# Patient Record
Sex: Male | Born: 1935 | Race: White | Hispanic: No | Smoking: Never smoker
Health system: Southern US, Community
[De-identification: ages and names within clinical notes are randomized; demographics above are authoritative.]

## PROBLEM LIST (undated history)

## (undated) ENCOUNTER — Emergency Department (HOSPITAL_COMMUNITY): Admission: EM | Payer: BLUE CROSS/BLUE SHIELD | Source: Home / Self Care

## (undated) DIAGNOSIS — E7800 Pure hypercholesterolemia, unspecified: Secondary | ICD-10-CM

## (undated) DIAGNOSIS — M86659 Other chronic osteomyelitis, unspecified thigh: Secondary | ICD-10-CM

## (undated) DIAGNOSIS — E119 Type 2 diabetes mellitus without complications: Secondary | ICD-10-CM

## (undated) DIAGNOSIS — H5712 Ocular pain, left eye: Secondary | ICD-10-CM

## (undated) DIAGNOSIS — M1990 Unspecified osteoarthritis, unspecified site: Secondary | ICD-10-CM

## (undated) DIAGNOSIS — T50905A Adverse effect of unspecified drugs, medicaments and biological substances, initial encounter: Secondary | ICD-10-CM

## (undated) DIAGNOSIS — E039 Hypothyroidism, unspecified: Secondary | ICD-10-CM

## (undated) DIAGNOSIS — M109 Gout, unspecified: Secondary | ICD-10-CM

## (undated) DIAGNOSIS — I4891 Unspecified atrial fibrillation: Secondary | ICD-10-CM

## (undated) DIAGNOSIS — K716 Toxic liver disease with hepatitis, not elsewhere classified: Secondary | ICD-10-CM

## (undated) DIAGNOSIS — K4090 Unilateral inguinal hernia, without obstruction or gangrene, not specified as recurrent: Secondary | ICD-10-CM

## (undated) DIAGNOSIS — F419 Anxiety disorder, unspecified: Secondary | ICD-10-CM

## (undated) DIAGNOSIS — C61 Malignant neoplasm of prostate: Secondary | ICD-10-CM

## (undated) HISTORY — DX: Pure hypercholesterolemia, unspecified: E78.00

## (undated) HISTORY — DX: Ocular pain, left eye: H57.12

## (undated) HISTORY — PX: HERNIA REPAIR: SHX51

## (undated) HISTORY — DX: Unspecified atrial fibrillation: I48.91

## (undated) HISTORY — DX: Toxic liver disease with hepatitis, not elsewhere classified: K71.6

## (undated) HISTORY — DX: Anxiety disorder, unspecified: F41.9

## (undated) HISTORY — DX: Malignant neoplasm of prostate: C61

## (undated) HISTORY — DX: Unspecified osteoarthritis, unspecified site: M19.90

## (undated) HISTORY — DX: Type 2 diabetes mellitus without complications: E11.9

## (undated) HISTORY — DX: Adverse effect of unspecified drugs, medicaments and biological substances, initial encounter: T50.905A

## (undated) HISTORY — PX: PROSTATE SURGERY: SHX751

## (undated) HISTORY — DX: Gout, unspecified: M10.9

## (undated) HISTORY — DX: Hypothyroidism, unspecified: E03.9

## (undated) HISTORY — DX: Other chronic osteomyelitis, unspecified thigh: M86.659

---

## 2001-10-17 ENCOUNTER — Ambulatory Visit (HOSPITAL_COMMUNITY): Admission: RE | Admit: 2001-10-17 | Discharge: 2001-10-17 | Payer: Self-pay | Admitting: Internal Medicine

## 2001-10-17 ENCOUNTER — Encounter: Payer: Self-pay | Admitting: Internal Medicine

## 2001-12-08 ENCOUNTER — Ambulatory Visit (HOSPITAL_COMMUNITY): Admission: RE | Admit: 2001-12-08 | Discharge: 2001-12-08 | Payer: Self-pay | Admitting: Internal Medicine

## 2003-04-29 ENCOUNTER — Ambulatory Visit (HOSPITAL_COMMUNITY): Admission: RE | Admit: 2003-04-29 | Discharge: 2003-04-29 | Payer: Self-pay | Admitting: Endocrinology

## 2007-09-26 ENCOUNTER — Ambulatory Visit (HOSPITAL_BASED_OUTPATIENT_CLINIC_OR_DEPARTMENT_OTHER): Admission: RE | Admit: 2007-09-26 | Discharge: 2007-09-26 | Payer: Self-pay | Admitting: Urology

## 2007-10-07 ENCOUNTER — Ambulatory Visit: Admission: RE | Admit: 2007-10-07 | Discharge: 2008-01-05 | Payer: Self-pay | Admitting: Radiation Oncology

## 2008-01-23 ENCOUNTER — Ambulatory Visit (HOSPITAL_BASED_OUTPATIENT_CLINIC_OR_DEPARTMENT_OTHER): Admission: RE | Admit: 2008-01-23 | Discharge: 2008-01-23 | Payer: Self-pay | Admitting: Urology

## 2008-02-05 ENCOUNTER — Ambulatory Visit: Admission: RE | Admit: 2008-02-05 | Discharge: 2008-03-14 | Payer: Self-pay | Admitting: Radiation Oncology

## 2010-01-24 ENCOUNTER — Observation Stay (HOSPITAL_COMMUNITY)
Admission: RE | Admit: 2010-01-24 | Discharge: 2010-01-25 | Payer: Self-pay | Source: Home / Self Care | Attending: General Surgery | Admitting: General Surgery

## 2010-05-02 LAB — GLUCOSE, CAPILLARY
Glucose-Capillary: 159 mg/dL — ABNORMAL HIGH (ref 70–99)
Glucose-Capillary: 212 mg/dL — ABNORMAL HIGH (ref 70–99)

## 2010-05-03 LAB — BASIC METABOLIC PANEL
CO2: 26 mEq/L (ref 19–32)
Calcium: 9.6 mg/dL (ref 8.4–10.5)
Creatinine, Ser: 0.89 mg/dL (ref 0.4–1.5)
GFR calc Af Amer: >60 mL/min (ref >60)
Glucose, Bld: 210 mg/dL — ABNORMAL HIGH (ref 70–99)

## 2010-05-03 LAB — DIFFERENTIAL
Basophils Absolute: 0 10*3/uL (ref 0.0–0.1)
Basophils Relative: 0 % (ref 0–1)
Eosinophils Absolute: 0.1 10*3/uL (ref 0.0–0.7)
Neutrophils Relative %: 72 % (ref 43–77)

## 2010-05-03 LAB — CBC
MCH: 27.9 pg (ref 26.0–34.0)
MCHC: 33 g/dL (ref 30.0–36.0)
Platelets: 250 10*3/uL (ref 150–400)
RBC: 4.12 MIL/uL — ABNORMAL LOW (ref 4.22–5.81)

## 2010-05-03 LAB — SURGICAL PCR SCREEN
MRSA, PCR: NEGATIVE
Staphylococcus aureus: POSITIVE — AB

## 2010-07-04 NOTE — Op Note (Signed)
NAME:  Anthony Dunlap, Anthony Dunlap                ACCOUNT NO.:  [id number]   MEDICAL RECORD NO.:  [id number]          PATIENT TYPE:  AMB   LOCATION:  [facility]                         FACILITY:  [facility]   PHYSICIAN:  Maretta Bees. Vonita Moss, M.D.DATE OF BIRTH:  1933-05-08   DATE OF PROCEDURE:  01/23/2008  DATE OF DISCHARGE:                               OPERATIVE REPORT   PREOPERATIVE DIAGNOSES:  Prostatic carcinoma and history of bulbous  urethral stricture.   POSTOPERATIVE DIAGNOSES:  Prostatic carcinoma and history of bulbous  urethral stricture.   PROCEDURE:  Radioactive seed implantation of prostate and cystoscopy.   SURGEON:  Maretta Bees. Vonita Moss, M.D.   ASSISTANT:  Artist Pais. Kathrynn Running, M.D.   ANESTHESIA:  General.   INDICATIONS:  This gentleman was found to have a Gleason 7 carcinoma  with a PSA of 6.63.  He was counseled about therapies and he opted for  radiation and has undergone gold seed implantation and subsequent  external beam radiation.  He now presents for radioactive seed  implantation for a combined radiotherapy.   PROCEDURE:  The patient was brought to the operating room, placed in  lithotomy position, and rectal tube, Foley catheter and transrectal  ultrasound probe and he underwent treatment planning.  After the  treatment planning was complete, he underwent placement of 26 activated  needles with a total of 78 seeds placed under ultrasonic direction.  At  the end of the case I cystoscoped him and there were 2 radioactive seeds  floating free in the bladder which were retrieved with the grasping  forceps.  There was no significant bleeding or bladder or urethral  injury.  Foley catheter was inserted and was connected to closed  drainage with clear irrigation with clear return of urine and he was  taken to the recovery room in good condition, having tolerated the  procedure well.      Maretta Bees. Vonita Moss, M.D.  Electronically Signed     LJP/MEDQ  D:  01/23/2008  T:  01/23/2008   Job:  [id number]   cc:   Artist Pais Kathrynn Running, M.D.  Fax: [phone]

## 2010-07-04 NOTE — Op Note (Signed)
NAME:  Anthony Dunlap, Anthony Dunlap                ACCOUNT NO.:  [id number]   MEDICAL RECORD NO.:  [id number]          PATIENT TYPE:  AMB   LOCATION:  [facility]                         FACILITY:  [facility]   PHYSICIAN:  Maretta Bees. Vonita Moss, M.D.DATE OF BIRTH:  06-12-1933   DATE OF PROCEDURE:  09/26/2007  DATE OF DISCHARGE:                               OPERATIVE REPORT   PREOPERATIVE DIAGNOSIS:  Microhematuria and urethral stricture.   POSTOPERATIVE DIAGNOSIS:  Microhematuria and urethral stricture.   PROCEDURE:  Cystoscopy and dilation of deep bulbous urethral stricture  with filiforms and followers.   SURGEON:  Maretta Bees. Vonita Moss, M.D.   ANESTHESIA:  General.   INDICATIONS:  This gentleman has been worked up for elevated PSA and  subsequent diagnosis of prostate cancer and also is undergoing workup  for microhematuria.  He was cystoscoped in the office and he had a dense  bulbous urethral stricture and that may well be related to a TUR of the  prostate that he had in [place], [place], 10 or 15 years ago.  He needs dilation of this stricture and cystoscopy to rule out any  bladder lesions.  He has already had an IV contrasted CT that showed no  serious abnormalities.  He did have bilateral renal cysts.   PROCEDURE IN DETAIL:  The patient was brought to the operating room and  placed in lithotomy position.  External genitalia were prepped and  draped in usual fashion.  He was cystoscoped.  The anterior urethra was  normal until it was noted very pinpoint bulbous urethral stricture in  the deep bulbous urethra.  A retractable core guidewire would not pass  easily but a Glidewire did.  Over the Glidewire I easily dilated a thin  bulbous urethral stricture from 16 to 28 [place].  I then cystoscoped him  and the stricture was wide open.  Prostate was typical evidence of  previous TUR of the prostate as noted above.  There was some tissue on  the floor from 6 o'clock to 9 o'clock that I did not  believe was  significantly obstructing.  The bladder had trabeculation but no stones,  tumors or inflammatory lesions were noted.  The bladder was emptied and  the scope removed.  The patient was sent to the recovery room in good  condition having tolerated the procedure well.      Maretta Bees. Vonita Moss, M.D.  Electronically Signed     LJP/MEDQ  D:  09/26/2007  T:  09/26/2007  Job:  [id number]

## 2010-07-07 NOTE — Op Note (Signed)
NAME:  Anthony Dunlap, Anthony Dunlap                          ACCOUNT NO.:  [id number]   MEDICAL RECORD NO.:  [id number]                   PATIENT TYPE:  AMB   LOCATION:  DAY                                  FACILITY:  [facility]   PHYSICIAN:  Lionel December, M.D.                 DATE OF BIRTH:  [birth year]   DATE OF PROCEDURE:  12/08/2001  DATE OF DISCHARGE:                                 OPERATIVE REPORT   PROCEDURE:  Total colonoscopy.   INDICATIONS:  The patient is a 77 year old Caucasian male who is undergoing  screening colonoscopy.  Family history is negative for colorectal carcinoma.  This is his first screening exam.   The procedure is reviewed with the patient, and informed consent was  obtained.   PREMEDICATION:  Demerol 50 mg IV, Versed 4 mg IV in divided dose.   ENDOSCOPE:  Olympus video system.   FINDINGS:  Procedure performed in endoscopy suite.  Patient's vital signs  and O2 saturation were monitored during the procedure and remained stable.  The patient was placed in the left lateral position and rectal examination  performed.  This exam was normal.  The scope was placed in the rectum and  advanced to the region of the sigmoid colon and beyond.  Preoperative  preparation satisfactory.  The scope was passed into the cecum, which was  identified by appendiceal orifice and ileocecal valve.  Pictures were taken  for the record.  As the scope was withdrawn, colonic mucosa was carefully  examined.  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  There was a 3-4 mm polyp at the proximal transverse colon, which was ablated  by cold biopsy.  Mucosa of the rest of the colon.  Rectal mucosa similarly  was normal.  The scope was retroflexed, examined the anorectal junction, and  hemorrhoids were noted below the dentate line.  The endoscope was  straightened and withdrawn.  The patient tolerated the procedure well.   FINAL DIAGNOSES:  1. Examination performed to the cecum.  2. Mild changes of melanosis coli.  3. Single small polyp ablated by cold biopsy  from proximal transverse     colon.  4. Small external hemorrhoids.   RECOMMENDATIONS:  1. Standard instructions given.  2.     I will be contacting the patient  with biopsy results and further     recommendations.  3. A high-fiber diet, Citrucel one tablespoonful daily, and Colace two     tablets at bedtime.  If this combination does not alleviate his     constipation, would consider starting on lactulose.                                               Lionel December, M.D.    NR/MEDQ  D:  12/08/2001  T:  12/08/2001  Job:  [id number]   cc:   Madelin Rear. Sherwood Gambler, M.D.  [address]  [place]  [place] [address]  Fax: [phone]

## 2010-11-21 LAB — COMPREHENSIVE METABOLIC PANEL
ALT: 14 U/L (ref 0–53)
CO2: 30 mEq/L (ref 19–32)
Calcium: 9.2 mg/dL (ref 8.4–10.5)
Creatinine, Ser: 0.69 mg/dL (ref 0.4–1.5)
GFR calc non Af Amer: >60 mL/min (ref >60)
Glucose, Bld: 246 mg/dL — ABNORMAL HIGH (ref 70–99)
Total Bilirubin: 0.6 mg/dL (ref 0.3–1.2)

## 2010-11-21 LAB — PROTIME-INR: Prothrombin Time: 12.6 seconds (ref 11.6–15.2)

## 2010-11-21 LAB — CBC
Hemoglobin: 12.6 g/dL — ABNORMAL LOW (ref 13.0–17.0)
MCHC: 33.3 g/dL (ref 30.0–36.0)
MCV: 86.9 fL (ref 78.0–100.0)
RBC: 4.37 MIL/uL (ref 4.22–5.81)

## 2010-11-24 LAB — GLUCOSE, CAPILLARY: Glucose-Capillary: 195 mg/dL — ABNORMAL HIGH (ref 70–99)

## 2010-12-15 ENCOUNTER — Ambulatory Visit (INDEPENDENT_AMBULATORY_CARE_PROVIDER_SITE_OTHER): Payer: Medicare Other | Admitting: Urology

## 2010-12-15 DIAGNOSIS — R351 Nocturia: Secondary | ICD-10-CM

## 2010-12-15 DIAGNOSIS — N3946 Mixed incontinence: Secondary | ICD-10-CM

## 2010-12-15 DIAGNOSIS — Z8546 Personal history of malignant neoplasm of prostate: Secondary | ICD-10-CM

## 2010-12-15 DIAGNOSIS — R81 Glycosuria: Secondary | ICD-10-CM

## 2011-07-27 ENCOUNTER — Ambulatory Visit (INDEPENDENT_AMBULATORY_CARE_PROVIDER_SITE_OTHER): Payer: Medicare Other | Admitting: Urology

## 2011-07-27 DIAGNOSIS — Z8546 Personal history of malignant neoplasm of prostate: Secondary | ICD-10-CM

## 2011-07-27 DIAGNOSIS — N3941 Urge incontinence: Secondary | ICD-10-CM

## 2011-11-28 ENCOUNTER — Encounter (INDEPENDENT_AMBULATORY_CARE_PROVIDER_SITE_OTHER): Payer: Self-pay | Admitting: *Deleted

## 2012-01-25 ENCOUNTER — Ambulatory Visit (INDEPENDENT_AMBULATORY_CARE_PROVIDER_SITE_OTHER): Payer: Medicare Other | Admitting: Urology

## 2012-01-25 DIAGNOSIS — N393 Stress incontinence (female) (male): Secondary | ICD-10-CM

## 2012-01-25 DIAGNOSIS — N3941 Urge incontinence: Secondary | ICD-10-CM

## 2012-01-25 DIAGNOSIS — Z8546 Personal history of malignant neoplasm of prostate: Secondary | ICD-10-CM

## 2012-01-25 DIAGNOSIS — N529 Male erectile dysfunction, unspecified: Secondary | ICD-10-CM

## 2013-01-30 ENCOUNTER — Ambulatory Visit: Payer: Medicare Other | Admitting: Urology

## 2013-03-06 ENCOUNTER — Ambulatory Visit (INDEPENDENT_AMBULATORY_CARE_PROVIDER_SITE_OTHER): Payer: Medicare Other | Admitting: Urology

## 2013-03-06 DIAGNOSIS — N393 Stress incontinence (female) (male): Secondary | ICD-10-CM

## 2013-03-06 DIAGNOSIS — Z8546 Personal history of malignant neoplasm of prostate: Secondary | ICD-10-CM

## 2013-03-06 DIAGNOSIS — N471 Phimosis: Secondary | ICD-10-CM

## 2013-03-06 DIAGNOSIS — N478 Other disorders of prepuce: Secondary | ICD-10-CM

## 2013-03-06 DIAGNOSIS — N529 Male erectile dysfunction, unspecified: Secondary | ICD-10-CM

## 2013-07-21 ENCOUNTER — Encounter (INDEPENDENT_AMBULATORY_CARE_PROVIDER_SITE_OTHER): Payer: Self-pay | Admitting: *Deleted

## 2013-08-04 ENCOUNTER — Encounter (INDEPENDENT_AMBULATORY_CARE_PROVIDER_SITE_OTHER): Payer: Self-pay | Admitting: *Deleted

## 2013-08-19 ENCOUNTER — Encounter (INDEPENDENT_AMBULATORY_CARE_PROVIDER_SITE_OTHER): Payer: Self-pay

## 2013-08-19 ENCOUNTER — Other Ambulatory Visit (INDEPENDENT_AMBULATORY_CARE_PROVIDER_SITE_OTHER): Payer: Self-pay

## 2013-08-19 ENCOUNTER — Encounter: Payer: Self-pay | Admitting: Neurology

## 2013-08-19 ENCOUNTER — Ambulatory Visit (INDEPENDENT_AMBULATORY_CARE_PROVIDER_SITE_OTHER): Payer: Medicare Other | Admitting: Neurology

## 2013-08-19 VITALS — BP 143/89 | HR 85 | Ht 69.0 in | Wt 151.0 lb

## 2013-08-19 DIAGNOSIS — E785 Hyperlipidemia, unspecified: Secondary | ICD-10-CM

## 2013-08-19 DIAGNOSIS — C61 Malignant neoplasm of prostate: Secondary | ICD-10-CM | POA: Insufficient documentation

## 2013-08-19 DIAGNOSIS — H4920 Sixth [abducent] nerve palsy, unspecified eye: Secondary | ICD-10-CM

## 2013-08-19 DIAGNOSIS — H4922 Sixth [abducent] nerve palsy, left eye: Secondary | ICD-10-CM | POA: Insufficient documentation

## 2013-08-19 DIAGNOSIS — Z0289 Encounter for other administrative examinations: Secondary | ICD-10-CM

## 2013-08-19 DIAGNOSIS — E119 Type 2 diabetes mellitus without complications: Secondary | ICD-10-CM | POA: Insufficient documentation
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
## 2013-08-20 ENCOUNTER — Ambulatory Visit
Admission: RE | Admit: 2013-08-20 | Discharge: 2013-08-20 | Disposition: A | Discharge status: Home / Self Care | Payer: PRIVATE HEALTH INSURANCE | Source: Ambulatory Visit | Attending: Neurology | Admitting: Neurology

## 2013-08-20 DIAGNOSIS — H5310 Unspecified subjective visual disturbances: Secondary | ICD-10-CM

## 2013-08-20 DIAGNOSIS — E785 Hyperlipidemia, unspecified: Secondary | ICD-10-CM

## 2013-08-20 DIAGNOSIS — H4922 Sixth [abducent] nerve palsy, left eye: Secondary | ICD-10-CM

## 2013-08-24 ENCOUNTER — Telehealth: Payer: Self-pay | Admitting: Neurology

## 2013-08-24 NOTE — Telephone Encounter (Signed)
Wife [name] can be reached at [phone].

## 2013-08-24 NOTE — Telephone Encounter (Signed)
Daughter returning Anthony Dunlap's call from this am, regarding MRI results.

## 2013-08-24 NOTE — Telephone Encounter (Signed)
Called pt's wife informing her of the pt's lab work results. Wife verbalized understanding.

## 2013-08-24 NOTE — Telephone Encounter (Signed)
Patient's wife returning Cathy's call, please return call, also wants to know if he can have his lab work done in [place] instead of [place].

## 2013-08-25 NOTE — Telephone Encounter (Signed)
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

## 2013-08-26 ENCOUNTER — Ambulatory Visit (INDEPENDENT_AMBULATORY_CARE_PROVIDER_SITE_OTHER): Payer: Medicare Other | Admitting: Internal Medicine

## 2013-08-26 LAB — ANA W/REFLEX IF POSITIVE: Anti Nuclear Antibody(ANA): NEGATIVE

## 2013-08-26 LAB — C-REACTIVE PROTEIN: CRP: 0.4 mg/L (ref 0.0–4.9)

## 2013-08-26 LAB — THYROID PANEL WITH TSH
Free Thyroxine Index: 2.3 (ref 1.2–4.9)
T3 Uptake Ratio: 31 % (ref 24–39)
T4, Total: 7.4 ug/dL (ref 4.5–12.0)
TSH: 3.17 u[IU]/mL (ref 0.450–4.500)

## 2013-08-26 LAB — RPR: SYPHILIS RPR SCR: NONREACTIVE

## 2013-08-26 LAB — VITAMIN B12: VITAMIN B 12: 403 pg/mL (ref 211–946)

## 2013-08-26 LAB — FOLATE: FOLATE: 18.2 ng/mL (ref >3.0)

## 2013-08-26 LAB — ACETYLCHOLINE RECEPTOR, MODULATING

## 2013-08-26 LAB — ACETYLCHOLINE RECEPTOR, BINDING: ACHR BINDING AB, SERUM: 0.03 nmol/L (ref 0.00–0.24)

## 2013-08-26 NOTE — Telephone Encounter (Signed)
Patient's wife calling back about lab work--has questions--please call.

## 2013-08-26 NOTE — Telephone Encounter (Signed)
Called pt and spoke with pt's wife Anthony Dunlap informing her per Dr. Krista Blue that the pt's MRI of the brain showed small vessel disease, no acute lesions, and that he should keep his f/u appt in August. I advised the wife that if the pt has any other problems, questions or concerns to call the office. Wife verbalized understanding.

## 2013-08-27 ENCOUNTER — Other Ambulatory Visit: Payer: Medicare Other

## 2013-08-27 ENCOUNTER — Ambulatory Visit (INDEPENDENT_AMBULATORY_CARE_PROVIDER_SITE_OTHER): Payer: Medicare Other

## 2013-08-27 DIAGNOSIS — H4922 Sixth [abducent] nerve palsy, left eye: Secondary | ICD-10-CM

## 2013-08-27 DIAGNOSIS — H5310 Unspecified subjective visual disturbances: Secondary | ICD-10-CM

## 2013-08-27 DIAGNOSIS — E785 Hyperlipidemia, unspecified: Secondary | ICD-10-CM

## 2013-09-02 ENCOUNTER — Emergency Department (HOSPITAL_COMMUNITY)
Admission: EM | Admit: 2013-09-02 | Discharge: 2013-09-03 | Disposition: A | Discharge status: Home / Self Care | Payer: Medicare Other | Attending: Emergency Medicine | Admitting: Emergency Medicine

## 2013-09-02 ENCOUNTER — Encounter (HOSPITAL_COMMUNITY): Payer: Self-pay | Admitting: Emergency Medicine

## 2013-09-02 DIAGNOSIS — R739 Hyperglycemia, unspecified: Secondary | ICD-10-CM

## 2013-09-02 DIAGNOSIS — Z7982 Long term (current) use of aspirin: Secondary | ICD-10-CM | POA: Insufficient documentation

## 2013-09-02 DIAGNOSIS — H4920 Sixth [abducent] nerve palsy, unspecified eye: Secondary | ICD-10-CM | POA: Insufficient documentation

## 2013-09-02 DIAGNOSIS — Z8546 Personal history of malignant neoplasm of prostate: Secondary | ICD-10-CM | POA: Insufficient documentation

## 2013-09-02 DIAGNOSIS — N390 Urinary tract infection, site not specified: Secondary | ICD-10-CM | POA: Insufficient documentation

## 2013-09-02 DIAGNOSIS — E119 Type 2 diabetes mellitus without complications: Secondary | ICD-10-CM | POA: Insufficient documentation

## 2013-09-02 DIAGNOSIS — Z794 Long term (current) use of insulin: Secondary | ICD-10-CM | POA: Insufficient documentation

## 2013-09-02 LAB — CBC WITH DIFFERENTIAL/PLATELET
BASOS ABS: 0 10*3/uL (ref 0.0–0.1)
Basophils Relative: 0 % (ref 0–1)
Eosinophils Absolute: 0.2 10*3/uL (ref 0.0–0.7)
Eosinophils Relative: 3 % (ref 0–5)
HCT: 35.4 % — ABNORMAL LOW (ref 39.0–52.0)
Hemoglobin: 11.8 g/dL — ABNORMAL LOW (ref 13.0–17.0)
LYMPHS ABS: 1 10*3/uL (ref 0.7–4.0)
LYMPHS PCT: 21 % (ref 12–46)
MCH: 28.9 pg (ref 26.0–34.0)
MCHC: 33.3 g/dL (ref 30.0–36.0)
MCV: 86.6 fL (ref 78.0–100.0)
Monocytes Absolute: 0.4 10*3/uL (ref 0.1–1.0)
Monocytes Relative: 9 % (ref 3–12)
NEUTROS ABS: 3.4 10*3/uL (ref 1.7–7.7)
Neutrophils Relative %: 67 % (ref 43–77)
PLATELETS: 193 10*3/uL (ref 150–400)
RBC: 4.09 MIL/uL — AB (ref 4.22–5.81)
RDW: 13 % (ref 11.5–15.5)
WBC: 5 10*3/uL (ref 4.0–10.5)

## 2013-09-02 LAB — BASIC METABOLIC PANEL
ANION GAP: 10 (ref 5–15)
BUN: 26 mg/dL — ABNORMAL HIGH (ref 6–23)
CHLORIDE: 98 meq/L (ref 96–112)
CO2: 27 meq/L (ref 19–32)
Calcium: 8.9 mg/dL (ref 8.4–10.5)
Creatinine, Ser: 1.04 mg/dL (ref 0.50–1.35)
GFR calc Af Amer: 77 mL/min — ABNORMAL LOW (ref >90)
GFR calc non Af Amer: 67 mL/min — ABNORMAL LOW (ref >90)
Glucose, Bld: 538 mg/dL — ABNORMAL HIGH (ref 70–99)
POTASSIUM: 5.2 meq/L (ref 3.7–5.3)
SODIUM: 135 meq/L — AB (ref 137–147)

## 2013-09-02 MED ORDER — SODIUM CHLORIDE 0.9 % IV SOLN: 1000.0000 mL | INTRAVENOUS | Status: DC

## 2013-09-02 MED ORDER — SODIUM CHLORIDE 0.9 % IV SOLN
1000.0000 mL | Freq: Once | INTRAVENOUS | Status: AC
  Administered 2013-09-02: 1000 mL via INTRAVENOUS

## 2013-09-02 NOTE — ED Notes (Signed)
Patient presents tonight via [facility] after his blood sugar at home read HI.  Patient denies pain or any other complaints.  States he took insulin 20 units SQ about an hour ago.  A&O; skin w/d. Respirations even and unlabored; able to speak in complete sentences without difficulty.
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
## 2013-09-02 NOTE — ED Notes (Signed)
cbg 534

## 2013-09-02 NOTE — ED Notes (Signed)
Per EMS, patient doesn't check his blood sugar or take his insulin like he's supposed to.  Patient checked CBG and it read hi so he took Insulin 20 units SQ.  Patient denies pain.

## 2013-09-03 LAB — URINALYSIS, ROUTINE W REFLEX MICROSCOPIC
Bilirubin Urine: NEGATIVE
Glucose, UA: 250 mg/dL — AB
Hgb urine dipstick: NEGATIVE
KETONES UR: NEGATIVE mg/dL
NITRITE: NEGATIVE
PH: 5.5 (ref 5.0–8.0)
Protein, ur: NEGATIVE mg/dL
Urobilinogen, UA: 0.2 mg/dL (ref 0.0–1.0)

## 2013-09-03 LAB — URINE MICROSCOPIC-ADD ON

## 2013-09-03 LAB — CBG MONITORING, ED
GLUCOSE-CAPILLARY: 534 mg/dL — AB (ref 70–99)
GLUCOSE-CAPILLARY: 386 mg/dL — AB (ref 70–99)
GLUCOSE-CAPILLARY: 199 mg/dL — AB (ref 70–99)

## 2013-09-03 MED ORDER — DEXTROSE 5 % IV SOLN
1.0000 g | Freq: Once | INTRAVENOUS | Status: AC
  Administered 2013-09-03: 1 g via INTRAVENOUS
  Filled 2013-09-03: qty 10

## 2013-09-03 MED ORDER — INSULIN ASPART 100 UNIT/ML ~~LOC~~ SOLN
7.0000 [IU] | Freq: Once | SUBCUTANEOUS | Status: AC
  Administered 2013-09-03: 7 [IU] via INTRAVENOUS
  Filled 2013-09-03: qty 1

## 2013-09-03 MED ORDER — CEPHALEXIN 500 MG PO CAPS: 500.0000 mg | ORAL_CAPSULE | Freq: Four times a day (QID) | ORAL | Status: DC

## 2013-09-03 NOTE — Discharge Instructions (Signed)
Urinary Tract Infection Urinary tract infections (UTIs) can develop anywhere along your urinary tract. Your urinary tract is your body's drainage system for removing wastes and extra water. Your urinary tract includes two kidneys, two ureters, a bladder, and a urethra. Your kidneys are a pair of bean-shaped organs. Each kidney is about the size of your fist. They are located below your ribs, one on each side of your spine. CAUSES Infections are caused by microbes, which are microscopic organisms, including fungi, viruses, and bacteria. These organisms are so small that they can only be seen through a microscope. Bacteria are the microbes that most commonly cause UTIs. SYMPTOMS  Symptoms of UTIs may vary by age and gender of the patient and by the location of the infection. Symptoms in young women typically include a frequent and intense urge to urinate and a painful, burning feeling in the bladder or urethra during urination. Older women and men are more likely to be tired, shaky, and weak and have muscle aches and abdominal pain. A fever may mean the infection is in your kidneys. Other symptoms of a kidney infection include pain in your back or sides below the ribs, nausea, and vomiting. DIAGNOSIS To diagnose a UTI, your caregiver will ask you about your symptoms. Your caregiver also will ask to provide a urine sample. The urine sample will be tested for bacteria and white blood cells. White blood cells are made by your body to help fight infection. TREATMENT  Typically, UTIs can be treated with medication. Because most UTIs are caused by a bacterial infection, they usually can be treated with the use of antibiotics. The choice of antibiotic and length of treatment depend on your symptoms and the type of bacteria causing your infection. HOME CARE INSTRUCTIONS  If you were prescribed antibiotics, take them exactly as your caregiver instructs you. Finish the medication even if you feel better after you  have only taken some of the medication.  Drink enough water and fluids to keep your urine clear or pale yellow.  Avoid caffeine, tea, and carbonated beverages. They tend to irritate your bladder.  Empty your bladder often. Avoid holding urine for long periods of time.  Empty your bladder before and after sexual intercourse.  After a bowel movement, women should cleanse from front to back. Use each tissue only once. SEEK MEDICAL CARE IF:   You have back pain.  You develop a fever.  Your symptoms do not begin to resolve within 3 days. SEEK IMMEDIATE MEDICAL CARE IF:   You have severe back pain or lower abdominal pain.  You develop chills.  You have nausea or vomiting.  You have continued burning or discomfort with urination. MAKE SURE YOU:   Understand these instructions.  Will watch your condition.  Will get help right away if you are not doing well or get worse. Document Released: 11/15/2004 Document Revised: 08/07/2011 Document Reviewed: 03/16/2011 [facility] Patient Information 2015 [place], [place]. This information is not intended to replace advice given to you by your health care provider. Make sure you discuss any questions you have with your health care provider.  Hyperglycemia Hyperglycemia occurs when the glucose (sugar) in your blood is too high. Hyperglycemia can happen for many reasons, but it most often happens to people who do not know they have diabetes or are not managing their diabetes properly.  CAUSES  Whether you have diabetes or not, there are other causes of hyperglycemia. Hyperglycemia can occur when you have diabetes, but it can also  occur in other situations that you might not be as aware of, such as: Diabetes  If you have diabetes and are having problems controlling your blood glucose, hyperglycemia could occur because of some of the following reasons:  Not following your meal plan.  Not taking your diabetes medications or not taking it  properly.  Exercising less or doing less activity than you normally do.  Being sick. Pre-diabetes  This cannot be ignored. Before people develop Type 2 diabetes, they almost always have "pre-diabetes." This is when your blood glucose levels are higher than normal, but not yet high enough to be diagnosed as diabetes. Research has shown that some long-term damage to the body, especially the heart and circulatory system, may already be occurring during pre-diabetes. If you take action to manage your blood glucose when you have pre-diabetes, you may delay or prevent Type 2 diabetes from developing. Stress  If you have diabetes, you may be "diet" controlled or on oral medications or insulin to control your diabetes. However, you may find that your blood glucose is higher than usual in the hospital whether you have diabetes or not. This is often referred to as "stress hyperglycemia." Stress can elevate your blood glucose. This happens because of hormones put out by the body during times of stress. If stress has been the cause of your high blood glucose, it can be followed regularly by your caregiver. That way he/she can make sure your hyperglycemia does not continue to get worse or progress to diabetes. Steroids  Steroids are medications that act on the infection fighting system (immune system) to block inflammation or infection. One side effect can be a rise in blood glucose. Most people can produce enough extra insulin to allow for this rise, but for those who cannot, steroids make blood glucose levels go even higher. It is not unusual for steroid treatments to "uncover" diabetes that is developing. It is not always possible to determine if the hyperglycemia will go away after the steroids are stopped. A special blood test called an A1c is sometimes done to determine if your blood glucose was elevated before the steroids were started. SYMPTOMS  Thirsty.  Frequent urination.  Dry mouth.  Blurred  vision.  Tired or fatigue.  Weakness.  Sleepy.  Tingling in feet or leg. DIAGNOSIS  Diagnosis is made by monitoring blood glucose in one or all of the following ways:  A1c test. This is a chemical found in your blood.  Fingerstick blood glucose monitoring.  Laboratory results. TREATMENT  First, knowing the cause of the hyperglycemia is important before the hyperglycemia can be treated. Treatment may include, but is not be limited to:  Education.  Change or adjustment in medications.  Change or adjustment in meal plan.  Treatment for an illness, infection, etc.  More frequent blood glucose monitoring.  Change in exercise plan.  Decreasing or stopping steroids.  Lifestyle changes. HOME CARE INSTRUCTIONS   Test your blood glucose as directed.  Exercise regularly. Your caregiver will give you instructions about exercise. Pre-diabetes or diabetes which comes on with stress is helped by exercising.  Eat wholesome, balanced meals. Eat often and at regular, fixed times. Your caregiver or nutritionist will give you a meal plan to guide your sugar intake.  Being at an ideal weight is important. If needed, losing as little as 10 to 15 pounds may help improve blood glucose levels. SEEK MEDICAL CARE IF:   You have questions about medicine, activity, or diet.  You continue  to have symptoms (problems such as increased thirst, urination, or weight gain). SEEK IMMEDIATE MEDICAL CARE IF:   You are vomiting or have diarrhea.  Your breath smells fruity.  You are breathing faster or slower.  You are very sleepy or incoherent.  You have numbness, tingling, or pain in your feet or hands.  You have chest pain.  Your symptoms get worse even though you have been following your caregiver's orders.  If you have any other questions or concerns. Document Released: 08/01/2000 Document Revised: 04/30/2011 Document Reviewed: 06/04/2011 [facility] Patient Information 2015 [place],  [place]. This information is not intended to replace advice given to you by your health care provider. Make sure you discuss any questions you have with your health care provider.  Cephalexin tablets or capsules What is this medicine? CEPHALEXIN (sef a LEX in) is a cephalosporin antibiotic. It is used to treat certain kinds of bacterial infections It will not work for colds, flu, or other viral infections. This medicine may be used for other purposes; ask your health care provider or pharmacist if you have questions. COMMON BRAND NAME(S): Biocef, Keflex, Keftab What should I tell my health care provider before I take this medicine? They need to know if you have any of these conditions: -kidney disease -stomach or intestine problems, especially colitis -an unusual or allergic reaction to cephalexin, other cephalosporins, penicillins, other antibiotics, medicines, foods, dyes or preservatives -pregnant or trying to get pregnant -breast-feeding How should I use this medicine? Take this medicine by mouth with a full glass of water. Follow the directions on the prescription label. This medicine can be taken with or without food. Take your medicine at regular intervals. Do not take your medicine more often than directed. Take all of your medicine as directed even if you think you are better. Do not skip doses or stop your medicine early. Talk to your pediatrician regarding the use of this medicine in children. While this drug may be prescribed for selected conditions, precautions do apply. Overdosage: If you think you have taken too much of this medicine contact a poison control center or emergency room at once. NOTE: This medicine is only for you. Do not share this medicine with others. What if I miss a dose? If you miss a dose, take it as soon as you can. If it is almost time for your next dose, take only that dose. Do not take double or extra doses. There should be at least 4 to 6 hours between  doses. What may interact with this medicine? -probenecid -some other antibiotics This list may not describe all possible interactions. Give your health care provider a list of all the medicines, herbs, non-prescription drugs, or dietary supplements you use. Also tell them if you smoke, drink alcohol, or use illegal drugs. Some items may interact with your medicine. What should I watch for while using this medicine? Tell your doctor or health care professional if your symptoms do not begin to improve in a few days. Do not treat diarrhea with over the counter products. Contact your doctor if you have diarrhea that lasts more than 2 days or if it is severe and watery. If you have diabetes, you may get a false-positive result for sugar in your urine. Check with your doctor or health care professional. What side effects may I notice from receiving this medicine? Side effects that you should report to your doctor or health care professional as soon as possible: -allergic reactions like skin rash, itching or  hives, swelling of the face, lips, or tongue -breathing problems -pain or trouble passing urine -redness, blistering, peeling or loosening of the skin, including inside the mouth -severe or watery diarrhea -unusually weak or tired -yellowing of the eyes, skin Side effects that usually do not require medical attention (report to your doctor or health care professional if they continue or are bothersome): -gas or heartburn -genital or anal irritation -headache -joint or muscle pain -nausea, vomiting This list may not describe all possible side effects. Call your doctor for medical advice about side effects. You may report side effects to FDA at 1-800-FDA-1088. Where should I keep my medicine? Keep out of the reach of children. Store at room temperature between 59 and 86 degrees F (15 and 30 degrees C). Throw away any unused medicine after the expiration date. NOTE: This sheet is a summary. It  may not cover all possible information. If you have questions about this medicine, talk to your doctor, pharmacist, or health care provider.  2015, [employer]. (2007-05-12 17:09:13)

## 2013-09-03 NOTE — ED Notes (Signed)
Patient continues to deny pain at this time.  Rocephin infusing without difficulty; patient to be discharged after IV antibiotic completed.

## 2013-09-04 ENCOUNTER — Telehealth: Payer: Self-pay | Admitting: Neurology

## 2013-09-04 NOTE — Telephone Encounter (Signed)
Cathy: Please call patient, ultrasound of carotid arteries showed no significant stenosis, there was evidence of atherosclerotic disease, homogeneous plaque, keep daily asa.

## 2013-09-04 NOTE — Telephone Encounter (Signed)
Pt calling requesting carotid doppler results. Please advise

## 2013-09-04 NOTE — Telephone Encounter (Signed)
Spouse requesting Carotid doppler results.  Please call and advise.  Thanks

## 2013-09-06 LAB — URINE CULTURE: Colony Count: >=100000

## 2013-09-07 ENCOUNTER — Telehealth (HOSPITAL_COMMUNITY): Payer: Self-pay

## 2013-09-07 NOTE — ED Notes (Signed)
Post ED Visit - Positive Culture Follow-up  Culture report reviewed by antimicrobial stewardship pharmacist: []  Wes Dulaney, Pharm.D., BCPS [x]  Heide Guile, Pharm.D., BCPS []  Alycia Rossetti, Pharm.D., BCPS []  [place], [place].D., BCPS, AAHIVP []  Legrand Como, Pharm.D., BCPS, AAHIVP []    Positive urine culture Treated with cephalexin, organism sensitive to the same and no further patient follow-up is required at this time.  Ileene Musa 09/07/2013, 10:43 AM

## 2013-09-07 NOTE — Telephone Encounter (Signed)
Called pt and spoke with pt's wife [name] and informed her per Dr. Krista Blue that the pt's ultrasound of carotid arteries showed no significant stenosis and there was evidence of atherosclerotic disease, homogeneous plaque and to keep daily aspirin. I advised the wife that if the pt has any other problems, questions or concerns to call the office. Wife verbalized understanding.

## 2013-09-21 ENCOUNTER — Ambulatory Visit (INDEPENDENT_AMBULATORY_CARE_PROVIDER_SITE_OTHER): Payer: Medicare Other | Admitting: Neurology

## 2013-09-21 ENCOUNTER — Encounter: Payer: Self-pay | Admitting: Neurology

## 2013-09-21 VITALS — BP 126/76 | HR 94 | Ht 69.0 in | Wt 149.0 lb

## 2013-09-21 DIAGNOSIS — E1365 Other specified diabetes mellitus with hyperglycemia: Secondary | ICD-10-CM

## 2013-09-21 DIAGNOSIS — H4920 Sixth [abducent] nerve palsy, unspecified eye: Secondary | ICD-10-CM

## 2013-09-21 DIAGNOSIS — E119 Type 2 diabetes mellitus without complications: Secondary | ICD-10-CM

## 2013-09-21 DIAGNOSIS — H4922 Sixth [abducent] nerve palsy, left eye: Secondary | ICD-10-CM

## 2013-09-21 DIAGNOSIS — E785 Hyperlipidemia, unspecified: Secondary | ICD-10-CM

## 2013-09-21 DIAGNOSIS — C61 Malignant neoplasm of prostate: Secondary | ICD-10-CM
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
## 2013-09-24 NOTE — Telephone Encounter (Signed)
Noted  

## 2014-03-19 ENCOUNTER — Ambulatory Visit (INDEPENDENT_AMBULATORY_CARE_PROVIDER_SITE_OTHER): Payer: Medicare Other | Admitting: Urology

## 2014-03-19 DIAGNOSIS — N393 Stress incontinence (female) (male): Secondary | ICD-10-CM

## 2014-03-19 DIAGNOSIS — Z8546 Personal history of malignant neoplasm of prostate: Secondary | ICD-10-CM

## 2014-03-19 DIAGNOSIS — N3941 Urge incontinence: Secondary | ICD-10-CM

## 2014-03-19 DIAGNOSIS — N5201 Erectile dysfunction due to arterial insufficiency: Secondary | ICD-10-CM

## 2014-10-19 ENCOUNTER — Ambulatory Visit (INDEPENDENT_AMBULATORY_CARE_PROVIDER_SITE_OTHER): Payer: Medicare Other | Admitting: Urology

## 2014-10-19 DIAGNOSIS — N390 Urinary tract infection, site not specified: Secondary | ICD-10-CM | POA: Diagnosis not present

## 2015-03-25 ENCOUNTER — Ambulatory Visit (INDEPENDENT_AMBULATORY_CARE_PROVIDER_SITE_OTHER): Payer: Medicare Other | Admitting: Urology

## 2015-03-25 DIAGNOSIS — N393 Stress incontinence (female) (male): Secondary | ICD-10-CM | POA: Diagnosis not present

## 2015-03-25 DIAGNOSIS — N5201 Erectile dysfunction due to arterial insufficiency: Secondary | ICD-10-CM | POA: Diagnosis not present

## 2015-03-25 DIAGNOSIS — Z8546 Personal history of malignant neoplasm of prostate: Secondary | ICD-10-CM

## 2015-03-25 DIAGNOSIS — N471 Phimosis: Secondary | ICD-10-CM

## 2015-03-25 DIAGNOSIS — N390 Urinary tract infection, site not specified: Secondary | ICD-10-CM

## 2016-04-20 ENCOUNTER — Ambulatory Visit (INDEPENDENT_AMBULATORY_CARE_PROVIDER_SITE_OTHER): Payer: Medicare Other | Admitting: Urology

## 2016-04-20 DIAGNOSIS — N3941 Urge incontinence: Secondary | ICD-10-CM | POA: Diagnosis not present

## 2016-04-20 DIAGNOSIS — N390 Urinary tract infection, site not specified: Secondary | ICD-10-CM

## 2016-04-20 DIAGNOSIS — N393 Stress incontinence (female) (male): Secondary | ICD-10-CM

## 2016-04-20 DIAGNOSIS — I10 Essential (primary) hypertension: Secondary | ICD-10-CM | POA: Diagnosis not present

## 2016-04-20 DIAGNOSIS — Z8546 Personal history of malignant neoplasm of prostate: Secondary | ICD-10-CM

## 2016-10-26 ENCOUNTER — Encounter (HOSPITAL_COMMUNITY): Payer: Self-pay | Admitting: Emergency Medicine

## 2016-10-26 ENCOUNTER — Emergency Department (HOSPITAL_COMMUNITY)
Admission: EM | Admit: 2016-10-26 | Discharge: 2016-10-26 | Disposition: A | Discharge status: Home / Self Care | Payer: Medicare Other | Attending: Emergency Medicine | Admitting: Emergency Medicine

## 2016-10-26 DIAGNOSIS — E119 Type 2 diabetes mellitus without complications: Secondary | ICD-10-CM | POA: Insufficient documentation

## 2016-10-26 DIAGNOSIS — R339 Retention of urine, unspecified: Secondary | ICD-10-CM | POA: Diagnosis present

## 2016-10-26 DIAGNOSIS — Z466 Encounter for fitting and adjustment of urinary device: Secondary | ICD-10-CM | POA: Diagnosis not present

## 2016-10-26 DIAGNOSIS — Z7982 Long term (current) use of aspirin: Secondary | ICD-10-CM | POA: Insufficient documentation

## 2016-10-26 DIAGNOSIS — Z8546 Personal history of malignant neoplasm of prostate: Secondary | ICD-10-CM | POA: Diagnosis not present

## 2016-10-26 DIAGNOSIS — N358 Other urethral stricture: Secondary | ICD-10-CM | POA: Diagnosis not present

## 2016-10-26 DIAGNOSIS — Z794 Long term (current) use of insulin: Secondary | ICD-10-CM | POA: Insufficient documentation

## 2016-10-26 DIAGNOSIS — N99111 Postprocedural bulbous urethral stricture: Secondary | ICD-10-CM | POA: Insufficient documentation

## 2016-10-26 DIAGNOSIS — R338 Other retention of urine: Secondary | ICD-10-CM | POA: Diagnosis not present

## 2016-10-26 DIAGNOSIS — N3941 Urge incontinence: Secondary | ICD-10-CM | POA: Diagnosis not present

## 2016-10-26 LAB — URINALYSIS, ROUTINE W REFLEX MICROSCOPIC
BILIRUBIN URINE: NEGATIVE
GLUCOSE, UA: 50 mg/dL — AB
KETONES UR: NEGATIVE mg/dL
LEUKOCYTES UA: NEGATIVE
NITRITE: NEGATIVE
PH: 5 (ref 5.0–8.0)
Protein, ur: NEGATIVE mg/dL
Specific Gravity, Urine: 1.015 (ref 1.005–1.030)
Squamous Epithelial / LPF: NONE SEEN

## 2016-10-26 NOTE — ED Triage Notes (Signed)
Patient c/o dysuria and retention. Per patient unable to "popperly void" x3 days-last voided yesterday at 2pm. Patient reports pressure. Patient seen at urgent care yesterday and diagnosed with UTI. Patient given antibiotic in which he got filled today. Denies any prior GU hx.

## 2016-10-26 NOTE — Discharge Instructions (Signed)
Please see Dr. Alyson Ingles at your appointment next week Continue the antibiotics that were prescribed at the office prior to coming here. ER for pain, swelling, vomiting or if the catheter stops working -  Empty the bag 3 or 4 times a day.

## 2016-10-26 NOTE — ED Notes (Signed)
Attempted with 76f coude and no success. Will notify edp

## 2016-10-26 NOTE — ED Provider Notes (Signed)
[facility] DEPT Provider Note   CSN: [id number] Arrival date & time: 10/26/16  1134     History   Chief Complaint Chief Complaint  Patient presents with  . Urinary Retention    HPI Anthony Dunlap is a 83 y.o. male.  HPI  The patient is an 83 year old male,history of prostate cancer in the past, has recently been evaluated at an urgent care as of the last 24 hours because of some urinary retention and dysuria however he reports that he has had worsening symptoms overnight and has not been able to pass much in the way of urine except for the occasional dribble but he is unable to contain or control this. He denies any bowel symptoms, denies any fevers chills and does have some lower abdominal discomfort. He does have a prior history of an appendectomy and does report a history of prostate cancer which is currently under surveillance but not active treatment. He has seen a urologist in the past  He was started on Abx at the UC yesterday  Past Medical History:  Diagnosis Date  . Diabetes ([place])   . Left eye pain   . Prostate cancer [facility])     Patient Active Problem List   Diagnosis Date Noted  . 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 Other and unspecified hyperlipidemia 08/19/2013  . Diabetes ([place])   . Prostate cancer [facility])     Past Surgical History:  Procedure Laterality Date  . HERNIA REPAIR    . PROSTATE SURGERY         Home Medications    Prior to Admission medications   Medication Sig Start Date End Date Taking? Authorizing Provider  aspirin EC 81 MG tablet Take 81 mg by mouth daily.   Yes [provider]  glyBURIDE-metformin (GLUCOVANCE) 5-500 MG tablet Take 2 tablets by mouth 2 (two) times daily. 10/01/16  Yes [provider]  LEVEMIR FLEXTOUCH 100 UNIT/ML Pen Inject 80 Units into the muscle daily. 09/28/16  Yes [provider]  cephALEXin (KEFLEX) 500 MG capsule Take 1 capsule (500 mg total) by mouth 4 (four) times  daily. Patient not taking: Reported on 06/26/8467 08/18/50   Delora Fuel, MD    Family History Family History  Problem Relation Age of Onset  . Pneumonia Father   . Cancer Sister     Social History Social History  Substance Use Topics  . Smoking status: Never Smoker  . Smokeless tobacco: Never Used  . Alcohol use No     Allergies   Patient has no known allergies.   Review of Systems Review of Systems  All other systems reviewed and are negative.    Physical Exam Updated Vital Signs BP (!) 160/99   Pulse 100   Temp 98.1 F (36.7 C) (Oral)   Resp 18   Ht 5\' 8"  (1.727 m)   Wt 73 kg (161 lb)   SpO2 96%   BMI 24.48 kg/m   Physical Exam  Constitutional: He appears well-developed and well-nourished. No distress.  HENT:  Head: Normocephalic and atraumatic.  Mouth/Throat: Oropharynx is clear and moist. No oropharyngeal exudate.  Eyes: Pupils are equal, round, and reactive to light. Conjunctivae and EOM are normal. Right eye exhibits no discharge. Left eye exhibits no discharge. No scleral icterus.  Neck: Normal range of motion. Neck supple. No JVD present. No thyromegaly present.  Cardiovascular: Regular rhythm, normal heart sounds and intact distal pulses.  Exam reveals no gallop and no friction rub.  No murmur heard. Mild tachycardia to 105 bpm, normal pulses, no edema, no JVD  Pulmonary/Chest: Effort normal and breath sounds normal. No respiratory distress. He has no wheezes. He has no rales.  Abdominal: Soft. Bowel sounds are normal. He exhibits no distension and no mass. There is tenderness ( focal tenderness to palpation in the suprapubic region with a fullness, seems to be up to the level of the umbilicus).  Genitourinary:  Genitourinary Comments: Normal appearing penis scrotum and testicles, he does have a small amount of drip incontinence from the penis, no bleeding, no discharge, no redness no swelling  Musculoskeletal: Normal range of motion. He exhibits no  edema or tenderness.  Lymphadenopathy:    He has no cervical adenopathy.  Neurological: He is alert. Coordination normal.  Skin: Skin is warm and dry. No rash noted. No erythema.  Psychiatric: He has a normal mood and affect. His behavior is normal.  Nursing note and vitals reviewed.    ED Treatments / Results  Labs (all labs ordered are listed, but only abnormal results are displayed) Labs Reviewed  URINALYSIS, ROUTINE W REFLEX MICROSCOPIC - Abnormal; Notable for the following:       Result Value   Glucose, UA 50 (*)    Hgb urine dipstick SMALL (*)    Bacteria, UA RARE (*)    All other components within normal limits  URINE CULTURE     Radiology No results found.  Procedures Procedures (including critical care time)  Medications Ordered in ED Medications - No data to display   Initial Impression / Assessment and Plan / ED Course  I have reviewed the triage vital signs and the nursing notes.  Pertinent labs & imaging results that were available during my care of the patient were reviewed by me and considered in my medical decision making (see chart for details).     Exam consistent with a likely overflow incontinence and in fact on a bedside ultrasound does reveal that he has a very enlarged bladder. This urinary retention is likely secondary to prostate problems and enlargement, we'll place Foley catheter, urinalysis, urine culture, anticipate discharge once urine sample received an process. He was placed on an antibiotic yesterday at the urgent care but has not yet started it today.  Review of the medical record shows that the patient has had a urethral stricture, I was unable to pass a urinary catheter 2 attempts, urologist was paged and thankfully able to dilate the stricture and pass a 16 Foley catheter.  Patient given return instructions, will continue on antibiotics, urinalysis here shows no infection however post-catheter placement the patient will need to be on  antibiotics according to the urologist, he is okay with what they have been prescribed yesterday at the urgent care. Patient stable for discharge  Final Clinical Impressions(s) / ED Diagnoses   Final diagnoses:  Urinary retention  Postprocedural bulbous urethral stricture    New Prescriptions New Prescriptions   No medications on file     Noemi Chapel, MD 10/26/16 1546

## 2016-10-27 NOTE — Consult Note (Signed)
Urology Consult  Referring physician: Dr. Sabra Heck Reason for referral: urinary retention, unable to place foley catheter  Chief Complaint: supapubic pain  History of Present Illness: Mr Marina Goodell is a 83yo with a hx of prostate cancer who presented to the ER with a 2 day history of urinary incontinence and an inability to urinate. He has a hx of EBRT over 15 years for prostate cancer. He has had a weak urinary stream for over 3 years. He noted worsening urgency and frequency over over the past week. Yesterday morning he awoke and could only dribble. Since then he has had constant urinary incontinence associated with severe constant sharp, nonraditing supapubic pain. No exacerbating/alleviating events. No other associated symptoms. No hematuria or dysuria. NO hx of urethral stricture disease. Multiple attempted were made in the ER to place a foley which were unsuccessful.  Past Medical History:  Diagnosis Date  . Diabetes ([place])   . Left eye pain   . Prostate cancer [facility])    Past Surgical History:  Procedure Laterality Date  . HERNIA REPAIR    . PROSTATE SURGERY      Medications: I have reviewed the patient's current medications. Allergies: No Known Allergies  Family History  Problem Relation Age of Onset  . Pneumonia Father   . Cancer Sister    Social History:  reports that he has never smoked. He has never used smokeless tobacco. He reports that he does not drink alcohol or use drugs.  Review of Systems  Gastrointestinal: Positive for abdominal pain.  Genitourinary: Positive for frequency and urgency.  All other systems reviewed and are negative.   Physical Exam:  Vital signs in last 24 hours: Pulse Rate:  [84] 84 (09/07 1639) Resp:  [18] 18 (09/07 1639) BP: (124)/(79) 124/79 (09/07 1639) SpO2:  [98 %] 98 % (09/07 1639) Physical Exam  Constitutional: He is oriented to person, place, and time. He appears well-developed and well-nourished.  HENT:  Head: Normocephalic and  atraumatic.  Eyes: Pupils are equal, round, and reactive to light. EOM are normal.  Neck: Normal range of motion. No thyromegaly present.  Cardiovascular: Normal rate and regular rhythm.   Respiratory: Effort normal. No respiratory distress.  GI: Soft. He exhibits mass. He exhibits no distension. There is tenderness. There is no rebound and no guarding. Hernia confirmed negative in the right inguinal area and confirmed negative in the left inguinal area.  Genitourinary: Testes normal and penis normal. Cremasteric reflex is present. Right testis shows no mass, no swelling and no tenderness. Right testis is descended. Cremasteric reflex is not absent on the right side. Left testis shows no mass, no swelling and no tenderness. Left testis is descended. Cremasteric reflex is not absent on the left side. Circumcised. No hypospadias or penile tenderness.  Musculoskeletal: Normal range of motion. He exhibits no edema.  Lymphadenopathy:       Right: No inguinal adenopathy present.       Left: No inguinal adenopathy present.  Neurological: He is alert and oriented to person, place, and time.  Skin: Skin is warm and dry.  Psychiatric: He has a normal mood and affect. His behavior is normal. Judgment and thought content normal.    Laboratory Data:  Results for orders placed or performed during the hospital encounter of 10/26/16 (from the past 72 hour(s))  Urinalysis, Routine w reflex microscopic     Status: Abnormal   Collection Time: 10/26/16 11:50 AM  Result Value Ref Range   Color, Urine YELLOW YELLOW  APPearance CLEAR CLEAR   Specific Gravity, Urine 1.015 1.005 - 1.030   pH 5.0 5.0 - 8.0   Glucose, UA 50 (A) NEGATIVE mg/dL   Hgb urine dipstick SMALL (A) NEGATIVE   Bilirubin Urine NEGATIVE NEGATIVE   Ketones, ur NEGATIVE NEGATIVE mg/dL   Protein, ur NEGATIVE NEGATIVE mg/dL   Nitrite NEGATIVE NEGATIVE   Leukocytes, UA NEGATIVE NEGATIVE   RBC / HPF 0-5 0 - 5 RBC/hpf   WBC, UA 0-5 0 - 5  WBC/hpf   Bacteria, UA RARE (A) NONE SEEN   Squamous Epithelial / LPF NONE SEEN NONE SEEN  Urine Culture     Status: None (Preliminary result)   Collection Time: 10/26/16 12:00 PM  Result Value Ref Range   Specimen Description URINE, CATHETERIZED    Special Requests NONE    Culture      CULTURE REINCUBATED FOR BETTER GROWTH Performed at [facility], [address] [place], [place] [address]    Report Status PENDING    Recent Results (from the past 240 hour(s))  Urine Culture     Status: None (Preliminary result)   Collection Time: 10/26/16 12:00 PM  Result Value Ref Range Status   Specimen Description URINE, CATHETERIZED  Final   Special Requests NONE  Final   Culture   Final    CULTURE REINCUBATED FOR BETTER GROWTH Performed at [facility], [address], [place], [place] [address]    Report Status PENDING  Incomplete   Creatinine: No results for input(s): CREATININE in the last 168 hours. Baseline Creatinine: unknown  Foley Catheter Placemenet--Complex  Patient was prepped and draped in the usual sterile fashion using betadine solution. 2% viscous lidocaine was inserted per urethra. A 0.038 sensor was advanced per urethra without significant resistance or recoiling. Upon passage through the bladder neck with the wire urine was noted to emanate from the urethral meatus. The filliform was then advanced into the bladder. We then placed the 12 french follower over the filliform and then advanced it into the bladder. We then withdrew the follower and increased the size to 14 french, then 16 french, then 18 french. A 16 Fr council catheter was placed via the Blitz technique and passed easily with immediate return of >1000 cc of clear, amber colored urine without clot. 10 cc sterile water was placed in the balloon port.    Impression/Assessment:  83yo with urinary retention and bulbar urethral stricture.   Plan:  1. 89 French foley placed after urethral dilation to  18 french with filliform and followers. The foley should remain in place for 1 week for a voiding trial with [facility]  Nicolette Bang 10/27/2016, 4:00 PM

## 2016-10-28 LAB — URINE CULTURE: Culture: 10000 — AB

## 2016-10-29 ENCOUNTER — Telehealth: Payer: Self-pay | Admitting: *Deleted

## 2016-10-29 NOTE — Telephone Encounter (Signed)
Post ED Visit - Positive Culture Follow-up  Culture report reviewed by antimicrobial stewardship pharmacist:  []  Elenor Quinones, Pharm.D. []  Heide Guile, Pharm.D., BCPS AQ-ID []  Parks Neptune, Pharm.D., BCPS []  Alycia Rossetti, Pharm.D., BCPS []  [place], Pharm.D., BCPS, AAHIVP []  Legrand Como, Pharm.D., BCPS, AAHIVP []  Salome Arnt, PharmD, BCPS []  Dimitri Ped, PharmD, BCPS []  Vincenza Hews, PharmD, BCPS [facility], PA-C  Positive urine culture Foley place with >1058ml obtained, no further patient follow-up is required at this time.  Harlon Flor Talley 10/29/2016, 3:12 PM

## 2016-11-02 ENCOUNTER — Ambulatory Visit (INDEPENDENT_AMBULATORY_CARE_PROVIDER_SITE_OTHER): Payer: Medicare Other | Admitting: Urology

## 2016-11-02 DIAGNOSIS — Z8546 Personal history of malignant neoplasm of prostate: Secondary | ICD-10-CM | POA: Diagnosis not present

## 2016-11-24 ENCOUNTER — Emergency Department (HOSPITAL_COMMUNITY)
Admission: EM | Admit: 2016-11-24 | Discharge: 2016-11-24 | Disposition: A | Discharge status: Home / Self Care | Payer: Medicare Other | Attending: Emergency Medicine | Admitting: Emergency Medicine

## 2016-11-24 ENCOUNTER — Encounter (HOSPITAL_COMMUNITY): Payer: Self-pay | Admitting: Emergency Medicine

## 2016-11-24 DIAGNOSIS — N99110 Postprocedural urethral stricture, male, meatal: Secondary | ICD-10-CM | POA: Diagnosis not present

## 2016-11-24 DIAGNOSIS — Z7984 Long term (current) use of oral hypoglycemic drugs: Secondary | ICD-10-CM | POA: Diagnosis not present

## 2016-11-24 DIAGNOSIS — R339 Retention of urine, unspecified: Secondary | ICD-10-CM | POA: Diagnosis not present

## 2016-11-24 DIAGNOSIS — R109 Unspecified abdominal pain: Secondary | ICD-10-CM | POA: Diagnosis present

## 2016-11-24 DIAGNOSIS — E119 Type 2 diabetes mellitus without complications: Secondary | ICD-10-CM | POA: Insufficient documentation

## 2016-11-24 DIAGNOSIS — Z8546 Personal history of malignant neoplasm of prostate: Secondary | ICD-10-CM | POA: Insufficient documentation

## 2016-11-24 DIAGNOSIS — R338 Other retention of urine: Secondary | ICD-10-CM

## 2016-11-24 DIAGNOSIS — Z79899 Other long term (current) drug therapy: Secondary | ICD-10-CM | POA: Diagnosis not present

## 2016-11-24 DIAGNOSIS — Z7982 Long term (current) use of aspirin: Secondary | ICD-10-CM | POA: Insufficient documentation

## 2016-11-24 LAB — BASIC METABOLIC PANEL
ANION GAP: 9 (ref 5–15)
BUN: 20 mg/dL (ref 6–20)
CO2: 25 mmol/L (ref 22–32)
Calcium: 9.2 mg/dL (ref 8.9–10.3)
Chloride: 107 mmol/L (ref 101–111)
Creatinine, Ser: 0.76 mg/dL (ref 0.61–1.24)
GFR calc Af Amer: >60 mL/min (ref >60)
GFR calc non Af Amer: >60 mL/min (ref >60)
GLUCOSE: 134 mg/dL — AB (ref 65–99)
Potassium: 4.1 mmol/L (ref 3.5–5.1)
Sodium: 141 mmol/L (ref 135–145)

## 2016-11-24 LAB — CBC WITH DIFFERENTIAL/PLATELET
BASOS ABS: 0 10*3/uL (ref 0.0–0.1)
Basophils Relative: 0 %
Eosinophils Absolute: 0.2 10*3/uL (ref 0.0–0.7)
Eosinophils Relative: 2 %
HCT: 36.6 % — ABNORMAL LOW (ref 39.0–52.0)
HEMOGLOBIN: 12.1 g/dL — AB (ref 13.0–17.0)
Lymphocytes Relative: 15 %
Lymphs Abs: 1.1 10*3/uL (ref 0.7–4.0)
MCH: 28.3 pg (ref 26.0–34.0)
MCHC: 33.1 g/dL (ref 30.0–36.0)
MCV: 85.7 fL (ref 78.0–100.0)
MONO ABS: 0.6 10*3/uL (ref 0.1–1.0)
Monocytes Relative: 9 %
Neutro Abs: 5.2 10*3/uL (ref 1.7–7.7)
Neutrophils Relative %: 74 %
Platelets: 237 10*3/uL (ref 150–400)
RBC: 4.27 MIL/uL (ref 4.22–5.81)
RDW: 13.1 % (ref 11.5–15.5)
WBC: 7.1 10*3/uL (ref 4.0–10.5)

## 2016-11-24 MED ORDER — CIPROFLOXACIN HCL 500 MG PO TABS: 500.0000 mg | ORAL_TABLET | Freq: Two times a day (BID) | ORAL | 0 refills | Status: DC

## 2016-11-24 MED ORDER — CIPROFLOXACIN HCL 250 MG PO TABS
500.0000 mg | ORAL_TABLET | Freq: Once | ORAL | Status: AC
  Administered 2016-11-24: 500 mg via ORAL
  Filled 2016-11-24: qty 2

## 2016-11-24 NOTE — Discharge Instructions (Signed)
Take the antibiotic twice a day until gone. Call [facility] to get an appointment in 1-2 weeks. Recheck if the catheter stops draining or you get a fever or vomiting.

## 2016-11-24 NOTE — ED Provider Notes (Signed)
[facility] DEPT Provider Note   CSN: [id number] Arrival date & time: 11/24/16  0331  Time seen 03:51 AM   History   Chief Complaint Chief Complaint  Patient presents with  . Urinary Retention    HPI Anthony Dunlap is a 83 y.o. male.  HPI  patient has a history of acute urinary retention, he was seen on September 7 and nursing staff was unable to get a Foley catheter passed. Urology had to come and use a wire and do dilatation. Patient states his catheter was removed about 2 or 3 weeks ago. He states he's been dribbling all week, and then tonight he started having abdominal pain. He denies any nausea or vomiting.  PCP [name], MD Urology Dr Alyson Ingles  Past Medical History:  Diagnosis Date  . Diabetes ([place])   . Left eye pain   . Prostate cancer [facility])     Patient Active Problem List   Diagnosis Date Noted  . 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 Other and unspecified hyperlipidemia 08/19/2013  . Diabetes ([place])   . Prostate cancer [facility])     Past Surgical History:  Procedure Laterality Date  . HERNIA REPAIR    . PROSTATE SURGERY         Home Medications    Prior to Admission medications   Medication Sig Start Date End Date Taking? Authorizing Provider  aspirin EC 81 MG tablet Take 81 mg by mouth daily.   Yes [provider]  glyBURIDE-metformin (GLUCOVANCE) 5-500 MG tablet Take 2 tablets by mouth 2 (two) times daily. 10/01/16  Yes [provider]  LEVEMIR FLEXTOUCH 100 UNIT/ML Pen Inject 80 Units into the muscle daily. 09/28/16  Yes [provider]  cephALEXin (KEFLEX) 500 MG capsule Take 1 capsule (500 mg total) by mouth 4 (four) times daily. Patient not taking: Reported on 7/0/0174 9/44/96   Delora Fuel, MD  ciprofloxacin (CIPRO) 500 MG tablet Take 1 tablet (500 mg total) by mouth 2 (two) times daily. 11/24/16   Rolland Porter, MD    Family History Family History  Problem Relation Age of Onset  . Pneumonia Father   .  Cancer Sister     Social History Social History  Substance Use Topics  . Smoking status: Never Smoker  . Smokeless tobacco: Never Used  . Alcohol use No     Allergies   Patient has no known allergies.   Review of Systems Review of Systems  All other systems reviewed and are negative.    Physical Exam Updated Vital Signs BP (!) 187/114 (BP Location: Left Arm)   Pulse (!) 107   Temp 97.8 F (36.6 C) (Oral)   Resp 18   Ht 5\' 8"  (1.727 m)   Wt 73 kg (161 lb)   SpO2 98%   BMI 24.48 kg/m   Vital signs normal except for hypertension and tachycardia   Physical Exam  Constitutional: He is oriented to person, place, and time.  Elderly male who appears uncomfortable  HENT:  Head: Normocephalic and atraumatic.  Right Ear: External ear normal.  Left Ear: External ear normal.  Nose: Nose normal.  Eyes: Conjunctivae and EOM are normal.  Neck: Normal range of motion.  Pulmonary/Chest: Effort normal. He has no wheezes.  Abdominal: He exhibits distension. There is tenderness.  Genitourinary: Penis normal.  Musculoskeletal: Normal range of motion. He exhibits no deformity.  Neurological: He is alert and oriented to person, place, and time. No cranial nerve deficit.  Skin: Skin is warm and dry.  Psychiatric: He has a normal mood and affect. His behavior is normal. Thought content normal.  Nursing note and vitals reviewed.    ED Treatments / Results  Labs (all labs ordered are listed, but only abnormal results are displayed) Results for orders placed or performed during the hospital encounter of 62/69/48  Basic metabolic panel  Result Value Ref Range   Sodium 141 135 - 145 mmol/L   Potassium 4.1 3.5 - 5.1 mmol/L   Chloride 107 101 - 111 mmol/L   CO2 25 22 - 32 mmol/L   Glucose, Bld 134 (H) 65 - 99 mg/dL   BUN 20 6 - 20 mg/dL   Creatinine, Ser 0.76 0.61 - 1.24 mg/dL   Calcium 9.2 8.9 - 10.3 mg/dL   GFR calc non Af Amer >60 >60 mL/min   GFR calc Af Amer >60 >60  mL/min   Anion gap 9 5 - 15  CBC with Differential  Result Value Ref Range   WBC 7.1 4.0 - 10.5 K/uL   RBC 4.27 4.22 - 5.81 MIL/uL   Hemoglobin 12.1 (L) 13.0 - 17.0 g/dL   HCT 36.6 (L) 39.0 - 52.0 %   MCV 85.7 78.0 - 100.0 fL   MCH 28.3 26.0 - 34.0 pg   MCHC 33.1 30.0 - 36.0 g/dL   RDW 13.1 11.5 - 15.5 %   Platelets 237 150 - 400 K/uL   Neutrophils Relative % 74 %   Neutro Abs 5.2 1.7 - 7.7 K/uL   Lymphocytes Relative 15 %   Lymphs Abs 1.1 0.7 - 4.0 K/uL   Monocytes Relative 9 %   Monocytes Absolute 0.6 0.1 - 1.0 K/uL   Eosinophils Relative 2 %   Eosinophils Absolute 0.2 0.0 - 0.7 K/uL   Basophils Relative 0 %   Basophils Absolute 0.0 0.0 - 0.1 K/uL   Laboratory interpretation all normal except mild anemia    EKG  EKG Interpretation None       Radiology No results found.  Procedures Procedures (including critical care time)  Medications Ordered in ED Medications  ciprofloxacin (CIPRO) tablet 500 mg (not administered)     Initial Impression / Assessment and Plan / ED Course  I have reviewed the triage vital signs and the nursing notes.  Pertinent labs & imaging results that were available during my care of the patient were reviewed by me and considered in my medical decision making (see chart for details).    Bladder scan reveals over 900  mL urine.  Nursing staff tried to pass a #16 coud catheter without success. The house supervisor was called to get a #14.  04:45 AM unable to pass #14 foley. Will consult Urology. Pt did start having some dribbling after manipulation and had out about 350 cc of urine.   05:02 AM [name], Urology will come see patient.   06:20 AM [name] has placed a foley. Asks to start patient on cipro x 3 days and f/u in 1-2 weeks in the office.   Review of visit on October 26, 2016 Foley Catheter Placemenet--Complex  Patient was prepped and draped in the usual sterile fashion using betadine solution. 2% viscous lidocaine was  inserted per urethra. A 0.038 sensor was advanced per urethra without significant resistance or recoiling. Upon passage through the bladder neck with the wire urine was noted to emanate from the urethral meatus. The filliform was then advanced into the bladder. We then placed the 12  french follower over the filliform and then advanced it into the bladder. We then withdrew the follower and increased the size to 14 french, then 16 french, then 18 french. A 16 Fr council catheter was placed via the Blitz technique and passed easily with immediate return of >1000 cc of clear, amber colored urine without clot. 10 cc sterile water was placed in the balloon port.    Impression/Assessment:  83yo with urinary retention and bulbar urethral stricture.   Plan:  1. 55 French foley placed after urethral dilation to 18 french with filliform and followers. The foley should remain in place for 1 week for a voiding trial with [facility] Nicolette Bang  Final Clinical Impressions(s) / ED Diagnoses   Final diagnoses:  Urinary retention    New Prescriptions New Prescriptions   CIPROFLOXACIN (CIPRO) 500 MG TABLET    Take 1 tablet (500 mg total) by mouth 2 (two) times daily.    Plan discharge  Rolland Porter, MD, Barbette Or, MD 11/24/16 [phone]

## 2016-11-24 NOTE — ED Triage Notes (Signed)
Pt c/o urinary retention that started this am.

## 2016-11-24 NOTE — Consult Note (Addendum)
H&P Physician requesting consult: Rolland Porter, MD  Chief Complaint: urinary retention  History of Present Illness: 83yo with a hx of prostate cancer who presented to the ER with urinary dribbling and an inability to urinate. He has a hx of EBRT over 15 years for prostate cancer. He has had a weak urinary stream for over 3 years. he has had severe supapubic pain. No exacerbating/alleviating events. No other associated symptoms. No hematuria or dysuria.Multiple attempted were made in the ER to place a foley which were unsuccessful. This also occurred back on 10/27/2016 and Dr. Alyson Ingles diluted his urethra to 84 [place] and placed a 16 French catheter. The patient recently passed a voiding trial about a week later.  Past Medical History:  Diagnosis Date  . Diabetes ([place])   . Left eye pain   . Prostate cancer [facility])    Past Surgical History:  Procedure Laterality Date  . HERNIA REPAIR    . PROSTATE SURGERY      Home Medications:   (Not in a hospital admission) Allergies: No Known Allergies  Family History  Problem Relation Age of Onset  . Pneumonia Father   . Cancer Sister    Social History:  reports that he has never smoked. He has never used smokeless tobacco. He reports that he does not drink alcohol or use drugs.  ROS: A complete review of systems was performed.  All systems are negative except for pertinent findings as noted. ROS   Physical Exam:  Vital signs in last 24 hours: Temp:  [97.8 F (36.6 C)] 97.8 F (36.6 C) (10/06 0342) Pulse Rate:  [107] 107 (10/06 0342) Resp:  [18] 18 (10/06 0342) BP: (187)/(114) 187/114 (10/06 0342) SpO2:  [98 %] 98 % (10/06 0342) Weight:  [73 kg (161 lb)] 73 kg (161 lb) (10/06 0338) General:  Alert and oriented, No acute distress HEENT: Normocephalic, atraumatic Neck: No JVD or lymphadenopathy Cardiovascular: Regular rate and rhythm Lungs: Regular rate and effort Abdomen: Soft, nontender, nondistended, no abdominal masses, old surgical  scars GU: scrotum testis and penis WNL Back: No CVA tenderness Extremities: No edema Neurologic: Grossly intact  Laboratory Data:  Results for orders placed or performed during the hospital encounter of 11/24/16 (from the past 24 hour(s))  Basic metabolic panel     Status: Abnormal   Collection Time: 11/24/16  5:31 AM  Result Value Ref Range   Sodium 141 135 - 145 mmol/L   Potassium 4.1 3.5 - 5.1 mmol/L   Chloride 107 101 - 111 mmol/L   CO2 25 22 - 32 mmol/L   Glucose, Bld 134 (H) 65 - 99 mg/dL   BUN 20 6 - 20 mg/dL   Creatinine, Ser 0.76 0.61 - 1.24 mg/dL   Calcium 9.2 8.9 - 10.3 mg/dL   GFR calc non Af Amer >60 >60 mL/min   GFR calc Af Amer >60 >60 mL/min   Anion gap 9 5 - 15  CBC with Differential     Status: Abnormal   Collection Time: 11/24/16  5:31 AM  Result Value Ref Range   WBC 7.1 4.0 - 10.5 K/uL   RBC 4.27 4.22 - 5.81 MIL/uL   Hemoglobin 12.1 (L) 13.0 - 17.0 g/dL   HCT 36.6 (L) 39.0 - 52.0 %   MCV 85.7 78.0 - 100.0 fL   MCH 28.3 26.0 - 34.0 pg   MCHC 33.1 30.0 - 36.0 g/dL   RDW 13.1 11.5 - 15.5 %   Platelets 237 150 - 400 K/uL  Neutrophils Relative % 74 %   Neutro Abs 5.2 1.7 - 7.7 K/uL   Lymphocytes Relative 15 %   Lymphs Abs 1.1 0.7 - 4.0 K/uL   Monocytes Relative 9 %   Monocytes Absolute 0.6 0.1 - 1.0 K/uL   Eosinophils Relative 2 %   Eosinophils Absolute 0.2 0.0 - 0.7 K/uL   Basophils Relative 0 %   Basophils Absolute 0.0 0.0 - 0.1 K/uL   No results found for this or any previous visit (from the past 240 hour(s)). Creatinine:  Recent Labs  11/24/16 0531  CREATININE 0.76    Urethral dilation with Foley Catheter Placemenet--Complex  Patient was prepped and draped in the usual sterile fashion using betadine solution.  A 0.038 sensor was advanced per urethra without significant resistance or recoiling. Upon passage through the bladder neck with the wire small amount of urine was noted to exit from the urethral meatus. I then sequentially dilated  over the wire from 10 [place], 12 French,14 french,16 french, then 18 french. A 14 Fr silicone catheter was placed easily with immediate return of >1000 cc of clear urine without clot. 10 cc sterile water was placed in the balloon port. The catheter was secured to the patient's left leg.  Impression/Assessment:  Urinary retention with recurrent bulbar urethral stricture  Plan:  He will keep the catheter for 1-2 weeks and follow up in clinic. We'll decide at that point for voiding trial versus DVIU given the quick recurrence. Recommend 3 days of prophylactic antibiotic given all the instrumentation.  Marton Redwood, III 11/24/2016, 6:24 AM

## 2016-12-13 ENCOUNTER — Emergency Department (HOSPITAL_COMMUNITY)
Admission: EM | Admit: 2016-12-13 | Discharge: 2016-12-13 | Disposition: A | Discharge status: Home / Self Care | Payer: Medicare Other | Attending: Emergency Medicine | Admitting: Emergency Medicine

## 2016-12-13 ENCOUNTER — Encounter (HOSPITAL_COMMUNITY): Payer: Self-pay | Admitting: Emergency Medicine

## 2016-12-13 DIAGNOSIS — E119 Type 2 diabetes mellitus without complications: Secondary | ICD-10-CM | POA: Diagnosis not present

## 2016-12-13 DIAGNOSIS — Z794 Long term (current) use of insulin: Secondary | ICD-10-CM | POA: Insufficient documentation

## 2016-12-13 DIAGNOSIS — Z8546 Personal history of malignant neoplasm of prostate: Secondary | ICD-10-CM | POA: Insufficient documentation

## 2016-12-13 DIAGNOSIS — Z7982 Long term (current) use of aspirin: Secondary | ICD-10-CM | POA: Insufficient documentation

## 2016-12-13 DIAGNOSIS — R339 Retention of urine, unspecified: Secondary | ICD-10-CM | POA: Insufficient documentation

## 2016-12-13 LAB — URINALYSIS, ROUTINE W REFLEX MICROSCOPIC
BACTERIA UA: NONE SEEN
Bilirubin Urine: NEGATIVE
Glucose, UA: >=500 mg/dL — AB
Hgb urine dipstick: NEGATIVE
Ketones, ur: NEGATIVE mg/dL
Nitrite: NEGATIVE
Protein, ur: NEGATIVE mg/dL
Specific Gravity, Urine: 1.008 (ref 1.005–1.030)
pH: 5 (ref 5.0–8.0)

## 2016-12-13 NOTE — ED Triage Notes (Signed)
Patient complains of urinary retention. Had foley cath in place but was taken out Tuesday 12/11/16.  He says he has not voided since it was taken out.

## 2016-12-13 NOTE — Discharge Instructions (Signed)
Keep catheter in place.  Empty leg bag 1 full.  Call for additional follow-up with urology let him know we had to reinsert the catheter.  Urine culture was done and is pending.

## 2016-12-13 NOTE — ED Provider Notes (Signed)
[facility] EMERGENCY DEPARTMENT Provider Note   CSN: [id number] Arrival date & time: 12/13/16  7829     History   Chief Complaint Chief Complaint  Patient presents with  . Urinary Retention    HPI Anthony Dunlap is a 83 y.o. male.  Patient having difficulty with urinary retention since September.  Patient had a second Foley catheter placed and removed on October 23.  Followed by Dr[facility] Glorian[facility] Loan from [facility].  They removed the catheter.  Patient has not urinated since the catheter came out.  Patient states that his abdominal discomfort and distention but no other complaints.      Past Medical History:  Diagnosis Date  . Diabetes ([place])   . Left eye pain   . Prostate cancer [facility])     Patient Active Problem List   Diagnosis Date Noted  . 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 Other and unspecified hyperlipidemia 08/19/2013  . Diabetes ([place])   . Prostate cancer [facility])     Past Surgical History:  Procedure Laterality Date  . HERNIA REPAIR    . PROSTATE SURGERY         Home Medications    Prior to Admission medications   Medication Sig Start Date End Date Taking? Authorizing Provider  aspirin EC 81 MG tablet Take 81 mg by mouth daily.    [provider]  cephALEXin (KEFLEX) 500 MG capsule Take 1 capsule (500 mg total) by mouth 4 (four) times daily. Patient not taking: Reported on 06/24/2128 8/65/78   Delora Fuel, MD  ciprofloxacin (CIPRO) 500 MG tablet Take 1 tablet (500 mg total) by mouth 2 (two) times daily. 11/24/16   Rolland Porter, MD  glyBURIDE-metformin (GLUCOVANCE) 5-500 MG tablet Take 2 tablets by mouth 2 (two) times daily. 10/01/16   [provider]  LEVEMIR FLEXTOUCH 100 UNIT/ML Pen Inject 80 Units into the muscle daily. 09/28/16   [provider]    Family History Family History  Problem Relation Age of Onset  . Pneumonia Father   . Cancer Sister     Social History Social History  Substance Use Topics  . Smoking  status: Never Smoker  . Smokeless tobacco: Never Used  . Alcohol use No     Allergies   Patient has no known allergies.   Review of Systems Review of Systems  Constitutional: Negative for fever.  HENT: Negative for congestion.   Eyes: Negative for visual disturbance.  Respiratory: Negative for shortness of breath.   Cardiovascular: Negative for chest pain.  Gastrointestinal: Positive for abdominal pain.  Genitourinary: Positive for difficulty urinating. Negative for dysuria.  Musculoskeletal: Negative for back pain.  Skin: Negative for rash.  Neurological: Negative for headaches.  Hematological: Does not bruise/bleed easily.  Psychiatric/Behavioral: Negative for confusion.     Physical Exam Updated Vital Signs BP (!) 148/91   Pulse 81   Temp 97.6 F (36.4 C) (Oral)   Resp 18   Ht 1.727 m (5\' 8" )   Wt 73 kg (161 lb)   SpO2 100%   BMI 24.48 kg/m   Physical Exam  Constitutional: He is oriented to person, place, and time. He appears well-developed and well-nourished. No distress.  HENT:  Head: Normocephalic and atraumatic.  Mouth/Throat: Oropharynx is clear and moist.  Eyes: Pupils are equal, round, and reactive to light. Conjunctivae and EOM are normal.  Neck: Normal range of motion. Neck supple.  Pulmonary/Chest: Effort normal and breath sounds normal. No respiratory distress.  Abdominal:  Soft. Bowel sounds are normal. There is no tenderness.  Bladder palpable in the suprapubic area.  Appears to be significantly enlarged.  Musculoskeletal: Normal range of motion.  Neurological: He is alert and oriented to person, place, and time. No cranial nerve deficit. He exhibits normal muscle tone. Coordination normal.  Skin: Skin is warm.  Nursing note and vitals reviewed.    ED Treatments / Results  Labs (all labs ordered are listed, but only abnormal results are displayed) Labs Reviewed  URINALYSIS, ROUTINE W REFLEX MICROSCOPIC - Abnormal; Notable for the following:        Result Value   Glucose, UA >=500 (*)    Leukocytes, UA TRACE (*)    Squamous Epithelial / LPF 0-5 (*)    All other components within normal limits  URINE CULTURE    EKG  EKG Interpretation None       Radiology No results found.  Procedures Procedures (including critical care time)  Medications Ordered in ED Medications - No data to display   Initial Impression / Assessment and Plan / ED Course  I have reviewed the triage vital signs and the nursing notes.  Pertinent labs & imaging results that were available during my care of the patient were reviewed by me and considered in my medical decision making (see chart for details).     Patient had bladder scan done which showed about 900 cc of urine.  Catheter was placed by nursing.  Clear urine.  Urine sent for culture.  Patient was significant improvement.  Switched over to leg bag.  Catheter will need to probably stay in place until urology can actually do a scope to correct the problem.  We will have him follow back up with Dr. Gloriann Loan of [facility].  Patient was significant relief after placement of the catheter.  Final Clinical Impressions(s) / ED Diagnoses   Final diagnoses:  Urinary retention    New Prescriptions New Prescriptions   No medications on file     Fredia Sorrow, MD 12/13/16 1616

## 2016-12-16 LAB — URINE CULTURE
Culture: >=100000 — AB
SPECIAL REQUESTS: NORMAL

## 2016-12-17 ENCOUNTER — Telehealth: Payer: Self-pay | Admitting: Emergency Medicine

## 2016-12-17 NOTE — Progress Notes (Signed)
ED Antimicrobial Stewardship Positive Culture Follow Up   Anthony Dunlap is an 83 y.o. male who presented to [facility] on 12/13/2016 with a chief complaint of  Chief Complaint  Patient presents with  . Urinary Retention    Recent Results (from the past 720 hour(s))  Urine culture     Status: Abnormal   Collection Time: 12/13/16 10:29 AM  Result Value Ref Range Status   Specimen Description URINE, CATHETERIZED  Final   Special Requests Normal  Final   Culture >=100,000 COLONIES/mL ENTEROCOCCUS FAECALIS (A)  Final   Report Status 12/16/2016 FINAL  Final   Organism ID, Bacteria ENTEROCOCCUS FAECALIS (A)  Final      Susceptibility   Enterococcus faecalis - MIC*    AMPICILLIN <=2 SENSITIVE Sensitive     LEVOFLOXACIN >=8 RESISTANT Resistant     NITROFURANTOIN <=16 SENSITIVE Sensitive     VANCOMYCIN 1 SENSITIVE Sensitive     * >=100,000 COLONIES/mL ENTEROCOCCUS FAECALIS   Call results to [facility] for treatment decision  ED Provider: Gifford Shave 12/17/2016, 9:03 AM Infectious Diseases Pharmacist Phone# [phone]

## 2016-12-17 NOTE — Telephone Encounter (Signed)
Post ED Visit - Positive Culture Follow-up  Culture report reviewed by antimicrobial stewardship pharmacist:  []  Elenor Quinones, Pharm.D. []  Heide Guile, Pharm.D., BCPS AQ-ID [x]  Parks Neptune, Pharm.D., BCPS []  Alycia Rossetti, Pharm.D., BCPS []  [place], Pharm.D., BCPS, AAHIVP []  Legrand Como, Pharm.D., BCPS, AAHIVP []  Salome Arnt, PharmD, BCPS []  Dimitri Ped, PharmD, BCPS []  Vincenza Hews, PharmD, BCPS  Positive urine culture Treated with none,results faxed to Dr. Gloriann Loan @ [facility] @ [phone], no further patient follow-up is required at this time.  Hazle Nordmann 12/17/2016, 12:57 PM

## 2017-05-17 ENCOUNTER — Ambulatory Visit (INDEPENDENT_AMBULATORY_CARE_PROVIDER_SITE_OTHER): Payer: Medicare Other | Admitting: Urology

## 2017-05-17 DIAGNOSIS — Z8546 Personal history of malignant neoplasm of prostate: Secondary | ICD-10-CM

## 2017-05-17 DIAGNOSIS — R338 Other retention of urine: Secondary | ICD-10-CM

## 2017-06-13 ENCOUNTER — Ambulatory Visit (INDEPENDENT_AMBULATORY_CARE_PROVIDER_SITE_OTHER): Payer: Self-pay | Admitting: Orthopaedic Surgery

## 2017-06-14 ENCOUNTER — Ambulatory Visit (INDEPENDENT_AMBULATORY_CARE_PROVIDER_SITE_OTHER): Payer: Medicare Other | Admitting: Urology

## 2017-06-14 DIAGNOSIS — R338 Other retention of urine: Secondary | ICD-10-CM

## 2017-06-19 ENCOUNTER — Ambulatory Visit (INDEPENDENT_AMBULATORY_CARE_PROVIDER_SITE_OTHER): Payer: Self-pay

## 2017-06-19 ENCOUNTER — Ambulatory Visit (INDEPENDENT_AMBULATORY_CARE_PROVIDER_SITE_OTHER): Payer: Medicare Other | Admitting: Orthopaedic Surgery

## 2017-06-19 ENCOUNTER — Encounter (INDEPENDENT_AMBULATORY_CARE_PROVIDER_SITE_OTHER): Payer: Self-pay | Admitting: Orthopaedic Surgery

## 2017-06-19 VITALS — BP 148/88 | HR 102 | Resp 18 | Ht 68.0 in | Wt 160.0 lb

## 2017-06-19 DIAGNOSIS — M5441 Lumbago with sciatica, right side: Secondary | ICD-10-CM

## 2017-06-19 DIAGNOSIS — G8929 Other chronic pain: Secondary | ICD-10-CM

## 2017-06-19 DIAGNOSIS — M25561 Pain in right knee: Secondary | ICD-10-CM

## 2017-06-19 MED ORDER — BUPIVACAINE HCL 0.5 % IJ SOLN
2.0000 mL | INTRAMUSCULAR | Status: AC | PRN
  Administered 2017-06-19: 2 mL via INTRA_ARTICULAR

## 2017-06-19 MED ORDER — LIDOCAINE HCL 1 % IJ SOLN
2.0000 mL | INTRAMUSCULAR | Status: AC | PRN
  Administered 2017-06-19: 2 mL

## 2017-06-19 MED ORDER — METHYLPREDNISOLONE ACETATE 40 MG/ML IJ SUSP
80.0000 mg | INTRAMUSCULAR | Status: AC | PRN
  Administered 2017-06-19: 80 mg

## 2017-06-19 NOTE — Progress Notes (Signed)
Office Visit Note   Patient: Anthony Dunlap           Date of Birth: 01/31/1934           MRN: [id number] Visit Date: 06/19/2017              Requested by: [name], [place], [place] [address] PCP: [name], MD   Assessment & Plan: Visit Diagnoses:  1. Chronic pain of right knee   2. 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  Right knee presently causing most of the pain.  Will inject with cortisone.  Long discussion with Anthony Dunlap and his daughter regarding the diagnosis treatment options.  We will reevaluate in 3 to 4 weeks and consider cortisone injection right hip.  This visit 45 minutes discussing all of the above.  Over 50% of the time was in counseling and discussing different treatment options.  Will focus on the right knee today as  it is the most uncomfortable joint. Follow-Up Instructions: Return if symptoms worsen or fail to improve.   Orders:  Orders Placed This Encounter  Procedures  . Large Joint Inj: R knee  . XR Lumbar Spine 2-3 Views  . XR Pelvis 1-2 Views  . XR KNEE 3 VIEW RIGHT   No orders of the defined types were placed in this encounter.     Procedures: Large Joint Inj: R knee on 06/19/2017 11:01 AM Indications: pain and diagnostic evaluation Details: 25 G 1.5 in needle, anteromedial approach  Arthrogram: No  Medications: 2 mL lidocaine 1 %; 2 mL bupivacaine 0.5 %; 80 mg methylPREDNISolone acetate 40 MG/ML Procedure, treatment alternatives, risks and benefits explained, specific risks discussed. Consent was given by the patient. Immediately prior to procedure a time out was called to verify the correct patient, procedure, equipment, support staff and site/side marked as required. Patient was prepped and draped in the usual sterile fashion.       Clinical Data: No additional findings.   Subjective: Chief Complaint    Patient presents with  . Right Knee - Pain  . Left Hip - Pain  . New Patient (Initial Visit)    OVER 1 YEAR R KNEE GOES STIFF AND RADIATES UP TO LEFT HIP PAIN, NO POPPING OR GIVING AWAY  Anthony Dunlap is 84 years old cup it by his daughter and being evaluated for problems having with his right hip, right knee and lumbar spine.  He is been experiencing progressive pain over over 1 to 2 years.  He has had some mild back pain but predominately having right groin discomfort with limitation of motion.  He also experiences some recurrent swelling and pain in his right knee and believes that he is probably had a cortisone injection in the past.  He does have a history of prostate cancer with radioactive seed implant.  He also has some dysfunction with the bladder and has an indwelling suprapubic catheter. Tissue today appears to be with his right knee is stiff in the morning clicking and occasional sense of giving way.  He notes his pain to be about a 9/10.  He has used BenGay and ice. History of diabetes mellitus-non-insulin-dependent.   HPI  Review of Systems  Constitutional: Negative for fever.  HENT: Negative for ear pain.   Eyes: Negative for pain.  Respiratory: Negative for cough.   Cardiovascular: Positive for leg swelling.  Gastrointestinal: Negative for constipation.  Genitourinary: Positive for difficulty urinating.  Musculoskeletal: Positive for back pain. Negative for neck pain.  Skin: Negative for rash.  Allergic/Immunologic: Negative for food allergies.  Neurological: Positive for weakness and numbness.  Psychiatric/Behavioral: Positive for sleep disturbance.     Objective: Vital Signs: BP (!) 148/88 (BP Location: Left Arm, Patient Position: Sitting, Cuff Size: Normal)   Pulse (!) 102   Resp 18   Ht 5\' 8"  (1.727 m)   Wt 160 lb (72.6 kg)   BMI 24.33 kg/m   Physical Exam  Constitutional: He is oriented to person, place, and time. He appears well-developed and well-nourished.   HENT:  Mouth/Throat: Oropharynx is clear and moist.  Eyes: Pupils are equal, round, and reactive to light. EOM are normal.  Pulmonary/Chest: Effort normal.  Neurological: He is alert and oriented to person, place, and time.  Skin: Skin is warm and dry.  Psychiatric: He has a normal mood and affect. His behavior is normal.    Ortho Exam awake alert and oriented x3.  Comfortable sitting.  Has collection bag from the suprapubic catheter attached to his left thigh.  Examination of his right knee reveals no effusion.  Mild medial lateral joint pain.  Positive patellar crepitation.  Lacks a few degrees to full extension but flexes at least 110 degrees.  No calf pain.  No swelling distally.  Good capillary refill to toes.  Little if any motion of his right hip from a neutral position with pain any motion.  Straight leg raise negative.  No percussible back pain.  Specialty Comments:  No specialty comments available.  Imaging: Xr Knee 3 View Right  Result Date: 06/19/2017 Numbness of the right knee were obtained in 3 projections standing.  There is about 2 to 3 degrees of varus.  Diffuse advanced osteoarthritis in all 3 compartments with areas of ectopic calcification.  There are peripheral osteophytes  in all 3 compartments.  Ectopic calcification identified within the distal quadriceps mechanism and in the joint. vascular calcification identified.  Sister with end-stage osteoarthritis.  Could have  an element of chondrocalcinosis  Xr Lumbar Spine 2-3 Views  Result Date: 06/19/2017 Lumbar spine were obtained in several projections.  There is a degenerative scoliosis approximately 5 degrees to the left.  Diffuse bowel gas obliterates much of the detail.  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  Changes noted in the facet joints at L3 445 and 5 1.  Slight anterior listhesis of L4 on 5.  Some decrease in the disc space height.  Calcification along the anterior  longitudinal ligament anteriorly at L1 to identified.  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  There is little if any joint space remaining with large subchondral cysts on both sides of the joint.  Also has prior radioactive seed implant for prostate cancer.  Minimal degenerative changes on the left side    PMFS History: Patient Active Problem List   Diagnosis Date Noted  . 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 Other and unspecified hyperlipidemia 08/19/2013  . Diabetes ([place])   . Prostate cancer [facility])    Past Medical History:  Diagnosis Date  . Diabetes ([place])   . Left eye pain   . Prostate cancer [facility])     Family History  Problem Relation Age of Onset  . Pneumonia Father   . Cancer Sister  Past Surgical History:  Procedure Laterality Date  . HERNIA REPAIR    . PROSTATE SURGERY     Social History   Occupational History    Employer: RETIRED    Comment: Retired  Tobacco Use  . Smoking status: Never Smoker  . Smokeless tobacco: Never Used  Substance and Sexual Activity  . Alcohol use: No  . Drug use: No  . Sexual activity: Not on file

## 2017-07-16 ENCOUNTER — Ambulatory Visit (INDEPENDENT_AMBULATORY_CARE_PROVIDER_SITE_OTHER): Payer: Medicare Other | Admitting: Urology

## 2017-07-16 DIAGNOSIS — R338 Other retention of urine: Secondary | ICD-10-CM | POA: Diagnosis not present

## 2017-08-16 ENCOUNTER — Ambulatory Visit (INDEPENDENT_AMBULATORY_CARE_PROVIDER_SITE_OTHER): Payer: Medicare Other | Admitting: Urology

## 2017-08-16 DIAGNOSIS — R338 Other retention of urine: Secondary | ICD-10-CM

## 2017-09-18 ENCOUNTER — Ambulatory Visit (INDEPENDENT_AMBULATORY_CARE_PROVIDER_SITE_OTHER): Payer: Medicare Other | Admitting: Urology

## 2017-09-18 DIAGNOSIS — R338 Other retention of urine: Secondary | ICD-10-CM | POA: Diagnosis not present

## 2017-10-02 ENCOUNTER — Telehealth (INDEPENDENT_AMBULATORY_CARE_PROVIDER_SITE_OTHER): Payer: Self-pay | Admitting: Orthopaedic Surgery

## 2017-10-02 NOTE — Telephone Encounter (Signed)
PLEASE ADVISE.

## 2017-10-02 NOTE — Telephone Encounter (Signed)
Patients daughter calling for patient. Patient is still having pain with hip and knee. Patient wants to schedule to have an injection at [facility] in [place]. Please call to advise daughter which location [place] or [place].

## 2017-10-02 NOTE — Telephone Encounter (Signed)
Cortisone injection right hip at [facility] or [place]-whichever is more convenient

## 2017-10-03 ENCOUNTER — Other Ambulatory Visit (INDEPENDENT_AMBULATORY_CARE_PROVIDER_SITE_OTHER): Payer: Self-pay | Admitting: Radiology

## 2017-10-03 DIAGNOSIS — M25551 Pain in right hip: Secondary | ICD-10-CM

## 2017-10-03 NOTE — Telephone Encounter (Signed)
Order sent into [place] and I advised pts wife we were putting in the order

## 2017-10-15 ENCOUNTER — Encounter (HOSPITAL_COMMUNITY): Payer: Self-pay

## 2017-10-15 ENCOUNTER — Ambulatory Visit (HOSPITAL_COMMUNITY)
Admission: RE | Admit: 2017-10-15 | Discharge: 2017-10-15 | Disposition: A | Discharge status: Home / Self Care | Payer: Medicare Other | Source: Ambulatory Visit | Attending: Orthopaedic Surgery | Admitting: Orthopaedic Surgery

## 2017-10-15 ENCOUNTER — Telehealth (INDEPENDENT_AMBULATORY_CARE_PROVIDER_SITE_OTHER): Payer: Self-pay

## 2017-10-15 DIAGNOSIS — M25551 Pain in right hip: Secondary | ICD-10-CM | POA: Diagnosis present

## 2017-10-15 DIAGNOSIS — G8929 Other chronic pain: Secondary | ICD-10-CM | POA: Insufficient documentation

## 2017-10-15 MED ORDER — LIDOCAINE HCL (PF) 1 % IJ SOLN
INTRAMUSCULAR | Status: AC
  Administered 2017-10-15: 5 mL via INTRA_ARTICULAR
  Filled 2017-10-15: qty 10

## 2017-10-15 MED ORDER — POVIDONE-IODINE 10 % EX SOLN
CUTANEOUS | Status: AC
  Administered 2017-10-15: 1
  Filled 2017-10-15: qty 15

## 2017-10-15 MED ORDER — IOPAMIDOL (ISOVUE-300) INJECTION 61%
INTRAVENOUS | Status: AC
  Administered 2017-10-15: 1.5 mL via INTRA_ARTICULAR
  Filled 2017-10-15: qty 50

## 2017-10-15 MED ORDER — METHYLPREDNISOLONE ACETATE 40 MG/ML IJ SUSP
INTRAMUSCULAR | Status: AC
  Administered 2017-10-15: 40 mg via INTRA_ARTICULAR
  Filled 2017-10-15: qty 1

## 2017-10-15 NOTE — Procedures (Signed)
Preprocedure Dx: RT hip pain Postprocedure Dx: RT hip pain Procedure  Fluoroscopically guided therapeutic RT hip joint injection Radiologist:  Thornton Papas Anesthesia:  4 ml of 1% lidocaine Injectate:  1.5 ml Isovue-300; 40 mg Depo-Medrol, 3 ml 1% lidocaine Fluoro time:  0 minutes 36 seconds EBL:   None Complications: None

## 2017-10-15 NOTE — Telephone Encounter (Signed)
Dr Thornton Papas, radiologist from [facility] called stating patient there for appointment now but needing clarification on orders. He said based on information in chart looks like patient should just be receiving cortisone injection in his hip but patient is scheduled for MRI as well. Does patient need both? Please call him ASAP to advise since patient is there now waiting for procedure Please call [phone] and if you cannot reach him there, please call [phone]

## 2017-10-15 NOTE — Telephone Encounter (Signed)
Spoke with Dr Delbert Phenix to verify orders.

## 2017-10-23 ENCOUNTER — Ambulatory Visit (INDEPENDENT_AMBULATORY_CARE_PROVIDER_SITE_OTHER): Payer: Medicare Other | Admitting: Urology

## 2017-10-23 DIAGNOSIS — Z8546 Personal history of malignant neoplasm of prostate: Secondary | ICD-10-CM

## 2017-10-23 DIAGNOSIS — R338 Other retention of urine: Secondary | ICD-10-CM

## 2017-11-27 ENCOUNTER — Ambulatory Visit (INDEPENDENT_AMBULATORY_CARE_PROVIDER_SITE_OTHER): Payer: Medicare Other | Admitting: Urology

## 2017-11-27 DIAGNOSIS — R338 Other retention of urine: Secondary | ICD-10-CM | POA: Diagnosis not present

## 2017-12-31 ENCOUNTER — Ambulatory Visit (INDEPENDENT_AMBULATORY_CARE_PROVIDER_SITE_OTHER): Payer: Medicare Other | Admitting: Urology

## 2017-12-31 DIAGNOSIS — Z8546 Personal history of malignant neoplasm of prostate: Secondary | ICD-10-CM

## 2018-01-03 ENCOUNTER — Emergency Department (HOSPITAL_COMMUNITY): Payer: Medicare Other

## 2018-01-03 ENCOUNTER — Other Ambulatory Visit: Payer: Self-pay

## 2018-01-03 ENCOUNTER — Encounter (HOSPITAL_COMMUNITY): Payer: Self-pay | Admitting: Emergency Medicine

## 2018-01-03 ENCOUNTER — Inpatient Hospital Stay (HOSPITAL_COMMUNITY)
Admission: EM | Admit: 2018-01-03 | Discharge: 2018-01-07 | DRG: 698 | Disposition: A | Discharge status: Home / Self Care | Payer: Medicare Other | Attending: Internal Medicine | Admitting: Internal Medicine

## 2018-01-03 DIAGNOSIS — D638 Anemia in other chronic diseases classified elsewhere: Secondary | ICD-10-CM | POA: Diagnosis present

## 2018-01-03 DIAGNOSIS — Z7982 Long term (current) use of aspirin: Secondary | ICD-10-CM

## 2018-01-03 DIAGNOSIS — R319 Hematuria, unspecified: Secondary | ICD-10-CM | POA: Diagnosis present

## 2018-01-03 DIAGNOSIS — R5381 Other malaise: Secondary | ICD-10-CM

## 2018-01-03 DIAGNOSIS — R748 Abnormal levels of other serum enzymes: Secondary | ICD-10-CM

## 2018-01-03 DIAGNOSIS — E119 Type 2 diabetes mellitus without complications: Secondary | ICD-10-CM

## 2018-01-03 DIAGNOSIS — Z8546 Personal history of malignant neoplasm of prostate: Secondary | ICD-10-CM | POA: Diagnosis not present

## 2018-01-03 DIAGNOSIS — C61 Malignant neoplasm of prostate: Secondary | ICD-10-CM | POA: Diagnosis present

## 2018-01-03 DIAGNOSIS — Z79890 Hormone replacement therapy (postmenopausal): Secondary | ICD-10-CM | POA: Diagnosis not present

## 2018-01-03 DIAGNOSIS — E1165 Type 2 diabetes mellitus with hyperglycemia: Secondary | ICD-10-CM | POA: Diagnosis present

## 2018-01-03 DIAGNOSIS — E785 Hyperlipidemia, unspecified: Secondary | ICD-10-CM

## 2018-01-03 DIAGNOSIS — Y846 Urinary catheterization as the cause of abnormal reaction of the patient, or of later complication, without mention of misadventure at the time of the procedure: Secondary | ICD-10-CM | POA: Diagnosis present

## 2018-01-03 DIAGNOSIS — T83511A Infection and inflammatory reaction due to indwelling urethral catheter, initial encounter: Secondary | ICD-10-CM | POA: Diagnosis not present

## 2018-01-03 DIAGNOSIS — Z978 Presence of other specified devices: Secondary | ICD-10-CM

## 2018-01-03 DIAGNOSIS — E039 Hypothyroidism, unspecified: Secondary | ICD-10-CM | POA: Diagnosis present

## 2018-01-03 DIAGNOSIS — Z960 Presence of urogenital implants: Secondary | ICD-10-CM

## 2018-01-03 DIAGNOSIS — E876 Hypokalemia: Secondary | ICD-10-CM | POA: Diagnosis not present

## 2018-01-03 DIAGNOSIS — E11649 Type 2 diabetes mellitus with hypoglycemia without coma: Secondary | ICD-10-CM | POA: Diagnosis not present

## 2018-01-03 DIAGNOSIS — N390 Urinary tract infection, site not specified: Secondary | ICD-10-CM

## 2018-01-03 DIAGNOSIS — D649 Anemia, unspecified: Secondary | ICD-10-CM

## 2018-01-03 DIAGNOSIS — Z794 Long term (current) use of insulin: Secondary | ICD-10-CM | POA: Diagnosis not present

## 2018-01-03 DIAGNOSIS — A419 Sepsis, unspecified organism: Secondary | ICD-10-CM | POA: Diagnosis present

## 2018-01-03 DIAGNOSIS — T83511D Infection and inflammatory reaction due to indwelling urethral catheter, subsequent encounter: Secondary | ICD-10-CM | POA: Diagnosis not present

## 2018-01-03 LAB — CBC WITH DIFFERENTIAL/PLATELET
ABS IMMATURE GRANULOCYTES: 0.04 10*3/uL (ref 0.00–0.07)
BASOS ABS: 0 10*3/uL (ref 0.0–0.1)
Basophils Relative: 0 %
Eosinophils Absolute: 0.2 10*3/uL (ref 0.0–0.5)
Eosinophils Relative: 3 %
HEMATOCRIT: 30.9 % — AB (ref 39.0–52.0)
HEMOGLOBIN: 9.2 g/dL — AB (ref 13.0–17.0)
IMMATURE GRANULOCYTES: 1 %
LYMPHS PCT: 3 %
Lymphs Abs: 0.3 10*3/uL — ABNORMAL LOW (ref 0.7–4.0)
MCH: 24.8 pg — ABNORMAL LOW (ref 26.0–34.0)
MCHC: 29.8 g/dL — ABNORMAL LOW (ref 30.0–36.0)
MCV: 83.3 fL (ref 80.0–100.0)
Monocytes Absolute: 0.3 10*3/uL (ref 0.1–1.0)
Monocytes Relative: 3 %
NEUTROS ABS: 8 10*3/uL — AB (ref 1.7–7.7)
NEUTROS PCT: 90 %
NRBC: 0 % (ref 0.0–0.2)
Platelets: 283 10*3/uL (ref 150–400)
RBC: 3.71 MIL/uL — ABNORMAL LOW (ref 4.22–5.81)
RDW: 13.6 % (ref 11.5–15.5)
WBC: 8.9 10*3/uL (ref 4.0–10.5)

## 2018-01-03 LAB — COMPREHENSIVE METABOLIC PANEL
ALBUMIN: 3.1 g/dL — AB (ref 3.5–5.0)
ALT: 19 U/L (ref 0–44)
ANION GAP: 12 (ref 5–15)
AST: 26 U/L (ref 15–41)
Alkaline Phosphatase: 132 U/L — ABNORMAL HIGH (ref 38–126)
BUN: 23 mg/dL (ref 8–23)
CHLORIDE: 98 mmol/L (ref 98–111)
CO2: 21 mmol/L — AB (ref 22–32)
Calcium: 8.4 mg/dL — ABNORMAL LOW (ref 8.9–10.3)
Creatinine, Ser: 1.07 mg/dL (ref 0.61–1.24)
GFR calc Af Amer: >60 mL/min (ref >60)
GFR calc non Af Amer: >60 mL/min (ref >60)
GLUCOSE: 359 mg/dL — AB (ref 70–99)
POTASSIUM: 3.9 mmol/L (ref 3.5–5.1)
SODIUM: 131 mmol/L — AB (ref 135–145)
Total Bilirubin: 1 mg/dL (ref 0.3–1.2)
Total Protein: 7.8 g/dL (ref 6.5–8.1)

## 2018-01-03 LAB — URINALYSIS, ROUTINE W REFLEX MICROSCOPIC
BILIRUBIN URINE: NEGATIVE
Glucose, UA: >=500 mg/dL — AB
Ketones, ur: NEGATIVE mg/dL
Nitrite: NEGATIVE
PH: 6 (ref 5.0–8.0)
Protein, ur: 100 mg/dL — AB
Specific Gravity, Urine: 1.022 (ref 1.005–1.030)
WBC, UA: >50 WBC/hpf — ABNORMAL HIGH (ref 0–5)

## 2018-01-03 LAB — PROTIME-INR
INR: 1.2
Prothrombin Time: 15.1 seconds (ref 11.4–15.2)

## 2018-01-03 LAB — I-STAT CG4 LACTIC ACID, ED
Lactic Acid, Venous: 2.23 mmol/L (ref 0.5–1.9)
Lactic Acid, Venous: 1.35 mmol/L (ref 0.5–1.9)

## 2018-01-03 MED ORDER — SODIUM CHLORIDE 0.9 % IV SOLN
1.0000 g | INTRAVENOUS | Status: DC
  Administered 2018-01-04 – 2018-01-05 (×2): 1 g via INTRAVENOUS
  Filled 2018-01-03: qty 10
  Filled 2018-01-03 (×2): qty 1
  Filled 2018-01-03: qty 10

## 2018-01-03 MED ORDER — ACETAMINOPHEN 650 MG RE SUPP: 650.0000 mg | Freq: Four times a day (QID) | RECTAL | Status: DC | PRN

## 2018-01-03 MED ORDER — LEVOTHYROXINE SODIUM 50 MCG PO TABS
25.0000 ug | ORAL_TABLET | Freq: Every day | ORAL | Status: DC
  Administered 2018-01-04 – 2018-01-07 (×4): 25 ug via ORAL
  Filled 2018-01-03 (×4): qty 1

## 2018-01-03 MED ORDER — SODIUM CHLORIDE 0.9 % IV SOLN
1.0000 g | Freq: Once | INTRAVENOUS | Status: AC
  Administered 2018-01-03: 1 g via INTRAVENOUS
  Filled 2018-01-03: qty 10

## 2018-01-03 MED ORDER — PRAVASTATIN SODIUM 10 MG PO TABS
10.0000 mg | ORAL_TABLET | Freq: Every day | ORAL | Status: DC
  Administered 2018-01-04 – 2018-01-07 (×4): 10 mg via ORAL
  Filled 2018-01-03 (×4): qty 1

## 2018-01-03 MED ORDER — ACETAMINOPHEN 325 MG PO TABS: 650.0000 mg | ORAL_TABLET | Freq: Four times a day (QID) | ORAL | Status: DC | PRN

## 2018-01-03 MED ORDER — INSULIN ASPART 100 UNIT/ML ~~LOC~~ SOLN
0.0000 [IU] | Freq: Three times a day (TID) | SUBCUTANEOUS | Status: DC
  Administered 2018-01-04 (×3): 1 [IU] via SUBCUTANEOUS
  Administered 2018-01-05: 2 [IU] via SUBCUTANEOUS
  Administered 2018-01-05: 3 [IU] via SUBCUTANEOUS
  Administered 2018-01-06: 2 [IU] via SUBCUTANEOUS
  Administered 2018-01-06: 3 [IU] via SUBCUTANEOUS
  Administered 2018-01-07 (×2): 2 [IU] via SUBCUTANEOUS

## 2018-01-03 MED ORDER — INSULIN DETEMIR 100 UNIT/ML ~~LOC~~ SOLN
30.0000 [IU] | Freq: Two times a day (BID) | SUBCUTANEOUS | Status: DC
  Administered 2018-01-04 – 2018-01-05 (×2): 30 [IU] via SUBCUTANEOUS
  Filled 2018-01-03: qty 0.3
  Filled 2018-01-03: qty 3

## 2018-01-03 MED ORDER — SODIUM CHLORIDE 0.9 % IV SOLN
INTRAVENOUS | Status: AC
  Administered 2018-01-04: 01:00:00 via INTRAVENOUS

## 2018-01-03 MED ORDER — SODIUM CHLORIDE 0.9 % IV BOLUS
2000.0000 mL | Freq: Once | INTRAVENOUS | Status: AC
  Administered 2018-01-03: 2000 mL via INTRAVENOUS

## 2018-01-03 NOTE — ED Provider Notes (Signed)
[facility] EMERGENCY DEPARTMENT Provider Note   CSN: [id number] Arrival date & time: 01/03/18  3790     History   Chief Complaint Chief Complaint  Patient presents with  . Fever    HPI Anthony Dunlap is a 84 y.o. male.  Patient complains of fevers and chills.  Patient has an indwelling Foley for prostate cancer  The history is provided by the patient. No language interpreter was used.  Fever   This is a new problem. The current episode started 12 to 24 hours ago. The problem occurs constantly. The problem has not changed since onset.The maximum temperature noted was 103 to 104 F. Pertinent negatives include no chest pain, no diarrhea, no congestion, no headaches and no cough. He has tried nothing for the symptoms. The treatment provided no relief.    Past Medical History:  Diagnosis Date  . Diabetes ([place])   . Left eye pain   . Prostate cancer [facility])     Patient Active Problem List   Diagnosis Date Noted  . 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 Other and unspecified hyperlipidemia 08/19/2013  . Diabetes ([place])   . Prostate cancer [facility])     Past Surgical History:  Procedure Laterality Date  . HERNIA REPAIR    . PROSTATE SURGERY          Home Medications    Prior to Admission medications   Medication Sig Start Date End Date Taking? Authorizing Provider  aspirin EC 81 MG tablet Take 81 mg by mouth daily.   Yes [provider]  LEVEMIR FLEXTOUCH 100 UNIT/ML Pen Inject 38-39 Units into the muscle 2 (two) times daily.  09/28/16  Yes [provider]  levothyroxine (SYNTHROID, LEVOTHROID) 25 MCG tablet Take 25 mcg by mouth daily before breakfast.   Yes [provider]  pravastatin (PRAVACHOL) 10 MG tablet Take 10 mg by mouth daily.   Yes [provider]    Family History Family History  Problem Relation Age of Onset  . Pneumonia Father   . Cancer Sister     Social History Social History   Tobacco Use  . Smoking  status: Never Smoker  . Smokeless tobacco: Never Used  Substance Use Topics  . Alcohol use: No  . Drug use: No     Allergies   Patient has no known allergies.   Review of Systems Review of Systems  Constitutional: Positive for fever. Negative for appetite change and fatigue.  HENT: Negative for congestion, ear discharge and sinus pressure.   Eyes: Negative for discharge.  Respiratory: Negative for cough.   Cardiovascular: Negative for chest pain.  Gastrointestinal: Negative for abdominal pain and diarrhea.  Genitourinary: Negative for frequency and hematuria.  Musculoskeletal: Negative for back pain.  Skin: Negative for rash.  Neurological: Negative for seizures and headaches.  Psychiatric/Behavioral: Negative for hallucinations.     Physical Exam Updated Vital Signs BP 112/65   Pulse 94   Temp (!) 100.6 F (38.1 C) (Oral)   Resp 20   Ht 5\' 8"  (1.727 m)   Wt 74.8 kg   BMI 25.09 kg/m   Physical Exam  Constitutional: He is oriented to person, place, and time. He appears well-developed.  HENT:  Head: Normocephalic.  Eyes: Conjunctivae and EOM are normal. No scleral icterus.  Neck: Neck supple. No thyromegaly present.  Cardiovascular: Normal rate and regular rhythm. Exam reveals no gallop and no friction rub.  No murmur heard. Pulmonary/Chest: No stridor. He has  no wheezes. He has no rales. He exhibits no tenderness.  Abdominal: He exhibits no distension. There is no tenderness. There is no rebound.  Musculoskeletal: Normal range of motion. He exhibits no edema.  Lymphadenopathy:    He has no cervical adenopathy.  Neurological: He is oriented to person, place, and time. He exhibits normal muscle tone. Coordination normal.  Skin: No rash noted. No erythema.  Psychiatric: He has a normal mood and affect. His behavior is normal.     ED Treatments / Results  Labs (all labs ordered are listed, but only abnormal results are displayed) Labs Reviewed  COMPREHENSIVE  METABOLIC PANEL - Abnormal; Notable for the following components:      Result Value   Sodium 131 (*)    CO2 21 (*)    Glucose, Bld 359 (*)    Calcium 8.4 (*)    Albumin 3.1 (*)    Alkaline Phosphatase 132 (*)    All other components within normal limits  CBC WITH DIFFERENTIAL/PLATELET - Abnormal; Notable for the following components:   RBC 3.71 (*)    Hemoglobin 9.2 (*)    HCT 30.9 (*)    MCH 24.8 (*)    MCHC 29.8 (*)    Neutro Abs 8.0 (*)    Lymphs Abs 0.3 (*)    All other components within normal limits  URINALYSIS, ROUTINE W REFLEX MICROSCOPIC - Abnormal; Notable for the following components:   Color, Urine AMBER (*)    APPearance CLOUDY (*)    Glucose, UA >=500 (*)    Hgb urine dipstick MODERATE (*)    Protein, ur 100 (*)    Leukocytes, UA LARGE (*)    RBC / HPF >50 (*)    WBC, UA >50 (*)    Bacteria, UA MANY (*)    All other components within normal limits  I-STAT CG4 LACTIC ACID, ED - Abnormal; Notable for the following components:   Lactic Acid, Venous 2.23 (*)    All other components within normal limits  CULTURE, BLOOD (ROUTINE X 2)  CULTURE, BLOOD (ROUTINE X 2)  PROTIME-INR  I-STAT CG4 LACTIC ACID, ED    EKG None  Radiology Dg Chest 2 View  Result Date: 01/03/2018 CLINICAL DATA:  Sepsis, fever, no pain EXAM: CHEST - 2 VIEW COMPARISON:  09/26/2007 FINDINGS: There is no focal parenchymal opacity. There is no pleural effusion or pneumothorax. The heart and mediastinal contours are unremarkable. There is mild osteoarthritis of bilateral glenohumeral joints, left greater than right. IMPRESSION: No active cardiopulmonary disease. Electronically Signed   By: Kathreen Devoid   On: 01/03/2018 20:23    Procedures Procedures (including critical care time)  Medications Ordered in ED Medications  sodium chloride 0.9 % bolus 2,000 mL (2,000 mLs Intravenous New Bag/Given 01/03/18 2027)  cefTRIAXone (ROCEPHIN) 1 g in sodium chloride 0.9 % 100 mL IVPB ( Intravenous Stopped  01/03/18 2103)     Initial Impression / Assessment and Plan / ED Course  I have reviewed the triage vital signs and the nursing notes.  Pertinent labs & imaging results that were available during my care of the patient were reviewed by me and considered in my medical decision making (see chart for details).     Viral illness with urinary tract infection.  Patient will be admitted to medicine and started on IV antibiotics  Final Clinical Impressions(s) / ED Diagnoses   Final diagnoses:  Urinary tract infection with hematuria, site unspecified    ED Discharge Orders  None       Milton Ferguson, MD 01/03/18 2159

## 2018-01-03 NOTE — H&P (Addendum)
History and Physical    Anthony Dunlap EXN:[id number] DOB: 11-Aug-1933 DOA: 01/03/2018  PCP: [name], MD Patient coming from: Home  Chief Complaint: Fever  HPI: Anthony Dunlap is a 84 y.o. male with medical history significant of chronic indwelling Foley catheter secondary to history of prostate cancer, hypothyroidism, hyperlipidemia presenting to the hospital for evaluation of fever.  Patient reports having chills this evening and states he feels fatigued.  Denies having any fevers.  Reports having decreased p.o. intake.  Denies having any abdominal pain, nausea, vomiting, or diarrhea.  Denies having any cough or shortness of breath.  Denies having any hematemesis, hematochezia, or melena.  States his urine looks dark sometimes.  States he had a Foley catheter initially placed over 6 months ago since his bladder was not emptying.  He is followed by urology and gets monthly Foley catheter changes.  ED Course: Temperature 100.6, tachycardic, tachypneic, and blood pressure stable.  No leukocytosis.  Lactic acid 2.23, repeat normal after IV fluid resuscitation. UA suggestive of infection.  Chest x-ray showing no active cardiopulmonary disease.  Patient received ceftriaxone and 2 L IV fluid boluses.  Review of Systems: As per HPI otherwise 10 point review of systems negative.  Past Medical History:  Diagnosis Date  . Diabetes ([place])   . Left eye pain   . Prostate cancer [facility])     Past Surgical History:  Procedure Laterality Date  . HERNIA REPAIR    . PROSTATE SURGERY       reports that he has never smoked. He has never used smokeless tobacco. He reports that he does not drink alcohol or use drugs.  No Known Allergies  Family History  Problem Relation Age of Onset  . Pneumonia Father   . Cancer Sister     Prior to Admission medications   Medication Sig Start Date End Date Taking? Authorizing Provider  aspirin EC 81 MG tablet Take 81 mg by mouth daily.   Yes [provider]  LEVEMIR FLEXTOUCH 100 UNIT/ML Pen Inject 38-39 Units into the muscle 2 (two) times daily.  09/28/16  Yes [provider]  levothyroxine (SYNTHROID, LEVOTHROID) 25 MCG tablet Take 25 mcg by mouth daily before breakfast.   Yes [provider]  pravastatin (PRAVACHOL) 10 MG tablet Take 10 mg by mouth daily.   Yes [provider]    Physical Exam: Vitals:   01/03/18 2200 01/03/18 2230 01/03/18 2245 01/03/18 2300  BP: 109/70 103/62  113/67  Pulse: 88 88 85 83  Resp: (!) 24 20 (!) 23 18  Temp:      TempSrc:      SpO2: 97%     Weight:      Height:        Physical Exam  Constitutional: He is oriented to person, place, and time. No distress.  Resting comfortably in a hospital stretcher  HENT:  Head: Normocephalic.  Dry mucous membranes  Eyes: Right eye exhibits no discharge. Left eye exhibits no discharge.  Neck: Neck supple. No tracheal deviation present.  Cardiovascular: Normal rate, regular rhythm and intact distal pulses.  Pulmonary/Chest: Effort normal. No respiratory distress. He has no wheezes. He has no rales.  Abdominal: Soft. Bowel sounds are normal. He exhibits no distension. There is no tenderness. There is no guarding.  Musculoskeletal: He exhibits no edema.  Neurological: He is alert and oriented to person, place, and time.  Skin: Skin is warm and dry. He is not diaphoretic.  Labs on Admission: I have personally reviewed following labs and imaging studies  CBC: Recent Labs  Lab 01/03/18 1929  WBC 8.9  NEUTROABS 8.0*  HGB 9.2*  HCT 30.9*  MCV 83.3  PLT 409   Basic Metabolic Panel: Recent Labs  Lab 01/03/18 1929  NA 131*  K 3.9  CL 98  CO2 21*  GLUCOSE 359*  BUN 23  CREATININE 1.07  CALCIUM 8.4*   GFR: Estimated Creatinine Clearance: 51.5 mL/min (by C-G formula based on SCr of 1.07 mg/dL). Liver Function Tests: Recent Labs  Lab 01/03/18 1929  AST 26  ALT 19  ALKPHOS 132*  BILITOT 1.0  PROT 7.8    ALBUMIN 3.1*   No results for input(s): LIPASE, AMYLASE in the last 168 hours. No results for input(s): AMMONIA in the last 168 hours. Coagulation Profile: Recent Labs  Lab 01/03/18 1929  INR 1.20   Cardiac Enzymes: No results for input(s): CKTOTAL, CKMB, CKMBINDEX, TROPONINI in the last 168 hours. BNP (last 3 results) No results for input(s): PROBNP in the last 8760 hours. HbA1C: No results for input(s): HGBA1C in the last 72 hours. CBG: No results for input(s): GLUCAP in the last 168 hours. Lipid Profile: No results for input(s): CHOL, HDL, LDLCALC, TRIG, CHOLHDL, LDLDIRECT in the last 72 hours. Thyroid Function Tests: No results for input(s): TSH, T4TOTAL, FREET4, T3FREE, THYROIDAB in the last 72 hours. Anemia Panel: No results for input(s): VITAMINB12, FOLATE, FERRITIN, TIBC, IRON, RETICCTPCT in the last 72 hours. Urine analysis:    Component Value Date/Time   COLORURINE AMBER (A) 01/03/2018 2040   APPEARANCEUR CLOUDY (A) 01/03/2018 2040   LABSPEC 1.022 01/03/2018 2040   PHURINE 6.0 01/03/2018 2040   GLUCOSEU >=500 (A) 01/03/2018 2040   HGBUR MODERATE (A) 01/03/2018 2040   BILIRUBINUR NEGATIVE 01/03/2018 2040   KETONESUR NEGATIVE 01/03/2018 2040   PROTEINUR 100 (A) 01/03/2018 2040   UROBILINOGEN 0.2 09/03/2013 0132   NITRITE NEGATIVE 01/03/2018 2040   LEUKOCYTESUR LARGE (A) 01/03/2018 2040    Radiological Exams on Admission: Dg Chest 2 View  Result Date: 01/03/2018 CLINICAL DATA:  Sepsis, fever, no pain EXAM: CHEST - 2 VIEW COMPARISON:  09/26/2007 FINDINGS: There is no focal parenchymal opacity. There is no pleural effusion or pneumothorax. The heart and mediastinal contours are unremarkable. There is mild osteoarthritis of bilateral glenohumeral joints, left greater than right. IMPRESSION: No active cardiopulmonary disease. Electronically Signed   By: Kathreen Devoid   On: 01/03/2018 20:23    EKG: Independently reviewed.  Sinus tachycardia (heart rate  116).  Assessment/Plan Principal Problem:   Sepsis ([place]) Active Problems:   Type 2 diabetes mellitus ([name])   Prostate cancer ([name])   HLD (hyperlipidemia)   UTI (urinary tract infection)   Chronic anemia   Chronic indwelling Foley catheter   Elevated alkaline phosphatase level   Hypothyroidism   Physical deconditioning   Sepsis 2/2 to suspected UTI in the setting of chronic indwelling Foley catheter due to history of prostate cancer -Temperature 100.6, tachycardic, and blood pressure stable on arrival.  No leukocytosis.  Initial lactic acid 2.23.  Repeat lactic acid level normal and heart rate normal after IV fluid resuscitation.  Although UA showing negative nitrite, it does show a large amount of leukocytes and many bacteria on microscopic examination.  Patient is at high risk for UTI considering chronic indwelling Foley catheter.   -Continue ceftriaxone -IV fluid resuscitation -Blood culture x2 pending -Urine culture pending -Tylenol PRN  Chronic anemia Hemoglobin 9.2 with normal  MCV, no recent baseline.  Hemoglobin was 12.1 a year ago.  Patient denies having any hematemesis, hematochezia, or melena.  UA with evidence of hematuria-moderate hemoglobin on dipstick and microscopic examination showing greater than 50 RBCs per high-power field. -Repeat CBC in a.m. -Check iron, TIBC, ferritin levels -Hold anticoagulation for DVT prophylaxis at this time  Type 2 diabetes Blood glucose 359.  Bicarb 21 and anion gap 12.  Labs not suggestive of DKA. -Check A1c level -Continue Levemir 30 units twice daily -Sliding scale insulin sensitive -CBG checks  Elevated alkaline phosphatase Alk phos mildly elevated at 132.  Remainder of LFTs normal. -Check GGT level to differentiate between hepatic versus extrahepatic cause of elevated alk phos  Hypothyroidism -Continue home Synthroid  Hyperlipidemia -Continue home pravastatin  Physical deconditioning -PT consult  DVT prophylaxis:  SCDs Code Status: Patient wishes to be full code. Family Communication: No family available. Disposition Plan: Anticipate discharge to home in 1 to 2 days. Consults called: None Admission status: It is my clinical opinion that admission to INPATIENT is reasonable and necessary in this 84 y.o. male . presenting with symptoms of chills, fatigue, concerning for UTI . in the context of PMH including: Chronic indwelling Foley catheter . with pertinent positives on physical exam including: Fever, tachycardia . and pertinent positives on radiographic and laboratory data including: UA with evidence of UTI . Workup and treatment include IV antibiotic and IV fluid.  Given the aforementioned, the predictability of an adverse outcome is felt to be significant. I expect that the patient will require at least 2 midnights in the hospital to treat this condition.   Shela Leff MD [facility] Pager [phone]  If 7PM-7AM, please contact night-coverage [email or web address] Password [facility]  01/03/2018, 11:38 PM

## 2018-01-03 NOTE — ED Triage Notes (Signed)
Pt comes from home via [facility]. Pt had axillary temp of 103. Pt given 500mg  NS, 1000mg  tylenol, 400 ibuprofen, and 1G rocephin IM. Pt does have chronic foley catheter due to prostate cancer. Pt denies pain at this time.

## 2018-01-04 ENCOUNTER — Encounter (HOSPITAL_COMMUNITY): Payer: Self-pay

## 2018-01-04 DIAGNOSIS — T83511D Infection and inflammatory reaction due to indwelling urethral catheter, subsequent encounter: Secondary | ICD-10-CM

## 2018-01-04 DIAGNOSIS — D649 Anemia, unspecified: Secondary | ICD-10-CM

## 2018-01-04 DIAGNOSIS — R748 Abnormal levels of other serum enzymes: Secondary | ICD-10-CM

## 2018-01-04 LAB — CBC
HCT: 30.6 % — ABNORMAL LOW (ref 39.0–52.0)
HEMOGLOBIN: 9.3 g/dL — AB (ref 13.0–17.0)
MCH: 25.8 pg — AB (ref 26.0–34.0)
MCHC: 30.4 g/dL (ref 30.0–36.0)
MCV: 84.8 fL (ref 80.0–100.0)
PLATELETS: 253 10*3/uL (ref 150–400)
RBC: 3.61 MIL/uL — ABNORMAL LOW (ref 4.22–5.81)
RDW: 13.5 % (ref 11.5–15.5)
WBC: 6.5 10*3/uL (ref 4.0–10.5)
nRBC: 0 % (ref 0.0–0.2)

## 2018-01-04 LAB — BLOOD CULTURE ID PANEL (REFLEXED)
Acinetobacter baumannii: NOT DETECTED
CANDIDA ALBICANS: NOT DETECTED
CANDIDA TROPICALIS: NOT DETECTED
Candida glabrata: NOT DETECTED
Candida krusei: NOT DETECTED
Candida parapsilosis: NOT DETECTED
ENTEROBACTERIACEAE SPECIES: NOT DETECTED
Enterobacter cloacae complex: NOT DETECTED
Enterococcus species: NOT DETECTED
Escherichia coli: NOT DETECTED
HAEMOPHILUS INFLUENZAE: NOT DETECTED
KLEBSIELLA PNEUMONIAE: NOT DETECTED
Klebsiella oxytoca: NOT DETECTED
Listeria monocytogenes: NOT DETECTED
Methicillin resistance: NOT DETECTED
NEISSERIA MENINGITIDIS: NOT DETECTED
Proteus species: NOT DETECTED
Pseudomonas aeruginosa: NOT DETECTED
STAPHYLOCOCCUS SPECIES: DETECTED — AB
STREPTOCOCCUS AGALACTIAE: NOT DETECTED
STREPTOCOCCUS PNEUMONIAE: NOT DETECTED
STREPTOCOCCUS PYOGENES: NOT DETECTED
Serratia marcescens: NOT DETECTED
Staphylococcus aureus (BCID): NOT DETECTED
Streptococcus species: NOT DETECTED

## 2018-01-04 LAB — GLUCOSE, CAPILLARY
GLUCOSE-CAPILLARY: 146 mg/dL — AB (ref 70–99)
GLUCOSE-CAPILLARY: 140 mg/dL — AB (ref 70–99)
GLUCOSE-CAPILLARY: 54 mg/dL — AB (ref 70–99)
Glucose-Capillary: 136 mg/dL — ABNORMAL HIGH (ref 70–99)
Glucose-Capillary: 76 mg/dL (ref 70–99)

## 2018-01-04 LAB — GAMMA GT: GGT: 14 U/L (ref 7–50)

## 2018-01-04 LAB — HEMOGLOBIN A1C
HEMOGLOBIN A1C: 9.6 % — AB (ref 4.8–5.6)
Mean Plasma Glucose: 228.82 mg/dL

## 2018-01-04 LAB — IRON AND TIBC
IRON: 9 ug/dL — AB (ref 45–182)
Saturation Ratios: 4 % — ABNORMAL LOW (ref 17.9–39.5)
TIBC: 228 ug/dL — AB (ref 250–450)
UIBC: 219 ug/dL

## 2018-01-04 LAB — FERRITIN: Ferritin: 329 ng/mL (ref 24–336)

## 2018-01-04 MED ORDER — INSULIN DETEMIR 100 UNIT/ML ~~LOC~~ SOLN
30.0000 [IU] | Freq: Two times a day (BID) | SUBCUTANEOUS | Status: DC
  Administered 2018-01-04: 30 [IU] via SUBCUTANEOUS
  Filled 2018-01-04 (×6): qty 0.3

## 2018-01-04 NOTE — Progress Notes (Signed)
PROGRESS NOTE    Anthony Dunlap  LEX:[id number]  DOB: Feb 06, 1934  DOA: 01/03/2018 PCP: [name], MD   Brief Admission Hx: 84 y.o. male with medical history significant of chronic indwelling Foley catheter secondary to history of prostate cancer, hypothyroidism, hyperlipidemia presenting to the hospital for evaluation of fever.  Patient reports having chills this evening and states he feels fatigued.  Denies having any fevers.  Reports having decreased p.o. intake.  Denies having any abdominal pain, nausea, vomiting, or diarrhea.  He was having rigors.  He was admitted for sepsis with UTI.    MDM/Assessment & Plan:   1. Sepsis - secondary to UTI - Pt clinically is much improved with supportive care and IV antibiotics.  Because he was having rigors, will be sure to monitor blood cultures as he may be bacteremic.  No growth to date.  Continue current treatments.   2. Anemia of chronic disease - Follow Hg. Currently stable from admission at 9.3.   3. UTI - secondary to chronic indwelling foley which will be replaced.  Follow urine culture and sensitivities.  4. Type 2 DM with hyperglycemia - improved with hydration and resumed basal insulin, sliding scale coverage.  A1c pending.  Not in DKA.  5. Hypothyroidism - resume home levothyroxine.  6. Hyperlipidemia - resume home pravastatin.   DVT prophylaxis: SCDs Code Status: Full  Family Communication: patient counseled at bedside Disposition Plan: return home when medically stabilized   Consultants:  n/a  Procedures:  n/a  Antimicrobials:  Ceftriaxone 11/15   Subjective: Pt says he is not having cold chills any longer.    Objective: Vitals:   01/03/18 2300 01/03/18 2353 01/03/18 2354 01/04/18 0656  BP: 113/67 115/68  (!) 165/85  Pulse: 83 96  94  Resp: 18 20  16   Temp:  97.6 F (36.4 C)  98.5 F (36.9 C)  TempSrc:  Oral  Oral  SpO2:  (!) 88%  100%  Weight:   70.3 kg   Height:   5\' 9"  (1.753 m)      Intake/Output Summary (Last 24 hours) at 01/04/2018 1051 Last data filed at 01/04/2018 0900 Gross per 24 hour  Intake 501.66 ml  Output 2050 ml  Net -1548.34 ml   Filed Weights   01/03/18 1915 01/03/18 2354  Weight: 74.8 kg 70.3 kg   REVIEW OF SYSTEMS  As per history otherwise all reviewed and reported negative  Exam:  General exam: elderly male, awake,alert, NAD, cooperative.  Respiratory system: Clear. No increased work of breathing. Cardiovascular system: S1 & S2 heard. No JVD, murmurs, gallops, clicks or pedal edema. Gastrointestinal system: Abdomen is nondistended, soft and nontender. Normal bowel sounds heard. GU: foley with clear yellow urine seen.  Central nervous system: Alert and oriented. No focal neurological deficits. Extremities: no CCE.  Data Reviewed: Basic Metabolic Panel: Recent Labs  Lab 01/03/18 1929  NA 131*  K 3.9  CL 98  CO2 21*  GLUCOSE 359*  BUN 23  CREATININE 1.07  CALCIUM 8.4*   Liver Function Tests: Recent Labs  Lab 01/03/18 1929  AST 26  ALT 19  ALKPHOS 132*  BILITOT 1.0  PROT 7.8  ALBUMIN 3.1*   No results for input(s): LIPASE, AMYLASE in the last 168 hours. No results for input(s): AMMONIA in the last 168 hours. CBC: Recent Labs  Lab 01/03/18 1929 01/04/18 0757  WBC 8.9 6.5  NEUTROABS 8.0*  --   HGB 9.2* 9.3*  HCT 30.9* 30.6*  MCV 83.3  84.8  PLT 283 253   Cardiac Enzymes: No results for input(s): CKTOTAL, CKMB, CKMBINDEX, TROPONINI in the last 168 hours. CBG (last 3)  Recent Labs    01/04/18 0827  GLUCAP 146*   Recent Results (from the past 240 hour(s))  Culture, blood (Routine x 2)     Status: None (Preliminary result)   Collection Time: 01/03/18  7:30 PM  Result Value Ref Range Status   Specimen Description RIGHT ANTECUBITAL  Final   Special Requests   Final    BOTTLES DRAWN AEROBIC AND ANAEROBIC Blood Culture adequate volume   Culture   Final    NO GROWTH < 12 HOURS Performed at [facility], [address], [place], [place] [address]    Report Status PENDING  Incomplete  Culture, blood (Routine x 2)     Status: None (Preliminary result)   Collection Time: 01/03/18  7:30 PM  Result Value Ref Range Status   Specimen Description LEFT ANTECUBITAL  Final   Special Requests   Final    BOTTLES DRAWN AEROBIC AND ANAEROBIC Blood Culture adequate volume   Culture   Final    NO GROWTH < 12 HOURS Performed at [facility], [address], [place], [place] [address]    Report Status PENDING  Incomplete     Studies: Dg Chest 2 View  Result Date: 01/03/2018 CLINICAL DATA:  Sepsis, fever, no pain EXAM: CHEST - 2 VIEW COMPARISON:  09/26/2007 FINDINGS: There is no focal parenchymal opacity. There is no pleural effusion or pneumothorax. The heart and mediastinal contours are unremarkable. There is mild osteoarthritis of bilateral glenohumeral joints, left greater than right. IMPRESSION: No active cardiopulmonary disease. Electronically Signed   By: Kathreen Devoid   On: 01/03/2018 20:23     Scheduled Meds: . insulin aspart  0-9 Units Subcutaneous TID WC  . insulin detemir  30 Units Subcutaneous BID  . levothyroxine  25 mcg Oral QAC breakfast  . pravastatin  10 mg Oral Daily   Continuous Infusions: . sodium chloride 125 mL/hr at 01/04/18 0050  . cefTRIAXone (ROCEPHIN)  IV      Principal Problem:   Sepsis ([place]) Active Problems:   Type 2 diabetes mellitus ([name])   Prostate cancer ([name])   HLD (hyperlipidemia)   UTI (urinary tract infection)   Chronic anemia   Chronic indwelling Foley catheter   Elevated alkaline phosphatase level   Hypothyroidism   Physical deconditioning   Time spent:   Irwin Brakeman, MD, FAAFP [facility] Pager [phone]  If 7PM-7AM, please contact night-coverage [email or web address] Password [email or web address] 01/04/2018, 10:51 AM    LOS: 1 day

## 2018-01-04 NOTE — Progress Notes (Signed)
Hypoglycemic Event  CBG: 54 Treatment: 15 GM carbohydrate snack  Symptoms: None  Follow-up CBG: Time:2245 CBG Result:76  Possible Reasons for Event: Inadequate meal intake  Comments/MD notified: midlevel-[place], to hold Levemir tonight    Marcello Moores, Jeralene Peters

## 2018-01-05 DIAGNOSIS — Z960 Presence of urogenital implants: Secondary | ICD-10-CM

## 2018-01-05 DIAGNOSIS — Z794 Long term (current) use of insulin: Secondary | ICD-10-CM

## 2018-01-05 DIAGNOSIS — E1165 Type 2 diabetes mellitus with hyperglycemia: Secondary | ICD-10-CM

## 2018-01-05 DIAGNOSIS — C61 Malignant neoplasm of prostate: Secondary | ICD-10-CM

## 2018-01-05 DIAGNOSIS — A419 Sepsis, unspecified organism: Secondary | ICD-10-CM

## 2018-01-05 DIAGNOSIS — R319 Hematuria, unspecified: Secondary | ICD-10-CM

## 2018-01-05 DIAGNOSIS — E785 Hyperlipidemia, unspecified: Secondary | ICD-10-CM

## 2018-01-05 DIAGNOSIS — N390 Urinary tract infection, site not specified: Secondary | ICD-10-CM

## 2018-01-05 LAB — GLUCOSE, CAPILLARY
GLUCOSE-CAPILLARY: 43 mg/dL — AB (ref 70–99)
Glucose-Capillary: 83 mg/dL (ref 70–99)
Glucose-Capillary: 154 mg/dL — ABNORMAL HIGH (ref 70–99)
Glucose-Capillary: 234 mg/dL — ABNORMAL HIGH (ref 70–99)
Glucose-Capillary: 246 mg/dL — ABNORMAL HIGH (ref 70–99)

## 2018-01-05 LAB — CBC WITH DIFFERENTIAL/PLATELET
ABS IMMATURE GRANULOCYTES: 0.03 10*3/uL (ref 0.00–0.07)
Basophils Absolute: 0 10*3/uL (ref 0.0–0.1)
Basophils Relative: 0 %
EOS PCT: 0 %
Eosinophils Absolute: 0 10*3/uL (ref 0.0–0.5)
HCT: 30.3 % — ABNORMAL LOW (ref 39.0–52.0)
Hemoglobin: 9.1 g/dL — ABNORMAL LOW (ref 13.0–17.0)
Immature Granulocytes: 0 %
LYMPHS PCT: 25 %
Lymphs Abs: 2.2 10*3/uL (ref 0.7–4.0)
MCH: 24.7 pg — AB (ref 26.0–34.0)
MCHC: 30 g/dL (ref 30.0–36.0)
MCV: 82.1 fL (ref 80.0–100.0)
MONO ABS: 0.9 10*3/uL (ref 0.1–1.0)
MONOS PCT: 10 %
Neutro Abs: 5.9 10*3/uL (ref 1.7–7.7)
Neutrophils Relative %: 65 %
Platelets: 328 10*3/uL (ref 150–400)
RBC: 3.69 MIL/uL — AB (ref 4.22–5.81)
RDW: 13.5 % (ref 11.5–15.5)
WBC: 9 10*3/uL (ref 4.0–10.5)
nRBC: 0 % (ref 0.0–0.2)

## 2018-01-05 LAB — COMPREHENSIVE METABOLIC PANEL
ALK PHOS: 141 U/L — AB (ref 38–126)
ALT: 19 U/L (ref 0–44)
ANION GAP: 8 (ref 5–15)
AST: 21 U/L (ref 15–41)
Albumin: 2.6 g/dL — ABNORMAL LOW (ref 3.5–5.0)
BILIRUBIN TOTAL: 0.3 mg/dL (ref 0.3–1.2)
BUN: 13 mg/dL (ref 8–23)
CALCIUM: 8.3 mg/dL — AB (ref 8.9–10.3)
CO2: 23 mmol/L (ref 22–32)
Chloride: 104 mmol/L (ref 98–111)
Creatinine, Ser: 0.74 mg/dL (ref 0.61–1.24)
Glucose, Bld: 57 mg/dL — ABNORMAL LOW (ref 70–99)
Potassium: 3.2 mmol/L — ABNORMAL LOW (ref 3.5–5.1)
SODIUM: 135 mmol/L (ref 135–145)
TOTAL PROTEIN: 7.1 g/dL (ref 6.5–8.1)

## 2018-01-05 LAB — URINE CULTURE

## 2018-01-05 MED ORDER — INSULIN DETEMIR 100 UNIT/ML ~~LOC~~ SOLN
25.0000 [IU] | Freq: Two times a day (BID) | SUBCUTANEOUS | Status: DC
  Administered 2018-01-05 – 2018-01-06 (×2): 25 [IU] via SUBCUTANEOUS
  Filled 2018-01-05 (×4): qty 0.25

## 2018-01-05 MED ORDER — POTASSIUM CHLORIDE CRYS ER 20 MEQ PO TBCR
40.0000 meq | EXTENDED_RELEASE_TABLET | Freq: Once | ORAL | Status: AC
  Administered 2018-01-05: 40 meq via ORAL
  Filled 2018-01-05: qty 2

## 2018-01-05 NOTE — Progress Notes (Signed)
Hypoglycemic Event  CBG: 43  Treatment: 15 GM carbohydrate snack  Symptoms: Hungry  Follow-up CBG: [id number] CBG Result:83  Possible Reasons for Event: Inadequate meal intake  Comments/MD notified:[place], MD. Continue to monitor.     [place]

## 2018-01-05 NOTE — Progress Notes (Signed)
PROGRESS NOTE    Anthony Dunlap  IWO:[id number]  DOB: June 04, 1933  DOA: 01/03/2018 PCP: [name], MD   Brief Admission Hx: 84 y.o. male with medical history significant of chronic indwelling Foley catheter secondary to history of prostate cancer, hypothyroidism, hyperlipidemia presenting to the hospital for evaluation of fever.  Patient reports having chills this evening and states he feels fatigued.  Denies having any fevers.  Reports having decreased p.o. intake.  Denies having any abdominal pain, nausea, vomiting, or diarrhea.  He was having rigors.  He was admitted for sepsis with UTI.    MDM/Assessment & Plan:   1. Sepsis - secondary to UTI - Pt clinically is much improved with supportive care and IV antibiotics.  Blood cultures were 1 out of 2 positive for coagulase-negative staph, likely contaminant. Continue current treatments.   2. Anemia of chronic disease - Follow Hg. Currently stable from admission at 9.1.   3. UTI - secondary to chronic indwelling foley.  Patient reports that catheter was recently replaced on 11/12.  Follow urine culture and sensitivities.  4. Type 2 DM with hyperglycemia - improved with hydration and resumed basal insulin, sliding scale coverage.  A1c 9.6, indicating poor long-term control.  Not in DKA.  Patient did have an episode of hypoglycemia this morning.  This improved with glucose supplementation.  Overall Levemir dose has been decreased 5. Hypothyroidism - resumed on home levothyroxine.  6. Hyperlipidemia - resumed on home pravastatin.  7. Hypokalemia.  Replace  DVT prophylaxis: SCDs Code Status: Full  Family Communication: patient counseled at bedside Disposition Plan: return home when medically stabilized   Consultants:  n/a  Procedures:  n/a  Antimicrobials:  Ceftriaxone 11/15>   Subjective: No new complaints.  No cough or shortness of breath.  No diarrhea.  Objective: Vitals:   01/04/18 1419 01/04/18 1446 01/05/18 0649  01/05/18 1343  BP:  139/80 (!) 148/72 (!) 152/88  Pulse:  99 94 91  Resp:  16 18 19   Temp:  98.4 F (36.9 C) 98.5 F (36.9 C) 98.4 F (36.9 C)  TempSrc:  Oral Oral Oral  SpO2: 93% 100% 98% 97%  Weight:      Height:        Intake/Output Summary (Last 24 hours) at 01/05/2018 1831 Last data filed at 01/05/2018 1621 Gross per 24 hour  Intake 1580 ml  Output 2850 ml  Net -1270 ml   Filed Weights   01/03/18 1915 01/03/18 2354  Weight: 74.8 kg 70.3 kg   REVIEW OF SYSTEMS  As per history otherwise all reviewed and reported negative  Exam:  General exam: Alert, awake, oriented x 3 Respiratory system: Clear to auscultation. Respiratory effort normal. Cardiovascular system:RRR. No murmurs, rubs, gallops. Gastrointestinal system: Abdomen is nondistended, soft and nontender. No organomegaly or masses felt. Normal bowel sounds heard. Central nervous system: Alert and oriented. No focal neurological deficits. Extremities: No C/C/E, +pedal pulses Skin: No rashes, lesions or ulcers Psychiatry: Judgement and insight appear normal. Mood & affect appropriate.    Data Reviewed: Basic Metabolic Panel: Recent Labs  Lab 01/03/18 1929 01/05/18 0551  NA 131* 135  K 3.9 3.2*  CL 98 104  CO2 21* 23  GLUCOSE 359* 57*  BUN 23 13  CREATININE 1.07 0.74  CALCIUM 8.4* 8.3*   Liver Function Tests: Recent Labs  Lab 01/03/18 1929 01/05/18 0551  AST 26 21  ALT 19 19  ALKPHOS 132* 141*  BILITOT 1.0 0.3  PROT 7.8 7.1  ALBUMIN 3.1*  2.6*   No results for input(s): LIPASE, AMYLASE in the last 168 hours. No results for input(s): AMMONIA in the last 168 hours. CBC: Recent Labs  Lab 01/03/18 1929 01/04/18 0757 01/05/18 0551  WBC 8.9 6.5 9.0  NEUTROABS 8.0*  --  5.9  HGB 9.2* 9.3* 9.1*  HCT 30.9* 30.6* 30.3*  MCV 83.3 84.8 82.1  PLT 283 253 328   Cardiac Enzymes: No results for input(s): CKTOTAL, CKMB, CKMBINDEX, TROPONINI in the last 168 hours. CBG (last 3)  Recent Labs     01/05/18 0828 01/05/18 1103 01/05/18 1612  GLUCAP 83 154* 234*   Recent Results (from the past 240 hour(s))  Culture, blood (Routine x 2)     Status: None (Preliminary result)   Collection Time: 01/03/18  7:30 PM  Result Value Ref Range Status   Specimen Description RIGHT ANTECUBITAL  Final   Special Requests   Final    BOTTLES DRAWN AEROBIC AND ANAEROBIC Blood Culture adequate volume   Culture   Final    NO GROWTH 2 DAYS Performed at [facility] [place], [address], [place], [place] [address]    Report Status PENDING  Incomplete  Culture, blood (Routine x 2)     Status: Abnormal (Preliminary result)   Collection Time: 01/03/18  7:30 PM  Result Value Ref Range Status   Specimen Description   Final    LEFT ANTECUBITAL Performed at [facility], [address], [place], [place] [address]    Special Requests   Final    BOTTLES DRAWN AEROBIC AND ANAEROBIC Blood Culture adequate volume Performed at [facility], [address], [place], [place] [address]    Culture  Setup Time   Final    GRAM POSITIVE COCCI Gram Stain Report Called to,Read Back By and Verified With: MAYS,J. AT 7169 ON 01/04/2018 BY EVA ANAEROBIC BOTTLE ONLY Performed at [place], READ BACK BY AND VERIFIED WITH: Marijo Conception RN [id number] BY GF    Culture (A)  Final    STAPHYLOCOCCUS SPECIES (COAGULASE NEGATIVE) THE SIGNIFICANCE OF ISOLATING THIS ORGANISM FROM A SINGLE SET OF BLOOD CULTURES WHEN MULTIPLE SETS ARE DRAWN IS UNCERTAIN. PLEASE NOTIFY THE MICROBIOLOGY DEPARTMENT WITHIN ONE WEEK IF SPECIATION AND SENSITIVITIES ARE REQUIRED. Performed at [facility], [place] [address] [place], [place] [address]    Report Status PENDING  Incomplete  Blood Culture ID Panel (Reflexed)     Status: Abnormal   Collection Time: 01/03/18  7:30 PM  Result Value Ref Range Status   Enterococcus species NOT DETECTED NOT DETECTED Final   Listeria monocytogenes  NOT DETECTED NOT DETECTED Final   Staphylococcus species DETECTED (A) NOT DETECTED Final    Comment: Methicillin (oxacillin) susceptible coagulase negative staphylococcus. Possible blood culture contaminant (unless isolated from more than one blood culture draw or clinical case suggests pathogenicity). No antibiotic treatment is indicated for blood  culture contaminants. CRITICAL RESULT CALLED TO, READ BACK BY AND VERIFIED WITHMarijo Conception RN [phone] BY GF    Staphylococcus aureus (BCID) NOT DETECTED NOT DETECTED Final   Methicillin resistance NOT DETECTED NOT DETECTED Final   Streptococcus species NOT DETECTED NOT DETECTED Final   Streptococcus agalactiae NOT DETECTED NOT DETECTED Final   Streptococcus pneumoniae NOT DETECTED NOT DETECTED Final   Streptococcus pyogenes NOT DETECTED NOT DETECTED Final   Acinetobacter baumannii NOT DETECTED NOT DETECTED Final   Enterobacteriaceae species NOT DETECTED NOT DETECTED Final  Enterobacter cloacae complex NOT DETECTED NOT DETECTED Final   Escherichia coli NOT DETECTED NOT DETECTED Final   Klebsiella oxytoca NOT DETECTED NOT DETECTED Final   Klebsiella pneumoniae NOT DETECTED NOT DETECTED Final   Proteus species NOT DETECTED NOT DETECTED Final   Serratia marcescens NOT DETECTED NOT DETECTED Final   Haemophilus influenzae NOT DETECTED NOT DETECTED Final   Neisseria meningitidis NOT DETECTED NOT DETECTED Final   Pseudomonas aeruginosa NOT DETECTED NOT DETECTED Final   Candida albicans NOT DETECTED NOT DETECTED Final   Candida glabrata NOT DETECTED NOT DETECTED Final   Candida krusei NOT DETECTED NOT DETECTED Final   Candida parapsilosis NOT DETECTED NOT DETECTED Final   Candida tropicalis NOT DETECTED NOT DETECTED Final    Comment: Performed at [facility], [place] [address], [place], [place] [address]  Urine Culture     Status: None   Collection Time: 01/03/18  8:40 PM  Result Value Ref Range Status   Specimen Description   Final     URINE, CLEAN CATCH Performed at [facility], [address], [place], [place] [address]    Special Requests   Final    NONE Performed at [facility], [address], [place],  [address]    Culture   Final    Multiple bacterial morphotypes present, none predominant. Suggest appropriate recollection if clinically indicated.   Report Status 01/05/2018 FINAL  Final     Studies: Dg Chest 2 View  Result Date: 01/03/2018 CLINICAL DATA:  Sepsis, fever, no pain EXAM: CHEST - 2 VIEW COMPARISON:  09/26/2007 FINDINGS: There is no focal parenchymal opacity. There is no pleural effusion or pneumothorax. The heart and mediastinal contours are unremarkable. There is mild osteoarthritis of bilateral glenohumeral joints, left greater than right. IMPRESSION: No active cardiopulmonary disease. Electronically Signed   By: Kathreen Devoid   On: 01/03/2018 20:23     Scheduled Meds: . insulin aspart  0-9 Units Subcutaneous TID WC  . insulin detemir  25 Units Subcutaneous BID  . levothyroxine  25 mcg Oral QAC breakfast  . pravastatin  10 mg Oral Daily   Continuous Infusions: . cefTRIAXone (ROCEPHIN)  IV 1 g (01/04/18 2029)    Principal Problem:   Sepsis ([place]) Active Problems:   Type 2 diabetes mellitus ([name])   Prostate cancer ([name])   HLD (hyperlipidemia)   UTI (urinary tract infection)   Chronic anemia   Chronic indwelling Foley catheter   Elevated alkaline phosphatase level   Hypothyroidism   Physical deconditioning   Time spent: 2mins  Kathie Dike, MD [facility] Pager [phone]  If 7PM-7AM, please contact night-coverage [email or web address] Password [email or web address] 01/05/2018, 6:31 PM    LOS: 2 days

## 2018-01-06 LAB — BASIC METABOLIC PANEL
Anion gap: 10 (ref 5–15)
BUN: 13 mg/dL (ref 8–23)
CHLORIDE: 102 mmol/L (ref 98–111)
CO2: 24 mmol/L (ref 22–32)
Calcium: 8.5 mg/dL — ABNORMAL LOW (ref 8.9–10.3)
Creatinine, Ser: 0.81 mg/dL (ref 0.61–1.24)
GFR calc Af Amer: >60 mL/min (ref >60)
GFR calc non Af Amer: >60 mL/min (ref >60)
Glucose, Bld: 65 mg/dL — ABNORMAL LOW (ref 70–99)
POTASSIUM: 3.9 mmol/L (ref 3.5–5.1)
SODIUM: 136 mmol/L (ref 135–145)

## 2018-01-06 LAB — GLUCOSE, CAPILLARY
GLUCOSE-CAPILLARY: 40 mg/dL — AB (ref 70–99)
GLUCOSE-CAPILLARY: 250 mg/dL — AB (ref 70–99)
Glucose-Capillary: 93 mg/dL (ref 70–99)
Glucose-Capillary: 224 mg/dL — ABNORMAL HIGH (ref 70–99)
Glucose-Capillary: 180 mg/dL — ABNORMAL HIGH (ref 70–99)

## 2018-01-06 MED ORDER — INSULIN DETEMIR 100 UNIT/ML ~~LOC~~ SOLN: 25.0000 [IU] | Freq: Every day | SUBCUTANEOUS | Status: DC

## 2018-01-06 MED ORDER — INSULIN ASPART 100 UNIT/ML ~~LOC~~ SOLN
3.0000 [IU] | Freq: Three times a day (TID) | SUBCUTANEOUS | Status: DC
  Administered 2018-01-07 (×2): 3 [IU] via SUBCUTANEOUS

## 2018-01-06 MED ORDER — INSULIN DETEMIR 100 UNIT/ML ~~LOC~~ SOLN
30.0000 [IU] | Freq: Every day | SUBCUTANEOUS | Status: DC
  Administered 2018-01-07: 30 [IU] via SUBCUTANEOUS
  Filled 2018-01-06 (×2): qty 0.3

## 2018-01-06 NOTE — Progress Notes (Signed)
Inpatient Diabetes Program Recommendations  AACE/ADA: New Consensus Statement on Inpatient Glycemic Control (2015)  Target Ranges:  Prepandial:   less than 140 mg/dL      Peak postprandial:   less than 180 mg/dL (1-2 hours)      Critically ill patients:  140 - 180 mg/dL   Results for Anthony Dunlap, Anthony Dunlap (MRN [id number]) as of 01/06/2018 08:08  Ref. Range 01/05/2018 07:22 01/05/2018 08:28 01/05/2018 11:03 01/05/2018 16:12 01/05/2018 21:32 01/06/2018 07:39  Glucose-Capillary Latest Ref Range: 70 - 99 mg/dL 43 (LL) 83 154 (H)  Novolog 2 units  Levemir 30 units 234 (H)  Novolog 3 units 246 (H)     Levemir 25 units 40 (LL)   Results for Anthony Dunlap, Anthony Dunlap (MRN [id number]) as of 01/06/2018 08:08  Ref. Range 01/04/2018 08:27 01/04/2018 11:30 01/04/2018 11:23 01/04/2018 16:38 01/04/2018 21:36 01/04/2018 22:37  Glucose-Capillary Latest Ref Range: 70 - 99 mg/dL 146 (H)  Novolog 1 unit   Levemir 30 units @ 11:30 136 (H)  Novolog 1 unit 140 (H) 54 (L) 76   Results for Anthony Dunlap, Anthony Dunlap (MRN [id number]) as of 01/06/2018 08:08  Ref. Range 01/04/2018 06:23  Hemoglobin A1C Latest Ref Range: 4.8 - 5.6 % 9.6 (H)   Review of Glycemic Control  Diabetes history: DM2 Outpatient Diabetes medications: Levemir 38-39 units BID Current orders for Inpatient glycemic control: Levemir 25 units BID, Novolog 0-9 units TID with meals  Inpatient Diabetes Program Recommendations:  Insulin - Basal: Please consider decreasing Levemir to 25 units QHS. Insulin - Meal Coverage: Please consider ordering Novolog 3 units TID with meals for meal coverage if patient eats at least 50% of meals. HgbA1C: A1C 9.6% on 01/04/18 indicating an average glucose of 229 mg/dl over the past 2-3 months.  NOTE: In reviewing chart, noted patient only received Levemir 30 units 1 time on 01/04/18 and fasting glucose was 43 mg/dl on 01/05/18. Patient received Leveimr 30 units at 11:20 and Levemir 25 units at 21:52 on 01/05/18 and fasting  glucose 40 mg/dl this morning.  Recommend decreasing basal insulin and adding Novolog meal coverage if patient is eating at least 50% of meals.  Thanks, [name], RN, MSN, CDE Diabetes Coordinator Inpatient Diabetes Program [phone] (Team Pager from 8am to 5pm)

## 2018-01-06 NOTE — Progress Notes (Signed)
Hypoglycemic Event  CBG: 40  Treatment: 15 GM carbohydrate snack  Symptoms: None  Follow-up CBG: Time: 0823 CBG Result: 93  Possible Reasons for Event: Inadequate meal intake  Comments/MD notified: Will continue to monitor.    Anthony Dunlap

## 2018-01-06 NOTE — Care Management Note (Signed)
Case Management Note  Patient Details  Name: Anthony Dunlap MRN: [id number] Date of Birth: 1933-02-26  Subjective/Objective:   Admitted with sepsis. Pt from home, ind with ADL's. Pt has insurance and PCP. Pt very active. PT recommends HH PT. Pt declines [facility] services at this time.                 Action/Plan: DC ome with self care. No CM needs noted at this time.   Expected Discharge Date:       12/07/17           Expected Discharge Plan:  Home/Self Care  In-House Referral:  NA  Discharge planning Services  CM Consult  Post Acute Care Choice:  NA Choice offered to:  NA  HH Arranged:  Patient Refused [place]:     Status of Service:  Completed, signed off  If discussed at [place] of Stay Meetings, dates discussed:    Additional Comments:  Sherald Barge, RN 01/06/2018, 1:07 PM

## 2018-01-06 NOTE — Evaluation (Signed)
Physical Therapy Evaluation Patient Details Name: Anthony Dunlap MRN: [id number] DOB: 1934/01/16 Today's Date: 01/06/2018   History of Present Illness  Anthony Dunlap is a 84 y.o. male with medical history significant of chronic indwelling Foley catheter secondary to history of prostate cancer, hypothyroidism, hyperlipidemia presenting to the hospital for evaluation of fever.  Patient reports having chills this evening and states he feels fatigued.  Denies having any fevers.  Reports having decreased p.o. intake.  Denies having any abdominal pain, nausea, vomiting, or diarrhea.  Denies having any cough or shortness of breath.  Denies having any hematemesis, hematochezia, or melena.  States his urine looks dark sometimes.  States he had a Foley catheter initially placed over 6 months ago since his bladder was not emptying.  He is followed by urology and gets monthly Foley catheter changes.    Clinical Impression  Patient functioning near baseline for functional mobility and gait, had to frequently lean on nearby objects and use side rails in hallway initially during gait training, after 3-4 minutes demonstrated improvement in balance requiring less support and ambulated back to room without loss of balance.  Patient tolerated sitting up in chair after therapy.  Patient will benefit from continued physical therapy in hospital and recommended venue below to increase strength, balance, endurance for safe ADLs and gait.    Follow Up Recommendations Home health PT;Supervision - Intermittent    Equipment Recommendations  None recommended by PT    Recommendations for Other Services       Precautions / Restrictions Precautions Precautions: Fall Restrictions Weight Bearing Restrictions: No      Mobility  Bed Mobility Overal bed mobility: Modified Independent             General bed mobility comments: increased time  Transfers Overall transfer level: Needs assistance Equipment used:  None Transfers: Sit to/from Stand;Stand Pivot Transfers Sit to Stand: Min guard Stand pivot transfers: Min guard       General transfer comment: labored unsteady movement  Ambulation/Gait Ambulation/Gait assistance: Min guard Gait Distance (Feet): 100 Feet Assistive device: 1 person hand held assist;None Gait Pattern/deviations: Decreased step length - left;Decreased step length - right;Decreased stride length;Antalgic Gait velocity: decreased   General Gait Details: slightly labored cadence with antalgic gait on LLE due to chronic left knee pain, frequent use of side rails in hallway or leaning on nearby objects for support during intial gait, but improved after 30-40 feet  Stairs            Wheelchair Mobility    Modified Rankin (Stroke Patients Only)       Balance Overall balance assessment: Mild deficits observed, not formally tested                                           Pertinent Vitals/Pain Pain Assessment: No/denies pain    Home Living Family/patient expects to be discharged to:: Private residence Living Arrangements: Spouse/significant other Available Help at Discharge: Family Type of Home: House Home Access: Ramped entrance     Home Layout: One level Home Equipment: Environmental consultant - 2 wheels;Cane - single point;Bedside commode;Shower seat      Prior Function Level of Independence: Independent         Comments: community ambulator, drives, has to use permanent indwelling foley catheter at home     Hand Dominance  Extremity/Trunk Assessment   Upper Extremity Assessment Upper Extremity Assessment: Generalized weakness    Lower Extremity Assessment Lower Extremity Assessment: Generalized weakness    Cervical / Trunk Assessment Cervical / Trunk Assessment: Normal  Communication   Communication: No difficulties  Cognition Arousal/Alertness: Awake/alert Behavior During Therapy: WFL for tasks  assessed/performed Overall Cognitive Status: Within Functional Limits for tasks assessed                                        General Comments      Exercises     Assessment/Plan    PT Assessment Patient needs continued PT services  PT Problem List Decreased strength;Decreased activity tolerance;Decreased balance;Decreased mobility       PT Treatment Interventions Gait training;Stair training;Functional mobility training;Therapeutic activities;Therapeutic exercise;Patient/family education    PT Goals (Current goals can be found in the Care Plan section)  Acute Rehab PT Goals Patient Stated Goal: return home with family to assist PT Goal Formulation: With patient Time For Goal Achievement: 01/13/18 Potential to Achieve Goals: Good    Frequency Min 3X/week   Barriers to discharge        Co-evaluation               AM-PAC PT "6 Clicks" Daily Activity  Outcome Measure Difficulty turning over in bed (including adjusting bedclothes, sheets and blankets)?: None Difficulty moving from lying on back to sitting on the side of the bed? : None Difficulty sitting down on and standing up from a chair with arms (e.g., wheelchair, bedside commode, etc,.)?: A Little Help needed moving to and from a bed to chair (including a wheelchair)?: A Little Help needed walking in hospital room?: A Little Help needed climbing 3-5 steps with a railing? : A Little 6 Click Score: 20    End of Session Equipment Utilized During Treatment: Gait belt Activity Tolerance: Patient tolerated treatment well;Patient limited by fatigue Patient left: in chair;with call bell/phone within reach Nurse Communication: Mobility status PT Visit Diagnosis: Unsteadiness on feet (R26.81);Other abnormalities of gait and mobility (R26.89);Muscle weakness (generalized) (M62.81)    Time: 4010-2725 PT Time Calculation (min) (ACUTE ONLY): 23 min   Charges:   PT Evaluation $PT Eval Moderate  Complexity: 1 Mod PT Treatments $Therapeutic Activity: 23-37 mins        11:57 AM, 01/06/18 Anthony Dunlap, MPT Physical Therapist with [facility] 336 [phone] office [phone] mobile phone

## 2018-01-06 NOTE — Plan of Care (Signed)
  Problem: Acute Rehab PT Goals(only PT should resolve) Goal: Pt Will Go Supine/Side To Sit Outcome: Progressing Flowsheets (Taken 01/06/2018 1159) Pt will go Supine/Side to Sit: Independently Goal: Patient Will Transfer Sit To/From Stand Outcome: Progressing Flowsheets (Taken 01/06/2018 1159) Patient will transfer sit to/from stand: with supervision Goal: Pt Will Transfer Bed To Chair/Chair To Bed Outcome: Progressing Flowsheets (Taken 01/06/2018 1159) Pt will Transfer Bed to Chair/Chair to Bed: with supervision Goal: Pt Will Ambulate Outcome: Progressing Flowsheets (Taken 01/06/2018 1159) Pt will Ambulate: > 125 feet; with modified independence; with cane Note:  Or without using AD   11:59 AM, 01/06/18 Lonell Grandchild, MPT Physical Therapist with [facility] 336 [phone] office [phone] mobile phone

## 2018-01-06 NOTE — Progress Notes (Signed)
PROGRESS NOTE    Anthony Dunlap  JQB:[id number]  DOB: 1933/06/27  DOA: 01/03/2018 PCP: [name], MD   Brief Admission Hx: 84 y.o. male with medical history significant of chronic indwelling Foley catheter secondary to history of prostate cancer, hypothyroidism, hyperlipidemia presenting to the hospital for evaluation of fever.  Patient reports having chills this evening and states he feels fatigued.  Denies having any fevers.  Reports having decreased p.o. intake.  Denies having any abdominal pain, nausea, vomiting, or diarrhea.  He was having rigors.  He was admitted for sepsis with UTI.    MDM/Assessment & Plan:   1. Sepsis - secondary to UTI - Pt clinically is much improved with supportive care and IV antibiotics.  Blood cultures were 1 out of 2 positive for coagulase-negative staph, likely contaminant. Continue current treatments.   2. Anemia of chronic disease - Follow Hg. Currently stable from admission at 9.1.   3. UTI - secondary to chronic indwelling foley.  Patient reports that catheter was recently replaced on 11/12.  Urine culture does not show any specific growth.  He is not having any further fevers.  Will discontinue further antibiotics. 4. Type 2 DM with hypoglycemia - improved with hydration and resumed basal insulin, sliding scale coverage.  A1c 9.6, indicating poor long-term control.  Not in DKA.  Patient continues to have episodes of hypoglycemia in the morning with blood sugar in the 40s.  Levemir dose has been decreased from twice daily to once daily.  Once blood sugars are stable, can likely discharge home on reduced dose of Levemir. 5. Hypothyroidism - resumed on home levothyroxine.  6. Hyperlipidemia - resumed on home pravastatin.  7. Hypokalemia.  Replace  DVT prophylaxis: SCDs Code Status: Full  Family Communication: patient counseled at bedside Disposition Plan: return home when medically  stabilized   Consultants:  n/a  Procedures:  n/a  Antimicrobials:  Ceftriaxone 11/15> 11/18   Subjective: No shortness of breath or chest pain. No nausea or vomiting.  Objective: Vitals:   01/05/18 2130 01/06/18 0539 01/06/18 0957 01/06/18 1347  BP: (!) 157/97 (!) 160/88  (!) 157/66  Pulse: 96 (!) 105  98  Resp: 20 20  18   Temp: 98.5 F (36.9 C) (!) 97.5 F (36.4 C)  97.9 F (36.6 C)  TempSrc: Oral Oral  Oral  SpO2: 97% 100% 99% 99%  Weight:      Height:        Intake/Output Summary (Last 24 hours) at 01/06/2018 1725 Last data filed at 01/06/2018 1300 Gross per 24 hour  Intake 580 ml  Output 2050 ml  Net -1470 ml   Filed Weights   01/03/18 1915 01/03/18 2354  Weight: 74.8 kg 70.3 kg   REVIEW OF SYSTEMS  As per history otherwise all reviewed and reported negative  Exam: General exam: Alert, awake, oriented x 3 Respiratory system: Clear to auscultation. Respiratory effort normal. Cardiovascular system:RRR. No murmurs, rubs, gallops. Gastrointestinal system: Abdomen is nondistended, soft and nontender. No organomegaly or masses felt. Normal bowel sounds heard. Central nervous system: Alert and oriented. No focal neurological deficits. Extremities: No C/C/E, +pedal pulses Skin: No rashes, lesions or ulcers Psychiatry: Judgement and insight appear normal. Mood & affect appropriate.    Data Reviewed: Basic Metabolic Panel: Recent Labs  Lab 01/03/18 1929 01/05/18 0551 01/06/18 0609  NA 131* 135 136  K 3.9 3.2* 3.9  CL 98 104 102  CO2 21* 23 24  GLUCOSE 359* 57* 65*  BUN 23 13  13  CREATININE 1.07 0.74 0.81  CALCIUM 8.4* 8.3* 8.5*   Liver Function Tests: Recent Labs  Lab 01/03/18 1929 01/05/18 0551  AST 26 21  ALT 19 19  ALKPHOS 132* 141*  BILITOT 1.0 0.3  PROT 7.8 7.1  ALBUMIN 3.1* 2.6*   No results for input(s): LIPASE, AMYLASE in the last 168 hours. No results for input(s): AMMONIA in the last 168 hours. CBC: Recent Labs  Lab  01/03/18 1929 01/04/18 0757 01/05/18 0551  WBC 8.9 6.5 9.0  NEUTROABS 8.0*  --  5.9  HGB 9.2* 9.3* 9.1*  HCT 30.9* 30.6* 30.3*  MCV 83.3 84.8 82.1  PLT 283 253 328   Cardiac Enzymes: No results for input(s): CKTOTAL, CKMB, CKMBINDEX, TROPONINI in the last 168 hours. CBG (last 3)  Recent Labs    01/06/18 0823 01/06/18 1125 01/06/18 1622  GLUCAP 93 224* 180*   Recent Results (from the past 240 hour(s))  Culture, blood (Routine x 2)     Status: None (Preliminary result)   Collection Time: 01/03/18  7:30 PM  Result Value Ref Range Status   Specimen Description RIGHT ANTECUBITAL  Final   Special Requests   Final    BOTTLES DRAWN AEROBIC AND ANAEROBIC Blood Culture adequate volume   Culture   Final    NO GROWTH 3 DAYS Performed at [facility], [address] [place], [place] [address]    Report Status PENDING  Incomplete  Culture, blood (Routine x 2)     Status: Abnormal   Collection Time: 01/03/18  7:30 PM  Result Value Ref Range Status   Specimen Description   Final    LEFT ANTECUBITAL Performed at [facility], [address], [place], [place] [address]    Special Requests   Final    BOTTLES DRAWN AEROBIC AND ANAEROBIC Blood Culture adequate volume Performed at [facility], [address] [place], [place] [address]    Culture  Setup Time   Final    GRAM POSITIVE COCCI Gram Stain Report Called to,Read Back By and Verified With: MAYS,J. AT 1606 ON 01/04/2018 BY EVA ANAEROBIC BOTTLE ONLY Performed at [place], READ BACK BY AND VERIFIED WITH: Marijo Conception RN [id number] BY GF    Culture (A)  Final    STAPHYLOCOCCUS SPECIES (COAGULASE NEGATIVE) THE SIGNIFICANCE OF ISOLATING THIS ORGANISM FROM A SINGLE SET OF BLOOD CULTURES WHEN MULTIPLE SETS ARE DRAWN IS UNCERTAIN. PLEASE NOTIFY THE MICROBIOLOGY DEPARTMENT WITHIN ONE WEEK IF SPECIATION AND SENSITIVITIES ARE REQUIRED. Performed at [place], [place] [address] [place],  [address]    Report Status 01/06/2018 FINAL  Final  Blood Culture ID Panel (Reflexed)     Status: Abnormal   Collection Time: 01/03/18  7:30 PM  Result Value Ref Range Status   Enterococcus species NOT DETECTED NOT DETECTED Final   Listeria monocytogenes NOT DETECTED NOT DETECTED Final   Staphylococcus species DETECTED (A) NOT DETECTED Final    Comment: Methicillin (oxacillin) susceptible coagulase negative staphylococcus. Possible blood culture contaminant (unless isolated from more than one blood culture draw or clinical case suggests pathogenicity). No antibiotic treatment is indicated for blood  culture contaminants. CRITICAL RESULT CALLED TO, READ BACK BY AND VERIFIED WITHMarijo Conception RN [phone] BY GF    Staphylococcus aureus (BCID) NOT DETECTED NOT DETECTED Final   Methicillin resistance NOT DETECTED NOT DETECTED Final   Streptococcus species NOT DETECTED NOT DETECTED Final  Streptococcus agalactiae NOT DETECTED NOT DETECTED Final   Streptococcus pneumoniae NOT DETECTED NOT DETECTED Final   Streptococcus pyogenes NOT DETECTED NOT DETECTED Final   Acinetobacter baumannii NOT DETECTED NOT DETECTED Final   Enterobacteriaceae species NOT DETECTED NOT DETECTED Final   Enterobacter cloacae complex NOT DETECTED NOT DETECTED Final   Escherichia coli NOT DETECTED NOT DETECTED Final   Klebsiella oxytoca NOT DETECTED NOT DETECTED Final   Klebsiella pneumoniae NOT DETECTED NOT DETECTED Final   Proteus species NOT DETECTED NOT DETECTED Final   Serratia marcescens NOT DETECTED NOT DETECTED Final   Haemophilus influenzae NOT DETECTED NOT DETECTED Final   Neisseria meningitidis NOT DETECTED NOT DETECTED Final   Pseudomonas aeruginosa NOT DETECTED NOT DETECTED Final   Candida albicans NOT DETECTED NOT DETECTED Final   Candida glabrata NOT DETECTED NOT DETECTED Final   Candida krusei NOT DETECTED NOT DETECTED Final   Candida parapsilosis NOT DETECTED NOT  DETECTED Final   Candida tropicalis NOT DETECTED NOT DETECTED Final    Comment: Performed at [facility], [place] [address], [place], [place] [address]  Urine Culture     Status: None   Collection Time: 01/03/18  8:40 PM  Result Value Ref Range Status   Specimen Description   Final    URINE, CLEAN CATCH Performed at [facility], [address] [place], [place] [address]    Special Requests   Final    NONE Performed at [facility], [address] [place], [place] [address]    Culture   Final    Multiple bacterial morphotypes present, none predominant. Suggest appropriate recollection if clinically indicated.   Report Status 01/05/2018 FINAL  Final     Studies: No results found.   Scheduled Meds: . insulin aspart  0-9 Units Subcutaneous TID WC  . [START ON 01/07/2018] insulin aspart  3 Units Subcutaneous TID WC  . [START ON 01/07/2018] insulin detemir  30 Units Subcutaneous Daily  . levothyroxine  25 mcg Oral QAC breakfast  . pravastatin  10 mg Oral Daily   Continuous Infusions:   Principal Problem:   Sepsis ([place]) Active Problems:   Type 2 diabetes mellitus ([name])   Prostate cancer ([name])   HLD (hyperlipidemia)   UTI (urinary tract infection)   Chronic anemia   Chronic indwelling Foley catheter   Elevated alkaline phosphatase level   Hypothyroidism   Physical deconditioning   Time spent: 28mins  Kathie Dike, MD [facility] Pager [phone]  If 7PM-7AM, please contact night-coverage [email or web address] Password [email or web address] 01/06/2018, 5:25 PM    LOS: 3 days

## 2018-01-07 DIAGNOSIS — E039 Hypothyroidism, unspecified: Secondary | ICD-10-CM

## 2018-01-07 LAB — CULTURE, BLOOD (ROUTINE X 2): Special Requests: ADEQUATE

## 2018-01-07 LAB — GLUCOSE, CAPILLARY
GLUCOSE-CAPILLARY: 196 mg/dL — AB (ref 70–99)
Glucose-Capillary: 153 mg/dL — ABNORMAL HIGH (ref 70–99)

## 2018-01-07 MED ORDER — LEVEMIR FLEXTOUCH 100 UNIT/ML ~~LOC~~ SOPN: 30.0000 [IU] | PEN_INJECTOR | Freq: Every day | SUBCUTANEOUS | 3 refills | Status: DC

## 2018-01-07 NOTE — Progress Notes (Signed)
CRITICAL VALUE ALERT  Critical Value:  Blood Culture Gram Positive Cocci   Date & Time Notied:  01/07/2018 1254  Provider Notified: Memon  Orders Received/Actions taken: No orders received at this moment

## 2018-01-07 NOTE — Discharge Summary (Signed)
Physician Discharge Summary  COBIN CADAVID ZSM:[id number] DOB: 1933/05/16 DOA: 01/03/2018  PCP: [name], MD  Admit date: 01/03/2018 Discharge date: 01/07/2018  Admitted From: Home Disposition: Home  Recommendations for Outpatient Follow-up:  1. Follow up with PCP in 1-2 weeks 2. Please obtain BMP/CBC in one week  Home Health: Home health PT Equipment/Devices:  Discharge Condition: Stable CODE STATUS: Full code Diet recommendation: Heart healthy, carb modified  Brief/Interim Summary: 84 y.o.malewith medical history significant ofchronic indwelling Foley catheter secondary to history of prostate cancer, hypothyroidism, hyperlipidemia presenting to the hospital for evaluation of fever.Patient reports having chills this evening and states he feels fatigued. Denies having any fevers. Reports having decreased p.o. intake. Denies having any abdominal pain, nausea, vomiting, or diarrhea.  He was having rigors.  He was admitted for sepsis with UTI.    Discharge Diagnoses:  Principal Problem:   Sepsis ([place]) Active Problems:   Type 2 diabetes mellitus ([name])   Prostate cancer ([name])   HLD (hyperlipidemia)   UTI (urinary tract infection)   Chronic anemia   Chronic indwelling Foley catheter   Elevated alkaline phosphatase level   Hypothyroidism   Physical deconditioning  1. Sepsis - secondary to UTI - Pt clinically is much improved with supportive care and IV antibiotics.  Blood cultures were 1 out of 2 positive for coagulase-negative staph, likely contaminant.  He does not have any further evidence of sepsis physiology. 2. Anemia of chronic disease - Follow Hg. Currently stable from admission at 9.1.   3. UTI - secondary to chronic indwelling foley.  Patient reports that catheter was recently replaced on 11/12.  Urine culture does not show any specific growth.  He is not having any further fevers.  Will discontinue further antibiotics. 4. Type 2 DM with hypoglycemia -  improved with hydration and resumed basal insulin, sliding scale coverage.  A1c 9.6, indicating poor long-term control.  Not in DKA.    Patient had repeated episodes of hypoglycemia in the hospital.  Levemir dose has been decreased from twice daily to once daily.  He is been advised to follow his blood sugars closely at home.  He should follow-up with his primary care physician for further management.. 5. Hypothyroidism - resumed on home levothyroxine.  6. Hyperlipidemia - resumed on home pravastatin.  7. Hypokalemia.  Replaced  Discharge Instructions  Discharge Instructions    Diet - low sodium heart healthy   Complete by:  As directed    Increase activity slowly   Complete by:  As directed      Allergies as of 01/07/2018   No Known Allergies     Medication List    TAKE these medications   aspirin EC 81 MG tablet Take 81 mg by mouth daily.   LEVEMIR FLEXTOUCH 100 UNIT/ML Pen Generic drug:  Insulin Detemir Inject 30 Units into the skin daily. What changed:    how much to take  how to take this  when to take this   levothyroxine 25 MCG tablet Commonly known as:  SYNTHROID, LEVOTHROID Take 25 mcg by mouth daily before breakfast.   pravastatin 10 MG tablet Commonly known as:  PRAVACHOL Take 10 mg by mouth daily.      Follow-up Information    [name], MD Follow up in 1 week(s).   Specialty:  Internal Medicine Contact information: [address] [place] [address] [phone]          No Known Allergies  Consultations:     Procedures/Studies: Dg  Chest 2 View  Result Date: 01/03/2018 CLINICAL DATA:  Sepsis, fever, no pain EXAM: CHEST - 2 VIEW COMPARISON:  09/26/2007 FINDINGS: There is no focal parenchymal opacity. There is no pleural effusion or pneumothorax. The heart and mediastinal contours are unremarkable. There is mild osteoarthritis of bilateral glenohumeral joints, left greater than right. IMPRESSION: No active cardiopulmonary  disease. Electronically Signed   By: Kathreen Devoid   On: 01/03/2018 20:23       Subjective: No new complaints, feels good, ready to go home.  Discharge Exam: Vitals:   01/06/18 0957 01/06/18 1347 01/06/18 2122 01/07/18 0511  BP:  (!) 157/66 138/71 (!) 142/86  Pulse:  98 98 (!) 107  Resp:  18 18 18   Temp:  97.9 F (36.6 C) 98.9 F (37.2 C) 98 F (36.7 C)  TempSrc:  Oral Oral Oral  SpO2: 99% 99% 95% 95%  Weight:      Height:        General: Pt is alert, awake, not in acute distress Cardiovascular: RRR, S1/S2 +, no rubs, no gallops Respiratory: CTA bilaterally, no wheezing, no rhonchi Abdominal: Soft, NT, ND, bowel sounds + Extremities: no edema, no cyanosis    The results of significant diagnostics from this hospitalization (including imaging, microbiology, ancillary and laboratory) are listed below for reference.     Microbiology: Recent Results (from the past 240 hour(s))  Culture, blood (Routine x 2)     Status: None (Preliminary result)   Collection Time: 01/03/18  7:30 PM  Result Value Ref Range Status   Specimen Description RIGHT ANTECUBITAL  Final   Special Requests   Final    BOTTLES DRAWN AEROBIC AND ANAEROBIC Blood Culture adequate volume   Culture   Final    NO GROWTH 4 DAYS Performed at [facility], [address], [place], [place] [address]    Report Status PENDING  Incomplete  Culture, blood (Routine x 2)     Status: Abnormal   Collection Time: 01/03/18  7:30 PM  Result Value Ref Range Status   Specimen Description   Final    LEFT ANTECUBITAL Performed at [facility], [address] [place], [place] [address]    Special Requests   Final    BOTTLES DRAWN AEROBIC AND ANAEROBIC Blood Culture adequate volume Performed at [facility], [address] [place], [place] [address]    Culture  Setup Time   Final    GRAM POSITIVE COCCI Gram Stain Report Called to,Read Back By and Verified With: MAYS,J. AT 1606 ON 01/04/2018 BY EVA ANAEROBIC BOTTLE ONLY  Performed at [facility] TO, READ BACK BY AND VERIFIED WITHMarijo Conception RN [phone] BY GF GRAM POSITIVE COCCI AEROBIC Gram Stain Report Called to,Read Back By and Verified With: TAYLOR M. AT 1250 ON [id number] BY THOMPSON S. Performed at [facility], [address] [place], [place] [address]    Culture (A)  Final    STAPHYLOCOCCUS SPECIES (COAGULASE NEGATIVE) THE SIGNIFICANCE OF ISOLATING THIS ORGANISM FROM A SINGLE SET OF BLOOD CULTURES WHEN MULTIPLE SETS ARE DRAWN IS UNCERTAIN. PLEASE NOTIFY THE MICROBIOLOGY DEPARTMENT WITHIN ONE WEEK IF SPECIATION AND SENSITIVITIES ARE REQUIRED. Performed at [facility], [place] [address], [place],  [address]    Report Status 01/06/2018 FINAL  Final  Blood Culture ID Panel (Reflexed)     Status: Abnormal   Collection Time: 01/03/18  7:30 PM  Result Value Ref Range Status  Enterococcus species NOT DETECTED NOT DETECTED Final   Listeria monocytogenes NOT DETECTED NOT DETECTED Final   Staphylococcus species DETECTED (A) NOT DETECTED Final    Comment: Methicillin (oxacillin) susceptible coagulase negative staphylococcus. Possible blood culture contaminant (unless isolated from more than one blood culture draw or clinical case suggests pathogenicity). No antibiotic treatment is indicated for blood  culture contaminants. CRITICAL RESULT CALLED TO, READ BACK BY AND VERIFIED WITHMarijo Conception RN [phone] BY GF    Staphylococcus aureus (BCID) NOT DETECTED NOT DETECTED Final   Methicillin resistance NOT DETECTED NOT DETECTED Final   Streptococcus species NOT DETECTED NOT DETECTED Final   Streptococcus agalactiae NOT DETECTED NOT DETECTED Final   Streptococcus pneumoniae NOT DETECTED NOT DETECTED Final   Streptococcus pyogenes NOT DETECTED NOT DETECTED Final   Acinetobacter baumannii NOT DETECTED NOT DETECTED Final   Enterobacteriaceae species NOT DETECTED NOT DETECTED Final    Enterobacter cloacae complex NOT DETECTED NOT DETECTED Final   Escherichia coli NOT DETECTED NOT DETECTED Final   Klebsiella oxytoca NOT DETECTED NOT DETECTED Final   Klebsiella pneumoniae NOT DETECTED NOT DETECTED Final   Proteus species NOT DETECTED NOT DETECTED Final   Serratia marcescens NOT DETECTED NOT DETECTED Final   Haemophilus influenzae NOT DETECTED NOT DETECTED Final   Neisseria meningitidis NOT DETECTED NOT DETECTED Final   Pseudomonas aeruginosa NOT DETECTED NOT DETECTED Final   Candida albicans NOT DETECTED NOT DETECTED Final   Candida glabrata NOT DETECTED NOT DETECTED Final   Candida krusei NOT DETECTED NOT DETECTED Final   Candida parapsilosis NOT DETECTED NOT DETECTED Final   Candida tropicalis NOT DETECTED NOT DETECTED Final    Comment: Performed at [facility], [address], [place], [place] [address]  Urine Culture     Status: None   Collection Time: 01/03/18  8:40 PM  Result Value Ref Range Status   Specimen Description   Final    URINE, CLEAN CATCH Performed at [facility], [address], [place], [place] [address]    Special Requests   Final    NONE Performed at [facility], [address] [place], [place] [address]    Culture   Final    Multiple bacterial morphotypes present, none predominant. Suggest appropriate recollection if clinically indicated.   Report Status 01/05/2018 FINAL  Final     Labs: BNP (last 3 results) No results for input(s): BNP in the last 8760 hours. Basic Metabolic Panel: Recent Labs  Lab 01/03/18 1929 01/05/18 0551 01/06/18 0609  NA 131* 135 136  K 3.9 3.2* 3.9  CL 98 104 102  CO2 21* 23 24  GLUCOSE 359* 57* 65*  BUN 23 13 13   CREATININE 1.07 0.74 0.81  CALCIUM 8.4* 8.3* 8.5*   Liver Function Tests: Recent Labs  Lab 01/03/18 1929 01/05/18 0551  AST 26 21  ALT 19 19  ALKPHOS 132* 141*  BILITOT 1.0 0.3  PROT 7.8 7.1  ALBUMIN 3.1* 2.6*   No results for input(s): LIPASE, AMYLASE in the last 168  hours. No results for input(s): AMMONIA in the last 168 hours. CBC: Recent Labs  Lab 01/03/18 1929 01/04/18 0757 01/05/18 0551  WBC 8.9 6.5 9.0  NEUTROABS 8.0*  --  5.9  HGB 9.2* 9.3* 9.1*  HCT 30.9* 30.6* 30.3*  MCV 83.3 84.8 82.1  PLT 283 253 328   Cardiac Enzymes: No results for input(s): CKTOTAL, CKMB, CKMBINDEX, TROPONINI in the last 168 hours. BNP: Invalid input(s): POCBNP CBG: Recent Labs  Lab 01/06/18 1125 01/06/18 1622 01/06/18 2126 01/07/18 0756 01/07/18 1113  GLUCAP 224* 180* 250* 153* 196*   D-Dimer No results for input(s): DDIMER in the last 72 hours. Hgb A1c No results for input(s): HGBA1C in the last 72 hours. Lipid Profile No results for input(s): CHOL, HDL, LDLCALC, TRIG, CHOLHDL, LDLDIRECT in the last 72 hours. Thyroid function studies No results for input(s): TSH, T4TOTAL, T3FREE, THYROIDAB in the last 72 hours.  Invalid input(s): FREET3 Anemia work up No results for input(s): VITAMINB12, FOLATE, FERRITIN, TIBC, IRON, RETICCTPCT in the last 72 hours. Urinalysis    Component Value Date/Time   COLORURINE AMBER (A) 01/03/2018 2040   APPEARANCEUR CLOUDY (A) 01/03/2018 2040   LABSPEC 1.022 01/03/2018 2040   PHURINE 6.0 01/03/2018 2040   GLUCOSEU >=500 (A) 01/03/2018 2040   HGBUR MODERATE (A) 01/03/2018 2040   BILIRUBINUR NEGATIVE 01/03/2018 2040   KETONESUR NEGATIVE 01/03/2018 2040   PROTEINUR 100 (A) 01/03/2018 2040   UROBILINOGEN 0.2 09/03/2013 0132   NITRITE NEGATIVE 01/03/2018 2040   LEUKOCYTESUR LARGE (A) 01/03/2018 2040   Sepsis Labs Invalid input(s): PROCALCITONIN,  WBC,  LACTICIDVEN Microbiology Recent Results (from the past 240 hour(s))  Culture, blood (Routine x 2)     Status: None (Preliminary result)   Collection Time: 01/03/18  7:30 PM  Result Value Ref Range Status   Specimen Description RIGHT ANTECUBITAL  Final   Special Requests   Final    BOTTLES DRAWN AEROBIC AND ANAEROBIC Blood Culture adequate volume   Culture    Final    NO GROWTH 4 DAYS Performed at [facility], [address] [place], [place] [address]    Report Status PENDING  Incomplete  Culture, blood (Routine x 2)     Status: Abnormal   Collection Time: 01/03/18  7:30 PM  Result Value Ref Range Status   Specimen Description   Final    LEFT ANTECUBITAL Performed at [facility], [address] [place], [place] [address]    Special Requests   Final    BOTTLES DRAWN AEROBIC AND ANAEROBIC Blood Culture adequate volume Performed at [facility], [address] [place], [place] [address]    Culture  Setup Time   Final    GRAM POSITIVE COCCI Gram Stain Report Called to,Read Back By and Verified With: MAYS,J. AT 1606 ON 01/04/2018 BY EVA ANAEROBIC BOTTLE ONLY Performed at [place] TO, READ BACK BY AND VERIFIED WITH: [place] [phone] BY GF GRAM POSITIVE COCCI AEROBIC Gram Stain Report Called to,Read Back By and Verified With: TAYLOR M. AT 1250 ON [id number] BY THOMPSON S. Performed at [facility], [address], [place], [place] [address]    Culture (A)  Final    STAPHYLOCOCCUS SPECIES (COAGULASE NEGATIVE) THE SIGNIFICANCE OF ISOLATING THIS ORGANISM FROM A SINGLE SET OF BLOOD CULTURES WHEN MULTIPLE SETS ARE DRAWN IS UNCERTAIN. PLEASE NOTIFY THE MICROBIOLOGY DEPARTMENT WITHIN ONE WEEK IF SPECIATION AND SENSITIVITIES ARE REQUIRED. Performed at [facility], [place] [address], [place],  [address]    Report Status 01/06/2018 FINAL  Final  Blood Culture ID Panel (Reflexed)     Status: Abnormal   Collection Time: 01/03/18  7:30 PM  Result Value Ref Range Status   Enterococcus species NOT DETECTED NOT DETECTED Final   Listeria monocytogenes NOT DETECTED NOT DETECTED Final   Staphylococcus species DETECTED (A) NOT DETECTED Final    Comment: Methicillin (oxacillin) susceptible coagulase negative staphylococcus.  Possible blood culture contaminant (unless isolated  from more than one blood culture draw or clinical case suggests pathogenicity). No antibiotic treatment is indicated for blood  culture contaminants. CRITICAL RESULT CALLED TO, READ BACK BY AND VERIFIED WITHMarijo Conception RN [phone] BY GF    Staphylococcus aureus (BCID) NOT DETECTED NOT DETECTED Final   Methicillin resistance NOT DETECTED NOT DETECTED Final   Streptococcus species NOT DETECTED NOT DETECTED Final   Streptococcus agalactiae NOT DETECTED NOT DETECTED Final   Streptococcus pneumoniae NOT DETECTED NOT DETECTED Final   Streptococcus pyogenes NOT DETECTED NOT DETECTED Final   Acinetobacter baumannii NOT DETECTED NOT DETECTED Final   Enterobacteriaceae species NOT DETECTED NOT DETECTED Final   Enterobacter cloacae complex NOT DETECTED NOT DETECTED Final   Escherichia coli NOT DETECTED NOT DETECTED Final   Klebsiella oxytoca NOT DETECTED NOT DETECTED Final   Klebsiella pneumoniae NOT DETECTED NOT DETECTED Final   Proteus species NOT DETECTED NOT DETECTED Final   Serratia marcescens NOT DETECTED NOT DETECTED Final   Haemophilus influenzae NOT DETECTED NOT DETECTED Final   Neisseria meningitidis NOT DETECTED NOT DETECTED Final   Pseudomonas aeruginosa NOT DETECTED NOT DETECTED Final   Candida albicans NOT DETECTED NOT DETECTED Final   Candida glabrata NOT DETECTED NOT DETECTED Final   Candida krusei NOT DETECTED NOT DETECTED Final   Candida parapsilosis NOT DETECTED NOT DETECTED Final   Candida tropicalis NOT DETECTED NOT DETECTED Final    Comment: Performed at [facility], [address] [place], [place] [address]  Urine Culture     Status: None   Collection Time: 01/03/18  8:40 PM  Result Value Ref Range Status   Specimen Description   Final    URINE, CLEAN CATCH Performed at [facility], [address] [place], [place] [address]    Special Requests   Final    NONE Performed at [facility], [address], [place], [place] [address]    Culture   Final     Multiple bacterial morphotypes present, none predominant. Suggest appropriate recollection if clinically indicated.   Report Status 01/05/2018 FINAL  Final     Time coordinating discharge: 44mins  SIGNED:   Kathie Dike, MD  [facility] 01/07/2018, 8:01 PM Pager   If 7PM-7AM, please contact night-coverage [email or web address] Password [email or web address]

## 2018-01-07 NOTE — Progress Notes (Signed)
Physical Therapy Treatment Patient Details Name: Anthony Dunlap MRN: [id number] DOB: 1933-11-03 Today's Date: 01/07/2018    History of Present Illness Anthony Dunlap is a 84 y.o. male with medical history significant of chronic indwelling Foley catheter secondary to history of prostate cancer, hypothyroidism, hyperlipidemia presenting to the hospital for evaluation of fever.  Patient reports having chills this evening and states he feels fatigued.  Denies having any fevers.  Reports having decreased p.o. intake.  Denies having any abdominal pain, nausea, vomiting, or diarrhea.  Denies having any cough or shortness of breath.  Denies having any hematemesis, hematochezia, or melena.  States his urine looks dark sometimes.  States he had a Foley catheter initially placed over 6 months ago since his bladder was not emptying.  He is followed by urology and gets monthly Foley catheter changes.    PT Comments    Patient demonstrates much improvement for ambulation in hallways without loss of balance or use of side rails or walls while ambulating on level, inclined, and declined surfaces.  Patient demonstrates good return for completing BLE ROM/strengthening exercises while seated in chair and continued sitting up in chair after therapy.  Patient will benefit from continued physical therapy in hospital and recommended venue below to increase strength, balance, endurance for safe ADLs and gait.    Follow Up Recommendations  Home health PT;Supervision - Intermittent     Equipment Recommendations  None recommended by PT    Recommendations for Other Services       Precautions / Restrictions Precautions Precautions: Fall Restrictions Weight Bearing Restrictions: No    Mobility  Bed Mobility Overal bed mobility: Modified Independent             General bed mobility comments: Patient presents seated in chair (transferred by himself per patient)  Transfers Overall transfer level: Modified  independent   Transfers: Sit to/from Stand;Stand Pivot Transfers Sit to Stand: Modified independent (Device/Increase time) Stand pivot transfers: Modified independent (Device/Increase time)       General transfer comment: demonstrates good safety  Ambulation/Gait Ambulation/Gait assistance: Modified independent (Device/Increase time) Gait Distance (Feet): 200 Feet Assistive device: None Gait Pattern/deviations: Decreased step length - left;Decreased stance time - left;Decreased stride length;WFL(Within Functional Limits) Gait velocity: near normal   General Gait Details: grossly WFL except limited heel strike and step length of LLE secondary to left knee discomfort, demonstrates good return for ambulation on level, inclined, and declined surfaces without loss of balance   Stairs             Wheelchair Mobility    Modified Rankin (Stroke Patients Only)       Balance Overall balance assessment: Mild deficits observed, not formally tested                                          Cognition Arousal/Alertness: Awake/alert Behavior During Therapy: WFL for tasks assessed/performed Overall Cognitive Status: Within Functional Limits for tasks assessed                                        Exercises General Exercises - Lower Extremity Long Arc Quad: Seated;AROM;Strengthening;Both;10 reps Hip Flexion/Marching: Seated;AROM;Strengthening;Both;10 reps Toe Raises: AROM;Seated;Strengthening;Both;10 reps Heel Raises: Seated;AROM;Strengthening;Both;10 reps    General Comments        Pertinent Vitals/Pain  Pain Assessment: No/denies pain    Home Living                      Prior Function            PT Goals (current goals can now be found in the care plan section) Acute Rehab PT Goals Patient Stated Goal: return home with family to assist PT Goal Formulation: With patient Time For Goal Achievement: 01/13/18 Potential to  Achieve Goals: Good Progress towards PT goals: Progressing toward goals    Frequency    Min 3X/week      PT Plan Current plan remains appropriate    Co-evaluation              AM-PAC PT "6 Clicks" Daily Activity  Outcome Measure  Difficulty turning over in bed (including adjusting bedclothes, sheets and blankets)?: None Difficulty moving from lying on back to sitting on the side of the bed? : None Difficulty sitting down on and standing up from a chair with arms (e.g., wheelchair, bedside commode, etc,.)?: None Help needed moving to and from a bed to chair (including a wheelchair)?: None Help needed walking in hospital room?: None Help needed climbing 3-5 steps with a railing? : A Little 6 Click Score: 23    End of Session Equipment Utilized During Treatment: Gait belt Activity Tolerance: Patient tolerated treatment well Patient left: in chair;with call bell/phone within reach Nurse Communication: Mobility status PT Visit Diagnosis: Unsteadiness on feet (R26.81);Other abnormalities of gait and mobility (R26.89);Muscle weakness (generalized) (M62.81)     Time: 1694-5038 PT Time Calculation (min) (ACUTE ONLY): 29 min  Charges:  $Gait Training: 8-22 mins $Therapeutic Exercise: 8-22 mins                     12:03 PM, 01/07/18 Lonell Grandchild, MPT Physical Therapist with [facility] [phone] office [phone] mobile phone

## 2018-01-07 NOTE — Care Management Important Message (Signed)
Important Message  Patient Details  Name: Anthony Dunlap MRN: [id number] Date of Birth: 1934/01/17   Medicare Important Message Given:  Yes    Sherald Barge, RN 01/07/2018, 11:59 AM

## 2018-01-07 NOTE — Progress Notes (Signed)
Patient discharged home.  Reviewed AVS and medication changes - verbalizes understanding.  Assisted off unit in NAD.

## 2018-01-08 LAB — CULTURE, BLOOD (ROUTINE X 2)
Culture: NO GROWTH
SPECIAL REQUESTS: ADEQUATE

## 2018-01-17 ENCOUNTER — Inpatient Hospital Stay (HOSPITAL_COMMUNITY)
Admission: EM | Admit: 2018-01-17 | Discharge: 2018-02-18 | DRG: 871 | Disposition: A | Discharge status: Skilled Nursing Facility | Payer: Medicare Other | Attending: Family Medicine | Admitting: Family Medicine

## 2018-01-17 ENCOUNTER — Encounter (HOSPITAL_COMMUNITY): Payer: Self-pay | Admitting: Emergency Medicine

## 2018-01-17 ENCOUNTER — Other Ambulatory Visit: Payer: Self-pay

## 2018-01-17 DIAGNOSIS — K6812 Psoas muscle abscess: Secondary | ICD-10-CM | POA: Diagnosis present

## 2018-01-17 DIAGNOSIS — K611 Rectal abscess: Secondary | ICD-10-CM | POA: Diagnosis not present

## 2018-01-17 DIAGNOSIS — E871 Hypo-osmolality and hyponatremia: Secondary | ICD-10-CM | POA: Diagnosis present

## 2018-01-17 DIAGNOSIS — Z978 Presence of other specified devices: Secondary | ICD-10-CM | POA: Diagnosis not present

## 2018-01-17 DIAGNOSIS — B952 Enterococcus as the cause of diseases classified elsewhere: Secondary | ICD-10-CM

## 2018-01-17 DIAGNOSIS — Z79899 Other long term (current) drug therapy: Secondary | ICD-10-CM | POA: Diagnosis not present

## 2018-01-17 DIAGNOSIS — I2510 Atherosclerotic heart disease of native coronary artery without angina pectoris: Secondary | ICD-10-CM | POA: Diagnosis present

## 2018-01-17 DIAGNOSIS — E11649 Type 2 diabetes mellitus with hypoglycemia without coma: Secondary | ICD-10-CM | POA: Diagnosis not present

## 2018-01-17 DIAGNOSIS — L89811 Pressure ulcer of head, stage 1: Secondary | ICD-10-CM | POA: Diagnosis present

## 2018-01-17 DIAGNOSIS — R509 Fever, unspecified: Secondary | ICD-10-CM | POA: Diagnosis not present

## 2018-01-17 DIAGNOSIS — N433 Hydrocele, unspecified: Secondary | ICD-10-CM | POA: Diagnosis present

## 2018-01-17 DIAGNOSIS — L02415 Cutaneous abscess of right lower limb: Secondary | ICD-10-CM | POA: Diagnosis not present

## 2018-01-17 DIAGNOSIS — E1110 Type 2 diabetes mellitus with ketoacidosis without coma: Secondary | ICD-10-CM | POA: Diagnosis present

## 2018-01-17 DIAGNOSIS — L02416 Cutaneous abscess of left lower limb: Secondary | ICD-10-CM

## 2018-01-17 DIAGNOSIS — Z794 Long term (current) use of insulin: Secondary | ICD-10-CM | POA: Diagnosis not present

## 2018-01-17 DIAGNOSIS — E785 Hyperlipidemia, unspecified: Secondary | ICD-10-CM | POA: Diagnosis present

## 2018-01-17 DIAGNOSIS — M1611 Unilateral primary osteoarthritis, right hip: Secondary | ICD-10-CM | POA: Diagnosis present

## 2018-01-17 DIAGNOSIS — L8990 Pressure ulcer of unspecified site, unspecified stage: Secondary | ICD-10-CM

## 2018-01-17 DIAGNOSIS — R197 Diarrhea, unspecified: Secondary | ICD-10-CM | POA: Diagnosis not present

## 2018-01-17 DIAGNOSIS — R600 Localized edema: Secondary | ICD-10-CM | POA: Diagnosis not present

## 2018-01-17 DIAGNOSIS — B966 Bacteroides fragilis [B. fragilis] as the cause of diseases classified elsewhere: Secondary | ICD-10-CM | POA: Diagnosis not present

## 2018-01-17 DIAGNOSIS — Z8546 Personal history of malignant neoplasm of prostate: Secondary | ICD-10-CM | POA: Diagnosis not present

## 2018-01-17 DIAGNOSIS — E039 Hypothyroidism, unspecified: Secondary | ICD-10-CM | POA: Diagnosis present

## 2018-01-17 DIAGNOSIS — J90 Pleural effusion, not elsewhere classified: Secondary | ICD-10-CM | POA: Diagnosis present

## 2018-01-17 DIAGNOSIS — A419 Sepsis, unspecified organism: Secondary | ICD-10-CM | POA: Diagnosis present

## 2018-01-17 DIAGNOSIS — R319 Hematuria, unspecified: Secondary | ICD-10-CM

## 2018-01-17 DIAGNOSIS — M868X8 Other osteomyelitis, other site: Secondary | ICD-10-CM | POA: Diagnosis present

## 2018-01-17 DIAGNOSIS — D638 Anemia in other chronic diseases classified elsewhere: Secondary | ICD-10-CM | POA: Diagnosis present

## 2018-01-17 DIAGNOSIS — A414 Sepsis due to anaerobes: Secondary | ICD-10-CM | POA: Diagnosis present

## 2018-01-17 DIAGNOSIS — B954 Other streptococcus as the cause of diseases classified elsewhere: Secondary | ICD-10-CM | POA: Diagnosis not present

## 2018-01-17 DIAGNOSIS — N390 Urinary tract infection, site not specified: Secondary | ICD-10-CM | POA: Diagnosis present

## 2018-01-17 DIAGNOSIS — A498 Other bacterial infections of unspecified site: Secondary | ICD-10-CM

## 2018-01-17 DIAGNOSIS — E119 Type 2 diabetes mellitus without complications: Secondary | ICD-10-CM | POA: Diagnosis not present

## 2018-01-17 DIAGNOSIS — B961 Klebsiella pneumoniae [K. pneumoniae] as the cause of diseases classified elsewhere: Secondary | ICD-10-CM | POA: Diagnosis not present

## 2018-01-17 DIAGNOSIS — E876 Hypokalemia: Secondary | ICD-10-CM | POA: Diagnosis present

## 2018-01-17 DIAGNOSIS — R739 Hyperglycemia, unspecified: Secondary | ICD-10-CM

## 2018-01-17 DIAGNOSIS — D509 Iron deficiency anemia, unspecified: Secondary | ICD-10-CM | POA: Diagnosis not present

## 2018-01-17 DIAGNOSIS — Z7982 Long term (current) use of aspirin: Secondary | ICD-10-CM | POA: Diagnosis not present

## 2018-01-17 DIAGNOSIS — M60009 Infective myositis, unspecified site: Secondary | ICD-10-CM | POA: Diagnosis present

## 2018-01-17 DIAGNOSIS — L0291 Cutaneous abscess, unspecified: Secondary | ICD-10-CM

## 2018-01-17 DIAGNOSIS — A4189 Other specified sepsis: Secondary | ICD-10-CM | POA: Diagnosis present

## 2018-01-17 DIAGNOSIS — K651 Peritoneal abscess: Secondary | ICD-10-CM | POA: Diagnosis not present

## 2018-01-17 DIAGNOSIS — E1169 Type 2 diabetes mellitus with other specified complication: Secondary | ICD-10-CM | POA: Diagnosis present

## 2018-01-17 DIAGNOSIS — Z79890 Hormone replacement therapy (postmenopausal): Secondary | ICD-10-CM | POA: Diagnosis not present

## 2018-01-17 DIAGNOSIS — N281 Cyst of kidney, acquired: Secondary | ICD-10-CM | POA: Diagnosis present

## 2018-01-17 DIAGNOSIS — M729 Fibroblastic disorder, unspecified: Secondary | ICD-10-CM | POA: Diagnosis not present

## 2018-01-17 DIAGNOSIS — B9689 Other specified bacterial agents as the cause of diseases classified elsewhere: Secondary | ICD-10-CM | POA: Diagnosis not present

## 2018-01-17 DIAGNOSIS — R7989 Other specified abnormal findings of blood chemistry: Secondary | ICD-10-CM

## 2018-01-17 DIAGNOSIS — D649 Anemia, unspecified: Secondary | ICD-10-CM | POA: Diagnosis present

## 2018-01-17 DIAGNOSIS — D72829 Elevated white blood cell count, unspecified: Secondary | ICD-10-CM | POA: Diagnosis not present

## 2018-01-17 DIAGNOSIS — J9811 Atelectasis: Secondary | ICD-10-CM | POA: Diagnosis present

## 2018-01-17 DIAGNOSIS — I361 Nonrheumatic tricuspid (valve) insufficiency: Secondary | ICD-10-CM | POA: Diagnosis not present

## 2018-01-17 DIAGNOSIS — M25452 Effusion, left hip: Secondary | ICD-10-CM | POA: Diagnosis not present

## 2018-01-17 DIAGNOSIS — M869 Osteomyelitis, unspecified: Secondary | ICD-10-CM | POA: Diagnosis not present

## 2018-01-17 DIAGNOSIS — K5900 Constipation, unspecified: Secondary | ICD-10-CM | POA: Diagnosis present

## 2018-01-17 DIAGNOSIS — R945 Abnormal results of liver function studies: Secondary | ICD-10-CM

## 2018-01-17 LAB — URINALYSIS, ROUTINE W REFLEX MICROSCOPIC
BILIRUBIN URINE: NEGATIVE
Glucose, UA: >=500 mg/dL — AB
KETONES UR: 5 mg/dL — AB
Nitrite: POSITIVE — AB
Protein, ur: 30 mg/dL — AB
SPECIFIC GRAVITY, URINE: 1.022 (ref 1.005–1.030)
pH: 5 (ref 5.0–8.0)

## 2018-01-17 LAB — CBC
HEMATOCRIT: 28.4 % — AB (ref 39.0–52.0)
HEMOGLOBIN: 8.7 g/dL — AB (ref 13.0–17.0)
MCH: 25.1 pg — AB (ref 26.0–34.0)
MCHC: 30.6 g/dL (ref 30.0–36.0)
MCV: 81.8 fL (ref 80.0–100.0)
NRBC: 0 % (ref 0.0–0.2)
Platelets: 649 10*3/uL — ABNORMAL HIGH (ref 150–400)
RBC: 3.47 MIL/uL — AB (ref 4.22–5.81)
RDW: 14 % (ref 11.5–15.5)
WBC: 20.8 10*3/uL — ABNORMAL HIGH (ref 4.0–10.5)

## 2018-01-17 LAB — LACTIC ACID, PLASMA
LACTIC ACID, VENOUS: 2.6 mmol/L — AB (ref 0.5–1.9)
Lactic Acid, Venous: 1.3 mmol/L (ref 0.5–1.9)
Lactic Acid, Venous: 2.1 mmol/L (ref 0.5–1.9)
Lactic Acid, Venous: 1 mmol/L (ref 0.5–1.9)

## 2018-01-17 LAB — BASIC METABOLIC PANEL
Anion gap: 7 (ref 5–15)
Anion gap: 9 (ref 5–15)
BUN: 19 mg/dL (ref 8–23)
BUN: 17 mg/dL (ref 8–23)
CHLORIDE: 104 mmol/L (ref 98–111)
CO2: 20 mmol/L — ABNORMAL LOW (ref 22–32)
CO2: 21 mmol/L — ABNORMAL LOW (ref 22–32)
Calcium: 7.2 mg/dL — ABNORMAL LOW (ref 8.9–10.3)
Calcium: 8.2 mg/dL — ABNORMAL LOW (ref 8.9–10.3)
Chloride: 108 mmol/L (ref 98–111)
Creatinine, Ser: 0.97 mg/dL (ref 0.61–1.24)
Creatinine, Ser: 0.82 mg/dL (ref 0.61–1.24)
GFR calc Af Amer: >60 mL/min (ref >60)
GFR calc Af Amer: >60 mL/min (ref >60)
GFR calc non Af Amer: >60 mL/min (ref >60)
GFR calc non Af Amer: >60 mL/min (ref >60)
Glucose, Bld: 347 mg/dL — ABNORMAL HIGH (ref 70–99)
Glucose, Bld: 129 mg/dL — ABNORMAL HIGH (ref 70–99)
Potassium: 3.6 mmol/L (ref 3.5–5.1)
Potassium: 3.9 mmol/L (ref 3.5–5.1)
Sodium: 131 mmol/L — ABNORMAL LOW (ref 135–145)
Sodium: 138 mmol/L (ref 135–145)

## 2018-01-17 LAB — GLUCOSE, CAPILLARY
Glucose-Capillary: 178 mg/dL — ABNORMAL HIGH (ref 70–99)
Glucose-Capillary: 116 mg/dL — ABNORMAL HIGH (ref 70–99)
Glucose-Capillary: 97 mg/dL (ref 70–99)
Glucose-Capillary: 128 mg/dL — ABNORMAL HIGH (ref 70–99)
Glucose-Capillary: 194 mg/dL — ABNORMAL HIGH (ref 70–99)

## 2018-01-17 LAB — COMPREHENSIVE METABOLIC PANEL
ALK PHOS: 152 U/L — AB (ref 38–126)
ALT: 15 U/L (ref 0–44)
AST: 19 U/L (ref 15–41)
Albumin: 2.5 g/dL — ABNORMAL LOW (ref 3.5–5.0)
Anion gap: 12 (ref 5–15)
BILIRUBIN TOTAL: 0.7 mg/dL (ref 0.3–1.2)
BUN: 20 mg/dL (ref 8–23)
CO2: 19 mmol/L — ABNORMAL LOW (ref 22–32)
CREATININE: 1.07 mg/dL (ref 0.61–1.24)
Calcium: 7.9 mg/dL — ABNORMAL LOW (ref 8.9–10.3)
Chloride: 96 mmol/L — ABNORMAL LOW (ref 98–111)
GFR calc Af Amer: >60 mL/min (ref >60)
GFR calc non Af Amer: >60 mL/min (ref >60)
Glucose, Bld: 440 mg/dL — ABNORMAL HIGH (ref 70–99)
Potassium: 4 mmol/L (ref 3.5–5.1)
Sodium: 127 mmol/L — ABNORMAL LOW (ref 135–145)
TOTAL PROTEIN: 7.2 g/dL (ref 6.5–8.1)

## 2018-01-17 LAB — BETA-HYDROXYBUTYRIC ACID: Beta-Hydroxybutyric Acid: 0.09 mmol/L (ref 0.05–0.27)

## 2018-01-17 LAB — CBG MONITORING, ED
GLUCOSE-CAPILLARY: 412 mg/dL — AB (ref 70–99)
Glucose-Capillary: 390 mg/dL — ABNORMAL HIGH (ref 70–99)
Glucose-Capillary: 317 mg/dL — ABNORMAL HIGH (ref 70–99)
Glucose-Capillary: 246 mg/dL — ABNORMAL HIGH (ref 70–99)

## 2018-01-17 LAB — PROTIME-INR
INR: 1.31
PROTHROMBIN TIME: 16.1 s — AB (ref 11.4–15.2)

## 2018-01-17 LAB — APTT: aPTT: 37 seconds — ABNORMAL HIGH (ref 24–36)

## 2018-01-17 LAB — MRSA PCR SCREENING: MRSA by PCR: POSITIVE — AB

## 2018-01-17 MED ORDER — SODIUM CHLORIDE 0.9 % IV SOLN
1.0000 g | Freq: Two times a day (BID) | INTRAVENOUS | Status: DC
  Administered 2018-01-17 – 2018-01-19 (×5): 1 g via INTRAVENOUS
  Filled 2018-01-17 (×7): qty 1

## 2018-01-17 MED ORDER — INSULIN ASPART 100 UNIT/ML ~~LOC~~ SOLN
0.0000 [IU] | Freq: Three times a day (TID) | SUBCUTANEOUS | Status: DC
  Administered 2018-01-17: 1 [IU] via SUBCUTANEOUS
  Administered 2018-01-18 (×2): 2 [IU] via SUBCUTANEOUS
  Administered 2018-01-19: 1 [IU] via SUBCUTANEOUS
  Administered 2018-01-19: 3 [IU] via SUBCUTANEOUS
  Administered 2018-01-20: 1 [IU] via SUBCUTANEOUS
  Administered 2018-01-20 – 2018-01-21 (×2): 3 [IU] via SUBCUTANEOUS
  Administered 2018-01-21: 2 [IU] via SUBCUTANEOUS
  Administered 2018-01-22: 1 [IU] via SUBCUTANEOUS
  Administered 2018-01-22 – 2018-01-23 (×2): 3 [IU] via SUBCUTANEOUS
  Administered 2018-01-23: 2 [IU] via SUBCUTANEOUS
  Administered 2018-01-23: 5 [IU] via SUBCUTANEOUS
  Administered 2018-01-24: 3 [IU] via SUBCUTANEOUS
  Administered 2018-01-25: 2 [IU] via SUBCUTANEOUS
  Administered 2018-01-26 (×2): 3 [IU] via SUBCUTANEOUS
  Administered 2018-01-27 – 2018-01-28 (×4): 2 [IU] via SUBCUTANEOUS
  Administered 2018-01-28: 3 [IU] via SUBCUTANEOUS
  Administered 2018-01-30: 5 [IU] via SUBCUTANEOUS
  Administered 2018-01-30 – 2018-01-31 (×2): 3 [IU] via SUBCUTANEOUS
  Administered 2018-01-31: 2 [IU] via SUBCUTANEOUS
  Administered 2018-01-31: 7 [IU] via SUBCUTANEOUS
  Administered 2018-02-01: 3 [IU] via SUBCUTANEOUS
  Administered 2018-02-01: 1 [IU] via SUBCUTANEOUS
  Administered 2018-02-02 (×3): 2 [IU] via SUBCUTANEOUS
  Administered 2018-02-03: 7 [IU] via SUBCUTANEOUS
  Administered 2018-02-03: 2 [IU] via SUBCUTANEOUS
  Administered 2018-02-03: 9 [IU] via SUBCUTANEOUS
  Administered 2018-02-04 (×2): 1 [IU] via SUBCUTANEOUS
  Administered 2018-02-05: 3 [IU] via SUBCUTANEOUS
  Administered 2018-02-05: 1 [IU] via SUBCUTANEOUS
  Administered 2018-02-06: 2 [IU] via SUBCUTANEOUS
  Administered 2018-02-06: 3 [IU] via SUBCUTANEOUS
  Administered 2018-02-07: 5 [IU] via SUBCUTANEOUS
  Administered 2018-02-07 – 2018-02-08 (×3): 2 [IU] via SUBCUTANEOUS
  Administered 2018-02-09: 1 [IU] via SUBCUTANEOUS
  Administered 2018-02-09: 2 [IU] via SUBCUTANEOUS
  Administered 2018-02-10: 5 [IU] via SUBCUTANEOUS
  Administered 2018-02-10: 3 [IU] via SUBCUTANEOUS
  Administered 2018-02-10: 2 [IU] via SUBCUTANEOUS
  Administered 2018-02-12 (×2): 3 [IU] via SUBCUTANEOUS
  Administered 2018-02-13: 2 [IU] via SUBCUTANEOUS
  Administered 2018-02-14: 1 [IU] via SUBCUTANEOUS
  Administered 2018-02-15: 2 [IU] via SUBCUTANEOUS
  Administered 2018-02-15: 3 [IU] via SUBCUTANEOUS
  Administered 2018-02-15 – 2018-02-16 (×2): 2 [IU] via SUBCUTANEOUS
  Administered 2018-02-16 – 2018-02-17 (×2): 7 [IU] via SUBCUTANEOUS
  Administered 2018-02-17: 15 [IU] via SUBCUTANEOUS
  Administered 2018-02-17: 1 [IU] via SUBCUTANEOUS

## 2018-01-17 MED ORDER — ASPIRIN EC 81 MG PO TBEC
81.0000 mg | DELAYED_RELEASE_TABLET | Freq: Every day | ORAL | Status: DC
  Administered 2018-01-17 – 2018-01-31 (×14): 81 mg via ORAL
  Filled 2018-01-17 (×14): qty 1

## 2018-01-17 MED ORDER — SODIUM CHLORIDE 0.9 % IV BOLUS (SEPSIS)
250.0000 mL | Freq: Once | INTRAVENOUS | Status: AC
  Administered 2018-01-17: 250 mL via INTRAVENOUS

## 2018-01-17 MED ORDER — SODIUM CHLORIDE 0.9 % IV SOLN: INTRAVENOUS | Status: DC

## 2018-01-17 MED ORDER — ENOXAPARIN SODIUM 40 MG/0.4ML ~~LOC~~ SOLN
40.0000 mg | SUBCUTANEOUS | Status: DC
  Administered 2018-01-17 – 2018-01-24 (×8): 40 mg via SUBCUTANEOUS
  Filled 2018-01-17 (×8): qty 0.4

## 2018-01-17 MED ORDER — SODIUM CHLORIDE 0.9 % IV BOLUS (SEPSIS)
1000.0000 mL | Freq: Once | INTRAVENOUS | Status: AC
  Administered 2018-01-17: 1000 mL via INTRAVENOUS

## 2018-01-17 MED ORDER — SODIUM CHLORIDE 0.9 % IV SOLN
INTRAVENOUS | Status: AC
  Administered 2018-01-17 (×2): via INTRAVENOUS

## 2018-01-17 MED ORDER — POTASSIUM CHLORIDE 10 MEQ/100ML IV SOLN
10.0000 meq | INTRAVENOUS | Status: AC
  Administered 2018-01-17: 10 meq via INTRAVENOUS
  Filled 2018-01-17: qty 100

## 2018-01-17 MED ORDER — ACETAMINOPHEN 325 MG PO TABS
650.0000 mg | ORAL_TABLET | Freq: Once | ORAL | Status: AC
  Administered 2018-01-17: 650 mg via ORAL
  Filled 2018-01-17: qty 2

## 2018-01-17 MED ORDER — INSULIN REGULAR(HUMAN) IN NACL 100-0.9 UT/100ML-% IV SOLN
INTRAVENOUS | Status: DC
  Administered 2018-01-17: 3.3 [IU]/h via INTRAVENOUS
  Filled 2018-01-17: qty 100

## 2018-01-17 MED ORDER — GLUCERNA SHAKE PO LIQD
237.0000 mL | Freq: Two times a day (BID) | ORAL | Status: DC
  Administered 2018-01-17 – 2018-02-13 (×42): 237 mL via ORAL

## 2018-01-17 MED ORDER — SODIUM CHLORIDE 0.9 % IV SOLN
2.0000 g | Freq: Once | INTRAVENOUS | Status: AC
  Administered 2018-01-17: 2 g via INTRAVENOUS
  Filled 2018-01-17: qty 2

## 2018-01-17 MED ORDER — INSULIN REGULAR(HUMAN) IN NACL 100-0.9 UT/100ML-% IV SOLN: INTRAVENOUS | Status: DC

## 2018-01-17 MED ORDER — DEXTROSE-NACL 5-0.45 % IV SOLN
INTRAVENOUS | Status: DC
  Administered 2018-01-17: 05:00:00 via INTRAVENOUS

## 2018-01-17 MED ORDER — INSULIN DETEMIR 100 UNIT/ML ~~LOC~~ SOLN
15.0000 [IU] | SUBCUTANEOUS | Status: DC
  Administered 2018-01-17: 15 [IU] via SUBCUTANEOUS
  Filled 2018-01-17 (×4): qty 0.15

## 2018-01-17 MED ORDER — PRAVASTATIN SODIUM 10 MG PO TABS
10.0000 mg | ORAL_TABLET | Freq: Every day | ORAL | Status: DC
  Administered 2018-01-17 – 2018-02-18 (×33): 10 mg via ORAL
  Filled 2018-01-17 (×33): qty 1

## 2018-01-17 MED ORDER — INSULIN DETEMIR 100 UNIT/ML ~~LOC~~ SOLN
20.0000 [IU] | Freq: Every day | SUBCUTANEOUS | Status: DC
  Administered 2018-01-17 – 2018-01-18 (×2): 20 [IU] via SUBCUTANEOUS
  Filled 2018-01-17 (×3): qty 0.2

## 2018-01-17 MED ORDER — LEVOTHYROXINE SODIUM 25 MCG PO TABS
25.0000 ug | ORAL_TABLET | Freq: Every day | ORAL | Status: DC
  Administered 2018-01-17 – 2018-02-12 (×27): 25 ug via ORAL
  Filled 2018-01-17 (×9): qty 1
  Filled 2018-01-17: qty 0.5
  Filled 2018-01-17 (×2): qty 1
  Filled 2018-01-17: qty 0.5
  Filled 2018-01-17 (×5): qty 1
  Filled 2018-01-17: qty 0.5
  Filled 2018-01-17 (×2): qty 1
  Filled 2018-01-17: qty 0.5
  Filled 2018-01-17 (×9): qty 1

## 2018-01-17 NOTE — Progress Notes (Signed)
[facility] Pharmacist could verify the patients medications- especially the Levimir to get pt off of insulin gtt. No answer the first call. Second call- RN was placed on hold by [facility] and pharmacist never came to phone.

## 2018-01-17 NOTE — Progress Notes (Signed)
Patient seen and examined. Admitted after midnight secondary to sepsis from UTI and mild DKA. Patient with recent admission due to UTI process; unfortunately he is indwelling catheter dependent and a high risk for re-infection. VS are stable currently and his CBG, Gap and Bicarb has achieved goal to transition off insulin drip. Will follow closely, advance diet and adjust hypoglycemic regimen. Continue IV antibiotics and follow culture results. Please refer to H&P written by Dr. Myna Hidalgo for further info/details on admission.   Barton Dubois MD [phone]

## 2018-01-17 NOTE — ED Provider Notes (Signed)
[facility] EMERGENCY DEPARTMENT Provider Note   CSN: [id number] Arrival date & time: 01/17/18  0203  Time seen 2:18 AM.   History   Chief Complaint Chief Complaint  Patient presents with  . Hyperglycemia   Level 5 caveat for confusion  HPI AMAIR [facility] is a 84 y.o. male.  HPI patient states about an hour ago he started getting the shakes.  He states the same thing that happened before and he thinks it was from high blood sugar.  EMS was called.  He denies nausea, vomiting, diarrhea, coughing, or feeling cold.  He does note today is Thanksgiving however he does not know the year, he does know where he is.  Patient has a history of urinary sepsis.  He was last admitted to the [facility] on November 15.  He was discharged on November 19th.  His blood cultures were 1 out of 2 positive for coagulase-negative staph likely contaminant, his urine culture did not have any specific growth, but multiple species.  PCP Anthony School, MD   Past Medical History:  Diagnosis Date  . Diabetes ([place])   . Left eye pain   . Prostate cancer [facility])     Patient Active Problem List   Diagnosis Date Noted  . DKA (diabetic ketoacidoses) ([facility]) 01/17/2018  . Normocytic anemia 01/17/2018  . Sepsis secondary to UTI ([place]) 01/03/2018  . UTI (urinary tract infection) 01/03/2018  . Chronic anemia 01/03/2018  . Chronic indwelling Foley catheter 01/03/2018  . Elevated alkaline phosphatase level 01/03/2018  . Hypothyroidism 01/03/2018  . Physical deconditioning 01/03/2018  . 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 HLD (hyperlipidemia) 08/19/2013  . Type 2 diabetes mellitus ([facility])   . Prostate cancer [facility])     Past Surgical History:  Procedure Laterality Date  . HERNIA REPAIR    . PROSTATE SURGERY          Home Medications    Prior to Admission medications   Medication Sig Start Date End Date Taking? Authorizing Provider  aspirin EC 81 MG tablet Take 81 mg by mouth daily.    [provider]  LEVEMIR FLEXTOUCH 100 UNIT/ML Pen Inject 30 Units into the skin daily. 01/07/18   Kathie Dike, MD  levothyroxine (SYNTHROID, LEVOTHROID) 25 MCG tablet Take 25 mcg by mouth daily before breakfast.    [provider]  pravastatin (PRAVACHOL) 10 MG tablet Take 10 mg by mouth daily.    [provider]    Family History Family History  Problem Relation Age of Onset  . Pneumonia Father   . Cancer Sister     Social History Social History   Tobacco Use  . Smoking status: Never Smoker  . Smokeless tobacco: Never Used  Substance Use Topics  . Alcohol use: No  . Drug use: No  Patient states he lives at home Patient states his nephew lives with him   Allergies   Patient has no known allergies.   Review of Systems Review of Systems  All other systems reviewed and are negative.    Physical Exam Updated Vital Signs BP (!) 164/78 (BP Location: Left Arm)   Pulse (!) 140   Temp 99.3 F (37.4 C) (Oral)   Resp 20   Ht 5\' 9"  (1.753 m)   Wt 70.3 kg   SpO2 94%   BMI 22.89 kg/m   Vital signs normal except for tachycardia, hypertension, and fever rectally of 103.8   Physical Exam  Constitutional: He is oriented  to person, place, and time. He appears well-developed and well-nourished.  Non-toxic appearance. He does not appear ill. No distress.  HENT:  Head: Normocephalic and atraumatic.  Right Ear: External ear normal.  Left Ear: External ear normal.  Nose: Nose normal. No mucosal edema or rhinorrhea.  Mouth/Throat: Oropharynx is clear and moist and mucous membranes are normal. No dental abscesses or uvula swelling.  Eyes: Pupils are equal, round, and reactive to light. Conjunctivae and EOM are normal.  Neck: Normal range of motion and full passive range of motion without pain. Neck supple.  Cardiovascular: Regular rhythm and normal heart sounds. Tachycardia present. Exam reveals no gallop and no friction rub.  No murmur  heard. Pulmonary/Chest: Effort normal and breath sounds normal. No respiratory distress. He has no wheezes. He has no rhonchi. He has no rales. He exhibits no tenderness and no crepitus.  Abdominal: Soft. Normal appearance and bowel sounds are normal. He exhibits no distension. There is no tenderness. There is no rebound and no guarding.  Musculoskeletal: Normal range of motion. He exhibits no edema or tenderness.  Moves all extremities well.   Neurological: He is alert and oriented to person, place, and time. He has normal strength. No cranial nerve deficit.  Skin: Skin is warm, dry and intact. No rash noted. No erythema. No pallor.  Psychiatric: He has a normal mood and affect. His speech is normal and behavior is normal. His mood appears not anxious.  Nursing note and vitals reviewed.    ED Treatments / Results  Labs (all labs ordered are listed, but only abnormal results are displayed) Results for orders placed or performed during the [facility] encounter of 01/17/18  CBC  Result Value Ref Range   WBC 20.8 (H) 4.0 - 10.5 K/uL   RBC 3.47 (L) 4.22 - 5.81 MIL/uL   Hemoglobin 8.7 (L) 13.0 - 17.0 g/dL   HCT 28.4 (L) 39.0 - 52.0 %   MCV 81.8 80.0 - 100.0 fL   MCH 25.1 (L) 26.0 - 34.0 pg   MCHC 30.6 30.0 - 36.0 g/dL   RDW 14.0 11.5 - 15.5 %   Platelets 649 (H) 150 - 400 K/uL   nRBC 0.0 0.0 - 0.2 %  Urinalysis, Routine w reflex microscopic  Result Value Ref Range   Color, Urine YELLOW YELLOW   APPearance CLOUDY (A) CLEAR   Specific Gravity, Urine 1.022 1.005 - 1.030   pH 5.0 5.0 - 8.0   Glucose, UA >=500 (A) NEGATIVE mg/dL   Hgb urine dipstick SMALL (A) NEGATIVE   Bilirubin Urine NEGATIVE NEGATIVE   Ketones, ur 5 (A) NEGATIVE mg/dL   Protein, ur 30 (A) NEGATIVE mg/dL   Nitrite POSITIVE (A) NEGATIVE   Leukocytes, UA LARGE (A) NEGATIVE   RBC / HPF 21-50 0 - 5 RBC/hpf   WBC, UA >50 (H) 0 - 5 WBC/hpf   Bacteria, UA RARE (A) NONE SEEN   Squamous Epithelial / LPF 0-5 0 - 5   WBC  Clumps PRESENT    Mucus PRESENT   Comprehensive metabolic panel  Result Value Ref Range   Sodium 127 (L) 135 - 145 mmol/L   Potassium 4.0 3.5 - 5.1 mmol/L   Chloride 96 (L) 98 - 111 mmol/L   CO2 19 (L) 22 - 32 mmol/L   Glucose, Bld 440 (H) 70 - 99 mg/dL   BUN 20 8 - 23 mg/dL   Creatinine, Ser 1.07 0.61 - 1.24 mg/dL   Calcium 7.9 (L) 8.9 - 10.3 mg/dL  Total Protein 7.2 6.5 - 8.1 g/dL   Albumin 2.5 (L) 3.5 - 5.0 g/dL   AST 19 15 - 41 U/L   ALT 15 0 - 44 U/L   Alkaline Phosphatase 152 (H) 38 - 126 U/L   Total Bilirubin 0.7 0.3 - 1.2 mg/dL   GFR calc non Af Amer >60 >60 mL/min   GFR calc Af Amer >60 >60 mL/min   Anion gap 12 5 - 15  Lactic acid, plasma  Result Value Ref Range   Lactic Acid, Venous 2.6 (HH) 0.5 - 1.9 mmol/L  CBG monitoring, ED  Result Value Ref Range   Glucose-Capillary 412 (H) 70 - 99 mg/dL  CBG monitoring, ED  Result Value Ref Range   Glucose-Capillary 390 (H) 70 - 99 mg/dL  CBG monitoring, ED  Result Value Ref Range   Glucose-Capillary 317 (H) 70 - 99 mg/dL   Laboratory interpretation all normal except leukocytosis, stable anemia, UTI, hyperglycemia with low bicarb and normal anion gap so probably pre-DKA, lactic acidosis   EKG EKG Interpretation  Date/Time:  Friday January 17 2018 02:10:21 EST Ventricular Rate:  134 PR Interval:    QRS Duration: 81 QT Interval:  292 QTC Calculation: 436 R Axis:   63 Text Interpretation:  Sinus tachycardia Repolarization abnormality, prob rate related Baseline wander in lead(s) II III aVL aVF V6 Since last tracing rate faster 03 Jan 2018 Confirmed by Rolland Porter [phone]) on 01/17/2018 2:32:09 AM   Radiology No results found.  Procedures .Critical Care Performed by: Rolland Porter, MD Authorized by: Rolland Porter, MD   Critical care provider statement:    Critical care time (minutes):  33   Critical care was necessary to treat or prevent imminent or life-threatening deterioration of the following conditions:   Circulatory failure and sepsis   Critical care was time spent personally by me on the following activities:  Discussions with consultants, examination of patient, obtaining history from patient or surrogate, ordering and review of laboratory studies, ordering and review of radiographic studies, re-evaluation of patient's condition and review of old charts   (including critical care time)  Medications Ordered in ED Medications  insulin regular, human (MYXREDLIN) 100 units/ 100 mL infusion (5.1 Units/hr Intravenous Rate/Dose Change 01/17/18 0419)  sodium chloride 0.9 % bolus 1,000 mL (0 mLs Intravenous Stopped 01/17/18 0419)    And  sodium chloride 0.9 % bolus 1,000 mL (0 mLs Intravenous Stopped 01/17/18 0419)    And  sodium chloride 0.9 % bolus 250 mL (0 mLs Intravenous Stopped 01/17/18 0322)  ceFEPIme (MAXIPIME) 2 g in sodium chloride 0.9 % 100 mL IVPB (0 g Intravenous Stopped 01/17/18 0400)  acetaminophen (TYLENOL) tablet 650 mg (650 mg Oral Given 01/17/18 0247)     Initial Impression / Assessment and Plan / ED Course  I have reviewed the triage vital signs and the nursing notes.  Pertinent labs & imaging results that were available during my care of the patient were reviewed by me and considered in my medical decision making (see chart for details).     Patient was started on sepsis protocol, his fever was treated with acetaminophen.  He was given IV fluid bolus 30 cc/kg.  He was started on insulin drip for CBG in the 400s.  We will see if he has DKA on his blood work.  He was started on healthcare associated urinary tract infection antibiotics because he is without symptoms and that is the most likely etiology as being the source of his  infection.  Patient's vital signs slowly improved, his heart rate stabilized in the 90s, he remained normotensive without hypotension.  After reviewing all his blood work I talked to the hospitalist about admission.  Patient CBG is improved into the 300  range.  4:37 AM Dr. Myna Hidalgo, hospitalist will admit.  Final Clinical Impressions(s) / ED Diagnoses   Final diagnoses:  Urinary tract infection with hematuria, site unspecified  Sepsis without acute organ dysfunction, due to unspecified organism [facility])  Hyperglycemia    Plan admission  Rolland Porter, MD, Barbette Or, MD 01/17/18 [phone]

## 2018-01-17 NOTE — ED Triage Notes (Signed)
EMS called out for patient shaking and high blood sugar, patient recently admitted for hyperglycemia and UTI.  Currently diaphoretic and shaking.

## 2018-01-17 NOTE — ED Notes (Signed)
CRITICAL VALUE ALERT  Critical Value:  Lactic Acid 2.6  Date & Time Notied:  01/17/18 & 0321 hrs  Provider Notified: Dr. Tomi Bamberger  Orders Received/Actions taken: N/A

## 2018-01-17 NOTE — Progress Notes (Signed)
2 IVF ordered for pt. Dr. Myna Hidalgo paged to see if he still wanted pt on IVF after insulin gtt discontinued.  Waiting for orders/call back.

## 2018-01-17 NOTE — Progress Notes (Signed)
New order for NS @ 85ml/hr STAT for 8 hours- 2 other IVF discontinued per Dr. Myna Hidalgo.

## 2018-01-17 NOTE — Progress Notes (Signed)
Pt is MRSA positive. Initiated contact precautions

## 2018-01-17 NOTE — H&P (Signed)
History and Physical    [facility] MCN:[id number] DOB: 1933-10-19 DOA: 01/17/2018  PCP: [name], MD   Patient coming from: Home   Chief Complaint: Shaking chills, sweating  HPI: [facility] is a 84 y.o. male with medical history significant for hypothyroidism, insulin-dependent diabetes mellitus, hyperlipidemia, and history of prostate cancer with chronic Foley catheter, now presenting to the emergency department for evaluation of rigors.  Patient reports that he was in his usual state of health until overnight when he began sweating and developed shaking chills.  He denies any recent cough, shortness of breath, chest pain, vomiting, or diarrhea.  Denies any rhinorrhea or sore throat.  He had been admitted to the hospital from 01/03/2018 until 01/07/2018 for sepsis secondary to UTI.  During the recent hospitalization, coag negative staph grew from one blood culture and urine culture grew multiple species.  His symptoms had resolved however after treatment with Rocephin  ED Course: Upon arrival to the ED, patient is found to be febrile to 39.9 C, tachycardic to 140, and with stable blood pressure.  EKG features sinus tachycardia with rate 134 and repolarization abnormality.  Chemistry panel is notable for glucose of 440 with sodium 127, bicarbonate 19, and anion gap 12.  CBC features a leukocytosis to 20,800 and thrombocytosis to 649,000.  He has a chronic normocytic anemia with hemoglobin now 8.7, down from 9.1 earlier this month.  Initial lactic acid is 2.6.  Urinalysis suggestive of infection.  Blood and urine cultures were collected, 30 cc/kg NS bolus given, and the patient was treated with cefepime and started on insulin infusion.  Heart rate is normalized, blood pressure remained stable, and the patient will be admitted for sepsis secondary to UTI.  Review of Systems:  All other systems reviewed and apart from HPI, are negative.  Past Medical History:  Diagnosis Date  .  Diabetes ([place])   . Left eye pain   . Prostate cancer [facility])     Past Surgical History:  Procedure Laterality Date  . HERNIA REPAIR    . PROSTATE SURGERY       reports that he has never smoked. He has never used smokeless tobacco. He reports that he does not drink alcohol or use drugs.  No Known Allergies  Family History  Problem Relation Age of Onset  . Pneumonia Father   . Cancer Sister      Prior to Admission medications   Medication Sig Start Date End Date Taking? Authorizing Provider  aspirin EC 81 MG tablet Take 81 mg by mouth daily.    [provider]  LEVEMIR FLEXTOUCH 100 UNIT/ML Pen Inject 30 Units into the skin daily. 01/07/18   Kathie Dike, MD  levothyroxine (SYNTHROID, LEVOTHROID) 25 MCG tablet Take 25 mcg by mouth daily before breakfast.    [provider]  pravastatin (PRAVACHOL) 10 MG tablet Take 10 mg by mouth daily.    [provider]    Physical Exam: Vitals:   01/17/18 0235 01/17/18 0300 01/17/18 0330 01/17/18 0400  BP:  127/70 120/66 122/65  Pulse:  (!) 114 (!) 106 (!) 103  Resp:  (!) 24 19 19   Temp: (!) 103.8 F (39.9 C)     TempSrc: Rectal     SpO2:  94% 98% 96%  Weight:      Height:        Constitutional: NAD, calm  Eyes: PERTLA, lids and conjunctivae normal ENMT: Mucous membranes are moist. Posterior pharynx clear of any exudate  or lesions.   Neck: normal, supple, no masses, no thyromegaly Respiratory: clear to auscultation bilaterally, no wheezing, no crackles. Normal respiratory effort.  .  Cardiovascular: S1 & S2 heard, regular rate and rhythm. No extremity edema.   Abdomen: No distension, no tenderness, soft. Bowel sounds normal.  Musculoskeletal: no clubbing / cyanosis. No joint deformity upper and lower extremities.    Skin: no significant rashes, lesions, ulcers. Warm, dry, well-perfused. Neurologic: CN 2-12 grossly intact. Sensation intact. Strength 5/5 in all 4 limbs.  Psychiatric:  Alert and oriented  x 3. Calm, cooperative.    Labs on Admission: I have personally reviewed following labs and imaging studies  CBC: Recent Labs  Lab 01/17/18 0237  WBC 20.8*  HGB 8.7*  HCT 28.4*  MCV 81.8  PLT 580*   Basic Metabolic Panel: Recent Labs  Lab 01/17/18 0237  NA 127*  K 4.0  CL 96*  CO2 19*  GLUCOSE 440*  BUN 20  CREATININE 1.07  CALCIUM 7.9*   GFR: Estimated Creatinine Clearance: 52.9 mL/min (by C-G formula based on SCr of 1.07 mg/dL). Liver Function Tests: Recent Labs  Lab 01/17/18 0237  AST 19  ALT 15  ALKPHOS 152*  BILITOT 0.7  PROT 7.2  ALBUMIN 2.5*   No results for input(s): LIPASE, AMYLASE in the last 168 hours. No results for input(s): AMMONIA in the last 168 hours. Coagulation Profile: No results for input(s): INR, PROTIME in the last 168 hours. Cardiac Enzymes: No results for input(s): CKTOTAL, CKMB, CKMBINDEX, TROPONINI in the last 168 hours. BNP (last 3 results) No results for input(s): PROBNP in the last 8760 hours. HbA1C: No results for input(s): HGBA1C in the last 72 hours. CBG: Recent Labs  Lab 01/17/18 0212 01/17/18 0308 01/17/18 0418  GLUCAP 412* 390* 317*   Lipid Profile: No results for input(s): CHOL, HDL, LDLCALC, TRIG, CHOLHDL, LDLDIRECT in the last 72 hours. Thyroid Function Tests: No results for input(s): TSH, T4TOTAL, FREET4, T3FREE, THYROIDAB in the last 72 hours. Anemia Panel: No results for input(s): VITAMINB12, FOLATE, FERRITIN, TIBC, IRON, RETICCTPCT in the last 72 hours. Urine analysis:    Component Value Date/Time   COLORURINE YELLOW 01/17/2018 0237   APPEARANCEUR CLOUDY (A) 01/17/2018 0237   LABSPEC 1.022 01/17/2018 0237   PHURINE 5.0 01/17/2018 0237   GLUCOSEU >=500 (A) 01/17/2018 0237   HGBUR SMALL (A) 01/17/2018 0237   BILIRUBINUR NEGATIVE 01/17/2018 0237   KETONESUR 5 (A) 01/17/2018 0237   PROTEINUR 30 (A) 01/17/2018 0237   UROBILINOGEN 0.2 09/03/2013 0132   NITRITE POSITIVE (A) 01/17/2018 0237    LEUKOCYTESUR LARGE (A) 01/17/2018 0237   Sepsis Labs: @LABRCNTIP (procalcitonin:4,lacticidven:4) )No results found for this or any previous visit (from the past 240 hour(s)).   Radiological Exams on Admission: No results found.  EKG: Independently reviewed. Sinus tachycardia (rate 134), repolarization abnormality.   Assessment/Plan   1. Sepsis secondary to UTI  - Presents with sweating and rigors, found to be febrile with tachycardia, leukocytosis, elevated lactate, and UA suggestive of recurrent UTI  - Blood and urine cultures collected in ED, 30 cc/kg NS bolus given, and he was started on cefepime  - HR has normalized with IVF, BP remains stable  - Continue cefepime while following cultures and clinical course    2. DKA; insulin-dependent DM  - A1c was 9.6% earlier this month, managed at home with Levemir 30 units qHS  - Serum glucose is 440 in ED with metabolic acidosis  - Started on insulin infusion in ED,  will continue with frequent CBG's and serial chem panels    3. Hypothyroidism  - Continue Synthroid    4. Anemia  - Hgb is 8.7 on admission, slightly lower than earlier this month  - Denies melena or hematochezia, likely secondary to chronic disease    DVT prophylaxis: Lovenox  Code Status: Full  Family Communication: Discussed with patient  Consults called: None Admission status: Inpatient     Vianne Bulls, MD [facility] Pager [phone]  If 7PM-7AM, please contact night-coverage [email or web address] Password [facility]  01/17/2018, 4:42 AM

## 2018-01-17 NOTE — Progress Notes (Signed)
Pharmacy Antibiotic Note  [facility] is a 84 y.o. male admitted on 01/17/2018 with UTI.  Pharmacy has been consulted for Cefepime dosing.  Plan: Cefepime 1000 mg IV every 12 hours.  Monitor labs, c/s, and patient improvement.   Height: 5\' 8"  (172.7 cm) Weight: 147 lb 14.9 oz (67.1 kg) IBW/kg (Calculated) : 68.4  Temp (24hrs), Avg:100.2 F (37.9 C), Min:97.5 F (36.4 C), Max:103.8 F (39.9 C)  Recent Labs  Lab 01/17/18 0237 01/17/18 0433 01/17/18 0443  WBC 20.8*  --   --   CREATININE 1.07  --  0.97  LATICACIDVEN 2.6* 1.3  --     Estimated Creatinine Clearance: 55.7 mL/min (by C-G formula based on SCr of 0.97 mg/dL).    No Known Allergies  Antimicrobials this admission: Cefepime 11/29 >>      Dose adjustments this admission: N/A  Microbiology results: 11/29 BCx: pending 11/29 UCx: pending  11/29 MRSA PCR: pending  Thank you for allowing pharmacy to be a part of this patient's care.  Ramond Craver 01/17/2018 7:42 AM

## 2018-01-17 NOTE — Progress Notes (Signed)
Insulin Levemir given at [phone]. Insulin drip to be stopped at [phone]

## 2018-01-18 LAB — BASIC METABOLIC PANEL
Anion gap: 9 (ref 5–15)
BUN: 13 mg/dL (ref 8–23)
CO2: 22 mmol/L (ref 22–32)
Calcium: 8.1 mg/dL — ABNORMAL LOW (ref 8.9–10.3)
Chloride: 104 mmol/L (ref 98–111)
Creatinine, Ser: 0.76 mg/dL (ref 0.61–1.24)
GFR calc Af Amer: >60 mL/min (ref >60)
GFR calc non Af Amer: >60 mL/min (ref >60)
Glucose, Bld: 104 mg/dL — ABNORMAL HIGH (ref 70–99)
Potassium: 3.7 mmol/L (ref 3.5–5.1)
SODIUM: 135 mmol/L (ref 135–145)

## 2018-01-18 LAB — GLUCOSE, CAPILLARY
GLUCOSE-CAPILLARY: 64 mg/dL — AB (ref 70–99)
GLUCOSE-CAPILLARY: 186 mg/dL — AB (ref 70–99)
Glucose-Capillary: 192 mg/dL — ABNORMAL HIGH (ref 70–99)
Glucose-Capillary: 182 mg/dL — ABNORMAL HIGH (ref 70–99)

## 2018-01-18 LAB — CBC
HCT: 29.1 % — ABNORMAL LOW (ref 39.0–52.0)
Hemoglobin: 8.7 g/dL — ABNORMAL LOW (ref 13.0–17.0)
MCH: 24.9 pg — ABNORMAL LOW (ref 26.0–34.0)
MCHC: 29.9 g/dL — ABNORMAL LOW (ref 30.0–36.0)
MCV: 83.4 fL (ref 80.0–100.0)
PLATELETS: 681 10*3/uL — AB (ref 150–400)
RBC: 3.49 MIL/uL — ABNORMAL LOW (ref 4.22–5.81)
RDW: 14 % (ref 11.5–15.5)
WBC: 12.6 10*3/uL — AB (ref 4.0–10.5)
nRBC: 0 % (ref 0.0–0.2)

## 2018-01-18 LAB — URINE CULTURE: Special Requests: NORMAL

## 2018-01-18 MED ORDER — ONDANSETRON HCL 4 MG/2ML IJ SOLN
4.0000 mg | Freq: Four times a day (QID) | INTRAMUSCULAR | Status: DC | PRN
  Administered 2018-01-18: 4 mg via INTRAVENOUS
  Filled 2018-01-18: qty 2

## 2018-01-18 MED ORDER — ACETAMINOPHEN 325 MG PO TABS
650.0000 mg | ORAL_TABLET | Freq: Four times a day (QID) | ORAL | Status: DC | PRN
  Administered 2018-01-18 – 2018-01-25 (×8): 650 mg via ORAL
  Filled 2018-01-18 (×6): qty 2

## 2018-01-18 MED ORDER — LORAZEPAM 1 MG PO TABS
1.0000 mg | ORAL_TABLET | Freq: Once | ORAL | Status: AC
  Administered 2018-01-18: 1 mg via ORAL
  Filled 2018-01-18: qty 1

## 2018-01-18 MED ORDER — SODIUM CHLORIDE 0.9 % IV SOLN
INTRAVENOUS | Status: AC
  Administered 2018-01-18 (×2): via INTRAVENOUS

## 2018-01-18 NOTE — Progress Notes (Signed)
PROGRESS NOTE    Anthony Dunlap  KGM:[id number] DOB: August 04, 1933 DOA: 01/17/2018 PCP: [name], MD     Brief Narrative:  84 y.o. male with medical history significant for hypothyroidism, insulin-dependent diabetes mellitus, hyperlipidemia, and history of prostate cancer with chronic Foley catheter, now presenting to the emergency department for evaluation of rigors.  Patient reports that he was in his usual state of health until overnight when he began sweating and developed shaking chills.  He denies any recent cough, shortness of breath, chest pain, vomiting, or diarrhea. Patient was admitted for sepsis due to UTI.   Assessment & Plan: 1-Sepsis secondary to UTI ([place]) -still spiking fever and feeling nauseated -culture results pending -continue IVF's and current IV antibiotics  -PRN antiemetics and antipyretics -follow clinical response -continue supportive care  2-mild DKA; type 2 diabetes with hyperglycemia: insulin dependent  -DKA resolved -off insulin drip -continue SSI and Lantus -advise not to skip meals and to maintain adequate hydration -Glucerna added to provide feeding supplements  -recent A1C 9.3  3-Hypothyroidism -continue synthroid   4-lactic acidosis -in the setting of sepsis and DKA -now resolved -continue IVF's -patient encouraged to maintain adequate hydration  5-HLD -continue statins  6-Normocytic anemia -most likely in the setting of anemia of chronic disease -will follow Hgb trend -no signs of overt bleeding.  DVT prophylaxis: lovenox Code Status: Full Family Communication: no family at bedside  Disposition Plan: stable to be moved to med-surg, continue IV antibiotics, PRN antiemetics and antipyretics. Continue gentle fluid resuscitation and follow urine culture results.   Consultants:   None   Procedures:   See below for x-ray reports   Antimicrobials:  Anti-infectives (From admission, onward)   Start     Dose/Rate Route  Frequency Ordered Stop   01/17/18 1200  ceFEPIme (MAXIPIME) 1 g in sodium chloride 0.9 % 100 mL IVPB     1 g 200 mL/hr over 30 Minutes Intravenous Every 12 hours 01/17/18 0741     01/17/18 0230  ceFEPIme (MAXIPIME) 2 g in sodium chloride 0.9 % 100 mL IVPB     2 g 200 mL/hr over 30 Minutes Intravenous  Once 01/17/18 0224 01/17/18 0400      Subjective: Febrile (TMAX 102.7); denies CP and SOB. Patient reports nausea and decrease appetite.  Objective: Vitals:   01/18/18 0400 01/18/18 0500 01/18/18 0600 01/18/18 0748  BP: (!) 155/76 (!) 155/89 (!) 143/78   Pulse: (!) 115 (!) 114 (!) 109   Resp: (!) 22 (!) 21 (!) 27   Temp:  100 F (37.8 C)  (!) 102.7 F (39.3 C)  TempSrc:  Axillary  Oral  SpO2: 97% 97% 97%   Weight:      Height:        Intake/Output Summary (Last 24 hours) at 01/18/2018 0850 Last data filed at 01/18/2018 0534 Gross per 24 hour  Intake 1901.85 ml  Output 1850 ml  Net 51.85 ml   Filed Weights   01/17/18 0209 01/17/18 0600  Weight: 70.3 kg 67.1 kg    Examination: General exam: Alert, awake, oriented x 3; still spiking fever and with decrease appetite. Reports feeling nauseated.  Respiratory system: Clear to auscultation. Respiratory effort normal. Cardiovascular system: mild sinus tachycardia. No murmurs, rubs, gallops; no JVD Gastrointestinal system: Abdomen is nondistended, soft and nontender. No organomegaly or masses felt. Normal bowel sounds heard. Central nervous system: Alert and oriented. No focal neurological deficits. Extremities: No C/C/E, +pedal pulses Skin: No rashes, no petechiae, no open ulcers  Psychiatry: Judgement and insight appear normal. Mood & affect appropriate.     Data Reviewed: I have personally reviewed following labs and imaging studies  CBC: Recent Labs  Lab 01/17/18 0237 01/18/18 0417  WBC 20.8* 12.6*  HGB 8.7* 8.7*  HCT 28.4* 29.1*  MCV 81.8 83.4  PLT 649* 875*   Basic Metabolic Panel: Recent Labs  Lab  01/17/18 0237 01/17/18 0443 01/17/18 0744 01/18/18 0417  NA 127* 131* 138 135  K 4.0 3.6 3.9 3.7  CL 96* 104 108 104  CO2 19* 20* 21* 22  GLUCOSE 440* 347* 129* 104*  BUN 20 19 17 13   CREATININE 1.07 0.97 0.82 0.76  CALCIUM 7.9* 7.2* 8.2* 8.1*   GFR: Estimated Creatinine Clearance: 67.6 mL/min (by C-G formula based on SCr of 0.76 mg/dL).   Liver Function Tests: Recent Labs  Lab 01/17/18 0237  AST 19  ALT 15  ALKPHOS 152*  BILITOT 0.7  PROT 7.2  ALBUMIN 2.5*   Coagulation Profile: Recent Labs  Lab 01/17/18 0443  INR 1.31   CBG: Recent Labs  Lab 01/17/18 0749 01/17/18 1026 01/17/18 1607 01/17/18 2213 01/18/18 0746  GLUCAP 116* 97 128* 194* 64*   Urine analysis:    Component Value Date/Time   COLORURINE YELLOW 01/17/2018 0237   APPEARANCEUR CLOUDY (A) 01/17/2018 0237   LABSPEC 1.022 01/17/2018 0237   PHURINE 5.0 01/17/2018 0237   GLUCOSEU >=500 (A) 01/17/2018 0237   HGBUR SMALL (A) 01/17/2018 0237   BILIRUBINUR NEGATIVE 01/17/2018 0237   KETONESUR 5 (A) 01/17/2018 0237   PROTEINUR 30 (A) 01/17/2018 0237   UROBILINOGEN 0.2 09/03/2013 0132   NITRITE POSITIVE (A) 01/17/2018 0237   LEUKOCYTESUR LARGE (A) 01/17/2018 0237    Recent Results (from the past 240 hour(s))  Blood Culture (routine x 2)     Status: None (Preliminary result)   Collection Time: 01/17/18  2:40 AM  Result Value Ref Range Status   Specimen Description BLOOD RIGHT FOREARM  Final   Special Requests   Final    BOTTLES DRAWN AEROBIC AND ANAEROBIC Blood Culture results may not be optimal due to an excessive volume of blood received in culture bottles   Culture   Final    NO GROWTH 1 DAY Performed at [facility], [address] [place], [place] [address]    Report Status PENDING  Incomplete  Blood Culture (routine x 2)     Status: None (Preliminary result)   Collection Time: 01/17/18  2:59 AM  Result Value Ref Range Status   Specimen Description BLOOD LEFT ANTECUBITAL  Final    Special Requests   Final    BOTTLES DRAWN AEROBIC AND ANAEROBIC Blood Culture adequate volume   Culture   Final    NO GROWTH 1 DAY Performed at [facility], [address] [place], [place] [address]    Report Status PENDING  Incomplete  MRSA PCR Screening     Status: Abnormal   Collection Time: 01/17/18  5:56 AM  Result Value Ref Range Status   MRSA by PCR POSITIVE (A) NEGATIVE Final    Comment:        The GeneXpert MRSA Assay (FDA approved for NASAL specimens only), is one component of a comprehensive MRSA colonization surveillance program. It is not intended to diagnose MRSA infection nor to guide or monitor treatment for MRSA infections. RESULT CALLED TO, READ BACK BY AND VERIFIED WITH: EVANS,H @ 4166 ON 01/17/18 BY JUW Performed at [facility], [address] [place],  [place] [address]     Radiology Studies: No results found.   Scheduled Meds: . aspirin EC  81 mg Oral Daily  . enoxaparin (LOVENOX) injection  40 mg Subcutaneous Q24H  . feeding supplement (GLUCERNA SHAKE)  237 mL Oral BID BM  . insulin aspart  0-9 Units Subcutaneous TID WC  . insulin detemir  20 Units Subcutaneous QHS  . levothyroxine  25 mcg Oral Q0600  . pravastatin  10 mg Oral q1800   Continuous Infusions: . ceFEPime (MAXIPIME) IV Stopped (01/17/18 2218)     LOS: 1 day    Time spent: 30 minutes    Barton Dubois, MD [facility] Pager [phone]  If 7PM-7AM, please contact night-coverage [email or web address] Password [email or web address] 01/18/2018, 8:50 AM

## 2018-01-18 NOTE — Progress Notes (Signed)
verbally gave report to Dr. Dyann Kief of gram + cocci.

## 2018-01-19 LAB — BLOOD CULTURE ID PANEL (REFLEXED)
Acinetobacter baumannii: NOT DETECTED
Candida albicans: NOT DETECTED
Candida glabrata: NOT DETECTED
Candida krusei: NOT DETECTED
Candida parapsilosis: NOT DETECTED
Candida tropicalis: NOT DETECTED
Enterobacter cloacae complex: NOT DETECTED
Enterobacteriaceae species: NOT DETECTED
Enterococcus species: NOT DETECTED
Escherichia coli: NOT DETECTED
Haemophilus influenzae: NOT DETECTED
Klebsiella oxytoca: NOT DETECTED
Klebsiella pneumoniae: NOT DETECTED
Listeria monocytogenes: NOT DETECTED
NEISSERIA MENINGITIDIS: NOT DETECTED
PROTEUS SPECIES: NOT DETECTED
Pseudomonas aeruginosa: NOT DETECTED
STAPHYLOCOCCUS AUREUS BCID: NOT DETECTED
STAPHYLOCOCCUS SPECIES: NOT DETECTED
Serratia marcescens: NOT DETECTED
Streptococcus agalactiae: NOT DETECTED
Streptococcus pneumoniae: NOT DETECTED
Streptococcus pyogenes: NOT DETECTED
Streptococcus species: NOT DETECTED

## 2018-01-19 LAB — GLUCOSE, CAPILLARY
GLUCOSE-CAPILLARY: 101 mg/dL — AB (ref 70–99)
GLUCOSE-CAPILLARY: 150 mg/dL — AB (ref 70–99)
Glucose-Capillary: 51 mg/dL — ABNORMAL LOW (ref 70–99)
Glucose-Capillary: 241 mg/dL — ABNORMAL HIGH (ref 70–99)
Glucose-Capillary: 160 mg/dL — ABNORMAL HIGH (ref 70–99)

## 2018-01-19 MED ORDER — IBUPROFEN 400 MG PO TABS
400.0000 mg | ORAL_TABLET | Freq: Two times a day (BID) | ORAL | Status: DC | PRN
  Filled 2018-01-19 (×2): qty 1

## 2018-01-19 MED ORDER — SODIUM CHLORIDE 0.9 % IV SOLN
INTRAVENOUS | Status: AC
  Administered 2018-01-19 – 2018-01-20 (×2): via INTRAVENOUS

## 2018-01-19 MED ORDER — IBUPROFEN 600 MG PO TABS: 600.0000 mg | ORAL_TABLET | Freq: Two times a day (BID) | ORAL | Status: DC | PRN

## 2018-01-19 MED ORDER — CEFAZOLIN SODIUM-DEXTROSE 1-4 GM/50ML-% IV SOLN
INTRAVENOUS | Status: AC
  Filled 2018-01-19: qty 100

## 2018-01-19 MED ORDER — CEFAZOLIN SODIUM-DEXTROSE 1-4 GM/50ML-% IV SOLN
1.0000 g | Freq: Three times a day (TID) | INTRAVENOUS | Status: DC
  Administered 2018-01-19 – 2018-01-21 (×6): 1 g via INTRAVENOUS
  Filled 2018-01-19 (×10): qty 50

## 2018-01-19 MED ORDER — INSULIN DETEMIR 100 UNIT/ML ~~LOC~~ SOLN
17.0000 [IU] | Freq: Every day | SUBCUTANEOUS | Status: DC
  Administered 2018-01-19 – 2018-01-26 (×8): 17 [IU] via SUBCUTANEOUS
  Filled 2018-01-19 (×9): qty 0.17

## 2018-01-19 NOTE — Progress Notes (Signed)
Hypoglycemic Event  CBG: 51  Treatment: 15 GM carbohydrate snack  Symptoms: None  Follow-up CBG: [id number] CBG Result:101  Possible Reasons for Event: Inadequate meal intake and Medication regimen: receiving lantus at bedtime  Comments/MD notified:Dr. Dyann Kief notified verbally - patient with no symptoms, given juice and breakfast came, sugar increased appropriately    Marcell Anger, Marveen Reeks

## 2018-01-19 NOTE — Progress Notes (Signed)
PROGRESS NOTE    Anthony Dunlap  UMP:[id number] DOB: 1933-04-26 DOA: 01/17/2018 PCP: [name], MD    Brief Narrative:  84 y.o. male with medical history significant for hypothyroidism, insulin-dependent diabetes mellitus, hyperlipidemia, and history of prostate cancer with chronic Foley catheter, now presenting to the emergency department for evaluation of rigors.  Patient reports that he was in his usual state of health until overnight when he began sweating and developed shaking chills.  He denies any recent cough, shortness of breath, chest pain, vomiting, or diarrhea. Patient was admitted for sepsis due to UTI.   Assessment & Plan: 1-Sepsis secondary to UTI ([place]) -still spiking fever and feeling nauseated -culture results: Demonstrating multiple microorganism and recollection was recommended -We will repeat urine cultures -Blood culture 1 out of 2 positive for CNS  -Repeat blood culture -continue IVF's and current IV antibiotics; but antibiotics will be changed to cefazolin IV. -Continue PRN antiemetics and antipyretics -Continue supportive care and follow clinical response  2-mild DKA; type 2 diabetes with hyperglycemia: insulin dependent  -DKA resolved -off insulin drip and due to decreased appetite and marginal oral intake had experienced couple episodes of hypoglycemia. -Will adjust Lantus dose -Continue sliding scale insulin and the use of Glucerna -Patient has been advised not to skip any meals. -continue SSI and Lantus -advise not to skip meals  -recent A1C 9.3  3-Hypothyroidism -continue synthroid   4-lactic acidosis -in the setting of sepsis and DKA -now resolved after IV fluids resuscitation. -Encouraged to maintain adequate hydration.  5-HLD -continue statins  6-Normocytic anemia -most likely in the setting of anemia of chronic disease -will follow Hgb trend -no signs of overt bleeding. -Repeat CBC in a.m.  DVT prophylaxis: lovenox Code Status:  Full Family Communication: no family at bedside  Disposition Plan: Still spiking fever, 1/2 blood culture +.  Narrow IV antibiotic to cefazolin; repeat blood cultures and urine cultures.  Continue PRN antiemetics and antipyretics.   Consultants:   None   Procedures:   See below for x-ray reports   Antimicrobials:  Anti-infectives (From admission, onward)   Start     Dose/Rate Route Frequency Ordered Stop   01/19/18 1400  ceFAZolin (ANCEF) IVPB 1 g/50 mL premix     1 g 100 mL/hr over 30 Minutes Intravenous Every 8 hours 01/19/18 1104     01/17/18 1200  ceFEPIme (MAXIPIME) 1 g in sodium chloride 0.9 % 100 mL IVPB  Status:  Discontinued     1 g 200 mL/hr over 30 Minutes Intravenous Every 12 hours 01/17/18 0741 01/19/18 1104   01/17/18 0230  ceFEPIme (MAXIPIME) 2 g in sodium chloride 0.9 % 100 mL IVPB     2 g 200 mL/hr over 30 Minutes Intravenous  Once 01/17/18 0224 01/17/18 0400      Subjective: Patient has spiked fever again (T-max 102.8).  Reports feeling weak and having poor appetite.  No chest pain or shortness of breath.  No nausea, no vomiting.  Objective: Vitals:   01/18/18 2336 01/18/18 2355 01/19/18 0337 01/19/18 0901  BP: (!) 163/93  (!) 150/86 114/63  Pulse: (!) 116 (!) 128 (!) 118 (!) 109  Resp: (!) 21  20 16   Temp: 98.5 F (36.9 C)  (!) 102.8 F (39.3 C) 98 F (36.7 C)  TempSrc: Oral  Oral Oral  SpO2: 98% 98% 96% 95%  Weight:      Height:        Intake/Output Summary (Last 24 hours) at 01/19/2018 1107 Last data  filed at 01/19/2018 1000 Gross per 24 hour  Intake 3105.29 ml  Output 2625 ml  Net 480.29 ml   Filed Weights   01/17/18 0209 01/17/18 0600  Weight: 70.3 kg 67.1 kg    Examination: General exam: Alert, awake, oriented x 3; in no major distress.  Still spiking fever and no feeling back to his baseline.  No nausea no vomiting.  Marginal oral intake. Respiratory system: Clear to auscultation. Respiratory effort normal. Cardiovascular system:  Sinus tachycardia. No murmurs, rubs, gallops. Gastrointestinal system: Abdomen is nondistended, soft and nontender. No organomegaly or masses felt. Normal bowel sounds heard. Central nervous system: Alert and oriented. No focal neurological deficits. Extremities: No C/C/E, +pedal pulses Skin: No rashes, lesions or ulcers Psychiatry: Judgement and insight appear normal. Mood & affect appropriate.    Data Reviewed: I have personally reviewed following labs and imaging studies  CBC: Recent Labs  Lab 01/17/18 0237 01/18/18 0417  WBC 20.8* 12.6*  HGB 8.7* 8.7*  HCT 28.4* 29.1*  MCV 81.8 83.4  PLT 649* 568*   Basic Metabolic Panel: Recent Labs  Lab 01/17/18 0237 01/17/18 0443 01/17/18 0744 01/18/18 0417  NA 127* 131* 138 135  K 4.0 3.6 3.9 3.7  CL 96* 104 108 104  CO2 19* 20* 21* 22  GLUCOSE 440* 347* 129* 104*  BUN 20 19 17 13   CREATININE 1.07 0.97 0.82 0.76  CALCIUM 7.9* 7.2* 8.2* 8.1*   GFR: Estimated Creatinine Clearance: 67.6 mL/min (by C-G formula based on SCr of 0.76 mg/dL).   Liver Function Tests: Recent Labs  Lab 01/17/18 0237  AST 19  ALT 15  ALKPHOS 152*  BILITOT 0.7  PROT 7.2  ALBUMIN 2.5*   Coagulation Profile: Recent Labs  Lab 01/17/18 0443  INR 1.31   CBG: Recent Labs  Lab 01/18/18 1144 01/18/18 1649 01/18/18 2106 01/19/18 0745 01/19/18 0841  GLUCAP 192* 186* 182* 51* 101*   Urine analysis:    Component Value Date/Time   COLORURINE YELLOW 01/17/2018 0237   APPEARANCEUR CLOUDY (A) 01/17/2018 0237   LABSPEC 1.022 01/17/2018 0237   PHURINE 5.0 01/17/2018 0237   GLUCOSEU >=500 (A) 01/17/2018 0237   HGBUR SMALL (A) 01/17/2018 0237   BILIRUBINUR NEGATIVE 01/17/2018 0237   KETONESUR 5 (A) 01/17/2018 0237   PROTEINUR 30 (A) 01/17/2018 0237   UROBILINOGEN 0.2 09/03/2013 0132   NITRITE POSITIVE (A) 01/17/2018 0237   LEUKOCYTESUR LARGE (A) 01/17/2018 0237    Recent Results (from the past 240 hour(s))  Urine culture     Status:  Abnormal   Collection Time: 01/17/18  2:23 AM  Result Value Ref Range Status   Specimen Description   Final    URINE, CATHETERIZED Performed at [facility], [address], [place], [place] [address]    Special Requests   Final    Normal Performed at [facility], [address] [place], [place] [address]    [place], SUGGEST RECOLLECTION (A)  Final   Report Status 01/18/2018 FINAL  Final  Blood Culture (routine x 2)     Status: None (Preliminary result)   Collection Time: 01/17/18  2:40 AM  Result Value Ref Range Status   Specimen Description   Final    BLOOD RIGHT FOREARM Performed at [facility], [address], [place],  [address]    Special Requests   Final    BOTTLES DRAWN AEROBIC AND ANAEROBIC Blood Culture results may not be optimal due to an excessive volume of blood received  in culture bottles Performed at [facility], [address], [place], [place] [address]    Culture  Setup Time   Final    GRAM POSITIVE COCCI ANAEROBIC BOTTLE Gram Stain Report Called to,Read Back By and Verified With: HYATT,L@1344  BY MATTHEWS,B 11.30.19 [facility] Organism ID to follow CRITICAL RESULT CALLED TO, READ BACK BY AND VERIFIED WITH: Cletis Media RN [id number] BY GF    Culture   Final    CULTURE REINCUBATED FOR BETTER GROWTH Performed at [facility], [place] [address] [place], [place] [address]    Report Status PENDING  Incomplete  Blood Culture ID Panel (Reflexed)     Status: None   Collection Time: 01/17/18  2:40 AM  Result Value Ref Range Status   Enterococcus species NOT DETECTED NOT DETECTED Final   Listeria monocytogenes NOT DETECTED NOT DETECTED Final   Staphylococcus species NOT DETECTED NOT DETECTED Final   Staphylococcus aureus (BCID) NOT DETECTED NOT DETECTED Final   Streptococcus species NOT DETECTED NOT DETECTED Final   Streptococcus agalactiae NOT DETECTED NOT DETECTED Final   Streptococcus pneumoniae NOT DETECTED NOT DETECTED  Final   Streptococcus pyogenes NOT DETECTED NOT DETECTED Final   Acinetobacter baumannii NOT DETECTED NOT DETECTED Final   Enterobacteriaceae species NOT DETECTED NOT DETECTED Final   Enterobacter cloacae complex NOT DETECTED NOT DETECTED Final   Escherichia coli NOT DETECTED NOT DETECTED Final   Klebsiella oxytoca NOT DETECTED NOT DETECTED Final   Klebsiella pneumoniae NOT DETECTED NOT DETECTED Final   Proteus species NOT DETECTED NOT DETECTED Final   Serratia marcescens NOT DETECTED NOT DETECTED Final   Haemophilus influenzae NOT DETECTED NOT DETECTED Final   Neisseria meningitidis NOT DETECTED NOT DETECTED Final   Pseudomonas aeruginosa NOT DETECTED NOT DETECTED Final   Candida albicans NOT DETECTED NOT DETECTED Final   Candida glabrata NOT DETECTED NOT DETECTED Final   Candida krusei NOT DETECTED NOT DETECTED Final   Candida parapsilosis NOT DETECTED NOT DETECTED Final   Candida tropicalis NOT DETECTED NOT DETECTED Final    Comment: Performed at [facility], [place] [address] [place], [place] [address]  Blood Culture (routine x 2)     Status: None (Preliminary result)   Collection Time: 01/17/18  2:59 AM  Result Value Ref Range Status   Specimen Description   Final    BLOOD LEFT ANTECUBITAL Performed at [facility], [address] [place], [place] [address]    Special Requests   Final    BOTTLES DRAWN AEROBIC AND ANAEROBIC Blood Culture adequate volume Performed at [facility], [address] [place], [place] [address]    Culture  Setup Time   Final    GRAM POSITIVE COCCI Gram Stain Report Called to,Read Back By and Verified With: MOTLEY @ 8099 ON [id number] BY HENDERSON L. Performed at [facility], [address], [place], [place] [address]    Culture   Final    CULTURE REINCUBATED FOR BETTER GROWTH Performed at [facility], [place] [address], [place], [place] [address]    Report Status PENDING  Incomplete  MRSA PCR Screening     Status: Abnormal   Collection  Time: 01/17/18  5:56 AM  Result Value Ref Range Status   MRSA by PCR POSITIVE (A) NEGATIVE Final    Comment:        The GeneXpert MRSA Assay (FDA approved for NASAL specimens only), is one component of a comprehensive MRSA colonization surveillance program. It is not  intended to diagnose MRSA infection nor to guide or monitor treatment for MRSA infections. RESULT CALLED TO, READ BACK BY AND VERIFIED WITH: EVANS,H @ 9450 ON 01/17/18 BY JUW Performed at [facility], [address], [place], [place] [address]     Radiology Studies: No results found.   Scheduled Meds: . aspirin EC  81 mg Oral Daily  . enoxaparin (LOVENOX) injection  40 mg Subcutaneous Q24H  . feeding supplement (GLUCERNA SHAKE)  237 mL Oral BID BM  . insulin aspart  0-9 Units Subcutaneous TID WC  . insulin detemir  17 Units Subcutaneous QHS  . levothyroxine  25 mcg Oral Q0600  . pravastatin  10 mg Oral q1800   Continuous Infusions: . sodium chloride    .  ceFAZolin (ANCEF) IV       LOS: 2 days    Time spent: 30 minutes    Barton Dubois, MD [facility] Pager [phone]  If 7PM-7AM, please contact night-coverage [email or web address] Password [email or web address] 01/19/2018, 11:07 AM

## 2018-01-19 NOTE — Progress Notes (Addendum)
  PHARMACY - PHYSICIAN COMMUNICATION CRITICAL VALUE ALERT - BLOOD CULTURE IDENTIFICATION (BCID)  Anthony Dunlap is an 84 y.o. male who presented to [facility] on 01/17/2018 with a chief complaint of sepsis due to UTI.  Assessment:  Patient still spiking fevers despite 3 days of cefepime therapy.  Name of physician (or Provider) Contacted: Dr. Dyann Kief  Current antibiotics:  Cefepime 1g IV q12h  Changes to prescribed antibiotics recommended:  Cefazolin per ID treatment algorithm Recommendations accepted by provider.   Results for orders placed or performed during the hospital encounter of 01/17/18  Blood Culture ID Panel (Reflexed) (Collected: 01/17/2018  2:40 AM)  Result Value Ref Range   Enterococcus species NOT DETECTED NOT DETECTED   Listeria monocytogenes NOT DETECTED NOT DETECTED   Staphylococcus species NOT DETECTED NOT DETECTED   Staphylococcus aureus (BCID) NOT DETECTED NOT DETECTED   Streptococcus species NOT DETECTED NOT DETECTED   Streptococcus agalactiae NOT DETECTED NOT DETECTED   Streptococcus pneumoniae NOT DETECTED NOT DETECTED   Streptococcus pyogenes NOT DETECTED NOT DETECTED   Acinetobacter baumannii NOT DETECTED NOT DETECTED   Enterobacteriaceae species NOT DETECTED NOT DETECTED   Enterobacter cloacae complex NOT DETECTED NOT DETECTED   Escherichia coli NOT DETECTED NOT DETECTED   Klebsiella oxytoca NOT DETECTED NOT DETECTED   Klebsiella pneumoniae NOT DETECTED NOT DETECTED   Proteus species NOT DETECTED NOT DETECTED   Serratia marcescens NOT DETECTED NOT DETECTED   Haemophilus influenzae NOT DETECTED NOT DETECTED   Neisseria meningitidis NOT DETECTED NOT DETECTED   Pseudomonas aeruginosa NOT DETECTED NOT DETECTED   Candida albicans NOT DETECTED NOT DETECTED   Candida glabrata NOT DETECTED NOT DETECTED   Candida krusei NOT DETECTED NOT DETECTED   Candida parapsilosis NOT DETECTED NOT DETECTED   Candida tropicalis NOT DETECTED NOT DETECTED    Despina Pole 01/19/2018  9:26 AM

## 2018-01-19 NOTE — Progress Notes (Signed)
Patient with fever of 101.9.  Dr Dyann Kief made aware, will continue to monitor

## 2018-01-20 LAB — BASIC METABOLIC PANEL
Anion gap: 7 (ref 5–15)
BUN: 13 mg/dL (ref 8–23)
CO2: 25 mmol/L (ref 22–32)
CREATININE: 0.77 mg/dL (ref 0.61–1.24)
Calcium: 7.8 mg/dL — ABNORMAL LOW (ref 8.9–10.3)
Chloride: 100 mmol/L (ref 98–111)
GFR calc Af Amer: >60 mL/min (ref >60)
GFR calc non Af Amer: >60 mL/min (ref >60)
Glucose, Bld: 92 mg/dL (ref 70–99)
Potassium: 3.5 mmol/L (ref 3.5–5.1)
Sodium: 132 mmol/L — ABNORMAL LOW (ref 135–145)

## 2018-01-20 LAB — GLUCOSE, CAPILLARY
Glucose-Capillary: 66 mg/dL — ABNORMAL LOW (ref 70–99)
Glucose-Capillary: 80 mg/dL (ref 70–99)
Glucose-Capillary: 233 mg/dL — ABNORMAL HIGH (ref 70–99)
Glucose-Capillary: 140 mg/dL — ABNORMAL HIGH (ref 70–99)
Glucose-Capillary: 225 mg/dL — ABNORMAL HIGH (ref 70–99)

## 2018-01-20 LAB — CBC
HCT: 26.2 % — ABNORMAL LOW (ref 39.0–52.0)
Hemoglobin: 8 g/dL — ABNORMAL LOW (ref 13.0–17.0)
MCH: 24.8 pg — ABNORMAL LOW (ref 26.0–34.0)
MCHC: 30.5 g/dL (ref 30.0–36.0)
MCV: 81.4 fL (ref 80.0–100.0)
Platelets: 549 10*3/uL — ABNORMAL HIGH (ref 150–400)
RBC: 3.22 MIL/uL — ABNORMAL LOW (ref 4.22–5.81)
RDW: 14 % (ref 11.5–15.5)
WBC: 16 10*3/uL — ABNORMAL HIGH (ref 4.0–10.5)
nRBC: 0 % (ref 0.0–0.2)

## 2018-01-20 LAB — URINE CULTURE

## 2018-01-20 MED ORDER — CHLORHEXIDINE GLUCONATE CLOTH 2 % EX PADS
6.0000 | MEDICATED_PAD | Freq: Every day | CUTANEOUS | Status: AC
  Administered 2018-01-20 – 2018-01-24 (×5): 6 via TOPICAL

## 2018-01-20 MED ORDER — MUPIROCIN 2 % EX OINT
1.0000 "application " | TOPICAL_OINTMENT | Freq: Two times a day (BID) | CUTANEOUS | Status: AC
  Administered 2018-01-20 – 2018-01-24 (×10): 1 via NASAL
  Filled 2018-01-20 (×3): qty 22

## 2018-01-20 NOTE — Progress Notes (Signed)
Patient blood glucose at 0806 was 66 patient was alert and oriented speaking with nurse in bed with no complaints. Patient had juice and was eating breakfast. At recheck patient blood glucose was 80 and was finishing with breakfast.

## 2018-01-20 NOTE — Progress Notes (Signed)
Inpatient Diabetes Program Recommendations  AACE/ADA: New Consensus Statement on Inpatient Glycemic Control (2015)  Target Ranges:  Prepandial:   less than 140 mg/dL      Peak postprandial:   less than 180 mg/dL (1-2 hours)      Critically ill patients:  140 - 180 mg/dL   Results for Anthony Dunlap, Anthony Dunlap (MRN [id number]) as of 01/20/2018 08:09  Ref. Range 01/19/2018 07:45 01/19/2018 08:41 01/19/2018 11:51 01/19/2018 16:02 01/19/2018 20:56  Glucose-Capillary Latest Ref Range: 70 - 99 mg/dL 51 (L) 101 (H) 150 (H) 241 (H) 160 (H)   Results for Anthony Dunlap, Anthony Dunlap (MRN [id number]) as of 01/20/2018 08:09  Ref. Range 01/20/2018 08:06  Glucose-Capillary Latest Ref Range: 70 - 99 mg/dL 66 (L)     Home DM Meds: Levemir 30 units Daily  Current Orders: Levemir 17 units QHS      Novolog Sensitive Correction Scale/ SSI (0-9 units) TID AC     Hypoglycemic yesterday AM after getting 20 units Levemir the night prior.  Levemir dose reduced to 17 units QHS last PM.  Patient again with Hypoglycemia this AM despite Levemir reduction.    MD- Please consider reducing Levemir to 15 units QHS     --Will follow patient during hospitalization--  Wyn Quaker RN, MSN, CDE Diabetes Coordinator Inpatient Glycemic Control Team Team Pager: [phone] (8a-5p)

## 2018-01-20 NOTE — Progress Notes (Signed)
PROGRESS NOTE    Anthony Dunlap  GYJ:[id number] DOB: 1933/07/10 DOA: 01/17/2018 PCP: [name], MD    Brief Narrative:  84 y.o. male with medical history significant for hypothyroidism, insulin-dependent diabetes mellitus, hyperlipidemia, and history of prostate cancer with chronic Foley catheter, now presenting to the emergency department for evaluation of rigors.  Patient reports that he was in his usual state of health until overnight when he began sweating and developed shaking chills.  He denies any recent cough, shortness of breath, chest pain, vomiting, or diarrhea. Patient was admitted for sepsis due to UTI.   Assessment & Plan: 1-Sepsis secondary to UTI ([place]) -still spiking fever and feeling nauseated -culture results: Demonstrating multiple microorganism and recollection was recommended -Blood culture 1 out of 2 positive for CNS  -follow repeat urine and blood cultures results. -continue IVF's and current IV antibiotics; on IV cefazolin now. -Continue PRN antiemetics and antipyretics -Continue supportive care and follow clinical response  2-mild DKA; type 2 diabetes with hyperglycemia: insulin dependent  -DKA resolved -off insulin drip and due to decreased appetite and marginal oral intake had experienced couple episodes of hypoglycemia. -Latus dose adjusted to 17 units on 01/19/18. -Continue Glucerna -Patient has been advised not to skip any meals. -continue SSI and Lantus -recent A1C 9.3  3-Hypothyroidism -continue synthroid   4-lactic acidosis -in the setting of sepsis and DKA -now resolved after IV fluids resuscitation. -Encouraged to maintain adequate hydration.  5-HLD -continue statins  6-Normocytic anemia -most likely in the setting of anemia of chronic disease -will follow Hgb trend -no signs of overt bleeding. -Repeat CBC in a.m.  DVT prophylaxis: lovenox Code Status: Full Family Communication: no family at bedside  Disposition Plan: Still  spiking fever, 1/2 blood culture +.  Narrow IV antibiotic to cefazolin; repeat blood cultures and urine cultures.  Continue PRN antiemetics and antipyretics.   Consultants:   None   Procedures:   See below for x-ray reports   Antimicrobials:  Anti-infectives (From admission, onward)   Start     Dose/Rate Route Frequency Ordered Stop   01/19/18 1400  ceFAZolin (ANCEF) IVPB 1 g/50 mL premix     1 g 100 mL/hr over 30 Minutes Intravenous Every 8 hours 01/19/18 1104     01/17/18 1200  ceFEPIme (MAXIPIME) 1 g in sodium chloride 0.9 % 100 mL IVPB  Status:  Discontinued     1 g 200 mL/hr over 30 Minutes Intravenous Every 12 hours 01/17/18 0741 01/19/18 1104   01/17/18 0230  ceFEPIme (MAXIPIME) 2 g in sodium chloride 0.9 % 100 mL IVPB     2 g 200 mL/hr over 30 Minutes Intravenous  Once 01/17/18 0224 01/17/18 0400      Subjective: TMAX 101.9; no nausea, no vomiting, no CP, no SOB. Mild hypoglycemia early today.  Objective: Vitals:   01/20/18 0403 01/20/18 0810 01/20/18 1355 01/20/18 1944  BP: 129/72 134/69 (!) 145/77   Pulse: (!) 106 (!) 107 (!) 108   Resp: 19 16 18    Temp: 98.6 F (37 C) 98.5 F (36.9 C) 98.4 F (36.9 C)   TempSrc: Oral Oral Oral   SpO2: 93% 98% 99% 99%  Weight:      Height:        Intake/Output Summary (Last 24 hours) at 01/20/2018 2108 Last data filed at 01/20/2018 1900 Gross per 24 hour  Intake 1543.02 ml  Output 400 ml  Net 1143.02 ml   Filed Weights   01/17/18 0209 01/17/18 0600  Weight:  70.3 kg 67.1 kg    Examination: General exam: Alert, awake, oriented x 3; reports feeling ok. Still spiking fever and expressed poor appetite. Respiratory system: Clear to auscultation. Respiratory effort normal. Cardiovascular system:RRR. No murmurs, rubs, gallops. Gastrointestinal system: Abdomen is nondistended, soft and nontender. No organomegaly or masses felt. Normal bowel sounds heard. Central nervous system: Alert and oriented. No focal neurological  deficits. Extremities: No C/C/E, +pedal pulses Skin: No rashes, lesions or ulcers Psychiatry: Judgement and insight appear normal. Mood & affect appropriate.    Data Reviewed: I have personally reviewed following labs and imaging studies  CBC: Recent Labs  Lab 01/17/18 0237 01/18/18 0417 01/20/18 0547  WBC 20.8* 12.6* 16.0*  HGB 8.7* 8.7* 8.0*  HCT 28.4* 29.1* 26.2*  MCV 81.8 83.4 81.4  PLT 649* 681* 478*   Basic Metabolic Panel: Recent Labs  Lab 01/17/18 0237 01/17/18 0443 01/17/18 0744 01/18/18 0417 01/20/18 0547  NA 127* 131* 138 135 132*  K 4.0 3.6 3.9 3.7 3.5  CL 96* 104 108 104 100  CO2 19* 20* 21* 22 25  GLUCOSE 440* 347* 129* 104* 92  BUN 20 19 17 13 13   CREATININE 1.07 0.97 0.82 0.76 0.77  CALCIUM 7.9* 7.2* 8.2* 8.1* 7.8*   GFR: Estimated Creatinine Clearance: 67.6 mL/min (by C-G formula based on SCr of 0.77 mg/dL).   Liver Function Tests: Recent Labs  Lab 01/17/18 0237  AST 19  ALT 15  ALKPHOS 152*  BILITOT 0.7  PROT 7.2  ALBUMIN 2.5*   Coagulation Profile: Recent Labs  Lab 01/17/18 0443  INR 1.31   CBG: Recent Labs  Lab 01/19/18 2056 01/20/18 0806 01/20/18 0822 01/20/18 1125 01/20/18 1643  GLUCAP 160* 66* 80 233* 140*   Urine analysis:    Component Value Date/Time   COLORURINE YELLOW 01/17/2018 0237   APPEARANCEUR CLOUDY (A) 01/17/2018 0237   LABSPEC 1.022 01/17/2018 0237   PHURINE 5.0 01/17/2018 0237   GLUCOSEU >=500 (A) 01/17/2018 0237   HGBUR SMALL (A) 01/17/2018 0237   BILIRUBINUR NEGATIVE 01/17/2018 0237   KETONESUR 5 (A) 01/17/2018 0237   PROTEINUR 30 (A) 01/17/2018 0237   UROBILINOGEN 0.2 09/03/2013 0132   NITRITE POSITIVE (A) 01/17/2018 0237   LEUKOCYTESUR LARGE (A) 01/17/2018 0237    Recent Results (from the past 240 hour(s))  Urine culture     Status: Abnormal   Collection Time: 01/17/18  2:23 AM  Result Value Ref Range Status   Specimen Description   Final    URINE, CATHETERIZED Performed at [facility], [address], [place], [place] [address]    Special Requests   Final    Normal Performed at [facility], [address] [place], [place] [address]    [place], SUGGEST RECOLLECTION (A)  Final   Report Status 01/18/2018 FINAL  Final  Blood Culture (routine x 2)     Status: Abnormal (Preliminary result)   Collection Time: 01/17/18  2:40 AM  Result Value Ref Range Status   Specimen Description   Final    BLOOD RIGHT FOREARM Performed at [facility], [address] [place], [place] [address]    Special Requests   Final    BOTTLES DRAWN AEROBIC AND ANAEROBIC Blood Culture results may not be optimal due to an excessive volume of blood received in culture bottles Performed at  [facility], [address] [place], [place] [address]    Culture  Setup Time   Final    GRAM POSITIVE COCCI ANAEROBIC  BOTTLE Gram Stain Report Called to,Read Back By and Verified With: HYATT,L@1344  BY MATTHEWS,B 11.30.19 [facility] Organism ID to follow CRITICAL RESULT CALLED TO, READ BACK BY AND VERIFIED WITH: Cletis Media RN [id number] BY GF Performed at [facility], [place] [address], [place], [place] [address]    Culture PEPTOSTREPTOCOCCUS ASACCHAROLYTICUS (A)  Final   Report Status PENDING  Incomplete  Blood Culture ID Panel (Reflexed)     Status: None   Collection Time: 01/17/18  2:40 AM  Result Value Ref Range Status   Enterococcus species NOT DETECTED NOT DETECTED Final   Listeria monocytogenes NOT DETECTED NOT DETECTED Final   Staphylococcus species NOT DETECTED NOT DETECTED Final   Staphylococcus aureus (BCID) NOT DETECTED NOT DETECTED Final   Streptococcus species NOT DETECTED NOT DETECTED Final   Streptococcus agalactiae NOT DETECTED NOT DETECTED Final   Streptococcus pneumoniae NOT DETECTED NOT DETECTED Final   Streptococcus pyogenes NOT DETECTED NOT DETECTED Final   Acinetobacter baumannii NOT DETECTED NOT DETECTED Final   Enterobacteriaceae species NOT  DETECTED NOT DETECTED Final   Enterobacter cloacae complex NOT DETECTED NOT DETECTED Final   Escherichia coli NOT DETECTED NOT DETECTED Final   Klebsiella oxytoca NOT DETECTED NOT DETECTED Final   Klebsiella pneumoniae NOT DETECTED NOT DETECTED Final   Proteus species NOT DETECTED NOT DETECTED Final   Serratia marcescens NOT DETECTED NOT DETECTED Final   Haemophilus influenzae NOT DETECTED NOT DETECTED Final   Neisseria meningitidis NOT DETECTED NOT DETECTED Final   Pseudomonas aeruginosa NOT DETECTED NOT DETECTED Final   Candida albicans NOT DETECTED NOT DETECTED Final   Candida glabrata NOT DETECTED NOT DETECTED Final   Candida krusei NOT DETECTED NOT DETECTED Final   Candida parapsilosis NOT DETECTED NOT DETECTED Final   Candida tropicalis NOT DETECTED NOT DETECTED Final    Comment: Performed at [facility], [place] [address] [place], [place] [address]  Blood Culture (routine x 2)     Status: Abnormal (Preliminary result)   Collection Time: 01/17/18  2:59 AM  Result Value Ref Range Status   Specimen Description   Final    BLOOD LEFT ANTECUBITAL Performed at [facility], [address] [place], [place] [address]    Special Requests   Final    BOTTLES DRAWN AEROBIC AND ANAEROBIC Blood Culture adequate volume Performed at [facility], [address] [place], [place] [address]    Culture  Setup Time   Final    GRAM POSITIVE COCCI Gram Stain Report Called to,Read Back By and Verified With: MOTLEY @ [id number] ON [id number] BY HENDERSON L. Performed at [facility], [address] [place], [place] [address]    Culture PEPTOSTREPTOCOCCUS ASACCHAROLYTICUS (A)  Final   Report Status PENDING  Incomplete  MRSA PCR Screening     Status: Abnormal   Collection Time: 01/17/18  5:56 AM  Result Value Ref Range Status   MRSA by PCR POSITIVE (A) NEGATIVE Final    Comment:        The GeneXpert MRSA Assay (FDA approved for NASAL specimens only), is one component of a comprehensive MRSA  colonization surveillance program. It is not intended to diagnose MRSA infection nor to guide or monitor treatment for MRSA infections. RESULT CALLED TO, READ BACK BY AND VERIFIED WITH: EVANS,H @ 6644 ON 01/17/18 BY JUW Performed at [facility], [address], [place], [place] [address]   Culture, Urine     Status: None   Collection Time: 01/19/18 11:04 AM  Result Value Ref Range Status   Specimen Description   Final    URINE, CLEAN CATCH Performed at [facility], [address] [place], [place] [address]    Special Requests   Final    NONE Performed at [facility], [address], [place], [place] [address]    Culture   Final    Multiple bacterial morphotypes present, none predominant. Suggest appropriate recollection if clinically indicated.   Report Status 01/20/2018 FINAL  Final  Culture, blood (Routine X 2) w Reflex to ID Panel     Status: None (Preliminary result)   Collection Time: 01/19/18 11:26 AM  Result Value Ref Range Status   Specimen Description RIGHT ANTECUBITAL  Final   Special Requests   Final    BOTTLES DRAWN AEROBIC AND ANAEROBIC Blood Culture adequate volume   Culture   Final    NO GROWTH < 24 HOURS Performed at [facility], [address] [place], [place] [address]    Report Status PENDING  Incomplete  Culture, blood (Routine X 2) w Reflex to ID Panel     Status: None (Preliminary result)   Collection Time: 01/19/18 11:37 AM  Result Value Ref Range Status   Specimen Description BLOOD LEFT HAND  Final   Special Requests   Final    BOTTLES DRAWN AEROBIC AND ANAEROBIC Blood Culture adequate volume   Culture   Final    NO GROWTH < 24 HOURS Performed at [facility], [address] [place], [place] [address]    Report Status PENDING  Incomplete    Scheduled Meds: . aspirin EC  81 mg Oral Daily  . Chlorhexidine Gluconate Cloth  6 each Topical Q0600  . enoxaparin (LOVENOX) injection  40 mg Subcutaneous Q24H  . feeding supplement (GLUCERNA SHAKE)   237 mL Oral BID BM  . insulin aspart  0-9 Units Subcutaneous TID WC  . insulin detemir  17 Units Subcutaneous QHS  . levothyroxine  25 mcg Oral Q0600  . mupirocin ointment  1 application Nasal BID  . pravastatin  10 mg Oral q1800   Continuous Infusions: .  ceFAZolin (ANCEF) IV 1 g (01/20/18 1343)     LOS: 3 days    Time spent: 30 minutes   Barton Dubois, MD [facility] Pager [phone]  If 7PM-7AM, please contact night-coverage [email or web address] Password [email or web address] 01/20/2018, 9:08 PM

## 2018-01-20 NOTE — Progress Notes (Signed)
Patient spiked temp of 101.8 at 0015, patient given 650mg  of Tylenol at 0020 temp returned to normal range.  Patient had no other issues during the night.

## 2018-01-21 LAB — GLUCOSE, CAPILLARY
Glucose-Capillary: 100 mg/dL — ABNORMAL HIGH (ref 70–99)
Glucose-Capillary: 215 mg/dL — ABNORMAL HIGH (ref 70–99)
Glucose-Capillary: 183 mg/dL — ABNORMAL HIGH (ref 70–99)
Glucose-Capillary: 239 mg/dL — ABNORMAL HIGH (ref 70–99)

## 2018-01-21 MED ORDER — PENICILLIN G POT IN DEXTROSE 60000 UNIT/ML IV SOLN
3.0000 10*6.[IU] | INTRAVENOUS | Status: DC
  Filled 2018-01-21 (×7): qty 50

## 2018-01-21 MED ORDER — PENICILLIN G 3 MILLION UNITS IVPB - SIMPLE MED
3.0000 10*6.[IU] | INTRAVENOUS | Status: DC
  Administered 2018-01-21 – 2018-01-22 (×7): 3 10*6.[IU] via INTRAVENOUS
  Filled 2018-01-21: qty 100
  Filled 2018-01-21 (×3): qty 3
  Filled 2018-01-21: qty 100
  Filled 2018-01-21: qty 3
  Filled 2018-01-21 (×3): qty 100
  Filled 2018-01-21 (×2): qty 3
  Filled 2018-01-21 (×2): qty 100

## 2018-01-21 MED ORDER — PENICILLIN G 3 MILLION UNITS IVPB - SIMPLE MED
3.0000 10*6.[IU] | INTRAVENOUS | Status: DC
  Filled 2018-01-21 (×7): qty 100

## 2018-01-21 NOTE — Progress Notes (Signed)
PROGRESS NOTE    Anthony Dunlap  BDZ:[id number] DOB: March 14, 1933 DOA: 01/17/2018 PCP: [name], MD    Brief Narrative:  84 y.o. male with medical history significant for hypothyroidism, insulin-dependent diabetes mellitus, hyperlipidemia, and history of prostate cancer with chronic Foley catheter, now presenting to the emergency department for evaluation of rigors.  Patient reports that he was in his usual state of health until overnight when he began sweating and developed shaking chills.  He denies any recent cough, shortness of breath, chest pain, vomiting, or diarrhea. Patient was admitted for sepsis due to UTI.   Assessment & Plan: 1-Sepsis secondary to PeptoStreptococcus bacteremia ([name]) -still spiking fever and feeling nauseated -Based on results antibiotic has been now transitioned to penicillin G -Since he is still spiking fever will check 2D echo -follow repeat urine and blood cultures results. -continue IVF's and antipyretics -Continue supportive care follow clinical response.  2-mild DKA; type 2 diabetes with hyperglycemia: insulin dependent  -DKA resolved -off insulin drip and due to decreased appetite and marginal oral intake; he had experienced couple episodes of hypoglycemia. -Latus dose adjusted to 17 units on 01/19/18. -Continue Glucerna -Patient has been advised not to skip any meals. -continue SSI and Lantus -recent A1C 9.3 -No further episodes of hypoglycemia in the last 48 hours.  3-Hypothyroidism -continue synthroid   4-lactic acidosis -in the setting of sepsis and DKA -now resolved after IV fluids resuscitation. -Encouraged to maintain adequate hydration.  5-HLD -continue statins  6-Normocytic anemia -most likely in the setting of anemia of chronic disease -will follow Hgb trend -no signs of overt bleeding. -Repeat CBC in a.m.  DVT prophylaxis: lovenox Code Status: Full Family Communication: no family at bedside  Disposition Plan: Still  spiking fever, blood culture positive for peptoStreptococcus. Antibiotics changed to [facility] G.  Continue PRN antiemetics and antipyretics.   Consultants:   None   Procedures:   See below for x-ray reports   Antimicrobials:  Anti-infectives (From admission, onward)   Start     Dose/Rate Route Frequency Ordered Stop   01/21/18 1200  penicillin G 3 million units in sodium chloride 0.9% 100 mL IVPB  Status:  Discontinued     3 Million Units 200 mL/hr over 30 Minutes Intravenous Every 4 hours 01/21/18 1050 01/21/18 1100   01/21/18 1200  penicillin G potassium 3 Million Units in dextrose 36mL IVPB  Status:  Discontinued     3 Million Units 100 mL/hr over 30 Minutes Intravenous Every 4 hours 01/21/18 1100 01/21/18 1106   01/21/18 1200  penicillin G 3 million units in sodium chloride 0.9% 100 mL IVPB     3 Million Units 200 mL/hr over 30 Minutes Intravenous Every 4 hours 01/21/18 1106     01/19/18 1400  ceFAZolin (ANCEF) IVPB 1 g/50 mL premix  Status:  Discontinued     1 g 100 mL/hr over 30 Minutes Intravenous Every 8 hours 01/19/18 1104 01/21/18 1050   01/17/18 1200  ceFEPIme (MAXIPIME) 1 g in sodium chloride 0.9 % 100 mL IVPB  Status:  Discontinued     1 g 200 mL/hr over 30 Minutes Intravenous Every 12 hours 01/17/18 0741 01/19/18 1104   01/17/18 0230  ceFEPIme (MAXIPIME) 2 g in sodium chloride 0.9 % 100 mL IVPB     2 g 200 mL/hr over 30 Minutes Intravenous  Once 01/17/18 0224 01/17/18 0400      Subjective: Still spiking fever (T-max 102.7); patient denies chest pain, no nausea, no vomiting, no abdominal pain,  no further dysuria.  He reports his appetite is improving and since yesterday has been getting better.  Objective: Vitals:   01/20/18 1944 01/20/18 2143 01/21/18 0054 01/21/18 0625  BP:  135/74  (!) 141/82  Pulse:  (!) 114  (!) 105  Resp:  20  18  Temp:  (!) 102.7 F (39.3 C) (!) 97.5 F (36.4 C) 98.5 F (36.9 C)  TempSrc:  Oral Oral Oral  SpO2: 99% 94%  96%  Weight:       Height:        Intake/Output Summary (Last 24 hours) at 01/21/2018 1628 Last data filed at 01/21/2018 3007 Gross per 24 hour  Intake 480 ml  Output 1000 ml  Net -520 ml   Filed Weights   01/17/18 0209 01/17/18 0600  Weight: 70.3 kg 67.1 kg    Examination: General exam: Alert, awake, oriented x 3; still spiking fever, no chest pain, no shortness of breath. Respiratory system: Clear to auscultation. Respiratory effort normal. Cardiovascular system:RRR. No murmurs, rubs, gallops. Gastrointestinal system: Abdomen is nondistended, soft and nontender. No organomegaly or masses felt. Normal bowel sounds heard. Central nervous system: Alert and oriented. No focal neurological deficits. Extremities: No C/C/E, +pedal pulses Skin: No rashes, lesions or ulcers Psychiatry: Judgement and insight appear normal. Mood & affect appropriate.   Data Reviewed: I have personally reviewed following labs and imaging studies  CBC: Recent Labs  Lab 01/17/18 0237 01/18/18 0417 01/20/18 0547  WBC 20.8* 12.6* 16.0*  HGB 8.7* 8.7* 8.0*  HCT 28.4* 29.1* 26.2*  MCV 81.8 83.4 81.4  PLT 649* 681* 622*   Basic Metabolic Panel: Recent Labs  Lab 01/17/18 0237 01/17/18 0443 01/17/18 0744 01/18/18 0417 01/20/18 0547  NA 127* 131* 138 135 132*  K 4.0 3.6 3.9 3.7 3.5  CL 96* 104 108 104 100  CO2 19* 20* 21* 22 25  GLUCOSE 440* 347* 129* 104* 92  BUN 20 19 17 13 13   CREATININE 1.07 0.97 0.82 0.76 0.77  CALCIUM 7.9* 7.2* 8.2* 8.1* 7.8*   GFR: Estimated Creatinine Clearance: 67.6 mL/min (by C-G formula based on SCr of 0.77 mg/dL).   Liver Function Tests: Recent Labs  Lab 01/17/18 0237  AST 19  ALT 15  ALKPHOS 152*  BILITOT 0.7  PROT 7.2  ALBUMIN 2.5*   Coagulation Profile: Recent Labs  Lab 01/17/18 0443  INR 1.31   CBG: Recent Labs  Lab 01/20/18 1125 01/20/18 1643 01/20/18 2152 01/21/18 0751 01/21/18 1125  GLUCAP 233* 140* 225* 100* 215*   Urine analysis:    Component  Value Date/Time   COLORURINE YELLOW 01/17/2018 0237   APPEARANCEUR CLOUDY (A) 01/17/2018 0237   LABSPEC 1.022 01/17/2018 0237   PHURINE 5.0 01/17/2018 0237   GLUCOSEU >=500 (A) 01/17/2018 0237   HGBUR SMALL (A) 01/17/2018 0237   BILIRUBINUR NEGATIVE 01/17/2018 0237   KETONESUR 5 (A) 01/17/2018 0237   PROTEINUR 30 (A) 01/17/2018 0237   UROBILINOGEN 0.2 09/03/2013 0132   NITRITE POSITIVE (A) 01/17/2018 0237   LEUKOCYTESUR LARGE (A) 01/17/2018 0237    Recent Results (from the past 240 hour(s))  Urine culture     Status: Abnormal   Collection Time: 01/17/18  2:23 AM  Result Value Ref Range Status   Specimen Description   Final    URINE, CATHETERIZED Performed at [facility], [address] [place], [place] [address]    Special Requests   Final    Normal Performed at [facility], [address], [place],  [place] [address]    Culture MULTIPLE SPECIES PRESENT, SUGGEST RECOLLECTION (A)  Final   Report Status 01/18/2018 FINAL  Final  Blood Culture (routine x 2)     Status: Abnormal   Collection Time: 01/17/18  2:40 AM  Result Value Ref Range Status   Specimen Description BLOOD RIGHT FOREARM  Final   Special Requests   Final    BOTTLES DRAWN AEROBIC AND ANAEROBIC Blood Culture results may not be optimal due to an excessive volume of blood received in culture bottles   Culture  Setup Time   Final    GRAM POSITIVE COCCI ANAEROBIC BOTTLE Gram Stain Report Called to,Read Back By and Verified With: HYATT,L@1344  BY MATTHEWS,B 11.30.19  HOSP Organism ID to follow CRITICAL RESULT CALLED TO, READ BACK BY AND VERIFIED WITH: Y Spencer [id number] BY GF    Culture PEPTOSTREPTOCOCCUS ASACCHAROLYTICUS (A)  Final   Report Status 01/21/2018 FINAL  Final  Blood Culture ID Panel (Reflexed)     Status: None   Collection Time: 01/17/18  2:40 AM  Result Value Ref Range Status   Enterococcus species NOT DETECTED NOT DETECTED Final   Listeria monocytogenes NOT DETECTED NOT DETECTED  Final   Staphylococcus species NOT DETECTED NOT DETECTED Final   Staphylococcus aureus (BCID) NOT DETECTED NOT DETECTED Final   Streptococcus species NOT DETECTED NOT DETECTED Final   Streptococcus agalactiae NOT DETECTED NOT DETECTED Final   Streptococcus pneumoniae NOT DETECTED NOT DETECTED Final   Streptococcus pyogenes NOT DETECTED NOT DETECTED Final   Acinetobacter baumannii NOT DETECTED NOT DETECTED Final   Enterobacteriaceae species NOT DETECTED NOT DETECTED Final   Enterobacter cloacae complex NOT DETECTED NOT DETECTED Final   Escherichia coli NOT DETECTED NOT DETECTED Final   Klebsiella oxytoca NOT DETECTED NOT DETECTED Final   Klebsiella pneumoniae NOT DETECTED NOT DETECTED Final   Proteus species NOT DETECTED NOT DETECTED Final   Serratia marcescens NOT DETECTED NOT DETECTED Final   Haemophilus influenzae NOT DETECTED NOT DETECTED Final   Neisseria meningitidis NOT DETECTED NOT DETECTED Final   Pseudomonas aeruginosa NOT DETECTED NOT DETECTED Final   Candida albicans NOT DETECTED NOT DETECTED Final   Candida glabrata NOT DETECTED NOT DETECTED Final   Candida krusei NOT DETECTED NOT DETECTED Final   Candida parapsilosis NOT DETECTED NOT DETECTED Final   Candida tropicalis NOT DETECTED NOT DETECTED Final    Comment: Performed at [facility], [address] [place], [place] [address]  Blood Culture (routine x 2)     Status: Abnormal   Collection Time: 01/17/18  2:59 AM  Result Value Ref Range Status   Specimen Description   Final    BLOOD LEFT ANTECUBITAL Performed at [facility], [address], [place], [place] [address]    Special Requests   Final    BOTTLES DRAWN AEROBIC AND ANAEROBIC Blood Culture adequate volume Performed at [facility], [address], [place], [place] [address]    Culture  Setup Time   Final    GRAM POSITIVE COCCI Gram Stain Report Called to,Read Back By and Verified With: MOTLEY @ 2536 ON [id number] BY HENDERSON L. Performed at [facility], [address], [place], [place] [address]    Culture PEPTOSTREPTOCOCCUS ASACCHAROLYTICUS (A)  Final   Report Status 01/21/2018 FINAL  Final  MRSA PCR Screening     Status: Abnormal   Collection Time: 01/17/18  5:56 AM  Result Value Ref Range Status   MRSA by PCR POSITIVE (A) NEGATIVE  Final    Comment:        The GeneXpert MRSA Assay (FDA approved for NASAL specimens only), is one component of a comprehensive MRSA colonization surveillance program. It is not intended to diagnose MRSA infection nor to guide or monitor treatment for MRSA infections. RESULT CALLED TO, READ BACK BY AND VERIFIED WITH: EVANS,H @ 0354 ON 01/17/18 BY JUW Performed at [facility], [address], [place], [place] [address]   Culture, Urine     Status: None   Collection Time: 01/19/18 11:04 AM  Result Value Ref Range Status   Specimen Description   Final    URINE, CLEAN CATCH Performed at [facility], [address] [place], [place] [address]    Special Requests   Final    NONE Performed at [facility], [address] [place], [place] [address]    Culture   Final    Multiple bacterial morphotypes present, none predominant. Suggest appropriate recollection if clinically indicated.   Report Status 01/20/2018 FINAL  Final  Culture, blood (Routine X 2) w Reflex to ID Panel     Status: None (Preliminary result)   Collection Time: 01/19/18 11:26 AM  Result Value Ref Range Status   Specimen Description RIGHT ANTECUBITAL  Final   Special Requests   Final    BOTTLES DRAWN AEROBIC AND ANAEROBIC Blood Culture adequate volume   Culture   Final    NO GROWTH 2 DAYS Performed at [facility], [address], [place], [place] [address]    Report Status PENDING  Incomplete  Culture, blood (Routine X 2) w Reflex to ID Panel     Status: None (Preliminary result)   Collection Time: 01/19/18 11:37 AM  Result Value Ref Range Status   Specimen Description BLOOD LEFT HAND  Final   Special Requests   Final     BOTTLES DRAWN AEROBIC AND ANAEROBIC Blood Culture adequate volume   Culture   Final    NO GROWTH 2 DAYS Performed at [facility], [address], [place], [place] [address]    Report Status PENDING  Incomplete    Scheduled Meds: . aspirin EC  81 mg Oral Daily  . Chlorhexidine Gluconate Cloth  6 each Topical Q0600  . enoxaparin (LOVENOX) injection  40 mg Subcutaneous Q24H  . feeding supplement (GLUCERNA SHAKE)  237 mL Oral BID BM  . insulin aspart  0-9 Units Subcutaneous TID WC  . insulin detemir  17 Units Subcutaneous QHS  . levothyroxine  25 mcg Oral Q0600  . mupirocin ointment  1 application Nasal BID  . pencillin G potassium IV  3 Million Units Intravenous Q4H  . pravastatin  10 mg Oral q1800   Continuous Infusions:    LOS: 4 days    Time spent: 30 minutes   Barton Dubois, MD [facility] Pager [phone]  If 7PM-7AM, please contact night-coverage [email or web address] Password [facility] 01/21/2018, 4:28 PM

## 2018-01-21 NOTE — Progress Notes (Signed)
Inpatient Diabetes Program Recommendations  AACE/ADA: New Consensus Statement on Inpatient Glycemic Control (2015)  Target Ranges:  Prepandial:   less than 140 mg/dL      Peak postprandial:   less than 180 mg/dL (1-2 hours)      Critically ill patients:  140 - 180 mg/dL   Results for BRYLON, BRENNING (MRN [id number]) as of 01/21/2018 11:53  Ref. Range 01/20/2018 08:06 01/20/2018 08:22 01/20/2018 11:25 01/20/2018 16:43 01/20/2018 21:52  Glucose-Capillary Latest Ref Range: 70 - 99 mg/dL 66 (L) 80 233 (H)  3 units NOVOLOG  140 (H)  1 unit NOVOLOG  225 (H)    17 units LEVEMIR   Results for DAGOBERTO, NEALY (MRN [id number]) as of 01/21/2018 11:53  Ref. Range 01/21/2018 07:51 01/21/2018 11:25  Glucose-Capillary Latest Ref Range: 70 - 99 mg/dL 100 (H)  0 units NOVOLOG  215 (H)  3 units NOVOLOG     Home DM Meds: Levemir 30 units Daily  Current Orders: Levemir 17 units QHS                            Novolog Sensitive Correction Scale/ SSI (0-9 units) TID AC      Patient with Hypoglycemia yesterday AM, however, CBG stable this AM: 100 mg/dl.  Having post-meal glucose elevations.  Eating 75-100% of meals per documentation.      MD- Please consider starting Novolog Meal Coverage:  Novolog 3 units TID with meals   (Please add the following Hold Parameters: Hold if pt eats <50% of meal, Hold if pt NPO)     --Will follow patient during hospitalization--  Wyn Quaker RN, MSN, CDE Diabetes Coordinator Inpatient Glycemic Control Team Team Pager: [phone] (8a-5p)

## 2018-01-22 ENCOUNTER — Other Ambulatory Visit (HOSPITAL_COMMUNITY): Payer: Medicare Other

## 2018-01-22 ENCOUNTER — Inpatient Hospital Stay (HOSPITAL_COMMUNITY): Payer: Medicare Other

## 2018-01-22 DIAGNOSIS — A414 Sepsis due to anaerobes: Principal | ICD-10-CM

## 2018-01-22 DIAGNOSIS — A419 Sepsis, unspecified organism: Secondary | ICD-10-CM

## 2018-01-22 LAB — CULTURE, BLOOD (ROUTINE X 2): Special Requests: ADEQUATE

## 2018-01-22 LAB — BASIC METABOLIC PANEL
Anion gap: 8 (ref 5–15)
BUN: 15 mg/dL (ref 8–23)
CO2: 26 mmol/L (ref 22–32)
CREATININE: 0.77 mg/dL (ref 0.61–1.24)
Calcium: 7.9 mg/dL — ABNORMAL LOW (ref 8.9–10.3)
Chloride: 99 mmol/L (ref 98–111)
GFR calc Af Amer: >60 mL/min (ref >60)
GFR calc non Af Amer: >60 mL/min (ref >60)
Glucose, Bld: 78 mg/dL (ref 70–99)
Potassium: 3.4 mmol/L — ABNORMAL LOW (ref 3.5–5.1)
Sodium: 133 mmol/L — ABNORMAL LOW (ref 135–145)

## 2018-01-22 LAB — CBC
HCT: 28 % — ABNORMAL LOW (ref 39.0–52.0)
Hemoglobin: 8.5 g/dL — ABNORMAL LOW (ref 13.0–17.0)
MCH: 24.3 pg — ABNORMAL LOW (ref 26.0–34.0)
MCHC: 30.4 g/dL (ref 30.0–36.0)
MCV: 80 fL (ref 80.0–100.0)
Platelets: 573 10*3/uL — ABNORMAL HIGH (ref 150–400)
RBC: 3.5 MIL/uL — ABNORMAL LOW (ref 4.22–5.81)
RDW: 14.3 % (ref 11.5–15.5)
WBC: 16.8 10*3/uL — ABNORMAL HIGH (ref 4.0–10.5)
nRBC: 0 % (ref 0.0–0.2)

## 2018-01-22 LAB — GLUCOSE, CAPILLARY
Glucose-Capillary: 76 mg/dL (ref 70–99)
Glucose-Capillary: 147 mg/dL — ABNORMAL HIGH (ref 70–99)
Glucose-Capillary: 202 mg/dL — ABNORMAL HIGH (ref 70–99)
Glucose-Capillary: 158 mg/dL — ABNORMAL HIGH (ref 70–99)

## 2018-01-22 MED ORDER — SODIUM CHLORIDE 0.9 % IV SOLN: 1.0000 g | Freq: Three times a day (TID) | INTRAVENOUS | Status: DC

## 2018-01-22 MED ORDER — IOPAMIDOL (ISOVUE-300) INJECTION 61%
100.0000 mL | Freq: Once | INTRAVENOUS | Status: AC | PRN
  Administered 2018-01-22: 100 mL via INTRAVENOUS

## 2018-01-22 MED ORDER — METRONIDAZOLE IN NACL 5-0.79 MG/ML-% IV SOLN
500.0000 mg | Freq: Three times a day (TID) | INTRAVENOUS | Status: DC
  Administered 2018-01-22 – 2018-01-23 (×2): 500 mg via INTRAVENOUS
  Filled 2018-01-22 (×2): qty 100

## 2018-01-22 NOTE — Progress Notes (Signed)
Inpatient Diabetes Program Recommendations  AACE/ADA: New Consensus Statement on Inpatient Glycemic Control (2015)  Target Ranges:  Prepandial:   less than 140 mg/dL      Peak postprandial:   less than 180 mg/dL (1-2 hours)      Critically ill patients:  140 - 180 mg/dL   Lab Results  Component Value Date   GLUCAP 76 01/22/2018   HGBA1C 9.6 (H) 01/04/2018    Review of Glycemic Control Results for Anthony Dunlap, Anthony Dunlap (MRN [id number]) as of 01/22/2018 09:16  Ref. Range 01/21/2018 11:25 01/21/2018 16:39 01/21/2018 22:35 01/22/2018 08:06  Glucose-Capillary Latest Ref Range: 70 - 99 mg/dL 215 (H) 183 (H) 239 (H) 76    Home DM Meds:Levemir 30 units Daily Current Orders:Levemir 17 units QHS Novolog Sensitive Correction Scale/ SSI (0-9 units) TID AC   Patient with Hypoglycemia yesterday AM, however, CBG stable this AM: 100 mg/dl. Having post-meal glucose elevations. Eating 75-100% of meals per documentation.     MD- Please consider starting Novolog Meal Coverage:  Novolog 3 units TID with meals  (Please add the following Hold Parameters: Hold if pt eats <50% of meal, Hold if pt NPO)  Also, FSBS was 76 mg/dL, may want to consider decreasing Levemir to 15 units QD.  Thanks, Bronson Curb, MSN, RNC-OB Diabetes Coordinator [phone] (8a-5p)

## 2018-01-22 NOTE — Progress Notes (Signed)
PROGRESS NOTE  Anthony Dunlap:[id number] DOB: 1933-11-23 DOA: 01/17/2018 PCP: [name], MD  Brief History:  84 y.o.malewith medical history significant forhypothyroidism, insulin-dependent diabetes mellitus, hyperlipidemia, and history of prostate cancer with chronic Foley catheter, now presenting to the emergency department for evaluation of rigors. Patient reports that he was in his usual state of health until overnight when he began sweating and developed shaking chills. In the ED, pt was febrile with HR in 140s.  He also had serum glucose of 440 with anion gap of 12.  He was initially started on IVF and IV insulin.  He denies any recent cough, shortness of breath, chest pain, vomiting, or diarrhea. Patient was admitted for sepsis due to presumptive UTI initially.  Urine culture was not revealing.  Blood cultures grew peptostreptococcus and Bacteroides thetaiodomicron.  Antibiotics were adjusted.  Assessment/Plan: Sepsis -due to peptostreptococcus and bacteroides bacteremia - repeat blood cultures remain neg -urine cultures with multiple organisms -still spiking fevers up to 101.0  Bacteremia--Peptostreptococcus and Bacteroides -start flagl IV -d/c PCN -repeat blood cultures neg -suspect intraabdominal source -CT abd  DKA type 2 --patient started on IV insulin with q 1 hour CBG check and q 4 hour BMPs -pt started on aggressive fluid resuscitation -Electrolytes were monitored and repleted -transitioned to [place] insulin once anion gap closed -diet was advanced once anion gap closed -HbA1C--9.3 -continue levemir 17 units  Hypothyroidism -continue synthroid  Hyperlipidemia -continue statin  Indwelling foley catheter -due to have it exchanged some time in December        Disposition Plan:   Home when afebrile 24 hours Family Communication:   Family at bedside  Consultants:  none  Code Status:  FULL  DVT Prophylaxis:  [place]  Lovenox   Procedures: As Listed in Progress Note Above  Antibiotics: Flagyl 12/4>>> Pen G 12/3>>>12/4 Cefazolin 12/1>>12/3 Cefepime 11/29>>>12/1     Subjective: Pt is feeling better but remains weak.  Still have fevers today.  Denies cp, sob, n/v/d, abd pain.  No dysuria  Objective: Vitals:   01/22/18 0053 01/22/18 0200 01/22/18 0410 01/22/18 1205  BP:   121/76 129/78  Pulse:   86 100  Resp:   17 18  Temp: (!) 100.7 F (38.2 C) 98.8 F (37.1 C) (!) 97.5 F (36.4 C) 98.8 F (37.1 C)  TempSrc: Axillary Axillary Axillary Axillary  SpO2:   96% 96%  Weight:      Height:        Intake/Output Summary (Last 24 hours) at 01/22/2018 1751 Last data filed at 01/22/2018 0409 Gross per 24 hour  Intake 540 ml  Output 750 ml  Net -210 ml   Weight change:  Exam:   General:  Pt is alert, follows commands appropriately, not in acute distress  HEENT: No icterus, No thrush, No neck mass, [place]/AT  Cardiovascular: RRR, S1/S2, no rubs, no gallops  Respiratory: fine bibasilar rales. No wheeze  Abdomen: Soft/+BS, non tender, non distended, no guarding  Extremities: No edema, No lymphangitis, No petechiae, No rashes, no synovitis   Data Reviewed: I have personally reviewed following labs and imaging studies Basic Metabolic Panel: Recent Labs  Lab 01/17/18 0443 01/17/18 0744 01/18/18 0417 01/20/18 0547 01/22/18 0548  NA 131* 138 135 132* 133*  K 3.6 3.9 3.7 3.5 3.4*  CL 104 108 104 100 99  CO2 20* 21* 22 25 26   GLUCOSE 347* 129* 104* 92 78  BUN 19 17 13  13  15  CREATININE 0.97 0.82 0.76 0.77 0.77  CALCIUM 7.2* 8.2* 8.1* 7.8* 7.9*   Liver Function Tests: Recent Labs  Lab 01/17/18 0237  AST 19  ALT 15  ALKPHOS 152*  BILITOT 0.7  PROT 7.2  ALBUMIN 2.5*   No results for input(s): LIPASE, AMYLASE in the last 168 hours. No results for input(s): AMMONIA in the last 168 hours. Coagulation Profile: Recent Labs  Lab 01/17/18 0443  INR 1.31   CBC: Recent Labs   Lab 01/17/18 0237 01/18/18 0417 01/20/18 0547 01/22/18 0548  WBC 20.8* 12.6* 16.0* 16.8*  HGB 8.7* 8.7* 8.0* 8.5*  HCT 28.4* 29.1* 26.2* 28.0*  MCV 81.8 83.4 81.4 80.0  PLT 649* 681* 549* 573*   Cardiac Enzymes: No results for input(s): CKTOTAL, CKMB, CKMBINDEX, TROPONINI in the last 168 hours. BNP: Invalid input(s): POCBNP CBG: Recent Labs  Lab 01/21/18 1639 01/21/18 2235 01/22/18 0806 01/22/18 1141 01/22/18 1651  GLUCAP 183* 239* 76 147* 202*   HbA1C: No results for input(s): HGBA1C in the last 72 hours. Urine analysis:    Component Value Date/Time   COLORURINE YELLOW 01/17/2018 0237   APPEARANCEUR CLOUDY (A) 01/17/2018 0237   LABSPEC 1.022 01/17/2018 0237   PHURINE 5.0 01/17/2018 0237   GLUCOSEU >=500 (A) 01/17/2018 0237   HGBUR SMALL (A) 01/17/2018 0237   BILIRUBINUR NEGATIVE 01/17/2018 0237   KETONESUR 5 (A) 01/17/2018 0237   PROTEINUR 30 (A) 01/17/2018 0237   UROBILINOGEN 0.2 09/03/2013 0132   NITRITE POSITIVE (A) 01/17/2018 0237   LEUKOCYTESUR LARGE (A) 01/17/2018 0237   Sepsis Labs: @LABRCNTIP (procalcitonin:4,lacticidven:4) ) Recent Results (from the past 240 hour(s))  Urine culture     Status: Abnormal   Collection Time: 01/17/18  2:23 AM  Result Value Ref Range Status   Specimen Description   Final    URINE, CATHETERIZED Performed at [facility], [address] [place], [place] [address]    Special Requests   Final    Normal Performed at [facility], [address], [place], [place] [address]    [place], SUGGEST RECOLLECTION (A)  Final   Report Status 01/18/2018 FINAL  Final  Blood Culture (routine x 2)     Status: Abnormal   Collection Time: 01/17/18  2:40 AM  Result Value Ref Range Status   Specimen Description   Final    BLOOD RIGHT FOREARM Performed at [facility], [address] [place], [place] [address]    Special Requests   Final    BOTTLES DRAWN AEROBIC AND ANAEROBIC Blood Culture results may not be  optimal due to an excessive volume of blood received in culture bottles Performed at [place]., [place], [place] [address]    Culture  Setup Time   Final    GRAM POSITIVE COCCI ANAEROBIC BOTTLE Gram Stain Report Called to,Read Back By and Verified With: HYATT,L@1344  BY MATTHEWS,B 11.30.19 [facility] Organism ID to follow CRITICAL RESULT CALLED TO, READ BACK BY AND VERIFIED WITH: Y [place] [id number] BY GF    Culture (A)  Final    PEPTOSTREPTOCOCCUS ASACCHAROLYTICUS BACTEROIDES THETAIOTAOMICRON BETA LACTAMASE POSITIVE Performed at [facility], [place] [address], [place], [place] [address]    Report Status 01/22/2018 FINAL  Final  Blood Culture ID Panel (Reflexed)     Status: None   Collection Time: 01/17/18  2:40 AM  Result Value Ref Range Status   Enterococcus species NOT DETECTED NOT DETECTED Final   Listeria monocytogenes NOT  DETECTED NOT DETECTED Final   Staphylococcus species NOT DETECTED NOT DETECTED Final   Staphylococcus aureus (BCID) NOT DETECTED NOT DETECTED Final   Streptococcus species NOT DETECTED NOT DETECTED Final   Streptococcus agalactiae NOT DETECTED NOT DETECTED Final   Streptococcus pneumoniae NOT DETECTED NOT DETECTED Final   Streptococcus pyogenes NOT DETECTED NOT DETECTED Final   Acinetobacter baumannii NOT DETECTED NOT DETECTED Final   Enterobacteriaceae species NOT DETECTED NOT DETECTED Final   Enterobacter cloacae complex NOT DETECTED NOT DETECTED Final   Escherichia coli NOT DETECTED NOT DETECTED Final   Klebsiella oxytoca NOT DETECTED NOT DETECTED Final   Klebsiella pneumoniae NOT DETECTED NOT DETECTED Final   Proteus species NOT DETECTED NOT DETECTED Final   Serratia marcescens NOT DETECTED NOT DETECTED Final   Haemophilus influenzae NOT DETECTED NOT DETECTED Final   Neisseria meningitidis NOT DETECTED NOT DETECTED Final   Pseudomonas aeruginosa NOT DETECTED NOT DETECTED Final   Candida albicans NOT DETECTED NOT  DETECTED Final   Candida glabrata NOT DETECTED NOT DETECTED Final   Candida krusei NOT DETECTED NOT DETECTED Final   Candida parapsilosis NOT DETECTED NOT DETECTED Final   Candida tropicalis NOT DETECTED NOT DETECTED Final    Comment: Performed at [facility], [place] [address], [place], [place] [address]  Blood Culture (routine x 2)     Status: Abnormal   Collection Time: 01/17/18  2:59 AM  Result Value Ref Range Status   Specimen Description   Final    BLOOD LEFT ANTECUBITAL Performed at [facility], [address] [place], [place] [address]    Special Requests   Final    BOTTLES DRAWN AEROBIC AND ANAEROBIC Blood Culture adequate volume Performed at [facility], [address], [place], [place] [address]    Culture  Setup Time   Final    GRAM POSITIVE COCCI Gram Stain Report Called to,Read Back By and Verified With: MOTLEY @ 3086 ON [id number] BY HENDERSON L. Performed at [facility], [address], [place], [place] [address]    Culture (A)  Final    PEPTOSTREPTOCOCCUS ASACCHAROLYTICUS ANAEROBIC GRAM NEGATIVE ROD BETA LACTAMASE POSITIVE Performed at [facility], [place] [address] [place], [place] [address]    Report Status 01/22/2018 FINAL  Final  MRSA PCR Screening     Status: Abnormal   Collection Time: 01/17/18  5:56 AM  Result Value Ref Range Status   MRSA by PCR POSITIVE (A) NEGATIVE Final    Comment:        The GeneXpert MRSA Assay (FDA approved for NASAL specimens only), is one component of a comprehensive MRSA colonization surveillance program. It is not intended to diagnose MRSA infection nor to guide or monitor treatment for MRSA infections. RESULT CALLED TO, READ BACK BY AND VERIFIED WITH: EVANS,H @ 0102 ON 01/17/18 BY JUW Performed at [facility], [address], [place], [place] [address]   Culture, Urine     Status: None   Collection Time: 01/19/18 11:04 AM  Result Value Ref Range Status   Specimen Description   Final    URINE, CLEAN  CATCH Performed at [facility], [address], [place], [place] [address]    Special Requests   Final    NONE Performed at [facility], [address], [place],  [address]    Culture   Final    Multiple bacterial morphotypes present, none predominant. Suggest appropriate recollection if clinically indicated.   Report Status 01/20/2018 FINAL  Final  Culture,  blood (Routine X 2) w Reflex to ID Panel     Status: None (Preliminary result)   Collection Time: 01/19/18 11:26 AM  Result Value Ref Range Status   Specimen Description RIGHT ANTECUBITAL  Final   Special Requests   Final    BOTTLES DRAWN AEROBIC AND ANAEROBIC Blood Culture adequate volume   Culture   Final    NO GROWTH 3 DAYS Performed at [facility], [address] [place], [place] [address]    Report Status PENDING  Incomplete  Culture, blood (Routine X 2) w Reflex to ID Panel     Status: None (Preliminary result)   Collection Time: 01/19/18 11:37 AM  Result Value Ref Range Status   Specimen Description BLOOD LEFT HAND  Final   Special Requests   Final    BOTTLES DRAWN AEROBIC AND ANAEROBIC Blood Culture adequate volume   Culture   Final    NO GROWTH 3 DAYS Performed at [facility], [address] [place], [place] [address]    Report Status PENDING  Incomplete     Scheduled Meds: . aspirin EC  81 mg Oral Daily  . Chlorhexidine Gluconate Cloth  6 each Topical Q0600  . enoxaparin (LOVENOX) injection  40 mg Subcutaneous Q24H  . feeding supplement (GLUCERNA SHAKE)  237 mL Oral BID BM  . insulin aspart  0-9 Units Subcutaneous TID WC  . insulin detemir  17 Units Subcutaneous QHS  . levothyroxine  25 mcg Oral Q0600  . mupirocin ointment  1 application Nasal BID  . pravastatin  10 mg Oral q1800   Continuous Infusions: . metronidazole 500 mg (01/22/18 1747)    Procedures/Studies: Dg Chest 2 View  Result Date: 01/03/2018 CLINICAL DATA:  Sepsis, fever, no pain EXAM: CHEST - 2 VIEW COMPARISON:  09/26/2007  FINDINGS: There is no focal parenchymal opacity. There is no pleural effusion or pneumothorax. The heart and mediastinal contours are unremarkable. There is mild osteoarthritis of bilateral glenohumeral joints, left greater than right. IMPRESSION: No active cardiopulmonary disease. Electronically Signed   By: Kathreen Devoid   On: 01/03/2018 20:23    Orson Eva, DO  [facility] Pager [phone]  If 7PM-7AM, please contact night-coverage [email or web address] Password [email or web address] 01/22/2018, 5:51 PM   LOS: 5 days

## 2018-01-22 NOTE — Care Management Important Message (Signed)
Important Message  Patient Details  Name: Anthony Dunlap MRN: [id number] Date of Birth: 08-Mar-1933   Medicare Important Message Given:  Yes    Sherald Barge, RN 01/22/2018, 1:25 PM

## 2018-01-23 ENCOUNTER — Inpatient Hospital Stay (HOSPITAL_COMMUNITY): Payer: Medicare Other

## 2018-01-23 DIAGNOSIS — K6812 Psoas muscle abscess: Secondary | ICD-10-CM

## 2018-01-23 DIAGNOSIS — I361 Nonrheumatic tricuspid (valve) insufficiency: Secondary | ICD-10-CM

## 2018-01-23 LAB — GLUCOSE, CAPILLARY
GLUCOSE-CAPILLARY: 211 mg/dL — AB (ref 70–99)
GLUCOSE-CAPILLARY: 290 mg/dL — AB (ref 70–99)
Glucose-Capillary: 198 mg/dL — ABNORMAL HIGH (ref 70–99)
Glucose-Capillary: 226 mg/dL — ABNORMAL HIGH (ref 70–99)
Glucose-Capillary: 153 mg/dL — ABNORMAL HIGH (ref 70–99)

## 2018-01-23 LAB — ECHOCARDIOGRAM COMPLETE
Height: 68 in
Weight: 2366.86 oz

## 2018-01-23 LAB — CBC
HCT: 26 % — ABNORMAL LOW (ref 39.0–52.0)
HEMOGLOBIN: 8 g/dL — AB (ref 13.0–17.0)
MCH: 24.2 pg — ABNORMAL LOW (ref 26.0–34.0)
MCHC: 30.8 g/dL (ref 30.0–36.0)
MCV: 78.8 fL — ABNORMAL LOW (ref 80.0–100.0)
Platelets: 613 10*3/uL — ABNORMAL HIGH (ref 150–400)
RBC: 3.3 MIL/uL — ABNORMAL LOW (ref 4.22–5.81)
RDW: 14.4 % (ref 11.5–15.5)
WBC: 20.2 10*3/uL — ABNORMAL HIGH (ref 4.0–10.5)
nRBC: 0 % (ref 0.0–0.2)

## 2018-01-23 LAB — BASIC METABOLIC PANEL
Anion gap: 10 (ref 5–15)
BUN: 14 mg/dL (ref 8–23)
CHLORIDE: 92 mmol/L — AB (ref 98–111)
CO2: 24 mmol/L (ref 22–32)
Calcium: 7.2 mg/dL — ABNORMAL LOW (ref 8.9–10.3)
Creatinine, Ser: 0.7 mg/dL (ref 0.61–1.24)
GFR calc Af Amer: >60 mL/min (ref >60)
GFR calc non Af Amer: >60 mL/min (ref >60)
Glucose, Bld: 250 mg/dL — ABNORMAL HIGH (ref 70–99)
Potassium: 3.2 mmol/L — ABNORMAL LOW (ref 3.5–5.1)
Sodium: 126 mmol/L — ABNORMAL LOW (ref 135–145)

## 2018-01-23 MED ORDER — VANCOMYCIN HCL IN DEXTROSE 1-5 GM/200ML-% IV SOLN
1000.0000 mg | INTRAVENOUS | Status: DC
  Administered 2018-01-23: 1000 mg via INTRAVENOUS
  Filled 2018-01-23: qty 200

## 2018-01-23 MED ORDER — POTASSIUM CHLORIDE IN NACL 20-0.9 MEQ/L-% IV SOLN
INTRAVENOUS | Status: DC
  Administered 2018-01-23 – 2018-01-25 (×3): via INTRAVENOUS
  Filled 2018-01-23 (×2): qty 1000

## 2018-01-23 MED ORDER — PIPERACILLIN-TAZOBACTAM 3.375 G IVPB
3.3750 g | Freq: Three times a day (TID) | INTRAVENOUS | Status: DC
  Administered 2018-01-23 – 2018-01-25 (×8): 3.375 g via INTRAVENOUS
  Filled 2018-01-23 (×7): qty 50

## 2018-01-23 MED ORDER — VANCOMYCIN HCL IN DEXTROSE 1-5 GM/200ML-% IV SOLN
1000.0000 mg | Freq: Once | INTRAVENOUS | Status: AC
  Administered 2018-01-23: 1000 mg via INTRAVENOUS

## 2018-01-23 NOTE — Progress Notes (Signed)
PROGRESS NOTE  Anthony Dunlap FVC:[id number] DOB: 20-Jun-1933 DOA: 01/17/2018 PCP: [name], MD  Brief History:  84 y.o.malewith medical history significant forhypothyroidism, insulin-dependent diabetes mellitus, hyperlipidemia, and history of prostate cancer with chronic Foley catheter, now presenting to the emergency department for evaluation of rigors. Patient reports that he was in his usual state of health until overnight when he began sweating and developed shaking chills. In the ED, pt was febrile with HR in 140s.  He also had serum glucose of 440 with anion gap of 12.  He was initially started on IVF and IV insulin.  He denies any recent cough, shortness of breath, chest pain, vomiting, or diarrhea. Patient was admitted for sepsis due to presumptive UTI initially.  Urine culture was not revealing.  Blood cultures grew peptostreptococcus and Bacteroides thetaiodomicron.  Antibiotics were adjusted.  Assessment/Plan: Sepsis -due to peptostreptococcus and bacteroides bacteremia and intramuscular abscesses - repeat blood cultures remain neg -urine cultures with multiple organisms -afebrile x 24 hours now  Bacteremia--Peptostreptococcus and Bacteroides -start flagl IV-->defervesced -d/c PCN -vanco/zosyn was started by Dr. Olevia Bowens -repeat blood cultures neg -suspected intraabdominal source--> -CT abd/pelvis--numerous intramuscular abscesses as stated above along the course of the distal right rectus, about the bilateral pectineus, obturator externus and left quadratus femoris muscles as well as left ileo psoas;  Small abscess arising off the pubic symphysis is also noted with subtle bone loss and cortical bone destruction raising concern for septic arthritis and osteomyelitis of the pubic bone -01/19/2018 echo EF 60 to 65%, no WMA, grade 1 DD, nodular thickening of the tricuspid valve.  Mild TR. -May need TEE  Pelvic soft tissue&muscle Abscess/Iliopsoas  abscess -01/22/18 CT pelvis--as discussed above -I have consulted ortho--Dr. Erlinda Hong -may need IR drainage if not surgical candidate -ID consult -continue flagyl-->defervesced  DKA type 2 --patient started on IV insulin with q 1 hour CBG check and q 4 hour BMPs -pt started on aggressive fluid resuscitation -Electrolytes were monitored and repleted -transitioned to [place] insulin once anion gap closed -diet was advanced once anion gap closed -HbA1C--9.3 -increase levemir 20 units -increase to moderate SSI  Hypothyroidism -continue synthroid  Hyperlipidemia -continue statin  Indwelling foley catheter -due to have it exchanged some time in December      Disposition Plan:   [facility] to [facility] Family Communication:   Daughter updated on phone  Consultants:  Ortho--Dr. Erlinda Hong; ID--Hatcher  Code Status:  FULL  DVT Prophylaxis:  [place] Lovenox   Procedures: As Listed in Progress Note Above  Antibiotics: Flagyl 12/4>>> Pen G 12/3>>>12/4 Cefazolin 12/1>>12/3 Cefepime 11/29>>>12/1 Vanco/zosyn 12/5>>>     Subjective: Patient complains of pain in the left thigh and left leg.  He states that he has had injections in his left hip for arthritis.  He states that he has had pain in his left leg and thigh for couple months.  He denies any nausea, vomiting, diarrhea, chest pain, shortness breath, hemoptysis.  Denies any headache or neck pain.  Objective: Vitals:   01/23/18 0523 01/23/18 0745 01/23/18 1125 01/23/18 1614  BP: 116/62 129/71 123/68 119/71  Pulse: 95 97 98 (!) 106  Resp: 17 18 17 20   Temp: 99.2 F (37.3 C) 99.5 F (37.5 C) 98 F (36.7 C) 98.6 F (37 C)  TempSrc: Axillary Axillary Axillary   SpO2: 95% 95% 95% 96%  Weight:      Height:        Intake/Output Summary (Last 24  hours) at 01/23/2018 1740 Last data filed at 01/23/2018 1622 Gross per 24 hour  Intake 1770.44 ml  Output 1600 ml  Net 170.44 ml   Weight change:  Exam:   General:  Pt is  alert, follows commands appropriately, not in acute distress  HEENT: No icterus, No thrush, No neck mass, [place]/AT  Cardiovascular: RRR, S1/S2, no rubs, no gallops  Respiratory: Fine bibasilar rales but no wheezing.  Good air movement.  Abdomen: Soft/+BS, non tender, non distended, no guarding; no supra pubic tenderness  Extremities: 1+ lower extremity edema, No lymphangitis, No petechiae, No rashes, no synovitis; tenderness to palpation of the bilateral medial and lateral thighs, left greater than right.  There is no crepitance or open draining wounds.  There is no erythema.   Data Reviewed: I have personally reviewed following labs and imaging studies Basic Metabolic Panel: Recent Labs  Lab 01/17/18 0744 01/18/18 0417 01/20/18 0547 01/22/18 0548 01/23/18 0520  NA 138 135 132* 133* 126*  K 3.9 3.7 3.5 3.4* 3.2*  CL 108 104 100 99 92*  CO2 21* 22 25 26 24   GLUCOSE 129* 104* 92 78 250*  BUN 17 13 13 15 14   CREATININE 0.82 0.76 0.77 0.77 0.70  CALCIUM 8.2* 8.1* 7.8* 7.9* 7.2*   Liver Function Tests: Recent Labs  Lab 01/17/18 0237  AST 19  ALT 15  ALKPHOS 152*  BILITOT 0.7  PROT 7.2  ALBUMIN 2.5*   No results for input(s): LIPASE, AMYLASE in the last 168 hours. No results for input(s): AMMONIA in the last 168 hours. Coagulation Profile: Recent Labs  Lab 01/17/18 0443  INR 1.31   CBC: Recent Labs  Lab 01/17/18 0237 01/18/18 0417 01/20/18 0547 01/22/18 0548 01/23/18 0520  WBC 20.8* 12.6* 16.0* 16.8* 20.2*  HGB 8.7* 8.7* 8.0* 8.5* 8.0*  HCT 28.4* 29.1* 26.2* 28.0* 26.0*  MCV 81.8 83.4 81.4 80.0 78.8*  PLT 649* 681* 549* 573* 613*   Cardiac Enzymes: No results for input(s): CKTOTAL, CKMB, CKMBINDEX, TROPONINI in the last 168 hours. BNP: Invalid input(s): POCBNP CBG: Recent Labs  Lab 01/22/18 1651 01/22/18 2248 01/23/18 0749 01/23/18 1137 01/23/18 1653  GLUCAP 202* 158* 198* 226* 290*   HbA1C: No results for input(s): HGBA1C in the last 72  hours. Urine analysis:    Component Value Date/Time   COLORURINE YELLOW 01/17/2018 0237   APPEARANCEUR CLOUDY (A) 01/17/2018 0237   LABSPEC 1.022 01/17/2018 0237   PHURINE 5.0 01/17/2018 0237   GLUCOSEU >=500 (A) 01/17/2018 0237   HGBUR SMALL (A) 01/17/2018 0237   BILIRUBINUR NEGATIVE 01/17/2018 0237   KETONESUR 5 (A) 01/17/2018 0237   PROTEINUR 30 (A) 01/17/2018 0237   UROBILINOGEN 0.2 09/03/2013 0132   NITRITE POSITIVE (A) 01/17/2018 0237   LEUKOCYTESUR LARGE (A) 01/17/2018 0237   Sepsis Labs: @LABRCNTIP (procalcitonin:4,lacticidven:4) ) Recent Results (from the past 240 hour(s))  Urine culture     Status: Abnormal   Collection Time: 01/17/18  2:23 AM  Result Value Ref Range Status   Specimen Description   Final    URINE, CATHETERIZED Performed at [facility], [address] [place], [place] [address]    Special Requests   Final    Normal Performed at [facility], [address], [place], [place] [address]    Culture MULTIPLE SPECIES PRESENT, SUGGEST RECOLLECTION (A)  Final   Report Status 01/18/2018 FINAL  Final  Blood Culture (routine x 2)     Status: Abnormal   Collection Time: 01/17/18  2:40 AM  Result Value Ref Range Status   Specimen Description   Final    BLOOD RIGHT FOREARM Performed at [facility], [address], [place], [place] [address]    Special Requests   Final    BOTTLES DRAWN AEROBIC AND ANAEROBIC Blood Culture results may not be optimal due to an excessive volume of blood received in culture bottles Performed at [place] [place], [place] [address]    Culture  Setup Time   Final    GRAM POSITIVE COCCI ANAEROBIC BOTTLE Gram Stain Report Called to,Read Back By and Verified With: HYATT,L@1344  BY MATTHEWS,B 11.30.19 [facility] Organism ID to follow CRITICAL RESULT CALLED TO, READ BACK BY AND VERIFIED WITH: Cletis Media RN [id number] BY GF    Culture (A)  Final    PEPTOSTREPTOCOCCUS ASACCHAROLYTICUS BACTEROIDES  THETAIOTAOMICRON BETA LACTAMASE POSITIVE Performed at [facility], [address], [place], [place] [address]    Report Status 01/22/2018 FINAL  Final  Blood Culture ID Panel (Reflexed)     Status: None   Collection Time: 01/17/18  2:40 AM  Result Value Ref Range Status   Enterococcus species NOT DETECTED NOT DETECTED Final   Listeria monocytogenes NOT DETECTED NOT DETECTED Final   Staphylococcus species NOT DETECTED NOT DETECTED Final   Staphylococcus aureus (BCID) NOT DETECTED NOT DETECTED Final   Streptococcus species NOT DETECTED NOT DETECTED Final   Streptococcus agalactiae NOT DETECTED NOT DETECTED Final   Streptococcus pneumoniae NOT DETECTED NOT DETECTED Final   Streptococcus pyogenes NOT DETECTED NOT DETECTED Final   Acinetobacter baumannii NOT DETECTED NOT DETECTED Final   Enterobacteriaceae species NOT DETECTED NOT DETECTED Final   Enterobacter cloacae complex NOT DETECTED NOT DETECTED Final   Escherichia coli NOT DETECTED NOT DETECTED Final   Klebsiella oxytoca NOT DETECTED NOT DETECTED Final   Klebsiella pneumoniae NOT DETECTED NOT DETECTED Final   Proteus species NOT DETECTED NOT DETECTED Final   Serratia marcescens NOT DETECTED NOT DETECTED Final   Haemophilus influenzae NOT DETECTED NOT DETECTED Final   Neisseria meningitidis NOT DETECTED NOT DETECTED Final   Pseudomonas aeruginosa NOT DETECTED NOT DETECTED Final   Candida albicans NOT DETECTED NOT DETECTED Final   Candida glabrata NOT DETECTED NOT DETECTED Final   Candida krusei NOT DETECTED NOT DETECTED Final   Candida parapsilosis NOT DETECTED NOT DETECTED Final   Candida tropicalis NOT DETECTED NOT DETECTED Final    Comment: Performed at [facility], [place] [address] [place], [place] [address]  Blood Culture (routine x 2)     Status: Abnormal   Collection Time: 01/17/18  2:59 AM  Result Value Ref Range Status   Specimen Description   Final    BLOOD LEFT ANTECUBITAL Performed at [facility], [address], [place], [place] [address]    Special Requests   Final    BOTTLES DRAWN AEROBIC AND ANAEROBIC Blood Culture adequate volume Performed at [facility], [address] [place], [place] [address]    Culture  Setup Time   Final    GRAM POSITIVE COCCI Gram Stain Report Called to,Read Back By and Verified With: MOTLEY @ 2585 ON [id number] BY HENDERSON L. Performed at [facility], [address], [place], [place] [address]    Culture (A)  Final    PEPTOSTREPTOCOCCUS ASACCHAROLYTICUS ANAEROBIC GRAM NEGATIVE ROD BETA LACTAMASE POSITIVE Performed at [facility], [place] [address], [place], [place] [address]    Report Status 01/22/2018 FINAL  Final  MRSA PCR Screening     Status: Abnormal   Collection Time: 01/17/18  5:56 AM  Result Value Ref Range Status   MRSA by PCR POSITIVE (A) NEGATIVE Final    Comment:        The GeneXpert MRSA Assay (FDA approved for NASAL specimens only), is one component of a comprehensive MRSA colonization surveillance program. It is not intended to diagnose MRSA infection nor to guide or monitor treatment for MRSA infections. RESULT CALLED TO, READ BACK BY AND VERIFIED WITH: EVANS,H @ 6063 ON 01/17/18 BY JUW Performed at [facility], [address], [place], [place] [address]   Culture, Urine     Status: None   Collection Time: 01/19/18 11:04 AM  Result Value Ref Range Status   Specimen Description   Final    URINE, CLEAN CATCH Performed at [facility], [address], [place], [place] [address]    Special Requests   Final    NONE Performed at [facility], [address], [place], [place] [address]    Culture   Final    Multiple bacterial morphotypes present, none predominant. Suggest appropriate recollection if clinically indicated.   Report Status 01/20/2018 FINAL  Final  Culture, blood (Routine X 2) w Reflex to ID Panel     Status: None (Preliminary result)   Collection Time: 01/19/18 11:26 AM  Result Value Ref Range Status    Specimen Description RIGHT ANTECUBITAL  Final   Special Requests   Final    BOTTLES DRAWN AEROBIC AND ANAEROBIC Blood Culture adequate volume   Culture   Final    NO GROWTH 4 DAYS Performed at [facility], [address], [place], [name] [address]    Report Status PENDING  Incomplete  Culture, blood (Routine X 2) w Reflex to ID Panel     Status: None (Preliminary result)   Collection Time: 01/19/18 11:37 AM  Result Value Ref Range Status   Specimen Description BLOOD LEFT HAND  Final   Special Requests   Final    BOTTLES DRAWN AEROBIC AND ANAEROBIC Blood Culture adequate volume   Culture   Final    NO GROWTH 4 DAYS Performed at [facility], [address], [place], [place] [address]    Report Status PENDING  Incomplete     Scheduled Meds: . aspirin EC  81 mg Oral Daily  . Chlorhexidine Gluconate Cloth  6 each Topical Q0600  . enoxaparin (LOVENOX) injection  40 mg Subcutaneous Q24H  . feeding supplement (GLUCERNA SHAKE)  237 mL Oral BID BM  . insulin aspart  0-9 Units Subcutaneous TID WC  . insulin detemir  17 Units Subcutaneous QHS  . levothyroxine  25 mcg Oral Q0600  . mupirocin ointment  1 application Nasal BID  . pravastatin  10 mg Oral q1800   Continuous Infusions: . piperacillin-tazobactam (ZOSYN)  IV 3.375 g (01/23/18 0908)  . vancomycin      Procedures/Studies: Dg Chest 2 View  Result Date: 01/03/2018 CLINICAL DATA:  Sepsis, fever, no pain EXAM: CHEST - 2 VIEW COMPARISON:  09/26/2007 FINDINGS: There is no focal parenchymal opacity. There is no pleural effusion or pneumothorax. The heart and mediastinal contours are unremarkable. There is mild osteoarthritis of bilateral glenohumeral joints, left greater than right. IMPRESSION: No active cardiopulmonary disease. Electronically Signed   By: Kathreen Devoid   On: 01/03/2018 20:23   Ct Abdomen Pelvis W Contrast  Result Date: 01/22/2018 CLINICAL DATA:  84 year old male presents with fever and tachycardia with anaerobic  bacteremia. Abdominal pain and fever with abscess suspected. EXAM: CT ABDOMEN AND PELVIS WITH CONTRAST TECHNIQUE: Multidetector CT imaging of the abdomen and pelvis was performed using the standard protocol following bolus administration of intravenous contrast. CONTRAST:  124mL ISOVUE-300 IOPAMIDOL (ISOVUE-300) INJECTION 61% COMPARISON:  None. FINDINGS: Lower chest: Heart size is top normal without pericardial effusion. Trace bilateral pleural effusions with adjacent atelectasis are identified at each lung base, left slightly greater than right. Hepatobiliary: 8 mm hypodensity in the subcapsular right hepatic lobe, too small to characterize but may reflect a small cyst or hemangioma. No enhancing mass or biliary dilatation. No abscess. The gallbladder is unremarkable and free of stones. Pancreas: Atrophic pancreas. No mass or ductal dilatation. Inflammation. Spleen: Normal size spleen without focal mass. Adrenals/Urinary Tract: Normal bilateral adrenal glands. Symmetric cortical enhancement both kidneys with bilateral renal cysts. No nephrolithiasis nor hydroureteronephrosis. The urinary bladder is decompressed by Foley catheter. Stomach/Bowel: Small hiatal hernia is noted. The stomach is nondistended. Small bowel rotation is within limits. Enteric contrast is seen within jejunal loops without mechanical bowel obstruction or inflammation. Moderate stool retention is seen within the cecum and ascending colon with moderate stool also noted in the descending colon. No mural thickening is identified. The rectosigmoid is decompressed in appearance and likely explains the slightly thickened appearance. The appendix is not well visualized. Vascular/Lymphatic: Mild scattered aortoiliac atherosclerosis without aneurysm. Mild reactive adenopathy suspected of the window nodes without significant pathologic enlargement. The largest contains fat and measure up to 12 mm on right and 10 mm on the left. Reproductive: Brachy  therapy seeds are imbedded within the prostate. Other: Soft tissue abscesses are identified about the pelvis and left hip. Mottled gas like lucency with low attenuating fluid is noted along the course of the distal right rectus muscle measuring 7.4 x 2.5 x 3.2 cm, series 6/49 and series 2/76. Additional tracking low-density fluid with mottled gas like lucencies are noted along the course of the left iliopsoas muscle extending to the left hip. Intramuscular abscesses are noted of the pectineus and obturator externus muscles bilaterally, left more prominent than right as well as left quadratus femoris muscle. Soft tissue abscess contiguous in adjacent to pubic symphysis is identified, series 2/81 measuring 3.3 x 4.4 x 4.1 cm. Soft tissue anasarca is noted of the included abdomen pelvis. Small low-density subcutaneous fluid collections are noted along the lateral aspect of both hips without gas like lucencies, right measuring at least 3.7 x 3 cm and on the left 5.5 by 1.9 cm, series 2/86. Musculoskeletal: Subtle cortical bone loss and osteopenia noted along the inferior pubic bone bilaterally. Changes of osteomyelitis is of concern, series 2/87 on bone windows possibly from septic arthritis given presence of adjacent abscess. IMPRESSION: 1. The over arching finding is that of numerous intramuscular abscesses as stated above along the course of the distal right rectus, about the bilateral pectineus, obturator externus and left quadratus femoris muscles as well as left ileo psoas. 2. Small abscess arising off the pubic symphysis is also noted with subtle bone loss and cortical bone destruction raising concern for septic arthritis and osteomyelitis of the pubic bone. 3. Left greater than right small pleural effusions with atelectasis. 4. Bilateral renal cysts. These results were called by telephone at the time of interpretation on 01/22/2018 at 11:04 pm to NP Chaney Malling, who verbally acknowledged these results.  Electronically Signed   By: Ashley Royalty M.D.   On: 01/22/2018 23:04    Orson Eva, DO  [facility] Pager [phone]  If 7PM-7AM, please contact night-coverage [email or web address] Password [email or web address] 01/23/2018, 5:40 PM   LOS: 6 days

## 2018-01-23 NOTE — Progress Notes (Signed)
Pharmacy Antibiotic Note  Anthony Dunlap is a 84 y.o. male admitted on 01/17/2018 with intra-abdominal infection/ osteomyelitis.  Pharmacy has been consulted for Vancomycin and Zosyn dosing.  Plan: Vancomycin 1000 mg IV every 24 hours.  Goal trough 15-20 mcg/mL.  Zosyn 3.375g IV every 8 hours. Monitor labs, c/s, and vanco trough as indicated.  Height: 5\' 8"  (172.7 cm) Weight: 147 lb 14.9 oz (67.1 kg) IBW/kg (Calculated) : 68.4  Temp (24hrs), Avg:99.5 F (37.5 C), Min:98.8 F (37.1 C), Max:100 F (37.8 C)  Recent Labs  Lab 01/17/18 0237 01/17/18 0433  01/17/18 0744 01/17/18 1058 01/18/18 0417 01/20/18 0547 01/22/18 0548 01/23/18 0520  WBC 20.8*  --   --   --   --  12.6* 16.0* 16.8* 20.2*  CREATININE 1.07  --    < > 0.82  --  0.76 0.77 0.77 0.70  LATICACIDVEN 2.6* 1.3  --  2.1* 1.0  --   --   --   --    < > = values in this interval not displayed.    Estimated Creatinine Clearance: 67.6 mL/min (by C-G formula based on SCr of 0.7 mg/dL).    No Known Allergies  Antimicrobials this admission: Vanco 12/5 >>  Zosyn 12/5 >>  Cefepime 11/29 >> 12/1 Cefazolin 12/1>> 12/3 PCN G 12/3>> 12/04  Flagyl 12/4 >>12/5  Dose adjustments this admission: N/A  Microbiology results: 12/1 BCx: ngtd 11/29 BCx: peptostreptococcus-  Beta lactamase positive 12/1 UCx: ng  11/29 MRSA PCR: positive  Thank you for allowing pharmacy to be a part of this patient's care.  Ramond Craver 01/23/2018 8:18 AM

## 2018-01-23 NOTE — Progress Notes (Signed)
Report provided to [facility]. Pt aware of transport. fsbs obtained and levemir given per orders. Snack provided. Will continue to monitor.

## 2018-01-23 NOTE — Progress Notes (Signed)
Report given to Ann on 6N. Pt aware of transfer. Awaiting [facility] arrival. Pt recent temp 101.1 will medicate for fever prior to transfer. Will continue to monitor.

## 2018-01-23 NOTE — Progress Notes (Signed)
*  PRELIMINARY RESULTS* Echocardiogram 2D Echocardiogram has been performed.  Anthony Dunlap 01/23/2018, 1:56 PM

## 2018-01-23 NOTE — Progress Notes (Signed)
[facility] arrived for transport. Attempted to stand pt from bed and ambulate to stretcher and pt does not have the strength. Returned to bed. Cleaned at this time r/t bowel incontinence. Then transferred to stretcher by sliding with 3 assist. Pt tolerated well. IV abx in progress. Care relinquished to [facility] at this time. Pt has no belongings with him as family received them earlier today.

## 2018-01-23 NOTE — Progress Notes (Signed)
ANTIBIOTIC CONSULT NOTE-Preliminary  Pharmacy Consult for vancomycin, zosyn Indication: intra-abdominal infection, abscesses, septic arthritis  No Known Allergies  Patient Measurements: Height: 5\' 8"  (172.7 cm) Weight: 147 lb 14.9 oz (67.1 kg) IBW/kg (Calculated) : 68.4 Adjusted Body Weight:   Vital Signs: Temp: 99.2 F (37.3 C) (12/05 0523) Temp Source: Axillary (12/05 0523) BP: 116/62 (12/05 0523) Pulse Rate: 95 (12/05 0523)  Labs: Recent Labs    01/20/18 0547 01/22/18 0548  WBC 16.0* 16.8*  HGB 8.0* 8.5*  PLT 549* 573*  CREATININE 0.77 0.77    Estimated Creatinine Clearance: 67.6 mL/min (by C-G formula based on SCr of 0.77 mg/dL).  No results for input(s): VANCOTROUGH, VANCOPEAK, VANCORANDOM, GENTTROUGH, GENTPEAK, GENTRANDOM, TOBRATROUGH, TOBRAPEAK, TOBRARND, AMIKACINPEAK, AMIKACINTROU, AMIKACIN in the last 72 hours.   Microbiology: Recent Results (from the past 720 hour(s))  Culture, blood (Routine x 2)     Status: None   Collection Time: 01/03/18  7:30 PM  Result Value Ref Range Status   Specimen Description RIGHT ANTECUBITAL  Final   Special Requests   Final    BOTTLES DRAWN AEROBIC AND ANAEROBIC Blood Culture adequate volume   Culture   Final    NO GROWTH 5 DAYS Performed at [facility], [address], [place], [place] [address]    Report Status 01/08/2018 FINAL  Final  Culture, blood (Routine x 2)     Status: Abnormal   Collection Time: 01/03/18  7:30 PM  Result Value Ref Range Status   Specimen Description   Final    LEFT ANTECUBITAL Performed at [facility], [address] [place], [place] [address]    Special Requests   Final    BOTTLES DRAWN AEROBIC AND ANAEROBIC Blood Culture adequate volume Performed at [facility], [address] [place], [place] [address]    Culture  Setup Time   Final    GRAM POSITIVE COCCI Gram Stain Report Called to,Read Back By and Verified With: MAYS,J. AT 1606 ON 01/04/2018 BY EVA ANAEROBIC BOTTLE ONLY Performed at  [facility] TO, READ BACK BY AND VERIFIED WITHMarijo Conception RN [phone] BY GF GRAM POSITIVE COCCI AEROBIC Gram Stain Report Called to,Read Back By and Verified With: TAYLOR M. AT 1250 ON [id number] BY THOMPSON S. Performed at [facility], [address] [place], [place] [address]    Culture (A)  Final    STAPHYLOCOCCUS SPECIES (COAGULASE NEGATIVE) THE SIGNIFICANCE OF ISOLATING THIS ORGANISM FROM A SINGLE SET OF BLOOD CULTURES WHEN MULTIPLE SETS ARE DRAWN IS UNCERTAIN. PLEASE NOTIFY THE MICROBIOLOGY DEPARTMENT WITHIN ONE WEEK IF SPECIATION AND SENSITIVITIES ARE REQUIRED. Performed at [facility], [place] [address] [place],  [address]    Report Status 01/06/2018 FINAL  Final  Blood Culture ID Panel (Reflexed)     Status: Abnormal   Collection Time: 01/03/18  7:30 PM  Result Value Ref Range Status   Enterococcus species NOT DETECTED NOT DETECTED Final   Listeria monocytogenes NOT DETECTED NOT DETECTED Final   Staphylococcus species DETECTED (A) NOT DETECTED Final    Comment: Methicillin (oxacillin) susceptible coagulase negative staphylococcus. Possible blood culture contaminant (unless isolated from more than one blood culture draw or clinical case suggests pathogenicity). No antibiotic treatment is indicated for blood  culture contaminants. CRITICAL RESULT CALLED TO, READ BACK BY AND VERIFIED WITHMarijo Conception RN [phone] BY GF    Staphylococcus aureus (BCID) NOT DETECTED NOT DETECTED Final   Methicillin resistance NOT  DETECTED NOT DETECTED Final   Streptococcus species NOT DETECTED NOT DETECTED Final   Streptococcus agalactiae NOT DETECTED NOT DETECTED Final   Streptococcus pneumoniae NOT DETECTED NOT DETECTED Final   Streptococcus pyogenes NOT DETECTED NOT DETECTED Final   Acinetobacter baumannii NOT DETECTED NOT DETECTED Final   Enterobacteriaceae species NOT DETECTED NOT DETECTED Final   Enterobacter  cloacae complex NOT DETECTED NOT DETECTED Final   Escherichia coli NOT DETECTED NOT DETECTED Final   Klebsiella oxytoca NOT DETECTED NOT DETECTED Final   Klebsiella pneumoniae NOT DETECTED NOT DETECTED Final   Proteus species NOT DETECTED NOT DETECTED Final   Serratia marcescens NOT DETECTED NOT DETECTED Final   Haemophilus influenzae NOT DETECTED NOT DETECTED Final   Neisseria meningitidis NOT DETECTED NOT DETECTED Final   Pseudomonas aeruginosa NOT DETECTED NOT DETECTED Final   Candida albicans NOT DETECTED NOT DETECTED Final   Candida glabrata NOT DETECTED NOT DETECTED Final   Candida krusei NOT DETECTED NOT DETECTED Final   Candida parapsilosis NOT DETECTED NOT DETECTED Final   Candida tropicalis NOT DETECTED NOT DETECTED Final    Comment: Performed at [facility], [place] [address], [place], [place] [address]  Urine Culture     Status: None   Collection Time: 01/03/18  8:40 PM  Result Value Ref Range Status   Specimen Description   Final    URINE, CLEAN CATCH Performed at [facility], [address], [place], [place] [address]    Special Requests   Final    NONE Performed at [facility], [address], [place], [place] [address]    Culture   Final    Multiple bacterial morphotypes present, none predominant. Suggest appropriate recollection if clinically indicated.   Report Status 01/05/2018 FINAL  Final  Urine culture     Status: Abnormal   Collection Time: 01/17/18  2:23 AM  Result Value Ref Range Status   Specimen Description   Final    URINE, CATHETERIZED Performed at [facility], [address] [place], [place] [address]    Special Requests   Final    Normal Performed at [facility], [address] [place], [place] [address]    Culture MULTIPLE SPECIES PRESENT, SUGGEST RECOLLECTION (A)  Final   Report Status 01/18/2018 FINAL  Final  Blood Culture (routine x 2)     Status: Abnormal   Collection Time: 01/17/18  2:40 AM  Result Value Ref Range Status    Specimen Description   Final    BLOOD RIGHT FOREARM Performed at [facility], [address] [place], [place] [address]    Special Requests   Final    BOTTLES DRAWN AEROBIC AND ANAEROBIC Blood Culture results may not be optimal due to an excessive volume of blood received in culture bottles Performed at [address] [place], [place] [address]    Culture  Setup Time   Final    GRAM POSITIVE COCCI ANAEROBIC BOTTLE Gram Stain Report Called to,Read Back By and Verified With: HYATT,L@1344  BY MATTHEWS,B 11.30.19 [facility] Organism ID to follow CRITICAL RESULT CALLED TO, READ BACK BY AND VERIFIED WITH: Cletis Media RN [id number] BY GF    Culture (A)  Final    PEPTOSTREPTOCOCCUS ASACCHAROLYTICUS BACTEROIDES THETAIOTAOMICRON BETA LACTAMASE POSITIVE Performed at [facility], [place] [address], [place], [place] [address]    Report Status 01/22/2018 FINAL  Final  Blood Culture ID Panel (Reflexed)     Status: None   Collection Time: 01/17/18  2:40 AM  Result Value Ref Range Status   Enterococcus species NOT DETECTED NOT DETECTED Final   Listeria monocytogenes NOT DETECTED NOT DETECTED Final   Staphylococcus species NOT DETECTED NOT DETECTED Final   Staphylococcus aureus (BCID) NOT DETECTED NOT DETECTED Final   Streptococcus species NOT DETECTED NOT DETECTED Final   Streptococcus agalactiae NOT DETECTED NOT DETECTED Final   Streptococcus pneumoniae NOT DETECTED NOT DETECTED Final   Streptococcus pyogenes NOT DETECTED NOT DETECTED Final   Acinetobacter baumannii NOT DETECTED NOT DETECTED Final   Enterobacteriaceae species NOT DETECTED NOT DETECTED Final   Enterobacter cloacae complex NOT DETECTED NOT DETECTED Final   Escherichia coli NOT DETECTED NOT DETECTED Final   Klebsiella oxytoca NOT DETECTED NOT DETECTED Final   Klebsiella pneumoniae NOT DETECTED NOT DETECTED Final   Proteus species NOT DETECTED NOT DETECTED Final   Serratia marcescens NOT DETECTED NOT  DETECTED Final   Haemophilus influenzae NOT DETECTED NOT DETECTED Final   Neisseria meningitidis NOT DETECTED NOT DETECTED Final   Pseudomonas aeruginosa NOT DETECTED NOT DETECTED Final   Candida albicans NOT DETECTED NOT DETECTED Final   Candida glabrata NOT DETECTED NOT DETECTED Final   Candida krusei NOT DETECTED NOT DETECTED Final   Candida parapsilosis NOT DETECTED NOT DETECTED Final   Candida tropicalis NOT DETECTED NOT DETECTED Final    Comment: Performed at [facility], [address] [place], [place] [address]  Blood Culture (routine x 2)     Status: Abnormal   Collection Time: 01/17/18  2:59 AM  Result Value Ref Range Status   Specimen Description   Final    BLOOD LEFT ANTECUBITAL Performed at [facility], [address] [place], [place] [address]    Special Requests   Final    BOTTLES DRAWN AEROBIC AND ANAEROBIC Blood Culture adequate volume Performed at [facility], [address] [place], [place] [address]    Culture  Setup Time   Final    GRAM POSITIVE COCCI Gram Stain Report Called to,Read Back By and Verified With: MOTLEY @ 9030 ON [id number] BY HENDERSON L. Performed at [facility], [address], [place], [place] [address]    Culture (A)  Final    PEPTOSTREPTOCOCCUS ASACCHAROLYTICUS ANAEROBIC GRAM NEGATIVE ROD BETA LACTAMASE POSITIVE Performed at [facility], [place] [address] [place], [place] [address]    Report Status 01/22/2018 FINAL  Final  MRSA PCR Screening     Status: Abnormal   Collection Time: 01/17/18  5:56 AM  Result Value Ref Range Status   MRSA by PCR POSITIVE (A) NEGATIVE Final    Comment:        The GeneXpert MRSA Assay (FDA approved for NASAL specimens only), is one component of a comprehensive MRSA colonization surveillance program. It is not intended to diagnose MRSA infection nor to guide or monitor treatment for MRSA infections. RESULT CALLED TO, READ BACK BY AND VERIFIED WITH: EVANS,H @ 6256 ON 01/17/18 BY  JUW Performed at [facility], [address], [place], [place] [address]   Culture, Urine     Status: None   Collection Time: 01/19/18 11:04 AM  Result Value Ref Range Status   Specimen Description   Final    URINE, CLEAN CATCH Performed at [facility], [address], [place], [place] [address]    Special Requests   Final    NONE Performed at [facility], [address] [place], [place] [address]    Culture   Final  Multiple bacterial morphotypes present, none predominant. Suggest appropriate recollection if clinically indicated.   Report Status 01/20/2018 FINAL  Final  Culture, blood (Routine X 2) w Reflex to ID Panel     Status: None (Preliminary result)   Collection Time: 01/19/18 11:26 AM  Result Value Ref Range Status   Specimen Description RIGHT ANTECUBITAL  Final   Special Requests   Final    BOTTLES DRAWN AEROBIC AND ANAEROBIC Blood Culture adequate volume   Culture   Final    NO GROWTH 3 DAYS Performed at [facility], [address], [place], [place] [address]    Report Status PENDING  Incomplete  Culture, blood (Routine X 2) w Reflex to ID Panel     Status: None (Preliminary result)   Collection Time: 01/19/18 11:37 AM  Result Value Ref Range Status   Specimen Description BLOOD LEFT HAND  Final   Special Requests   Final    BOTTLES DRAWN AEROBIC AND ANAEROBIC Blood Culture adequate volume   Culture   Final    NO GROWTH 3 DAYS Performed at [facility], [address], [place], [place] [address]    Report Status PENDING  Incomplete    Medical History: Past Medical History:  Diagnosis Date  . Diabetes ([place])   . Left eye pain   . Prostate cancer ([place])     Medications:  Infusions:  . piperacillin-tazobactam (ZOSYN)  IV 12.5 mL/hr at 01/23/18 0300   Anti-infectives (From admission, onward)   Start     Dose/Rate Route Frequency Ordered Stop   01/23/18 0100  piperacillin-tazobactam (ZOSYN) IVPB 3.375 g     3.375 g 12.5 mL/hr over 240 Minutes Intravenous  Every 8 hours 01/23/18 0057     01/23/18 0100  vancomycin (VANCOCIN) IVPB 1000 mg/200 mL premix     1,000 mg 200 mL/hr over 60 Minutes Intravenous  Once 01/23/18 0057 01/23/18 0214   01/22/18 1630  meropenem (MERREM) 1 g in sodium chloride 0.9 % 100 mL IVPB  Status:  Discontinued     1 g 200 mL/hr over 30 Minutes Intravenous Every 8 hours 01/22/18 1625 01/22/18 1626   01/22/18 1630  metroNIDAZOLE (FLAGYL) IVPB 500 mg  Status:  Discontinued     500 mg 100 mL/hr over 60 Minutes Intravenous Every 8 hours 01/22/18 1626 01/23/18 0028   01/21/18 1200  penicillin G 3 million units in sodium chloride 0.9% 100 mL IVPB  Status:  Discontinued     3 Million Units 200 mL/hr over 30 Minutes Intravenous Every 4 hours 01/21/18 1050 01/21/18 1100   01/21/18 1200  penicillin G potassium 3 Million Units in dextrose 36mL IVPB  Status:  Discontinued     3 Million Units 100 mL/hr over 30 Minutes Intravenous Every 4 hours 01/21/18 1100 01/21/18 1106   01/21/18 1200  penicillin G 3 million units in sodium chloride 0.9% 100 mL IVPB  Status:  Discontinued     3 Million Units 200 mL/hr over 30 Minutes Intravenous Every 4 hours 01/21/18 1106 01/22/18 1625   01/19/18 1400  ceFAZolin (ANCEF) IVPB 1 g/50 mL premix  Status:  Discontinued     1 g 100 mL/hr over 30 Minutes Intravenous Every 8 hours 01/19/18 1104 01/21/18 1050   01/17/18 1200  ceFEPIme (MAXIPIME) 1 g in sodium chloride 0.9 % 100 mL IVPB  Status:  Discontinued     1 g 200 mL/hr over 30 Minutes Intravenous Every 12 hours 01/17/18 0741 01/19/18 1104   01/17/18 0230  ceFEPIme (MAXIPIME) 2 g in sodium chloride 0.9 % 100 mL IVPB     2 g 200 mL/hr over 30 Minutes Intravenous  Once 01/17/18 0224 01/17/18 0400      Assessment: 84 yo male presented to ED with rigors, found to be febrile and tachycardic with BG 440. Starting vancomycin and zosyn for sepsis of intraabdominal source.    Goal of Therapy:  Vancomycin trough level 15-20 mcg/ml  Plan:   Preliminary review of pertinent patient information completed.  Protocol will be initiated with dose(s) of zosyn 3.375 grams Q8 hours, vancomycin 1000 mg IV x 1.  Forestine Na clinical pharmacist will complete review during morning rounds to assess patient and finalize treatment regimen if needed.  Sharifa Bucholz Scarlett, RPH 01/23/2018,5:34 AM

## 2018-01-24 DIAGNOSIS — K6812 Psoas muscle abscess: Secondary | ICD-10-CM

## 2018-01-24 LAB — GLUCOSE, CAPILLARY
GLUCOSE-CAPILLARY: 104 mg/dL — AB (ref 70–99)
Glucose-Capillary: 63 mg/dL — ABNORMAL LOW (ref 70–99)
Glucose-Capillary: 138 mg/dL — ABNORMAL HIGH (ref 70–99)
Glucose-Capillary: 207 mg/dL — ABNORMAL HIGH (ref 70–99)
Glucose-Capillary: 260 mg/dL — ABNORMAL HIGH (ref 70–99)

## 2018-01-24 LAB — CULTURE, BLOOD (ROUTINE X 2)
Culture: NO GROWTH
Culture: NO GROWTH
Special Requests: ADEQUATE
Special Requests: ADEQUATE

## 2018-01-24 MED ORDER — ENOXAPARIN SODIUM 40 MG/0.4ML ~~LOC~~ SOLN: 40.0000 mg | SUBCUTANEOUS | Status: DC

## 2018-01-24 NOTE — Progress Notes (Signed)
PROGRESS NOTE    Anthony Dunlap  RFX:[id number] DOB: 02-12-1934 DOA: 01/17/2018 PCP: [name], MD  Brief Narrative:84 y.o.malewith medical history significant forhypothyroidism, insulin-dependent diabetes mellitus, hyperlipidemia, and history of prostate cancer with chronic Foley catheter, now presenting to the emergency department for evaluation of rigors. Patient reports that he was in his usual state of health until overnight when he began sweating and developed shaking chills. In the ED, pt was febrile with HR in 140s. He also had serum glucose of 440 with anion gap of 12. He was initially started on IVF and IV insulin. He denies any recent cough, shortness of breath, chest pain, vomiting, or diarrhea. Patient was admitted for sepsis due topresumptive UTI initially. Urine culture was not revealing. Blood cultures grew peptostreptococcus and Bacteroides thetaiodomicron. Antibiotics were adjusted  Assessment & Plan:   Principal Problem:   Sepsis secondary to UTI [facility]) Active Problems:   Hypothyroidism   DKA (diabetic ketoacidoses) ([name])   Normocytic anemia   Sepsis due to Bacteroides species ([name])   Sepsis without acute organ dysfunction ([name])   Psoas abscess ([name])    #1 sepsis secondary to bacteremia with Peptostreptococcus and bacteroids currently on osyn.  CT scan of the abdomen and pelvis showing multiple intramuscular abscesses.  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  Reviewed Ortho note.  Consult placed to IR for drainage of the abscess.  Echocardiogram 01/19/2018 no evidence of vegetation.  Infectious disease consulted.  Follow-up blood cultures negative.  #2 uncontrolled type 2 diabetes upon presentation patient was in DKA which was treated with protocol.  Hemoglobin A1c 9.3.  Continue Levemir 20 units and SSI.  #3 hypothyroidism continue Synthroid  #4 history of prostate cancer and chronic Foley catheter due to change it sometime in December 2019.  #5  hyperlipidemia continue statin.  #6 hyponatremia avoid hypotonic fluids continue normal saline check TSH serum and urine osmolality       Estimated body mass index is 24.44 kg/m as calculated from the following:   Height as of this encounter: 5\' 8"  (1.727 m).   Weight as of this encounter: 72.9 kg.  DVT prophylaxis:  Code Status: Full code Family Communication: No family available patient reported that he has a daughter who takes care of his wife who has dementia. Disposition Plan:  Pending clinical progress  Consultants: Infectious disease and Ortho and interventional radiology  Procedures: None Antimicrobials: Zosyn   Subjective: Patient resting in bed no specific complaints no chest pain shortness of breath cough nausea vomiting diarrhea fever or chills.  Objective: Vitals:   01/23/18 2136 01/23/18 2337 01/24/18 0444 01/24/18 1000  BP:  113/65 132/68 126/76  Pulse:  89 96 (!) 102  Resp:  16 16 16   Temp: 99.7 F (37.6 C) (!) 97.4 F (36.3 C) 98.8 F (37.1 C) 99.9 F (37.7 C)  TempSrc: Oral Oral Oral Oral  SpO2:  100% 94% 100%  Weight:  72.9 kg    Height:  5\' 8"  (1.727 m)      Intake/Output Summary (Last 24 hours) at 01/24/2018 1334 Last data filed at 01/24/2018 1035 Gross per 24 hour  Intake 557.1 ml  Output 1875 ml  Net -1317.9 ml   Filed Weights   01/17/18 0209 01/17/18 0600 01/23/18 2337  Weight: 70.3 kg 67.1 kg 72.9 kg    Examination:  General exam: Appears calm and comfortable  Respiratory system: Clear to auscultation. Respiratory effort normal. Cardiovascular system: S1 & S2 heard, RRR. No JVD, murmurs, rubs, gallops or  clicks. No pedal edema. Gastrointestinal system: Abdomen is nondistended, soft and nontender. No organomegaly or masses felt. Normal bowel sounds heard. Central nervous system: Alert and oriented. No focal neurological deficits. Extremities: Symmetric 5 x 5 power. Skin: No rashes, lesions or ulcers Psychiatry: Judgement and  insight appear normal. Mood & affect appropriate.     Data Reviewed: I have personally reviewed following labs and imaging studies  CBC: Recent Labs  Lab 01/18/18 0417 01/20/18 0547 01/22/18 0548 01/23/18 0520  WBC 12.6* 16.0* 16.8* 20.2*  HGB 8.7* 8.0* 8.5* 8.0*  HCT 29.1* 26.2* 28.0* 26.0*  MCV 83.4 81.4 80.0 78.8*  PLT 681* 549* 573* 026*   Basic Metabolic Panel: Recent Labs  Lab 01/18/18 0417 01/20/18 0547 01/22/18 0548 01/23/18 0520  NA 135 132* 133* 126*  K 3.7 3.5 3.4* 3.2*  CL 104 100 99 92*  CO2 22 25 26 24   GLUCOSE 104* 92 78 250*  BUN 13 13 15 14   CREATININE 0.76 0.77 0.77 0.70  CALCIUM 8.1* 7.8* 7.9* 7.2*   GFR: Estimated Creatinine Clearance: 68.9 mL/min (by C-G formula based on SCr of 0.7 mg/dL). Liver Function Tests: No results for input(s): AST, ALT, ALKPHOS, BILITOT, PROT, ALBUMIN in the last 168 hours. No results for input(s): LIPASE, AMYLASE in the last 168 hours. No results for input(s): AMMONIA in the last 168 hours. Coagulation Profile: No results for input(s): INR, PROTIME in the last 168 hours. Cardiac Enzymes: No results for input(s): CKTOTAL, CKMB, CKMBINDEX, TROPONINI in the last 168 hours. BNP (last 3 results) No results for input(s): PROBNP in the last 8760 hours. HbA1C: No results for input(s): HGBA1C in the last 72 hours. CBG: Recent Labs  Lab 01/23/18 2154 01/23/18 2347 01/24/18 0821 01/24/18 0955 01/24/18 1205  GLUCAP 153* 211* 63* 104* 138*   Lipid Profile: No results for input(s): CHOL, HDL, LDLCALC, TRIG, CHOLHDL, LDLDIRECT in the last 72 hours. Thyroid Function Tests: No results for input(s): TSH, T4TOTAL, FREET4, T3FREE, THYROIDAB in the last 72 hours. Anemia Panel: No results for input(s): VITAMINB12, FOLATE, FERRITIN, TIBC, IRON, RETICCTPCT in the last 72 hours. Sepsis Labs: No results for input(s): PROCALCITON, LATICACIDVEN in the last 168 hours.  Recent Results (from the past 240 hour(s))  Urine culture      Status: Abnormal   Collection Time: 01/17/18  2:23 AM  Result Value Ref Range Status   Specimen Description   Final    URINE, CATHETERIZED Performed at [facility], [address], [place], [place] [address]    Special Requests   Final    Normal Performed at [facility], [address], [place], [place] [address]    Culture MULTIPLE SPECIES PRESENT, SUGGEST RECOLLECTION (A)  Final   Report Status 01/18/2018 FINAL  Final  Blood Culture (routine x 2)     Status: Abnormal   Collection Time: 01/17/18  2:40 AM  Result Value Ref Range Status   Specimen Description   Final    BLOOD RIGHT FOREARM Performed at [facility], [address], [place], [place] [address]    Special Requests   Final    BOTTLES DRAWN AEROBIC AND ANAEROBIC Blood Culture results may not be optimal due to an excessive volume of blood received in culture bottles Performed at [facility], [address], [place],  [address]    Culture  Setup Time   Final    GRAM POSITIVE COCCI ANAEROBIC BOTTLE Gram Stain Report Called to,Read Back By and Verified With: HYATT,L@1344  BY MATTHEWS,B 11.30.19  [facility] Organism ID to follow CRITICAL RESULT CALLED TO, READ BACK BY AND VERIFIED WITH: Cletis Media RN [id number] BY GF    Culture (A)  Final    PEPTOSTREPTOCOCCUS ASACCHAROLYTICUS BACTEROIDES THETAIOTAOMICRON BETA LACTAMASE POSITIVE Performed at [facility], [place] [address] [place], [place] [address]    Report Status 01/22/2018 FINAL  Final  Blood Culture ID Panel (Reflexed)     Status: None   Collection Time: 01/17/18  2:40 AM  Result Value Ref Range Status   Enterococcus species NOT DETECTED NOT DETECTED Final   Listeria monocytogenes NOT DETECTED NOT DETECTED Final   Staphylococcus species NOT DETECTED NOT DETECTED Final   Staphylococcus aureus (BCID) NOT DETECTED NOT DETECTED Final   Streptococcus species NOT DETECTED NOT DETECTED Final   Streptococcus agalactiae NOT DETECTED NOT DETECTED Final    Streptococcus pneumoniae NOT DETECTED NOT DETECTED Final   Streptococcus pyogenes NOT DETECTED NOT DETECTED Final   Acinetobacter baumannii NOT DETECTED NOT DETECTED Final   Enterobacteriaceae species NOT DETECTED NOT DETECTED Final   Enterobacter cloacae complex NOT DETECTED NOT DETECTED Final   Escherichia coli NOT DETECTED NOT DETECTED Final   Klebsiella oxytoca NOT DETECTED NOT DETECTED Final   Klebsiella pneumoniae NOT DETECTED NOT DETECTED Final   Proteus species NOT DETECTED NOT DETECTED Final   Serratia marcescens NOT DETECTED NOT DETECTED Final   Haemophilus influenzae NOT DETECTED NOT DETECTED Final   Neisseria meningitidis NOT DETECTED NOT DETECTED Final   Pseudomonas aeruginosa NOT DETECTED NOT DETECTED Final   Candida albicans NOT DETECTED NOT DETECTED Final   Candida glabrata NOT DETECTED NOT DETECTED Final   Candida krusei NOT DETECTED NOT DETECTED Final   Candida parapsilosis NOT DETECTED NOT DETECTED Final   Candida tropicalis NOT DETECTED NOT DETECTED Final    Comment: Performed at [facility], [place] [address] [place], [place] [address]  Blood Culture (routine x 2)     Status: Abnormal   Collection Time: 01/17/18  2:59 AM  Result Value Ref Range Status   Specimen Description   Final    BLOOD LEFT ANTECUBITAL Performed at [facility], [address], [place], [place] [address]    Special Requests   Final    BOTTLES DRAWN AEROBIC AND ANAEROBIC Blood Culture adequate volume Performed at [facility], [address] [place], [place] [address]    Culture  Setup Time   Final    GRAM POSITIVE COCCI Gram Stain Report Called to,Read Back By and Verified With: MOTLEY @ [id number] ON [id number] BY HENDERSON L. Performed at [facility], [address], [place], [place] [address]    Culture (A)  Final    PEPTOSTREPTOCOCCUS ASACCHAROLYTICUS ANAEROBIC GRAM NEGATIVE ROD BETA LACTAMASE POSITIVE Performed at [facility], [place] [address] [place], [place] [address]     Report Status 01/22/2018 FINAL  Final  MRSA PCR Screening     Status: Abnormal   Collection Time: 01/17/18  5:56 AM  Result Value Ref Range Status   MRSA by PCR POSITIVE (A) NEGATIVE Final    Comment:        The GeneXpert MRSA Assay (FDA approved for NASAL specimens only), is one component of a comprehensive MRSA colonization surveillance program. It is not intended to diagnose MRSA infection nor to guide or monitor treatment for MRSA infections. RESULT CALLED TO, READ BACK BY AND VERIFIED WITH: EVANS,H @ 7619 ON 01/17/18 BY JUW Performed at [facility], [address], [place], [place]  [address]   Culture, Urine     Status: None   Collection Time: 01/19/18 11:04 AM  Result Value Ref Range Status   Specimen Description   Final    URINE, CLEAN CATCH Performed at [facility], [address] [place], [place] [address]    Special Requests   Final    NONE Performed at [facility], [address] [place], [place] [address]    Culture   Final    Multiple bacterial morphotypes present, none predominant. Suggest appropriate recollection if clinically indicated.   Report Status 01/20/2018 FINAL  Final  Culture, blood (Routine X 2) w Reflex to ID Panel     Status: None   Collection Time: 01/19/18 11:26 AM  Result Value Ref Range Status   Specimen Description RIGHT ANTECUBITAL  Final   Special Requests   Final    BOTTLES DRAWN AEROBIC AND ANAEROBIC Blood Culture adequate volume   Culture   Final    NO GROWTH 5 DAYS Performed at [facility], [address], [place], [place] [address]    Report Status 01/24/2018 FINAL  Final  Culture, blood (Routine X 2) w Reflex to ID Panel     Status: None   Collection Time: 01/19/18 11:37 AM  Result Value Ref Range Status   Specimen Description BLOOD LEFT HAND  Final   Special Requests   Final    BOTTLES DRAWN AEROBIC AND ANAEROBIC Blood Culture adequate volume   Culture   Final    NO GROWTH 5 DAYS Performed at [facility], [address], [place], [place] [address]    Report Status 01/24/2018 FINAL  Final         Radiology Studies: Ct Abdomen Pelvis W Contrast  Result Date: 01/22/2018 CLINICAL DATA:  84 year old male presents with fever and tachycardia with anaerobic bacteremia. Abdominal pain and fever with abscess suspected. EXAM: CT ABDOMEN AND PELVIS WITH CONTRAST TECHNIQUE: Multidetector CT imaging of the abdomen and pelvis was performed using the standard protocol following bolus administration of intravenous contrast. CONTRAST:  149mL ISOVUE-300 IOPAMIDOL (ISOVUE-300) INJECTION 61% COMPARISON:  None. FINDINGS: Lower chest: Heart size is top normal without pericardial effusion. Trace bilateral pleural effusions with adjacent atelectasis are identified at each lung base, left slightly greater than right. Hepatobiliary: 8 mm hypodensity in the subcapsular right hepatic lobe, too small to characterize but may reflect a small cyst or hemangioma. No enhancing mass or biliary dilatation. No abscess. The gallbladder is unremarkable and free of stones. Pancreas: Atrophic pancreas. No mass or ductal dilatation. Inflammation. Spleen: Normal size spleen without focal mass. Adrenals/Urinary Tract: Normal bilateral adrenal glands. Symmetric cortical enhancement both kidneys with bilateral renal cysts. No nephrolithiasis nor hydroureteronephrosis. The urinary bladder is decompressed by Foley catheter. Stomach/Bowel: Small hiatal hernia is noted. The stomach is nondistended. Small bowel rotation is within limits. Enteric contrast is seen within jejunal loops without mechanical bowel obstruction or inflammation. Moderate stool retention is seen within the cecum and ascending colon with moderate stool also noted in the descending colon. No mural thickening is identified. The rectosigmoid is decompressed in appearance and likely explains the slightly thickened appearance. The appendix is not well visualized. Vascular/Lymphatic: Mild scattered  aortoiliac atherosclerosis without aneurysm. Mild reactive adenopathy suspected of the window nodes without significant pathologic enlargement. The largest contains fat and measure up to 12 mm on right and 10 mm on the left. Reproductive: Brachy therapy seeds are imbedded within the prostate. Other: Soft tissue abscesses are identified about the pelvis  and left hip. Mottled gas like lucency with low attenuating fluid is noted along the course of the distal right rectus muscle measuring 7.4 x 2.5 x 3.2 cm, series 6/49 and series 2/76. Additional tracking low-density fluid with mottled gas like lucencies are noted along the course of the left iliopsoas muscle extending to the left hip. Intramuscular abscesses are noted of the pectineus and obturator externus muscles bilaterally, left more prominent than right as well as left quadratus femoris muscle. Soft tissue abscess contiguous in adjacent to pubic symphysis is identified, series 2/81 measuring 3.3 x 4.4 x 4.1 cm. Soft tissue anasarca is noted of the included abdomen pelvis. Small low-density subcutaneous fluid collections are noted along the lateral aspect of both hips without gas like lucencies, right measuring at least 3.7 x 3 cm and on the left 5.5 by 1.9 cm, series 2/86. Musculoskeletal: Subtle cortical bone loss and osteopenia noted along the inferior pubic bone bilaterally. Changes of osteomyelitis is of concern, series 2/87 on bone windows possibly from septic arthritis given presence of adjacent abscess. IMPRESSION: 1. The over arching finding is that of numerous intramuscular abscesses as stated above along the course of the distal right rectus, about the bilateral pectineus, obturator externus and left quadratus femoris muscles as well as left ileo psoas. 2. Small abscess arising off the pubic symphysis is also noted with subtle bone loss and cortical bone destruction raising concern for septic arthritis and osteomyelitis of the pubic bone. 3. Left  greater than right small pleural effusions with atelectasis. 4. Bilateral renal cysts. These results were called by telephone at the time of interpretation on 01/22/2018 at 11:04 pm to NP Chaney Malling, who verbally acknowledged these results. Electronically Signed   By: Ashley Royalty M.D.   On: 01/22/2018 23:04        Scheduled Meds: . aspirin EC  81 mg Oral Daily  . enoxaparin (LOVENOX) injection  40 mg Subcutaneous Q24H  . feeding supplement (GLUCERNA SHAKE)  237 mL Oral BID BM  . insulin aspart  0-9 Units Subcutaneous TID WC  . insulin detemir  17 Units Subcutaneous QHS  . levothyroxine  25 mcg Oral Q0600  . mupirocin ointment  1 application Nasal BID  . pravastatin  10 mg Oral q1800   Continuous Infusions: . 0.9 % NaCl with KCl 20 mEq / L 75 mL/hr (01/24/18 0001)  . piperacillin-tazobactam (ZOSYN)  IV 3.375 g (01/24/18 1021)     LOS: 7 days     Georgette Shell, MD [facility]  If 7PM-7AM, please contact night-coverage [email or web address] Password [email or web address] 01/24/2018, 1:34 PM

## 2018-01-24 NOTE — Consult Note (Signed)
[facility]    Date of Admission:  01/17/2018   Total days of antibiotics 7         Day 3 Piperacillin-tazobactam         Cefepime 11/28 - 12/01        Cefazolin 12/01 - 12/02        PCN 12/03 - 12/04        metronidazole x 1 dose        Reason for Consult: pelvic osteo and multiple abscesses of muscles   Referring Provider: Rodena Piety  Primary Care Provider: [name], MD   Assessment: 84 y.o. male with polymicrobial bacteremia (peptostreptococus, bacteroides species) in the setting of distal rectal sheath muscle abscess, left iliopsoas muscle with extension into hip, pectineus and obturator externus muscles bilaterally (L>R) left quadratus femoris muscle. 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 Ortho team has evaluated Mr. Pellicane and planning on IR to place percutaneous drain.   In addition to drainage of foci he will need PICC line and prolonged IV antibiotics with consideration of oral thereafter. Will stop vancomycin and continue piperacillin tazobactam alone for now. He will need serial imaging to ensure resolution.   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 It sounds like he has some children in the area that could be helpful to meet his home care needs.   Plan: 1. Place PICC line over weekend when afebrile x 24 hours 2. D/C vancomycin  3. Continue piperacillin-tazobactam  4. Case management consult for OPAT (ordered)  5. Check CRP/ESR in AM to follow for treatment  6. 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 aspirin EC  81 mg Oral Daily  . enoxaparin (LOVENOX) injection  40 mg Subcutaneous Q24H  . feeding supplement (GLUCERNA SHAKE)  237 mL Oral BID BM  . insulin aspart  0-9 Units Subcutaneous TID WC  . insulin detemir  17 Units Subcutaneous QHS  . levothyroxine  25 mcg Oral Q0600  . mupirocin ointment  1 application Nasal BID  . pravastatin  10 mg Oral q1800    HPI: Anthony Dunlap is a 84 y.o. male with pmhx significant for hypothyroidism, IDDM, hyperlipidemia, prostate cancer (s/p radiation), chronic foley catheter (reportedly for 1 year).   Presented to the hospital to evaluate rigors. He is somewhat of a poor/incomplete historian. He tells me that he was at home in usual state of health up until the night before admission where he was uncontrollably shaking. Also reports some left lower leg pain but does not report any altered/painful gait or trouble walking. He is the primary caretaker for his wife but reports 3 children in the area and help that comes to the house to help clean/cook and perform assistance with chores.  Of note he was admitted for four days 01/03/18 for sepsis related to UTI (urine cx with multiple species, blood with CoNS); treated with ceftriaxone and improved with discharge to home 11/19.  ED Course: Febrile to 103.9 F, tachycardic (140s), leukocytosis 20.8K. Blood and urine cultures were drawn and he was started on cefepime. He has continued to fever up to 103 F up until 12/03. Since that time he has had daily fevers up to 101 F. Blood cultures growing peptostreptococcus and bacteroides spcs (beta lactamase +). Urine again with multiple species. TTE was checked with ongoing fevers and unrevealing. During this time antibiotics were narrowed to cefazolin and later penicillin G then changed to flagyl IV.   CT of the abdomen was then obtained with suspicion of abdominal source - Soft tissue abscesses are identified about the pelvis and left  hip. Mottled gas like lucency with low attenuating fluid is noted along the course of the distal right rectus muscle measuring 7.4 x 2.5 x 3.2 cm. Additional tracking low-density fluid with mottled gas like lucencies are noted along the course of the left iliopsoas muscle extending to the left hip. Intramuscular abscesses are noted of the pectineus and obturator externus muscles bilaterally, left more prominent than right as well as left quadratus femoris muscle. Soft tissue abscess contiguous in adjacent to pubic symphysis is identified measuring 3.3 x 4.4 x 4.1 cm.  ABx broadned to vanco/pip-tazo with the above finding.   Review of Systems: Review of Systems  Constitutional: Positive for chills. Negative for fever and malaise/fatigue.  HENT: Negative for sore throat.   Eyes: Negative for blurred vision.  Respiratory: Negative for cough and shortness of breath.   Cardiovascular: Positive for leg swelling. Negative for chest pain. Claudication: left leg   Gastrointestinal: Negative for abdominal pain, diarrhea and vomiting.  Genitourinary: Negative for dysuria.       Chronic indwelling foley   Musculoskeletal: Negative for joint pain and myalgias.  Skin: Negative for rash.  Neurological: Negative for dizziness, tingling and tremors.    Past Medical History:  Diagnosis Date  . Diabetes ([place])   . Left eye pain   . Prostate cancer [facility])     Social History   Tobacco Use  . Smoking status: Never Smoker  . Smokeless tobacco: Never Used  Substance Use Topics  . Alcohol use: No  . Drug use: No    Family History  Problem Relation Age of Onset  . Pneumonia Father   . Cancer Sister    No Known Allergies  OBJECTIVE: Blood pressure 126/76, pulse (!) 102, temperature 99.9 F (37.7 C), temperature source Oral, resp. rate 16, height '5\' 8"'$  (1.727 m), weight 72.9 kg, SpO2 100 %.  Physical Exam  Constitutional: He is oriented to person, place, and time. He appears well-developed and  well-nourished.  Resting comfortably in bed.   HENT:  Mouth/Throat: Oropharynx is clear and moist.  Eyes: Pupils are equal, round, and reactive to light. No scleral icterus.  Cardiovascular: Normal rate and intact distal pulses.  No murmur heard. Tachycardic, mild  Pulmonary/Chest: Effort normal and breath sounds normal. No respiratory distress.  Abdominal: Soft. Bowel sounds are normal.  R LQ scar from previous appendectomy he had reportedly as an adult.   Genitourinary:  Genitourinary Comments: indwelling foley in place. No urethral drainage. Heavy sediment noted in urine   Musculoskeletal: Normal range of motion. He exhibits edema (2-3+ L>R).       Legs: Lymphadenopathy:    He has no cervical adenopathy.  Neurological: He is alert and oriented to person, place, and time.  Skin: Skin is warm and dry.  Vitals reviewed.   Lab Results Lab Results  Component Value  Date   WBC 20.2 (H) 01/23/2018   HGB 8.0 (L) 01/23/2018   HCT 26.0 (L) 01/23/2018   MCV 78.8 (L) 01/23/2018   PLT 613 (H) 01/23/2018    Lab Results  Component Value Date   CREATININE 0.70 01/23/2018   BUN 14 01/23/2018   NA 126 (L) 01/23/2018   K 3.2 (L) 01/23/2018   CL 92 (L) 01/23/2018   CO2 24 01/23/2018    Lab Results  Component Value Date   ALT 15 01/17/2018   AST 19 01/17/2018   ALKPHOS 152 (H) 01/17/2018   BILITOT 0.7 01/17/2018     Microbiology: Recent Results (from the past 240 hour(s))  Urine culture     Status: Abnormal   Collection Time: 01/17/18  2:23 AM  Result Value Ref Range Status   Specimen Description   Final    URINE, CATHETERIZED Performed at [facility], [address], [place], [place] [address]    Special Requests   Final    Normal Performed at [facility], [address] [place], [place] [address]    Culture MULTIPLE SPECIES PRESENT, SUGGEST RECOLLECTION (A)  Final   Report Status 01/18/2018 FINAL  Final  Blood Culture (routine x 2)     Status: Abnormal   Collection  Time: 01/17/18  2:40 AM  Result Value Ref Range Status   Specimen Description   Final    BLOOD RIGHT FOREARM Performed at [facility], [address], [place], [place] [address]    Special Requests   Final    BOTTLES DRAWN AEROBIC AND ANAEROBIC Blood Culture results may not be optimal due to an excessive volume of blood received in culture bottles Performed at [place] [place], [place] [address]    Culture  Setup Time   Final    GRAM POSITIVE COCCI ANAEROBIC BOTTLE Gram Stain Report Called to,Read Back By and Verified With: HYATT,L'@1344'$  BY MATTHEWS,B 11.30.19 [facility] Organism ID to follow CRITICAL RESULT CALLED TO, READ BACK BY AND VERIFIED WITH: Y [place] [id number] BY GF    Culture (A)  Final    PEPTOSTREPTOCOCCUS ASACCHAROLYTICUS BACTEROIDES THETAIOTAOMICRON BETA LACTAMASE POSITIVE Performed at [facility], [place] [address] [place], [place] [address]    Report Status 01/22/2018 FINAL  Final  Blood Culture ID Panel (Reflexed)     Status: None   Collection Time: 01/17/18  2:40 AM  Result Value Ref Range Status   Enterococcus species NOT DETECTED NOT DETECTED Final   Listeria monocytogenes NOT DETECTED NOT DETECTED Final   Staphylococcus species NOT DETECTED NOT DETECTED Final   Staphylococcus aureus (BCID) NOT DETECTED NOT DETECTED Final   Streptococcus species NOT DETECTED NOT DETECTED Final   Streptococcus agalactiae NOT DETECTED NOT DETECTED Final   Streptococcus pneumoniae NOT DETECTED NOT DETECTED Final   Streptococcus pyogenes NOT DETECTED NOT DETECTED Final   Acinetobacter baumannii NOT DETECTED NOT DETECTED Final   Enterobacteriaceae species NOT DETECTED NOT DETECTED Final   Enterobacter cloacae complex NOT DETECTED NOT DETECTED Final   Escherichia coli NOT DETECTED NOT DETECTED Final   Klebsiella oxytoca NOT DETECTED NOT DETECTED Final   Klebsiella pneumoniae NOT DETECTED NOT DETECTED Final   Proteus species NOT DETECTED NOT  DETECTED Final   Serratia marcescens NOT DETECTED NOT DETECTED Final   Haemophilus influenzae NOT DETECTED NOT DETECTED Final   Neisseria meningitidis NOT DETECTED NOT DETECTED Final   Pseudomonas aeruginosa NOT DETECTED NOT DETECTED Final   Candida albicans NOT  DETECTED NOT DETECTED Final   Candida glabrata NOT DETECTED NOT DETECTED Final   Candida krusei NOT DETECTED NOT DETECTED Final   Candida parapsilosis NOT DETECTED NOT DETECTED Final   Candida tropicalis NOT DETECTED NOT DETECTED Final    Comment: Performed at [facility], [place] [address] [place], [place] [address]  Blood Culture (routine x 2)     Status: Abnormal   Collection Time: 01/17/18  2:59 AM  Result Value Ref Range Status   Specimen Description   Final    BLOOD LEFT ANTECUBITAL Performed at [facility], [address], [place], [place] [address]    Special Requests   Final    BOTTLES DRAWN AEROBIC AND ANAEROBIC Blood Culture adequate volume Performed at [facility], [address], [place], [place] [address]    Culture  Setup Time   Final    GRAM POSITIVE COCCI Gram Stain Report Called to,Read Back By and Verified With: MOTLEY @ 8938 ON [id number] BY HENDERSON L. Performed at [facility], [address] [place], [place] [address]    Culture (A)  Final    PEPTOSTREPTOCOCCUS ASACCHAROLYTICUS ANAEROBIC GRAM NEGATIVE ROD BETA LACTAMASE POSITIVE Performed at [facility], [place] [address], [place], [place] [address]    Report Status 01/22/2018 FINAL  Final  MRSA PCR Screening     Status: Abnormal   Collection Time: 01/17/18  5:56 AM  Result Value Ref Range Status   MRSA by PCR POSITIVE (A) NEGATIVE Final    Comment:        The GeneXpert MRSA Assay (FDA approved for NASAL specimens only), is one component of a comprehensive MRSA colonization surveillance program. It is not intended to diagnose MRSA infection nor to guide or monitor treatment for MRSA infections. RESULT CALLED TO, READ BACK BY AND  VERIFIED WITH: EVANS,H @ 3536 ON 01/17/18 BY JUW Performed at [facility], [address], [place], [place] [address]   Culture, Urine     Status: None   Collection Time: 01/19/18 11:04 AM  Result Value Ref Range Status   Specimen Description   Final    URINE, CLEAN CATCH Performed at [facility], [address], [place], [place] [address]    Special Requests   Final    NONE Performed at [facility], [address] [place], [place] [address]    Culture   Final    Multiple bacterial morphotypes present, none predominant. Suggest appropriate recollection if clinically indicated.   Report Status 01/20/2018 FINAL  Final  Culture, blood (Routine X 2) w Reflex to ID Panel     Status: None   Collection Time: 01/19/18 11:26 AM  Result Value Ref Range Status   Specimen Description RIGHT ANTECUBITAL  Final   Special Requests   Final    BOTTLES DRAWN AEROBIC AND ANAEROBIC Blood Culture adequate volume   Culture   Final    NO GROWTH 5 DAYS Performed at [facility], [address], [place], [place] [address]    Report Status 01/24/2018 FINAL  Final  Culture, blood (Routine X 2) w Reflex to ID Panel     Status: None   Collection Time: 01/19/18 11:37 AM  Result Value Ref Range Status   Specimen Description BLOOD LEFT HAND  Final   Special Requests   Final    BOTTLES DRAWN AEROBIC AND ANAEROBIC Blood Culture adequate volume   Culture   Final    NO GROWTH 5 DAYS Performed at [facility], [address], [place], [place] [address]    Report Status 01/24/2018 FINAL  Final    Janene Madeira, MSN, NP-C [place] [facility] Cell: [phone] Pager: [phone]  01/24/2018 1:57 PM

## 2018-01-24 NOTE — Consult Note (Addendum)
Chief Complaint: Patient was seen in consultation today for image guided aspiration/possible drainage of right rectus sheath abscess Chief Complaint  Patient presents with  . Hyperglycemia    Referring Physician(s): Matthews,E  Supervising Physician: Aletta Edouard  Patient Status: [facility] - In-pt  History of Present Illness: Anthony Dunlap is an 84 y.o. male with hx of DM, HLD, hypothyroidism, prostate ca with prior radiation/seed implants and chronic Foley cath who recently presented to [facility] with fevers, chills, elevated HR, hyperglycemia and presumed urosepsis. Urine cx was unremarkable. He was later transferred to [facility].  Patient blood cultures have grown staph, Peptostreptococcus and Bacteroides. Underwent right hip injection on 10/15/2017.  He continues to have some abdominal discomfort and subsequent CT scan on 12/4 revealed numerous intramuscular abscesses along the course of the distal right rectus, about the bilateral pectineus, obturator externus and left quadratus femoris muscles well as left iliopsoas region.  There was also a small abscess arising off the pubic symphysis with subtle bone loss and cortical bone destruction raising concern for septic arthritis and osteomyelitis of the pubic bone.  Also noted were left greater than right small pleural effusions.  Labs revealed WBC 20.2, hemoglobin 8.0, potassium 3.2, creatinine 0.7.  Request now received from primary care team for image guided aspiration and possible drainage of the right rectus abscess.  Past Medical History:  Diagnosis Date  . Diabetes ([place])   . Left eye pain   . Prostate cancer [facility])     Past Surgical History:  Procedure Laterality Date  . HERNIA REPAIR    . PROSTATE SURGERY      Allergies: Patient has no known allergies.  Medications: Prior to Admission medications   Medication Sig Start Date End Date Taking? Authorizing Provider  aspirin EC 81 MG tablet Take 81 mg by mouth daily.   Yes [provider]  LEVEMIR FLEXTOUCH 100 UNIT/ML Pen Inject 30 Units into the skin daily. 01/07/18  Yes Kathie Dike, MD  levothyroxine (SYNTHROID, LEVOTHROID) 25 MCG tablet Take 25 mcg by mouth daily before breakfast.   Yes [provider]  pravastatin (PRAVACHOL) 10 MG tablet Take 10 mg by mouth daily.   Yes [provider]     Family History  Problem Relation Age of Onset  . Pneumonia Father   . Cancer Sister     Social History   Socioeconomic History  . Marital status: Married    Spouse name: [name]  . Number of children: 3  . Years of education: 9th  . Highest education level: Not on file  Occupational History    Employer: RETIRED    Comment: Retired  Scientific laboratory technician  . Financial resource strain: Not on file  . Food insecurity:    Worry: Not on file    Inability: Not on file  . Transportation needs:    Medical: Not on file    Non-medical: Not on file  Tobacco Use  . Smoking status: Never Smoker  . Smokeless tobacco: Never Used  Substance and Sexual Activity  . Alcohol use: No  . Drug use: No  . Sexual activity: Not on file  Lifestyle  . Physical activity:    Days per week: Not on file    Minutes per session: Not on file  . Stress: Not on file  Relationships  . Social connections:    Talks on phone: Not on file    Gets together: Not on file    Attends religious service: Not on file  Active member of club or organization: Not on file    Attends meetings of clubs or organizations: Not on file    Relationship status: Not on file  Other Topics Concern  . Not on file  Social History Narrative   Patient lives at home with his wife. [name]) . Patient is retired.   Education 9th grade.   Right handed.   Caffeine None      Review of Systems currently denies fever, headache, worsening chest pain, dyspnea, cough, back pain, nausea, vomiting or bleeding.  He does continue to have some abdominal discomfort ,primarily right of  umbilicus  Vital Signs: BP 137/73 (BP Location: Left Arm)   Pulse (!) 103   Temp 99.2 F (37.3 C) (Oral)   Resp 16   Ht 5\' 8"  (1.727 m)   Wt 160 lb 11.5 oz (72.9 kg)   SpO2 98%   BMI 24.44 kg/m   Physical Exam awake, alert.  Chest with slight diminished breath sounds bases.  Heart with tachycardia but regular rhythm.  Abdomen soft, positive bowel sounds, some tenderness noted to the right of umbilicus.  Left greater than right lower extremity edema noted.  Imaging: Dg Chest 2 View  Result Date: 01/03/2018 CLINICAL DATA:  Sepsis, fever, no pain EXAM: CHEST - 2 VIEW COMPARISON:  09/26/2007 FINDINGS: There is no focal parenchymal opacity. There is no pleural effusion or pneumothorax. The heart and mediastinal contours are unremarkable. There is mild osteoarthritis of bilateral glenohumeral joints, left greater than right. IMPRESSION: No active cardiopulmonary disease. Electronically Signed   By: Kathreen Devoid   On: 01/03/2018 20:23   Ct Abdomen Pelvis W Contrast  Result Date: 01/22/2018 CLINICAL DATA:  84 year old male presents with fever and tachycardia with anaerobic bacteremia. Abdominal pain and fever with abscess suspected. EXAM: CT ABDOMEN AND PELVIS WITH CONTRAST TECHNIQUE: Multidetector CT imaging of the abdomen and pelvis was performed using the standard protocol following bolus administration of intravenous contrast. CONTRAST:  122mL ISOVUE-300 IOPAMIDOL (ISOVUE-300) INJECTION 61% COMPARISON:  None. FINDINGS: Lower chest: Heart size is top normal without pericardial effusion. Trace bilateral pleural effusions with adjacent atelectasis are identified at each lung base, left slightly greater than right. Hepatobiliary: 8 mm hypodensity in the subcapsular right hepatic lobe, too small to characterize but may reflect a small cyst or hemangioma. No enhancing mass or biliary dilatation. No abscess. The gallbladder is unremarkable and free of stones. Pancreas: Atrophic pancreas. No mass or  ductal dilatation. Inflammation. Spleen: Normal size spleen without focal mass. Adrenals/Urinary Tract: Normal bilateral adrenal glands. Symmetric cortical enhancement both kidneys with bilateral renal cysts. No nephrolithiasis nor hydroureteronephrosis. The urinary bladder is decompressed by Foley catheter. Stomach/Bowel: Small hiatal hernia is noted. The stomach is nondistended. Small bowel rotation is within limits. Enteric contrast is seen within jejunal loops without mechanical bowel obstruction or inflammation. Moderate stool retention is seen within the cecum and ascending colon with moderate stool also noted in the descending colon. No mural thickening is identified. The rectosigmoid is decompressed in appearance and likely explains the slightly thickened appearance. The appendix is not well visualized. Vascular/Lymphatic: Mild scattered aortoiliac atherosclerosis without aneurysm. Mild reactive adenopathy suspected of the window nodes without significant pathologic enlargement. The largest contains fat and measure up to 12 mm on right and 10 mm on the left. Reproductive: Brachy therapy seeds are imbedded within the prostate. Other: Soft tissue abscesses are identified about the pelvis and left hip. Mottled gas like lucency with low attenuating fluid is noted  along the course of the distal right rectus muscle measuring 7.4 x 2.5 x 3.2 cm, series 6/49 and series 2/76. Additional tracking low-density fluid with mottled gas like lucencies are noted along the course of the left iliopsoas muscle extending to the left hip. Intramuscular abscesses are noted of the pectineus and obturator externus muscles bilaterally, left more prominent than right as well as left quadratus femoris muscle. Soft tissue abscess contiguous in adjacent to pubic symphysis is identified, series 2/81 measuring 3.3 x 4.4 x 4.1 cm. Soft tissue anasarca is noted of the included abdomen pelvis. Small low-density subcutaneous fluid collections  are noted along the lateral aspect of both hips without gas like lucencies, right measuring at least 3.7 x 3 cm and on the left 5.5 by 1.9 cm, series 2/86. Musculoskeletal: Subtle cortical bone loss and osteopenia noted along the inferior pubic bone bilaterally. Changes of osteomyelitis is of concern, series 2/87 on bone windows possibly from septic arthritis given presence of adjacent abscess. IMPRESSION: 1. The over arching finding is that of numerous intramuscular abscesses as stated above along the course of the distal right rectus, about the bilateral pectineus, obturator externus and left quadratus femoris muscles as well as left ileo psoas. 2. Small abscess arising off the pubic symphysis is also noted with subtle bone loss and cortical bone destruction raising concern for septic arthritis and osteomyelitis of the pubic bone. 3. Left greater than right small pleural effusions with atelectasis. 4. Bilateral renal cysts. These results were called by telephone at the time of interpretation on 01/22/2018 at 11:04 pm to NP Chaney Malling, who verbally acknowledged these results. Electronically Signed   By: Ashley Royalty M.D.   On: 01/22/2018 23:04    Labs:  CBC: Recent Labs    01/18/18 0417 01/20/18 0547 01/22/18 0548 01/23/18 0520  WBC 12.6* 16.0* 16.8* 20.2*  HGB 8.7* 8.0* 8.5* 8.0*  HCT 29.1* 26.2* 28.0* 26.0*  PLT 681* 549* 573* 613*    COAGS: Recent Labs    01/03/18 1929 01/17/18 0443  INR 1.20 1.31  APTT  --  37*    BMP: Recent Labs    01/18/18 0417 01/20/18 0547 01/22/18 0548 01/23/18 0520  NA 135 132* 133* 126*  K 3.7 3.5 3.4* 3.2*  CL 104 100 99 92*  CO2 22 25 26 24   GLUCOSE 104* 92 78 250*  BUN 13 13 15 14   CALCIUM 8.1* 7.8* 7.9* 7.2*  CREATININE 0.76 0.77 0.77 0.70  GFRNONAA >60 >60 >60 >60  GFRAA >60 >60 >60 >60    LIVER FUNCTION TESTS: Recent Labs    01/03/18 1929 01/05/18 0551 01/17/18 0237  BILITOT 1.0 0.3 0.7  AST 26 21 19   ALT 19 19 15   ALKPHOS  132* 141* 152*  PROT 7.8 7.1 7.2  ALBUMIN 3.1* 2.6* 2.5*    TUMOR MARKERS: No results for input(s): AFPTM, CEA, CA199, CHROMGRNA in the last 8760 hours.  Assessment and Plan: 84 y.o. male with hx of DM, HLD, hypothyroidism, prostate ca with prior radiation/seed implants and chronic Foley cath who recently presented to [facility] with fevers, chills, elevated HR, hyperglycemia and presumed urosepsis. Urine cx was unremarkable. He was later transferred to [facility].  Patient blood cultures have grown staph, Peptostreptococcus and Bacteroides. Underwent right hip injection on 10/15/2017.  He continues to have some abdominal discomfort and subsequent CT scan on 12/4 revealed numerous intramuscular abscesses along the course of the distal right rectus, about the bilateral pectineus, obturator externus and left quadratus  femoris muscles well as left iliopsoas region.  There was also a small abscess arising off the pubic symphysis with subtle bone loss and cortical bone destruction raising concern for septic arthritis and osteomyelitis of the pubic bone.  Also noted were left greater than right small pleural effusions.  Labs revealed WBC 20.2, hemoglobin 8.0, potassium 3.2, creatinine 0.7.  Request now received from primary care team for image guided aspiration and possible drainage of the right rectus abscess.  Imaging studies have been reviewed by Dr. Kathlene Cote.Risks and benefits discussed with the patient/family including bleeding, infection, damage to adjacent structures, and sepsis. ?consider LE venous doppler due to L>R edema  All of the patient's questions were answered, patient is agreeable to proceed. Consent signed and in chart.  Needs ID consult  Procedure scheduled for 12/7; need to hold lovenox until after above procedure    Thank you for this interesting consult.  I greatly enjoyed meeting Anthony Dunlap and look forward to participating in their care.  A copy of this report was sent to the requesting  provider on this date.  Electronically Signed: D. Rowe Robert, PA-C 01/24/2018, 2:46 PM   I spent a total of 30 minutes    in face to face in clinical consultation, greater than 50% of which was counseling/coordinating care for image guided aspiration and possible drainage of right rectus sheath abscess

## 2018-01-24 NOTE — Progress Notes (Signed)
Patient arrived to unit 6 N bed 27 via carelink. Assisted patient to bed by nursing staff.Patient denies pain or discomfort at present time.Oriented patient to nursing call bell and unit.Educated patient not to get out of bed without assistance from nursing staff.Patient verbalized understanding.Willl continue to monitor and notify accepting MD of patient arrival.

## 2018-01-24 NOTE — Consult Note (Signed)
ORTHOPAEDIC CONSULTATION  REQUESTING PHYSICIAN: Georgette Shell, MD  Chief Complaint: Multiple intramuscular abscesses  HPI: Anthony Dunlap is a 84 y.o. male with DM, chronic indwelling foley, HLD who presented to [facility] ED with rigors.  He states that he developed these symptoms 2 nights ago.  His glucose was 440.  He denies any CP, nausea, vomiting.  He was admitted for urosepsis.  Ortho consulted for multiple intramuscular abscesses found on CT scan.  Past Medical History:  Diagnosis Date  . Diabetes ([place])   . Left eye pain   . Prostate cancer [facility])    Past Surgical History:  Procedure Laterality Date  . HERNIA REPAIR    . PROSTATE SURGERY     Social History   Socioeconomic History  . Marital status: Married    Spouse name: [name]  . Number of children: 3  . Years of education: 9th  . Highest education level: Not on file  Occupational History    Employer: RETIRED    Comment: Retired  Scientific laboratory technician  . Financial resource strain: Not on file  . Food insecurity:    Worry: Not on file    Inability: Not on file  . Transportation needs:    Medical: Not on file    Non-medical: Not on file  Tobacco Use  . Smoking status: Never Smoker  . Smokeless tobacco: Never Used  Substance and Sexual Activity  . Alcohol use: No  . Drug use: No  . Sexual activity: Not on file  Lifestyle  . Physical activity:    Days per week: Not on file    Minutes per session: Not on file  . Stress: Not on file  Relationships  . Social connections:    Talks on phone: Not on file    Gets together: Not on file    Attends religious service: Not on file    Active member of club or organization: Not on file    Attends meetings of clubs or organizations: Not on file    Relationship status: Not on file  Other Topics Concern  . Not on file  Social History Narrative   Patient lives at home with his wife. [name]) . Patient is retired.   Education 9th grade.   Right handed.   Caffeine None   Family History  Problem Relation Age of Onset  . Pneumonia Father   . Cancer Sister    - negative except otherwise stated in the family history section No Known Allergies Prior to Admission medications   Medication Sig Start Date End Date Taking? Authorizing Provider  aspirin EC 81 MG tablet Take 81 mg by mouth daily.   Yes [provider]  LEVEMIR FLEXTOUCH 100 UNIT/ML Pen Inject 30 Units into the skin daily. 01/07/18  Yes Kathie Dike, MD  levothyroxine (SYNTHROID, LEVOTHROID) 25 MCG tablet Take 25 mcg by mouth daily before breakfast.   Yes [provider]  pravastatin (PRAVACHOL) 10 MG tablet Take 10 mg by mouth daily.   Yes [provider]   Ct Abdomen Pelvis W Contrast  Result Date: 01/22/2018 CLINICAL DATA:  84 year old male presents with fever and tachycardia with anaerobic bacteremia. Abdominal pain and fever with abscess suspected. EXAM: CT ABDOMEN AND PELVIS WITH CONTRAST TECHNIQUE: Multidetector CT imaging of the abdomen and pelvis was performed using the standard protocol following bolus administration of intravenous contrast. CONTRAST:  181mL ISOVUE-300 IOPAMIDOL (ISOVUE-300) INJECTION 61% COMPARISON:  None. FINDINGS: Lower chest: Heart size is top normal  without pericardial effusion. Trace bilateral pleural effusions with adjacent atelectasis are identified at each lung base, left slightly greater than right. Hepatobiliary: 8 mm hypodensity in the subcapsular right hepatic lobe, too small to characterize but may reflect a small cyst or hemangioma. No enhancing mass or biliary dilatation. No abscess. The gallbladder is unremarkable and free of stones. Pancreas: Atrophic pancreas. No mass or ductal dilatation. Inflammation. Spleen: Normal size spleen without focal mass. Adrenals/Urinary Tract: Normal bilateral adrenal glands. Symmetric cortical enhancement both kidneys with bilateral renal cysts. No nephrolithiasis nor hydroureteronephrosis.  The urinary bladder is decompressed by Foley catheter. Stomach/Bowel: Small hiatal hernia is noted. The stomach is nondistended. Small bowel rotation is within limits. Enteric contrast is seen within jejunal loops without mechanical bowel obstruction or inflammation. Moderate stool retention is seen within the cecum and ascending colon with moderate stool also noted in the descending colon. No mural thickening is identified. The rectosigmoid is decompressed in appearance and likely explains the slightly thickened appearance. The appendix is not well visualized. Vascular/Lymphatic: Mild scattered aortoiliac atherosclerosis without aneurysm. Mild reactive adenopathy suspected of the window nodes without significant pathologic enlargement. The largest contains fat and measure up to 12 mm on right and 10 mm on the left. Reproductive: Brachy therapy seeds are imbedded within the prostate. Other: Soft tissue abscesses are identified about the pelvis and left hip. Mottled gas like lucency with low attenuating fluid is noted along the course of the distal right rectus muscle measuring 7.4 x 2.5 x 3.2 cm, series 6/49 and series 2/76. Additional tracking low-density fluid with mottled gas like lucencies are noted along the course of the left iliopsoas muscle extending to the left hip. Intramuscular abscesses are noted of the pectineus and obturator externus muscles bilaterally, left more prominent than right as well as left quadratus femoris muscle. Soft tissue abscess contiguous in adjacent to pubic symphysis is identified, series 2/81 measuring 3.3 x 4.4 x 4.1 cm. Soft tissue anasarca is noted of the included abdomen pelvis. Small low-density subcutaneous fluid collections are noted along the lateral aspect of both hips without gas like lucencies, right measuring at least 3.7 x 3 cm and on the left 5.5 by 1.9 cm, series 2/86. Musculoskeletal: Subtle cortical bone loss and osteopenia noted along the inferior pubic bone  bilaterally. Changes of osteomyelitis is of concern, series 2/87 on bone windows possibly from septic arthritis given presence of adjacent abscess. IMPRESSION: 1. The over arching finding is that of numerous intramuscular abscesses as stated above along the course of the distal right rectus, about the bilateral pectineus, obturator externus and left quadratus femoris muscles as well as left ileo psoas. 2. Small abscess arising off the pubic symphysis is also noted with subtle bone loss and cortical bone destruction raising concern for septic arthritis and osteomyelitis of the pubic bone. 3. Left greater than right small pleural effusions with atelectasis. 4. Bilateral renal cysts. These results were called by telephone at the time of interpretation on 01/22/2018 at 11:04 pm to NP Chaney Malling, who verbally acknowledged these results. Electronically Signed   By: Ashley Royalty M.D.   On: 01/22/2018 23:04   - pertinent xrays, CT, MRI studies were reviewed and independently interpreted  Positive ROS: All other systems have been reviewed and were otherwise negative with the exception of those mentioned in the HPI and as above.  Physical Exam: General: Alert, no acute distress Cardiovascular: No pedal edema Respiratory: No cyanosis, no use of accessory musculature GI: No organomegaly, abdomen is  soft and non-tender Skin: No lesions in the area of chief complaint Neurologic: Sensation intact distally Psychiatric: Patient is competent for consent with normal mood and affect Lymphatic: No axillary or cervical lymphadenopathy  MUSCULOSKELETAL:  - left inner thigh moderately edematous with redness and warmth, compartments soft - pubic symphysis is mildly tender to palpation - moderate discomfort with movement of LLE and hip  Assessment: Small abscess from pubic symphysis Chronic pubic symphysis osteomyelitis Multiple intramuscular abscesses of left QF, iliopsoas, pectineus, obturator  externus  Plan: I have reviewed the CT images and the abscesses should be amenable to IR drainage.  I suspect that this may be a subacute or even chronic process.  Recommend continued culture directed abx by primary vs ID team.  Call with questions.    Thank you for the consult and the opportunity to see Mr. Anthony Dunlap. Eduard Roux, MD [place] 7:26 AM

## 2018-01-24 NOTE — Progress Notes (Signed)
Spoke with Pleas Koch from [facility] admissions concerning patient arrival. Informed to call floor coverage with any problems overnight and that MD will see patient on morning rounds.

## 2018-01-25 ENCOUNTER — Inpatient Hospital Stay (HOSPITAL_COMMUNITY): Payer: Medicare Other

## 2018-01-25 DIAGNOSIS — B954 Other streptococcus as the cause of diseases classified elsewhere: Secondary | ICD-10-CM

## 2018-01-25 DIAGNOSIS — M869 Osteomyelitis, unspecified: Secondary | ICD-10-CM

## 2018-01-25 DIAGNOSIS — B9689 Other specified bacterial agents as the cause of diseases classified elsewhere: Secondary | ICD-10-CM

## 2018-01-25 DIAGNOSIS — K651 Peritoneal abscess: Secondary | ICD-10-CM

## 2018-01-25 LAB — BASIC METABOLIC PANEL
Anion gap: 12 (ref 5–15)
BUN: 12 mg/dL (ref 8–23)
CO2: 24 mmol/L (ref 22–32)
CREATININE: 0.96 mg/dL (ref 0.61–1.24)
Calcium: 7.4 mg/dL — ABNORMAL LOW (ref 8.9–10.3)
Chloride: 94 mmol/L — ABNORMAL LOW (ref 98–111)
GFR calc Af Amer: >60 mL/min (ref >60)
GFR calc non Af Amer: >60 mL/min (ref >60)
Glucose, Bld: 186 mg/dL — ABNORMAL HIGH (ref 70–99)
Potassium: 2.7 mmol/L — CL (ref 3.5–5.1)
Sodium: 130 mmol/L — ABNORMAL LOW (ref 135–145)

## 2018-01-25 LAB — CBC WITH DIFFERENTIAL/PLATELET
Abs Immature Granulocytes: 0.75 10*3/uL — ABNORMAL HIGH (ref 0.00–0.07)
Basophils Absolute: 0 10*3/uL (ref 0.0–0.1)
Basophils Relative: 0 %
Eosinophils Absolute: 0.1 10*3/uL (ref 0.0–0.5)
Eosinophils Relative: 1 %
HCT: 25.7 % — ABNORMAL LOW (ref 39.0–52.0)
HEMOGLOBIN: 7.9 g/dL — AB (ref 13.0–17.0)
Immature Granulocytes: 3 %
LYMPHS ABS: 1.7 10*3/uL (ref 0.7–4.0)
LYMPHS PCT: 7 %
MCH: 23.9 pg — ABNORMAL LOW (ref 26.0–34.0)
MCHC: 30.7 g/dL (ref 30.0–36.0)
MCV: 77.6 fL — ABNORMAL LOW (ref 80.0–100.0)
Monocytes Absolute: 1 10*3/uL (ref 0.1–1.0)
Monocytes Relative: 4 %
NEUTROS ABS: 19.6 10*3/uL — AB (ref 1.7–7.7)
Neutrophils Relative %: 85 %
Platelets: 678 10*3/uL — ABNORMAL HIGH (ref 150–400)
RBC: 3.31 MIL/uL — ABNORMAL LOW (ref 4.22–5.81)
RDW: 14.3 % (ref 11.5–15.5)
WBC: 23.2 10*3/uL — ABNORMAL HIGH (ref 4.0–10.5)
nRBC: 0 % (ref 0.0–0.2)

## 2018-01-25 LAB — GLUCOSE, CAPILLARY
Glucose-Capillary: 95 mg/dL (ref 70–99)
Glucose-Capillary: 69 mg/dL — ABNORMAL LOW (ref 70–99)
Glucose-Capillary: 195 mg/dL — ABNORMAL HIGH (ref 70–99)
Glucose-Capillary: 211 mg/dL — ABNORMAL HIGH (ref 70–99)

## 2018-01-25 LAB — SEDIMENTATION RATE: Sed Rate: 94 mm/hr — ABNORMAL HIGH (ref 0–16)

## 2018-01-25 LAB — C-REACTIVE PROTEIN: CRP: 23.1 mg/dL — AB (ref <1.0)

## 2018-01-25 LAB — PROTIME-INR
INR: 1.15
Prothrombin Time: 14.6 seconds (ref 11.4–15.2)

## 2018-01-25 LAB — TSH: TSH: 3.912 u[IU]/mL (ref 0.350–4.500)

## 2018-01-25 LAB — OSMOLALITY: Osmolality: 275 mOsm/kg (ref 275–295)

## 2018-01-25 LAB — OSMOLALITY, URINE: Osmolality, Ur: 416 mOsm/kg (ref 300–900)

## 2018-01-25 MED ORDER — MIDAZOLAM HCL 2 MG/2ML IJ SOLN
INTRAMUSCULAR | Status: AC | PRN
  Administered 2018-01-25 (×2): 1 mg via INTRAVENOUS

## 2018-01-25 MED ORDER — SODIUM CHLORIDE 0.9% FLUSH
5.0000 mL | Freq: Three times a day (TID) | INTRAVENOUS | Status: DC
  Administered 2018-01-25 – 2018-02-17 (×55): 5 mL

## 2018-01-25 MED ORDER — FENTANYL CITRATE (PF) 100 MCG/2ML IJ SOLN
INTRAMUSCULAR | Status: AC
  Filled 2018-01-25: qty 2

## 2018-01-25 MED ORDER — POTASSIUM CHLORIDE CRYS ER 20 MEQ PO TBCR
40.0000 meq | EXTENDED_RELEASE_TABLET | ORAL | Status: AC
  Administered 2018-01-25 (×3): 40 meq via ORAL
  Filled 2018-01-25 (×3): qty 2

## 2018-01-25 MED ORDER — SODIUM CHLORIDE 0.9 % IV SOLN
3.0000 g | Freq: Four times a day (QID) | INTRAVENOUS | Status: DC
  Administered 2018-01-25 – 2018-02-18 (×96): 3 g via INTRAVENOUS
  Filled 2018-01-25 (×100): qty 3

## 2018-01-25 MED ORDER — SODIUM CHLORIDE 0.9 % IV SOLN
INTRAVENOUS | Status: DC | PRN
  Administered 2018-01-25 – 2018-02-08 (×2): 250 mL via INTRAVENOUS

## 2018-01-25 MED ORDER — FENTANYL CITRATE (PF) 100 MCG/2ML IJ SOLN
INTRAMUSCULAR | Status: AC | PRN
  Administered 2018-01-25 (×2): 50 ug via INTRAVENOUS

## 2018-01-25 MED ORDER — MIDAZOLAM HCL 2 MG/2ML IJ SOLN
INTRAMUSCULAR | Status: AC
  Filled 2018-01-25: qty 2

## 2018-01-25 MED ORDER — LIDOCAINE HCL 1 % IJ SOLN
INTRAMUSCULAR | Status: AC
  Filled 2018-01-25: qty 20

## 2018-01-25 NOTE — Progress Notes (Signed)
    [place] for Infectious Disease   Reason for visit: Follow up on pelvic osteomyelitis, abscesses  Interval History: drain placed; culture with beta-lactamase positive Peptostreptococcus and anaerobic GNR  Physical Exam: Constitutional:  Vitals:   01/25/18 1143 01/25/18 1332  BP: 111/65 107/60  Pulse: 92 100  Resp:  17  Temp: 99.1 F (37.3 C) 98.4 F (36.9 C)  SpO2: 96% 98%   patient appears in NAD  Impression: osteomyelitis with abscess.  Will need 6 weeks of IV therapy possibly followed by oral continuation. Can use Unasyn or daily ertapenem at discharge.  I will change to unasyn now.    Plan: 1.  Unasyn 6 weeks of IV therapy

## 2018-01-25 NOTE — Plan of Care (Signed)
  Problem: Pain Managment: Goal: General experience of comfort will improve Outcome: Progressing   Problem: Safety: Goal: Ability to remain free from injury will improve Outcome: Progressing   Problem: Skin Integrity: Goal: Risk for impaired skin integrity will decrease Outcome: Progressing   

## 2018-01-25 NOTE — Progress Notes (Signed)
PROGRESS NOTE    Anthony Dunlap  RWE:[id number] DOB: 1933/11/23 DOA: 01/17/2018 PCP: [name], MD  Brief Narrative: 84 y.o.malewith medical history significant forhypothyroidism, insulin-dependent diabetes mellitus, hyperlipidemia, and history of prostate cancer with chronic Foley catheter, now presenting to the emergency department for evaluation of rigors. Patient reports that he was in his usual state of health until overnight when he began sweating and developed shaking chills. In the ED, pt was febrile with HR in 140s. He also had serum glucose of 440 with anion gap of 12. He was initially started on IVF and IV insulin. He denies any recent cough, shortness of breath, chest pain, vomiting, or diarrhea. Patient was admitted for sepsis due topresumptive UTI initially. Urine culture was not revealing. Blood cultures grew peptostreptococcus and Bacteroides thetaiodomicron. Antibiotics were adjusted  Assessment & Plan:   Principal Problem:   Sepsis secondary to UTI [facility]) Active Problems:   Hypothyroidism   DKA (diabetic ketoacidoses) ([name])   Normocytic anemia   Sepsis due to Bacteroides species ([name])   Sepsis without acute organ dysfunction ([name])   Psoas abscess ([name])  #1 sepsis secondary to bacteremia with Peptostreptococcus and bacteroids.  CT scan of the abdomen and pelvis showing multiple intramuscular abscesses.  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 Echocardiogram 01/19/2018 no evidence of vegetation.Follow-up blood cultures negative.S/P IR ct guided drainage of right rectus abscess.  #2 uncontrolled type 2 diabetes upon presentation patient was in DKA which was treated with protocol.  Hemoglobin A1c 9.3.  Continue Levemir 20 units and SSI.  #3 hypothyroidism continue Synthroid  #4 history of prostate cancer and chronic Foley catheter due to change it sometime in December 2019.  #5 hyperlipidemia continue statin.  #6 hyponatremia improved   #7  hypokalemia-repleted.recheck.         Estimated body mass index is 24.44 kg/m as calculated from the following:   Height as of this encounter: 5\' 8"  (1.727 m).   Weight as of this encounter: 72.9 kg.  DVT prophylaxis:lovenox Code Statusfull Family Communication:none Disposition Plan:pending clinical improvement pt consult pending   Consultants: ir,ortho,id  Procedures:ir drainage of abscess Antimicrobials: unasyn Subjective:resting in bed foley in place c/o pain pubic upper right thigh  Objective: Vitals:   01/25/18 1105 01/25/18 1120 01/25/18 1143 01/25/18 1332  BP: 121/69 120/69 111/65 107/60  Pulse: 100 98 92 100  Resp: 18 20  17   Temp:   99.1 F (37.3 C) 98.4 F (36.9 C)  TempSrc:   Oral Oral  SpO2: 98% 97% 96% 98%  Weight:      Height:        Intake/Output Summary (Last 24 hours) at 01/25/2018 1404 Last data filed at 01/25/2018 0850 Gross per 24 hour  Intake 1835.26 ml  Output 1950 ml  Net -114.74 ml   Filed Weights   01/17/18 0209 01/17/18 0600 01/23/18 2337  Weight: 70.3 kg 67.1 kg 72.9 kg    Examination:  General exam: Appears calm and comfortable  Respiratory system: Clear to auscultation. Respiratory effort normal. Cardiovascular system: S1 & S2 heard, RRR. No JVD, murmurs, rubs, gallops or clicks. No pedal edema. Gastrointestinal system: Abdomen is nondistended, soft and nontender. No organomegaly or masses felt. Normal bowel sounds heard. Central nervous system: Alert and oriented. No focal neurological deficits. Extremities: left upper thigh tender swollen,swollen scrotum Skin: No rashes, lesions or ulcers Psychiatry: Judgement and insight appear normal. Mood & affect appropriate.     Data Reviewed: I have personally reviewed following labs and imaging studies  CBC: Recent Labs  Lab 01/20/18 0547 01/22/18 0548 01/23/18 0520 01/25/18 0348  WBC 16.0* 16.8* 20.2* 23.2*  NEUTROABS  --   --   --  19.6*  HGB 8.0* 8.5* 8.0* 7.9*  HCT  26.2* 28.0* 26.0* 25.7*  MCV 81.4 80.0 78.8* 77.6*  PLT 549* 573* 613* 160*   Basic Metabolic Panel: Recent Labs  Lab 01/20/18 0547 01/22/18 0548 01/23/18 0520 01/25/18 0348  NA 132* 133* 126* 130*  K 3.5 3.4* 3.2* 2.7*  CL 100 99 92* 94*  CO2 25 26 24 24   GLUCOSE 92 78 250* 186*  BUN 13 15 14 12   CREATININE 0.77 0.77 0.70 0.96  CALCIUM 7.8* 7.9* 7.2* 7.4*   GFR: Estimated Creatinine Clearance: 57.4 mL/min (by C-G formula based on SCr of 0.96 mg/dL). Liver Function Tests: No results for input(s): AST, ALT, ALKPHOS, BILITOT, PROT, ALBUMIN in the last 168 hours. No results for input(s): LIPASE, AMYLASE in the last 168 hours. No results for input(s): AMMONIA in the last 168 hours. Coagulation Profile: Recent Labs  Lab 01/25/18 0348  INR 1.15   Cardiac Enzymes: No results for input(s): CKTOTAL, CKMB, CKMBINDEX, TROPONINI in the last 168 hours. BNP (last 3 results) No results for input(s): PROBNP in the last 8760 hours. HbA1C: No results for input(s): HGBA1C in the last 72 hours. CBG: Recent Labs  Lab 01/24/18 1205 01/24/18 1723 01/24/18 2107 01/25/18 0757 01/25/18 1201  GLUCAP 138* 207* 260* 95 69*   Lipid Profile: No results for input(s): CHOL, HDL, LDLCALC, TRIG, CHOLHDL, LDLDIRECT in the last 72 hours. Thyroid Function Tests: Recent Labs    01/25/18 0349  TSH 3.912   Anemia Panel: No results for input(s): VITAMINB12, FOLATE, FERRITIN, TIBC, IRON, RETICCTPCT in the last 72 hours. Sepsis Labs: No results for input(s): PROCALCITON, LATICACIDVEN in the last 168 hours.  Recent Results (from the past 240 hour(s))  Urine culture     Status: Abnormal   Collection Time: 01/17/18  2:23 AM  Result Value Ref Range Status   Specimen Description   Final    URINE, CATHETERIZED Performed at [facility], [address] [place], [place] [address]    Special Requests   Final    Normal Performed at [facility], [address], [place], [place] [address]    Culture  MULTIPLE SPECIES PRESENT, SUGGEST RECOLLECTION (A)  Final   Report Status 01/18/2018 FINAL  Final  Blood Culture (routine x 2)     Status: Abnormal   Collection Time: 01/17/18  2:40 AM  Result Value Ref Range Status   Specimen Description   Final    BLOOD RIGHT FOREARM Performed at [facility], [address], [place], [place] [address]    Special Requests   Final    BOTTLES DRAWN AEROBIC AND ANAEROBIC Blood Culture results may not be optimal due to an excessive volume of blood received in culture bottles Performed at [place] [place], [place] [address]    Culture  Setup Time   Final    GRAM POSITIVE COCCI ANAEROBIC BOTTLE Gram Stain Report Called to,Read Back By and Verified With: HYATT,L@1344  BY MATTHEWS,B 11.30.19 [facility] Organism ID to follow CRITICAL RESULT CALLED TO, READ BACK BY AND VERIFIED WITH: Cletis Media RN [id number] BY GF    Culture (A)  Final    PEPTOSTREPTOCOCCUS ASACCHAROLYTICUS BACTEROIDES THETAIOTAOMICRON BETA LACTAMASE POSITIVE Performed at [facility], [place] [address], [place], [place] [address]    Report Status  01/22/2018 FINAL  Final  Blood Culture ID Panel (Reflexed)     Status: None   Collection Time: 01/17/18  2:40 AM  Result Value Ref Range Status   Enterococcus species NOT DETECTED NOT DETECTED Final   Listeria monocytogenes NOT DETECTED NOT DETECTED Final   Staphylococcus species NOT DETECTED NOT DETECTED Final   Staphylococcus aureus (BCID) NOT DETECTED NOT DETECTED Final   Streptococcus species NOT DETECTED NOT DETECTED Final   Streptococcus agalactiae NOT DETECTED NOT DETECTED Final   Streptococcus pneumoniae NOT DETECTED NOT DETECTED Final   Streptococcus pyogenes NOT DETECTED NOT DETECTED Final   Acinetobacter baumannii NOT DETECTED NOT DETECTED Final   Enterobacteriaceae species NOT DETECTED NOT DETECTED Final   Enterobacter cloacae complex NOT DETECTED NOT DETECTED Final   Escherichia coli NOT DETECTED NOT  DETECTED Final   Klebsiella oxytoca NOT DETECTED NOT DETECTED Final   Klebsiella pneumoniae NOT DETECTED NOT DETECTED Final   Proteus species NOT DETECTED NOT DETECTED Final   Serratia marcescens NOT DETECTED NOT DETECTED Final   Haemophilus influenzae NOT DETECTED NOT DETECTED Final   Neisseria meningitidis NOT DETECTED NOT DETECTED Final   Pseudomonas aeruginosa NOT DETECTED NOT DETECTED Final   Candida albicans NOT DETECTED NOT DETECTED Final   Candida glabrata NOT DETECTED NOT DETECTED Final   Candida krusei NOT DETECTED NOT DETECTED Final   Candida parapsilosis NOT DETECTED NOT DETECTED Final   Candida tropicalis NOT DETECTED NOT DETECTED Final    Comment: Performed at [facility], [place] [address] [place], [place] [address]  Blood Culture (routine x 2)     Status: Abnormal   Collection Time: 01/17/18  2:59 AM  Result Value Ref Range Status   Specimen Description   Final    BLOOD LEFT ANTECUBITAL Performed at [facility], [address] [place], [place] [address]    Special Requests   Final    BOTTLES DRAWN AEROBIC AND ANAEROBIC Blood Culture adequate volume Performed at [facility], [address] [place], [place] [address]    Culture  Setup Time   Final    GRAM POSITIVE COCCI Gram Stain Report Called to,Read Back By and Verified With: MOTLEY @ [id number] ON [id number] BY HENDERSON L. Performed at [facility], [address], [place], [place] [address]    Culture (A)  Final    PEPTOSTREPTOCOCCUS ASACCHAROLYTICUS ANAEROBIC GRAM NEGATIVE ROD BETA LACTAMASE POSITIVE Performed at [facility], [place] [address], [place], [place] [address]    Report Status 01/22/2018 FINAL  Final  MRSA PCR Screening     Status: Abnormal   Collection Time: 01/17/18  5:56 AM  Result Value Ref Range Status   MRSA by PCR POSITIVE (A) NEGATIVE Final    Comment:        The GeneXpert MRSA Assay (FDA approved for NASAL specimens only), is one component of a comprehensive MRSA  colonization surveillance program. It is not intended to diagnose MRSA infection nor to guide or monitor treatment for MRSA infections. RESULT CALLED TO, READ BACK BY AND VERIFIED WITH: EVANS,H @ 7972 ON 01/17/18 BY JUW Performed at [facility], [address] [place], [place] [address]   Culture, Urine     Status: None   Collection Time: 01/19/18 11:04 AM  Result Value Ref Range Status   Specimen Description   Final    URINE, CLEAN CATCH Performed at [facility], [address] [place], [place] [address]    Special Requests   Final  NONE Performed at [facility], [address], [place], [place] [address]    Culture   Final    Multiple bacterial morphotypes present, none predominant. Suggest appropriate recollection if clinically indicated.   Report Status 01/20/2018 FINAL  Final  Culture, blood (Routine X 2) w Reflex to ID Panel     Status: None   Collection Time: 01/19/18 11:26 AM  Result Value Ref Range Status   Specimen Description RIGHT ANTECUBITAL  Final   Special Requests   Final    BOTTLES DRAWN AEROBIC AND ANAEROBIC Blood Culture adequate volume   Culture   Final    NO GROWTH 5 DAYS Performed at [facility], [address] [place], [place] [address]    Report Status 01/24/2018 FINAL  Final  Culture, blood (Routine X 2) w Reflex to ID Panel     Status: None   Collection Time: 01/19/18 11:37 AM  Result Value Ref Range Status   Specimen Description BLOOD LEFT HAND  Final   Special Requests   Final    BOTTLES DRAWN AEROBIC AND ANAEROBIC Blood Culture adequate volume   Culture   Final    NO GROWTH 5 DAYS Performed at [facility], [address] [place], [place] [address]    Report Status 01/24/2018 FINAL  Final  Aerobic/Anaerobic Culture (surgical/deep wound)     Status: None (Preliminary result)   Collection Time: 01/25/18 11:19 AM  Result Value Ref Range Status   Specimen Description ABSCESS  Final   Special Requests Normal  Final   Gram Stain   Final     ABUNDANT WBC PRESENT, PREDOMINANTLY PMN MODERATE GRAM POSITIVE RODS MODERATE GRAM POSITIVE COCCI FEW GRAM NEGATIVE RODS Performed at [facility], [place] [address], [place], [place] [address]    Culture PENDING  Incomplete   Report Status PENDING  Incomplete         Radiology Studies: Ct Image Guided Drainage By Percutaneous Catheter  Result Date: 01/25/2018 CLINICAL DATA:  Multiple abscesses including a lower right rectus sheath abscess. EXAM: CT GUIDED CATHETER DRAINAGE OF RIGHT RECTUS SHEATH ABSCESS ANESTHESIA/SEDATION: 2.0 mg IV Versed 100 mcg IV Fentanyl Total Moderate Sedation Time:  15 minutes The patient's level of consciousness and physiologic status were continuously monitored during the procedure by Radiology nursing. PROCEDURE: The procedure, risks, benefits, and alternatives were explained to the patient. Questions regarding the procedure were encouraged and answered. The patient understands and consents to the procedure. A time out was performed prior to initiating the procedure. CT was performed through the lower abdomen and pelvis in a supine position. The abdominal wall was prepped with chlorhexidine in a sterile fashion, and a sterile drape was applied covering the operative field. A sterile gown and sterile gloves were used for the procedure. Local anesthesia was provided with 1% Lidocaine. Under CT guidance, an 18 gauge trocar needle was advanced into the right lower rectus sheath musculature. A guidewire was advanced and the needle removed. The tract was dilated and a 10 French percutaneous drain placed. Drain placement was confirmed by CT. The drainage catheter was flushed and connected to a suction bulb. A sample of fluid was sent for culture analysis. COMPLICATIONS: None FINDINGS: Aspiration at the level of the right rectus sheath abscess revealed purulent fluid. A fluid sample was sent for culture analysis. After placement of a 10 French drain, there is further  return of purulent fluid. IMPRESSION: CT-guided drainage of right rectus sheath abscess yielding purulent fluid. A 10 French drainage  catheter was placed and attached to suction bulb drainage. Electronically Signed   By: Aletta Edouard M.D.   On: 01/25/2018 13:26        Scheduled Meds: . aspirin EC  81 mg Oral Daily  . [START ON 01/26/2018] enoxaparin (LOVENOX) injection  40 mg Subcutaneous Q24H  . feeding supplement (GLUCERNA SHAKE)  237 mL Oral BID BM  . fentaNYL      . insulin aspart  0-9 Units Subcutaneous TID WC  . insulin detemir  17 Units Subcutaneous QHS  . levothyroxine  25 mcg Oral Q0600  . lidocaine      . midazolam      . pravastatin  10 mg Oral q1800  . sodium chloride flush  5 mL Intracatheter Q8H   Continuous Infusions: . sodium chloride Stopped (01/25/18 0105)  . 0.9 % NaCl with KCl 20 mEq / L 75 mL/hr at 01/25/18 0812  . piperacillin-tazobactam (ZOSYN)  IV 3.375 g (01/25/18 0843)     LOS: 8 days     Georgette Shell, MD [facility]  If 7PM-7AM, please contact night-coverage [email or web address] Password [email or web address] 01/25/2018, 2:04 PM

## 2018-01-25 NOTE — Progress Notes (Signed)
@  2134 Pt noted with temp--101.4 PRN Tylenol 650mg  given. On call X. Blount,NP made aware via text page.

## 2018-01-25 NOTE — Procedures (Signed)
Interventional Radiology Procedure Note  Procedure: CT Guided Drainage of right rectus abscess  Complications: None  Estimated Blood Loss: < 10 mL  Findings: 10 Fr drain placed in right rectus abscess with return of purulent fluid. Fluid sample sent for culture analysis. Drain attached to suction bulb drainage.  Will follow.  Venetia Night. Kathlene Cote, M.D Pager:  [phone]

## 2018-01-25 NOTE — Progress Notes (Signed)
Pharmacy Antibiotic Note  Anthony Dunlap is a 84 y.o. male admitted on 01/17/2018 with intra-abdominal infection/ osteomyelitis.  Currently on IV vancomycin and Zosyn. ID is now de-escalating to Unasyn.  Plan: -Unasyn 3 gm IV Q 6 hours -Per ID, will need 6 weeks if IV abx therapy followed by possible transition to oral antibiotics   Height: 5\' 8"  (172.7 cm) Weight: 160 lb 11.5 oz (72.9 kg) IBW/kg (Calculated) : 68.4  Temp (24hrs), Avg:98.5 F (36.9 C), Min:97.3 F (36.3 C), Max:99.1 F (37.3 C)  Recent Labs  Lab 01/20/18 0547 01/22/18 0548 01/23/18 0520 01/25/18 0348  WBC 16.0* 16.8* 20.2* 23.2*  CREATININE 0.77 0.77 0.70 0.96    Estimated Creatinine Clearance: 57.4 mL/min (by C-G formula based on SCr of 0.96 mg/dL).    No Known Allergies  Antimicrobials this admission: 12/7 Unasyn>>  Vanco 12/5 >>12/7  Zosyn 12/5 >> 12/7 Cefepime 11/29 >> 12/1 Cefazolin 12/1>> 12/3 PCN G 12/3>> 12/04  Flagyl 12/4 >>12/5   Dose adjustments this admission: N/A  Microbiology results: 12/1 Edgard: no growth 11/29BC x2:  --PEPTOSTREPTOCOCCUS ASACCHAROLYTICUS  BACTEROIDES THETAIOTAOMICRON  BETA LACTAMASE POSITIVE 112/1UCx:  no growth 11/29MRSA PCR: + positive  Albertina Parr, PharmD., BCPS Clinical Pharmacist Clinical phone for 01/25/18 until 3:30pm: [phone]

## 2018-01-25 NOTE — Progress Notes (Addendum)
CRITICAL VALUE ALERT  Critical Value:  K=2.7  Date & Time Notied:  01/25/18; 4483   Provider Notified: Dr. Silas [place]  Orders Received/Actions taken: Awaiting further orders  Received order for 40 mEq Klor-con tabs Q2 x 3  Administered 40 mEq Klor-con tabs per order.  Will endorse to day shift RN appropriately.

## 2018-01-26 ENCOUNTER — Inpatient Hospital Stay (HOSPITAL_COMMUNITY): Payer: Medicare Other

## 2018-01-26 LAB — BASIC METABOLIC PANEL
Anion gap: 13 (ref 5–15)
BUN: 11 mg/dL (ref 8–23)
CO2: 24 mmol/L (ref 22–32)
Calcium: 7.7 mg/dL — ABNORMAL LOW (ref 8.9–10.3)
Chloride: 97 mmol/L — ABNORMAL LOW (ref 98–111)
Creatinine, Ser: 0.79 mg/dL (ref 0.61–1.24)
GFR calc Af Amer: >60 mL/min (ref >60)
GFR calc non Af Amer: >60 mL/min (ref >60)
GLUCOSE: 140 mg/dL — AB (ref 70–99)
Potassium: 3.9 mmol/L (ref 3.5–5.1)
Sodium: 134 mmol/L — ABNORMAL LOW (ref 135–145)

## 2018-01-26 LAB — CBC
HCT: 29.4 % — ABNORMAL LOW (ref 39.0–52.0)
Hemoglobin: 8.7 g/dL — ABNORMAL LOW (ref 13.0–17.0)
MCH: 23.8 pg — ABNORMAL LOW (ref 26.0–34.0)
MCHC: 29.6 g/dL — ABNORMAL LOW (ref 30.0–36.0)
MCV: 80.3 fL (ref 80.0–100.0)
Platelets: 762 10*3/uL — ABNORMAL HIGH (ref 150–400)
RBC: 3.66 MIL/uL — ABNORMAL LOW (ref 4.22–5.81)
RDW: 14.5 % (ref 11.5–15.5)
WBC: 23.8 10*3/uL — ABNORMAL HIGH (ref 4.0–10.5)
nRBC: 0 % (ref 0.0–0.2)

## 2018-01-26 LAB — GLUCOSE, CAPILLARY
GLUCOSE-CAPILLARY: 210 mg/dL — AB (ref 70–99)
Glucose-Capillary: 88 mg/dL (ref 70–99)
Glucose-Capillary: 246 mg/dL — ABNORMAL HIGH (ref 70–99)
Glucose-Capillary: 162 mg/dL — ABNORMAL HIGH (ref 70–99)

## 2018-01-26 MED ORDER — IOHEXOL 300 MG/ML  SOLN
100.0000 mL | Freq: Once | INTRAMUSCULAR | Status: AC | PRN
  Administered 2018-01-26: 100 mL via INTRAVENOUS

## 2018-01-26 NOTE — Progress Notes (Signed)
PROGRESS NOTE    Anthony Dunlap  OZH:[id number] DOB: [birth year] DOA: 01/17/2018 PCP: [name], MD  Brief Narrative: 84 y.o.malewith medical history significant forhypothyroidism, insulin-dependent diabetes mellitus, hyperlipidemia, and history of prostate cancer with chronic Foley catheter, now presenting to the emergency department for evaluation of rigors. Patient reports that he was in his usual state of health until overnight when he began sweating and developed shaking chills. In the ED, pt was febrile with HR in 140s. He also had serum glucose of 440 with anion gap of 12. He was initially started on IVF and IV insulin. He denies any recent cough, shortness of breath, chest pain, vomiting, or diarrhea. Patient was admitted for sepsis due topresumptive UTI initially. Urine culture was not revealing. Blood cultures grew peptostreptococcus and Bacteroides thetaiodomicron. Antibiotics were adjusted  Assessment & Plan:   Principal Problem:   Sepsis secondary to UTI [facility]) Active Problems:   Hypothyroidism   DKA (diabetic ketoacidoses) ([name])   Normocytic anemia   Sepsis due to Bacteroides species ([name])   Sepsis without acute organ dysfunction ([name])   Psoas abscess ([name])  #1 sepsis secondary to bacteremia with Peptostreptococcus and bacteroids. CT scan of the abdomen and pelvis showing multiple intramuscular abscesses. 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 Small abscess arising off the pubic symphysis is also noted with subtle bone loss and cortical bone destruction raising concern for septic  arthritis and osteomyelitis of the pubic bone.  Left greater than right small pleural effusions with atelectasis.  Bilateral renal cysts.  Patient continues to have leukocytosis in fact his leukocytosis worsened 23.8 with fever of 101.4.  Will repeat CT scan of the abdomen and pelvis with upper part of the both thighs.  His left thigh is swollen tender and erythematous.  #2 uncontrolled type 2 diabetes upon presentation patient was in DKA which was treated with protocol. Hemoglobin A1c 9.3. Continue Levemir 20 units and SSI.  #3 hypothyroidism continue Synthroid  #4 history of prostate cancer and chronic Foley catheter due to change it sometime in December 2019.  #5 hyperlipidemia continue statin.  #6 hyponatremia improved   #7 hypokalemia-repleted.recheck.       Estimated body mass index is 24.44 kg/m as calculated from the following:   Height as of this encounter: 5\' 8"  (1.727 m).   Weight as of this encounter: 72.9 kg.  DVT prophylaxis: Lovenox Code Status:  full code Family Communication: None Disposition Plan: Pending clinical improvement patient still febrile he is at high risk for sepsis however he is anxious to go home  Consultants: Infectious disease aND interventional radiology   Procedures: Status post IR CT-guided insertion of drainage of right rectus sheath pus Antimicrobials Unasyn  Subjective: Patient resting in bed he is eager to go home complaints of pain in the belly lower pubic area and swelling of scrotum  Objective: Vitals:   01/25/18 1332 01/25/18 2112 01/25/18 2258 01/26/18 0514  BP: 107/60 119/71  137/73  Pulse: 100 (!) 105  95  Resp: 17 18  18   Temp: 98.4 F (36.9 C) (!) 101.4 F (38.6 C) 98.9 F (37.2 C) 98.2 F (36.8 C)  TempSrc: Oral Oral Oral Oral  SpO2: 98%  96%  97%  Weight:      Height:        Intake/Output Summary (Last 24 hours) at 01/26/2018 1509 Last data filed at 01/26/2018 0519 Gross per 24 hour  Intake 920 ml    Output 1368 ml  Net -448 ml   Filed Weights   01/17/18 0209 01/17/18 0600 01/23/18 2337  Weight: 70.3 kg 67.1 kg 72.9 kg    Examination:  General exam: Appears calm and comfortable  Respiratory system: Clear to auscultation. Respiratory effort normal. Cardiovascular system: S1 & S2 heard, RRR. No JVD, murmurs, rubs, gallops or clicks. No pedal edema. Gastrointestinal system: Abdomen is nondistended, soft and nontender. No organomegaly or masses felt. Normal bowel sounds heard. Central nervous system: Alert and oriented. No focal neurological deficits. Extremities: Left upper thigh on the anterior aspect is swollen red and tender Skin: No rashes, lesions or ulcers Psychiatry: Judgement and insight appear normal. Mood & affect appropriate.     Data Reviewed: I have personally reviewed following labs and imaging studies  CBC: Recent Labs  Lab 01/20/18 0547 01/22/18 0548 01/23/18 0520 01/25/18 0348 01/26/18 0322  WBC 16.0* 16.8* 20.2* 23.2* 23.8*  NEUTROABS  --   --   --  19.6*  --   HGB 8.0* 8.5* 8.0* 7.9* 8.7*  HCT 26.2* 28.0* 26.0* 25.7* 29.4*  MCV 81.4 80.0 78.8* 77.6* 80.3  PLT 549* 573* 613* 678* 025*   Basic Metabolic Panel: Recent Labs  Lab 01/20/18 0547 01/22/18 0548 01/23/18 0520 01/25/18 0348 01/26/18 0322  NA 132* 133* 126* 130* 134*  K 3.5 3.4* 3.2* 2.7* 3.9  CL 100 99 92* 94* 97*  CO2 25 26 24 24 24   GLUCOSE 92 78 250* 186* 140*  BUN 13 15 14 12 11   CREATININE 0.77 0.77 0.70 0.96 0.79  CALCIUM 7.8* 7.9* 7.2* 7.4* 7.7*   GFR: Estimated Creatinine Clearance: 68.9 mL/min (by C-G formula based on SCr of 0.79 mg/dL). Liver Function Tests: No results for input(s): AST, ALT, ALKPHOS, BILITOT, PROT, ALBUMIN in the last 168 hours. No results for input(s): LIPASE, AMYLASE in the last 168 hours. No results for input(s): AMMONIA in the last 168 hours. Coagulation Profile: Recent Labs  Lab 01/25/18 0348  INR 1.15   Cardiac Enzymes: No results for  input(s): CKTOTAL, CKMB, CKMBINDEX, TROPONINI in the last 168 hours. BNP (last 3 results) No results for input(s): PROBNP in the last 8760 hours. HbA1C: No results for input(s): HGBA1C in the last 72 hours. CBG: Recent Labs  Lab 01/25/18 1201 01/25/18 1702 01/25/18 2116 01/26/18 0753 01/26/18 1200  GLUCAP 69* 195* 211* 88 210*   Lipid Profile: No results for input(s): CHOL, HDL, LDLCALC, TRIG, CHOLHDL, LDLDIRECT in the last 72 hours. Thyroid Function Tests: Recent Labs    01/25/18 0349  TSH 3.912   Anemia Panel: No results for input(s): VITAMINB12, FOLATE, FERRITIN, TIBC, IRON, RETICCTPCT in the last 72 hours. Sepsis Labs: No results for input(s): PROCALCITON, LATICACIDVEN in the last 168 hours.  Recent Results (from the past 240 hour(s))  Urine culture     Status: Abnormal   Collection Time: 01/17/18  2:23 AM  Result Value Ref Range Status   Specimen Description   Final    URINE, CATHETERIZED Performed at [facility], [address], [place], [place] [address]    Special Requests   Final    Normal Performed at [facility], [address], [place], [place] [address]    Culture MULTIPLE SPECIES PRESENT, SUGGEST  RECOLLECTION (A)  Final   Report Status 01/18/2018 FINAL  Final  Blood Culture (routine x 2)     Status: Abnormal   Collection Time: 01/17/18  2:40 AM  Result Value Ref Range Status   Specimen Description   Final    BLOOD RIGHT FOREARM Performed at [facility], [address] [place], [place] [address]    Special Requests   Final    BOTTLES DRAWN AEROBIC AND ANAEROBIC Blood Culture results may not be optimal due to an excessive volume of blood received in culture bottles Performed at [place] [place], [place] [address]    Culture  Setup Time   Final    GRAM POSITIVE COCCI ANAEROBIC BOTTLE Gram Stain Report Called to,Read Back By and Verified With: HYATT,L@1344  BY MATTHEWS,B 11.30.19 [facility] Organism ID to follow CRITICAL  RESULT CALLED TO, READ BACK BY AND VERIFIED WITH: Cletis Media RN [id number] BY GF    Culture (A)  Final    PEPTOSTREPTOCOCCUS ASACCHAROLYTICUS BACTEROIDES THETAIOTAOMICRON BETA LACTAMASE POSITIVE Performed at [facility], [place] [address], [place], [place] [address]    Report Status 01/22/2018 FINAL  Final  Blood Culture ID Panel (Reflexed)     Status: None   Collection Time: 01/17/18  2:40 AM  Result Value Ref Range Status   Enterococcus species NOT DETECTED NOT DETECTED Final   Listeria monocytogenes NOT DETECTED NOT DETECTED Final   Staphylococcus species NOT DETECTED NOT DETECTED Final   Staphylococcus aureus (BCID) NOT DETECTED NOT DETECTED Final   Streptococcus species NOT DETECTED NOT DETECTED Final   Streptococcus agalactiae NOT DETECTED NOT DETECTED Final   Streptococcus pneumoniae NOT DETECTED NOT DETECTED Final   Streptococcus pyogenes NOT DETECTED NOT DETECTED Final   Acinetobacter baumannii NOT DETECTED NOT DETECTED Final   Enterobacteriaceae species NOT DETECTED NOT DETECTED Final   Enterobacter cloacae complex NOT DETECTED NOT DETECTED Final   Escherichia coli NOT DETECTED NOT DETECTED Final   Klebsiella oxytoca NOT DETECTED NOT DETECTED Final   Klebsiella pneumoniae NOT DETECTED NOT DETECTED Final   Proteus species NOT DETECTED NOT DETECTED Final   Serratia marcescens NOT DETECTED NOT DETECTED Final   Haemophilus influenzae NOT DETECTED NOT DETECTED Final   Neisseria meningitidis NOT DETECTED NOT DETECTED Final   Pseudomonas aeruginosa NOT DETECTED NOT DETECTED Final   Candida albicans NOT DETECTED NOT DETECTED Final   Candida glabrata NOT DETECTED NOT DETECTED Final   Candida krusei NOT DETECTED NOT DETECTED Final   Candida parapsilosis NOT DETECTED NOT DETECTED Final   Candida tropicalis NOT DETECTED NOT DETECTED Final    Comment: Performed at [facility], [place] [address], [place], [place] [address]  Blood Culture (routine x 2)     Status: Abnormal    Collection Time: 01/17/18  2:59 AM  Result Value Ref Range Status   Specimen Description   Final    BLOOD LEFT ANTECUBITAL Performed at [facility], [address] [place], [place] [address]    Special Requests   Final    BOTTLES DRAWN AEROBIC AND ANAEROBIC Blood Culture adequate volume Performed at [facility], [address], [place], [place] [address]    Culture  Setup Time   Final    GRAM POSITIVE COCCI Gram Stain Report Called to,Read Back By and Verified With: MOTLEY @ [id number] ON [id number] BY HENDERSON L. Performed at [facility], [address] [place], [place] [address]    Culture (A)  Final  PEPTOSTREPTOCOCCUS ASACCHAROLYTICUS ANAEROBIC GRAM NEGATIVE ROD BETA LACTAMASE POSITIVE Performed at [facility], [place] [address] [place], [place] [address]    Report Status 01/22/2018 FINAL  Final  MRSA PCR Screening     Status: Abnormal   Collection Time: 01/17/18  5:56 AM  Result Value Ref Range Status   MRSA by PCR POSITIVE (A) NEGATIVE Final    Comment:        The GeneXpert MRSA Assay (FDA approved for NASAL specimens only), is one component of a comprehensive MRSA colonization surveillance program. It is not intended to diagnose MRSA infection nor to guide or monitor treatment for MRSA infections. RESULT CALLED TO, READ BACK BY AND VERIFIED WITH: EVANS,H @ [id number] ON 01/17/18 BY JUW Performed at [facility], [address], [place], [place] [address]   Culture, Urine     Status: None   Collection Time: 01/19/18 11:04 AM  Result Value Ref Range Status   Specimen Description   Final    URINE, CLEAN CATCH Performed at [facility], [address], [place], [place] [address]    Special Requests   Final    NONE Performed at [facility], [address], [place], [place] [address]    Culture   Final    Multiple bacterial morphotypes present, none predominant. Suggest appropriate recollection if clinically indicated.   Report Status 01/20/2018 FINAL  Final  Culture,  blood (Routine X 2) w Reflex to ID Panel     Status: None   Collection Time: 01/19/18 11:26 AM  Result Value Ref Range Status   Specimen Description RIGHT ANTECUBITAL  Final   Special Requests   Final    BOTTLES DRAWN AEROBIC AND ANAEROBIC Blood Culture adequate volume   Culture   Final    NO GROWTH 5 DAYS Performed at [facility], [address], [place], [place] [address]    Report Status 01/24/2018 FINAL  Final  Culture, blood (Routine X 2) w Reflex to ID Panel     Status: None   Collection Time: 01/19/18 11:37 AM  Result Value Ref Range Status   Specimen Description BLOOD LEFT HAND  Final   Special Requests   Final    BOTTLES DRAWN AEROBIC AND ANAEROBIC Blood Culture adequate volume   Culture   Final    NO GROWTH 5 DAYS Performed at [facility], [address] [place], [place] [address]    Report Status 01/24/2018 FINAL  Final  Aerobic/Anaerobic Culture (surgical/deep wound)     Status: None (Preliminary result)   Collection Time: 01/25/18 11:19 AM  Result Value Ref Range Status   Specimen Description ABSCESS  Final   Special Requests Normal  Final   Gram Stain   Final    ABUNDANT WBC PRESENT, PREDOMINANTLY PMN MODERATE GRAM POSITIVE RODS MODERATE GRAM POSITIVE COCCI FEW GRAM NEGATIVE RODS    Culture   Final    CULTURE REINCUBATED FOR BETTER GROWTH Performed at [facility], [place] [address], [place],  [address]    Report Status PENDING  Incomplete         Radiology Studies: Ct Image Guided Drainage By Percutaneous Catheter  Result Date: 01/25/2018 CLINICAL DATA:  Multiple abscesses including a lower right rectus sheath abscess. EXAM: CT GUIDED CATHETER DRAINAGE OF RIGHT RECTUS SHEATH ABSCESS ANESTHESIA/SEDATION: 2.0 mg IV Versed 100 mcg IV Fentanyl Total Moderate Sedation Time:  15 minutes The patient's level of consciousness and physiologic status were continuously monitored during the procedure by Radiology nursing.  PROCEDURE: The procedure, risks,  benefits, and alternatives were explained to the patient. Questions regarding the procedure were encouraged and answered. The patient understands and consents to the procedure. A time out was performed prior to initiating the procedure. CT was performed through the lower abdomen and pelvis in a supine position. The abdominal wall was prepped with chlorhexidine in a sterile fashion, and a sterile drape was applied covering the operative field. A sterile gown and sterile gloves were used for the procedure. Local anesthesia was provided with 1% Lidocaine. Under CT guidance, an 18 gauge trocar needle was advanced into the right lower rectus sheath musculature. A guidewire was advanced and the needle removed. The tract was dilated and a 10 French percutaneous drain placed. Drain placement was confirmed by CT. The drainage catheter was flushed and connected to a suction bulb. A sample of fluid was sent for culture analysis. COMPLICATIONS: None FINDINGS: Aspiration at the level of the right rectus sheath abscess revealed purulent fluid. A fluid sample was sent for culture analysis. After placement of a 10 French drain, there is further return of purulent fluid. IMPRESSION: CT-guided drainage of right rectus sheath abscess yielding purulent fluid. A 10 French drainage catheter was placed and attached to suction bulb drainage. Electronically Signed   By: Aletta Edouard M.D.   On: 01/25/2018 13:26        Scheduled Meds: . aspirin EC  81 mg Oral Daily  . feeding supplement (GLUCERNA SHAKE)  237 mL Oral BID BM  . insulin aspart  0-9 Units Subcutaneous TID WC  . insulin detemir  17 Units Subcutaneous QHS  . levothyroxine  25 mcg Oral Q0600  . pravastatin  10 mg Oral q1800  . sodium chloride flush  5 mL Intracatheter Q8H   Continuous Infusions: . sodium chloride Stopped (01/25/18 0105)  . ampicillin-sulbactam (UNASYN) IV 3 g (01/26/18 1328)     LOS: 9 days     Georgette Shell, MD [facility]  If 7PM-7AM, please contact night-coverage [email or web address] Password [email or web address] 01/26/2018, 3:09 PM

## 2018-01-26 NOTE — Progress Notes (Signed)
Pt has had several incontinent stools today that are clear/jellied. Daughter stated that he has been having stools like this as well recently.

## 2018-01-26 NOTE — Progress Notes (Signed)
Referring Physician(s): Dr. Landis Gandy  Supervising Physician: Aletta Edouard  Patient Status:  [facility] - In-pt  Chief Complaint: Follow up rectus abscess drain placement 12/7 by Dr. Kathlene Cote.  Subjective:  Patient denies any complaints today, states he feels pretty good. Denies pain at drain site.   Allergies: Patient has no known allergies.  Medications: Prior to Admission medications   Medication Sig Start Date End Date Taking? Authorizing Provider  aspirin EC 81 MG tablet Take 81 mg by mouth daily.   Yes [provider]  LEVEMIR FLEXTOUCH 100 UNIT/ML Pen Inject 30 Units into the skin daily. 01/07/18  Yes Kathie Dike, MD  levothyroxine (SYNTHROID, LEVOTHROID) 25 MCG tablet Take 25 mcg by mouth daily before breakfast.   Yes [provider]  pravastatin (PRAVACHOL) 10 MG tablet Take 10 mg by mouth daily.   Yes [provider]     Vital Signs: BP 137/73 (BP Location: Right Arm)   Pulse 95   Temp 98.2 F (36.8 C) (Oral)   Resp 18   Ht 5\' 8"  (1.727 m)   Wt 160 lb 11.5 oz (72.9 kg)   SpO2 97%   BMI 24.44 kg/m   Physical Exam  Constitutional: No distress.  Sitting up in bed, pleasant, talkative.  Cardiovascular: Normal rate, regular rhythm and normal heart sounds.  Pulmonary/Chest: Effort normal and breath sounds normal.  Abdominal: Soft. He exhibits no distension. There is no tenderness.  Right sided abdominal drain to JP with scant purulent drainage. Insertion site clean, dry, intact, no drainage or bleeding. Suture and stat lock in place. Dressed appropriately.   Neurological: He is alert.  Skin: Skin is warm and dry. He is not diaphoretic.  Nursing note and vitals reviewed.   Imaging: Ct Abdomen Pelvis W Contrast  Result Date: 01/22/2018 CLINICAL DATA:  84 year old male presents with fever and tachycardia with anaerobic bacteremia. Abdominal pain and fever with abscess suspected. EXAM: CT ABDOMEN AND PELVIS WITH CONTRAST  TECHNIQUE: Multidetector CT imaging of the abdomen and pelvis was performed using the standard protocol following bolus administration of intravenous contrast. CONTRAST:  130mL ISOVUE-300 IOPAMIDOL (ISOVUE-300) INJECTION 61% COMPARISON:  None. FINDINGS: Lower chest: Heart size is top normal without pericardial effusion. Trace bilateral pleural effusions with adjacent atelectasis are identified at each lung base, left slightly greater than right. Hepatobiliary: 8 mm hypodensity in the subcapsular right hepatic lobe, too small to characterize but may reflect a small cyst or hemangioma. No enhancing mass or biliary dilatation. No abscess. The gallbladder is unremarkable and free of stones. Pancreas: Atrophic pancreas. No mass or ductal dilatation. Inflammation. Spleen: Normal size spleen without focal mass. Adrenals/Urinary Tract: Normal bilateral adrenal glands. Symmetric cortical enhancement both kidneys with bilateral renal cysts. No nephrolithiasis nor hydroureteronephrosis. The urinary bladder is decompressed by Foley catheter. Stomach/Bowel: Small hiatal hernia is noted. The stomach is nondistended. Small bowel rotation is within limits. Enteric contrast is seen within jejunal loops without mechanical bowel obstruction or inflammation. Moderate stool retention is seen within the cecum and ascending colon with moderate stool also noted in the descending colon. No mural thickening is identified. The rectosigmoid is decompressed in appearance and likely explains the slightly thickened appearance. The appendix is not well visualized. Vascular/Lymphatic: Mild scattered aortoiliac atherosclerosis without aneurysm. Mild reactive adenopathy suspected of the window nodes without significant pathologic enlargement. The largest contains fat and measure up to 12 mm on right and 10 mm on the left. Reproductive: Brachy therapy seeds are imbedded within the prostate.  Other: Soft tissue abscesses are identified about the pelvis  and left hip. Mottled gas like lucency with low attenuating fluid is noted along the course of the distal right rectus muscle measuring 7.4 x 2.5 x 3.2 cm, series 6/49 and series 2/76. Additional tracking low-density fluid with mottled gas like lucencies are noted along the course of the left iliopsoas muscle extending to the left hip. Intramuscular abscesses are noted of the pectineus and obturator externus muscles bilaterally, left more prominent than right as well as left quadratus femoris muscle. Soft tissue abscess contiguous in adjacent to pubic symphysis is identified, series 2/81 measuring 3.3 x 4.4 x 4.1 cm. Soft tissue anasarca is noted of the included abdomen pelvis. Small low-density subcutaneous fluid collections are noted along the lateral aspect of both hips without gas like lucencies, right measuring at least 3.7 x 3 cm and on the left 5.5 by 1.9 cm, series 2/86. Musculoskeletal: Subtle cortical bone loss and osteopenia noted along the inferior pubic bone bilaterally. Changes of osteomyelitis is of concern, series 2/87 on bone windows possibly from septic arthritis given presence of adjacent abscess. IMPRESSION: 1. The over arching finding is that of numerous intramuscular abscesses as stated above along the course of the distal right rectus, about the bilateral pectineus, obturator externus and left quadratus femoris muscles as well as left ileo psoas. 2. Small abscess arising off the pubic symphysis is also noted with subtle bone loss and cortical bone destruction raising concern for septic arthritis and osteomyelitis of the pubic bone. 3. Left greater than right small pleural effusions with atelectasis. 4. Bilateral renal cysts. These results were called by telephone at the time of interpretation on 01/22/2018 at 11:04 pm to NP Chaney Malling, who verbally acknowledged these results. Electronically Signed   By: Ashley Royalty M.D.   On: 01/22/2018 23:04   Ct Image Guided Drainage By Percutaneous  Catheter  Result Date: 01/25/2018 CLINICAL DATA:  Multiple abscesses including a lower right rectus sheath abscess. EXAM: CT GUIDED CATHETER DRAINAGE OF RIGHT RECTUS SHEATH ABSCESS ANESTHESIA/SEDATION: 2.0 mg IV Versed 100 mcg IV Fentanyl Total Moderate Sedation Time:  15 minutes The patient's level of consciousness and physiologic status were continuously monitored during the procedure by Radiology nursing. PROCEDURE: The procedure, risks, benefits, and alternatives were explained to the patient. Questions regarding the procedure were encouraged and answered. The patient understands and consents to the procedure. A time out was performed prior to initiating the procedure. CT was performed through the lower abdomen and pelvis in a supine position. The abdominal wall was prepped with chlorhexidine in a sterile fashion, and a sterile drape was applied covering the operative field. A sterile gown and sterile gloves were used for the procedure. Local anesthesia was provided with 1% Lidocaine. Under CT guidance, an 18 gauge trocar needle was advanced into the right lower rectus sheath musculature. A guidewire was advanced and the needle removed. The tract was dilated and a 10 French percutaneous drain placed. Drain placement was confirmed by CT. The drainage catheter was flushed and connected to a suction bulb. A sample of fluid was sent for culture analysis. COMPLICATIONS: None FINDINGS: Aspiration at the level of the right rectus sheath abscess revealed purulent fluid. A fluid sample was sent for culture analysis. After placement of a 10 French drain, there is further return of purulent fluid. IMPRESSION: CT-guided drainage of right rectus sheath abscess yielding purulent fluid. A 10 French drainage catheter was placed and attached to suction bulb drainage. Electronically Signed  By: Aletta Edouard M.D.   On: 01/25/2018 13:26    Labs:  CBC: Recent Labs    01/22/18 0548 01/23/18 0520 01/25/18 0348  01/26/18 0322  WBC 16.8* 20.2* 23.2* 23.8*  HGB 8.5* 8.0* 7.9* 8.7*  HCT 28.0* 26.0* 25.7* 29.4*  PLT 573* 613* 678* 762*    COAGS: Recent Labs    01/03/18 1929 01/17/18 0443 01/25/18 0348  INR 1.20 1.31 1.15  APTT  --  37*  --     BMP: Recent Labs    01/22/18 0548 01/23/18 0520 01/25/18 0348 01/26/18 0322  NA 133* 126* 130* 134*  K 3.4* 3.2* 2.7* 3.9  CL 99 92* 94* 97*  CO2 26 24 24 24   GLUCOSE 78 250* 186* 140*  BUN 15 14 12 11   CALCIUM 7.9* 7.2* 7.4* 7.7*  CREATININE 0.77 0.70 0.96 0.79  GFRNONAA >60 >60 >60 >60  GFRAA >60 >60 >60 >60    LIVER FUNCTION TESTS: Recent Labs    01/03/18 1929 01/05/18 0551 01/17/18 0237  BILITOT 1.0 0.3 0.7  AST 26 21 19   ALT 19 19 15   ALKPHOS 132* 141* 152*  PROT 7.8 7.1 7.2  ALBUMIN 3.1* 2.6* 2.5*    Assessment and Plan:  Patient s/p right rectus abscess drain placement 12/7 with Dr. Kathlene Cote. Patient states doing well overall. Minimal OP from drain - 18 cc recorded since insertion. Cultures of aspirate pending.  Tmax 101.4, WBC 23.8 (23.2 yesterday) - he continues on Unasyn IV. Hgb 8.7, plt 762.  Continue TID flushes with 3-5 cc NS, record drain output QD. IR will continue to follow.   Please call with questions or concerns.  Electronically Signed: Joaquim Nam, PA-C 01/26/2018, 10:41 AM   I spent a total of 15 Minutes at the the patient's bedside AND on the patient's hospital floor or unit, greater than 50% of which was counseling/coordinating care for rectus sheath abscess drain.

## 2018-01-27 DIAGNOSIS — K611 Rectal abscess: Secondary | ICD-10-CM

## 2018-01-27 DIAGNOSIS — A498 Other bacterial infections of unspecified site: Secondary | ICD-10-CM

## 2018-01-27 DIAGNOSIS — B952 Enterococcus as the cause of diseases classified elsewhere: Secondary | ICD-10-CM

## 2018-01-27 DIAGNOSIS — Z978 Presence of other specified devices: Secondary | ICD-10-CM

## 2018-01-27 DIAGNOSIS — L0291 Cutaneous abscess, unspecified: Secondary | ICD-10-CM

## 2018-01-27 DIAGNOSIS — L02416 Cutaneous abscess of left lower limb: Secondary | ICD-10-CM

## 2018-01-27 DIAGNOSIS — L02415 Cutaneous abscess of right lower limb: Secondary | ICD-10-CM

## 2018-01-27 LAB — BASIC METABOLIC PANEL
Anion gap: 10 (ref 5–15)
BUN: 10 mg/dL (ref 8–23)
CO2: 25 mmol/L (ref 22–32)
Calcium: 7.2 mg/dL — ABNORMAL LOW (ref 8.9–10.3)
Chloride: 94 mmol/L — ABNORMAL LOW (ref 98–111)
Creatinine, Ser: 0.79 mg/dL (ref 0.61–1.24)
GFR calc non Af Amer: >60 mL/min (ref >60)
Glucose, Bld: 180 mg/dL — ABNORMAL HIGH (ref 70–99)
Potassium: 3.5 mmol/L (ref 3.5–5.1)
Sodium: 129 mmol/L — ABNORMAL LOW (ref 135–145)

## 2018-01-27 LAB — CBC
HCT: 28 % — ABNORMAL LOW (ref 39.0–52.0)
Hemoglobin: 8.3 g/dL — ABNORMAL LOW (ref 13.0–17.0)
MCH: 23.5 pg — ABNORMAL LOW (ref 26.0–34.0)
MCHC: 29.6 g/dL — ABNORMAL LOW (ref 30.0–36.0)
MCV: 79.3 fL — ABNORMAL LOW (ref 80.0–100.0)
NRBC: 0 % (ref 0.0–0.2)
Platelets: 884 10*3/uL — ABNORMAL HIGH (ref 150–400)
RBC: 3.53 MIL/uL — AB (ref 4.22–5.81)
RDW: 14.6 % (ref 11.5–15.5)
WBC: 28.4 10*3/uL — ABNORMAL HIGH (ref 4.0–10.5)

## 2018-01-27 LAB — GLUCOSE, CAPILLARY
Glucose-Capillary: 61 mg/dL — ABNORMAL LOW (ref 70–99)
Glucose-Capillary: 114 mg/dL — ABNORMAL HIGH (ref 70–99)
Glucose-Capillary: 193 mg/dL — ABNORMAL HIGH (ref 70–99)
Glucose-Capillary: 198 mg/dL — ABNORMAL HIGH (ref 70–99)
Glucose-Capillary: 258 mg/dL — ABNORMAL HIGH (ref 70–99)

## 2018-01-27 MED ORDER — INSULIN DETEMIR 100 UNIT/ML ~~LOC~~ SOLN
12.0000 [IU] | Freq: Every day | SUBCUTANEOUS | Status: DC
  Administered 2018-01-27: 12 [IU] via SUBCUTANEOUS
  Filled 2018-01-27 (×2): qty 0.12

## 2018-01-27 NOTE — Progress Notes (Signed)
Inpatient Diabetes Program Recommendations  AACE/ADA: New Consensus Statement on Inpatient Glycemic Control (2019)  Target Ranges:  Prepandial:   less than 140 mg/dL      Peak postprandial:   less than 180 mg/dL (1-2 hours)      Critically ill patients:  140 - 180 mg/dL   Results for BADR, PIEDRA (MRN [id number]) as of 01/27/2018 11:44  Ref. Range 01/26/2018 07:53 01/26/2018 12:00 01/26/2018 17:16 01/26/2018 21:11 01/27/2018 08:11 01/27/2018 09:02  Glucose-Capillary Latest Ref Range: 70 - 99 mg/dL 88 210 (H) 246 (H) 162 (H) 61 (L) 114 (H)   Review of Glycemic Control  Current orders for Inpatient glycemic control: Levemir 17 units QHS, Novolog 0-9 units TID with meals  Inpatient Diabetes Program Recommendations:   Insulin - Basal: Noted fasting glucose 61 mg/dl this morning. Please consider decreasing Levemir to 15 units QHS.  Insulin-Meal Coverage: Please consider ordering Novolog 3 units TID with meals for meal coverage if patient eats at least 50% of meals.  Thanks, [name], RN, MSN, CDE Diabetes Coordinator Inpatient Diabetes Program [phone] (Team Pager from 8am to 5pm)

## 2018-01-27 NOTE — Progress Notes (Signed)
[facility] Infusion Coordinator will follow pt with ID team to support Home Infusion Pharmacy services at DC as ordered if DC is to home.   If patient discharges after hours, please call [phone].   Larry Sierras 01/27/2018, 10:02 AM

## 2018-01-27 NOTE — Progress Notes (Signed)
Patient ID: Anthony Dunlap, male   DOB: 05-11-1933, 84 y.o.   MRN: [id number]   Consult received from primary team.  Victorino December, MD to see tomorrow and plan to take to OR in pm  NPO after midnight for OR tomorrow Consent ordered

## 2018-01-27 NOTE — Progress Notes (Addendum)
  Another request for more image guided drain placements.  CT reviewed by Dr. Anselm Pancoast.  Too many fluid collections to place numerous drains.  Recommend surgery evaluation.  Message relayed to Dr. Coralee Pesa via [facility] paging.  Vicent Febles S Niambi Smoak PA-C 01/27/2018 4:31 PM

## 2018-01-27 NOTE — Progress Notes (Signed)
PROGRESS NOTE    [place]  JSE:[id number] DOB: 03-10-1933 DOA: 01/17/2018 PCP: [name], MD    Brief Narrative:  84 y.o.malewith medical history significant forhypothyroidism, insulin-dependent diabetes mellitus, hyperlipidemia, and history of prostate cancer with chronic Foley catheter, now presenting to the emergency department for evaluation of rigors. Patient reports that he was in his usual state of health until overnight when he began sweating and developed shaking chills. In the ED, pt was febrile with HR in 140s. He also had serum glucose of 440 with anion gap of 12. He was initially started on IVF and IV insulin. He denies any recent cough, shortness of breath, chest pain, vomiting, or diarrhea. Patient was admitted for sepsis due topresumptive UTI initially. Urine culture was not revealing. Blood cultures grew peptostreptococcus and Bacteroides thetaiodomicron. Antibiotics were adjusted  Assessment & Plan:   Principal Problem:   Sepsis secondary to UTI [facility]) Active Problems:   Hypothyroidism   DKA (diabetic ketoacidoses) ([name])   Normocytic anemia   Sepsis due to Bacteroides species ([name])   Sepsis without acute organ dysfunction ([name])   Psoas abscess ([name])   Enterococcus faecalis infection  #1 sepsis secondary to bacteremia with Peptostreptococcus and bacteroids.CT scan of the abdomen and pelvis showing multiple intramuscular abscesses. 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 Small abscess arising off the pubic symphysis is also noted with subtle bone loss and cortical bone  destruction raising concern for septic arthritis and osteomyelitis of the pubic bone.  Left greater than right small pleural effusions with atelectasis.  Bilateral renal cysts.  Patient continues to have leukocytosis in fact his leukocytosis worsened 23.8 with fever of 101.4.  Will repeat CT scan of the abdomen and pelvis with upper part of the both thighs.  His left thigh is swollen tender and erythematous.  CT scan repeated 01/26/2018 abdomen pelvis and upper thighs shows-2.9 x 2.4 x 15.4 cm right rectus sheath abscess containing a pigtail drainage catheter. 2. Multiple fluid and gas collections in the proximal left thigh, not included in their entirety. Some of the included portions are slightly larger and some are slightly smaller and these have some peripheral rim enhancement, compatible with multiple abscesses. 3. Persistent findings compatible with abscess formation within and surrounding the symphysis pubis with mild changes of probable osteomyelitis. 4. Mild diffuse rectal wall thickening, most likely due to proctitis. 5. Small to moderate-sized left pleural effusion and small right pleural effusion. 6. Bilateral lower lobe compressive atelectasis. 7. Dense coronary artery atheromatous calcifications. 8. Moderate to large-sized bilateral scrotal hydroceles.  WILL RECONSULT IR for drainage of the abscess and since patient continues to have worsening leukocytosis.  #2 uncontrolled type 2 diabetes upon presentation patient was in DKA which was treated with protocol. Hemoglobin A1c 9.3.  Blood sugar running low due to decreased p.o. intake decrease the dose of insulin.  #3 hypothyroidism continue Synthroid  #4 history of prostate cancer and chronic Foley catheter due to change it sometime in December 2019.  #5 hyperlipidemia continue statin.     Estimated body mass index is 24.44 kg/m as calculated from the following:   Height as of this encounter: 5\' 8"  (1.727 m).  Weight as of this encounter: 72.9 kg.  DVT prophylaxis: Lovenox Code Status: Full code Family Communication: None Disposition Plan: Pending clinical improvement   Consultants: Infectious disease and IR   Procedures: Drain placement to the right rectus sheath for abscess Antimicrobials:  Subjective: Eager to go home complains of pain in the lower abdomen and upper thighs  Objective: Vitals:   01/26/18 0514 01/26/18 2007 01/27/18 0542 01/27/18 1347  BP: 137/73 129/78 122/70 (!) 145/82  Pulse: 95 (!) 126 (!) 108 (!) 104  Resp: 18 20 19 20   Temp: 98.2 F (36.8 C) 99.2 F (37.3 C) 98.9 F (37.2 C) 98.7 F (37.1 C)  TempSrc: Oral Oral Oral Oral  SpO2: 97% 97% 99% 98%  Weight:      Height:        Intake/Output Summary (Last 24 hours) at 01/27/2018 1403 Last data filed at 01/27/2018 0904 Gross per 24 hour  Intake 1455 ml  Output 860 ml  Net 595 ml   Filed Weights   01/17/18 0209 01/17/18 0600 01/23/18 2337  Weight: 70.3 kg 67.1 kg 72.9 kg    Examination:  General exam: Appears calm and comfortable  Respiratory system: Clear to auscultation. Respiratory effort normal. Cardiovascular system: S1 & S2 heard, RRR. No JVD, murmurs, rubs, gallops or clicks. No pedal edema. Gastrointestinal system: Abdomen is nondistended, soft and nontender. No organomegaly or masses felt. Normal bowel sounds heard. Central nervous system: Alert and oriented. No focal neurological deficits. Extremities scrotal edema right anterior upper thigh edematous erythematous and tender to touch Skin: No rashes, lesions or ulcers Psychiatry: Judgement and insight appear normal. Mood & affect appropriate.     Data Reviewed: I have personally reviewed following labs and imaging studies  CBC: Recent Labs  Lab 01/22/18 0548 01/23/18 0520 01/25/18 0348 01/26/18 0322 01/27/18 0228  WBC 16.8* 20.2* 23.2* 23.8* 28.4*  NEUTROABS  --   --  19.6*  --   --   HGB 8.5* 8.0* 7.9* 8.7* 8.3*  HCT 28.0*  26.0* 25.7* 29.4* 28.0*  MCV 80.0 78.8* 77.6* 80.3 79.3*  PLT 573* 613* 678* 762* 166*   Basic Metabolic Panel: Recent Labs  Lab 01/22/18 0548 01/23/18 0520 01/25/18 0348 01/26/18 0322 01/27/18 0228  NA 133* 126* 130* 134* 129*  K 3.4* 3.2* 2.7* 3.9 3.5  CL 99 92* 94* 97* 94*  CO2 26 24 24 24 25   GLUCOSE 78 250* 186* 140* 180*  BUN 15 14 12 11 10   CREATININE 0.77 0.70 0.96 0.79 0.79  CALCIUM 7.9* 7.2* 7.4* 7.7* 7.2*   GFR: Estimated Creatinine Clearance: 68.9 mL/min (by C-G formula based on SCr of 0.79 mg/dL). Liver Function Tests: No results for input(s): AST, ALT, ALKPHOS, BILITOT, PROT, ALBUMIN in the last 168 hours. No results for input(s): LIPASE, AMYLASE in the last 168 hours. No results for input(s): AMMONIA in the last 168 hours. Coagulation Profile: Recent Labs  Lab 01/25/18 0348  INR 1.15   Cardiac Enzymes: No results for input(s): CKTOTAL, CKMB, CKMBINDEX, TROPONINI in the last 168 hours. BNP (last 3 results) No results for input(s): PROBNP in the last 8760 hours. HbA1C: No results for input(s): HGBA1C in the last 72 hours. CBG: Recent Labs  Lab 01/26/18 1716 01/26/18 2111 01/27/18 0811 01/27/18 0902 01/27/18 1212  GLUCAP 246* 162* 61* 114* 193*   Lipid Profile: No results for input(s): CHOL, HDL, LDLCALC, TRIG, CHOLHDL, LDLDIRECT in the last 72 hours. Thyroid Function Tests: Recent Labs    01/25/18 0349  TSH 3.912   Anemia Panel: No results for input(s): VITAMINB12, FOLATE, FERRITIN, TIBC, IRON, RETICCTPCT in the last 72 hours. Sepsis Labs: No results for input(s): PROCALCITON, LATICACIDVEN in the last 168 hours.  Recent Results (from the past 240 hour(s))  Culture, Urine     Status: None   Collection Time: 01/19/18 11:04 AM  Result Value Ref Range Status   Specimen Description   Final    URINE, CLEAN CATCH Performed at [facility], [address], [place], [place] [address]    Special Requests   Final    NONE Performed at [facility], [address] [place], [place] [address]    Culture   Final    Multiple bacterial morphotypes present, none predominant. Suggest appropriate recollection if clinically indicated.   Report Status 01/20/2018 FINAL  Final  Culture, blood (Routine X 2) w Reflex to ID Panel     Status: None   Collection Time: 01/19/18 11:26 AM  Result Value Ref Range Status   Specimen Description RIGHT ANTECUBITAL  Final   Special Requests   Final    BOTTLES DRAWN AEROBIC AND ANAEROBIC Blood Culture adequate volume   Culture   Final    NO GROWTH 5 DAYS Performed at [facility], [address], [place], [place] [address]    Report Status 01/24/2018 FINAL  Final  Culture, blood (Routine X 2) w Reflex to ID Panel     Status: None   Collection Time: 01/19/18 11:37 AM  Result Value Ref Range Status   Specimen Description BLOOD LEFT HAND  Final   Special Requests   Final    BOTTLES DRAWN AEROBIC AND ANAEROBIC Blood Culture adequate volume   Culture   Final    NO GROWTH 5 DAYS Performed at [facility], [address], [place], [place] [address]    Report Status 01/24/2018 FINAL  Final  Aerobic/Anaerobic Culture (surgical/deep wound)     Status: None (Preliminary result)   Collection Time: 01/25/18 11:19 AM  Result Value Ref Range Status   Specimen Description ABSCESS  Final   Special Requests Normal  Final   Gram Stain   Final    ABUNDANT WBC PRESENT, PREDOMINANTLY PMN MODERATE GRAM POSITIVE RODS MODERATE GRAM POSITIVE COCCI FEW GRAM NEGATIVE RODS    Culture   Final    MODERATE ENTEROCOCCUS FAECALIS HOLDING FOR POSSIBLE ANAEROBE Performed at [facility], [place] [address] [place],  [address]    Report Status PENDING  Incomplete   Organism ID, Bacteria ENTEROCOCCUS FAECALIS  Final      Susceptibility   Enterococcus faecalis - MIC*    AMPICILLIN <=2 SENSITIVE Sensitive     VANCOMYCIN 1 SENSITIVE Sensitive     GENTAMICIN SYNERGY RESISTANT Resistant     * MODERATE ENTEROCOCCUS  FAECALIS         Radiology Studies: Ct Abdomen Pelvis W Contrast  Result Date: 01/27/2018 CLINICAL DATA:  Acute, generalized abdominal pain with fever and stool incontinence. Clinical concern for possible upper thigh abscess. EXAM: CT ABDOMEN AND PELVIS WITH CONTRAST TECHNIQUE: Multidetector CT imaging of the abdomen and pelvis was performed using the standard protocol following bolus administration of intravenous contrast. CONTRAST:  165mL OMNIPAQUE IOHEXOL 300 MG/ML  SOLN COMPARISON:  01/25/2018 CT-guided drainage and 01/22/2018 abdomen and pelvis CT. FINDINGS: Lower chest: Small to moderate-sized left pleural effusion and small right pleural effusion. Bilateral lower lobe compressive atelectasis. Dense coronary artery calcifications. Borderline enlarged heart. Hepatobiliary: Small liver cyst. Unremarkable gallbladder. Pancreas:  Diffusely atrophied. Spleen: Unremarkable. Adrenals/Urinary Tract: Normal appearing adrenal glands. Bilateral renal cysts. Foley catheter in the urinary bladder. Unremarkable ureters. Stomach/Bowel: Gas distended right, transverse and left colon containing stool and oral contrast. Nondistended mid and distal sigmoid colon. Mild diffuse rectal wall thickening. Vascular/Lymphatic: Atheromatous arterial calcifications without aneurysm. No enlarged lymph nodes. Reproductive: Multiple prostate radiation seed implants. Moderate to large-sized bilateral scrotal hydroceles. Other: Pigtail catheter within the previously demonstrated right rectus sheath abscess. There is some fluid and gas within the rectus sheath at and above that location, measuring 2.9 x 2.4 cm on image number 77 series 3 with mild peripheral wall enhancement. This measures approximately 15.4 cm in length. Again demonstrated are multiple fluid and gas collections in the proximal left thigh, not included in their entirety. Some of the included portions are slightly larger and some are slightly smaller and these have some  peripheral rim enhancement. Again demonstrated is fluid and gas within and surrounding the symphysis pubis with mild focal bone destruction and adjacent bony fragmentation. Diffuse subcutaneous edema. Musculoskeletal: Severe right hip degenerative changes with subarticular cyst formation. Moderate left hip degenerative changes. Lumbar and lower thoracic spine degenerative changes and mild scoliosis. IMPRESSION: 1. 2.9 x 2.4 x 15.4 cm right rectus sheath abscess containing a pigtail drainage catheter. 2. Multiple fluid and gas collections in the proximal left thigh, not included in their entirety. Some of the included portions are slightly larger and some are slightly smaller and these have some peripheral rim enhancement, compatible with multiple abscesses. 3. Persistent findings compatible with abscess formation within and surrounding the symphysis pubis with mild changes of probable osteomyelitis. 4. Mild diffuse rectal wall thickening, most likely due to proctitis. 5. Small to moderate-sized left pleural effusion and small right pleural effusion. 6. Bilateral lower lobe compressive atelectasis. 7. Dense coronary artery atheromatous calcifications. 8. Moderate to large-sized bilateral scrotal hydroceles. Electronically Signed   By: Claudie Revering M.D.   On: 01/27/2018 00:10        Scheduled Meds: . aspirin EC  81 mg Oral Daily  . feeding supplement (GLUCERNA SHAKE)  237 mL Oral BID BM  . insulin aspart  0-9 Units Subcutaneous TID WC  . insulin detemir  17 Units Subcutaneous QHS  . levothyroxine  25 mcg Oral Q0600  . pravastatin  10 mg Oral q1800  . sodium chloride flush  5 mL Intracatheter Q8H   Continuous Infusions: . sodium chloride Stopped (01/25/18 0105)  . ampicillin-sulbactam (UNASYN) IV 3 g (01/27/18 1000)     LOS: 10 days     Georgette Shell, MD [facility] If 7PM-7AM, please contact night-coverage [email or web address] Password [email or web address] 01/27/2018, 2:03 PM

## 2018-01-27 NOTE — Progress Notes (Signed)
[facility]  Date of Admission:  01/17/2018     Total days of antibiotics 12         ASSESSMENT/PLAN  Anthony [place] is on Day 12 of antimicrobial therapy (Day 2 of ampicillin-sulbactam) for likely enterococcus faecalis osteomyelitis of the pubic symphysis with abscesses in the rectus sheath and left thigh. Sensitivities remain pending. Small amount of purulent drainage from the pigtail drain. He has been afebrile in the last 24 hours with continued increase in leukocytosis now at 28.4. This could be related to the secondary abscess in the left thigh.  1. Continue ampicillin-sulbactam and treat for osteomyelitis.  2. Monitor cultures for sensitivity, fever curve and WBC count.  3. If WBC count continues to elevate or fevers return may need additional drain of left thigh abscess if feasible..   Principal Problem:   Sepsis secondary to UTI [facility]) Active Problems:   Hypothyroidism   DKA (diabetic ketoacidoses) ([name])   Normocytic anemia   Sepsis due to Bacteroides species ([name])   Sepsis without acute organ dysfunction ([place])   Psoas abscess ([place])   . aspirin EC  81 mg Oral Daily  . feeding supplement (GLUCERNA SHAKE)  237 mL Oral BID BM  . insulin aspart  0-9 Units Subcutaneous TID WC  . insulin detemir  17 Units Subcutaneous QHS  . levothyroxine  25 mcg Oral Q0600  . pravastatin  10 mg Oral q1800  . sodium chloride flush  5 mL Intracatheter Q8H    SUBJECTIVE:  Afebrile for the last 24 hours with increasing leukocytosis. CT scan with abscess measuring 2.9 x 2.4 x 15.4 in the rectus sheath with pigtail drain. Also noted multiple fluid and gas collection in proximal left thigh with persistent finding compatible with abscess formation within and surrounding the symphysis pubis and changes of probable osteomyelitis. Gram stain from abscess drainage with gram positive rods, gram positive cocci, and gram negative rods. Cultures with enterococcus faecalis.   No  Known Allergies   Review of Systems: Review of Systems  Constitutional: Negative for chills and fever.  Respiratory: Negative for cough, sputum production, shortness of breath and wheezing.   Cardiovascular: Negative for chest pain and leg swelling.  Gastrointestinal: Negative for abdominal pain, constipation, diarrhea, nausea and vomiting.  Genitourinary: Negative for dysuria, flank pain, frequency, hematuria and urgency.  Skin: Negative for rash.      OBJECTIVE: Vitals:   01/25/18 2258 01/26/18 0514 01/26/18 2007 01/27/18 0542  BP:  137/73 129/78 122/70  Pulse:  95 (!) 126 (!) 108  Resp:  18 20 19   Temp: 98.9 F (37.2 C) 98.2 F (36.8 C) 99.2 F (37.3 C) 98.9 F (37.2 C)  TempSrc: Oral Oral Oral Oral  SpO2:  97% 97% 99%  Weight:      Height:       Body mass index is 24.44 kg/m.  Physical Exam  Constitutional: He is oriented to person, place, and time. He appears well-developed and well-nourished. No distress.  Cardiovascular: Normal rate, regular rhythm, normal heart sounds and intact distal pulses.  Pulmonary/Chest: Effort normal and breath sounds normal.  Abdominal: Soft. Bowel sounds are normal. He exhibits no ascites. There is no hepatosplenomegaly. There is tenderness in the right lower quadrant. There is no rigidity, no rebound, no guarding, no tenderness at McBurney's point and negative Murphy's sign.  JP drain present with small amount cream colored purulent drainage. No induration or masses.   Neurological: He is alert and oriented to  person, place, and time.  Skin: Skin is warm and dry.  Psychiatric: He has a normal mood and affect. His behavior is normal. Judgment and thought content normal.    Lab Results Lab Results  Component Value Date   WBC 28.4 (H) 01/27/2018   HGB 8.3 (L) 01/27/2018   HCT 28.0 (L) 01/27/2018   MCV 79.3 (L) 01/27/2018   PLT 884 (H) 01/27/2018    Lab Results  Component Value Date   CREATININE 0.79 01/27/2018   BUN 10  01/27/2018   NA 129 (L) 01/27/2018   K 3.5 01/27/2018   CL 94 (L) 01/27/2018   CO2 25 01/27/2018    Lab Results  Component Value Date   ALT 15 01/17/2018   AST 19 01/17/2018   ALKPHOS 152 (H) 01/17/2018   BILITOT 0.7 01/17/2018     Microbiology: Recent Results (from the past 240 hour(s))  Culture, Urine     Status: None   Collection Time: 01/19/18 11:04 AM  Result Value Ref Range Status   Specimen Description   Final    URINE, CLEAN CATCH Performed at [facility], [address] [place], [place] [address]    Special Requests   Final    NONE Performed at [facility], [address], [place], [place] [address]    Culture   Final    Multiple bacterial morphotypes present, none predominant. Suggest appropriate recollection if clinically indicated.   Report Status 01/20/2018 FINAL  Final  Culture, blood (Routine X 2) w Reflex to ID Panel     Status: None   Collection Time: 01/19/18 11:26 AM  Result Value Ref Range Status   Specimen Description RIGHT ANTECUBITAL  Final   Special Requests   Final    BOTTLES DRAWN AEROBIC AND ANAEROBIC Blood Culture adequate volume   Culture   Final    NO GROWTH 5 DAYS Performed at [facility], [address], [place], [place] [address]    Report Status 01/24/2018 FINAL  Final  Culture, blood (Routine X 2) w Reflex to ID Panel     Status: None   Collection Time: 01/19/18 11:37 AM  Result Value Ref Range Status   Specimen Description BLOOD LEFT HAND  Final   Special Requests   Final    BOTTLES DRAWN AEROBIC AND ANAEROBIC Blood Culture adequate volume   Culture   Final    NO GROWTH 5 DAYS Performed at [facility], [address] [place], [place] [address]    Report Status 01/24/2018 FINAL  Final  Aerobic/Anaerobic Culture (surgical/deep wound)     Status: None (Preliminary result)   Collection Time: 01/25/18 11:19 AM  Result Value Ref Range Status   Specimen Description ABSCESS  Final   Special Requests Normal  Final   Gram Stain    Final    ABUNDANT WBC PRESENT, PREDOMINANTLY PMN MODERATE GRAM POSITIVE RODS MODERATE GRAM POSITIVE COCCI FEW GRAM NEGATIVE RODS Performed at [facility], [place] [address] [place], [place] [address]    Culture MODERATE ENTEROCOCCUS FAECALIS  Final   Report Status PENDING  Incomplete     Terri Piedra, NP [place] [facility] [phone] Pager  01/27/2018  9:29 AM

## 2018-01-28 ENCOUNTER — Encounter (HOSPITAL_COMMUNITY)
Admission: EM | Disposition: A | Discharge status: Skilled Nursing Facility | Payer: Self-pay | Source: Home / Self Care | Attending: Internal Medicine

## 2018-01-28 ENCOUNTER — Inpatient Hospital Stay (HOSPITAL_COMMUNITY): Payer: Medicare Other

## 2018-01-28 LAB — CBC
HCT: 26.3 % — ABNORMAL LOW (ref 39.0–52.0)
Hemoglobin: 8.1 g/dL — ABNORMAL LOW (ref 13.0–17.0)
MCH: 24.3 pg — ABNORMAL LOW (ref 26.0–34.0)
MCHC: 30.8 g/dL (ref 30.0–36.0)
MCV: 78.7 fL — ABNORMAL LOW (ref 80.0–100.0)
Platelets: 796 10*3/uL — ABNORMAL HIGH (ref 150–400)
RBC: 3.34 MIL/uL — ABNORMAL LOW (ref 4.22–5.81)
RDW: 14.7 % (ref 11.5–15.5)
WBC: 26.5 10*3/uL — ABNORMAL HIGH (ref 4.0–10.5)
nRBC: 0 % (ref 0.0–0.2)

## 2018-01-28 LAB — BASIC METABOLIC PANEL
Anion gap: 11 (ref 5–15)
BUN: 11 mg/dL (ref 8–23)
CO2: 25 mmol/L (ref 22–32)
Calcium: 7.3 mg/dL — ABNORMAL LOW (ref 8.9–10.3)
Chloride: 93 mmol/L — ABNORMAL LOW (ref 98–111)
Creatinine, Ser: 0.82 mg/dL (ref 0.61–1.24)
Glucose, Bld: 301 mg/dL — ABNORMAL HIGH (ref 70–99)
Potassium: 3.7 mmol/L (ref 3.5–5.1)
SODIUM: 129 mmol/L — AB (ref 135–145)

## 2018-01-28 LAB — SURGICAL PCR SCREEN
MRSA, PCR: POSITIVE — AB
Staphylococcus aureus: POSITIVE — AB

## 2018-01-28 LAB — GLUCOSE, CAPILLARY
Glucose-Capillary: 281 mg/dL — ABNORMAL HIGH (ref 70–99)
Glucose-Capillary: 268 mg/dL — ABNORMAL HIGH (ref 70–99)
Glucose-Capillary: 163 mg/dL — ABNORMAL HIGH (ref 70–99)
Glucose-Capillary: 167 mg/dL — ABNORMAL HIGH (ref 70–99)
Glucose-Capillary: 243 mg/dL — ABNORMAL HIGH (ref 70–99)
Glucose-Capillary: 271 mg/dL — ABNORMAL HIGH (ref 70–99)

## 2018-01-28 SURGERY — INCISION AND DRAINAGE, ABSCESS
Anesthesia: Choice | Laterality: Left

## 2018-01-28 MED ORDER — CHLORHEXIDINE GLUCONATE CLOTH 2 % EX PADS
6.0000 | MEDICATED_PAD | Freq: Every day | CUTANEOUS | Status: AC
  Administered 2018-01-28 – 2018-02-01 (×5): 6 via TOPICAL

## 2018-01-28 MED ORDER — MUPIROCIN 2 % EX OINT
1.0000 "application " | TOPICAL_OINTMENT | Freq: Two times a day (BID) | CUTANEOUS | Status: AC
  Administered 2018-01-28 – 2018-02-01 (×10): 1 via NASAL

## 2018-01-28 MED ORDER — POVIDONE-IODINE 10 % EX SWAB: 2.0000 "application " | Freq: Once | CUTANEOUS | Status: DC

## 2018-01-28 MED ORDER — GADOBUTROL 1 MMOL/ML IV SOLN
7.0000 mL | Freq: Once | INTRAVENOUS | Status: AC | PRN
  Administered 2018-01-28: 7 mL via INTRAVENOUS

## 2018-01-28 MED ORDER — INSULIN DETEMIR 100 UNIT/ML ~~LOC~~ SOLN
16.0000 [IU] | Freq: Every day | SUBCUTANEOUS | Status: DC
  Administered 2018-01-28: 16 [IU] via SUBCUTANEOUS
  Filled 2018-01-28 (×2): qty 0.16

## 2018-01-28 MED ORDER — INSULIN ASPART 100 UNIT/ML ~~LOC~~ SOLN
3.0000 [IU] | Freq: Once | SUBCUTANEOUS | Status: AC
  Administered 2018-01-28: 3 [IU] via SUBCUTANEOUS

## 2018-01-28 MED ORDER — CHLORHEXIDINE GLUCONATE 4 % EX LIQD
60.0000 mL | Freq: Once | CUTANEOUS | Status: AC
  Administered 2018-01-28: 4 via TOPICAL
  Filled 2018-01-28: qty 15

## 2018-01-28 NOTE — Progress Notes (Signed)
PROGRESS NOTE    Anthony Dunlap  RXV:[id number] DOB: [birth year] DOA: 01/17/2018 PCP: [name], MD  Brief Narrative:  84 y.o.malewith medical history significant forhypothyroidism, insulin-dependent diabetes mellitus, hyperlipidemia, and history of prostate cancer with chronic Foley catheter, now presenting to the emergency department for evaluation of rigors. Patient reports that he was in his usual state of health until overnight when he began sweating and developed shaking chills. In the ED, pt was febrile with HR in 140s. He also had serum glucose of 440 with anion gap of 12. He was initially started on IVF and IV insulin. He denies any recent cough, shortness of breath, chest pain, vomiting, or diarrhea. Patient was admitted for sepsis due topresumptive UTI initially. Urine culture was not revealing. Blood cultures grew peptostreptococcus and Bacteroides thetaiodomicron. Antibiotics were adjusted   Assessment & Plan:   Principal Problem:   Enterococcus faecalis infection Active Problems:   Sepsis secondary to UTI ([place])   Hypothyroidism   DKA (diabetic ketoacidoses) ([name])   Normocytic anemia   Sepsis due to Bacteroides species ([name])   Sepsis without acute organ dysfunction ([name])   Psoas abscess ([name])   Abscess  #1 sepsis secondary to bacteremia with Peptostreptococcus and bacteroids.CT scan of the abdomen and pelvis showing multiple intramuscular abscesses. 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 Small abscess arising off the pubic symphysis is also noted with subtle bone loss and  cortical bone destruction raising concern for septic arthritis and osteomyelitis of the pubic bone. Left greater than right small pleural effusions with atelectasis. Bilateral renal cysts. Leukocytosis stable afebrile this morning discussed with Dr. Rigoberto Noel with Ortho MRI ordered.  Will await results of MRI.  May need to reconsult IR depending on the findings.  Left thigh continues to be swollen and red. CT scan repeated 01/26/2018 abdomen pelvis and upper thighs shows-2.9 x 2.4 x 15.4 cm right rectus sheath abscess containing a pigtail drainage catheter.. Multiple fluid and gas collections in the proximal left thigh, not included in their entirety. Some of the included portions are slightly larger and some are slightly smaller and these have some peripheral rim enhancement, compatible with multiple abscesses.. Persistent findings compatible with abscess formation within and surrounding the symphysis pubis with mild changes of probable osteomyelitis. Mild diffuse rectal wall thickening, most likely due to proctitis.   Small to moderate-sized left pleural effusion and small right pleural effusion. Bilateral lower lobe compressive atelectasis. Dense coronary artery atheromatous calcifications.  Moderate to large-sized bilateral scrotal hydroceles.   #2 uncontrolled type 2 diabetes increase the dose of Lantus.  #3 hypothyroidism continue Synthroid  #4 history of prostate cancer and chronic Foley catheter due to change it sometime in December 2019.  #5 hyperlipidemia continue statin.       Estimated body mass index is 24.44 kg/m as calculated from the following:   Height as of this encounter: 5\' 8"  (1.727 m).   Weight as of this encounter: 72.9 kg.  DVT prophylaxis lovenox Code Status: full Family Communication:none Disposition Plan: Pending clinical improvement   Consultants: Ortho, infectious disease   Procedures: Right rectus sheath drain antimicrobials:  Unasyn  Subjective: Resting in bed very weak complains of pain in the  pelvis abdominal area and upper thighs  Objective: Vitals:   01/27/18 0542 01/27/18 1347 01/27/18 2129 01/28/18 0548  BP: 122/70 (!) 145/82 131/68 138/77  Pulse: (!) 108 (!) 104 (!) 108 (!) 105  Resp: 19 20 18 18   Temp: 98.9 F (37.2 C) 98.7 F (37.1 C) 98.8 F (37.1 C) 98.4 F (36.9 C)  TempSrc: Oral Oral Oral Oral  SpO2: 99% 98% 97% 97%  Weight:      Height:        Intake/Output Summary (Last 24 hours) at 01/28/2018 1402 Last data filed at 01/28/2018 1341 Gross per 24 hour  Intake 800 ml  Output 1260 ml  Net -460 ml   Filed Weights   01/17/18 0209 01/17/18 0600 01/23/18 2337  Weight: 70.3 kg 67.1 kg 72.9 kg    Examination:  General exam: Appears calm and comfortable  Respiratory system: Clear to auscultation. Respiratory effort normal. Cardiovascular system: S1 & S2 heard, RRR. No JVD, murmurs, rubs, gallops or clicks. No pedal edema. Gastrointestinal system: Abdomen is nondistended, soft and nontender. No organomegaly or masses felt. Normal bowel sounds heard. Central nervous system: Alert and oriented. No focal neurological deficits. Extremities: Symmetric 5 x 5 power. Skin: No rashes, lesions or ulcers Psychiatry: Judgement and insight appear normal. Mood & affect appropriate.     Data Reviewed: I have personally reviewed following labs and imaging studies  CBC: Recent Labs  Lab 01/23/18 0520 01/25/18 0348 01/26/18 0322 01/27/18 0228 01/28/18 0159  WBC 20.2* 23.2* 23.8* 28.4* 26.5*  NEUTROABS  --  19.6*  --   --   --   HGB 8.0* 7.9* 8.7* 8.3* 8.1*  HCT 26.0* 25.7* 29.4* 28.0* 26.3*  MCV 78.8* 77.6* 80.3 79.3* 78.7*  PLT 613* 678* 762* 884* 174*   Basic Metabolic Panel: Recent Labs  Lab 01/23/18 0520 01/25/18 0348 01/26/18 0322 01/27/18 0228 01/28/18 0159  NA 126* 130* 134* 129* 129*  K 3.2* 2.7* 3.9 3.5 3.7  CL 92* 94* 97* 94* 93*  CO2 24 24 24 25 25   GLUCOSE 250* 186*  140* 180* 301*  BUN 14 12 11 10 11   CREATININE 0.70 0.96 0.79 0.79 0.82  CALCIUM 7.2* 7.4* 7.7* 7.2* 7.3*   GFR: Estimated Creatinine Clearance: 67.2 mL/min (by C-G formula based on SCr of 0.82 mg/dL). Liver Function Tests: No results for input(s): AST, ALT, ALKPHOS, BILITOT, PROT, ALBUMIN in the last 168 hours. No results for input(s): LIPASE, AMYLASE in the last 168 hours. No results for input(s): AMMONIA in the last 168 hours. Coagulation Profile: Recent Labs  Lab 01/25/18 0348  INR 1.15   Cardiac Enzymes: No results for input(s): CKTOTAL, CKMB, CKMBINDEX, TROPONINI in the last 168 hours. BNP (last 3 results) No results for input(s): PROBNP in the last 8760 hours. HbA1C: No results for input(s): HGBA1C in the last 72 hours. CBG: Recent Labs  Lab 01/27/18 2125 01/28/18 0059 01/28/18 0251 01/28/18 0753 01/28/18 1232  GLUCAP 258* 281* 268* 163* 167*   Lipid Profile: No results for input(s): CHOL, HDL, LDLCALC, TRIG, CHOLHDL, LDLDIRECT in the last 72 hours. Thyroid Function Tests: No results for input(s): TSH, T4TOTAL, FREET4, T3FREE, THYROIDAB in the last 72 hours. Anemia Panel: No results for input(s): VITAMINB12, FOLATE, FERRITIN, TIBC, IRON, RETICCTPCT in the last 72 hours. Sepsis Labs: No results for input(s): PROCALCITON, LATICACIDVEN in the last 168 hours.  Recent Results (from the past 240 hour(s))  Culture, Urine     Status: None   Collection Time: 01/19/18 11:04  AM  Result Value Ref Range Status   Specimen Description   Final    URINE, CLEAN CATCH Performed at [facility], [address] [place], [place] [address]    Special Requests   Final    NONE Performed at [facility], [address], [place], [place] [address]    Culture   Final    Multiple bacterial morphotypes present, none predominant. Suggest appropriate recollection if clinically indicated.   Report Status 01/20/2018 FINAL  Final  Culture, blood (Routine X 2) w Reflex to ID Panel      Status: None   Collection Time: 01/19/18 11:26 AM  Result Value Ref Range Status   Specimen Description RIGHT ANTECUBITAL  Final   Special Requests   Final    BOTTLES DRAWN AEROBIC AND ANAEROBIC Blood Culture adequate volume   Culture   Final    NO GROWTH 5 DAYS Performed at [facility], [address], [place], [place] [address]    Report Status 01/24/2018 FINAL  Final  Culture, blood (Routine X 2) w Reflex to ID Panel     Status: None   Collection Time: 01/19/18 11:37 AM  Result Value Ref Range Status   Specimen Description BLOOD LEFT HAND  Final   Special Requests   Final    BOTTLES DRAWN AEROBIC AND ANAEROBIC Blood Culture adequate volume   Culture   Final    NO GROWTH 5 DAYS Performed at [facility], [address] [place], [place] [address]    Report Status 01/24/2018 FINAL  Final  Aerobic/Anaerobic Culture (surgical/deep wound)     Status: None (Preliminary result)   Collection Time: 01/25/18 11:19 AM  Result Value Ref Range Status   Specimen Description ABSCESS  Final   Special Requests Normal  Final   Gram Stain   Final    ABUNDANT WBC PRESENT, PREDOMINANTLY PMN MODERATE GRAM POSITIVE RODS MODERATE GRAM POSITIVE COCCI FEW GRAM NEGATIVE RODS    Culture   Final    MODERATE ENTEROCOCCUS FAECALIS MODERATE BACTEROIDES THETAIOTAOMICRON BETA LACTAMASE NEGATIVE Performed at [facility], [place] [address], [place],  [address]    Report Status PENDING  Incomplete   Organism ID, Bacteria ENTEROCOCCUS FAECALIS  Final      Susceptibility   Enterococcus faecalis - MIC*    AMPICILLIN <=2 SENSITIVE Sensitive     VANCOMYCIN 1 SENSITIVE Sensitive     GENTAMICIN SYNERGY RESISTANT Resistant     * MODERATE ENTEROCOCCUS FAECALIS  Surgical pcr screen     Status: Abnormal   Collection Time: 01/28/18  1:15 AM  Result Value Ref Range Status   MRSA, PCR POSITIVE (A) NEGATIVE Final    Comment: RESULT CALLED TO, READ BACK BY AND VERIFIED WITH: HARGROVE,C RN 0308 01/28/18  MITCHELL,L    Staphylococcus aureus POSITIVE (A) NEGATIVE Final    Comment: (NOTE) The Xpert SA Assay (FDA approved for NASAL specimens in patients 71 years of age and older), is one component of a comprehensive surveillance program. It is not intended to diagnose infection nor to guide or monitor treatment.          Radiology Studies: Ct Abdomen Pelvis W Contrast  Result Date: 01/27/2018 CLINICAL DATA:  Acute, generalized abdominal pain with fever and stool incontinence. Clinical concern for possible upper thigh abscess. EXAM: CT ABDOMEN AND PELVIS WITH CONTRAST TECHNIQUE: Multidetector CT imaging of the abdomen and pelvis was performed using the standard protocol following bolus administration of intravenous contrast. CONTRAST:  154mL OMNIPAQUE IOHEXOL  300 MG/ML  SOLN COMPARISON:  01/25/2018 CT-guided drainage and 01/22/2018 abdomen and pelvis CT. FINDINGS: Lower chest: Small to moderate-sized left pleural effusion and small right pleural effusion. Bilateral lower lobe compressive atelectasis. Dense coronary artery calcifications. Borderline enlarged heart. Hepatobiliary: Small liver cyst. Unremarkable gallbladder. Pancreas: Diffusely atrophied. Spleen: Unremarkable. Adrenals/Urinary Tract: Normal appearing adrenal glands. Bilateral renal cysts. Foley catheter in the urinary bladder. Unremarkable ureters. Stomach/Bowel: Gas distended right, transverse and left colon containing stool and oral contrast. Nondistended mid and distal sigmoid colon. Mild diffuse rectal wall thickening. Vascular/Lymphatic: Atheromatous arterial calcifications without aneurysm. No enlarged lymph nodes. Reproductive: Multiple prostate radiation seed implants. Moderate to large-sized bilateral scrotal hydroceles. Other: Pigtail catheter within the previously demonstrated right rectus sheath abscess. There is some fluid and gas within the rectus sheath at and above that location, measuring 2.9 x 2.4 cm on image number 77  series 3 with mild peripheral wall enhancement. This measures approximately 15.4 cm in length. Again demonstrated are multiple fluid and gas collections in the proximal left thigh, not included in their entirety. Some of the included portions are slightly larger and some are slightly smaller and these have some peripheral rim enhancement. Again demonstrated is fluid and gas within and surrounding the symphysis pubis with mild focal bone destruction and adjacent bony fragmentation. Diffuse subcutaneous edema. Musculoskeletal: Severe right hip degenerative changes with subarticular cyst formation. Moderate left hip degenerative changes. Lumbar and lower thoracic spine degenerative changes and mild scoliosis. IMPRESSION: 1. 2.9 x 2.4 x 15.4 cm right rectus sheath abscess containing a pigtail drainage catheter. 2. Multiple fluid and gas collections in the proximal left thigh, not included in their entirety. Some of the included portions are slightly larger and some are slightly smaller and these have some peripheral rim enhancement, compatible with multiple abscesses. 3. Persistent findings compatible with abscess formation within and surrounding the symphysis pubis with mild changes of probable osteomyelitis. 4. Mild diffuse rectal wall thickening, most likely due to proctitis. 5. Small to moderate-sized left pleural effusion and small right pleural effusion. 6. Bilateral lower lobe compressive atelectasis. 7. Dense coronary artery atheromatous calcifications. 8. Moderate to large-sized bilateral scrotal hydroceles. Electronically Signed   By: Claudie Revering M.D.   On: 01/27/2018 00:10        Scheduled Meds: . aspirin EC  81 mg Oral Daily  . Chlorhexidine Gluconate Cloth  6 each Topical Q0600  . feeding supplement (GLUCERNA SHAKE)  237 mL Oral BID BM  . insulin aspart  0-9 Units Subcutaneous TID WC  . insulin detemir  12 Units Subcutaneous QHS  . levothyroxine  25 mcg Oral Q0600  . mupirocin ointment  1  application Nasal BID  . povidone-iodine  2 application Topical Once  . pravastatin  10 mg Oral q1800  . sodium chloride flush  5 mL Intracatheter Q8H   Continuous Infusions: . sodium chloride Stopped (01/25/18 0105)  . ampicillin-sulbactam (UNASYN) IV 3 g (01/28/18 1024)     LOS: 11 days     Georgette Shell, MD [facility] If 7PM-7AM, please contact night-coverage [email or web address] Password [email or web address] 01/28/2018, 2:02 PM

## 2018-01-28 NOTE — Evaluation (Signed)
Physical Therapy Evaluation Patient Details Name: Anthony Dunlap MRN: [id number] DOB: Aug 27, 1933 Today's Date: 01/28/2018   History of Present Illness  84 y.o. male with medical history significant for hypothyroidism, insulin-dependent diabetes mellitus, hyperlipidemia, and history of prostate cancer with chronic Foley catheter, now presenting to the emergency department for evaluation of rigors.  Patient reports that he was in his usual state of health until overnight when he began sweating and developed shaking chills.  In the ED, pt was febrile with HR in 140s.  He also had serum glucose of 440 with anion gap of 12.  Pt admitted with sepsis and CT revealed muliple intramuscular abscesses of the abdomen and pelvis.   Clinical Impression  Pt admitted with above diagnosis. Pt currently with functional limitations due to the deficits listed below (see PT Problem List). On last admission less than a month ago pt was ambulatory, on eval today, he was confused and needed max A for bed mobility and use of stedy for pivot to recliner from bed. Not safe to be home at this point, recommending SNF.  Pt will benefit from skilled PT to increase their independence and safety with mobility to allow discharge to the venue listed below.       Follow Up Recommendations SNF;Supervision/Assistance - 24 hour    Equipment Recommendations  None recommended by PT    Recommendations for Other Services OT consult     Precautions / Restrictions Precautions Precautions: Fall Restrictions Weight Bearing Restrictions: No      Mobility  Bed Mobility Overal bed mobility: Needs Assistance Bed Mobility: Supine to Sit     Supine to sit: Max assist     General bed mobility comments: tactile cues for initiation of bed mobility, max A for LE's off bed as he could not lift them and max A at trunk, posterior lean as he came to sitting  Transfers Overall transfer level: Needs assistance Equipment used: Rolling  walker (2 wheeled);Ambulation equipment used Transfers: Sit to/from [facility] Sit to Stand: Max assist Stand pivot transfers: Total assist       General transfer comment: pt attempted standing to RW but cuold not achieve full upright with max A for power up. Also attempted with therapist directly in front of pt but pt still could not fully extend knees or trunk. Stedy used and pt able to stand with max A to get seat down. Stood from stedy with mod A to sit in recliner  Ambulation/Gait             General Gait Details: unable  Stairs            Wheelchair Mobility    Modified Rankin (Stroke Patients Only)       Balance Overall balance assessment: Needs assistance Sitting-balance support: Bilateral upper extremity supported Sitting balance-Leahy Scale: Poor Sitting balance - Comments: posterior lean with initial sitting, min A needed to maintain sitting. Once at EOB, progressed to supervision and was able to get wt fwd Postural control: Posterior lean Standing balance support: Bilateral upper extremity supported Standing balance-Leahy Scale: Zero                               Pertinent Vitals/Pain Pain Assessment: No/denies pain    Home Living Family/patient expects to be discharged to:: Private residence Living Arrangements: Spouse/significant other Available Help at Discharge: Family Type of Home: House Home Access: Ramped entrance  Home Layout: One level Home Equipment: Walker - 2 wheels;Cane - single point;Bedside commode;Shower seat Additional Comments: pt's wife has Alzheimers, kids help out but unsure if they live with them    Prior Function Level of Independence: Independent         Comments: pt confused on eval but per last admission he was independent and driving     Hand Dominance        Extremity/Trunk Assessment   Upper Extremity Assessment Upper Extremity Assessment: Generalized weakness    Lower  Extremity Assessment Lower Extremity Assessment: Generalized weakness    Cervical / Trunk Assessment Cervical / Trunk Assessment: Normal  Communication   Communication: No difficulties  Cognition Arousal/Alertness: Awake/alert Behavior During Therapy: WFL for tasks assessed/performed Overall Cognitive Status: Impaired/Different from baseline Area of Impairment: Memory;Problem solving                     Memory: Decreased short-term memory       Problem Solving: Requires verbal cues;Difficulty sequencing General Comments: confused      General Comments General comments (skin integrity, edema, etc.): pt with constant oozing of clear fluid seemingly from rectum. RN aware    Exercises     Assessment/Plan    PT Assessment Patient needs continued PT services  PT Problem List Decreased strength;Decreased range of motion;Decreased activity tolerance;Decreased balance;Decreased mobility;Decreased coordination;Decreased cognition;Decreased knowledge of use of DME;Decreased safety awareness;Decreased knowledge of precautions       PT Treatment Interventions DME instruction;Gait training;Stair training;Functional mobility training;Therapeutic activities;Therapeutic exercise;Balance training;Neuromuscular re-education;Cognitive remediation;Patient/family education    PT Goals (Current goals can be found in the Care Plan section)  Acute Rehab PT Goals Patient Stated Goal: return home with family to assist PT Goal Formulation: With patient Time For Goal Achievement: 02/11/18 Potential to Achieve Goals: Fair    Frequency Min 2X/week   Barriers to discharge Decreased caregiver support wife with alzheimers, unsure other help available    Co-evaluation               AM-PAC PT "6 Clicks" Mobility  Outcome Measure Help needed turning from your back to your side while in a flat bed without using bedrails?: A Lot Help needed moving from lying on your back to sitting on  the side of a flat bed without using bedrails?: A Lot Help needed moving to and from a bed to a chair (including a wheelchair)?: Total Help needed standing up from a chair using your arms (e.g., wheelchair or bedside chair)?: Total Help needed to walk in hospital room?: Total Help needed climbing 3-5 steps with a railing? : Total 6 Click Score: 8    End of Session Equipment Utilized During Treatment: Gait belt Activity Tolerance: Patient limited by fatigue Patient left: in chair;with call bell/phone within reach Nurse Communication: Mobility status PT Visit Diagnosis: Unsteadiness on feet (R26.81);Other abnormalities of gait and mobility (R26.89);Muscle weakness (generalized) (M62.81)    Time: 4098-1191 PT Time Calculation (min) (ACUTE ONLY): 25 min   Charges:   PT Evaluation $PT Eval Moderate Complexity: 1 Mod PT Treatments $Therapeutic Activity: 8-22 mins        Leighton Roach, [place]  Pager [phone] Office [place] 01/28/2018, 1:35 PM

## 2018-01-28 NOTE — Plan of Care (Signed)

## 2018-01-28 NOTE — [place] FL2 (Signed)
[place] MEDICAID FL2 LEVEL OF CARE SCREENING TOOL     IDENTIFICATION  Patient Name: Anthony Dunlap Birthdate: Feb 26, 1933 Sex: male Admission Date (Current Location): 01/17/2018  [facility] and [place] Number:  Herbalist and Address:  [facility] [place], [place] [address] [place], [place] [address]      Provider Number: [id number]  Attending Physician Name and Address:  Georgette Shell, MD  Relative Name and Phone Number:  Kennie Snedden; wife; [phone]    Current Level of Care: Hospital Recommended Level of Care: [place] Prior Approval Number:    Date Approved/Denied:   PASRR Number: [id number]  Discharge Plan:      Current Diagnoses: Patient Active Problem List   Diagnosis Date Noted  . Enterococcus faecalis infection 01/27/2018  . Abscess   . 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 Sepsis due to Bacteroides species ([place]) 01/22/2018  . Sepsis without acute organ dysfunction ([place])   . DKA (diabetic ketoacidoses) ([place]) 01/17/2018  . Normocytic anemia 01/17/2018  . Sepsis secondary to UTI ([place]) 01/03/2018  . UTI (urinary tract infection) 01/03/2018  . Chronic anemia 01/03/2018  . Chronic indwelling Foley catheter 01/03/2018  . Elevated alkaline phosphatase level 01/03/2018  . Hypothyroidism 01/03/2018  . Physical deconditioning 01/03/2018  . 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 HLD (hyperlipidemia) 08/19/2013  . Type 2 diabetes mellitus ([place])   . Prostate cancer ([place])     Orientation RESPIRATION BLADDER Height & Weight     Self, Place  Normal Continent, Indwelling catheter(coude catheter) Weight: 160 lb 11.5 oz (72.9 kg) Height:  5\' 8"  (172.7 cm)  BEHAVIORAL SYMPTOMS/MOOD NEUROLOGICAL BOWEL NUTRITION STATUS      Continent Diet(see discharge summary)  AMBULATORY STATUS COMMUNICATION OF NEEDS Skin   Extensive Assist Verbally Other (Comment)(jp drain)                       Personal Care Assistance  Level of Assistance  Bathing, Feeding, Dressing Bathing Assistance: Maximum assistance Feeding assistance: Independent Dressing Assistance: Maximum assistance     Functional Limitations Info  Sight, Hearing, Speech Sight Info: Adequate Hearing Info: Adequate Speech Info: Adequate    SPECIAL CARE FACTORS FREQUENCY  PT (By licensed PT), OT (By licensed OT)     PT Frequency: 5x week OT Frequency: 5x week            Contractures Contractures Info: Not present    Additional Factors Info  Code Status, Allergies, Insulin Sliding Scale Code Status Info: Full Code Allergies Info: No Known Allergies   Insulin Sliding Scale Info: insulin aspart (novoLOG) injection 0-9 Units 3x daily with meals; insulin detemir (LEVEMIR) injection 16 Units daily at bedtime       Current Medications (01/28/2018):  This is the current hospital active medication list Current Facility-Administered Medications  Medication Dose Route Frequency Provider Last Rate Last Dose  . 0.9 %  sodium chloride infusion   Intravenous PRN Georgette Shell, MD   Stopped at 01/25/18 0105  . acetaminophen (TYLENOL) tablet 650 mg  650 mg Oral Q6H PRN Orson Eva, MD   650 mg at 01/25/18 2132  . Ampicillin-Sulbactam (UNASYN) 3 g in sodium chloride 0.9 % 100 mL IVPB  3 g Intravenous Q6H Mancheril, Darnell Level, RPH 200 mL/hr at 01/28/18 1024 3 g at 01/28/18 1024  . aspirin EC tablet 81 mg  81 mg Oral Daily Tat, Shanon Brow, MD   81 mg at 01/27/18  8270  . Chlorhexidine Gluconate Cloth 2 % PADS 6 each  6 each Topical Q0600 Georgette Shell, MD   6 each at 01/28/18 [phone]  . feeding supplement (GLUCERNA SHAKE) (GLUCERNA SHAKE) liquid 237 mL  237 mL Oral BID BM Orson Eva, MD   237 mL at 01/28/18 1341  . ibuprofen (ADVIL,MOTRIN) tablet 400 mg  400 mg Oral Q12H PRN Tat, Graysyn, MD      . insulin aspart (novoLOG) injection 0-9 Units  0-9 Units Subcutaneous TID WC Orson Eva, MD   2 Units at 01/28/18 1237  . insulin detemir (LEVEMIR)  injection 16 Units  16 Units Subcutaneous QHS Georgette Shell, MD      . levothyroxine (SYNTHROID, LEVOTHROID) tablet 25 mcg  25 mcg Oral Q0600 Orson Eva, MD   25 mcg at 01/28/18 0549  . mupirocin ointment (BACTROBAN) 2 % 1 application  1 application Nasal BID Georgette Shell, MD   1 application at 54/49/20 1025  . ondansetron (ZOFRAN) injection 4 mg  4 mg Intravenous Q6H PRN Tat, Shanon Brow, MD   4 mg at 01/18/18 1014  . povidone-iodine 10 % swab 2 application  2 application Topical Once Paralee Cancel, MD      . pravastatin (PRAVACHOL) tablet 10 mg  10 mg Oral [id number] Orson Eva, MD   10 mg at 01/27/18 1725  . sodium chloride flush (NS) 0.9 % injection 5 mL  5 mL Intracatheter Q8H Aletta Edouard, MD   5 mL at 01/28/18 1341     Discharge Medications: Please see discharge summary for a list of discharge medications.  Relevant Imaging Results:  Relevant Lab Results:   Additional Information SS#[id number] [place], [place]

## 2018-01-28 NOTE — Progress Notes (Signed)
Dr. Erlinda Hong of [facility] has previously been consulted on this patient and will continue care per his discretion.

## 2018-01-28 NOTE — Progress Notes (Signed)
Inpatient Diabetes Program Recommendations  AACE/ADA: New Consensus Statement on Inpatient Glycemic Control (2019)  Target Ranges:  Prepandial:   less than 140 mg/dL      Peak postprandial:   less than 180 mg/dL (1-2 hours)      Critically ill patients:  140 - 180 mg/dL   Results for TOMMY, GOOSTREE (MRN [id number]) as of 01/28/2018 10:50  Ref. Range 01/27/2018 08:11 01/27/2018 09:02 01/27/2018 12:12 01/27/2018 17:17 01/27/2018 21:25 01/28/2018 00:59 01/28/2018 02:51 01/28/2018 07:53  Glucose-Capillary Latest Ref Range: 70 - 99 mg/dL 61 (L) 114 (H) 193 (H)  Novolog 2 units 198 (H)  Novolog 2 units 258 (H)  Lantus 12 units 281 (H) 268 (H)  Novolog 3 units 163 (H)   Review of Glycemic Control  Current orders for Inpatient glycemic control: Levemir 12 units QHS, Novolog 0-9 units TID with meals  Inpatient Diabetes Program Recommendations:   Insulin - Basal: Please consider slightly increasing Levemir to 14 units QHS.  Insulin-Correction: Please consider ordering Novolog 0-5 units QHS for bedtime correction scale.  Insulin-Meal Coverage: Once diet resumed, please consider ordering Novolog 3 units TID with meals for meal coverage if patient eats at least 50% of meals.  Thanks, [name], RN, MSN, CDE Diabetes Coordinator Inpatient Diabetes Program [phone] (Team Pager from 8am to 5pm)

## 2018-01-28 NOTE — Progress Notes (Signed)
[facility] for Infectious Disease  Date of Admission:  01/17/2018     Total days of antibiotics 13         ASSESSMENT/PLAN  Mr. 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 IR unable to place more drains and with increasing WBC count orthopedics with plan for OR this evening. Enterococcus sensitive to ampicillin and will continue with current ampicillin-sulbactam.   1. Continue ampicillin-sulbactam.  2. Monitor fevers and WBC count. 3. OR today per orthopedics.    Principal Problem:   Enterococcus faecalis infection Active Problems:   Psoas abscess ([name])   Sepsis secondary to UTI ([place])   Hypothyroidism   DKA (diabetic ketoacidoses) ([name])   Normocytic anemia   Sepsis due to Bacteroides species ([name])   Sepsis without acute organ dysfunction ([name])   Abscess   . aspirin EC  81 mg Oral Daily  . Chlorhexidine Gluconate Cloth  6 each Topical Q0600  . feeding supplement (GLUCERNA SHAKE)  237 mL Oral BID BM  . insulin aspart  0-9 Units Subcutaneous TID WC  . insulin detemir  12 Units Subcutaneous QHS  . levothyroxine  25 mcg Oral Q0600  . mupirocin ointment  1 application Nasal BID  . povidone-iodine  2 application Topical Once  . pravastatin  10 mg Oral q1800  . sodium chloride flush  5 mL Intracatheter Q8H    SUBJECTIVE:  Afebrile overnight with stable WBC count. No problems overnight. Planned to go to the OR today for I&D.   No Known Allergies   Review of Systems: Review of Systems  Constitutional: Negative for chills, fever and malaise/fatigue.  Respiratory: Negative for cough, sputum production, shortness of breath and wheezing.   Cardiovascular: Negative for chest pain and leg swelling.  Gastrointestinal: Negative for constipation, diarrhea, nausea and vomiting.      OBJECTIVE: Vitals:   01/27/18 0542  01/27/18 1347 01/27/18 2129 01/28/18 0548  BP: 122/70 (!) 145/82 131/68 138/77  Pulse: (!) 108 (!) 104 (!) 108 (!) 105  Resp: 19 20 18 18   Temp: 98.9 F (37.2 C) 98.7 F (37.1 C) 98.8 F (37.1 C) 98.4 F (36.9 C)  TempSrc: Oral Oral Oral Oral  SpO2: 99% 98% 97% 97%  Weight:      Height:       Body mass index is 24.44 kg/m.  Physical Exam  Constitutional: He is oriented to person, place, and time. He appears well-developed and well-nourished. No distress.  Cardiovascular: Normal rate, regular rhythm, normal heart sounds and intact distal pulses.  Pulmonary/Chest: Effort normal and breath sounds normal.  Abdominal: Soft. Bowel sounds are normal. There is tenderness in the right lower quadrant. There is no rigidity, no rebound, no guarding and negative Murphy's sign.  JP in right lower quadrant with continued purulent appearing drainage.   Neurological: He is alert and oriented to person, place, and time.  Skin: Skin is warm and dry.  Psychiatric: He has a normal mood and affect. His behavior is normal. Judgment and thought content normal.    Lab Results Lab Results  Component Value Date   WBC 26.5 (H) 01/28/2018   HGB 8.1 (L) 01/28/2018   HCT 26.3 (L) 01/28/2018   MCV 78.7 (L) 01/28/2018   PLT 796 (H) 01/28/2018    Lab Results  Component Value Date   CREATININE 0.82 01/28/2018   BUN 11 01/28/2018  NA 129 (L) 01/28/2018   K 3.7 01/28/2018   CL 93 (L) 01/28/2018   CO2 25 01/28/2018    Lab Results  Component Value Date   ALT 15 01/17/2018   AST 19 01/17/2018   ALKPHOS 152 (H) 01/17/2018   BILITOT 0.7 01/17/2018     Microbiology: Recent Results (from the past 240 hour(s))  Culture, Urine     Status: None   Collection Time: 01/19/18 11:04 AM  Result Value Ref Range Status   Specimen Description   Final    URINE, CLEAN CATCH Performed at [facility], [address] [place], [place] [address]    Special Requests   Final    NONE Performed at [facility], [address], [place], [place] [address]    Culture   Final    Multiple bacterial morphotypes present, none predominant. Suggest appropriate recollection if clinically indicated.   Report Status 01/20/2018 FINAL  Final  Culture, blood (Routine X 2) w Reflex to ID Panel     Status: None   Collection Time: 01/19/18 11:26 AM  Result Value Ref Range Status   Specimen Description RIGHT ANTECUBITAL  Final   Special Requests   Final    BOTTLES DRAWN AEROBIC AND ANAEROBIC Blood Culture adequate volume   Culture   Final    NO GROWTH 5 DAYS Performed at [facility], [address], [place], [place] [address]    Report Status 01/24/2018 FINAL  Final  Culture, blood (Routine X 2) w Reflex to ID Panel     Status: None   Collection Time: 01/19/18 11:37 AM  Result Value Ref Range Status   Specimen Description BLOOD LEFT HAND  Final   Special Requests   Final    BOTTLES DRAWN AEROBIC AND ANAEROBIC Blood Culture adequate volume   Culture   Final    NO GROWTH 5 DAYS Performed at [facility], [address], [place], [place] [address]    Report Status 01/24/2018 FINAL  Final  Aerobic/Anaerobic Culture (surgical/deep wound)     Status: None (Preliminary result)   Collection Time: 01/25/18 11:19 AM  Result Value Ref Range Status   Specimen Description ABSCESS  Final   Special Requests Normal  Final   Gram Stain   Final    ABUNDANT WBC PRESENT, PREDOMINANTLY PMN MODERATE GRAM POSITIVE RODS MODERATE GRAM POSITIVE COCCI FEW GRAM NEGATIVE RODS    Culture   Final    MODERATE ENTEROCOCCUS FAECALIS HOLDING FOR POSSIBLE ANAEROBE Performed at [facility], [place] [address], [place], [place] [address]    Report Status PENDING  Incomplete   Organism ID, Bacteria ENTEROCOCCUS FAECALIS  Final      Susceptibility   Enterococcus faecalis - MIC*    AMPICILLIN <=2 SENSITIVE Sensitive     VANCOMYCIN 1 SENSITIVE Sensitive     GENTAMICIN SYNERGY RESISTANT Resistant     * MODERATE ENTEROCOCCUS  FAECALIS  Surgical pcr screen     Status: Abnormal   Collection Time: 01/28/18  1:15 AM  Result Value Ref Range Status   MRSA, PCR POSITIVE (A) NEGATIVE Final    Comment: RESULT CALLED TO, READ BACK BY AND VERIFIED WITH: HARGROVE,C RN [id number] 01/28/18 MITCHELL,L    Staphylococcus aureus POSITIVE (A) NEGATIVE Final    Comment: (NOTE) The Xpert SA Assay (FDA approved for NASAL specimens in patients 93 years of age and older), is one component of a comprehensive surveillance program. It is not intended to diagnose infection nor to guide  or monitor treatment.      Terri Piedra, NP [facility] for [place] [facility] [phone] Pager  01/28/2018  11:59 AM

## 2018-01-28 NOTE — Progress Notes (Signed)
I've reviewed the scans.  MRI left thigh ordered to evaluate for abscess.  The pelvic abscesses are outside my scope of practice given their anatomic location.  General surgery has determined that this is also outside scope of their practice.  IR does not feel they are able to place multiple drains.  We will await results of MRI to determine further course of treatment.

## 2018-01-28 NOTE — Progress Notes (Signed)
Patient had a full breakfast this morning - sausage, eggs, potatoes, etc.  Nurse collected the tray at 0900.  Dr. Stann Mainland aware, Dr. Tobias Alexander aware and stated that earliest case could go would be 1700.  Nurse on floor aware that patient needs to be NPO and that case would go today.  Pat at [employer] is also aware.

## 2018-01-29 ENCOUNTER — Encounter (HOSPITAL_COMMUNITY)
Admission: EM | Disposition: A | Discharge status: Skilled Nursing Facility | Payer: Self-pay | Source: Home / Self Care | Attending: Internal Medicine

## 2018-01-29 ENCOUNTER — Inpatient Hospital Stay (HOSPITAL_COMMUNITY): Payer: Medicare Other

## 2018-01-29 DIAGNOSIS — M729 Fibroblastic disorder, unspecified: Secondary | ICD-10-CM

## 2018-01-29 DIAGNOSIS — M25452 Effusion, left hip: Secondary | ICD-10-CM

## 2018-01-29 LAB — CREATININE, SERUM
Creatinine, Ser: 0.83 mg/dL (ref 0.61–1.24)
GFR calc Af Amer: >60 mL/min (ref >60)
GFR calc non Af Amer: >60 mL/min (ref >60)

## 2018-01-29 LAB — GLUCOSE, CAPILLARY
GLUCOSE-CAPILLARY: 161 mg/dL — AB (ref 70–99)
Glucose-Capillary: 117 mg/dL — ABNORMAL HIGH (ref 70–99)
Glucose-Capillary: 50 mg/dL — ABNORMAL LOW (ref 70–99)
Glucose-Capillary: 74 mg/dL (ref 70–99)
Glucose-Capillary: 197 mg/dL — ABNORMAL HIGH (ref 70–99)

## 2018-01-29 SURGERY — IRRIGATION AND DEBRIDEMENT EXTREMITY
Anesthesia: Choice | Laterality: Left

## 2018-01-29 MED ORDER — FENTANYL CITRATE (PF) 100 MCG/2ML IJ SOLN
INTRAMUSCULAR | Status: AC | PRN
  Administered 2018-01-29: 50 ug via INTRAVENOUS

## 2018-01-29 MED ORDER — SODIUM CHLORIDE 0.9 % IV SOLN
INTRAVENOUS | Status: DC
  Administered 2018-01-29 – 2018-01-31 (×3): via INTRAVENOUS

## 2018-01-29 MED ORDER — MIDAZOLAM HCL 2 MG/2ML IJ SOLN
INTRAMUSCULAR | Status: AC
  Filled 2018-01-29: qty 2

## 2018-01-29 MED ORDER — DEXTROSE 50 % IV SOLN
25.0000 g | Freq: Once | INTRAVENOUS | Status: AC
  Administered 2018-01-29: 25 g via INTRAVENOUS

## 2018-01-29 MED ORDER — INSULIN DETEMIR 100 UNIT/ML ~~LOC~~ SOLN
10.0000 [IU] | Freq: Every day | SUBCUTANEOUS | Status: DC
  Administered 2018-01-29 – 2018-01-30 (×2): 10 [IU] via SUBCUTANEOUS
  Filled 2018-01-29 (×2): qty 0.1

## 2018-01-29 MED ORDER — ENOXAPARIN SODIUM 40 MG/0.4ML ~~LOC~~ SOLN
40.0000 mg | SUBCUTANEOUS | Status: DC
  Administered 2018-01-29 – 2018-02-09 (×12): 40 mg via SUBCUTANEOUS
  Filled 2018-01-29 (×12): qty 0.4

## 2018-01-29 MED ORDER — LIDOCAINE HCL (PF) 1 % IJ SOLN
INTRAMUSCULAR | Status: AC
  Filled 2018-01-29: qty 30

## 2018-01-29 MED ORDER — SODIUM CHLORIDE 0.9% FLUSH
5.0000 mL | Freq: Three times a day (TID) | INTRAVENOUS | Status: DC
  Administered 2018-01-29 – 2018-02-17 (×48): 5 mL

## 2018-01-29 MED ORDER — DEXTROSE 50 % IV SOLN
INTRAVENOUS | Status: AC
  Filled 2018-01-29: qty 50

## 2018-01-29 MED ORDER — MIDAZOLAM HCL 2 MG/2ML IJ SOLN
INTRAMUSCULAR | Status: AC | PRN
  Administered 2018-01-29: 1 mg via INTRAVENOUS

## 2018-01-29 MED ORDER — FENTANYL CITRATE (PF) 100 MCG/2ML IJ SOLN
INTRAMUSCULAR | Status: AC
  Filled 2018-01-29: qty 2

## 2018-01-29 NOTE — Procedures (Signed)
Interventional Radiology Procedure Note  Procedure:  1.) Placement of a 35F drain into the largest portion of the left thigh abscess with aspiration of 60 mL thick, purulent material 2.) Additional US guided aspiration of smaller abscesses within the hip adductor musculature yielding < 10 mL purulent material.   Complications: None  Estimated Blood Loss: None  Recommendations: - Sample sent for culture - Drain left to JP bulb  Signed,  Criselda Peaches, MD

## 2018-01-29 NOTE — Progress Notes (Signed)
I reviewed MRI and discussed with Dr. Zigmund Daniel.  I believe the large abscess between rectus and intermedius may be amenable to IR drainage.  Dr. Zigmund Daniel will re-consult IR to discuss.  Keep NPO for now.

## 2018-01-29 NOTE — Progress Notes (Signed)
Chief Complaint: Patient was seen in consultation today for thigh abscess at the request of Dr. Jacki Cones  Referring Physician(s): Dr. Jacki Cones  Supervising Physician: Jacqulynn Cadet  Patient Status: [facility] - In-pt  History of Present Illness: Anthony Dunlap is a 84 y.o. male admitted with sepsis secondary to bacteremia with Peptostreptococcus and bacteroids.CT scan of the abdomen and pelvis showing multiple intramuscular abscesses. He underwent CT guided drainage of right rectus abdominus sheath abscess on 12/7. Another CT was done on 12/9 due to persistent fever and thigh/groin swelling. Multiple fluid and gas collections in the proximal left thigh, not included in their entirety. Some of the included portions are slightly larger and some are slightly smaller and these have some peripheral rim enhancement, compatible with multiple abscesses. Persistent findings compatible with abscess formation within and surrounding the symphysis pubis with mild changes of probable osteomyelitis. MRI of the left thigh has been done showing severe myositis and likely fasciitis in the left thigh, with a long vertically oriented anterior compartmental abscess, considerable abscess formation with speckles of gas density along the hip adductor musculature, abnormal muscle edema most severely affecting the anterior compartment, and severe subcutaneous edema of the perineum, scrotum, and left thigh.  Orthopedic and general surgery were consulted for consideration of surgical I&D , but have declined.  Therefore, IR is asked to perform image guided aspiration/drainage of these collections. Imaging, meds, labs, reviewed. Family at bedside Pt has been NPO today.  Past Medical History:  Diagnosis Date  . Diabetes ([place])   . Left eye pain   . Prostate cancer [facility])     Past Surgical History:  Procedure Laterality Date  . HERNIA REPAIR    . PROSTATE SURGERY      Allergies: Patient  has no known allergies.  Medications:  Current Facility-Administered Medications:  .  0.9 %  sodium chloride infusion, , Intravenous, PRN, Georgette Shell, MD, Stopped at 01/25/18 0105 .  acetaminophen (TYLENOL) tablet 650 mg, 650 mg, Oral, Q6H PRN, Tat, Zayvon, MD, 650 mg at 01/25/18 2132 .  Ampicillin-Sulbactam (UNASYN) 3 g in sodium chloride 0.9 % 100 mL IVPB, 3 g, Intravenous, Q6H, Mancheril, Darnell Level, RPH, Last Rate: 200 mL/hr at 01/29/18 1015, 3 g at 01/29/18 1015 .  aspirin EC tablet 81 mg, 81 mg, Oral, Daily, Tat, Athanasius, MD, 81 mg at 01/28/18 1830 .  Chlorhexidine Gluconate Cloth 2 % PADS 6 each, 6 each, Topical, Q0600, Georgette Shell, MD, 6 each at 01/29/18 [phone]  feeding supplement (GLUCERNA SHAKE) (GLUCERNA SHAKE) liquid 237 mL, 237 mL, Oral, BID BM, Tat, Nazeer, MD, 237 mL at 01/28/18 1341 .  ibuprofen (ADVIL,MOTRIN) tablet 400 mg, 400 mg, Oral, Q12H PRN, Tat, Shah, MD .  insulin aspart (novoLOG) injection 0-9 Units, 0-9 Units, Subcutaneous, TID WC, Orson Eva, MD, 3 Units at 01/28/18 1826 .  insulin detemir (LEVEMIR) injection 16 Units, 16 Units, Subcutaneous, QHS, Georgette Shell, MD, 16 Units at 01/28/18 2258 .  levothyroxine (SYNTHROID, LEVOTHROID) tablet 25 mcg, 25 mcg, Oral, Q0600, Orson Eva, MD, 25 mcg at 01/29/18 0547 .  mupirocin ointment (BACTROBAN) 2 % 1 application, 1 application, Nasal, BID, Georgette Shell, MD, 1 application at 24/58/09 1013 .  ondansetron (ZOFRAN) injection 4 mg, 4 mg, Intravenous, Q6H PRN, Tat, Foye, MD, 4 mg at 01/18/18 1014 .  povidone-iodine 10 % swab 2 application, 2 application, Topical, Once, Paralee Cancel, MD .  pravastatin (PRAVACHOL) tablet 10 mg, 10 mg, Oral, q1800, Tat,  Shanon Brow, MD, 10 mg at 01/28/18 1831 .  sodium chloride flush (NS) 0.9 % injection 5 mL, 5 mL, Intracatheter, Q8H, Aletta Edouard, MD, 5 mL at 01/29/18 0547    Family History  Problem Relation Age of Onset  . Pneumonia Father   . Cancer Sister      Social History   Socioeconomic History  . Marital status: Married    Spouse name: Anthony Dunlap  . Number of children: 3  . Years of education: 9th  . Highest education level: Not on file  Occupational History    Employer: RETIRED    Comment: Retired  Scientific laboratory technician  . Financial resource strain: Not on file  . Food insecurity:    Worry: Not on file    Inability: Not on file  . Transportation needs:    Medical: Not on file    Non-medical: Not on file  Tobacco Use  . Smoking status: Never Smoker  . Smokeless tobacco: Never Used  Substance and Sexual Activity  . Alcohol use: No  . Drug use: No  . Sexual activity: Not on file  Lifestyle  . Physical activity:    Days per week: Not on file    Minutes per session: Not on file  . Stress: Not on file  Relationships  . Social connections:    Talks on phone: Not on file    Gets together: Not on file    Attends religious service: Not on file    Active member of club or organization: Not on file    Attends meetings of clubs or organizations: Not on file    Relationship status: Not on file  Other Topics Concern  . Not on file  Social History Narrative   Patient lives at home with his wife. Anthony Dunlap) . Patient is retired.   Education 9th grade.   Right handed.   Caffeine None    Review of Systems: A 12 point ROS discussed and pertinent positives are indicated in the HPI above.  All other systems are negative.  Review of Systems  Vital Signs: BP 129/73 (BP Location: Right Arm)   Pulse 97   Temp 98.4 F (36.9 C) (Oral)   Resp 18   Ht 5\' 8"  (1.727 m)   Wt 72.9 kg   SpO2 98%   BMI 24.44 kg/m   Physical Exam  Constitutional: He is oriented to person, place, and time. He appears well-developed. No distress.  HENT:  Mouth/Throat: Oropharynx is clear and moist.  Neck: Normal range of motion. No JVD present. No tracheal deviation present.  Cardiovascular: Normal rate, regular rhythm and normal heart sounds.   Pulmonary/Chest: Effort normal and breath sounds normal. No respiratory distress.  Abdominal:  (R)lower abd drain intact. Purulent output.  Musculoskeletal:  Marked edema of the left thigh with evident erythema and tenderness.  Neurological: He is alert and oriented to person, place, and time.  Skin: Skin is warm and dry.      Imaging: Dg Chest 2 View  Result Date: 01/03/2018 CLINICAL DATA:  Sepsis, fever, no pain EXAM: CHEST - 2 VIEW COMPARISON:  09/26/2007 FINDINGS: There is no focal parenchymal opacity. There is no pleural effusion or pneumothorax. The heart and mediastinal contours are unremarkable. There is mild osteoarthritis of bilateral glenohumeral joints, left greater than right. IMPRESSION: No active cardiopulmonary disease. Electronically Signed   By: Kathreen Devoid   On: 01/03/2018 20:23   Ct Abdomen Pelvis W Contrast  Result Date: 01/27/2018 CLINICAL DATA:  Acute,  generalized abdominal pain with fever and stool incontinence. Clinical concern for possible upper thigh abscess. EXAM: CT ABDOMEN AND PELVIS WITH CONTRAST TECHNIQUE: Multidetector CT imaging of the abdomen and pelvis was performed using the standard protocol following bolus administration of intravenous contrast. CONTRAST:  170mL OMNIPAQUE IOHEXOL 300 MG/ML  SOLN COMPARISON:  01/25/2018 CT-guided drainage and 01/22/2018 abdomen and pelvis CT. FINDINGS: Lower chest: Small to moderate-sized left pleural effusion and small right pleural effusion. Bilateral lower lobe compressive atelectasis. Dense coronary artery calcifications. Borderline enlarged heart. Hepatobiliary: Small liver cyst. Unremarkable gallbladder. Pancreas: Diffusely atrophied. Spleen: Unremarkable. Adrenals/Urinary Tract: Normal appearing adrenal glands. Bilateral renal cysts. Foley catheter in the urinary bladder. Unremarkable ureters. Stomach/Bowel: Gas distended right, transverse and left colon containing stool and oral contrast. Nondistended mid and distal  sigmoid colon. Mild diffuse rectal wall thickening. Vascular/Lymphatic: Atheromatous arterial calcifications without aneurysm. No enlarged lymph nodes. Reproductive: Multiple prostate radiation seed implants. Moderate to large-sized bilateral scrotal hydroceles. Other: Pigtail catheter within the previously demonstrated right rectus sheath abscess. There is some fluid and gas within the rectus sheath at and above that location, measuring 2.9 x 2.4 cm on image number 77 series 3 with mild peripheral wall enhancement. This measures approximately 15.4 cm in length. Again demonstrated are multiple fluid and gas collections in the proximal left thigh, not included in their entirety. Some of the included portions are slightly larger and some are slightly smaller and these have some peripheral rim enhancement. Again demonstrated is fluid and gas within and surrounding the symphysis pubis with mild focal bone destruction and adjacent bony fragmentation. Diffuse subcutaneous edema. Musculoskeletal: Severe right hip degenerative changes with subarticular cyst formation. Moderate left hip degenerative changes. Lumbar and lower thoracic spine degenerative changes and mild scoliosis. IMPRESSION: 1. 2.9 x 2.4 x 15.4 cm right rectus sheath abscess containing a pigtail drainage catheter. 2. Multiple fluid and gas collections in the proximal left thigh, not included in their entirety. Some of the included portions are slightly larger and some are slightly smaller and these have some peripheral rim enhancement, compatible with multiple abscesses. 3. Persistent findings compatible with abscess formation within and surrounding the symphysis pubis with mild changes of probable osteomyelitis. 4. Mild diffuse rectal wall thickening, most likely due to proctitis. 5. Small to moderate-sized left pleural effusion and small right pleural effusion. 6. Bilateral lower lobe compressive atelectasis. 7. Dense coronary artery atheromatous  calcifications. 8. Moderate to large-sized bilateral scrotal hydroceles. Electronically Signed   By: Claudie Revering M.D.   On: 01/27/2018 00:10   Ct Abdomen Pelvis W Contrast  Result Date: 01/22/2018 CLINICAL DATA:  84 year old male presents with fever and tachycardia with anaerobic bacteremia. Abdominal pain and fever with abscess suspected. EXAM: CT ABDOMEN AND PELVIS WITH CONTRAST TECHNIQUE: Multidetector CT imaging of the abdomen and pelvis was performed using the standard protocol following bolus administration of intravenous contrast. CONTRAST:  131mL ISOVUE-300 IOPAMIDOL (ISOVUE-300) INJECTION 61% COMPARISON:  None. FINDINGS: Lower chest: Heart size is top normal without pericardial effusion. Trace bilateral pleural effusions with adjacent atelectasis are identified at each lung base, left slightly greater than right. Hepatobiliary: 8 mm hypodensity in the subcapsular right hepatic lobe, too small to characterize but may reflect a small cyst or hemangioma. No enhancing mass or biliary dilatation. No abscess. The gallbladder is unremarkable and free of stones. Pancreas: Atrophic pancreas. No mass or ductal dilatation. Inflammation. Spleen: Normal size spleen without focal mass. Adrenals/Urinary Tract: Normal bilateral adrenal glands. Symmetric cortical enhancement both kidneys with bilateral renal  cysts. No nephrolithiasis nor hydroureteronephrosis. The urinary bladder is decompressed by Foley catheter. Stomach/Bowel: Small hiatal hernia is noted. The stomach is nondistended. Small bowel rotation is within limits. Enteric contrast is seen within jejunal loops without mechanical bowel obstruction or inflammation. Moderate stool retention is seen within the cecum and ascending colon with moderate stool also noted in the descending colon. No mural thickening is identified. The rectosigmoid is decompressed in appearance and likely explains the slightly thickened appearance. The appendix is not well visualized.  Vascular/Lymphatic: Mild scattered aortoiliac atherosclerosis without aneurysm. Mild reactive adenopathy suspected of the window nodes without significant pathologic enlargement. The largest contains fat and measure up to 12 mm on right and 10 mm on the left. Reproductive: Brachy therapy seeds are imbedded within the prostate. Other: Soft tissue abscesses are identified about the pelvis and left hip. Mottled gas like lucency with low attenuating fluid is noted along the course of the distal right rectus muscle measuring 7.4 x 2.5 x 3.2 cm, series 6/49 and series 2/76. Additional tracking low-density fluid with mottled gas like lucencies are noted along the course of the left iliopsoas muscle extending to the left hip. Intramuscular abscesses are noted of the pectineus and obturator externus muscles bilaterally, left more prominent than right as well as left quadratus femoris muscle. Soft tissue abscess contiguous in adjacent to pubic symphysis is identified, series 2/81 measuring 3.3 x 4.4 x 4.1 cm. Soft tissue anasarca is noted of the included abdomen pelvis. Small low-density subcutaneous fluid collections are noted along the lateral aspect of both hips without gas like lucencies, right measuring at least 3.7 x 3 cm and on the left 5.5 by 1.9 cm, series 2/86. Musculoskeletal: Subtle cortical bone loss and osteopenia noted along the inferior pubic bone bilaterally. Changes of osteomyelitis is of concern, series 2/87 on bone windows possibly from septic arthritis given presence of adjacent abscess. IMPRESSION: 1. The over arching finding is that of numerous intramuscular abscesses as stated above along the course of the distal right rectus, about the bilateral pectineus, obturator externus and left quadratus femoris muscles as well as left ileo psoas. 2. Small abscess arising off the pubic symphysis is also noted with subtle bone loss and cortical bone destruction raising concern for septic arthritis and  osteomyelitis of the pubic bone. 3. Left greater than right small pleural effusions with atelectasis. 4. Bilateral renal cysts. These results were called by telephone at the time of interpretation on 01/22/2018 at 11:04 pm to NP Chaney Malling, who verbally acknowledged these results. Electronically Signed   By: Ashley Royalty M.D.   On: 01/22/2018 23:04   Mr Femur Left W Wo Contrast  Result Date: 01/29/2018 CLINICAL DATA:  Infection in the left thigh EXAM: MR OF THE LEFT LOWER EXTREMITY WITHOUT AND WITH CONTRAST TECHNIQUE: Multiplanar, multisequence MR imaging of the left thigh was performed both before and after administration of intravenous contrast. CONTRAST:  7 cc Gadavist COMPARISON:  01/26/2018 pelvic CT FINDINGS: Bones/Joint/Cartilage T2 boundary effects, the proximal-most femur including the femoral head, and the distal most femur include knee, are obscured. No abnormal marrow edema or other findings of osteomyelitis involving the remainder of the femur. Small to moderate knee effusion with mild synovitis in the suprapatellar bursa. Ligaments N/A Muscles and Tendons Severe myositis involving the right thigh musculature to include the hip adductor musculature, quadriceps musculature, and to a lesser extent the medial and posterior compartmental musculature. 2.0 by 3.7 cm abscess in the vicinity of the left distal operator  externus/quadratus femoris muscle, image 12/11. 2.2 by 4.1 by 24.1 cm (volume = 110 cm^3) abscess in the anterior compartment interposed between the rectus femoris and vastus intermedius. Extensive edema throughout the anterior compartmental musculature, with associated accentuated muscular enhancement. No obvious current muscle necrosis. Speckled gas is present within along the hip adductor musculature, distal iloipsoas abscess, and some of the anterior compartmental upper thigh abscess including the vastus intermedius and vastus medialis. Soft tissues Severe subcutaneous edema  involving the perineum, scrotum, and left thigh extending down to the knee. Notable edema along the medial superficial side of the gracilis muscle. Contralateral muscular and subcutaneous edema is present, although not as severe is on the left side. Foley catheter is observed. IMPRESSION: 1. Severe myositis and likely fasciitis in the left thigh, with a long vertically oriented anterior compartmental abscess, considerable abscess formation with speckles of gas density along the hip adductor musculature, abnormal muscle edema most severely affecting the anterior compartment, and severe subcutaneous edema of the perineum, scrotum, and left thigh. A left hip effusion is present with mild synovitis, but we don't see definite signs of osteomyelitis with the understanding that the knee and hip are obscured by boundary artifact. 2. Right thigh muscular compartmental and subcutaneous edema is also present, although not as severe as on the left. Electronically Signed   By: Van Clines M.D.   On: 01/29/2018 07:48   Ct Image Guided Drainage By Percutaneous Catheter  Result Date: 01/25/2018 CLINICAL DATA:  Multiple abscesses including a lower right rectus sheath abscess. EXAM: CT GUIDED CATHETER DRAINAGE OF RIGHT RECTUS SHEATH ABSCESS ANESTHESIA/SEDATION: 2.0 mg IV Versed 100 mcg IV Fentanyl Total Moderate Sedation Time:  15 minutes The patient's level of consciousness and physiologic status were continuously monitored during the procedure by Radiology nursing. PROCEDURE: The procedure, risks, benefits, and alternatives were explained to the patient. Questions regarding the procedure were encouraged and answered. The patient understands and consents to the procedure. A time out was performed prior to initiating the procedure. CT was performed through the lower abdomen and pelvis in a supine position. The abdominal wall was prepped with chlorhexidine in a sterile fashion, and a sterile drape was applied covering the  operative field. A sterile gown and sterile gloves were used for the procedure. Local anesthesia was provided with 1% Lidocaine. Under CT guidance, an 18 gauge trocar needle was advanced into the right lower rectus sheath musculature. A guidewire was advanced and the needle removed. The tract was dilated and a 10 French percutaneous drain placed. Drain placement was confirmed by CT. The drainage catheter was flushed and connected to a suction bulb. A sample of fluid was sent for culture analysis. COMPLICATIONS: None FINDINGS: Aspiration at the level of the right rectus sheath abscess revealed purulent fluid. A fluid sample was sent for culture analysis. After placement of a 10 French drain, there is further return of purulent fluid. IMPRESSION: CT-guided drainage of right rectus sheath abscess yielding purulent fluid. A 10 French drainage catheter was placed and attached to suction bulb drainage. Electronically Signed   By: Aletta Edouard M.D.   On: 01/25/2018 13:26    Labs:  CBC: Recent Labs    01/25/18 0348 01/26/18 0322 01/27/18 0228 01/28/18 0159  WBC 23.2* 23.8* 28.4* 26.5*  HGB 7.9* 8.7* 8.3* 8.1*  HCT 25.7* 29.4* 28.0* 26.3*  PLT 678* 762* 884* 796*    COAGS: Recent Labs    01/03/18 1929 01/17/18 0443 01/25/18 0348  INR 1.20 1.31 1.15  APTT  --  37*  --     BMP: Recent Labs    01/25/18 0348 01/26/18 0322 01/27/18 0228 01/28/18 0159 01/29/18 0132  NA 130* 134* 129* 129*  --   K 2.7* 3.9 3.5 3.7  --   CL 94* 97* 94* 93*  --   CO2 24 24 25 25   --   GLUCOSE 186* 140* 180* 301*  --   BUN 12 11 10 11   --   CALCIUM 7.4* 7.7* 7.2* 7.3*  --   CREATININE 0.96 0.79 0.79 0.82 0.83  GFRNONAA >60 >60 >60 >60 >60  GFRAA >60 >60 >60 >60 >60    LIVER FUNCTION TESTS: Recent Labs    01/03/18 1929 01/05/18 0551 01/17/18 0237  BILITOT 1.0 0.3 0.7  AST 26 21 19   ALT 19 19 15   ALKPHOS 132* 141* 152*  PROT 7.8 7.1 7.2  ALBUMIN 3.1* 2.6* 2.5*    TUMOR MARKERS: No results  for input(s): AFPTM, CEA, CA199, CHROMGRNA in the last 8760 hours.  Assessment and Plan: Rectus sheath abscess s/p perc drain 12/7. Osteomyelitic changes of the symphysis pubic with associated gas/fluid collection. Gas/fluid collections of the proximal left anterior thigh with myositis/fasciitis. Imaging reviewed with Dr. Laurence Ferrari. Can attempt image guided aspiration/drainage of these collections. **Pt may still require surgical I&D** Labs reviewed. Risks and benefits discussed with the patient including bleeding, infection, damage to adjacent structures, and sepsis.  All of the patient's questions were answered, patient is agreeable to proceed. Consent signed and in chart.     Thank you for this interesting consult.  I greatly enjoyed meeting TYJON BOWEN and look forward to participating in their care.  A copy of this report was sent to the requesting provider on this date.  Electronically Signed: Ascencion Dike, PA-C 01/29/2018, 11:51 AM   I spent a total of 20 minutes in face to face in clinical consultation, greater than 50% of which was counseling/coordinating care for aspiration/drainage of pelvic/thigh abscesses

## 2018-01-29 NOTE — Progress Notes (Addendum)
PROGRESS NOTE    Anthony Dunlap  HYQ:[id number] DOB: [birth year] DOA: 01/17/2018 PCP: [name], MD  Brief Narrative:84 y.o.malewith medical history significant forhypothyroidism, insulin-dependent diabetes mellitus, hyperlipidemia, and history of prostate cancer with chronic Foley catheter, now presenting to the emergency department for evaluation of rigors. Patient reports that he was in his usual state of health until overnight when he began sweating and developed shaking chills. In the ED, pt was febrile with HR in 140s. He also had serum glucose of 440 with anion gap of 12. He was initially started on IVF and IV insulin. He denies any recent cough, shortness of breath, chest pain, vomiting, or diarrhea. Patient was admitted for sepsis due topresumptive UTI initially. Urine culture was not revealing. Blood cultures grew peptostreptococcus and Bacteroides thetaiodomicron. Antibiotics were adjusted   Assessment & Plan:   Principal Problem:   Enterococcus faecalis infection Active Problems:   Sepsis secondary to UTI ([place])   Hypothyroidism   DKA (diabetic ketoacidoses) ([name])   Normocytic anemia   Sepsis due to Bacteroides species ([name])   Sepsis without acute organ dysfunction ([name])   Psoas abscess ([name])   Abscess  #1 sepsis secondary to bacteremia with Peptostreptococcus and bacteroids.-  CT scan of the abdomen and pelvis showing multiple intramuscular abscesses. 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  Echocardiogram 01/19/2018 no evidence of vegetation.Follow-up blood cultures negative.S/P IR ct guided drainage of right rectus abscess.CTThe over arching finding is that of numerous intramuscular abscesses as stated above along the course of the distal right rectus, about the bilateral pectineus, obturator externus and left quadratus femoris muscles as well as left ileo psoas. Small abscess arising off the pubic symphysis is also noted with subtle bone loss  and cortical bone destruction raising concern for septic arthritis and osteomyelitis of the pubic bone. Left greater than right small pleural effusions with atelectasis. Bilateral renal cysts.  CT scan repeated 01/26/2018 abdomen pelvis and upper thighs shows-2.9 x 2.4 x 15.4 cm right rectus sheath abscess containing a pigtail drainage catheter.. Multiple fluid and gas collections in the proximal left thigh, not included in their entirety. Some of the included portions are slightly larger and some are slightly smaller and these have some peripheral rim enhancement, compatible with multiple abscesses.. Persistent findings compatible with abscess formation within and surrounding the symphysis pubis with mild changes of probable osteomyelitis. Mild diffuse rectal wall thickening, most likely due to proctitis.   Small to moderate-sized left pleural effusion and small right pleural effusion. Bilateral lower lobe compressive atelectasis. Dense coronary artery atheromatous calcifications.  Moderate to large-sized bilateral scrotal hydroceles.  MRI abd pelvis 12/10-anterior compartmental abscess left thigh.D/W dr Erlinda Hong recommends IR intervention.reconsulted IR.  ID following.continues with leukocytosis.needs abscesses drained.IR reconsulted.  dw general surgery who recommended Ortho consult.dw Dr Erlinda Hong recommended IR reevaluation.  2]type 2 dm with hypoglycemia-decrease levemir  3]hypothyroidsm-continue synthroid  4]prostate cancer -has chronic foley    Estimated body mass index is 24.44 kg/m as calculated from the following:   Height as of this encounter: 5\' 8"  (1.727 m).   Weight as of this encounter: 72.9 kg.  DVT prophylaxis:lovenox Code Status:full Family Communication: dw daughter Disposition Plan:  Pending clinical improvement  Consultants:   Ir,id  Procedures: ir drain right abdomen  Antimicrobials: unasyn  Subjective: Resting in bed very weak needs two people  assistance  Objective: Vitals:   01/28/18 0548 01/28/18 1437 01/28/18 2147 01/29/18 0555  BP: 138/77 102/67 (!) 111/58 129/73  Pulse: (!) 105 (!) 105 [name])  106 97  Resp: 18 16 18 18   Temp: 98.4 F (36.9 C) 98.5 F (36.9 C) 98.6 F (37 C) 98.4 F (36.9 C)  TempSrc: Oral Oral Oral Oral  SpO2: 97% (!) 78% 97% 98%  Weight:      Height:        Intake/Output Summary (Last 24 hours) at 01/29/2018 1111 Last data filed at 01/29/2018 0855 Gross per 24 hour  Intake 952.42 ml  Output 581 ml  Net 371.42 ml   Filed Weights   01/17/18 0209 01/17/18 0600 01/23/18 2337  Weight: 70.3 kg 67.1 kg 72.9 kg    Examination:foley in place drain in place   General exam: Appears calm and comfortable  Respiratory system: Clear to auscultation. Respiratory effort normal. Cardiovascular system: S1 & S2 heard, RRR. No JVD, murmurs, rubs, gallops or clicks. No pedal edema. Gastrointestinal system: Abdomen is nondistended, soft and nontender. No organomegaly or masses felt. Normal bowel sounds heard. Central nervous system: Alert and oriented. No focal neurological deficits. Extremities:left upper thigh swollen erythematous tender  Skin: No rashes, lesions or ulcers Psychiatry: Judgement and insight appear normal. Mood & affect appropriate.     Data Reviewed: I have personally reviewed following labs and imaging studies  CBC: Recent Labs  Lab 01/23/18 0520 01/25/18 0348 01/26/18 0322 01/27/18 0228 01/28/18 0159  WBC 20.2* 23.2* 23.8* 28.4* 26.5*  NEUTROABS  --  19.6*  --   --   --   HGB 8.0* 7.9* 8.7* 8.3* 8.1*  HCT 26.0* 25.7* 29.4* 28.0* 26.3*  MCV 78.8* 77.6* 80.3 79.3* 78.7*  PLT 613* 678* 762* 884* 831*   Basic Metabolic Panel: Recent Labs  Lab 01/23/18 0520 01/25/18 0348 01/26/18 0322 01/27/18 0228 01/28/18 0159 01/29/18 0132  NA 126* 130* 134* 129* 129*  --   K 3.2* 2.7* 3.9 3.5 3.7  --   CL 92* 94* 97* 94* 93*  --   CO2 24 24 24 25 25   --   GLUCOSE 250* 186* 140* 180*  301*  --   BUN 14 12 11 10 11   --   CREATININE 0.70 0.96 0.79 0.79 0.82 0.83  CALCIUM 7.2* 7.4* 7.7* 7.2* 7.3*  --    GFR: Estimated Creatinine Clearance: 66.4 mL/min (by C-G formula based on SCr of 0.83 mg/dL). Liver Function Tests: No results for input(s): AST, ALT, ALKPHOS, BILITOT, PROT, ALBUMIN in the last 168 hours. No results for input(s): LIPASE, AMYLASE in the last 168 hours. No results for input(s): AMMONIA in the last 168 hours. Coagulation Profile: Recent Labs  Lab 01/25/18 0348  INR 1.15   Cardiac Enzymes: No results for input(s): CKTOTAL, CKMB, CKMBINDEX, TROPONINI in the last 168 hours. BNP (last 3 results) No results for input(s): PROBNP in the last 8760 hours. HbA1C: No results for input(s): HGBA1C in the last 72 hours. CBG: Recent Labs  Lab 01/28/18 0753 01/28/18 1232 01/28/18 1800 01/28/18 2144 01/29/18 0841  GLUCAP 163* 167* 243* 271* 117*   Lipid Profile: No results for input(s): CHOL, HDL, LDLCALC, TRIG, CHOLHDL, LDLDIRECT in the last 72 hours. Thyroid Function Tests: No results for input(s): TSH, T4TOTAL, FREET4, T3FREE, THYROIDAB in the last 72 hours. Anemia Panel: No results for input(s): VITAMINB12, FOLATE, FERRITIN, TIBC, IRON, RETICCTPCT in the last 72 hours. Sepsis Labs: No results for input(s): PROCALCITON, LATICACIDVEN in the last 168 hours.  Recent Results (from the past 240 hour(s))  Culture, blood (Routine X 2) w Reflex to ID Panel     Status: None  Collection Time: 01/19/18 11:26 AM  Result Value Ref Range Status   Specimen Description RIGHT ANTECUBITAL  Final   Special Requests   Final    BOTTLES DRAWN AEROBIC AND ANAEROBIC Blood Culture adequate volume   Culture   Final    NO GROWTH 5 DAYS Performed at [facility], [address] [place], [place] [address]    Report Status 01/24/2018 FINAL  Final  Culture, blood (Routine X 2) w Reflex to ID Panel     Status: None   Collection Time: 01/19/18 11:37 AM  Result Value Ref Range  Status   Specimen Description BLOOD LEFT HAND  Final   Special Requests   Final    BOTTLES DRAWN AEROBIC AND ANAEROBIC Blood Culture adequate volume   Culture   Final    NO GROWTH 5 DAYS Performed at [facility], [address], [place], [place] [address]    Report Status 01/24/2018 FINAL  Final  Aerobic/Anaerobic Culture (surgical/deep wound)     Status: None (Preliminary result)   Collection Time: 01/25/18 11:19 AM  Result Value Ref Range Status   Specimen Description ABSCESS  Final   Special Requests Normal  Final   Gram Stain   Final    ABUNDANT WBC PRESENT, PREDOMINANTLY PMN MODERATE GRAM POSITIVE RODS MODERATE GRAM POSITIVE COCCI FEW GRAM NEGATIVE RODS    Culture   Final    MODERATE ENTEROCOCCUS FAECALIS MODERATE BACTEROIDES THETAIOTAOMICRON BETA LACTAMASE NEGATIVE Performed at [facility], [place] [address], [place],  [address]    Report Status PENDING  Incomplete   Organism ID, Bacteria ENTEROCOCCUS FAECALIS  Final      Susceptibility   Enterococcus faecalis - MIC*    AMPICILLIN <=2 SENSITIVE Sensitive     VANCOMYCIN 1 SENSITIVE Sensitive     GENTAMICIN SYNERGY RESISTANT Resistant     * MODERATE ENTEROCOCCUS FAECALIS  Surgical pcr screen     Status: Abnormal   Collection Time: 01/28/18  1:15 AM  Result Value Ref Range Status   MRSA, PCR POSITIVE (A) NEGATIVE Final    Comment: RESULT CALLED TO, READ BACK BY AND VERIFIED WITH: HARGROVE,C RN 0308 01/28/18 MITCHELL,L    Staphylococcus aureus POSITIVE (A) NEGATIVE Final    Comment: (NOTE) The Xpert SA Assay (FDA approved for NASAL specimens in patients 85 years of age and older), is one component of a comprehensive surveillance program. It is not intended to diagnose infection nor to guide or monitor treatment.          Radiology Studies: Mr Femur Left W Wo Contrast  Result Date: 01/29/2018 CLINICAL DATA:  Infection in the left thigh EXAM: MR OF THE LEFT LOWER EXTREMITY WITHOUT AND WITH CONTRAST  TECHNIQUE: Multiplanar, multisequence MR imaging of the left thigh was performed both before and after administration of intravenous contrast. CONTRAST:  7 cc Gadavist COMPARISON:  01/26/2018 pelvic CT FINDINGS: Bones/Joint/Cartilage T2 boundary effects, the proximal-most femur including the femoral head, and the distal most femur include knee, are obscured. No abnormal marrow edema or other findings of osteomyelitis involving the remainder of the femur. Small to moderate knee effusion with mild synovitis in the suprapatellar bursa. Ligaments N/A Muscles and Tendons Severe myositis involving the right thigh musculature to include the hip adductor musculature, quadriceps musculature, and to a lesser extent the medial and posterior compartmental musculature. 2.0 by 3.7 cm abscess in the vicinity of the left distal operator externus/quadratus femoris muscle, image 12/11. 2.2 by 4.1 by 24.1 cm (volume = 110  cm^3) abscess in the anterior compartment interposed between the rectus femoris and vastus intermedius. Extensive edema throughout the anterior compartmental musculature, with associated accentuated muscular enhancement. No obvious current muscle necrosis. Speckled gas is present within along the hip adductor musculature, distal iloipsoas abscess, and some of the anterior compartmental upper thigh abscess including the vastus intermedius and vastus medialis. Soft tissues Severe subcutaneous edema involving the perineum, scrotum, and left thigh extending down to the knee. Notable edema along the medial superficial side of the gracilis muscle. Contralateral muscular and subcutaneous edema is present, although not as severe is on the left side. Foley catheter is observed. IMPRESSION: 1. Severe myositis and likely fasciitis in the left thigh, with a long vertically oriented anterior compartmental abscess, considerable abscess formation with speckles of gas density along the hip adductor musculature, abnormal muscle edema  most severely affecting the anterior compartment, and severe subcutaneous edema of the perineum, scrotum, and left thigh. A left hip effusion is present with mild synovitis, but we don't see definite signs of osteomyelitis with the understanding that the knee and hip are obscured by boundary artifact. 2. Right thigh muscular compartmental and subcutaneous edema is also present, although not as severe as on the left. Electronically Signed   By: Van Clines M.D.   On: 01/29/2018 07:48        Scheduled Meds: . aspirin EC  81 mg Oral Daily  . Chlorhexidine Gluconate Cloth  6 each Topical Q0600  . feeding supplement (GLUCERNA SHAKE)  237 mL Oral BID BM  . insulin aspart  0-9 Units Subcutaneous TID WC  . insulin detemir  16 Units Subcutaneous QHS  . levothyroxine  25 mcg Oral Q0600  . mupirocin ointment  1 application Nasal BID  . povidone-iodine  2 application Topical Once  . pravastatin  10 mg Oral q1800  . sodium chloride flush  5 mL Intracatheter Q8H   Continuous Infusions: . sodium chloride Stopped (01/25/18 0105)  . ampicillin-sulbactam (UNASYN) IV 3 g (01/29/18 1015)     LOS: 12 days     Georgette Shell, MD [facility]   If 7PM-7AM, please contact night-coverage [email or web address] Password [email or web address] 01/29/2018, 11:11 AM

## 2018-01-29 NOTE — Progress Notes (Signed)
Hypoglycemic Event  CBG: 50  Treatment:Dextrose 39ml   Symptoms:   Follow-up CBG: Time:1250 CBG Result:161  Possible Reasons for Event:   Comments/MD notified:    Devona Konig

## 2018-01-29 NOTE — Progress Notes (Signed)
Inpatient Diabetes Program Recommendations  AACE/ADA: New Consensus Statement on Inpatient Glycemic Control (2015)  Target Ranges:  Prepandial:   less than 140 mg/dL      Peak postprandial:   less than 180 mg/dL (1-2 hours)      Critically ill patients:  140 - 180 mg/dL   Lab Results  Component Value Date   GLUCAP 161 (H) 01/29/2018   HGBA1C 9.6 (H) 01/04/2018    Review of Glycemic Control Results for Anthony Dunlap, Anthony Dunlap (MRN [id number]) as of 01/29/2018 13:08  Ref. Range 01/28/2018 21:44 01/29/2018 08:41 01/29/2018 12:39 01/29/2018 12:55  Glucose-Capillary Latest Ref Range: 70 - 99 mg/dL 271 (H) 117 (H) 50 (L) 161 (H)   Inpatient Diabetes Program Recommendations: Insulin - Basal: Noted glucose 50 mg/dl this morning @ 12:39. Please consider decreasing Levemir to 14 units QHS.  Thank you, Nani Gasser. Chanee Henrickson, RN, MSN, CDE  Diabetes Coordinator Inpatient Glycemic Control Team Team Pager [phone] (8am-5pm) 01/29/2018 1:09 PM

## 2018-01-29 NOTE — Plan of Care (Signed)
  Problem: Coping: Goal: Level of anxiety will decrease Outcome: Progressing   Problem: Pain Managment: Goal: General experience of comfort will improve Outcome: Progressing   

## 2018-01-29 NOTE — Clinical Social Work Note (Signed)
Clinical Social Work Assessment  Patient Details  Name: Anthony Dunlap MRN: [id number] Date of Birth: December 30, 1933  Date of referral:  01/29/18               Reason for consult:  Facility Placement, Discharge Planning                Permission sought to share information with:  Family Supports, Customer service manager Permission granted to share information::  Yes, Verbal Permission Granted  Name::     Anthony Dunlap, and Anthony Dunlap  Agency::  [facility] and [facility] after procedure  Relationship::  daughters on Nurse, children's Information:  see facesheet, numbers confirmed  Housing/Transportation Living arrangements for the past 2 months:  Single Family Home Source of Information:  Patient, Adult Children Patient Interpreter Needed:  None Criminal Activity/Legal Involvement Pertinent to Current Situation/Hospitalization:  No - Comment as needed Significant Relationships:  Adult Children, Other Family Members, Spouse Lives with:  Spouse Do you feel safe going back to the place where you live?  Yes Need for family participation in patient care:  Yes (Comment)  Care giving concerns:  Pt lives with his wife who has dementia, requiring more assistance with ADLs and IADLs than at baseline. Pt will have an additional procedure and feels he may benefit from SNF.    Social Worker assessment / plan:  CSW spoke with pt and pt daughter Anthony Dunlap at bedside. Pt anxious and ready for his procedure today. Introduced self, role, and reason for visit. Pt is from home with his wife who has dementia. He has three daughters involved in his care and that live near him in [place]. Pt wife has been to SNF before so he and his family are familiar with "nursing homes." Pt states that he would like to go home but knows rehab may be best before returning. Pt interested in [facility] and [facility] should he still need SNF after procedure.  Permission obtained to speak with all  three daughters and pt requested we not call pts wife due to cognitive status she would not be able to answer questions appropriately.  Will follow for eval post procedure to initiate insurance authorization.  Employment status:  Retired Nurse, adult PT Recommendations:  [place], [place] / Referral to community resources:  [place]  Patient/Family's Response to care:  Pt amenable to SNF placement but wants to see how he does post surgery. Was friendly and open to talking to CSW.  Patient/Family's Understanding of and Emotional Response to Diagnosis, Current Treatment, and Prognosis:  Pt states understanding of diagnosis, current treatment and prognosis. Pt and pt daughter both emotionally appropriate and it appears that his daughters are very supportive of pt and his wife. Pt appears happy with care here but anxious for his procedure.   Emotional Assessment Appearance:  Appears stated age Attitude/Demeanor/Rapport:  Engaged, Gracious Affect (typically observed):  Accepting, Adaptable, Appropriate Orientation:  Oriented to Place, Oriented to Self Alcohol / Substance use:  Not Applicable Psych involvement (Current and /or in the community):  No (Comment)  Discharge Needs  Concerns to be addressed:  Care Coordination Readmission within the last 30 days:  Yes Current discharge risk:  Physical Impairment Barriers to Discharge:  Ship broker, Continued Medical Work up   [employer], [place] 01/29/2018, 1:32 PM

## 2018-01-29 NOTE — Progress Notes (Signed)
[facility] for Infectious Disease  Date of Admission:  01/17/2018     Total days of antibiotics 13         ASSESSMENT/PLAN  Anthony Dunlap is on Day 13 of antimicrobial therapy (Day 4 of ampicillin-sulbactam) for Enterococcus faecalis osteomyelitis of the pubic symphysis with abscesses in the rectus sheath and left thigh. Awaiting surgical decisions or potential for additional drain placement for IR. Drain placement or surgical intervention would be ideal as he has continued to have elevated leukocytosis and the burden of his current infection. Fortunately he has remained stable and has also been afebrile.  1. Continue current dose of ampicillin-sulbactam. 2. Await potential for surgical intervention as able.    Principal Problem:   Enterococcus faecalis infection Active Problems:   Psoas abscess ([name])   Sepsis secondary to UTI ([place])   Hypothyroidism   DKA (diabetic ketoacidoses) ([name])   Normocytic anemia   Sepsis due to Bacteroides species ([name])   Sepsis without acute organ dysfunction ([name])   Abscess   . aspirin EC  81 mg Oral Daily  . Chlorhexidine Gluconate Cloth  6 each Topical Q0600  . feeding supplement (GLUCERNA SHAKE)  237 mL Oral BID BM  . insulin aspart  0-9 Units Subcutaneous TID WC  . insulin detemir  16 Units Subcutaneous QHS  . levothyroxine  25 mcg Oral Q0600  . mupirocin ointment  1 application Nasal BID  . povidone-iodine  2 application Topical Once  . pravastatin  10 mg Oral q1800  . sodium chloride flush  5 mL Intracatheter Q8H    SUBJECTIVE:  Afebrile overnight. MRI with severe myositis and fasciitis in the left thigh with severe myositis and likely fasciitis in the left thigh, with a long vertically oriented anterior compartmental abscess, considerable abscess formation with speckles of gas density along the hip adductor musculature, abnormal muscle edema most severely affecting the anterior compartment, and severe subcutaneous edema of the  perineum, scrotum, and left thigh. A left hip effusion is present with mild synovitis, without  definite signs of osteomyelitis with the understanding that the knee and hip are obscured by boundary artifact.  He is feeling good with no complaints. Pain only when he pushes on his abdomen.    No Known Allergies   Review of Systems: Review of Systems  Constitutional: Negative for chills and fever.  Respiratory: Negative for cough, hemoptysis, sputum production and shortness of breath.   Cardiovascular: Negative for chest pain and leg swelling.  Gastrointestinal: Negative for abdominal pain, constipation, diarrhea, nausea and vomiting.  Skin: Negative for rash.    OBJECTIVE: Vitals:   01/28/18 0548 01/28/18 1437 01/28/18 2147 01/29/18 0555  BP: 138/77 102/67 (!) 111/58 129/73  Pulse: (!) 105 (!) 105 (!) 106 97  Resp: 18 16 18 18   Temp: 98.4 F (36.9 C) 98.5 F (36.9 C) 98.6 F (37 C) 98.4 F (36.9 C)  TempSrc: Oral Oral Oral Oral  SpO2: 97% (!) 78% 97% 98%  Weight:      Height:       Body mass index is 24.44 kg/m.  Physical Exam  Constitutional: He appears well-developed. He appears ill. No distress.  Lying in bed with head of bed elevated.   Cardiovascular: Normal rate, regular rhythm and normal heart sounds. Exam reveals no gallop and no friction rub.  No murmur heard. Pulmonary/Chest: Effort normal. No stridor. No respiratory distress. He has no wheezes. He has no rales.  Abdominal:  JP drain remains patent with  apparent purulent drainage.   Neurological: He is alert.  Skin: Skin is warm and dry.  Psychiatric: He has a normal mood and affect.    Lab Results Lab Results  Component Value Date   WBC 26.5 (H) 01/28/2018   HGB 8.1 (L) 01/28/2018   HCT 26.3 (L) 01/28/2018   MCV 78.7 (L) 01/28/2018   PLT 796 (H) 01/28/2018    Lab Results  Component Value Date   CREATININE 0.83 01/29/2018   BUN 11 01/28/2018   NA 129 (L) 01/28/2018   K 3.7 01/28/2018   CL 93 (L)  01/28/2018   CO2 25 01/28/2018    Lab Results  Component Value Date   ALT 15 01/17/2018   AST 19 01/17/2018   ALKPHOS 152 (H) 01/17/2018   BILITOT 0.7 01/17/2018     Microbiology: Recent Results (from the past 240 hour(s))  Culture, blood (Routine X 2) w Reflex to ID Panel     Status: None   Collection Time: 01/19/18 11:37 AM  Result Value Ref Range Status   Specimen Description BLOOD LEFT HAND  Final   Special Requests   Final    BOTTLES DRAWN AEROBIC AND ANAEROBIC Blood Culture adequate volume   Culture   Final    NO GROWTH 5 DAYS Performed at [facility], [address], [place], [place] [address]    Report Status 01/24/2018 FINAL  Final  Aerobic/Anaerobic Culture (surgical/deep wound)     Status: None (Preliminary result)   Collection Time: 01/25/18 11:19 AM  Result Value Ref Range Status   Specimen Description ABSCESS  Final   Special Requests Normal  Final   Gram Stain   Final    ABUNDANT WBC PRESENT, PREDOMINANTLY PMN MODERATE GRAM POSITIVE RODS MODERATE GRAM POSITIVE COCCI FEW GRAM NEGATIVE RODS    Culture   Final    MODERATE ENTEROCOCCUS FAECALIS MODERATE BACTEROIDES THETAIOTAOMICRON BETA LACTAMASE NEGATIVE Performed at [facility], [place] [address], [place], [place] [address]    Report Status PENDING  Incomplete   Organism ID, Bacteria ENTEROCOCCUS FAECALIS  Final      Susceptibility   Enterococcus faecalis - MIC*    AMPICILLIN <=2 SENSITIVE Sensitive     VANCOMYCIN 1 SENSITIVE Sensitive     GENTAMICIN SYNERGY RESISTANT Resistant     * MODERATE ENTEROCOCCUS FAECALIS  Surgical pcr screen     Status: Abnormal   Collection Time: 01/28/18  1:15 AM  Result Value Ref Range Status   MRSA, PCR POSITIVE (A) NEGATIVE Final    Comment: RESULT CALLED TO, READ BACK BY AND VERIFIED WITH: HARGROVE,C RN 0308 01/28/18 MITCHELL,L    Staphylococcus aureus POSITIVE (A) NEGATIVE Final    Comment: (NOTE) The Xpert SA Assay (FDA approved for NASAL specimens in  patients 85 years of age and older), is one component of a comprehensive surveillance program. It is not intended to diagnose infection nor to guide or monitor treatment.      Terri Piedra, NP [facility] [phone] Pager  01/29/2018  11:32 AM

## 2018-01-30 LAB — CBC WITH DIFFERENTIAL/PLATELET
Abs Immature Granulocytes: 0.56 10*3/uL — ABNORMAL HIGH (ref 0.00–0.07)
Basophils Absolute: 0 10*3/uL (ref 0.0–0.1)
Basophils Relative: 0 %
Eosinophils Absolute: 0.2 10*3/uL (ref 0.0–0.5)
Eosinophils Relative: 1 %
HCT: 26 % — ABNORMAL LOW (ref 39.0–52.0)
HEMOGLOBIN: 7.5 g/dL — AB (ref 13.0–17.0)
Immature Granulocytes: 3 %
Lymphocytes Relative: 8 %
Lymphs Abs: 1.6 10*3/uL (ref 0.7–4.0)
MCH: 23.1 pg — ABNORMAL LOW (ref 26.0–34.0)
MCHC: 28.8 g/dL — ABNORMAL LOW (ref 30.0–36.0)
MCV: 80 fL (ref 80.0–100.0)
Monocytes Absolute: 1 10*3/uL (ref 0.1–1.0)
Monocytes Relative: 5 %
Neutro Abs: 16 10*3/uL — ABNORMAL HIGH (ref 1.7–7.7)
Neutrophils Relative %: 83 %
PLATELETS: 883 10*3/uL — AB (ref 150–400)
RBC: 3.25 MIL/uL — ABNORMAL LOW (ref 4.22–5.81)
RDW: 14.7 % (ref 11.5–15.5)
WBC: 19.3 10*3/uL — ABNORMAL HIGH (ref 4.0–10.5)
nRBC: 0 % (ref 0.0–0.2)

## 2018-01-30 LAB — COMPREHENSIVE METABOLIC PANEL
ALT: 31 U/L (ref 0–44)
AST: 38 U/L (ref 15–41)
Albumin: 1.4 g/dL — ABNORMAL LOW (ref 3.5–5.0)
Alkaline Phosphatase: 249 U/L — ABNORMAL HIGH (ref 38–126)
Anion gap: 10 (ref 5–15)
BUN: 8 mg/dL (ref 8–23)
CO2: 27 mmol/L (ref 22–32)
CREATININE: 0.69 mg/dL (ref 0.61–1.24)
Calcium: 7.3 mg/dL — ABNORMAL LOW (ref 8.9–10.3)
Chloride: 96 mmol/L — ABNORMAL LOW (ref 98–111)
GFR calc Af Amer: >60 mL/min (ref >60)
Glucose, Bld: 182 mg/dL — ABNORMAL HIGH (ref 70–99)
Potassium: 3.6 mmol/L (ref 3.5–5.1)
Sodium: 133 mmol/L — ABNORMAL LOW (ref 135–145)
Total Bilirubin: 0.3 mg/dL (ref 0.3–1.2)
Total Protein: 5.7 g/dL — ABNORMAL LOW (ref 6.5–8.1)

## 2018-01-30 LAB — GLUCOSE, CAPILLARY
Glucose-Capillary: 108 mg/dL — ABNORMAL HIGH (ref 70–99)
Glucose-Capillary: 207 mg/dL — ABNORMAL HIGH (ref 70–99)
Glucose-Capillary: 283 mg/dL — ABNORMAL HIGH (ref 70–99)
Glucose-Capillary: 203 mg/dL — ABNORMAL HIGH (ref 70–99)
Glucose-Capillary: 247 mg/dL — ABNORMAL HIGH (ref 70–99)

## 2018-01-30 NOTE — Progress Notes (Signed)
Referring Physician(s): Matthews,E  Supervising Physician: Markus Daft  Patient Status:  [facility] - In-pt  Chief Complaint: Abdominal/left thigh abscesses   Subjective: Pt feeling better today; denies worsening abd or LE pain   Allergies: Patient has no known allergies.  Medications: Prior to Admission medications   Medication Sig Start Date End Date Taking? Authorizing Provider  aspirin EC 81 MG tablet Take 81 mg by mouth daily.   Yes [provider]  LEVEMIR FLEXTOUCH 100 UNIT/ML Pen Inject 30 Units into the skin daily. 01/07/18  Yes Kathie Dike, MD  levothyroxine (SYNTHROID, LEVOTHROID) 25 MCG tablet Take 25 mcg by mouth daily before breakfast.   Yes [provider]  pravastatin (PRAVACHOL) 10 MG tablet Take 10 mg by mouth daily.   Yes [provider]     Vital Signs: BP 136/73 (BP Location: Right Arm)   Pulse (!) 105   Temp 98.4 F (36.9 C) (Oral)   Resp 18   Ht 5\' 8"  (1.727 m)   Wt 160 lb 11.5 oz (72.9 kg)   SpO2 100%   BMI 24.44 kg/m   Physical Exam awake/alert; abd drain intact, insertion site ok, output 10 cc sl hazy, light yellow fluid; left thigh drain intact, dressing dry, mildly tender, output 185 cc purulent beige fluid; cx pend; cont   Imaging: US Guided Needle Placement  Result Date: 01/29/2018 INDICATION: 84 year old male with multi compartment deep intramuscular abscesses within the left hip and thigh. EXAM: 1. Ultrasound-guided drain placement 2. Ultrasound-guided aspiration MEDICATIONS: The patient is currently admitted to the hospital and receiving intravenous antibiotics. The antibiotics were administered within an appropriate time frame prior to the initiation of the procedure. ANESTHESIA/SEDATION: Fentanyl 1 mcg IV; Versed 50 mg IV Moderate Sedation Time:  33 minutes The patient was continuously monitored during the procedure by the interventional radiology nurse under my direct supervision. COMPLICATIONS: None  immediate. PROCEDURE: Informed written consent was obtained from the patient after a thorough discussion of the procedural risks, benefits and alternatives. All questions were addressed. A timeout was performed prior to the initiation of the procedure. Ultrasound was used to interrogate the soft tissues of the left lower extremity. There is an elongated highly complex fluid collection within the musculature of the anterior compartment. Additionally, more heterogeneous and ill-defined complex fluid collections are present medial in the groin partially surrounding the femoral vasculature in the region of the adductor musculature. Multiple potential skin entry sites were selected and marked. The entire right thigh and groin were then sterilely prepped and draped in the standard fashion using chlorhexidine skin prep. Local anesthesia was attained by infiltration with 1% lidocaine. First, an attempt was made to place a drainage catheter into the large anterior compartment fluid collection. A small dermatotomy was made. Under real-time ultrasound guidance, an 18 gauge trocar needle was successfully advanced into the fluid collection. A 0.035 wire was then coiled in the fluid collection. The skin tract was dilated to 10 [place] and a [place] all-purpose drainage catheter was advanced over the wire and formed. Aspiration yields approximately 50 mL of thick, purulent fluid. The ultrasound probe was used to milk the length of the anterior compartment abscess in till all aspirated will material was successfully collected. The catheter was then flushed and connected to JP bulb suction. Attention was next turned to the left groin in the region of the adductor musculature. Again, local anesthesia was attained by infiltration with 1% lidocaine and a small dermatotomy was made. This time,  the 18 gauge needle was advanced into the more complex fluid collection under real-time sonographic guidance. The collection is too small  for percutaneous drain placement. Therefore, aspiration was performed. Approximately 10 mL of thick bloody purulent fluid was aspirated. Post aspiration ultrasound imaging was performed demonstrating significant reduction in the volume of the abscess collections. The sample of aspirated purulent fluid was sent for Gram stain and culture. IMPRESSION: 1. Successful placement of a 10 French drainage catheter into the left thigh anterior compartment abscess. 2. Technically successful aspiration of the visible portion of the adductor musculature abscesses. 3. Aspirated fluid sent for culture. Electronically Signed   By: Jacqulynn Cadet M.D.   On: 01/29/2018 17:24   US Guided Needle Placement  Result Date: 01/29/2018 INDICATION: 84 year old male with multi compartment deep intramuscular abscesses within the left hip and thigh. EXAM: 1. Ultrasound-guided drain placement 2. Ultrasound-guided aspiration MEDICATIONS: The patient is currently admitted to the hospital and receiving intravenous antibiotics. The antibiotics were administered within an appropriate time frame prior to the initiation of the procedure. ANESTHESIA/SEDATION: Fentanyl 1 mcg IV; Versed 50 mg IV Moderate Sedation Time:  33 minutes The patient was continuously monitored during the procedure by the interventional radiology nurse under my direct supervision. COMPLICATIONS: None immediate. PROCEDURE: Informed written consent was obtained from the patient after a thorough discussion of the procedural risks, benefits and alternatives. All questions were addressed. A timeout was performed prior to the initiation of the procedure. Ultrasound was used to interrogate the soft tissues of the left lower extremity. There is an elongated highly complex fluid collection within the musculature of the anterior compartment. Additionally, more heterogeneous and ill-defined complex fluid collections are present medial in the groin partially surrounding the femoral  vasculature in the region of the adductor musculature. Multiple potential skin entry sites were selected and marked. The entire right thigh and groin were then sterilely prepped and draped in the standard fashion using chlorhexidine skin prep. Local anesthesia was attained by infiltration with 1% lidocaine. First, an attempt was made to place a drainage catheter into the large anterior compartment fluid collection. A small dermatotomy was made. Under real-time ultrasound guidance, an 18 gauge trocar needle was successfully advanced into the fluid collection. A 0.035 wire was then coiled in the fluid collection. The skin tract was dilated to 10 [place] and a [place] all-purpose drainage catheter was advanced over the wire and formed. Aspiration yields approximately 50 mL of thick, purulent fluid. The ultrasound probe was used to milk the length of the anterior compartment abscess in till all aspirated will material was successfully collected. The catheter was then flushed and connected to JP bulb suction. Attention was next turned to the left groin in the region of the adductor musculature. Again, local anesthesia was attained by infiltration with 1% lidocaine and a small dermatotomy was made. This time, the 18 gauge needle was advanced into the more complex fluid collection under real-time sonographic guidance. The collection is too small for percutaneous drain placement. Therefore, aspiration was performed. Approximately 10 mL of thick bloody purulent fluid was aspirated. Post aspiration ultrasound imaging was performed demonstrating significant reduction in the volume of the abscess collections. The sample of aspirated purulent fluid was sent for Gram stain and culture. IMPRESSION: 1. Successful placement of a 10 French drainage catheter into the left thigh anterior compartment abscess. 2. Technically successful aspiration of the visible portion of the adductor musculature abscesses. 3. Aspirated fluid sent  for culture. Electronically Signed  By: Jacqulynn Cadet M.D.   On: 01/29/2018 17:24   Ct Abdomen Pelvis W Contrast  Result Date: 01/27/2018 CLINICAL DATA:  Acute, generalized abdominal pain with fever and stool incontinence. Clinical concern for possible upper thigh abscess. EXAM: CT ABDOMEN AND PELVIS WITH CONTRAST TECHNIQUE: Multidetector CT imaging of the abdomen and pelvis was performed using the standard protocol following bolus administration of intravenous contrast. CONTRAST:  139mL OMNIPAQUE IOHEXOL 300 MG/ML  SOLN COMPARISON:  01/25/2018 CT-guided drainage and 01/22/2018 abdomen and pelvis CT. FINDINGS: Lower chest: Small to moderate-sized left pleural effusion and small right pleural effusion. Bilateral lower lobe compressive atelectasis. Dense coronary artery calcifications. Borderline enlarged heart. Hepatobiliary: Small liver cyst. Unremarkable gallbladder. Pancreas: Diffusely atrophied. Spleen: Unremarkable. Adrenals/Urinary Tract: Normal appearing adrenal glands. Bilateral renal cysts. Foley catheter in the urinary bladder. Unremarkable ureters. Stomach/Bowel: Gas distended right, transverse and left colon containing stool and oral contrast. Nondistended mid and distal sigmoid colon. Mild diffuse rectal wall thickening. Vascular/Lymphatic: Atheromatous arterial calcifications without aneurysm. No enlarged lymph nodes. Reproductive: Multiple prostate radiation seed implants. Moderate to large-sized bilateral scrotal hydroceles. Other: Pigtail catheter within the previously demonstrated right rectus sheath abscess. There is some fluid and gas within the rectus sheath at and above that location, measuring 2.9 x 2.4 cm on image number 77 series 3 with mild peripheral wall enhancement. This measures approximately 15.4 cm in length. Again demonstrated are multiple fluid and gas collections in the proximal left thigh, not included in their entirety. Some of the included portions are slightly larger  and some are slightly smaller and these have some peripheral rim enhancement. Again demonstrated is fluid and gas within and surrounding the symphysis pubis with mild focal bone destruction and adjacent bony fragmentation. Diffuse subcutaneous edema. Musculoskeletal: Severe right hip degenerative changes with subarticular cyst formation. Moderate left hip degenerative changes. Lumbar and lower thoracic spine degenerative changes and mild scoliosis. IMPRESSION: 1. 2.9 x 2.4 x 15.4 cm right rectus sheath abscess containing a pigtail drainage catheter. 2. Multiple fluid and gas collections in the proximal left thigh, not included in their entirety. Some of the included portions are slightly larger and some are slightly smaller and these have some peripheral rim enhancement, compatible with multiple abscesses. 3. Persistent findings compatible with abscess formation within and surrounding the symphysis pubis with mild changes of probable osteomyelitis. 4. Mild diffuse rectal wall thickening, most likely due to proctitis. 5. Small to moderate-sized left pleural effusion and small right pleural effusion. 6. Bilateral lower lobe compressive atelectasis. 7. Dense coronary artery atheromatous calcifications. 8. Moderate to large-sized bilateral scrotal hydroceles. Electronically Signed   By: Claudie Revering M.D.   On: 01/27/2018 00:10   Mr Femur Left W Wo Contrast  Result Date: 01/29/2018 CLINICAL DATA:  Infection in the left thigh EXAM: MR OF THE LEFT LOWER EXTREMITY WITHOUT AND WITH CONTRAST TECHNIQUE: Multiplanar, multisequence MR imaging of the left thigh was performed both before and after administration of intravenous contrast. CONTRAST:  7 cc Gadavist COMPARISON:  01/26/2018 pelvic CT FINDINGS: Bones/Joint/Cartilage T2 boundary effects, the proximal-most femur including the femoral head, and the distal most femur include knee, are obscured. No abnormal marrow edema or other findings of osteomyelitis involving the  remainder of the femur. Small to moderate knee effusion with mild synovitis in the suprapatellar bursa. Ligaments N/A Muscles and Tendons Severe myositis involving the right thigh musculature to include the hip adductor musculature, quadriceps musculature, and to a lesser extent the medial and posterior compartmental musculature. 2.0 by 3.7  cm abscess in the vicinity of the left distal operator externus/quadratus femoris muscle, image 12/11. 2.2 by 4.1 by 24.1 cm (volume = 110 cm^3) abscess in the anterior compartment interposed between the rectus femoris and vastus intermedius. Extensive edema throughout the anterior compartmental musculature, with associated accentuated muscular enhancement. No obvious current muscle necrosis. Speckled gas is present within along the hip adductor musculature, distal iloipsoas abscess, and some of the anterior compartmental upper thigh abscess including the vastus intermedius and vastus medialis. Soft tissues Severe subcutaneous edema involving the perineum, scrotum, and left thigh extending down to the knee. Notable edema along the medial superficial side of the gracilis muscle. Contralateral muscular and subcutaneous edema is present, although not as severe is on the left side. Foley catheter is observed. IMPRESSION: 1. Severe myositis and likely fasciitis in the left thigh, with a long vertically oriented anterior compartmental abscess, considerable abscess formation with speckles of gas density along the hip adductor musculature, abnormal muscle edema most severely affecting the anterior compartment, and severe subcutaneous edema of the perineum, scrotum, and left thigh. A left hip effusion is present with mild synovitis, but we don't see definite signs of osteomyelitis with the understanding that the knee and hip are obscured by boundary artifact. 2. Right thigh muscular compartmental and subcutaneous edema is also present, although not as severe as on the left. Electronically  Signed   By: Van Clines M.D.   On: 01/29/2018 07:48    Labs:  CBC: Recent Labs    01/26/18 0322 01/27/18 0228 01/28/18 0159 01/30/18 0225  WBC 23.8* 28.4* 26.5* 19.3*  HGB 8.7* 8.3* 8.1* 7.5*  HCT 29.4* 28.0* 26.3* 26.0*  PLT 762* 884* 796* 883*    COAGS: Recent Labs    01/03/18 1929 01/17/18 0443 01/25/18 0348  INR 1.20 1.31 1.15  APTT  --  37*  --     BMP: Recent Labs    01/26/18 0322 01/27/18 0228 01/28/18 0159 01/29/18 0132 01/30/18 0225  NA 134* 129* 129*  --  133*  K 3.9 3.5 3.7  --  3.6  CL 97* 94* 93*  --  96*  CO2 24 25 25   --  27  GLUCOSE 140* 180* 301*  --  182*  BUN 11 10 11   --  8  CALCIUM 7.7* 7.2* 7.3*  --  7.3*  CREATININE 0.79 0.79 0.82 0.83 0.69  GFRNONAA >60 >60 >60 >60 >60  GFRAA >60 >60 >60 >60 >60    LIVER FUNCTION TESTS: Recent Labs    01/03/18 1929 01/05/18 0551 01/17/18 0237 01/30/18 0225  BILITOT 1.0 0.3 0.7 0.3  AST 26 21 19  38  ALT 19 19 15 31   ALKPHOS 132* 141* 152* 249*  PROT 7.8 7.1 7.2 5.7*  ALBUMIN 3.1* 2.6* 2.5* 1.4*    Assessment and Plan: Pt s/p drainage of right rectus sheath abscess 12/7, left thigh abscess 12/11 along with aspiration of additional adductor musculature abscesses; afebrile; WBC 19.3(26.5), hgb 7.5(8.1)- monitor closely; creat nl; cont drains/ irrigations; monitor output closely- once minimal obtain f/u CT   Electronically Signed: D. Rowe Robert, PA-C 01/30/2018, 11:05 AM   I spent a total of 15 minutes at the the patient's bedside AND on the patient's hospital floor or unit, greater than 50% of which was counseling/coordinating care for abdominal/left thigh abscess drains    Patient ID: [name], male   DOB: 26-Sep-1933, 84 y.o.   MRN: [id number]

## 2018-01-30 NOTE — Social Work (Addendum)
4:30pm- Spoke with Debbie at [facility], pt approved for SNF pending iv abx clarification and facility choice. Sent referral to pt's two preferred facilities: [facility] and [facility].  2:18pm-Authorization initiated for SNF through pt's [facility]. Await results and recommendations of cultures.   Westley Hummer, MSW, [place] [employer] [phone]

## 2018-01-30 NOTE — Progress Notes (Signed)
PROGRESS NOTE    Anthony Dunlap  WLS:[id number] DOB: June 05, 1933 DOA: 01/17/2018 PCP: [name], MD  Brief Narrative: 84 y.o.malewith medical history significant forhypothyroidism, insulin-dependent diabetes mellitus, hyperlipidemia, and history of prostate cancer with chronic Foley catheter, now presenting to the emergency department for evaluation of rigors. Patient reports that he was in his usual state of health until overnight when he began sweating and developed shaking chills. In the ED, pt was febrile with HR in 140s. He also had serum glucose of 440 with anion gap of 12. He was initially started on IVF and IV insulin. He denies any recent cough, shortness of breath, chest pain, vomiting, or diarrhea. Patient was admitted for sepsis due topresumptive UTI initially. Urine culture was not revealing. Blood cultures grew peptostreptococcus and Bacteroides thetaiodomicron. Antibiotics were adjusted  Assessment & Plan:   Principal Problem:   Enterococcus faecalis infection Active Problems:   Sepsis secondary to UTI ([place])   Hypothyroidism   DKA (diabetic ketoacidoses) ([name])   Normocytic anemia   Sepsis due to Bacteroides species ([name])   Sepsis without acute organ dysfunction ([name])   Psoas abscess ([name])   Abscess  1 sepsis secondary to bacteremia with Peptostreptococcus and bacteroids.-  CT scan of the abdomen and pelvis showing multiple intramuscular abscesses. 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  Echocardiogram 01/19/2018 no evidence of vegetation.Follow-up blood cultures negative.S/P IR ct guided drainage of right rectus abscess.CTThe over arching finding is that of numerous intramuscular abscesses as stated above along the course of the distal right rectus, about the bilateral pectineus, obturator externus and left quadratus femoris muscles as well as left ileo psoas. Small abscess arising off the pubic symphysis is also noted with subtle bone loss  and cortical bone destruction raising concern for septic arthritis and osteomyelitis of the pubic bone. Left greater than right small pleural effusions with atelectasis. Bilateral renal cysts.  CT scan repeated 01/26/2018 abdomen pelvis and upper thighs shows-2.9 x 2.4 x 15.4 cm right rectus sheath abscess containing a pigtail drainage catheter.. Multiple fluid and gas collections in the proximal left thigh, not included in their entirety. Some of the included portions are slightly larger and some are slightly smaller and these have some peripheral rim enhancement, compatible with multiple abscesses.. Persistent findings compatible with abscess formation within and surrounding the symphysis pubis with mild changes of probable osteomyelitis. Mild diffuse rectal wall thickening, most likely due proctitis. Small to moderate-sized left pleural effusion and small right pleural effusion. Bilateral lower lobe compressive atelectasis. Dense coronary artery atheromatous calcifications. Moderate to large-sized bilateral scrotal hydroceles.  MRI abd pelvis 12/10-anterior compartmental abscess left thigh.D/W dr Erlinda Hong recommends IR intervention.reconsulted IR.  ID following.leukocytosis improved .SECOND drain place left thigh abscess 01/29/18.continue Unasyn.follow up final cultures.  2]type 2 dm with hypoglycemia-decrease levemir  3]hypothyroidsm-continue synthroid  4]prostate cancer -has chronic foley     Estimated body mass index is 24.44 kg/m as calculated from the following:   Height as of this encounter: 5\' 8"  (1.727 m).   Weight as of this encounter: 72.9 kg.  DVT prophylaxis: lovenox Family Communication: none today Disposition Plan: will need SNF And iv antibiotics on dc   Consultants:   Id orhto  Procedures: drains   Antimicrobials: unasyn   Subjective: Feels better after second drain drinking mountain dew  Objective: Vitals:   01/29/18 1520 01/29/18 1549  01/29/18 2051 01/30/18 0439  BP: 126/66 131/87 114/66 136/73  Pulse: 97 (!) 103 (!) 105 (!) 105  Resp: (!) 22  (!)  21 18  Temp:  99.2 F (37.3 C) 98.8 F (37.1 C) 98.4 F (36.9 C)  TempSrc:  Oral Oral Oral  SpO2: 96% 90% 96% 100%  Weight:      Height:        Intake/Output Summary (Last 24 hours) at 01/30/2018 1250 Last data filed at 01/30/2018 0930 Gross per 24 hour  Intake 1332.61 ml  Output 795 ml  Net 537.61 ml   Filed Weights   01/17/18 0209 01/17/18 0600 01/23/18 2337  Weight: 70.3 kg 67.1 kg 72.9 kg    Examination:  General exam: Appears calm and comfortable  Respiratory system: Clear to auscultation. Respiratory effort normal. Cardiovascular system: S1 & S2 heard, RRR. No JVD, murmurs, rubs, gallops or clicks. No pedal edema. Gastrointestinal system: Abdomen is nondistended, soft and nontender. No organomegaly or masses felt. Normal bowel sounds heard. Central nervous system: Alert and oriented. No focal neurological deficits. Extremities: Symmetric 5 x 5 power. Skin: No rashes, lesions or ulcers Psychiatry: Judgement and insight appear normal. Mood & affect appropriate.     Data Reviewed: I have personally reviewed following labs and imaging studies  CBC: Recent Labs  Lab 01/25/18 0348 01/26/18 0322 01/27/18 0228 01/28/18 0159 01/30/18 0225  WBC 23.2* 23.8* 28.4* 26.5* 19.3*  NEUTROABS 19.6*  --   --   --  16.0*  HGB 7.9* 8.7* 8.3* 8.1* 7.5*  HCT 25.7* 29.4* 28.0* 26.3* 26.0*  MCV 77.6* 80.3 79.3* 78.7* 80.0  PLT 678* 762* 884* 796* 503*   Basic Metabolic Panel: Recent Labs  Lab 01/25/18 0348 01/26/18 0322 01/27/18 0228 01/28/18 0159 01/29/18 0132 01/30/18 0225  NA 130* 134* 129* 129*  --  133*  K 2.7* 3.9 3.5 3.7  --  3.6  CL 94* 97* 94* 93*  --  96*  CO2 24 24 25 25   --  27  GLUCOSE 186* 140* 180* 301*  --  182*  BUN 12 11 10 11   --  8  CREATININE 0.96 0.79 0.79 0.82 0.83 0.69  CALCIUM 7.4* 7.7* 7.2* 7.3*  --  7.3*   GFR: Estimated  Creatinine Clearance: 68.9 mL/min (by C-G formula based on SCr of 0.69 mg/dL). Liver Function Tests: Recent Labs  Lab 01/30/18 0225  AST 38  ALT 31  ALKPHOS 249*  BILITOT 0.3  PROT 5.7*  ALBUMIN 1.4*   No results for input(s): LIPASE, AMYLASE in the last 168 hours. No results for input(s): AMMONIA in the last 168 hours. Coagulation Profile: Recent Labs  Lab 01/25/18 0348  INR 1.15   Cardiac Enzymes: No results for input(s): CKTOTAL, CKMB, CKMBINDEX, TROPONINI in the last 168 hours. BNP (last 3 results) No results for input(s): PROBNP in the last 8760 hours. HbA1C: No results for input(s): HGBA1C in the last 72 hours. CBG: Recent Labs  Lab 01/29/18 1255 01/29/18 1736 01/29/18 2048 01/30/18 0818 01/30/18 1214  GLUCAP 161* 74 197* 108* 207*   Lipid Profile: No results for input(s): CHOL, HDL, LDLCALC, TRIG, CHOLHDL, LDLDIRECT in the last 72 hours. Thyroid Function Tests: No results for input(s): TSH, T4TOTAL, FREET4, T3FREE, THYROIDAB in the last 72 hours. Anemia Panel: No results for input(s): VITAMINB12, FOLATE, FERRITIN, TIBC, IRON, RETICCTPCT in the last 72 hours. Sepsis Labs: No results for input(s): PROCALCITON, LATICACIDVEN in the last 168 hours.  Recent Results (from the past 240 hour(s))  Aerobic/Anaerobic Culture (surgical/deep wound)     Status: None (Preliminary result)   Collection Time: 01/25/18 11:19 AM  Result Value Ref Range  Status   Specimen Description ABSCESS  Final   Special Requests Normal  Final   Gram Stain   Final    ABUNDANT WBC PRESENT, PREDOMINANTLY PMN MODERATE GRAM POSITIVE RODS MODERATE GRAM POSITIVE COCCI FEW GRAM NEGATIVE RODS    Culture   Final    MODERATE ENTEROCOCCUS FAECALIS MODERATE BACTEROIDES THETAIOTAOMICRON BETA LACTAMASE NEGATIVE Performed at [facility], [address], [place], [place] [address]    Report Status PENDING  Incomplete   Organism ID, Bacteria ENTEROCOCCUS FAECALIS  Final      Susceptibility     Enterococcus faecalis - MIC*    AMPICILLIN <=2 SENSITIVE Sensitive     VANCOMYCIN 1 SENSITIVE Sensitive     GENTAMICIN SYNERGY RESISTANT Resistant     * MODERATE ENTEROCOCCUS FAECALIS  Surgical pcr screen     Status: Abnormal   Collection Time: 01/28/18  1:15 AM  Result Value Ref Range Status   MRSA, PCR POSITIVE (A) NEGATIVE Final    Comment: RESULT CALLED TO, READ BACK BY AND VERIFIED WITH: HARGROVE,C RN 0308 01/28/18 MITCHELL,L    Staphylococcus aureus POSITIVE (A) NEGATIVE Final    Comment: (NOTE) The Xpert SA Assay (FDA approved for NASAL specimens in patients 81 years of age and older), is one component of a comprehensive surveillance program. It is not intended to diagnose infection nor to guide or monitor treatment.   Aerobic/Anaerobic Culture (surgical/deep wound)     Status: None (Preliminary result)   Collection Time: 01/29/18  3:27 PM  Result Value Ref Range Status   Specimen Description ABSCESS LEFT THIGH  Final   Special Requests NONE  Final   Gram Stain   Final    ABUNDANT WBC PRESENT, PREDOMINANTLY PMN FEW GRAM POSITIVE COCCI RARE GRAM POSITIVE RODS    Culture   Final    CULTURE REINCUBATED FOR BETTER GROWTH Performed at [facility], [place] [address] [place], [place] [address]    Report Status PENDING  Incomplete         Radiology Studies: US Guided Needle Placement  Result Date: 01/29/2018 INDICATION: 84 year old male with multi compartment deep intramuscular abscesses within the left hip and thigh. EXAM: 1. Ultrasound-guided drain placement 2. Ultrasound-guided aspiration MEDICATIONS: The patient is currently admitted to the hospital and receiving intravenous antibiotics. The antibiotics were administered within an appropriate time frame prior to the initiation of the procedure. ANESTHESIA/SEDATION: Fentanyl 1 mcg IV; Versed 50 mg IV Moderate Sedation Time:  33 minutes The patient was continuously monitored during the procedure by the  interventional radiology nurse under my direct supervision. COMPLICATIONS: None immediate. PROCEDURE: Informed written consent was obtained from the patient after a thorough discussion of the procedural risks, benefits and alternatives. All questions were addressed. A timeout was performed prior to the initiation of the procedure. Ultrasound was used to interrogate the soft tissues of the left lower extremity. There is an elongated highly complex fluid collection within the musculature of the anterior compartment. Additionally, more heterogeneous and ill-defined complex fluid collections are present medial in the groin partially surrounding the femoral vasculature in the region of the adductor musculature. Multiple potential skin entry sites were selected and marked. The entire right thigh and groin were then sterilely prepped and draped in the standard fashion using chlorhexidine skin prep. Local anesthesia was attained by infiltration with 1% lidocaine. First, an attempt was made to place a drainage catheter into the large anterior compartment fluid collection. A small dermatotomy was made. Under real-time ultrasound guidance,  an 18 gauge trocar needle was successfully advanced into the fluid collection. A 0.035 wire was then coiled in the fluid collection. The skin tract was dilated to 10 [place] and a [place] all-purpose drainage catheter was advanced over the wire and formed. Aspiration yields approximately 50 mL of thick, purulent fluid. The ultrasound probe was used to milk the length of the anterior compartment abscess in till all aspirated will material was successfully collected. The catheter was then flushed and connected to JP bulb suction. Attention was next turned to the left groin in the region of the adductor musculature. Again, local anesthesia was attained by infiltration with 1% lidocaine and a small dermatotomy was made. This time, the 18 gauge needle was advanced into the more complex  fluid collection under real-time sonographic guidance. The collection is too small for percutaneous drain placement. Therefore, aspiration was performed. Approximately 10 mL of thick bloody purulent fluid was aspirated. Post aspiration ultrasound imaging was performed demonstrating significant reduction in the volume of the abscess collections. The sample of aspirated purulent fluid was sent for Gram stain and culture. IMPRESSION: 1. Successful placement of a 10 French drainage catheter into the left thigh anterior compartment abscess. 2. Technically successful aspiration of the visible portion of the adductor musculature abscesses. 3. Aspirated fluid sent for culture. Electronically Signed   By: Jacqulynn Cadet M.D.   On: 01/29/2018 17:24   US Guided Needle Placement  Result Date: 01/29/2018 INDICATION: 84 year old male with multi compartment deep intramuscular abscesses within the left hip and thigh. EXAM: 1. Ultrasound-guided drain placement 2. Ultrasound-guided aspiration MEDICATIONS: The patient is currently admitted to the hospital and receiving intravenous antibiotics. The antibiotics were administered within an appropriate time frame prior to the initiation of the procedure. ANESTHESIA/SEDATION: Fentanyl 1 mcg IV; Versed 50 mg IV Moderate Sedation Time:  33 minutes The patient was continuously monitored during the procedure by the interventional radiology nurse under my direct supervision. COMPLICATIONS: None immediate. PROCEDURE: Informed written consent was obtained from the patient after a thorough discussion of the procedural risks, benefits and alternatives. All questions were addressed. A timeout was performed prior to the initiation of the procedure. Ultrasound was used to interrogate the soft tissues of the left lower extremity. There is an elongated highly complex fluid collection within the musculature of the anterior compartment. Additionally, more heterogeneous and ill-defined complex  fluid collections are present medial in the groin partially surrounding the femoral vasculature in the region of the adductor musculature. Multiple potential skin entry sites were selected and marked. The entire right thigh and groin were then sterilely prepped and draped in the standard fashion using chlorhexidine skin prep. Local anesthesia was attained by infiltration with 1% lidocaine. First, an attempt was made to place a drainage catheter into the large anterior compartment fluid collection. A small dermatotomy was made. Under real-time ultrasound guidance, an 18 gauge trocar needle was successfully advanced into the fluid collection. A 0.035 wire was then coiled in the fluid collection. The skin tract was dilated to 10 [place] and a [place] all-purpose drainage catheter was advanced over the wire and formed. Aspiration yields approximately 50 mL of thick, purulent fluid. The ultrasound probe was used to milk the length of the anterior compartment abscess in till all aspirated will material was successfully collected. The catheter was then flushed and connected to JP bulb suction. Attention was next turned to the left groin in the region of the adductor musculature. Again, local anesthesia was attained by  infiltration with 1% lidocaine and a small dermatotomy was made. This time, the 18 gauge needle was advanced into the more complex fluid collection under real-time sonographic guidance. The collection is too small for percutaneous drain placement. Therefore, aspiration was performed. Approximately 10 mL of thick bloody purulent fluid was aspirated. Post aspiration ultrasound imaging was performed demonstrating significant reduction in the volume of the abscess collections. The sample of aspirated purulent fluid was sent for Gram stain and culture. IMPRESSION: 1. Successful placement of a 10 French drainage catheter into the left thigh anterior compartment abscess. 2. Technically successful aspiration of  the visible portion of the adductor musculature abscesses. 3. Aspirated fluid sent for culture. Electronically Signed   By: Jacqulynn Cadet M.D.   On: 01/29/2018 17:24   Mr Femur Left W Wo Contrast  Result Date: 01/29/2018 CLINICAL DATA:  Infection in the left thigh EXAM: MR OF THE LEFT LOWER EXTREMITY WITHOUT AND WITH CONTRAST TECHNIQUE: Multiplanar, multisequence MR imaging of the left thigh was performed both before and after administration of intravenous contrast. CONTRAST:  7 cc Gadavist COMPARISON:  01/26/2018 pelvic CT FINDINGS: Bones/Joint/Cartilage T2 boundary effects, the proximal-most femur including the femoral head, and the distal most femur include knee, are obscured. No abnormal marrow edema or other findings of osteomyelitis involving the remainder of the femur. Small to moderate knee effusion with mild synovitis in the suprapatellar bursa. Ligaments N/A Muscles and Tendons Severe myositis involving the right thigh musculature to include the hip adductor musculature, quadriceps musculature, and to a lesser extent the medial and posterior compartmental musculature. 2.0 by 3.7 cm abscess in the vicinity of the left distal operator externus/quadratus femoris muscle, image 12/11. 2.2 by 4.1 by 24.1 cm (volume = 110 cm^3) abscess in the anterior compartment interposed between the rectus femoris and vastus intermedius. Extensive edema throughout the anterior compartmental musculature, with associated accentuated muscular enhancement. No obvious current muscle necrosis. Speckled gas is present within along the hip adductor musculature, distal iloipsoas abscess, and some of the anterior compartmental upper thigh abscess including the vastus intermedius and vastus medialis. Soft tissues Severe subcutaneous edema involving the perineum, scrotum, and left thigh extending down to the knee. Notable edema along the medial superficial side of the gracilis muscle. Contralateral muscular and subcutaneous edema  is present, although not as severe is on the left side. Foley catheter is observed. IMPRESSION: 1. Severe myositis and likely fasciitis in the left thigh, with a long vertically oriented anterior compartmental abscess, considerable abscess formation with speckles of gas density along the hip adductor musculature, abnormal muscle edema most severely affecting the anterior compartment, and severe subcutaneous edema of the perineum, scrotum, and left thigh. A left hip effusion is present with mild synovitis, but we don't see definite signs of osteomyelitis with the understanding that the knee and hip are obscured by boundary artifact. 2. Right thigh muscular compartmental and subcutaneous edema is also present, although not as severe as on the left. Electronically Signed   By: Van Clines M.D.   On: 01/29/2018 07:48        Scheduled Meds: . aspirin EC  81 mg Oral Daily  . Chlorhexidine Gluconate Cloth  6 each Topical Q0600  . enoxaparin (LOVENOX) injection  40 mg Subcutaneous Q24H  . feeding supplement (GLUCERNA SHAKE)  237 mL Oral BID BM  . insulin aspart  0-9 Units Subcutaneous TID WC  . insulin detemir  10 Units Subcutaneous QHS  . levothyroxine  25 mcg Oral Q0600  . mupirocin  ointment  1 application Nasal BID  . povidone-iodine  2 application Topical Once  . pravastatin  10 mg Oral q1800  . sodium chloride flush  5 mL Intracatheter Q8H  . sodium chloride flush  5 mL Intracatheter Q8H   Continuous Infusions: . sodium chloride Stopped (01/25/18 0105)  . sodium chloride Stopped (01/29/18 1612)  . ampicillin-sulbactam (UNASYN) IV 3 g (01/30/18 1014)     LOS: 13 days     Georgette Shell, MD [facility]  If 7PM-7AM, please contact night-coverage [email or web address] Password [email or web address] 01/30/2018, 12:50 PM

## 2018-01-30 NOTE — Progress Notes (Signed)
Subjective: No new complaints   Antibiotics:  Anti-infectives (From admission, onward)   Start     Dose/Rate Route Frequency Ordered Stop   01/25/18 1700  Ampicillin-Sulbactam (UNASYN) 3 g in sodium chloride 0.9 % 100 mL IVPB     3 g 200 mL/hr over 30 Minutes Intravenous Every 6 hours 01/25/18 1604     01/23/18 2200  vancomycin (VANCOCIN) IVPB 1000 mg/200 mL premix  Status:  Discontinued     1,000 mg 200 mL/hr over 60 Minutes Intravenous Every 24 hours 01/23/18 0817 01/24/18 1002   01/23/18 0100  piperacillin-tazobactam (ZOSYN) IVPB 3.375 g  Status:  Discontinued     3.375 g 12.5 mL/hr over 240 Minutes Intravenous Every 8 hours 01/23/18 0057 01/25/18 1549   01/23/18 0100  vancomycin (VANCOCIN) IVPB 1000 mg/200 mL premix     1,000 mg 200 mL/hr over 60 Minutes Intravenous  Once 01/23/18 0057 01/23/18 0214   01/22/18 1630  meropenem (MERREM) 1 g in sodium chloride 0.9 % 100 mL IVPB  Status:  Discontinued     1 g 200 mL/hr over 30 Minutes Intravenous Every 8 hours 01/22/18 1625 01/22/18 1626   01/22/18 1630  metroNIDAZOLE (FLAGYL) IVPB 500 mg  Status:  Discontinued     500 mg 100 mL/hr over 60 Minutes Intravenous Every 8 hours 01/22/18 1626 01/23/18 0028   01/21/18 1200  penicillin G 3 million units in sodium chloride 0.9% 100 mL IVPB  Status:  Discontinued     3 Million Units 200 mL/hr over 30 Minutes Intravenous Every 4 hours 01/21/18 1050 01/21/18 1100   01/21/18 1200  penicillin G potassium 3 Million Units in dextrose 33mL IVPB  Status:  Discontinued     3 Million Units 100 mL/hr over 30 Minutes Intravenous Every 4 hours 01/21/18 1100 01/21/18 1106   01/21/18 1200  penicillin G 3 million units in sodium chloride 0.9% 100 mL IVPB  Status:  Discontinued     3 Million Units 200 mL/hr over 30 Minutes Intravenous Every 4 hours 01/21/18 1106 01/22/18 1625   01/19/18 1400  ceFAZolin (ANCEF) IVPB 1 g/50 mL premix  Status:  Discontinued     1 g 100 mL/hr over 30 Minutes  Intravenous Every 8 hours 01/19/18 1104 01/21/18 1050   01/17/18 1200  ceFEPIme (MAXIPIME) 1 g in sodium chloride 0.9 % 100 mL IVPB  Status:  Discontinued     1 g 200 mL/hr over 30 Minutes Intravenous Every 12 hours 01/17/18 0741 01/19/18 1104   01/17/18 0230  ceFEPIme (MAXIPIME) 2 g in sodium chloride 0.9 % 100 mL IVPB     2 g 200 mL/hr over 30 Minutes Intravenous  Once 01/17/18 0224 01/17/18 0400      Medications: Scheduled Meds: . aspirin EC  81 mg Oral Daily  . Chlorhexidine Gluconate Cloth  6 each Topical Q0600  . enoxaparin (LOVENOX) injection  40 mg Subcutaneous Q24H  . feeding supplement (GLUCERNA SHAKE)  237 mL Oral BID BM  . insulin aspart  0-9 Units Subcutaneous TID WC  . insulin detemir  10 Units Subcutaneous QHS  . levothyroxine  25 mcg Oral Q0600  . mupirocin ointment  1 application Nasal BID  . povidone-iodine  2 application Topical Once  . pravastatin  10 mg Oral q1800  . sodium chloride flush  5 mL Intracatheter Q8H  . sodium chloride flush  5 mL Intracatheter Q8H   Continuous Infusions: . sodium chloride Stopped (01/25/18 0105)  .  sodium chloride Stopped (01/29/18 1612)  . ampicillin-sulbactam (UNASYN) IV 3 g (01/30/18 1014)   PRN Meds:.sodium chloride, acetaminophen, ibuprofen, ondansetron (ZOFRAN) IV    Objective: Weight change:   Intake/Output Summary (Last 24 hours) at 01/30/2018 1129 Last data filed at 01/30/2018 0930 Gross per 24 hour  Intake 1332.61 ml  Output 1345 ml  Net -12.39 ml   Blood pressure 136/73, pulse (!) 105, temperature 98.4 F (36.9 C), temperature source Oral, resp. rate 18, height 5\' 8"  (1.727 m), weight 72.9 kg, SpO2 100 %. Temp:  [98.4 F (36.9 C)-99.2 F (37.3 C)] 98.4 F (36.9 C) (12/12 0439) Pulse Rate:  [96-105] 105 (12/12 0439) Resp:  [18-23] 18 (12/12 0439) BP: (114-142)/(59-87) 136/73 (12/12 0439) SpO2:  [90 %-100 %] 100 % (12/12 0439)  Physical Exam: General: Alert and awake,  not in any acute  distress. HEENT: anicteric sclera, EOMI CVS regular rate, normal  Chest: , no wheezing, no respiratory distress Abdomen: In place on right side also drain in left thigh with purulent material Skin: no rashes Neuro: nonfocal  CBC:    BMET Recent Labs    01/28/18 0159 01/29/18 0132 01/30/18 0225  NA 129*  --  133*  K 3.7  --  3.6  CL 93*  --  96*  CO2 25  --  27  GLUCOSE 301*  --  182*  BUN 11  --  8  CREATININE 0.82 0.83 0.69  CALCIUM 7.3*  --  7.3*     Liver Panel  Recent Labs    01/30/18 0225  PROT 5.7*  ALBUMIN 1.4*  AST 38  ALT 31  ALKPHOS 249*  BILITOT 0.3       Sedimentation Rate No results for input(s): ESRSEDRATE in the last 72 hours. C-Reactive Protein No results for input(s): CRP in the last 72 hours.  Micro Results: Recent Results (from the past 720 hour(s))  Culture, blood (Routine x 2)     Status: None   Collection Time: 01/03/18  7:30 PM  Result Value Ref Range Status   Specimen Description RIGHT ANTECUBITAL  Final   Special Requests   Final    BOTTLES DRAWN AEROBIC AND ANAEROBIC Blood Culture adequate volume   Culture   Final    NO GROWTH 5 DAYS Performed at [facility], [address], [place], [place] [address]    Report Status 01/08/2018 FINAL  Final  Culture, blood (Routine x 2)     Status: Abnormal   Collection Time: 01/03/18  7:30 PM  Result Value Ref Range Status   Specimen Description   Final    LEFT ANTECUBITAL Performed at [facility], [address], [place], [place] [address]    Special Requests   Final    BOTTLES DRAWN AEROBIC AND ANAEROBIC Blood Culture adequate volume Performed at [facility], [address] [place], [place] [address]    Culture  Setup Time   Final    GRAM POSITIVE COCCI Gram Stain Report Called to,Read Back By and Verified With: MAYS,J. AT 1606 ON 01/04/2018 BY EVA ANAEROBIC BOTTLE ONLY Performed at [place] TO, READ BACK  BY AND VERIFIED WITHMarijo Conception RN [phone] BY GF GRAM POSITIVE COCCI AEROBIC Gram Stain Report Called to,Read Back By and Verified With: TAYLOR M. AT 1250 ON [id number] BY THOMPSON S. Performed at [facility], [address], [place], [place] [address]    Culture (A)  Final  STAPHYLOCOCCUS SPECIES (COAGULASE NEGATIVE) THE SIGNIFICANCE OF ISOLATING THIS ORGANISM FROM A SINGLE SET OF BLOOD CULTURES WHEN MULTIPLE SETS ARE DRAWN IS UNCERTAIN. PLEASE NOTIFY THE MICROBIOLOGY DEPARTMENT WITHIN ONE WEEK IF SPECIATION AND SENSITIVITIES ARE REQUIRED. Performed at [facility], [place] [address], [place], [place] [address]    Report Status 01/06/2018 FINAL  Final  Blood Culture ID Panel (Reflexed)     Status: Abnormal   Collection Time: 01/03/18  7:30 PM  Result Value Ref Range Status   Enterococcus species NOT DETECTED NOT DETECTED Final   Listeria monocytogenes NOT DETECTED NOT DETECTED Final   Staphylococcus species DETECTED (A) NOT DETECTED Final    Comment: Methicillin (oxacillin) susceptible coagulase negative staphylococcus. Possible blood culture contaminant (unless isolated from more than one blood culture draw or clinical case suggests pathogenicity). No antibiotic treatment is indicated for blood  culture contaminants. CRITICAL RESULT CALLED TO, READ BACK BY AND VERIFIED WITHMarijo Conception RN [phone] BY GF    Staphylococcus aureus (BCID) NOT DETECTED NOT DETECTED Final   Methicillin resistance NOT DETECTED NOT DETECTED Final   Streptococcus species NOT DETECTED NOT DETECTED Final   Streptococcus agalactiae NOT DETECTED NOT DETECTED Final   Streptococcus pneumoniae NOT DETECTED NOT DETECTED Final   Streptococcus pyogenes NOT DETECTED NOT DETECTED Final   Acinetobacter baumannii NOT DETECTED NOT DETECTED Final   Enterobacteriaceae species NOT DETECTED NOT DETECTED Final   Enterobacter cloacae complex NOT DETECTED NOT DETECTED Final   Escherichia coli NOT DETECTED NOT DETECTED Final    Klebsiella oxytoca NOT DETECTED NOT DETECTED Final   Klebsiella pneumoniae NOT DETECTED NOT DETECTED Final   Proteus species NOT DETECTED NOT DETECTED Final   Serratia marcescens NOT DETECTED NOT DETECTED Final   Haemophilus influenzae NOT DETECTED NOT DETECTED Final   Neisseria meningitidis NOT DETECTED NOT DETECTED Final   Pseudomonas aeruginosa NOT DETECTED NOT DETECTED Final   Candida albicans NOT DETECTED NOT DETECTED Final   Candida glabrata NOT DETECTED NOT DETECTED Final   Candida krusei NOT DETECTED NOT DETECTED Final   Candida parapsilosis NOT DETECTED NOT DETECTED Final   Candida tropicalis NOT DETECTED NOT DETECTED Final    Comment: Performed at [facility], [address], [place], [place] [address]  Urine Culture     Status: None   Collection Time: 01/03/18  8:40 PM  Result Value Ref Range Status   Specimen Description   Final    URINE, CLEAN CATCH Performed at [facility], [address] [place], [place] [address]    Special Requests   Final    NONE Performed at [facility], [address] [place], [place] [address]    Culture   Final    Multiple bacterial morphotypes present, none predominant. Suggest appropriate recollection if clinically indicated.   Report Status 01/05/2018 FINAL  Final  Urine culture     Status: Abnormal   Collection Time: 01/17/18  2:23 AM  Result Value Ref Range Status   Specimen Description   Final    URINE, CATHETERIZED Performed at [facility], [address] [place], [place] [address]    Special Requests   Final    Normal Performed at [facility], [address] [place], [place] [address]    Culture MULTIPLE SPECIES PRESENT, SUGGEST RECOLLECTION (A)  Final   Report Status 01/18/2018 FINAL  Final  Blood Culture (routine x 2)     Status: Abnormal   Collection Time: 01/17/18  2:40 AM  Result Value Ref  Range Status   Specimen Description   Final    BLOOD RIGHT FOREARM Performed at [facility], [address],  [place], [place] [address]    Special Requests   Final    BOTTLES DRAWN AEROBIC AND ANAEROBIC Blood Culture results may not be optimal due to an excessive volume of blood received in culture bottles Performed at [place]., [place], [place] [address]    Culture  Setup Time   Final    GRAM POSITIVE COCCI ANAEROBIC BOTTLE Gram Stain Report Called to,Read Back By and Verified With: HYATT,L@1344  BY MATTHEWS,B 11.30.19 [facility] Organism ID to follow CRITICAL RESULT CALLED TO, READ BACK BY AND VERIFIED WITH: Cletis Media RN [id number] BY GF    Culture (A)  Final    PEPTOSTREPTOCOCCUS ASACCHAROLYTICUS BACTEROIDES THETAIOTAOMICRON BETA LACTAMASE POSITIVE Performed at [facility], [address] [place], [place] [address]    Report Status 01/22/2018 FINAL  Final  Blood Culture ID Panel (Reflexed)     Status: None   Collection Time: 01/17/18  2:40 AM  Result Value Ref Range Status   Enterococcus species NOT DETECTED NOT DETECTED Final   Listeria monocytogenes NOT DETECTED NOT DETECTED Final   Staphylococcus species NOT DETECTED NOT DETECTED Final   Staphylococcus aureus (BCID) NOT DETECTED NOT DETECTED Final   Streptococcus species NOT DETECTED NOT DETECTED Final   Streptococcus agalactiae NOT DETECTED NOT DETECTED Final   Streptococcus pneumoniae NOT DETECTED NOT DETECTED Final   Streptococcus pyogenes NOT DETECTED NOT DETECTED Final   Acinetobacter baumannii NOT DETECTED NOT DETECTED Final   Enterobacteriaceae species NOT DETECTED NOT DETECTED Final   Enterobacter cloacae complex NOT DETECTED NOT DETECTED Final   Escherichia coli NOT DETECTED NOT DETECTED Final   Klebsiella oxytoca NOT DETECTED NOT DETECTED Final   Klebsiella pneumoniae NOT DETECTED NOT DETECTED Final   Proteus species NOT DETECTED NOT DETECTED Final   Serratia marcescens NOT DETECTED NOT DETECTED Final   Haemophilus influenzae NOT DETECTED NOT DETECTED Final   Neisseria meningitidis NOT  DETECTED NOT DETECTED Final   Pseudomonas aeruginosa NOT DETECTED NOT DETECTED Final   Candida albicans NOT DETECTED NOT DETECTED Final   Candida glabrata NOT DETECTED NOT DETECTED Final   Candida krusei NOT DETECTED NOT DETECTED Final   Candida parapsilosis NOT DETECTED NOT DETECTED Final   Candida tropicalis NOT DETECTED NOT DETECTED Final    Comment: Performed at [facility], [place] [address] [place], [place] [address]  Blood Culture (routine x 2)     Status: Abnormal   Collection Time: 01/17/18  2:59 AM  Result Value Ref Range Status   Specimen Description   Final    BLOOD LEFT ANTECUBITAL Performed at [facility], [address], [place], [place] [address]    Special Requests   Final    BOTTLES DRAWN AEROBIC AND ANAEROBIC Blood Culture adequate volume Performed at [facility], [address], [place], [place] [address]    Culture  Setup Time   Final    GRAM POSITIVE COCCI Gram Stain Report Called to,Read Back By and Verified With: MOTLEY @ 2202 ON [id number] BY HENDERSON L. Performed at [facility], [address] [place], [place] [address]    Culture (A)  Final    PEPTOSTREPTOCOCCUS ASACCHAROLYTICUS ANAEROBIC GRAM NEGATIVE ROD BETA LACTAMASE POSITIVE Performed at [facility], [place] [address], [place], [place] [address]    Report Status 01/22/2018 FINAL  Final  MRSA PCR Screening  Status: Abnormal   Collection Time: 01/17/18  5:56 AM  Result Value Ref Range Status   MRSA by PCR POSITIVE (A) NEGATIVE Final    Comment:        The GeneXpert MRSA Assay (FDA approved for NASAL specimens only), is one component of a comprehensive MRSA colonization surveillance program. It is not intended to diagnose MRSA infection nor to guide or monitor treatment for MRSA infections. RESULT CALLED TO, READ BACK BY AND VERIFIED WITH: EVANS,H @ 2355 ON 01/17/18 BY JUW Performed at [facility], [address] [place], [place] [address]   Culture, Urine     Status: None    Collection Time: 01/19/18 11:04 AM  Result Value Ref Range Status   Specimen Description   Final    URINE, CLEAN CATCH Performed at [facility], [address], [place], [place] [address]    Special Requests   Final    NONE Performed at [facility], [address], [place], [place] [address]    Culture   Final    Multiple bacterial morphotypes present, none predominant. Suggest appropriate recollection if clinically indicated.   Report Status 01/20/2018 FINAL  Final  Culture, blood (Routine X 2) w Reflex to ID Panel     Status: None   Collection Time: 01/19/18 11:26 AM  Result Value Ref Range Status   Specimen Description RIGHT ANTECUBITAL  Final   Special Requests   Final    BOTTLES DRAWN AEROBIC AND ANAEROBIC Blood Culture adequate volume   Culture   Final    NO GROWTH 5 DAYS Performed at [facility], [address] [place], [place] [address]    Report Status 01/24/2018 FINAL  Final  Culture, blood (Routine X 2) w Reflex to ID Panel     Status: None   Collection Time: 01/19/18 11:37 AM  Result Value Ref Range Status   Specimen Description BLOOD LEFT HAND  Final   Special Requests   Final    BOTTLES DRAWN AEROBIC AND ANAEROBIC Blood Culture adequate volume   Culture   Final    NO GROWTH 5 DAYS Performed at [facility], [address] [place], [place] [address]    Report Status 01/24/2018 FINAL  Final  Aerobic/Anaerobic Culture (surgical/deep wound)     Status: None (Preliminary result)   Collection Time: 01/25/18 11:19 AM  Result Value Ref Range Status   Specimen Description ABSCESS  Final   Special Requests Normal  Final   Gram Stain   Final    ABUNDANT WBC PRESENT, PREDOMINANTLY PMN MODERATE GRAM POSITIVE RODS MODERATE GRAM POSITIVE COCCI FEW GRAM NEGATIVE RODS    Culture   Final    MODERATE ENTEROCOCCUS FAECALIS MODERATE BACTEROIDES THETAIOTAOMICRON BETA LACTAMASE NEGATIVE Performed at [facility], [place] [address], [place],  [address]    Report  Status PENDING  Incomplete   Organism ID, Bacteria ENTEROCOCCUS FAECALIS  Final      Susceptibility   Enterococcus faecalis - MIC*    AMPICILLIN <=2 SENSITIVE Sensitive     VANCOMYCIN 1 SENSITIVE Sensitive     GENTAMICIN SYNERGY RESISTANT Resistant     * MODERATE ENTEROCOCCUS FAECALIS  Surgical pcr screen     Status: Abnormal   Collection Time: 01/28/18  1:15 AM  Result Value Ref Range Status   MRSA, PCR POSITIVE (A) NEGATIVE Final    Comment: RESULT CALLED TO, READ BACK BY AND VERIFIED WITH: HARGROVE,C RN 0308 01/28/18 MITCHELL,L    Staphylococcus aureus POSITIVE (A) NEGATIVE  Final    Comment: (NOTE) The Xpert SA Assay (FDA approved for NASAL specimens in patients 56 years of age and older), is one component of a comprehensive surveillance program. It is not intended to diagnose infection nor to guide or monitor treatment.   Aerobic/Anaerobic Culture (surgical/deep wound)     Status: None (Preliminary result)   Collection Time: 01/29/18  3:27 PM  Result Value Ref Range Status   Specimen Description ABSCESS LEFT THIGH  Final   Special Requests NONE  Final   Gram Stain   Final    ABUNDANT WBC PRESENT, PREDOMINANTLY PMN FEW GRAM POSITIVE COCCI RARE GRAM POSITIVE RODS    Culture   Final    CULTURE REINCUBATED FOR BETTER GROWTH Performed at [facility], [place] [address] [place], [place] [address]    Report Status PENDING  Incomplete    Studies/Results: US Guided Needle Placement  Result Date: 01/29/2018 INDICATION: 84 year old male with multi compartment deep intramuscular abscesses within the left hip and thigh. EXAM: 1. Ultrasound-guided drain placement 2. Ultrasound-guided aspiration MEDICATIONS: The patient is currently admitted to the hospital and receiving intravenous antibiotics. The antibiotics were administered within an appropriate time frame prior to the initiation of the procedure. ANESTHESIA/SEDATION: Fentanyl 1 mcg IV; Versed 50 mg IV Moderate Sedation Time:   33 minutes The patient was continuously monitored during the procedure by the interventional radiology nurse under my direct supervision. COMPLICATIONS: None immediate. PROCEDURE: Informed written consent was obtained from the patient after a thorough discussion of the procedural risks, benefits and alternatives. All questions were addressed. A timeout was performed prior to the initiation of the procedure. Ultrasound was used to interrogate the soft tissues of the left lower extremity. There is an elongated highly complex fluid collection within the musculature of the anterior compartment. Additionally, more heterogeneous and ill-defined complex fluid collections are present medial in the groin partially surrounding the femoral vasculature in the region of the adductor musculature. Multiple potential skin entry sites were selected and marked. The entire right thigh and groin were then sterilely prepped and draped in the standard fashion using chlorhexidine skin prep. Local anesthesia was attained by infiltration with 1% lidocaine. First, an attempt was made to place a drainage catheter into the large anterior compartment fluid collection. A small dermatotomy was made. Under real-time ultrasound guidance, an 18 gauge trocar needle was successfully advanced into the fluid collection. A 0.035 wire was then coiled in the fluid collection. The skin tract was dilated to 10 [place] and a [place] all-purpose drainage catheter was advanced over the wire and formed. Aspiration yields approximately 50 mL of thick, purulent fluid. The ultrasound probe was used to milk the length of the anterior compartment abscess in till all aspirated will material was successfully collected. The catheter was then flushed and connected to JP bulb suction. Attention was next turned to the left groin in the region of the adductor musculature. Again, local anesthesia was attained by infiltration with 1% lidocaine and a small dermatotomy  was made. This time, the 18 gauge needle was advanced into the more complex fluid collection under real-time sonographic guidance. The collection is too small for percutaneous drain placement. Therefore, aspiration was performed. Approximately 10 mL of thick bloody purulent fluid was aspirated. Post aspiration ultrasound imaging was performed demonstrating significant reduction in the volume of the abscess collections. The sample of aspirated purulent fluid was sent for Gram stain and culture. IMPRESSION: 1. Successful placement of a 10 French drainage catheter  into the left thigh anterior compartment abscess. 2. Technically successful aspiration of the visible portion of the adductor musculature abscesses. 3. Aspirated fluid sent for culture. Electronically Signed   By: Jacqulynn Cadet M.D.   On: 01/29/2018 17:24   US Guided Needle Placement  Result Date: 01/29/2018 INDICATION: 84 year old male with multi compartment deep intramuscular abscesses within the left hip and thigh. EXAM: 1. Ultrasound-guided drain placement 2. Ultrasound-guided aspiration MEDICATIONS: The patient is currently admitted to the hospital and receiving intravenous antibiotics. The antibiotics were administered within an appropriate time frame prior to the initiation of the procedure. ANESTHESIA/SEDATION: Fentanyl 1 mcg IV; Versed 50 mg IV Moderate Sedation Time:  33 minutes The patient was continuously monitored during the procedure by the interventional radiology nurse under my direct supervision. COMPLICATIONS: None immediate. PROCEDURE: Informed written consent was obtained from the patient after a thorough discussion of the procedural risks, benefits and alternatives. All questions were addressed. A timeout was performed prior to the initiation of the procedure. Ultrasound was used to interrogate the soft tissues of the left lower extremity. There is an elongated highly complex fluid collection within the musculature of the  anterior compartment. Additionally, more heterogeneous and ill-defined complex fluid collections are present medial in the groin partially surrounding the femoral vasculature in the region of the adductor musculature. Multiple potential skin entry sites were selected and marked. The entire right thigh and groin were then sterilely prepped and draped in the standard fashion using chlorhexidine skin prep. Local anesthesia was attained by infiltration with 1% lidocaine. First, an attempt was made to place a drainage catheter into the large anterior compartment fluid collection. A small dermatotomy was made. Under real-time ultrasound guidance, an 18 gauge trocar needle was successfully advanced into the fluid collection. A 0.035 wire was then coiled in the fluid collection. The skin tract was dilated to 10 [place] and a [place] all-purpose drainage catheter was advanced over the wire and formed. Aspiration yields approximately 50 mL of thick, purulent fluid. The ultrasound probe was used to milk the length of the anterior compartment abscess in till all aspirated will material was successfully collected. The catheter was then flushed and connected to JP bulb suction. Attention was next turned to the left groin in the region of the adductor musculature. Again, local anesthesia was attained by infiltration with 1% lidocaine and a small dermatotomy was made. This time, the 18 gauge needle was advanced into the more complex fluid collection under real-time sonographic guidance. The collection is too small for percutaneous drain placement. Therefore, aspiration was performed. Approximately 10 mL of thick bloody purulent fluid was aspirated. Post aspiration ultrasound imaging was performed demonstrating significant reduction in the volume of the abscess collections. The sample of aspirated purulent fluid was sent for Gram stain and culture. IMPRESSION: 1. Successful placement of a 10 French drainage catheter into the  left thigh anterior compartment abscess. 2. Technically successful aspiration of the visible portion of the adductor musculature abscesses. 3. Aspirated fluid sent for culture. Electronically Signed   By: Jacqulynn Cadet M.D.   On: 01/29/2018 17:24   Mr Femur Left W Wo Contrast  Result Date: 01/29/2018 CLINICAL DATA:  Infection in the left thigh EXAM: MR OF THE LEFT LOWER EXTREMITY WITHOUT AND WITH CONTRAST TECHNIQUE: Multiplanar, multisequence MR imaging of the left thigh was performed both before and after administration of intravenous contrast. CONTRAST:  7 cc Gadavist COMPARISON:  01/26/2018 pelvic CT FINDINGS: Bones/Joint/Cartilage T2 boundary effects, the proximal-most femur including  the femoral head, and the distal most femur include knee, are obscured. No abnormal marrow edema or other findings of osteomyelitis involving the remainder of the femur. Small to moderate knee effusion with mild synovitis in the suprapatellar bursa. Ligaments N/A Muscles and Tendons Severe myositis involving the right thigh musculature to include the hip adductor musculature, quadriceps musculature, and to a lesser extent the medial and posterior compartmental musculature. 2.0 by 3.7 cm abscess in the vicinity of the left distal operator externus/quadratus femoris muscle, image 12/11. 2.2 by 4.1 by 24.1 cm (volume = 110 cm^3) abscess in the anterior compartment interposed between the rectus femoris and vastus intermedius. Extensive edema throughout the anterior compartmental musculature, with associated accentuated muscular enhancement. No obvious current muscle necrosis. Speckled gas is present within along the hip adductor musculature, distal iloipsoas abscess, and some of the anterior compartmental upper thigh abscess including the vastus intermedius and vastus medialis. Soft tissues Severe subcutaneous edema involving the perineum, scrotum, and left thigh extending down to the knee. Notable edema along the medial  superficial side of the gracilis muscle. Contralateral muscular and subcutaneous edema is present, although not as severe is on the left side. Foley catheter is observed. IMPRESSION: 1. Severe myositis and likely fasciitis in the left thigh, with a long vertically oriented anterior compartmental abscess, considerable abscess formation with speckles of gas density along the hip adductor musculature, abnormal muscle edema most severely affecting the anterior compartment, and severe subcutaneous edema of the perineum, scrotum, and left thigh. A left hip effusion is present with mild synovitis, but we don't see definite signs of osteomyelitis with the understanding that the knee and hip are obscured by boundary artifact. 2. Right thigh muscular compartmental and subcutaneous edema is also present, although not as severe as on the left. Electronically Signed   By: Van Clines M.D.   On: 01/29/2018 07:48      Assessment/Plan:  INTERVAL HISTORY: Patient is status post placement of a 10 French drain into the large portion of left thigh abscess with 60 cc of purulent material return to the Jackson-Pratt drain attached he also had ultrasound-guided aspiration of smaller abscess in his hip abductor muscle that yielded 10 mL with drain placed   Principal Problem:   Enterococcus faecalis infection Active Problems:   Sepsis secondary to UTI ([place])   Hypothyroidism   DKA (diabetic ketoacidoses) ([place])   Normocytic anemia   Sepsis due to Bacteroides species ([name])   Sepsis without acute organ dysfunction ([place])   Psoas abscess ([place])   Abscess    Anthony Dunlap is a 84 y.o. 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  Hopefully does not have any resistant organisms found on drainage of these abscesses.  He will need protracted therapy on the order of  6 to 8 weeks of systemic IV antibiotics.  Dr. Johnnye Sima will be here tomorrow.   LOS: 13 days   Alcide Evener 01/30/2018, 11:29 AM

## 2018-01-31 ENCOUNTER — Ambulatory Visit: Payer: Medicare Other | Admitting: Urology

## 2018-01-31 DIAGNOSIS — L0291 Cutaneous abscess, unspecified: Secondary | ICD-10-CM

## 2018-01-31 DIAGNOSIS — B966 Bacteroides fragilis [B. fragilis] as the cause of diseases classified elsewhere: Secondary | ICD-10-CM

## 2018-01-31 LAB — COMPREHENSIVE METABOLIC PANEL
ALK PHOS: 330 U/L — AB (ref 38–126)
ALT: 31 U/L (ref 0–44)
AST: 56 U/L — ABNORMAL HIGH (ref 15–41)
Albumin: 1.6 g/dL — ABNORMAL LOW (ref 3.5–5.0)
Anion gap: 15 (ref 5–15)
BUN: 8 mg/dL (ref 8–23)
CALCIUM: 7.4 mg/dL — AB (ref 8.9–10.3)
CO2: 21 mmol/L — ABNORMAL LOW (ref 22–32)
Chloride: 96 mmol/L — ABNORMAL LOW (ref 98–111)
Creatinine, Ser: 0.88 mg/dL (ref 0.61–1.24)
GFR calc Af Amer: >60 mL/min (ref >60)
GFR calc non Af Amer: >60 mL/min (ref >60)
Glucose, Bld: 205 mg/dL — ABNORMAL HIGH (ref 70–99)
Potassium: 3.1 mmol/L — ABNORMAL LOW (ref 3.5–5.1)
Sodium: 132 mmol/L — ABNORMAL LOW (ref 135–145)
Total Bilirubin: 0.8 mg/dL (ref 0.3–1.2)
Total Protein: 6.4 g/dL — ABNORMAL LOW (ref 6.5–8.1)

## 2018-01-31 LAB — CBC
HCT: 27.6 % — ABNORMAL LOW (ref 39.0–52.0)
Hemoglobin: 8.4 g/dL — ABNORMAL LOW (ref 13.0–17.0)
MCH: 24.3 pg — AB (ref 26.0–34.0)
MCHC: 30.4 g/dL (ref 30.0–36.0)
MCV: 80 fL (ref 80.0–100.0)
Platelets: 1259 10*3/uL (ref 150–400)
RBC: 3.45 MIL/uL — ABNORMAL LOW (ref 4.22–5.81)
RDW: 14.9 % (ref 11.5–15.5)
WBC: 27.9 10*3/uL — ABNORMAL HIGH (ref 4.0–10.5)
nRBC: 0 % (ref 0.0–0.2)

## 2018-01-31 LAB — CREATININE, SERUM
CREATININE: 0.64 mg/dL (ref 0.61–1.24)
GFR calc Af Amer: >60 mL/min (ref >60)
GFR calc non Af Amer: >60 mL/min (ref >60)

## 2018-01-31 LAB — GLUCOSE, CAPILLARY
GLUCOSE-CAPILLARY: 221 mg/dL — AB (ref 70–99)
Glucose-Capillary: 167 mg/dL — ABNORMAL HIGH (ref 70–99)
Glucose-Capillary: 315 mg/dL — ABNORMAL HIGH (ref 70–99)
Glucose-Capillary: 301 mg/dL — ABNORMAL HIGH (ref 70–99)

## 2018-01-31 LAB — AEROBIC/ANAEROBIC CULTURE W GRAM STAIN (SURGICAL/DEEP WOUND)

## 2018-01-31 LAB — PREPARE RBC (CROSSMATCH)

## 2018-01-31 LAB — AEROBIC/ANAEROBIC CULTURE (SURGICAL/DEEP WOUND): SPECIAL REQUESTS: NORMAL

## 2018-01-31 LAB — ABO/RH: ABO/RH(D): O POS

## 2018-01-31 MED ORDER — POTASSIUM CHLORIDE CRYS ER 20 MEQ PO TBCR
40.0000 meq | EXTENDED_RELEASE_TABLET | Freq: Once | ORAL | Status: AC
  Administered 2018-01-31: 40 meq via ORAL
  Filled 2018-01-31: qty 2

## 2018-01-31 MED ORDER — ASPIRIN 325 MG PO TABS
325.0000 mg | ORAL_TABLET | Freq: Every day | ORAL | Status: DC
  Administered 2018-01-31 – 2018-02-04 (×5): 325 mg via ORAL
  Filled 2018-01-31 (×5): qty 1

## 2018-01-31 MED ORDER — SODIUM CHLORIDE 0.9% IV SOLUTION: Freq: Once | INTRAVENOUS | Status: DC

## 2018-01-31 MED ORDER — INSULIN DETEMIR 100 UNIT/ML ~~LOC~~ SOLN
15.0000 [IU] | Freq: Every day | SUBCUTANEOUS | Status: DC
  Administered 2018-01-31 – 2018-02-02 (×3): 15 [IU] via SUBCUTANEOUS
  Filled 2018-01-31 (×4): qty 0.15

## 2018-01-31 NOTE — Progress Notes (Signed)
INFECTIOUS DISEASE PROGRESS NOTE  ID: Anthony Dunlap is a 84 y.o. male with  Principal Problem:   Enterococcus faecalis infection Active Problems:   Sepsis secondary to UTI ([name])   Hypothyroidism   DKA (diabetic ketoacidoses) ([name])   Normocytic anemia   Sepsis due to Bacteroides species ([name])   Sepsis without acute organ dysfunction ([name])   Psoas abscess ([name])   Abscess  Subjective: No complaints.   Abtx:  Anti-infectives (From admission, onward)   Start     Dose/Rate Route Frequency Ordered Stop   01/25/18 1700  Ampicillin-Sulbactam (UNASYN) 3 g in sodium chloride 0.9 % 100 mL IVPB     3 g 200 mL/hr over 30 Minutes Intravenous Every 6 hours 01/25/18 1604     01/23/18 2200  vancomycin (VANCOCIN) IVPB 1000 mg/200 mL premix  Status:  Discontinued     1,000 mg 200 mL/hr over 60 Minutes Intravenous Every 24 hours 01/23/18 0817 01/24/18 1002   01/23/18 0100  piperacillin-tazobactam (ZOSYN) IVPB 3.375 g  Status:  Discontinued     3.375 g 12.5 mL/hr over 240 Minutes Intravenous Every 8 hours 01/23/18 0057 01/25/18 1549   01/23/18 0100  vancomycin (VANCOCIN) IVPB 1000 mg/200 mL premix     1,000 mg 200 mL/hr over 60 Minutes Intravenous  Once 01/23/18 0057 01/23/18 0214   01/22/18 1630  meropenem (MERREM) 1 g in sodium chloride 0.9 % 100 mL IVPB  Status:  Discontinued     1 g 200 mL/hr over 30 Minutes Intravenous Every 8 hours 01/22/18 1625 01/22/18 1626   01/22/18 1630  metroNIDAZOLE (FLAGYL) IVPB 500 mg  Status:  Discontinued     500 mg 100 mL/hr over 60 Minutes Intravenous Every 8 hours 01/22/18 1626 01/23/18 0028   01/21/18 1200  penicillin G 3 million units in sodium chloride 0.9% 100 mL IVPB  Status:  Discontinued     3 Million Units 200 mL/hr over 30 Minutes Intravenous Every 4 hours 01/21/18 1050 01/21/18 1100   01/21/18 1200  penicillin G potassium 3 Million Units in dextrose 13mL IVPB  Status:  Discontinued     3 Million Units 100 mL/hr over 30 Minutes Intravenous  Every 4 hours 01/21/18 1100 01/21/18 1106   01/21/18 1200  penicillin G 3 million units in sodium chloride 0.9% 100 mL IVPB  Status:  Discontinued     3 Million Units 200 mL/hr over 30 Minutes Intravenous Every 4 hours 01/21/18 1106 01/22/18 1625   01/19/18 1400  ceFAZolin (ANCEF) IVPB 1 g/50 mL premix  Status:  Discontinued     1 g 100 mL/hr over 30 Minutes Intravenous Every 8 hours 01/19/18 1104 01/21/18 1050   01/17/18 1200  ceFEPIme (MAXIPIME) 1 g in sodium chloride 0.9 % 100 mL IVPB  Status:  Discontinued     1 g 200 mL/hr over 30 Minutes Intravenous Every 12 hours 01/17/18 0741 01/19/18 1104   01/17/18 0230  ceFEPIme (MAXIPIME) 2 g in sodium chloride 0.9 % 100 mL IVPB     2 g 200 mL/hr over 30 Minutes Intravenous  Once 01/17/18 0224 01/17/18 0400      Medications:  Scheduled: . sodium chloride   Intravenous Once  . aspirin EC  81 mg Oral Daily  . Chlorhexidine Gluconate Cloth  6 each Topical Q0600  . enoxaparin (LOVENOX) injection  40 mg Subcutaneous Q24H  . feeding supplement (GLUCERNA SHAKE)  237 mL Oral BID BM  . insulin aspart  0-9 Units Subcutaneous TID WC  .  insulin detemir  15 Units Subcutaneous QHS  . levothyroxine  25 mcg Oral Q0600  . mupirocin ointment  1 application Nasal BID  . povidone-iodine  2 application Topical Once  . pravastatin  10 mg Oral q1800  . sodium chloride flush  5 mL Intracatheter Q8H  . sodium chloride flush  5 mL Intracatheter Q8H    Objective: Vital signs in last 24 hours: Temp:  [98.8 F (37.1 C)-98.9 F (37.2 C)] 98.8 F (37.1 C) (12/13 0429) Pulse Rate:  [105-115] 112 (12/13 0429) Resp:  [18-24] 19 (12/13 0429) BP: (129-130)/(69-76) 130/70 (12/13 0429) SpO2:  [96 %] 96 % (12/13 0429)   General appearance: alert, cooperative and no distress Resp: clear to auscultation bilaterally Cardio: regular rate and rhythm GI: normal findings: bowel sounds normal, soft, non-tender and RMQ drain in place. area clean.   Lab Results Recent  Labs    01/30/18 0225 01/31/18 0537 01/31/18 1041  WBC 19.3*  --   --   HGB 7.5*  --   --   HCT 26.0*  --   --   NA 133*  --  132*  K 3.6  --  3.1*  CL 96*  --  96*  CO2 27  --  21*  BUN 8  --  8  CREATININE 0.69 0.64 0.88   Liver Panel Recent Labs    01/30/18 0225 01/31/18 1041  PROT 5.7* 6.4*  ALBUMIN 1.4* 1.6*  AST 38 56*  ALT 31 31  ALKPHOS 249* 330*  BILITOT 0.3 0.8   Sedimentation Rate No results for input(s): ESRSEDRATE in the last 72 hours. C-Reactive Protein No results for input(s): CRP in the last 72 hours.  Microbiology: Recent Results (from the past 240 hour(s))  Aerobic/Anaerobic Culture (surgical/deep wound)     Status: None   Collection Time: 01/25/18 11:19 AM  Result Value Ref Range Status   Specimen Description ABSCESS  Final   Special Requests Normal  Final   Gram Stain   Final    ABUNDANT WBC PRESENT, PREDOMINANTLY PMN MODERATE GRAM POSITIVE RODS MODERATE GRAM POSITIVE COCCI FEW GRAM NEGATIVE RODS    Culture   Final    MODERATE ENTEROCOCCUS FAECALIS MODERATE BACTEROIDES THETAIOTAOMICRON BETA LACTAMASE NEGATIVE Performed at [facility], [address], [place], [place] [address]    Report Status 01/31/2018 FINAL  Final   Organism ID, Bacteria ENTEROCOCCUS FAECALIS  Final      Susceptibility   Enterococcus faecalis - MIC*    AMPICILLIN <=2 SENSITIVE Sensitive     VANCOMYCIN 1 SENSITIVE Sensitive     GENTAMICIN SYNERGY RESISTANT Resistant     * MODERATE ENTEROCOCCUS FAECALIS  Surgical pcr screen     Status: Abnormal   Collection Time: 01/28/18  1:15 AM  Result Value Ref Range Status   MRSA, PCR POSITIVE (A) NEGATIVE Final    Comment: RESULT CALLED TO, READ BACK BY AND VERIFIED WITH: HARGROVE,C RN 0308 01/28/18 MITCHELL,L    Staphylococcus aureus POSITIVE (A) NEGATIVE Final    Comment: (NOTE) The Xpert SA Assay (FDA approved for NASAL specimens in patients 32 years of age and older), is one component of a  comprehensive surveillance program. It is not intended to diagnose infection nor to guide or monitor treatment.   Aerobic/Anaerobic Culture (surgical/deep wound)     Status: None (Preliminary result)   Collection Time: 01/29/18  3:27 PM  Result Value Ref Range Status   Specimen Description ABSCESS LEFT THIGH  Final  Special Requests NONE  Final   Gram Stain   Final    ABUNDANT WBC PRESENT, PREDOMINANTLY PMN FEW GRAM POSITIVE COCCI RARE GRAM POSITIVE RODS    Culture   Final    CULTURE REINCUBATED FOR BETTER GROWTH Performed at [facility], [place] [address], [place], [place] [address]    Report Status PENDING  Incomplete    Studies/Results: US Guided Needle Placement  Result Date: 01/29/2018 INDICATION: 84 year old male with multi compartment deep intramuscular abscesses within the left hip and thigh. EXAM: 1. Ultrasound-guided drain placement 2. Ultrasound-guided aspiration MEDICATIONS: The patient is currently admitted to the hospital and receiving intravenous antibiotics. The antibiotics were administered within an appropriate time frame prior to the initiation of the procedure. ANESTHESIA/SEDATION: Fentanyl 1 mcg IV; Versed 50 mg IV Moderate Sedation Time:  33 minutes The patient was continuously monitored during the procedure by the interventional radiology nurse under my direct supervision. COMPLICATIONS: None immediate. PROCEDURE: Informed written consent was obtained from the patient after a thorough discussion of the procedural risks, benefits and alternatives. All questions were addressed. A timeout was performed prior to the initiation of the procedure. Ultrasound was used to interrogate the soft tissues of the left lower extremity. There is an elongated highly complex fluid collection within the musculature of the anterior compartment. Additionally, more heterogeneous and ill-defined complex fluid collections are present medial in the groin partially surrounding the femoral  vasculature in the region of the adductor musculature. Multiple potential skin entry sites were selected and marked. The entire right thigh and groin were then sterilely prepped and draped in the standard fashion using chlorhexidine skin prep. Local anesthesia was attained by infiltration with 1% lidocaine. First, an attempt was made to place a drainage catheter into the large anterior compartment fluid collection. A small dermatotomy was made. Under real-time ultrasound guidance, an 18 gauge trocar needle was successfully advanced into the fluid collection. A 0.035 wire was then coiled in the fluid collection. The skin tract was dilated to 10 [place] and a [place] all-purpose drainage catheter was advanced over the wire and formed. Aspiration yields approximately 50 mL of thick, purulent fluid. The ultrasound probe was used to milk the length of the anterior compartment abscess in till all aspirated will material was successfully collected. The catheter was then flushed and connected to JP bulb suction. Attention was next turned to the left groin in the region of the adductor musculature. Again, local anesthesia was attained by infiltration with 1% lidocaine and a small dermatotomy was made. This time, the 18 gauge needle was advanced into the more complex fluid collection under real-time sonographic guidance. The collection is too small for percutaneous drain placement. Therefore, aspiration was performed. Approximately 10 mL of thick bloody purulent fluid was aspirated. Post aspiration ultrasound imaging was performed demonstrating significant reduction in the volume of the abscess collections. The sample of aspirated purulent fluid was sent for Gram stain and culture. IMPRESSION: 1. Successful placement of a 10 French drainage catheter into the left thigh anterior compartment abscess. 2. Technically successful aspiration of the visible portion of the adductor musculature abscesses. 3. Aspirated fluid sent  for culture. Electronically Signed   By: Jacqulynn Cadet M.D.   On: 01/29/2018 17:24   US Guided Needle Placement  Result Date: 01/29/2018 INDICATION: 84 year old male with multi compartment deep intramuscular abscesses within the left hip and thigh. EXAM: 1. Ultrasound-guided drain placement 2. Ultrasound-guided aspiration MEDICATIONS: The patient is currently admitted to the hospital and  receiving intravenous antibiotics. The antibiotics were administered within an appropriate time frame prior to the initiation of the procedure. ANESTHESIA/SEDATION: Fentanyl 1 mcg IV; Versed 50 mg IV Moderate Sedation Time:  33 minutes The patient was continuously monitored during the procedure by the interventional radiology nurse under my direct supervision. COMPLICATIONS: None immediate. PROCEDURE: Informed written consent was obtained from the patient after a thorough discussion of the procedural risks, benefits and alternatives. All questions were addressed. A timeout was performed prior to the initiation of the procedure. Ultrasound was used to interrogate the soft tissues of the left lower extremity. There is an elongated highly complex fluid collection within the musculature of the anterior compartment. Additionally, more heterogeneous and ill-defined complex fluid collections are present medial in the groin partially surrounding the femoral vasculature in the region of the adductor musculature. Multiple potential skin entry sites were selected and marked. The entire right thigh and groin were then sterilely prepped and draped in the standard fashion using chlorhexidine skin prep. Local anesthesia was attained by infiltration with 1% lidocaine. First, an attempt was made to place a drainage catheter into the large anterior compartment fluid collection. A small dermatotomy was made. Under real-time ultrasound guidance, an 18 gauge trocar needle was successfully advanced into the fluid collection. A 0.035 wire was  then coiled in the fluid collection. The skin tract was dilated to 10 [place] and a [place] all-purpose drainage catheter was advanced over the wire and formed. Aspiration yields approximately 50 mL of thick, purulent fluid. The ultrasound probe was used to milk the length of the anterior compartment abscess in till all aspirated will material was successfully collected. The catheter was then flushed and connected to JP bulb suction. Attention was next turned to the left groin in the region of the adductor musculature. Again, local anesthesia was attained by infiltration with 1% lidocaine and a small dermatotomy was made. This time, the 18 gauge needle was advanced into the more complex fluid collection under real-time sonographic guidance. The collection is too small for percutaneous drain placement. Therefore, aspiration was performed. Approximately 10 mL of thick bloody purulent fluid was aspirated. Post aspiration ultrasound imaging was performed demonstrating significant reduction in the volume of the abscess collections. The sample of aspirated purulent fluid was sent for Gram stain and culture. IMPRESSION: 1. Successful placement of a 10 French drainage catheter into the left thigh anterior compartment abscess. 2. Technically successful aspiration of the visible portion of the adductor musculature abscesses. 3. Aspirated fluid sent for culture. Electronically Signed   By: Jacqulynn Cadet M.D.   On: 01/29/2018 17:24     Assessment/Plan: Multiple abscesses E facalis, Bacteroides  Total days of antibiotics: 14 (unasyn)  Await his repeat Cx result.  Continue unsayn Watch drain output (178 yesterday)         Bobby Rumpf MD, FACP Infectious Diseases (pager) [phone] [email or web address] 01/31/2018, 11:44 AM  LOS: 14 days

## 2018-01-31 NOTE — Progress Notes (Signed)
Physical Therapy Treatment Patient Details Name: Anthony Dunlap MRN: [id number] DOB: April 03, 1933 Today's Date: 01/31/2018    History of Present Illness 84 y.o. male with medical history significant for hypothyroidism, insulin-dependent diabetes mellitus, hyperlipidemia, and history of prostate cancer with chronic Foley catheter, now presenting to the emergency department for evaluation of rigors.  Patient reports that he was in his usual state of health until overnight when he began sweating and developed shaking chills.  In the ED, pt was febrile with HR in 140s.  He also had serum glucose of 440 with anion gap of 12.  Pt admitted with sepsis and CT revealed muliple intramuscular abscesses of the abdomen and pelvis.  Pt is s/p drainage placement on 12/7 and drainage placement on 12/11.     PT Comments    Pt up in chair, DTR in room. Pt awake, agreeable to participate, heavily motivated and puts forth big effort. Noted glossy skin in BLE with STG III pitting edema throughout, >55mm depth in some parts near the ankle.  Performed some seated AA/ROM in quads and ankles, then working on STS transfers, which require max-total Assist, pt pulling on bed rail fWD but unable to fully rise, only tolerating ~20seconds standing each bout. Each transfer and attemtp with some mild incontinence of bowel which is translucent, colorless gel, verbal confirmation with DTR and RN as it visually appears identical to hand sanitizer gel. Pt is helped with a clean gown and cleaned up, physically exhausted at end of session. Heels floated at end of session.     Follow Up Recommendations  SNF;Supervision/Assistance - 24 hour     Equipment Recommendations       Recommendations for Other Services OT consult     Precautions / Restrictions Precautions Precautions: Fall Precaution Comments: 2 jackson prat drains in place  Restrictions Weight Bearing Restrictions: No    Mobility  Bed Mobility                General bed mobility comments: received up in chair. STEDY with Nursing earlier.   Transfers Overall transfer level: Needs assistance Equipment used: (bed rail for pulling self up) Transfers: Sit to/from Stand Sit to Stand: Total assist;+2 physical assistance;+2 safety/equipment;From elevated surface         General transfer comment: very weak, recliner pushed to facing EOB, with lower rail directly in front, MaxA given as pt ppulls selgf up into a locked knee hip hinge, then tibial block provided and PA force at pelvis, while pt uses rail for trunk righting, unable to come to complete full standing. (requires 4 attempts to come up, then is able to toelrate partial standing 5x20 seconds. )  Ambulation/Gait Ambulation/Gait assistance: (no appripriate at this time, pt VERY weak)               Stairs             Wheelchair Mobility    Modified Rankin (Stroke Patients Only)       Balance           Standing balance support: During functional activity;Bilateral upper extremity supported Standing balance-Leahy Scale: Zero                              Cognition Arousal/Alertness: Awake/alert Behavior During Therapy: WFL for tasks assessed/performed Overall Cognitive Status: Within Functional Limits for tasks assessed  Exercises General Exercises - Lower Extremity Ankle Circles/Pumps: AAROM;Seated;Both;Limitations Ankle Circles/Pumps Limitations: gravity resisted Long Arc Quad: Seated;Both;AAROM;Strengthening;15 reps    General Comments        Pertinent Vitals/Pain Pain Assessment: No/denies pain(some Left knee pain when movement is initiated)    Home Living                      Prior Function            PT Goals (current goals can now be found in the care plan section) Acute Rehab PT Goals Patient Stated Goal: return home with family to assist PT Goal Formulation: With  patient Time For Goal Achievement: 02/11/18 Potential to Achieve Goals: Poor Progress towards PT goals: Not progressing toward goals - comment    Frequency    Min 3X/week      PT Plan Current plan remains appropriate    Co-evaluation              AM-PAC PT "6 Clicks" Mobility   Outcome Measure  Help needed turning from your back to your side while in a flat bed without using bedrails?: Total Help needed moving from lying on your back to sitting on the side of a flat bed without using bedrails?: Total Help needed moving to and from a bed to a chair (including a wheelchair)?: Total Help needed standing up from a chair using your arms (e.g., wheelchair or bedside chair)?: Total Help needed to walk in hospital room?: Total Help needed climbing 3-5 steps with a railing? : Total 6 Click Score: 6    End of Session   Activity Tolerance: Patient limited by fatigue;Patient tolerated treatment well Patient left: in chair;with call bell/phone within reach;with family/visitor present Nurse Communication: Mobility status PT Visit Diagnosis: Unsteadiness on feet (R26.81);Other abnormalities of gait and mobility (R26.89);Muscle weakness (generalized) (M62.81)     Time: 1103-1594 PT Time Calculation (min) (ACUTE ONLY): 30 min  Charges:  $Therapeutic Activity: 23-37 mins                     3:32 PM, 01/31/18 Etta Grandchild, PT, DPT Physical Therapist - [place] [phone] (Pager)  [phone] (Office)      Buccola,Allan C 01/31/2018, 3:28 PM

## 2018-01-31 NOTE — Progress Notes (Signed)
Subjective: 3 Days Post-Op Procedure(s) (LRB): INCISION AND DRAINAGE LEFT THIGH ABSCESS (Left) Patient reports pain as mild.  No pain to left thigh this am. On unasyn  Objective: Vital signs in last 24 hours: Temp:  [98.8 F (37.1 C)-98.9 F (37.2 C)] 98.8 F (37.1 C) (12/13 0429) Pulse Rate:  [105-115] 112 (12/13 0429) Resp:  [18-24] 19 (12/13 0429) BP: (129-130)/(69-76) 130/70 (12/13 0429) SpO2:  [96 %] 96 % (12/13 0429)  Intake/Output from previous day: 12/12 0701 - 12/13 0700 In: 1426.2 [P.O.:460; I.V.:654.4; IV Piggyback:311.8] Out: 1403 [Urine:1225; Drains:178] Intake/Output this shift: No intake/output data recorded.  Recent Labs    01/30/18 0225  HGB 7.5*   Recent Labs    01/30/18 0225  WBC 19.3*  RBC 3.25*  HCT 26.0*  PLT 883*   Recent Labs    01/30/18 0225 01/31/18 0537  NA 133*  --   K 3.6  --   CL 96*  --   CO2 27  --   BUN 8  --   CREATININE 0.69 0.64  GLUCOSE 182*  --   CALCIUM 7.3*  --    No results for input(s): LABPT, INR in the last 72 hours.  Neurologically intact Neurovascular intact Sensation intact distally Intact pulses distally Dorsiflexion/Plantar flexion intact No cellulitis present Compartment soft Left thigh drain in place and actively draining purulent fluid.  Thigh is soft and without erythema or tenderness.    Assessment/Plan: 3 Days Post-Op Procedure(s) (LRB): INCISION AND DRAINAGE LEFT THIGH ABSCESS (Left)  Continue with left thigh drain Continue unasyn per ID      Aundra Dubin 01/31/2018, 7:43 AM

## 2018-01-31 NOTE — Plan of Care (Signed)

## 2018-01-31 NOTE — Progress Notes (Signed)
Referring Physician(s): Jacki Cones  Supervising Physician: Sandi Mariscal  Patient Status:  [facility] - In-pt  Chief Complaint: Rectus sheath abscess S/P drain placement by Dr. Kathlene Cote 01/25/2018 Thigh abscess S/P Aspiration and drain placement anterior thigh abscess 01/29/18 by Dr. Laurence Ferrari  Subjective:  Patient up working with PT. No complaints.  Allergies: Patient has no known allergies.  Medications: Prior to Admission medications   Medication Sig Start Date End Date Taking? Authorizing Provider  aspirin EC 81 MG tablet Take 81 mg by mouth daily.   Yes [provider]  LEVEMIR FLEXTOUCH 100 UNIT/ML Pen Inject 30 Units into the skin daily. 01/07/18  Yes Kathie Dike, MD  levothyroxine (SYNTHROID, LEVOTHROID) 25 MCG tablet Take 25 mcg by mouth daily before breakfast.   Yes [provider]  pravastatin (PRAVACHOL) 10 MG tablet Take 10 mg by mouth daily.   Yes [provider]     Vital Signs: BP 130/70 (BP Location: Right Arm)   Pulse (!) 112   Temp 98.8 F (37.1 C) (Oral)   Resp 19   Ht 5\' 8"  (1.727 m)   Wt 72.9 kg   SpO2 96%   BMI 24.44 kg/m   Physical Exam Awake, NAD Working with PT Left thigh drain with purulent drainage in bulb, ~175 mL output Medial abdomen drain with only 3 mL recorded output.  Imaging: US Guided Needle Placement  Result Date: 01/29/2018 INDICATION: 84 year old male with multi compartment deep intramuscular abscesses within the left hip and thigh. EXAM: 1. Ultrasound-guided drain placement 2. Ultrasound-guided aspiration MEDICATIONS: The patient is currently admitted to the hospital and receiving intravenous antibiotics. The antibiotics were administered within an appropriate time frame prior to the initiation of the procedure. ANESTHESIA/SEDATION: Fentanyl 1 mcg IV; Versed 50 mg IV Moderate Sedation Time:  33 minutes The patient was continuously monitored during the procedure by the interventional  radiology nurse under my direct supervision. COMPLICATIONS: None immediate. PROCEDURE: Informed written consent was obtained from the patient after a thorough discussion of the procedural risks, benefits and alternatives. All questions were addressed. A timeout was performed prior to the initiation of the procedure. Ultrasound was used to interrogate the soft tissues of the left lower extremity. There is an elongated highly complex fluid collection within the musculature of the anterior compartment. Additionally, more heterogeneous and ill-defined complex fluid collections are present medial in the groin partially surrounding the femoral vasculature in the region of the adductor musculature. Multiple potential skin entry sites were selected and marked. The entire right thigh and groin were then sterilely prepped and draped in the standard fashion using chlorhexidine skin prep. Local anesthesia was attained by infiltration with 1% lidocaine. First, an attempt was made to place a drainage catheter into the large anterior compartment fluid collection. A small dermatotomy was made. Under real-time ultrasound guidance, an 18 gauge trocar needle was successfully advanced into the fluid collection. A 0.035 wire was then coiled in the fluid collection. The skin tract was dilated to 10 [place] and a [place] all-purpose drainage catheter was advanced over the wire and formed. Aspiration yields approximately 50 mL of thick, purulent fluid. The ultrasound probe was used to milk the length of the anterior compartment abscess in till all aspirated will material was successfully collected. The catheter was then flushed and connected to JP bulb suction. Attention was next turned to the left groin in the region of the adductor musculature. Again, local anesthesia was attained by infiltration with 1% lidocaine and a  small dermatotomy was made. This time, the 18 gauge needle was advanced into the more complex fluid collection  under real-time sonographic guidance. The collection is too small for percutaneous drain placement. Therefore, aspiration was performed. Approximately 10 mL of thick bloody purulent fluid was aspirated. Post aspiration ultrasound imaging was performed demonstrating significant reduction in the volume of the abscess collections. The sample of aspirated purulent fluid was sent for Gram stain and culture. IMPRESSION: 1. Successful placement of a 10 French drainage catheter into the left thigh anterior compartment abscess. 2. Technically successful aspiration of the visible portion of the adductor musculature abscesses. 3. Aspirated fluid sent for culture. Electronically Signed   By: Jacqulynn Cadet M.D.   On: 01/29/2018 17:24   US Guided Needle Placement  Result Date: 01/29/2018 INDICATION: 84 year old male with multi compartment deep intramuscular abscesses within the left hip and thigh. EXAM: 1. Ultrasound-guided drain placement 2. Ultrasound-guided aspiration MEDICATIONS: The patient is currently admitted to the hospital and receiving intravenous antibiotics. The antibiotics were administered within an appropriate time frame prior to the initiation of the procedure. ANESTHESIA/SEDATION: Fentanyl 1 mcg IV; Versed 50 mg IV Moderate Sedation Time:  33 minutes The patient was continuously monitored during the procedure by the interventional radiology nurse under my direct supervision. COMPLICATIONS: None immediate. PROCEDURE: Informed written consent was obtained from the patient after a thorough discussion of the procedural risks, benefits and alternatives. All questions were addressed. A timeout was performed prior to the initiation of the procedure. Ultrasound was used to interrogate the soft tissues of the left lower extremity. There is an elongated highly complex fluid collection within the musculature of the anterior compartment. Additionally, more heterogeneous and ill-defined complex fluid collections are  present medial in the groin partially surrounding the femoral vasculature in the region of the adductor musculature. Multiple potential skin entry sites were selected and marked. The entire right thigh and groin were then sterilely prepped and draped in the standard fashion using chlorhexidine skin prep. Local anesthesia was attained by infiltration with 1% lidocaine. First, an attempt was made to place a drainage catheter into the large anterior compartment fluid collection. A small dermatotomy was made. Under real-time ultrasound guidance, an 18 gauge trocar needle was successfully advanced into the fluid collection. A 0.035 wire was then coiled in the fluid collection. The skin tract was dilated to 10 [place] and a [place] all-purpose drainage catheter was advanced over the wire and formed. Aspiration yields approximately 50 mL of thick, purulent fluid. The ultrasound probe was used to milk the length of the anterior compartment abscess in till all aspirated will material was successfully collected. The catheter was then flushed and connected to JP bulb suction. Attention was next turned to the left groin in the region of the adductor musculature. Again, local anesthesia was attained by infiltration with 1% lidocaine and a small dermatotomy was made. This time, the 18 gauge needle was advanced into the more complex fluid collection under real-time sonographic guidance. The collection is too small for percutaneous drain placement. Therefore, aspiration was performed. Approximately 10 mL of thick bloody purulent fluid was aspirated. Post aspiration ultrasound imaging was performed demonstrating significant reduction in the volume of the abscess collections. The sample of aspirated purulent fluid was sent for Gram stain and culture. IMPRESSION: 1. Successful placement of a 10 French drainage catheter into the left thigh anterior compartment abscess. 2. Technically successful aspiration of the visible portion  of the adductor musculature abscesses. 3. Aspirated fluid sent  for culture. Electronically Signed   By: Jacqulynn Cadet M.D.   On: 01/29/2018 17:24   Mr Femur Left W Wo Contrast  Result Date: 01/29/2018 CLINICAL DATA:  Infection in the left thigh EXAM: MR OF THE LEFT LOWER EXTREMITY WITHOUT AND WITH CONTRAST TECHNIQUE: Multiplanar, multisequence MR imaging of the left thigh was performed both before and after administration of intravenous contrast. CONTRAST:  7 cc Gadavist COMPARISON:  01/26/2018 pelvic CT FINDINGS: Bones/Joint/Cartilage T2 boundary effects, the proximal-most femur including the femoral head, and the distal most femur include knee, are obscured. No abnormal marrow edema or other findings of osteomyelitis involving the remainder of the femur. Small to moderate knee effusion with mild synovitis in the suprapatellar bursa. Ligaments N/A Muscles and Tendons Severe myositis involving the right thigh musculature to include the hip adductor musculature, quadriceps musculature, and to a lesser extent the medial and posterior compartmental musculature. 2.0 by 3.7 cm abscess in the vicinity of the left distal operator externus/quadratus femoris muscle, image 12/11. 2.2 by 4.1 by 24.1 cm (volume = 110 cm^3) abscess in the anterior compartment interposed between the rectus femoris and vastus intermedius. Extensive edema throughout the anterior compartmental musculature, with associated accentuated muscular enhancement. No obvious current muscle necrosis. Speckled gas is present within along the hip adductor musculature, distal iloipsoas abscess, and some of the anterior compartmental upper thigh abscess including the vastus intermedius and vastus medialis. Soft tissues Severe subcutaneous edema involving the perineum, scrotum, and left thigh extending down to the knee. Notable edema along the medial superficial side of the gracilis muscle. Contralateral muscular and subcutaneous edema is present,  although not as severe is on the left side. Foley catheter is observed. IMPRESSION: 1. Severe myositis and likely fasciitis in the left thigh, with a long vertically oriented anterior compartmental abscess, considerable abscess formation with speckles of gas density along the hip adductor musculature, abnormal muscle edema most severely affecting the anterior compartment, and severe subcutaneous edema of the perineum, scrotum, and left thigh. A left hip effusion is present with mild synovitis, but we don't see definite signs of osteomyelitis with the understanding that the knee and hip are obscured by boundary artifact. 2. Right thigh muscular compartmental and subcutaneous edema is also present, although not as severe as on the left. Electronically Signed   By: Van Clines M.D.   On: 01/29/2018 07:48    Labs:  CBC: Recent Labs    01/27/18 0228 01/28/18 0159 01/30/18 0225 01/31/18 1041  WBC 28.4* 26.5* 19.3* 27.9*  HGB 8.3* 8.1* 7.5* 8.4*  HCT 28.0* 26.3* 26.0* 27.6*  PLT 884* 796* 883* 1,259*    COAGS: Recent Labs    01/03/18 1929 01/17/18 0443 01/25/18 0348  INR 1.20 1.31 1.15  APTT  --  37*  --     BMP: Recent Labs    01/27/18 0228 01/28/18 0159 01/29/18 0132 01/30/18 0225 01/31/18 0537 01/31/18 1041  NA 129* 129*  --  133*  --  132*  K 3.5 3.7  --  3.6  --  3.1*  CL 94* 93*  --  96*  --  96*  CO2 25 25  --  27  --  21*  GLUCOSE 180* 301*  --  182*  --  205*  BUN 10 11  --  8  --  8  CALCIUM 7.2* 7.3*  --  7.3*  --  7.4*  CREATININE 0.79 0.82 0.83 0.69 0.64 0.88  GFRNONAA >60 >60 >60 >60 >60 >60  GFRAA >60 >60 >60 >60 >60 >60    LIVER FUNCTION TESTS: Recent Labs    01/05/18 0551 01/17/18 0237 01/30/18 0225 01/31/18 1041  BILITOT 0.3 0.7 0.3 0.8  AST 21 19 38 56*  ALT 19 15 31 31   ALKPHOS 141* 152* 249* 330*  PROT 7.1 7.2 5.7* 6.4*  ALBUMIN 2.6* 2.5* 1.4* 1.6*    Assessment and Plan:  Rectus sheath abscess S/P drain placement by Dr. Kathlene Cote  01/25/2018  Thigh abscess S/P Aspiration and drain placement anterior thigh abscess 01/29/18 by Dr. Laurence Ferrari  Continue routine drain care.  Would repeat CT early next week to evaluate abdomen and thigh (as long as thigh output has decreased significantly)  Electronically Signed: Murrell Redden, PA-C 01/31/2018, 1:46 PM    I spent a total of 15 Minutes at the the patient's bedside AND on the patient's hospital floor or unit, greater than 50% of which was counseling/coordinating care for follow up drains.

## 2018-01-31 NOTE — Social Work (Signed)
CSW updated [employer] that pt would likely not discharge this weekend, will have to resubmit for authorization when more clarity to disposition is available.   Pt daughter [name] aware and updated her on pt offers. His two preferred facilities are not able to offer a bed. [facility] offered and CSW will bring information by the room for pt and pt daughter.   They are between home and SNF- educated daughter on if pt needs iv abx that there would have to be family willing to assist. Pt daughter states understanding.  CSW continuing to follow for support with disposition when medically appropriate.  Westley Hummer, MSW, [place] Work [phone]

## 2018-01-31 NOTE — Progress Notes (Addendum)
PROGRESS NOTE    Anthony Dunlap  RJJ:[id number] DOB: December 24, 1933 DOA: 01/17/2018 PCP: [name], MD Brief Narrative:84 y.o.malewith medical history significant forhypothyroidism, insulin-dependent diabetes mellitus, hyperlipidemia, and history of prostate cancer with chronic Foley catheter, now presenting to the emergency department for evaluation of rigors. Patient reports that he was in his usual state of health until overnight when he began sweating and developed shaking chills. In the ED, pt was febrile with HR in 140s. He also had serum glucose of 440 with anion gap of 12. He was initially started on IVF and IV insulin. He denies any recent cough, shortness of breath, chest pain, vomiting, or diarrhea. Patient was admitted for sepsis due topresumptive UTI initially. Urine culture was not revealing. Blood cultures grew peptostreptococcus and Bacteroides thetaiodomicron. Antibiotics were adjusted Assessment & Plan:   Principal Problem:   Enterococcus faecalis infection Active Problems:   Sepsis secondary to UTI ([facility])   Hypothyroidism   DKA (diabetic ketoacidoses) ([name])   Normocytic anemia   Sepsis due to Bacteroides species ([name])   Sepsis without acute organ dysfunction ([name])   Psoas abscess ([name])   Abscess   1 sepsis secondary to bacteremia with Enterococcus fecalis  CT scan of the abdomen and pelvis showing multiple intramuscular abscesses. 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  Echocardiogram 01/19/2018 no evidence of vegetation  S/P IR ct guided drainage of right rectus abscess.drain in place  S/P IR drainage of Left thigh abscess drain in place 12/11  Continue unasyn.Follow up CT early next week .   2]type 2 dm increase dose of levemir for better control  3]hypothyroidsm-continue synthroid  4]prostate cancer -has chronic foley  5]Reactive Thrombocytosis-increase aspirin to 325 mg.  6]hypokalemia replete  7]abnormal  LFTS-worsening.check RUQ ultrasound    Estimated body mass index is 24.44 kg/m as calculated from the following:   Height as of this encounter: 5\' 8"  (1.727 m).   Weight as of this encounter: 72.9 kg.  DVT prophylaxis: lovenox Code Status:full Family Communication:dw daughter Disposition Plan: pending CT scan abdomen pelvis and upper thighs  early next week,follow cultures and ID recommendations.needs SNF placement. Consultants:  Id ortho ir Procedures 2 drains right rectus sheath and left thigh Antimicrobials: unasyn Subjective:feels okay no specific complaints   Objective: Vitals:   01/30/18 0439 01/30/18 1459 01/30/18 2045 01/31/18 0429  BP: 136/73 129/76 130/69 130/70  Pulse: (!) 105 (!) 115 (!) 105 (!) 112  Resp: 18 18 (!) 24 19  Temp: 98.4 F (36.9 C) 98.9 F (37.2 C) 98.8 F (37.1 C) 98.8 F (37.1 C)  TempSrc: Oral Oral Oral Oral  SpO2: 100% 96% 96% 96%  Weight:      Height:        Intake/Output Summary (Last 24 hours) at 01/31/2018 1008 Last data filed at 01/31/2018 0945 Gross per 24 hour  Intake 1546.19 ml  Output 1403 ml  Net 143.19 ml   Filed Weights   01/17/18 0209 01/17/18 0600 01/23/18 2337  Weight: 70.3 kg 67.1 kg 72.9 kg    Examination:  General exam: Appears calm and comfortable  Respiratory system: Clear to auscultation. Respiratory effort normal. Cardiovascular system: S1 & S2 heard, RRR. No JVD, murmurs, rubs, gallops or clicks. No pedal edema. Gastrointestinal system: Abdomen is nondistended, soft and nontender. No organomegaly or masses felt. Normal bowel sounds heard.DRAINS right side abdomen and left thigh  Central nervous system: Alert and oriented. No focal neurological deficits. Extremities: Symmetric 5 x 5 power. Skin: No rashes, lesions or ulcers  Psychiatry: Judgement and insight appear normal. Mood & affect appropriate.     Data Reviewed: I have personally reviewed following labs and imaging studies  CBC: Recent Labs    Lab 07-Feb-2018 0348 01/26/18 0322 01/27/18 0228 01/28/18 0159 01/30/18 0225  WBC 23.2* 23.8* 28.4* 26.5* 19.3*  NEUTROABS 19.6*  --   --   --  16.0*  HGB 7.9* 8.7* 8.3* 8.1* 7.5*  HCT 25.7* 29.4* 28.0* 26.3* 26.0*  MCV 77.6* 80.3 79.3* 78.7* 80.0  PLT 678* 762* 884* 796* 433*   Basic Metabolic Panel: Recent Labs  Lab Feb 07, 2018 0348 01/26/18 0322 01/27/18 0228 01/28/18 0159 01/29/18 0132 01/30/18 0225 01/31/18 0537  NA 130* 134* 129* 129*  --  133*  --   K 2.7* 3.9 3.5 3.7  --  3.6  --   CL 94* 97* 94* 93*  --  96*  --   CO2 24 24 25 25   --  27  --   GLUCOSE 186* 140* 180* 301*  --  182*  --   BUN 12 11 10 11   --  8  --   CREATININE 0.96 0.79 0.79 0.82 0.83 0.69 0.64  CALCIUM 7.4* 7.7* 7.2* 7.3*  --  7.3*  --    GFR: Estimated Creatinine Clearance: 68.9 mL/min (by C-G formula based on SCr of 0.64 mg/dL). Liver Function Tests: Recent Labs  Lab 01/30/18 0225  AST 38  ALT 31  ALKPHOS 249*  BILITOT 0.3  PROT 5.7*  ALBUMIN 1.4*   No results for input(s): LIPASE, AMYLASE in the last 168 hours. No results for input(s): AMMONIA in the last 168 hours. Coagulation Profile: Recent Labs  Lab 02-07-18 0348  INR 1.15   Cardiac Enzymes: No results for input(s): CKTOTAL, CKMB, CKMBINDEX, TROPONINI in the last 168 hours. BNP (last 3 results) No results for input(s): PROBNP in the last 8760 hours. HbA1C: No results for input(s): HGBA1C in the last 72 hours. CBG: Recent Labs  Lab 01/30/18 1214 01/30/18 1715 01/30/18 2041 01/30/18 2324 01/31/18 0811  GLUCAP 207* 283* 203* 247* 167*   Lipid Profile: No results for input(s): CHOL, HDL, LDLCALC, TRIG, CHOLHDL, LDLDIRECT in the last 72 hours. Thyroid Function Tests: No results for input(s): TSH, T4TOTAL, FREET4, T3FREE, THYROIDAB in the last 72 hours. Anemia Panel: No results for input(s): VITAMINB12, FOLATE, FERRITIN, TIBC, IRON, RETICCTPCT in the last 72 hours. Sepsis Labs: No results for input(s): PROCALCITON,  LATICACIDVEN in the last 168 hours.  Recent Results (from the past 240 hour(s))  Aerobic/Anaerobic Culture (surgical/deep wound)     Status: None (Preliminary result)   Collection Time: 07-Feb-2018 11:19 AM  Result Value Ref Range Status   Specimen Description ABSCESS  Final   Special Requests Normal  Final   Gram Stain   Final    ABUNDANT WBC PRESENT, PREDOMINANTLY PMN MODERATE GRAM POSITIVE RODS MODERATE GRAM POSITIVE COCCI FEW GRAM NEGATIVE RODS    Culture   Final    MODERATE ENTEROCOCCUS FAECALIS MODERATE BACTEROIDES THETAIOTAOMICRON BETA LACTAMASE NEGATIVE Performed at [facility], [place] [address] [place], [place] [address]    Report Status PENDING  Incomplete   Organism ID, Bacteria ENTEROCOCCUS FAECALIS  Final      Susceptibility   Enterococcus faecalis - MIC*    AMPICILLIN <=2 SENSITIVE Sensitive     VANCOMYCIN 1 SENSITIVE Sensitive     GENTAMICIN SYNERGY RESISTANT Resistant     * MODERATE ENTEROCOCCUS FAECALIS  Surgical pcr screen     Status: Abnormal  Collection Time: 01/28/18  1:15 AM  Result Value Ref Range Status   MRSA, PCR POSITIVE (A) NEGATIVE Final    Comment: RESULT CALLED TO, READ BACK BY AND VERIFIED WITH: HARGROVE,C RN 0308 01/28/18 MITCHELL,L    Staphylococcus aureus POSITIVE (A) NEGATIVE Final    Comment: (NOTE) The Xpert SA Assay (FDA approved for NASAL specimens in patients 18 years of age and older), is one component of a comprehensive surveillance program. It is not intended to diagnose infection nor to guide or monitor treatment.   Aerobic/Anaerobic Culture (surgical/deep wound)     Status: None (Preliminary result)   Collection Time: 01/29/18  3:27 PM  Result Value Ref Range Status   Specimen Description ABSCESS LEFT THIGH  Final   Special Requests NONE  Final   Gram Stain   Final    ABUNDANT WBC PRESENT, PREDOMINANTLY PMN FEW GRAM POSITIVE COCCI RARE GRAM POSITIVE RODS    Culture   Final    CULTURE REINCUBATED FOR BETTER  GROWTH Performed at [facility], [place] [address], [place], [place] [address]    Report Status PENDING  Incomplete         Radiology Studies: US Guided Needle Placement  Result Date: 01/29/2018 INDICATION: 84 year old male with multi compartment deep intramuscular abscesses within the left hip and thigh. EXAM: 1. Ultrasound-guided drain placement 2. Ultrasound-guided aspiration MEDICATIONS: The patient is currently admitted to the hospital and receiving intravenous antibiotics. The antibiotics were administered within an appropriate time frame prior to the initiation of the procedure. ANESTHESIA/SEDATION: Fentanyl 1 mcg IV; Versed 50 mg IV Moderate Sedation Time:  33 minutes The patient was continuously monitored during the procedure by the interventional radiology nurse under my direct supervision. COMPLICATIONS: None immediate. PROCEDURE: Informed written consent was obtained from the patient after a thorough discussion of the procedural risks, benefits and alternatives. All questions were addressed. A timeout was performed prior to the initiation of the procedure. Ultrasound was used to interrogate the soft tissues of the left lower extremity. There is an elongated highly complex fluid collection within the musculature of the anterior compartment. Additionally, more heterogeneous and ill-defined complex fluid collections are present medial in the groin partially surrounding the femoral vasculature in the region of the adductor musculature. Multiple potential skin entry sites were selected and marked. The entire right thigh and groin were then sterilely prepped and draped in the standard fashion using chlorhexidine skin prep. Local anesthesia was attained by infiltration with 1% lidocaine. First, an attempt was made to place a drainage catheter into the large anterior compartment fluid collection. A small dermatotomy was made. Under real-time ultrasound guidance, an 18 gauge trocar needle was  successfully advanced into the fluid collection. A 0.035 wire was then coiled in the fluid collection. The skin tract was dilated to 10 [place] and a [place] all-purpose drainage catheter was advanced over the wire and formed. Aspiration yields approximately 50 mL of thick, purulent fluid. The ultrasound probe was used to milk the length of the anterior compartment abscess in till all aspirated will material was successfully collected. The catheter was then flushed and connected to JP bulb suction. Attention was next turned to the left groin in the region of the adductor musculature. Again, local anesthesia was attained by infiltration with 1% lidocaine and a small dermatotomy was made. This time, the 18 gauge needle was advanced into the more complex fluid collection under real-time sonographic guidance. The collection is too small for percutaneous drain placement. Therefore,  aspiration was performed. Approximately 10 mL of thick bloody purulent fluid was aspirated. Post aspiration ultrasound imaging was performed demonstrating significant reduction in the volume of the abscess collections. The sample of aspirated purulent fluid was sent for Gram stain and culture. IMPRESSION: 1. Successful placement of a 10 French drainage catheter into the left thigh anterior compartment abscess. 2. Technically successful aspiration of the visible portion of the adductor musculature abscesses. 3. Aspirated fluid sent for culture. Electronically Signed   By: Jacqulynn Cadet M.D.   On: 01/29/2018 17:24   US Guided Needle Placement  Result Date: 01/29/2018 INDICATION: 84 year old male with multi compartment deep intramuscular abscesses within the left hip and thigh. EXAM: 1. Ultrasound-guided drain placement 2. Ultrasound-guided aspiration MEDICATIONS: The patient is currently admitted to the hospital and receiving intravenous antibiotics. The antibiotics were administered within an appropriate time frame prior to the  initiation of the procedure. ANESTHESIA/SEDATION: Fentanyl 1 mcg IV; Versed 50 mg IV Moderate Sedation Time:  33 minutes The patient was continuously monitored during the procedure by the interventional radiology nurse under my direct supervision. COMPLICATIONS: None immediate. PROCEDURE: Informed written consent was obtained from the patient after a thorough discussion of the procedural risks, benefits and alternatives. All questions were addressed. A timeout was performed prior to the initiation of the procedure. Ultrasound was used to interrogate the soft tissues of the left lower extremity. There is an elongated highly complex fluid collection within the musculature of the anterior compartment. Additionally, more heterogeneous and ill-defined complex fluid collections are present medial in the groin partially surrounding the femoral vasculature in the region of the adductor musculature. Multiple potential skin entry sites were selected and marked. The entire right thigh and groin were then sterilely prepped and draped in the standard fashion using chlorhexidine skin prep. Local anesthesia was attained by infiltration with 1% lidocaine. First, an attempt was made to place a drainage catheter into the large anterior compartment fluid collection. A small dermatotomy was made. Under real-time ultrasound guidance, an 18 gauge trocar needle was successfully advanced into the fluid collection. A 0.035 wire was then coiled in the fluid collection. The skin tract was dilated to 10 [place] and a [place] all-purpose drainage catheter was advanced over the wire and formed. Aspiration yields approximately 50 mL of thick, purulent fluid. The ultrasound probe was used to milk the length of the anterior compartment abscess in till all aspirated will material was successfully collected. The catheter was then flushed and connected to JP bulb suction. Attention was next turned to the left groin in the region of the adductor  musculature. Again, local anesthesia was attained by infiltration with 1% lidocaine and a small dermatotomy was made. This time, the 18 gauge needle was advanced into the more complex fluid collection under real-time sonographic guidance. The collection is too small for percutaneous drain placement. Therefore, aspiration was performed. Approximately 10 mL of thick bloody purulent fluid was aspirated. Post aspiration ultrasound imaging was performed demonstrating significant reduction in the volume of the abscess collections. The sample of aspirated purulent fluid was sent for Gram stain and culture. IMPRESSION: 1. Successful placement of a 10 French drainage catheter into the left thigh anterior compartment abscess. 2. Technically successful aspiration of the visible portion of the adductor musculature abscesses. 3. Aspirated fluid sent for culture. Electronically Signed   By: Jacqulynn Cadet M.D.   On: 01/29/2018 17:24        Scheduled Meds: . aspirin EC  81 mg Oral Daily  .  Chlorhexidine Gluconate Cloth  6 each Topical Q0600  . enoxaparin (LOVENOX) injection  40 mg Subcutaneous Q24H  . feeding supplement (GLUCERNA SHAKE)  237 mL Oral BID BM  . insulin aspart  0-9 Units Subcutaneous TID WC  . insulin detemir  10 Units Subcutaneous QHS  . levothyroxine  25 mcg Oral Q0600  . mupirocin ointment  1 application Nasal BID  . povidone-iodine  2 application Topical Once  . pravastatin  10 mg Oral q1800  . sodium chloride flush  5 mL Intracatheter Q8H  . sodium chloride flush  5 mL Intracatheter Q8H   Continuous Infusions: . sodium chloride 10 mL/hr at 01/30/18 1700  . sodium chloride 50 mL/hr at 01/31/18 0936  . ampicillin-sulbactam (UNASYN) IV 3 g (01/31/18 0602)     LOS: 14 days     Georgette Shell, MD [facility]  If 7PM-7AM, please contact night-coverage [email or web address] Password [email or web address] 01/31/2018, 10:08 AM

## 2018-01-31 NOTE — Plan of Care (Signed)
  Problem: Nutrition: Goal: Adequate nutrition will be maintained Outcome: Progressing   Problem: Coping: Goal: Level of anxiety will decrease Outcome: Progressing   Problem: Elimination: Goal: Will not experience complications related to bowel motility Outcome: Progressing   Problem: Pain Managment: Goal: General experience of comfort will improve Outcome: Progressing   Problem: Safety: Goal: Ability to remain free from injury will improve Outcome: Progressing   

## 2018-02-01 ENCOUNTER — Inpatient Hospital Stay (HOSPITAL_COMMUNITY): Payer: Medicare Other

## 2018-02-01 ENCOUNTER — Other Ambulatory Visit (HOSPITAL_COMMUNITY): Payer: Medicare Other

## 2018-02-01 DIAGNOSIS — R600 Localized edema: Secondary | ICD-10-CM

## 2018-02-01 LAB — CBC
HCT: 24.7 % — ABNORMAL LOW (ref 39.0–52.0)
Hemoglobin: 7.3 g/dL — ABNORMAL LOW (ref 13.0–17.0)
MCH: 23.5 pg — ABNORMAL LOW (ref 26.0–34.0)
MCHC: 29.6 g/dL — ABNORMAL LOW (ref 30.0–36.0)
MCV: 79.4 fL — ABNORMAL LOW (ref 80.0–100.0)
Platelets: 946 10*3/uL (ref 150–400)
RBC: 3.11 MIL/uL — ABNORMAL LOW (ref 4.22–5.81)
RDW: 14.9 % (ref 11.5–15.5)
WBC: 17.6 10*3/uL — AB (ref 4.0–10.5)
nRBC: 0 % (ref 0.0–0.2)

## 2018-02-01 LAB — BASIC METABOLIC PANEL
Anion gap: 12 (ref 5–15)
BUN: 7 mg/dL — ABNORMAL LOW (ref 8–23)
CALCIUM: 7.3 mg/dL — AB (ref 8.9–10.3)
CO2: 24 mmol/L (ref 22–32)
Chloride: 98 mmol/L (ref 98–111)
Creatinine, Ser: 0.75 mg/dL (ref 0.61–1.24)
GFR calc Af Amer: >60 mL/min (ref >60)
GFR calc non Af Amer: >60 mL/min (ref >60)
Glucose, Bld: 168 mg/dL — ABNORMAL HIGH (ref 70–99)
Potassium: 2.8 mmol/L — ABNORMAL LOW (ref 3.5–5.1)
Sodium: 134 mmol/L — ABNORMAL LOW (ref 135–145)

## 2018-02-01 LAB — GLUCOSE, CAPILLARY
GLUCOSE-CAPILLARY: 88 mg/dL (ref 70–99)
Glucose-Capillary: 131 mg/dL — ABNORMAL HIGH (ref 70–99)
Glucose-Capillary: 229 mg/dL — ABNORMAL HIGH (ref 70–99)
Glucose-Capillary: 339 mg/dL — ABNORMAL HIGH (ref 70–99)

## 2018-02-01 MED ORDER — FUROSEMIDE 10 MG/ML IJ SOLN
20.0000 mg | Freq: Once | INTRAMUSCULAR | Status: AC
  Administered 2018-02-01: 20 mg via INTRAVENOUS
  Filled 2018-02-01: qty 2

## 2018-02-01 NOTE — Progress Notes (Signed)
PROGRESS NOTE    Anthony Dunlap  HWE:[id number]  DOB: Oct 27, 1933  DOA: 01/17/2018 PCP: [name], MD  Brief Narrative:  84 y.o.malewith medical history significant forhypothyroidism, insulin-dependent diabetes mellitus, hyperlipidemia, and history of prostate cancer with chronic Foley catheter, now presenting to the emergency department for evaluation of rigors. Patient reports that he was in his usual state of health until overnight when he began sweating and developed shaking chills. In the ED, pt was febrile with HR in 140s. He also had serum glucose of 440 with anion gap of 12. He was initially started on IVF and IV insulin. Patient was admitted for sepsis due topresumptive UTI initially. Urine culture was not revealing. Blood cultures grew peptostreptococcus and Bacteroides thetaiodomicron. 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 He was seen by IR who placed drains. Patient seen by ID who are recommending 6-8 weeks of IV antibiotics.   Subjective:  Patient resting comfortably, denies any acute complaints. Afebrile. Drain output noted through both right rectus sheath as well as left thigh drain.  Objective: Vitals:   01/31/18 0429 01/31/18 1403 01/31/18 2127 02/01/18 0514  BP: 130/70 119/61 131/72 (!) 143/76  Pulse: (!) 112 (!) 103 (!) 114 (!) 105  Resp: 19 18 19 19   Temp: 98.8 F (37.1 C) 99.5 F (37.5 C) 98.9 F (37.2 C) 99.7 F (37.6 C)  TempSrc: Oral Oral Oral Oral  SpO2: 96% 98% 98% 96%  Weight:      Height:        Intake/Output Summary (Last 24 hours) at 02/01/2018 1126 Last data filed at 02/01/2018 0900 Gross per 24 hour  Intake 1974.92 ml  Output 1355 ml  Net 619.92 ml   Filed Weights   01/17/18 0209 01/17/18 0600 01/23/18 2337  Weight: 70.3 kg 67.1 kg 72.9 kg    Physical Examination:  General exam: Appears calm and comfortable  Respiratory system: Clear to  auscultation. Respiratory effort normal. Cardiovascular system: S1 & S2 heard,tachycardic, loud heart sounds, ?mild diastolic murmur. 2+pitting pedal edema. Gastrointestinal system: Abdomen is nondistended, soft and nontender. Right rectus sheath drain in place with >66ml output. No organomegaly or masses felt. Normal bowel sounds heard. Central nervous system: Alert and oriented. No focal neurological deficits. Extremities: Symmetric 5 x 5 power. Dressing at left thigh abscess site with drain in place. Bilateral 2+pitting edema Skin: No rashes, lesions or ulcers Psychiatry: Judgement and insight appear normal. Mood & affect appropriate.     Data Reviewed: I have personally reviewed following labs and imaging studies  CBC: Recent Labs  Lab 01/27/18 0228 01/28/18 0159 01/30/18 0225 01/31/18 1041 02/01/18 0454  WBC 28.4* 26.5* 19.3* 27.9* 17.6*  NEUTROABS  --   --  16.0*  --   --   HGB 8.3* 8.1* 7.5* 8.4* 7.3*  HCT 28.0* 26.3* 26.0* 27.6* 24.7*  MCV 79.3* 78.7* 80.0 80.0 79.4*  PLT 884* 796* 883* 1,259* 893*   Basic Metabolic Panel: Recent Labs  Lab 01/27/18 0228 01/28/18 0159 01/29/18 0132 01/30/18 0225 01/31/18 0537 01/31/18 1041 02/01/18 0454  NA 129* 129*  --  133*  --  132* 134*  K 3.5 3.7  --  3.6  --  3.1* 2.8*  CL 94* 93*  --  96*  --  96* 98  CO2 25 25  --  27  --  21* 24  GLUCOSE 180* 301*  --  182*  --  205* 168*  BUN  10 11  --  8  --  8 7*  CREATININE 0.79 0.82 0.83 0.69 0.64 0.88 0.75  CALCIUM 7.2* 7.3*  --  7.3*  --  7.4* 7.3*   GFR: Estimated Creatinine Clearance: 68.9 mL/min (by C-G formula based on SCr of 0.75 mg/dL). Liver Function Tests: Recent Labs  Lab 01/30/18 0225 01/31/18 1041  AST 38 56*  ALT 31 31  ALKPHOS 249* 330*  BILITOT 0.3 0.8  PROT 5.7* 6.4*  ALBUMIN 1.4* 1.6*   No results for input(s): LIPASE, AMYLASE in the last 168 hours. No results for input(s): AMMONIA in the last 168 hours. Coagulation Profile: No results for  input(s): INR, PROTIME in the last 168 hours. Cardiac Enzymes: No results for input(s): CKTOTAL, CKMB, CKMBINDEX, TROPONINI in the last 168 hours. BNP (last 3 results) No results for input(s): PROBNP in the last 8760 hours. HbA1C: No results for input(s): HGBA1C in the last 72 hours. CBG: Recent Labs  Lab 01/31/18 0811 01/31/18 1205 01/31/18 1738 01/31/18 2204 02/01/18 0820  GLUCAP 167* 221* 315* 301* 88   Lipid Profile: No results for input(s): CHOL, HDL, LDLCALC, TRIG, CHOLHDL, LDLDIRECT in the last 72 hours. Thyroid Function Tests: No results for input(s): TSH, T4TOTAL, FREET4, T3FREE, THYROIDAB in the last 72 hours. Anemia Panel: No results for input(s): VITAMINB12, FOLATE, FERRITIN, TIBC, IRON, RETICCTPCT in the last 72 hours. Sepsis Labs: No results for input(s): PROCALCITON, LATICACIDVEN in the last 168 hours.  Recent Results (from the past 240 hour(s))  Aerobic/Anaerobic Culture (surgical/deep wound)     Status: None   Collection Time: 01/25/18 11:19 AM  Result Value Ref Range Status   Specimen Description ABSCESS  Final   Special Requests Normal  Final   Gram Stain   Final    ABUNDANT WBC PRESENT, PREDOMINANTLY PMN MODERATE GRAM POSITIVE RODS MODERATE GRAM POSITIVE COCCI FEW GRAM NEGATIVE RODS    Culture   Final    MODERATE ENTEROCOCCUS FAECALIS MODERATE BACTEROIDES THETAIOTAOMICRON BETA LACTAMASE NEGATIVE Performed at [facility], [place] [address], [place], [place] [address]    Report Status 01/31/2018 FINAL  Final   Organism ID, Bacteria ENTEROCOCCUS FAECALIS  Final      Susceptibility   Enterococcus faecalis - MIC*    AMPICILLIN <=2 SENSITIVE Sensitive     VANCOMYCIN 1 SENSITIVE Sensitive     GENTAMICIN SYNERGY RESISTANT Resistant     * MODERATE ENTEROCOCCUS FAECALIS  Surgical pcr screen     Status: Abnormal   Collection Time: 01/28/18  1:15 AM  Result Value Ref Range Status   MRSA, PCR POSITIVE (A) NEGATIVE Final    Comment: RESULT CALLED TO,  READ BACK BY AND VERIFIED WITH: HARGROVE,C RN 0308 01/28/18 MITCHELL,L    Staphylococcus aureus POSITIVE (A) NEGATIVE Final    Comment: (NOTE) The Xpert SA Assay (FDA approved for NASAL specimens in patients 26 years of age and older), is one component of a comprehensive surveillance program. It is not intended to diagnose infection nor to guide or monitor treatment.   Aerobic/Anaerobic Culture (surgical/deep wound)     Status: None (Preliminary result)   Collection Time: 01/29/18  3:27 PM  Result Value Ref Range Status   Specimen Description ABSCESS LEFT THIGH  Final   Special Requests NONE  Final   Gram Stain   Final    ABUNDANT WBC PRESENT, PREDOMINANTLY PMN FEW GRAM POSITIVE COCCI RARE GRAM POSITIVE RODS    Culture   Final    FEW ENTEROCOCCUS FAECALIS RARE  KLEBSIELLA PNEUMONIAE SUSCEPTIBILITIES TO FOLLOW Performed at [facility], [place] [address] [place], [place] [address]    Report Status PENDING  Incomplete      Radiology Studies: US Abdomen Limited Ruq  Result Date: 02/01/2018 CLINICAL DATA:  Abnormal liver function tests. EXAM: ULTRASOUND ABDOMEN LIMITED RIGHT UPPER QUADRANT COMPARISON:  CT scan of January 26, 2018. FINDINGS: Gallbladder: No gallstones or wall thickening visualized. No sonographic Murphy sign noted by sonographer. Common bile duct: Diameter: 3 mm which is within normal limits. Liver: No focal lesion identified. Within normal limits in parenchymal echogenicity. Portal vein is patent on color Doppler imaging with normal direction of blood flow towards the liver. IMPRESSION: No definite abnormality seen in the right upper quadrant of the abdomen. Electronically Signed   By: Marijo Conception, M.D.   On: 02/01/2018 10:50        Scheduled Meds: . sodium chloride   Intravenous Once  . aspirin  325 mg Oral Daily  . enoxaparin (LOVENOX) injection  40 mg Subcutaneous Q24H  . feeding supplement (GLUCERNA SHAKE)  237 mL Oral BID BM  . insulin aspart   0-9 Units Subcutaneous TID WC  . insulin detemir  15 Units Subcutaneous QHS  . levothyroxine  25 mcg Oral Q0600  . mupirocin ointment  1 application Nasal BID  . povidone-iodine  2 application Topical Once  . pravastatin  10 mg Oral q1800  . sodium chloride flush  5 mL Intracatheter Q8H  . sodium chloride flush  5 mL Intracatheter Q8H   Continuous Infusions: . sodium chloride Stopped (01/31/18 1800)  . sodium chloride 50 mL/hr at 02/01/18 0300  . ampicillin-sulbactam (UNASYN) IV 3 g (02/01/18 1019)    Assessment & Plan:    1. 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 Appreciate ID evaluation. Continue IV Unasyn for 6 weeks. Leucocytosis down trending. Still has purulent drainage in pig tail catheter. Will send repeat blood cx. Echo showed nl EF and mildly calcified aortic/mitral valve on 12/5. Send repeat blood cx. Follow up CT early next week   2. DKA: POA. Now resolved and off Insulin drip. Transitioned to Levemir/ SSI  3. Hypothyroidism: Synthroid  4. Prostate cancer -has chronic foley  5. Reactive Thrombocytosis-on aspirin to 325 mg.  6. Hyperlipidemia: Statins  7. Leg edema: d/c IV fluids as eating and BP stable. Lasix x 1  DVT prophylaxis: Lovenox Code Status: Full code Family / Patient Communication:  D/w patient and explained plan. All questions answered Disposition Plan: Will likely need rehab     LOS: 15 days    Time spent: 35 minutes    Guilford Shi, MD [facility] Pager [phone]  If 7PM-7AM, please contact night-coverage [email or web address] Password [email or web address] 02/01/2018, 11:26 AM

## 2018-02-01 NOTE — Plan of Care (Signed)

## 2018-02-02 LAB — BASIC METABOLIC PANEL
Anion gap: 12 (ref 5–15)
Anion gap: 14 (ref 5–15)
BUN: 7 mg/dL — ABNORMAL LOW (ref 8–23)
BUN: 7 mg/dL — AB (ref 8–23)
CO2: 26 mmol/L (ref 22–32)
CO2: 23 mmol/L (ref 22–32)
Calcium: 7.2 mg/dL — ABNORMAL LOW (ref 8.9–10.3)
Calcium: 7.2 mg/dL — ABNORMAL LOW (ref 8.9–10.3)
Chloride: 94 mmol/L — ABNORMAL LOW (ref 98–111)
Chloride: 95 mmol/L — ABNORMAL LOW (ref 98–111)
Creatinine, Ser: 0.71 mg/dL (ref 0.61–1.24)
Creatinine, Ser: 0.65 mg/dL (ref 0.61–1.24)
GFR calc Af Amer: >60 mL/min (ref >60)
GFR calc non Af Amer: >60 mL/min (ref >60)
GFR calc non Af Amer: >60 mL/min (ref >60)
Glucose, Bld: 145 mg/dL — ABNORMAL HIGH (ref 70–99)
Glucose, Bld: 182 mg/dL — ABNORMAL HIGH (ref 70–99)
Potassium: 2.5 mmol/L — CL (ref 3.5–5.1)
Potassium: 3 mmol/L — ABNORMAL LOW (ref 3.5–5.1)
Sodium: 132 mmol/L — ABNORMAL LOW (ref 135–145)
Sodium: 132 mmol/L — ABNORMAL LOW (ref 135–145)

## 2018-02-02 LAB — CBC
HCT: 24.4 % — ABNORMAL LOW (ref 39.0–52.0)
Hemoglobin: 7.2 g/dL — ABNORMAL LOW (ref 13.0–17.0)
MCH: 23.4 pg — ABNORMAL LOW (ref 26.0–34.0)
MCHC: 29.5 g/dL — ABNORMAL LOW (ref 30.0–36.0)
MCV: 79.2 fL — ABNORMAL LOW (ref 80.0–100.0)
Platelets: 879 10*3/uL — ABNORMAL HIGH (ref 150–400)
RBC: 3.08 MIL/uL — ABNORMAL LOW (ref 4.22–5.81)
RDW: 14.9 % (ref 11.5–15.5)
WBC: 16.9 10*3/uL — AB (ref 4.0–10.5)
nRBC: 0 % (ref 0.0–0.2)

## 2018-02-02 LAB — GLUCOSE, CAPILLARY
Glucose-Capillary: 181 mg/dL — ABNORMAL HIGH (ref 70–99)
Glucose-Capillary: 199 mg/dL — ABNORMAL HIGH (ref 70–99)

## 2018-02-02 MED ORDER — POTASSIUM CHLORIDE CRYS ER 20 MEQ PO TBCR
40.0000 meq | EXTENDED_RELEASE_TABLET | Freq: Two times a day (BID) | ORAL | Status: AC
  Administered 2018-02-02 (×2): 40 meq via ORAL
  Filled 2018-02-02 (×2): qty 2

## 2018-02-02 NOTE — Progress Notes (Signed)
PROGRESS NOTE    Anthony Dunlap  FKC:[id number]  DOB: 1934-01-25  DOA: 01/17/2018 PCP: [name], MD  Brief Narrative:  84 y.o.malewith medical history significant forhypothyroidism, insulin-dependent diabetes mellitus, hyperlipidemia, and history of prostate cancer with chronic Foley catheter, now presenting to the emergency department for evaluation of rigors. Patient reports that he was in his usual state of health until overnight when he began sweating and developed shaking chills. In the ED, pt was febrile with HR in 140s. He also had serum glucose of 440 with anion gap of 12. He was initially started on IVF and IV insulin. Patient was admitted for sepsis due topresumptive UTI initially. Urine culture was not revealing. Blood cultures grew peptostreptococcus and Bacteroides thetaiodomicron. 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 He was seen by IR who placed drains. Patient seen by ID who are recommending 6-8 weeks of IV antibiotics.   Subjective:  Patient resting in bed. Denies any acute complaints. Afebrile. Drain output noted through both right rectus sheath as well as left thigh drain.  Potassium was low this morning.  Replaced.  Objective: Vitals:   02/01/18 0514 02/01/18 1300 02/01/18 2146 02/02/18 0509  BP: (!) 143/76 138/78 (!) 146/90 137/69  Pulse: (!) 105 100 (!) 109 (!) 102  Resp: 19 18 16 16   Temp: 99.7 F (37.6 C) 98.6 F (37 C) 99.9 F (37.7 C) 99.6 F (37.6 C)  TempSrc: Oral Oral Oral Oral  SpO2: 96% 98% 97% 99%  Weight:      Height:        Intake/Output Summary (Last 24 hours) at 02/02/2018 1317 Last data filed at 02/02/2018 1159 Gross per 24 hour  Intake 20 ml  Output 2462 ml  Net -2442 ml   Filed Weights   01/17/18 0209 01/17/18 0600 01/23/18 2337  Weight: 70.3 kg 67.1 kg 72.9 kg    Physical Examination:  General exam: Appears calm and comfortable    Respiratory system: Clear to auscultation. Respiratory effort normal. Cardiovascular system: S1 & S2 heard, tachycardic, ?mild diastolic murmur. Lower extremity edema. Gastrointestinal system: Abdomen is nondistended, soft and nontender. Right rectus sheath drain in place. No organomegaly or masses felt. Normal bowel sounds heard. Central nervous system: Alert and oriented. No focal neurological deficits. Extremities: Symmetric 5 x 5 power. Dressing at left thigh abscess site with drain in place. Bilateral 2+pitting edema Skin: No rashes, lesions or ulcers Psychiatry: Judgement and insight appear normal. Mood & affect appropriate.     Data Reviewed: I have personally reviewed following labs and imaging studies  CBC: Recent Labs  Lab 01/28/18 0159 01/30/18 0225 01/31/18 1041 02/01/18 0454 02/02/18 0232  WBC 26.5* 19.3* 27.9* 17.6* 16.9*  NEUTROABS  --  16.0*  --   --   --   HGB 8.1* 7.5* 8.4* 7.3* 7.2*  HCT 26.3* 26.0* 27.6* 24.7* 24.4*  MCV 78.7* 80.0 80.0 79.4* 79.2*  PLT 796* 883* 1,259* 946* 749*   Basic Metabolic Panel: Recent Labs  Lab 01/28/18 0159  01/30/18 0225 01/31/18 0537 01/31/18 1041 02/01/18 0454 02/02/18 0232  NA 129*  --  133*  --  132* 134* 132*  K 3.7  --  3.6  --  3.1* 2.8* 2.5*  CL 93*  --  96*  --  96* 98 94*  CO2 25  --  27  --  21* 24 26  GLUCOSE 301*  --  182*  --  205*  168* 145*  BUN 11  --  8  --  8 7* 7*  CREATININE 0.82   < > 0.69 0.64 0.88 0.75 0.71  CALCIUM 7.3*  --  7.3*  --  7.4* 7.3* 7.2*   < > = values in this interval not displayed.   GFR: Estimated Creatinine Clearance: 68.9 mL/min (by C-G formula based on SCr of 0.71 mg/dL). Liver Function Tests: Recent Labs  Lab 01/30/18 0225 01/31/18 1041  AST 38 56*  ALT 31 31  ALKPHOS 249* 330*  BILITOT 0.3 0.8  PROT 5.7* 6.4*  ALBUMIN 1.4* 1.6*   No results for input(s): LIPASE, AMYLASE in the last 168 hours. No results for input(s): AMMONIA in the last 168 hours. Coagulation  Profile: No results for input(s): INR, PROTIME in the last 168 hours. Cardiac Enzymes: No results for input(s): CKTOTAL, CKMB, CKMBINDEX, TROPONINI in the last 168 hours. BNP (last 3 results) No results for input(s): PROBNP in the last 8760 hours. HbA1C: No results for input(s): HGBA1C in the last 72 hours. CBG: Recent Labs  Lab 02/01/18 1150 02/01/18 1729 02/01/18 2130 02/02/18 0901 02/02/18 1235  GLUCAP 131* 229* 339* 181* 199*   Lipid Profile: No results for input(s): CHOL, HDL, LDLCALC, TRIG, CHOLHDL, LDLDIRECT in the last 72 hours. Thyroid Function Tests: No results for input(s): TSH, T4TOTAL, FREET4, T3FREE, THYROIDAB in the last 72 hours. Anemia Panel: No results for input(s): VITAMINB12, FOLATE, FERRITIN, TIBC, IRON, RETICCTPCT in the last 72 hours. Sepsis Labs: No results for input(s): PROCALCITON, LATICACIDVEN in the last 168 hours.  Recent Results (from the past 240 hour(s))  Aerobic/Anaerobic Culture (surgical/deep wound)     Status: None   Collection Time: 01/25/18 11:19 AM  Result Value Ref Range Status   Specimen Description ABSCESS  Final   Special Requests Normal  Final   Gram Stain   Final    ABUNDANT WBC PRESENT, PREDOMINANTLY PMN MODERATE GRAM POSITIVE RODS MODERATE GRAM POSITIVE COCCI FEW GRAM NEGATIVE RODS    Culture   Final    MODERATE ENTEROCOCCUS FAECALIS MODERATE BACTEROIDES THETAIOTAOMICRON BETA LACTAMASE NEGATIVE Performed at [facility], [place] [address], [place], [place] [address]    Report Status 01/31/2018 FINAL  Final   Organism ID, Bacteria ENTEROCOCCUS FAECALIS  Final      Susceptibility   Enterococcus faecalis - MIC*    AMPICILLIN <=2 SENSITIVE Sensitive     VANCOMYCIN 1 SENSITIVE Sensitive     GENTAMICIN SYNERGY RESISTANT Resistant     * MODERATE ENTEROCOCCUS FAECALIS  Surgical pcr screen     Status: Abnormal   Collection Time: 01/28/18  1:15 AM  Result Value Ref Range Status   MRSA, PCR POSITIVE (A) NEGATIVE Final     Comment: RESULT CALLED TO, READ BACK BY AND VERIFIED WITH: HARGROVE,C RN 0308 01/28/18 MITCHELL,L    Staphylococcus aureus POSITIVE (A) NEGATIVE Final    Comment: (NOTE) The Xpert SA Assay (FDA approved for NASAL specimens in patients 59 years of age and older), is one component of a comprehensive surveillance program. It is not intended to diagnose infection nor to guide or monitor treatment.   Aerobic/Anaerobic Culture (surgical/deep wound)     Status: None (Preliminary result)   Collection Time: 01/29/18  3:27 PM  Result Value Ref Range Status   Specimen Description ABSCESS LEFT THIGH  Final   Special Requests NONE  Final   Gram Stain   Final    ABUNDANT WBC PRESENT, PREDOMINANTLY PMN FEW GRAM POSITIVE COCCI  RARE GRAM POSITIVE RODS Performed at [facility], [place] [address] [place], [place] [address]    Culture   Final    FEW ENTEROCOCCUS FAECALIS RARE KLEBSIELLA PNEUMONIAE NO ANAEROBES ISOLATED; CULTURE IN PROGRESS FOR 5 DAYS    Report Status PENDING  Incomplete   Organism ID, Bacteria ENTEROCOCCUS FAECALIS  Final   Organism ID, Bacteria KLEBSIELLA PNEUMONIAE  Final      Susceptibility   Enterococcus faecalis - MIC*    AMPICILLIN <=2 SENSITIVE Sensitive     VANCOMYCIN 1 SENSITIVE Sensitive     GENTAMICIN SYNERGY RESISTANT Resistant     * FEW ENTEROCOCCUS FAECALIS   Klebsiella pneumoniae - MIC*    AMPICILLIN >=32 RESISTANT Resistant     CEFAZOLIN <=4 SENSITIVE Sensitive     CEFEPIME <=1 SENSITIVE Sensitive     CEFTAZIDIME <=1 SENSITIVE Sensitive     CEFTRIAXONE <=1 SENSITIVE Sensitive     CIPROFLOXACIN <=0.25 SENSITIVE Sensitive     GENTAMICIN <=1 SENSITIVE Sensitive     IMIPENEM <=0.25 SENSITIVE Sensitive     TRIMETH/SULFA <=20 SENSITIVE Sensitive     AMPICILLIN/SULBACTAM 8 SENSITIVE Sensitive     PIP/TAZO <=4 SENSITIVE Sensitive     Extended ESBL NEGATIVE Sensitive     * RARE KLEBSIELLA PNEUMONIAE      Radiology Studies: US Abdomen Limited Ruq  Result  Date: 02/01/2018 CLINICAL DATA:  Abnormal liver function tests. EXAM: ULTRASOUND ABDOMEN LIMITED RIGHT UPPER QUADRANT COMPARISON:  CT scan of January 26, 2018. FINDINGS: Gallbladder: No gallstones or wall thickening visualized. No sonographic Murphy sign noted by sonographer. Common bile duct: Diameter: 3 mm which is within normal limits. Liver: No focal lesion identified. Within normal limits in parenchymal echogenicity. Portal vein is patent on color Doppler imaging with normal direction of blood flow towards the liver. IMPRESSION: No definite abnormality seen in the right upper quadrant of the abdomen. Electronically Signed   By: Marijo Conception, M.D.   On: 02/01/2018 10:50        Scheduled Meds: . sodium chloride   Intravenous Once  . aspirin  325 mg Oral Daily  . enoxaparin (LOVENOX) injection  40 mg Subcutaneous Q24H  . feeding supplement (GLUCERNA SHAKE)  237 mL Oral BID BM  . insulin aspart  0-9 Units Subcutaneous TID WC  . insulin detemir  15 Units Subcutaneous QHS  . levothyroxine  25 mcg Oral Q0600  . potassium chloride  40 mEq Oral BID  . povidone-iodine  2 application Topical Once  . pravastatin  10 mg Oral q1800  . sodium chloride flush  5 mL Intracatheter Q8H  . sodium chloride flush  5 mL Intracatheter Q8H   Continuous Infusions: . sodium chloride Stopped (01/31/18 1800)  . ampicillin-sulbactam (UNASYN) IV 3 g (02/02/18 1001)    Assessment & Plan:    1. 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 Appreciate ID evaluation. Continue IV Unasyn for 6 weeks. Leucocytosis down trending. Still has purulent drainage in pig tail catheter. Will send repeat blood cx. Echo showed nl EF and mildly calcified aortic/mitral valve on 12/5. Send repeat blood cx. Follow up CT early next week  02/02/18: -Blood cultures from 02/01/2018 sent.  Had some low-grade fever but denies  having any complaints at this time.  WBC count still high but trending down.  2. DKA: POA. Now resolved and off Insulin drip. Transitioned to Levemir/ SSI 02/02/18: -Continue basal insulin,  Accu-Cheks, insulin sliding scale.  3. Hypothyroidism: Synthroid  4. Prostate cancer -has chronic foley  5. Reactive Thrombocytosis-on aspirin to 325 mg.  6. Hyperlipidemia: Statins  7. Leg edema: d/c IV fluids as eating and BP stable. Lasix x 1 given for lower extremity edema.  Hypokalemia -Potassium today was low.  Replacement ordered. -Continue to monitor electrolytes and replace as needed.   DVT prophylaxis: Lovenox Code Status: Full code Family / Patient Communication:  D/w patient and explained plan. All questions answered Disposition Plan: Will likely need rehab     LOS: 16 days    Time spent: 25 minutes    Yaakov Guthrie, MD [facility] Pager on [place]  If 7PM-7AM, please contact night-coverage [email or web address] Password [email or web address] 02/02/2018, 1:17 PM

## 2018-02-02 NOTE — Progress Notes (Signed)
Referring Physician(s): Matthews,E  Supervising Physician: Sandi Mariscal  Patient Status:  [facility] - In-pt  Chief Complaint: Abdominal/left thigh abscesses   Subjective: Patient without acute changes.  Denies worsening abdominal or lower extremity pain   Allergies: Patient has no known allergies.  Medications: Prior to Admission medications   Medication Sig Start Date End Date Taking? Authorizing Provider  aspirin EC 81 MG tablet Take 81 mg by mouth daily.   Yes [provider]  LEVEMIR FLEXTOUCH 100 UNIT/ML Pen Inject 30 Units into the skin daily. 01/07/18  Yes Kathie Dike, MD  levothyroxine (SYNTHROID, LEVOTHROID) 25 MCG tablet Take 25 mcg by mouth daily before breakfast.   Yes [provider]  pravastatin (PRAVACHOL) 10 MG tablet Take 10 mg by mouth daily.   Yes [provider]     Vital Signs: BP 134/68 (BP Location: Right Arm)   Pulse (!) 102   Temp 98.9 F (37.2 C) (Oral)   Resp 20   Ht 5\' 8"  (1.727 m)   Wt 160 lb 11.5 oz (72.9 kg)   SpO2 98%   BMI 24.44 kg/m   Physical Exam awake, alert.  Left thigh drain output 90 cc, right abdominal drain output 10 cc; both drains intact, insertion sites okay, dressings dry; beige-colored fluid in drains  Imaging: US Abdomen Limited Ruq  Result Date: 02/01/2018 CLINICAL DATA:  Abnormal liver function tests. EXAM: ULTRASOUND ABDOMEN LIMITED RIGHT UPPER QUADRANT COMPARISON:  CT scan of January 26, 2018. FINDINGS: Gallbladder: No gallstones or wall thickening visualized. No sonographic Murphy sign noted by sonographer. Common bile duct: Diameter: 3 mm which is within normal limits. Liver: No focal lesion identified. Within normal limits in parenchymal echogenicity. Portal vein is patent on color Doppler imaging with normal direction of blood flow towards the liver. IMPRESSION: No definite abnormality seen in the right upper quadrant of the abdomen. Electronically Signed   By: Marijo Conception, M.D.    On: 02/01/2018 10:50    Labs:  CBC: Recent Labs    01/30/18 0225 01/31/18 1041 02/01/18 0454 02/02/18 0232  WBC 19.3* 27.9* 17.6* 16.9*  HGB 7.5* 8.4* 7.3* 7.2*  HCT 26.0* 27.6* 24.7* 24.4*  PLT 883* 1,259* 946* 879*    COAGS: Recent Labs    01/03/18 1929 01/17/18 0443 01/25/18 0348  INR 1.20 1.31 1.15  APTT  --  37*  --     BMP: Recent Labs    01/30/18 0225 01/31/18 0537 01/31/18 1041 02/01/18 0454 02/02/18 0232  NA 133*  --  132* 134* 132*  K 3.6  --  3.1* 2.8* 2.5*  CL 96*  --  96* 98 94*  CO2 27  --  21* 24 26  GLUCOSE 182*  --  205* 168* 145*  BUN 8  --  8 7* 7*  CALCIUM 7.3*  --  7.4* 7.3* 7.2*  CREATININE 0.69 0.64 0.88 0.75 0.71  GFRNONAA >60 >60 >60 >60 >60  GFRAA >60 >60 >60 >60 >60    LIVER FUNCTION TESTS: Recent Labs    01/05/18 0551 01/17/18 0237 01/30/18 0225 01/31/18 1041  BILITOT 0.3 0.7 0.3 0.8  AST 21 19 38 56*  ALT 19 15 31 31   ALKPHOS 141* 152* 249* 330*  PROT 7.1 7.2 5.7* 6.4*  ALBUMIN 2.6* 2.5* 1.4* 1.6*    Assessment and Plan: Pt s/p drainage of right rectus sheath abscess 12/7, left thigh abscess 12/11 along with aspiration of additional adductor musculature abscesses; afebrile, WBC 16.9 (17.6),  hgb 7.2(7.3), K 2.5- replace; creat nl; drain fluid cultures with enterococcus and Klebsiella; recommend follow-up CT A/P to include left thigh region next 48-72 hrs   Electronically Signed: D. Rowe Robert, PA-C 02/02/2018, 4:17 PM   I spent a total of 15 minutes at the the patient's bedside AND on the patient's hospital floor or unit, greater than 50% of which was counseling/coordinating care for right abdominal and left thigh abscess drains    Patient ID: Anthony Dunlap, male   DOB: 16-Feb-1934, 84 y.o.   MRN: [id number]

## 2018-02-02 NOTE — Plan of Care (Signed)
?  Problem: Activity: ?Goal: Risk for activity intolerance will decrease ?Outcome: Progressing ?  ?Problem: Nutrition: ?Goal: Adequate nutrition will be maintained ?Outcome: Progressing ?  ?Problem: Elimination: ?Goal: Will not experience complications related to urinary retention ?Outcome: Progressing ?  ?Problem: Pain Managment: ?Goal: General experience of comfort will improve ?Outcome: Progressing ?  ?Problem: Safety: ?Goal: Ability to remain free from injury will improve ?Outcome: Progressing ?  ?Problem: Skin Integrity: ?Goal: Risk for impaired skin integrity will decrease ?Outcome: Progressing ?  ?

## 2018-02-02 NOTE — Progress Notes (Signed)
Call received from team member Shawn from the lab.  He reports that patient's potassium level is 2.5.  I have informed provider.

## 2018-02-03 DIAGNOSIS — B961 Klebsiella pneumoniae [K. pneumoniae] as the cause of diseases classified elsewhere: Secondary | ICD-10-CM

## 2018-02-03 LAB — AEROBIC/ANAEROBIC CULTURE W GRAM STAIN (SURGICAL/DEEP WOUND)

## 2018-02-03 LAB — CBC
HCT: 26.2 % — ABNORMAL LOW (ref 39.0–52.0)
HEMOGLOBIN: 7.7 g/dL — AB (ref 13.0–17.0)
MCH: 23.4 pg — ABNORMAL LOW (ref 26.0–34.0)
MCHC: 29.4 g/dL — ABNORMAL LOW (ref 30.0–36.0)
MCV: 79.6 fL — ABNORMAL LOW (ref 80.0–100.0)
NRBC: 0 % (ref 0.0–0.2)
Platelets: 940 10*3/uL (ref 150–400)
RBC: 3.29 MIL/uL — AB (ref 4.22–5.81)
RDW: 14.9 % (ref 11.5–15.5)
WBC: 17.9 10*3/uL — ABNORMAL HIGH (ref 4.0–10.5)

## 2018-02-03 LAB — GLUCOSE, CAPILLARY
GLUCOSE-CAPILLARY: 381 mg/dL — AB (ref 70–99)
GLUCOSE-CAPILLARY: 304 mg/dL — AB (ref 70–99)
Glucose-Capillary: 174 mg/dL — ABNORMAL HIGH (ref 70–99)
Glucose-Capillary: 339 mg/dL — ABNORMAL HIGH (ref 70–99)
Glucose-Capillary: 192 mg/dL — ABNORMAL HIGH (ref 70–99)
Glucose-Capillary: 197 mg/dL — ABNORMAL HIGH (ref 70–99)

## 2018-02-03 LAB — BASIC METABOLIC PANEL
Anion gap: 13 (ref 5–15)
BUN: 8 mg/dL (ref 8–23)
CHLORIDE: 94 mmol/L — AB (ref 98–111)
CO2: 26 mmol/L (ref 22–32)
Calcium: 7.5 mg/dL — ABNORMAL LOW (ref 8.9–10.3)
Creatinine, Ser: 0.78 mg/dL (ref 0.61–1.24)
GFR calc Af Amer: >60 mL/min (ref >60)
GFR calc non Af Amer: >60 mL/min (ref >60)
Glucose, Bld: 181 mg/dL — ABNORMAL HIGH (ref 70–99)
Potassium: 3.4 mmol/L — ABNORMAL LOW (ref 3.5–5.1)
Sodium: 133 mmol/L — ABNORMAL LOW (ref 135–145)

## 2018-02-03 MED ORDER — INSULIN DETEMIR 100 UNIT/ML ~~LOC~~ SOLN
20.0000 [IU] | Freq: Every day | SUBCUTANEOUS | Status: DC
  Administered 2018-02-03 – 2018-02-05 (×3): 20 [IU] via SUBCUTANEOUS
  Filled 2018-02-03 (×3): qty 0.2

## 2018-02-03 NOTE — Progress Notes (Signed)
Subjective: No new complaints   Antibiotics:  Anti-infectives (From admission, onward)   Start     Dose/Rate Route Frequency Ordered Stop   01/25/18 1700  Ampicillin-Sulbactam (UNASYN) 3 g in sodium chloride 0.9 % 100 mL IVPB     3 g 200 mL/hr over 30 Minutes Intravenous Every 6 hours 01/25/18 1604     01/23/18 2200  vancomycin (VANCOCIN) IVPB 1000 mg/200 mL premix  Status:  Discontinued     1,000 mg 200 mL/hr over 60 Minutes Intravenous Every 24 hours 01/23/18 0817 01/24/18 1002   01/23/18 0100  piperacillin-tazobactam (ZOSYN) IVPB 3.375 g  Status:  Discontinued     3.375 g 12.5 mL/hr over 240 Minutes Intravenous Every 8 hours 01/23/18 0057 01/25/18 1549   01/23/18 0100  vancomycin (VANCOCIN) IVPB 1000 mg/200 mL premix     1,000 mg 200 mL/hr over 60 Minutes Intravenous  Once 01/23/18 0057 01/23/18 0214   01/22/18 1630  meropenem (MERREM) 1 g in sodium chloride 0.9 % 100 mL IVPB  Status:  Discontinued     1 g 200 mL/hr over 30 Minutes Intravenous Every 8 hours 01/22/18 1625 01/22/18 1626   01/22/18 1630  metroNIDAZOLE (FLAGYL) IVPB 500 mg  Status:  Discontinued     500 mg 100 mL/hr over 60 Minutes Intravenous Every 8 hours 01/22/18 1626 01/23/18 0028   01/21/18 1200  penicillin G 3 million units in sodium chloride 0.9% 100 mL IVPB  Status:  Discontinued     3 Million Units 200 mL/hr over 30 Minutes Intravenous Every 4 hours 01/21/18 1050 01/21/18 1100   01/21/18 1200  penicillin G potassium 3 Million Units in dextrose 35mL IVPB  Status:  Discontinued     3 Million Units 100 mL/hr over 30 Minutes Intravenous Every 4 hours 01/21/18 1100 01/21/18 1106   01/21/18 1200  penicillin G 3 million units in sodium chloride 0.9% 100 mL IVPB  Status:  Discontinued     3 Million Units 200 mL/hr over 30 Minutes Intravenous Every 4 hours 01/21/18 1106 01/22/18 1625   01/19/18 1400  ceFAZolin (ANCEF) IVPB 1 g/50 mL premix  Status:  Discontinued     1 g 100 mL/hr over 30 Minutes  Intravenous Every 8 hours 01/19/18 1104 01/21/18 1050   01/17/18 1200  ceFEPIme (MAXIPIME) 1 g in sodium chloride 0.9 % 100 mL IVPB  Status:  Discontinued     1 g 200 mL/hr over 30 Minutes Intravenous Every 12 hours 01/17/18 0741 01/19/18 1104   01/17/18 0230  ceFEPIme (MAXIPIME) 2 g in sodium chloride 0.9 % 100 mL IVPB     2 g 200 mL/hr over 30 Minutes Intravenous  Once 01/17/18 0224 01/17/18 0400      Medications: Scheduled Meds: . sodium chloride   Intravenous Once  . aspirin  325 mg Oral Daily  . enoxaparin (LOVENOX) injection  40 mg Subcutaneous Q24H  . feeding supplement (GLUCERNA SHAKE)  237 mL Oral BID BM  . insulin aspart  0-9 Units Subcutaneous TID WC  . insulin detemir  15 Units Subcutaneous QHS  . levothyroxine  25 mcg Oral Q0600  . povidone-iodine  2 application Topical Once  . pravastatin  10 mg Oral q1800  . sodium chloride flush  5 mL Intracatheter Q8H  . sodium chloride flush  5 mL Intracatheter Q8H   Continuous Infusions: . sodium chloride Stopped (01/31/18 1800)  . ampicillin-sulbactam (UNASYN) IV 3 g (02/03/18 1012)   PRN Meds:.sodium  chloride, acetaminophen, ibuprofen, ondansetron (ZOFRAN) IV    Objective: Weight change:   Intake/Output Summary (Last 24 hours) at 02/03/2018 1222 Last data filed at 02/03/2018 1200 Gross per 24 hour  Intake 250 ml  Output 1473 ml  Net -1223 ml   Blood pressure (!) 144/98, pulse (!) 106, temperature 99.6 F (37.6 C), temperature source Oral, resp. rate 20, height 5\' 8"  (1.727 m), weight 72.9 kg, SpO2 97 %. Temp:  [98.9 F (37.2 C)-99.6 F (37.6 C)] 99.6 F (37.6 C) (12/16 0340) Pulse Rate:  [102-107] 106 (12/16 0340) Resp:  [20] 20 (12/16 0340) BP: (134-144)/(68-98) 144/98 (12/16 0340) SpO2:  [95 %-98 %] 97 % (12/16 0340)  Physical Exam: General: Alert and awake, oriented HEENT: anicteric sclera, EOMI CVS regular rate, normal  Chest: , no wheezing, no respiratory distress Abdomen: In place on right side also  drain in left thigh with copious purulent material Skin: no rashes Neuro: nonfocal  CBC:    BMET Recent Labs    02/02/18 1816 02/03/18 0610  NA 132* 133*  K 3.0* 3.4*  CL 95* 94*  CO2 23 26  GLUCOSE 182* 181*  BUN 7* 8  CREATININE 0.65 0.78  CALCIUM 7.2* 7.5*     Liver Panel  No results for input(s): PROT, ALBUMIN, AST, ALT, ALKPHOS, BILITOT, BILIDIR, IBILI in the last 72 hours.     Sedimentation Rate No results for input(s): ESRSEDRATE in the last 72 hours. C-Reactive Protein No results for input(s): CRP in the last 72 hours.  Micro Results: Recent Results (from the past 720 hour(s))  Urine culture     Status: Abnormal   Collection Time: 01/17/18  2:23 AM  Result Value Ref Range Status   Specimen Description   Final    URINE, CATHETERIZED Performed at [facility], [address], [place], [place] [address]    Special Requests   Final    Normal Performed at [facility], [address], [place], [place] [address]    Culture MULTIPLE SPECIES PRESENT, SUGGEST RECOLLECTION (A)  Final   Report Status 01/18/2018 FINAL  Final  Blood Culture (routine x 2)     Status: Abnormal   Collection Time: 01/17/18  2:40 AM  Result Value Ref Range Status   Specimen Description   Final    BLOOD RIGHT FOREARM Performed at [facility], [address] [place], [place] [address]    Special Requests   Final    BOTTLES DRAWN AEROBIC AND ANAEROBIC Blood Culture results may not be optimal due to an excessive volume of blood received in culture bottles Performed at [place] [place], [place] [address]    Culture  Setup Time   Final    GRAM POSITIVE COCCI ANAEROBIC BOTTLE Gram Stain Report Called to,Read Back By and Verified With: HYATT,L@1344  BY MATTHEWS,B 11.30.19 [facility] Organism ID to follow CRITICAL RESULT CALLED TO, READ BACK BY AND VERIFIED WITH: Cletis Media RN [id number] BY GF    Culture (A)  Final    PEPTOSTREPTOCOCCUS  ASACCHAROLYTICUS BACTEROIDES THETAIOTAOMICRON BETA LACTAMASE POSITIVE Performed at [facility], [place] [address], [place], [place] [address]    Report Status 01/22/2018 FINAL  Final  Blood Culture ID Panel (Reflexed)     Status: None   Collection Time: 01/17/18  2:40 AM  Result Value Ref Range Status   Enterococcus species NOT DETECTED NOT DETECTED Final   Listeria monocytogenes NOT DETECTED NOT DETECTED Final   Staphylococcus species NOT  DETECTED NOT DETECTED Final   Staphylococcus aureus (BCID) NOT DETECTED NOT DETECTED Final   Streptococcus species NOT DETECTED NOT DETECTED Final   Streptococcus agalactiae NOT DETECTED NOT DETECTED Final   Streptococcus pneumoniae NOT DETECTED NOT DETECTED Final   Streptococcus pyogenes NOT DETECTED NOT DETECTED Final   Acinetobacter baumannii NOT DETECTED NOT DETECTED Final   Enterobacteriaceae species NOT DETECTED NOT DETECTED Final   Enterobacter cloacae complex NOT DETECTED NOT DETECTED Final   Escherichia coli NOT DETECTED NOT DETECTED Final   Klebsiella oxytoca NOT DETECTED NOT DETECTED Final   Klebsiella pneumoniae NOT DETECTED NOT DETECTED Final   Proteus species NOT DETECTED NOT DETECTED Final   Serratia marcescens NOT DETECTED NOT DETECTED Final   Haemophilus influenzae NOT DETECTED NOT DETECTED Final   Neisseria meningitidis NOT DETECTED NOT DETECTED Final   Pseudomonas aeruginosa NOT DETECTED NOT DETECTED Final   Candida albicans NOT DETECTED NOT DETECTED Final   Candida glabrata NOT DETECTED NOT DETECTED Final   Candida krusei NOT DETECTED NOT DETECTED Final   Candida parapsilosis NOT DETECTED NOT DETECTED Final   Candida tropicalis NOT DETECTED NOT DETECTED Final    Comment: Performed at [facility], [place] [address], [place], [place] [address]  Blood Culture (routine x 2)     Status: Abnormal   Collection Time: 01/17/18  2:59 AM  Result Value Ref Range Status   Specimen Description   Final    BLOOD LEFT  ANTECUBITAL Performed at [facility], [address] [place], [place] [address]    Special Requests   Final    BOTTLES DRAWN AEROBIC AND ANAEROBIC Blood Culture adequate volume Performed at [facility], [address], [place], [place] [address]    Culture  Setup Time   Final    GRAM POSITIVE COCCI Gram Stain Report Called to,Read Back By and Verified With: MOTLEY @ [id number] ON [id number] BY HENDERSON L. Performed at [facility], [address], [place], [place] [address]    Culture (A)  Final    PEPTOSTREPTOCOCCUS ASACCHAROLYTICUS ANAEROBIC GRAM NEGATIVE ROD BETA LACTAMASE POSITIVE Performed at [facility], [place] [address] [place], [place] [address]    Report Status 01/22/2018 FINAL  Final  MRSA PCR Screening     Status: Abnormal   Collection Time: 01/17/18  5:56 AM  Result Value Ref Range Status   MRSA by PCR POSITIVE (A) NEGATIVE Final    Comment:        The GeneXpert MRSA Assay (FDA approved for NASAL specimens only), is one component of a comprehensive MRSA colonization surveillance program. It is not intended to diagnose MRSA infection nor to guide or monitor treatment for MRSA infections. RESULT CALLED TO, READ BACK BY AND VERIFIED WITH: EVANS,H @ 4536 ON 01/17/18 BY JUW Performed at [facility], [address], [place], [place] [address]   Culture, Urine     Status: None   Collection Time: 01/19/18 11:04 AM  Result Value Ref Range Status   Specimen Description   Final    URINE, CLEAN CATCH Performed at [facility], [address] [place], [place] [address]    Special Requests   Final    NONE Performed at [facility], [address], [place], [place] [address]    Culture   Final    Multiple bacterial morphotypes present, none predominant. Suggest appropriate recollection if clinically indicated.   Report Status 01/20/2018 FINAL  Final  Culture, blood (Routine X 2) w Reflex to ID Panel  Status: None   Collection Time: 01/19/18 11:26 AM  Result Value  Ref Range Status   Specimen Description RIGHT ANTECUBITAL  Final   Special Requests   Final    BOTTLES DRAWN AEROBIC AND ANAEROBIC Blood Culture adequate volume   Culture   Final    NO GROWTH 5 DAYS Performed at [facility], [address], [place], [place] [address]    Report Status 01/24/2018 FINAL  Final  Culture, blood (Routine X 2) w Reflex to ID Panel     Status: None   Collection Time: 01/19/18 11:37 AM  Result Value Ref Range Status   Specimen Description BLOOD LEFT HAND  Final   Special Requests   Final    BOTTLES DRAWN AEROBIC AND ANAEROBIC Blood Culture adequate volume   Culture   Final    NO GROWTH 5 DAYS Performed at [facility], [address], [place], [place] [address]    Report Status 01/24/2018 FINAL  Final  Aerobic/Anaerobic Culture (surgical/deep wound)     Status: None   Collection Time: 01/25/18 11:19 AM  Result Value Ref Range Status   Specimen Description ABSCESS  Final   Special Requests Normal  Final   Gram Stain   Final    ABUNDANT WBC PRESENT, PREDOMINANTLY PMN MODERATE GRAM POSITIVE RODS MODERATE GRAM POSITIVE COCCI FEW GRAM NEGATIVE RODS    Culture   Final    MODERATE ENTEROCOCCUS FAECALIS MODERATE BACTEROIDES THETAIOTAOMICRON BETA LACTAMASE NEGATIVE Performed at [facility], [place] [address], [place], [place] [address]    Report Status 01/31/2018 FINAL  Final   Organism ID, Bacteria ENTEROCOCCUS FAECALIS  Final      Susceptibility   Enterococcus faecalis - MIC*    AMPICILLIN <=2 SENSITIVE Sensitive     VANCOMYCIN 1 SENSITIVE Sensitive     GENTAMICIN SYNERGY RESISTANT Resistant     * MODERATE ENTEROCOCCUS FAECALIS  Surgical pcr screen     Status: Abnormal   Collection Time: 01/28/18  1:15 AM  Result Value Ref Range Status   MRSA, PCR POSITIVE (A) NEGATIVE Final    Comment: RESULT CALLED TO, READ BACK BY AND VERIFIED WITH: HARGROVE,C RN 0308 01/28/18 MITCHELL,L    Staphylococcus aureus POSITIVE (A) NEGATIVE Final    Comment:  (NOTE) The Xpert SA Assay (FDA approved for NASAL specimens in patients 53 years of age and older), is one component of a comprehensive surveillance program. It is not intended to diagnose infection nor to guide or monitor treatment.   Aerobic/Anaerobic Culture (surgical/deep wound)     Status: None   Collection Time: 01/29/18  3:27 PM  Result Value Ref Range Status   Specimen Description ABSCESS LEFT THIGH  Final   Special Requests NONE  Final   Gram Stain   Final    ABUNDANT WBC PRESENT, PREDOMINANTLY PMN FEW GRAM POSITIVE COCCI RARE GRAM POSITIVE RODS    Culture   Final    FEW ENTEROCOCCUS FAECALIS RARE KLEBSIELLA PNEUMONIAE NO ANAEROBES ISOLATED Performed at [facility], [address] [place], [place] [address]    Report Status 02/03/2018 FINAL  Final   Organism ID, Bacteria ENTEROCOCCUS FAECALIS  Final   Organism ID, Bacteria KLEBSIELLA PNEUMONIAE  Final      Susceptibility   Enterococcus faecalis - MIC*    AMPICILLIN <=2 SENSITIVE Sensitive     VANCOMYCIN 1 SENSITIVE Sensitive     GENTAMICIN SYNERGY RESISTANT Resistant     * FEW ENTEROCOCCUS FAECALIS   Klebsiella pneumoniae -  MIC*    AMPICILLIN >=32 RESISTANT Resistant     CEFAZOLIN <=4 SENSITIVE Sensitive     CEFEPIME <=1 SENSITIVE Sensitive     CEFTAZIDIME <=1 SENSITIVE Sensitive     CEFTRIAXONE <=1 SENSITIVE Sensitive     CIPROFLOXACIN <=0.25 SENSITIVE Sensitive     GENTAMICIN <=1 SENSITIVE Sensitive     IMIPENEM <=0.25 SENSITIVE Sensitive     TRIMETH/SULFA <=20 SENSITIVE Sensitive     AMPICILLIN/SULBACTAM 8 SENSITIVE Sensitive     PIP/TAZO <=4 SENSITIVE Sensitive     Extended ESBL NEGATIVE Sensitive     * RARE KLEBSIELLA PNEUMONIAE  Culture, blood (routine x 2)     Status: None (Preliminary result)   Collection Time: 02/01/18 12:50 PM  Result Value Ref Range Status   Specimen Description BLOOD RIGHT HAND  Final   Special Requests   Final    BOTTLES DRAWN AEROBIC ONLY Blood Culture adequate volume    Culture   Final    NO GROWTH 2 DAYS Performed at [facility], [place] [address], [place], [place] [address]    Report Status PENDING  Incomplete  Culture, blood (routine x 2)     Status: None (Preliminary result)   Collection Time: 02/01/18 12:52 PM  Result Value Ref Range Status   Specimen Description BLOOD RIGHT WRIST  Final   Special Requests   Final    BOTTLES DRAWN AEROBIC ONLY Blood Culture adequate volume   Culture   Final    NO GROWTH 2 DAYS Performed at [facility], [address] [place], [place] [address]    Report Status PENDING  Incomplete    Studies/Results: No results found.    Assessment/Plan:  INTERVAL HISTORY: He is yet again growing ampicillin sensitive enterococcus and a fairly sensitive Klebsiella from repeat drainage of his abscesses   Principal Problem:   Enterococcus faecalis infection Active Problems:   Sepsis secondary to UTI ([place])   Hypothyroidism   DKA (diabetic ketoacidoses) ([name])   Normocytic anemia   Sepsis due to Bacteroides species ([name])   Sepsis without acute organ dysfunction ([name])   Psoas abscess ([name])   Abscess    ECTOR LAUREL is a 84 y.o. 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  I have some concern with having 2 drains and also given his age but would defer to the primary team regards to placement.  I will arrange for hospital follow-up with [place] in the next 6 weeks in our clinic   LOS: 17 days   Alcide Evener 02/03/2018, 12:22 PM

## 2018-02-03 NOTE — Progress Notes (Signed)
Diagnosis: Abscesses and pelvic osteomyelitis  Culture Result: Ampicillin sensitive enterococcus, Klebiella pneumonia, PEPTOSTREPTOCOCCUS ASACCHAROLYTICUS  BACTEROIDES THETAIOTAOMICRON  BETA LACTAMASE POSITIVE   No Known Allergies  OPAT Orders Discharge antibiotics: Unasyn Per pharmacy protocol    Duration: 8 weeks End Date:  March 25, 2018   [facility] Care Per Protocol:  Labs weekly while on IV antibiotics: x__ CBC with differential _x_ BMP  _x_ CRP _x_ ESR   _x_ Please pull PIC at completion of IV antibiotics __ Please leave PIC in place until doctor has seen patient or been notified  Fax weekly labs to [phone]  Clinic Follow Up Appt:  Next 6 weeks.

## 2018-02-03 NOTE — Progress Notes (Signed)
PHARMACY CONSULT NOTE FOR:  OUTPATIENT  PARENTERAL ANTIBIOTIC THERAPY (OPAT)  Indication: abscesses and pelvic osteomyelitis Regimen: Unasyn 3gm IV Q6H End date: 03/25/18  IV antibiotic discharge orders are pended. To discharging provider:  please sign these orders via discharge navigator,  Select New Orders & click on the button choice - Manage This Unsigned Work.     Thank you for allowing pharmacy to be a part of this patient's care.  Cristen Bredeson D. Mina Marble, PharmD, BCPS, [place] 02/03/2018, 12:50 PM

## 2018-02-03 NOTE — Progress Notes (Signed)
Physical Therapy Treatment Patient Details Name: Anthony Dunlap MRN: [id number] DOB: 05-17-1933 Today's Date: 02/03/2018    History of Present Illness 84 y.o. male with medical history significant for hypothyroidism, insulin-dependent diabetes mellitus, hyperlipidemia, and history of prostate cancer with chronic Foley catheter, now presenting to the emergency department for evaluation of rigors.  Patient reports that he was in his usual state of health until overnight when he began sweating and developed shaking chills.  In the ED, pt was febrile with HR in 140s.  He also had serum glucose of 440 with anion gap of 12.  Pt admitted with sepsis and CT revealed muliple intramuscular abscesses of the abdomen and pelvis.  Pt is s/p drainage placement on 12/7 and drainage placement on 12/11.     PT Comments    Pt up in recliner on entry ready to get back to bed but willing to try ambulating with RW. Pt requires total A to try to stand at [facility] with maximal verbal and tactile cues for fully upright. Pt sat back down and was able to stand in [place] for transfer back to bed with modAx2    Follow Up Recommendations  SNF;Supervision/Assistance - 24 hour     Equipment Recommendations       Recommendations for Other Services OT consult     Precautions / Restrictions Precautions Precautions: Fall Precaution Comments: 2 jackson prat drains in place  Restrictions Weight Bearing Restrictions: No    Mobility  Bed Mobility               General bed mobility comments: received up in chair. STEDY with Nursing earlier.   Transfers Overall transfer level: Needs assistance Equipment used: Rolling walker (2 wheeled)(Stedy) Transfers: Sit to/from Stand Sit to Stand: Total assist;+2 physical assistance         General transfer comment: pt had sat up in chair since the morning but was willing to try to get up the RW, requiring vc for feet and hand placment for powerup, pt total A for sit>stand with  RW and not able to come to fully upright, sat back down and was able to sit>stand with Stedy with modAx2 and verbal and tactile cuing for standing all the way up   Ambulation/Gait Ambulation/Gait assistance: (no appripriate at this time, pt VERY weak)                   Balance           Standing balance support: During functional activity;Bilateral upper extremity supported Standing balance-Leahy Scale: Zero                              Cognition Arousal/Alertness: Awake/alert Behavior During Therapy: WFL for tasks assessed/performed Overall Cognitive Status: Within Functional Limits for tasks assessed                                           General Comments General comments (skin integrity, edema, etc.): pt continues to ooze clear fluid from rectum       Pertinent Vitals/Pain Pain Assessment: Faces Faces Pain Scale: Hurts even more Pain Location: bilateral LE with standing Pain Descriptors / Indicators: Grimacing Pain Intervention(s): Limited activity within patient's tolerance;Monitored during session;Repositioned           PT Goals (current goals can now be found  in the care plan section) Acute Rehab PT Goals Patient Stated Goal: return home with family to assist PT Goal Formulation: With patient Time For Goal Achievement: 02/11/18 Potential to Achieve Goals: Poor Progress towards PT goals: Not progressing toward goals - comment    Frequency    Min 3X/week      PT Plan Current plan remains appropriate       AM-PAC PT "6 Clicks" Mobility   Outcome Measure  Help needed turning from your back to your side while in a flat bed without using bedrails?: Total Help needed moving from lying on your back to sitting on the side of a flat bed without using bedrails?: Total Help needed moving to and from a bed to a chair (including a wheelchair)?: Total Help needed standing up from a chair using your arms (e.g., wheelchair or  bedside chair)?: Total Help needed to walk in hospital room?: Total Help needed climbing 3-5 steps with a railing? : Total 6 Click Score: 6    End of Session Equipment Utilized During Treatment: Gait belt Activity Tolerance: Patient limited by fatigue;Patient tolerated treatment well Patient left: in chair;with call bell/phone within reach;with family/visitor present Nurse Communication: Mobility status PT Visit Diagnosis: Unsteadiness on feet (R26.81);Other abnormalities of gait and mobility (R26.89);Muscle weakness (generalized) (M62.81)     Time: 5183-4373 PT Time Calculation (min) (ACUTE ONLY): 22 min  Charges:  $Therapeutic Activity: 8-22 mins                     Yared Susan B. Migdalia Dk PT, DPT Acute Rehabilitation Services Pager [phone] Office [phone]    [place] 02/03/2018, 6:13 PM

## 2018-02-03 NOTE — Progress Notes (Signed)
Referring Physician(s): Dr Kathreen Cosier  Supervising Physician: Marybelle Killings  Patient Status:  [facility] - In-pt  Chief Complaint:  Abdominal/left thigh abscesses  Subjective:  Right rectus sheath abscess drain placed 12/7 Left thigh abscess drain placed 12/11  Pt is better Up in chair Eating some OP has slowed in Rt abscess; Left abscess OP is great   Allergies: Patient has no known allergies.  Medications: Prior to Admission medications   Medication Sig Start Date End Date Taking? Authorizing Provider  aspirin EC 81 MG tablet Take 81 mg by mouth daily.   Yes [provider]  LEVEMIR FLEXTOUCH 100 UNIT/ML Pen Inject 30 Units into the skin daily. 01/07/18  Yes Kathie Dike, MD  levothyroxine (SYNTHROID, LEVOTHROID) 25 MCG tablet Take 25 mcg by mouth daily before breakfast.   Yes [provider]  pravastatin (PRAVACHOL) 10 MG tablet Take 10 mg by mouth daily.   Yes [provider]     Vital Signs: BP (!) 144/98 (BP Location: Right Arm)   Pulse (!) 106   Temp 99.6 F (37.6 C) (Oral)   Resp 20   Ht 5\' 8"  (1.727 m)   Wt 160 lb 11.5 oz (72.9 kg)   SpO2 97%   BMI 24.44 kg/m   Physical Exam Vitals signs reviewed.  Abdominal:     General: Bowel sounds are normal.     Palpations: Abdomen is soft.  Musculoskeletal: Normal range of motion.     Comments:     Skin:    General: Skin is warm and dry.     Comments: Right rectus sheath abscess is clean and dry NT no bleeding OP cloudy yellow 01/31/2018 FINAL   Organism ID, Bacteria ENTEROCOCCUS FAECALIS    Left thigh abscess drain darker in color OP much more ENTEROCOCCUS FAECALIS   Organism ID, Bacteria KLEBSIELLA PNEUMONIAE    Neurological:     Mental Status: He is alert.     Imaging: US Abdomen Limited Ruq  Result Date: 02/01/2018 CLINICAL DATA:  Abnormal liver function tests. EXAM: ULTRASOUND ABDOMEN LIMITED RIGHT UPPER QUADRANT COMPARISON:  CT scan of January 26, 2018.  FINDINGS: Gallbladder: No gallstones or wall thickening visualized. No sonographic Murphy sign noted by sonographer. Common bile duct: Diameter: 3 mm which is within normal limits. Liver: No focal lesion identified. Within normal limits in parenchymal echogenicity. Portal vein is patent on color Doppler imaging with normal direction of blood flow towards the liver. IMPRESSION: No definite abnormality seen in the right upper quadrant of the abdomen. Electronically Signed   By: Marijo Conception, M.D.   On: 02/01/2018 10:50    Labs:  CBC: Recent Labs    01/31/18 1041 02/01/18 0454 02/02/18 0232 02/03/18 0610  WBC 27.9* 17.6* 16.9* 17.9*  HGB 8.4* 7.3* 7.2* 7.7*  HCT 27.6* 24.7* 24.4* 26.2*  PLT 1,259* 946* 879* 940*    COAGS: Recent Labs    01/03/18 1929 01/17/18 0443 01/25/18 0348  INR 1.20 1.31 1.15  APTT  --  37*  --     BMP: Recent Labs    02/01/18 0454 02/02/18 0232 02/02/18 1816 02/03/18 0610  NA 134* 132* 132* 133*  K 2.8* 2.5* 3.0* 3.4*  CL 98 94* 95* 94*  CO2 24 26 23 26   GLUCOSE 168* 145* 182* 181*  BUN 7* 7* 7* 8  CALCIUM 7.3* 7.2* 7.2* 7.5*  CREATININE 0.75 0.71 0.65 0.78  GFRNONAA >60 >60 >60 >60  GFRAA >60 >60 >60 >60  LIVER FUNCTION TESTS: Recent Labs    01/05/18 0551 01/17/18 0237 01/30/18 0225 01/31/18 1041  BILITOT 0.3 0.7 0.3 0.8  AST 21 19 38 56*  ALT 19 15 31 31   ALKPHOS 141* 152* 249* 330*  PROT 7.1 7.2 5.7* 6.4*  ALBUMIN 2.6* 2.5* 1.4* 1.6*    Assessment and Plan:  Right rectus sheath abscess drain is slowing nicely Left thigh abscess draining well Will need CT when OPs are less than 10-15 cc daily will follow   Electronically Signed: Lavonia Drafts, PA-C 02/03/2018, 10:21 AM   I spent a total of 15 Minutes at the the patient's bedside AND on the patient's hospital floor or unit, greater than 50% of which was counseling/coordinating care for abscess drains

## 2018-02-03 NOTE — Social Work (Signed)
CSW continuing to follow for support with disposition when medically appropriate.  Aida Lemaire, MSW, LCSWA [facility] Work [phone]   

## 2018-02-03 NOTE — Progress Notes (Signed)
PROGRESS NOTE  Anthony Dunlap YHC:[id number] DOB: 1933/03/13 DOA: 01/17/2018 PCP: [name], MD  HPI/Recap of past 24 hours: 84 y.o.malewith medical history significant forhypothyroidism, insulin-dependent diabetes mellitus, hyperlipidemia, and history of prostate cancer with chronic Foley catheter, presented to the emergency room on 11/29 with signs of sepsis and DKA.  Has chronic Foley catheter, but urine culture unrevealing.  Blood cultures grew out peptostreptococcus and Bacteroides thetaiodomicron. 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 He was seen by IR who placed drains. Patient seen by ID who are recommending 6-8 weeks of IV antibiotics.  DKA since treated and CBGs have improved.  Patient seen today, doing okay.  Only complaint is of light jellylike stools on a daily basis (he normally struggles with constipation).  White blood cell count slightly increased today, no fever.  CBGs trending upward.  Assessment/Plan: Principal Problem:   Enterococcus faecalis infection Active Problems:   Sepsis secondary to UTI ([name])   Hypothyroidism   DKA (diabetic ketoacidoses) ([name])   Normocytic anemia   Sepsis due to Bacteroides species ([name])   Sepsis without acute organ dysfunction ([name])   Psoas abscess ([name])   Abscess 1. 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 Appreciate ID evaluation. Continue IV Unasyn for 6 weeks.  Leukocytosis initially trended downwards, but has since leveled off and slight increase today.  Still with purulent drainage from drains.  Echocardiogram unrevealing.  If white blood cell count with no improvement tomorrow, will recheck CT.    2. DKA: POA. Now resolved and off Insulin drip. Transitioned to Levemir/ SSI.  Noted CBGs have started to trend back upward.  Will increase  Lantus, but this may be a sign of worsening infection.  3. Hypothyroidism: Continue Synthroid  4. Prostate cancer -has chronic foley  5. Reactive Thrombocytosis-on aspirin to 325 mg.  6. Hyperlipidemia: Continue statin  7. Leg edema: d/c IV fluids as eating and BP stable. Lasix x 1 given for lower extremity edema.  Hypokalemia -Potassium today was low.  Replacement ordered. -Continue to monitor electrolytes and replace as needed.  Code Status: Full code  Family Communication: Daughter at the bedside  Disposition Plan: Discharge home in the next few days on IV antibiotics once white blood cell count normalizes   Consultants:  Infectious disease  Interventional radiology  Procedures:  Placement of right rectus sheath drain 12/7  Placement of left thigh abscess drain 12/11  Antimicrobials:  IV Unasyn 12/7-present  IV vancomycin 12/5-12/6  IV Flagyl 12/4-12/5  IV penicillin 12/3-12/4  IV Zosyn 12/5-12/7  IV Merrem 12/4 only  IV Ancef 12/1-12/3  IV cefepime 11/29-12/1  DVT prophylaxis: Lovenox   Objective: Vitals:   02/03/18 0340 02/03/18 1637  BP: (!) 144/98 129/70  Pulse: (!) 106 (!) 106  Resp: 20 18  Temp: 99.6 F (37.6 C) 97.9 F (36.6 C)  SpO2: 97% 99%    Intake/Output Summary (Last 24 hours) at 02/03/2018 2028 Last data filed at 02/03/2018 1803 Gross per 24 hour  Intake 1090 ml  Output 1578 ml  Net -488 ml   Filed Weights   01/17/18 0209 01/17/18 0600 01/23/18 2337  Weight: 70.3 kg 67.1 kg 72.9 kg   Body mass index is 24.44 kg/m.  Exam:   General: Alert and oriented x2, no acute distress  HEENT: Normocephalic atraumatic, mucous membranes slightly dry  Neck: Supple, no JVD  Cardiovascular: Regular rate and rhythm, S1-S2  Respiratory: Clear to auscultation bilaterally  Abdomen: Soft, nontender, nondistended, positive bowel sounds  Musculoskeletal: Noted bilateral drains in place  Skin: Sheaths noted.  Otherwise no  skin breaks, tears or lesions  Psychiatry: Appropriate, no evidence of psychoses  Neuro: No focal deficits   Data Reviewed: CBC: Recent Labs  Lab 01/30/18 0225 01/31/18 1041 02/01/18 0454 02/02/18 0232 02/03/18 0610  WBC 19.3* 27.9* 17.6* 16.9* 17.9*  NEUTROABS 16.0*  --   --   --   --   HGB 7.5* 8.4* 7.3* 7.2* 7.7*  HCT 26.0* 27.6* 24.7* 24.4* 26.2*  MCV 80.0 80.0 79.4* 79.2* 79.6*  PLT 883* 1,259* 946* 879* 093*   Basic Metabolic Panel: Recent Labs  Lab 01/31/18 1041 02/01/18 0454 02/02/18 0232 02/02/18 1816 02/03/18 0610  NA 132* 134* 132* 132* 133*  K 3.1* 2.8* 2.5* 3.0* 3.4*  CL 96* 98 94* 95* 94*  CO2 21* 24 26 23 26   GLUCOSE 205* 168* 145* 182* 181*  BUN 8 7* 7* 7* 8  CREATININE 0.88 0.75 0.71 0.65 0.78  CALCIUM 7.4* 7.3* 7.2* 7.2* 7.5*   GFR: Estimated Creatinine Clearance: 68.9 mL/min (by C-G formula based on SCr of 0.78 mg/dL). Liver Function Tests: Recent Labs  Lab 01/30/18 0225 01/31/18 1041  AST 38 56*  ALT 31 31  ALKPHOS 249* 330*  BILITOT 0.3 0.8  PROT 5.7* 6.4*  ALBUMIN 1.4* 1.6*   No results for input(s): LIPASE, AMYLASE in the last 168 hours. No results for input(s): AMMONIA in the last 168 hours. Coagulation Profile: No results for input(s): INR, PROTIME in the last 168 hours. Cardiac Enzymes: No results for input(s): CKTOTAL, CKMB, CKMBINDEX, TROPONINI in the last 168 hours. BNP (last 3 results) No results for input(s): PROBNP in the last 8760 hours. HbA1C: No results for input(s): HGBA1C in the last 72 hours. CBG: Recent Labs  Lab 02/02/18 1751 02/02/18 2126 02/03/18 0834 02/03/18 1307 02/03/18 1739  GLUCAP 174* 339* 192* 381* 304*   Lipid Profile: No results for input(s): CHOL, HDL, LDLCALC, TRIG, CHOLHDL, LDLDIRECT in the last 72 hours. Thyroid Function Tests: No results for input(s): TSH, T4TOTAL, FREET4, T3FREE, THYROIDAB in the last 72 hours. Anemia Panel: No results for input(s): VITAMINB12, FOLATE, FERRITIN,  TIBC, IRON, RETICCTPCT in the last 72 hours. Urine analysis:    Component Value Date/Time   COLORURINE YELLOW 01/17/2018 0237   APPEARANCEUR CLOUDY (A) 01/17/2018 0237   LABSPEC 1.022 01/17/2018 0237   PHURINE 5.0 01/17/2018 0237   GLUCOSEU >=500 (A) 01/17/2018 0237   HGBUR SMALL (A) 01/17/2018 0237   BILIRUBINUR NEGATIVE 01/17/2018 0237   KETONESUR 5 (A) 01/17/2018 0237   PROTEINUR 30 (A) 01/17/2018 0237   UROBILINOGEN 0.2 09/03/2013 0132   NITRITE POSITIVE (A) 01/17/2018 0237   LEUKOCYTESUR LARGE (A) 01/17/2018 0237   Sepsis Labs: @LABRCNTIP (procalcitonin:4,lacticidven:4)  ) Recent Results (from the past 240 hour(s))  Aerobic/Anaerobic Culture (surgical/deep wound)     Status: None   Collection Time: 01/25/18 11:19 AM  Result Value Ref Range Status   Specimen Description ABSCESS  Final   Special Requests Normal  Final   Gram Stain   Final    ABUNDANT WBC PRESENT, PREDOMINANTLY PMN MODERATE GRAM POSITIVE RODS MODERATE GRAM POSITIVE COCCI FEW GRAM NEGATIVE RODS    Culture   Final    MODERATE ENTEROCOCCUS FAECALIS MODERATE BACTEROIDES THETAIOTAOMICRON BETA LACTAMASE NEGATIVE Performed at [facility], [place] [address] [place], [place] [address]  Report Status 01/31/2018 FINAL  Final   Organism ID, Bacteria ENTEROCOCCUS FAECALIS  Final      Susceptibility   Enterococcus faecalis - MIC*    AMPICILLIN <=2 SENSITIVE Sensitive     VANCOMYCIN 1 SENSITIVE Sensitive     GENTAMICIN SYNERGY RESISTANT Resistant     * MODERATE ENTEROCOCCUS FAECALIS  Surgical pcr screen     Status: Abnormal   Collection Time: 01/28/18  1:15 AM  Result Value Ref Range Status   MRSA, PCR POSITIVE (A) NEGATIVE Final    Comment: RESULT CALLED TO, READ BACK BY AND VERIFIED WITH: HARGROVE,C RN 0308 01/28/18 MITCHELL,L    Staphylococcus aureus POSITIVE (A) NEGATIVE Final    Comment: (NOTE) The Xpert SA Assay (FDA approved for NASAL specimens in patients 61 years of age and older), is one  component of a comprehensive surveillance program. It is not intended to diagnose infection nor to guide or monitor treatment.   Aerobic/Anaerobic Culture (surgical/deep wound)     Status: None   Collection Time: 01/29/18  3:27 PM  Result Value Ref Range Status   Specimen Description ABSCESS LEFT THIGH  Final   Special Requests NONE  Final   Gram Stain   Final    ABUNDANT WBC PRESENT, PREDOMINANTLY PMN FEW GRAM POSITIVE COCCI RARE GRAM POSITIVE RODS    Culture   Final    FEW ENTEROCOCCUS FAECALIS RARE KLEBSIELLA PNEUMONIAE NO ANAEROBES ISOLATED Performed at [facility], [address] [place], [place] [address]    Report Status 02/03/2018 FINAL  Final   Organism ID, Bacteria ENTEROCOCCUS FAECALIS  Final   Organism ID, Bacteria KLEBSIELLA PNEUMONIAE  Final      Susceptibility   Enterococcus faecalis - MIC*    AMPICILLIN <=2 SENSITIVE Sensitive     VANCOMYCIN 1 SENSITIVE Sensitive     GENTAMICIN SYNERGY RESISTANT Resistant     * FEW ENTEROCOCCUS FAECALIS   Klebsiella pneumoniae - MIC*    AMPICILLIN >=32 RESISTANT Resistant     CEFAZOLIN <=4 SENSITIVE Sensitive     CEFEPIME <=1 SENSITIVE Sensitive     CEFTAZIDIME <=1 SENSITIVE Sensitive     CEFTRIAXONE <=1 SENSITIVE Sensitive     CIPROFLOXACIN <=0.25 SENSITIVE Sensitive     GENTAMICIN <=1 SENSITIVE Sensitive     IMIPENEM <=0.25 SENSITIVE Sensitive     TRIMETH/SULFA <=20 SENSITIVE Sensitive     AMPICILLIN/SULBACTAM 8 SENSITIVE Sensitive     PIP/TAZO <=4 SENSITIVE Sensitive     Extended ESBL NEGATIVE Sensitive     * RARE KLEBSIELLA PNEUMONIAE  Culture, blood (routine x 2)     Status: None (Preliminary result)   Collection Time: 02/01/18 12:50 PM  Result Value Ref Range Status   Specimen Description BLOOD RIGHT HAND  Final   Special Requests   Final    BOTTLES DRAWN AEROBIC ONLY Blood Culture adequate volume   Culture   Final    NO GROWTH 2 DAYS Performed at [facility], [place] [address], [place], [place]  [address]    Report Status PENDING  Incomplete  Culture, blood (routine x 2)     Status: None (Preliminary result)   Collection Time: 02/01/18 12:52 PM  Result Value Ref Range Status   Specimen Description BLOOD RIGHT WRIST  Final   Special Requests   Final    BOTTLES DRAWN AEROBIC ONLY Blood Culture adequate volume   Culture   Final    NO GROWTH 2 DAYS Performed at [facility], [address] [place], [place] [address]    Report Status PENDING  Incomplete      Studies: No results found.  Scheduled Meds: . sodium chloride   Intravenous Once  . aspirin  325 mg Oral Daily  . enoxaparin (LOVENOX) injection  40 mg Subcutaneous Q24H  . feeding supplement (GLUCERNA SHAKE)  237 mL Oral BID BM  . insulin aspart  0-9 Units Subcutaneous TID WC  . insulin detemir  15 Units Subcutaneous QHS  . levothyroxine  25 mcg Oral Q0600  . povidone-iodine  2 application Topical Once  . pravastatin  10 mg Oral q1800  . sodium chloride flush  5 mL Intracatheter Q8H  . sodium chloride flush  5 mL Intracatheter Q8H    Continuous Infusions: . sodium chloride Stopped (01/31/18 1800)  . ampicillin-sulbactam (UNASYN) IV 3 g (02/03/18 1803)     LOS: 17 days     Annita Brod, MD [facility]  To reach me or the doctor on call, go to: [email or web address] Password [facility]  02/03/2018, 8:28 PM

## 2018-02-04 LAB — TYPE AND SCREEN
ABO/RH(D): O POS
Antibody Screen: NEGATIVE
Unit division: 0

## 2018-02-04 LAB — BASIC METABOLIC PANEL
Anion gap: 12 (ref 5–15)
BUN: 9 mg/dL (ref 8–23)
CO2: 24 mmol/L (ref 22–32)
Calcium: 7.4 mg/dL — ABNORMAL LOW (ref 8.9–10.3)
Chloride: 96 mmol/L — ABNORMAL LOW (ref 98–111)
Creatinine, Ser: 0.71 mg/dL (ref 0.61–1.24)
GFR calc Af Amer: >60 mL/min (ref >60)
GFR calc non Af Amer: >60 mL/min (ref >60)
Glucose, Bld: 220 mg/dL — ABNORMAL HIGH (ref 70–99)
Potassium: 2.8 mmol/L — ABNORMAL LOW (ref 3.5–5.1)
SODIUM: 132 mmol/L — AB (ref 135–145)

## 2018-02-04 LAB — GLUCOSE, CAPILLARY
GLUCOSE-CAPILLARY: 105 mg/dL — AB (ref 70–99)
Glucose-Capillary: 130 mg/dL — ABNORMAL HIGH (ref 70–99)
Glucose-Capillary: 149 mg/dL — ABNORMAL HIGH (ref 70–99)
Glucose-Capillary: 218 mg/dL — ABNORMAL HIGH (ref 70–99)

## 2018-02-04 LAB — BPAM RBC
Blood Product Expiration Date: 202001112359
Unit Type and Rh: 5100

## 2018-02-04 LAB — CBC
HCT: 22.9 % — ABNORMAL LOW (ref 39.0–52.0)
Hemoglobin: 6.8 g/dL — CL (ref 13.0–17.0)
MCH: 23.6 pg — AB (ref 26.0–34.0)
MCHC: 29.7 g/dL — ABNORMAL LOW (ref 30.0–36.0)
MCV: 79.5 fL — ABNORMAL LOW (ref 80.0–100.0)
Platelets: 777 10*3/uL — ABNORMAL HIGH (ref 150–400)
RBC: 2.88 MIL/uL — AB (ref 4.22–5.81)
RDW: 15 % (ref 11.5–15.5)
WBC: 14.6 10*3/uL — ABNORMAL HIGH (ref 4.0–10.5)
nRBC: 0 % (ref 0.0–0.2)

## 2018-02-04 LAB — PATHOLOGIST SMEAR REVIEW

## 2018-02-04 LAB — MAGNESIUM: Magnesium: 1.6 mg/dL — ABNORMAL LOW (ref 1.7–2.4)

## 2018-02-04 LAB — PREPARE RBC (CROSSMATCH)

## 2018-02-04 MED ORDER — POTASSIUM CHLORIDE CRYS ER 20 MEQ PO TBCR
40.0000 meq | EXTENDED_RELEASE_TABLET | Freq: Once | ORAL | Status: AC
  Administered 2018-02-04: 40 meq via ORAL
  Filled 2018-02-04: qty 2

## 2018-02-04 MED ORDER — SODIUM CHLORIDE 0.9% IV SOLUTION
Freq: Once | INTRAVENOUS | Status: AC
  Administered 2018-02-04: 08:00:00 via INTRAVENOUS

## 2018-02-04 MED ORDER — MAGNESIUM OXIDE 400 (241.3 MG) MG PO TABS
400.0000 mg | ORAL_TABLET | Freq: Every day | ORAL | Status: DC
  Administered 2018-02-05 – 2018-02-18 (×14): 400 mg via ORAL
  Filled 2018-02-04 (×14): qty 1

## 2018-02-04 MED ORDER — ASPIRIN 81 MG PO CHEW
81.0000 mg | CHEWABLE_TABLET | Freq: Every day | ORAL | Status: DC
  Administered 2018-02-05 – 2018-02-18 (×13): 81 mg via ORAL
  Filled 2018-02-04 (×12): qty 1

## 2018-02-04 MED ORDER — POTASSIUM CHLORIDE CRYS ER 20 MEQ PO TBCR
40.0000 meq | EXTENDED_RELEASE_TABLET | Freq: Four times a day (QID) | ORAL | Status: AC
  Administered 2018-02-04 (×2): 40 meq via ORAL
  Filled 2018-02-04 (×2): qty 2

## 2018-02-04 MED ORDER — MAGNESIUM SULFATE 2 GM/50ML IV SOLN
2.0000 g | Freq: Once | INTRAVENOUS | Status: AC
  Administered 2018-02-04: 2 g via INTRAVENOUS
  Filled 2018-02-04: qty 50

## 2018-02-04 NOTE — Progress Notes (Signed)
PROGRESS NOTE    Anthony Dunlap  UKG:[id number] DOB: [birth year] DOA: 01/17/2018 PCP: [name], MD   Brief Narrative: Patient is 84 year old male with past medical history of hypothyroidism, insulin-dependent diabetes, hyperlipidemia, prostate cancer with chronic Foley catheter who presented with sepsis and DKA.  Blood culture grew Peptostreptococcus and Bacteroides. Wound culture grew Enterococcus faecalis and Klebsiella pneumonia.  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  IR consulted and he  is status post drain placement.  Patient was also seen by infectious disease who recommended 8 weeks of IV antibiotics.  DKA has resolved. Hemoglobin dropped today to 6.8.  He was transfused with one unit of PRBC.  Assessment & Plan:   Principal Problem:   Enterococcus faecalis infection Active Problems:   Sepsis secondary to UTI ([place])   Hypothyroidism   DKA (diabetic ketoacidoses) ([name])   Normocytic anemia   Sepsis due to Bacteroides species ([name])   Sepsis without acute organ dysfunction ([name])   Psoas abscess ([name])   Abscess  Bacteremia/sepsis: Blood cultures grew bacteroids and Peptostreptococcus.  Wound cultures grew enterococcus and Klebsiella pneumonia.  CT abdomen/pelvis showed osteomyelitis of the pubic symphysis with abscess in the rectus sheath and left thigh.  Status post drains placement.  Rectus sheath abscess-S/P drain placement by Dr. Kathlene Cote 01/25/2018 Thigh abscess-S/P Aspiration and drain placement anterior thigh abscess 01/29/18 by Dr. Laurence Ferrari  IR following.  Plan is to continue IV Unasyn for total of 8 weeks.  Still has significant amount of drainage. Echocardiogram was unrevealing for endocarditis. Leukocytosis improved today. We will put a PICC line when he is closing the discharge date. IR planning to repeat CT when abdominal drain on the right side has about 20 mL or less of  output.  DKA: Resolved.  Insulin drip was stopped.  Transition to Levemir and sliding scale insulin.  Hypothyroidism: Continue Synthyroid  Prostate cancer: Has chronic Foley catheter placed  History of reactive thrombocytosis: On aspirin 81 mg daily.  Will change aspirin to 81 mg from 325 mg.  Hyperlipidemia: Continue statin  Lower extremity edema: Status post dose of Lasix.  IV fluids discontinued  Hypokalemia: Currently being supplemented.  Will check magnesium  Anemia: Hemoglobin dropped to 6.8 today.  Most likely associated with his chronic medical problems.  No hematochezia or melena.  Will check stool FOBT.  Transfused with 1 unit of PRBC today          DVT prophylaxis: SCD Code Status: Full Family Communication: None present at the bedside Disposition Plan: Skilled nursing facility after clearance from IR.   Consultants: IR, ID  Procedures: Placement of drains  Antimicrobials: Currently on Unasyn since 12/7  Subjective: Patient seen and examined the bedside this afternoon.  Remains hemodynamically stable.  Denies any specific complaints.  Feels better.  Thigh drain is still draining significant amount of fluid.  Objective: Vitals:   02/04/18 0758 02/04/18 0816 02/04/18 1056 02/04/18 1406  BP: (!) 141/73 129/86 116/66 136/72  Pulse: (!) 108 (!) 109 (!) 109 (!) 105  Resp: 18 18 16 18   Temp: 98.4 F (36.9 C) 98.4 F (36.9 C) 99 F (37.2 C) 99.1 F (37.3 C)  TempSrc: Oral Oral Oral Oral  SpO2: 98% 98% 98% 99%  Weight:      Height:        Intake/Output Summary (Last 24 hours) at 02/04/2018 1512 Last data filed at 02/04/2018 1406 Gross per 24 hour  Intake 1755  ml  Output 938 ml  Net 817 ml   Filed Weights   01/17/18 0209 01/17/18 0600 01/23/18 2337  Weight: 70.3 kg 67.1 kg 72.9 kg    Examination:  General exam: Appears calm and comfortable ,Not in distress,average built HEENT:PERRL,Oral mucosa moist, Ear/Nose normal on gross exam Respiratory  system: Bilateral equal air entry, normal vesicular breath sounds, no wheezes or crackles  Cardiovascular system: S1 & S2 heard, RRR. No JVD, murmurs, rubs, gallops or clicks. No pedal edema. Gastrointestinal system: Abdomen is nondistended, soft and nontender. No organomegaly or masses felt. Normal bowel sounds heard.Abdominal drain Central nervous system: Alert and oriented. No focal neurological deficits. Extremities: Mild lower extremity edema, no clubbing ,no cyanosis, distal peripheral pulses palpable.Drain on the left upper thigh Psychiatry: Judgement and insight appear normal. Mood & affect appropriate.     Data Reviewed: I have personally reviewed following labs and imaging studies  CBC: Recent Labs  Lab 01/30/18 0225 01/31/18 1041 02/01/18 0454 02/02/18 0232 02/03/18 0610 02/04/18 0231  WBC 19.3* 27.9* 17.6* 16.9* 17.9* 14.6*  NEUTROABS 16.0*  --   --   --   --   --   HGB 7.5* 8.4* 7.3* 7.2* 7.7* 6.8*  HCT 26.0* 27.6* 24.7* 24.4* 26.2* 22.9*  MCV 80.0 80.0 79.4* 79.2* 79.6* 79.5*  PLT 883* 1,259* 946* 879* 940* 149*   Basic Metabolic Panel: Recent Labs  Lab 02/01/18 0454 02/02/18 0232 02/02/18 1816 02/03/18 0610 02/04/18 0231  NA 134* 132* 132* 133* 132*  K 2.8* 2.5* 3.0* 3.4* 2.8*  CL 98 94* 95* 94* 96*  CO2 24 26 23 26 24   GLUCOSE 168* 145* 182* 181* 220*  BUN 7* 7* 7* 8 9  CREATININE 0.75 0.71 0.65 0.78 0.71  CALCIUM 7.3* 7.2* 7.2* 7.5* 7.4*   GFR: Estimated Creatinine Clearance: 68.9 mL/min (by C-G formula based on SCr of 0.71 mg/dL). Liver Function Tests: Recent Labs  Lab 01/30/18 0225 01/31/18 1041  AST 38 56*  ALT 31 31  ALKPHOS 249* 330*  BILITOT 0.3 0.8  PROT 5.7* 6.4*  ALBUMIN 1.4* 1.6*   No results for input(s): LIPASE, AMYLASE in the last 168 hours. No results for input(s): AMMONIA in the last 168 hours. Coagulation Profile: No results for input(s): INR, PROTIME in the last 168 hours. Cardiac Enzymes: No results for input(s):  CKTOTAL, CKMB, CKMBINDEX, TROPONINI in the last 168 hours. BNP (last 3 results) No results for input(s): PROBNP in the last 8760 hours. HbA1C: No results for input(s): HGBA1C in the last 72 hours. CBG: Recent Labs  Lab 02/03/18 1307 02/03/18 1739 02/03/18 2053 02/04/18 0803 02/04/18 1206  GLUCAP 381* 304* 197* 130* 149*   Lipid Profile: No results for input(s): CHOL, HDL, LDLCALC, TRIG, CHOLHDL, LDLDIRECT in the last 72 hours. Thyroid Function Tests: No results for input(s): TSH, T4TOTAL, FREET4, T3FREE, THYROIDAB in the last 72 hours. Anemia Panel: No results for input(s): VITAMINB12, FOLATE, FERRITIN, TIBC, IRON, RETICCTPCT in the last 72 hours. Sepsis Labs: No results for input(s): PROCALCITON, LATICACIDVEN in the last 168 hours.  Recent Results (from the past 240 hour(s))  Surgical pcr screen     Status: Abnormal   Collection Time: 01/28/18  1:15 AM  Result Value Ref Range Status   MRSA, PCR POSITIVE (A) NEGATIVE Final    Comment: RESULT CALLED TO, READ BACK BY AND VERIFIED WITH: HARGROVE,C RN 0308 01/28/18 MITCHELL,L    Staphylococcus aureus POSITIVE (A) NEGATIVE Final    Comment: (NOTE) The Xpert SA Assay (  FDA approved for NASAL specimens in patients 28 years of age and older), is one component of a comprehensive surveillance program. It is not intended to diagnose infection nor to guide or monitor treatment.   Aerobic/Anaerobic Culture (surgical/deep wound)     Status: None   Collection Time: 01/29/18  3:27 PM  Result Value Ref Range Status   Specimen Description ABSCESS LEFT THIGH  Final   Special Requests NONE  Final   Gram Stain   Final    ABUNDANT WBC PRESENT, PREDOMINANTLY PMN FEW GRAM POSITIVE COCCI RARE GRAM POSITIVE RODS    Culture   Final    FEW ENTEROCOCCUS FAECALIS RARE KLEBSIELLA PNEUMONIAE NO ANAEROBES ISOLATED Performed at [facility], [address], [place], [place] [address]    Report Status 02/03/2018 FINAL  Final   Organism ID,  Bacteria ENTEROCOCCUS FAECALIS  Final   Organism ID, Bacteria KLEBSIELLA PNEUMONIAE  Final      Susceptibility   Enterococcus faecalis - MIC*    AMPICILLIN <=2 SENSITIVE Sensitive     VANCOMYCIN 1 SENSITIVE Sensitive     GENTAMICIN SYNERGY RESISTANT Resistant     * FEW ENTEROCOCCUS FAECALIS   Klebsiella pneumoniae - MIC*    AMPICILLIN >=32 RESISTANT Resistant     CEFAZOLIN <=4 SENSITIVE Sensitive     CEFEPIME <=1 SENSITIVE Sensitive     CEFTAZIDIME <=1 SENSITIVE Sensitive     CEFTRIAXONE <=1 SENSITIVE Sensitive     CIPROFLOXACIN <=0.25 SENSITIVE Sensitive     GENTAMICIN <=1 SENSITIVE Sensitive     IMIPENEM <=0.25 SENSITIVE Sensitive     TRIMETH/SULFA <=20 SENSITIVE Sensitive     AMPICILLIN/SULBACTAM 8 SENSITIVE Sensitive     PIP/TAZO <=4 SENSITIVE Sensitive     Extended ESBL NEGATIVE Sensitive     * RARE KLEBSIELLA PNEUMONIAE  Culture, blood (routine x 2)     Status: None (Preliminary result)   Collection Time: 02/01/18 12:50 PM  Result Value Ref Range Status   Specimen Description BLOOD RIGHT HAND  Final   Special Requests   Final    BOTTLES DRAWN AEROBIC ONLY Blood Culture adequate volume   Culture   Final    NO GROWTH 3 DAYS Performed at [facility], [address] [place], [place] [address]    Report Status PENDING  Incomplete  Culture, blood (routine x 2)     Status: None (Preliminary result)   Collection Time: 02/01/18 12:52 PM  Result Value Ref Range Status   Specimen Description BLOOD RIGHT WRIST  Final   Special Requests   Final    BOTTLES DRAWN AEROBIC ONLY Blood Culture adequate volume   Culture   Final    NO GROWTH 3 DAYS Performed at [facility], [address] [place], [place] [address]    Report Status PENDING  Incomplete         Radiology Studies: No results found.      Scheduled Meds: . sodium chloride   Intravenous Once  . aspirin  325 mg Oral Daily  . enoxaparin (LOVENOX) injection  40 mg Subcutaneous Q24H  . feeding  supplement (GLUCERNA SHAKE)  237 mL Oral BID BM  . insulin aspart  0-9 Units Subcutaneous TID WC  . insulin detemir  20 Units Subcutaneous QHS  . levothyroxine  25 mcg Oral Q0600  . potassium chloride  40 mEq Oral Once  . povidone-iodine  2 application Topical Once  . pravastatin  10 mg Oral q1800  . sodium  chloride flush  5 mL Intracatheter Q8H  . sodium chloride flush  5 mL Intracatheter Q8H   Continuous Infusions: . sodium chloride Stopped (01/31/18 1800)  . ampicillin-sulbactam (UNASYN) IV 3 g (02/04/18 0956)     LOS: 18 days    Time spent: 35 mins.More than 50% of that time was spent in counseling and/or coordination of care.      Shelly Coss, MD [facility] Pager [phone]  If 7PM-7AM, please contact night-coverage [email or web address] Password [email or web address] 02/04/2018, 3:12 PM

## 2018-02-04 NOTE — Progress Notes (Signed)
Referring Physician(s): Dr Kathreen Cosier  Supervising Physician: Markus Daft  Patient Status:  [facility] - In-pt  Chief Complaint: Rectus sheath abscess S/P drain placement by Dr. Kathlene Cote 01/25/2018 Thigh abscess S/P Aspiration and drain placement anterior thigh abscess 01/29/18 by Dr. Laurence Ferrari  Subjective:  Doing OK. No complaints.  Allergies: Patient has no known allergies.  Medications: Prior to Admission medications   Medication Sig Start Date End Date Taking? Authorizing Provider  aspirin EC 81 MG tablet Take 81 mg by mouth daily.   Yes [provider]  LEVEMIR FLEXTOUCH 100 UNIT/ML Pen Inject 30 Units into the skin daily. 01/07/18  Yes Kathie Dike, MD  levothyroxine (SYNTHROID, LEVOTHROID) 25 MCG tablet Take 25 mcg by mouth daily before breakfast.   Yes [provider]  pravastatin (PRAVACHOL) 10 MG tablet Take 10 mg by mouth daily.   Yes [provider]     Vital Signs: BP 116/66   Pulse (!) 109   Temp 99 F (37.2 C) (Oral)   Resp 16   Ht 5\' 8"  (4.081 m)   Wt 72.9 kg   SpO2 98%   BMI 24.44 kg/m   Physical Exam Awake and alert NAD Abdominal/rectus drain in place on the right, nothing in gravity bag. ~60 mL output recorded. Left thigh still with copious purulent drainage. ~200 mL output recorded.  Imaging: US Abdomen Limited Ruq  Result Date: 02/01/2018 CLINICAL DATA:  Abnormal liver function tests. EXAM: ULTRASOUND ABDOMEN LIMITED RIGHT UPPER QUADRANT COMPARISON:  CT scan of January 26, 2018. FINDINGS: Gallbladder: No gallstones or wall thickening visualized. No sonographic Murphy sign noted by sonographer. Common bile duct: Diameter: 3 mm which is within normal limits. Liver: No focal lesion identified. Within normal limits in parenchymal echogenicity. Portal vein is patent on color Doppler imaging with normal direction of blood flow towards the liver. IMPRESSION: No definite abnormality seen in the right upper quadrant of the  abdomen. Electronically Signed   By: Marijo Conception, M.D.   On: 02/01/2018 10:50    Labs:  CBC: Recent Labs    02/01/18 0454 02/02/18 0232 02/03/18 0610 02/04/18 0231  WBC 17.6* 16.9* 17.9* 14.6*  HGB 7.3* 7.2* 7.7* 6.8*  HCT 24.7* 24.4* 26.2* 22.9*  PLT 946* 879* 940* 777*    COAGS: Recent Labs    01/03/18 1929 01/17/18 0443 01/25/18 0348  INR 1.20 1.31 1.15  APTT  --  37*  --     BMP: Recent Labs    02/02/18 0232 02/02/18 1816 02/03/18 0610 02/04/18 0231  NA 132* 132* 133* 132*  K 2.5* 3.0* 3.4* 2.8*  CL 94* 95* 94* 96*  CO2 26 23 26 24   GLUCOSE 145* 182* 181* 220*  BUN 7* 7* 8 9  CALCIUM 7.2* 7.2* 7.5* 7.4*  CREATININE 0.71 0.65 0.78 0.71  GFRNONAA >60 >60 >60 >60  GFRAA >60 >60 >60 >60    LIVER FUNCTION TESTS: Recent Labs    01/05/18 0551 01/17/18 0237 01/30/18 0225 01/31/18 1041  BILITOT 0.3 0.7 0.3 0.8  AST 21 19 38 56*  ALT 19 15 31 31   ALKPHOS 141* 152* 249* 330*  PROT 7.1 7.2 5.7* 6.4*  ALBUMIN 2.6* 2.5* 1.4* 1.6*    Assessment and Plan:  Rectus sheath abscess S/P drain placement by Dr. Kathlene Cote 01/25/2018 Still with ~60 mL output documented  Thigh abscess S/P Aspiration and drain placement anterior thigh abscess 01/29/18 by Dr. Laurence Ferrari ~200 mL output   Continue routine drain care.  Would repeat CT when abdominal drain on the right has about 20 mL or less output to evaluate for removal of that drain.  Thigh drain will likely remain longer than abdominal drain given copious daily output.  Electronically Signed: Murrell Redden, PA-C 02/04/2018, 11:36 AM    I spent a total of 15 Minutes at the the patient's bedside AND on the patient's hospital floor or unit, greater than 50% of which was counseling/coordinating care for f/u drains.

## 2018-02-04 NOTE — Progress Notes (Signed)
Inpatient Diabetes Program Recommendations  AACE/ADA: New Consensus Statement on Inpatient Glycemic Control (2015)  Target Ranges:  Prepandial:   less than 140 mg/dL      Peak postprandial:   less than 180 mg/dL (1-2 hours)      Critically ill patients:  140 - 180 mg/dL   Lab Results  Component Value Date   GLUCAP 130 (H) 02/04/2018   HGBA1C 9.6 (H) 01/04/2018    Review of Glycemic Control Results for Anthony Dunlap, Anthony Dunlap (MRN [id number]) as of 02/04/2018 09:06  Ref. Range 02/03/2018 08:34 02/03/2018 13:07 02/03/2018 17:39 02/03/2018 20:53 02/04/2018 08:03  Glucose-Capillary Latest Ref Range: 70 - 99 mg/dL 192 (H) 381 (H) 304 (H) 197 (H) 130 (H)   Inpatient Diabetes Program Recommendations:  Noted elevated postprandial CBGs.  Please consider ordering Novolog 3 units TID with meals for meal coverage if patient eats at least 50% of meals.  Thank you, Nani Gasser. Dustin Burrill, RN, MSN, CDE  Diabetes Coordinator Inpatient Glycemic Control Team Team Pager [phone] (8am-5pm) 02/04/2018 9:08 AM

## 2018-02-04 NOTE — Progress Notes (Signed)
CRITICAL VALUE ALERT  Critical Value: Hg 6.8  Date & Time Notied:  02/04/18  0405  Provider Notified: K. Shorr, NP  Orders Received/Actions taken: Yes

## 2018-02-04 NOTE — Social Work (Addendum)
CSW provided pt with CMS rating for [facility], pt daughter [name] contacted via telephone she is also aware of where that information is in the room. We discussed that pt daughter can go by [facility] and tour as well as talk to a liaison if they are interested. Pt daughter states she will look at information and try to visit, she states feeling overwhelmed by managing the care of pt and pt wife and driving to the hospital daily.  CSW continuing to follow for support with disposition when medically appropriate. When there is more clarity regarding discharge date will restart insurance authorization.  Westley Hummer, MSW, [place] Work [phone]

## 2018-02-05 LAB — BASIC METABOLIC PANEL
ANION GAP: 12 (ref 5–15)
BUN: 9 mg/dL (ref 8–23)
CO2: 26 mmol/L (ref 22–32)
Calcium: 7.7 mg/dL — ABNORMAL LOW (ref 8.9–10.3)
Chloride: 97 mmol/L — ABNORMAL LOW (ref 98–111)
Creatinine, Ser: 0.75 mg/dL (ref 0.61–1.24)
GFR calc Af Amer: >60 mL/min (ref >60)
GFR calc non Af Amer: >60 mL/min (ref >60)
Glucose, Bld: 101 mg/dL — ABNORMAL HIGH (ref 70–99)
Potassium: 3.6 mmol/L (ref 3.5–5.1)
Sodium: 135 mmol/L (ref 135–145)

## 2018-02-05 LAB — GLUCOSE, CAPILLARY
GLUCOSE-CAPILLARY: 239 mg/dL — AB (ref 70–99)
GLUCOSE-CAPILLARY: 158 mg/dL — AB (ref 70–99)
Glucose-Capillary: 51 mg/dL — ABNORMAL LOW (ref 70–99)
Glucose-Capillary: 92 mg/dL (ref 70–99)
Glucose-Capillary: 137 mg/dL — ABNORMAL HIGH (ref 70–99)

## 2018-02-05 LAB — TYPE AND SCREEN
ABO/RH(D): O POS
Antibody Screen: NEGATIVE
Unit division: 0

## 2018-02-05 LAB — HEMOGLOBIN AND HEMATOCRIT, BLOOD
HCT: 30.4 % — ABNORMAL LOW (ref 39.0–52.0)
Hemoglobin: 9.1 g/dL — ABNORMAL LOW (ref 13.0–17.0)

## 2018-02-05 LAB — BPAM RBC
Blood Product Expiration Date: 202001102359
ISSUE DATE / TIME: 201912170753
Unit Type and Rh: 5100

## 2018-02-05 LAB — OCCULT BLOOD X 1 CARD TO LAB, STOOL: Fecal Occult Bld: NEGATIVE

## 2018-02-05 MED ORDER — POTASSIUM CHLORIDE CRYS ER 20 MEQ PO TBCR
40.0000 meq | EXTENDED_RELEASE_TABLET | Freq: Once | ORAL | Status: AC
  Administered 2018-02-05: 40 meq via ORAL
  Filled 2018-02-05: qty 2

## 2018-02-05 MED ORDER — FUROSEMIDE 10 MG/ML IJ SOLN
40.0000 mg | Freq: Every day | INTRAMUSCULAR | Status: DC
  Administered 2018-02-05 – 2018-02-06 (×2): 40 mg via INTRAVENOUS
  Filled 2018-02-05 (×2): qty 4

## 2018-02-05 NOTE — Progress Notes (Signed)
Physical Therapy Treatment Patient Details Name: Anthony Dunlap MRN: [id number] DOB: June 15, 1933 Today's Date: 02/05/2018    History of Present Illness 84 y.o. male with medical history significant for hypothyroidism, insulin-dependent diabetes mellitus, hyperlipidemia, and history of prostate cancer with chronic Foley catheter, now presenting to the emergency department for evaluation of rigors.  Patient reports that he was in his usual state of health until overnight when he began sweating and developed shaking chills.  In the ED, pt was febrile with HR in 140s.  He also had serum glucose of 440 with anion gap of 12.  Pt admitted with sepsis and CT revealed muliple intramuscular abscesses of the abdomen and pelvis.  Pt is s/p drainage placement on 12/7 and drainage placement on 12/11.     PT Comments    Pt making good progress towards his goals today, however continues to be limited in safe mobility by increased pain, and decreased strength and ROM in his LE. Pt up in recliner on arrival and agreeable to practicing sit>stands, able to come to standing with maxAx2 using Stedy. Pt able to hold standing for 30 seconds initially. Pt was incontinent of stool on subsequent attempts at sit>stand and was able to eventually stand for over a minute for pericare. D/c plans remain appropriate at this time. PT will continue to follow acutely.     Follow Up Recommendations  SNF;Supervision/Assistance - 24 hour        Recommendations for Other Services OT consult     Precautions / Restrictions Precautions Precautions: Fall Precaution Comments: 2 jackson prat drains in place  Restrictions Weight Bearing Restrictions: No    Mobility  Bed Mobility               General bed mobility comments: received up in chair. STEDY with Nursing earlier.   Transfers Overall transfer level: Needs assistance   Transfers: Sit to/from Stand Sit to Stand: +2 physical assistance;Max assist          General transfer comment: maxAx2 for 5x sit>stand with standing bouts of 30 sec to 1 min, pt with BM during 3rd attempt to stand and required additional 2 sit>stand for cleaning before sitting back in recliner, pt requested to sit up longer in recliner after PT(requires 4 attempts to come up, then is able to toelrate partial standing 5x20 seconds. )  Ambulation/Gait             General Gait Details: unable         Balance           Standing balance support: During functional activity;Bilateral upper extremity supported Standing balance-Leahy Scale: Poor                              Cognition Arousal/Alertness: Awake/alert Behavior During Therapy: WFL for tasks assessed/performed Overall Cognitive Status: Within Functional Limits for tasks assessed                                           General Comments General comments (skin integrity, edema, etc.): Pt daughter present during session. Pt had BM during treatment session, stool continues to consist of clear gelatinous fluid      Pertinent Vitals/Pain Pain Assessment: Faces Faces Pain Scale: Hurts whole lot Pain Location: bilateral LE with standing Pain Descriptors / Indicators: Grimacing Pain Intervention(s): Limited  activity within patient's tolerance;Monitored during session;Repositioned           PT Goals (current goals can now be found in the care plan section) Acute Rehab PT Goals Patient Stated Goal: return home with family to assist PT Goal Formulation: With patient Time For Goal Achievement: 02/11/18 Potential to Achieve Goals: Poor Progress towards PT goals: Progressing toward goals    Frequency    Min 3X/week      PT Plan Current plan remains appropriate       AM-PAC PT "6 Clicks" Mobility   Outcome Measure  Help needed turning from your back to your side while in a flat bed without using bedrails?: Total Help needed moving from lying on your back to  sitting on the side of a flat bed without using bedrails?: Total Help needed moving to and from a bed to a chair (including a wheelchair)?: Total Help needed standing up from a chair using your arms (e.g., wheelchair or bedside chair)?: Total Help needed to walk in hospital room?: Total Help needed climbing 3-5 steps with a railing? : Total 6 Click Score: 6    End of Session Equipment Utilized During Treatment: Gait belt Activity Tolerance: Patient limited by fatigue;Patient tolerated treatment well Patient left: in chair;with call bell/phone within reach;with family/visitor present Nurse Communication: Mobility status PT Visit Diagnosis: Unsteadiness on feet (R26.81);Other abnormalities of gait and mobility (R26.89);Muscle weakness (generalized) (M62.81)     Time: 2683-4196 PT Time Calculation (min) (ACUTE ONLY): 56 min  Charges:  $Therapeutic Exercise: 8-22 mins $Therapeutic Activity: 38-52 mins                     Francessca Friis B. Migdalia Dk PT, DPT Acute Rehabilitation Services Pager [phone] Office [phone]    [place] 02/05/2018, 4:57 PM

## 2018-02-05 NOTE — Progress Notes (Signed)
PT Cancellation Note  Patient Details Name: Anthony Dunlap MRN: [id number] DOB: 10-24-1933   Cancelled Treatment:    Reason Eval/Treat Not Completed: (P) Medical issues which prohibited therapy Pt last recorded hemoglobin 6.9, has been transfused but no follow up CBC has been done. RN to contact physician for order. PT will follow back this afternoon.  Tamera Pingley B. Migdalia Dk PT, DPT Acute Rehabilitation Services Pager [phone] Office [phone]  [place] 02/05/2018, 10:46 AM

## 2018-02-05 NOTE — Progress Notes (Signed)
CBG 51.  8 oz of Juice given.  Will recheck CBG and continue to monitor.

## 2018-02-05 NOTE — Plan of Care (Signed)
  Problem: Coping: Goal: Level of anxiety will decrease Outcome: Progressing   Problem: Elimination: Goal: Will not experience complications related to bowel motility Outcome: Progressing   Problem: Pain Managment: Goal: General experience of comfort will improve Outcome: Progressing   

## 2018-02-05 NOTE — Progress Notes (Signed)
  Rectus sheath abscess S/P drain placement by Dr. Kathlene Cote 01/25/2018  Thigh abscess S/P Aspiration and drain placement anterior thigh abscess 01/29/18 by Dr. Laurence Ferrari  Still with copious output from thigh = 200 mL per day recorded. About 5 mL recorded from abdominal drain.  Discussed with Dr. Laurence Ferrari.  He recommends hold of on any follow up imaging until the thigh drainage is down to about 20 mL or so per day.  Continue current drain care to both drains.  Ebelyn Bohnet S Dorris Vangorder PA-C 02/05/2018 3:29 PM

## 2018-02-05 NOTE — Progress Notes (Signed)
PROGRESS NOTE    Anthony Dunlap  CWC:[id number] DOB: 01/11/1934 DOA: 01/17/2018 PCP: [name], MD   Brief Narrative: Patient is 84 year old male with past medical history of hypothyroidism, insulin-dependent diabetes, hyperlipidemia, prostate cancer with chronic Foley catheter who presented with sepsis and DKA.  Blood culture grew Peptostreptococcus and Bacteroides. Wound culture grew Enterococcus faecalis and Klebsiella pneumonia.  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  IR consulted and he  is status post drain placement.  Patient was also seen by infectious disease who recommended 8 weeks of IV antibiotics.  DKA has resolved. Hemoglobin dropped  to 6.8. so he was transfused with one unit of PRBC on 02/04/18.  Assessment & Plan:   Principal Problem:   Enterococcus faecalis infection Active Problems:   Sepsis secondary to UTI ([place])   Hypothyroidism   DKA (diabetic ketoacidoses) ([name])   Normocytic anemia   Sepsis due to Bacteroides species ([name])   Sepsis without acute organ dysfunction ([name])   Psoas abscess ([name])   Abscess  Bacteremia/sepsis: Blood cultures grew bacteroids and Peptostreptococcus.  Wound cultures grew enterococcus and Klebsiella pneumonia.  CT abdomen/pelvis showed osteomyelitis of the pubic symphysis with abscess in the rectus sheath and left thigh.  Status post drains placement.  Rectus sheath abscess-S/P drain placement by Dr. Kathlene Cote 01/25/2018 Thigh abscess-S/P Aspiration and drain placement anterior thigh abscess 01/29/18 by Dr. Laurence Ferrari  IR following.  Plan is to continue IV Unasyn for total of 8 weeks.  Still has significant amount of drainage from thigh. Echocardiogram was unrevealing for endocarditis. Leukocytosis improved today. We will put a PICC line when he is closing the discharge date. IR planning to repeat CT when abdominal drain on the right side has about 20 mL or  less of output.  DKA: Resolved.  Insulin drip was stopped.  Transition to Levemir and sliding scale insulin.  Hypothyroidism: Continue Synthyroid  Prostate cancer: Has chronic Foley catheter placed  History of reactive thrombocytosis: On aspirin 81 mg daily.  Will change aspirin to 81 mg from 325 mg.  Hyperlipidemia: Continue statin  Lower extremity edema: Continue IV lasix for now.  Hypokalemia/hypomagnesemia: Currently being supplemented.  Will check magnesium  Anemia: Hemoglobin dropped to 6.8.  Most likely associated with his chronic medical problems.  No hematochezia or melena.    Transfused with 1 unit of PRBC on 02/04/18. FOBT has been sent.His stool was reported to be clear jelly like.         DVT prophylaxis: SCD Code Status: Full Family Communication: None present at the bedside Disposition Plan: Skilled nursing facility after clearance from IR.   Consultants: IR, ID  Procedures: Placement of drains  Antimicrobials: Currently on Unasyn since 12/7  Subjective: Patient seen and examined the bedside this afternoon.  Remains hemodynamically stable.  Denies any specific complaints.  Feels better.  Thigh drain is still draining significant amount of fluid.Worked with PT today.  Still has significant lower extremity edema  Objective: Vitals:   02/04/18 1056 02/04/18 1406 02/04/18 2231 02/05/18 0602  BP: 116/66 136/72 (!) 143/79 (!) 143/84  Pulse: (!) 109 (!) 105 (!) 112 (!) 112  Resp: 16 18 18 18   Temp: 99 F (37.2 C) 99.1 F (37.3 C) 98.8 F (37.1 C) 98.7 F (37.1 C)  TempSrc: Oral Oral  Oral  SpO2: 98% 99% 97% 100%  Weight:      Height:        Intake/Output Summary (  Last 24 hours) at 02/05/2018 1438 Last data filed at 02/05/2018 1215 Gross per 24 hour  Intake 1030 ml  Output 2496 ml  Net -1466 ml   Filed Weights   01/17/18 0209 01/17/18 0600 01/23/18 2337  Weight: 70.3 kg 67.1 kg 72.9 kg    Examination:  General exam: Appears calm and  comfortable ,Not in distress,average built HEENT:PERRL,Oral mucosa moist, Ear/Nose normal on gross exam Respiratory system: Bilateral equal air entry, normal vesicular breath sounds, no wheezes or crackles  Cardiovascular system: S1 & S2 heard, RRR. No JVD, murmurs, rubs, gallops or clicks. No pedal edema. Gastrointestinal system: Abdomen is nondistended, soft and nontender. No organomegaly or masses felt. Normal bowel sounds heard.Abdominal drain with clear fluid Central nervous system: Alert and oriented. No focal neurological deficits. Extremities: Severe lower extremity edema, no clubbing ,no cyanosis, distal peripheral pulses palpable.Drain on the left upper thigh draining serosanguinous fluid Psychiatry: Judgement and insight appear normal. Mood & affect appropriate.     Data Reviewed: I have personally reviewed following labs and imaging studies  CBC: Recent Labs  Lab 01/30/18 0225 01/31/18 1041 02/01/18 0454 02/02/18 0232 02/03/18 0610 02/04/18 0231 02/05/18 1033  WBC 19.3* 27.9* 17.6* 16.9* 17.9* 14.6*  --   NEUTROABS 16.0*  --   --   --   --   --   --   HGB 7.5* 8.4* 7.3* 7.2* 7.7* 6.8* 9.1*  HCT 26.0* 27.6* 24.7* 24.4* 26.2* 22.9* 30.4*  MCV 80.0 80.0 79.4* 79.2* 79.6* 79.5*  --   PLT 883* 1,259* 946* 879* 940* 777*  --    Basic Metabolic Panel: Recent Labs  Lab 02/02/18 0232 02/02/18 1816 02/03/18 0610 02/04/18 0231 02/05/18 0444  NA 132* 132* 133* 132* 135  K 2.5* 3.0* 3.4* 2.8* 3.6  CL 94* 95* 94* 96* 97*  CO2 26 23 26 24 26   GLUCOSE 145* 182* 181* 220* 101*  BUN 7* 7* 8 9 9   CREATININE 0.71 0.65 0.78 0.71 0.75  CALCIUM 7.2* 7.2* 7.5* 7.4* 7.7*  MG  --   --   --  1.6*  --    GFR: Estimated Creatinine Clearance: 68.9 mL/min (by C-G formula based on SCr of 0.75 mg/dL). Liver Function Tests: Recent Labs  Lab 01/30/18 0225 01/31/18 1041  AST 38 56*  ALT 31 31  ALKPHOS 249* 330*  BILITOT 0.3 0.8  PROT 5.7* 6.4*  ALBUMIN 1.4* 1.6*   No results for  input(s): LIPASE, AMYLASE in the last 168 hours. No results for input(s): AMMONIA in the last 168 hours. Coagulation Profile: No results for input(s): INR, PROTIME in the last 168 hours. Cardiac Enzymes: No results for input(s): CKTOTAL, CKMB, CKMBINDEX, TROPONINI in the last 168 hours. BNP (last 3 results) No results for input(s): PROBNP in the last 8760 hours. HbA1C: No results for input(s): HGBA1C in the last 72 hours. CBG: Recent Labs  Lab 02/04/18 1726 02/04/18 2230 02/05/18 0816 02/05/18 0845 02/05/18 1142  GLUCAP 105* 218* 51* 92 137*   Lipid Profile: No results for input(s): CHOL, HDL, LDLCALC, TRIG, CHOLHDL, LDLDIRECT in the last 72 hours. Thyroid Function Tests: No results for input(s): TSH, T4TOTAL, FREET4, T3FREE, THYROIDAB in the last 72 hours. Anemia Panel: No results for input(s): VITAMINB12, FOLATE, FERRITIN, TIBC, IRON, RETICCTPCT in the last 72 hours. Sepsis Labs: No results for input(s): PROCALCITON, LATICACIDVEN in the last 168 hours.  Recent Results (from the past 240 hour(s))  Surgical pcr screen     Status: Abnormal  Collection Time: 01/28/18  1:15 AM  Result Value Ref Range Status   MRSA, PCR POSITIVE (A) NEGATIVE Final    Comment: RESULT CALLED TO, READ BACK BY AND VERIFIED WITH: HARGROVE,C RN 0308 01/28/18 MITCHELL,L    Staphylococcus aureus POSITIVE (A) NEGATIVE Final    Comment: (NOTE) The Xpert SA Assay (FDA approved for NASAL specimens in patients 64 years of age and older), is one component of a comprehensive surveillance program. It is not intended to diagnose infection nor to guide or monitor treatment.   Aerobic/Anaerobic Culture (surgical/deep wound)     Status: None   Collection Time: 01/29/18  3:27 PM  Result Value Ref Range Status   Specimen Description ABSCESS LEFT THIGH  Final   Special Requests NONE  Final   Gram Stain   Final    ABUNDANT WBC PRESENT, PREDOMINANTLY PMN FEW GRAM POSITIVE COCCI RARE GRAM POSITIVE RODS     Culture   Final    FEW ENTEROCOCCUS FAECALIS RARE KLEBSIELLA PNEUMONIAE NO ANAEROBES ISOLATED Performed at [facility], [address], [place], [place] [address]    Report Status 02/03/2018 FINAL  Final   Organism ID, Bacteria ENTEROCOCCUS FAECALIS  Final   Organism ID, Bacteria KLEBSIELLA PNEUMONIAE  Final      Susceptibility   Enterococcus faecalis - MIC*    AMPICILLIN <=2 SENSITIVE Sensitive     VANCOMYCIN 1 SENSITIVE Sensitive     GENTAMICIN SYNERGY RESISTANT Resistant     * FEW ENTEROCOCCUS FAECALIS   Klebsiella pneumoniae - MIC*    AMPICILLIN >=32 RESISTANT Resistant     CEFAZOLIN <=4 SENSITIVE Sensitive     CEFEPIME <=1 SENSITIVE Sensitive     CEFTAZIDIME <=1 SENSITIVE Sensitive     CEFTRIAXONE <=1 SENSITIVE Sensitive     CIPROFLOXACIN <=0.25 SENSITIVE Sensitive     GENTAMICIN <=1 SENSITIVE Sensitive     IMIPENEM <=0.25 SENSITIVE Sensitive     TRIMETH/SULFA <=20 SENSITIVE Sensitive     AMPICILLIN/SULBACTAM 8 SENSITIVE Sensitive     PIP/TAZO <=4 SENSITIVE Sensitive     Extended ESBL NEGATIVE Sensitive     * RARE KLEBSIELLA PNEUMONIAE  Culture, blood (routine x 2)     Status: None (Preliminary result)   Collection Time: 02/01/18 12:50 PM  Result Value Ref Range Status   Specimen Description BLOOD RIGHT HAND  Final   Special Requests   Final    BOTTLES DRAWN AEROBIC ONLY Blood Culture adequate volume   Culture   Final    NO GROWTH 4 DAYS Performed at [facility], [address], [place], [place] [address]    Report Status PENDING  Incomplete  Culture, blood (routine x 2)     Status: None (Preliminary result)   Collection Time: 02/01/18 12:52 PM  Result Value Ref Range Status   Specimen Description BLOOD RIGHT WRIST  Final   Special Requests   Final    BOTTLES DRAWN AEROBIC ONLY Blood Culture adequate volume   Culture   Final    NO GROWTH 4 DAYS Performed at [facility], [address], [place], [place] [address]    Report Status PENDING   Incomplete         Radiology Studies: No results found.      Scheduled Meds: . sodium chloride   Intravenous Once  . aspirin  81 mg Oral Daily  . enoxaparin (LOVENOX) injection  40 mg Subcutaneous Q24H  . feeding supplement (GLUCERNA SHAKE)  237 mL Oral BID  BM  . insulin aspart  0-9 Units Subcutaneous TID WC  . insulin detemir  20 Units Subcutaneous QHS  . levothyroxine  25 mcg Oral Q0600  . magnesium oxide  400 mg Oral Daily  . povidone-iodine  2 application Topical Once  . pravastatin  10 mg Oral q1800  . sodium chloride flush  5 mL Intracatheter Q8H  . sodium chloride flush  5 mL Intracatheter Q8H   Continuous Infusions: . sodium chloride Stopped (01/31/18 1800)  . ampicillin-sulbactam (UNASYN) IV 3 g (02/05/18 1105)     LOS: 19 days    Time spent: 35 mins.More than 50% of that time was spent in counseling and/or coordination of care.      Shelly Coss, MD [facility] Pager [phone]  If 7PM-7AM, please contact night-coverage [email or web address] Password [facility] 02/05/2018, 2:38 PM

## 2018-02-06 LAB — GLUCOSE, CAPILLARY
Glucose-Capillary: 53 mg/dL — ABNORMAL LOW (ref 70–99)
Glucose-Capillary: 93 mg/dL (ref 70–99)
Glucose-Capillary: 173 mg/dL — ABNORMAL HIGH (ref 70–99)
Glucose-Capillary: 223 mg/dL — ABNORMAL HIGH (ref 70–99)
Glucose-Capillary: 161 mg/dL — ABNORMAL HIGH (ref 70–99)

## 2018-02-06 LAB — CULTURE, BLOOD (ROUTINE X 2)
Culture: NO GROWTH
Culture: NO GROWTH
Special Requests: ADEQUATE
Special Requests: ADEQUATE

## 2018-02-06 LAB — CBC WITH DIFFERENTIAL/PLATELET
ABS IMMATURE GRANULOCYTES: 0.07 10*3/uL (ref 0.00–0.07)
BASOS PCT: 0 %
Basophils Absolute: 0 10*3/uL (ref 0.0–0.1)
Eosinophils Absolute: 0.1 10*3/uL (ref 0.0–0.5)
Eosinophils Relative: 1 %
HCT: 26 % — ABNORMAL LOW (ref 39.0–52.0)
Hemoglobin: 8.2 g/dL — ABNORMAL LOW (ref 13.0–17.0)
Immature Granulocytes: 1 %
Lymphocytes Relative: 14 %
Lymphs Abs: 1.7 10*3/uL (ref 0.7–4.0)
MCH: 25.3 pg — AB (ref 26.0–34.0)
MCHC: 31.5 g/dL (ref 30.0–36.0)
MCV: 80.2 fL (ref 80.0–100.0)
Monocytes Absolute: 0.8 10*3/uL (ref 0.1–1.0)
Monocytes Relative: 7 %
Neutro Abs: 9.9 10*3/uL — ABNORMAL HIGH (ref 1.7–7.7)
Neutrophils Relative %: 77 %
Platelets: 782 10*3/uL — ABNORMAL HIGH (ref 150–400)
RBC: 3.24 MIL/uL — ABNORMAL LOW (ref 4.22–5.81)
RDW: 15 % (ref 11.5–15.5)
WBC: 12.7 10*3/uL — ABNORMAL HIGH (ref 4.0–10.5)
nRBC: 0 % (ref 0.0–0.2)

## 2018-02-06 LAB — BASIC METABOLIC PANEL
ANION GAP: 11 (ref 5–15)
BUN: 9 mg/dL (ref 8–23)
CO2: 28 mmol/L (ref 22–32)
Calcium: 7.5 mg/dL — ABNORMAL LOW (ref 8.9–10.3)
Chloride: 96 mmol/L — ABNORMAL LOW (ref 98–111)
Creatinine, Ser: 0.64 mg/dL (ref 0.61–1.24)
GFR calc Af Amer: >60 mL/min (ref >60)
GFR calc non Af Amer: >60 mL/min (ref >60)
Glucose, Bld: 130 mg/dL — ABNORMAL HIGH (ref 70–99)
POTASSIUM: 3.1 mmol/L — AB (ref 3.5–5.1)
Sodium: 135 mmol/L (ref 135–145)

## 2018-02-06 LAB — MAGNESIUM: Magnesium: 1.8 mg/dL (ref 1.7–2.4)

## 2018-02-06 MED ORDER — POTASSIUM CHLORIDE CRYS ER 20 MEQ PO TBCR
40.0000 meq | EXTENDED_RELEASE_TABLET | ORAL | Status: DC
  Administered 2018-02-06: 40 meq via ORAL
  Filled 2018-02-06: qty 2

## 2018-02-06 MED ORDER — POTASSIUM CHLORIDE CRYS ER 20 MEQ PO TBCR
40.0000 meq | EXTENDED_RELEASE_TABLET | Freq: Once | ORAL | Status: AC
  Administered 2018-02-06: 40 meq via ORAL
  Filled 2018-02-06: qty 2

## 2018-02-06 MED ORDER — INSULIN DETEMIR 100 UNIT/ML ~~LOC~~ SOLN
15.0000 [IU] | Freq: Every day | SUBCUTANEOUS | Status: DC
  Administered 2018-02-06: 15 [IU] via SUBCUTANEOUS
  Filled 2018-02-06: qty 0.15

## 2018-02-06 NOTE — Progress Notes (Signed)
PROGRESS NOTE    Anthony Dunlap  HKV:[id number] DOB: [birth year] DOA: 01/17/2018 PCP: [name], MD   Brief Narrative: Patient is 84 year old male with past medical history of hypothyroidism, insulin-dependent diabetes, hyperlipidemia, prostate cancer with chronic Foley catheter who presented with sepsis and DKA.  Blood culture grew Peptostreptococcus and Bacteroides. Wound culture grew Enterococcus faecalis and Klebsiella pneumonia.  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  IR consulted and he  is status post drain placement.  Patient was also seen by infectious disease who recommended 8 weeks of IV antibiotics.  DKA has resolved. Hemoglobin dropped  to 6.8. so he was transfused with one unit of PRBC on 02/04/18.  Assessment & Plan:   Principal Problem:   Enterococcus faecalis infection Active Problems:   Sepsis secondary to UTI ([place])   Hypothyroidism   DKA (diabetic ketoacidoses) ([name])   Normocytic anemia   Sepsis due to Bacteroides species ([name])   Sepsis without acute organ dysfunction ([name])   Psoas abscess ([name])   Abscess  Bacteremia/sepsis: Blood cultures grew bacteroids and Peptostreptococcus.  Wound cultures grew enterococcus and Klebsiella pneumonia.  CT abdomen/pelvis showed osteomyelitis of the pubic symphysis with abscess in the rectus sheath and left thigh.  Status post drains placement.  Rectus sheath abscess-S/P drain placement by Dr. Kathlene Cote 01/25/2018 Thigh abscess-S/P Aspiration and drain placement anterior thigh abscess 01/29/18 by Dr. Laurence Ferrari  IR following.  Plan is to continue IV Unasyn for total of 8 weeks.  Still has significant amount of drainage from thigh. Echocardiogram was unrevealing for endocarditis. Leukocytosis improved . We will put a PICC line when he is closing the discharge date. IR planning to repeat CT when thigh drain has about 20 mL or less of output.  DKA:  Resolved.  Insulin drip was stopped.  Transition to Levemir and sliding scale insulin.  Hypothyroidism: Continue Synthyroid  Prostate cancer: Has chronic Foley catheter placed  History of reactive thrombocytosis: On aspirin 81 mg daily.    Hyperlipidemia: Continue statin  Lower extremity edema: Continue IV lasix for now.  Lower extremity edema has significantly improved today.  Hypokalemia/hypomagnesemia: Currently being supplemented.  Will check magnesium  Anemia: Hemoglobin dropped to 6.8.  Most likely associated with his chronic medical problems.  No hematochezia or melena.    Transfused with 1 unit of PRBC on 02/04/18. FOBT  sent and found to be negative.His stool was reported to be clear jelly like.         DVT prophylaxis: SCD Code Status: Full Family Communication: None present at the bedside Disposition Plan: Skilled nursing facility after clearance from IR( likely after) removal of drain   Consultants: IR, ID  Procedures: Placement of drains  Antimicrobials: Currently on Unasyn since 12/7  Subjective: Patient seen and examined the bedside this afternoon.  Remains hemodynamically stable.  Denies any specific complaints.  Feels better.  Thigh drain is still draining significant amount of fluid.Improved lower extremity edema.  Objective: Vitals:   02/05/18 0602 02/05/18 1542 02/05/18 2230 02/06/18 0553  BP: (!) 143/84 129/76 (!) 137/91 (!) 143/88  Pulse: (!) 112 (!) 110 (!) 108 97  Resp: 18 17 16 17   Temp: 98.7 F (37.1 C)  99.1 F (37.3 C) 98.8 F (37.1 C)  TempSrc: Oral  Oral Oral  SpO2: 100% 99% 98% 99%  Weight:      Height:        Intake/Output Summary (Last 24 hours) at 02/06/2018  1333 Last data filed at 02/06/2018 0557 Gross per 24 hour  Intake 530 ml  Output 3793 ml  Net -3263 ml   Filed Weights   01/17/18 0209 01/17/18 0600 01/23/18 2337  Weight: 70.3 kg 67.1 kg 72.9 kg    Examination:  General exam: Appears calm and comfortable ,Not in  distress,average built HEENT:PERRL,Oral mucosa moist, Ear/Nose normal on gross exam Respiratory system: Bilateral equal air entry, normal vesicular breath sounds, no wheezes or crackles  Cardiovascular system: S1 & S2 heard, RRR. No JVD, murmurs, rubs, gallops or clicks. No pedal edema. Gastrointestinal system: Abdomen is nondistended, soft and nontender. No organomegaly or masses felt. Normal bowel sounds heard.Abdominal drain with clear fluid Central nervous system: Alert and oriented. No focal neurological deficits. Extremities: Improving lower extremity edema, no clubbing ,no cyanosis, distal peripheral pulses palpable.Drain on the left upper thigh draining serosanguinous fluid Psychiatry: Judgement and insight appear normal. Mood & affect appropriate.     Data Reviewed: I have personally reviewed following labs and imaging studies  CBC: Recent Labs  Lab 02/01/18 0454 02/02/18 0232 02/03/18 0610 02/04/18 0231 02/05/18 1033 02/06/18 0254  WBC 17.6* 16.9* 17.9* 14.6*  --  12.7*  NEUTROABS  --   --   --   --   --  9.9*  HGB 7.3* 7.2* 7.7* 6.8* 9.1* 8.2*  HCT 24.7* 24.4* 26.2* 22.9* 30.4* 26.0*  MCV 79.4* 79.2* 79.6* 79.5*  --  80.2  PLT 946* 879* 940* 777*  --  950*   Basic Metabolic Panel: Recent Labs  Lab 02/02/18 1816 02/03/18 0610 02/04/18 0231 02/05/18 0444 02/06/18 0254  NA 132* 133* 132* 135 135  K 3.0* 3.4* 2.8* 3.6 3.1*  CL 95* 94* 96* 97* 96*  CO2 23 26 24 26 28   GLUCOSE 182* 181* 220* 101* 130*  BUN 7* 8 9 9 9   CREATININE 0.65 0.78 0.71 0.75 0.64  CALCIUM 7.2* 7.5* 7.4* 7.7* 7.5*  MG  --   --  1.6*  --  1.8   GFR: Estimated Creatinine Clearance: 68.9 mL/min (by C-G formula based on SCr of 0.64 mg/dL). Liver Function Tests: Recent Labs  Lab 01/31/18 1041  AST 56*  ALT 31  ALKPHOS 330*  BILITOT 0.8  PROT 6.4*  ALBUMIN 1.6*   No results for input(s): LIPASE, AMYLASE in the last 168 hours. No results for input(s): AMMONIA in the last 168  hours. Coagulation Profile: No results for input(s): INR, PROTIME in the last 168 hours. Cardiac Enzymes: No results for input(s): CKTOTAL, CKMB, CKMBINDEX, TROPONINI in the last 168 hours. BNP (last 3 results) No results for input(s): PROBNP in the last 8760 hours. HbA1C: No results for input(s): HGBA1C in the last 72 hours. CBG: Recent Labs  Lab 02/05/18 1712 02/05/18 2224 02/06/18 0757 02/06/18 0845 02/06/18 1223  GLUCAP 239* 158* 53* 93 173*   Lipid Profile: No results for input(s): CHOL, HDL, LDLCALC, TRIG, CHOLHDL, LDLDIRECT in the last 72 hours. Thyroid Function Tests: No results for input(s): TSH, T4TOTAL, FREET4, T3FREE, THYROIDAB in the last 72 hours. Anemia Panel: No results for input(s): VITAMINB12, FOLATE, FERRITIN, TIBC, IRON, RETICCTPCT in the last 72 hours. Sepsis Labs: No results for input(s): PROCALCITON, LATICACIDVEN in the last 168 hours.  Recent Results (from the past 240 hour(s))  Surgical pcr screen     Status: Abnormal   Collection Time: 01/28/18  1:15 AM  Result Value Ref Range Status   MRSA, PCR POSITIVE (A) NEGATIVE Final    Comment: RESULT  CALLED TO, READ BACK BY AND VERIFIED WITH: HARGROVE,C RN 0308 01/28/18 MITCHELL,L    Staphylococcus aureus POSITIVE (A) NEGATIVE Final    Comment: (NOTE) The Xpert SA Assay (FDA approved for NASAL specimens in patients 20 years of age and older), is one component of a comprehensive surveillance program. It is not intended to diagnose infection nor to guide or monitor treatment.   Aerobic/Anaerobic Culture (surgical/deep wound)     Status: None   Collection Time: 01/29/18  3:27 PM  Result Value Ref Range Status   Specimen Description ABSCESS LEFT THIGH  Final   Special Requests NONE  Final   Gram Stain   Final    ABUNDANT WBC PRESENT, PREDOMINANTLY PMN FEW GRAM POSITIVE COCCI RARE GRAM POSITIVE RODS    Culture   Final    FEW ENTEROCOCCUS FAECALIS RARE KLEBSIELLA PNEUMONIAE NO ANAEROBES  ISOLATED Performed at [facility], [address] [place], [place] [address]    Report Status 02/03/2018 FINAL  Final   Organism ID, Bacteria ENTEROCOCCUS FAECALIS  Final   Organism ID, Bacteria KLEBSIELLA PNEUMONIAE  Final      Susceptibility   Enterococcus faecalis - MIC*    AMPICILLIN <=2 SENSITIVE Sensitive     VANCOMYCIN 1 SENSITIVE Sensitive     GENTAMICIN SYNERGY RESISTANT Resistant     * FEW ENTEROCOCCUS FAECALIS   Klebsiella pneumoniae - MIC*    AMPICILLIN >=32 RESISTANT Resistant     CEFAZOLIN <=4 SENSITIVE Sensitive     CEFEPIME <=1 SENSITIVE Sensitive     CEFTAZIDIME <=1 SENSITIVE Sensitive     CEFTRIAXONE <=1 SENSITIVE Sensitive     CIPROFLOXACIN <=0.25 SENSITIVE Sensitive     GENTAMICIN <=1 SENSITIVE Sensitive     IMIPENEM <=0.25 SENSITIVE Sensitive     TRIMETH/SULFA <=20 SENSITIVE Sensitive     AMPICILLIN/SULBACTAM 8 SENSITIVE Sensitive     PIP/TAZO <=4 SENSITIVE Sensitive     Extended ESBL NEGATIVE Sensitive     * RARE KLEBSIELLA PNEUMONIAE  Culture, blood (routine x 2)     Status: None   Collection Time: 02/01/18 12:50 PM  Result Value Ref Range Status   Specimen Description BLOOD RIGHT HAND  Final   Special Requests   Final    BOTTLES DRAWN AEROBIC ONLY Blood Culture adequate volume   Culture   Final    NO GROWTH 5 DAYS Performed at [facility], [place] [address] [place], [place] [address]    Report Status 02/06/2018 FINAL  Final  Culture, blood (routine x 2)     Status: None   Collection Time: 02/01/18 12:52 PM  Result Value Ref Range Status   Specimen Description BLOOD RIGHT WRIST  Final   Special Requests   Final    BOTTLES DRAWN AEROBIC ONLY Blood Culture adequate volume   Culture   Final    NO GROWTH 5 DAYS Performed at [facility], [place] [address], [place], [place] [address]    Report Status 02/06/2018 FINAL  Final         Radiology Studies: No results found.      Scheduled Meds: . sodium chloride   Intravenous  Once  . aspirin  81 mg Oral Daily  . enoxaparin (LOVENOX) injection  40 mg Subcutaneous Q24H  . feeding supplement (GLUCERNA SHAKE)  237 mL Oral BID BM  . furosemide  40 mg Intravenous Daily  . insulin aspart  0-9 Units Subcutaneous TID WC  . insulin detemir  15 Units Subcutaneous  QHS  . levothyroxine  25 mcg Oral Q0600  . magnesium oxide  400 mg Oral Daily  . potassium chloride  40 mEq Oral Once  . povidone-iodine  2 application Topical Once  . pravastatin  10 mg Oral q1800  . sodium chloride flush  5 mL Intracatheter Q8H  . sodium chloride flush  5 mL Intracatheter Q8H   Continuous Infusions: . sodium chloride Stopped (01/31/18 1800)  . ampicillin-sulbactam (UNASYN) IV 3 g (02/06/18 1012)     LOS: 20 days    Time spent: 35 mins.More than 50% of that time was spent in counseling and/or coordination of care.      Shelly Coss, MD [facility] Pager [phone]  If 7PM-7AM, please contact night-coverage [email or web address] Password [email or web address] 02/06/2018, 1:33 PM

## 2018-02-06 NOTE — Progress Notes (Signed)
  Referring Physician(s): Matthews,E    Supervising Physician: Markus Daft  Patient Status:  [facility] - In-pt  Chief Complaint:  Abdominal/left thigh abscesses  Subjective: Patient without acute changes.  Denies worsening abdominal or lower extremity pain; sitting up in chair   Allergies: Patient has no known allergies.  Medications: Prior to Admission medications   Medication Sig Start Date End Date Taking? Authorizing Provider  aspirin EC 81 MG tablet Take 81 mg by mouth daily.   Yes [provider]  LEVEMIR FLEXTOUCH 100 UNIT/ML Pen Inject 30 Units into the skin daily. 01/07/18  Yes Kathie Dike, MD  levothyroxine (SYNTHROID, LEVOTHROID) 25 MCG tablet Take 25 mcg by mouth daily before breakfast.   Yes [provider]  pravastatin (PRAVACHOL) 10 MG tablet Take 10 mg by mouth daily.   Yes [provider]     Vital Signs: BP (!) 143/88 (BP Location: Left Arm)   Pulse 97   Temp 98.8 F (37.1 C) (Oral)   Resp 17   Ht 5\' 8"  (1.727 m)   Wt 160 lb 11.5 oz (72.9 kg)   SpO2 99%   BMI 24.44 kg/m   Physical Exam left thigh/rt abd drains intact, dressings dry, sites not sig tender, outputs 200 cc purulent blood-tinged fluid from left thigh and about 20 cc light yellow fluid from rt abd drain  Imaging: No results found.  Labs:  CBC: Recent Labs    02/02/18 0232 02/03/18 0610 02/04/18 0231 02/05/18 1033 02/06/18 0254  WBC 16.9* 17.9* 14.6*  --  12.7*  HGB 7.2* 7.7* 6.8* 9.1* 8.2*  HCT 24.4* 26.2* 22.9* 30.4* 26.0*  PLT 879* 940* 777*  --  782*    COAGS: Recent Labs    01/03/18 1929 01/17/18 0443 01/25/18 0348  INR 1.20 1.31 1.15  APTT  --  37*  --     BMP: Recent Labs    02/03/18 0610 02/04/18 0231 02/05/18 0444 02/06/18 0254  NA 133* 132* 135 135  K 3.4* 2.8* 3.6 3.1*  CL 94* 96* 97* 96*  CO2 26 24 26 28   GLUCOSE 181* 220* 101* 130*  BUN 8 9 9 9   CALCIUM 7.5* 7.4* 7.7* 7.5*  CREATININE 0.78 0.71 0.75 0.64  GFRNONAA  >60 >60 >60 >60  GFRAA >60 >60 >60 >60    LIVER FUNCTION TESTS: Recent Labs    01/05/18 0551 01/17/18 0237 01/30/18 0225 01/31/18 1041  BILITOT 0.3 0.7 0.3 0.8  AST 21 19 38 56*  ALT 19 15 31 31   ALKPHOS 141* 152* 249* 330*  PROT 7.1 7.2 5.7* 6.4*  ALBUMIN 2.6* 2.5* 1.4* 1.6*    Assessment and Plan: Pt s/p drainage of right rectus sheath abscess 12/7, left thigh abscess 12/11 along with aspiration of additional adductor musculature abscesses; afebrile, WBC 12.7(14.6), hgb 8.2, creat nl; K 3.1- replace; cont current tx; OOB; plan f/u CT once left thigh drain output sig decreases or if clinical status worsens   Electronically Signed: D. Rowe Robert, PA-C 02/06/2018, 10:41 AM   I spent a total of 15 minutes at the the patient's bedside AND on the patient's hospital floor or unit, greater than 50% of which was counseling/coordinating care for right abdominal and left thigh abscess drains    Patient ID: Anthony Dunlap, male   DOB: 08/19/1933, 84 y.o.   MRN: [id number]

## 2018-02-06 NOTE — Progress Notes (Signed)
Inpatient Diabetes Program Recommendations  AACE/ADA: New Consensus Statement on Inpatient Glycemic Control (2015)  Target Ranges:  Prepandial:   less than 140 mg/dL      Peak postprandial:   less than 180 mg/dL (1-2 hours)      Critically ill patients:  140 - 180 mg/dL   Lab Results  Component Value Date   GLUCAP 93 02/06/2018   HGBA1C 9.6 (H) 01/04/2018    Review of Glycemic Control Results for Anthony Dunlap, Anthony Dunlap (MRN [id number]) as of 02/06/2018 10:18  Ref. Range 02/05/2018 22:24 02/06/2018 07:57 02/06/2018 08:45  Glucose-Capillary Latest Ref Range: 70 - 99 mg/dL 158 (H) 53 (L) 93  Results for Anthony Dunlap, Anthony Dunlap (MRN [id number]) as of 02/06/2018 10:18  Ref. Range 02/05/2018 08:16 02/05/2018 08:45 02/05/2018 11:42 02/05/2018 17:12  Glucose-Capillary Latest Ref Range: 70 - 99 mg/dL 51 (L) 92 137 (H) 239 (H)   Current orders for Inpatient glycemic control: Levemir 20 units QHS, Novolog 0-9 units TID  Inpatient Diabetes Program Recommendations:   Noted hypoglycemia of 53 mg/dL on 12/19 and 51 mg/dL on 12/18. Please consider decreasing Levemir to 16 units QHS.  Thanks, Bronson Curb, MSN, RNC-OB Diabetes Coordinator [phone] (8a-5p)

## 2018-02-07 ENCOUNTER — Inpatient Hospital Stay: Payer: Self-pay

## 2018-02-07 LAB — BASIC METABOLIC PANEL
Anion gap: 11 (ref 5–15)
BUN: 9 mg/dL (ref 8–23)
CO2: 27 mmol/L (ref 22–32)
Calcium: 7.7 mg/dL — ABNORMAL LOW (ref 8.9–10.3)
Chloride: 97 mmol/L — ABNORMAL LOW (ref 98–111)
Creatinine, Ser: 0.64 mg/dL (ref 0.61–1.24)
GFR calc Af Amer: >60 mL/min (ref >60)
Glucose, Bld: 60 mg/dL — ABNORMAL LOW (ref 70–99)
POTASSIUM: 3.4 mmol/L — AB (ref 3.5–5.1)
Sodium: 135 mmol/L (ref 135–145)

## 2018-02-07 LAB — GLUCOSE, CAPILLARY
Glucose-Capillary: 59 mg/dL — ABNORMAL LOW (ref 70–99)
Glucose-Capillary: 74 mg/dL (ref 70–99)
Glucose-Capillary: 166 mg/dL — ABNORMAL HIGH (ref 70–99)
Glucose-Capillary: 261 mg/dL — ABNORMAL HIGH (ref 70–99)
Glucose-Capillary: 198 mg/dL — ABNORMAL HIGH (ref 70–99)

## 2018-02-07 MED ORDER — FUROSEMIDE 20 MG PO TABS: 20.0000 mg | ORAL_TABLET | Freq: Every day | ORAL | Status: DC

## 2018-02-07 MED ORDER — SODIUM CHLORIDE 0.9% FLUSH
10.0000 mL | Freq: Two times a day (BID) | INTRAVENOUS | Status: DC
  Administered 2018-02-12 – 2018-02-14 (×3): 10 mL

## 2018-02-07 MED ORDER — POTASSIUM CHLORIDE CRYS ER 20 MEQ PO TBCR
40.0000 meq | EXTENDED_RELEASE_TABLET | Freq: Once | ORAL | Status: AC
  Administered 2018-02-07: 40 meq via ORAL
  Filled 2018-02-07: qty 2

## 2018-02-07 MED ORDER — DOCUSATE SODIUM 100 MG PO CAPS
100.0000 mg | ORAL_CAPSULE | Freq: Two times a day (BID) | ORAL | Status: DC
  Administered 2018-02-07 – 2018-02-10 (×7): 100 mg via ORAL
  Filled 2018-02-07 (×7): qty 1

## 2018-02-07 MED ORDER — SODIUM CHLORIDE 0.9% FLUSH
10.0000 mL | INTRAVENOUS | Status: DC | PRN
  Administered 2018-02-17: 10 mL
  Filled 2018-02-07: qty 40

## 2018-02-07 MED ORDER — INSULIN DETEMIR 100 UNIT/ML ~~LOC~~ SOLN
8.0000 [IU] | Freq: Every day | SUBCUTANEOUS | Status: DC
  Administered 2018-02-07 – 2018-02-16 (×10): 8 [IU] via SUBCUTANEOUS
  Filled 2018-02-07 (×11): qty 0.08

## 2018-02-07 MED ORDER — FUROSEMIDE 40 MG PO TABS
40.0000 mg | ORAL_TABLET | Freq: Every day | ORAL | Status: DC
  Administered 2018-02-07 – 2018-02-09 (×3): 40 mg via ORAL
  Filled 2018-02-07 (×3): qty 1

## 2018-02-07 NOTE — Progress Notes (Signed)
Referring Physician(s): Dr Kathreen Cosier  Supervising Physician: Aletta Edouard  Patient Status:  [facility] - In-pt  Chief Complaint:  Rt rectus sheath abscess drain 12/7 Left thigh abscess drain placed 12/11  Subjective:  OP of rectus sheath has slowed Minimal op: serous color  OP of left thigh still significant Blood tinged   Allergies: Patient has no known allergies.  Medications: Prior to Admission medications   Medication Sig Start Date End Date Taking? Authorizing Provider  aspirin EC 81 MG tablet Take 81 mg by mouth daily.   Yes [provider]  LEVEMIR FLEXTOUCH 100 UNIT/ML Pen Inject 30 Units into the skin daily. 01/07/18  Yes Kathie Dike, MD  levothyroxine (SYNTHROID, LEVOTHROID) 25 MCG tablet Take 25 mcg by mouth daily before breakfast.   Yes [provider]  pravastatin (PRAVACHOL) 10 MG tablet Take 10 mg by mouth daily.   Yes [provider]     Vital Signs: BP (!) 142/83 (BP Location: Left Arm)   Pulse (!) 109   Temp 98.6 F (37 C) (Oral)   Resp 20   Ht 5\' 8"  (1.727 m)   Wt 160 lb 11.5 oz (72.9 kg)   SpO2 97%   BMI 24.44 kg/m   Physical Exam Musculoskeletal: Normal range of motion.  Skin:    General: Skin is warm and dry.     Comments: Rectus sheat OP minimal serous color ENTEROCOCCUS FAECALIS   Left thigh OP significant Blood tinged ENTEROCOCCUS FAECALIS   Organism ID, Bacteria KLEBSIELLA PNEUMONIAE  Sites are clean and dry  Neurological:     Mental Status: He is alert.     Imaging: [place] Ekg Site Rite  Result Date: 02/07/2018 If Site Rite image not attached, placement could not be confirmed due to current cardiac rhythm.   Labs:  CBC: Recent Labs    02/02/18 0232 02/03/18 0610 02/04/18 0231 02/05/18 1033 02/06/18 0254  WBC 16.9* 17.9* 14.6*  --  12.7*  HGB 7.2* 7.7* 6.8* 9.1* 8.2*  HCT 24.4* 26.2* 22.9* 30.4* 26.0*  PLT 879* 940* 777*  --  782*    COAGS: Recent Labs    01/03/18 1929  01/17/18 0443 01/25/18 0348  INR 1.20 1.31 1.15  APTT  --  37*  --     BMP: Recent Labs    02/04/18 0231 02/05/18 0444 02/06/18 0254 02/07/18 0510  NA 132* 135 135 135  K 2.8* 3.6 3.1* 3.4*  CL 96* 97* 96* 97*  CO2 24 26 28 27   GLUCOSE 220* 101* 130* 60*  BUN 9 9 9 9   CALCIUM 7.4* 7.7* 7.5* 7.7*  CREATININE 0.71 0.75 0.64 0.64  GFRNONAA >60 >60 >60 >60  GFRAA >60 >60 >60 >60    LIVER FUNCTION TESTS: Recent Labs    01/05/18 0551 01/17/18 0237 01/30/18 0225 01/31/18 1041  BILITOT 0.3 0.7 0.3 0.8  AST 21 19 38 56*  ALT 19 15 31 31   ALKPHOS 141* 152* 249* 330*  PROT 7.1 7.2 5.7* 6.4*  ALBUMIN 2.6* 2.5* 1.4* 1.6*    Assessment and Plan:  Minimal OP from rt rectus sheath drain Significant OP from Lt thigh drain Will follow When OP has decreased to minimal for both-- less than 10 cc daily each  Electronically Signed: Lavonia Drafts, PA-C 02/07/2018, 1:21 PM   I spent a total of 15 Minutes at the the patient's bedside AND on the patient's hospital floor or unit, greater than 50% of which was counseling/coordinating care for  rt rectus sheath drain; lt thigh abscess drain

## 2018-02-07 NOTE — Progress Notes (Signed)
Peripherally Inserted Central Catheter/Midline Placement  The IV Nurse has discussed with the patient and/or persons authorized to consent for the patient, the purpose of this procedure and the potential benefits and risks involved with this procedure.  The benefits include less needle sticks, lab draws from the catheter, and the patient may be discharged home with the catheter. Risks include, but not limited to, infection, bleeding, blood clot (thrombus formation), and puncture of an artery; nerve damage and irregular heartbeat and possibility to perform a PICC exchange if needed/ordered by physician.  Alternatives to this procedure were also discussed.  Bard Power PICC patient education guide, fact sheet on infection prevention and patient information card has been provided to patient /or left at bedside.    PICC/Midline Placement Documentation  PICC Single Lumen 02/07/18 PICC Right Brachial 39 cm 0 cm (Active)  Indication for Insertion or Continuance of Line Home intravenous therapies (PICC only) 02/07/2018  5:23 PM  Exposed Catheter (cm) 0 cm 02/07/2018  5:23 PM  Site Assessment Dry;Clean;Intact 02/07/2018  5:23 PM  Line Status Flushed;Blood return noted 02/07/2018  5:23 PM  Dressing Type Transparent 02/07/2018  5:23 PM  Dressing Status Clean;Dry;Intact;Antimicrobial disc in place 02/07/2018  5:23 PM  Dressing Intervention New dressing 02/07/2018  5:23 PM  Dressing Change Due 02/14/18 02/07/2018  5:23 PM       Anthony Dunlap 02/07/2018, 5:24 PM

## 2018-02-07 NOTE — Progress Notes (Signed)
PROGRESS NOTE    Anthony Dunlap  XTG:[id number] DOB: 1934-01-17 DOA: 01/17/2018 PCP: [name], MD   Brief Narrative: Patient is 84 year old male with past medical history of hypothyroidism, insulin-dependent diabetes, hyperlipidemia, prostate cancer with chronic Foley catheter who presented with sepsis and DKA.  Blood culture grew Peptostreptococcus and Bacteroides. Wound culture grew Enterococcus faecalis and Klebsiella pneumonia.  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  IR consulted and he  is status post drain placement.  Patient was also seen by infectious disease who recommended 8 weeks of IV antibiotics.  DKA has resolved. Hemoglobin dropped  to 6.8. so he was transfused with one unit of PRBC on 02/04/18.  Currently waiting for clearance from IR for discharge to skilled nursing facility.  Assessment & Plan:   Principal Problem:   Enterococcus faecalis infection Active Problems:   Sepsis secondary to UTI ([place])   Hypothyroidism   DKA (diabetic ketoacidoses) ([name])   Normocytic anemia   Sepsis due to Bacteroides species ([name])   Sepsis without acute organ dysfunction ([name])   Psoas abscess ([name])   Abscess  Bacteremia/sepsis: Blood cultures grew bacteroids and Peptostreptococcus.  Wound cultures grew enterococcus and Klebsiella pneumonia.  CT abdomen/pelvis showed osteomyelitis of the pubic symphysis with abscess in the rectus sheath and left thigh.  Status post drains placement.  Rectus sheath abscess-S/P drain placement by Dr. Kathlene Cote 01/25/2018 Thigh abscess-S/P Aspiration and drain placement anterior thigh abscess 01/29/18 by Dr. Laurence Ferrari  IR following.  Plan is to continue IV Unasyn for total of 8 weeks.  Echocardiogram was unrevealing for endocarditis. Leukocytosis improved . We will put a PICC line today.  Last blood cultures have been negative. IR planning to repeat CT when thigh drain has  about 20 mL or less of output.  Thigh drain has significantly improved today.  DKA: Resolved.  Insulin drip was stopped.  Transition to Levemir and sliding scale insulin.  As of long-acting insulin decreased.  Hypothyroidism: Continue Synthyroid  Prostate cancer: Has chronic Foley catheter placed  History of reactive thrombocytosis: On aspirin 81 mg daily.    Hyperlipidemia: Continue statin  Lower extremity edema: Continue PO lasix for now.  Lower extremity edema still significant.  Hypokalemia/hypomagnesemia: Currently being supplemented.  Will check magnesium  Anemia: Hemoglobin dropped to 6.8.  Most likely associated with his chronic medical problems.  No hematochezia or melena.    Transfused with 1 unit of PRBC on 02/04/18. FOBT  sent and found to be negative.His stool was reported to be clear jelly like.         DVT prophylaxis: SCD Code Status: Full Family Communication: None present at the bedside Disposition Plan: Skilled nursing facility after clearance from IR( likely after removal of drain)   Consultants: IR, ID  Procedures: Placement of drains  Antimicrobials: Currently on Unasyn since 12/7  Subjective: Patient seen and examined the bedside this afternoon.  Remains hemodynamically stable.  Denies any specific complaints.  Feels better.  Thigh drain has improved with less volume of serosanguineous fluid.Still has lower extremity edema.  Objective: Vitals:   02/06/18 0553 02/06/18 1500 02/06/18 2234 02/07/18 0459  BP: (!) 143/88 (!) 140/92 138/88 (!) 142/83  Pulse: 97 (!) 102 (!) 102 (!) 109  Resp: 17 19 17 20   Temp: 98.8 F (37.1 C) 98.7 F (37.1 C) 98.8 F (37.1 C) 98.6 F (37 C)  TempSrc: Oral Oral Oral Oral  SpO2: 99% 100% 96% 97%  Weight:      Height:        Intake/Output Summary (Last 24 hours) at 02/07/2018 1302 Last data filed at 02/07/2018 0900 Gross per 24 hour  Intake 1675.25 ml  Output 2202 ml  Net -526.75 ml   Filed Weights    01/17/18 0209 01/17/18 0600 01/23/18 2337  Weight: 70.3 kg 67.1 kg 72.9 kg    Examination:  General exam: Appears calm and comfortable ,Not in distress,average built HEENT:PERRL,Oral mucosa moist, Ear/Nose normal on gross exam Respiratory system: Bilateral equal air entry, normal vesicular breath sounds, no wheezes or crackles  Cardiovascular system: S1 & S2 heard, RRR. No JVD, murmurs, rubs, gallops or clicks. No pedal edema. Gastrointestinal system: Abdomen is nondistended, soft and nontender. No organomegaly or masses felt. Normal bowel sounds heard.Abdominal drain with clear fluid Central nervous system: Alert and oriented. No focal neurological deficits. Extremities: Improving lower extremity edema, no clubbing ,no cyanosis, distal peripheral pulses palpable.Drain on the left upper thigh draining serosanguinous fluid Psychiatry: Judgement and insight appear normal. Mood & affect appropriate.     Data Reviewed: I have personally reviewed following labs and imaging studies  CBC: Recent Labs  Lab 02/01/18 0454 02/02/18 0232 02/03/18 0610 02/04/18 0231 02/05/18 1033 02/06/18 0254  WBC 17.6* 16.9* 17.9* 14.6*  --  12.7*  NEUTROABS  --   --   --   --   --  9.9*  HGB 7.3* 7.2* 7.7* 6.8* 9.1* 8.2*  HCT 24.7* 24.4* 26.2* 22.9* 30.4* 26.0*  MCV 79.4* 79.2* 79.6* 79.5*  --  80.2  PLT 946* 879* 940* 777*  --  706*   Basic Metabolic Panel: Recent Labs  Lab 02/03/18 0610 02/04/18 0231 02/05/18 0444 02/06/18 0254 02/07/18 0510  NA 133* 132* 135 135 135  K 3.4* 2.8* 3.6 3.1* 3.4*  CL 94* 96* 97* 96* 97*  CO2 26 24 26 28 27   GLUCOSE 181* 220* 101* 130* 60*  BUN 8 9 9 9 9   CREATININE 0.78 0.71 0.75 0.64 0.64  CALCIUM 7.5* 7.4* 7.7* 7.5* 7.7*  MG  --  1.6*  --  1.8  --    GFR: Estimated Creatinine Clearance: 68.9 mL/min (by C-G formula based on SCr of 0.64 mg/dL). Liver Function Tests: No results for input(s): AST, ALT, ALKPHOS, BILITOT, PROT, ALBUMIN in the last 168  hours. No results for input(s): LIPASE, AMYLASE in the last 168 hours. No results for input(s): AMMONIA in the last 168 hours. Coagulation Profile: No results for input(s): INR, PROTIME in the last 168 hours. Cardiac Enzymes: No results for input(s): CKTOTAL, CKMB, CKMBINDEX, TROPONINI in the last 168 hours. BNP (last 3 results) No results for input(s): PROBNP in the last 8760 hours. HbA1C: No results for input(s): HGBA1C in the last 72 hours. CBG: Recent Labs  Lab 02/06/18 1712 02/06/18 2231 02/07/18 0812 02/07/18 0850 02/07/18 1236  GLUCAP 223* 161* 59* 74 166*   Lipid Profile: No results for input(s): CHOL, HDL, LDLCALC, TRIG, CHOLHDL, LDLDIRECT in the last 72 hours. Thyroid Function Tests: No results for input(s): TSH, T4TOTAL, FREET4, T3FREE, THYROIDAB in the last 72 hours. Anemia Panel: No results for input(s): VITAMINB12, FOLATE, FERRITIN, TIBC, IRON, RETICCTPCT in the last 72 hours. Sepsis Labs: No results for input(s): PROCALCITON, LATICACIDVEN in the last 168 hours.  Recent Results (from the past 240 hour(s))  Aerobic/Anaerobic Culture (surgical/deep wound)     Status: None   Collection Time: 01/29/18  3:27 PM  Result Value Ref Range Status  Specimen Description ABSCESS LEFT THIGH  Final   Special Requests NONE  Final   Gram Stain   Final    ABUNDANT WBC PRESENT, PREDOMINANTLY PMN FEW GRAM POSITIVE COCCI RARE GRAM POSITIVE RODS    Culture   Final    FEW ENTEROCOCCUS FAECALIS RARE KLEBSIELLA PNEUMONIAE NO ANAEROBES ISOLATED Performed at [facility], [address] [place], [place] [address]    Report Status 02/03/2018 FINAL  Final   Organism ID, Bacteria ENTEROCOCCUS FAECALIS  Final   Organism ID, Bacteria KLEBSIELLA PNEUMONIAE  Final      Susceptibility   Enterococcus faecalis - MIC*    AMPICILLIN <=2 SENSITIVE Sensitive     VANCOMYCIN 1 SENSITIVE Sensitive     GENTAMICIN SYNERGY RESISTANT Resistant     * FEW ENTEROCOCCUS FAECALIS    Klebsiella pneumoniae - MIC*    AMPICILLIN >=32 RESISTANT Resistant     CEFAZOLIN <=4 SENSITIVE Sensitive     CEFEPIME <=1 SENSITIVE Sensitive     CEFTAZIDIME <=1 SENSITIVE Sensitive     CEFTRIAXONE <=1 SENSITIVE Sensitive     CIPROFLOXACIN <=0.25 SENSITIVE Sensitive     GENTAMICIN <=1 SENSITIVE Sensitive     IMIPENEM <=0.25 SENSITIVE Sensitive     TRIMETH/SULFA <=20 SENSITIVE Sensitive     AMPICILLIN/SULBACTAM 8 SENSITIVE Sensitive     PIP/TAZO <=4 SENSITIVE Sensitive     Extended ESBL NEGATIVE Sensitive     * RARE KLEBSIELLA PNEUMONIAE  Culture, blood (routine x 2)     Status: None   Collection Time: 02/01/18 12:50 PM  Result Value Ref Range Status   Specimen Description BLOOD RIGHT HAND  Final   Special Requests   Final    BOTTLES DRAWN AEROBIC ONLY Blood Culture adequate volume   Culture   Final    NO GROWTH 5 DAYS Performed at [facility], [place] [address], [place], [place] [address]    Report Status 02/06/2018 FINAL  Final  Culture, blood (routine x 2)     Status: None   Collection Time: 02/01/18 12:52 PM  Result Value Ref Range Status   Specimen Description BLOOD RIGHT WRIST  Final   Special Requests   Final    BOTTLES DRAWN AEROBIC ONLY Blood Culture adequate volume   Culture   Final    NO GROWTH 5 DAYS Performed at [facility], [place] [address], [place], [place] [address]    Report Status 02/06/2018 FINAL  Final         Radiology Studies: [place] Ekg Site Rite  Result Date: 02/07/2018 If Site Rite image not attached, placement could not be confirmed due to current cardiac rhythm.       Scheduled Meds: . sodium chloride   Intravenous Once  . aspirin  81 mg Oral Daily  . enoxaparin (LOVENOX) injection  40 mg Subcutaneous Q24H  . feeding supplement (GLUCERNA SHAKE)  237 mL Oral BID BM  . furosemide  40 mg Oral Daily  . insulin aspart  0-9 Units Subcutaneous TID WC  . insulin detemir  8 Units Subcutaneous QHS  . levothyroxine  25 mcg Oral Q0600   . magnesium oxide  400 mg Oral Daily  . povidone-iodine  2 application Topical Once  . pravastatin  10 mg Oral q1800  . sodium chloride flush  5 mL Intracatheter Q8H  . sodium chloride flush  5 mL Intracatheter Q8H   Continuous Infusions: . sodium chloride Stopped (01/31/18 1800)  . ampicillin-sulbactam (UNASYN) IV 3 g (  02/07/18 1124)     LOS: 21 days    Time spent: 35 mins.More than 50% of that time was spent in counseling and/or coordination of care.      Shelly Coss, MD [facility] Pager [phone]  If 7PM-7AM, please contact night-coverage [email or web address] Password [email or web address] 02/07/2018, 1:02 PM

## 2018-02-07 NOTE — Progress Notes (Signed)
Inpatient Diabetes Program Recommendations  AACE/ADA: New Consensus Statement on Inpatient Glycemic Control (2015)  Target Ranges:  Prepandial:   less than 140 mg/dL      Peak postprandial:   less than 180 mg/dL (1-2 hours)      Critically ill patients:  140 - 180 mg/dL   Lab Results  Component Value Date   GLUCAP 74 02/07/2018   HGBA1C 9.6 (H) 01/04/2018    Review of Glycemic Control Results for MARCIAL, PLESS (MRN [id number]) as of 02/07/2018 12:13  Ref. Range 02/06/2018 22:31 02/07/2018 08:12 02/07/2018 08:50  Glucose-Capillary Latest Ref Range: 70 - 99 mg/dL 161 (H) 59 (L) 74   Current orders for Inpatient glycemic control: Levemir 8 units QHS, Novolog 0-9 units TID  Inpatient Diabetes Program Recommendations:   Noted hypoglycemia of 59 mg/dL this AM following decrease to Levemir 16 on 12/19. Agree with recent further decrease of Levemir 8 units QHS. Will continue to follow.   Thanks, Bronson Curb, MSN, RNC-OB Diabetes Coordinator [phone] (8a-5p)

## 2018-02-07 NOTE — Plan of Care (Signed)
  Problem: Coping: Goal: Level of anxiety will decrease Outcome: Progressing   Problem: Elimination: Goal: Will not experience complications related to urinary retention Outcome: Progressing   Problem: Pain Managment: Goal: General experience of comfort will improve Outcome: Progressing   Problem: Skin Integrity: Goal: Risk for impaired skin integrity will decrease Outcome: Progressing

## 2018-02-07 NOTE — Progress Notes (Signed)
Physical Therapy Treatment Patient Details Name: HENCE DERRICK MRN: [id number] DOB: September 21, 1933 Today's Date: 02/07/2018    History of Present Illness 83 y.o. male with medical history significant for hypothyroidism, insulin-dependent diabetes mellitus, hyperlipidemia, and history of prostate cancer with chronic Foley catheter, now presenting to the emergency department for evaluation of rigors.  Patient reports that he was in his usual state of health until overnight when he began sweating and developed shaking chills.  In the ED, pt was febrile with HR in 140s.  He also had serum glucose of 440 with anion gap of 12.  Pt admitted with sepsis and CT revealed muliple intramuscular abscesses of the abdomen and pelvis.  Pt is s/p drainage placement on 12/7 and drainage placement on 12/11.     PT Comments    Pt making good progress towards his goals, requiring less assist to get to [facility] and able to sit>stand from recliner to RW with maxAx2 with marching in place. Pt standing tolerance is increasing to >1 min for pericare. Pt continues to be limited by incontinence of stool with standing and decreased strength, balance and endurance.  D/c plans continue to remain appropriate. PT will continue to follow acutely.   Follow Up Recommendations  SNF;Supervision/Assistance - 24 hour        Recommendations for Other Services OT consult     Precautions / Restrictions Precautions Precautions: Fall Precaution Comments: 2 jackson prat drains in place  Restrictions Weight Bearing Restrictions: No    Mobility  Bed Mobility Overal bed mobility: Needs Assistance Bed Mobility: Supine to Sit     Supine to sit: Min assist     General bed mobility comments: minA for managing LE to floor and for pad scoot of hips to EoB  Transfers Overall transfer level: Needs assistance   Transfers: Sit to/from Stand Sit to Stand: +2 physical assistance;Mod assist;Max assist         General transfer comment:  maxAx2 for initial sit>stand from elevated bed to [facility]. Upon standing pt became incontinent of clear, gel, stool. Able to stand >30 sec to finish. Pt transferred to chair, and on standing with modAx2 to get out of [place] became incontinent again. Pt able to stand up to RW with maxAx2 for cleaning.   Ambulation/Gait             General Gait Details: unable         Balance           Standing balance support: During functional activity;Bilateral upper extremity supported Standing balance-Leahy Scale: Poor                              Cognition Arousal/Alertness: Awake/alert Behavior During Therapy: WFL for tasks assessed/performed Overall Cognitive Status: Within Functional Limits for tasks assessed                                               Pertinent Vitals/Pain Pain Assessment: Faces Faces Pain Scale: Hurts a little bit Pain Location: bilateral knee with flexion  Pain Descriptors / Indicators: Grimacing           PT Goals (current goals can now be found in the care plan section) Acute Rehab PT Goals Patient Stated Goal: return home with family to assist PT Goal Formulation: With patient Time For  Goal Achievement: 02/11/18 Potential to Achieve Goals: Poor Progress towards PT goals: Progressing toward goals    Frequency    Min 3X/week      PT Plan Current plan remains appropriate       AM-PAC PT "6 Clicks" Mobility   Outcome Measure  Help needed turning from your back to your side while in a flat bed without using bedrails?: Total Help needed moving from lying on your back to sitting on the side of a flat bed without using bedrails?: Total Help needed moving to and from a bed to a chair (including a wheelchair)?: Total Help needed standing up from a chair using your arms (e.g., wheelchair or bedside chair)?: Total Help needed to walk in hospital room?: Total Help needed climbing 3-5 steps with a railing? : Total 6  Click Score: 6    End of Session Equipment Utilized During Treatment: Gait belt Activity Tolerance: Patient limited by fatigue;Patient tolerated treatment well Patient left: in chair;with call bell/phone within reach;with family/visitor present Nurse Communication: Mobility status PT Visit Diagnosis: Unsteadiness on feet (R26.81);Other abnormalities of gait and mobility (R26.89);Muscle weakness (generalized) (M62.81)     Time: 0867-6195 PT Time Calculation (min) (ACUTE ONLY): 28 min  Charges:  $Therapeutic Exercise: 23-37 mins                     Harkirat Orozco B. Migdalia Dk PT, DPT Acute Rehabilitation Services Pager [phone] Office [phone]    Rebecca 02/07/2018, 12:59 PM

## 2018-02-07 NOTE — Social Work (Signed)
CSW spoke with pt daughter [name], they are okay with [facility] when pt medically stable.  Westley Hummer, MSW, [place] Work [phone]

## 2018-02-08 LAB — CBC WITH DIFFERENTIAL/PLATELET
Abs Immature Granulocytes: 0.07 10*3/uL (ref 0.00–0.07)
Basophils Absolute: 0 10*3/uL (ref 0.0–0.1)
Basophils Relative: 0 %
EOS ABS: 0.2 10*3/uL (ref 0.0–0.5)
Eosinophils Relative: 3 %
HCT: 25.3 % — ABNORMAL LOW (ref 39.0–52.0)
Hemoglobin: 7.5 g/dL — ABNORMAL LOW (ref 13.0–17.0)
IMMATURE GRANULOCYTES: 1 %
Lymphocytes Relative: 12 %
Lymphs Abs: 1.2 10*3/uL (ref 0.7–4.0)
MCH: 24.3 pg — ABNORMAL LOW (ref 26.0–34.0)
MCHC: 29.6 g/dL — ABNORMAL LOW (ref 30.0–36.0)
MCV: 81.9 fL (ref 80.0–100.0)
Monocytes Absolute: 0.7 10*3/uL (ref 0.1–1.0)
Monocytes Relative: 8 %
Neutro Abs: 7.5 10*3/uL (ref 1.7–7.7)
Neutrophils Relative %: 76 %
Platelets: 698 10*3/uL — ABNORMAL HIGH (ref 150–400)
RBC: 3.09 MIL/uL — ABNORMAL LOW (ref 4.22–5.81)
RDW: 15.3 % (ref 11.5–15.5)
WBC: 9.7 10*3/uL (ref 4.0–10.5)
nRBC: 0 % (ref 0.0–0.2)

## 2018-02-08 LAB — BASIC METABOLIC PANEL
ANION GAP: 10 (ref 5–15)
BUN: 10 mg/dL (ref 8–23)
CO2: 28 mmol/L (ref 22–32)
Calcium: 7.5 mg/dL — ABNORMAL LOW (ref 8.9–10.3)
Chloride: 96 mmol/L — ABNORMAL LOW (ref 98–111)
Creatinine, Ser: 0.7 mg/dL (ref 0.61–1.24)
GFR calc Af Amer: >60 mL/min (ref >60)
GFR calc non Af Amer: >60 mL/min (ref >60)
Glucose, Bld: 118 mg/dL — ABNORMAL HIGH (ref 70–99)
Potassium: 3.2 mmol/L — ABNORMAL LOW (ref 3.5–5.1)
Sodium: 134 mmol/L — ABNORMAL LOW (ref 135–145)

## 2018-02-08 LAB — GLUCOSE, CAPILLARY
GLUCOSE-CAPILLARY: 174 mg/dL — AB (ref 70–99)
Glucose-Capillary: 62 mg/dL — ABNORMAL LOW (ref 70–99)
Glucose-Capillary: 81 mg/dL (ref 70–99)
Glucose-Capillary: 191 mg/dL — ABNORMAL HIGH (ref 70–99)
Glucose-Capillary: 239 mg/dL — ABNORMAL HIGH (ref 70–99)

## 2018-02-08 MED ORDER — POTASSIUM CHLORIDE CRYS ER 20 MEQ PO TBCR
40.0000 meq | EXTENDED_RELEASE_TABLET | Freq: Every day | ORAL | Status: DC
  Administered 2018-02-08 – 2018-02-18 (×10): 40 meq via ORAL
  Filled 2018-02-08 (×12): qty 2

## 2018-02-08 NOTE — Plan of Care (Signed)
  Problem: Activity: Goal: Risk for activity intolerance will decrease Outcome: Progressing   Problem: Coping: Goal: Level of anxiety will decrease Outcome: Progressing   Problem: Elimination: Goal: Will not experience complications related to bowel motility Outcome: Progressing   

## 2018-02-08 NOTE — Progress Notes (Signed)
PROGRESS NOTE    Anthony Dunlap  BPZ:[id number] DOB: September 13, 1933 DOA: 01/17/2018 PCP: [name], MD   Brief Narrative: Patient is 84 year old male with past medical history of hypothyroidism, insulin-dependent diabetes, hyperlipidemia, prostate cancer with chronic Foley catheter who presented with sepsis and DKA.  Blood culture grew Peptostreptococcus and Bacteroides. Wound culture grew Enterococcus faecalis and Klebsiella pneumonia.  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  IR consulted and he  is status post drain placement.  Patient was also seen by infectious disease who recommended 8 weeks of IV antibiotics.  DKA has resolved. Hemoglobin dropped  to 6.8. so he was transfused with one unit of PRBC on 02/04/18.  Currently waiting for clearance from IR for discharge to skilled nursing facility.  Assessment & Plan:   Principal Problem:   Enterococcus faecalis infection Active Problems:   Sepsis secondary to UTI ([place])   Hypothyroidism   DKA (diabetic ketoacidoses) ([name])   Normocytic anemia   Sepsis due to Bacteroides species ([name])   Sepsis without acute organ dysfunction ([name])   Psoas abscess ([name])   Abscess  Bacteremia/sepsis: Blood cultures grew bacteroids and Peptostreptococcus.  Wound cultures grew enterococcus and Klebsiella pneumonia.  CT abdomen/pelvis showed osteomyelitis of the pubic symphysis with abscess in the rectus sheath and left thigh.  Status post drains placement.  Rectus sheath abscess-S/P drain placement by Dr. Kathlene Cote 01/25/2018 Thigh abscess-S/P Aspiration and drain placement anterior thigh abscess 01/29/18 by Dr. Laurence Ferrari  IR following.  Plan is to continue IV Unasyn for total of 8 weeks.  Echocardiogram was unrevealing for endocarditis. Leukocytosis improved . PICC line placed.  Last blood cultures have been negative. IR planning to repeat CT when thigh drain has about 20 mL or  less of output.  Thigh drain has significant output.  DKA: Resolved.  Insulin drip was stopped.  Transition to Levemir and sliding scale insulin.  As of long-acting insulin decreased.  Hypothyroidism: Continue Synthyroid  Prostate cancer: Has chronic Foley catheter placed  History of reactive thrombocytosis: On aspirin 81 mg daily.    Hyperlipidemia: Continue statin  Lower extremity edema: Continue PO lasix for now.  Lower extremity edema still significant.  His echocardiogram has shown ejection fraction of 66 5%, grade 1 diastolic dysfunction.  Hypokalemia/hypomagnesemia: Currently being supplemented.   Anemia: Hemoglobin today 7.4  Most likely associated with his chronic medical problems.  No hematochezia or melena.    Transfused with 1 unit of PRBC on 02/04/18. FOBT  sent and found to be negative.His stool was reported to be clear jelly like.         DVT prophylaxis: SCD Code Status: Full Family Communication: None present at the bedside Disposition Plan: Skilled nursing facility after clearance from IR( likely after removal of drain)   Consultants: IR, ID  Procedures: Placement of drains  Antimicrobials: Currently on Unasyn since 12/7  Subjective: Patient seen and examined the bedside this afternoon.  Remains hemodynamically stable.  Denies any specific complaints.  Feels better.  Thigh drain today has output of significant volume of  serosanguineous fluid.Still has lower extremity edema.  Objective: Vitals:   02/07/18 0459 02/07/18 1354 02/07/18 2228 02/08/18 0519  BP: (!) 142/83 117/79 135/83 138/86  Pulse: (!) 109 (!) 110 (!) 102 99  Resp: 20 18 16 15   Temp: 98.6 F (37 C) 98.1 F (36.7 C) 98.5 F (36.9 C) 98.2 F (36.8 C)  TempSrc: Oral Oral Oral Oral  SpO2: 97% 99% 96% 95%  Weight:      Height:        Intake/Output Summary (Last 24 hours) at 02/08/2018 1448 Last data filed at 02/08/2018 1424 Gross per 24 hour  Intake 830 ml  Output 3265 ml  Net  -2435 ml   Filed Weights   01/17/18 0209 01/17/18 0600 01/23/18 2337  Weight: 70.3 kg 67.1 kg 72.9 kg    Examination:  General exam: Appears calm and comfortable ,Not in distress,average built HEENT:PERRL,Oral mucosa moist, Ear/Nose normal on gross exam Respiratory system: Bilateral equal air entry, normal vesicular breath sounds, no wheezes or crackles  Cardiovascular system: S1 & S2 heard, RRR. No JVD, murmurs, rubs, gallops or clicks. No pedal edema. Gastrointestinal system: Abdomen is nondistended, soft and nontender. No organomegaly or masses felt. Normal bowel sounds heard.Abdominal drain with clear fluid Central nervous system: Alert and oriented. No focal neurological deficits. Extremities: lower extremity edema, no clubbing ,no cyanosis, distal peripheral pulses palpable.Drain on the left upper thigh draining serosanguinous fluid Psychiatry: Judgement and insight appear normal. Mood & affect appropriate.     Data Reviewed: I have personally reviewed following labs and imaging studies  CBC: Recent Labs  Lab 02/02/18 0232 02/03/18 0610 02/04/18 0231 02/05/18 1033 02/06/18 0254 02/08/18 0349  WBC 16.9* 17.9* 14.6*  --  12.7* 9.7  NEUTROABS  --   --   --   --  9.9* 7.5  HGB 7.2* 7.7* 6.8* 9.1* 8.2* 7.5*  HCT 24.4* 26.2* 22.9* 30.4* 26.0* 25.3*  MCV 79.2* 79.6* 79.5*  --  80.2 81.9  PLT 879* 940* 777*  --  782* 269*   Basic Metabolic Panel: Recent Labs  Lab 02/04/18 0231 02/05/18 0444 02/06/18 0254 02/07/18 0510 02/08/18 0349  NA 132* 135 135 135 134*  K 2.8* 3.6 3.1* 3.4* 3.2*  CL 96* 97* 96* 97* 96*  CO2 24 26 28 27 28   GLUCOSE 220* 101* 130* 60* 118*  BUN 9 9 9 9 10   CREATININE 0.71 0.75 0.64 0.64 0.70  CALCIUM 7.4* 7.7* 7.5* 7.7* 7.5*  MG 1.6*  --  1.8  --   --    GFR: Estimated Creatinine Clearance: 68.9 mL/min (by C-G formula based on SCr of 0.7 mg/dL). Liver Function Tests: No results for input(s): AST, ALT, ALKPHOS, BILITOT, PROT, ALBUMIN in the  last 168 hours. No results for input(s): LIPASE, AMYLASE in the last 168 hours. No results for input(s): AMMONIA in the last 168 hours. Coagulation Profile: No results for input(s): INR, PROTIME in the last 168 hours. Cardiac Enzymes: No results for input(s): CKTOTAL, CKMB, CKMBINDEX, TROPONINI in the last 168 hours. BNP (last 3 results) No results for input(s): PROBNP in the last 8760 hours. HbA1C: No results for input(s): HGBA1C in the last 72 hours. CBG: Recent Labs  Lab 02/07/18 1649 02/07/18 2055 02/08/18 0757 02/08/18 0814 02/08/18 1201  GLUCAP 261* 198* 62* 81 174*   Lipid Profile: No results for input(s): CHOL, HDL, LDLCALC, TRIG, CHOLHDL, LDLDIRECT in the last 72 hours. Thyroid Function Tests: No results for input(s): TSH, T4TOTAL, FREET4, T3FREE, THYROIDAB in the last 72 hours. Anemia Panel: No results for input(s): VITAMINB12, FOLATE, FERRITIN, TIBC, IRON, RETICCTPCT in the last 72 hours. Sepsis Labs: No results for input(s): PROCALCITON, LATICACIDVEN in the last 168 hours.  Recent Results (from the past 240 hour(s))  Aerobic/Anaerobic Culture (surgical/deep wound)     Status: None   Collection Time: 01/29/18  3:27 PM  Result Value Ref Range  Status   Specimen Description ABSCESS LEFT THIGH  Final   Special Requests NONE  Final   Gram Stain   Final    ABUNDANT WBC PRESENT, PREDOMINANTLY PMN FEW GRAM POSITIVE COCCI RARE GRAM POSITIVE RODS    Culture   Final    FEW ENTEROCOCCUS FAECALIS RARE KLEBSIELLA PNEUMONIAE NO ANAEROBES ISOLATED Performed at [facility], [address], [place], [place] [address]    Report Status 02/03/2018 FINAL  Final   Organism ID, Bacteria ENTEROCOCCUS FAECALIS  Final   Organism ID, Bacteria KLEBSIELLA PNEUMONIAE  Final      Susceptibility   Enterococcus faecalis - MIC*    AMPICILLIN <=2 SENSITIVE Sensitive     VANCOMYCIN 1 SENSITIVE Sensitive     GENTAMICIN SYNERGY RESISTANT Resistant     * FEW ENTEROCOCCUS FAECALIS    Klebsiella pneumoniae - MIC*    AMPICILLIN >=32 RESISTANT Resistant     CEFAZOLIN <=4 SENSITIVE Sensitive     CEFEPIME <=1 SENSITIVE Sensitive     CEFTAZIDIME <=1 SENSITIVE Sensitive     CEFTRIAXONE <=1 SENSITIVE Sensitive     CIPROFLOXACIN <=0.25 SENSITIVE Sensitive     GENTAMICIN <=1 SENSITIVE Sensitive     IMIPENEM <=0.25 SENSITIVE Sensitive     TRIMETH/SULFA <=20 SENSITIVE Sensitive     AMPICILLIN/SULBACTAM 8 SENSITIVE Sensitive     PIP/TAZO <=4 SENSITIVE Sensitive     Extended ESBL NEGATIVE Sensitive     * RARE KLEBSIELLA PNEUMONIAE  Culture, blood (routine x 2)     Status: None   Collection Time: 02/01/18 12:50 PM  Result Value Ref Range Status   Specimen Description BLOOD RIGHT HAND  Final   Special Requests   Final    BOTTLES DRAWN AEROBIC ONLY Blood Culture adequate volume   Culture   Final    NO GROWTH 5 DAYS Performed at [facility], [place] [address] [place], [place] [address]    Report Status 02/06/2018 FINAL  Final  Culture, blood (routine x 2)     Status: None   Collection Time: 02/01/18 12:52 PM  Result Value Ref Range Status   Specimen Description BLOOD RIGHT WRIST  Final   Special Requests   Final    BOTTLES DRAWN AEROBIC ONLY Blood Culture adequate volume   Culture   Final    NO GROWTH 5 DAYS Performed at [facility], [place] [address], [place],  [address]    Report Status 02/06/2018 FINAL  Final         Radiology Studies: [place] Ekg Site Rite  Result Date: 02/07/2018 If Site Rite image not attached, placement could not be confirmed due to current cardiac rhythm.       Scheduled Meds: . sodium chloride   Intravenous Once  . aspirin  81 mg Oral Daily  . docusate sodium  100 mg Oral BID  . enoxaparin (LOVENOX) injection  40 mg Subcutaneous Q24H  . feeding supplement (GLUCERNA SHAKE)  237 mL Oral BID BM  . furosemide  40 mg Oral Daily  . insulin aspart  0-9 Units Subcutaneous TID WC  . insulin detemir  8 Units Subcutaneous QHS    . levothyroxine  25 mcg Oral Q0600  . magnesium oxide  400 mg Oral Daily  . potassium chloride  40 mEq Oral Daily  . povidone-iodine  2 application Topical Once  . pravastatin  10 mg Oral q1800  . sodium chloride flush  10-40 mL Intracatheter Q12H  . sodium chloride flush  5 mL Intracatheter Q8H  . sodium chloride flush  5 mL Intracatheter Q8H   Continuous Infusions: . sodium chloride 250 mL (02/08/18 0852)  . ampicillin-sulbactam (UNASYN) IV 3 g (02/08/18 1030)     LOS: 22 days    Time spent: 35 mins.More than 50% of that time was spent in counseling and/or coordination of care.      Shelly Coss, MD [facility] Pager [phone]  If 7PM-7AM, please contact night-coverage [email or web address] Password [facility] 02/08/2018, 2:48 PM

## 2018-02-09 LAB — BASIC METABOLIC PANEL
Anion gap: 10 (ref 5–15)
BUN: 10 mg/dL (ref 8–23)
CALCIUM: 7.5 mg/dL — AB (ref 8.9–10.3)
CO2: 27 mmol/L (ref 22–32)
Chloride: 96 mmol/L — ABNORMAL LOW (ref 98–111)
Creatinine, Ser: 0.74 mg/dL (ref 0.61–1.24)
GFR calc Af Amer: >60 mL/min (ref >60)
GFR calc non Af Amer: >60 mL/min (ref >60)
Glucose, Bld: 129 mg/dL — ABNORMAL HIGH (ref 70–99)
Potassium: 3.1 mmol/L — ABNORMAL LOW (ref 3.5–5.1)
SODIUM: 133 mmol/L — AB (ref 135–145)

## 2018-02-09 LAB — CBC WITH DIFFERENTIAL/PLATELET
Abs Immature Granulocytes: 0.05 10*3/uL (ref 0.00–0.07)
Basophils Absolute: 0 10*3/uL (ref 0.0–0.1)
Basophils Relative: 0 %
Eosinophils Absolute: 0.2 10*3/uL (ref 0.0–0.5)
Eosinophils Relative: 3 %
HCT: 25.2 % — ABNORMAL LOW (ref 39.0–52.0)
Hemoglobin: 7.5 g/dL — ABNORMAL LOW (ref 13.0–17.0)
Immature Granulocytes: 1 %
Lymphocytes Relative: 12 %
Lymphs Abs: 1.1 10*3/uL (ref 0.7–4.0)
MCH: 24.3 pg — ABNORMAL LOW (ref 26.0–34.0)
MCHC: 29.8 g/dL — AB (ref 30.0–36.0)
MCV: 81.6 fL (ref 80.0–100.0)
Monocytes Absolute: 0.6 10*3/uL (ref 0.1–1.0)
Monocytes Relative: 7 %
Neutro Abs: 7.1 10*3/uL (ref 1.7–7.7)
Neutrophils Relative %: 77 %
Platelets: 649 10*3/uL — ABNORMAL HIGH (ref 150–400)
RBC: 3.09 MIL/uL — ABNORMAL LOW (ref 4.22–5.81)
RDW: 15.2 % (ref 11.5–15.5)
WBC: 9.2 10*3/uL (ref 4.0–10.5)
nRBC: 0 % (ref 0.0–0.2)

## 2018-02-09 LAB — GLUCOSE, CAPILLARY
Glucose-Capillary: 79 mg/dL (ref 70–99)
Glucose-Capillary: 188 mg/dL — ABNORMAL HIGH (ref 70–99)
Glucose-Capillary: 146 mg/dL — ABNORMAL HIGH (ref 70–99)
Glucose-Capillary: 326 mg/dL — ABNORMAL HIGH (ref 70–99)

## 2018-02-09 LAB — GASTROINTESTINAL PANEL BY PCR, STOOL (REPLACES STOOL CULTURE)

## 2018-02-09 LAB — MAGNESIUM: Magnesium: 1.8 mg/dL (ref 1.7–2.4)

## 2018-02-09 MED ORDER — INSULIN ASPART 100 UNIT/ML ~~LOC~~ SOLN
5.0000 [IU] | Freq: Once | SUBCUTANEOUS | Status: AC
  Administered 2018-02-09: 5 [IU] via SUBCUTANEOUS

## 2018-02-09 MED ORDER — FUROSEMIDE 20 MG PO TABS
20.0000 mg | ORAL_TABLET | Freq: Every day | ORAL | Status: DC
  Administered 2018-02-10 – 2018-02-13 (×4): 20 mg via ORAL
  Filled 2018-02-09 (×4): qty 1

## 2018-02-09 MED ORDER — POTASSIUM CHLORIDE CRYS ER 20 MEQ PO TBCR
20.0000 meq | EXTENDED_RELEASE_TABLET | Freq: Once | ORAL | Status: AC
  Administered 2018-02-09: 20 meq via ORAL
  Filled 2018-02-09: qty 1

## 2018-02-09 NOTE — Progress Notes (Addendum)
PROGRESS NOTE    Anthony Dunlap  XHB:[id number] DOB: 1933-12-19 DOA: 01/17/2018 PCP: [name], MD   Brief Narrative: Patient is 84 year old male with past medical history of hypothyroidism, insulin-dependent diabetes, hyperlipidemia, prostate cancer with chronic Foley catheter who presented with sepsis and DKA.  Blood culture grew Peptostreptococcus and Bacteroides. Wound culture grew Enterococcus faecalis and Klebsiella pneumonia.  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  IR consulted and he  is status post drain placement.  Patient was also seen by infectious disease who recommended 8 weeks of IV antibiotics.  DKA has resolved. Hemoglobin dropped  to 6.8. so he was transfused with one unit of PRBC on 02/04/18.  Currently waiting for clearance from IR for discharge to skilled nursing facility.  Assessment & Plan:   Principal Problem:   Enterococcus faecalis infection Active Problems:   Sepsis secondary to UTI ([place])   Hypothyroidism   DKA (diabetic ketoacidoses) ([name])   Normocytic anemia   Sepsis due to Bacteroides species ([name])   Sepsis without acute organ dysfunction ([name])   Psoas abscess ([name])   Abscess  Bacteremia/sepsis: Blood cultures grew bacteroids and Peptostreptococcus.  Wound cultures grew enterococcus and Klebsiella pneumonia.  CT abdomen/pelvis showed osteomyelitis of the pubic symphysis with abscess in the rectus sheath and left thigh.  Status post drains placement.  Rectus sheath abscess-S/P drain placement by Dr. Kathlene Cote 01/25/2018 Thigh abscess-S/P Aspiration and drain placement anterior thigh abscess 01/29/18 by Dr. Laurence Ferrari  IR following.  Plan is to continue IV Unasyn for total of 8 weeks.  Echocardiogram was unrevealing for endocarditis. Leukocytosis improved . PICC line placed.  Last blood cultures have been negative. IR planning to repeat CT when thigh drain has about 20 mL or  less of output.  Thigh drain still has significant output.  DKA: Resolved.  Insulin drip was stopped.  Transition to Levemir and sliding scale insulin.  Dose of long-acting insulin decreased.  Hypothyroidism: Continue Synthyroid  Prostate cancer: Has chronic Foley catheter placed  History of reactive thrombocytosis: On aspirin 81 mg daily.    Hyperlipidemia: Continue statin  Lower extremity edema: Continue PO lasix for now. Will decrease it to 20 mg daily. Lower extremity edema still significant.  His echocardiogram has shown ejection fraction of 66 5%, grade 1 diastolic dysfunction.  Hypokalemia/hypomagnesemia: Currently being supplemented.   Anemia: Hemoglobin today 7.5.  Most likely associated with his chronic medical problems.  No hematochezia or melena.    Transfused with 1 unit of PRBC on 02/04/18. FOBT  sent and found to be negative.  Jelly like stool: Unknown etiology.  He is eating fine.  His stool was noticed to be clear jelly like.  Will check GI pathogen panel.  Will discuss with GI if necessary after reviewing GI pathogen panel.  He does not complain of any abdominal pain.  Denies any diarrhea.  We will closely monitor his bowel movements and check stool every time when he defecates.  I have discussed this with RN.  Deconditioning/debility: Physical therapy evaluated him and recommends SNF on  Discharge.         DVT prophylaxis: SCD Code Status: Full Family Communication: None present at the bedside Disposition Plan: Skilled nursing facility after clearance from IR( likely after removal of drain)   Consultants: IR, ID  Procedures: Placement of drains  Antimicrobials: Currently on Unasyn since 12/7  Subjective: Patient seen and examined the bedside this afternoon.  Remains hemodynamically  stable.  Denies any specific complaints.  Feels better.  Thigh drain today still has  output of significant volume of  serosanguineous fluid.Still has lower extremity  edema.  Objective: Vitals:   02/07/18 2228 02/08/18 0519 02/08/18 2101 02/09/18 0521  BP: 135/83 138/86 124/77 (!) 144/81  Pulse: (!) 102 99 (!) 113 (!) 112  Resp: 16 15 16 16   Temp: 98.5 F (36.9 C) 98.2 F (36.8 C) 98.6 F (37 C) 98.9 F (37.2 C)  TempSrc: Oral Oral Oral Oral  SpO2: 96% 95% 95% 96%  Weight:      Height:        Intake/Output Summary (Last 24 hours) at 02/09/2018 1107 Last data filed at 02/09/2018 1019 Gross per 24 hour  Intake 539.61 ml  Output 2290 ml  Net -1750.39 ml   Filed Weights   01/17/18 0209 01/17/18 0600 01/23/18 2337  Weight: 70.3 kg 67.1 kg 72.9 kg    Examination:  General exam: Appears calm and comfortable ,Not in distress,average built,weak HEENT:PERRL,Oral mucosa moist, Ear/Nose normal on gross exam Respiratory system: Bilateral equal air entry, normal vesicular breath sounds, no wheezes or crackles  Cardiovascular system: S1 & S2 heard, RRR. No JVD, murmurs, rubs, gallops or clicks. No pedal edema. Gastrointestinal system: Abdomen is nondistended, soft and nontender. No organomegaly or masses felt. Normal bowel sounds heard.Abdominal drain with clear fluid Central nervous system: Alert and oriented. No focal neurological deficits. Extremities: lower extremity edema, no clubbing ,no cyanosis, distal peripheral pulses palpable.Drain on the left upper thigh draining serosanguinous fluid Psychiatry: Judgement and insight appear normal. Mood & affect appropriate.     Data Reviewed: I have personally reviewed following labs and imaging studies  CBC: Recent Labs  Lab 02/03/18 0610 02/04/18 0231 02/05/18 1033 02/06/18 0254 02/08/18 0349 02/09/18 0457  WBC 17.9* 14.6*  --  12.7* 9.7 9.2  NEUTROABS  --   --   --  9.9* 7.5 7.1  HGB 7.7* 6.8* 9.1* 8.2* 7.5* 7.5*  HCT 26.2* 22.9* 30.4* 26.0* 25.3* 25.2*  MCV 79.6* 79.5*  --  80.2 81.9 81.6  PLT 940* 777*  --  782* 698* 945*   Basic Metabolic Panel: Recent Labs  Lab 02/04/18 0231  02/05/18 0444 02/06/18 0254 02/07/18 0510 02/08/18 0349 02/09/18 0457  NA 132* 135 135 135 134* 133*  K 2.8* 3.6 3.1* 3.4* 3.2* 3.1*  CL 96* 97* 96* 97* 96* 96*  CO2 24 26 28 27 28 27   GLUCOSE 220* 101* 130* 60* 118* 129*  BUN 9 9 9 9 10 10   CREATININE 0.71 0.75 0.64 0.64 0.70 0.74  CALCIUM 7.4* 7.7* 7.5* 7.7* 7.5* 7.5*  MG 1.6*  --  1.8  --   --  1.8   GFR: Estimated Creatinine Clearance: 68.9 mL/min (by C-G formula based on SCr of 0.74 mg/dL). Liver Function Tests: No results for input(s): AST, ALT, ALKPHOS, BILITOT, PROT, ALBUMIN in the last 168 hours. No results for input(s): LIPASE, AMYLASE in the last 168 hours. No results for input(s): AMMONIA in the last 168 hours. Coagulation Profile: No results for input(s): INR, PROTIME in the last 168 hours. Cardiac Enzymes: No results for input(s): CKTOTAL, CKMB, CKMBINDEX, TROPONINI in the last 168 hours. BNP (last 3 results) No results for input(s): PROBNP in the last 8760 hours. HbA1C: No results for input(s): HGBA1C in the last 72 hours. CBG: Recent Labs  Lab 02/08/18 0814 02/08/18 1201 02/08/18 1657 02/08/18 2058 02/09/18 0838  GLUCAP 81 174* 191* 239* 79  Lipid Profile: No results for input(s): CHOL, HDL, LDLCALC, TRIG, CHOLHDL, LDLDIRECT in the last 72 hours. Thyroid Function Tests: No results for input(s): TSH, T4TOTAL, FREET4, T3FREE, THYROIDAB in the last 72 hours. Anemia Panel: No results for input(s): VITAMINB12, FOLATE, FERRITIN, TIBC, IRON, RETICCTPCT in the last 72 hours. Sepsis Labs: No results for input(s): PROCALCITON, LATICACIDVEN in the last 168 hours.  Recent Results (from the past 240 hour(s))  Culture, blood (routine x 2)     Status: None   Collection Time: 02/01/18 12:50 PM  Result Value Ref Range Status   Specimen Description BLOOD RIGHT HAND  Final   Special Requests   Final    BOTTLES DRAWN AEROBIC ONLY Blood Culture adequate volume   Culture   Final    NO GROWTH 5 DAYS Performed at  [facility], [address], [place], [place] [address]    Report Status 02/06/2018 FINAL  Final  Culture, blood (routine x 2)     Status: None   Collection Time: 02/01/18 12:52 PM  Result Value Ref Range Status   Specimen Description BLOOD RIGHT WRIST  Final   Special Requests   Final    BOTTLES DRAWN AEROBIC ONLY Blood Culture adequate volume   Culture   Final    NO GROWTH 5 DAYS Performed at [facility], [place] [address] [place], [place] [address]    Report Status 02/06/2018 FINAL  Final         Radiology Studies: No results found.      Scheduled Meds: . sodium chloride   Intravenous Once  . aspirin  81 mg Oral Daily  . docusate sodium  100 mg Oral BID  . enoxaparin (LOVENOX) injection  40 mg Subcutaneous Q24H  . feeding supplement (GLUCERNA SHAKE)  237 mL Oral BID BM  . furosemide  40 mg Oral Daily  . insulin aspart  0-9 Units Subcutaneous TID WC  . insulin detemir  8 Units Subcutaneous QHS  . levothyroxine  25 mcg Oral Q0600  . magnesium oxide  400 mg Oral Daily  . potassium chloride  40 mEq Oral Daily  . povidone-iodine  2 application Topical Once  . pravastatin  10 mg Oral q1800  . sodium chloride flush  10-40 mL Intracatheter Q12H  . sodium chloride flush  5 mL Intracatheter Q8H  . sodium chloride flush  5 mL Intracatheter Q8H   Continuous Infusions: . sodium chloride 250 mL (02/08/18 0852)  . ampicillin-sulbactam (UNASYN) IV 3 g (02/09/18 1001)     LOS: 23 days    Time spent: 35 mins.More than 50% of that time was spent in counseling and/or coordination of care.      Shelly Coss, MD [facility] Pager [phone]  If 7PM-7AM, please contact night-coverage [email or web address] Password [facility] 02/09/2018, 11:07 AM

## 2018-02-09 NOTE — Plan of Care (Signed)
  Problem: Coping: Goal: Level of anxiety will decrease Outcome: Progressing   Problem: Pain Managment: Goal: General experience of comfort will improve Outcome: Progressing   Problem: Safety: Goal: Ability to remain free from injury will improve Outcome: Progressing   

## 2018-02-10 DIAGNOSIS — L8990 Pressure ulcer of unspecified site, unspecified stage: Secondary | ICD-10-CM

## 2018-02-10 LAB — GLUCOSE, CAPILLARY
GLUCOSE-CAPILLARY: 437 mg/dL — AB (ref 70–99)
GLUCOSE-CAPILLARY: 220 mg/dL — AB (ref 70–99)
Glucose-Capillary: 295 mg/dL — ABNORMAL HIGH (ref 70–99)
Glucose-Capillary: 153 mg/dL — ABNORMAL HIGH (ref 70–99)
Glucose-Capillary: 289 mg/dL — ABNORMAL HIGH (ref 70–99)
Glucose-Capillary: 239 mg/dL — ABNORMAL HIGH (ref 70–99)

## 2018-02-10 LAB — BASIC METABOLIC PANEL
Anion gap: 12 (ref 5–15)
BUN: 14 mg/dL (ref 8–23)
CHLORIDE: 94 mmol/L — AB (ref 98–111)
CO2: 28 mmol/L (ref 22–32)
Calcium: 7.5 mg/dL — ABNORMAL LOW (ref 8.9–10.3)
Creatinine, Ser: 1.02 mg/dL (ref 0.61–1.24)
GFR calc Af Amer: >60 mL/min (ref >60)
GFR calc non Af Amer: >60 mL/min (ref >60)
Glucose, Bld: 166 mg/dL — ABNORMAL HIGH (ref 70–99)
Potassium: 2.8 mmol/L — ABNORMAL LOW (ref 3.5–5.1)
Sodium: 134 mmol/L — ABNORMAL LOW (ref 135–145)

## 2018-02-10 LAB — C DIFFICILE QUICK SCREEN W PCR REFLEX
C DIFFICILE (CDIFF) INTERP: NOT DETECTED
C Diff antigen: NEGATIVE
C Diff toxin: NEGATIVE

## 2018-02-10 MED ORDER — ENOXAPARIN SODIUM 30 MG/0.3ML ~~LOC~~ SOLN: 30.0000 mg | SUBCUTANEOUS | Status: DC

## 2018-02-10 MED ORDER — POTASSIUM CHLORIDE 10 MEQ/100ML IV SOLN
10.0000 meq | INTRAVENOUS | Status: AC
  Administered 2018-02-10 (×6): 10 meq via INTRAVENOUS
  Filled 2018-02-10 (×6): qty 100

## 2018-02-10 MED ORDER — PSYLLIUM 95 % PO PACK
1.0000 | PACK | Freq: Every day | ORAL | Status: DC
  Administered 2018-02-10 – 2018-02-15 (×4): 1 via ORAL
  Filled 2018-02-10 (×9): qty 1

## 2018-02-10 MED ORDER — ENOXAPARIN SODIUM 40 MG/0.4ML ~~LOC~~ SOLN
40.0000 mg | SUBCUTANEOUS | Status: DC
  Administered 2018-02-10 – 2018-02-12 (×3): 40 mg via SUBCUTANEOUS
  Filled 2018-02-10 (×3): qty 0.4

## 2018-02-10 MED ORDER — RISAQUAD PO CAPS
1.0000 | ORAL_CAPSULE | Freq: Every day | ORAL | Status: DC
  Administered 2018-02-10 – 2018-02-18 (×8): 1 via ORAL
  Filled 2018-02-10 (×8): qty 1

## 2018-02-10 NOTE — Progress Notes (Signed)
CSW spoke with [facility] who is still able to accept patient. Doctor reports patient continues to have medical workup, will not be ready to dc today.   Please advise when patient medically stable, as insurance authorization will need to be re started for patient to discharge to [facility].   CSW will continue to follow.   [place]

## 2018-02-10 NOTE — Consult Note (Signed)
[facility] Consult: 9:59 AM 02/10/2018  LOS: 24 days    Referring Provider: Dr Tawanna Solo.    Primary Care Physician:  [name], MD Primary Gastroenterologist:  Dr. Laural Golden    Reason for Consultation: Watery, not frequent stool.   HPI: Anthony Dunlap is a 84 y.o. male.  PMH IDDM.  Prostate cancer with chronic Foley.  Hypothyroidism.  Reactive thrombocytosis.  Urethral stricture, cystoscopy/stricture dilation 2009, right inguinal hernia repair 2011.  Enterococcal UTI 11/2016 2003 colonoscopy, average risk screening study.  By doctor Rehman.  Ablation, no biopsy/polypectomy of small solitary polyp in the transverse colon, external hemorrhoids, mild melanosis coli.   Admitted more than 2 weeks ago with sepsis, DKA, abscess of the rectus sheath, left thigh and pelvic osteomyelitis.  Grew Enterococcus faecalis and Klebsiella from his wound, GPC/coag negative staph on blood cultures 11/15.   Rectus sheath abscess was drained in IR on 01/25/2018.  Underwent IR aspiration and drain placement to thigh abscess 12/11.  Plan is to continue Unasyn for total of 8 weeks by a PIC.  Lower extremity edema with echocardiogram showing EF 60%, Grade 1 DD, noncritical calcification of aortic, mitral valves, nodular thickening tricuspid valve.  No pericardial effusion. No endocarditis.  Patient's recall of healthcare details is limited, he has trouble remembering how long he has had specific symptoms.  During this hospitalization he has developed first clear, mucoid stool and today he has clear watery stool.  He is not having frequent stools, yesterday just 1 stool. Getting colace 2 x day.  Patient cannot tell me when the change in his stool occurred but he was not having this at home.  No anorexia, no nausea vomiting, no abdominal  pain, no dysphagia.  Patient required 1 U PRBC on 12/17 for hemoglobins dropped to 6.8. Patient has been anemic from the get go,  Hgb 01/03/18: 9.2.  It was 8.7 at arrival 11/29.  Drifted to 6.8 on 12/17, 9.1 after 1 U PRBC.  It has drifted back down to 7.5 today.  At times MCV as low as 79. Iron, TIBC, iron sats all low.  Ferritin 329.  Folate, B12 normal. TSH 3.9 FOBT negative.  Gastrointestinal panel negative. No renal dysfunction.  Electrolytes deranged with low sodium, low potassium.  DKA resolved. Note elevated LFTs.  These have not been checked since 01/31/2018.  Trending increase in alkaline phosphatase starting 11/15 >> 330 on 12/13.  Alkaline phosphatase 56, otherwise normal LFTs. CT abdomen, pelvis with contrast 01/26/2018 showed mild diffuse rectal wall thickening most likely due to proctitis. Abdominal ultrasound Doppler 02/01/2018: CBD 3 mm.  No gallbladder stones, gallbladder wall thickening.  Liver normal.  Portal vein patent with normal directional flow. Patient gives a vague history but definitely denies abdominal pain, nausea.  He says he is eating well.  Does not suffer from reflux symptoms, no dysphagia.  At home his stools were brown, formed.  He denies small caliber stools.  Occasionally constipated but not a serious problem.  Family history pertinent for a sister who has had some sort of gastrointestinal  illness leading to either gastric or intestinal resection.  Not sure if this is cancer related or not.  Past Medical History:  Diagnosis Date  . Diabetes ([place])   . Left eye pain   . Prostate cancer [facility])     Past Surgical History:  Procedure Laterality Date  . HERNIA REPAIR    . PROSTATE SURGERY      Prior to Admission medications   Medication Sig Start Date End Date Taking? Authorizing Provider  aspirin EC 81 MG tablet Take 81 mg by mouth daily.   Yes [provider]  LEVEMIR FLEXTOUCH 100 UNIT/ML Pen Inject 30 Units into the skin daily. 01/07/18  Yes  Kathie Dike, MD  levothyroxine (SYNTHROID, LEVOTHROID) 25 MCG tablet Take 25 mcg by mouth daily before breakfast.   Yes [provider]  pravastatin (PRAVACHOL) 10 MG tablet Take 10 mg by mouth daily.   Yes [provider]    Scheduled Meds: . sodium chloride   Intravenous Once  . aspirin  81 mg Oral Daily  . docusate sodium  100 mg Oral BID  . enoxaparin (LOVENOX) injection  40 mg Subcutaneous Q24H  . feeding supplement (GLUCERNA SHAKE)  237 mL Oral BID BM  . furosemide  20 mg Oral Daily  . insulin aspart  0-9 Units Subcutaneous TID WC  . insulin detemir  8 Units Subcutaneous QHS  . levothyroxine  25 mcg Oral Q0600  . magnesium oxide  400 mg Oral Daily  . potassium chloride  40 mEq Oral Daily  . povidone-iodine  2 application Topical Once  . pravastatin  10 mg Oral q1800  . sodium chloride flush  10-40 mL Intracatheter Q12H  . sodium chloride flush  5 mL Intracatheter Q8H  . sodium chloride flush  5 mL Intracatheter Q8H   Infusions: . sodium chloride 250 mL (02/08/18 0852)  . ampicillin-sulbactam (UNASYN) IV 3 g (02/10/18 0438)  . potassium chloride     PRN Meds: sodium chloride, acetaminophen, ibuprofen, ondansetron (ZOFRAN) IV, sodium chloride flush   Allergies as of 01/17/2018  . (No Known Allergies)    Family History  Problem Relation Age of Onset  . Pneumonia Father   . Cancer Sister     Social History   Socioeconomic History  . Marital status: Married    Spouse name: [name]  . Number of children: 3  . Years of education: 9th  . Highest education level: Not on file  Occupational History    Employer: RETIRED    Comment: Retired  Scientific laboratory technician  . Financial resource strain: Not on file  . Food insecurity:    Worry: Not on file    Inability: Not on file  . Transportation needs:    Medical: Not on file    Non-medical: Not on file  Tobacco Use  . Smoking status: Never Smoker  . Smokeless tobacco: Never Used  Substance and Sexual  Activity  . Alcohol use: No  . Drug use: No  . Sexual activity: Not on file  Lifestyle  . Physical activity:    Days per week: Not on file    Minutes per session: Not on file  . Stress: Not on file  Relationships  . Social connections:    Talks on phone: Not on file    Gets together: Not on file    Attends religious service: Not on file    Active member of club or organization: Not on file    Attends meetings of clubs or  organizations: Not on file    Relationship status: Not on file  . Intimate partner violence:    Fear of current or ex partner: Not on file    Emotionally abused: Not on file    Physically abused: Not on file    Forced sexual activity: Not on file  Other Topics Concern  . Not on file  Social History Narrative   Patient lives at home with his wife. [name]) . Patient is retired.   Education 9th grade.   Right handed.   Caffeine None    REVIEW OF SYSTEMS: Constitutional: Denies fatigue, weakness. ENT:  No nose bleeds Pulm: Shortness of breath and cough. CV:  No palpitations, no LE edema.  No chest pain.  Lower extremity edema is new, he cannot tell me how long it has been there. GU:  No hematuria, no frequency GI:  Per HPI Heme: Denies excessive bleeding/bruising Transfusions: Prior to 12/17, patient does not recall previous transfusions. Neuro:  No headaches, no peripheral tingling or numbness Derm:  No itching, no rash or sores.  Endocrine:  No sweats or chills.  No polyuria or dysuria Immunization:  Not queried.   Travel:  None beyond local counties in last few months.    PHYSICAL EXAM: Vital signs in last 24 hours: Vitals:   02/09/18 2034 02/10/18 0543  BP: 119/71 135/87  Pulse: (!) 109 (!) 110  Resp: 16 12  Temp: 98.9 F (37.2 C) 97.6 F (36.4 C)  SpO2: 96% 97%   Wt Readings from Last 3 Encounters:  01/23/18 72.9 kg  01/03/18 70.3 kg  06/19/17 72.6 kg    General: Thin, somewhat malnourished looking WM who is comfortable.  Does not  look acutely ill. Head: Asymmetry or swelling.  No signs of head trauma. Eyes: Conjunctiva slightly pale.  No icterus.  EOMI. Ears: Slightly HOH. Nose: No discharge or congestion Mouth: Poor dentition.  Oral mucosa pink, moist, clear.  Tongue midline. Neck: No JVD, thyromegaly, masses. Lungs: Crackles in the bases.  No labored breathing or cough at rest. Heart: RRR.  No MRG.  S1, S2 present. Abdomen: Not tender, not distended.  Soft.  No organomegaly, bruits, hernias..   Rectal: No DRE performed.  Saw stool specimen in specimen cup, this looks like slightly cloudy tap water.  There is no brown coloration, no blood. Musc/Skeltl: No joint redness or swelling. Extremities:  2 to 3+ hitting edema in the lower legs, top of the foot. Drain in the left upper thigh contains serosanguineous fluid. Neurologic: Oriented x3.  Moves all 4 limbs, full strength.  No tremor, no asterixis. Skin: Other than the thigh there are no suspicious lesions, rashes, sores. Nodes: No cervical adenopathy. Psych: Operative, pleasant.  Home.  Intake/Output from previous day: 12/22 0701 - 12/23 0700 In: 455.3 [IV Piggyback:345.3] Out: 2535 [Urine:1300; Drains:1235] Intake/Output this shift: Total I/O In: -  Out: 100 [Drains:100]  LAB RESULTS: Recent Labs    02/08/18 0349 02/09/18 0457  WBC 9.7 9.2  HGB 7.5* 7.5*  HCT 25.3* 25.2*  PLT 698* 649*   BMET Lab Results  Component Value Date   NA 134 (L) 02/10/2018   NA 133 (L) 02/09/2018   NA 134 (L) 02/08/2018   K 2.8 (L) 02/10/2018   K 3.1 (L) 02/09/2018   K 3.2 (L) 02/08/2018   CL 94 (L) 02/10/2018   CL 96 (L) 02/09/2018   CL 96 (L) 02/08/2018   CO2 28 02/10/2018   CO2 27 02/09/2018  CO2 28 02/08/2018   GLUCOSE 166 (H) 02/10/2018   GLUCOSE 129 (H) 02/09/2018   GLUCOSE 118 (H) 02/08/2018   BUN 14 02/10/2018   BUN 10 02/09/2018   BUN 10 02/08/2018   CREATININE 1.02 02/10/2018   CREATININE 0.74 02/09/2018   CREATININE 0.70 02/08/2018    CALCIUM 7.5 (L) 02/10/2018   CALCIUM 7.5 (L) 02/09/2018   CALCIUM 7.5 (L) 02/08/2018   LFT No results for input(s): PROT, ALBUMIN, AST, ALT, ALKPHOS, BILITOT, BILIDIR, IBILI in the last 72 hours. PT/INR Lab Results  Component Value Date   INR 1.15 01/25/2018   INR 1.31 01/17/2018   INR 1.20 01/03/2018    RADIOLOGY STUDIES: No results found.  IMPRESSION:   *  Watery but not frequent stools.  No associated GI sxs.  Not clear he has ever had a colonoscopy, nothing in records.  Stool studies so far with negative pathogen panel, FOBT negative.   C. difficile has not been ordered. Given patient is not having stool frequency, abdominal pain, nausea vomiting etc. there is low suspicion for gastrointestinal infection.  Suspect abx related change in bowels.  *    Remote colonoscopy with ablation of small polyp in 2003.  *    Microcytic anemia, iron studies reveal iron deficiency.  Suspect anemia of chronic disease  *   Elevated alkaline phosphatase, suspect this is from the pubic symphysis osteomyelitis.  Ultrasound and CT abdomen did not suggest any biliary, liver issues and he has no GI issues other than the watery stool stool  *   Left thigh abscess, status post IR drainage.   Associated pubic symphysis osteomyelitis.  Will be on several weeks of Unasyn.  *   IDDM, not well controlled.  Hemoglobin A1c is greater than 9.  Presented with DKA in the setting of acute infection.   PLAN:     *   Just to make sure, I ordered C. difficile study.  Dr. Tarri Glenn will see the patient later.  *   Stop colace.     Azucena Freed  02/10/2018, 9:59 AM Phone [phone]

## 2018-02-10 NOTE — Progress Notes (Addendum)
Physical Therapy Treatment Patient Details Name: Anthony Dunlap MRN: [id number] DOB: 1933/03/14 Today's Date: 02/10/2018    History of Present Illness 84 y.o. male with medical history significant for hypothyroidism, insulin-dependent diabetes mellitus, hyperlipidemia, and history of prostate cancer with chronic Foley catheter, now presenting to the emergency department for evaluation of rigors.  Patient reports that he was in his usual state of health until overnight when he began sweating and developed shaking chills.  In the ED, pt was febrile with HR in 140s.  He also had serum glucose of 440 with anion gap of 12.  Pt admitted with sepsis and CT revealed muliple intramuscular abscesses of the abdomen and pelvis.  Pt is s/p drainage placement on 12/7 and drainage placement on 12/11.     PT Comments    Pt continues to make slow progress towards his goals, however is limited in safe mobility by decreased strength and endurance as well as chronic incontinence of stool with standing. Pt requires minA for bed mobility and min-modAx2 for standing to St[place] and mod-maxAx2 for standing to RW. D/c plans remain appropriate at this time. PT will continue to follow acutely.     Follow Up Recommendations  SNF;Supervision/Assistance - 24 hour     Equipment Recommendations       Recommendations for Other Services OT consult     Precautions / Restrictions Precautions Precautions: Fall Precaution Comments: 2 jackson prat drains in place  Restrictions Weight Bearing Restrictions: No    Mobility  Bed Mobility Overal bed mobility: Needs Assistance Bed Mobility: Supine to Sit     Supine to sit: Min assist     General bed mobility comments: minA for pt to pull against therapist to upright, increased effort on pt part to scoot hips to Eob  Transfers Overall transfer level: Needs assistance Equipment used: Standard walker Transfers: Sit to/from Stand Sit to Stand: +2 physical assistance;Mod  assist;Min assist;Max assist         General transfer comment: min A for intial sit>stands to [facility] for transfer to chair. Pt incontinent of stool with standing once cleaned attempted to stand to RW requiring maxAx2, once in standing again incontinent of stool, attempted to clean in standing however became weak and needed to sit. Swithced back to [place] for transfer and pt was able to stand with modAx2. Cleaning finished and pt returned to recliner  Ambulation/Gait             General Gait Details: unable       Balance           Standing balance support: During functional activity;Bilateral upper extremity supported Standing balance-Leahy Scale: Poor                              Cognition Arousal/Alertness: Awake/alert Behavior During Therapy: WFL for tasks assessed/performed Overall Cognitive Status: Within Functional Limits for tasks assessed                                           General Comments  Daughter present at end of session       Pertinent Vitals/Pain Pain Assessment: Faces Faces Pain Scale: Hurts a little bit Pain Location: bilateral knee with flexion  Pain Descriptors / Indicators: Grimacing Pain Intervention(s): Limited activity within patient's tolerance;Monitored during session;Repositioned  PT Goals (current goals can now be found in the care plan section) Acute Rehab PT Goals Patient Stated Goal: return home with family to assist PT Goal Formulation: With patient Time For Goal Achievement: 02/11/18 Potential to Achieve Goals: Poor Progress towards PT goals: Progressing toward goals    Frequency    Min 3X/week      PT Plan Current plan remains appropriate       AM-PAC PT "6 Clicks" Mobility   Outcome Measure  Help needed turning from your back to your side while in a flat bed without using bedrails?: A Lot Help needed moving from lying on your back to sitting on the side of a flat bed  without using bedrails?: A Little Help needed moving to and from a bed to a chair (including a wheelchair)?: Total Help needed standing up from a chair using your arms (e.g., wheelchair or bedside chair)?: A Lot Help needed to walk in hospital room?: Total Help needed climbing 3-5 steps with a railing? : Total 6 Click Score: 10    End of Session Equipment Utilized During Treatment: Gait belt Activity Tolerance: Patient limited by fatigue;Patient tolerated treatment well Patient left: in chair;with call bell/phone within reach;with family/visitor present Nurse Communication: Mobility status PT Visit Diagnosis: Unsteadiness on feet (R26.81);Other abnormalities of gait and mobility (R26.89);Muscle weakness (generalized) (M62.81)     Time: 7253-6644 PT Time Calculation (min) (ACUTE ONLY): 37 min  Charges:     $Therapeutic Exercise: 23-37 mins                     Hulen Mandler B. Migdalia Dk PT, DPT Acute Rehabilitation Services Pager [phone] Office [phone]    [place] 02/10/2018, 12:52 PM

## 2018-02-10 NOTE — Consult Note (Signed)
[place] Nurse wound consult note Reason for Consult: Consult requested for bilat heels.  Pt has bony prominences which protrude to posterior heel areas Wound type: Stage 1 pressure injuries to bilat heels Pressure Injury POA: No Measurement: each side is .5X.5cm, red and nonblanchable.  No open wound, drainage, or fluctuance Dressing procedure/placement/frequency: Foam dressings have been applied to attempt to protect the locations and heels are floated to reduce pressure.  No further topical treatment is indicated at this time. Discussed plan of care with patient. Please re-consult if further assistance is needed.  Thank-you,  Julien Girt MSN, [place], [place], [place], [place]

## 2018-02-10 NOTE — Progress Notes (Signed)
PROGRESS NOTE    Anthony Dunlap  ULA:[id number] DOB: 1933-05-16 DOA: 01/17/2018 PCP: [name], MD   Brief Narrative: Patient is 84 year old male with past medical history of hypothyroidism, insulin-dependent diabetes, hyperlipidemia, prostate cancer with chronic Foley catheter who presented with sepsis and DKA.  Blood culture grew Peptostreptococcus and Bacteroides. Wound culture grew Enterococcus faecalis and Klebsiella pneumonia.  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  IR consulted and he  is status post drain placement.  Patient was also seen by infectious disease who recommended 8 weeks of IV antibiotics.  DKA has resolved. Hemoglobin dropped  to 6.8. so he was transfused with one unit of PRBC on 02/04/18.  Currently waiting for clearance from IR for discharge to skilled nursing facility.  Assessment & Plan:   Principal Problem:   Enterococcus faecalis infection Active Problems:   Sepsis secondary to UTI ([place])   Hypothyroidism   DKA (diabetic ketoacidoses) ([name])   Normocytic anemia   Sepsis due to Bacteroides species ([name])   Sepsis without acute organ dysfunction ([name])   Psoas abscess ([name])   Abscess   Pressure injury of skin  Bacteremia/sepsis: Blood cultures grew bacteroids and Peptostreptococcus.  Wound cultures grew enterococcus and Klebsiella pneumonia.  CT abdomen/pelvis showed osteomyelitis of the pubic symphysis with abscess in the rectus sheath and left thigh.  Status post drains placement.  Rectus sheath abscess-S/P drain placement by Dr. Kathlene Cote 01/25/2018 Thigh abscess-S/P Aspiration and drain placement anterior thigh abscess 01/29/18 by Dr. Laurence Ferrari  IR following.  Plan is to continue IV Unasyn for total of 8 weeks.  Echocardiogram was unrevealing for endocarditis. Leukocytosis improved . PICC line placed.  Last blood cultures have been negative. IR planning to repeat CT when  thigh drain has about 20 mL or less of output.  Thigh drain still has significant output, but the drainage is clear today.  DKA: Resolved.  Insulin drip was stopped.  Transition to Levemir and sliding scale insulin.  Dose of long-acting insulin decreased.  Hypothyroidism: Continue Synthyroid  Prostate cancer: Has chronic Foley catheter placed  History of reactive thrombocytosis: On aspirin 81 mg daily.    Hyperlipidemia: Continue statin  Lower extremity edema: Edema just confined to bilateral lower legs .continue PO lasix for now.Decreased it to 20 mg daily. Lower extremity edema still significant.  His echocardiogram has shown ejection fraction of 66 5%, grade 1 diastolic dysfunction. We rcommend to elevate the legs.Apply compression stockings.  Hypokalemia/hypomagnesemia: Currently being supplemented.   Anemia: Hemoglobin today 7.5.  Most likely associated with his chronic medical problems.  No hematochezia or melena.    Transfused with 1 unit of PRBC on 02/04/18. FOBT  sent and found to be negative.  Jelly like stool: Unknown etiology.  He is eating fine.  His stool was noticed to be clear jelly like.  Negative GI pathogen panel.  He does not complain of any abdominal pain.  Denies any diarrhea.  We will closely monitor his bowel movements and check stool every time when he defecates.  Consulted GI today.  C. difficile.  Deconditioning/debility: Physical therapy evaluated him and recommends SNF on  Discharge.         DVT prophylaxis: SCD Code Status: Full Family Communication: None present at the bedside Disposition Plan: Skilled nursing facility after clearance from IR( likely after removal of drain) and workup from GI   Consultants: IR, ID  Procedures: Placement of drains  Antimicrobials: Currently on  Unasyn since 12/7  Subjective: Patient seen and examined the bedside this afternoon.  Remains hemodynamically stable.  Denies any specific complaints.  Feels better.  Thigh  drain today still has  output of significant volume of  Clear  fluid.Still has lower extremity edema.  Objective: Vitals:   02/09/18 0521 02/09/18 1201 02/09/18 2034 02/10/18 0543  BP: (!) 144/81 116/66 119/71 135/87  Pulse: (!) 112 (!) 111 (!) 109 (!) 110  Resp: 16 18 16 12   Temp: 98.9 F (37.2 C) 98.8 F (37.1 C) 98.9 F (37.2 C) 97.6 F (36.4 C)  TempSrc: Oral Oral Oral Oral  SpO2: 96% 97% 96% 97%  Weight:      Height:        Intake/Output Summary (Last 24 hours) at 02/10/2018 1159 Last data filed at 02/10/2018 0930 Gross per 24 hour  Intake 605.26 ml  Output 2135 ml  Net -1529.74 ml   Filed Weights   01/17/18 0209 01/17/18 0600 01/23/18 2337  Weight: 70.3 kg 67.1 kg 72.9 kg    Examination:  General exam: Appears calm and comfortable ,Not in distress,average built,weak HEENT:PERRL,Oral mucosa moist, Ear/Nose normal on gross exam Respiratory system: Bilateral equal air entry, normal vesicular breath sounds, no wheezes or crackles  Cardiovascular system: S1 & S2 heard, RRR. No JVD, murmurs, rubs, gallops or clicks.  Gastrointestinal system: Abdomen is nondistended, soft and nontender. No organomegaly or masses felt. Normal bowel sounds heard.Abdominal drain with clear fluid Central nervous system: Alert and oriented. No focal neurological deficits. Extremities: significant lower extremity edema, no clubbing ,no cyanosis, distal peripheral pulses palpable.Drain on the left upper thigh draining clear  Fluid.No edema on other body parts. Psychiatry: Judgement and insight appear normal. Mood & affect appropriate.     Data Reviewed: I have personally reviewed following labs and imaging studies  CBC: Recent Labs  Lab 02/04/18 0231 02/05/18 1033 02/06/18 0254 02/08/18 0349 02/09/18 0457  WBC 14.6*  --  12.7* 9.7 9.2  NEUTROABS  --   --  9.9* 7.5 7.1  HGB 6.8* 9.1* 8.2* 7.5* 7.5*  HCT 22.9* 30.4* 26.0* 25.3* 25.2*  MCV 79.5*  --  80.2 81.9 81.6  PLT 777*  --  782*  698* 161*   Basic Metabolic Panel: Recent Labs  Lab 02/04/18 0231  02/06/18 0254 02/07/18 0510 02/08/18 0349 02/09/18 0457 02/10/18 0342  NA 132*   < > 135 135 134* 133* 134*  K 2.8*   < > 3.1* 3.4* 3.2* 3.1* 2.8*  CL 96*   < > 96* 97* 96* 96* 94*  CO2 24   < > 28 27 28 27 28   GLUCOSE 220*   < > 130* 60* 118* 129* 166*  BUN 9   < > 9 9 10 10 14   CREATININE 0.71   < > 0.64 0.64 0.70 0.74 1.02  CALCIUM 7.4*   < > 7.5* 7.7* 7.5* 7.5* 7.5*  MG 1.6*  --  1.8  --   --  1.8  --    < > = values in this interval not displayed.   GFR: Estimated Creatinine Clearance: 54 mL/min (by C-G formula based on SCr of 1.02 mg/dL). Liver Function Tests: No results for input(s): AST, ALT, ALKPHOS, BILITOT, PROT, ALBUMIN in the last 168 hours. No results for input(s): LIPASE, AMYLASE in the last 168 hours. No results for input(s): AMMONIA in the last 168 hours. Coagulation Profile: No results for input(s): INR, PROTIME in the last 168 hours. Cardiac Enzymes: No  results for input(s): CKTOTAL, CKMB, CKMBINDEX, TROPONINI in the last 168 hours. BNP (last 3 results) No results for input(s): PROBNP in the last 8760 hours. HbA1C: No results for input(s): HGBA1C in the last 72 hours. CBG: Recent Labs  Lab 02/09/18 1720 02/09/18 2036 02/09/18 2256 02/10/18 0144 02/10/18 0813  GLUCAP 146* 326* 437* 295* 153*   Lipid Profile: No results for input(s): CHOL, HDL, LDLCALC, TRIG, CHOLHDL, LDLDIRECT in the last 72 hours. Thyroid Function Tests: No results for input(s): TSH, T4TOTAL, FREET4, T3FREE, THYROIDAB in the last 72 hours. Anemia Panel: No results for input(s): VITAMINB12, FOLATE, FERRITIN, TIBC, IRON, RETICCTPCT in the last 72 hours. Sepsis Labs: No results for input(s): PROCALCITON, LATICACIDVEN in the last 168 hours.  Recent Results (from the past 240 hour(s))  Culture, blood (routine x 2)     Status: None   Collection Time: 02/01/18 12:50 PM  Result Value Ref Range Status   Specimen  Description BLOOD RIGHT HAND  Final   Special Requests   Final    BOTTLES DRAWN AEROBIC ONLY Blood Culture adequate volume   Culture   Final    NO GROWTH 5 DAYS Performed at [facility], [address] [place], [place] [address]    Report Status 02/06/2018 FINAL  Final  Culture, blood (routine x 2)     Status: None   Collection Time: 02/01/18 12:52 PM  Result Value Ref Range Status   Specimen Description BLOOD RIGHT WRIST  Final   Special Requests   Final    BOTTLES DRAWN AEROBIC ONLY Blood Culture adequate volume   Culture   Final    NO GROWTH 5 DAYS Performed at [facility], [place] [address], [place], [place] [address]    Report Status 02/06/2018 FINAL  Final  Gastrointestinal Panel by PCR , Stool     Status: None   Collection Time: 02/08/18 10:36 PM  Result Value Ref Range Status   Campylobacter species NOT DETECTED NOT DETECTED Final   Plesimonas shigelloides NOT DETECTED NOT DETECTED Final   Salmonella species NOT DETECTED NOT DETECTED Final   Yersinia enterocolitica NOT DETECTED NOT DETECTED Final   Vibrio species NOT DETECTED NOT DETECTED Final   Vibrio cholerae NOT DETECTED NOT DETECTED Final   Enteroaggregative E coli (EAEC) NOT DETECTED NOT DETECTED Final   Enteropathogenic E coli (EPEC) NOT DETECTED NOT DETECTED Final   Enterotoxigenic E coli (ETEC) NOT DETECTED NOT DETECTED Final   Shiga like toxin producing E coli (STEC) NOT DETECTED NOT DETECTED Final   Shigella/Enteroinvasive E coli (EIEC) NOT DETECTED NOT DETECTED Final   Cryptosporidium NOT DETECTED NOT DETECTED Final   Cyclospora cayetanensis NOT DETECTED NOT DETECTED Final   Entamoeba histolytica NOT DETECTED NOT DETECTED Final   Giardia lamblia NOT DETECTED NOT DETECTED Final   Adenovirus F40/41 NOT DETECTED NOT DETECTED Final   Astrovirus NOT DETECTED NOT DETECTED Final   Norovirus GI/GII NOT DETECTED NOT DETECTED Final   Rotavirus A NOT DETECTED NOT DETECTED Final   Sapovirus (I, II, IV, and  V) NOT DETECTED NOT DETECTED Final    Comment: Performed at [facility], [address] [place], [place] [address]         Radiology Studies: No results found.      Scheduled Meds: . sodium chloride   Intravenous Once  . acidophilus  1 capsule Oral Daily  . aspirin  81 mg Oral Daily  . enoxaparin (LOVENOX) injection  40 mg Subcutaneous Q24H  .  feeding supplement (GLUCERNA SHAKE)  237 mL Oral BID BM  . furosemide  20 mg Oral Daily  . insulin aspart  0-9 Units Subcutaneous TID WC  . insulin detemir  8 Units Subcutaneous QHS  . levothyroxine  25 mcg Oral Q0600  . magnesium oxide  400 mg Oral Daily  . potassium chloride  40 mEq Oral Daily  . povidone-iodine  2 application Topical Once  . pravastatin  10 mg Oral q1800  . psyllium  1 packet Oral Daily  . sodium chloride flush  10-40 mL Intracatheter Q12H  . sodium chloride flush  5 mL Intracatheter Q8H  . sodium chloride flush  5 mL Intracatheter Q8H   Continuous Infusions: . sodium chloride 250 mL (02/08/18 0852)  . ampicillin-sulbactam (UNASYN) IV 3 g (02/10/18 1122)  . potassium chloride 10 mEq (02/10/18 1125)     LOS: 24 days    Time spent: 35 mins.More than 50% of that time was spent in counseling and/or coordination of care.      Shelly Coss, MD [facility] Pager [phone]  If 7PM-7AM, please contact night-coverage [email or web address] Password [email or web address] 02/10/2018, 11:59 AM

## 2018-02-10 NOTE — Plan of Care (Signed)

## 2018-02-11 ENCOUNTER — Inpatient Hospital Stay (HOSPITAL_COMMUNITY): Payer: Medicare Other

## 2018-02-11 DIAGNOSIS — R197 Diarrhea, unspecified: Secondary | ICD-10-CM

## 2018-02-11 DIAGNOSIS — D509 Iron deficiency anemia, unspecified: Secondary | ICD-10-CM

## 2018-02-11 LAB — CBC WITH DIFFERENTIAL/PLATELET
Abs Immature Granulocytes: 0.05 10*3/uL (ref 0.00–0.07)
Basophils Absolute: 0 10*3/uL (ref 0.0–0.1)
Basophils Relative: 0 %
Eosinophils Absolute: 0.3 10*3/uL (ref 0.0–0.5)
Eosinophils Relative: 4 %
HCT: 25 % — ABNORMAL LOW (ref 39.0–52.0)
Hemoglobin: 7.6 g/dL — ABNORMAL LOW (ref 13.0–17.0)
Immature Granulocytes: 1 %
Lymphocytes Relative: 14 %
Lymphs Abs: 1.2 10*3/uL (ref 0.7–4.0)
MCH: 24.8 pg — ABNORMAL LOW (ref 26.0–34.0)
MCHC: 30.4 g/dL (ref 30.0–36.0)
MCV: 81.7 fL (ref 80.0–100.0)
Monocytes Absolute: 0.8 10*3/uL (ref 0.1–1.0)
Monocytes Relative: 9 %
Neutro Abs: 6.1 10*3/uL (ref 1.7–7.7)
Neutrophils Relative %: 72 %
Platelets: 712 10*3/uL — ABNORMAL HIGH (ref 150–400)
RBC: 3.06 MIL/uL — ABNORMAL LOW (ref 4.22–5.81)
RDW: 15 % (ref 11.5–15.5)
WBC: 8.5 10*3/uL (ref 4.0–10.5)
nRBC: 0 % (ref 0.0–0.2)

## 2018-02-11 LAB — TSH: TSH: 6.382 u[IU]/mL — ABNORMAL HIGH (ref 0.350–4.500)

## 2018-02-11 LAB — BASIC METABOLIC PANEL
Anion gap: 11 (ref 5–15)
BUN: 10 mg/dL (ref 8–23)
CO2: 26 mmol/L (ref 22–32)
Calcium: 7.5 mg/dL — ABNORMAL LOW (ref 8.9–10.3)
Chloride: 95 mmol/L — ABNORMAL LOW (ref 98–111)
Creatinine, Ser: 0.69 mg/dL (ref 0.61–1.24)
GFR calc Af Amer: >60 mL/min (ref >60)
GFR calc non Af Amer: >60 mL/min (ref >60)
Glucose, Bld: 149 mg/dL — ABNORMAL HIGH (ref 70–99)
Potassium: 3 mmol/L — ABNORMAL LOW (ref 3.5–5.1)
Sodium: 132 mmol/L — ABNORMAL LOW (ref 135–145)

## 2018-02-11 LAB — GLUCOSE, CAPILLARY
Glucose-Capillary: 103 mg/dL — ABNORMAL HIGH (ref 70–99)
Glucose-Capillary: 121 mg/dL — ABNORMAL HIGH (ref 70–99)
Glucose-Capillary: 119 mg/dL — ABNORMAL HIGH (ref 70–99)
Glucose-Capillary: 146 mg/dL — ABNORMAL HIGH (ref 70–99)

## 2018-02-11 MED ORDER — POTASSIUM CHLORIDE CRYS ER 20 MEQ PO TBCR
40.0000 meq | EXTENDED_RELEASE_TABLET | ORAL | Status: AC
  Administered 2018-02-11 (×2): 40 meq via ORAL
  Filled 2018-02-11: qty 2

## 2018-02-11 MED ORDER — LIDOCAINE HCL 1 % IJ SOLN
INTRAMUSCULAR | Status: AC
  Filled 2018-02-11: qty 20

## 2018-02-11 MED ORDER — GADOBUTROL 1 MMOL/ML IV SOLN
7.5000 mL | Freq: Once | INTRAVENOUS | Status: AC | PRN
  Administered 2018-02-11: 7.5 mL via INTRAVENOUS

## 2018-02-11 MED ORDER — IOPAMIDOL (ISOVUE-300) INJECTION 61%
INTRAVENOUS | Status: AC
  Filled 2018-02-11: qty 50

## 2018-02-11 NOTE — Progress Notes (Signed)
Paged provider notification [phone].

## 2018-02-11 NOTE — Plan of Care (Signed)
  Problem: Elimination: Goal: Will not experience complications related to bowel motility Outcome: Progressing Goal: Will not experience complications related to urinary retention Outcome: Progressing   Problem: Pain Managment: Goal: General experience of comfort will improve Outcome: Progressing   Problem: Skin Integrity: Goal: Risk for impaired skin integrity will decrease Outcome: Progressing   

## 2018-02-11 NOTE — Progress Notes (Signed)
Daily Rounding Note  02/11/2018, 2:23 PM  LOS: 25 days   SUBJECTIVE:   Chief complaint:  Watery stool Stool is still loose but a lot less watery.  He is only had 2, brown BMs today. Currently off the floor getting an MRI.   The drain into his thigh abscess came out and IR wants MRI imaging before embarking on replacing the drain.  OBJECTIVE:         Vital signs in last 24 hours:    Temp:  [97.9 F (36.6 C)-98.6 F (37 C)] 98.3 F (36.8 C) (12/24 1208) Pulse Rate:  [101-105] 101 (12/24 1208) Resp:  [16-17] 17 (12/24 1208) BP: (125-153)/(74-85) 125/76 (12/24 1208) SpO2:  [92 %-99 %] 92 % (12/24 1208) Last BM Date: 02/10/18(clear) Filed Weights   01/17/18 0209 01/17/18 0600 01/23/18 2337  Weight: 70.3 kg 67.1 kg 72.9 kg   Unable to evaluate the patient because he is off the floor  Intake/Output from previous day: 12/23 0701 - 12/24 0700 In: 1610.9 [P.O.:690; I.V.:110; IV Piggyback:810.9] Out: 2715 [Urine:2425; Drains:290]  Intake/Output this shift: Total I/O In: -  Out: 880 [Urine:800; Drains:80]  Lab Results: Recent Labs    02/09/18 0457 02/11/18 0356  WBC 9.2 8.5  HGB 7.5* 7.6*  HCT 25.2* 25.0*  PLT 649* 712*   BMET Recent Labs    02/09/18 0457 02/10/18 0342 02/11/18 0356  NA 133* 134* 132*  K 3.1* 2.8* 3.0*  CL 96* 94* 95*  CO2 27 28 26   GLUCOSE 129* 166* 149*  BUN 10 14 10   CREATININE 0.74 1.02 0.69  CALCIUM 7.5* 7.5* 7.5*   Scheduled Meds: . sodium chloride   Intravenous Once  . acidophilus  1 capsule Oral Daily  . aspirin  81 mg Oral Daily  . enoxaparin (LOVENOX) injection  40 mg Subcutaneous Q24H  . feeding supplement (GLUCERNA SHAKE)  237 mL Oral BID BM  . furosemide  20 mg Oral Daily  . insulin aspart  0-9 Units Subcutaneous TID WC  . insulin detemir  8 Units Subcutaneous QHS  . levothyroxine  25 mcg Oral Q0600  . magnesium oxide  400 mg Oral Daily  . potassium chloride  40  mEq Oral Daily  . povidone-iodine  2 application Topical Once  . pravastatin  10 mg Oral q1800  . psyllium  1 packet Oral Daily  . sodium chloride flush  10-40 mL Intracatheter Q12H  . sodium chloride flush  5 mL Intracatheter Q8H  . sodium chloride flush  5 mL Intracatheter Q8H   Continuous Infusions: . sodium chloride 250 mL (02/08/18 0852)  . ampicillin-sulbactam (UNASYN) IV 3 g (02/11/18 1241)   PRN Meds:.sodium chloride, acetaminophen, ibuprofen, ondansetron (ZOFRAN) IV, sodium chloride flush  ASSESMENT:   *  Watery but not frequent stools.  No associated GI sxs.  Not clear he has ever had a colonoscopy, nothing in records.  FOBT negative, C diff and GI path panel negative.  Suspect abx related, noninfectious, diarrhea. Stool has improved with the addition of psyllium and probiotic yesterday  *    Remote colonoscopy with ablation of small polyp in 2003 per Dr Laural Golden.    *    Microcytic anemia, iron studies reveal iron deficiency.  Suspect anemia of acute and chronic disease  *   Left thigh abscess, status post IR drainage.   Associated pubic symphysis osteomyelitis.  Will be on several weeks of Unasyn. Pain fell out and is  probably going to need to be replaced.  MRI in process.  *   IDDM, not well controlled.  Hemoglobin A1c is greater than 9.  Presented with DKA in the setting of acute infection.    PLAN   *    GI will sign off.  Continue the psyllium and probiotics. After discharge, if GI follow-up is required, he can see Dr. Dereck Leep.    Anthony Dunlap  02/11/2018, 2:23 PM Phone [phone]

## 2018-02-11 NOTE — Progress Notes (Signed)
PROGRESS NOTE    Anthony Dunlap  UVO:[id number] DOB: April 20, 1933 DOA: 01/17/2018 PCP: [name], MD   Brief Narrative: Patient is 84 year old male with past medical history of hypothyroidism, insulin-dependent diabetes, hyperlipidemia, prostate cancer with chronic Foley catheter who presented with sepsis and DKA.  Blood culture grew Peptostreptococcus and Bacteroides. Wound culture grew Enterococcus faecalis and Klebsiella pneumonia.  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  IR consulted and he  is status post drain placement.  Patient was also seen by infectious disease who recommended 8 weeks of IV antibiotics.  DKA has resolved. Hemoglobin dropped  to 6.8. so he was transfused with one unit of PRBC on 02/04/18.   He was waiting for IR clearance for discharge with the plan for removal of the drain.  It was found that the drain from the left thigh was pulled out this morning.IR planning to put another drain today.  Assessment & Plan:   Principal Problem:   Enterococcus faecalis infection Active Problems:   Sepsis secondary to UTI ([place])   Hypothyroidism   DKA (diabetic ketoacidoses) ([name])   Normocytic anemia   Sepsis due to Bacteroides species ([name])   Sepsis without acute organ dysfunction ([name])   Psoas abscess ([name])   Abscess   Pressure injury of skin  Bacteremia/sepsis: Blood cultures grew bacteroids and Peptostreptococcus.  Wound cultures grew enterococcus and Klebsiella pneumonia.  CT abdomen/pelvis showed osteomyelitis of the pubic symphysis with abscess in the rectus sheath and left thigh.  Status post drains placement.  Rectus sheath abscess-S/P drain placement by Dr. Kathlene Cote 01/25/2018 Thigh abscess-S/P Aspiration and drain placement anterior thigh abscess 01/29/18 by Dr. Laurence Ferrari  IR following.  Plan is to continue IV Unasyn for total of 8 weeks.  Echocardiogram was unrevealing for  endocarditis. Leukocytosis improved . PICC line placed.  Last blood cultures have been negative. IR was planning to repeat CT when thigh drain has about 20 mL or less of output.  Thigh drain found to be out today.IR aware.  DKA: Resolved.  Insulin drip was stopped.  Transition to Levemir and sliding scale insulin.    Hypothyroidism: Continue Synthyroid  Prostate cancer: Has chronic Foley catheter placed  History of reactive thrombocytosis: On aspirin 81 mg daily.    Hyperlipidemia: Continue statin  Lower extremity edema: Edema just confined to bilateral lower legs .continue PO lasix for now.Decreased it to 20 mg daily. Lower extremity edema still significant.  His echocardiogram has shown ejection fraction of 66 5%, grade 1 diastolic dysfunction. We rcommend to elevate the legs.Apply compression stockings.  Hypokalemia/hypomagnesemia: Currently being supplemented.   Anemia:  Most likely associated with his chronic medical problems.  No hematochezia or melena.    Transfused with 1 unit of PRBC on 02/04/18. FOBT  sent and found to be negative.  Jelly like stool: Unknown etiology.  He is eating fine.  His stool was noticed to be clear jelly like.  Negative GI pathogen panel.  He does not complain of any abdominal pain..  We will closely monitor his bowel movements and check stool every time when he defecates.  Consulted GI today.  There might be a plan for colonoscopy.C. Difficile negative.  Deconditioning/debility: Physical therapy evaluated him and recommends SNF on  Discharge.         DVT prophylaxis: SCD Code Status: Full Family Communication: None present at the bedside Disposition Plan: Skilled nursing facility after clearance from IR and workup from  GI   Consultants: IR, ID  Procedures: Placement of drains  Antimicrobials: Currently on Unasyn since 12/7  Subjective: Patient seen and examined the bedside this afternoon.  Remains hemodynamically stable.  Denies any  specific complaints.  Thigh drain found to be out today.Nobody knows  what happened.  Might be accidentally pulled by the patient himself.still has lower extremity edema.  Objective: Vitals:   02/10/18 1440 02/10/18 2050 02/11/18 0540 02/11/18 1208  BP: 129/74 134/81 (!) 153/85 125/76  Pulse: (!) 105 (!) 102 (!) 102 (!) 101  Resp: 16 16 16 17   Temp: 98.6 F (37 C) 98.4 F (36.9 C) 97.9 F (36.6 C) 98.3 F (36.8 C)  TempSrc: Oral Oral Oral Oral  SpO2: 99% 97% 95% 92%  Weight:      Height:        Intake/Output Summary (Last 24 hours) at 02/11/2018 1326 Last data filed at 02/11/2018 1200 Gross per 24 hour  Intake 1460.89 ml  Output 3415 ml  Net -1954.11 ml   Filed Weights   01/17/18 0209 01/17/18 0600 01/23/18 2337  Weight: 70.3 kg 67.1 kg 72.9 kg    Examination:  General exam: Appears calm and comfortable ,Not in distress,average built, generalized weakness HEENT:PERRL,Oral mucosa moist, Ear/Nose normal on gross exam Respiratory system: Bilateral equal air entry, normal vesicular breath sounds, no wheezes or crackles  Cardiovascular system: S1 & S2 heard, RRR. No JVD, murmurs, rubs, gallops or clicks.  Gastrointestinal system: Abdomen is nondistended, soft and nontender. No organomegaly or masses felt. Normal bowel sounds heard.Abdominal drain with scant fluid Central nervous system: Alert and oriented. No focal neurological deficits. Extremities: significant lower extremity edema, no clubbing ,no cyanosis, distal peripheral pulses palpable.Drain on the left upper thigh out.No edema on other body parts. Psychiatry: Judgement and insight appear normal. Mood & affect appropriate.     Data Reviewed: I have personally reviewed following labs and imaging studies  CBC: Recent Labs  Lab 02/05/18 1033 02/06/18 0254 02/08/18 0349 02/09/18 0457 02/11/18 0356  WBC  --  12.7* 9.7 9.2 8.5  NEUTROABS  --  9.9* 7.5 7.1 6.1  HGB 9.1* 8.2* 7.5* 7.5* 7.6*  HCT 30.4* 26.0* 25.3*  25.2* 25.0*  MCV  --  80.2 81.9 81.6 81.7  PLT  --  782* 698* 649* 325*   Basic Metabolic Panel: Recent Labs  Lab 02/06/18 0254 02/07/18 0510 02/08/18 0349 02/09/18 0457 02/10/18 0342 02/11/18 0356  NA 135 135 134* 133* 134* 132*  K 3.1* 3.4* 3.2* 3.1* 2.8* 3.0*  CL 96* 97* 96* 96* 94* 95*  CO2 28 27 28 27 28 26   GLUCOSE 130* 60* 118* 129* 166* 149*  BUN 9 9 10 10 14 10   CREATININE 0.64 0.64 0.70 0.74 1.02 0.69  CALCIUM 7.5* 7.7* 7.5* 7.5* 7.5* 7.5*  MG 1.8  --   --  1.8  --   --    GFR: Estimated Creatinine Clearance: 68.9 mL/min (by C-G formula based on SCr of 0.69 mg/dL). Liver Function Tests: No results for input(s): AST, ALT, ALKPHOS, BILITOT, PROT, ALBUMIN in the last 168 hours. No results for input(s): LIPASE, AMYLASE in the last 168 hours. No results for input(s): AMMONIA in the last 168 hours. Coagulation Profile: No results for input(s): INR, PROTIME in the last 168 hours. Cardiac Enzymes: No results for input(s): CKTOTAL, CKMB, CKMBINDEX, TROPONINI in the last 168 hours. BNP (last 3 results) No results for input(s): PROBNP in the last 8760 hours. HbA1C: No results for input(s):  HGBA1C in the last 72 hours. CBG: Recent Labs  Lab 02/10/18 1203 02/10/18 1740 02/10/18 2049 02/11/18 0805 02/11/18 1158  GLUCAP 220* 289* 239* 103* 121*   Lipid Profile: No results for input(s): CHOL, HDL, LDLCALC, TRIG, CHOLHDL, LDLDIRECT in the last 72 hours. Thyroid Function Tests: Recent Labs    02/11/18 0356  TSH 6.382*   Anemia Panel: No results for input(s): VITAMINB12, FOLATE, FERRITIN, TIBC, IRON, RETICCTPCT in the last 72 hours. Sepsis Labs: No results for input(s): PROCALCITON, LATICACIDVEN in the last 168 hours.  Recent Results (from the past 240 hour(s))  Gastrointestinal Panel by PCR , Stool     Status: None   Collection Time: 02/08/18 10:36 PM  Result Value Ref Range Status   Campylobacter species NOT DETECTED NOT DETECTED Final   Plesimonas  shigelloides NOT DETECTED NOT DETECTED Final   Salmonella species NOT DETECTED NOT DETECTED Final   Yersinia enterocolitica NOT DETECTED NOT DETECTED Final   Vibrio species NOT DETECTED NOT DETECTED Final   Vibrio cholerae NOT DETECTED NOT DETECTED Final   Enteroaggregative E coli (EAEC) NOT DETECTED NOT DETECTED Final   Enteropathogenic E coli (EPEC) NOT DETECTED NOT DETECTED Final   Enterotoxigenic E coli (ETEC) NOT DETECTED NOT DETECTED Final   Shiga like toxin producing E coli (STEC) NOT DETECTED NOT DETECTED Final   Shigella/Enteroinvasive E coli (EIEC) NOT DETECTED NOT DETECTED Final   Cryptosporidium NOT DETECTED NOT DETECTED Final   Cyclospora cayetanensis NOT DETECTED NOT DETECTED Final   Entamoeba histolytica NOT DETECTED NOT DETECTED Final   Giardia lamblia NOT DETECTED NOT DETECTED Final   Adenovirus F40/41 NOT DETECTED NOT DETECTED Final   Astrovirus NOT DETECTED NOT DETECTED Final   Norovirus GI/GII NOT DETECTED NOT DETECTED Final   Rotavirus A NOT DETECTED NOT DETECTED Final   Sapovirus (I, II, IV, and V) NOT DETECTED NOT DETECTED Final    Comment: Performed at [facility], [address] [place], [place] [address]  C difficile quick scan w PCR reflex     Status: None   Collection Time: 02/10/18  1:14 PM  Result Value Ref Range Status   C Diff antigen NEGATIVE NEGATIVE Final   C Diff toxin NEGATIVE NEGATIVE Final   C Diff interpretation No C. difficile detected.  Final    Comment: Performed at [facility], [place] [address] [place], [place] [address]         Radiology Studies: No results found.      Scheduled Meds: . sodium chloride   Intravenous Once  . acidophilus  1 capsule Oral Daily  . aspirin  81 mg Oral Daily  . enoxaparin (LOVENOX) injection  40 mg Subcutaneous Q24H  . feeding supplement (GLUCERNA SHAKE)  237 mL Oral BID BM  . furosemide  20 mg Oral Daily  . insulin aspart  0-9 Units Subcutaneous TID WC  . insulin detemir  8  Units Subcutaneous QHS  . levothyroxine  25 mcg Oral Q0600  . magnesium oxide  400 mg Oral Daily  . potassium chloride  40 mEq Oral Daily  . povidone-iodine  2 application Topical Once  . pravastatin  10 mg Oral q1800  . psyllium  1 packet Oral Daily  . sodium chloride flush  10-40 mL Intracatheter Q12H  . sodium chloride flush  5 mL Intracatheter Q8H  . sodium chloride flush  5 mL Intracatheter Q8H   Continuous Infusions: . sodium chloride 250 mL (02/08/18 0852)  . ampicillin-sulbactam (UNASYN) IV 3  g (02/11/18 1241)     LOS: 25 days    Time spent: 35 mins.More than 50% of that time was spent in counseling and/or coordination of care.      Shelly Coss, MD [facility] Pager [phone]  If 7PM-7AM, please contact night-coverage [email or web address] Password [email or web address] 02/11/2018, 1:26 PM

## 2018-02-11 NOTE — Progress Notes (Signed)
Referring Physician(s): Dr Tawanna Solo Dr Kathreen Cosier  Supervising Physician: Daryll Brod  Patient Status:  [facility] - In-pt  Chief Complaint:  Rt rectus sheath abscess drain 12/7  Left thigh abscess drain placed 12/11   Subjective:  0-10 cc Op from right rectus sheath drain daily 200-300 cc daily from left thigh abscess  Up in chair Better daily  Nurse found left thigh drain out this am-- not sure if came out while moving pt or inadvertent pulled by pt  MD requesting replacement  Allergies: Patient has no known allergies.  Medications: Prior to Admission medications   Medication Sig Start Date End Date Taking? Authorizing Provider  aspirin EC 81 MG tablet Take 81 mg by mouth daily.   Yes [provider]  LEVEMIR FLEXTOUCH 100 UNIT/ML Pen Inject 30 Units into the skin daily. 01/07/18  Yes Kathie Dike, MD  levothyroxine (SYNTHROID, LEVOTHROID) 25 MCG tablet Take 25 mcg by mouth daily before breakfast.   Yes [provider]  pravastatin (PRAVACHOL) 10 MG tablet Take 10 mg by mouth daily.   Yes [provider]     Vital Signs: BP (!) 153/85 (BP Location: Left Arm)   Pulse (!) 102   Temp 97.9 F (36.6 C) (Oral)   Resp 16   Ht 5\' 8"  (1.727 m)   Wt 160 lb 11.5 oz (72.9 kg)   SpO2 95%   BMI 24.44 kg/m   Physical Exam Vitals signs reviewed.  Pulmonary:     Effort: Pulmonary effort is normal.  Abdominal:     Palpations: Abdomen is soft.     Tenderness: There is no abdominal tenderness.  Musculoskeletal: Normal range of motion.  Skin:    General: Skin is warm and dry.     Comments: Sites of drains are clean and dry NT no bleeding Left thigh drain OUT  Right rectus sheath drain intact  Neurological:     Mental Status: He is alert and oriented to person, place, and time.  Psychiatric:        Mood and Affect: Mood and affect normal.        Behavior: Behavior normal.     Imaging: [place] Ekg Site Rite  Result Date: 02/07/2018 If  Site Rite image not attached, placement could not be confirmed due to current cardiac rhythm.   Labs:  CBC: Recent Labs    02/06/18 0254 02/08/18 0349 02/09/18 0457 02/11/18 0356  WBC 12.7* 9.7 9.2 8.5  HGB 8.2* 7.5* 7.5* 7.6*  HCT 26.0* 25.3* 25.2* 25.0*  PLT 782* 698* 649* 712*    COAGS: Recent Labs    01/03/18 1929 01/17/18 0443 01/25/18 0348  INR 1.20 1.31 1.15  APTT  --  37*  --     BMP: Recent Labs    02/08/18 0349 02/09/18 0457 02/10/18 0342 02/11/18 0356  NA 134* 133* 134* 132*  K 3.2* 3.1* 2.8* 3.0*  CL 96* 96* 94* 95*  CO2 28 27 28 26   GLUCOSE 118* 129* 166* 149*  BUN 10 10 14 10   CALCIUM 7.5* 7.5* 7.5* 7.5*  CREATININE 0.70 0.74 1.02 0.69  GFRNONAA >60 >60 >60 >60  GFRAA >60 >60 >60 >60    LIVER FUNCTION TESTS: Recent Labs    01/05/18 0551 01/17/18 0237 01/30/18 0225 01/31/18 1041  BILITOT 0.3 0.7 0.3 0.8  AST 21 19 38 56*  ALT 19 15 31 31   ALKPHOS 141* 152* 249* 330*  PROT 7.1 7.2 5.7* 6.4*  ALBUMIN 2.6* 2.5*  1.4* 1.6*    Assessment and Plan:  Rt rectus sheath abs drain intact OP minimal for days now Left thigh drain OUT For replacement today  Pt is aware of procedure benefits and risks Agreeable to proceed Consent signed andin chart  Electronically Signed: Lavonia Drafts, PA-C 02/11/2018, 8:45 AM   I spent a total of 25 Minutes at the the patient's bedside AND on the patient's hospital floor or unit, greater than 50% of which was counseling/coordinating care for Rt rectus sheath abs drain and replacement of left thigh drain

## 2018-02-11 NOTE — Progress Notes (Signed)
Patient ID: Anthony Dunlap, male   DOB: 01-May-1933, 84 y.o.   MRN: [id number]   Discussed with Dr Annamaria Boots  Was unable to visualize abscess with [place]  Order for new imaging in place MRI left femur/thigh per Dr Annamaria Boots  Will review imaging when done-- determine plan at that time

## 2018-02-12 LAB — BASIC METABOLIC PANEL
Anion gap: 10 (ref 5–15)
BUN: 10 mg/dL (ref 8–23)
CO2: 27 mmol/L (ref 22–32)
Calcium: 7.6 mg/dL — ABNORMAL LOW (ref 8.9–10.3)
Chloride: 97 mmol/L — ABNORMAL LOW (ref 98–111)
Creatinine, Ser: 0.75 mg/dL (ref 0.61–1.24)
GFR calc Af Amer: >60 mL/min (ref >60)
GFR calc non Af Amer: >60 mL/min (ref >60)
Glucose, Bld: 209 mg/dL — ABNORMAL HIGH (ref 70–99)
Potassium: 3.5 mmol/L (ref 3.5–5.1)
Sodium: 134 mmol/L — ABNORMAL LOW (ref 135–145)

## 2018-02-12 LAB — GLUCOSE, CAPILLARY
GLUCOSE-CAPILLARY: 117 mg/dL — AB (ref 70–99)
Glucose-Capillary: 233 mg/dL — ABNORMAL HIGH (ref 70–99)
Glucose-Capillary: 292 mg/dL — ABNORMAL HIGH (ref 70–99)
Glucose-Capillary: 177 mg/dL — ABNORMAL HIGH (ref 70–99)

## 2018-02-12 LAB — CBC WITH DIFFERENTIAL/PLATELET
Abs Immature Granulocytes: 0.04 10*3/uL (ref 0.00–0.07)
BASOS PCT: 0 %
Basophils Absolute: 0 10*3/uL (ref 0.0–0.1)
Eosinophils Absolute: 0.3 10*3/uL (ref 0.0–0.5)
Eosinophils Relative: 3 %
HCT: 26.5 % — ABNORMAL LOW (ref 39.0–52.0)
Hemoglobin: 7.8 g/dL — ABNORMAL LOW (ref 13.0–17.0)
Immature Granulocytes: 0 %
Lymphocytes Relative: 15 %
Lymphs Abs: 1.4 10*3/uL (ref 0.7–4.0)
MCH: 24 pg — ABNORMAL LOW (ref 26.0–34.0)
MCHC: 29.4 g/dL — ABNORMAL LOW (ref 30.0–36.0)
MCV: 81.5 fL (ref 80.0–100.0)
Monocytes Absolute: 0.8 10*3/uL (ref 0.1–1.0)
Monocytes Relative: 8 %
NRBC: 0 % (ref 0.0–0.2)
Neutro Abs: 6.5 10*3/uL (ref 1.7–7.7)
Neutrophils Relative %: 74 %
Platelets: 767 10*3/uL — ABNORMAL HIGH (ref 150–400)
RBC: 3.25 MIL/uL — ABNORMAL LOW (ref 4.22–5.81)
RDW: 15 % (ref 11.5–15.5)
WBC: 9 10*3/uL (ref 4.0–10.5)

## 2018-02-12 MED ORDER — FERROUS SULFATE 325 (65 FE) MG PO TABS
325.0000 mg | ORAL_TABLET | Freq: Every day | ORAL | Status: DC
  Administered 2018-02-12 – 2018-02-18 (×7): 325 mg via ORAL
  Filled 2018-02-12 (×7): qty 1

## 2018-02-12 MED ORDER — LEVOTHYROXINE SODIUM 50 MCG PO TABS
50.0000 ug | ORAL_TABLET | Freq: Every day | ORAL | Status: DC
  Administered 2018-02-13 – 2018-02-18 (×6): 50 ug via ORAL
  Filled 2018-02-12 (×6): qty 1

## 2018-02-12 NOTE — Progress Notes (Signed)
PROGRESS NOTE    Anthony Dunlap  KDX:[id number] DOB: 09/24/1933 DOA: 01/17/2018 PCP: [name], MD   Brief Narrative: Patient is 84 year old male with past medical history of hypothyroidism, insulin-dependent diabetes, hyperlipidemia, prostate cancer with chronic Foley catheter who presented with sepsis and DKA.  Blood culture grew Peptostreptococcus and Bacteroides. Wound culture grew Enterococcus faecalis and Klebsiella pneumonia.  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  IR consulted and he  is status post drain placement.  Patient was also seen by infectious disease who recommended 8 weeks of IV antibiotics.  DKA has resolved. Hemoglobin dropped  to 6.8. so he was transfused with one unit of PRBC on 02/04/18.   It was found that the drain from the left thigh was pulled out acsidentally on 02/11/18.IR planning to put another drain tomorrow.  Assessment & Plan:   Principal Problem:   Enterococcus faecalis infection Active Problems:   Sepsis secondary to UTI ([place])   Hypothyroidism   DKA (diabetic ketoacidoses) ([name])   Normocytic anemia   Sepsis due to Bacteroides species ([name])   Sepsis without acute organ dysfunction ([name])   Psoas abscess ([name])   Abscess   Pressure injury of skin  Bacteremia/sepsis/pelvis abscesses: Blood cultures grew bacteroids and Peptostreptococcus.  Wound cultures grew enterococcus and Klebsiella pneumonia.  CT abdomen/pelvis on presentation showed osteomyelitis of the pubic symphysis with abscess in the rectus sheath and left thigh.  Status post drains placement.  Rectus sheath abscess-S/P drain placement by Dr. Kathlene Cote 01/25/2018 Thigh abscess-S/P Aspiration and drain placement anterior thigh abscess 01/29/18 by Dr. Laurence Ferrari  IR was  following.  Plan is to continue IV Unasyn for total of 8 weeks. ID was following. Echocardiogram was unrevealing for endocarditis. Leukocytosis  improved . PICC line has been placed.  Last blood cultures have been negative. 02/11/18: IR was planning to repeat CT when thigh drain has about 20 mL or less of output but thigh high drain found to be out accidentally. MRI of the femur showed small new abscess in the vastus medialis muscle on the right side, osseous edema in the pubic bones suggesting osteomyelitis or possible septic pubic symphysis.  Abscess in the left thigh has decreased to 50 cc. IR requested for orthopedics consultation.  I discussed extensively with orthopedics Dr. Rod Can.  He says that he is not a candidate for orthopedic intervention.  Again discussed with Dr. Annamaria Boots today about the orthopedics opinion.  After discussion, we decided to go ahead and plan for the new drain placement tomorrow. I will also request for ID reevaluation tomorrow.  DKA: Resolved.  Insulin drip was stopped.  Transition to Levemir and sliding scale insulin.    Hypothyroidism: Continue Synthyroid  Prostate cancer: Has chronic Foley catheter placed  History of reactive thrombocytosis: On aspirin 81 mg daily.    Hyperlipidemia: Continue statin  Lower extremity edema: Edema just confined to bilateral lower legs .continue PO lasix for now.Decreased it to 20 mg daily. Lower extremity edema improving.  His echocardiogram has shown ejection fraction of 66 5%, grade 1 diastolic dysfunction. We rcommend to elevate the legs.Apply compression stockings.  Hypokalemia/hypomagnesemia: Currently being supplemented.   Anemia:  Most likely associated with his chronic medical problems.  No hematochezia or melena.    Transfused with 1 unit of PRBC on 02/04/18. FOBT  sent and found to be negative.  Jelly like stool: Unknown etiology.  He is eating fine.  His stool was  noticed to be clear jelly like.  Negative GI pathogen panel.  He does not complain of any abdominal pain. Difficile negative.GI consulted,signed off.Improved with psyllium.  Hypothyroidism:  TSH in the range of 6.  Increased the levothyroxine to 50 MCG  Deconditioning/debility: Physical therapy evaluated him and recommends SNF on  Discharge.         DVT prophylaxis: SCD Code Status: Full Family Communication: None present at the bedside Disposition Plan: Skilled nursing facility .Undetermined about the date  Consultants: IR, ID  Procedures: Placement of drains  Antimicrobials: Currently on Unasyn since 12/7  Subjective: Patient seen and examined the bedside this afternoon.  Remains hemodynamically stable.  Denies any specific complaints.  Thigh drain out .  Objective: Vitals:   02/11/18 0540 02/11/18 1208 02/11/18 2206 02/12/18 0555  BP: (!) 153/85 125/76 137/76 (!) 151/83  Pulse: (!) 102 (!) 101 (!) 107 100  Resp: 16 17 16 16   Temp: 97.9 F (36.6 C) 98.3 F (36.8 C) 98.9 F (37.2 C) 98.8 F (37.1 C)  TempSrc: Oral Oral Oral Oral  SpO2: 95% 92% 96% 99%  Weight:      Height:        Intake/Output Summary (Last 24 hours) at 02/12/2018 1114 Last data filed at 02/12/2018 0600 Gross per 24 hour  Intake 967.07 ml  Output 2980 ml  Net -2012.93 ml   Filed Weights   01/17/18 0209 01/17/18 0600 01/23/18 2337  Weight: 70.3 kg 67.1 kg 72.9 kg    Examination:  General exam: Appears calm and comfortable ,Not in distress,average built, generalized weakness HEENT:PERRL,Oral mucosa moist, Ear/Nose normal on gross exam Respiratory system: Bilateral equal air entry, normal vesicular breath sounds, no wheezes or crackles  Cardiovascular system: S1 & S2 heard, RRR. No JVD, murmurs, rubs, gallops or clicks.  Gastrointestinal system: Abdomen is nondistended, soft and nontender. No organomegaly or masses felt. Normal bowel sounds heard.Abdominal drain with scant fluid Central nervous system: Alert and oriented. No focal neurological deficits. Extremities:  lower extremity edema, no clubbing ,no cyanosis, distal peripheral pulses palpable.Drain on the left upper thigh  out.Edema on lower abdominal wall and thighs. Psychiatry: Judgement and insight appear normal. Mood & affect appropriate.     Data Reviewed: I have personally reviewed following labs and imaging studies  CBC: Recent Labs  Lab 02/06/18 0254 02/08/18 0349 02/09/18 0457 02/11/18 0356 02/12/18 0404  WBC 12.7* 9.7 9.2 8.5 9.0  NEUTROABS 9.9* 7.5 7.1 6.1 6.5  HGB 8.2* 7.5* 7.5* 7.6* 7.8*  HCT 26.0* 25.3* 25.2* 25.0* 26.5*  MCV 80.2 81.9 81.6 81.7 81.5  PLT 782* 698* 649* 712* 734*   Basic Metabolic Panel: Recent Labs  Lab 02/06/18 0254  02/08/18 0349 02/09/18 0457 02/10/18 0342 02/11/18 0356 02/12/18 0404  NA 135   < > 134* 133* 134* 132* 134*  K 3.1*   < > 3.2* 3.1* 2.8* 3.0* 3.5  CL 96*   < > 96* 96* 94* 95* 97*  CO2 28   < > 28 27 28 26 27   GLUCOSE 130*   < > 118* 129* 166* 149* 209*  BUN 9   < > 10 10 14 10 10   CREATININE 0.64   < > 0.70 0.74 1.02 0.69 0.75  CALCIUM 7.5*   < > 7.5* 7.5* 7.5* 7.5* 7.6*  MG 1.8  --   --  1.8  --   --   --    < > = values in this interval not displayed.  GFR: Estimated Creatinine Clearance: 68.9 mL/min (by C-G formula based on SCr of 0.75 mg/dL). Liver Function Tests: No results for input(s): AST, ALT, ALKPHOS, BILITOT, PROT, ALBUMIN in the last 168 hours. No results for input(s): LIPASE, AMYLASE in the last 168 hours. No results for input(s): AMMONIA in the last 168 hours. Coagulation Profile: No results for input(s): INR, PROTIME in the last 168 hours. Cardiac Enzymes: No results for input(s): CKTOTAL, CKMB, CKMBINDEX, TROPONINI in the last 168 hours. BNP (last 3 results) No results for input(s): PROBNP in the last 8760 hours. HbA1C: No results for input(s): HGBA1C in the last 72 hours. CBG: Recent Labs  Lab 02/11/18 0805 02/11/18 1158 02/11/18 1728 02/11/18 2151 02/12/18 0739  GLUCAP 103* 121* 119* 146* 117*   Lipid Profile: No results for input(s): CHOL, HDL, LDLCALC, TRIG, CHOLHDL, LDLDIRECT in the last 72  hours. Thyroid Function Tests: Recent Labs    02/11/18 0356  TSH 6.382*   Anemia Panel: No results for input(s): VITAMINB12, FOLATE, FERRITIN, TIBC, IRON, RETICCTPCT in the last 72 hours. Sepsis Labs: No results for input(s): PROCALCITON, LATICACIDVEN in the last 168 hours.  Recent Results (from the past 240 hour(s))  Gastrointestinal Panel by PCR , Stool     Status: None   Collection Time: 02/08/18 10:36 PM  Result Value Ref Range Status   Campylobacter species NOT DETECTED NOT DETECTED Final   Plesimonas shigelloides NOT DETECTED NOT DETECTED Final   Salmonella species NOT DETECTED NOT DETECTED Final   Yersinia enterocolitica NOT DETECTED NOT DETECTED Final   Vibrio species NOT DETECTED NOT DETECTED Final   Vibrio cholerae NOT DETECTED NOT DETECTED Final   Enteroaggregative E coli (EAEC) NOT DETECTED NOT DETECTED Final   Enteropathogenic E coli (EPEC) NOT DETECTED NOT DETECTED Final   Enterotoxigenic E coli (ETEC) NOT DETECTED NOT DETECTED Final   Shiga like toxin producing E coli (STEC) NOT DETECTED NOT DETECTED Final   Shigella/Enteroinvasive E coli (EIEC) NOT DETECTED NOT DETECTED Final   Cryptosporidium NOT DETECTED NOT DETECTED Final   Cyclospora cayetanensis NOT DETECTED NOT DETECTED Final   Entamoeba histolytica NOT DETECTED NOT DETECTED Final   Giardia lamblia NOT DETECTED NOT DETECTED Final   Adenovirus F40/41 NOT DETECTED NOT DETECTED Final   Astrovirus NOT DETECTED NOT DETECTED Final   Norovirus GI/GII NOT DETECTED NOT DETECTED Final   Rotavirus A NOT DETECTED NOT DETECTED Final   Sapovirus (I, II, IV, and V) NOT DETECTED NOT DETECTED Final    Comment: Performed at [facility], [address] [place], [place] [address]  C difficile quick scan w PCR reflex     Status: None   Collection Time: 02/10/18  1:14 PM  Result Value Ref Range Status   C Diff antigen NEGATIVE NEGATIVE Final   C Diff toxin NEGATIVE NEGATIVE Final   C Diff interpretation No C.  difficile detected.  Final    Comment: Performed at [facility], [place] [address] [place],  [address]         Radiology Studies: Mr Femur Left W Wo Contrast  Result Date: 02/11/2018 CLINICAL DATA:  Left thigh abscess/soft tissue mass EXAM: MR OF THE LEFT LOWER EXTREMITY WITHOUT AND WITH CONTRAST TECHNIQUE: Multiplanar, multisequence MR imaging of the left thigh was performed both before and after administration of intravenous contrast. CONTRAST:  7.5 cc Gadavist COMPARISON:  01/28/2018 FINDINGS: Bones/Joint/Cartilage In the contralateral, right thigh, we demonstrate severe subcortical cyst formation along the right femoral head with loss of volume,  and in the right acetabulum. There is some low-level and likely degenerative edema along the right hip. Today's exam was not optimized to assess the right lower extremity. There is abnormal osseous edema and enhancement in the bilateral pubic bones and in the adjacent pubic rami suspicious for osteomyelitis and septic pubic symphysis. There is a small knee effusion with only thin rim synovial enhancement, less than would be expected in the setting of a septic joint. There is artifact extending through the patella and distal femur on some images such as on series 24. Considerable degenerative arthropathy of both knees. Ligaments N/A Muscles and Tendons The anterior compartmental abscess between the vastus intermedius and rectus femoris measures 1.6 by 3.8 by 15.6 cm (volume = 50 cm^3), this previously had a volume of 110 cubic cm. Abscess tracking along the right hip adductor musculature has a complex morphology that is difficult to measure, but appears moderately reduced in volume compared to 01/28/2018. Lateral trochanteric bursal collection with mild enhancement is likewise moderately reduced but not resolved. Along the medial margin of the right gluteus maximus muscle there is a 2.6 by 0.8 by 5.0 cm (volume = 5 cm^3) abscess which may have been  present previously but is better seen today due to reduced boundary affect. Phlegmon tracking along the margins of the posterior compartment of the thigh appears mildly improved. There continues to be diffuse subcutaneous edema in the calf suggesting cellulitis, mildly improved from prior. A small abscess in the vastus medialis on image 38/22 measures 2.4 by 1.9 by 1.2 cm (volume = 2.9 cm^3) and was not readily apparent previously. Abnormal appearance of the right rectus abdominus, there is paid previously a abscess with a drain in this vicinity, this may still be in place. Soft tissues Subcutaneous edema in the left thigh is probably from cellulitis. Mildly improved from prior. IMPRESSION: 1. Various abscesses in the left thigh are mildly improved in volume compared to the prior exam. This includes the abscesses of the anterior compartment and adductor musculature. For example, a dominant anterior compartmental abscess primarily between the vastus intermedius and rectus femoris is currently 50 cubic cm, previously 110 cubic cm. 2. There may be a new small (2.9 cubic cm) abscess in the vastus medialis muscle on the right. There is abnormal osseous edema in the pubic bones along with edema in the pubic symphysis and pubic rami, suspicious for osteomyelitis and septic pubic symphysis. 3. Fluid in the right rectus abdominus muscle likely representing drained abscess, correlate with patient history. 4. Edema and enhancement in the subcutaneous tissues of the left thigh, mildly improved from prior, probably from cellulitis. 5. Severe degenerative arthropathy of the right hip with confluent subcortical cyst formation, subcortical marrow edema, and volume loss in the right femoral head. Electronically Signed   By: Van Clines M.D.   On: 02/11/2018 17:20        Scheduled Meds: . sodium chloride   Intravenous Once  . acidophilus  1 capsule Oral Daily  . aspirin  81 mg Oral Daily  . enoxaparin (LOVENOX)  injection  40 mg Subcutaneous Q24H  . feeding supplement (GLUCERNA SHAKE)  237 mL Oral BID BM  . ferrous sulfate  325 mg Oral Q breakfast  . furosemide  20 mg Oral Daily  . insulin aspart  0-9 Units Subcutaneous TID WC  . insulin detemir  8 Units Subcutaneous QHS  . [START ON 02/13/2018] levothyroxine  50 mcg Oral Q0600  . magnesium oxide  400 mg Oral Daily  .  potassium chloride  40 mEq Oral Daily  . povidone-iodine  2 application Topical Once  . pravastatin  10 mg Oral q1800  . psyllium  1 packet Oral Daily  . sodium chloride flush  10-40 mL Intracatheter Q12H  . sodium chloride flush  5 mL Intracatheter Q8H  . sodium chloride flush  5 mL Intracatheter Q8H   Continuous Infusions: . sodium chloride 250 mL (02/08/18 0852)  . ampicillin-sulbactam (UNASYN) IV 3 g (02/12/18 0537)     LOS: 26 days    Time spent: 35 mins.More than 50% of that time was spent in counseling and/or coordination of care.      Shelly Coss, MD [facility] Pager [phone]  If 7PM-7AM, please contact night-coverage [email or web address] Password [email or web address] 02/12/2018, 11:14 AM

## 2018-02-12 NOTE — Plan of Care (Signed)
  Problem: Nutrition: Goal: Adequate nutrition will be maintained Outcome: Progressing   Problem: Coping: Goal: Level of anxiety will decrease Outcome: Progressing   

## 2018-02-12 NOTE — Progress Notes (Signed)
Aware of IR request for possible image-guided left thigh drain replacement.  MRI images reviewed by Dr. Annamaria Boots. Based on these images, it appears that patient is failing percutaneous drain management- the larger left thigh abscess (where drain fell out of) has not subsided with drain placement and there are multiple small pockets in the left proximal thigh that are too small to access with drains. Because of this, procedure not indicated at this time. Per Dr. Annamaria Boots, recommend ID and ortho involvement for possible I&D.  IR available in future if needed. Please call IR with questions/concerns.  Bea Graff Madox Corkins, PA-C 02/12/2018, 9:45 AM

## 2018-02-13 DIAGNOSIS — R509 Fever, unspecified: Secondary | ICD-10-CM

## 2018-02-13 DIAGNOSIS — D72829 Elevated white blood cell count, unspecified: Secondary | ICD-10-CM

## 2018-02-13 LAB — GLUCOSE, CAPILLARY
GLUCOSE-CAPILLARY: 104 mg/dL — AB (ref 70–99)
GLUCOSE-CAPILLARY: 275 mg/dL — AB (ref 70–99)
Glucose-Capillary: 81 mg/dL (ref 70–99)
Glucose-Capillary: 151 mg/dL — ABNORMAL HIGH (ref 70–99)

## 2018-02-13 MED ORDER — ENOXAPARIN SODIUM 40 MG/0.4ML ~~LOC~~ SOLN: 40.0000 mg | Freq: Every day | SUBCUTANEOUS | Status: DC

## 2018-02-13 NOTE — Progress Notes (Signed)
Inpatient Diabetes Program Recommendations  AACE/ADA: New Consensus Statement on Inpatient Glycemic Control (2015)  Target Ranges:  Prepandial:   less than 140 mg/dL      Peak postprandial:   less than 180 mg/dL (1-2 hours)      Critically ill patients:  140 - 180 mg/dL   Lab Results  Component Value Date   GLUCAP 81 02/13/2018   HGBA1C 9.6 (H) 01/04/2018    Review of Glycemic Control Results for [name], Anthony Dunlap (MRN [id number]) as of 02/13/2018 11:33  Ref. Range 02/12/2018 07:39 02/12/2018 12:06 02/12/2018 17:28 02/12/2018 21:20 02/13/2018 08:47  Glucose-Capillary Latest Ref Range: 70 - 99 mg/dL 117 (H) 233 (H) 292 (H) 177 (H) 81   Diabetes history: DM 2  Inpatient Diabetes Program Recommendations: Please consider adding Novolog meal coverage 2 units tid with meals (hold if patient eats less than 50%).   Thanks,  Adah Perl, RN, BC-ADM Inpatient Diabetes Coordinator Pager [phone] (8a-5p)

## 2018-02-13 NOTE — Progress Notes (Signed)
[facility] for Infectious Disease   Reason for visit: Follow up on abscess  Interval History: called back due to persistent infection and inadequate drainage.  Repeat MRI noted and some improvement on the left side, new area in the medialis muscle on the right, osteomyelitis and septic pubic symphysis. Day 28 total antibiotics Amp/sulbactam day 20   Physical Exam: Constitutional:  Vitals:   02/12/18 2123 02/13/18 0558  BP: (!) 150/89 139/89  Pulse: (!) 102 (!) 106  Resp: 18 17  Temp: 98.3 F (36.8 C) 97.9 F (36.6 C)  SpO2: 99% 98%   patient appears in NAD Eyes: anicteric HENT: no thrush Respiratory: Normal respiratory effort; CTA B Cardiovascular: RRR GI: soft, nt, nd  Review of Systems: Constitutional: negative for fevers and chills Gastrointestinal: negative for nausea and diarrhea Integument/breast: negative for rash Musculoskeletal: negative for myalgias and arthralgias  Lab Results  Component Value Date   WBC 9.0 02/12/2018   HGB 7.8 (L) 02/12/2018   HCT 26.5 (L) 02/12/2018   MCV 81.5 02/12/2018   PLT 767 (H) 02/12/2018    Lab Results  Component Value Date   CREATININE 0.75 02/12/2018   BUN 10 02/12/2018   NA 134 (L) 02/12/2018   K 3.5 02/12/2018   CL 97 (L) 02/12/2018   CO2 27 02/12/2018    Lab Results  Component Value Date   ALT 31 01/31/2018   AST 56 (H) 01/31/2018   ALKPHOS 330 (H) 01/31/2018     Microbiology: Recent Results (from the past 240 hour(s))  Gastrointestinal Panel by PCR , Stool     Status: None   Collection Time: 02/08/18 10:36 PM  Result Value Ref Range Status   Campylobacter species NOT DETECTED NOT DETECTED Final   Plesimonas shigelloides NOT DETECTED NOT DETECTED Final   Salmonella species NOT DETECTED NOT DETECTED Final   Yersinia enterocolitica NOT DETECTED NOT DETECTED Final   Vibrio species NOT DETECTED NOT DETECTED Final   Vibrio cholerae NOT DETECTED NOT DETECTED Final   Enteroaggregative E coli (EAEC) NOT  DETECTED NOT DETECTED Final   Enteropathogenic E coli (EPEC) NOT DETECTED NOT DETECTED Final   Enterotoxigenic E coli (ETEC) NOT DETECTED NOT DETECTED Final   Shiga like toxin producing E coli (STEC) NOT DETECTED NOT DETECTED Final   Shigella/Enteroinvasive E coli (EIEC) NOT DETECTED NOT DETECTED Final   Cryptosporidium NOT DETECTED NOT DETECTED Final   Cyclospora cayetanensis NOT DETECTED NOT DETECTED Final   Entamoeba histolytica NOT DETECTED NOT DETECTED Final   Giardia lamblia NOT DETECTED NOT DETECTED Final   Adenovirus F40/41 NOT DETECTED NOT DETECTED Final   Astrovirus NOT DETECTED NOT DETECTED Final   Norovirus GI/GII NOT DETECTED NOT DETECTED Final   Rotavirus A NOT DETECTED NOT DETECTED Final   Sapovirus (I, II, IV, and V) NOT DETECTED NOT DETECTED Final    Comment: Performed at [facility], [address] [place], [place] [address]  C difficile quick scan w PCR reflex     Status: None   Collection Time: 02/10/18  1:14 PM  Result Value Ref Range Status   C Diff antigen NEGATIVE NEGATIVE Final   C Diff toxin NEGATIVE NEGATIVE Final   C Diff interpretation No C. difficile detected.  Final    Comment: Performed at [facility], [place] [address], [place], [place] [address]    Impression/Plan:  1. multiorganism abscess with pelvic osteomyelitis.  On appropriate therapy with amp/sulbactam.  No changes indicated.  Need source control  and IR to place a new drain.  Consider surgery consultation if not drained adequately.   Treatment for 4 more weeks  2.  Leukocytosis - from #1.  Need to assure source control.  3.  Fever - from #1 and anticipate periodic fever until abscess drained.    Dr. Baxter Flattery on tomorrow and will follow up intermittently

## 2018-02-13 NOTE — Progress Notes (Signed)
Patient ID: CALI CUARTAS, male   DOB: 08-Jun-1933, 84 y.o.   MRN: [id number]   Left thigh drain catheter accidentally pulled out 12/24 New imaging reveals: IMPRESSION: 1. Various abscesses in the left thigh are mildly improved in volume compared to the prior exam. This includes the abscesses of the anterior compartment and adductor musculature. For example, a dominant anterior compartmental abscess primarily between the vastus intermedius and rectus femoris is currently 50 cubic cm, previously 110 cubic cm. 2. There may be a new small (2.9 cubic cm) abscess in the vastus medialis muscle on the right. There is abnormal osseous edema in the pubic bones along with edema in the pubic symphysis and pubic rami, suspicious for osteomyelitis and septic pubic symphysis. 3. Fluid in the right rectus abdominus muscle likely representing drained abscess, correlate with patient history. 4. Edema and enhancement in the subcutaneous tissues of the left thigh, mildly improved from prior, probably from cellulitis. 5. Severe degenerative arthropathy of the right hip with confluent subcortical cyst formation, subcortical marrow edema, and volume loss in the right femoral head.  Dr Annamaria Boots discussed with MD. Plan is to MOVE AHEAD with new drain placement Fri 12/27 (HOLD Lovenox now)  Pt is aware Consent signed andin chart

## 2018-02-13 NOTE — Social Work (Signed)
CSW continuing to follow for support with disposition when medically appropriate.  Ludmila Ebarb, MSW, LCSWA [place] Clinical Social Work [phone]   

## 2018-02-13 NOTE — Progress Notes (Signed)
Physical Therapy Treatment Patient Details Name: Anthony Dunlap MRN: [id number] DOB: 04/30/1933 Today's Date: 02/13/2018    History of Present Illness 84 y.o. male with medical history significant for hypothyroidism, insulin-dependent diabetes mellitus, hyperlipidemia, and history of prostate cancer with chronic Foley catheter, now presenting to the emergency department for evaluation of rigors.  Patient reports that he was in his usual state of health until overnight when he began sweating and developed shaking chills.  In the ED, pt was febrile with HR in 140s.  He also had serum glucose of 440 with anion gap of 12.  Pt admitted with sepsis and CT revealed muliple intramuscular abscesses of the abdomen and pelvis.  Pt is s/p drainage placement on 12/7 and drainage placement on 12/11.     PT Comments    Patient seen for mobility progression. This session focused on functional transfers. Stedy standing frame utilized for increased safety with OOB mobility. Continue to progress as tolerated with anticipated d/c to SNF for further skilled PT services.     Follow Up Recommendations  SNF;Supervision/Assistance - 24 hour     Equipment Recommendations  None recommended by PT    Recommendations for Other Services OT consult     Precautions / Restrictions Precautions Precautions: Fall Precaution Comments: 1 jackson prat drain in place     Mobility  Bed Mobility Overal bed mobility: Needs Assistance Bed Mobility: Supine to Sit     Supine to sit: Min assist     General bed mobility comments: assist to scoot hips to EOB and elevate trunk into sitting; use of rail  Transfers Overall transfer level: Needs assistance   Transfers: Sit to/from Stand Sit to Stand: +2 physical assistance;Mod assist;Min assist         General transfer comment: pt stood X4 with use of Stedy standing frame; assist to power up into standing; mutlimodal cues for hip extension and vc for forward gaze    Ambulation/Gait             General Gait Details: deferred for pt safety   Stairs             Wheelchair Mobility    Modified Rankin (Stroke Patients Only)       Balance Overall balance assessment: Needs assistance Sitting-balance support: Bilateral upper extremity supported Sitting balance-Leahy Scale: Poor Sitting balance - Comments: assistance required to maintain sitting balance EOB then min guard/supervision when holding onto Stedy standing frame Postural control: Posterior lean Standing balance support: During functional activity;Bilateral upper extremity supported Standing balance-Leahy Scale: Poor Standing balance comment: worked on standing balance, weight shifting, and activity tolerance with use of standing frame; limited due to incontinence of stool upon last standing trial                            Cognition Arousal/Alertness: Awake/alert Behavior During Therapy: WFL for tasks assessed/performed Overall Cognitive Status: Within Functional Limits for tasks assessed                                        Exercises      General Comments General comments (skin integrity, edema, etc.): incontinence of stool with last standing trial      Pertinent Vitals/Pain Pain Assessment: Faces Faces Pain Scale: Hurts little more Pain Location: pelvis when sitting EOB Pain Descriptors / Indicators: Grimacing;Guarding Pain Intervention(s):  Limited activity within patient's tolerance;Monitored during session;Repositioned    Home Living                      Prior Function            PT Goals (current goals can now be found in the care plan section) Progress towards PT goals: Progressing toward goals    Frequency    Min 3X/week      PT Plan Current plan remains appropriate    Co-evaluation              AM-PAC PT "6 Clicks" Mobility   Outcome Measure  Help needed turning from your back to your side  while in a flat bed without using bedrails?: A Lot Help needed moving from lying on your back to sitting on the side of a flat bed without using bedrails?: A Lot Help needed moving to and from a bed to a chair (including a wheelchair)?: Total Help needed standing up from a chair using your arms (e.g., wheelchair or bedside chair)?: A Lot Help needed to walk in hospital room?: Total Help needed climbing 3-5 steps with a railing? : Total 6 Click Score: 9    End of Session Equipment Utilized During Treatment: Gait belt Activity Tolerance: Patient tolerated treatment well Patient left: in chair;with call bell/phone within reach;with chair alarm set Nurse Communication: Mobility status PT Visit Diagnosis: Unsteadiness on feet (R26.81);Other abnormalities of gait and mobility (R26.89);Muscle weakness (generalized) (M62.81)     Time: 7622-6333 PT Time Calculation (min) (ACUTE ONLY): 27 min  Charges:  $Therapeutic Activity: 23-37 mins                     Earney Navy, PTA Acute Rehabilitation Services Pager: [phone] Office: [phone]     Darliss Cheney 02/13/2018, 5:02 PM

## 2018-02-13 NOTE — Progress Notes (Addendum)
PROGRESS NOTE    Anthony Dunlap  IFO:[id number] DOB: 1933/04/22 DOA: 01/17/2018 PCP: [name], MD   Brief Narrative: Patient is 84 year old male with past medical history of hypothyroidism, insulin-dependent diabetes, hyperlipidemia, prostate cancer with chronic Foley catheter who presented with sepsis and DKA.  Blood culture grew Peptostreptococcus and Bacteroides. Wound culture grew Enterococcus faecalis and Klebsiella pneumonia.  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  IR consulted and he  is status post drain placement.  Patient was also seen by infectious disease who recommended 8 weeks of IV antibiotics.  DKA has resolved. Hemoglobin dropped  to 6.8. so he was transfused with one unit of PRBC on 02/04/18.   It was found that the drain from the left thigh was pulled out accidentally on 02/11/18.IR planning to put another drain tomorrow.  Assessment & Plan:   Principal Problem:   Enterococcus faecalis infection Active Problems:   Sepsis secondary to UTI ([place])   Hypothyroidism   DKA (diabetic ketoacidoses) ([name])   Normocytic anemia   Sepsis due to Bacteroides species ([name])   Sepsis without acute organ dysfunction ([name])   Psoas abscess ([name])   Abscess   Pressure injury of skin  Bacteremia/sepsis/pelvis abscesses: Blood cultures grew bacteroids and Peptostreptococcus.  Wound cultures grew enterococcus and Klebsiella pneumonia.  CT abdomen/pelvis on presentation showed osteomyelitis of the pubic symphysis with abscess in the rectus sheath and left thigh.  Status post drains placement.  Rectus sheath abscess-S/P drain placement by Dr. Kathlene Cote 01/25/2018 Thigh abscess-S/P Aspiration and drain placement anterior thigh abscess 01/29/18 by Dr. Laurence Ferrari  IR was  following.  Plan is to continue IV Unasyn for total of 8 weeks. ID was following. Echocardiogram was unrevealing for endocarditis. Leukocytosis  improved . PICC line has been placed.  Last blood cultures have been negative. 02/11/18: IR was planning to repeat CT when thigh drain has about 20 mL or less of output but thigh high drain found to be out accidentally. MRI of the femur showed small new abscess in the vastus medialis muscle on the right side, osseous edema in the pubic bones suggesting osteomyelitis or possible septic pubic symphysis.  Abscess in the left thigh has decreased to 50 cc. IR requested for orthopedics consultation.  I discussed extensively with orthopedics Dr. Rod Can.  He says that he is not a candidate for orthopedic intervention.  Again discussed with IR and we decided to go ahead and plan for the new drain placement tomorrow. I will also request for ID reevaluation today.  DKA: Resolved.  Insulin drip was stopped.  Transition to Levemir and sliding scale insulin.    Hypothyroidism: Continue Synthyroid  Prostate cancer: Has chronic Foley catheter placed  History of reactive thrombocytosis: On aspirin 81 mg daily.    Hyperlipidemia: Continue statin  Lower extremity edema: Edema just confined to bilateral lower legs .continue PO lasix for now.Decreased it to 20 mg daily. Lower extremity edema improving.  His echocardiogram has shown ejection fraction of 66 5%, grade 1 diastolic dysfunction. We rcommend to elevate the legs.Apply compression stockings.  Hypokalemia/hypomagnesemia: Currently being supplemented.   Anemia:  Most likely associated with his chronic medical problems.  No hematochezia or melena.    Transfused with 1 unit of PRBC on 02/04/18. FOBT  sent and found to be negative.  Jelly like stool: Unknown etiology.  He is eating fine.  His stool was noticed to be clear jelly like.  Negative  GI pathogen panel.  He does not complain of any abdominal pain. Difficile negative.GI consulted,signed off.Improved with psyllium.  Hypothyroidism: TSH in the range of 6.  Increased the levothyroxine to 50  MCG  Deconditioning/debility: Physical therapy evaluated him and recommends SNF on  Discharge.         DVT prophylaxis: SCD Code Status: Full Family Communication: None present at the bedside Disposition Plan: Skilled nursing facility .Undetermined about the date  Consultants: IR, ID  Procedures: Placement of drains  Antimicrobials: Currently on Unasyn since 12/7  Subjective: Patient seen and examined the bedside this afternoon.  Remains hemodynamically stable.  Denies any specific complaints.  Thigh drain out .  Objective: Vitals:   02/12/18 0555 02/12/18 1401 02/12/18 2123 02/13/18 0558  BP: (!) 151/83 127/74 (!) 150/89 139/89  Pulse: 100 (!) 102 (!) 102 (!) 106  Resp: 16 18 18 17   Temp: 98.8 F (37.1 C) 98.5 F (36.9 C) 98.3 F (36.8 C) 97.9 F (36.6 C)  TempSrc: Oral Oral Oral Oral  SpO2: 99% 100% 99% 98%  Weight:      Height:        Intake/Output Summary (Last 24 hours) at 02/13/2018 1053 Last data filed at 02/13/2018 0559 Gross per 24 hour  Intake 467 ml  Output 1710 ml  Net -1243 ml   Filed Weights   01/17/18 0209 01/17/18 0600 01/23/18 2337  Weight: 70.3 kg 67.1 kg 72.9 kg    Examination:  General exam: Appears calm and comfortable ,Not in distress,average built, generalized weakness HEENT:PERRL,Oral mucosa moist, Ear/Nose normal on gross exam Respiratory system: Bilateral equal air entry, normal vesicular breath sounds, no wheezes or crackles  Cardiovascular system: S1 & S2 heard, RRR. No JVD, murmurs, rubs, gallops or clicks.  Gastrointestinal system: Abdomen is nondistended, soft and nontender. No organomegaly or masses felt. Normal bowel sounds heard.Abdominal drain with scant fluid Central nervous system: Alert and oriented. No focal neurological deficits. Extremities:  lower extremity edema, no clubbing ,no cyanosis, distal peripheral pulses palpable.Drain on the left upper thigh out.Edema on lower abdominal wall and thighs. Psychiatry:  Judgement and insight appear normal. Mood & affect appropriate.     Data Reviewed: I have personally reviewed following labs and imaging studies  CBC: Recent Labs  Lab 02/08/18 0349 02/09/18 0457 02/11/18 0356 02/12/18 0404  WBC 9.7 9.2 8.5 9.0  NEUTROABS 7.5 7.1 6.1 6.5  HGB 7.5* 7.5* 7.6* 7.8*  HCT 25.3* 25.2* 25.0* 26.5*  MCV 81.9 81.6 81.7 81.5  PLT 698* 649* 712* 229*   Basic Metabolic Panel: Recent Labs  Lab 02/08/18 0349 02/09/18 0457 02/10/18 0342 02/11/18 0356 02/12/18 0404  NA 134* 133* 134* 132* 134*  K 3.2* 3.1* 2.8* 3.0* 3.5  CL 96* 96* 94* 95* 97*  CO2 28 27 28 26 27   GLUCOSE 118* 129* 166* 149* 209*  BUN 10 10 14 10 10   CREATININE 0.70 0.74 1.02 0.69 0.75  CALCIUM 7.5* 7.5* 7.5* 7.5* 7.6*  MG  --  1.8  --   --   --    GFR: Estimated Creatinine Clearance: 68.9 mL/min (by C-G formula based on SCr of 0.75 mg/dL). Liver Function Tests: No results for input(s): AST, ALT, ALKPHOS, BILITOT, PROT, ALBUMIN in the last 168 hours. No results for input(s): LIPASE, AMYLASE in the last 168 hours. No results for input(s): AMMONIA in the last 168 hours. Coagulation Profile: No results for input(s): INR, PROTIME in the last 168 hours. Cardiac Enzymes: No results for input(s): CKTOTAL,  CKMB, CKMBINDEX, TROPONINI in the last 168 hours. BNP (last 3 results) No results for input(s): PROBNP in the last 8760 hours. HbA1C: No results for input(s): HGBA1C in the last 72 hours. CBG: Recent Labs  Lab 02/12/18 0739 02/12/18 1206 02/12/18 1728 02/12/18 2120 02/13/18 0847  GLUCAP 117* 233* 292* 177* 81   Lipid Profile: No results for input(s): CHOL, HDL, LDLCALC, TRIG, CHOLHDL, LDLDIRECT in the last 72 hours. Thyroid Function Tests: Recent Labs    02/11/18 0356  TSH 6.382*   Anemia Panel: No results for input(s): VITAMINB12, FOLATE, FERRITIN, TIBC, IRON, RETICCTPCT in the last 72 hours. Sepsis Labs: No results for input(s): PROCALCITON, LATICACIDVEN in the  last 168 hours.  Recent Results (from the past 240 hour(s))  Gastrointestinal Panel by PCR , Stool     Status: None   Collection Time: 02/08/18 10:36 PM  Result Value Ref Range Status   Campylobacter species NOT DETECTED NOT DETECTED Final   Plesimonas shigelloides NOT DETECTED NOT DETECTED Final   Salmonella species NOT DETECTED NOT DETECTED Final   Yersinia enterocolitica NOT DETECTED NOT DETECTED Final   Vibrio species NOT DETECTED NOT DETECTED Final   Vibrio cholerae NOT DETECTED NOT DETECTED Final   Enteroaggregative E coli (EAEC) NOT DETECTED NOT DETECTED Final   Enteropathogenic E coli (EPEC) NOT DETECTED NOT DETECTED Final   Enterotoxigenic E coli (ETEC) NOT DETECTED NOT DETECTED Final   Shiga like toxin producing E coli (STEC) NOT DETECTED NOT DETECTED Final   Shigella/Enteroinvasive E coli (EIEC) NOT DETECTED NOT DETECTED Final   Cryptosporidium NOT DETECTED NOT DETECTED Final   Cyclospora cayetanensis NOT DETECTED NOT DETECTED Final   Entamoeba histolytica NOT DETECTED NOT DETECTED Final   Giardia lamblia NOT DETECTED NOT DETECTED Final   Adenovirus F40/41 NOT DETECTED NOT DETECTED Final   Astrovirus NOT DETECTED NOT DETECTED Final   Norovirus GI/GII NOT DETECTED NOT DETECTED Final   Rotavirus A NOT DETECTED NOT DETECTED Final   Sapovirus (I, II, IV, and V) NOT DETECTED NOT DETECTED Final    Comment: Performed at [facility], [address] [place], [place] [address]  C difficile quick scan w PCR reflex     Status: None   Collection Time: 02/10/18  1:14 PM  Result Value Ref Range Status   C Diff antigen NEGATIVE NEGATIVE Final   C Diff toxin NEGATIVE NEGATIVE Final   C Diff interpretation No C. difficile detected.  Final    Comment: Performed at [facility], [address] [place], [place] [address]         Radiology Studies: Mr Femur Left W Wo Contrast  Result Date: 02/11/2018 CLINICAL DATA:  Left thigh abscess/soft tissue mass EXAM: MR OF  THE LEFT LOWER EXTREMITY WITHOUT AND WITH CONTRAST TECHNIQUE: Multiplanar, multisequence MR imaging of the left thigh was performed both before and after administration of intravenous contrast. CONTRAST:  7.5 cc Gadavist COMPARISON:  01/28/2018 FINDINGS: Bones/Joint/Cartilage In the contralateral, right thigh, we demonstrate severe subcortical cyst formation along the right femoral head with loss of volume, and in the right acetabulum. There is some low-level and likely degenerative edema along the right hip. Today's exam was not optimized to assess the right lower extremity. There is abnormal osseous edema and enhancement in the bilateral pubic bones and in the adjacent pubic rami suspicious for osteomyelitis and septic pubic symphysis. There is a small knee effusion with only thin rim synovial enhancement, less than would be expected in the setting of  a septic joint. There is artifact extending through the patella and distal femur on some images such as on series 24. Considerable degenerative arthropathy of both knees. Ligaments N/A Muscles and Tendons The anterior compartmental abscess between the vastus intermedius and rectus femoris measures 1.6 by 3.8 by 15.6 cm (volume = 50 cm^3), this previously had a volume of 110 cubic cm. Abscess tracking along the right hip adductor musculature has a complex morphology that is difficult to measure, but appears moderately reduced in volume compared to 01/28/2018. Lateral trochanteric bursal collection with mild enhancement is likewise moderately reduced but not resolved. Along the medial margin of the right gluteus maximus muscle there is a 2.6 by 0.8 by 5.0 cm (volume = 5 cm^3) abscess which may have been present previously but is better seen today due to reduced boundary affect. Phlegmon tracking along the margins of the posterior compartment of the thigh appears mildly improved. There continues to be diffuse subcutaneous edema in the calf suggesting cellulitis, mildly  improved from prior. A small abscess in the vastus medialis on image 38/22 measures 2.4 by 1.9 by 1.2 cm (volume = 2.9 cm^3) and was not readily apparent previously. Abnormal appearance of the right rectus abdominus, there is paid previously a abscess with a drain in this vicinity, this may still be in place. Soft tissues Subcutaneous edema in the left thigh is probably from cellulitis. Mildly improved from prior. IMPRESSION: 1. Various abscesses in the left thigh are mildly improved in volume compared to the prior exam. This includes the abscesses of the anterior compartment and adductor musculature. For example, a dominant anterior compartmental abscess primarily between the vastus intermedius and rectus femoris is currently 50 cubic cm, previously 110 cubic cm. 2. There may be a new small (2.9 cubic cm) abscess in the vastus medialis muscle on the right. There is abnormal osseous edema in the pubic bones along with edema in the pubic symphysis and pubic rami, suspicious for osteomyelitis and septic pubic symphysis. 3. Fluid in the right rectus abdominus muscle likely representing drained abscess, correlate with patient history. 4. Edema and enhancement in the subcutaneous tissues of the left thigh, mildly improved from prior, probably from cellulitis. 5. Severe degenerative arthropathy of the right hip with confluent subcortical cyst formation, subcortical marrow edema, and volume loss in the right femoral head. Electronically Signed   By: Van Clines M.D.   On: 02/11/2018 17:20        Scheduled Meds: . sodium chloride   Intravenous Once  . acidophilus  1 capsule Oral Daily  . aspirin  81 mg Oral Daily  . feeding supplement (GLUCERNA SHAKE)  237 mL Oral BID BM  . ferrous sulfate  325 mg Oral Q breakfast  . furosemide  20 mg Oral Daily  . insulin aspart  0-9 Units Subcutaneous TID WC  . insulin detemir  8 Units Subcutaneous QHS  . levothyroxine  50 mcg Oral Q0600  . magnesium oxide  400 mg  Oral Daily  . potassium chloride  40 mEq Oral Daily  . povidone-iodine  2 application Topical Once  . pravastatin  10 mg Oral q1800  . psyllium  1 packet Oral Daily  . sodium chloride flush  10-40 mL Intracatheter Q12H  . sodium chloride flush  5 mL Intracatheter Q8H  . sodium chloride flush  5 mL Intracatheter Q8H   Continuous Infusions: . sodium chloride 250 mL (02/08/18 0852)  . ampicillin-sulbactam (UNASYN) IV 3 g (02/13/18 0513)  LOS: 27 days    Time spent: 35 mins.More than 50% of that time was spent in counseling and/or coordination of care.      Shelly Coss, MD [facility] Pager [phone]  If 7PM-7AM, please contact night-coverage [email or web address] Password [facility] 02/13/2018, 10:53 AM

## 2018-02-14 ENCOUNTER — Inpatient Hospital Stay (HOSPITAL_COMMUNITY): Payer: Medicare Other

## 2018-02-14 LAB — GLUCOSE, CAPILLARY
GLUCOSE-CAPILLARY: 144 mg/dL — AB (ref 70–99)
GLUCOSE-CAPILLARY: 140 mg/dL — AB (ref 70–99)
Glucose-Capillary: 225 mg/dL — ABNORMAL HIGH (ref 70–99)
Glucose-Capillary: 225 mg/dL — ABNORMAL HIGH (ref 70–99)

## 2018-02-14 LAB — BASIC METABOLIC PANEL
Anion gap: 9 (ref 5–15)
BUN: 8 mg/dL (ref 8–23)
CO2: 25 mmol/L (ref 22–32)
Calcium: 7.8 mg/dL — ABNORMAL LOW (ref 8.9–10.3)
Chloride: 98 mmol/L (ref 98–111)
Creatinine, Ser: 0.63 mg/dL (ref 0.61–1.24)
GFR calc Af Amer: >60 mL/min (ref >60)
GFR calc non Af Amer: >60 mL/min (ref >60)
Glucose, Bld: 223 mg/dL — ABNORMAL HIGH (ref 70–99)
Potassium: 3.1 mmol/L — ABNORMAL LOW (ref 3.5–5.1)
Sodium: 132 mmol/L — ABNORMAL LOW (ref 135–145)

## 2018-02-14 LAB — CBC WITH DIFFERENTIAL/PLATELET
ABS IMMATURE GRANULOCYTES: 0.08 10*3/uL — AB (ref 0.00–0.07)
Basophils Absolute: 0 10*3/uL (ref 0.0–0.1)
Basophils Relative: 0 %
Eosinophils Absolute: 0.1 10*3/uL (ref 0.0–0.5)
Eosinophils Relative: 1 %
HCT: 25.5 % — ABNORMAL LOW (ref 39.0–52.0)
Hemoglobin: 7.6 g/dL — ABNORMAL LOW (ref 13.0–17.0)
Immature Granulocytes: 1 %
Lymphocytes Relative: 12 %
Lymphs Abs: 1.4 10*3/uL (ref 0.7–4.0)
MCH: 24.1 pg — ABNORMAL LOW (ref 26.0–34.0)
MCHC: 29.8 g/dL — ABNORMAL LOW (ref 30.0–36.0)
MCV: 81 fL (ref 80.0–100.0)
Monocytes Absolute: 0.9 10*3/uL (ref 0.1–1.0)
Monocytes Relative: 8 %
Neutro Abs: 8.6 10*3/uL — ABNORMAL HIGH (ref 1.7–7.7)
Neutrophils Relative %: 78 %
Platelets: 789 10*3/uL — ABNORMAL HIGH (ref 150–400)
RBC: 3.15 MIL/uL — ABNORMAL LOW (ref 4.22–5.81)
RDW: 15.1 % (ref 11.5–15.5)
WBC: 11.1 10*3/uL — ABNORMAL HIGH (ref 4.0–10.5)
nRBC: 0 % (ref 0.0–0.2)

## 2018-02-14 LAB — PROTIME-INR
INR: 1.23
Prothrombin Time: 15.4 seconds — ABNORMAL HIGH (ref 11.4–15.2)

## 2018-02-14 MED ORDER — POTASSIUM CHLORIDE 10 MEQ/100ML IV SOLN
10.0000 meq | INTRAVENOUS | Status: AC
  Administered 2018-02-14 (×3): 10 meq via INTRAVENOUS
  Filled 2018-02-14: qty 100

## 2018-02-14 MED ORDER — MIDAZOLAM HCL 2 MG/2ML IJ SOLN
INTRAMUSCULAR | Status: AC
  Filled 2018-02-14: qty 2

## 2018-02-14 MED ORDER — FENTANYL CITRATE (PF) 100 MCG/2ML IJ SOLN
INTRAMUSCULAR | Status: AC | PRN
  Administered 2018-02-14: 50 ug via INTRAVENOUS

## 2018-02-14 MED ORDER — PRO-STAT SUGAR FREE PO LIQD
30.0000 mL | Freq: Two times a day (BID) | ORAL | Status: DC
  Administered 2018-02-15 – 2018-02-18 (×6): 30 mL via ORAL
  Filled 2018-02-14 (×8): qty 30

## 2018-02-14 MED ORDER — FENTANYL CITRATE (PF) 100 MCG/2ML IJ SOLN
INTRAMUSCULAR | Status: AC
  Filled 2018-02-14: qty 2

## 2018-02-14 MED ORDER — ADULT MULTIVITAMIN W/MINERALS CH
1.0000 | ORAL_TABLET | Freq: Every day | ORAL | Status: DC
  Administered 2018-02-15 – 2018-02-18 (×4): 1 via ORAL
  Filled 2018-02-14 (×4): qty 1

## 2018-02-14 MED ORDER — MIDAZOLAM HCL 2 MG/2ML IJ SOLN
INTRAMUSCULAR | Status: AC | PRN
  Administered 2018-02-14: 1 mg via INTRAVENOUS

## 2018-02-14 MED ORDER — LIDOCAINE HCL (PF) 1 % IJ SOLN
INTRAMUSCULAR | Status: AC
  Filled 2018-02-14: qty 30

## 2018-02-14 MED ORDER — JUVEN PO PACK
1.0000 | PACK | Freq: Two times a day (BID) | ORAL | Status: DC
  Administered 2018-02-15 – 2018-02-18 (×6): 1 via ORAL
  Filled 2018-02-14 (×8): qty 1

## 2018-02-14 NOTE — Progress Notes (Signed)
Called IR to get estimate of when procedure would be done, stated in an hour or 2, family informed.

## 2018-02-14 NOTE — Progress Notes (Signed)
Physical Therapy Treatment Patient Details Name: Anthony Dunlap MRN: [id number] DOB: August 14, 1933 Today's Date: 02/14/2018    History of Present Illness 84 y.o. male with medical history significant for hypothyroidism, insulin-dependent diabetes mellitus, hyperlipidemia, and history of prostate cancer with chronic Foley catheter, now presenting to the emergency department for evaluation of rigors.  Patient reports that he was in his usual state of health until overnight when he began sweating and developed shaking chills.  In the ED, pt was febrile with HR in 140s.  He also had serum glucose of 440 with anion gap of 12.  Pt admitted with sepsis and CT revealed muliple intramuscular abscesses of the abdomen and pelvis.  Pt is s/p drainage placement on 12/7 and drainage placement on 12/11.     PT Comments    Pt is making slow progress towards his goals and is limited in his mobility by incontinence of stool with upright standing, as well as decreased strength and balance. Pt has been able to come to standing from elevated surface with only min guard but in additional attempts requires more assist. He is also able to progress his standing balance. D/c plans remain appropriate.     Follow Up Recommendations  SNF;Supervision/Assistance - 24 hour     Equipment Recommendations  None recommended by PT    Recommendations for Other Services OT consult     Precautions / Restrictions Precautions Precautions: Fall Precaution Comments: 1 jackson prat drain in place  Restrictions Weight Bearing Restrictions: No    Mobility  Bed Mobility Overal bed mobility: Needs Assistance Bed Mobility: Supine to Sit     Supine to sit: Min assist     General bed mobility comments: assist to scoot hips to EOB and elevate trunk into sitting; use of rail  Transfers Overall transfer level: Needs assistance   Transfers: Sit to/from Stand Sit to Stand: +2 physical assistance;Mod assist;Min assist;Min guard          General transfer comment: pt was able to stand from elevated bed to [facility] with min guard Assist and stand for 30 sec without assist, sat back to [place] and transferred over to chair,  stood again with minA for coming to upright and stand for 30 sec for cleaning from incontinence of stool, sat back and on 3rd attempt required modAfor standing and assist with sitting back to lower surface of the recliner  Ambulation/Gait             General Gait Details: deferred for pt safety         Balance Overall balance assessment: Needs assistance Sitting-balance support: Bilateral upper extremity supported Sitting balance-Leahy Scale: Poor Sitting balance - Comments: assistance required to maintain sitting balance EOB then min guard/supervision when holding onto [place] standing frame Postural control: Posterior lean Standing balance support: During functional activity;Bilateral upper extremity supported Standing balance-Leahy Scale: Poor Standing balance comment: worked on standing balance, weight shifting, and activity tolerance with use of standing frame; limited due to incontinence of stool upon last standing trial                            Cognition Arousal/Alertness: Awake/alert Behavior During Therapy: WFL for tasks assessed/performed Overall Cognitive Status: Within Functional Limits for tasks assessed  General Comments General comments (skin integrity, edema, etc.): incontinence of stool      Pertinent Vitals/Pain Pain Assessment: Faces Faces Pain Scale: Hurts a little bit Pain Location: bilateral LE with bending knees to get on Stedy Pain Descriptors / Indicators: Grimacing;Guarding Pain Intervention(s): Limited activity within patient's tolerance;Monitored during session;Repositioned           PT Goals (current goals can now be found in the care plan section) Acute Rehab PT Goals PT Goal  Formulation: With patient Time For Goal Achievement: 02/25/18 Potential to Achieve Goals: Poor Progress towards PT goals: Progressing toward goals    Frequency    Min 3X/week      PT Plan Current plan remains appropriate       AM-PAC PT "6 Clicks" Mobility   Outcome Measure  Help needed turning from your back to your side while in a flat bed without using bedrails?: A Lot Help needed moving from lying on your back to sitting on the side of a flat bed without using bedrails?: A Lot Help needed moving to and from a bed to a chair (including a wheelchair)?: Total Help needed standing up from a chair using your arms (e.g., wheelchair or bedside chair)?: A Lot Help needed to walk in hospital room?: Total Help needed climbing 3-5 steps with a railing? : Total 6 Click Score: 9    End of Session Equipment Utilized During Treatment: Gait belt Activity Tolerance: Patient tolerated treatment well Patient left: in chair;with call bell/phone within reach;with chair alarm set Nurse Communication: Mobility status PT Visit Diagnosis: Unsteadiness on feet (R26.81);Other abnormalities of gait and mobility (R26.89);Muscle weakness (generalized) (M62.81)     Time: 6546-5035 PT Time Calculation (min) (ACUTE ONLY): 37 min  Charges:  $Therapeutic Activity: 23-37 mins                     Anthony Dunlap B. Migdalia Dk PT, DPT Acute Rehabilitation Services Pager [phone] Office [phone]    [place] 02/14/2018, 12:46 PM

## 2018-02-14 NOTE — Procedures (Signed)
Interventional Radiology Procedure:   Indications: Left pelvic and left thigh abscesses  Procedure: Placement of left anterior thigh drain  Findings: Small fluid collection in anterior left thigh.  Placed 10 Fr drain and removed 7 ml of cloudy fluid.  Collection decompressed at end of procedure.  No focal fluid along medial left thigh.   Complications: None     EBL: None  Plan: Follow output.     Lloyd Cullinan R. Anselm Pancoast, MD  Pager: [phone]

## 2018-02-14 NOTE — Progress Notes (Addendum)
Initial Nutrition Assessment  DOCUMENTATION CODES:   Not applicable  INTERVENTION:   -D/c Glucerna Shake po BID, each supplement provides 220 kcal and 10 grams of protein -MVI with minerals daily -1 packet Juven BID, each packet provides 80 calories, 8 grams of carbohydrate, and 14 grams of amino acids; supplement contains CaHMB, glutamine, and arginine, to promote wound healing -30 ml Prostat BID, each supplement provides 100 kcals and 15 grams  NUTRITION DIAGNOSIS:   Increased nutrient needs related to wound healing as evidenced by estimated needs.  GOAL:   Patient will meet greater than or equal to 90% of their needs  MONITOR:   PO intake, Supplement acceptance, Labs, Weight trends, Skin, I & O's  REASON FOR ASSESSMENT:   LOS    ASSESSMENT:   Patient is 84 year old male with past medical history of hypothyroidism, insulin-dependent diabetes, hyperlipidemia, prostate cancer with chronic Foley catheter who presented with sepsis and DKA.  Blood culture grew Peptostreptococcus and Bacteroides. Wound culture grew Enterococcus faecalis and Klebsiella pneumonia.  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  IR consulted and he  is status post drain placement.  Patient was also seen by infectious disease who recommended 8 weeks of IV antibiotics.  DKA has resolved.  Pt admitted with multi organism abscess with pelvic osteomyelitis.   12/7- s/p CT Guided Drainage of right rectus abscess 12/11- s/p placement of a 59F drain into the largest portion of the left thigh abscess with aspiration of 60 mL thick, purulent material; additional US guided aspiration of smaller abscesses within the hip adductor musculature yielding < 10 mL purulent material.   Spoke with pt, who was sitting in recliner chair at time of visit. He reports fair appetite ("I'll eat if I like it, but if it's old fashioned fancy food, I won't").  Noted documented meal completion 25-100%. Pt also with various snacks that his daughter brought in (applesauce, peaches, oranges, and chocolate pudding), which pt will often consume. Pt recalls receiving Glucerna supplements, however, "I don't really care for them".   Pt denies any weight loss. Per his reports he has always been slender and and UBW is around 155#. Per wt hx, pt wt has been stable over the past year.   Pt has had minimal progress with therapy- he has been doing mainly standing exercises. Per pt, plan to d/c to SNF once medically stable.   Pt currently NPO for drain replacement with IR today.   Discussed with pt importance of good meal and supplement intake to promote healing.   Noted pt with mild to moderate fat and muscle depletions, which suspect are multi-factorial (decreased mobility and advanced age). Unable to identify malnutrition at this time.   Last Hgb A1c: 9.6 (01/04/18). PTA DM medications are 30 units insulin detemir daily.   Labs reviewed: Na: 132, K: 3.1, CBGS: 104-275 (inpatient orders for glycemic control are 0-9 units insulin aspart TID with meals and 8 units insulin detemir  HS).   NUTRITION - FOCUSED PHYSICAL EXAM:    Most Recent Value  Orbital Region  Mild depletion  Upper Arm Region  Mild depletion  Thoracic and Lumbar Region  No depletion  Buccal Region  Mild depletion  Temple Region  Mild depletion  Clavicle Bone Region  Moderate depletion  Clavicle and Acromion Bone Region  Mild depletion  Scapular Bone Region  Mild depletion  Dorsal Hand  Moderate depletion  Patellar Region  Mild depletion  Anterior Thigh Region  Mild depletion  Posterior Calf Region  Moderate depletion  Edema (RD Assessment)  None  Hair  Reviewed  Eyes  Reviewed  Mouth  Reviewed  Skin  Reviewed  Nails  Reviewed       Diet Order:   Diet Order            Diet NPO time specified Except for: Sips with Meds  Diet effective midnight              EDUCATION NEEDS:    Education needs have been addressed  Skin:  Skin Assessment: Skin Integrity Issues: Skin Integrity Issues:: Incisions, Stage II, Stage I Stage I: rt and lt heel Stage II: buttocks Incisions: closed lt thigh incision  Last BM:  02/13/18  Height:   Ht Readings from Last 1 Encounters:  01/23/18 5\' 8"  (1.727 m)    Weight:   Wt Readings from Last 1 Encounters:  01/23/18 72.9 kg    Ideal Body Weight:  70 kg  BMI:  Body mass index is 24.44 kg/m.  Estimated Nutritional Needs:   Kcal:  1800-2000  Protein:  95-110 grams  Fluid:  1.8-2.0 L    Adreana Coull A. Jimmye Norman, RD, LDN, CDE Pager: [phone] After hours Pager: [phone]

## 2018-02-14 NOTE — Progress Notes (Signed)
PROGRESS NOTE    Anthony Dunlap  LFY:[id number] DOB: May 22, 1933 DOA: 01/17/2018 PCP: [name], MD   Brief Narrative: Patient is 84 year old male with past medical history of hypothyroidism, insulin-dependent diabetes, hyperlipidemia, prostate cancer with chronic Foley catheter who presented with sepsis and DKA.  Blood culture grew Peptostreptococcus and Bacteroides. Wound culture grew Enterococcus faecalis and Klebsiella pneumonia.  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  IR consulted and he  underwent  drain placements.  Patient was also seen by infectious disease who recommended 8 weeks of IV antibiotics.  DKA has resolved. It was found that the drain from the left thigh was pulled out accidentally on 02/11/18.follow-up MRI showed persistent new foci of abscess and improved but persistent abscess on the previous sites. IR planning to put another drain today.  Assessment & Plan:   Principal Problem:   Enterococcus faecalis infection Active Problems:   Sepsis secondary to UTI ([place])   Hypothyroidism   DKA (diabetic ketoacidoses) ([name])   Normocytic anemia   Sepsis due to Bacteroides species ([name])   Sepsis without acute organ dysfunction ([name])   Psoas abscess ([name])   Abscess   Pressure injury of skin  Bacteremia/sepsis/pelvis abscesses: Blood cultures grew bacteroids and Peptostreptococcus.  Wound cultures grew enterococcus and Klebsiella pneumonia.  CT abdomen/pelvis on presentation showed osteomyelitis of the pubic symphysis with abscess in the rectus sheath and left thigh.  Status post drains placement.  Rectus sheath abscess-S/P drain placement by Dr. Kathlene Cote 01/25/2018 Thigh abscess-S/P Aspiration and drain placement anterior thigh abscess 01/29/18 by Dr. Laurence Ferrari ID was  following.  Plan is to continue IV Unasyn for total of 8 weeks. Echocardiogram was unrevealing for endocarditis. Leukocytosis  improved . PICC line was also placed because his last blood cultures have been negative. 02/11/18: IR was planning to repeat CT when thigh drain has about 20 mL or less of output but thigh high drain found to be out accidentally. MRI of the femur showed small new abscess in the vastus medialis muscle on the right side, osseous edema in the pubic bones suggesting osteomyelitis or possible septic pubic symphysis.  Abscess in the left thigh has decreased to 50 cc. IR requested for orthopedics consultation.  I discussed extensively with orthopedics Dr. Rod Can.  He says that he is not a candidate for orthopedic intervention.  Again discussed with IR and we decided to go ahead and plan for the new drain placement today ID reevaluated him on 02/13/18.  DKA: Resolved.  Insulin drip was stopped.  Transition to Levemir and sliding scale insulin.    Hypothyroidism: Continue Synthyroid  Prostate cancer: Has chronic Foley catheter placed  History of reactive thrombocytosis: On aspirin 81 mg daily.    Hyperlipidemia: Continue statin  Lower extremity edema: Edema just confined to bilateral lower legs . Lower extremity edema improving.  His echocardiogram has shown ejection fraction of 66 5%, grade 1 diastolic dysfunction. We rcommend to elevate the legs.Apply compression stockings. Lasix secondary to persistent hyperkalemia.  Hypokalemia/hypomagnesemia: Currently being supplemented.   Anemia:  Most likely associated with his chronic medical problems.  No hematochezia or melena.    Transfused with 1 unit of PRBC on 02/04/18. FOBT  sent and found to be negative.  Jelly like stool: Unknown etiology.  He is eating fine.  His stool was noticed to be clear jelly like.  Negative GI pathogen panel.  He does not complain of any abdominal pain.  Difficile negative.GI consulted,signed off.Improved with psyllium.  Hypothyroidism: TSH in the range of 6.  Increased the levothyroxine to 50  MCG  Deconditioning/debility: Physical therapy evaluated him and recommends SNF on  Discharge.         DVT prophylaxis: SCD Code Status: Full Family Communication: This with the daughter extensively on 02/13/2018 Disposition Plan: Skilled nursing facility .Undetermined about the date.  Needs to be  fully cleared by IR before discharge  Consultants: IR, ID  Procedures: Placement of drains  Antimicrobials: Currently on Unasyn since 12/7.Day 21. Last day Jan 23  Subjective: Patient seen and examined the bedside this afternoon.  Remains hemodynamically stable.  Denies any specific complaints.  No complaints of pain or discomfort.  Eating well.  Objective: Vitals:   02/13/18 0558 02/13/18 1450 02/13/18 2053 02/14/18 0527  BP: 139/89 139/86 (!) 146/75 (!) 145/86  Pulse: (!) 106 (!) 122 (!) 109 (!) 109  Resp: 17 18  17   Temp: 97.9 F (36.6 C) 99.8 F (37.7 C) 99.1 F (37.3 C) 97.9 F (36.6 C)  TempSrc: Oral Oral Oral Oral  SpO2: 98% 100% 96% 96%  Weight:      Height:        Intake/Output Summary (Last 24 hours) at 02/14/2018 1125 Last data filed at 02/14/2018 0900 Gross per 24 hour  Intake 135 ml  Output 1505 ml  Net -1370 ml   Filed Weights   01/17/18 0209 01/17/18 0600 01/23/18 2337  Weight: 70.3 kg 67.1 kg 72.9 kg    Examination:  General exam: Appears calm and comfortable ,Not in distress,thin built/emaciated, generalized weakness HEENT:PERRL,Oral mucosa moist, Ear/Nose normal on gross exam Respiratory system: Bilateral equal air entry, normal vesicular breath sounds, no wheezes or crackles  Cardiovascular system: S1 & S2 heard, RRR. No JVD, murmurs, rubs, gallops or clicks.  Gastrointestinal system: Abdomen is nondistended, soft and nontender. No organomegaly or masses felt. Normal bowel sounds heard.Abdominal drain with scant fluid Central nervous system: Alert and oriented. No focal neurological deficits. Extremities: improved  lower extremity edema, no  clubbing ,no cyanosis, distal peripheral pulses palpable Psychiatry: Judgement and insight appear normal. Mood & affect appropriate. Skin: Stage 2 sacral ulcer GU: Foley     Data Reviewed: I have personally reviewed following labs and imaging studies  CBC: Recent Labs  Lab 02/08/18 0349 02/09/18 0457 02/11/18 0356 02/12/18 0404 02/14/18 0318  WBC 9.7 9.2 8.5 9.0 11.1*  NEUTROABS 7.5 7.1 6.1 6.5 8.6*  HGB 7.5* 7.5* 7.6* 7.8* 7.6*  HCT 25.3* 25.2* 25.0* 26.5* 25.5*  MCV 81.9 81.6 81.7 81.5 81.0  PLT 698* 649* 712* 767* 761*   Basic Metabolic Panel: Recent Labs  Lab 02/09/18 0457 02/10/18 0342 02/11/18 0356 02/12/18 0404 02/14/18 0318  NA 133* 134* 132* 134* 132*  K 3.1* 2.8* 3.0* 3.5 3.1*  CL 96* 94* 95* 97* 98  CO2 27 28 26 27 25   GLUCOSE 129* 166* 149* 209* 223*  BUN 10 14 10 10 8   CREATININE 0.74 1.02 0.69 0.75 0.63  CALCIUM 7.5* 7.5* 7.5* 7.6* 7.8*  MG 1.8  --   --   --   --    GFR: Estimated Creatinine Clearance: 68.9 mL/min (by C-G formula based on SCr of 0.63 mg/dL). Liver Function Tests: No results for input(s): AST, ALT, ALKPHOS, BILITOT, PROT, ALBUMIN in the last 168 hours. No results for input(s): LIPASE, AMYLASE in the last 168 hours. No results for input(s): AMMONIA in the last 168 hours. Coagulation Profile: Recent Labs  Lab 02/14/18 0318  INR 1.23   Cardiac Enzymes: No results for input(s): CKTOTAL, CKMB, CKMBINDEX, TROPONINI in the last 168 hours. BNP (last 3 results) No results for input(s): PROBNP in the last 8760 hours. HbA1C: No results for input(s): HGBA1C in the last 72 hours. CBG: Recent Labs  Lab 02/13/18 1251 02/13/18 1722 02/13/18 2051 02/14/18 0816 02/14/18 1121  GLUCAP 104* 151* 275* 225* 144*   Lipid Profile: No results for input(s): CHOL, HDL, LDLCALC, TRIG, CHOLHDL, LDLDIRECT in the last 72 hours. Thyroid Function Tests: No results for input(s): TSH, T4TOTAL, FREET4, T3FREE, THYROIDAB in the last 72 hours. Anemia  Panel: No results for input(s): VITAMINB12, FOLATE, FERRITIN, TIBC, IRON, RETICCTPCT in the last 72 hours. Sepsis Labs: No results for input(s): PROCALCITON, LATICACIDVEN in the last 168 hours.  Recent Results (from the past 240 hour(s))  Gastrointestinal Panel by PCR , Stool     Status: None   Collection Time: 02/08/18 10:36 PM  Result Value Ref Range Status   Campylobacter species NOT DETECTED NOT DETECTED Final   Plesimonas shigelloides NOT DETECTED NOT DETECTED Final   Salmonella species NOT DETECTED NOT DETECTED Final   Yersinia enterocolitica NOT DETECTED NOT DETECTED Final   Vibrio species NOT DETECTED NOT DETECTED Final   Vibrio cholerae NOT DETECTED NOT DETECTED Final   Enteroaggregative E coli (EAEC) NOT DETECTED NOT DETECTED Final   Enteropathogenic E coli (EPEC) NOT DETECTED NOT DETECTED Final   Enterotoxigenic E coli (ETEC) NOT DETECTED NOT DETECTED Final   Shiga like toxin producing E coli (STEC) NOT DETECTED NOT DETECTED Final   Shigella/Enteroinvasive E coli (EIEC) NOT DETECTED NOT DETECTED Final   Cryptosporidium NOT DETECTED NOT DETECTED Final   Cyclospora cayetanensis NOT DETECTED NOT DETECTED Final   Entamoeba histolytica NOT DETECTED NOT DETECTED Final   Giardia lamblia NOT DETECTED NOT DETECTED Final   Adenovirus F40/41 NOT DETECTED NOT DETECTED Final   Astrovirus NOT DETECTED NOT DETECTED Final   Norovirus GI/GII NOT DETECTED NOT DETECTED Final   Rotavirus A NOT DETECTED NOT DETECTED Final   Sapovirus (I, II, IV, and V) NOT DETECTED NOT DETECTED Final    Comment: Performed at [facility], [address] [place], [place] [address]  C difficile quick scan w PCR reflex     Status: None   Collection Time: 02/10/18  1:14 PM  Result Value Ref Range Status   C Diff antigen NEGATIVE NEGATIVE Final   C Diff toxin NEGATIVE NEGATIVE Final   C Diff interpretation No C. difficile detected.  Final    Comment: Performed at [facility], [place] [address] [place], [place] [address]         Radiology Studies: No results found.      Scheduled Meds: . sodium chloride   Intravenous Once  . acidophilus  1 capsule Oral Daily  . aspirin  81 mg Oral Daily  . feeding supplement (GLUCERNA SHAKE)  237 mL Oral BID BM  . ferrous sulfate  325 mg Oral Q breakfast  . insulin aspart  0-9 Units Subcutaneous TID WC  . insulin detemir  8 Units Subcutaneous QHS  . levothyroxine  50 mcg Oral Q0600  . magnesium oxide  400 mg Oral Daily  . potassium chloride  40 mEq Oral Daily  . povidone-iodine  2 application Topical Once  . pravastatin  10 mg Oral q1800  . psyllium  1 packet Oral Daily  . sodium chloride flush  10-40 mL Intracatheter Q12H  .  sodium chloride flush  5 mL Intracatheter Q8H  . sodium chloride flush  5 mL Intracatheter Q8H   Continuous Infusions: . sodium chloride 250 mL (02/08/18 0852)  . ampicillin-sulbactam (UNASYN) IV 3 g (02/14/18 0527)  . potassium chloride 10 mEq (02/14/18 1041)     LOS: 28 days    Time spent: 35 mins.More than 50% of that time was spent in counseling and/or coordination of care.      Shelly Coss, MD [facility] Pager [phone]  If 7PM-7AM, please contact night-coverage [email or web address] Password [facility] 02/14/2018, 11:25 AM

## 2018-02-15 DIAGNOSIS — E1110 Type 2 diabetes mellitus with ketoacidosis without coma: Secondary | ICD-10-CM

## 2018-02-15 DIAGNOSIS — E039 Hypothyroidism, unspecified: Secondary | ICD-10-CM

## 2018-02-15 LAB — BASIC METABOLIC PANEL
Anion gap: 10 (ref 5–15)
BUN: 10 mg/dL (ref 8–23)
CHLORIDE: 98 mmol/L (ref 98–111)
CO2: 26 mmol/L (ref 22–32)
Calcium: 8 mg/dL — ABNORMAL LOW (ref 8.9–10.3)
Creatinine, Ser: 0.71 mg/dL (ref 0.61–1.24)
GFR calc Af Amer: >60 mL/min (ref >60)
GFR calc non Af Amer: >60 mL/min (ref >60)
Glucose, Bld: 167 mg/dL — ABNORMAL HIGH (ref 70–99)
POTASSIUM: 3.6 mmol/L (ref 3.5–5.1)
Sodium: 134 mmol/L — ABNORMAL LOW (ref 135–145)

## 2018-02-15 LAB — CBC WITH DIFFERENTIAL/PLATELET
ABS IMMATURE GRANULOCYTES: 0.08 10*3/uL — AB (ref 0.00–0.07)
Basophils Absolute: 0.1 10*3/uL (ref 0.0–0.1)
Basophils Relative: 0 %
Eosinophils Absolute: 0.1 10*3/uL (ref 0.0–0.5)
Eosinophils Relative: 1 %
HEMATOCRIT: 25.6 % — AB (ref 39.0–52.0)
Hemoglobin: 7.6 g/dL — ABNORMAL LOW (ref 13.0–17.0)
Immature Granulocytes: 1 %
LYMPHS ABS: 1.5 10*3/uL (ref 0.7–4.0)
LYMPHS PCT: 12 %
MCH: 24.1 pg — ABNORMAL LOW (ref 26.0–34.0)
MCHC: 29.7 g/dL — ABNORMAL LOW (ref 30.0–36.0)
MCV: 81.3 fL (ref 80.0–100.0)
Monocytes Absolute: 1 10*3/uL (ref 0.1–1.0)
Monocytes Relative: 7 %
Neutro Abs: 10.3 10*3/uL — ABNORMAL HIGH (ref 1.7–7.7)
Neutrophils Relative %: 79 %
Platelets: 831 10*3/uL — ABNORMAL HIGH (ref 150–400)
RBC: 3.15 MIL/uL — ABNORMAL LOW (ref 4.22–5.81)
RDW: 15.1 % (ref 11.5–15.5)
WBC: 13 10*3/uL — ABNORMAL HIGH (ref 4.0–10.5)
nRBC: 0 % (ref 0.0–0.2)

## 2018-02-15 LAB — GLUCOSE, CAPILLARY
GLUCOSE-CAPILLARY: 146 mg/dL — AB (ref 70–99)
Glucose-Capillary: 166 mg/dL — ABNORMAL HIGH (ref 70–99)
Glucose-Capillary: 160 mg/dL — ABNORMAL HIGH (ref 70–99)
Glucose-Capillary: 228 mg/dL — ABNORMAL HIGH (ref 70–99)

## 2018-02-15 NOTE — Progress Notes (Signed)
[facility]  PROGRESS NOTE  Anthony Dunlap YIR:[id number] DOB: Nov 08, 1933 DOA: 01/17/2018 PCP: [name], MD   Brief HPI:   84 year old male with a history of hypothyroidism, insulin-dependent diabetes mellitus, hyperlipidemia, prostate cancer, chronic Foley catheter came with sepsis and DKA.  Blood cultures grew Peptostreptococcus and Bacteroides.  Wound culture grew Enterococcus faecalis and Klebsiella pneumoniae.  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  IR was consulted and he underwent drain placement.  Also seen by ID who recommended 8 weeks of IV antibiotics.  DKA has resolved.  Brain in the left eye was pulled out accidentally on 02/11/2018.  Follow-up MRI showed persistent new foci of abscess and improved but persistent abscess on the previous site.  IR was reconsulted and another drain was placed yesterday.    Subjective   Patient seen and examined, denies any complaints.   Assessment/Plan:     Bacteremia/sepsis/pelvic abscess-blood cultures grew Peptostreptococcus and Bacteroides.  Rectus sheath abscess, status post drain placement by Dr Kathlene Cote on 01/25/2018.  Thigh abscess-S/P Aspiration and drain placement anterior thigh abscess 01/29/18 by Dr. Laurence Ferrari ID was  following.  Plan is to continue IV Unasyn for total of 8 weeks. Echocardiogram was unrevealing for endocarditis. Leukocytosis improved . PICC line was also placed because his last blood cultures have been negative. 02/11/18: IR was planning to repeat CT when thigh drain has about 20 mL or less of output but thigh high drain found to be out accidentally. MRI of the femur showed small new abscess in the vastus medialis muscle on the right side, osseous edema in the pubic bones suggesting osteomyelitis or possible septic pubic symphysis.  Abscess in the left thigh has decreased to 50 cc.IR requested for orthopedics consultation.  Dr  Tawanna Solo  discussed extensively with orthopedics Dr. Rod Can.  He says that he is not a candidate for orthopedic intervention.  IR placed left anterior thigh drain on 02/14/2018  Diabetic ketoacidosis-resolved, patient on sliding scale insulin with NovoLog.  Hypothyroidism-continue Synthroid  Prostate cancer-patient has chronic Foley catheter  Lower extremity edema-improved, echocardiogram showed EF 50%, grade 1 diastolic dysfunction.  Anemia-likely due to anemia of chronic disease,.  No hematochezia or melena.  Status post 1 unit PRBC transfusion on 02/04/2018.  FOBT is negative  Hypothyroidism-TSH elevated around 6.  Dose of levothyroxine changed to 50 mcg daily    CBG: Recent Labs  Lab 02/14/18 1731 02/14/18 2229 02/15/18 0814 02/15/18 1217 02/15/18 1635  GLUCAP 140* 225* 166* 160* 228*    CBC: Recent Labs  Lab 02/09/18 0457 02/11/18 0356 02/12/18 0404 02/14/18 0318 02/15/18 0430  WBC 9.2 8.5 9.0 11.1* 13.0*  NEUTROABS 7.1 6.1 6.5 8.6* 10.3*  HGB 7.5* 7.6* 7.8* 7.6* 7.6*  HCT 25.2* 25.0* 26.5* 25.5* 25.6*  MCV 81.6 81.7 81.5 81.0 81.3  PLT 649* 712* 767* 789* 831*    Basic Metabolic Panel: Recent Labs  Lab 02/09/18 0457 02/10/18 0342 02/11/18 0356 02/12/18 0404 02/14/18 0318 02/15/18 0430  NA 133* 134* 132* 134* 132* 134*  K 3.1* 2.8* 3.0* 3.5 3.1* 3.6  CL 96* 94* 95* 97* 98 98  CO2 27 28 26 27 25 26   GLUCOSE 129* 166* 149* 209* 223* 167*  BUN 10 14 10 10 8 10   CREATININE 0.74 1.02 0.69 0.75 0.63 0.71  CALCIUM 7.5* 7.5* 7.5* 7.6* 7.8* 8.0*  MG 1.8  --   --   --   --   --  DVT prophylaxis: SCDs  Code Status: Full code  Family Communication: No family at bedside  Disposition Plan: likely home when medically ready for discharge   Consultants:  IR  ID  Procedures:  Drain placement per IR   Antibiotics:   Anti-infectives (From admission, onward)   Start     Dose/Rate Route Frequency Ordered Stop   01/25/18 1700   Ampicillin-Sulbactam (UNASYN) 3 g in sodium chloride 0.9 % 100 mL IVPB     3 g 200 mL/hr over 30 Minutes Intravenous Every 6 hours 01/25/18 1604     01/23/18 2200  vancomycin (VANCOCIN) IVPB 1000 mg/200 mL premix  Status:  Discontinued     1,000 mg 200 mL/hr over 60 Minutes Intravenous Every 24 hours 01/23/18 0817 01/24/18 1002   01/23/18 0100  piperacillin-tazobactam (ZOSYN) IVPB 3.375 g  Status:  Discontinued     3.375 g 12.5 mL/hr over 240 Minutes Intravenous Every 8 hours 01/23/18 0057 01/25/18 1549   01/23/18 0100  vancomycin (VANCOCIN) IVPB 1000 mg/200 mL premix     1,000 mg 200 mL/hr over 60 Minutes Intravenous  Once 01/23/18 0057 01/23/18 0214   01/22/18 1630  meropenem (MERREM) 1 g in sodium chloride 0.9 % 100 mL IVPB  Status:  Discontinued     1 g 200 mL/hr over 30 Minutes Intravenous Every 8 hours 01/22/18 1625 01/22/18 1626   01/22/18 1630  metroNIDAZOLE (FLAGYL) IVPB 500 mg  Status:  Discontinued     500 mg 100 mL/hr over 60 Minutes Intravenous Every 8 hours 01/22/18 1626 01/23/18 0028   01/21/18 1200  penicillin G 3 million units in sodium chloride 0.9% 100 mL IVPB  Status:  Discontinued     3 Million Units 200 mL/hr over 30 Minutes Intravenous Every 4 hours 01/21/18 1050 01/21/18 1100   01/21/18 1200  penicillin G potassium 3 Million Units in dextrose 34mL IVPB  Status:  Discontinued     3 Million Units 100 mL/hr over 30 Minutes Intravenous Every 4 hours 01/21/18 1100 01/21/18 1106   01/21/18 1200  penicillin G 3 million units in sodium chloride 0.9% 100 mL IVPB  Status:  Discontinued     3 Million Units 200 mL/hr over 30 Minutes Intravenous Every 4 hours 01/21/18 1106 01/22/18 1625   01/19/18 1400  ceFAZolin (ANCEF) IVPB 1 g/50 mL premix  Status:  Discontinued     1 g 100 mL/hr over 30 Minutes Intravenous Every 8 hours 01/19/18 1104 01/21/18 1050   01/17/18 1200  ceFEPIme (MAXIPIME) 1 g in sodium chloride 0.9 % 100 mL IVPB  Status:  Discontinued     1 g 200 mL/hr over  30 Minutes Intravenous Every 12 hours 01/17/18 0741 01/19/18 1104   01/17/18 0230  ceFEPIme (MAXIPIME) 2 g in sodium chloride 0.9 % 100 mL IVPB     2 g 200 mL/hr over 30 Minutes Intravenous  Once 01/17/18 0224 01/17/18 0400       Objective   Vitals:   02/14/18 1702 02/14/18 2232 02/15/18 0509 02/15/18 1442  BP: 137/76 136/77 130/85 140/81  Pulse: (!) 103 (!) 105 (!) 110 (!) 106  Resp: 18 18 17 17   Temp: 98.5 F (36.9 C) 98.7 F (37.1 C) 99.3 F (37.4 C) 98.3 F (36.8 C)  TempSrc: Oral Oral Oral Oral  SpO2: 97% 99% 98% 95%  Weight:      Height:        Intake/Output Summary (Last 24 hours) at 02/15/2018 1957 Last data filed at 02/15/2018  1639 Gross per 24 hour  Intake 320 ml  Output 1107 ml  Net -787 ml   Filed Weights   01/17/18 0209 01/17/18 0600 01/23/18 2337  Weight: 70.3 kg 67.1 kg 72.9 kg     Physical Examination:    General: Appears in no acute distress  Cardiovascular: S1S2, regular, no murmurs auscultated  Respiratory: Clear to auscultation bilaterally  Abdomen: Soft, nontender, no organomegaly  Extremities: No edema of the lower extremities, drain  in place left thigh  Neurologic: Alert, oriented x3, no focal deficit noted     Data Reviewed: I have personally reviewed following labs and imaging studies   Recent Results (from the past 240 hour(s))  Gastrointestinal Panel by PCR , Stool     Status: None   Collection Time: 02/08/18 10:36 PM  Result Value Ref Range Status   Campylobacter species NOT DETECTED NOT DETECTED Final   Plesimonas shigelloides NOT DETECTED NOT DETECTED Final   Salmonella species NOT DETECTED NOT DETECTED Final   Yersinia enterocolitica NOT DETECTED NOT DETECTED Final   Vibrio species NOT DETECTED NOT DETECTED Final   Vibrio cholerae NOT DETECTED NOT DETECTED Final   Enteroaggregative E coli (EAEC) NOT DETECTED NOT DETECTED Final   Enteropathogenic E coli (EPEC) NOT DETECTED NOT DETECTED Final   Enterotoxigenic E  coli (ETEC) NOT DETECTED NOT DETECTED Final   Shiga like toxin producing E coli (STEC) NOT DETECTED NOT DETECTED Final   Shigella/Enteroinvasive E coli (EIEC) NOT DETECTED NOT DETECTED Final   Cryptosporidium NOT DETECTED NOT DETECTED Final   Cyclospora cayetanensis NOT DETECTED NOT DETECTED Final   Entamoeba histolytica NOT DETECTED NOT DETECTED Final   Giardia lamblia NOT DETECTED NOT DETECTED Final   Adenovirus F40/41 NOT DETECTED NOT DETECTED Final   Astrovirus NOT DETECTED NOT DETECTED Final   Norovirus GI/GII NOT DETECTED NOT DETECTED Final   Rotavirus A NOT DETECTED NOT DETECTED Final   Sapovirus (I, II, IV, and V) NOT DETECTED NOT DETECTED Final    Comment: Performed at [facility], [address] [place], [place] [address]  C difficile quick scan w PCR reflex     Status: None   Collection Time: 02/10/18  1:14 PM  Result Value Ref Range Status   C Diff antigen NEGATIVE NEGATIVE Final   C Diff toxin NEGATIVE NEGATIVE Final   C Diff interpretation No C. difficile detected.  Final    Comment: Performed at [facility], [place] [address], [place], [place] [address]     Liver Function Tests: No results for input(s): AST, ALT, ALKPHOS, BILITOT, PROT, ALBUMIN in the last 168 hours. No results for input(s): LIPASE, AMYLASE in the last 168 hours. No results for input(s): AMMONIA in the last 168 hours.  Cardiac Enzymes: No results for input(s): CKTOTAL, CKMB, CKMBINDEX, TROPONINI in the last 168 hours. BNP (last 3 results) No results for input(s): BNP in the last 8760 hours.  ProBNP (last 3 results) No results for input(s): PROBNP in the last 8760 hours.    Studies: US Guided Needle Placement  Result Date: 02/14/2018 INDICATION: 84 year old with multiple abscesses in the left thigh and pelvic region. Previous left anterior thigh abscess drain was inadvertently pulled out. Recent MRI demonstrates residual fluid collections. Plan for placement of another drain in  the anterior thigh collection. EXAM: PLACEMENT OF LEFT THIGH DRAIN USING ULTRASOUND GUIDANCE MEDICATIONS: Sedation medications ANESTHESIA/SEDATION: Fentanyl 50 mcg IV; Versed 1.0 mg IV Moderate Sedation Time:  31 minutes The patient was continuously monitored  during the procedure by the interventional radiology nurse under my direct supervision. COMPLICATIONS: None immediate. PROCEDURE: Informed written consent was obtained from the patient after a thorough discussion of the procedural risks, benefits and alternatives. All questions were addressed. Maximal Sterile Barrier Technique was utilized including caps, mask, sterile gowns, sterile gloves, sterile drape, hand hygiene and skin antiseptic. A timeout was performed prior to the initiation of the procedure. The left thigh was evaluated with ultrasound. Fluid collection in the anterior left thigh musculature was identified and this represents the site of the previous drain. Recent MRI raised concern for a new fluid collection along the medial left thigh. No discrete fluid collection identified in the medial left thigh. The anterior left thigh was prepped with chlorhexidine and sterile field was created. Skin was anesthetized with 1% lidocaine. 18 gauge trocar needle directed into the anterior left thigh fluid collection with ultrasound guidance and a small amount of cloudy fluid was aspirated. Stiff Amplatz wire was advanced into the collection. The tract was dilated to accommodate a 10 [place] multipurpose drain. Small amount of additional cloudy fluid was removed. The collection was decompressed. Drain was sutured to skin and attached to a suction bulb. FINDINGS: Small amount of fluid in the anterior left thigh soft tissues. Approximately 7 mL of cloudy fluid was removed from this collection. The collection was decompressed with a drain at the end of the procedure. The medial left thigh was evaluated with ultrasound to look for a discrete fluid collection. There  is subcutaneous edema in this area but no discrete fluid collection. IMPRESSION: Successful ultrasound-guided placement of a drain within the small anterior left thigh fluid collection. Electronically Signed   By: Markus Daft M.D.   On: 02/14/2018 17:19    Scheduled Meds: . sodium chloride   Intravenous Once  . acidophilus  1 capsule Oral Daily  . aspirin  81 mg Oral Daily  . feeding supplement (PRO-STAT SUGAR FREE 64)  30 mL Oral BID  . ferrous sulfate  325 mg Oral Q breakfast  . insulin aspart  0-9 Units Subcutaneous TID WC  . insulin detemir  8 Units Subcutaneous QHS  . levothyroxine  50 mcg Oral Q0600  . magnesium oxide  400 mg Oral Daily  . multivitamin with minerals  1 tablet Oral Daily  . nutrition supplement (JUVEN)  1 packet Oral BID BM  . potassium chloride  40 mEq Oral Daily  . povidone-iodine  2 application Topical Once  . pravastatin  10 mg Oral q1800  . psyllium  1 packet Oral Daily  . sodium chloride flush  10-40 mL Intracatheter Q12H  . sodium chloride flush  5 mL Intracatheter Q8H  . sodium chloride flush  5 mL Intracatheter Q8H    Admission status: Inpatient: Based on patients clinical presentation and evaluation of above clinical data, I have made determination that patient meets Inpatient criteria at this time.  Time spent: 20 min  [facility] Pager [phone]. If 7PM-7AM, please contact night-coverage at [email or web address], Office  [phone]  password [email or web address]  02/15/2018, 7:57 PM  LOS: 29 days

## 2018-02-16 LAB — GLUCOSE, CAPILLARY
Glucose-Capillary: 106 mg/dL — ABNORMAL HIGH (ref 70–99)
Glucose-Capillary: 319 mg/dL — ABNORMAL HIGH (ref 70–99)
Glucose-Capillary: 183 mg/dL — ABNORMAL HIGH (ref 70–99)
Glucose-Capillary: 256 mg/dL — ABNORMAL HIGH (ref 70–99)

## 2018-02-16 NOTE — Progress Notes (Signed)
[facility]  PROGRESS NOTE  Anthony Dunlap IRW:[id number] DOB: 04-Dec-1933 DOA: 01/17/2018 PCP: [name], MD   Brief HPI:   84 year old male with a history of hypothyroidism, insulin-dependent diabetes mellitus, hyperlipidemia, prostate cancer, chronic Foley catheter came with sepsis and DKA.  Blood cultures grew Peptostreptococcus and Bacteroides.  Wound culture grew Enterococcus faecalis and Klebsiella pneumoniae.  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  IR was consulted and he underwent drain placement.  Also seen by ID who recommended 8 weeks of IV antibiotics.  DKA has resolved.  Brain in the left eye was pulled out accidentally on 02/11/2018.  Follow-up MRI showed persistent new foci of abscess and improved but persistent abscess on the previous site.  IR was reconsulted and another drain was placed yesterday.    Subjective   Patient seen and examined, denies new complaints.   Assessment/Plan:     Bacteremia/sepsis/pelvic abscess-blood cultures grew Peptostreptococcus and Bacteroides.  Rectus sheath abscess, status post drain placement by Dr Kathlene Cote on 01/25/2018.  Thigh abscess-S/P Aspiration and drain placement anterior thigh abscess 01/29/18 by Dr. Laurence Ferrari ID was  following.  Plan is to continue IV Unasyn for total of 8 weeks. Echocardiogram was unrevealing for endocarditis. Leukocytosis improved . PICC line was also placed because his last blood cultures have been negative. 02/11/18: IR was planning to repeat CT when thigh drain has about 20 mL or less of output but thigh high drain found to be out accidentally. MRI of the femur showed small new abscess in the vastus medialis muscle on the right side, osseous edema in the pubic bones suggesting osteomyelitis or possible septic pubic symphysis.  Abscess in the left thigh has decreased to 50 cc.IR requested for orthopedics consultation.  Dr  Tawanna Solo  discussed extensively with orthopedics Dr. Rod Can.  He says that he is not a candidate for orthopedic intervention.  IR placed left anterior thigh drain on 02/14/2018  Diabetic ketoacidosis-resolved, patient on sliding scale insulin with NovoLog.  Blood glucose is controlled.  Hypothyroidism-continue Synthroid  Prostate cancer-patient has chronic Foley catheter  Lower extremity edema-improved, echocardiogram showed EF 76%, grade 1 diastolic dysfunction.  Anemia-likely due to anemia of chronic disease,.  No hematochezia or melena.  Status post 1 unit PRBC transfusion on 02/04/2018.  FOBT is negative  Hypothyroidism-TSH elevated around 6.  Dose of levothyroxine changed to 50 mcg daily    CBG: Recent Labs  Lab 02/15/18 1217 02/15/18 1635 02/15/18 2127 02/16/18 0749 02/16/18 1227  GLUCAP 160* 228* 146* 106* 319*    CBC: Recent Labs  Lab 02/11/18 0356 02/12/18 0404 02/14/18 0318 02/15/18 0430  WBC 8.5 9.0 11.1* 13.0*  NEUTROABS 6.1 6.5 8.6* 10.3*  HGB 7.6* 7.8* 7.6* 7.6*  HCT 25.0* 26.5* 25.5* 25.6*  MCV 81.7 81.5 81.0 81.3  PLT 712* 767* 789* 831*    Basic Metabolic Panel: Recent Labs  Lab 02/10/18 0342 02/11/18 0356 02/12/18 0404 02/14/18 0318 02/15/18 0430  NA 134* 132* 134* 132* 134*  K 2.8* 3.0* 3.5 3.1* 3.6  CL 94* 95* 97* 98 98  CO2 28 26 27 25 26   GLUCOSE 166* 149* 209* 223* 167*  BUN 14 10 10 8 10   CREATININE 1.02 0.69 0.75 0.63 0.71  CALCIUM 7.5* 7.5* 7.6* 7.8* 8.0*     DVT prophylaxis: SCDs  Code Status: Full code  Family Communication: No family at bedside  Disposition Plan: likely skilled nursing facility rehab  once bed becomes available.   Consultants:  IR  ID  Procedures:  Drain placement per IR   Antibiotics:   Anti-infectives (From admission, onward)   Start     Dose/Rate Route Frequency Ordered Stop   01/25/18 1700  Ampicillin-Sulbactam (UNASYN) 3 g in sodium chloride 0.9 % 100 mL IVPB     3 g 200  mL/hr over 30 Minutes Intravenous Every 6 hours 01/25/18 1604     01/23/18 2200  vancomycin (VANCOCIN) IVPB 1000 mg/200 mL premix  Status:  Discontinued     1,000 mg 200 mL/hr over 60 Minutes Intravenous Every 24 hours 01/23/18 0817 01/24/18 1002   01/23/18 0100  piperacillin-tazobactam (ZOSYN) IVPB 3.375 g  Status:  Discontinued     3.375 g 12.5 mL/hr over 240 Minutes Intravenous Every 8 hours 01/23/18 0057 01/25/18 1549   01/23/18 0100  vancomycin (VANCOCIN) IVPB 1000 mg/200 mL premix     1,000 mg 200 mL/hr over 60 Minutes Intravenous  Once 01/23/18 0057 01/23/18 0214   01/22/18 1630  meropenem (MERREM) 1 g in sodium chloride 0.9 % 100 mL IVPB  Status:  Discontinued     1 g 200 mL/hr over 30 Minutes Intravenous Every 8 hours 01/22/18 1625 01/22/18 1626   01/22/18 1630  metroNIDAZOLE (FLAGYL) IVPB 500 mg  Status:  Discontinued     500 mg 100 mL/hr over 60 Minutes Intravenous Every 8 hours 01/22/18 1626 01/23/18 0028   01/21/18 1200  penicillin G 3 million units in sodium chloride 0.9% 100 mL IVPB  Status:  Discontinued     3 Million Units 200 mL/hr over 30 Minutes Intravenous Every 4 hours 01/21/18 1050 01/21/18 1100   01/21/18 1200  penicillin G potassium 3 Million Units in dextrose 58mL IVPB  Status:  Discontinued     3 Million Units 100 mL/hr over 30 Minutes Intravenous Every 4 hours 01/21/18 1100 01/21/18 1106   01/21/18 1200  penicillin G 3 million units in sodium chloride 0.9% 100 mL IVPB  Status:  Discontinued     3 Million Units 200 mL/hr over 30 Minutes Intravenous Every 4 hours 01/21/18 1106 01/22/18 1625   01/19/18 1400  ceFAZolin (ANCEF) IVPB 1 g/50 mL premix  Status:  Discontinued     1 g 100 mL/hr over 30 Minutes Intravenous Every 8 hours 01/19/18 1104 01/21/18 1050   01/17/18 1200  ceFEPIme (MAXIPIME) 1 g in sodium chloride 0.9 % 100 mL IVPB  Status:  Discontinued     1 g 200 mL/hr over 30 Minutes Intravenous Every 12 hours 01/17/18 0741 01/19/18 1104   01/17/18 0230   ceFEPIme (MAXIPIME) 2 g in sodium chloride 0.9 % 100 mL IVPB     2 g 200 mL/hr over 30 Minutes Intravenous  Once 01/17/18 0224 01/17/18 0400       Objective   Vitals:   02/15/18 1442 02/15/18 2130 02/16/18 0532 02/16/18 1426  BP: 140/81 (!) 150/84 (!) 151/96 (!) 146/88  Pulse: (!) 106 (!) 115 (!) 113 (!) 109  Resp: 17 17 16 16   Temp: 98.3 F (36.8 C) 98.4 F (36.9 C) 98.2 F (36.8 C) 98.8 F (37.1 C)  TempSrc: Oral Oral Oral Oral  SpO2: 95% 97% 98% 99%  Weight:      Height:        Intake/Output Summary (Last 24 hours) at 02/16/2018 1637 Last data filed at 02/16/2018 1330 Gross per 24 hour  Intake 695 ml  Output 2570 ml  Net -1875 ml  Filed Weights   01/17/18 0209 01/17/18 0600 01/23/18 2337  Weight: 70.3 kg 67.1 kg 72.9 kg     Physical Examination:     General: Appears in no acute distress  Cardiovascular: S1-S2, regular  Respiratory: Clear to auscultation bilaterally  Abdomen: Soft, nontender, no organomegaly  Musculoskeletal: No edema in the lower extremities     Data Reviewed: I have personally reviewed following labs and imaging studies   Recent Results (from the past 240 hour(s))  Gastrointestinal Panel by PCR , Stool     Status: None   Collection Time: 02/08/18 10:36 PM  Result Value Ref Range Status   Campylobacter species NOT DETECTED NOT DETECTED Final   Plesimonas shigelloides NOT DETECTED NOT DETECTED Final   Salmonella species NOT DETECTED NOT DETECTED Final   Yersinia enterocolitica NOT DETECTED NOT DETECTED Final   Vibrio species NOT DETECTED NOT DETECTED Final   Vibrio cholerae NOT DETECTED NOT DETECTED Final   Enteroaggregative E coli (EAEC) NOT DETECTED NOT DETECTED Final   Enteropathogenic E coli (EPEC) NOT DETECTED NOT DETECTED Final   Enterotoxigenic E coli (ETEC) NOT DETECTED NOT DETECTED Final   Shiga like toxin producing E coli (STEC) NOT DETECTED NOT DETECTED Final   Shigella/Enteroinvasive E coli (EIEC) NOT DETECTED  NOT DETECTED Final   Cryptosporidium NOT DETECTED NOT DETECTED Final   Cyclospora cayetanensis NOT DETECTED NOT DETECTED Final   Entamoeba histolytica NOT DETECTED NOT DETECTED Final   Giardia lamblia NOT DETECTED NOT DETECTED Final   Adenovirus F40/41 NOT DETECTED NOT DETECTED Final   Astrovirus NOT DETECTED NOT DETECTED Final   Norovirus GI/GII NOT DETECTED NOT DETECTED Final   Rotavirus A NOT DETECTED NOT DETECTED Final   Sapovirus (I, II, IV, and V) NOT DETECTED NOT DETECTED Final    Comment: Performed at [facility], [address] [place], [place] [address]  C difficile quick scan w PCR reflex     Status: None   Collection Time: 02/10/18  1:14 PM  Result Value Ref Range Status   C Diff antigen NEGATIVE NEGATIVE Final   C Diff toxin NEGATIVE NEGATIVE Final   C Diff interpretation No C. difficile detected.  Final    Comment: Performed at [facility], [place] [address] [place], [place] [address]       Studies: No results found.  Scheduled Meds: . sodium chloride   Intravenous Once  . acidophilus  1 capsule Oral Daily  . aspirin  81 mg Oral Daily  . feeding supplement (PRO-STAT SUGAR FREE 64)  30 mL Oral BID  . ferrous sulfate  325 mg Oral Q breakfast  . insulin aspart  0-9 Units Subcutaneous TID WC  . insulin detemir  8 Units Subcutaneous QHS  . levothyroxine  50 mcg Oral Q0600  . magnesium oxide  400 mg Oral Daily  . multivitamin with minerals  1 tablet Oral Daily  . nutrition supplement (JUVEN)  1 packet Oral BID BM  . potassium chloride  40 mEq Oral Daily  . povidone-iodine  2 application Topical Once  . pravastatin  10 mg Oral q1800  . psyllium  1 packet Oral Daily  . sodium chloride flush  10-40 mL Intracatheter Q12H  . sodium chloride flush  5 mL Intracatheter Q8H  . sodium chloride flush  5 mL Intracatheter Q8H    Admission status: Inpatient: Based on patients clinical presentation and evaluation of above clinical data, I have made  determination that patient meets Inpatient criteria at this  time.  Time spent: 20 min  [facility] Pager [phone]. If 7PM-7AM, please contact night-coverage at [email or web address], Office  [phone]  password [email or web address]  02/16/2018, 4:37 PM  LOS: 30 days

## 2018-02-16 NOTE — Progress Notes (Signed)
Chief Complaint: Patient was seen today for abscess drains   Supervising Physician: Arne Cleveland  Patient Status: [facility] - In-pt  Subjective: Follow up drains. Pt feeling pretty good. (L)thigh drain replaced 12/27.  Objective: Physical Exam: BP (!) 151/96 (BP Location: Left Arm)   Pulse (!) 113   Temp 98.2 F (36.8 C) (Oral)   Resp 16   Ht 5\' 8"  (1.727 m)   Wt 72.9 kg   SpO2 98%   BMI 24.44 kg/m  (R)rectus sheath drain still with cloudy yellow output (L)thigh drain intact, serous output.   Current Facility-Administered Medications:  .  0.9 %  sodium chloride infusion (Manually program via Guardrails IV Fluids), , Intravenous, Once, Georgette Shell, MD .  0.9 %  sodium chloride infusion, , Intravenous, PRN, Georgette Shell, MD, Last Rate: 10 mL/hr at 02/08/18 0852, 250 mL at 02/08/18 0852 .  acetaminophen (TYLENOL) tablet 650 mg, 650 mg, Oral, Q6H PRN, Tat, Habeeb, MD, 650 mg at 01/25/18 2132 .  acidophilus (RISAQUAD) capsule 1 capsule, 1 capsule, Oral, Daily, Vena Rua, PA-C, 1 capsule at 02/16/18 1056 .  Ampicillin-Sulbactam (UNASYN) 3 g in sodium chloride 0.9 % 100 mL IVPB, 3 g, Intravenous, Q6H, Mancheril, Darnell Level, RPH, Last Rate: 200 mL/hr at 02/16/18 1103, 3 g at 02/16/18 1103 .  aspirin chewable tablet 81 mg, 81 mg, Oral, Daily, Adhikari, Amrit, MD, 81 mg at 02/16/18 1057 .  feeding supplement (PRO-STAT SUGAR FREE 64) liquid 30 mL, 30 mL, Oral, BID, Adhikari, Amrit, MD, 30 mL at 02/16/18 1056 .  ferrous sulfate tablet 325 mg, 325 mg, Oral, Q breakfast, Adhikari, Amrit, MD, 325 mg at 02/16/18 0488 .  ibuprofen (ADVIL,MOTRIN) tablet 400 mg, 400 mg, Oral, Q12H PRN, Tat, Maor, MD .  insulin aspart (novoLOG) injection 0-9 Units, 0-9 Units, Subcutaneous, TID WC, Orson Eva, MD, 3 Units at 02/15/18 1736 .  insulin detemir (LEVEMIR) injection 8 Units, 8 Units, Subcutaneous, QHS, Shelly Coss, MD, 8 Units at 02/15/18 2226 .  levothyroxine (SYNTHROID,  LEVOTHROID) tablet 50 mcg, 50 mcg, Oral, Q0600, Shelly Coss, MD, 50 mcg at 02/16/18 0528 .  magnesium oxide (MAG-OX) tablet 400 mg, 400 mg, Oral, Daily, Adhikari, Amrit, MD, 400 mg at 02/16/18 1056 .  multivitamin with minerals tablet 1 tablet, 1 tablet, Oral, Daily, Shelly Coss, MD, 1 tablet at 02/16/18 1056 .  nutrition supplement (JUVEN) (JUVEN) powder packet 1 packet, 1 packet, Oral, BID BM, Shelly Coss, MD, 1 packet at 02/16/18 1056 .  ondansetron (ZOFRAN) injection 4 mg, 4 mg, Intravenous, Q6H PRN, Tat, Mannix, MD, 4 mg at 01/18/18 1014 .  potassium chloride SA (K-DUR,KLOR-CON) CR tablet 40 mEq, 40 mEq, Oral, Daily, Adhikari, Amrit, MD, 40 mEq at 02/16/18 1056 .  povidone-iodine 10 % swab 2 application, 2 application, Topical, Once, Paralee Cancel, MD .  pravastatin (PRAVACHOL) tablet 10 mg, 10 mg, Oral, q1800, Tat, Shanon Brow, MD, 10 mg at 02/15/18 1737 .  psyllium (HYDROCIL/METAMUCIL) packet 1 packet, 1 packet, Oral, Daily, Vena Rua, PA-C, 1 packet at 02/15/18 1026 .  sodium chloride flush (NS) 0.9 % injection 10-40 mL, 10-40 mL, Intracatheter, Q12H, Adhikari, Amrit, MD, 10 mL at 02/14/18 2236 .  sodium chloride flush (NS) 0.9 % injection 10-40 mL, 10-40 mL, Intracatheter, PRN, Adhikari, Amrit, MD .  sodium chloride flush (NS) 0.9 % injection 5 mL, 5 mL, Intracatheter, Q8H, Aletta Edouard, MD, 5 mL at 02/16/18 0538 .  sodium chloride flush (NS) 0.9 % injection 5 mL,  5 mL, Intracatheter, Q8H, Jacqulynn Cadet, MD, 5 mL at 02/16/18 0537  Labs: CBC Recent Labs    02/14/18 0318 02/15/18 0430  WBC 11.1* 13.0*  HGB 7.6* 7.6*  HCT 25.5* 25.6*  PLT 789* 831*   BMET Recent Labs    02/14/18 0318 02/15/18 0430  NA 132* 134*  K 3.1* 3.6  CL 98 98  CO2 25 26  GLUCOSE 223* 167*  BUN 8 10  CREATININE 0.63 0.71  CALCIUM 7.8* 8.0*   LFT No results for input(s): PROT, ALBUMIN, AST, ALT, ALKPHOS, BILITOT, BILIDIR, IBILI, LIPASE in the last 72 hours. PT/INR Recent Labs     02/14/18 0318  LABPROT 15.4*  INR 1.23     Studies/Results: US Guided Needle Placement  Result Date: 02/14/2018 INDICATION: 84 year old with multiple abscesses in the left thigh and pelvic region. Previous left anterior thigh abscess drain was inadvertently pulled out. Recent MRI demonstrates residual fluid collections. Plan for placement of another drain in the anterior thigh collection. EXAM: PLACEMENT OF LEFT THIGH DRAIN USING ULTRASOUND GUIDANCE MEDICATIONS: Sedation medications ANESTHESIA/SEDATION: Fentanyl 50 mcg IV; Versed 1.0 mg IV Moderate Sedation Time:  31 minutes The patient was continuously monitored during the procedure by the interventional radiology nurse under my direct supervision. COMPLICATIONS: None immediate. PROCEDURE: Informed written consent was obtained from the patient after a thorough discussion of the procedural risks, benefits and alternatives. All questions were addressed. Maximal Sterile Barrier Technique was utilized including caps, mask, sterile gowns, sterile gloves, sterile drape, hand hygiene and skin antiseptic. A timeout was performed prior to the initiation of the procedure. The left thigh was evaluated with ultrasound. Fluid collection in the anterior left thigh musculature was identified and this represents the site of the previous drain. Recent MRI raised concern for a new fluid collection along the medial left thigh. No discrete fluid collection identified in the medial left thigh. The anterior left thigh was prepped with chlorhexidine and sterile field was created. Skin was anesthetized with 1% lidocaine. 18 gauge trocar needle directed into the anterior left thigh fluid collection with ultrasound guidance and a small amount of cloudy fluid was aspirated. Stiff Amplatz wire was advanced into the collection. The tract was dilated to accommodate a 10 [place] multipurpose drain. Small amount of additional cloudy fluid was removed. The collection was decompressed.  Drain was sutured to skin and attached to a suction bulb. FINDINGS: Small amount of fluid in the anterior left thigh soft tissues. Approximately 7 mL of cloudy fluid was removed from this collection. The collection was decompressed with a drain at the end of the procedure. The medial left thigh was evaluated with ultrasound to look for a discrete fluid collection. There is subcutaneous edema in this area but no discrete fluid collection. IMPRESSION: Successful ultrasound-guided placement of a drain within the small anterior left thigh fluid collection. Electronically Signed   By: Markus Daft M.D.   On: 02/14/2018 17:19    Assessment/Plan: (R)rectus sheath and (L)thigh abscess drains Stable, improving. Pt states he is going to rehab tomorrow?    LOS: 30 days   I spent a total of 15 minutes in face to face in clinical consultation, greater than 50% of which was counseling/coordinating care for drain care  Ascencion Dike PA-C 02/16/2018 12:22 PM

## 2018-02-17 DIAGNOSIS — E119 Type 2 diabetes mellitus without complications: Secondary | ICD-10-CM

## 2018-02-17 DIAGNOSIS — L02416 Cutaneous abscess of left lower limb: Secondary | ICD-10-CM

## 2018-02-17 LAB — CREATININE, SERUM
Creatinine, Ser: 0.61 mg/dL (ref 0.61–1.24)
GFR calc Af Amer: >60 mL/min (ref >60)
GFR calc non Af Amer: >60 mL/min (ref >60)

## 2018-02-17 LAB — GLUCOSE, CAPILLARY
Glucose-Capillary: 143 mg/dL — ABNORMAL HIGH (ref 70–99)
Glucose-Capillary: 333 mg/dL — ABNORMAL HIGH (ref 70–99)
Glucose-Capillary: 430 mg/dL — ABNORMAL HIGH (ref 70–99)
Glucose-Capillary: 68 mg/dL — ABNORMAL LOW (ref 70–99)
Glucose-Capillary: 144 mg/dL — ABNORMAL HIGH (ref 70–99)

## 2018-02-17 MED ORDER — INSULIN ASPART 100 UNIT/ML ~~LOC~~ SOLN
0.0000 [IU] | Freq: Three times a day (TID) | SUBCUTANEOUS | Status: DC
  Administered 2018-02-18 (×2): 11 [IU] via SUBCUTANEOUS
  Administered 2018-02-18: 3 [IU] via SUBCUTANEOUS

## 2018-02-17 MED ORDER — INSULIN DETEMIR 100 UNIT/ML ~~LOC~~ SOLN: 15.0000 [IU] | Freq: Every day | SUBCUTANEOUS | Status: DC

## 2018-02-17 MED ORDER — INSULIN DETEMIR 100 UNIT/ML ~~LOC~~ SOLN
12.0000 [IU] | Freq: Every day | SUBCUTANEOUS | Status: DC
  Administered 2018-02-17: 12 [IU] via SUBCUTANEOUS
  Filled 2018-02-17 (×3): qty 0.12

## 2018-02-17 NOTE — Social Work (Addendum)
1:39pm- Spoke with pt daughter [name] on phone, she understands that CSW has restarted insurance approval for SNF for pt. Pt daughter still concerned that facility cannot manage drains, however CSW has confirmed with [facility] that facility is able to manage drain. Provided Gerald Stabs with daughters number (with her permission) to ensure that facility will meet pt needs. Auth continues to pend.   10:15am- Confirmed bed available at [facility] and that they can manage drain care. Will initiate insurance authorization.  Westley Hummer, MSW, [place] Work [phone]

## 2018-02-17 NOTE — Progress Notes (Addendum)
[facility]  PROGRESS NOTE  Anthony Dunlap:[id number] DOB: [birth year] DOA: 01/17/2018 PCP: [name], MD   Brief HPI:   84 year old male with a history of hypothyroidism, insulin-dependent diabetes mellitus, hyperlipidemia, prostate cancer, chronic Foley catheter came with sepsis and DKA.  Blood cultures grew Peptostreptococcus and Bacteroides.  Wound culture grew Enterococcus faecalis and Klebsiella pneumoniae.  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  IR was consulted and he underwent drain placement.  Also seen by ID who recommended 8 weeks of IV antibiotics.  DKA has resolved. Drain from left thigh was pulled out accidentally on 02/11/2018.  Follow-up MRI showed persistent new foci of abscess and improved but persistent abscess on the previous site.  IR was reconsulted and another drain was placed yesterday.    Subjective   Patient seen and examined, denies any complaints.  No chest pain or shortness of breath.   Assessment/Plan:     Bacteremia/sepsis/pelvic abscess-blood cultures grew Peptostreptococcus and Bacteroides.  Rectus sheath abscess, status post drain placement by Dr Kathlene Cote on 01/25/2018.  Thigh abscess-S/P Aspiration and drain placement anterior thigh abscess 01/29/18 by Dr. Laurence Ferrari ID was  following.  Plan is to continue IV Unasyn for total of 8 weeks. Echocardiogram was unrevealing for endocarditis. Leukocytosis improved . PICC line was also placed because his last blood cultures have been negative. 02/11/18: IR was planning to repeat CT when thigh drain has about 20 mL or less of output but thigh high drain found to be out accidentally. MRI of the femur showed small new abscess in the vastus medialis muscle on the right side, osseous edema in the pubic bones suggesting osteomyelitis or possible septic pubic symphysis.  Abscess in the left thigh has decreased to 50 cc.IR  requested for orthopedics consultation.  Dr Tawanna Solo  discussed extensively with orthopedics Dr. Rod Can.  He says that he is not a candidate for orthopedic intervention.  IR placed left anterior thigh drain on 02/14/2018  Diabetic ketoacidosis-resolved,   Diabetes mellitus type 2 -patient on sliding scale insulin with NovoLog.  Blood glucose is elevated.  Will increase the dose of Levemir to 12 units subcu daily.  Hypothyroidism-continue Synthroid  Prostate cancer-patient has chronic Foley catheter  Lower extremity edema-improved, echocardiogram showed EF 60%, grade 1 diastolic dysfunction.  Anemia-likely due to anemia of chronic disease,.  No hematochezia or melena.  Status post 1 unit PRBC transfusion on 02/04/2018.  FOBT is negative  Hypothyroidism-TSH elevated around 6.  Dose of levothyroxine changed to 50 mcg daily    CBG: Recent Labs  Lab 02/16/18 1227 02/16/18 1703 02/16/18 2114 02/17/18 0838 02/17/18 1213  GLUCAP 319* 183* 256* 143* 333*    CBC: Recent Labs  Lab 02/11/18 0356 02/12/18 0404 02/14/18 0318 02/15/18 0430  WBC 8.5 9.0 11.1* 13.0*  NEUTROABS 6.1 6.5 8.6* 10.3*  HGB 7.6* 7.8* 7.6* 7.6*  HCT 25.0* 26.5* 25.5* 25.6*  MCV 81.7 81.5 81.0 81.3  PLT 712* 767* 789* 831*    Basic Metabolic Panel: Recent Labs  Lab 02/11/18 0356 02/12/18 0404 02/14/18 0318 02/15/18 0430 02/17/18 0327  NA 132* 134* 132* 134*  --   K 3.0* 3.5 3.1* 3.6  --   CL 95* 97* 98 98  --   CO2 26 27 25 26   --   GLUCOSE 149* 209* 223* 167*  --   BUN 10 10 8 10   --   CREATININE 0.69 0.75 0.63  0.71 0.61  CALCIUM 7.5* 7.6* 7.8* 8.0*  --      DVT prophylaxis: SCDs  Code Status: Full code  Family Communication: No family at bedside  Disposition Plan: likely skilled nursing facility rehab once bed becomes available.   Consultants:  IR  ID  Procedures:  Drain placement per IR   Antibiotics:   Anti-infectives (From admission, onward)   Start      Dose/Rate Route Frequency Ordered Stop   01/25/18 1700  Ampicillin-Sulbactam (UNASYN) 3 g in sodium chloride 0.9 % 100 mL IVPB     3 g 200 mL/hr over 30 Minutes Intravenous Every 6 hours 01/25/18 1604     01/23/18 2200  vancomycin (VANCOCIN) IVPB 1000 mg/200 mL premix  Status:  Discontinued     1,000 mg 200 mL/hr over 60 Minutes Intravenous Every 24 hours 01/23/18 0817 01/24/18 1002   01/23/18 0100  piperacillin-tazobactam (ZOSYN) IVPB 3.375 g  Status:  Discontinued     3.375 g 12.5 mL/hr over 240 Minutes Intravenous Every 8 hours 01/23/18 0057 01/25/18 1549   01/23/18 0100  vancomycin (VANCOCIN) IVPB 1000 mg/200 mL premix     1,000 mg 200 mL/hr over 60 Minutes Intravenous  Once 01/23/18 0057 01/23/18 0214   01/22/18 1630  meropenem (MERREM) 1 g in sodium chloride 0.9 % 100 mL IVPB  Status:  Discontinued     1 g 200 mL/hr over 30 Minutes Intravenous Every 8 hours 01/22/18 1625 01/22/18 1626   01/22/18 1630  metroNIDAZOLE (FLAGYL) IVPB 500 mg  Status:  Discontinued     500 mg 100 mL/hr over 60 Minutes Intravenous Every 8 hours 01/22/18 1626 01/23/18 0028   01/21/18 1200  penicillin G 3 million units in sodium chloride 0.9% 100 mL IVPB  Status:  Discontinued     3 Million Units 200 mL/hr over 30 Minutes Intravenous Every 4 hours 01/21/18 1050 01/21/18 1100   01/21/18 1200  penicillin G potassium 3 Million Units in dextrose 14mL IVPB  Status:  Discontinued     3 Million Units 100 mL/hr over 30 Minutes Intravenous Every 4 hours 01/21/18 1100 01/21/18 1106   01/21/18 1200  penicillin G 3 million units in sodium chloride 0.9% 100 mL IVPB  Status:  Discontinued     3 Million Units 200 mL/hr over 30 Minutes Intravenous Every 4 hours 01/21/18 1106 01/22/18 1625   01/19/18 1400  ceFAZolin (ANCEF) IVPB 1 g/50 mL premix  Status:  Discontinued     1 g 100 mL/hr over 30 Minutes Intravenous Every 8 hours 01/19/18 1104 01/21/18 1050   01/17/18 1200  ceFEPIme (MAXIPIME) 1 g in sodium chloride 0.9 % 100  mL IVPB  Status:  Discontinued     1 g 200 mL/hr over 30 Minutes Intravenous Every 12 hours 01/17/18 0741 01/19/18 1104   01/17/18 0230  ceFEPIme (MAXIPIME) 2 g in sodium chloride 0.9 % 100 mL IVPB     2 g 200 mL/hr over 30 Minutes Intravenous  Once 01/17/18 0224 01/17/18 0400       Objective   Vitals:   02/16/18 1426 02/16/18 2118 02/17/18 0553 02/17/18 1423  BP: (!) 146/88 (!) 142/81 (!) 151/87 (!) 120/96  Pulse: (!) 109 (!) 115 (!) 108 (!) 119  Resp: 16 18 19 18   Temp: 98.8 F (37.1 C) 100 F (37.8 C) 98.6 F (37 C) 97.7 F (36.5 C)  TempSrc: Oral Oral Oral Oral  SpO2: 99% 96% 100% 98%  Weight:  Height:        Intake/Output Summary (Last 24 hours) at 02/17/2018 1500 Last data filed at 02/17/2018 1218 Gross per 24 hour  Intake 330 ml  Output 1875 ml  Net -1545 ml   Filed Weights   01/17/18 0209 01/17/18 0600 01/23/18 2337  Weight: 70.3 kg 67.1 kg 72.9 kg     Physical Examination:     General: Lying comfortably  Cardiovascular: S1-S2, regular, no murmur auscultated  Respiratory: No wheezing or crackles auscultated on auscultation of lungs  Abdomen: Soft, nontender, no organomegaly  Musculoskeletal: Left anterior thigh drain in place      Data Reviewed: I have personally reviewed following labs and imaging studies   Recent Results (from the past 240 hour(s))  Gastrointestinal Panel by PCR , Stool     Status: None   Collection Time: 02/08/18 10:36 PM  Result Value Ref Range Status   Campylobacter species NOT DETECTED NOT DETECTED Final   Plesimonas shigelloides NOT DETECTED NOT DETECTED Final   Salmonella species NOT DETECTED NOT DETECTED Final   Yersinia enterocolitica NOT DETECTED NOT DETECTED Final   Vibrio species NOT DETECTED NOT DETECTED Final   Vibrio cholerae NOT DETECTED NOT DETECTED Final   Enteroaggregative E coli (EAEC) NOT DETECTED NOT DETECTED Final   Enteropathogenic E coli (EPEC) NOT DETECTED NOT DETECTED Final    Enterotoxigenic E coli (ETEC) NOT DETECTED NOT DETECTED Final   Shiga like toxin producing E coli (STEC) NOT DETECTED NOT DETECTED Final   Shigella/Enteroinvasive E coli (EIEC) NOT DETECTED NOT DETECTED Final   Cryptosporidium NOT DETECTED NOT DETECTED Final   Cyclospora cayetanensis NOT DETECTED NOT DETECTED Final   Entamoeba histolytica NOT DETECTED NOT DETECTED Final   Giardia lamblia NOT DETECTED NOT DETECTED Final   Adenovirus F40/41 NOT DETECTED NOT DETECTED Final   Astrovirus NOT DETECTED NOT DETECTED Final   Norovirus GI/GII NOT DETECTED NOT DETECTED Final   Rotavirus A NOT DETECTED NOT DETECTED Final   Sapovirus (I, II, IV, and V) NOT DETECTED NOT DETECTED Final    Comment: Performed at [facility], [address] [place], [place] [address]  C difficile quick scan w PCR reflex     Status: None   Collection Time: 02/10/18  1:14 PM  Result Value Ref Range Status   C Diff antigen NEGATIVE NEGATIVE Final   C Diff toxin NEGATIVE NEGATIVE Final   C Diff interpretation No C. difficile detected.  Final    Comment: Performed at [facility], [place] [address], [place], [place] [address]       Studies: No results found.  Scheduled Meds: . sodium chloride   Intravenous Once  . acidophilus  1 capsule Oral Daily  . aspirin  81 mg Oral Daily  . feeding supplement (PRO-STAT SUGAR FREE 64)  30 mL Oral BID  . ferrous sulfate  325 mg Oral Q breakfast  . insulin aspart  0-9 Units Subcutaneous TID WC  . insulin detemir  8 Units Subcutaneous QHS  . levothyroxine  50 mcg Oral Q0600  . magnesium oxide  400 mg Oral Daily  . multivitamin with minerals  1 tablet Oral Daily  . nutrition supplement (JUVEN)  1 packet Oral BID BM  . potassium chloride  40 mEq Oral Daily  . povidone-iodine  2 application Topical Once  . pravastatin  10 mg Oral q1800  . psyllium  1 packet Oral Daily  . sodium chloride flush  10-40 mL Intracatheter Q12H  . sodium  chloride flush  5 mL Intracatheter  Q8H  . sodium chloride flush  5 mL Intracatheter Q8H    Admission status: Inpatient: Based on patients clinical presentation and evaluation of above clinical data, I have made determination that patient meets Inpatient criteria at this time.  Time spent: 20 min  [facility] Pager [phone]. If 7PM-7AM, please contact night-coverage at [email or web address], Office  [phone]  password [email or web address]  02/17/2018, 3:00 PM  LOS: 31 days

## 2018-02-17 NOTE — Progress Notes (Signed)
Physical Therapy Treatment Patient Details Name: Anthony Dunlap MRN: [id number] DOB: 1933-12-16 Today's Date: 02/17/2018    History of Present Illness 84 y.o. male with medical history significant for hypothyroidism, insulin-dependent diabetes mellitus, hyperlipidemia, and history of prostate cancer with chronic Foley catheter, now presenting to the emergency department for evaluation of rigors.  Patient reports that he was in his usual state of health until overnight when he began sweating and developed shaking chills.  In the ED, pt was febrile with HR in 140s.  He also had serum glucose of 440 with anion gap of 12.  Pt admitted with sepsis and CT revealed muliple intramuscular abscesses of the abdomen and pelvis.  Pt is s/p drainage placement on 12/7 and drainage placement on 12/11.     PT Comments    Patient seen for mobility progression. Pt is making progress toward PT goals and tolerated short distance gait training. Pt's progress continues to be limited somewhat by incontinence of stool when in standing. Pt requires mod A +2 for all mobility this session. Continue to progress as tolerated with anticipated d/c to SNF for further skilled PT services.      Follow Up Recommendations  SNF;Supervision/Assistance - 24 hour     Equipment Recommendations  None recommended by PT    Recommendations for Other Services OT consult     Precautions / Restrictions Precautions Precautions: Fall Precaution Comments: 2 jackson prats drain in place     Mobility  Bed Mobility Overal bed mobility: Needs Assistance Bed Mobility: Rolling;Sidelying to Sit Rolling: Min assist Sidelying to sit: Mod assist       General bed mobility comments: pt rolled R and L for donning briefs prior to transfer with cues for assistance and use of rails; assist to bring bilat LE from EOB and elevate trunk into sitting   Transfers Overall transfer level: Needs assistance Equipment used: Rolling walker (2  wheeled) Transfers: Sit to/from Stand Sit to Stand: +2 physical assistance;Mod assist         General transfer comment: assist to power up into standing from EOB and recliner; cues for safe hand placement and positioning prior to standing; pt incontinent of stool with standing  Ambulation/Gait Ambulation/Gait assistance: Mod assist;+2 safety/equipment Gait Distance (Feet): (~4 ft total) Assistive device: Rolling walker (2 wheeled) Gait Pattern/deviations: Step-through pattern;Decreased step length - right;Decreased step length - left;Decreased dorsiflexion - right;Decreased dorsiflexion - left;Trunk flexed     General Gait Details: multimodal cues for posture and vc for sequencing and safe use of AD; assistance to guide RW and for balance   Stairs             Wheelchair Mobility    Modified Rankin (Stroke Patients Only)       Balance Overall balance assessment: Needs assistance Sitting-balance support: Bilateral upper extremity supported Sitting balance-Leahy Scale: Poor     Standing balance support: During functional activity;Bilateral upper extremity supported Standing balance-Leahy Scale: Poor                              Cognition Arousal/Alertness: Awake/alert Behavior During Therapy: WFL for tasks assessed/performed Overall Cognitive Status: Within Functional Limits for tasks assessed                                        Exercises      General  Comments        Pertinent Vitals/Pain Pain Assessment: Faces Faces Pain Scale: Hurts a little bit Pain Location: trunk and bilat LE with flexion Pain Descriptors / Indicators: Grimacing;Guarding Pain Intervention(s): Limited activity within patient's tolerance;Repositioned;Monitored during session    Home Living                      Prior Function            PT Goals (current goals can now be found in the care plan section) Acute Rehab PT Goals Patient Stated  Goal: return home with family to assist Progress towards PT goals: Progressing toward goals    Frequency    Min 3X/week      PT Plan Current plan remains appropriate    Co-evaluation              AM-PAC PT "6 Clicks" Mobility   Outcome Measure  Help needed turning from your back to your side while in a flat bed without using bedrails?: A Lot Help needed moving from lying on your back to sitting on the side of a flat bed without using bedrails?: A Lot Help needed moving to and from a bed to a chair (including a wheelchair)?: A Lot Help needed standing up from a chair using your arms (e.g., wheelchair or bedside chair)?: A Lot Help needed to walk in hospital room?: A Lot Help needed climbing 3-5 steps with a railing? : Total 6 Click Score: 11    End of Session Equipment Utilized During Treatment: Gait belt Activity Tolerance: Patient tolerated treatment well Patient left: in chair;with call bell/phone within reach;with chair alarm set Nurse Communication: Mobility status PT Visit Diagnosis: Unsteadiness on feet (R26.81);Other abnormalities of gait and mobility (R26.89);Muscle weakness (generalized) (M62.81)     Time: 1000-1032 PT Time Calculation (min) (ACUTE ONLY): 32 min  Charges:  $Gait Training: 8-22 mins $Therapeutic Activity: 8-22 mins                     Earney Navy, PTA Acute Rehabilitation Services Pager: [phone] Office: [phone]     Darliss Cheney 02/17/2018, 1:51 PM

## 2018-02-17 NOTE — Progress Notes (Signed)
INFECTIOUS DISEASE PROGRESS NOTE  ID: Anthony Dunlap is a 84 y.o. male with  Principal Problem:   Enterococcus faecalis infection Active Problems:   Sepsis secondary to UTI ([place])   Hypothyroidism   DKA (diabetic ketoacidoses) ([place])   Normocytic anemia   Sepsis due to Bacteroides species ([place])   Sepsis without acute organ dysfunction ([name])   Psoas abscess ([name])   Abscess   Pressure injury of skin  Subjective: No complaints, no rash, no diarrhea.   Abtx:  Anti-infectives (From admission, onward)   Start     Dose/Rate Route Frequency Ordered Stop   01/25/18 1700  Ampicillin-Sulbactam (UNASYN) 3 g in sodium chloride 0.9 % 100 mL IVPB     3 g 200 mL/hr over 30 Minutes Intravenous Every 6 hours 01/25/18 1604     01/23/18 2200  vancomycin (VANCOCIN) IVPB 1000 mg/200 mL premix  Status:  Discontinued     1,000 mg 200 mL/hr over 60 Minutes Intravenous Every 24 hours 01/23/18 0817 01/24/18 1002   01/23/18 0100  piperacillin-tazobactam (ZOSYN) IVPB 3.375 g  Status:  Discontinued     3.375 g 12.5 mL/hr over 240 Minutes Intravenous Every 8 hours 01/23/18 0057 01/25/18 1549   01/23/18 0100  vancomycin (VANCOCIN) IVPB 1000 mg/200 mL premix     1,000 mg 200 mL/hr over 60 Minutes Intravenous  Once 01/23/18 0057 01/23/18 0214   01/22/18 1630  meropenem (MERREM) 1 g in sodium chloride 0.9 % 100 mL IVPB  Status:  Discontinued     1 g 200 mL/hr over 30 Minutes Intravenous Every 8 hours 01/22/18 1625 01/22/18 1626   01/22/18 1630  metroNIDAZOLE (FLAGYL) IVPB 500 mg  Status:  Discontinued     500 mg 100 mL/hr over 60 Minutes Intravenous Every 8 hours 01/22/18 1626 01/23/18 0028   01/21/18 1200  penicillin G 3 million units in sodium chloride 0.9% 100 mL IVPB  Status:  Discontinued     3 Million Units 200 mL/hr over 30 Minutes Intravenous Every 4 hours 01/21/18 1050 01/21/18 1100   01/21/18 1200  penicillin G potassium 3 Million Units in dextrose 18mL IVPB  Status:  Discontinued     3 Million  Units 100 mL/hr over 30 Minutes Intravenous Every 4 hours 01/21/18 1100 01/21/18 1106   01/21/18 1200  penicillin G 3 million units in sodium chloride 0.9% 100 mL IVPB  Status:  Discontinued     3 Million Units 200 mL/hr over 30 Minutes Intravenous Every 4 hours 01/21/18 1106 01/22/18 1625   01/19/18 1400  ceFAZolin (ANCEF) IVPB 1 g/50 mL premix  Status:  Discontinued     1 g 100 mL/hr over 30 Minutes Intravenous Every 8 hours 01/19/18 1104 01/21/18 1050   01/17/18 1200  ceFEPIme (MAXIPIME) 1 g in sodium chloride 0.9 % 100 mL IVPB  Status:  Discontinued     1 g 200 mL/hr over 30 Minutes Intravenous Every 12 hours 01/17/18 0741 01/19/18 1104   01/17/18 0230  ceFEPIme (MAXIPIME) 2 g in sodium chloride 0.9 % 100 mL IVPB     2 g 200 mL/hr over 30 Minutes Intravenous  Once 01/17/18 0224 01/17/18 0400      Medications:  Scheduled: . sodium chloride   Intravenous Once  . acidophilus  1 capsule Oral Daily  . aspirin  81 mg Oral Daily  . feeding supplement (PRO-STAT SUGAR FREE 64)  30 mL Oral BID  . ferrous sulfate  325 mg Oral Q breakfast  . insulin  aspart  0-9 Units Subcutaneous TID WC  . insulin detemir  12 Units Subcutaneous QHS  . levothyroxine  50 mcg Oral Q0600  . magnesium oxide  400 mg Oral Daily  . multivitamin with minerals  1 tablet Oral Daily  . nutrition supplement (JUVEN)  1 packet Oral BID BM  . potassium chloride  40 mEq Oral Daily  . povidone-iodine  2 application Topical Once  . pravastatin  10 mg Oral q1800  . psyllium  1 packet Oral Daily  . sodium chloride flush  10-40 mL Intracatheter Q12H  . sodium chloride flush  5 mL Intracatheter Q8H  . sodium chloride flush  5 mL Intracatheter Q8H    Objective: Vital signs in last 24 hours: Temp:  [97.7 F (36.5 C)-100 F (37.8 C)] 97.7 F (36.5 C) (12/30 1423) Pulse Rate:  [108-119] 119 (12/30 1423) Resp:  [18-19] 18 (12/30 1423) BP: (120-151)/(81-96) 120/96 (12/30 1423) SpO2:  [96 %-100 %] 98 % (12/30  1423)   Incision/Wound: drain in L thigh, R pelvis.   Lab Results Recent Labs    02/15/18 0430 02/17/18 0327  WBC 13.0*  --   HGB 7.6*  --   HCT 25.6*  --   NA 134*  --   K 3.6  --   CL 98  --   CO2 26  --   BUN 10  --   CREATININE 0.71 0.61   Liver Panel No results for input(s): PROT, ALBUMIN, AST, ALT, ALKPHOS, BILITOT, BILIDIR, IBILI in the last 72 hours. Sedimentation Rate No results for input(s): ESRSEDRATE in the last 72 hours. C-Reactive Protein No results for input(s): CRP in the last 72 hours.  Microbiology: Recent Results (from the past 240 hour(s))  Gastrointestinal Panel by PCR , Stool     Status: None   Collection Time: 02/08/18 10:36 PM  Result Value Ref Range Status   Campylobacter species NOT DETECTED NOT DETECTED Final   Plesimonas shigelloides NOT DETECTED NOT DETECTED Final   Salmonella species NOT DETECTED NOT DETECTED Final   Yersinia enterocolitica NOT DETECTED NOT DETECTED Final   Vibrio species NOT DETECTED NOT DETECTED Final   Vibrio cholerae NOT DETECTED NOT DETECTED Final   Enteroaggregative E coli (EAEC) NOT DETECTED NOT DETECTED Final   Enteropathogenic E coli (EPEC) NOT DETECTED NOT DETECTED Final   Enterotoxigenic E coli (ETEC) NOT DETECTED NOT DETECTED Final   Shiga like toxin producing E coli (STEC) NOT DETECTED NOT DETECTED Final   Shigella/Enteroinvasive E coli (EIEC) NOT DETECTED NOT DETECTED Final   Cryptosporidium NOT DETECTED NOT DETECTED Final   Cyclospora cayetanensis NOT DETECTED NOT DETECTED Final   Entamoeba histolytica NOT DETECTED NOT DETECTED Final   Giardia lamblia NOT DETECTED NOT DETECTED Final   Adenovirus F40/41 NOT DETECTED NOT DETECTED Final   Astrovirus NOT DETECTED NOT DETECTED Final   Norovirus GI/GII NOT DETECTED NOT DETECTED Final   Rotavirus A NOT DETECTED NOT DETECTED Final   Sapovirus (I, II, IV, and V) NOT DETECTED NOT DETECTED Final    Comment: Performed at [facility], [address]  [place],  [id number]  C difficile quick scan w PCR reflex     Status: None   Collection Time: 02/10/18  1:14 PM  Result Value Ref Range Status   C Diff antigen NEGATIVE NEGATIVE Final   C Diff toxin NEGATIVE NEGATIVE Final   C Diff interpretation No C. difficile detected.  Final    Comment: Performed at [facility]  Lab, [address], [place], [place] [address]    Studies/Results: No results found.   Assessment/Plan: DM, recent DKA Thigh abscesses- new L drain placed Pelvic abscess Peptostreptococcus, Bacteroides, Enterococcus  Total days of antibiotics: 24/42 unasyn  He appears to be doing well.  Continue unasyn.  Will follow sporadically.          Bobby Rumpf MD, FACP Infectious Diseases (pager) [phone] [email or web address] 02/17/2018, 4:03 PM  LOS: 31 days

## 2018-02-17 NOTE — Progress Notes (Signed)
Inpatient Diabetes Program Recommendations  AACE/ADA: New Consensus Statement on Inpatient Glycemic Control (2015)  Target Ranges:  Prepandial:   less than 140 mg/dL      Peak postprandial:   less than 180 mg/dL (1-2 hours)      Critically ill patients:  140 - 180 mg/dL   Lab Results  Component Value Date   GLUCAP 143 (H) 02/17/2018   HGBA1C 9.6 (H) 01/04/2018    Review of Glycemic Control Results for Anthony Dunlap, Anthony Dunlap (MRN [id number]) as of 02/17/2018 11:47  Ref. Range 02/16/2018 07:49 02/16/2018 12:27 02/16/2018 17:03 02/16/2018 21:14 02/17/2018 08:38  Glucose-Capillary Latest Ref Range: 70 - 99 mg/dL 106 (H) 319 (H) 183 (H) 256 (H) 143 (H)   Diabetes history: DM 2  Inpatient Diabetes Program Recommendations: Please consider adding Novolog meal coverage 2 units tid with meals (hold if patient eats less than 50%).   Thank you, Nani Gasser. Mickael Mcnutt, RN, MSN, CDE  Diabetes Coordinator Inpatient Glycemic Control Team Team Pager [phone] (8am-5pm) 02/17/2018 11:47 AM

## 2018-02-18 LAB — GLUCOSE, CAPILLARY
GLUCOSE-CAPILLARY: 335 mg/dL — AB (ref 70–99)
Glucose-Capillary: 199 mg/dL — ABNORMAL HIGH (ref 70–99)
Glucose-Capillary: 317 mg/dL — ABNORMAL HIGH (ref 70–99)

## 2018-02-18 MED ORDER — JUVEN PO PACK: 1.0000 | PACK | Freq: Two times a day (BID) | ORAL | 0 refills | Status: DC

## 2018-02-18 MED ORDER — FERROUS SULFATE 325 (65 FE) MG PO TABS: 325.0000 mg | ORAL_TABLET | Freq: Every day | ORAL | 3 refills | Status: DC

## 2018-02-18 MED ORDER — AMPICILLIN-SULBACTAM IV (FOR PTA / DISCHARGE USE ONLY): 3.0000 g | Freq: Four times a day (QID) | INTRAVENOUS | 0 refills | Status: DC

## 2018-02-18 MED ORDER — ACETAMINOPHEN 325 MG PO TABS: 650.0000 mg | ORAL_TABLET | Freq: Four times a day (QID) | ORAL | Status: DC | PRN

## 2018-02-18 MED ORDER — HEPARIN SOD (PORK) LOCK FLUSH 100 UNIT/ML IV SOLN
250.0000 [IU] | INTRAVENOUS | Status: AC | PRN
  Administered 2018-02-18: 250 [IU]

## 2018-02-18 MED ORDER — INSULIN ASPART 100 UNIT/ML ~~LOC~~ SOLN: 0.0000 [IU] | Freq: Three times a day (TID) | SUBCUTANEOUS | 11 refills | Status: AC

## 2018-02-18 MED ORDER — LEVEMIR FLEXTOUCH 100 UNIT/ML ~~LOC~~ SOPN: 20.0000 [IU] | PEN_INJECTOR | Freq: Every day | SUBCUTANEOUS | 3 refills | Status: DC

## 2018-02-18 MED ORDER — LEVOTHYROXINE SODIUM 50 MCG PO TABS: 50.0000 ug | ORAL_TABLET | Freq: Every day | ORAL | Status: DC

## 2018-02-18 MED ORDER — MAGNESIUM OXIDE 400 (241.3 MG) MG PO TABS: 400.0000 mg | ORAL_TABLET | Freq: Every day | ORAL | Status: DC

## 2018-02-18 MED ORDER — ADULT MULTIVITAMIN W/MINERALS CH: 1.0000 | ORAL_TABLET | Freq: Every day | ORAL | Status: DC

## 2018-02-18 NOTE — Progress Notes (Signed)
Referring Physician(s): Dr. Elmer Bales. Zigmund Daniel  Supervising Physician: Markus Daft  Patient Status:  [facility] - In-pt  Chief Complaint: Follow up right rectus sheath abscess drain placed 12/7 by Dr. Kathlene Cote and left thigh abscess drain placed 12/11 by Dr. Laurence Ferrari and replaced 12/27 by Dr. Anselm Pancoast  Subjective:  Patient sitting up in bed eating lunch, denies any complaints. States appetite is good, denies pain where drains are.   Allergies: Patient has no known allergies.  Medications: Prior to Admission medications   Medication Sig Start Date End Date Taking? Authorizing Provider  aspirin EC 81 MG tablet Take 81 mg by mouth daily.   Yes [provider]  LEVEMIR FLEXTOUCH 100 UNIT/ML Pen Inject 30 Units into the skin daily. 01/07/18  Yes Kathie Dike, MD  levothyroxine (SYNTHROID, LEVOTHROID) 25 MCG tablet Take 25 mcg by mouth daily before breakfast.   Yes [provider]  pravastatin (PRAVACHOL) 10 MG tablet Take 10 mg by mouth daily.   Yes [provider]     Vital Signs: BP (!) 148/94 (BP Location: Left Arm)   Pulse 94   Temp 97.6 F (36.4 C) (Oral)   Resp 18   Ht 5\' 8"  (1.727 m)   Wt 160 lb 11.5 oz (72.9 kg)   SpO2 97%   BMI 24.44 kg/m   Physical Exam Vitals signs and nursing note reviewed.  Constitutional:      General: He is not in acute distress.    Appearance: He is not diaphoretic.  HENT:     Head: Normocephalic and atraumatic.  Cardiovascular:     Rate and Rhythm: Normal rate.  Pulmonary:     Effort: Pulmonary effort is normal.  Abdominal:     Palpations: Abdomen is soft.     Tenderness: There is no abdominal tenderness.     Comments: (+) right abdominal drain to JP with ~ 5 cc clear serous OP; insertion site is clean, dry intact, dressed appropriately without discharge or bleeding.  Musculoskeletal:     Comments: (+) anterior left thigh drain to JP; ~5 cc clear serous OP. Insertion site clean, dry, intact, dressed  appropriately without discharge or bleeding.  Skin:    General: Skin is warm and dry.  Neurological:     Mental Status: He is alert.     Imaging: US Guided Needle Placement  Result Date: 02/14/2018 INDICATION: 84 year old with multiple abscesses in the left thigh and pelvic region. Previous left anterior thigh abscess drain was inadvertently pulled out. Recent MRI demonstrates residual fluid collections. Plan for placement of another drain in the anterior thigh collection. EXAM: PLACEMENT OF LEFT THIGH DRAIN USING ULTRASOUND GUIDANCE MEDICATIONS: Sedation medications ANESTHESIA/SEDATION: Fentanyl 50 mcg IV; Versed 1.0 mg IV Moderate Sedation Time:  31 minutes The patient was continuously monitored during the procedure by the interventional radiology nurse under my direct supervision. COMPLICATIONS: None immediate. PROCEDURE: Informed written consent was obtained from the patient after a thorough discussion of the procedural risks, benefits and alternatives. All questions were addressed. Maximal Sterile Barrier Technique was utilized including caps, mask, sterile gowns, sterile gloves, sterile drape, hand hygiene and skin antiseptic. A timeout was performed prior to the initiation of the procedure. The left thigh was evaluated with ultrasound. Fluid collection in the anterior left thigh musculature was identified and this represents the site of the previous drain. Recent MRI raised concern for a new fluid collection along the medial left thigh. No discrete fluid collection identified in the medial left thigh. The anterior  left thigh was prepped with chlorhexidine and sterile field was created. Skin was anesthetized with 1% lidocaine. 18 gauge trocar needle directed into the anterior left thigh fluid collection with ultrasound guidance and a small amount of cloudy fluid was aspirated. Stiff Amplatz wire was advanced into the collection. The tract was dilated to accommodate a 10 [place] multipurpose drain.  Small amount of additional cloudy fluid was removed. The collection was decompressed. Drain was sutured to skin and attached to a suction bulb. FINDINGS: Small amount of fluid in the anterior left thigh soft tissues. Approximately 7 mL of cloudy fluid was removed from this collection. The collection was decompressed with a drain at the end of the procedure. The medial left thigh was evaluated with ultrasound to look for a discrete fluid collection. There is subcutaneous edema in this area but no discrete fluid collection. IMPRESSION: Successful ultrasound-guided placement of a drain within the small anterior left thigh fluid collection. Electronically Signed   By: Markus Daft M.D.   On: 02/14/2018 17:19    Labs:  CBC: Recent Labs    02/11/18 0356 02/12/18 0404 02/14/18 0318 02/15/18 0430  WBC 8.5 9.0 11.1* 13.0*  HGB 7.6* 7.8* 7.6* 7.6*  HCT 25.0* 26.5* 25.5* 25.6*  PLT 712* 767* 789* 831*    COAGS: Recent Labs    01/03/18 1929 01/17/18 0443 01/25/18 0348 02/14/18 0318  INR 1.20 1.31 1.15 1.23  APTT  --  37*  --   --     BMP: Recent Labs    02/11/18 0356 02/12/18 0404 02/14/18 0318 02/15/18 0430 02/17/18 0327  NA 132* 134* 132* 134*  --   K 3.0* 3.5 3.1* 3.6  --   CL 95* 97* 98 98  --   CO2 26 27 25 26   --   GLUCOSE 149* 209* 223* 167*  --   BUN 10 10 8 10   --   CALCIUM 7.5* 7.6* 7.8* 8.0*  --   CREATININE 0.69 0.75 0.63 0.71 0.61  GFRNONAA >60 >60 >60 >60 >60  GFRAA >60 >60 >60 >60 >60    LIVER FUNCTION TESTS: Recent Labs    01/05/18 0551 01/17/18 0237 01/30/18 0225 01/31/18 1041  BILITOT 0.3 0.7 0.3 0.8  AST 21 19 38 56*  ALT 19 15 31 31   ALKPHOS 141* 152* 249* 330*  PROT 7.1 7.2 5.7* 6.4*  ALBUMIN 2.6* 2.5* 1.4* 1.6*    Assessment and Plan:  S/p right rectus sheath abscess drain placed 12/7 and left thigh abscess drain placed 12/11, replaced 12/27 after dislodgement. OP remains low from both drains per I/O - left thigh drain with 10 cc last 24  hours, right rectus sheath with 5 cc last 24 hours. Patient denies complaints, planned for d/c to SNF today per floor staff.  [place] drain card was left in patient's chart today with instructions for drain care upon discharge - please make sure this is sent with patient. Instructions are as follows:  1. Flush each drain once daily with 5 cc NS 2. Record output from each drain once daily 3. Dressing changes every other day or when soiled 4. IR scheduler will call patient's responsible party to arrange outpatient IR clinic follow up for repeat imaging/possible drain injection/possible drain removal - the appointment will be about a week after discharge.   Please call IR with questions or concerns.   Electronically Signed: Joaquim Nam, PA-C 02/18/2018, 12:17 PM   I spent a total of 25 Minutes at the the  patient's bedside AND on the patient's hospital floor or unit, greater than 50% of which was counseling/coordinating care for follow up of right rectus sheath abscess drain and left thigh abscess drain.

## 2018-02-18 NOTE — Plan of Care (Signed)
  Problem: Coping: Goal: Level of anxiety will decrease 02/18/2018 0657 by Westly Pam, RN Outcome: Progressing 02/18/2018 0656 by Westly Pam, RN Outcome: Progressing   Problem: Pain Managment: Goal: General experience of comfort will improve Outcome: Progressing

## 2018-02-18 NOTE — Clinical Social Work Placement (Addendum)
   CLINICAL SOCIAL WORK PLACEMENT  NOTE  [facility]  Date:  02/18/2018  Patient Details  Name: Anthony Dunlap MRN: [id number] Date of Birth: Feb 12, 1934  Clinical Social Work is seeking post-discharge placement for this patient at the [place] level of care (*CSW will initial, date and re-position this form in  chart as items are completed):  Yes   Patient/family provided with [place] [facility] Work Department's list of facilities offering this level of care within the geographic area requested by the patient (or if unable, by the patient's family).  Yes   Patient/family informed of their freedom to choose among providers that offer the needed level of care, that participate in Medicare, Medicaid or managed care program needed by the patient, have an available bed and are willing to accept the patient.  Yes   Patient/family informed of [facility]'s ownership interest in [facility] and [facility], as well as of the fact that they are under no obligation to receive care at these facilities.  PASRR submitted to EDS on       PASRR number received on       Existing PASRR number confirmed on 01/28/18     FL2 transmitted to all facilities in geographic area requested by pt/family on 01/28/18     FL2 transmitted to all facilities within larger geographic area on       Patient informed that his/her managed care company has contracts with or will negotiate with certain facilities, including the following:        Yes   Patient/family informed of bed offers received.  Patient chooses bed at [facility]     Physician recommends and patient chooses bed at      Patient to be transferred to [facility] on 02/18/18.  Patient to be transferred to facility by [facility]     Patient family notified on 02/18/18 of transfer.  Name of family member notified:  pr daughter [name]     PHYSICIAN Please prepare prescriptions, Please prepare  priority discharge summary, including medications     Additional Comment:    _______________________________________________ Alexander Mt, LCSWA 02/18/2018, 12:19 PM

## 2018-02-18 NOTE — Progress Notes (Signed)
INFECTIOUS DISEASE PROGRESS NOTE  ID: Anthony Dunlap is a 84 y.o. male with  Principal Problem:   Enterococcus faecalis infection Active Problems:   Sepsis secondary to UTI ([place])   Hypothyroidism   DKA (diabetic ketoacidoses) ([place])   Normocytic anemia   Sepsis due to Bacteroides species ([place])   Sepsis without acute organ dysfunction ([name])   Psoas abscess ([name])   Abscess   Pressure injury of skin   Abscess of left thigh  Subjective: Doing well. Getting ready to be discharged to SNF per his account. Minimal drain output.   Abtx:  Anti-infectives (From admission, onward)   Start     Dose/Rate Route Frequency Ordered Stop   02/18/18 0000  ampicillin-sulbactam (UNASYN) IVPB     3 g Intravenous Every 6 hours 02/18/18 1314     01/25/18 1700  Ampicillin-Sulbactam (UNASYN) 3 g in sodium chloride 0.9 % 100 mL IVPB     3 g 200 mL/hr over 30 Minutes Intravenous Every 6 hours 01/25/18 1604     01/23/18 2200  vancomycin (VANCOCIN) IVPB 1000 mg/200 mL premix  Status:  Discontinued     1,000 mg 200 mL/hr over 60 Minutes Intravenous Every 24 hours 01/23/18 0817 01/24/18 1002   01/23/18 0100  piperacillin-tazobactam (ZOSYN) IVPB 3.375 g  Status:  Discontinued     3.375 g 12.5 mL/hr over 240 Minutes Intravenous Every 8 hours 01/23/18 0057 01/25/18 1549   01/23/18 0100  vancomycin (VANCOCIN) IVPB 1000 mg/200 mL premix     1,000 mg 200 mL/hr over 60 Minutes Intravenous  Once 01/23/18 0057 01/23/18 0214   01/22/18 1630  meropenem (MERREM) 1 g in sodium chloride 0.9 % 100 mL IVPB  Status:  Discontinued     1 g 200 mL/hr over 30 Minutes Intravenous Every 8 hours 01/22/18 1625 01/22/18 1626   01/22/18 1630  metroNIDAZOLE (FLAGYL) IVPB 500 mg  Status:  Discontinued     500 mg 100 mL/hr over 60 Minutes Intravenous Every 8 hours 01/22/18 1626 01/23/18 0028   01/21/18 1200  penicillin G 3 million units in sodium chloride 0.9% 100 mL IVPB  Status:  Discontinued     3 Million Units 200 mL/hr over  30 Minutes Intravenous Every 4 hours 01/21/18 1050 01/21/18 1100   01/21/18 1200  penicillin G potassium 3 Million Units in dextrose 5m IVPB  Status:  Discontinued     3 Million Units 100 mL/hr over 30 Minutes Intravenous Every 4 hours 01/21/18 1100 01/21/18 1106   01/21/18 1200  penicillin G 3 million units in sodium chloride 0.9% 100 mL IVPB  Status:  Discontinued     3 Million Units 200 mL/hr over 30 Minutes Intravenous Every 4 hours 01/21/18 1106 01/22/18 1625   01/19/18 1400  ceFAZolin (ANCEF) IVPB 1 g/50 mL premix  Status:  Discontinued     1 g 100 mL/hr over 30 Minutes Intravenous Every 8 hours 01/19/18 1104 01/21/18 1050   01/17/18 1200  ceFEPIme (MAXIPIME) 1 g in sodium chloride 0.9 % 100 mL IVPB  Status:  Discontinued     1 g 200 mL/hr over 30 Minutes Intravenous Every 12 hours 01/17/18 0741 01/19/18 1104   01/17/18 0230  ceFEPIme (MAXIPIME) 2 g in sodium chloride 0.9 % 100 mL IVPB     2 g 200 mL/hr over 30 Minutes Intravenous  Once 01/17/18 0224 01/17/18 0400      Medications:  Scheduled: . sodium chloride   Intravenous Once  . acidophilus  1  capsule Oral Daily  . aspirin  81 mg Oral Daily  . feeding supplement (PRO-STAT SUGAR FREE 64)  30 mL Oral BID  . ferrous sulfate  325 mg Oral Q breakfast  . insulin aspart  0-15 Units Subcutaneous TID WC  . insulin detemir  12 Units Subcutaneous QHS  . levothyroxine  50 mcg Oral Q0600  . magnesium oxide  400 mg Oral Daily  . multivitamin with minerals  1 tablet Oral Daily  . nutrition supplement (JUVEN)  1 packet Oral BID BM  . potassium chloride  40 mEq Oral Daily  . povidone-iodine  2 application Topical Once  . pravastatin  10 mg Oral q1800  . psyllium  1 packet Oral Daily  . sodium chloride flush  10-40 mL Intracatheter Q12H  . sodium chloride flush  5 mL Intracatheter Q8H  . sodium chloride flush  5 mL Intracatheter Q8H    Objective: Vital signs in last 24 hours: Temp:  [97.6 F (36.4 C)-98.5 F (36.9 C)] 97.6 F  (36.4 C) (12/31 0525) Pulse Rate:  [94-108] 94 (12/31 0525) Resp:  [18] 18 (12/31 0525) BP: (142-148)/(84-94) 148/94 (12/31 0525) SpO2:  [96 %-97 %] 97 % (12/31 0525)   Incision/Wound: drain in L thigh, R pelvis.   Lab Results Recent Labs    02/17/18 0327  CREATININE 0.61   Liver Panel No results for input(s): PROT, ALBUMIN, AST, ALT, ALKPHOS, BILITOT, BILIDIR, IBILI in the last 72 hours. Sedimentation Rate No results for input(s): ESRSEDRATE in the last 72 hours. C-Reactive Protein No results for input(s): CRP in the last 72 hours.  Microbiology: Recent Results (from the past 240 hour(s))  Gastrointestinal Panel by PCR , Stool     Status: None   Collection Time: 02/08/18 10:36 PM  Result Value Ref Range Status   Campylobacter species NOT DETECTED NOT DETECTED Final   Plesimonas shigelloides NOT DETECTED NOT DETECTED Final   Salmonella species NOT DETECTED NOT DETECTED Final   Yersinia enterocolitica NOT DETECTED NOT DETECTED Final   Vibrio species NOT DETECTED NOT DETECTED Final   Vibrio cholerae NOT DETECTED NOT DETECTED Final   Enteroaggregative E coli (EAEC) NOT DETECTED NOT DETECTED Final   Enteropathogenic E coli (EPEC) NOT DETECTED NOT DETECTED Final   Enterotoxigenic E coli (ETEC) NOT DETECTED NOT DETECTED Final   Shiga like toxin producing E coli (STEC) NOT DETECTED NOT DETECTED Final   Shigella/Enteroinvasive E coli (EIEC) NOT DETECTED NOT DETECTED Final   Cryptosporidium NOT DETECTED NOT DETECTED Final   Cyclospora cayetanensis NOT DETECTED NOT DETECTED Final   Entamoeba histolytica NOT DETECTED NOT DETECTED Final   Giardia lamblia NOT DETECTED NOT DETECTED Final   Adenovirus F40/41 NOT DETECTED NOT DETECTED Final   Astrovirus NOT DETECTED NOT DETECTED Final   Norovirus GI/GII NOT DETECTED NOT DETECTED Final   Rotavirus A NOT DETECTED NOT DETECTED Final   Sapovirus (I, II, IV, and V) NOT DETECTED NOT DETECTED Final    Comment: Performed at [facility]  Lab, [address] [place], [place] [address]  C difficile quick scan w PCR reflex     Status: None   Collection Time: 02/10/18  1:14 PM  Result Value Ref Range Status   C Diff antigen NEGATIVE NEGATIVE Final   C Diff toxin NEGATIVE NEGATIVE Final   C Diff interpretation No C. difficile detected.  Final    Comment: Performed at [facility], [address] [place], [place] [address]    Studies/Results: No results   found. Sed Rate (mm/hr)  Date Value  01/25/2018 94 (H)   CRP  Date Value  01/25/2018 23.1 mg/dL (H)  08/19/2013 0.4 mg/L    Assessment/Plan: DM, recent DKA Thigh abscesses- new L drain replaced 12/27 Pelvic abscess Peptostreptococcus, Bacteroides, Enterococcus   Total days of antibiotics: 25 unasyn   OPAT ORDERS:  Diagnosis: Thigh/pelvic abscess and osteomyelitis   Culture Result: peptostreptococcus, bacteroides, enterococcus   No Known Allergies  Discharge antibiotics: Ampicillin-sulbactam 3 gm IV q6h   Duration: 8 weeks   End Date: 03/25/18  [facility] Care and Maintenance Per Protocol _x_ Please pull PIC at completion of IV antibiotics __ Please leave PIC in place until doctor has seen patient or been notified  Labs weekly while on IV antibiotics: _x_ CBC with differential _x_ BMP _x_ CRP _x_ ESR  Fax weekly labs to [phone]  Clinic Follow Up Appt: January 17th @ 8:45 am @ [place], MSN, NP-C [facility] for Infectious Disease [email or web address] Pager: [phone]

## 2018-02-18 NOTE — Social Work (Addendum)
1:40pm- updated PT/MD note sent to [facility], await confirmation.  12:15pm- Spoke with pt and pt daughter, they are aware discharge is today. Updated [employer] [facility] authorization for discharge date beginning today.  8:27am- CSW has received Josem Kaufmann for SNF placement, will inform MD.  Westley Hummer, MSW, [place] Work [phone]

## 2018-02-18 NOTE — Progress Notes (Signed)
Attempted to give report to [facility] at the number provided [phone]), no answer. Will attempt again before patient discharges

## 2018-02-18 NOTE — Social Work (Addendum)
Auth #[id number] 4 days Rug [place] 1; 558 mins  Clinical Social Worker facilitated patient discharge including contacting patient family and facility to confirm patient discharge plans.  Clinical information faxed to facility and family agreeable with plan.  CSW arranged ambulance transport via [facility] to [facility]. RN to call [phone] with report prior to discharge.  Clinical Social Worker will sign off for now as social work intervention is no longer needed. Please consult [place] again if new need arises.  Westley Hummer, MSW, [place] Social Worker [phone]

## 2018-02-18 NOTE — Progress Notes (Signed)
Inpatient Diabetes Program Recommendations  AACE/ADA: New Consensus Statement on Inpatient Glycemic Control (2015)  Target Ranges:  Prepandial:   less than 140 mg/dL      Peak postprandial:   less than 180 mg/dL (1-2 hours)      Critically ill patients:  140 - 180 mg/dL   Lab Results  Component Value Date   GLUCAP 199 (H) 02/18/2018   HGBA1C 9.6 (H) 01/04/2018    Review of Glycemic Control Results for [name], Anthony Dunlap (MRN [id number]) as of 02/18/2018 10:53  Ref. Range 02/17/2018 08:38 02/17/2018 12:13 02/17/2018 17:58 02/17/2018 21:01 02/17/2018 22:30 02/18/2018 09:01  Glucose-Capillary Latest Ref Range: 70 - 99 mg/dL 143 (H) 333 (H) 430 (H) 68 (L) 144 (H) 199 (H)   Current orders for Inpatient glycemic control: Levemir 12 units QHS, Novolog 0-15 units TID  Inpatient Diabetes Program Recommendations:  Noted CBG decreased to 68 post Novolog correction 15 units given.  -Increase Levemir to 15 units q hs -Add Novolog 3 units tid meal coverage if eats 50% -Decrease Novolog correction to sensitive tid   Thank you, Nani Gasser. Jhoel Stieg, RN, MSN, CDE  Diabetes Coordinator Inpatient Glycemic Control Team Team Pager [phone] (8am-5pm) 02/18/2018 10:55 AM

## 2018-02-18 NOTE — Discharge Summary (Addendum)
Physician Discharge Summary  Anthony Dunlap GGY:[id number] DOB: [birth year] DOA: 01/17/2018  PCP: [name], MD  Admit date: 01/17/2018 Discharge date: 02/18/2018  Time spent: 45 minutes  Recommendations for Outpatient Follow-up:   Recommendations per IR 1. Flush each drain once daily with 5 cc NS 2. Record output from each drain once daily 3. Dressing changes every other day or when soiled 4. IR scheduler will call patient's responsible party to arrange outpatient IR clinic follow up for repeat imaging/possible drain injection/possible drain removal - the appointment will be about a week after discharge.   Continue antibiotics IV Unasyn 3 g every 6 hours.  Last day of therapy 03/25/2018   Discharge Diagnoses:  Principal Problem:   Enterococcus faecalis infection Active Problems:   Sepsis secondary to UTI ([place])   Hypothyroidism   DKA (diabetic ketoacidoses) ([place])   Normocytic anemia   Sepsis due to Bacteroides species ([place])   Sepsis without acute organ dysfunction ([name])   Psoas abscess ([name])   Abscess   Pressure injury of skin   Abscess of left thigh   Discharge Condition: *Stable  Diet recommendation: Carb modified diet  Filed Weights   01/17/18 0209 01/17/18 0600 01/23/18 2337  Weight: 70.3 kg 67.1 kg 72.9 kg    History of present illness:  84 year old male with a history of hypothyroidism, insulin-dependent diabetes mellitus, hyperlipidemia, prostate cancer, chronic Foley catheter came with sepsis and DKA.  Blood cultures grew Peptostreptococcus and Bacteroides.  Wound culture grew Enterococcus faecalis and Klebsiella pneumoniae.  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  IR was consulted and he underwent drain placement.  Also seen by ID who recommended 8 weeks of IV antibiotics.  DKA has resolved. Drain from left thigh was pulled out accidentally on 02/11/2018.  Follow-up MRI showed  persistent new foci of abscess and improved but persistent abscess on the previous site.  IR was reconsulted and another drain was placed yesterday.  Hospital Course:   Bacteremia/sepsis/pelvic abscess-blood cultures grew Peptostreptococcus and Bacteroides.  Rectus sheath abscess, status post drain placement by Dr Kathlene Cote on 01/25/2018.  Thigh abscess-S/P Aspiration and drain placement anterior thigh abscess 01/29/18 by Dr. Ermelinda Das consulted. Plan is to continue IV Unasyn for total of 8 weeks.  Stop date 03/25/2018 Echocardiogram was unrevealing for endocarditis.  PICC linewas also placed because his last blood cultures have been negative. 02/11/18: IR was planning to repeat CT when thigh drain has about 20 mL or less of output but thigh high drain found to be out accidentally.  MRI of the femur showed small new abscess in the vastus medialis muscle on the right side, osseous edema in the pubic bones suggesting osteomyelitis or possible septic pubic symphysis. Abscess in the left thigh has decreased to 50 cc.IR requested for orthopedics consultation. Dr Tawanna Solo  discussed extensively with orthopedics Dr. Rod Can. He says that he is not a candidate for orthopedic intervention.    IR placed left anterior thigh drain on 02/14/2018    Diabetic ketoacidosis-resolved.  Diabetes mellitus type 2 -patient on sliding scale insulin with NovoLog.  Blood glucose is elevated.    Blood glucose has been labile in the hospital.  He takes Levemir 30 units subcu daily at home.  Will cut down the dose of Levemir to 20 units and continue with moderate sliding scale insulin with NovoLog.  Might have to adjust the dose of Levemir based on patient's blood  glucose.  He has a test for which she wants given 12.5 mg IV every 6 hours.   Prostate cancer-patient has chronic Foley catheter  Lower extremity edema-improved, echocardiogram showed EF 12%, grade 1 diastolic  dysfunction.  Anemia-likely due to anemia of chronic disease,.  No hematochezia or melena.  Status post 1 unit PRBC transfusion on 02/04/2018.  FOBT is negative  Hypothyroidism-TSH elevated around 6.  Dose of levothyroxine changed to 50 mcg daily   Procedures:  Thigh drain placement per IR  Consultations:  Infectious disease  Interventional radiology  Discharge Exam: Vitals:   02/17/18 2105 02/18/18 0525  BP: (!) 142/84 (!) 148/94  [place]: (!) 108 94  Resp: 18 18  Temp: 98.5 F (36.9 C) 97.6 F (36.4 C)  SpO2: 96% 97%    General: Appears in no acute distress Cardiovascular: S1-S2, regular, no murmur auscultated Respiratory: Clear to auscultation bilaterally  Discharge Instructions   Discharge Instructions    Diet - low sodium heart healthy   Complete by:  As directed    Discharge instructions   Complete by:  As directed    Instructions per IR 1. Flush each drain once daily with 5 cc NS 2. Record output from each drain once daily 3. Dressing changes every other day or when soiled 4. IR scheduler will call patient's responsible party to arrange outpatient IR clinic follow up for repeat imaging/possible drain injection/possible drain removal - the appointment will be about a week after discharge.     Home infusion instructions [facility] May follow [place] Dosing Protocol; May administer Cathflo as needed to maintain patency of vascular access device.; Flushing of vascular access device: per [facility] Protocol: 0.9% NaCl pre/post medica...   Complete by:  As directed    Instructions:  May follow [place] Dosing Protocol   Instructions:  May administer Cathflo as needed to maintain patency of vascular access device.   Instructions:  Flushing of vascular access device: per [facility] Protocol: 0.9% NaCl pre/post medication administration and prn patency; Heparin 100 u/ml, 46m for implanted ports and Heparin 10u/ml, 551mfor all other central venous catheters.    Instructions:  May follow [facility] Anaphylaxis Protocol for First Dose Administration in the home: 0.9% NaCl at 25-50 ml/hr to maintain IV access for protocol meds. Epinephrine 0.3 ml IV/IM PRN and Benadryl 25-50 IV/IM PRN s/s of anaphylaxis.   Instructions:  [facility] Coordinator (RN) to assist per patient IV care needs in the home PRN.   Increase activity slowly   Complete by:  As directed      Allergies as of 02/18/2018   No Known Allergies     Medication List    TAKE these medications   acetaminophen 325 MG tablet Commonly known as:  TYLENOL Take 2 tablets (650 mg total) by mouth every 6 (six) hours as needed for mild pain, fever or headache.   ampicillin-sulbactam  IVPB Commonly known as:  UNASYN Inject 3 g into the vein every 6 (six) hours. Indication:  abscesses and pelvic osteo  Last Day of Therapy:  03/25/2018 Labs - Once weekly:  CBC/D and BMP, Labs - Every other week:  ESR and CRP   aspirin EC 81 MG tablet Take 81 mg by mouth daily.   ferrous sulfate 325 (65 FE) MG tablet Take 1 tablet (325 mg total) by mouth daily with breakfast. Start taking on:  February 19, 2018   insulin aspart 100 UNIT/ML injection Commonly known as:  novoLOG Inject  0-15 Units into the skin 3 (three) times daily with meals. Sliding scale insulin Less than 70 initiate hypoglycemia protocol 70-120  0 units 120-150 2 unit 151-200 3 units 201-250 3 units 251-300 5 units 301-350 8 units 351-400 11 units  Greater than 400 15 units , call MD   LEVEMIR FLEXTOUCH 100 UNIT/ML Pen Generic drug:  Insulin Detemir Inject 20 Units into the skin daily. What changed:  how much to take   levothyroxine 50 MCG tablet Commonly known as:  SYNTHROID, LEVOTHROID Take 1 tablet (50 mcg total) by mouth daily at 6 (six) AM. Start taking on:  February 19, 2018 What changed:    medication strength  how much to take  when to take this   magnesium oxide 400 (241.3 Mg) MG tablet Commonly known as:   MAG-OX Take 1 tablet (400 mg total) by mouth daily. Start taking on:  February 19, 2018   multivitamin with minerals Tabs tablet Take 1 tablet by mouth daily. Start taking on:  February 19, 2018   nutrition supplement (JUVEN) Pack Take 1 packet by mouth 2 (two) times daily between meals. Start taking on:  February 19, 2018   pravastatin 10 MG tablet Commonly known as:  PRAVACHOL Take 10 mg by mouth daily.            Home Infusion Instuctions  (From admission, onward)         Start     Ordered   02/18/18 0000  Home infusion instructions [facility] May follow [place] Dosing Protocol; May administer Cathflo as needed to maintain patency of vascular access device.; Flushing of vascular access device: per [facility] Protocol: 0.9% NaCl pre/post medica...    Question Answer Comment  Instructions May follow [place] Dosing Protocol   Instructions May administer Cathflo as needed to maintain patency of vascular access device.   Instructions Flushing of vascular access device: per [facility] Protocol: 0.9% NaCl pre/post medication administration and prn patency; Heparin 100 u/ml, 37m for implanted ports and Heparin 10u/ml, 573mfor all other central venous catheters.   Instructions May follow [facility] Anaphylaxis Protocol for First Dose Administration in the home: 0.9% NaCl at 25-50 ml/hr to maintain IV access for protocol meds. Epinephrine 0.3 ml IV/IM PRN and Benadryl 25-50 IV/IM PRN s/s of anaphylaxis.   Instructions [facility] Infusion Coordinator (RN) to assist per patient IV care needs in the home PRN.      02/18/18 1314         No Known Allergies Follow-up Information    [facility] Follow up.   Why:  IR scheduler will call you with appointment date/time Contact information: [phone]          The results of significant diagnostics from this hospitalization (including imaging, microbiology,  ancillary and laboratory) are listed below for reference.    Significant Diagnostic Studies: UsKoreauided Needle Placement  Result Date: 02/14/2018 INDICATION: [age]ear old with multiple abscesses in the left thigh and pelvic region. Previous left anterior thigh abscess drain was inadvertently pulled out. Recent MRI demonstrates residual fluid collections. Plan for placement of another drain in the anterior thigh collection. EXAM: PLACEMENT OF LEFT THIGH DRAIN USING ULTRASOUND GUIDANCE MEDICATIONS: Sedation medications ANESTHESIA/SEDATION: Fentanyl 50 mcg IV; Versed 1.0 mg IV Moderate Sedation Time:  31 minutes The patient was continuously monitored during the procedure by the interventional radiology nurse under my direct supervision. COMPLICATIONS: None immediate. PROCEDURE:  Informed written consent was obtained from the patient after a thorough discussion of the procedural risks, benefits and alternatives. All questions were addressed. Maximal Sterile Barrier Technique was utilized including caps, mask, sterile gowns, sterile gloves, sterile drape, hand hygiene and skin antiseptic. A timeout was performed prior to the initiation of the procedure. The left thigh was evaluated with ultrasound. Fluid collection in the anterior left thigh musculature was identified and this represents the site of the previous drain. Recent MRI raised concern for a new fluid collection along the medial left thigh. No discrete fluid collection identified in the medial left thigh. The anterior left thigh was prepped with chlorhexidine and sterile field was created. Skin was anesthetized with 1% lidocaine. 18 gauge trocar needle directed into the anterior left thigh fluid collection with ultrasound guidance and a small amount of cloudy fluid was aspirated. Stiff Amplatz wire was advanced into the collection. The tract was dilated to accommodate a 10 [place] multipurpose drain. Small amount of additional cloudy fluid was removed. The  collection was decompressed. Drain was sutured to skin and attached to a suction bulb. FINDINGS: Small amount of fluid in the anterior left thigh soft tissues. Approximately 7 mL of cloudy fluid was removed from this collection. The collection was decompressed with a drain at the end of the procedure. The medial left thigh was evaluated with ultrasound to look for a discrete fluid collection. There is subcutaneous edema in this area but no discrete fluid collection. IMPRESSION: Successful ultrasound-guided placement of a drain within the small anterior left thigh fluid collection. Electronically Signed   By: Markus Daft M.D.   On: 02/14/2018 17:19   US Guided Needle Placement  Result Date: 01/29/2018 INDICATION: 84 year old male with multi compartment deep intramuscular abscesses within the left hip and thigh. EXAM: 1. Ultrasound-guided drain placement 2. Ultrasound-guided aspiration MEDICATIONS: The patient is currently admitted to the hospital and receiving intravenous antibiotics. The antibiotics were administered within an appropriate time frame prior to the initiation of the procedure. ANESTHESIA/SEDATION: Fentanyl 1 mcg IV; Versed 50 mg IV Moderate Sedation Time:  33 minutes The patient was continuously monitored during the procedure by the interventional radiology nurse under my direct supervision. COMPLICATIONS: None immediate. PROCEDURE: Informed written consent was obtained from the patient after a thorough discussion of the procedural risks, benefits and alternatives. All questions were addressed. A timeout was performed prior to the initiation of the procedure. Ultrasound was used to interrogate the soft tissues of the left lower extremity. There is an elongated highly complex fluid collection within the musculature of the anterior compartment. Additionally, more heterogeneous and ill-defined complex fluid collections are present medial in the groin partially surrounding the femoral vasculature in  the region of the adductor musculature. Multiple potential skin entry sites were selected and marked. The entire right thigh and groin were then sterilely prepped and draped in the standard fashion using chlorhexidine skin prep. Local anesthesia was attained by infiltration with 1% lidocaine. First, an attempt was made to place a drainage catheter into the large anterior compartment fluid collection. A small dermatotomy was made. Under real-time ultrasound guidance, an 18 gauge trocar needle was successfully advanced into the fluid collection. A 0.035 wire was then coiled in the fluid collection. The skin tract was dilated to 10 [place] and a [place] all-purpose drainage catheter was advanced over the wire and formed. Aspiration yields approximately 50 mL of thick, purulent fluid. The ultrasound probe was used to milk the length of the anterior compartment  abscess in till all aspirated will material was successfully collected. The catheter was then flushed and connected to JP bulb suction. Attention was next turned to the left groin in the region of the adductor musculature. Again, local anesthesia was attained by infiltration with 1% lidocaine and a small dermatotomy was made. This time, the 18 gauge needle was advanced into the more complex fluid collection under real-time sonographic guidance. The collection is too small for percutaneous drain placement. Therefore, aspiration was performed. Approximately 10 mL of thick bloody purulent fluid was aspirated. Post aspiration ultrasound imaging was performed demonstrating significant reduction in the volume of the abscess collections. The sample of aspirated purulent fluid was sent for Gram stain and culture. IMPRESSION: 1. Successful placement of a 10 French drainage catheter into the left thigh anterior compartment abscess. 2. Technically successful aspiration of the visible portion of the adductor musculature abscesses. 3. Aspirated fluid sent for culture.  Electronically Signed   By: Jacqulynn Cadet M.D.   On: 01/29/2018 17:24   US Guided Needle Placement  Result Date: 01/29/2018 INDICATION: 84 year old male with multi compartment deep intramuscular abscesses within the left hip and thigh. EXAM: 1. Ultrasound-guided drain placement 2. Ultrasound-guided aspiration MEDICATIONS: The patient is currently admitted to the hospital and receiving intravenous antibiotics. The antibiotics were administered within an appropriate time frame prior to the initiation of the procedure. ANESTHESIA/SEDATION: Fentanyl 1 mcg IV; Versed 50 mg IV Moderate Sedation Time:  33 minutes The patient was continuously monitored during the procedure by the interventional radiology nurse under my direct supervision. COMPLICATIONS: None immediate. PROCEDURE: Informed written consent was obtained from the patient after a thorough discussion of the procedural risks, benefits and alternatives. All questions were addressed. A timeout was performed prior to the initiation of the procedure. Ultrasound was used to interrogate the soft tissues of the left lower extremity. There is an elongated highly complex fluid collection within the musculature of the anterior compartment. Additionally, more heterogeneous and ill-defined complex fluid collections are present medial in the groin partially surrounding the femoral vasculature in the region of the adductor musculature. Multiple potential skin entry sites were selected and marked. The entire right thigh and groin were then sterilely prepped and draped in the standard fashion using chlorhexidine skin prep. Local anesthesia was attained by infiltration with 1% lidocaine. First, an attempt was made to place a drainage catheter into the large anterior compartment fluid collection. A small dermatotomy was made. Under real-time ultrasound guidance, an 18 gauge trocar needle was successfully advanced into the fluid collection. A 0.035 wire was then coiled in  the fluid collection. The skin tract was dilated to 10 [place] and a [place] all-purpose drainage catheter was advanced over the wire and formed. Aspiration yields approximately 50 mL of thick, purulent fluid. The ultrasound probe was used to milk the length of the anterior compartment abscess in till all aspirated will material was successfully collected. The catheter was then flushed and connected to JP bulb suction. Attention was next turned to the left groin in the region of the adductor musculature. Again, local anesthesia was attained by infiltration with 1% lidocaine and a small dermatotomy was made. This time, the 18 gauge needle was advanced into the more complex fluid collection under real-time sonographic guidance. The collection is too small for percutaneous drain placement. Therefore, aspiration was performed. Approximately 10 mL of thick bloody purulent fluid was aspirated. Post aspiration ultrasound imaging was performed demonstrating significant reduction in the volume of the abscess collections.  The sample of aspirated purulent fluid was sent for Gram stain and culture. IMPRESSION: 1. Successful placement of a 10 French drainage catheter into the left thigh anterior compartment abscess. 2. Technically successful aspiration of the visible portion of the adductor musculature abscesses. 3. Aspirated fluid sent for culture. Electronically Signed   By: Jacqulynn Cadet M.D.   On: 01/29/2018 17:24   Ct Abdomen Pelvis W Contrast  Result Date: 01/27/2018 CLINICAL DATA:  Acute, generalized abdominal pain with fever and stool incontinence. Clinical concern for possible upper thigh abscess. EXAM: CT ABDOMEN AND PELVIS WITH CONTRAST TECHNIQUE: Multidetector CT imaging of the abdomen and pelvis was performed using the standard protocol following bolus administration of intravenous contrast. CONTRAST:  141m OMNIPAQUE IOHEXOL 300 MG/ML  SOLN COMPARISON:  01/25/2018 CT-guided drainage and 01/22/2018  abdomen and pelvis CT. FINDINGS: Lower chest: Small to moderate-sized left pleural effusion and small right pleural effusion. Bilateral lower lobe compressive atelectasis. Dense coronary artery calcifications. Borderline enlarged heart. Hepatobiliary: Small liver cyst. Unremarkable gallbladder. Pancreas: Diffusely atrophied. Spleen: Unremarkable. Adrenals/Urinary Tract: Normal appearing adrenal glands. Bilateral renal cysts. Foley catheter in the urinary bladder. Unremarkable ureters. Stomach/Bowel: Gas distended right, transverse and left colon containing stool and oral contrast. Nondistended mid and distal sigmoid colon. Mild diffuse rectal wall thickening. Vascular/Lymphatic: Atheromatous arterial calcifications without aneurysm. No enlarged lymph nodes. Reproductive: Multiple prostate radiation seed implants. Moderate to large-sized bilateral scrotal hydroceles. Other: Pigtail catheter within the previously demonstrated right rectus sheath abscess. There is some fluid and gas within the rectus sheath at and above that location, measuring 2.9 x 2.4 cm on image number 77 series 3 with mild peripheral wall enhancement. This measures approximately 15.4 cm in length. Again demonstrated are multiple fluid and gas collections in the proximal left thigh, not included in their entirety. Some of the included portions are slightly larger and some are slightly smaller and these have some peripheral rim enhancement. Again demonstrated is fluid and gas within and surrounding the symphysis pubis with mild focal bone destruction and adjacent bony fragmentation. Diffuse subcutaneous edema. Musculoskeletal: Severe right hip degenerative changes with subarticular cyst formation. Moderate left hip degenerative changes. Lumbar and lower thoracic spine degenerative changes and mild scoliosis. IMPRESSION: 1. 2.9 x 2.4 x 15.4 cm right rectus sheath abscess containing a pigtail drainage catheter. 2. Multiple fluid and gas collections in  the proximal left thigh, not included in their entirety. Some of the included portions are slightly larger and some are slightly smaller and these have some peripheral rim enhancement, compatible with multiple abscesses. 3. Persistent findings compatible with abscess formation within and surrounding the symphysis pubis with mild changes of probable osteomyelitis. 4. Mild diffuse rectal wall thickening, most likely due to proctitis. 5. Small to moderate-sized left pleural effusion and small right pleural effusion. 6. Bilateral lower lobe compressive atelectasis. 7. Dense coronary artery atheromatous calcifications. 8. Moderate to large-sized bilateral scrotal hydroceles. Electronically Signed   By: SClaudie ReveringM.D.   On: 01/27/2018 00:10   Ct Abdomen Pelvis W Contrast  Result Date: 01/22/2018 CLINICAL DATA:  84year old male presents with fever and tachycardia with anaerobic bacteremia. Abdominal pain and fever with abscess suspected. EXAM: CT ABDOMEN AND PELVIS WITH CONTRAST TECHNIQUE: Multidetector CT imaging of the abdomen and pelvis was performed using the standard protocol following bolus administration of intravenous contrast. CONTRAST:  1053mISOVUE-300 IOPAMIDOL (ISOVUE-300) INJECTION 61% COMPARISON:  None. FINDINGS: Lower chest: Heart size is top normal without pericardial effusion. Trace bilateral pleural effusions with adjacent atelectasis are  identified at each lung base, left slightly greater than right. Hepatobiliary: 8 mm hypodensity in the subcapsular right hepatic lobe, too small to characterize but may reflect a small cyst or hemangioma. No enhancing mass or biliary dilatation. No abscess. The gallbladder is unremarkable and free of stones. Pancreas: Atrophic pancreas. No mass or ductal dilatation. Inflammation. Spleen: Normal size spleen without focal mass. Adrenals/Urinary Tract: Normal bilateral adrenal glands. Symmetric cortical enhancement both kidneys with bilateral renal cysts. No  nephrolithiasis nor hydroureteronephrosis. The urinary bladder is decompressed by Foley catheter. Stomach/Bowel: Small hiatal hernia is noted. The stomach is nondistended. Small bowel rotation is within limits. Enteric contrast is seen within jejunal loops without mechanical bowel obstruction or inflammation. Moderate stool retention is seen within the cecum and ascending colon with moderate stool also noted in the descending colon. No mural thickening is identified. The rectosigmoid is decompressed in appearance and likely explains the slightly thickened appearance. The appendix is not well visualized. Vascular/Lymphatic: Mild scattered aortoiliac atherosclerosis without aneurysm. Mild reactive adenopathy suspected of the window nodes without significant pathologic enlargement. The largest contains fat and measure up to 12 mm on right and 10 mm on the left. Reproductive: Brachy therapy seeds are imbedded within the prostate. Other: Soft tissue abscesses are identified about the pelvis and left hip. Mottled gas like lucency with low attenuating fluid is noted along the course of the distal right rectus muscle measuring 7.4 x 2.5 x 3.2 cm, series 6/49 and series 2/76. Additional tracking low-density fluid with mottled gas like lucencies are noted along the course of the left iliopsoas muscle extending to the left hip. Intramuscular abscesses are noted of the pectineus and obturator externus muscles bilaterally, left more prominent than right as well as left quadratus femoris muscle. Soft tissue abscess contiguous in adjacent to pubic symphysis is identified, series 2/81 measuring 3.3 x 4.4 x 4.1 cm. Soft tissue anasarca is noted of the included abdomen pelvis. Small low-density subcutaneous fluid collections are noted along the lateral aspect of both hips without gas like lucencies, right measuring at least 3.7 x 3 cm and on the left 5.5 by 1.9 cm, series 2/86. Musculoskeletal: Subtle cortical bone loss and  osteopenia noted along the inferior pubic bone bilaterally. Changes of osteomyelitis is of concern, series 2/87 on bone windows possibly from septic arthritis given presence of adjacent abscess. IMPRESSION: 1. The over arching finding is that of numerous intramuscular abscesses as stated above along the course of the distal right rectus, about the bilateral pectineus, obturator externus and left quadratus femoris muscles as well as left ileo psoas. 2. Small abscess arising off the pubic symphysis is also noted with subtle bone loss and cortical bone destruction raising concern for septic arthritis and osteomyelitis of the pubic bone. 3. Left greater than right small pleural effusions with atelectasis. 4. Bilateral renal cysts. These results were called by telephone at the time of interpretation on 01/22/2018 at 11:04 pm to NP Chaney Malling, who verbally acknowledged these results. Electronically Signed   By: Ashley Royalty M.D.   On: 01/22/2018 23:04   Mr Femur Left W Wo Contrast  Result Date: 02/11/2018 CLINICAL DATA:  Left thigh abscess/soft tissue mass EXAM: MR OF THE LEFT LOWER EXTREMITY WITHOUT AND WITH CONTRAST TECHNIQUE: Multiplanar, multisequence MR imaging of the left thigh was performed both before and after administration of intravenous contrast. CONTRAST:  7.5 cc Gadavist COMPARISON:  01/28/2018 FINDINGS: Bones/Joint/Cartilage In the contralateral, right thigh, we demonstrate severe subcortical cyst formation along the right  femoral head with loss of volume, and in the right acetabulum. There is some low-level and likely degenerative edema along the right hip. Today's exam was not optimized to assess the right lower extremity. There is abnormal osseous edema and enhancement in the bilateral pubic bones and in the adjacent pubic rami suspicious for osteomyelitis and septic pubic symphysis. There is a small knee effusion with only thin rim synovial enhancement, less than would be expected in the  setting of a septic joint. There is artifact extending through the patella and distal femur on some images such as on series 24. Considerable degenerative arthropathy of both knees. Ligaments N/A Muscles and Tendons The anterior compartmental abscess between the vastus intermedius and rectus femoris measures 1.6 by 3.8 by 15.6 cm (volume = 50 cm^3), this previously had a volume of 110 cubic cm. Abscess tracking along the right hip adductor musculature has a complex morphology that is difficult to measure, but appears moderately reduced in volume compared to 01/28/2018. Lateral trochanteric bursal collection with mild enhancement is likewise moderately reduced but not resolved. Along the medial margin of the right gluteus maximus muscle there is a 2.6 by 0.8 by 5.0 cm (volume = 5 cm^3) abscess which may have been present previously but is better seen today due to reduced boundary affect. Phlegmon tracking along the margins of the posterior compartment of the thigh appears mildly improved. There continues to be diffuse subcutaneous edema in the calf suggesting cellulitis, mildly improved from prior. A small abscess in the vastus medialis on image 38/22 measures 2.4 by 1.9 by 1.2 cm (volume = 2.9 cm^3) and was not readily apparent previously. Abnormal appearance of the right rectus abdominus, there is paid previously a abscess with a drain in this vicinity, this may still be in place. Soft tissues Subcutaneous edema in the left thigh is probably from cellulitis. Mildly improved from prior. IMPRESSION: 1. Various abscesses in the left thigh are mildly improved in volume compared to the prior exam. This includes the abscesses of the anterior compartment and adductor musculature. For example, a dominant anterior compartmental abscess primarily between the vastus intermedius and rectus femoris is currently 50 cubic cm, previously 110 cubic cm. 2. There may be a new small (2.9 cubic cm) abscess in the vastus medialis muscle  on the right. There is abnormal osseous edema in the pubic bones along with edema in the pubic symphysis and pubic rami, suspicious for osteomyelitis and septic pubic symphysis. 3. Fluid in the right rectus abdominus muscle likely representing drained abscess, correlate with patient history. 4. Edema and enhancement in the subcutaneous tissues of the left thigh, mildly improved from prior, probably from cellulitis. 5. Severe degenerative arthropathy of the right hip with confluent subcortical cyst formation, subcortical marrow edema, and volume loss in the right femoral head. Electronically Signed   By: Van Clines M.D.   On: 02/11/2018 17:20   Mr Femur Left W Wo Contrast  Result Date: 01/29/2018 CLINICAL DATA:  Infection in the left thigh EXAM: MR OF THE LEFT LOWER EXTREMITY WITHOUT AND WITH CONTRAST TECHNIQUE: Multiplanar, multisequence MR imaging of the left thigh was performed both before and after administration of intravenous contrast. CONTRAST:  7 cc Gadavist COMPARISON:  01/26/2018 pelvic CT FINDINGS: Bones/Joint/Cartilage T2 boundary effects, the proximal-most femur including the femoral head, and the distal most femur include knee, are obscured. No abnormal marrow edema or other findings of osteomyelitis involving the remainder of the femur. Small to moderate knee effusion with mild synovitis in  the suprapatellar bursa. Ligaments N/A Muscles and Tendons Severe myositis involving the right thigh musculature to include the hip adductor musculature, quadriceps musculature, and to a lesser extent the medial and posterior compartmental musculature. 2.0 by 3.7 cm abscess in the vicinity of the left distal operator externus/quadratus femoris muscle, image 12/11. 2.2 by 4.1 by 24.1 cm (volume = 110 cm^3) abscess in the anterior compartment interposed between the rectus femoris and vastus intermedius. Extensive edema throughout the anterior compartmental musculature, with associated accentuated muscular  enhancement. No obvious current muscle necrosis. Speckled gas is present within along the hip adductor musculature, distal iloipsoas abscess, and some of the anterior compartmental upper thigh abscess including the vastus intermedius and vastus medialis. Soft tissues Severe subcutaneous edema involving the perineum, scrotum, and left thigh extending down to the knee. Notable edema along the medial superficial side of the gracilis muscle. Contralateral muscular and subcutaneous edema is present, although not as severe is on the left side. Foley catheter is observed. IMPRESSION: 1. Severe myositis and likely fasciitis in the left thigh, with a long vertically oriented anterior compartmental abscess, considerable abscess formation with speckles of gas density along the hip adductor musculature, abnormal muscle edema most severely affecting the anterior compartment, and severe subcutaneous edema of the perineum, scrotum, and left thigh. A left hip effusion is present with mild synovitis, but we don't see definite signs of osteomyelitis with the understanding that the knee and hip are obscured by boundary artifact. 2. Right thigh muscular compartmental and subcutaneous edema is also present, although not as severe as on the left. Electronically Signed   By: Van Clines M.D.   On: 01/29/2018 07:48   Ct Image Guided Drainage By Percutaneous Catheter  Result Date: 01/25/2018 CLINICAL DATA:  Multiple abscesses including a lower right rectus sheath abscess. EXAM: CT GUIDED CATHETER DRAINAGE OF RIGHT RECTUS SHEATH ABSCESS ANESTHESIA/SEDATION: 2.0 mg IV Versed 100 mcg IV Fentanyl Total Moderate Sedation Time:  15 minutes The patient's level of consciousness and physiologic status were continuously monitored during the procedure by Radiology nursing. PROCEDURE: The procedure, risks, benefits, and alternatives were explained to the patient. Questions regarding the procedure were encouraged and answered. The patient  understands and consents to the procedure. A time out was performed prior to initiating the procedure. CT was performed through the lower abdomen and pelvis in a supine position. The abdominal wall was prepped with chlorhexidine in a sterile fashion, and a sterile drape was applied covering the operative field. A sterile gown and sterile gloves were used for the procedure. Local anesthesia was provided with 1% Lidocaine. Under CT guidance, an 18 gauge trocar needle was advanced into the right lower rectus sheath musculature. A guidewire was advanced and the needle removed. The tract was dilated and a 10 French percutaneous drain placed. Drain placement was confirmed by CT. The drainage catheter was flushed and connected to a suction bulb. A sample of fluid was sent for culture analysis. COMPLICATIONS: None FINDINGS: Aspiration at the level of the right rectus sheath abscess revealed purulent fluid. A fluid sample was sent for culture analysis. After placement of a 10 French drain, there is further return of purulent fluid. IMPRESSION: CT-guided drainage of right rectus sheath abscess yielding purulent fluid. A 10 French drainage catheter was placed and attached to suction bulb drainage. Electronically Signed   By: Aletta Edouard M.D.   On: 01/25/2018 13:26   [place] Ekg Site Rite  Result Date: 02/07/2018 If Site Rite image not attached, placement could  not be confirmed due to current cardiac rhythm.  US Abdomen Limited Ruq  Result Date: 02/01/2018 CLINICAL DATA:  Abnormal liver function tests. EXAM: ULTRASOUND ABDOMEN LIMITED RIGHT UPPER QUADRANT COMPARISON:  CT scan of January 26, 2018. FINDINGS: Gallbladder: No gallstones or wall thickening visualized. No sonographic Murphy sign noted by sonographer. Common bile duct: Diameter: 3 mm which is within normal limits. Liver: No focal lesion identified. Within normal limits in parenchymal echogenicity. Portal vein is patent on color Doppler imaging with normal  direction of blood flow towards the liver. IMPRESSION: No definite abnormality seen in the right upper quadrant of the abdomen. Electronically Signed   By: Marijo Conception, M.D.   On: 02/01/2018 10:50    Microbiology: Recent Results (from the past 240 hour(s))  Gastrointestinal Panel by PCR , Stool     Status: None   Collection Time: 02/08/18 10:36 PM  Result Value Ref Range Status   Campylobacter species NOT DETECTED NOT DETECTED Final   Plesimonas shigelloides NOT DETECTED NOT DETECTED Final   Salmonella species NOT DETECTED NOT DETECTED Final   Yersinia enterocolitica NOT DETECTED NOT DETECTED Final   Vibrio species NOT DETECTED NOT DETECTED Final   Vibrio cholerae NOT DETECTED NOT DETECTED Final   Enteroaggregative E coli (EAEC) NOT DETECTED NOT DETECTED Final   Enteropathogenic E coli (EPEC) NOT DETECTED NOT DETECTED Final   Enterotoxigenic E coli (ETEC) NOT DETECTED NOT DETECTED Final   Shiga like toxin producing E coli (STEC) NOT DETECTED NOT DETECTED Final   Shigella/Enteroinvasive E coli (EIEC) NOT DETECTED NOT DETECTED Final   Cryptosporidium NOT DETECTED NOT DETECTED Final   Cyclospora cayetanensis NOT DETECTED NOT DETECTED Final   Entamoeba histolytica NOT DETECTED NOT DETECTED Final   Giardia lamblia NOT DETECTED NOT DETECTED Final   Adenovirus F40/41 NOT DETECTED NOT DETECTED Final   Astrovirus NOT DETECTED NOT DETECTED Final   Norovirus GI/GII NOT DETECTED NOT DETECTED Final   Rotavirus A NOT DETECTED NOT DETECTED Final   Sapovirus (I, II, IV, and V) NOT DETECTED NOT DETECTED Final    Comment: Performed at [facility], [address] [place], [place] [address]  C difficile quick scan w PCR reflex     Status: None   Collection Time: 02/10/18  1:14 PM  Result Value Ref Range Status   C Diff antigen NEGATIVE NEGATIVE Final   C Diff toxin NEGATIVE NEGATIVE Final   C Diff interpretation No C. difficile detected.  Final    Comment: Performed at [facility], [place] [address], [place], [place] [address]     Labs: Basic Metabolic Panel: Recent Labs  Lab 02/12/18 0404 02/14/18 0318 02/15/18 0430 02/17/18 0327  NA 134* 132* 134*  --   K 3.5 3.1* 3.6  --   CL 97* 98 98  --   CO2 '27 25 26  '$ --   GLUCOSE 209* 223* 167*  --   BUN '10 8 10  '$ --   CREATININE 0.75 0.63 0.71 0.61  CALCIUM 7.6* 7.8* 8.0*  --    Liver Function Tests: No results for input(s): AST, ALT, ALKPHOS, BILITOT, PROT, ALBUMIN in the last 168 hours. No results for input(s): LIPASE, AMYLASE in the last 168 hours. No results for input(s): AMMONIA in the last 168 hours. CBC: Recent Labs  Lab 02/12/18 0404 02/14/18 0318 02/15/18 0430  WBC 9.0 11.1* 13.0*  NEUTROABS 6.5 8.6* 10.3*  HGB 7.8* 7.6* 7.6*  HCT 26.5* 25.5* 25.6*  MCV 81.5  81.0 81.3  PLT 767* 789* 831*   Cardiac Enzymes: No results for input(s): CKTOTAL, CKMB, CKMBINDEX, TROPONINI in the last 168 hours. BNP: BNP (last 3 results) No results for input(s): BNP in the last 8760 hours.  ProBNP (last 3 results) No results for input(s): PROBNP in the last 8760 hours.  CBG: Recent Labs  Lab 02/17/18 1758 02/17/18 2101 02/17/18 2230 02/18/18 0901 02/18/18 1248  GLUCAP 430* 68* 144* 199* 317*       Signed:  Oswald Hillock MD.  [facility] 02/18/2018, 1:26 PM

## 2018-02-20 ENCOUNTER — Other Ambulatory Visit: Payer: Self-pay | Admitting: Interventional Radiology

## 2018-02-20 ENCOUNTER — Other Ambulatory Visit: Payer: Self-pay | Admitting: Infectious Diseases

## 2018-02-20 DIAGNOSIS — L0291 Cutaneous abscess, unspecified: Secondary | ICD-10-CM

## 2018-02-20 DIAGNOSIS — L02416 Cutaneous abscess of left lower limb: Secondary | ICD-10-CM

## 2018-02-25 ENCOUNTER — Other Ambulatory Visit: Payer: Self-pay | Admitting: Infectious Diseases

## 2018-02-25 ENCOUNTER — Other Ambulatory Visit: Payer: Self-pay | Admitting: *Deleted

## 2018-02-25 DIAGNOSIS — L0291 Cutaneous abscess, unspecified: Secondary | ICD-10-CM

## 2018-02-25 DIAGNOSIS — L02416 Cutaneous abscess of left lower limb: Secondary | ICD-10-CM

## 2018-03-04 ENCOUNTER — Ambulatory Visit: Payer: Medicare Other | Admitting: "Endocrinology

## 2018-03-05 ENCOUNTER — Other Ambulatory Visit: Payer: Medicare Other

## 2018-03-06 ENCOUNTER — Ambulatory Visit
Admission: RE | Admit: 2018-03-06 | Discharge: 2018-03-06 | Disposition: A | Discharge status: Home / Self Care | Payer: Medicare Other | Source: Ambulatory Visit | Attending: Interventional Radiology | Admitting: Interventional Radiology

## 2018-03-06 ENCOUNTER — Encounter: Payer: Self-pay | Admitting: Radiology

## 2018-03-06 ENCOUNTER — Ambulatory Visit
Admission: RE | Admit: 2018-03-06 | Discharge: 2018-03-06 | Disposition: A | Discharge status: Home / Self Care | Payer: Medicare Other | Source: Ambulatory Visit | Attending: Physician Assistant | Admitting: Physician Assistant

## 2018-03-06 DIAGNOSIS — L0291 Cutaneous abscess, unspecified: Secondary | ICD-10-CM

## 2018-03-06 DIAGNOSIS — L02416 Cutaneous abscess of left lower limb: Secondary | ICD-10-CM

## 2018-03-06 HISTORY — PX: IR RADIOLOGIST EVAL & MGMT: IMG5224

## 2018-03-06 MED ORDER — IOPAMIDOL (ISOVUE-300) INJECTION 61%
100.0000 mL | Freq: Once | INTRAVENOUS | Status: AC | PRN
  Administered 2018-03-06: 100 mL via INTRAVENOUS

## 2018-03-06 NOTE — Progress Notes (Signed)
Referring Physician(s): Dr Selena Batten  Chief Complaint: The patient is seen in follow up today s/p  right rectus sheath abscess drain placed 12/7 by Dr. Kathlene Cote and left thigh abscess drain placed 12/11 by Dr. Laurence Ferrari and replaced 12/27 by Dr. Anselm Pancoast  History of present illness:  Follow up of drains today OP is minimal for both per pt and verified with paper work from SNF Denies pain Denies fever/chills Has no complaints Confirms SNF does flush ea drain 1-2x daily  Scheduled for CT today  Past Medical History:  Diagnosis Date  . Diabetes ([place])   . Left eye pain   . Prostate cancer [facility])     Past Surgical History:  Procedure Laterality Date  . HERNIA REPAIR    . PROSTATE SURGERY      Allergies: Patient has no known allergies.  Medications: Prior to Admission medications   Medication Sig Start Date End Date Taking? Authorizing Provider  acetaminophen (TYLENOL) 325 MG tablet Take 2 tablets (650 mg total) by mouth every 6 (six) hours as needed for mild pain, fever or headache. 02/18/18   Oswald Hillock, MD  ampicillin-sulbactam (UNASYN) IVPB Inject 3 g into the vein every 6 (six) hours. Indication:  abscesses and pelvic osteo  Last Day of Therapy:  03/25/2018 Labs - Once weekly:  CBC/D and BMP, Labs - Every other week:  ESR and CRP 02/18/18   Oswald Hillock, MD  aspirin EC 81 MG tablet Take 81 mg by mouth daily.    [provider]  ferrous sulfate 325 (65 FE) MG tablet Take 1 tablet (325 mg total) by mouth daily with breakfast. 02/19/18   Oswald Hillock, MD  insulin aspart (NOVOLOG) 100 UNIT/ML injection Inject 0-15 Units into the skin 3 (three) times daily with meals. Sliding scale insulin Less than 70 initiate hypoglycemia protocol 70-120  0 units 120-150 2 unit 151-200 3 units 201-250 3 units 251-300 5 units 301-350 8 units 351-400 11 units  Greater than 400 15 units , call MD 02/18/18   Oswald Hillock, MD  LEVEMIR FLEXTOUCH 100 UNIT/ML Pen Inject 20 Units into  the skin daily. 02/18/18   Oswald Hillock, MD  levothyroxine (SYNTHROID, LEVOTHROID) 50 MCG tablet Take 1 tablet (50 mcg total) by mouth daily at 6 (six) AM. 02/19/18   Oswald Hillock, MD  magnesium oxide (MAG-OX) 400 (241.3 Mg) MG tablet Take 1 tablet (400 mg total) by mouth daily. 02/19/18   Oswald Hillock, MD  Multiple Vitamin (MULTIVITAMIN WITH MINERALS) TABS tablet Take 1 tablet by mouth daily. 02/19/18   Oswald Hillock, MD  nutrition supplement, JUVEN, (JUVEN) PACK Take 1 packet by mouth 2 (two) times daily between meals. 02/19/18   Oswald Hillock, MD  pravastatin (PRAVACHOL) 10 MG tablet Take 10 mg by mouth daily.    [provider]     Family History  Problem Relation Age of Onset  . Pneumonia Father   . Cancer Sister     Social History   Socioeconomic History  . Marital status: Married    Spouse name: [name]  . Number of children: 3  . Years of education: 9th  . Highest education level: Not on file  Occupational History    Employer: RETIRED    Comment: Retired  Scientific laboratory technician  . Financial resource strain: Not on file  . Food insecurity:    Worry: Not on file    Inability: Not on file  . Transportation needs:  Medical: Not on file    Non-medical: Not on file  Tobacco Use  . Smoking status: Never Smoker  . Smokeless tobacco: Never Used  Substance and Sexual Activity  . Alcohol use: No  . Drug use: No  . Sexual activity: Not on file  Lifestyle  . Physical activity:    Days per week: Not on file    Minutes per session: Not on file  . Stress: Not on file  Relationships  . Social connections:    Talks on phone: Not on file    Gets together: Not on file    Attends religious service: Not on file    Active member of club or organization: Not on file    Attends meetings of clubs or organizations: Not on file    Relationship status: Not on file  Other Topics Concern  . Not on file  Social History Narrative   Patient lives at home with his wife. [name]) . Patient  is retired.   Education 9th grade.   Right handed.   Caffeine None     Vital Signs: There were no vitals taken for this visit.  Physical Exam Skin:    General: Skin is warm and dry.     Comments: Sites are clean and dry NT no bleeding OP in ea JP is scant Color is serous  Neurological:     Mental Status: He is alert and oriented to person, place, and time.   CT has been read today as abscesses resolved No residual collections per Dr Barbie Banner  Imaging: Ct Abdomen Pelvis W Contrast  Result Date: 03/06/2018 CLINICAL DATA:  Follow-up drains EXAM: CT ABDOMEN AND PELVIS WITH CONTRAST TECHNIQUE: Multidetector CT imaging of the abdomen and pelvis was performed using the standard protocol following bolus administration of intravenous contrast. CONTRAST:  133m ISOVUE-300 IOPAMIDOL (ISOVUE-300) INJECTION 61% COMPARISON:  01/26/2018 FINDINGS: Lower chest: Dependent atelectasis bilaterally. Pleural effusions resolved. Hepatobiliary: Small cyst in the anterior segment of the right lobe is stable. Gallbladder is unremarkable. Pancreas: Atrophic. Spleen: Unremarkable Adrenals/Urinary Tract: Adrenal glands are within normal limits. Chronic changes of the kidneys bilaterally. There are simple cysts in both kidneys. No hydronephrosis. Diffuse bladder wall thickening and Foley catheter are stable. Stomach/Bowel: Prominent stool burden throughout the colon. Normal appendix. Mildly distended small bowel loops are likely related to stool burden in the colon. Stomach is decompressed. Vascular/Lymphatic: No evidence of aortic aneurysm. No abnormal retroperitoneal adenopathy. Atherosclerotic vascular calcifications are noted. Reproductive: Brachy therapy pellets in the prostate. Other: There is a drain in the right lower rectus sheath. The previously visualized abscess has completely resolved. There is also a drain within the soft tissues of the left thigh. The previously visualized multiloculated abscess in the  anterior proximal thigh has resolved. Musculoskeletal: There is bony destruction at the symphysis pubis again suggesting osteomyelitis. Severe degenerative change in both hip joints. IMPRESSION: Rectus abdominus abscess has resolved with the drain in place. Left thigh soft tissue abscess has also resolved with the drain in place. Bilateral small pleural effusions resolved. Dependent atelectasis persists. Otherwise stable exam. Electronically Signed   By: AMarybelle KillingsM.D.   On: 03/06/2018 14:40    Labs:  CBC: Recent Labs    02/11/18 0356 02/12/18 0404 02/14/18 0318 02/15/18 0430  WBC 8.5 9.0 11.1* 13.0*  HGB 7.6* 7.8* 7.6* 7.6*  HCT 25.0* 26.5* 25.5* 25.6*  PLT 712* 767* 789* 831*    COAGS: Recent Labs    01/03/18 1929 01/17/18 0443 01/25/18  5391 02/14/18 0318  INR 1.20 1.31 1.15 1.23  APTT  --  37*  --   --     BMP: Recent Labs    02/11/18 0356 02/12/18 0404 02/14/18 0318 02/15/18 0430 02/17/18 0327  NA 132* 134* 132* 134*  --   K 3.0* 3.5 3.1* 3.6  --   CL 95* 97* 98 98  --   CO2 '26 27 25 26  '$ --   GLUCOSE 149* 209* 223* 167*  --   BUN '10 10 8 10  '$ --   CALCIUM 7.5* 7.6* 7.8* 8.0*  --   CREATININE 0.69 0.75 0.63 0.71 0.61  GFRNONAA >60 >60 >60 >60 >60  GFRAA >60 >60 >60 >60 >60    LIVER FUNCTION TESTS: Recent Labs    01/05/18 0551 01/17/18 0237 01/30/18 0225 01/31/18 1041  BILITOT 0.3 0.7 0.3 0.8  AST 21 19 38 56*  ALT '19 15 31 31  '$ ALKPHOS 141* 152* 249* 330*  PROT 7.1 7.2 5.7* 6.4*  ALBUMIN 2.6* 2.5* 1.4* 1.6*    Assessment:  Rectus sheath and left thigh abscesses are resolved Both drains removed per Dr Barbie Banner order Pt tolerated well  Signed: Lavonia Drafts, PA-C 03/06/2018, 3:00 PM   Please refer to Dr. Barbie Banner attestation of this note for management and plan.

## 2018-03-07 ENCOUNTER — Inpatient Hospital Stay: Payer: Medicare Other | Admitting: Infectious Disease

## 2018-03-18 ENCOUNTER — Telehealth: Payer: Self-pay

## 2018-03-18 ENCOUNTER — Ambulatory Visit (INDEPENDENT_AMBULATORY_CARE_PROVIDER_SITE_OTHER): Payer: Medicare Other | Admitting: Infectious Disease

## 2018-03-18 ENCOUNTER — Encounter: Payer: Self-pay | Admitting: Infectious Disease

## 2018-03-18 VITALS — BP 145/83 | HR 117 | Temp 97.5°F

## 2018-03-18 DIAGNOSIS — B952 Enterococcus as the cause of diseases classified elsewhere: Secondary | ICD-10-CM

## 2018-03-18 DIAGNOSIS — E1165 Type 2 diabetes mellitus with hyperglycemia: Secondary | ICD-10-CM | POA: Diagnosis not present

## 2018-03-18 DIAGNOSIS — A414 Sepsis due to anaerobes: Secondary | ICD-10-CM | POA: Diagnosis not present

## 2018-03-18 DIAGNOSIS — Z794 Long term (current) use of insulin: Secondary | ICD-10-CM

## 2018-03-18 DIAGNOSIS — C61 Malignant neoplasm of prostate: Secondary | ICD-10-CM

## 2018-03-18 DIAGNOSIS — L02416 Cutaneous abscess of left lower limb: Secondary | ICD-10-CM

## 2018-03-18 DIAGNOSIS — K6812 Psoas muscle abscess: Secondary | ICD-10-CM | POA: Diagnosis not present

## 2018-03-18 NOTE — Telephone Encounter (Signed)
Per [name] called patient's living facility to have lab reports faxed to our office. Spoke with Mickle Mallory, Rn who will fax labs to [phone]. [facility]:  [place], [place]

## 2018-03-18 NOTE — Progress Notes (Signed)
Chief complaint follow-up for thigh and rectus abscesses as well as pelvic osteomyelitis  Subjective:    Patient ID: Anthony Dunlap, male    DOB: 04/11/1933, 85 y.o.   MRN: [id number]  HPI  Anthony Dunlap is a 85 y.o. 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  He is currently residing in a skilled nursing facility and on Zosyn rather than Unasyn.  I do not know why this change was made as his microbes were covered by the Unasyn.  In any case the abscesses have resolved radiographically and the drains been pulled by interventional radiology.  He says he does not have hip pain he is able to walk with a walker though his ability to walk is somewhat limited.  He is accompanied by his daughter who is asking if the abscesses could have affected the muscles.  Told her this is possible by more concerned about the area of osteomyelitis in his pubic symphysis.  Hoping that antimicrobial therapy was sufficient to get this under control.  He did not have any kind of surgical intervention currently he is not with any significant pain and seems to be in relatively well.  Past Medical History:  Diagnosis Date  . Diabetes ([place])   . Left eye pain   . Prostate cancer [facility])     Past Surgical History:  Procedure Laterality Date  . HERNIA REPAIR    . IR RADIOLOGIST EVAL & MGMT  03/06/2018  . PROSTATE SURGERY      Family History  Problem Relation Age of Onset  . Pneumonia Father   . Cancer Sister       Social History   Socioeconomic History  . Marital status: Married    Spouse name: Anthony Dunlap  . Number of children: 3  . Years of education: 9th  . Highest education level: Not on file  Occupational History    Employer: RETIRED    Comment: Retired  Scientific laboratory technician  . Financial resource strain:  Not on file  . Food insecurity:    Worry: Not on file    Inability: Not on file  . Transportation needs:    Medical: Not on file    Non-medical: Not on file  Tobacco Use  . Smoking status: Never Smoker  . Smokeless tobacco: Never Used  Substance and Sexual Activity  . Alcohol use: No  . Drug use: No  . Sexual activity: Not on file  Lifestyle  . Physical activity:    Days per week: Not on file    Minutes per session: Not on file  . Stress: Not on file  Relationships  . Social connections:    Talks on phone: Not on file    Gets together: Not on file    Attends religious service: Not on file    Active member of club or organization: Not on file    Attends meetings of clubs or organizations: Not on file    Relationship status: Not on file  Other Topics Concern  . Not on file  Social History Narrative   Patient lives at home with his wife. Anthony Dunlap) . Patient is retired.   Education 9th grade.   Right handed.   Caffeine None    No Known Allergies   Current Outpatient  Medications:  .  acetaminophen (TYLENOL) 325 MG tablet, Take 2 tablets (650 mg total) by mouth every 6 (six) hours as needed for mild pain, fever or headache., Disp: , Rfl:  .  ferrous sulfate 325 (65 FE) MG tablet, Take 1 tablet (325 mg total) by mouth daily with breakfast., Disp: , Rfl: 3 .  insulin aspart (NOVOLOG) 100 UNIT/ML injection, Inject 0-15 Units into the skin 3 (three) times daily with meals. Sliding scale insulin Less than 70 initiate hypoglycemia protocol 70-120  0 units 120-150 2 unit 151-200 3 units 201-250 3 units 251-300 5 units 301-350 8 units 351-400 11 units  Greater than 400 15 units , call MD, Disp: 10 mL, Rfl: 11 .  LEVEMIR FLEXTOUCH 100 UNIT/ML Pen, Inject 20 Units into the skin daily., Disp: 15 mL, Rfl: 3 .  levothyroxine (SYNTHROID, LEVOTHROID) 50 MCG tablet, Take 1 tablet (50 mcg total) by mouth daily at 6 (six) AM., Disp: , Rfl:  .  pravastatin (PRAVACHOL) 10 MG tablet, Take 10 mg by  mouth daily., Disp: , Rfl:  .  ampicillin-sulbactam (UNASYN) IVPB, Inject 3 g into the vein every 6 (six) hours. Indication:  abscesses and pelvic osteo  Last Day of Therapy:  03/25/2018 Labs - Once weekly:  CBC/D and BMP, Labs - Every other week:  ESR and CRP, Disp: 120 Units, Rfl: 0 .  aspirin EC 81 MG tablet, Take 81 mg by mouth daily., Disp: , Rfl:  .  magnesium oxide (MAG-OX) 400 (241.3 Mg) MG tablet, Take 1 tablet (400 mg total) by mouth daily., Disp: , Rfl:  .  Multiple Vitamin (MULTIVITAMIN WITH MINERALS) TABS tablet, Take 1 tablet by mouth daily., Disp: , Rfl:  .  nutrition supplement, JUVEN, (JUVEN) PACK, Take 1 packet by mouth 2 (two) times daily between meals., Disp: , Rfl: 0  Review of Systems  Constitutional: Negative for chills and fever.  HENT: Negative for congestion and sore throat.   Eyes: Negative for photophobia.  Respiratory: Negative for cough, shortness of breath and wheezing.   Cardiovascular: Negative for chest pain, palpitations and leg swelling.  Gastrointestinal: Negative for abdominal pain, blood in stool, constipation, diarrhea, nausea and vomiting.  Genitourinary: Negative for dysuria, flank pain and hematuria.  Musculoskeletal: Negative for back pain and myalgias.  Skin: Negative for rash.  Neurological: Positive for weakness. Negative for dizziness and headaches.  Hematological: Does not bruise/bleed easily.  Psychiatric/Behavioral: Negative for suicidal ideas.       Objective:   Physical Exam Constitutional:      General: He is not in acute distress.    Appearance: Normal appearance. He is well-developed. He is not ill-appearing or diaphoretic.  HENT:     Head: Normocephalic and atraumatic.     Right Ear: Hearing and external ear normal.     Left Ear: Hearing and external ear normal.     Nose: No nasal deformity or rhinorrhea.  Eyes:     General: No scleral icterus.    Conjunctiva/sclera: Conjunctivae normal.     Right eye: Right conjunctiva is not  injected.     Left eye: Left conjunctiva is not injected.     Pupils: Pupils are equal, round, and reactive to light.  Neck:     Musculoskeletal: Normal range of motion and neck supple.     Vascular: No JVD.  Cardiovascular:     Rate and Rhythm: Normal rate and regular rhythm.     Heart sounds: Normal heart sounds, S1 normal and S2  normal. No murmur. No friction rub.  Abdominal:     General: Bowel sounds are normal. There is no distension.     Palpations: Abdomen is soft.     Tenderness: There is no abdominal tenderness.  Musculoskeletal:     Right shoulder: Normal.     Left shoulder: Normal.     Right hip: He exhibits decreased range of motion. He exhibits normal strength, no tenderness, no bony tenderness and no laceration.     Left hip: He exhibits decreased range of motion. He exhibits no tenderness, no bony tenderness, no swelling, no crepitus, no deformity and no laceration.     Right knee: Normal.     Left knee: Normal.       Legs:  Lymphadenopathy:     Head:     Right side of head: No submandibular, preauricular or posterior auricular adenopathy.     Left side of head: No submandibular, preauricular or posterior auricular adenopathy.     Cervical: No cervical adenopathy.     Right cervical: No superficial or deep cervical adenopathy.    Left cervical: No superficial or deep cervical adenopathy.  Skin:    General: Skin is warm and dry.     Coloration: Skin is not pale.     Findings: No abrasion, bruising, ecchymosis, erythema, lesion or rash.     Nails: There is no clubbing.   Neurological:     Mental Status: He is alert and oriented to person, place, and time.     Sensory: No sensory deficit.     Coordination: Coordination normal.     Gait: Gait normal.  Psychiatric:        Attention and Perception: He is attentive.        Mood and Affect: Mood normal.        Speech: Speech normal.        Behavior: Behavior normal. Behavior is cooperative.        Judgment:  Judgment normal.           Assessment & Plan:  KOJI NIEHOFF is a 85 y.o. 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  We will check sed rate C-reactive protein and safety labs today.  He can complete his antimicrobial therapy on 4 February.  If everything is still looking to be improving I would like to observe him off antimicrobials and see him back towards the end of February.  I spent greater than 25 minutes with the patient including greater than 50% of time in face to face counsel of the patient and his daughter during the signs and symptoms of osteomyelitis and how he is to pay close attention to his pain and ability to move over the ensuing weeks and in particular after he comes off antibiotics and in coordination of his care.

## 2018-03-18 NOTE — Patient Instructions (Signed)
Go ahead and get labs today on you.  We will have you complete your antibiotics in 4 February.  After that they can be stopped and your PICC line can be discontinued.  I would like to see you back at the end of February to see how you are doing pay attention to any pain in the hip or problems walking

## 2018-03-19 LAB — CBC WITH DIFFERENTIAL/PLATELET
Absolute Monocytes: 655 cells/uL (ref 200–950)
Basophils Absolute: 90 cells/uL (ref 0–200)
Basophils Relative: 0.8 %
EOS ABS: 768 {cells}/uL — AB (ref 15–500)
Eosinophils Relative: 6.8 %
HCT: 31 % — ABNORMAL LOW (ref 38.5–50.0)
HEMOGLOBIN: 9.7 g/dL — AB (ref 13.2–17.1)
Lymphs Abs: 1865 cells/uL (ref 850–3900)
MCH: 25.6 pg — ABNORMAL LOW (ref 27.0–33.0)
MCHC: 31.3 g/dL — ABNORMAL LOW (ref 32.0–36.0)
MCV: 81.8 fL (ref 80.0–100.0)
MPV: 10.4 fL (ref 7.5–12.5)
Monocytes Relative: 5.8 %
Neutro Abs: 7921 cells/uL — ABNORMAL HIGH (ref 1500–7800)
Neutrophils Relative %: 70.1 %
PLATELETS: 582 10*3/uL — AB (ref 140–400)
RBC: 3.79 10*6/uL — ABNORMAL LOW (ref 4.20–5.80)
RDW: 16.6 % — ABNORMAL HIGH (ref 11.0–15.0)
Total Lymphocyte: 16.5 %
WBC: 11.3 10*3/uL — ABNORMAL HIGH (ref 3.8–10.8)

## 2018-03-19 LAB — BASIC METABOLIC PANEL WITH GFR
BUN: 19 mg/dL (ref 7–25)
CO2: 24 mmol/L (ref 20–32)
Calcium: 8.8 mg/dL (ref 8.6–10.3)
Chloride: 96 mmol/L — ABNORMAL LOW (ref 98–110)
Creat: 0.82 mg/dL (ref 0.70–1.11)
GFR, Est African American: 95 mL/min/{1.73_m2} (ref >=60)
GFR, Est Non African American: 82 mL/min/{1.73_m2} (ref >=60)
GLUCOSE: 469 mg/dL — AB (ref 65–99)
Potassium: 4.8 mmol/L (ref 3.5–5.3)
Sodium: 132 mmol/L — ABNORMAL LOW (ref 135–146)

## 2018-03-19 LAB — SEDIMENTATION RATE: Sed Rate: 92 mm/h — ABNORMAL HIGH (ref 0–20)

## 2018-03-19 LAB — C-REACTIVE PROTEIN: CRP: 76 mg/L — ABNORMAL HIGH (ref <8.0)

## 2018-03-20 ENCOUNTER — Other Ambulatory Visit: Payer: Self-pay | Admitting: Infectious Disease

## 2018-03-20 DIAGNOSIS — M869 Osteomyelitis, unspecified: Secondary | ICD-10-CM

## 2018-03-21 ENCOUNTER — Telehealth: Payer: Self-pay | Admitting: Behavioral Health

## 2018-03-21 ENCOUNTER — Telehealth: Payer: Self-pay

## 2018-03-21 NOTE — Telephone Encounter (Signed)
-----   Message from Truman Hayward, MD sent at 03/20/2018  5:37 PM EST ----- Since inflammatory markers are up dramatically.  I am concerned that he has a deep infection in the pelvis.  I would like to get imaging of these sites as soon as possible and put orders in the computer

## 2018-03-21 NOTE — Telephone Encounter (Signed)
Called patient regarding some lab results per [name]. Spoke with patient's daughter (okay per patient) informed them that Anthony Dunlap's inflammation markers are elevated, and that [name] is concerned it could be a deep infection in pelvis. [name] would like for patient to have MRI of pelvis done; informed Joycelyn Schmid, referral coordinator who will assist in getting imaging scheduled. Patient is aware that he will need to go in for MRI, and that imaging center will call with an appointment. Patient would like to know if he can have MRI done in [place]; if not he is okay with coming to [facility] will inform  [facility].  [place]

## 2018-03-21 NOTE — Telephone Encounter (Signed)
Thanks Ashley

## 2018-03-21 NOTE — Telephone Encounter (Signed)
[facility] called to give urgent lab results from 03/18/2018.  Patient's glucoser during that lab draw was 469.    Called patient's daughter to inform her but she states her father resides at hte [facility] in [place].  Called the [facility] in [place] spoke to patient's nurse Pamala Hurry.  Informed her on 03/18/2018 patient's Glucose was 469.  She states patient's blood sugar had been running high so they increased his insulin 70/30.  She states patient's blood sugar was 152 this morning before breakfast [place]

## 2018-03-26 ENCOUNTER — Other Ambulatory Visit (HOSPITAL_COMMUNITY): Payer: Medicare Other

## 2018-03-28 ENCOUNTER — Ambulatory Visit (HOSPITAL_COMMUNITY)
Admission: RE | Admit: 2018-03-28 | Discharge: 2018-03-28 | Disposition: A | Discharge status: Home / Self Care | Payer: Medicare Other | Source: Ambulatory Visit | Attending: Infectious Disease | Admitting: Infectious Disease

## 2018-03-28 ENCOUNTER — Ambulatory Visit (HOSPITAL_COMMUNITY): Payer: Medicare Other

## 2018-03-28 ENCOUNTER — Ambulatory Visit (HOSPITAL_COMMUNITY): Admission: RE | Admit: 2018-03-28 | Payer: Medicare Other | Source: Ambulatory Visit

## 2018-03-28 DIAGNOSIS — M869 Osteomyelitis, unspecified: Secondary | ICD-10-CM

## 2018-03-28 MED ORDER — GADOBUTROL 1 MMOL/ML IV SOLN
7.5000 mL | Freq: Once | INTRAVENOUS | Status: AC | PRN
  Administered 2018-03-28: 7.5 mL via INTRAVENOUS

## 2018-04-04 ENCOUNTER — Telehealth: Payer: Self-pay | Admitting: *Deleted

## 2018-04-04 NOTE — Telephone Encounter (Signed)
Patient's daughter called for results of MRI. Please advise. Landis Gandy, RN

## 2018-04-04 NOTE — Telephone Encounter (Signed)
I saw it and it shows still findings c/w osteomyelitis but there is NOTHING NEW so I would prefer to have him come back to clinic and see how he is doing. If he is worsening then we need him seen much sooner but there is nothing on the MRI really to have a surgeon or someone drain, biopsy or intervene on. HOw is he doing clinically?

## 2018-04-07 NOTE — Telephone Encounter (Addendum)
Patient daughter called to get the results or recent MRI. Advised her of Dr Tommy Medal response and he will keep his

## 2018-04-21 ENCOUNTER — Ambulatory Visit (INDEPENDENT_AMBULATORY_CARE_PROVIDER_SITE_OTHER): Payer: Medicare Other | Admitting: Infectious Disease

## 2018-04-21 ENCOUNTER — Encounter: Payer: Self-pay | Admitting: Infectious Disease

## 2018-04-21 VITALS — BP 154/82 | HR 98 | Temp 97.4°F | Wt 149.0 lb

## 2018-04-21 DIAGNOSIS — A498 Other bacterial infections of unspecified site: Secondary | ICD-10-CM

## 2018-04-21 DIAGNOSIS — A414 Sepsis due to anaerobes: Secondary | ICD-10-CM | POA: Diagnosis not present

## 2018-04-21 DIAGNOSIS — L02416 Cutaneous abscess of left lower limb: Secondary | ICD-10-CM

## 2018-04-21 DIAGNOSIS — M86659 Other chronic osteomyelitis, unspecified thigh: Secondary | ICD-10-CM | POA: Insufficient documentation

## 2018-04-21 DIAGNOSIS — K6812 Psoas muscle abscess: Secondary | ICD-10-CM | POA: Diagnosis not present

## 2018-04-21 DIAGNOSIS — B952 Enterococcus as the cause of diseases classified elsewhere: Secondary | ICD-10-CM

## 2018-04-21 DIAGNOSIS — M86651 Other chronic osteomyelitis, right thigh: Secondary | ICD-10-CM

## 2018-04-21 HISTORY — DX: Other chronic osteomyelitis, unspecified thigh: M86.659

## 2018-04-21 NOTE — Progress Notes (Signed)
Chief complaint follow-up for thigh and rectus abscesses as well as pelvic osteomyelitis  Subjective:    Patient ID: Anthony Dunlap, male    DOB: 1933/04/05, 85 y.o.   MRN: [id number]  HPI  Anthony Dunlap is a 85 y.o. 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  He is currently residing in a skilled nursing facility and on Zosyn rather than Unasyn.  I do not know why this change was made as his microbes were covered by the Unasyn.  In any case the abscesses have resolved radiographically and the drains been pulled by interventional radiology.  He says he does not have hip pain he is able to walk with a walker though his ability to walk is somewhat limited.  When we checked inflammatory markers at last visit they were still elevated. MRI showed resolution of abscesses. There persistent abnormalities in the bilateral pubic rami and symphysis pubis consistent with osteomyelitis.  Patient does continue to have pain more in his right side than his left but is not been worsening over time but holding steady.  Is more when he bears weight.  He overall feels much better than he did before.  Past Medical History:  Diagnosis Date  . Diabetes ([place])   . Left eye pain   . Prostate cancer [facility])     Past Surgical History:  Procedure Laterality Date  . HERNIA REPAIR    . IR RADIOLOGIST EVAL & MGMT  03/06/2018  . PROSTATE SURGERY      Family History  Problem Relation Age of Onset  . Pneumonia Father   . Cancer Sister       Social History   Socioeconomic History  . Marital status: Married    Spouse name: Anthony Dunlap  . Number of children: 3  . Years of education: 9th  . Highest education level: Not on file  Occupational History    Employer: RETIRED    Comment: Retired  Scientific laboratory technician  .  Financial resource strain: Not on file  . Food insecurity:    Worry: Not on file    Inability: Not on file  . Transportation needs:    Medical: Not on file    Non-medical: Not on file  Tobacco Use  . Smoking status: Never Smoker  . Smokeless tobacco: Never Used  Substance and Sexual Activity  . Alcohol use: No  . Drug use: No  . Sexual activity: Not on file  Lifestyle  . Physical activity:    Days per week: Not on file    Minutes per session: Not on file  . Stress: Not on file  Relationships  . Social connections:    Talks on phone: Not on file    Gets together: Not on file    Attends religious service: Not on file    Active member of club or organization: Not on file    Attends meetings of clubs or organizations: Not on file    Relationship status: Not on file  Other Topics Concern  . Not on file  Social History Narrative   Patient lives at home with his wife. Anthony Dunlap) . Patient is retired.   Education 9th grade.   Right handed.   Caffeine None    No Known Allergies   Current  Outpatient Medications:  .  acetaminophen (TYLENOL) 325 MG tablet, Take 2 tablets (650 mg total) by mouth every 6 (six) hours as needed for mild pain, fever or headache., Disp: , Rfl:  .  ampicillin-sulbactam (UNASYN) IVPB, Inject 3 g into the vein every 6 (six) hours. Indication:  abscesses and pelvic osteo  Last Day of Therapy:  03/25/2018 Labs - Once weekly:  CBC/D and BMP, Labs - Every other week:  ESR and CRP, Disp: 120 Units, Rfl: 0 .  aspirin EC 81 MG tablet, Take 81 mg by mouth daily., Disp: , Rfl:  .  ferrous sulfate 325 (65 FE) MG tablet, Take 1 tablet (325 mg total) by mouth daily with breakfast., Disp: , Rfl: 3 .  insulin aspart (NOVOLOG) 100 UNIT/ML injection, Inject 0-15 Units into the skin 3 (three) times daily with meals. Sliding scale insulin Less than 70 initiate hypoglycemia protocol 70-120  0 units 120-150 2 unit 151-200 3 units 201-250 3 units 251-300 5 units 301-350 8 units  351-400 11 units  Greater than 400 15 units , call MD, Disp: 10 mL, Rfl: 11 .  LEVEMIR FLEXTOUCH 100 UNIT/ML Pen, Inject 20 Units into the skin daily., Disp: 15 mL, Rfl: 3 .  levothyroxine (SYNTHROID, LEVOTHROID) 50 MCG tablet, Take 1 tablet (50 mcg total) by mouth daily at 6 (six) AM., Disp: , Rfl:  .  magnesium oxide (MAG-OX) 400 (241.3 Mg) MG tablet, Take 1 tablet (400 mg total) by mouth daily., Disp: , Rfl:  .  Multiple Vitamin (MULTIVITAMIN WITH MINERALS) TABS tablet, Take 1 tablet by mouth daily., Disp: , Rfl:  .  nutrition supplement, JUVEN, (JUVEN) PACK, Take 1 packet by mouth 2 (two) times daily between meals., Disp: , Rfl: 0 .  pravastatin (PRAVACHOL) 10 MG tablet, Take 10 mg by mouth daily., Disp: , Rfl:   Review of Systems  Constitutional: Negative for chills and fever.  HENT: Negative for congestion and sore throat.   Eyes: Negative for photophobia.  Respiratory: Negative for cough, shortness of breath and wheezing.   Cardiovascular: Negative for chest pain, palpitations and leg swelling.  Gastrointestinal: Negative for abdominal pain, blood in stool, constipation, diarrhea, nausea and vomiting.  Genitourinary: Negative for dysuria, flank pain and hematuria.  Musculoskeletal: Negative for back pain and myalgias.  Skin: Negative for rash.  Neurological: Positive for weakness. Negative for dizziness and headaches.  Hematological: Does not bruise/bleed easily.  Psychiatric/Behavioral: Negative for suicidal ideas.       Objective:   Physical Exam Constitutional:      General: He is not in acute distress.    Appearance: Normal appearance. He is well-developed. He is not ill-appearing or diaphoretic.  HENT:     Head: Normocephalic and atraumatic.     Right Ear: Hearing and external ear normal.     Left Ear: Hearing and external ear normal.     Nose: No nasal deformity or rhinorrhea.  Eyes:     General: No scleral icterus.    Conjunctiva/sclera: Conjunctivae normal.      Right eye: Right conjunctiva is not injected.     Left eye: Left conjunctiva is not injected.     Pupils: Pupils are equal, round, and reactive to light.  Neck:     Musculoskeletal: Normal range of motion and neck supple.     Vascular: No JVD.  Cardiovascular:     Rate and Rhythm: Normal rate and regular rhythm.     Heart sounds: Normal heart sounds, S1 normal and  S2 normal. No murmur. No friction rub.  Abdominal:     General: Bowel sounds are normal. There is no distension.     Palpations: Abdomen is soft.     Tenderness: There is no abdominal tenderness.  Musculoskeletal:     Right shoulder: Normal.     Left shoulder: Normal.     Right hip: He exhibits decreased range of motion. He exhibits normal strength, no tenderness, no bony tenderness and no laceration.     Left hip: He exhibits decreased range of motion. He exhibits no tenderness, no bony tenderness, no swelling, no crepitus, no deformity and no laceration.     Right knee: Normal.     Left knee: Normal.       Legs:  Lymphadenopathy:     Head:     Right side of head: No submandibular, preauricular or posterior auricular adenopathy.     Left side of head: No submandibular, preauricular or posterior auricular adenopathy.     Cervical: No cervical adenopathy.     Right cervical: No superficial or deep cervical adenopathy.    Left cervical: No superficial or deep cervical adenopathy.  Skin:    General: Skin is warm and dry.     Coloration: Skin is not pale.     Findings: No abrasion, bruising, ecchymosis, erythema, lesion or rash.     Nails: There is no clubbing.   Neurological:     Mental Status: He is alert and oriented to person, place, and time.     Sensory: No sensory deficit.     Coordination: Coordination normal.     Gait: Gait normal.  Psychiatric:        Attention and Perception: He is attentive.        Mood and Affect: Mood normal.        Speech: Speech normal.        Behavior: Behavior normal. Behavior is  cooperative.        Judgment: Judgment normal.     Wounds from prior drains are all healed up well.      Assessment & Plan:  AMARIYON MAYNES is a 85 y.o. 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  We will recheck his inflammatory markers today.  If they are not improving we can contemplate reimaging with MRI.  I am not really anxious to reinitiate him on antimicrobials as I do not know if there is going to make a big deal of difference and they are not really any good surgical options.

## 2018-04-22 ENCOUNTER — Ambulatory Visit (INDEPENDENT_AMBULATORY_CARE_PROVIDER_SITE_OTHER): Payer: Medicare Other | Admitting: Urology

## 2018-04-22 DIAGNOSIS — R338 Other retention of urine: Secondary | ICD-10-CM

## 2018-04-22 LAB — BASIC METABOLIC PANEL WITH GFR
BUN: 23 mg/dL (ref 7–25)
CALCIUM: 9.5 mg/dL (ref 8.6–10.3)
CO2: 25 mmol/L (ref 20–32)
Chloride: 101 mmol/L (ref 98–110)
Creat: 0.88 mg/dL (ref 0.70–1.11)
GFR, Est African American: 92 mL/min/{1.73_m2} (ref >=60)
GFR, Est Non African American: 79 mL/min/{1.73_m2} (ref >=60)
Glucose, Bld: 163 mg/dL — ABNORMAL HIGH (ref 65–99)
Potassium: 4.5 mmol/L (ref 3.5–5.3)
Sodium: 138 mmol/L (ref 135–146)

## 2018-04-22 LAB — CBC WITH DIFFERENTIAL/PLATELET
Absolute Monocytes: 510 cells/uL (ref 200–950)
Basophils Absolute: 44 cells/uL (ref 0–200)
Basophils Relative: 0.5 %
EOS PCT: 3 %
Eosinophils Absolute: 264 cells/uL (ref 15–500)
HCT: 34.9 % — ABNORMAL LOW (ref 38.5–50.0)
Hemoglobin: 11.2 g/dL — ABNORMAL LOW (ref 13.2–17.1)
Lymphs Abs: 2587 cells/uL (ref 850–3900)
MCH: 26.2 pg — ABNORMAL LOW (ref 27.0–33.0)
MCHC: 32.1 g/dL (ref 32.0–36.0)
MCV: 81.7 fL (ref 80.0–100.0)
MONOS PCT: 5.8 %
MPV: 10.3 fL (ref 7.5–12.5)
Neutro Abs: 5394 cells/uL (ref 1500–7800)
Neutrophils Relative %: 61.3 %
Platelets: 479 10*3/uL — ABNORMAL HIGH (ref 140–400)
RBC: 4.27 10*6/uL (ref 4.20–5.80)
RDW: 14.8 % (ref 11.0–15.0)
Total Lymphocyte: 29.4 %
WBC: 8.8 10*3/uL (ref 3.8–10.8)

## 2018-04-22 LAB — C-REACTIVE PROTEIN: CRP: 6.1 mg/L (ref <8.0)

## 2018-04-22 LAB — SEDIMENTATION RATE: SED RATE: 38 mm/h — AB (ref 0–20)

## 2018-05-08 ENCOUNTER — Encounter (HOSPITAL_COMMUNITY): Payer: Self-pay | Admitting: Emergency Medicine

## 2018-05-08 ENCOUNTER — Emergency Department (HOSPITAL_COMMUNITY): Payer: Medicare Other

## 2018-05-08 ENCOUNTER — Other Ambulatory Visit: Payer: Self-pay

## 2018-05-08 ENCOUNTER — Inpatient Hospital Stay (HOSPITAL_COMMUNITY)
Admission: EM | Admit: 2018-05-08 | Discharge: 2018-05-14 | DRG: 698 | Disposition: A | Discharge status: Home Health | Payer: Medicare Other | Attending: Internal Medicine | Admitting: Internal Medicine

## 2018-05-08 ENCOUNTER — Emergency Department (HOSPITAL_COMMUNITY)
Admission: EM | Admit: 2018-05-08 | Discharge: 2018-05-08 | Disposition: A | Discharge status: Home / Self Care | Payer: Medicare Other | Source: Home / Self Care | Attending: Emergency Medicine | Admitting: Emergency Medicine

## 2018-05-08 DIAGNOSIS — T83511A Infection and inflammatory reaction due to indwelling urethral catheter, initial encounter: Secondary | ICD-10-CM | POA: Diagnosis not present

## 2018-05-08 DIAGNOSIS — B372 Candidiasis of skin and nail: Secondary | ICD-10-CM | POA: Diagnosis present

## 2018-05-08 DIAGNOSIS — K8020 Calculus of gallbladder without cholecystitis without obstruction: Secondary | ICD-10-CM

## 2018-05-08 DIAGNOSIS — Z794 Long term (current) use of insulin: Secondary | ICD-10-CM

## 2018-05-08 DIAGNOSIS — D649 Anemia, unspecified: Secondary | ICD-10-CM | POA: Diagnosis present

## 2018-05-08 DIAGNOSIS — T839XXA Unspecified complication of genitourinary prosthetic device, implant and graft, initial encounter: Secondary | ICD-10-CM

## 2018-05-08 DIAGNOSIS — Z7982 Long term (current) use of aspirin: Secondary | ICD-10-CM

## 2018-05-08 DIAGNOSIS — Z960 Presence of urogenital implants: Secondary | ICD-10-CM

## 2018-05-08 DIAGNOSIS — N390 Urinary tract infection, site not specified: Secondary | ICD-10-CM | POA: Diagnosis not present

## 2018-05-08 DIAGNOSIS — R339 Retention of urine, unspecified: Secondary | ICD-10-CM

## 2018-05-08 DIAGNOSIS — D075 Carcinoma in situ of prostate: Secondary | ICD-10-CM | POA: Insufficient documentation

## 2018-05-08 DIAGNOSIS — Y69 Unspecified misadventure during surgical and medical care: Secondary | ICD-10-CM

## 2018-05-08 DIAGNOSIS — Y846 Urinary catheterization as the cause of abnormal reaction of the patient, or of later complication, without mention of misadventure at the time of the procedure: Secondary | ICD-10-CM | POA: Diagnosis present

## 2018-05-08 DIAGNOSIS — Z79899 Other long term (current) drug therapy: Secondary | ICD-10-CM | POA: Diagnosis not present

## 2018-05-08 DIAGNOSIS — B377 Candidal sepsis: Secondary | ICD-10-CM | POA: Diagnosis present

## 2018-05-08 DIAGNOSIS — R945 Abnormal results of liver function studies: Secondary | ICD-10-CM

## 2018-05-08 DIAGNOSIS — Z79890 Hormone replacement therapy (postmenopausal): Secondary | ICD-10-CM | POA: Diagnosis not present

## 2018-05-08 DIAGNOSIS — T83518A Infection and inflammatory reaction due to other urinary catheter, initial encounter: Secondary | ICD-10-CM | POA: Diagnosis present

## 2018-05-08 DIAGNOSIS — R5381 Other malaise: Secondary | ICD-10-CM | POA: Diagnosis present

## 2018-05-08 DIAGNOSIS — E785 Hyperlipidemia, unspecified: Secondary | ICD-10-CM | POA: Diagnosis present

## 2018-05-08 DIAGNOSIS — M8669 Other chronic osteomyelitis, multiple sites: Secondary | ICD-10-CM | POA: Diagnosis present

## 2018-05-08 DIAGNOSIS — Z978 Presence of other specified devices: Secondary | ICD-10-CM

## 2018-05-08 DIAGNOSIS — B49 Unspecified mycosis: Secondary | ICD-10-CM | POA: Diagnosis not present

## 2018-05-08 DIAGNOSIS — E119 Type 2 diabetes mellitus without complications: Secondary | ICD-10-CM

## 2018-05-08 DIAGNOSIS — B965 Pseudomonas (aeruginosa) (mallei) (pseudomallei) as the cause of diseases classified elsewhere: Secondary | ICD-10-CM | POA: Diagnosis present

## 2018-05-08 DIAGNOSIS — N3001 Acute cystitis with hematuria: Secondary | ICD-10-CM | POA: Diagnosis present

## 2018-05-08 DIAGNOSIS — R7989 Other specified abnormal findings of blood chemistry: Secondary | ICD-10-CM

## 2018-05-08 DIAGNOSIS — E039 Hypothyroidism, unspecified: Secondary | ICD-10-CM | POA: Insufficient documentation

## 2018-05-08 DIAGNOSIS — R52 Pain, unspecified: Secondary | ICD-10-CM

## 2018-05-08 DIAGNOSIS — Z8546 Personal history of malignant neoplasm of prostate: Secondary | ICD-10-CM

## 2018-05-08 DIAGNOSIS — E11649 Type 2 diabetes mellitus with hypoglycemia without coma: Secondary | ICD-10-CM | POA: Diagnosis present

## 2018-05-08 LAB — URINALYSIS, ROUTINE W REFLEX MICROSCOPIC
BILIRUBIN URINE: NEGATIVE
Bacteria, UA: NONE SEEN
Bacteria, UA: NONE SEEN
Bilirubin Urine: NEGATIVE
Bilirubin Urine: NEGATIVE
Glucose, UA: NEGATIVE mg/dL
Glucose, UA: NEGATIVE mg/dL
Glucose, UA: NEGATIVE mg/dL
Ketones, ur: NEGATIVE mg/dL
Ketones, ur: NEGATIVE mg/dL
Ketones, ur: NEGATIVE mg/dL
Nitrite: NEGATIVE
Nitrite: NEGATIVE
Nitrite: NEGATIVE
Protein, ur: NEGATIVE mg/dL
Protein, ur: 100 mg/dL — AB
Protein, ur: 100 mg/dL — AB
RBC / HPF: >50 RBC/hpf — ABNORMAL HIGH (ref 0–5)
RBC / HPF: >50 RBC/hpf — ABNORMAL HIGH (ref 0–5)
RBC / HPF: >50 RBC/hpf — ABNORMAL HIGH (ref 0–5)
Specific Gravity, Urine: 1.004 — ABNORMAL LOW (ref 1.005–1.030)
Specific Gravity, Urine: 1.008 (ref 1.005–1.030)
Specific Gravity, Urine: 1.006 (ref 1.005–1.030)
WBC, UA: >50 WBC/hpf — ABNORMAL HIGH (ref 0–5)
WBC, UA: >50 WBC/hpf — ABNORMAL HIGH (ref 0–5)
pH: 8 (ref 5.0–8.0)
pH: 6 (ref 5.0–8.0)
pH: 7 (ref 5.0–8.0)

## 2018-05-08 LAB — CBC WITH DIFFERENTIAL/PLATELET
ABS IMMATURE GRANULOCYTES: 0.04 10*3/uL (ref 0.00–0.07)
Abs Immature Granulocytes: 0.05 10*3/uL (ref 0.00–0.07)
BASOS PCT: 0 %
Basophils Absolute: 0 10*3/uL (ref 0.0–0.1)
Basophils Absolute: 0 10*3/uL (ref 0.0–0.1)
Basophils Relative: 0 %
Eosinophils Absolute: 0 10*3/uL (ref 0.0–0.5)
Eosinophils Absolute: 0 10*3/uL (ref 0.0–0.5)
Eosinophils Relative: 0 %
Eosinophils Relative: 0 %
HCT: 30.1 % — ABNORMAL LOW (ref 39.0–52.0)
HCT: 30.2 % — ABNORMAL LOW (ref 39.0–52.0)
Hemoglobin: 9.4 g/dL — ABNORMAL LOW (ref 13.0–17.0)
Hemoglobin: 9.5 g/dL — ABNORMAL LOW (ref 13.0–17.0)
Immature Granulocytes: 0 %
Immature Granulocytes: 0 %
Lymphocytes Relative: 1 %
Lymphocytes Relative: 1 %
Lymphs Abs: 0.1 10*3/uL — ABNORMAL LOW (ref 0.7–4.0)
Lymphs Abs: 0.1 10*3/uL — ABNORMAL LOW (ref 0.7–4.0)
MCH: 25.6 pg — ABNORMAL LOW (ref 26.0–34.0)
MCH: 25.7 pg — ABNORMAL LOW (ref 26.0–34.0)
MCHC: 31.5 g/dL (ref 30.0–36.0)
MCHC: 31.2 g/dL (ref 30.0–36.0)
MCV: 81.8 fL (ref 80.0–100.0)
MCV: 82 fL (ref 80.0–100.0)
MONOS PCT: 3 %
Monocytes Absolute: 0.3 10*3/uL (ref 0.1–1.0)
Monocytes Absolute: 0.3 10*3/uL (ref 0.1–1.0)
Monocytes Relative: 3 %
NEUTROS ABS: 11.7 10*3/uL — AB (ref 1.7–7.7)
Neutro Abs: 11.7 10*3/uL — ABNORMAL HIGH (ref 1.7–7.7)
Neutrophils Relative %: 96 %
Neutrophils Relative %: 96 %
Platelets: 354 10*3/uL (ref 150–400)
Platelets: 357 10*3/uL (ref 150–400)
RBC: 3.67 MIL/uL — ABNORMAL LOW (ref 4.22–5.81)
RBC: 3.69 MIL/uL — ABNORMAL LOW (ref 4.22–5.81)
RDW: 14.3 % (ref 11.5–15.5)
RDW: 14.4 % (ref 11.5–15.5)
WBC: 12.2 10*3/uL — ABNORMAL HIGH (ref 4.0–10.5)
WBC: 12.2 10*3/uL — ABNORMAL HIGH (ref 4.0–10.5)
nRBC: 0 % (ref 0.0–0.2)
nRBC: 0 % (ref 0.0–0.2)

## 2018-05-08 LAB — COMPREHENSIVE METABOLIC PANEL
ALBUMIN: 2.9 g/dL — AB (ref 3.5–5.0)
ALT: 29 U/L (ref 0–44)
ALT: 30 U/L (ref 0–44)
ANION GAP: 12 (ref 5–15)
AST: 50 U/L — ABNORMAL HIGH (ref 15–41)
AST: 51 U/L — ABNORMAL HIGH (ref 15–41)
Albumin: 2.9 g/dL — ABNORMAL LOW (ref 3.5–5.0)
Alkaline Phosphatase: 121 U/L (ref 38–126)
Alkaline Phosphatase: 122 U/L (ref 38–126)
Anion gap: 9 (ref 5–15)
BUN: 20 mg/dL (ref 8–23)
BUN: 20 mg/dL (ref 8–23)
CO2: 20 mmol/L — ABNORMAL LOW (ref 22–32)
CO2: 20 mmol/L — ABNORMAL LOW (ref 22–32)
Calcium: 8.3 mg/dL — ABNORMAL LOW (ref 8.9–10.3)
Calcium: 8.3 mg/dL — ABNORMAL LOW (ref 8.9–10.3)
Chloride: 101 mmol/L (ref 98–111)
Chloride: 105 mmol/L (ref 98–111)
Creatinine, Ser: 0.81 mg/dL (ref 0.61–1.24)
Creatinine, Ser: 0.85 mg/dL (ref 0.61–1.24)
GFR calc Af Amer: >60 mL/min (ref >60)
GFR calc non Af Amer: >60 mL/min (ref >60)
GFR calc non Af Amer: >60 mL/min (ref >60)
GLUCOSE: 177 mg/dL — AB (ref 70–99)
Glucose, Bld: 175 mg/dL — ABNORMAL HIGH (ref 70–99)
Potassium: 3.6 mmol/L (ref 3.5–5.1)
Potassium: 3.7 mmol/L (ref 3.5–5.1)
Sodium: 133 mmol/L — ABNORMAL LOW (ref 135–145)
Sodium: 134 mmol/L — ABNORMAL LOW (ref 135–145)
Total Bilirubin: 0.6 mg/dL (ref 0.3–1.2)
Total Bilirubin: 0.4 mg/dL (ref 0.3–1.2)
Total Protein: 6.9 g/dL (ref 6.5–8.1)
Total Protein: 7.1 g/dL (ref 6.5–8.1)

## 2018-05-08 LAB — GLUCOSE, CAPILLARY
Glucose-Capillary: 76 mg/dL (ref 70–99)
Glucose-Capillary: 117 mg/dL — ABNORMAL HIGH (ref 70–99)

## 2018-05-08 LAB — LACTIC ACID, PLASMA
LACTIC ACID, VENOUS: 2.1 mmol/L — AB (ref 0.5–1.9)
Lactic Acid, Venous: 2.3 mmol/L (ref 0.5–1.9)
Lactic Acid, Venous: 1.9 mmol/L (ref 0.5–1.9)

## 2018-05-08 LAB — PROTIME-INR
INR: 1.1 (ref 0.8–1.2)
Prothrombin Time: 13.9 seconds (ref 11.4–15.2)

## 2018-05-08 MED ORDER — PRAVASTATIN SODIUM 10 MG PO TABS
10.0000 mg | ORAL_TABLET | Freq: Every day | ORAL | Status: DC
  Administered 2018-05-08 – 2018-05-13 (×6): 10 mg via ORAL
  Filled 2018-05-08 (×6): qty 1

## 2018-05-08 MED ORDER — ONDANSETRON HCL 4 MG/2ML IJ SOLN: 4.0000 mg | Freq: Four times a day (QID) | INTRAMUSCULAR | Status: DC | PRN

## 2018-05-08 MED ORDER — ALBUTEROL SULFATE (2.5 MG/3ML) 0.083% IN NEBU: 2.5000 mg | INHALATION_SOLUTION | RESPIRATORY_TRACT | Status: DC | PRN

## 2018-05-08 MED ORDER — TRAZODONE HCL 50 MG PO TABS
50.0000 mg | ORAL_TABLET | Freq: Every evening | ORAL | Status: DC | PRN
  Administered 2018-05-12: 50 mg via ORAL
  Filled 2018-05-08: qty 1

## 2018-05-08 MED ORDER — ADULT MULTIVITAMIN W/MINERALS CH
1.0000 | ORAL_TABLET | Freq: Every day | ORAL | Status: DC
  Administered 2018-05-09 – 2018-05-14 (×6): 1 via ORAL
  Filled 2018-05-08 (×6): qty 1

## 2018-05-08 MED ORDER — JUVEN PO PACK
1.0000 | PACK | Freq: Two times a day (BID) | ORAL | Status: DC
  Administered 2018-05-09 – 2018-05-14 (×11): 1 via ORAL
  Filled 2018-05-08 (×12): qty 1

## 2018-05-08 MED ORDER — ACETAMINOPHEN 325 MG PO TABS: 650.0000 mg | ORAL_TABLET | Freq: Four times a day (QID) | ORAL | Status: DC | PRN

## 2018-05-08 MED ORDER — FLUCONAZOLE IN SODIUM CHLORIDE 200-0.9 MG/100ML-% IV SOLN
200.0000 mg | INTRAVENOUS | Status: DC
  Administered 2018-05-08 – 2018-05-09 (×2): 200 mg via INTRAVENOUS
  Filled 2018-05-08 (×5): qty 100

## 2018-05-08 MED ORDER — FERROUS SULFATE 325 (65 FE) MG PO TABS
325.0000 mg | ORAL_TABLET | Freq: Every day | ORAL | Status: DC
  Administered 2018-05-09 – 2018-05-14 (×6): 325 mg via ORAL
  Filled 2018-05-08 (×6): qty 1

## 2018-05-08 MED ORDER — ONDANSETRON HCL 4 MG PO TABS: 4.0000 mg | ORAL_TABLET | Freq: Four times a day (QID) | ORAL | Status: DC | PRN

## 2018-05-08 MED ORDER — INSULIN ASPART 100 UNIT/ML ~~LOC~~ SOLN
0.0000 [IU] | Freq: Every day | SUBCUTANEOUS | Status: DC
  Administered 2018-05-13: 2 [IU] via SUBCUTANEOUS

## 2018-05-08 MED ORDER — SODIUM CHLORIDE 0.9% FLUSH: 3.0000 mL | Freq: Once | INTRAVENOUS | Status: DC

## 2018-05-08 MED ORDER — NYSTATIN 100000 UNIT/GM EX POWD
Freq: Two times a day (BID) | CUTANEOUS | Status: DC
  Administered 2018-05-08 – 2018-05-14 (×12): via TOPICAL
  Filled 2018-05-08 (×3): qty 15

## 2018-05-08 MED ORDER — SODIUM CHLORIDE 0.9 % IV SOLN
1.0000 g | INTRAVENOUS | Status: DC
  Administered 2018-05-09 – 2018-05-10 (×2): 1 g via INTRAVENOUS
  Filled 2018-05-08 (×2): qty 10

## 2018-05-08 MED ORDER — POLYETHYLENE GLYCOL 3350 17 G PO PACK: 17.0000 g | PACK | Freq: Every day | ORAL | Status: DC | PRN

## 2018-05-08 MED ORDER — GLIMEPIRIDE 2 MG PO TABS
4.0000 mg | ORAL_TABLET | Freq: Every day | ORAL | Status: DC
  Administered 2018-05-09 – 2018-05-14 (×6): 4 mg via ORAL
  Filled 2018-05-08 (×6): qty 2

## 2018-05-08 MED ORDER — SODIUM CHLORIDE 0.9 % IV SOLN: 250.0000 mL | INTRAVENOUS | Status: DC | PRN

## 2018-05-08 MED ORDER — MAGNESIUM OXIDE 400 (241.3 MG) MG PO TABS
400.0000 mg | ORAL_TABLET | Freq: Every day | ORAL | Status: DC
  Administered 2018-05-09 – 2018-05-14 (×6): 400 mg via ORAL
  Filled 2018-05-08 (×6): qty 1

## 2018-05-08 MED ORDER — LEVOTHYROXINE SODIUM 100 MCG PO TABS
100.0000 ug | ORAL_TABLET | Freq: Every day | ORAL | Status: DC
  Administered 2018-05-09 – 2018-05-14 (×6): 100 ug via ORAL
  Filled 2018-05-08 (×6): qty 1

## 2018-05-08 MED ORDER — HEPARIN SODIUM (PORCINE) 5000 UNIT/ML IJ SOLN
5000.0000 [IU] | Freq: Three times a day (TID) | INTRAMUSCULAR | Status: DC
  Administered 2018-05-08 – 2018-05-14 (×18): 5000 [IU] via SUBCUTANEOUS
  Filled 2018-05-08 (×18): qty 1

## 2018-05-08 MED ORDER — ASPIRIN EC 81 MG PO TBEC
81.0000 mg | DELAYED_RELEASE_TABLET | Freq: Every day | ORAL | Status: DC
  Administered 2018-05-09 – 2018-05-14 (×6): 81 mg via ORAL
  Filled 2018-05-08 (×6): qty 1

## 2018-05-08 MED ORDER — FLUCONAZOLE IN SODIUM CHLORIDE 200-0.9 MG/100ML-% IV SOLN
200.0000 mg | INTRAVENOUS | Status: DC
  Filled 2018-05-08 (×2): qty 100

## 2018-05-08 MED ORDER — SODIUM CHLORIDE 0.9 % IV BOLUS
2000.0000 mL | Freq: Once | INTRAVENOUS | Status: AC
  Administered 2018-05-08: 2000 mL via INTRAVENOUS

## 2018-05-08 MED ORDER — SODIUM CHLORIDE 0.9% FLUSH: 3.0000 mL | INTRAVENOUS | Status: DC | PRN

## 2018-05-08 MED ORDER — INSULIN ASPART 100 UNIT/ML ~~LOC~~ SOLN
0.0000 [IU] | Freq: Three times a day (TID) | SUBCUTANEOUS | Status: DC
  Administered 2018-05-09: 5 [IU] via SUBCUTANEOUS
  Administered 2018-05-09: 3 [IU] via SUBCUTANEOUS
  Administered 2018-05-11: 2 [IU] via SUBCUTANEOUS
  Administered 2018-05-12: 1 [IU] via SUBCUTANEOUS
  Administered 2018-05-12: 2 [IU] via SUBCUTANEOUS
  Administered 2018-05-12: 3 [IU] via SUBCUTANEOUS
  Administered 2018-05-13 (×2): 5 [IU] via SUBCUTANEOUS
  Administered 2018-05-13: 1 [IU] via SUBCUTANEOUS
  Administered 2018-05-14: 3 [IU] via SUBCUTANEOUS
  Administered 2018-05-14 (×2): 9 [IU] via SUBCUTANEOUS

## 2018-05-08 MED ORDER — SODIUM CHLORIDE 0.9 % IV SOLN
1.0000 g | Freq: Once | INTRAVENOUS | Status: AC
  Administered 2018-05-08: 1 g via INTRAVENOUS
  Filled 2018-05-08: qty 10

## 2018-05-08 MED ORDER — ACETAMINOPHEN 650 MG RE SUPP: 650.0000 mg | Freq: Four times a day (QID) | RECTAL | Status: DC | PRN

## 2018-05-08 MED ORDER — NYSTATIN 100000 UNIT/GM EX POWD: Freq: Four times a day (QID) | CUTANEOUS | 2 refills | Status: DC

## 2018-05-08 MED ORDER — SODIUM CHLORIDE 0.9 % IV BOLUS
2100.0000 mL | Freq: Once | INTRAVENOUS | Status: AC
  Administered 2018-05-08: 2100 mL via INTRAVENOUS

## 2018-05-08 MED ORDER — SODIUM CHLORIDE 0.9% FLUSH
3.0000 mL | Freq: Two times a day (BID) | INTRAVENOUS | Status: DC
  Administered 2018-05-09 – 2018-05-14 (×7): 3 mL via INTRAVENOUS

## 2018-05-08 MED ORDER — INSULIN DETEMIR 100 UNIT/ML ~~LOC~~ SOLN
18.0000 [IU] | Freq: Every day | SUBCUTANEOUS | Status: DC
  Administered 2018-05-09: 18 [IU] via SUBCUTANEOUS
  Filled 2018-05-08 (×4): qty 0.18

## 2018-05-08 MED ORDER — SODIUM CHLORIDE 0.9 % IV SOLN: INTRAVENOUS | Status: DC

## 2018-05-08 NOTE — ED Notes (Signed)
Pt having large amount of watery diarrhea, EDP advised, Enteric precautions stated

## 2018-05-08 NOTE — ED Notes (Signed)
Post void residual urine measured on Bladder Scan. 69ml residual urine measured.

## 2018-05-08 NOTE — H&P (Addendum)
Patient Demographics:    Anthony Dunlap, is a 85 y.o. male  MRN: [id number]   DOB - 05-28-1933  Admit Date - 05/08/2018  Outpatient Primary MD for the patient is [name], MD   Assessment & Plan:    Principal Problem:   Complicated UTI (urinary tract infection) Active Problems:   Type 2 diabetes mellitus ([name])   Chronic anemia   Chronic indwelling Foley catheter   Hypothyroidism   UTI (urinary tract infection)    1)SIRS secondary to presumed complicated UTI--- with fever above 101, tachycardia, leukocytosis, overall hemodynamically stable at this time, patient with chronically indwelling Foley catheter last changed 2 weeks ago, treat empirically with IV Rocephin pending cultures, IV fluid boluses per sepsis protocol, repeat lactic acid pending, follow CBC and urine and blood cultures  2)DM2--no frank DKA, as A1c was 9.6 in November 2019, reflecting poor diabetic control.anion gap between 9 and 12, continue Amaryl 4 mg with breakfast, give Levemir 18 units daily, use Novolog/Humalog Sliding scale insulin with Accu-Cheks/Fingersticks as ordered  3) candidal intertrigo with possible Candida UTI, IV Diflucan as ordered--use topical nystatin cream to groin/scrotum/perineum  4) Chronic anemia--- hemoglobin currently 9.5 which is close to patient's previous baseline, continue iron supplementation  5)Hypothyroidism--- patient's TSH was 6.3 on February 11, 2018, continue levothyroxine 100 mcg dialy  6)HLD--stable, continue pravastatin 10 mg daily with aspirin 81 mg daily   Pt  was found to have temperature above 101, heart rate above 130, WBC above 12, in the setting of catheter associated UTI, please note that patient has failed outpatient Ciprofloxacillin recently, also failed outpatient Augmentin patient is  currently on Augmentin day # 4  Pt meets SIRS Criteria--- has failed 2 oral antibiotics he meets inpatient criteria   With History of - Reviewed by me  Past Medical History:  Diagnosis Date  . 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 Diabetes ([place])   . Left eye pain   . Prostate cancer [facility])       Past Surgical History:  Procedure Laterality Date  . HERNIA REPAIR    . IR RADIOLOGIST EVAL & MGMT  03/06/2018  . PROSTATE SURGERY       Chief Complaint  Patient presents with  . Fever      HPI:    Anthony Dunlap  is a 85 y.o. male history of chronic urinary retention with chronic indwelling Foley catheter in the setting of prior history of prostate cancer presents to the ED for the second time in 24 hours with urinary complaints  Patient was seen in the ED less than 24 hours ago with urinary catheter problems.... His catheter was apparently clogged it was last changed about 2 weeks ago, ED provider was able to unclog the catheter patient was sent home... He now returns with fevers to 101.3, lactic acidosis of 2.1, WBC of 12.2 tachycardia with heart rate up to the 130s  Patient was  recently treated with Cipro for presumed UTI, also recently treated and currently on Augmentin day 4 for presumed UTI... No recent urine culture results to help guide therapy at this time  Patient received IV fluids in the ED, repeat lactic acid up from 2.1-2.3, additional IV fluids ordered repeat lactic acid after additional fluids pending  In November and December 2019 patient had  previous Peptostreptococcus and Bacteroides infection with pelvic osteomyelitis of the pubic symphysis and abscess in the adductor sheath and left thigh status post IR placed drain--- required long-term antibiotic treatment with IV Zosyn and IV Unasyn  At this time patient denies hematuria, no chest pains no palpitations no dizziness no chills or rigors  No nausea no vomiting  Patient had  large volume diarrhea after taking MiraLAX at home  Additional history obtained from patient's longtime friend at bedside  Pt  was found to have temperature above 101, heart rate above 130, WBC above 12, in the setting of catheter associated UTI, please note that patient has failed outpatient Ciprofloxacillin recently, also failed outpatient Augmentin patient is currently on Augmentin day # 4 Pt meets SIRS Criteria--- has failed 2 oral antibiotics he meets inpatient criteria    Review of systems:    In addition to the HPI above,   A full Review of  Systems was done, all other systems reviewed are negative except as noted above in HPI , .    Social History:  Reviewed by me    Social History   Tobacco Use  . Smoking status: Never Smoker  . Smokeless tobacco: Never Used  Substance Use Topics  . Alcohol use: No    Family History :  Reviewed by me    Family History  Problem Relation Age of Onset  . Pneumonia Father   . Cancer Sister     Home Medications:   Prior to Admission medications   Medication Sig Start Date End Date Taking? Authorizing Provider  acetaminophen (TYLENOL) 325 MG tablet Take 2 tablets (650 mg total) by mouth every 6 (six) hours as needed for mild pain, fever or headache. 02/18/18  Yes Darrick Meigs, Marge Duncans, MD  amoxicillin-clavulanate (AUGMENTIN) 875-125 MG tablet Take 1 tablet by mouth 2 (two) times daily. 05/02/18  Yes [provider]  aspirin EC 81 MG tablet Take 81 mg by mouth daily.   Yes [provider]  ferrous sulfate 325 (65 FE) MG tablet Take 1 tablet (325 mg total) by mouth daily with breakfast. 02/19/18  Yes Darrick Meigs, Marge Duncans, MD  glimepiride (AMARYL) 4 MG tablet Take 1 tablet by mouth daily. 04/18/18  Yes [provider]  insulin aspart (NOVOLOG) 100 UNIT/ML injection Inject 0-15 Units into the skin 3 (three) times daily with meals. Sliding scale insulin Less than 70 initiate hypoglycemia protocol 70-120  0 units 120-150 2  unit 151-200 3 units 201-250 3 units 251-300 5 units 301-350 8 units 351-400 11 units  Greater than 400 15 units , call MD 02/18/18  Yes Darrick Meigs, Marge Duncans, MD  levothyroxine (SYNTHROID, LEVOTHROID) 100 MCG tablet Take 1 tablet by mouth daily. 04/18/18  Yes [provider]  magnesium oxide (MAG-OX) 400 (241.3 Mg) MG tablet Take 1 tablet (400 mg total) by mouth daily. 02/19/18  Yes Oswald Hillock, MD  meloxicam (MOBIC) 7.5 MG tablet Take 1 tablet by mouth 2 (two) times daily. 04/18/18  Yes [provider]  Multiple Vitamin (MULTIVITAMIN WITH MINERALS) TABS tablet Take 1 tablet by mouth daily. 02/19/18  Yes  Oswald Hillock, MD  nutrition supplement, JUVEN, (JUVEN) PACK Take 1 packet by mouth 2 (two) times daily between meals. 02/19/18  Yes Oswald Hillock, MD  nystatin (MYCOSTATIN/NYSTOP) powder Apply topically 4 (four) times daily. 05/08/18  Yes Mesner, Corene Cornea, MD  pravastatin (PRAVACHOL) 10 MG tablet Take 10 mg by mouth daily.   Yes [provider]  [place] 100-33 UNT-MCG/ML SOPN Inject 35-65 Units as directed daily. 04/28/18  Yes [provider]  ampicillin-sulbactam (UNASYN) IVPB Inject 3 g into the vein every 6 (six) hours. Indication:  abscesses and pelvic osteo  Last Day of Therapy:  03/25/2018 Labs - Once weekly:  CBC/D and BMP, Labs - Every other week:  ESR and CRP Patient not taking: Reported on 05/08/2018 02/18/18   Oswald Hillock, MD  LEVEMIR FLEXTOUCH 100 UNIT/ML Pen Inject 20 Units into the skin daily. Patient not taking: Reported on 05/08/2018 02/18/18   Oswald Hillock, MD     Allergies:    No Known Allergies   Physical Exam:   Vitals  Blood pressure (!) 151/83, pulse (!) 130, temperature 99.1 F (37.3 C), temperature source Oral, resp. rate 18, height _0  (1.753 m), weight 68.7 kg, SpO2 99 %.  Physical Examination: General appearance - alert, well appearing, and in no distress and  Mental status - alert, oriented to person, place, and time,  Eyes - sclera  anicteric Neck - supple, no JVD elevation , Chest - clear  to auscultation bilaterally, symmetrical air movement,  Heart - S1 and S2 normal, regular , tachycardic Abdomen - soft, nontender, nondistended, no masses or organomegaly, no CVA area tenderness Neurological - screening mental status exam normal, neck supple without rigidity, cranial nerves II through XII intact, DTR's normal and symmetric Extremities - no pedal edema noted, intact peripheral pulses  Skin -groin/scrotum/perineum with erythematous rash with satellite lesions consistent with candidal intertrigo GU--- Foley catheter with clear urine    Data Review:    CBC Recent Labs  Lab 05/08/18 1151 05/08/18 1152  WBC 12.2* 12.2*  HGB 9.4* 9.5*  HCT 30.1* 30.2*  PLT 354 357  MCV 82.0 81.8  MCH 25.6* 25.7*  MCHC 31.2 31.5  RDW 14.4 14.3  LYMPHSABS 0.1* 0.1*  MONOABS 0.3 0.3  EOSABS 0.0 0.0  BASOSABS 0.0 0.0   ------------------------------------------------------------------------------------------------------------------  Chemistries  Recent Labs  Lab 05/08/18 1151 05/08/18 1152  NA 134* 133*  K 3.7 3.6  CL 105 101  CO2 20* 20*  GLUCOSE 175* 177*  BUN 20 20  CREATININE 0.85 0.81  CALCIUM 8.3* 8.3*  AST 51* 50*  ALT 29 30  ALKPHOS 122 121  BILITOT 0.4 0.6   ------------------------------------------------------------------------------------------------------------------ estimated creatinine clearance is 67.1 mL/min (by C-G formula based on SCr of 0.81 mg/dL). ------------------------------------------------------------------------------------------------------------------ No results for input(s): TSH, T4TOTAL, T3FREE, THYROIDAB in the last 72 hours.  Invalid input(s): FREET3   Coagulation profile Recent Labs  Lab 05/08/18 1151  INR 1.1   ------------------------------------------------------------------------------------------------------------------- No results for input(s): DDIMER in the last  72 hours. -------------------------------------------------------------------------------------------------------------------  Cardiac Enzymes No results for input(s): CKMB, TROPONINI, MYOGLOBIN in the last 168 hours.  Invalid input(s): CK ------------------------------------------------------------------------------------------------------------------ No results found for: BNP   ---------------------------------------------------------------------------------------------------------------  Urinalysis    Component Value Date/Time   COLORURINE YELLOW 05/08/2018 1106   APPEARANCEUR CLOUDY (A) 05/08/2018 1106   LABSPEC 1.008 05/08/2018 1106   PHURINE 6.0 05/08/2018 1106   GLUCOSEU NEGATIVE 05/08/2018 1106   [place] (A) 05/08/2018 1106   [place] 05/08/2018  [place] 05/08/2018 1106   PROTEINUR 100 (A) 05/08/2018 1106   UROBILINOGEN 0.2 09/03/2013 0132   NITRITE NEGATIVE 05/08/2018 1106   LEUKOCYTESUR LARGE (A) 05/08/2018 1106   ---------------------------------------------------------------------------------------------------------------   Imaging Results:    Dg Chest 2 View  Result Date: 05/08/2018 CLINICAL DATA:  85 year old male with a history of nausea and shaking EXAM: CHEST - 2 VIEW COMPARISON:  01/03/2018 FINDINGS: Cardiomediastinal silhouette unchanged in size and contour. No evidence of central vascular congestion. No interlobular septal thickening. No pneumothorax or pleural effusion. Coarsened interstitial markings. No confluent airspace disease. No displaced fracture. IMPRESSION: Negative for acute cardiopulmonary disease, with background of chronic lung changes. Electronically Signed   By: Corrie Mckusick D.O.   On: 05/08/2018 12:16    Radiological Exams on Admission: Dg Chest 2 View  Result Date: 05/08/2018 CLINICAL DATA:  85 year old male with a history of nausea and shaking EXAM: CHEST - 2 VIEW COMPARISON:  01/03/2018 FINDINGS:  Cardiomediastinal silhouette unchanged in size and contour. No evidence of central vascular congestion. No interlobular septal thickening. No pneumothorax or pleural effusion. Coarsened interstitial markings. No confluent airspace disease. No displaced fracture. IMPRESSION: Negative for acute cardiopulmonary disease, with background of chronic lung changes. Electronically Signed   By: Corrie Mckusick D.O.   On: 05/08/2018 12:16    DVT Prophylaxis -SCD  /heparin AM Labs Ordered, also please review Full Orders  Family Communication: Admission, patients condition and plan of care including tests being ordered have been discussed with the patient  who indicate understanding and agree with the plan   Code Status - Full Code  Likely DC to  Home   Condition   stable  Roxan Hockey M.D on 05/08/2018 at 7:46 PM Go to [email or web address] -  for contact info  [facility] - Office  [phone]

## 2018-05-08 NOTE — ED Notes (Signed)
Pt going to xray  

## 2018-05-08 NOTE — ED Notes (Signed)
Catheter irrigated and urine was returned.

## 2018-05-08 NOTE — ED Triage Notes (Signed)
Pt returns from home left here 5 hours ago,  Called out for shaking, nausea,  CBG 221 at home. Pt has foley, states " the pain is better that was corrected in ED early this morning."

## 2018-05-08 NOTE — ED Provider Notes (Signed)
[place] Provider Note   CSN: [id number] Arrival date & time: 05/08/18  1056    History   Chief Complaint Chief Complaint  Patient presents with  . Fever    HPI Anthony Dunlap is a 85 y.o. male.     Patient complains of fever.  He had a Foley catheter that was not draining earlier today.  He was seen in the hospital and they fixed his Foley.  The history is provided by the patient. No language interpreter was used.  Fever  Max temp prior to arrival:  101 Temp source:  Oral Severity:  Moderate Onset quality:  Sudden Timing:  Constant Progression:  Worsening Chronicity:  New Relieved by:  Nothing Worsened by:  Nothing Associated symptoms: no chest pain, no congestion, no cough, no diarrhea, no headaches and no rash   Risk factors: no contaminated food     Past Medical History:  Diagnosis Date  . 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 Diabetes ([place])   . Left eye pain   . Prostate cancer [facility])     Patient Active Problem List   Diagnosis Date Noted  . 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 Abscess of left thigh   . Pressure injury of skin 02/10/2018  . Enterococcus faecalis infection 01/27/2018  . Abscess   . 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 Sepsis due to Bacteroides species ([place]) 01/22/2018  . Sepsis without acute organ dysfunction ([place])   . DKA (diabetic ketoacidoses) ([place]) 01/17/2018  . Normocytic anemia 01/17/2018  . Chronic anemia 01/03/2018  . Chronic indwelling Foley catheter 01/03/2018  . Elevated alkaline phosphatase level 01/03/2018  . Hypothyroidism 01/03/2018  . Physical deconditioning 01/03/2018  . 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 HLD (hyperlipidemia) 08/19/2013  . Type 2 diabetes mellitus ([place])   . Prostate cancer [facility])     Past Surgical History:  Procedure Laterality Date  . HERNIA REPAIR    . IR RADIOLOGIST EVAL & MGMT  03/06/2018   . PROSTATE SURGERY          Home Medications    Prior to Admission medications   Medication Sig Start Date End Date Taking? Authorizing Provider  acetaminophen (TYLENOL) 325 MG tablet Take 2 tablets (650 mg total) by mouth every 6 (six) hours as needed for mild pain, fever or headache. 02/18/18  Yes Darrick Meigs, Marge Duncans, MD  amoxicillin-clavulanate (AUGMENTIN) 875-125 MG tablet Take 1 tablet by mouth 2 (two) times daily. 05/02/18  Yes [provider]  aspirin EC 81 MG tablet Take 81 mg by mouth daily.   Yes [provider]  ferrous sulfate 325 (65 FE) MG tablet Take 1 tablet (325 mg total) by mouth daily with breakfast. 02/19/18  Yes Darrick Meigs, Marge Duncans, MD  glimepiride (AMARYL) 4 MG tablet Take 1 tablet by mouth daily. 04/18/18  Yes [provider]  insulin aspart (NOVOLOG) 100 UNIT/ML injection Inject 0-15 Units into the skin 3 (three) times daily with meals. Sliding scale insulin Less than 70 initiate hypoglycemia protocol 70-120  0 units 120-150 2 unit 151-200 3 units 201-250 3 units 251-300 5 units 301-350 8 units 351-400 11 units  Greater than 400 15 units , call MD 02/18/18  Yes Darrick Meigs, Marge Duncans, MD  levothyroxine (SYNTHROID, LEVOTHROID) 100 MCG tablet Take 1 tablet by mouth daily. 04/18/18  Yes [provider]  magnesium oxide (MAG-OX) 400 (241.3 Mg) MG tablet Take 1  tablet (400 mg total) by mouth daily. 02/19/18  Yes Oswald Hillock, MD  meloxicam (MOBIC) 7.5 MG tablet Take 1 tablet by mouth 2 (two) times daily. 04/18/18  Yes [provider]  Multiple Vitamin (MULTIVITAMIN WITH MINERALS) TABS tablet Take 1 tablet by mouth daily. 02/19/18  Yes Oswald Hillock, MD  nutrition supplement, JUVEN, (JUVEN) PACK Take 1 packet by mouth 2 (two) times daily between meals. 02/19/18  Yes Oswald Hillock, MD  nystatin (MYCOSTATIN/NYSTOP) powder Apply topically 4 (four) times daily. 05/08/18  Yes Mesner, Corene Cornea, MD  pravastatin (PRAVACHOL) 10 MG tablet Take 10 mg by mouth daily.    Yes [provider]  [place] 100-33 UNT-MCG/ML SOPN Inject 35-65 Units as directed daily. 04/28/18  Yes [provider]  ampicillin-sulbactam (UNASYN) IVPB Inject 3 g into the vein every 6 (six) hours. Indication:  abscesses and pelvic osteo  Last Day of Therapy:  03/25/2018 Labs - Once weekly:  CBC/D and BMP, Labs - Every other week:  ESR and CRP Patient not taking: Reported on 05/08/2018 02/18/18   Oswald Hillock, MD  LEVEMIR FLEXTOUCH 100 UNIT/ML Pen Inject 20 Units into the skin daily. Patient not taking: Reported on 05/08/2018 02/18/18   Oswald Hillock, MD    Family History Family History  Problem Relation Age of Onset  . Pneumonia Father   . Cancer Sister     Social History Social History   Tobacco Use  . Smoking status: Never Smoker  . Smokeless tobacco: Never Used  Substance Use Topics  . Alcohol use: No  . Drug use: No     Allergies   Patient has no known allergies.   Review of Systems Review of Systems  Constitutional: Positive for fever. Negative for appetite change and fatigue.  HENT: Negative for congestion, ear discharge and sinus pressure.   Eyes: Negative for discharge.  Respiratory: Negative for cough.   Cardiovascular: Negative for chest pain.  Gastrointestinal: Negative for abdominal pain and diarrhea.  Genitourinary: Negative for frequency and hematuria.  Musculoskeletal: Negative for back pain.  Skin: Negative for rash.  Neurological: Negative for seizures and headaches.  Psychiatric/Behavioral: Negative for hallucinations.     Physical Exam Updated Vital Signs BP (!) 132/96   Pulse (!) 120   Temp (!) 101.3 F (38.5 C) (Oral)   Resp 18   SpO2 95%   Physical Exam Vitals signs and nursing note reviewed.  Constitutional:      Appearance: He is well-developed.  HENT:     Head: Normocephalic.     Nose: Nose normal.     Mouth/Throat:     Mouth: Mucous membranes are moist.  Eyes:     General: No scleral icterus.     Conjunctiva/sclera: Conjunctivae normal.  Neck:     Musculoskeletal: Neck supple.     Thyroid: No thyromegaly.  Cardiovascular:     Rate and Rhythm: Normal rate and regular rhythm.     Heart sounds: No murmur. No friction rub. No gallop.   Pulmonary:     Breath sounds: No stridor. No wheezing or rales.  Chest:     Chest wall: No tenderness.  Abdominal:     General: There is no distension.     Tenderness: There is no abdominal tenderness. There is no rebound.  Musculoskeletal: Normal range of motion.  Lymphadenopathy:     Cervical: No cervical adenopathy.  Skin:    Findings: No erythema or rash.  Neurological:     Mental Status:  He is oriented to person, place, and time.     Motor: No abnormal muscle tone.     Coordination: Coordination normal.  Psychiatric:        Behavior: Behavior normal.      ED Treatments / Results  Labs (all labs ordered are listed, but only abnormal results are displayed) Labs Reviewed  LACTIC ACID, PLASMA - Abnormal; Notable for the following components:      Result Value   Lactic Acid, Venous 2.1 (*)    All other components within normal limits  CBC WITH DIFFERENTIAL/PLATELET - Abnormal; Notable for the following components:   WBC 12.2 (*)    RBC 3.67 (*)    Hemoglobin 9.4 (*)    HCT 30.1 (*)    MCH 25.6 (*)    Neutro Abs 11.7 (*)    Lymphs Abs 0.1 (*)    All other components within normal limits  URINALYSIS, ROUTINE W REFLEX MICROSCOPIC - Abnormal; Notable for the following components:   APPearance CLOUDY (*)    Hgb urine dipstick LARGE (*)    Protein, ur 100 (*)    Leukocytes,Ua LARGE (*)    RBC / HPF >50 (*)    WBC, UA >50 (*)    All other components within normal limits  COMPREHENSIVE METABOLIC PANEL - Abnormal; Notable for the following components:   Sodium 133 (*)    CO2 20 (*)    Glucose, Bld 177 (*)    Calcium 8.3 (*)    Albumin 2.9 (*)    AST 50 (*)    All other components within normal limits  CBC WITH  DIFFERENTIAL/PLATELET - Abnormal; Notable for the following components:   WBC 12.2 (*)    RBC 3.69 (*)    Hemoglobin 9.5 (*)    HCT 30.2 (*)    MCH 25.7 (*)    Neutro Abs 11.7 (*)    Lymphs Abs 0.1 (*)    All other components within normal limits  CULTURE, BLOOD (ROUTINE X 2)  CULTURE, BLOOD (ROUTINE X 2)  CULTURE, BLOOD (ROUTINE X 2)  CULTURE, BLOOD (ROUTINE X 2)  URINE CULTURE  C DIFFICILE QUICK SCREEN W PCR REFLEX  PROTIME-INR  COMPREHENSIVE METABOLIC PANEL  LACTIC ACID, PLASMA    EKG None  Radiology Dg Chest 2 View  Result Date: 05/08/2018 CLINICAL DATA:  85 year old male with a history of nausea and shaking EXAM: CHEST - 2 VIEW COMPARISON:  01/03/2018 FINDINGS: Cardiomediastinal silhouette unchanged in size and contour. No evidence of central vascular congestion. No interlobular septal thickening. No pneumothorax or pleural effusion. Coarsened interstitial markings. No confluent airspace disease. No displaced fracture. IMPRESSION: Negative for acute cardiopulmonary disease, with background of chronic lung changes. Electronically Signed   By: Corrie Mckusick D.O.   On: 05/08/2018 12:16    Procedures Procedures (including critical care time)  Medications Ordered in ED Medications  sodium chloride flush (NS) 0.9 % injection 3 mL (has no administration in time range)  sodium chloride 0.9 % bolus 2,000 mL (2,000 mLs Intravenous New Bag/Given 05/08/18 1207)  cefTRIAXone (ROCEPHIN) 1 g in sodium chloride 0.9 % 100 mL IVPB (0 g Intravenous Stopped 05/08/18 1235)     Initial Impression / Assessment and Plan / ED Course  I have reviewed the triage vital signs and the nursing notes.  Pertinent labs & imaging results that were available during my care of the patient were reviewed by me and considered in my medical decision making (see chart for details). Patient  with fever and urine suggest urinary tract infection he will be admitted to medicine.  He has been treated as a sepsis      Final Clinical Impressions(s) / ED Diagnoses   Final diagnoses:  Acute cystitis with hematuria    ED Discharge Orders    None       Milton Ferguson, MD 05/08/18 1310

## 2018-05-08 NOTE — ED Provider Notes (Signed)
Emergency Department Provider Note   I have reviewed the triage vital signs and the nursing notes.   HISTORY  Chief Complaint Urinary Retention   HPI Anthony Dunlap is a 85 y.o. male who has a history of urinary incontinence and has a catheter in place for over a year now just had it changed a couple weeks ago.  Presents the emergency department today with his catheter not working and increasing suprapubic pain.  Just started a few hours prior to arrival progressively worsening.  Has not tried any for the symptoms.  Has had blocked catheters in the past notes that this feels like.  No other symptoms. No other associated or modifying symptoms.    Past Medical History:  Diagnosis Date  . 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 ([place]) 04/21/2018  . Diabetes ([place])   . Left eye pain   . Prostate cancer [facility])     Patient Active Problem List   Diagnosis Date Noted  . 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 ([place]) 04/21/2018  . Abscess of left thigh   . Pressure injury of skin 02/10/2018  . Enterococcus faecalis infection 01/27/2018  . Abscess   . Psoas abscess ([place]) 01/23/2018  . Sepsis due to Bacteroides species ([place]) 01/22/2018  . Sepsis without acute organ dysfunction ([place])   . DKA (diabetic ketoacidoses) ([place]) 01/17/2018  . Normocytic anemia 01/17/2018  . Chronic anemia 01/03/2018  . Chronic indwelling Foley catheter 01/03/2018  . Elevated alkaline phosphatase level 01/03/2018  . Hypothyroidism 01/03/2018  . Physical deconditioning 01/03/2018  . 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 HLD (hyperlipidemia) 08/19/2013  . Type 2 diabetes mellitus ([place])   . Prostate cancer [facility])     Past Surgical History:  Procedure Laterality Date  . HERNIA REPAIR    . IR RADIOLOGIST EVAL & MGMT  03/06/2018  . PROSTATE SURGERY      Current Outpatient Rx  . Order #: [id number] Class: OTC  . Order #: [id number] Class: Print  . Order #: [id number] Class:  Historical Med  . Order #: [id number] Class: No Print  . Order #: [id number] Class: No Print  . Order #: [id number] Class: Normal  . Order #: [id number] Class: No Print  . Order #: [id number] Class: No Print  . Order #: [id number] Class: No Print  . Order #: [id number] Class: No Print  . Order #: [id number] Class: Normal  . Order #: [id number] Class: Historical Med    Allergies Patient has no known allergies.  Family History  Problem Relation Age of Onset  . Pneumonia Father   . Cancer Sister     Social History Social History   Tobacco Use  . Smoking status: Never Smoker  . Smokeless tobacco: Never Used  Substance Use Topics  . Alcohol use: No  . Drug use: No    Review of Systems  All other systems negative except as documented in the HPI. All pertinent positives and negatives as reviewed in the HPI. ____________________________________________   PHYSICAL EXAM:  VITAL SIGNS: ED Triage Vitals [05/08/18 0514]   Vitals:   05/08/18 0530 05/08/18 0600  BP: (!) 197/112 (!) 154/90  Pulse: (!) 125 (!) 102  Resp:  18  SpO2: 100% 96%    Constitutional: Alert and oriented. Well appearing and in no acute distress. Eyes: Conjunctivae are normal. PERRL. EOMI. Head: Atraumatic. Nose: No congestion/rhinnorhea. Mouth/Throat: Mucous membranes are moist.  Oropharynx non-erythematous. Neck: No stridor.  No meningeal signs.   Cardiovascular: Normal rate, regular rhythm. Good peripheral circulation. Grossly normal  heart sounds.   Respiratory: Normal respiratory effort.  No retractions. Lungs CTAB. Gastrointestinal: Soft and nontender. No distention.  Musculoskeletal: No lower extremity tenderness nor edema. No gross deformities of extremities. Neurologic:  Normal speech and language. No gross focal neurologic deficits are appreciated.  Skin:  Skin is warm, dry and intact. Erythematous, pruritic rash involving scrotum, penis and perineal area with satellite  lesions.   ____________________________________________   LABS (all labs ordered are listed, but only abnormal results are displayed)  Labs Reviewed  URINE CULTURE  URINALYSIS, ROUTINE W REFLEX MICROSCOPIC   ____________________________________________   INITIAL IMPRESSION / ASSESSMENT AND PLAN / ED COURSE  Likely yeast infection.  Catheter repositioned and reinflated with full drainage.  We will send a culture and urinalysis but not septic so we will treat for infection at this time pending culture results. Improved VS with drainage, likely reactive to situation/pain.  Pertinent labs & imaging results that were available during my care of the patient were reviewed by me and considered in my medical decision making (see chart for details).  ____________________________________________  FINAL CLINICAL IMPRESSION(S) / ED DIAGNOSES  Final diagnoses:  Urinary retention  Problem with Foley catheter, initial encounter ([place])  Candidal intertrigo     MEDICATIONS GIVEN DURING THIS VISIT:  Medications - No data to display   NEW OUTPATIENT MEDICATIONS STARTED DURING THIS VISIT:  New Prescriptions   NYSTATIN (MYCOSTATIN/NYSTOP) POWDER    Apply topically 4 (four) times daily.    Note:  This note was prepared with assistance of Dragon voice recognition software. Occasional wrong-word or sound-a-like substitutions may have occurred due to the inherent limitations of voice recognition software.   Heberto Sturdevant, Corene Cornea, MD 05/08/18 [phone]

## 2018-05-08 NOTE — ED Triage Notes (Signed)
Pt C/o urinary retention. Pt has foley catheter in place. Catheter was placed 2 weeks ago. Per pt foley stopped draining around 2200 last night.

## 2018-05-08 NOTE — ED Triage Notes (Signed)
Pt was given 1 liter bolus and 1gm of tylenol in route.

## 2018-05-08 NOTE — ED Notes (Signed)
Pt and bed cleaned and changed

## 2018-05-08 NOTE — Progress Notes (Signed)
CRITICAL VALUE ALERT  Critical Value:  Lactic Acid 2.3  Date & Time Notied:  05/08/18 1730  Provider Notified: 05/08/18 1735  Orders Received/Actions taken: New orders for NS bolus received.  MD Emokpae made aware that patient's HR 128-130 sinus rhythm sustained. Will continue to monitor.

## 2018-05-08 NOTE — ED Notes (Signed)
Lab at the bedside 

## 2018-05-09 LAB — BASIC METABOLIC PANEL
Anion gap: 11 (ref 5–15)
BUN: 16 mg/dL (ref 8–23)
CO2: 19 mmol/L — ABNORMAL LOW (ref 22–32)
Calcium: 7.8 mg/dL — ABNORMAL LOW (ref 8.9–10.3)
Chloride: 103 mmol/L (ref 98–111)
Creatinine, Ser: 0.91 mg/dL (ref 0.61–1.24)
GFR calc Af Amer: >60 mL/min (ref >60)
GFR calc non Af Amer: >60 mL/min (ref >60)
Glucose, Bld: 101 mg/dL — ABNORMAL HIGH (ref 70–99)
Potassium: 2.9 mmol/L — ABNORMAL LOW (ref 3.5–5.1)
Sodium: 133 mmol/L — ABNORMAL LOW (ref 135–145)

## 2018-05-09 LAB — URINE CULTURE
Culture: NO GROWTH
Culture: NO GROWTH
Special Requests: NORMAL

## 2018-05-09 LAB — CBC
HCT: 32.1 % — ABNORMAL LOW (ref 39.0–52.0)
Hemoglobin: 10 g/dL — ABNORMAL LOW (ref 13.0–17.0)
MCH: 25.6 pg — ABNORMAL LOW (ref 26.0–34.0)
MCHC: 31.2 g/dL (ref 30.0–36.0)
MCV: 82.1 fL (ref 80.0–100.0)
NRBC: 0 % (ref 0.0–0.2)
Platelets: 307 10*3/uL (ref 150–400)
RBC: 3.91 MIL/uL — ABNORMAL LOW (ref 4.22–5.81)
RDW: 14.7 % (ref 11.5–15.5)
WBC: 17.4 10*3/uL — ABNORMAL HIGH (ref 4.0–10.5)

## 2018-05-09 LAB — MRSA PCR SCREENING: MRSA by PCR: NEGATIVE

## 2018-05-09 LAB — GLUCOSE, CAPILLARY
Glucose-Capillary: 87 mg/dL (ref 70–99)
Glucose-Capillary: 275 mg/dL — ABNORMAL HIGH (ref 70–99)
Glucose-Capillary: 248 mg/dL — ABNORMAL HIGH (ref 70–99)
Glucose-Capillary: 74 mg/dL (ref 70–99)

## 2018-05-09 MED ORDER — POTASSIUM CHLORIDE CRYS ER 10 MEQ PO TBCR
40.0000 meq | EXTENDED_RELEASE_TABLET | Freq: Once | ORAL | Status: AC
  Administered 2018-05-09: 40 meq via ORAL
  Filled 2018-05-09: qty 4

## 2018-05-09 MED ORDER — SODIUM CHLORIDE 0.9 % IV SOLN
INTRAVENOUS | Status: DC
  Administered 2018-05-09: 10:00:00 via INTRAVENOUS

## 2018-05-09 MED ORDER — POTASSIUM CHLORIDE IN NACL 20-0.9 MEQ/L-% IV SOLN
INTRAVENOUS | Status: DC
  Administered 2018-05-09 – 2018-05-14 (×4): via INTRAVENOUS

## 2018-05-09 NOTE — Progress Notes (Addendum)
Patient Demographics:    Anthony Dunlap, is a 85 y.o. male, DOB - 1934-02-13, OJJ:[id number]  Admit date - 05/08/2018   Admitting Physician Denya Buckingham Denton Brick, MD  Outpatient Primary MD for the patient is [name], MD  LOS - 1   Chief Complaint  Patient presents with  . Fever        Subjective:    Anthony Dunlap today has no fevers, no emesis,  No chest pain,  No fever  Or chills,  No Nausea, Vomiting or Diarrhea   Assessment  & Plan :    Principal Problem:   Complicated UTI (urinary tract infection) Active Problems:   Type 2 diabetes mellitus ([name])   Chronic anemia   Chronic indwelling Foley catheter   Hypothyroidism   UTI (urinary tract infection)  Brief Summary:- 85 y.o. male with PMHx of DM2, HLD and Hypothyroidism as well as chronic Anemia and chronic urinary Retention with chronic indwelling Foley catheter in the setting of prior history of prostate cancer admitted on 05/08/2018 with presumed UTI after presenting to the ED twice within a 24-hour period with urinary and Foley catheter complaints  Plan:- 1)SIRS secondary to Presumed complicated UTI---POA-- leukocytosis persist, tachycardia and fever resolving, , overall hemodynamically stable at this time, patient with chronically indwelling Foley catheter last changed 2 weeks PTA, Foley changed again on 05/08/2018, continue with IV Rocephin pending blood and urine cultures, repeat lactic acid after fluid bolus is down to 1.9, follow CBC and urine and blood cultures  2)DM2--no frank DKA, as A1c was 9.6 in November 2019, reflecting poor diabetic control.  continue Amaryl 4 mg with breakfast, c/n Levemir 18 units daily, use Novolog/Humalog Sliding scale insulin with Accu-Cheks/Fingersticks as ordered  3)candidal intertrigo with possible Candida UTI, IV Diflucan as ordered--use topical nystatin cream to groin/scrotum/perineum  4)  Unspecified/unknown etiology chronic anemia--- ??? Etiology, stable with hemoglobin around 10 which is close to patient's baseline,    continue iron supplementation  5)Hypothyroidism--- patient's TSH was 6.3 on February 11, 2018, continue levothyroxine 100 mcg dialy  6)HLD--stable, continue pravastatin 10 mg daily with aspirin 81 mg daily   On admission patient had a temperature above 101, heart rate above 130, WBC above 12, in the setting of catheter associated UTI, please note that patient has failed outpatient Ciprofloxacillin recently, also failed outpatient Augmentin patient is currently on Augmentin day # 4  Pt meets SIRS Criteria--- has failed 2 oral antibiotics he meets inpatient criteria  Disposition/Need for in-Hospital Stay- patient unable to be discharged at this time due to leukocytosis persist, awaiting urine and blood culture results to help guide antibiotic therapy, patient previously failed oral antibiotics x2 as outpatient PTA  Code Status : full  Family Communication:   na  Disposition Plan  : home  Consults  :  na  DVT Prophylaxis  :    - Heparin -  Lab Results  Component Value Date   PLT 307 05/09/2018    Inpatient Medications  Scheduled Meds: . aspirin EC  81 mg Oral Daily  . ferrous sulfate  325 mg Oral Q breakfast  . glimepiride  4 mg Oral Daily  . heparin  5,000 Units Subcutaneous Q8H  . insulin aspart  0-5 Units Subcutaneous QHS  .  insulin aspart  0-9 Units Subcutaneous TID WC  . insulin detemir  18 Units Subcutaneous Daily  . levothyroxine  100 mcg Oral Daily  . magnesium oxide  400 mg Oral Daily  . multivitamin with minerals  1 tablet Oral Daily  . nutrition supplement (JUVEN)  1 packet Oral BID BM  . nystatin   Topical BID  . pravastatin  10 mg Oral q1800  . sodium chloride flush  3 mL Intravenous Once  . sodium chloride flush  3 mL Intravenous Q12H   Continuous Infusions: . sodium chloride    . sodium chloride 100 mL/hr at 05/09/18  0948  . cefTRIAXone (ROCEPHIN)  IV    . fluconazole (DIFLUCAN) IV 200 mg (05/08/18 1914)   PRN Meds:.sodium chloride, acetaminophen **OR** acetaminophen, albuterol, ondansetron **OR** ondansetron (ZOFRAN) IV, polyethylene glycol, sodium chloride flush, traZODone    Anti-infectives (From admission, onward)   Start     Dose/Rate Route Frequency Ordered Stop   05/09/18 1200  cefTRIAXone (ROCEPHIN) 1 g in sodium chloride 0.9 % 100 mL IVPB     1 g 200 mL/hr over 30 Minutes Intravenous Every 24 hours 05/08/18 1643     05/08/18 1800  fluconazole (DIFLUCAN) IVPB 200 mg     200 mg 100 mL/hr over 60 Minutes Intravenous Every 24 hours 05/08/18 1750     05/08/18 1400  fluconazole (DIFLUCAN) IVPB 200 mg  Status:  Discontinued     200 mg 100 mL/hr over 60 Minutes Intravenous Every 24 hours 05/08/18 1356 05/08/18 1750   05/08/18 1145  cefTRIAXone (ROCEPHIN) 1 g in sodium chloride 0.9 % 100 mL IVPB     1 g 200 mL/hr over 30 Minutes Intravenous  Once 05/08/18 1136 05/08/18 1235        Objective:   Vitals:   05/08/18 2052 05/08/18 2110 05/09/18 0555 05/09/18 0555  BP:  (!) 159/91 130/76   Pulse:  (!) 125 (!) 116 (!) 117  Resp:  16 16   Temp:  99.8 F (37.7 C) 99.4 F (37.4 C)   TempSrc:  Oral Oral   SpO2: 99% 94% 96% 95%  Weight:      Height:        Wt Readings from Last 3 Encounters:  05/08/18 68.7 kg  05/08/18 67.5 kg  04/21/18 67.6 kg     Intake/Output Summary (Last 24 hours) at 05/09/2018 1105 Last data filed at 05/09/2018 1610 Gross per 24 hour  Intake 2100 ml  Output 5320 ml  Net -3220 ml   Physical Exam Patient is examined daily including today on 05/09/18 , exams remain the same as of yesterday except that has changed   Gen:- Awake Alert,  In no apparent distress  HEENT:- [place].AT, No sclera icterus Neck-Supple Neck,No JVD,.  Lungs-  CTAB , fair symmetrical air movement CV- S1, S2 normal, regular  Abd-  +ve B.Sounds, Abd Soft, No tenderness,    Extremity:- No  edema,  pedal pulses present  Psych-affect is appropriate, oriented x3 Neuro-no new focal deficits, no tremors Skin -groin/scrotum/perineum with erythematous rash with satellite lesions consistent with candidal intertrigo GU--- Foley catheter with clear urine    Data Review:   Micro Results Recent Results (from the past 240 hour(s))  Urine culture     Status: None   Collection Time: 05/08/18  5:12 AM  Result Value Ref Range Status   Specimen Description   Final    URINE, CLEAN CATCH Performed at [facility], [address], [place],  [place] [address]    Special Requests   Final    NONE Performed at [facility], [address] [place], [place] [address]    Culture   Final    NO GROWTH Performed at [facility], [place] [address], [place], [place] [address]    Report Status 05/09/2018 FINAL  Final  Culture, blood (Routine x 2)     Status: None (Preliminary result)   Collection Time: 05/08/18 11:52 AM  Result Value Ref Range Status   Specimen Description BLOOD  Final   Special Requests NONE  Final   Culture   Final    NO GROWTH < 24 HOURS Performed at [facility], [address] [place], [place] [address]    Report Status PENDING  Incomplete  Culture, blood (Routine x 2)     Status: None (Preliminary result)   Collection Time: 05/08/18 11:52 AM  Result Value Ref Range Status   Specimen Description BLOOD RIGHT ARM  Final   Special Requests   Final    BOTTLES DRAWN AEROBIC AND ANAEROBIC Blood Culture adequate volume   Culture   Final    NO GROWTH < 24 HOURS Performed at [facility], [address] [place], [place] [address]    Report Status PENDING  Incomplete  Blood Culture (routine x 2)     Status: None (Preliminary result)   Collection Time: 05/08/18 11:52 AM  Result Value Ref Range Status   Specimen Description BLOOD LEFT HAND  Final   Special Requests   Final    BOTTLES DRAWN AEROBIC AND ANAEROBIC Blood Culture adequate volume   Culture   Final    NO GROWTH < 24  HOURS Performed at [facility], [address] [place], [place] [address]    Report Status PENDING  Incomplete  Blood Culture (routine x 2)     Status: None (Preliminary result)   Collection Time: 05/08/18 11:52 AM  Result Value Ref Range Status   Specimen Description BLOOD  Final   Special Requests NONE  Final   Culture   Final    NO GROWTH < 24 HOURS Performed at [facility], [address], [place], [place] [address]    Report Status PENDING  Incomplete    Radiology Reports Dg Chest 2 View  Result Date: 05/08/2018 CLINICAL DATA:  85 year old male with a history of nausea and shaking EXAM: CHEST - 2 VIEW COMPARISON:  01/03/2018 FINDINGS: Cardiomediastinal silhouette unchanged in size and contour. No evidence of central vascular congestion. No interlobular septal thickening. No pneumothorax or pleural effusion. Coarsened interstitial markings. No confluent airspace disease. No displaced fracture. IMPRESSION: Negative for acute cardiopulmonary disease, with background of chronic lung changes. Electronically Signed   By: Corrie Mckusick D.O.   On: 05/08/2018 12:16     CBC Recent Labs  Lab 05/08/18 1151 05/08/18 1152 05/09/18 0549  WBC 12.2* 12.2* 17.4*  HGB 9.4* 9.5* 10.0*  HCT 30.1* 30.2* 32.1*  PLT 354 357 307  MCV 82.0 81.8 82.1  MCH 25.6* 25.7* 25.6*  MCHC 31.2 31.5 31.2  RDW 14.4 14.3 14.7  LYMPHSABS 0.1* 0.1*  --   MONOABS 0.3 0.3  --   EOSABS 0.0 0.0  --   BASOSABS 0.0 0.0  --     Chemistries  Recent Labs  Lab 05/08/18 1151 05/08/18 1152 05/09/18 0549  NA 134* 133* 133*  K 3.7 3.6 2.9*  CL 105 101 103  CO2 20* 20* 19*  GLUCOSE 175* 177* 101*  BUN 20 20 16   CREATININE 0.85 0.81 0.91  CALCIUM 8.3* 8.3* 7.8*  AST 51* 50*  --   ALT 29 30  --   ALKPHOS 122 121  --   BILITOT 0.4 0.6  --    ------------------------------------------------------------------------------------------------------------------ No results for input(s): CHOL, HDL, LDLCALC, TRIG,  CHOLHDL, LDLDIRECT in the last 72 hours.  Lab Results  Component Value Date   HGBA1C 9.6 (H) 01/04/2018   ------------------------------------------------------------------------------------------------------------------ No results for input(s): TSH, T4TOTAL, T3FREE, THYROIDAB in the last 72 hours.  Invalid input(s): FREET3 ------------------------------------------------------------------------------------------------------------------ No results for input(s): VITAMINB12, FOLATE, FERRITIN, TIBC, IRON, RETICCTPCT in the last 72 hours.  Coagulation profile Recent Labs  Lab 05/08/18 1151  INR 1.1    No results for input(s): DDIMER in the last 72 hours.  Cardiac Enzymes No results for input(s): CKMB, TROPONINI, MYOGLOBIN in the last 168 hours.  Invalid input(s): CK ------------------------------------------------------------------------------------------------------------------ No results found for: BNP   Roxan Hockey M.D on 05/09/2018 at 11:05 AM  Go to [email or web address] - for contact info  [facility] - Office  [phone]

## 2018-05-09 NOTE — Care Management Important Message (Signed)
Important Message  Patient Details  Name: STORY VANVRANKEN MRN: [id number] Date of Birth: 1933-04-15   Medicare Important Message Given:  Yes    Tommy Medal 05/09/2018, 12:34 PM

## 2018-05-09 NOTE — TOC Initial Note (Signed)
Transition of Care [facility]) - Initial/Assessment Note    Patient Details  Name: Anthony Dunlap MRN: [id number] Date of Birth: Mar 29, 1933  Transition of Care [facility]) CM/SW Contact:    Sherald Barge, RN Phone Number: 05/09/2018, 10:01 AM  Clinical Narrative:     Pt communicates no needs or concerns about DC planning.          Expected Discharge Plan: Home/Self Care    Expected Discharge Plan and Services Expected Discharge Plan: Home/Self Care       Living arrangements for the past 2 months: Single Family Home                   Prior Living Arrangements/Services Living arrangements for the past 2 months: Single Family Home Lives with:: Other (Comment), Spouse(pt says he lives with his wife and "other people") Patient language and need for interpreter reviewed:: Yes Do you feel safe going back to the place where you live?: Yes      Need for Family Participation in Patient Care: No (Comment) Care giver support system in place?: Yes (comment)   Criminal Activity/Legal Involvement Pertinent to Current Situation/Hospitalization: No - Comment as needed  Activities of Daily Living Home Assistive Devices/Equipment: CBG Meter, Eyeglasses, Walker (specify type) ADL Screening (condition at time of admission) Patient's cognitive ability adequate to safely complete daily activities?: Yes Is the patient deaf or have difficulty hearing?: No Does the patient have difficulty seeing, even when wearing glasses/contacts?: No Does the patient have difficulty concentrating, remembering, or making decisions?: No Patient able to express need for assistance with ADLs?: Yes Does the patient have difficulty dressing or bathing?: No Independently performs ADLs?: No Communication: Independent Dressing (OT): Independent Grooming: Independent with device (comment) Feeding: Independent Bathing: Independent with device (comment) Toileting: Independent with device (comment) In/Out Bed:  Independent with device (comment) Walks in Home: Independent with device (comment) Does the patient have difficulty walking or climbing stairs?: Yes Weakness of Legs: Both Weakness of Arms/Hands: Both  Emotional Assessment Appearance:: Appears younger than stated age Attitude/Demeanor/Rapport: Guarded Affect (typically observed): Flat Orientation: : Oriented to Self, Oriented to Place, Oriented to  Time, Oriented to Situation      Admission diagnosis:  Acute cystitis with hematuria [N30.01] Patient Active Problem List   Diagnosis Date Noted  . UTI (urinary tract infection) 05/08/2018  . Complicated UTI (urinary tract infection) 05/08/2018  . 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 ([place]) 04/21/2018  . Abscess of left thigh   . Pressure injury of skin 02/10/2018  . Enterococcus faecalis infection 01/27/2018  . Abscess   . 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 Sepsis due to Bacteroides species ([place]) 01/22/2018  . Sepsis without acute organ dysfunction ([place])   . DKA (diabetic ketoacidoses) ([place]) 01/17/2018  . Normocytic anemia 01/17/2018  . Chronic anemia 01/03/2018  . Chronic indwelling Foley catheter 01/03/2018  . Elevated alkaline phosphatase level 01/03/2018  . Hypothyroidism 01/03/2018  . Physical deconditioning 01/03/2018  . 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 HLD (hyperlipidemia) 08/19/2013  . Type 2 diabetes mellitus ([place])   . Prostate cancer [facility])    PCP:  [name], MD Pharmacy:   [place], [address] AT [address] [place] [address] Phone: [phone] Fax: [phone]    Readmission Risk Interventions Readmission Risk Prevention Plan 05/09/2018  Transportation Screening Complete  PCP or Specialist Appt within  3-5 Days Complete  HRI or Home Care Consult Complete  Social Work Consult for [place] Planning/Counseling Complete  Palliative  Care Screening Not Applicable  Medication Review Press photographer) Complete  Some recent data might be hidden

## 2018-05-10 DIAGNOSIS — B49 Unspecified mycosis: Secondary | ICD-10-CM | POA: Diagnosis present

## 2018-05-10 LAB — BLOOD CULTURE ID PANEL (REFLEXED)
Acinetobacter baumannii: NOT DETECTED
CANDIDA TROPICALIS: NOT DETECTED
Candida albicans: DETECTED — AB
Candida glabrata: NOT DETECTED
Candida krusei: NOT DETECTED
Candida parapsilosis: NOT DETECTED
Enterobacter cloacae complex: NOT DETECTED
Enterobacteriaceae species: NOT DETECTED
Enterococcus species: NOT DETECTED
Escherichia coli: NOT DETECTED
Haemophilus influenzae: NOT DETECTED
Klebsiella oxytoca: NOT DETECTED
Klebsiella pneumoniae: NOT DETECTED
Listeria monocytogenes: NOT DETECTED
Neisseria meningitidis: NOT DETECTED
Proteus species: NOT DETECTED
Pseudomonas aeruginosa: NOT DETECTED
STAPHYLOCOCCUS SPECIES: NOT DETECTED
Serratia marcescens: NOT DETECTED
Staphylococcus aureus (BCID): NOT DETECTED
Streptococcus agalactiae: NOT DETECTED
Streptococcus pneumoniae: NOT DETECTED
Streptococcus pyogenes: NOT DETECTED
Streptococcus species: NOT DETECTED

## 2018-05-10 LAB — CBC
HCT: 30.9 % — ABNORMAL LOW (ref 39.0–52.0)
HEMOGLOBIN: 9.6 g/dL — AB (ref 13.0–17.0)
MCH: 25.4 pg — ABNORMAL LOW (ref 26.0–34.0)
MCHC: 31.1 g/dL (ref 30.0–36.0)
MCV: 81.7 fL (ref 80.0–100.0)
NRBC: 0 % (ref 0.0–0.2)
Platelets: 215 10*3/uL (ref 150–400)
RBC: 3.78 MIL/uL — ABNORMAL LOW (ref 4.22–5.81)
RDW: 14.7 % (ref 11.5–15.5)
WBC: 9.7 10*3/uL (ref 4.0–10.5)

## 2018-05-10 LAB — GLUCOSE, CAPILLARY
Glucose-Capillary: 166 mg/dL — ABNORMAL HIGH (ref 70–99)
Glucose-Capillary: 189 mg/dL — ABNORMAL HIGH (ref 70–99)
Glucose-Capillary: 201 mg/dL — ABNORMAL HIGH (ref 70–99)
Glucose-Capillary: 40 mg/dL — CL (ref 70–99)
Glucose-Capillary: 43 mg/dL — CL (ref 70–99)
Glucose-Capillary: 51 mg/dL — ABNORMAL LOW (ref 70–99)
Glucose-Capillary: 55 mg/dL — ABNORMAL LOW (ref 70–99)
Glucose-Capillary: 90 mg/dL (ref 70–99)

## 2018-05-10 LAB — BASIC METABOLIC PANEL
Anion gap: 9 (ref 5–15)
BUN: 26 mg/dL — ABNORMAL HIGH (ref 8–23)
CHLORIDE: 103 mmol/L (ref 98–111)
CO2: 20 mmol/L — ABNORMAL LOW (ref 22–32)
Calcium: 8 mg/dL — ABNORMAL LOW (ref 8.9–10.3)
Creatinine, Ser: 0.97 mg/dL (ref 0.61–1.24)
GFR calc Af Amer: >60 mL/min (ref >60)
Glucose, Bld: 66 mg/dL — ABNORMAL LOW (ref 70–99)
Potassium: 3.6 mmol/L (ref 3.5–5.1)
Sodium: 132 mmol/L — ABNORMAL LOW (ref 135–145)

## 2018-05-10 LAB — MAGNESIUM: Magnesium: 1.6 mg/dL — ABNORMAL LOW (ref 1.7–2.4)

## 2018-05-10 MED ORDER — SODIUM CHLORIDE 0.9 % IV SOLN: 100.0000 mg | INTRAVENOUS | Status: DC

## 2018-05-10 MED ORDER — INSULIN DETEMIR 100 UNIT/ML ~~LOC~~ SOLN
6.0000 [IU] | Freq: Every day | SUBCUTANEOUS | Status: DC
  Administered 2018-05-11 – 2018-05-14 (×4): 6 [IU] via SUBCUTANEOUS
  Filled 2018-05-10 (×5): qty 0.06

## 2018-05-10 MED ORDER — GLUCOSE 40 % PO GEL
ORAL | Status: AC
  Administered 2018-05-10: 2
  Filled 2018-05-10: qty 2

## 2018-05-10 MED ORDER — FLUCONAZOLE IN SODIUM CHLORIDE 400-0.9 MG/200ML-% IV SOLN
800.0000 mg | Freq: Once | INTRAVENOUS | Status: DC
  Filled 2018-05-10: qty 400

## 2018-05-10 MED ORDER — DEXTROSE 10 % IV SOLN
INTRAVENOUS | Status: AC
  Administered 2018-05-10: 03:00:00 via INTRAVENOUS

## 2018-05-10 MED ORDER — FLUCONAZOLE IN SODIUM CHLORIDE 400-0.9 MG/200ML-% IV SOLN
400.0000 mg | Freq: Once | INTRAVENOUS | Status: AC
  Administered 2018-05-10: 400 mg via INTRAVENOUS
  Filled 2018-05-10: qty 200

## 2018-05-10 MED ORDER — FLUCONAZOLE IN SODIUM CHLORIDE 400-0.9 MG/200ML-% IV SOLN: 400.0000 mg | Freq: Once | INTRAVENOUS | Status: DC

## 2018-05-10 MED ORDER — SODIUM CHLORIDE 0.9 % IV SOLN: 200.0000 mg | Freq: Once | INTRAVENOUS | Status: DC

## 2018-05-10 MED ORDER — DEXTROSE 50 % IV SOLN
INTRAVENOUS | Status: AC
  Administered 2018-05-10: 50 mL
  Filled 2018-05-10: qty 50

## 2018-05-10 MED ORDER — FLUCONAZOLE IN SODIUM CHLORIDE 400-0.9 MG/200ML-% IV SOLN
400.0000 mg | INTRAVENOUS | Status: DC
  Administered 2018-05-11: 400 mg via INTRAVENOUS
  Filled 2018-05-10 (×4): qty 200

## 2018-05-10 NOTE — Progress Notes (Signed)
Patient Demographics:    Anthony Dunlap, is a 85 y.o. male, DOB - 10-15-1933, TMH:[id number]  Admit date - 05/08/2018   Admitting Physician Mylissa Lambe Denton Brick, MD  Outpatient Primary MD for the patient is [name], MD  LOS - 2   Chief Complaint  Patient presents with  . Fever        Subjective:    [name] today has no fevers, no emesis,  No chest pain,  No fever  Or chills,  No Nausea, Vomiting or Diarrhea... Denies visual problems, patient had episodes of hypoglycemia overnight   Assessment  & Plan :    Principal Problem:   Fungemia/Candida albicans Active Problems:   Type 2 diabetes mellitus ([name])   Chronic anemia   Chronic indwelling Foley catheter   Hypothyroidism   UTI (urinary tract infection)   Complicated UTI (urinary tract infection)  Brief Summary:- 85 y.o. male with PMHx of DM2, HLD and Hypothyroidism as well as chronic Anemia and chronic urinary Retention with chronic indwelling Foley catheter in the setting of prior history of prostate cancer admitted on 05/08/2018 with presumed UTI after presenting to the ED twice within a 24-hour period with urinary and Foley catheter complaints, now found to have fungemia with Candida albicans  Plan:- 1)Fungemia due to Candida albicans--- blood cultures from Jul 08, 2018 with Candida albicans, discussed with infectious disease specialist [name]... Treat with high-dose IV fluconazole per protocol, repeat blood cultures 05/10/2018... Patient will need 2 weeks of fluconazole (IV and orally) combined for about 2 weeks from first negative culture... Patient denies any visual concerns at this time patient will need ophthalmology evaluation as outpatient given fungemia  2)SIRS secondary to Presumed complicated UTI---POA-- leukocytosis persist, tachycardia and fever resolving, , overall hemodynamically stable at this time, patient with chronically  indwelling Foley catheter last changed 2 weeks PTA, Foley changed again on 05/08/2018, continue with IV Rocephin pending Final blood and urine cultures,   3)candidal intertrigo with possible Candida UTI, IV Diflucan as ordered--use topical nystatin cream to groin/scrotum/perineum--- patient now has Candida albicans in his blood, see #1 above  4) Unspecified/unknown etiology chronic anemia--- ??? Etiology, stable with hemoglobin around 10 which is close to patient's baseline,    continue iron supplementation  5)Hypothyroidism--- patient's TSH was 6.3 on February 11, 2018, continue levothyroxine 100 mcg dialy  6)HLD--stable, continue pravastatin 10 mg daily with aspirin 81 mg daily  7)DM2--no frank DKA, as A1c was 9.6 in November 2019, reflecting poor diabetic control.  Patient had episodes of hypoglycemia overnight requiring IV dextrose administration, with decrease Levemir insulin to 6 units from 18 units, may discontinue IV dextrose, continue Amaryl 4 mg with breakfast,use Novolog/Humalog Sliding scale insulin with Accu-Cheks/Fingersticks as ordered   Disposition/Need for in-Hospital Stay- patient unable to be discharged at this time due to fungemia requiring high-dose IV fluconazole, also awaiting further awaiting urine and blood culture results to help guide antibiotic therapy, patient previously failed oral antibiotics x2 as outpatient PTA  Code Status : full  Family Communication:   na  Disposition Plan  : home  Consults  :  na  DVT Prophylaxis  :    - Heparin -  Lab Results  Component Value Date   PLT 215 05/10/2018  Inpatient Medications  Scheduled Meds: . aspirin EC  81 mg Oral Daily  . ferrous sulfate  325 mg Oral Q breakfast  . glimepiride  4 mg Oral Daily  . heparin  5,000 Units Subcutaneous Q8H  . insulin aspart  0-5 Units Subcutaneous QHS  . insulin aspart  0-9 Units Subcutaneous TID WC  . [START ON 05/11/2018] insulin detemir  6 Units Subcutaneous Daily  .  levothyroxine  100 mcg Oral Daily  . magnesium oxide  400 mg Oral Daily  . multivitamin with minerals  1 tablet Oral Daily  . nutrition supplement (JUVEN)  1 packet Oral BID BM  . nystatin   Topical BID  . pravastatin  10 mg Oral q1800  . sodium chloride flush  3 mL Intravenous Once  . sodium chloride flush  3 mL Intravenous Q12H   Continuous Infusions: . sodium chloride    . 0.9 % NaCl with KCl 20 mEq / L 50 mL/hr at 05/09/18 1219  . dextrose 50 mL/hr at 05/10/18 0305  . [START ON 05/11/2018] fluconazole (DIFLUCAN) IV    . fluconazole (DIFLUCAN) IV     PRN Meds:.sodium chloride, acetaminophen **OR** acetaminophen, albuterol, ondansetron **OR** ondansetron (ZOFRAN) IV, polyethylene glycol, sodium chloride flush, traZODone    Anti-infectives (From admission, onward)   Start     Dose/Rate Route Frequency Ordered Stop   05/11/18 1000  fluconazole (DIFLUCAN) IVPB 400 mg     400 mg 100 mL/hr over 120 Minutes Intravenous Every 24 hours 05/10/18 0925     05/11/18 0000  anidulafungin (ERAXIS) 100 mg in sodium chloride 0.9 % 100 mL IVPB  Status:  Discontinued     100 mg 78 mL/hr over 100 Minutes Intravenous Every 24 hours 05/10/18 0911 05/10/18 0924   05/10/18 1230  fluconazole (DIFLUCAN) IVPB 400 mg     400 mg 100 mL/hr over 120 Minutes Intravenous  Once 05/10/18 0945     05/10/18 1130  fluconazole (DIFLUCAN) IVPB 400 mg  Status:  Discontinued     400 mg 100 mL/hr over 120 Minutes Intravenous  Once 05/10/18 0940 05/10/18 0943   05/10/18 1030  fluconazole (DIFLUCAN) IVPB 800 mg  Status:  Discontinued     800 mg 200 mL/hr over 120 Minutes Intravenous  Once 05/10/18 0925 05/10/18 0937   05/10/18 1030  fluconazole (DIFLUCAN) IVPB 400 mg  Status:  Discontinued     400 mg 100 mL/hr over 120 Minutes Intravenous  Once 05/10/18 0940 05/10/18 0943   05/10/18 1030  fluconazole (DIFLUCAN) IVPB 400 mg     400 mg 100 mL/hr over 120 Minutes Intravenous  Once 05/10/18 0945 05/10/18 1644   05/10/18  0915  anidulafungin (ERAXIS) 200 mg in sodium chloride 0.9 % 200 mL IVPB  Status:  Discontinued     200 mg 78 mL/hr over 200 Minutes Intravenous  Once 05/10/18 0911 05/10/18 0925   05/09/18 1200  cefTRIAXone (ROCEPHIN) 1 g in sodium chloride 0.9 % 100 mL IVPB  Status:  Discontinued     1 g 200 mL/hr over 30 Minutes Intravenous Every 24 hours 05/08/18 1643 05/10/18 1730   05/08/18 1800  fluconazole (DIFLUCAN) IVPB 200 mg  Status:  Discontinued     200 mg 100 mL/hr over 60 Minutes Intravenous Every 24 hours 05/08/18 1750 05/10/18 0911   05/08/18 1400  fluconazole (DIFLUCAN) IVPB 200 mg  Status:  Discontinued     200 mg 100 mL/hr over 60 Minutes Intravenous Every 24 hours 05/08/18 1356 05/08/18 1750  05/08/18 1145  cefTRIAXone (ROCEPHIN) 1 g in sodium chloride 0.9 % 100 mL IVPB     1 g 200 mL/hr over 30 Minutes Intravenous  Once 05/08/18 1136 05/08/18 1235        Objective:   Vitals:   05/09/18 1419 05/09/18 2041 05/09/18 2118 05/10/18 0440  BP: 111/69  114/64 134/79  Pulse: (!) 110  (!) 110 (!) 107  Resp: 20  16 15   Temp: 98.5 F (36.9 C)  99.9 F (37.7 C) 98.3 F (36.8 C)  TempSrc: Oral  Oral Oral  SpO2: 96% 96% 100% 99%  Weight:      Height:        Wt Readings from Last 3 Encounters:  05/08/18 68.7 kg  05/08/18 67.5 kg  04/21/18 67.6 kg     Intake/Output Summary (Last 24 hours) at 05/10/2018 1834 Last data filed at 05/10/2018 1300 Gross per 24 hour  Intake 1570.07 ml  Output 1650 ml  Net -79.93 ml   Physical Exam Patient is examined daily including today on 05/10/18 , exams remain the same as of yesterday except that has changed   Gen:- Awake Alert,  In no apparent distress  HEENT:- [place].AT, No sclera icterus, no visual complaints Neck-Supple Neck,No JVD,.  Lungs-  CTAB , fair symmetrical air movement CV- S1, S2 normal, regular  Abd-  +ve B.Sounds, Abd Soft, No tenderness,    Extremity:- No  edema, pedal pulses present  Psych-affect is appropriate, oriented  x3 Neuro-no new focal deficits, no tremors Skin -groin/scrotum/perineum with erythematous rash with satellite lesions consistent with candidal intertrigo GU--- Foley catheter with clear urine    Data Review:   Micro Results Recent Results (from the past 240 hour(s))  Urine culture     Status: None   Collection Time: 05/08/18  5:12 AM  Result Value Ref Range Status   Specimen Description   Final    URINE, CLEAN CATCH Performed at [facility], [address], [place], [place] [address]    Special Requests   Final    NONE Performed at [facility], [address] [place], [place] [address]    Culture   Final    NO GROWTH Performed at [facility], [place] [address] [place], [place] [address]    Report Status 05/09/2018 FINAL  Final  Urine Culture     Status: None   Collection Time: 05/08/18 11:22 AM  Result Value Ref Range Status   Specimen Description   Final    URINE, RANDOM Performed at [facility], [address], [place], [place] [address]    Special Requests   Final    NONE Performed at [facility], [address], [place], [place] [address]    Culture   Final    Multiple bacterial morphotypes present, none predominant. Suggest appropriate recollection if clinically indicated.   Report Status 05/09/2018 FINAL  Final  Culture, blood (Routine x 2)     Status: None (Preliminary result)   Collection Time: 05/08/18 11:52 AM  Result Value Ref Range Status   Specimen Description BLOOD  Final   Special Requests NONE  Final   Culture   Final    NO GROWTH 2 DAYS Performed at [facility], [address] [place], [place] [address]    Report Status PENDING  Incomplete  Culture, blood (Routine x 2)     Status: None (Preliminary result)   Collection Time: 05/08/18 11:52 AM  Result Value Ref Range Status   Specimen  Description   Final    BLOOD RIGHT ARM Performed at [facility], [address] [place], [place] [address]    Special Requests   Final    BOTTLES DRAWN  AEROBIC AND ANAEROBIC Blood Culture adequate volume Performed at [facility], [address], [place], [place] [address]    Culture  Setup Time   Final    YEAST Gram Stain Report Called to,Read Back By and Verified With: CULLEN,M. AT 0128 ON 05/10/2018 BY EVA AEROBIC BOTTLE ONLY Performed at [facility], READ BACK BY AND VERIFIED WITH: Iran Planas [phone] FCP Performed at [place] [address] [place], [place] [address]    Culture YEAST  Final   Report Status PENDING  Incomplete  Blood Culture (routine x 2)     Status: None (Preliminary result)   Collection Time: 05/08/18 11:52 AM  Result Value Ref Range Status   Specimen Description   Final    BLOOD LEFT HAND Performed at [facility], [address], [place], [place] [address]    Special Requests   Final    BOTTLES DRAWN AEROBIC AND ANAEROBIC Blood Culture adequate volume Performed at [facility], [address], [place], [place] [address]    Culture  Setup Time   Final    YEAST Gram Stain Report Called to,Read Back By and Verified With: [place] S. @ [id number] ON [id number] BY HENDERSON L. AEROBIC BOTTLE ONLY Performed at [facility], [place] [address] [place], [place] [address]    Culture PENDING  Incomplete   Report Status PENDING  Incomplete  Blood Culture (routine x 2)     Status: None (Preliminary result)   Collection Time: 05/08/18 11:52 AM  Result Value Ref Range Status   Specimen Description BLOOD  Final   Special Requests NONE  Final   Culture   Final    NO GROWTH 2 DAYS Performed at [facility], [address], [place],  [address]    Report Status PENDING  Incomplete  Blood Culture ID Panel (Reflexed)     Status: Abnormal   Collection Time: 05/08/18 11:52 AM  Result Value Ref Range Status   Enterococcus species NOT DETECTED NOT DETECTED Final   Listeria monocytogenes NOT DETECTED NOT DETECTED Final   Staphylococcus species NOT DETECTED NOT DETECTED  Final   Staphylococcus aureus (BCID) NOT DETECTED NOT DETECTED Final   Streptococcus species NOT DETECTED NOT DETECTED Final   Streptococcus agalactiae NOT DETECTED NOT DETECTED Final   Streptococcus pneumoniae NOT DETECTED NOT DETECTED Final   Streptococcus pyogenes NOT DETECTED NOT DETECTED Final   Acinetobacter baumannii NOT DETECTED NOT DETECTED Final   Enterobacteriaceae species NOT DETECTED NOT DETECTED Final   Enterobacter cloacae complex NOT DETECTED NOT DETECTED Final   Escherichia coli NOT DETECTED NOT DETECTED Final   Klebsiella oxytoca NOT DETECTED NOT DETECTED Final   Klebsiella pneumoniae NOT DETECTED NOT DETECTED Final   Proteus species NOT DETECTED NOT DETECTED Final   Serratia marcescens NOT DETECTED NOT DETECTED Final   Haemophilus influenzae NOT DETECTED NOT DETECTED Final   Neisseria meningitidis NOT DETECTED NOT DETECTED Final   Pseudomonas aeruginosa NOT DETECTED NOT DETECTED Final   Candida albicans DETECTED (A) NOT DETECTED Final    Comment: CRITICAL RESULT CALLED TO, READ BACK BY AND VERIFIED WITH: PHARMD SCOTT HALL [phone] FCP    Candida glabrata NOT DETECTED NOT DETECTED Final   Candida krusei NOT DETECTED NOT DETECTED Final  Candida parapsilosis NOT DETECTED NOT DETECTED Final   Candida tropicalis NOT DETECTED NOT DETECTED Final    Comment: Performed at [facility], [place] [address] [place], [place] [address]  Urine Culture     Status: None   Collection Time: 05/08/18  2:40 PM  Result Value Ref Range Status   Specimen Description   Final    URINE, CATHETERIZED Performed at [facility], [address], [place], [place] [address]    Special Requests   Final    Normal Performed at [facility], [address], [place], [place] [address]    Culture   Final    NO GROWTH Performed at [facility], [place] [address], [place], [place] [address]    Report Status 05/09/2018 FINAL  Final  MRSA PCR Screening     Status: None   Collection Time:  05/09/18  8:55 AM  Result Value Ref Range Status   MRSA by PCR NEGATIVE NEGATIVE Final    Comment:        The GeneXpert MRSA Assay (FDA approved for NASAL specimens only), is one component of a comprehensive MRSA colonization surveillance program. It is not intended to diagnose MRSA infection nor to guide or monitor treatment for MRSA infections. Performed at [facility], [address] [place], [place] [address]   Culture, blood (Routine X 2) w Reflex to ID Panel     Status: None (Preliminary result)   Collection Time: 05/10/18  9:26 AM  Result Value Ref Range Status   Specimen Description   Final    BLOOD LEFT HAND BOTTLES DRAWN AEROBIC AND ANAEROBIC   Special Requests   Final    Blood Culture adequate volume Performed at [facility], [address], [place], [place] [address]    Culture PENDING  Incomplete   Report Status PENDING  Incomplete  Culture, blood (Routine X 2) w Reflex to ID Panel     Status: None (Preliminary result)   Collection Time: 05/10/18  9:28 AM  Result Value Ref Range Status   Specimen Description   Final    BLOOD RIGHT HAND BOTTLES DRAWN AEROBIC AND ANAEROBIC   Special Requests   Final    Blood Culture adequate volume Performed at [facility], [address] [place], [place] [address]    Culture PENDING  Incomplete   Report Status PENDING  Incomplete    Radiology Reports Dg Chest 2 View  Result Date: 05/08/2018 CLINICAL DATA:  85 year old male with a history of nausea and shaking EXAM: CHEST - 2 VIEW COMPARISON:  01/03/2018 FINDINGS: Cardiomediastinal silhouette unchanged in size and contour. No evidence of central vascular congestion. No interlobular septal thickening. No pneumothorax or pleural effusion. Coarsened interstitial markings. No confluent airspace disease. No displaced fracture. IMPRESSION: Negative for acute cardiopulmonary disease, with background of chronic lung changes. Electronically Signed   By: Corrie Mckusick D.O.   On:  05/08/2018 12:16     CBC Recent Labs  Lab 05/08/18 1151 05/08/18 1152 05/09/18 0549 05/10/18 0605  WBC 12.2* 12.2* 17.4* 9.7  HGB 9.4* 9.5* 10.0* 9.6*  HCT 30.1* 30.2* 32.1* 30.9*  PLT 354 357 307 215  MCV 82.0 81.8 82.1 81.7  MCH 25.6* 25.7* 25.6* 25.4*  MCHC 31.2 31.5 31.2 31.1  RDW 14.4 14.3 14.7 14.7  LYMPHSABS 0.1* 0.1*  --   --   MONOABS 0.3 0.3  --   --   EOSABS 0.0 0.0  --   --   BASOSABS 0.0  0.0  --   --     Chemistries  Recent Labs  Lab 05/08/18 1151 05/08/18 1152 05/09/18 0549 05/10/18 0605  NA 134* 133* 133* 132*  K 3.7 3.6 2.9* 3.6  CL 105 101 103 103  CO2 20* 20* 19* 20*  GLUCOSE 175* 177* 101* 66*  BUN 20 20 16  26*  CREATININE 0.85 0.81 0.91 0.97  CALCIUM 8.3* 8.3* 7.8* 8.0*  MG  --   --   --  1.6*  AST 51* 50*  --   --   ALT 29 30  --   --   ALKPHOS 122 121  --   --   BILITOT 0.4 0.6  --   --    ------------------------------------------------------------------------------------------------------------------ No results for input(s): CHOL, HDL, LDLCALC, TRIG, CHOLHDL, LDLDIRECT in the last 72 hours.  Lab Results  Component Value Date   HGBA1C 9.6 (H) 01/04/2018   ------------------------------------------------------------------------------------------------------------------ No results for input(s): TSH, T4TOTAL, T3FREE, THYROIDAB in the last 72 hours.  Invalid input(s): FREET3 ------------------------------------------------------------------------------------------------------------------ No results for input(s): VITAMINB12, FOLATE, FERRITIN, TIBC, IRON, RETICCTPCT in the last 72 hours.  Coagulation profile Recent Labs  Lab 05/08/18 1151  INR 1.1    No results for input(s): DDIMER in the last 72 hours.  Cardiac Enzymes No results for input(s): CKMB, TROPONINI, MYOGLOBIN in the last 168 hours.  Invalid input(s):  CK ------------------------------------------------------------------------------------------------------------------ No results found for: BNP   Roxan Hockey M.D on 05/10/2018 at 6:34 PM  Go to [email or web address] - for contact info  [facility] - Office  [phone]

## 2018-05-10 NOTE — Progress Notes (Signed)
PHARMACY - PHYSICIAN COMMUNICATION CRITICAL VALUE ALERT - BLOOD CULTURE IDENTIFICATION (BCID)  Anthony Dunlap is an 85 y.o. male who presented to [facility] on 05/08/2018 with a chief complaint of fever  Assessment:  Called from lab re: yeast, candida albicans  Name of physician (or Provider) Contacted: Dr Tommy Medal from ID changed order to Fluconazole 800mg  x 1 today then 400mg  IV daily.  ID spoke with Dr Denton Brick per report.  Current antibiotics: Fluconazole as above  Changes to prescribed antibiotics recommended:    Results for orders placed or performed during the hospital encounter of 05/08/18  Blood Culture ID Panel (Reflexed) (Collected: 05/08/2018 11:52 AM)  Result Value Ref Range   Enterococcus species NOT DETECTED NOT DETECTED   Listeria monocytogenes NOT DETECTED NOT DETECTED   Staphylococcus species NOT DETECTED NOT DETECTED   Staphylococcus aureus (BCID) NOT DETECTED NOT DETECTED   Streptococcus species NOT DETECTED NOT DETECTED   Streptococcus agalactiae NOT DETECTED NOT DETECTED   Streptococcus pneumoniae NOT DETECTED NOT DETECTED   Streptococcus pyogenes NOT DETECTED NOT DETECTED   Acinetobacter baumannii NOT DETECTED NOT DETECTED   Enterobacteriaceae species NOT DETECTED NOT DETECTED   Enterobacter cloacae complex NOT DETECTED NOT DETECTED   Escherichia coli NOT DETECTED NOT DETECTED   Klebsiella oxytoca NOT DETECTED NOT DETECTED   Klebsiella pneumoniae NOT DETECTED NOT DETECTED   Proteus species NOT DETECTED NOT DETECTED   Serratia marcescens NOT DETECTED NOT DETECTED   Haemophilus influenzae NOT DETECTED NOT DETECTED   Neisseria meningitidis NOT DETECTED NOT DETECTED   Pseudomonas aeruginosa NOT DETECTED NOT DETECTED   Candida albicans DETECTED (A) NOT DETECTED   Candida glabrata NOT DETECTED NOT DETECTED   Candida krusei NOT DETECTED NOT DETECTED   Candida parapsilosis NOT DETECTED NOT DETECTED   Candida tropicalis NOT DETECTED NOT DETECTED    Hart Robinsons  A 05/10/2018  9:33 AM

## 2018-05-10 NOTE — Progress Notes (Addendum)
CRITICAL VALUE ALERT  Critical Value:  CBG 40  Date & Time Notied:  0040  Provider Notified: Standing orders initiated   Orders Received/Actions taken: per standing orders

## 2018-05-10 NOTE — Progress Notes (Signed)
Patients blood sugar up to 55 after initiating hypoglycemic protocol x 2 . Dr Olevia Bowens  paged and informed  dropping of blood sugar. Order given to start D10@50ml /hr. I will continue to monitor patient closely as shift progresses

## 2018-05-10 NOTE — Progress Notes (Signed)
Patient blood sugar at 40 at this time. Glucose gel given per protocol

## 2018-05-10 NOTE — Progress Notes (Signed)
   INFECTIOUS DISEASE ATTENDING ADDENDUM:   85 year old man with chronic indwelling Foley catheter who was admitted with fever tachycardia and elevated white blood cell count.  He was thought to be suffering from a urinary tract infection that had failed treatment with Augmentin and ciprofloxacin.  He is also found to have diffuse candidal intertrigo.  He was started on the ceftriaxone and also fluconazole at 200 mg/day.  Urine cultures from admission grew multiple organisms.  His Foley catheter was exchanged.  Blood cultures have subsequently grown Candida albicans.  I would discontinue the ceftriaxone  I would load him with fluconazole with a typical loading dose for a patient with normal renal function being 800 mg IV followed by 400 mg of fluconazole daily.  I would repeat blood cultures  He will need a 2-week course of fluconazole which can largely be taken orally  Needs to be examined for any problems with his vision at the bedside.  If he has new visual problems I would transfer him to [facility] or [facility] [facility] where he can be seen by an ophthalmologist on-call.  Even if he does not have visual problems he SHOULD have a dilated fundoscopic exam by ophthalmology to exclude fungal endophthalmitis.  Typically I prefer for this to be done in the hospital where the patient is a "captive audience" so to speak.  That being said I am not against him being seen in an ophthalmology office with a dilated funduscopic exam if that is more achievable in [place].  Certainly this does need to be done though and arrange prior to discharge.  I will continue to follow along

## 2018-05-10 NOTE — Progress Notes (Signed)
CBG re-checked and noted to be @90  at this time

## 2018-05-11 LAB — GLUCOSE, CAPILLARY
Glucose-Capillary: 111 mg/dL — ABNORMAL HIGH (ref 70–99)
Glucose-Capillary: 159 mg/dL — ABNORMAL HIGH (ref 70–99)
Glucose-Capillary: 66 mg/dL — ABNORMAL LOW (ref 70–99)
Glucose-Capillary: 89 mg/dL (ref 70–99)
Glucose-Capillary: 79 mg/dL (ref 70–99)

## 2018-05-11 NOTE — Progress Notes (Signed)
Blood glucose 66 prior to supper.  Tech gave soft drink and patient encouraged to eat.  Recheck was 89.  Walked patient in room to door and back with walker and one assist.  Unsteady gait.

## 2018-05-11 NOTE — Progress Notes (Signed)
Patient Demographics:    Anthony Dunlap, is a 85 y.o. male, DOB - 1933-03-08, UTM:[id number]  Admit date - 05/08/2018   Admitting Physician Ahmad Vanwey Denton Brick, MD  Outpatient Primary MD for the patient is [name], MD  LOS - 3  Chief Complaint  Patient presents with  . Fever        Subjective:    Anthony Dunlap today has no fevers, no emesis,  No chest pain, no further significant hypoglycemic episodes, continues to deny any visual disturbance,   No fevers, no vomiting    Assessment  & Plan :    Principal Problem:   Fungemia/Candida albicans Active Problems:   Type 2 diabetes mellitus ([name])   Chronic anemia   Chronic indwelling Foley catheter   Hypothyroidism   UTI (urinary tract infection)   Complicated UTI (urinary tract infection)  Brief Summary:- 85 y.o. male with PMHx of DM2, HLD and Hypothyroidism as well as chronic Anemia and chronic urinary Retention with chronic indwelling Foley catheter in the setting of prior history of prostate cancer admitted on 05/08/2018 with presumed UTI after presenting to the ED twice within a 24-hour period with urinary and Foley catheter complaints, now found to have fungemia with Candida albicans... Phone consult with infectious disease specialist 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... Continue high-dose IV fluconazole per protocol (received 800 mg x 1 on 05/10/2018 now on 400 mg daily), repeat blood cultures 05/10/2018 ending... Patient will need 2 weeks of fluconazole (IV and orally) combined for about 2 weeks from first negative culture... Patient denies any visual concerns at this time patient will need ophthalmology evaluation as outpatient given fungemia  2)SIRS secondary to Presumed  complicated UTI---POA--patient is afebrile, , leukocytosis has resolved,  overall hemodynamically stable at this time, patient with chronically indwelling Foley catheter last changed 2 weeks PTA, Foley changed again on 05/08/2018, STOP  IV Rocephin on 05/11/18 as  blood and urine cultures are without bacterial growth,   3)Candidal intertrigo with possible Candida UTI, IV Diflucan as ordered--use topical nystatin cream to groin/scrotum/perineum--- patient now has Candida Albicans in his blood, see #1 above  4 Unspecified/unknown etiology Chronic Anemia--- ??? Etiology, stable with hemoglobin around 10 which is close to patient's baseline,    continue iron supplementation  5)Hypothyroidism--- patient's TSH was 6.3 on February 11, 2018, continue levothyroxine 100 mcg dialy  6)HLD--stable, continue Pravastatin 10 mg daily with aspirin 81 mg daily  7)DM2--no frank DKA, as A1c was 9.6 in November 2019, reflecting poor diabetic control.  No further episodes of episodes of hypoglycemia , continue Levemir insulin at decreased dose of  6 units from 18 units,   continue Amaryl 4 mg with breakfast, use Novolog/Humalog Sliding scale insulin with Accu-Cheks/Fingersticks as ordered   Disposition/Need for in-Hospital Stay- patient unable to be discharged at this time due to fungemia requiring high-dose IV fluconazole, also awaiting Repeat blood culture results , patient previously failed oral antibiotics x2 as outpatient PTA  Code Status : full  Family Communication:   na  Disposition Plan  : home  Consults  : Phone consult with infectious disease  specialist [name] on 05/10/2018 and 05/11/2018  DVT Prophylaxis  :    - Heparin -  Lab Results  Component Value Date   PLT 215 05/10/2018    Inpatient Medications  Scheduled Meds: . aspirin EC  81 mg Oral Daily  . ferrous sulfate  325 mg Oral Q breakfast  . glimepiride  4 mg Oral Daily  . heparin  5,000 Units Subcutaneous Q8H  . insulin aspart   0-5 Units Subcutaneous QHS  . insulin aspart  0-9 Units Subcutaneous TID WC  . insulin detemir  6 Units Subcutaneous Daily  . levothyroxine  100 mcg Oral Daily  . magnesium oxide  400 mg Oral Daily  . multivitamin with minerals  1 tablet Oral Daily  . nutrition supplement (JUVEN)  1 packet Oral BID BM  . nystatin   Topical BID  . pravastatin  10 mg Oral q1800  . sodium chloride flush  3 mL Intravenous Once  . sodium chloride flush  3 mL Intravenous Q12H   Continuous Infusions: . sodium chloride    . 0.9 % NaCl with KCl 20 mEq / L 50 mL/hr at 05/11/18 1012  . fluconazole (DIFLUCAN) IV     PRN Meds:.sodium chloride, acetaminophen **OR** acetaminophen, albuterol, ondansetron **OR** ondansetron (ZOFRAN) IV, polyethylene glycol, sodium chloride flush, traZODone    Anti-infectives (From admission, onward)   Start     Dose/Rate Route Frequency Ordered Stop   05/11/18 1000  fluconazole (DIFLUCAN) IVPB 400 mg     400 mg 100 mL/hr over 120 Minutes Intravenous Every 24 hours 05/10/18 0925     05/11/18 0000  anidulafungin (ERAXIS) 100 mg in sodium chloride 0.9 % 100 mL IVPB  Status:  Discontinued     100 mg 78 mL/hr over 100 Minutes Intravenous Every 24 hours 05/10/18 0911 05/10/18 0924   05/10/18 1230  fluconazole (DIFLUCAN) IVPB 400 mg     400 mg 100 mL/hr over 120 Minutes Intravenous  Once 05/10/18 0945 05/10/18 2054   05/10/18 1130  fluconazole (DIFLUCAN) IVPB 400 mg  Status:  Discontinued     400 mg 100 mL/hr over 120 Minutes Intravenous  Once 05/10/18 0940 05/10/18 0943   05/10/18 1030  fluconazole (DIFLUCAN) IVPB 800 mg  Status:  Discontinued     800 mg 200 mL/hr over 120 Minutes Intravenous  Once 05/10/18 0925 05/10/18 0937   05/10/18 1030  fluconazole (DIFLUCAN) IVPB 400 mg  Status:  Discontinued     400 mg 100 mL/hr over 120 Minutes Intravenous  Once 05/10/18 0940 05/10/18 0943   05/10/18 1030  fluconazole (DIFLUCAN) IVPB 400 mg     400 mg 100 mL/hr over 120 Minutes  Intravenous  Once 05/10/18 0945 05/10/18 1644   05/10/18 0915  anidulafungin (ERAXIS) 200 mg in sodium chloride 0.9 % 200 mL IVPB  Status:  Discontinued     200 mg 78 mL/hr over 200 Minutes Intravenous  Once 05/10/18 0911 05/10/18 0925   05/09/18 1200  cefTRIAXone (ROCEPHIN) 1 g in sodium chloride 0.9 % 100 mL IVPB  Status:  Discontinued     1 g 200 mL/hr over 30 Minutes Intravenous Every 24 hours 05/08/18 1643 05/10/18 1730   05/08/18 1800  fluconazole (DIFLUCAN) IVPB 200 mg  Status:  Discontinued     200 mg 100 mL/hr over 60 Minutes Intravenous Every 24 hours 05/08/18 1750 05/10/18 0911   05/08/18 1400  fluconazole (DIFLUCAN) IVPB 200 mg  Status:  Discontinued     200 mg 100  mL/hr over 60 Minutes Intravenous Every 24 hours 05/08/18 1356 05/08/18 1750   05/08/18 1145  cefTRIAXone (ROCEPHIN) 1 g in sodium chloride 0.9 % 100 mL IVPB     1 g 200 mL/hr over 30 Minutes Intravenous  Once 05/08/18 1136 05/08/18 1235        Objective:   Vitals:   05/10/18 0440 05/10/18 1400 05/10/18 2131 05/11/18 0611  BP: 134/79 129/80 (!) 147/82 (!) 150/88  Pulse: (!) 107 89 100 (!) 107  Resp: 15 16 16 17   Temp: 98.3 F (36.8 C) 98.1 F (36.7 C) 98.3 F (36.8 C) 98.4 F (36.9 C)  TempSrc: Oral  Oral Oral  SpO2: 99% 98% 100% 97%  Weight:      Height:        Wt Readings from Last 3 Encounters:  05/08/18 68.7 kg  05/08/18 67.5 kg  04/21/18 67.6 kg     Intake/Output Summary (Last 24 hours) at 05/11/2018 1151 Last data filed at 05/11/2018 0900 Gross per 24 hour  Intake 720 ml  Output 4600 ml  Net -3880 ml   Physical Exam Patient is examined daily including today on 05/11/18 , exams remain the same as of yesterday except that has changed   Gen:- Awake Alert,  In no apparent distress  HEENT:- [place].AT, No sclera icterus, no visual complaints Neck-Supple Neck,No JVD,.  Lungs-  CTAB , fair symmetrical air movement CV- S1, S2 normal, regular  Abd-  +ve B.Sounds, Abd Soft, No tenderness, no CVA  area tenderness Extremity:- No  edema, pedal pulses present  Psych-affect is appropriate, oriented x3 Neuro-no new focal deficits, no tremors Skin -groin/scrotum/perineum with erythematous rash with satellite lesions consistent with candidal intertrigo GU--- Foley catheter with clear urine    Data Review:   Micro Results Recent Results (from the past 240 hour(s))  Urine culture     Status: None   Collection Time: 05/08/18  5:12 AM  Result Value Ref Range Status   Specimen Description   Final    URINE, CLEAN CATCH Performed at [facility], [address], [place], [place] [address]    Special Requests   Final    NONE Performed at [facility], [address] [place], [place] [address]    Culture   Final    NO GROWTH Performed at [facility], [place] [address] [place], [place] [address]    Report Status 05/09/2018 FINAL  Final  Urine Culture     Status: None   Collection Time: 05/08/18 11:22 AM  Result Value Ref Range Status   Specimen Description   Final    URINE, RANDOM Performed at [facility], [address], [place], [place] [address]    Special Requests   Final    NONE Performed at [facility], [address] [place], [place] [address]    Culture   Final    Multiple bacterial morphotypes present, none predominant. Suggest appropriate recollection if clinically indicated.   Report Status 05/09/2018 FINAL  Final  Culture, blood (Routine x 2)     Status: None (Preliminary result)   Collection Time: 05/08/18 11:52 AM  Result Value Ref Range Status   Specimen Description BLOOD  Final   Special Requests NONE  Final   Culture   Final    NO GROWTH 3 DAYS Performed at [facility], [address], [place], [place] [address]    Report Status PENDING  Incomplete  Culture, blood (Routine x 2)     Status:  Abnormal (Preliminary result)   Collection Time: 05/08/18 11:52 AM  Result Value Ref Range Status   Specimen Description   Final    BLOOD RIGHT ARM Performed at  [facility], [address] [place], [place] [address]    Special Requests   Final    BOTTLES DRAWN AEROBIC AND ANAEROBIC Blood Culture adequate volume Performed at [facility], [address], [place], [place] [address]    Culture  Setup Time   Final    YEAST Gram Stain Report Called to,Read Back By and Verified With: CULLEN,M. AT 0128 ON 05/10/2018 BY EVA AEROBIC BOTTLE ONLY Performed at [place], READ BACK BY AND VERIFIED WITH: [place] Planas [phone] [id number] FCP Performed at [facility], [place] [address] [place], [place] [address]    Culture CANDIDA ALBICANS (A)  Final   Report Status PENDING  Incomplete  Blood Culture (routine x 2)     Status: Abnormal (Preliminary result)   Collection Time: 05/08/18 11:52 AM  Result Value Ref Range Status   Specimen Description   Final    BLOOD LEFT HAND Performed at [facility], [address], [place], [place] [address]    Special Requests   Final    BOTTLES DRAWN AEROBIC AND ANAEROBIC Blood Culture adequate volume Performed at [facility], [address] [place], [place] [address]    Culture  Setup Time   Final    YEAST Gram Stain Report Called to,Read Back By and Verified With: [place] S. @ [id number] ON [id number] BY HENDERSON L. AEROBIC BOTTLE ONLY Performed at [facility], [place] [address], [place], [place] [address]    Culture YEAST (A)  Final   Report Status PENDING  Incomplete  Blood Culture (routine x 2)     Status: None (Preliminary result)   Collection Time: 05/08/18 11:52 AM  Result Value Ref Range Status   Specimen Description BLOOD  Final   Special Requests NONE  Final   Culture   Final    NO GROWTH 3 DAYS Performed at [facility], [address] [place], [place] [address]    Report Status PENDING  Incomplete  Blood Culture ID Panel (Reflexed)     Status: Abnormal   Collection Time: 05/08/18 11:52 AM  Result Value Ref Range Status   Enterococcus species NOT DETECTED NOT  DETECTED Final   Listeria monocytogenes NOT DETECTED NOT DETECTED Final   Staphylococcus species NOT DETECTED NOT DETECTED Final   Staphylococcus aureus (BCID) NOT DETECTED NOT DETECTED Final   Streptococcus species NOT DETECTED NOT DETECTED Final   Streptococcus agalactiae NOT DETECTED NOT DETECTED Final   Streptococcus pneumoniae NOT DETECTED NOT DETECTED Final   Streptococcus pyogenes NOT DETECTED NOT DETECTED Final   Acinetobacter baumannii NOT DETECTED NOT DETECTED Final   Enterobacteriaceae species NOT DETECTED NOT DETECTED Final   Enterobacter cloacae complex NOT DETECTED NOT DETECTED Final   Escherichia coli NOT DETECTED NOT DETECTED Final   Klebsiella oxytoca NOT DETECTED NOT DETECTED Final   Klebsiella pneumoniae NOT DETECTED NOT DETECTED Final   Proteus species NOT DETECTED NOT DETECTED Final   Serratia marcescens NOT DETECTED NOT DETECTED Final   Haemophilus influenzae NOT DETECTED NOT DETECTED Final   Neisseria meningitidis NOT DETECTED NOT DETECTED Final   Pseudomonas aeruginosa NOT DETECTED NOT DETECTED Final   Candida albicans DETECTED (A) NOT DETECTED Final    Comment: CRITICAL RESULT CALLED TO, READ BACK BY AND VERIFIED WITH: [place]  [phone] FCP    Candida glabrata NOT DETECTED NOT DETECTED Final   Candida krusei NOT DETECTED NOT DETECTED Final   Candida parapsilosis NOT DETECTED NOT DETECTED Final   Candida tropicalis NOT DETECTED NOT DETECTED Final    Comment: Performed at [facility], [place] [address] [place], [place] [address]  Urine Culture     Status: None   Collection Time: 05/08/18  2:40 PM  Result Value Ref Range Status   Specimen Description   Final    URINE, CATHETERIZED Performed at [facility], [address], [place], [place] [address]    Special Requests   Final    Normal Performed at [facility], [address] [place], [place] [address]    Culture   Final    NO GROWTH Performed at [facility], [place] [address] [place], [place] [address]    Report Status 05/09/2018 FINAL  Final  MRSA PCR Screening     Status: None   Collection Time: 05/09/18  8:55 AM  Result Value Ref Range Status   MRSA by PCR NEGATIVE NEGATIVE Final    Comment:        The GeneXpert MRSA Assay (FDA approved for NASAL specimens only), is one component of a comprehensive MRSA colonization surveillance program. It is not intended to diagnose MRSA infection nor to guide or monitor treatment for MRSA infections. Performed at [facility], [address] [place], [place] [address]   Culture, blood (Routine X 2) w Reflex to ID Panel     Status: None (Preliminary result)   Collection Time: 05/10/18  9:26 AM  Result Value Ref Range Status   Specimen Description   Final    BLOOD LEFT HAND BOTTLES DRAWN AEROBIC AND ANAEROBIC   Special Requests Blood Culture adequate volume  Final   Culture   Final    NO GROWTH < 24 HOURS Performed at [facility], [address], [place], [place] [address]    Report Status PENDING  Incomplete  Culture, blood (Routine X 2) w Reflex to ID Panel     Status: None (Preliminary result)   Collection Time: 05/10/18  9:28 AM  Result Value Ref Range Status   Specimen Description   Final    BLOOD RIGHT HAND BOTTLES DRAWN AEROBIC AND ANAEROBIC   Special Requests Blood Culture adequate volume  Final   Culture   Final    NO GROWTH < 24 HOURS Performed at [facility], [address], [place], [place] [address]    Report Status PENDING  Incomplete    Radiology Reports Dg Chest 2 View  Result Date: 05/08/2018 CLINICAL DATA:  85 year old male with a history of nausea and shaking EXAM: CHEST - 2 VIEW COMPARISON:  01/03/2018 FINDINGS: Cardiomediastinal silhouette unchanged in size and contour. No evidence of central vascular congestion. No interlobular septal thickening. No pneumothorax or pleural effusion. Coarsened interstitial markings. No confluent airspace disease. No displaced fracture. IMPRESSION:  Negative for acute cardiopulmonary disease, with background of chronic lung changes. Electronically Signed   By: Corrie Mckusick D.O.   On: 05/08/2018 12:16     CBC Recent Labs  Lab 05/08/18 1151 05/08/18 1152 05/09/18 0549 05/10/18 0605  WBC 12.2* 12.2* 17.4* 9.7  HGB 9.4* 9.5* 10.0* 9.6*  HCT 30.1* 30.2* 32.1* 30.9*  PLT 354 357 307 215  MCV 82.0 81.8 82.1 81.7  MCH 25.6* 25.7* 25.6* 25.4*  MCHC 31.2 31.5 31.2 31.1  RDW 14.4 14.3 14.7 14.7  LYMPHSABS 0.1* 0.1*  --   --   MONOABS 0.3 0.3  --   --   EOSABS 0.0 0.0  --   --   BASOSABS 0.0 0.0  --   --     Chemistries  Recent Labs  Lab 05/08/18 1151 05/08/18 1152 05/09/18 0549 05/10/18 0605  NA 134* 133* 133* 132*  K 3.7 3.6 2.9* 3.6  CL 105 101 103 103  CO2 20* 20* 19* 20*  GLUCOSE 175* 177* 101* 66*  BUN 20 20 16  26*  CREATININE 0.85 0.81 0.91 0.97  CALCIUM 8.3* 8.3* 7.8* 8.0*  MG  --   --   --  1.6*  AST 51* 50*  --   --   ALT 29 30  --   --   ALKPHOS 122 121  --   --   BILITOT 0.4 0.6  --   --    ------------------------------------------------------------------------------------------------------------------ No results for input(s): CHOL, HDL, LDLCALC, TRIG, CHOLHDL, LDLDIRECT in the last 72 hours.  Lab Results  Component Value Date   HGBA1C 9.6 (H) 01/04/2018   ------------------------------------------------------------------------------------------------------------------ No results for input(s): TSH, T4TOTAL, T3FREE, THYROIDAB in the last 72 hours.  Invalid input(s): FREET3 ------------------------------------------------------------------------------------------------------------------ No results for input(s): VITAMINB12, FOLATE, FERRITIN, TIBC, IRON, RETICCTPCT in the last 72 hours.  Coagulation profile Recent Labs  Lab 05/08/18 1151  INR 1.1    No results for input(s): DDIMER in the last 72 hours.  Cardiac Enzymes No results for input(s): CKMB, TROPONINI, MYOGLOBIN in the last 168  hours.  Invalid input(s): CK ------------------------------------------------------------------------------------------------------------------ No results found for: BNP   Roxan Hockey M.D on 05/11/2018 at 11:51 AM  Go to [email or web address] - for contact info  [facility] - Office  [phone]

## 2018-05-11 NOTE — Progress Notes (Addendum)
   INFECTIOUS DISEASE ATTENDING ADDENDUM:   85 year old man with chronic indwelling Foley catheter who was admitted with fever tachycardia and elevated white blood cell count.  He was thought to be suffering from a urinary tract infection that had failed treatment with Augmentin and ciprofloxacin.  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  I had taken him off antimicrobials in the interim.  Now admitted with Candida albicans fungemia  His repeat blood cultures are no growth to date on the lower dose fluconazole and has been loaded with high-dose fluconazole 800 mg now receiving 400 mg and he will require a 2 week course of antifungals.  Not have visual changes.  He should be evaluated by an ophthalmologist at minimum as an outpatient to ensure no fungal endophthalmitis.  I would make sure this is set up prior to discharge.  I have discussed case with Dr. Denton Brick.  I have asked microbiology lab to send off susceptibilities for the Candida albicans species  The blood cultures I ordered to ensure clearance should come to my Epic inbox but please call me if they turn positive and I have not reacted yet.  Otherwise I will sign off for now  Patient can have followup with our clinic using remote, telephonic visit in one months time.  I dont want him having to come back to the clinic or hospital unless absolutely necessary.

## 2018-05-11 NOTE — Progress Notes (Signed)
Daughter, [name], called and wanted to speak to MD.  Sent chat message to Dr. Denton Brick with her phone number.

## 2018-05-12 LAB — CBC
HCT: 32.6 % — ABNORMAL LOW (ref 39.0–52.0)
Hemoglobin: 10 g/dL — ABNORMAL LOW (ref 13.0–17.0)
MCH: 24.8 pg — ABNORMAL LOW (ref 26.0–34.0)
MCHC: 30.7 g/dL (ref 30.0–36.0)
MCV: 80.9 fL (ref 80.0–100.0)
Platelets: 223 10*3/uL (ref 150–400)
RBC: 4.03 MIL/uL — AB (ref 4.22–5.81)
RDW: 14.4 % (ref 11.5–15.5)
WBC: 14 10*3/uL — ABNORMAL HIGH (ref 4.0–10.5)
nRBC: 0 % (ref 0.0–0.2)

## 2018-05-12 LAB — COMPREHENSIVE METABOLIC PANEL
ALT: 164 U/L — ABNORMAL HIGH (ref 0–44)
AST: 164 U/L — ABNORMAL HIGH (ref 15–41)
Albumin: 2.3 g/dL — ABNORMAL LOW (ref 3.5–5.0)
Alkaline Phosphatase: 521 U/L — ABNORMAL HIGH (ref 38–126)
Anion gap: 8 (ref 5–15)
BUN: 19 mg/dL (ref 8–23)
CO2: 24 mmol/L (ref 22–32)
Calcium: 8.5 mg/dL — ABNORMAL LOW (ref 8.9–10.3)
Chloride: 104 mmol/L (ref 98–111)
Creatinine, Ser: 0.81 mg/dL (ref 0.61–1.24)
GFR calc Af Amer: >60 mL/min (ref >60)
GFR calc non Af Amer: >60 mL/min (ref >60)
Glucose, Bld: 110 mg/dL — ABNORMAL HIGH (ref 70–99)
Potassium: 2.9 mmol/L — ABNORMAL LOW (ref 3.5–5.1)
Sodium: 136 mmol/L (ref 135–145)
Total Bilirubin: 2.5 mg/dL — ABNORMAL HIGH (ref 0.3–1.2)
Total Protein: 6.2 g/dL — ABNORMAL LOW (ref 6.5–8.1)

## 2018-05-12 LAB — GLUCOSE, CAPILLARY
Glucose-Capillary: 125 mg/dL — ABNORMAL HIGH (ref 70–99)
Glucose-Capillary: 240 mg/dL — ABNORMAL HIGH (ref 70–99)
Glucose-Capillary: 161 mg/dL — ABNORMAL HIGH (ref 70–99)
Glucose-Capillary: 107 mg/dL — ABNORMAL HIGH (ref 70–99)
Glucose-Capillary: 57 mg/dL — ABNORMAL LOW (ref 70–99)
Glucose-Capillary: 111 mg/dL — ABNORMAL HIGH (ref 70–99)
Glucose-Capillary: 77 mg/dL (ref 70–99)

## 2018-05-12 LAB — CULTURE, BLOOD (ROUTINE X 2): Special Requests: ADEQUATE

## 2018-05-12 MED ORDER — SODIUM CHLORIDE 0.9 % IV SOLN
200.0000 mg | Freq: Once | INTRAVENOUS | Status: AC
  Administered 2018-05-12: 200 mg via INTRAVENOUS
  Filled 2018-05-12: qty 200

## 2018-05-12 MED ORDER — POTASSIUM CHLORIDE CRYS ER 20 MEQ PO TBCR
40.0000 meq | EXTENDED_RELEASE_TABLET | Freq: Once | ORAL | Status: AC
  Administered 2018-05-12: 40 meq via ORAL
  Filled 2018-05-12: qty 2

## 2018-05-12 MED ORDER — GUAIFENESIN-DM 100-10 MG/5ML PO SYRP
5.0000 mL | ORAL_SOLUTION | ORAL | Status: DC | PRN
  Administered 2018-05-12: 5 mL via ORAL
  Filled 2018-05-12: qty 5

## 2018-05-12 MED ORDER — SODIUM CHLORIDE 0.9 % IV SOLN
200.0000 mg | Freq: Once | INTRAVENOUS | Status: DC
  Filled 2018-05-12: qty 200

## 2018-05-12 MED ORDER — SODIUM CHLORIDE 0.9 % IV SOLN
100.0000 mg | INTRAVENOUS | Status: DC
  Administered 2018-05-13 – 2018-05-14 (×2): 100 mg via INTRAVENOUS
  Filled 2018-05-12 (×3): qty 100

## 2018-05-12 MED ORDER — SODIUM CHLORIDE 0.9 % IV SOLN
200.0000 mg | INTRAVENOUS | Status: DC
  Filled 2018-05-12: qty 200

## 2018-05-12 NOTE — Plan of Care (Signed)
  Problem: Acute Rehab OT Goals (only OT should resolve) Goal: Pt. Will Perform Grooming Flowsheets (Taken 05/12/2018 0913) Pt Will Perform Grooming: with supervision; standing Goal: Pt. Will Perform Upper Body Bathing Flowsheets (Taken 05/12/2018 0914) Pt Will Perform Upper Body Bathing: with supervision; sitting Goal: Pt. Will Perform Upper Body Dressing Flowsheets (Taken 05/12/2018 0914) Pt Will Perform Upper Body Dressing: with supervision; sitting Goal: Pt. Will Transfer To Toilet Flowsheets (Taken 05/12/2018 [phone]) Pt Will Transfer to Toilet: with supervision; stand pivot transfer; bedside commode; ambulating; grab bars Goal: Pt. Will Perform Toileting-Clothing Manipulation Flowsheets (Taken 05/12/2018 0914) Pt Will Perform Toileting - Clothing Manipulation and hygiene: with supervision; sitting/lateral leans; sit to/from stand Goal: Pt/Caregiver Will Perform Home Exercise Program Flowsheets (Taken 05/12/2018 [phone]) Pt/caregiver will Perform Home Exercise Program: Increased strength; Both right and left upper extremity; With Supervision; With written HEP provided

## 2018-05-12 NOTE — Plan of Care (Signed)
  Problem: Acute Rehab PT Goals(only PT should resolve) Goal: Pt Will Go Supine/Side To Sit Flowsheets (Taken 05/12/2018 0922) Pt will go Supine/Side to Sit: with supervision; with min guard assist Goal: Pt Will Go Sit To Supine/Side Flowsheets (Taken 05/12/2018 0922) Pt will go Sit to Supine/Side: with min guard assist; with supervision Goal: Patient Will Transfer Sit To/From Stand Flowsheets (Taken 05/12/2018 [phone]) Patient will transfer sit to/from stand: with min guard assist Goal: Pt Will Transfer Bed To Chair/Chair To Bed Flowsheets (Taken 05/12/2018 0922) Pt will Transfer Bed to Chair/Chair to Bed: min guard assist Goal: Pt Will Ambulate Flowsheets (Taken 05/12/2018 0922) Pt will Ambulate: 25 feet; with minimal assist; with rolling walker     Geraldine Solar PT, DPT

## 2018-05-12 NOTE — TOC Progression Note (Signed)
Transition of Care [facility]) - Progression Note    Patient Details  Name: Anthony LUMPKIN MRN: [id number] Date of Birth: 08/21/1933  [facility] CM/SW Contact  Roda Shutters Margretta Sidle, RN Phone Number: 05/12/2018, 10:36 AM  Clinical Narrative:   Plan has changed to SNF. dsicussed with pt who is agreeable, discussed with daughter who is agreeable and requests [facility] as first choice. Bed offered, accepted and ins auth started by this TOC member.    Expected Discharge Plan: [place]    Expected Discharge Plan and Services Expected Discharge Plan: [place] arrangements for the past 2 months: Single Family Home                   Patient Goals and CMS Choice Patient states their goals for this hospitalization and ongoing recovery are:: gain strength CMS Medicare.gov Compare Post Acute Care list provided to:: Patient Choice offered to / list presented to : Patient, Adult Children  Readmission Risk Interventions Readmission Risk Prevention Plan 05/09/2018  Transportation Screening Complete  PCP or Specialist Appt within 3-5 Days Complete  HRI or Home Care Consult Complete  Social Work Consult for [place] Planning/Counseling Complete  Palliative Care Screening Not Applicable  Medication Review Press photographer) Complete  Some recent data might be hidden

## 2018-05-12 NOTE — Evaluation (Signed)
Physical Therapy Evaluation Patient Details Name: Anthony Dunlap MRN: [id number] DOB: 15-May-1933 Today's Date: 05/12/2018   History of Present Illness  85 y.o. male with PMHx of DM2, HLD and Hypothyroidism as well as chronic Anemia and chronic urinary Retention with chronic indwelling Foley catheter in the setting of prior history of prostate cancer admitted on 05/08/2018 with presumed UTI after presenting to the ED twice within a 24-hour period with urinary and Foley catheter complaints, now found to have fungemia with Candida albicans..  Clinical Impression  Pt received in bed eating breakfast and was agreeable to PT evaluation. RN entered at beginning of session and assisted in repositioning pt in bed. OT present for most of session for co-treatment. Pt from home but unsure if he lives alone or with wife (pt stated with wife, but MD under the impression he lived alone), where pt reports he was independent with his ADLs, IADLs, and gait with/without AD. Pt currently requiring min A for bed mobility and he demo'd poor sitting balnace once EOB AEB increased posterior lean and difficulty correcting without assistance. He was min-mod A for STS from varying heights and min-mod A for gait with RW in room. Pt currently functioning below PLOF and PT recommending SNF upon d/c once medically ready to d/c due to his deficits in strength, balance, and functional mobility in order to reduce his risk for falls and promote return to PLOF. Will follow acutely.       Follow Up Recommendations SNF    Equipment Recommendations  None recommended by PT    Recommendations for Other Services       Precautions / Restrictions Precautions Precautions: Fall Restrictions Weight Bearing Restrictions: No      Mobility  Bed Mobility Overal bed mobility: Needs Assistance Bed Mobility: Supine to Sit     Supine to sit: Min assist     General bed mobility comments: Once seated on EOB, patient had increased  difficulty maintaining his static sitting balance and required min-mod physical assistance and VC to remain upright. Able to maintain position for a short amount of time before requiring physical assistance.   Transfers Overall transfer level: Needs assistance Equipment used: Rolling walker (2 wheeled) Transfers: Sit to/from [facility] Sit to Stand: Min assist;Mod assist Stand pivot transfers: Min assist       General transfer comment: from elevated bed, toilet, and SPT to chair with RW; cues for safety throughout  Ambulation/Gait Ambulation/Gait assistance: Min assist;Mod assist Gait Distance (Feet): 12 Feet Assistive device: Rolling walker (2 wheeled) Gait Pattern/deviations: Step-to pattern;Decreased stride length;Staggering left;Staggering right Gait velocity: decreased   General Gait Details: amb in room bed > toilet > chair with RW; unsteady gait, cues for upright posture  Stairs            Wheelchair Mobility    Modified Rankin (Stroke Patients Only)       Balance Overall balance assessment: Needs assistance Sitting-balance support: Feet supported Sitting balance-Leahy Scale: Fair Sitting balance - Comments: cues for upright posture to improve balace at EOB as he had increased posterior lean Postural control: Posterior lean Standing balance support: Bilateral upper extremity supported Standing balance-Leahy Scale: Poor Standing balance comment: fair/poor with RW                             Pertinent Vitals/Pain Pain Assessment: No/denies pain    Home Living Family/patient expects to be discharged to:: Private residence Living Arrangements:  Spouse/significant other Available Help at Discharge: Family Type of Home: House Home Access: Ramped entrance     Home Layout: One level Home Equipment: Bedside commode;Shower seat;Walker - 2 wheels;Cane - single point Additional Comments: pt's wife has Alzheimers, kids help out but  unsure if they live with them    Prior Function Level of Independence: Independent with assistive device(s)         Comments: pt reports independence with ADLs, IADLs, and states he was still driving; states if he was outdoors he would use a cane or RW and reports that he could walk unlimted community distance.     Hand Dominance   Dominant Hand: Right    Extremity/Trunk Assessment   Upper Extremity Assessment Upper Extremity Assessment: Generalized weakness    Lower Extremity Assessment Lower Extremity Assessment: Generalized weakness;Defer to PT evaluation    Cervical / Trunk Assessment Cervical / Trunk Assessment: Kyphotic  Communication   Communication: No difficulties  Cognition Arousal/Alertness: Awake/alert Behavior During Therapy: WFL for tasks assessed/performed Overall Cognitive Status: No family/caregiver present to determine baseline cognitive functioning                                 General Comments: no alert to the year (thought it was 2001 or 2002) but able to name the month. Pt with increased difficulty with short memory during Physician teach back method when discussing care.       General Comments      Exercises     Assessment/Plan    PT Assessment Patient needs continued PT services  PT Problem List Decreased strength;Decreased activity tolerance;Decreased balance;Decreased mobility;Decreased safety awareness       PT Treatment Interventions DME instruction;Gait training;Functional mobility training;Therapeutic activities;Therapeutic exercise;Balance training;Neuromuscular re-education;Patient/family education;Manual techniques    PT Goals (Current goals can be found in the Care Plan section)  Acute Rehab PT Goals Patient Stated Goal: home PT Goal Formulation: With patient Time For Goal Achievement: 05/19/18 Potential to Achieve Goals: Good    Frequency Min 3X/week   Barriers to discharge Decreased caregiver support       Co-evaluation   Reason for Co-Treatment: To address functional/ADL transfers   OT goals addressed during session: ADL's and self-care;Strengthening/ROM;Proper use of Adaptive equipment and DME       AM-PAC PT "6 Clicks" Mobility  Outcome Measure Help needed turning from your back to your side while in a flat bed without using bedrails?: A Little Help needed moving from lying on your back to sitting on the side of a flat bed without using bedrails?: A Little Help needed moving to and from a bed to a chair (including a wheelchair)?: A Little   Help needed to walk in hospital room?: A Lot Help needed climbing 3-5 steps with a railing? : Total 6 Click Score: 12    End of Session Equipment Utilized During Treatment: Gait belt Activity Tolerance: Patient tolerated treatment well;Patient limited by fatigue Patient left: in chair;with call bell/phone within reach;with chair alarm set Nurse Communication: Mobility status(mobility sheet left in room; with MD during session) PT Visit Diagnosis: Muscle weakness (generalized) (M62.81);Difficulty in walking, not elsewhere classified (R26.2);Unsteadiness on feet (R26.81)    Time: 0823-0902 PT Time Calculation (min) (ACUTE ONLY): 39 min   Charges:   PT Evaluation $PT Eval Low Complexity: 1 Low PT Treatments $Therapeutic Activity: 8-22 mins          Geraldine Solar PT, DPT

## 2018-05-12 NOTE — Care Management (Signed)
CM received authorization from [place]. Auth # [id number]. Good for 48 hours, will need clinical update on 05/14/2018 sent Att: Threasa Beards to [phone]. (ph) [phone] ext. [phone].

## 2018-05-12 NOTE — Evaluation (Signed)
Occupational Therapy Evaluation Patient Details Name: Anthony Dunlap MRN: [id number] DOB: [birth year] Today's Date: 05/12/2018    History of Present Illness 86 y.o. male with PMHx of DM2, HLD and Hypothyroidism as well as chronic Anemia and chronic urinary Retention with chronic indwelling Foley catheter in the setting of prior history of prostate cancer admitted on 05/08/2018 with presumed UTI after presenting to the ED twice within a 24-hour period with urinary and Foley catheter complaints, now found to have fungemia with Candida albicans..   Clinical Impression   Pt in bed eating breakfast upon therapy arrival. Agreeable to participate in OT evaluation. Pt requires increased assistance to complete ADL tasks due to UB weakness and decreased endurance and activity tolerance. At the level he is performing, he is unsafe to return home. Recommend SNF at discharge. Patient is reluctant to go. Education complete from PT, OT, and MD regarding the reason for additional rehab services before returning home. Pt verbalizes understanding.     Follow Up Recommendations  SNF    Equipment Recommendations  None recommended by OT       Precautions / Restrictions Precautions Precautions: Fall Restrictions Weight Bearing Restrictions: No      Mobility Bed Mobility Overal bed mobility: Needs Assistance Bed Mobility: Supine to Sit     Supine to sit: Min assist     General bed mobility comments: Once seated on EOB, patient had increased difficulty maintaining his static sitting balance and required min-mod physical assistance and VC to remain upright. Able to maintain position for a short amount of time before requiring physical assistance.   Transfers Overall transfer level: Needs assistance Equipment used: Rolling walker (2 wheeled) Transfers: Sit to/from [facility] Sit to Stand: Min assist;Mod assist Stand pivot transfers: Min assist       General transfer comment: from  elevated bed, toilet, and SPT to chair with RW; cues for safety throughout        ADL either performed or assessed with clinical judgement   ADL Overall ADL's : Needs assistance/impaired Eating/Feeding: Modified independent;Bed level   Grooming: Wash/dry hands;Minimal assistance;Standing                   Toilet Transfer: Minimal assistance;BSC;RW;Ambulation;Cueing for sequencing;Cueing for safety   Toileting- Clothing Manipulation and Hygiene: Sit to/from stand;Minimal assistance Toileting - Clothing Manipulation Details (indicate cue type and reason): Able to wipe when provided with toilet paper. No clothing to manipulate.      Functional mobility during ADLs: Minimal assistance;Cueing for safety;Rolling walker       Vision Baseline Vision/History: No visual deficits Patient Visual Report: No change from baseline              Pertinent Vitals/Pain Pain Assessment: No/denies pain     Hand Dominance Right   Extremity/Trunk Assessment Upper Extremity Assessment Upper Extremity Assessment: Generalized weakness   Lower Extremity Assessment Lower Extremity Assessment: Generalized weakness;Defer to PT evaluation   Cervical / Trunk Assessment Cervical / Trunk Assessment: Kyphotic   Communication Communication Communication: No difficulties   Cognition Arousal/Alertness: Awake/alert Behavior During Therapy: WFL for tasks assessed/performed Overall Cognitive Status: No family/caregiver present to determine baseline cognitive functioning           General Comments: no alert to the year (thought it was 2001 or 2002) but able to name the month. Pt with increased difficulty with short memory during Physician teach back method when discussing care.  Home Living Family/patient expects to be discharged to:: Private residence Living Arrangements: Spouse/significant other Available Help at Discharge: Family Type of Home: House Home Access: [place]: One level     Bathroom Shower/Tub: Occupational psychologist: [place] Accessibility: Yes   Home Equipment: Bedside commode;Shower seat;Walker - 2 wheels;Cane - single point   Additional Comments: pt's wife has Alzheimers, kids help out but unsure if they live with them      Prior Functioning/Environment Level of Independence: Independent with assistive device(s)        Comments: pt reports independence with ADLs, IADLs, and states he was still driving; states if he was outdoors he would use a cane or RW and reports that he could walk unlimted community distance.        OT Problem List: Decreased strength;Decreased knowledge of use of DME or AE;Decreased coordination;Decreased activity tolerance;Impaired UE functional use;Impaired balance (sitting and/or standing)      OT Treatment/Interventions: Self-care/ADL training;Balance training;Therapeutic exercise;Neuromuscular education;Therapeutic activities;Energy conservation;DME and/or AE instruction;Patient/family education    OT Goals(Current goals can be found in the care plan section) Acute Rehab OT Goals Patient Stated Goal: To go home OT Goal Formulation: With patient Time For Goal Achievement: 05/26/18 Potential to Achieve Goals: Good  OT Frequency: Min 2X/week   Barriers to D/C: Decreased caregiver support  Pt requires increased physical assistance and unsure if wife is able to provide this.        Co-evaluation PT/OT/SLP Co-Evaluation/Treatment: Yes Reason for Co-Treatment: To address functional/ADL transfers   OT goals addressed during session: ADL's and self-care;Strengthening/ROM;Proper use of Adaptive equipment and DME      AM-PAC OT "6 Clicks" Daily Activity     Outcome Measure Help from another person eating meals?: None Help from another person taking care of personal grooming?: A Little Help from another person toileting, which includes using toliet, bedpan,  or urinal?: A Lot Help from another person bathing (including washing, rinsing, drying)?: A Lot Help from another person to put on and taking off regular upper body clothing?: A Lot Help from another person to put on and taking off regular lower body clothing?: Total 6 Click Score: 14   End of Session Equipment Utilized During Treatment: Gait belt;Rolling walker  Activity Tolerance: Patient tolerated treatment well Patient left: in chair;with call bell/phone within reach;with chair alarm set  OT Visit Diagnosis: Muscle weakness (generalized) (M62.81)                Time: 7673-4193 OT Time Calculation (min): 28 min Charges:  OT General Charges $OT Visit: 1 Visit OT Evaluation $OT Eval Moderate Complexity: 1 [address] OTR/L,CBIS  [phone]   Kharter Brew, Clarene Duke 05/12/2018, 9:10 AM

## 2018-05-12 NOTE — Progress Notes (Signed)
Inpatient Diabetes Program Recommendations  AACE/ADA: New Consensus Statement on Inpatient Glycemic Control (2015)  Target Ranges:  Prepandial:   less than 140 mg/dL      Peak postprandial:   less than 180 mg/dL (1-2 hours)      Critically ill patients:  140 - 180 mg/dL   Lab Results  Component Value Date   GLUCAP 125 (H) 05/12/2018   HGBA1C 9.6 (H) 01/04/2018    Review of Glycemic Control Results for KEY, CEN (MRN [id number]) as of 05/12/2018 10:51  Ref. Range 05/11/2018 11:47 05/11/2018 16:52 05/11/2018 18:04 05/11/2018 21:22 05/12/2018 07:29  Glucose-Capillary Latest Ref Range: 70 - 99 mg/dL 159 (H) 66 (L) 89 79 125 (H)    Inpatient Diabetes Program Recommendations:   -Decrease Amaryl on discharge to 1-2 mg -Hold oral DM medications while in the hospital  Thank you, Nani Gasser. Zanai Mallari, RN, MSN, CDE  Diabetes Coordinator Inpatient Glycemic Control Team Team Pager [phone] (8am-5pm) 05/12/2018 10:53 AM

## 2018-05-12 NOTE — Progress Notes (Signed)
Patient Demographics:    Anthony Dunlap, is a 85 y.o. male, DOB - 1933-11-18, HQP:[id number]  Admit date - 05/08/2018   Admitting Physician Joelyn Lover Denton Brick, MD  Outpatient Primary MD for the patient is [name], MD  LOS - 4  Chief Complaint  Patient presents with  . Fever        Subjective:    Anthony Dunlap today has no fevers, no emesis,  No chest pain, no further significant hypoglycemic episodes, continues to deny any visual disturbance,   No fevers, no vomiting    Assessment  & Plan :    Principal Problem:   Fungemia/Candida albicans Active Problems:   Type 2 diabetes mellitus ([name])   Chronic anemia   Chronic indwelling Foley catheter   Hypothyroidism   UTI (urinary tract infection)   Complicated UTI (urinary tract infection)  Brief Summary:- 85 y.o. male with PMHx of DM2, HLD and Hypothyroidism as well as chronic Anemia and chronic urinary Retention with chronic indwelling Foley catheter in the setting of prior history of prostate cancer admitted on 05/08/2018 with presumed UTI after presenting to the ED twice within a 24-hour period with urinary and Foley catheter complaints, now found to have fungemia with Candida albicans... Phone consult with infectious disease specialist [name] on 05/10/2018 and 05/11/2018   Plan:- 1)Sepsis secondary to Fungemia due to Candida Albicans--- afebrile, blood cultures from 05/08/18  with Candida albicans, discussed with infectious disease specialist [name]... Initially treated with high-dose IV fluconazole per protocol, however given elevated LFTs d/w Dr Johnnye Sima and Pharmacist on 05/12/18 ...will stop Diflucan and start Eraxis.  repeat blood cultures 05/10/2018 negative to date... Patient will need 2 weeks of antifungal therapy from  for about 2 weeks from first negative culture (05/10/18)... Patient denies any visual concerns at this time patient will  need ophthalmology evaluation as outpatient given fungemia  2)SIRS secondary to fungemia/candidemia ---POA--patient is afebrile, , WBC is back up to 14 from 9.7,  overall hemodynamically stable at this time, patient with chronically indwelling Foley catheter last changed 2 weeks PTA, Foley changed again on 05/08/2018, STOP  IV Rocephin on 05/11/18 as  blood and urine cultures are without bacterial growth,   3)Candidal intertrigo -use topical nystatin cream to groin/scrotum/perineum--- patient now has Candida Albicans in his blood, see #1 above  4 Unspecified/unknown etiology Chronic Anemia--- ??? Etiology, stable with hemoglobin around 10 which is close to patient's baseline,    continue iron supplementation  5)Hypothyroidism--- patient's TSH was 6.3 on February 11, 2018, continue levothyroxine 100 mcg dialy  6)HLD--stable, continue Pravastatin 10 mg daily with aspirin 81 mg daily  7)DM2--no frank DKA, as A1c was 9.6 in November 2019, reflecting poor diabetic control.  No further episodes significant hypoglycemia , continue Levemir insulin at decreased dose of  6 units from 18 units,   continue Amaryl 4 mg with breakfast, use Novolog/Humalog Sliding scale insulin with Accu-Cheks/Fingersticks as ordered  8)Elevated LFTs----likely due to high-dose Diflucan.... given elevated LFTs d/w Dr Johnnye Sima and Pharmacist on 05/12/18 ...will stop Diflucan and start Eraxis... AST is up to 164 from 50 alkaline phosphatase is up to 1 from 121, total bili is up to 2.5 from 0.6, ALT is up to 164 from 30----avoid hepatotoxic agents.... Follow  serial CMP's  9) generalized  Weakness/debility--- patient with unsteady gait... PT eval appreciated patient will need SNF rehab prior to returning home   Disposition/Need for in-Hospital Stay- patient unable to be discharged at this time due to fungemia requiring IV  Eraxis, and elevated LFTs also awaiting Repeat blood culture results , patient previously failed oral  antibiotics x2 as outpatient PTA  Code Status : full  Family Communication:   Daughter by Phone  Disposition Plan  : home  Consults  : Phone consult with infectious disease specialist [name] on 05/10/2018 and 05/11/2018, Dr Johnnye Sima on 05/12/18  DVT Prophylaxis  :    - Heparin -  Lab Results  Component Value Date   PLT 223 05/12/2018    Inpatient Medications  Scheduled Meds: . aspirin EC  81 mg Oral Daily  . ferrous sulfate  325 mg Oral Q breakfast  . glimepiride  4 mg Oral Daily  . heparin  5,000 Units Subcutaneous Q8H  . insulin aspart  0-5 Units Subcutaneous QHS  . insulin aspart  0-9 Units Subcutaneous TID WC  . insulin detemir  6 Units Subcutaneous Daily  . levothyroxine  100 mcg Oral Daily  . magnesium oxide  400 mg Oral Daily  . multivitamin with minerals  1 tablet Oral Daily  . nutrition supplement (JUVEN)  1 packet Oral BID BM  . nystatin   Topical BID  . pravastatin  10 mg Oral q1800  . sodium chloride flush  3 mL Intravenous Once  . sodium chloride flush  3 mL Intravenous Q12H   Continuous Infusions: . sodium chloride    . 0.9 % NaCl with KCl 20 mEq / L 50 mL/hr at 05/11/18 1012  . [START ON 05/13/2018] anidulafungin     PRN Meds:.sodium chloride, acetaminophen **OR** acetaminophen, albuterol, guaiFENesin-dextromethorphan, ondansetron **OR** ondansetron (ZOFRAN) IV, polyethylene glycol, sodium chloride flush, traZODone    Anti-infectives (From admission, onward)   Start     Dose/Rate Route Frequency Ordered Stop   05/13/18 1200  anidulafungin (ERAXIS) 200 mg in sodium chloride 0.9 % 200 mL IVPB  Status:  Discontinued     200 mg 78 mL/hr over 200 Minutes Intravenous Every 24 hours 05/12/18 1054 05/12/18 1054   05/13/18 1200  anidulafungin (ERAXIS) 100 mg in sodium chloride 0.9 % 100 mL IVPB     100 mg 78 mL/hr over 100 Minutes Intravenous Every 24 hours 05/12/18 1054 05/24/18 1159   05/12/18 1200  anidulafungin (ERAXIS) 200 mg in sodium chloride 0.9 % 200  mL IVPB  Status:  Discontinued     200 mg 78 mL/hr over 200 Minutes Intravenous  Once 05/12/18 1054 05/12/18 1054   05/12/18 1200  anidulafungin (ERAXIS) 200 mg in sodium chloride 0.9 % 200 mL IVPB     200 mg 78 mL/hr over 200 Minutes Intravenous  Once 05/12/18 1054 05/12/18 1615   05/11/18 1000  fluconazole (DIFLUCAN) IVPB 400 mg  Status:  Discontinued     400 mg 100 mL/hr over 120 Minutes Intravenous Every 24 hours 05/10/18 0925 05/12/18 1044   05/11/18 0000  anidulafungin (ERAXIS) 100 mg in sodium chloride 0.9 % 100 mL IVPB  Status:  Discontinued     100 mg 78 mL/hr over 100 Minutes Intravenous Every 24 hours 05/10/18 0911 05/10/18 0924   05/10/18 1230  fluconazole (DIFLUCAN) IVPB 400 mg     400 mg 100 mL/hr over 120 Minutes Intravenous  Once 05/10/18 0945 05/10/18 2054   05/10/18 1130  fluconazole (  DIFLUCAN) IVPB 400 mg  Status:  Discontinued     400 mg 100 mL/hr over 120 Minutes Intravenous  Once 05/10/18 0940 05/10/18 0943   05/10/18 1030  fluconazole (DIFLUCAN) IVPB 800 mg  Status:  Discontinued     800 mg 200 mL/hr over 120 Minutes Intravenous  Once 05/10/18 0925 05/10/18 0937   05/10/18 1030  fluconazole (DIFLUCAN) IVPB 400 mg  Status:  Discontinued     400 mg 100 mL/hr over 120 Minutes Intravenous  Once 05/10/18 0940 05/10/18 0943   05/10/18 1030  fluconazole (DIFLUCAN) IVPB 400 mg     400 mg 100 mL/hr over 120 Minutes Intravenous  Once 05/10/18 0945 05/10/18 1644   05/10/18 0915  anidulafungin (ERAXIS) 200 mg in sodium chloride 0.9 % 200 mL IVPB  Status:  Discontinued     200 mg 78 mL/hr over 200 Minutes Intravenous  Once 05/10/18 0911 05/10/18 0925   05/09/18 1200  cefTRIAXone (ROCEPHIN) 1 g in sodium chloride 0.9 % 100 mL IVPB  Status:  Discontinued     1 g 200 mL/hr over 30 Minutes Intravenous Every 24 hours 05/08/18 1643 05/10/18 1730   05/08/18 1800  fluconazole (DIFLUCAN) IVPB 200 mg  Status:  Discontinued     200 mg 100 mL/hr over 60 Minutes Intravenous Every 24  hours 05/08/18 1750 05/10/18 0911   05/08/18 1400  fluconazole (DIFLUCAN) IVPB 200 mg  Status:  Discontinued     200 mg 100 mL/hr over 60 Minutes Intravenous Every 24 hours 05/08/18 1356 05/08/18 1750   05/08/18 1145  cefTRIAXone (ROCEPHIN) 1 g in sodium chloride 0.9 % 100 mL IVPB     1 g 200 mL/hr over 30 Minutes Intravenous  Once 05/08/18 1136 05/08/18 1235        Objective:   Vitals:   05/11/18 2142 05/12/18 0640 05/12/18 0737 05/12/18 1418  BP: 140/80 125/78  126/80  Pulse: (!) 103 (!) 108  95  Resp: 18 19  18   Temp: 98.5 F (36.9 C) 99.8 F (37.7 C)  97.9 F (36.6 C)  TempSrc: Oral Oral  Oral  SpO2: 100% 98% 97% 100%  Weight:      Height:        Wt Readings from Last 3 Encounters:  05/08/18 68.7 kg  05/08/18 67.5 kg  04/21/18 67.6 kg     Intake/Output Summary (Last 24 hours) at 05/12/2018 1808 Last data filed at 05/12/2018 1500 Gross per 24 hour  Intake 1982.86 ml  Output 3752 ml  Net -1769.14 ml   Physical Exam Patient is examined daily including today on 05/12/18 , exams remain the same as of yesterday except that has changed   Gen:- Awake Alert,  In no apparent distress  HEENT:- [place].AT, No sclera icterus, no visual complaints Neck-Supple Neck,No JVD,.  Lungs-  CTAB , fair symmetrical air movement CV- S1, S2 normal, regular  Abd-  +ve B.Sounds, Abd Soft, No tenderness, no CVA area tenderness Extremity:- No  edema, pedal pulses present  Psych-affect is appropriate, oriented x3 Neuro-generalized weakness and gait disturbance but no new focal deficits, no tremors Skin -groin/scrotum/perineum with erythematous rash with satellite lesions consistent with candidal intertrigo GU--- Foley catheter with clear urine    Data Review:   Micro Results Recent Results (from the past 240 hour(s))  Urine culture     Status: None   Collection Time: 05/08/18  5:12 AM  Result Value Ref Range Status   Specimen Description   Final    URINE,  CLEAN CATCH Performed at  [facility], [address] [place], [place] [address]    Special Requests   Final    NONE Performed at [facility], [address], [place], [place] [address]    Culture   Final    NO GROWTH Performed at [facility], [place] [address] [place], [place] [address]    Report Status 05/09/2018 FINAL  Final  Urine Culture     Status: None   Collection Time: 05/08/18 11:22 AM  Result Value Ref Range Status   Specimen Description   Final    URINE, RANDOM Performed at [facility], [address] [place], [place] [address]    Special Requests   Final    NONE Performed at [facility], [address] [place], [place] [address]    Culture   Final    Multiple bacterial morphotypes present, none predominant. Suggest appropriate recollection if clinically indicated.   Report Status 05/09/2018 FINAL  Final  Culture, blood (Routine x 2)     Status: None (Preliminary result)   Collection Time: 05/08/18 11:52 AM  Result Value Ref Range Status   Specimen Description BLOOD  Final   Special Requests NONE  Final   Culture   Final    NO GROWTH 4 DAYS Performed at [facility], [address], [place], [place] [address]    Report Status PENDING  Incomplete  Culture, blood (Routine x 2)     Status: Abnormal   Collection Time: 05/08/18 11:52 AM  Result Value Ref Range Status   Specimen Description   Final    BLOOD RIGHT ARM Performed at [facility], [address] [place], [place] [address]    Special Requests   Final    BOTTLES DRAWN AEROBIC AND ANAEROBIC Blood Culture adequate volume Performed at [facility], [address], [place], [place] [address]    Culture  Setup Time   Final    YEAST Gram Stain Report Called to,Read Back By and Verified With: CULLEN,M. AT 0128 ON 05/10/2018 BY EVA AEROBIC BOTTLE ONLY Performed at [place], READ BACK BY AND VERIFIED WITH: [place] Planas [id number] FCP Performed at [facility], [place] [address] [place], [place] [address]    Culture CANDIDA ALBICANS (A)  Final   Report Status 05/12/2018 FINAL  Final  Blood Culture (routine x 2)     Status: Abnormal (Preliminary result)   Collection Time: 05/08/18 11:52 AM  Result Value Ref Range Status   Specimen Description   Final    BLOOD LEFT HAND Performed at [facility], [address], [place], [place] [address]    Special Requests   Final    BOTTLES DRAWN AEROBIC AND ANAEROBIC Blood Culture adequate volume Performed at [facility], [address] [place], [place] [address]    Culture  Setup Time   Final    YEAST Gram Stain Report Called to,Read Back By and Verified With: [place] S. @ [id number] ON [id number] BY HENDERSON L. AEROBIC BOTTLE ONLY    Culture (A)  Final    CANDIDA ALBICANS Sent to [place] for further susceptibility testing. Performed at [facility], [place] [address], [place],  [address]    Report Status PENDING  Incomplete  Blood Culture (routine x 2)     Status: None (Preliminary result)   Collection Time: 05/08/18 11:52 AM  Result Value Ref Range Status   Specimen Description BLOOD  Final  Special Requests NONE  Final   Culture   Final    NO GROWTH 4 DAYS Performed at [facility], [address], [place], [place] [address]    Report Status PENDING  Incomplete  Blood Culture ID Panel (Reflexed)     Status: Abnormal   Collection Time: 05/08/18 11:52 AM  Result Value Ref Range Status   Enterococcus species NOT DETECTED NOT DETECTED Final   Listeria monocytogenes NOT DETECTED NOT DETECTED Final   Staphylococcus species NOT DETECTED NOT DETECTED Final   Staphylococcus aureus (BCID) NOT DETECTED NOT DETECTED Final   Streptococcus species NOT DETECTED NOT DETECTED Final   Streptococcus agalactiae NOT DETECTED NOT DETECTED Final   Streptococcus pneumoniae NOT DETECTED NOT DETECTED Final   Streptococcus pyogenes NOT DETECTED NOT DETECTED Final   Acinetobacter baumannii NOT DETECTED NOT DETECTED Final    Enterobacteriaceae species NOT DETECTED NOT DETECTED Final   Enterobacter cloacae complex NOT DETECTED NOT DETECTED Final   Escherichia coli NOT DETECTED NOT DETECTED Final   Klebsiella oxytoca NOT DETECTED NOT DETECTED Final   Klebsiella pneumoniae NOT DETECTED NOT DETECTED Final   Proteus species NOT DETECTED NOT DETECTED Final   Serratia marcescens NOT DETECTED NOT DETECTED Final   Haemophilus influenzae NOT DETECTED NOT DETECTED Final   Neisseria meningitidis NOT DETECTED NOT DETECTED Final   Pseudomonas aeruginosa NOT DETECTED NOT DETECTED Final   Candida albicans DETECTED (A) NOT DETECTED Final    Comment: CRITICAL RESULT CALLED TO, READ BACK BY AND VERIFIED WITH: PHARMD SCOTT HALL [id number] FCP    Candida glabrata NOT DETECTED NOT DETECTED Final   Candida krusei NOT DETECTED NOT DETECTED Final   Candida parapsilosis NOT DETECTED NOT DETECTED Final   Candida tropicalis NOT DETECTED NOT DETECTED Final    Comment: Performed at [facility], [address] [place], [place] [address]  Urine Culture     Status: None   Collection Time: 05/08/18  2:40 PM  Result Value Ref Range Status   Specimen Description   Final    URINE, CATHETERIZED Performed at [facility], [address] [place], [place] [address]    Special Requests   Final    Normal Performed at [facility], [address], [place], [place] [address]    Culture   Final    NO GROWTH Performed at [facility], [place] [address], [place], [place] [address]    Report Status 05/09/2018 FINAL  Final  MRSA PCR Screening     Status: None   Collection Time: 05/09/18  8:55 AM  Result Value Ref Range Status   MRSA by PCR NEGATIVE NEGATIVE Final    Comment:        The GeneXpert MRSA Assay (FDA approved for NASAL specimens only), is one component of a comprehensive MRSA colonization surveillance program. It is not intended to diagnose MRSA infection nor to guide or monitor treatment for MRSA  infections. Performed at [facility], [address] [place], [place] [address]   Culture, blood (Routine X 2) w Reflex to ID Panel     Status: None (Preliminary result)   Collection Time: 05/10/18  9:26 AM  Result Value Ref Range Status   Specimen Description   Final    BLOOD LEFT HAND BOTTLES DRAWN AEROBIC AND ANAEROBIC   Special Requests Blood Culture adequate volume  Final   Culture   Final    NO GROWTH 2 DAYS Performed at [facility], [address] [place],  [address]  Report Status PENDING  Incomplete  Culture, blood (Routine X 2) w Reflex to ID Panel     Status: None (Preliminary result)   Collection Time: 05/10/18  9:28 AM  Result Value Ref Range Status   Specimen Description   Final    BLOOD RIGHT HAND BOTTLES DRAWN AEROBIC AND ANAEROBIC   Special Requests Blood Culture adequate volume  Final   Culture   Final    NO GROWTH 2 DAYS Performed at [facility], [address], [place], [place] [address]    Report Status PENDING  Incomplete    Radiology Reports Dg Chest 2 View  Result Date: 05/08/2018 CLINICAL DATA:  85 year old male with a history of nausea and shaking EXAM: CHEST - 2 VIEW COMPARISON:  01/03/2018 FINDINGS: Cardiomediastinal silhouette unchanged in size and contour. No evidence of central vascular congestion. No interlobular septal thickening. No pneumothorax or pleural effusion. Coarsened interstitial markings. No confluent airspace disease. No displaced fracture. IMPRESSION: Negative for acute cardiopulmonary disease, with background of chronic lung changes. Electronically Signed   By: Corrie Mckusick D.O.   On: 05/08/2018 12:16     CBC Recent Labs  Lab 05/08/18 1151 05/08/18 1152 05/09/18 0549 05/10/18 0605 05/12/18 0431  WBC 12.2* 12.2* 17.4* 9.7 14.0*  HGB 9.4* 9.5* 10.0* 9.6* 10.0*  HCT 30.1* 30.2* 32.1* 30.9* 32.6*  PLT 354 357 307 215 223  MCV 82.0 81.8 82.1 81.7 80.9  MCH 25.6* 25.7* 25.6* 25.4* 24.8*  MCHC 31.2 31.5 31.2 31.1 30.7   RDW 14.4 14.3 14.7 14.7 14.4  LYMPHSABS 0.1* 0.1*  --   --   --   MONOABS 0.3 0.3  --   --   --   EOSABS 0.0 0.0  --   --   --   BASOSABS 0.0 0.0  --   --   --     Chemistries  Recent Labs  Lab 05/08/18 1151 05/08/18 1152 05/09/18 0549 05/10/18 0605 05/12/18 0431  NA 134* 133* 133* 132* 136  K 3.7 3.6 2.9* 3.6 2.9*  CL 105 101 103 103 104  CO2 20* 20* 19* 20* 24  GLUCOSE 175* 177* 101* 66* 110*  BUN 20 20 16  26* 19  CREATININE 0.85 0.81 0.91 0.97 0.81  CALCIUM 8.3* 8.3* 7.8* 8.0* 8.5*  MG  --   --   --  1.6*  --   AST 51* 50*  --   --  164*  ALT 29 30  --   --  164*  ALKPHOS 122 121  --   --  521*  BILITOT 0.4 0.6  --   --  2.5*   ------------------------------------------------------------------------------------------------------------------ No results for input(s): CHOL, HDL, LDLCALC, TRIG, CHOLHDL, LDLDIRECT in the last 72 hours.  Lab Results  Component Value Date   HGBA1C 9.6 (H) 01/04/2018   ------------------------------------------------------------------------------------------------------------------ No results for input(s): TSH, T4TOTAL, T3FREE, THYROIDAB in the last 72 hours.  Invalid input(s): FREET3 ------------------------------------------------------------------------------------------------------------------ No results for input(s): VITAMINB12, FOLATE, FERRITIN, TIBC, IRON, RETICCTPCT in the last 72 hours.  Coagulation profile Recent Labs  Lab 05/08/18 1151  INR 1.1    No results for input(s): DDIMER in the last 72 hours.  Cardiac Enzymes No results for input(s): CKMB, TROPONINI, MYOGLOBIN in the last 168 hours.  Invalid input(s): CK ------------------------------------------------------------------------------------------------------------------ No results found for: BNP   Roxan Hockey M.D on 05/12/2018 at 6:08 PM  Go to [email or web address] - for contact info  [facility] - Office  [phone]

## 2018-05-12 NOTE — [place] FL2 (Signed)
[place] MEDICAID FL2 LEVEL OF CARE SCREENING TOOL     IDENTIFICATION  Patient Name: Anthony Dunlap Birthdate: 1933/06/02 Sex: male Admission Date (Current Location): 05/08/2018  [facility] and [place] Number:  [employer] and Address:  [place] [address] [place]      Provider Number: [id number]  Attending Physician Name and Address:  Roxan Hockey, MD  Relative Name and Phone Number:  Mancel Bale [phone]    Current Level of Care: Hospital Recommended Level of Care: [place] Prior Approval Number:    Date Approved/Denied:   PASRR Number: [id number]  Discharge Plan: SNF    Current Diagnoses: Patient Active Problem List   Diagnosis Date Noted  . Fungemia/Candida albicans 05/10/2018  . UTI (urinary tract infection) 05/08/2018  . Complicated UTI (urinary tract infection) 05/08/2018  . 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 Abscess of left thigh   . Pressure injury of skin 02/10/2018  . Enterococcus faecalis infection 01/27/2018  . Abscess   . 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 Sepsis due to Bacteroides species ([place]) 01/22/2018  . Sepsis without acute organ dysfunction ([place])   . DKA (diabetic ketoacidoses) ([place]) 01/17/2018  . Normocytic anemia 01/17/2018  . Chronic anemia 01/03/2018  . Chronic indwelling Foley catheter 01/03/2018  . Elevated alkaline phosphatase level 01/03/2018  . Hypothyroidism 01/03/2018  . Physical deconditioning 01/03/2018  . 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 HLD (hyperlipidemia) 08/19/2013  . Type 2 diabetes mellitus ([place])   . Prostate cancer ([place])     Orientation RESPIRATION BLADDER Height & Weight     Self, Time, Situation, Place  Normal Indwelling catheter(Chronic Catheture) Weight: 68.7 kg Height:  5\' 9"  (175.3 cm)  BEHAVIORAL SYMPTOMS/MOOD NEUROLOGICAL BOWEL NUTRITION STATUS      Continent Diet(heart healthy, carb  modified)  AMBULATORY STATUS COMMUNICATION OF NEEDS Skin   Extensive Assist Verbally Other (Comment)(fungal rash to peineum, scrotum, groin)                       Personal Care Assistance Level of Assistance  Bathing, Feeding, Dressing Bathing Assistance: Limited assistance Feeding assistance: Independent Dressing Assistance: Limited assistance     Functional Limitations Info  Sight, Hearing, Speech Sight Info: Adequate Hearing Info: Adequate Speech Info: Adequate    SPECIAL CARE FACTORS FREQUENCY  PT (By licensed PT), OT (By licensed OT)     PT Frequency: 5 days/week OT Frequency: 5 days/week            Contractures Contractures Info: Not present    Additional Factors Info  Code Status, Allergies, Insulin Sliding Scale Code Status Info: full Allergies Info: no known allergies   Insulin Sliding Scale Info: see DC summary       Current Medications (05/12/2018):  This is the current hospital active medication list Current Facility-Administered Medications  Medication Dose Route Frequency Provider Last Rate Last Dose  . 0.9 %  sodium chloride infusion  250 mL Intravenous PRN Emokpae, Courage, MD      . 0.9 % NaCl with KCl 20 mEq/ L  infusion   Intravenous Continuous Emokpae, Courage, MD 50 mL/hr at 05/11/18 1012    . acetaminophen (TYLENOL) tablet 650 mg  650 mg Oral Q6H PRN Emokpae, Courage, MD       Or  . acetaminophen (TYLENOL) suppository 650 mg  650 mg Rectal Q6H PRN Roxan Hockey, MD      .  albuterol (PROVENTIL) (2.5 MG/3ML) 0.083% nebulizer solution 2.5 mg  2.5 mg Nebulization Q2H PRN Emokpae, Courage, MD      . aspirin EC tablet 81 mg  81 mg Oral Daily Emokpae, Courage, MD   81 mg at 05/12/18 0828  . ferrous sulfate tablet 325 mg  325 mg Oral Q breakfast Denton Brick, Courage, MD   325 mg at 05/12/18 0747  . fluconazole (DIFLUCAN) IVPB 400 mg  400 mg Intravenous Q24H Tommy Medal, Lavell Islam, MD 100 mL/hr at 05/11/18 1303 400 mg at 05/11/18 1303  . glimepiride  (AMARYL) tablet 4 mg  4 mg Oral Daily Emokpae, Courage, MD   4 mg at 05/12/18 0828  . guaiFENesin-dextromethorphan (ROBITUSSIN DM) 100-10 MG/5ML syrup 5 mL  5 mL Oral Q4H PRN Emokpae, Courage, MD   5 mL at 05/12/18 0016  . heparin injection 5,000 Units  5,000 Units Subcutaneous Q8H Roxan Hockey, MD   5,000 Units at 05/12/18 0557  . insulin aspart (novoLOG) injection 0-5 Units  0-5 Units Subcutaneous QHS Emokpae, Courage, MD      . insulin aspart (novoLOG) injection 0-9 Units  0-9 Units Subcutaneous TID WC Roxan Hockey, MD   1 Units at 05/12/18 0748  . insulin detemir (LEVEMIR) injection 6 Units  6 Units Subcutaneous Daily Roxan Hockey, MD   6 Units at 05/11/18 1015  . levothyroxine (SYNTHROID, LEVOTHROID) tablet 100 mcg  100 mcg Oral Daily Denton Brick, Courage, MD   100 mcg at 05/12/18 0557  . magnesium oxide (MAG-OX) tablet 400 mg  400 mg Oral Daily Emokpae, Courage, MD   400 mg at 05/12/18 0828  . multivitamin with minerals tablet 1 tablet  1 tablet Oral Daily Roxan Hockey, MD   1 tablet at 05/12/18 3086  . nutrition supplement (JUVEN) (JUVEN) powder packet 1 packet  1 packet Oral BID BM Denton Brick, Courage, MD   1 packet at 05/12/18 0827  . nystatin (MYCOSTATIN/NYSTOP) topical powder   Topical BID Emokpae, Courage, MD      . ondansetron (ZOFRAN) tablet 4 mg  4 mg Oral Q6H PRN Emokpae, Courage, MD       Or  . ondansetron (ZOFRAN) injection 4 mg  4 mg Intravenous Q6H PRN Emokpae, Courage, MD      . polyethylene glycol (MIRALAX / GLYCOLAX) packet 17 g  17 g Oral Daily PRN Emokpae, Courage, MD      . potassium chloride SA (K-DUR,KLOR-CON) CR tablet 40 mEq  40 mEq Oral Once Emokpae, Courage, MD      . potassium chloride SA (K-DUR,KLOR-CON) CR tablet 40 mEq  40 mEq Oral Once Emokpae, Courage, MD      . pravastatin (PRAVACHOL) tablet 10 mg  10 mg Oral q1800 Emokpae, Courage, MD   10 mg at 05/11/18 1824  . sodium chloride flush (NS) 0.9 % injection 3 mL  3 mL Intravenous Once Emokpae, Courage,  MD      . sodium chloride flush (NS) 0.9 % injection 3 mL  3 mL Intravenous Q12H Emokpae, Courage, MD   3 mL at 05/10/18 2247  . sodium chloride flush (NS) 0.9 % injection 3 mL  3 mL Intravenous PRN Emokpae, Courage, MD      . traZODone (DESYREL) tablet 50 mg  50 mg Oral QHS PRN Roxan Hockey, MD   50 mg at 05/12/18 0016     Discharge Medications: Please see discharge summary for a list of discharge medications.  Relevant Imaging Results:  Relevant Lab Results:   Additional Information [phone]  [id number]; IV Fluconazole x 2 weeks  Roda Shutters Margretta Sidle, RN

## 2018-05-13 ENCOUNTER — Inpatient Hospital Stay (HOSPITAL_COMMUNITY): Payer: Medicare Other

## 2018-05-13 LAB — COMPREHENSIVE METABOLIC PANEL
ALT: 128 U/L — ABNORMAL HIGH (ref 0–44)
AST: 103 U/L — AB (ref 15–41)
Albumin: 2.2 g/dL — ABNORMAL LOW (ref 3.5–5.0)
Alkaline Phosphatase: 534 U/L — ABNORMAL HIGH (ref 38–126)
Anion gap: 9 (ref 5–15)
BUN: 30 mg/dL — AB (ref 8–23)
CO2: 20 mmol/L — ABNORMAL LOW (ref 22–32)
Calcium: 8.4 mg/dL — ABNORMAL LOW (ref 8.9–10.3)
Chloride: 107 mmol/L (ref 98–111)
Creatinine, Ser: 1.08 mg/dL (ref 0.61–1.24)
GFR calc Af Amer: >60 mL/min (ref >60)
GFR calc non Af Amer: >60 mL/min (ref >60)
Glucose, Bld: 124 mg/dL — ABNORMAL HIGH (ref 70–99)
Potassium: 3.7 mmol/L (ref 3.5–5.1)
Sodium: 136 mmol/L (ref 135–145)
Total Bilirubin: 2.7 mg/dL — ABNORMAL HIGH (ref 0.3–1.2)
Total Protein: 6.2 g/dL — ABNORMAL LOW (ref 6.5–8.1)

## 2018-05-13 LAB — CBC
HCT: 32.4 % — ABNORMAL LOW (ref 39.0–52.0)
Hemoglobin: 10 g/dL — ABNORMAL LOW (ref 13.0–17.0)
MCH: 25.3 pg — AB (ref 26.0–34.0)
MCHC: 30.9 g/dL (ref 30.0–36.0)
MCV: 81.8 fL (ref 80.0–100.0)
Platelets: 234 10*3/uL (ref 150–400)
RBC: 3.96 MIL/uL — ABNORMAL LOW (ref 4.22–5.81)
RDW: 14.7 % (ref 11.5–15.5)
WBC: 16.7 10*3/uL — ABNORMAL HIGH (ref 4.0–10.5)
nRBC: 0 % (ref 0.0–0.2)

## 2018-05-13 LAB — CULTURE, BLOOD (ROUTINE X 2)
Culture: NO GROWTH
Culture: NO GROWTH

## 2018-05-13 LAB — LIPASE, BLOOD: Lipase: 19 U/L (ref 11–51)

## 2018-05-13 LAB — GLUCOSE, CAPILLARY
Glucose-Capillary: 137 mg/dL — ABNORMAL HIGH (ref 70–99)
Glucose-Capillary: 278 mg/dL — ABNORMAL HIGH (ref 70–99)
Glucose-Capillary: 283 mg/dL — ABNORMAL HIGH (ref 70–99)
Glucose-Capillary: 220 mg/dL — ABNORMAL HIGH (ref 70–99)

## 2018-05-13 LAB — BILIRUBIN, DIRECT: Bilirubin, Direct: 1.9 mg/dL — ABNORMAL HIGH (ref 0.0–0.2)

## 2018-05-13 MED ORDER — SODIUM CHLORIDE 0.9 % IV SOLN: 1.0000 g | INTRAVENOUS | Status: DC

## 2018-05-13 MED ORDER — SODIUM CHLORIDE 0.9 % IV SOLN
2.0000 g | INTRAVENOUS | Status: DC
  Administered 2018-05-14: 2 g via INTRAVENOUS
  Filled 2018-05-13: qty 20

## 2018-05-13 MED ORDER — GADOBUTROL 1 MMOL/ML IV SOLN
6.0000 mL | Freq: Once | INTRAVENOUS | Status: AC | PRN
  Administered 2018-05-13: 6 mL via INTRAVENOUS

## 2018-05-13 NOTE — Progress Notes (Signed)
Physical Therapy Treatment Patient Details Name: Anthony Dunlap MRN: [id number] DOB: 08-08-1933 Today's Date: 05/13/2018    History of Present Illness 85 y.o. male with PMHx of DM2, HLD and Hypothyroidism as well as chronic Anemia and chronic urinary Retention with chronic indwelling Foley catheter in the setting of prior history of prostate cancer admitted on 05/08/2018 with presumed UTI after presenting to the ED twice within a 24-hour period with urinary and Foley catheter complaints, now found to have fungemia with Candida albicans..    PT Comments    PT much improved today.  Therapist did not not any posterior lean during ambulation.  Pt was able to ambulate more than 200 ft with a rolling walker without resting.   Follow Up Recommendations  Home health PT     Equipment Recommendations  None recommended by PT    Recommendations for Other Services  none     Precautions / Restrictions Precautions Precautions: None Restrictions Weight Bearing Restrictions: No    Mobility  Bed Mobility Overal bed mobility: Needs Assistance Bed Mobility: Supine to Sit     Supine to sit: Supervision        Transfers Overall transfer level: Modified independent Equipment used: Rolling walker (2 wheeled) Transfers: Sit to/from [facility] Sit to Stand: Supervision Stand pivot transfers: Supervision       General transfer comment: from elevated bed, toilet, and SPT to chair with RW; cues for safety throughout  Ambulation/Gait Ambulation/Gait assistance: Supervision Gait Distance (Feet): 250 Feet Assistive device: Rolling walker (2 wheeled) Gait Pattern/deviations: Step-to pattern;Decreased stride length;Staggering left;Staggering right Gait velocity: decreased   General Gait Details: Ambulated 2 x around nursing station without having to rest.    Stairs             Wheelchair Mobility    Modified Rankin (Stroke Patients Only)          Cognition  Arousal/Alertness: Awake/alert Behavior During Therapy: WFL for tasks assessed/performed Overall Cognitive Status: No family/caregiver present to determine baseline cognitive functioning                                        Exercises General Exercises - Lower Extremity Ankle Circles/Pumps: Both;10 reps Quad Sets: Both;10 reps Gluteal Sets: Both;10 reps Long Arc Quad: Both;10 reps Heel Slides: Both;10 reps Mini-Sqauts: (bridging x 10)        Pertinent Vitals/Pain Pain Assessment: No/denies pain       Prior Function   I with assistive device         PT Goals (current goals can now be found in the care plan section) Acute Rehab PT Goals Patient Stated Goal: home PT Goal Formulation: With patient Time For Goal Achievement: 05/19/18 Potential to Achieve Goals: Good Progress towards PT goals: Progressing toward goals    Frequency    Min 3X/week      PT Plan Discharge plan needs to be updated       AM-PAC PT "6 Clicks" Mobility   Outcome Measure  Help needed turning from your back to your side while in a flat bed without using bedrails?: A Little Help needed moving from lying on your back to sitting on the side of a flat bed without using bedrails?: A Little Help needed moving to and from a bed to a chair (including a wheelchair)?: A Little Help needed standing up from a chair using your  arms (e.g., wheelchair or bedside chair)?: A Little Help needed to walk in hospital room?: A Little Help needed climbing 3-5 steps with a railing? : A Lot 6 Click Score: 17    End of Session Equipment Utilized During Treatment: Gait belt Activity Tolerance: Patient tolerated treatment well;Patient limited by fatigue Patient left: in chair;with call bell/phone within reach;with chair alarm set;with nursing/sitter in room(nursing students in room ) Nurse Communication: Mobility status(mobility sheet left in room; with MD during session) PT Visit Diagnosis: Muscle  weakness (generalized) (M62.81);Difficulty in walking, not elsewhere classified (R26.2);Unsteadiness on feet (R26.81)     Time: 9480-1655 PT Time Calculation (min) (ACUTE ONLY): 35 min  Charges:  $Gait Training: 8-22 mins $Therapeutic Exercise: 8-22 mins                        Rayetta Humphrey, PT CLT [phone] 05/13/2018, 10:30 AM

## 2018-05-13 NOTE — Progress Notes (Signed)
Patient Demographics:    Anthony Dunlap, is a 85 y.o. male, DOB - 21-Apr-1933, IOM:[id number]  Admit date - 05/08/2018   Admitting Physician Rella Egelston Denton Brick, MD  Outpatient Primary MD for the patient is [name], MD  LOS - 5  Chief Complaint  Patient presents with  . Fever        Subjective:    Anthony Dunlap today has no fevers, no emesis,  No chest pain, no further significant hypoglycemic episodes, continues to deny any visual disturbance, eating ok.....denies abd pain....Marland Kitchenno  Postprandial symptoms either  No fevers, no vomiting    Assessment  & Plan :    Principal Problem:   Fungemia/Candida albicans Active Problems:   Type 2 diabetes mellitus ([name])   Chronic anemia   Chronic indwelling Foley catheter   Hypothyroidism   UTI (urinary tract infection)   Complicated UTI (urinary tract infection)  Brief Summary:- 85 y.o. male with PMHx of DM2, HLD and Hypothyroidism as well as chronic Anemia and chronic urinary Retention with chronic indwelling Foley catheter in the setting of prior history of prostate cancer admitted on 05/08/2018 with presumed UTI after presenting to the ED twice within a 24-hour period with urinary and Foley catheter complaints, now found to have fungemia with Candida albicans... Phone consult with infectious disease specialist [name] on 05/10/2018 and 05/11/2018, phone consult with Dr. Johnnye Sima on 05/12/2018   Plan:- 1)Sepsis secondary to Fungemia due to Candida Albicans--- afebrile, blood cultures from 05/08/18  with Candida albicans, discussed with infectious disease specialist [name]... Initially treated with high-dose IV fluconazole per protocol, however given elevated LFTs d/w Dr Johnnye Sima and Pharmacist on 05/12/18 .Marland KitchenMarland KitchenStopped Diflucan on 05/12/18 and started Eraxis on 05/12/18.  repeat blood cultures 05/10/2018 negative to date... Patient will need 2 weeks of antifungal  therapy from  for about 2 weeks from first negative culture (05/10/18)... Patient denies any visual concerns at this time patient will need ophthalmology evaluation as outpatient given fungemia  2)SIRS secondary to fungemia/candidemia ---POA--patient is afebrile, , WBC is back up to 16.7  from 9.7 after initially dropping with IV Rocephin from 17.4-9.7,,  overall hemodynamically stable at this time, patient with chronically indwelling Foley catheter last changed 2 weeks PTA, Foley changed again on 05/08/2018, restart IV Rocephin on 05/13/18 due to worsening leukocytosis and concerns about possible acute cholecystitis (patient was initially on Rocephin from 05/08/2018 through 05/10/2018)....  3) possible calculus cholecystitis----liver ultrasound cholelithiasis and gallbladder wall thickening with concerns about acute Versus chronic cholecystitis--- patient is afebrile, lipase is not elevated,... No abdominal pain at rest or postprandial.... However leukocytosis has worsened since discontinuation of IV Rocephin----discussed with Dr. Arnoldo Morale from general surgery he advised restarting IV Rocephin for now.  MRCP without obstructive findings.  LFTs elevated presumably secondary to high-dose Diflucan use, LFT  trending down AST is down to 103 from 164, ALT is down to 128 from 164 (Diflucan was stopped on 05/12/2018), total bili up to 2.7 with Direct bilirubin is 1.9, alk phos is up to 534 from 521--- official surgical consult pending  4 Unspecified/unknown etiology Chronic Anemia--- ??? Etiology, stable with hemoglobin around 10 which is close to patient's baseline,    continue iron supplementation  5)Hypothyroidism--- patient's TSH was 6.3 on February 11, 2018, continue levothyroxine 100 mcg dialy  6)HLD--stable, stop pravastatin due to elevated LFTs  7)DM2--no frank DKA, as A1c was 9.6 in November 2019, reflecting poor diabetic control.  No further episodes significant hypoglycemia , continue Levemir insulin  at decreased dose of  6 units from 18 units,   continue Amaryl 4 mg with breakfast, use Novolog/Humalog Sliding scale insulin with Accu-Cheks/Fingersticks as ordered  8)Candidal intertrigo -use topical nystatin cream to groin/scrotum/perineum--- patient now has Candida Albicans in his blood, see #1 above  9) generalized  Weakness/debility--- patient with unsteady gait... PT eval appreciated patient will need home health PT  Disposition/Need for in-Hospital Stay- patient unable to be discharged at this time due to fungemia requiring IV  Eraxis, and elevated LFTs also awaiting Repeat blood culture results , patient previously failed oral antibiotics x2 as outpatient PTA--- new gallbladder concerns may need lap chole  Code Status : full  Family Communication:   Daughter by Phone  Disposition Plan  : home  Consults  : Phone consult with infectious disease specialist [name] on 05/10/2018 and 05/11/2018, Dr Johnnye Sima on 05/12/18  DVT Prophylaxis  :    - Heparin -  Lab Results  Component Value Date   PLT 234 05/13/2018    Inpatient Medications  Scheduled Meds: . aspirin EC  81 mg Oral Daily  . ferrous sulfate  325 mg Oral Q breakfast  . glimepiride  4 mg Oral Daily  . heparin  5,000 Units Subcutaneous Q8H  . insulin aspart  0-5 Units Subcutaneous QHS  . insulin aspart  0-9 Units Subcutaneous TID WC  . insulin detemir  6 Units Subcutaneous Daily  . levothyroxine  100 mcg Oral Daily  . magnesium oxide  400 mg Oral Daily  . multivitamin with minerals  1 tablet Oral Daily  . nutrition supplement (JUVEN)  1 packet Oral BID BM  . nystatin   Topical BID  . pravastatin  10 mg Oral q1800  . sodium chloride flush  3 mL Intravenous Once  . sodium chloride flush  3 mL Intravenous Q12H   Continuous Infusions: . sodium chloride    . 0.9 % NaCl with KCl 20 mEq / L 50 mL/hr at 05/13/18 0425  . anidulafungin Stopped (05/13/18 1348)  . [START ON 05/14/2018] cefTRIAXone (ROCEPHIN)  IV     PRN  Meds:.sodium chloride, acetaminophen **OR** acetaminophen, albuterol, guaiFENesin-dextromethorphan, ondansetron **OR** ondansetron (ZOFRAN) IV, polyethylene glycol, sodium chloride flush, traZODone    Anti-infectives (From admission, onward)   Start     Dose/Rate Route Frequency Ordered Stop   05/14/18 1100  cefTRIAXone (ROCEPHIN) 2 g in sodium chloride 0.9 % 100 mL IVPB     2 g 200 mL/hr over 30 Minutes Intravenous Every 24 hours 05/13/18 1046     05/13/18 1200  anidulafungin (ERAXIS) 200 mg in sodium chloride 0.9 % 200 mL IVPB  Status:  Discontinued     200 mg 78 mL/hr over 200 Minutes Intravenous Every 24 hours 05/12/18 1054 05/12/18 1054   05/13/18 1200  anidulafungin (ERAXIS) 100 mg in sodium chloride 0.9 % 100 mL IVPB     100 mg 78 mL/hr over 100 Minutes Intravenous Every 24 hours 05/12/18 1054 05/24/18 1159   05/13/18 1045  cefTRIAXone (ROCEPHIN) 1 g in sodium chloride 0.9 % 100 mL IVPB  Status:  Discontinued     1 g 200 mL/hr over 30 Minutes Intravenous Every 24 hours 05/13/18 1041 05/13/18 1046   05/12/18 1200  anidulafungin (ERAXIS) 200 mg  in sodium chloride 0.9 % 200 mL IVPB  Status:  Discontinued     200 mg 78 mL/hr over 200 Minutes Intravenous  Once 05/12/18 1054 05/12/18 1054   05/12/18 1200  anidulafungin (ERAXIS) 200 mg in sodium chloride 0.9 % 200 mL IVPB     200 mg 78 mL/hr over 200 Minutes Intravenous  Once 05/12/18 1054 05/12/18 1615   05/11/18 1000  fluconazole (DIFLUCAN) IVPB 400 mg  Status:  Discontinued     400 mg 100 mL/hr over 120 Minutes Intravenous Every 24 hours 05/10/18 0925 05/12/18 1044   05/11/18 0000  anidulafungin (ERAXIS) 100 mg in sodium chloride 0.9 % 100 mL IVPB  Status:  Discontinued     100 mg 78 mL/hr over 100 Minutes Intravenous Every 24 hours 05/10/18 0911 05/10/18 0924   05/10/18 1230  fluconazole (DIFLUCAN) IVPB 400 mg     400 mg 100 mL/hr over 120 Minutes Intravenous  Once 05/10/18 0945 05/10/18 2054   05/10/18 1130  fluconazole  (DIFLUCAN) IVPB 400 mg  Status:  Discontinued     400 mg 100 mL/hr over 120 Minutes Intravenous  Once 05/10/18 0940 05/10/18 0943   05/10/18 1030  fluconazole (DIFLUCAN) IVPB 800 mg  Status:  Discontinued     800 mg 200 mL/hr over 120 Minutes Intravenous  Once 05/10/18 0925 05/10/18 0937   05/10/18 1030  fluconazole (DIFLUCAN) IVPB 400 mg  Status:  Discontinued     400 mg 100 mL/hr over 120 Minutes Intravenous  Once 05/10/18 0940 05/10/18 0943   05/10/18 1030  fluconazole (DIFLUCAN) IVPB 400 mg     400 mg 100 mL/hr over 120 Minutes Intravenous  Once 05/10/18 0945 05/10/18 1644   05/10/18 0915  anidulafungin (ERAXIS) 200 mg in sodium chloride 0.9 % 200 mL IVPB  Status:  Discontinued     200 mg 78 mL/hr over 200 Minutes Intravenous  Once 05/10/18 0911 05/10/18 0925   05/09/18 1200  cefTRIAXone (ROCEPHIN) 1 g in sodium chloride 0.9 % 100 mL IVPB  Status:  Discontinued     1 g 200 mL/hr over 30 Minutes Intravenous Every 24 hours 05/08/18 1643 05/10/18 1730   05/08/18 1800  fluconazole (DIFLUCAN) IVPB 200 mg  Status:  Discontinued     200 mg 100 mL/hr over 60 Minutes Intravenous Every 24 hours 05/08/18 1750 05/10/18 0911   05/08/18 1400  fluconazole (DIFLUCAN) IVPB 200 mg  Status:  Discontinued     200 mg 100 mL/hr over 60 Minutes Intravenous Every 24 hours 05/08/18 1356 05/08/18 1750   05/08/18 1145  cefTRIAXone (ROCEPHIN) 1 g in sodium chloride 0.9 % 100 mL IVPB     1 g 200 mL/hr over 30 Minutes Intravenous  Once 05/08/18 1136 05/08/18 1235        Objective:   Vitals:   05/12/18 2112 05/13/18 0513 05/13/18 0758 05/13/18 1408  BP: 133/80 (!) 144/88  127/84  Pulse: (!) 106 (!) 105  100  Resp: '18 18  16  '$ Temp: 97.6 F (36.4 C) 97.7 F (36.5 C)  97.9 F (36.6 C)  TempSrc: Oral Oral  Oral  SpO2: 98% 99% 97% 100%  Weight:      Height:        Wt Readings from Last 3 Encounters:  05/08/18 68.7 kg  05/08/18 67.5 kg  04/21/18 67.6 kg     Intake/Output Summary (Last 24 hours)  at 05/13/2018 1804 Last data filed at 05/13/2018 1522 Gross per 24 hour  Intake 610 ml  Output 2200 ml  Net -1590 ml   Physical Exam Patient is examined daily including today on 05/13/18 , exams remain the same as of yesterday except that has changed   Gen:- Awake Alert,  In no apparent distress  HEENT:- [place].AT,  no visual complaints Neck-Supple Neck,No JVD,.  Lungs-  CTAB , fair symmetrical air movement CV- S1, S2 normal, regular  Abd-  +ve B.Sounds, Abd Soft, negative Murphy, no CVA area tenderness Extremity:- No  edema, pedal pulses present  Psych-affect is appropriate, oriented x3 Neuro-generalized weakness and gait disturbance but no new focal deficits, no tremors Skin -groin/scrotum/perineum with erythematous rash with satellite lesions consistent with candidal intertrigo GU--- Foley catheter with clear urine    Data Review:   Micro Results Recent Results (from the past 240 hour(s))  Urine culture     Status: None   Collection Time: 05/08/18  5:12 AM  Result Value Ref Range Status   Specimen Description   Final    URINE, CLEAN CATCH Performed at [facility], [address], [place], [place] [address]    Special Requests   Final    NONE Performed at [facility], [address] [place], [place] [address]    Culture   Final    NO GROWTH Performed at [facility], [place] [address], [place], [place] [address]    Report Status 05/09/2018 FINAL  Final  Urine Culture     Status: None   Collection Time: 05/08/18 11:22 AM  Result Value Ref Range Status   Specimen Description   Final    URINE, RANDOM Performed at [facility], [address] [place], [place] [address]    Special Requests   Final    NONE Performed at [facility], [address], [place], [place] [address]    Culture   Final    Multiple bacterial morphotypes present, none predominant. Suggest appropriate recollection if clinically indicated.   Report Status 05/09/2018 FINAL  Final  Culture, blood  (Routine x 2)     Status: None   Collection Time: 05/08/18 11:52 AM  Result Value Ref Range Status   Specimen Description BLOOD  Final   Special Requests NONE  Final   Culture   Final    NO GROWTH 5 DAYS Performed at [facility], [address], [place], [place] [address]    Report Status 05/13/2018 FINAL  Final  Culture, blood (Routine x 2)     Status: Abnormal   Collection Time: 05/08/18 11:52 AM  Result Value Ref Range Status   Specimen Description   Final    BLOOD RIGHT ARM Performed at [facility], [address] [place], [place] [address]    Special Requests   Final    BOTTLES DRAWN AEROBIC AND ANAEROBIC Blood Culture adequate volume Performed at [facility], [address], [place], [place] [address]    Culture  Setup Time   Final    YEAST Gram Stain Report Called to,Read Back By and Verified With: CULLEN,M. AT 0128 ON 05/10/2018 BY EVA AEROBIC BOTTLE ONLY Performed at [facility], READ BACK BY AND VERIFIED WITH: Iran Planas [id number] FCP Performed at [facility], [place] [address] [place], [place] [address]    Culture CANDIDA ALBICANS (A)  Final   Report Status 05/12/2018 FINAL  Final  Blood Culture (routine x 2)     Status: Abnormal (Preliminary result)   Collection Time: 05/08/18 11:52 AM  Result  Value Ref Range Status   Specimen Description   Final    BLOOD LEFT HAND Performed at [facility], [address] [place], [place] [address]    Special Requests   Final    BOTTLES DRAWN AEROBIC AND ANAEROBIC Blood Culture adequate volume Performed at [facility], [address], [place], [place] [address]    Culture  Setup Time   Final    YEAST Gram Stain Report Called to,Read Back By and Verified With: [place] S. @ [phone] ON [id number] BY Curry    Culture (A)  Final    CANDIDA ALBICANS Sent to [place] for further susceptibility testing. Performed at [facility], [place] [address]  [place], [place] [address]    Report Status PENDING  Incomplete  Blood Culture (routine x 2)     Status: None   Collection Time: 05/08/18 11:52 AM  Result Value Ref Range Status   Specimen Description BLOOD  Final   Special Requests NONE  Final   Culture   Final    NO GROWTH 5 DAYS Performed at [facility], [address], [place], [place] [address]    Report Status 05/13/2018 FINAL  Final  Blood Culture ID Panel (Reflexed)     Status: Abnormal   Collection Time: 05/08/18 11:52 AM  Result Value Ref Range Status   Enterococcus species NOT DETECTED NOT DETECTED Final   Listeria monocytogenes NOT DETECTED NOT DETECTED Final   Staphylococcus species NOT DETECTED NOT DETECTED Final   Staphylococcus aureus (BCID) NOT DETECTED NOT DETECTED Final   Streptococcus species NOT DETECTED NOT DETECTED Final   Streptococcus agalactiae NOT DETECTED NOT DETECTED Final   Streptococcus pneumoniae NOT DETECTED NOT DETECTED Final   Streptococcus pyogenes NOT DETECTED NOT DETECTED Final   Acinetobacter baumannii NOT DETECTED NOT DETECTED Final   Enterobacteriaceae species NOT DETECTED NOT DETECTED Final   Enterobacter cloacae complex NOT DETECTED NOT DETECTED Final   Escherichia coli NOT DETECTED NOT DETECTED Final   Klebsiella oxytoca NOT DETECTED NOT DETECTED Final   Klebsiella pneumoniae NOT DETECTED NOT DETECTED Final   Proteus species NOT DETECTED NOT DETECTED Final   Serratia marcescens NOT DETECTED NOT DETECTED Final   Haemophilus influenzae NOT DETECTED NOT DETECTED Final   Neisseria meningitidis NOT DETECTED NOT DETECTED Final   Pseudomonas aeruginosa NOT DETECTED NOT DETECTED Final   Candida albicans DETECTED (A) NOT DETECTED Final    Comment: CRITICAL RESULT CALLED TO, READ BACK BY AND VERIFIED WITH: PHARMD SCOTT HALL [phone] FCP    Candida glabrata NOT DETECTED NOT DETECTED Final   Candida krusei NOT DETECTED NOT DETECTED Final   Candida parapsilosis NOT DETECTED NOT DETECTED Final    Candida tropicalis NOT DETECTED NOT DETECTED Final    Comment: Performed at [facility], [address] [place], [place] [address]  Antifungal AST 9 Drug Panel     Status: None (Preliminary result)   Collection Time: 05/08/18 11:52 AM  Result Value Ref Range Status   Organism ID, Yeast Preliminary report  Final    Comment: Specimen has been received and testing has been initiated.   Amphotericin B MIC PENDING  Incomplete   Please Note: Comment  Final    Comment: (NOTE) CLSI does not have established guidelines for interpretation of these organism-drug combinations. For research use only.    Anidulafungin MIC PENDING  Incomplete   Caspofungin MIC PENDING  Incomplete   Micafungin MIC PENDING  Incomplete   Posaconazole  MIC PENDING  Incomplete   Please Note: Comment  Final    Comment: (NOTE) Results for this test are for research purposes only by the assay's manufacturer.  The performance characteristics of this product have not been established.  Results should not be used as a diagnostic procedure without confirmation of the diagnosis by another medically established diagnostic product or procedure. Performed At: [facility] [place], [place] [id number] Rush Farmer MD JJ:[id number]    Fluconazole Islt MIC PENDING  Incomplete   Flucytosine MIC PENDING  Incomplete   Itraconazole MIC PENDING  Incomplete   Voriconazole MIC PENDING  Incomplete   Source CANDIDA ALBICANS/ BLOOD  Final    Comment: Performed at [facility], [place] [address] [place], [place] [address]  Urine Culture     Status: None   Collection Time: 05/08/18  2:40 PM  Result Value Ref Range Status   Specimen Description   Final    URINE, CATHETERIZED Performed at [facility], [address] [place], [place] [address]    Special Requests   Final    Normal Performed at [facility], [address] [place], [place] [address]    Culture   Final    NO GROWTH Performed at [facility], [place] [address] [place], [place] [address]    Report Status 05/09/2018 FINAL  Final  MRSA PCR Screening     Status: None   Collection Time: 05/09/18  8:55 AM  Result Value Ref Range Status   MRSA by PCR NEGATIVE NEGATIVE Final    Comment:        The GeneXpert MRSA Assay (FDA approved for NASAL specimens only), is one component of a comprehensive MRSA colonization surveillance program. It is not intended to diagnose MRSA infection nor to guide or monitor treatment for MRSA infections. Performed at [facility], [address], [place], [place] [address]   Culture, blood (Routine X 2) w Reflex to ID Panel     Status: None (Preliminary result)   Collection Time: 05/10/18  9:26 AM  Result Value Ref Range Status   Specimen Description   Final    BLOOD LEFT HAND BOTTLES DRAWN AEROBIC AND ANAEROBIC   Special Requests Blood Culture adequate volume  Final   Culture   Final    NO GROWTH 3 DAYS Performed at [facility], [address], [place], [place] [address]    Report Status PENDING  Incomplete  Culture, blood (Routine X 2) w Reflex to ID Panel     Status: None (Preliminary result)   Collection Time: 05/10/18  9:28 AM  Result Value Ref Range Status   Specimen Description   Final    BLOOD RIGHT HAND BOTTLES DRAWN AEROBIC AND ANAEROBIC   Special Requests Blood Culture adequate volume  Final   Culture   Final    NO GROWTH 3 DAYS Performed at [facility], [address], [place], [place] [address]    Report Status PENDING  Incomplete    Radiology Reports Dg Chest 2 View  Result Date: 05/08/2018 CLINICAL DATA:  85 year old male with a history of nausea and shaking EXAM: CHEST - 2 VIEW COMPARISON:  01/03/2018 FINDINGS: Cardiomediastinal silhouette unchanged in size and contour. No evidence of central vascular congestion. No interlobular septal thickening. No pneumothorax or pleural effusion. Coarsened interstitial markings. No confluent airspace disease. No displaced  fracture. IMPRESSION: Negative for acute cardiopulmonary disease, with background of chronic lung changes. Electronically Signed   By: York Cerise  Earleen Newport D.O.   On: 05/08/2018 12:16   Mr 3d Recon At Scanner  Result Date: 05/13/2018 CLINICAL DATA:  Jaundice and abdominal pain. EXAM: MRI ABDOMEN WITH CONTRAST (WITH MRCP) TECHNIQUE: Multiplanar multisequence MR imaging of the abdomen was performed following the administration of intravenous contrast. Heavily T2-weighted images of the biliary and pancreatic ducts were obtained, and three-dimensional MRCP images were rendered by post processing. CONTRAST:  7 cc Gadavist COMPARISON:  03/06/2018 FINDINGS: Significantly diminished exam detail secondary to motion artifact Lower chest: Small bilateral pleural effusions. Hepatobiliary: Mild diffuse hepatic steatosis. Small cyst within segment 8 measures 8 mm, image 28/8. No suspicious liver abnormality identified. No gallstones. No biliary ductal dilatation. The common bile duct has a normal caliber. Within the limitations of motion artifact no definite evidence for choledocholithiasis identified. Pancreas: There is diffuse volume loss from the neck through tail of pancreas. No main duct dilatation, inflammation or mass identified. Spleen:  Within normal limits in size and appearance. Adrenals/Urinary Tract: The adrenal glands appear normal. Simple appearing cyst arising from the posterolateral cortex of right kidney measures 2.3 cm, image 54/8. Cyst within the anterior cortex of the left mid kidney measures 1.4 cm. No enhancing mass or hydronephrosis identified within the limitations of motion artifact. Stomach/Bowel: Small duodenal diverticulum noted. Visualized portions of the small and large bowel loops within the abdomen are otherwise unremarkable. Vascular/Lymphatic: Normal appearance of the abdominal aorta. No aneurysm. The portal vein and hepatic veins are patent. No adenopathy. Other:  No free fluid or fluid  collections. Musculoskeletal: No suspicious bone lesions identified. IMPRESSION: 1. No evidence for biliary obstruction to explain patient's jaundice. 2. Mild hepatic steatosis. 3. Kidney and liver cysts. Electronically Signed   By: Kerby Moors M.D.   On: 05/13/2018 15:38   Mr Abdomen With Mrcp W Contrast  Result Date: 05/13/2018 CLINICAL DATA:  Jaundice and abdominal pain. EXAM: MRI ABDOMEN WITH CONTRAST (WITH MRCP) TECHNIQUE: Multiplanar multisequence MR imaging of the abdomen was performed following the administration of intravenous contrast. Heavily T2-weighted images of the biliary and pancreatic ducts were obtained, and three-dimensional MRCP images were rendered by post processing. CONTRAST:  7 cc Gadavist COMPARISON:  03/06/2018 FINDINGS: Significantly diminished exam detail secondary to motion artifact Lower chest: Small bilateral pleural effusions. Hepatobiliary: Mild diffuse hepatic steatosis. Small cyst within segment 8 measures 8 mm, image 28/8. No suspicious liver abnormality identified. No gallstones. No biliary ductal dilatation. The common bile duct has a normal caliber. Within the limitations of motion artifact no definite evidence for choledocholithiasis identified. Pancreas: There is diffuse volume loss from the neck through tail of pancreas. No main duct dilatation, inflammation or mass identified. Spleen:  Within normal limits in size and appearance. Adrenals/Urinary Tract: The adrenal glands appear normal. Simple appearing cyst arising from the posterolateral cortex of right kidney measures 2.3 cm, image 54/8. Cyst within the anterior cortex of the left mid kidney measures 1.4 cm. No enhancing mass or hydronephrosis identified within the limitations of motion artifact. Stomach/Bowel: Small duodenal diverticulum noted. Visualized portions of the small and large bowel loops within the abdomen are otherwise unremarkable. Vascular/Lymphatic: Normal appearance of the abdominal aorta. No  aneurysm. The portal vein and hepatic veins are patent. No adenopathy. Other:  No free fluid or fluid collections. Musculoskeletal: No suspicious bone lesions identified. IMPRESSION: 1. No evidence for biliary obstruction to explain patient's jaundice. 2. Mild hepatic steatosis. 3. Kidney and liver cysts. Electronically Signed   By: Kerby Moors M.D.   On: 05/13/2018 15:38  US Abdomen Limited Ruq  Result Date: 05/13/2018 CLINICAL DATA:  RIGHT upper quadrant pain. EXAM: ULTRASOUND ABDOMEN LIMITED RIGHT UPPER QUADRANT COMPARISON:  CT abdomen and pelvis 03/06/2018. Ultrasound abdomen 02/01/2018. FINDINGS: Gallbladder: Cholelithiasis is present. Largest calculus is 3-4 mm in longest dimension. Gallbladder wall thickening is present, up to 5 mm. Negative sonographic Murphy's sign. Common bile duct: Diameter: 2.1 mm Liver: No focal lesion. Heterogeneous, mildly increased echogenicity. Portal vein is patent on color Doppler imaging with normal direction of blood flow towards the liver. Compared with previous ultrasound in December 2019, stones have developed and the gallbladder wall is now thickened, but was previously normal. IMPRESSION: Cholelithiasis. Possible acute versus chronic cholecystitis with gallbladder wall thickening. Negative sonographic Murphy's sign. Correlate clinically. Electronically Signed   By: Staci Righter M.D.   On: 05/13/2018 09:02     CBC Recent Labs  Lab 05/08/18 1151 05/08/18 1152 05/09/18 0549 05/10/18 0605 05/12/18 0431 05/13/18 0442  WBC 12.2* 12.2* 17.4* 9.7 14.0* 16.7*  HGB 9.4* 9.5* 10.0* 9.6* 10.0* 10.0*  HCT 30.1* 30.2* 32.1* 30.9* 32.6* 32.4*  PLT 354 357 307 215 223 234  MCV 82.0 81.8 82.1 81.7 80.9 81.8  MCH 25.6* 25.7* 25.6* 25.4* 24.8* 25.3*  MCHC 31.2 31.5 31.2 31.1 30.7 30.9  RDW 14.4 14.3 14.7 14.7 14.4 14.7  LYMPHSABS 0.1* 0.1*  --   --   --   --   MONOABS 0.3 0.3  --   --   --   --   EOSABS 0.0 0.0  --   --   --   --   BASOSABS 0.0 0.0  --   --    --   --     Chemistries  Recent Labs  Lab 05/08/18 1151 05/08/18 1152 05/09/18 0549 05/10/18 0605 05/12/18 0431 05/13/18 0442  NA 134* 133* 133* 132* 136 136  K 3.7 3.6 2.9* 3.6 2.9* 3.7  CL 105 101 103 103 104 107  CO2 20* 20* 19* 20* 24 20*  GLUCOSE 175* 177* 101* 66* 110* 124*  BUN '20 20 16 '$ 26* 19 30*  CREATININE 0.85 0.81 0.91 0.97 0.81 1.08  CALCIUM 8.3* 8.3* 7.8* 8.0* 8.5* 8.4*  MG  --   --   --  1.6*  --   --   AST 51* 50*  --   --  164* 103*  ALT 29 30  --   --  164* 128*  ALKPHOS 122 121  --   --  521* 534*  BILITOT 0.4 0.6  --   --  2.5* 2.7*   ------------------------------------------------------------------------------------------------------------------ No results for input(s): CHOL, HDL, LDLCALC, TRIG, CHOLHDL, LDLDIRECT in the last 72 hours.  Lab Results  Component Value Date   HGBA1C 9.6 (H) 01/04/2018   ------------------------------------------------------------------------------------------------------------------ No results for input(s): TSH, T4TOTAL, T3FREE, THYROIDAB in the last 72 hours.  Invalid input(s): FREET3 ------------------------------------------------------------------------------------------------------------------ No results for input(s): VITAMINB12, FOLATE, FERRITIN, TIBC, IRON, RETICCTPCT in the last 72 hours.  Coagulation profile Recent Labs  Lab 05/08/18 1151  INR 1.1    No results for input(s): DDIMER in the last 72 hours.  Cardiac Enzymes No results for input(s): CKMB, TROPONINI, MYOGLOBIN in the last 168 hours.  Invalid input(s): CK ------------------------------------------------------------------------------------------------------------------ No results found for: BNP   Roxan Hockey M.D on 05/13/2018 at 6:04 PM  Go to [email or web address] - for contact info  [facility] - Office  [phone]

## 2018-05-13 NOTE — Progress Notes (Signed)
OT Cancellation Note  Patient Details Name: Anthony Dunlap MRN: [id number] DOB: 24-Mar-1933   Cancelled Treatment:    Reason Eval/Treat Not Completed: Patient at procedure or test/ unavailable. Will check back tomorrow.   Guadelupe Sabin, OTR/L  [phone] 05/13/2018, 8:46 AM

## 2018-05-13 NOTE — Progress Notes (Signed)
Inpatient Diabetes Program Recommendations  AACE/ADA: New Consensus Statement on Inpatient Glycemic Control (2015)  Target Ranges:  Prepandial:   less than 140 mg/dL      Peak postprandial:   less than 180 mg/dL (1-2 hours)      Critically ill patients:  140 - 180 mg/dL   Lab Results  Component Value Date   GLUCAP 278 (H) 05/13/2018   HGBA1C 9.6 (H) 01/04/2018    Review of Glycemic Control Results for Anthony Dunlap, Anthony Dunlap (MRN [id number]) as of 05/13/2018 12:57  Ref. Range 05/12/2018 22:18 05/12/2018 22:42 05/12/2018 23:16 05/13/2018 07:30 05/13/2018 11:11  Glucose-Capillary Latest Ref Range: 70 - 99 mg/dL 57 (L) 77 111 (H) 137 (H) 278 (H)   Inpatient Diabetes Program Recommendations:   Hypoglycemia post Novolog correction. Change Novolog correction to custom correction: -Custom Novolog correction scale 0-5 units       151-200  1 unit      201-250  2 units      251-300  3 units      301-350  4 units      351-400  5 units  Thank you, [name] E. Tenzin Pavon, RN, MSN, CDE  Diabetes Coordinator Inpatient Glycemic Control Team Team Pager [phone] (8am-5pm) 05/13/2018 12:58 PM

## 2018-05-13 NOTE — TOC Progression Note (Signed)
Transition of Care [facility]) - Progression Note    Patient Details  Name: Anthony Dunlap MRN: [id number] Date of Birth: 05-Oct-1933  Transition of Care [facility]) CM/SW Contact  Roda Shutters Margretta Sidle, RN Phone Number: 05/13/2018, 5:05 PM  Clinical Narrative:   PT has changed recommendation to home with [facility]. Pt will still needs IV antifungal through April 4th. CM discussed DC plan with daughter who would like to take pt home with [facility] through [facility] (he's used them in the past) has no preference of home Infusion provider, referral sent to [facility].  Family willing and able to administer IV infusion. CM will follow to coordinate arrangments for home heath PT and IV infussion.    Expected Discharge Plan: [place]    Expected Discharge Plan and Services Expected Discharge Plan: [place] Choice: [place] arrangements for the past 2 months: Single Family Home                     HH Arranged: RN, PT, IV Antibiotics HH Agency: [facility]), Other - See comment([facility])   Readmission Risk Interventions Readmission Risk Prevention Plan 05/09/2018  Transportation Screening Complete  PCP or Specialist Appt within 3-5 Days Complete  HRI or [place] Complete  Social Work Consult for [place] Planning/Counseling Complete  Palliative Care Screening Not Applicable  Medication Review Press photographer) Complete  Some recent data might be hidden

## 2018-05-14 ENCOUNTER — Inpatient Hospital Stay: Payer: Self-pay

## 2018-05-14 DIAGNOSIS — K8020 Calculus of gallbladder without cholecystitis without obstruction: Secondary | ICD-10-CM

## 2018-05-14 LAB — COMPREHENSIVE METABOLIC PANEL
ALT: 109 U/L — ABNORMAL HIGH (ref 0–44)
AST: 71 U/L — ABNORMAL HIGH (ref 15–41)
Albumin: 2.4 g/dL — ABNORMAL LOW (ref 3.5–5.0)
Alkaline Phosphatase: 578 U/L — ABNORMAL HIGH (ref 38–126)
Anion gap: 7 (ref 5–15)
BILIRUBIN TOTAL: 2.6 mg/dL — AB (ref 0.3–1.2)
BUN: 40 mg/dL — ABNORMAL HIGH (ref 8–23)
CO2: 20 mmol/L — ABNORMAL LOW (ref 22–32)
Calcium: 8.6 mg/dL — ABNORMAL LOW (ref 8.9–10.3)
Chloride: 106 mmol/L (ref 98–111)
Creatinine, Ser: 1.07 mg/dL (ref 0.61–1.24)
GFR calc Af Amer: >60 mL/min (ref >60)
GFR calc non Af Amer: >60 mL/min (ref >60)
Glucose, Bld: 152 mg/dL — ABNORMAL HIGH (ref 70–99)
POTASSIUM: 3.5 mmol/L (ref 3.5–5.1)
Sodium: 133 mmol/L — ABNORMAL LOW (ref 135–145)
Total Protein: 6.6 g/dL (ref 6.5–8.1)

## 2018-05-14 LAB — GLUCOSE, CAPILLARY
GLUCOSE-CAPILLARY: 393 mg/dL — AB (ref 70–99)
Glucose-Capillary: 226 mg/dL — ABNORMAL HIGH (ref 70–99)
Glucose-Capillary: 413 mg/dL — ABNORMAL HIGH (ref 70–99)

## 2018-05-14 LAB — CBC
HCT: 32.2 % — ABNORMAL LOW (ref 39.0–52.0)
Hemoglobin: 10.1 g/dL — ABNORMAL LOW (ref 13.0–17.0)
MCH: 25.4 pg — ABNORMAL LOW (ref 26.0–34.0)
MCHC: 31.4 g/dL (ref 30.0–36.0)
MCV: 81.1 fL (ref 80.0–100.0)
Platelets: 331 10*3/uL (ref 150–400)
RBC: 3.97 MIL/uL — ABNORMAL LOW (ref 4.22–5.81)
RDW: 15.2 % (ref 11.5–15.5)
WBC: 16.5 10*3/uL — ABNORMAL HIGH (ref 4.0–10.5)
nRBC: 0 % (ref 0.0–0.2)

## 2018-05-14 MED ORDER — INSULIN DETEMIR 100 UNIT/ML ~~LOC~~ SOLN: 6.0000 [IU] | Freq: Every day | SUBCUTANEOUS | 11 refills | Status: DC

## 2018-05-14 MED ORDER — SODIUM CHLORIDE 0.9 % IV SOLN: 100.0000 mg | INTRAVENOUS | 0 refills | Status: AC

## 2018-05-14 MED ORDER — SODIUM CHLORIDE 0.9% FLUSH: 10.0000 mL | INTRAVENOUS | Status: DC | PRN

## 2018-05-14 NOTE — Care Management Important Message (Signed)
Important Message  Patient Details  Name: MAXWEL MEADOWCROFT MRN: [id number] Date of Birth: 05/11/1933   Medicare Important Message Given:  Yes    Sherald Barge, RN 05/14/2018, 3:25 PM

## 2018-05-14 NOTE — Progress Notes (Signed)
Physical Therapy Treatment Patient Details Name: Anthony Dunlap MRN: [id number] DOB: 05-29-1933 Today's Date: 05/14/2018    History of Present Illness 85 y.o. male with PMHx of DM2, HLD and Hypothyroidism as well as chronic Anemia and chronic urinary Retention with chronic indwelling Foley catheter in the setting of prior history of prostate cancer admitted on 05/08/2018 with presumed UTI after presenting to the ED twice within a 24-hour period with urinary and Foley catheter complaints, now found to have fungemia with Candida albicans..(Simultaneous filing. User may not have seen previous data.)    PT Comments    This session was a co-treatment with occupational therapy. Patient was found awake and alert in bed with nursing staff in room. Patient performed supine to sitting at EOB with supervision. His sitting balance was improved this session. Patient performed sit to stand with min A. Patient denied any dizziness or lightheadedness upon standing. Patient ambulated 260 feet using RW with intermittent minimal guard. Patient returned to room reporting fatigue. Therapists performed seated strengthening and AROM exercises to patient's tolerance. Patient was left in bedside chair, with alarm on and all needs met. Patient would benefit from continued skilled physical therapy while in the hospital and with home health PT in order to continue progressing towards functional goals.   Follow Up Recommendations  Home health PT;Supervision - Intermittent;Supervision for mobility/OOB     Equipment Recommendations  None recommended by PT    Recommendations for Other Services       Precautions / Restrictions Precautions Precautions: Fall Restrictions Weight Bearing Restrictions: No    Mobility  Bed Mobility Overal bed mobility: Needs Assistance Bed Mobility: Supine to Sit     Supine to sit: Supervision     General bed mobility comments: Once seated EOB patient demonstrated ability to maintain  sitting balance with upper extremity and lower extremities supported.   Transfers Overall transfer level: Needs assistance Equipment used: Rolling walker (2 wheeled) Transfers: Sit to/from Stand Sit to Stand: Min assist         General transfer comment: Required assistance from lowered bed to perform sit to stand  Ambulation/Gait Ambulation/Gait assistance: Supervision;Min guard Gait Distance (Feet): 260 Feet Assistive device: Rolling walker (2 wheeled) Gait Pattern/deviations: Step-to pattern;Decreased stride length;Staggering left;Staggering right Gait velocity: decreased   General Gait Details: Ambulated 2 x around nursing station and through room with only minimal pauses   Stairs             Wheelchair Mobility    Modified Rankin (Stroke Patients Only)       Balance Overall balance assessment: Modified Independent Sitting-balance support: Feet supported;Bilateral upper extremity supported Sitting balance-Leahy Scale: Good Sitting balance - Comments: Patient able to self-correct sitting posture this session   Standing balance support: Bilateral upper extremity supported(RW) Standing balance-Leahy Scale: Fair Standing balance comment: fair with RW                            Cognition Arousal/Alertness: Awake/alert Behavior During Therapy: WFL for tasks assessed/performed Overall Cognitive Status: Within Functional Limits for tasks assessed                                        Exercises General Exercises - Upper Extremity Shoulder Flexion: AROM;10 reps;Seated Shoulder Horizontal ABduction: AROM;Both;10 reps;Seated Shoulder Horizontal ADduction: AROM;Both;10 reps;Seated General Exercises - Lower Extremity Long Arc Quad:  Both;10 reps;Seated;Strengthening;AROM Hip ABduction/ADduction: Seated;Strengthening;AROM;Both;10 reps Hip Flexion/Marching: AROM;Strengthening;Right;Left;10 reps;Seated Toe Raises:  AROM;Strengthening;Seated;Both;10 reps Heel Raises: AROM;Strengthening;Seated;Both;10 reps Mini-Sqauts: (bridging x 10) Other Exercises Other Exercises: Proximal shoulder strengthening; Bilateral UE, A/ROM, 10X each, rest break between each set. Other Exercises: Shoulder protractions; A/ROM, seated, 10X Other Exercises: Overhead press, A/ROM, seated, 10X    General Comments        Pertinent Vitals/Pain Pain Assessment: No/denies pain    Home Living                      Prior Function            PT Goals (current goals can now be found in the care plan section) Acute Rehab PT Goals Patient Stated Goal: home PT Goal Formulation: With patient Time For Goal Achievement: 05/19/18 Potential to Achieve Goals: Good Progress towards PT goals: Progressing toward goals    Frequency    Min 3X/week      PT Plan Current plan remains appropriate    Co-evaluation PT/OT/SLP Co-Evaluation/Treatment: Yes Reason for Co-Treatment: To address functional/ADL transfers(Simultaneous filing. User may not have seen previous data.) PT goals addressed during session: Mobility/safety with mobility;Strengthening/ROM OT goals addressed during session: ADL's and self-care;Proper use of Adaptive equipment and DME;Strengthening/ROM      AM-PAC PT "6 Clicks" Mobility   Outcome Measure  Help needed turning from your back to your side while in a flat bed without using bedrails?: A Little Help needed moving from lying on your back to sitting on the side of a flat bed without using bedrails?: A Little Help needed moving to and from a bed to a chair (including a wheelchair)?: A Little Help needed standing up from a chair using your arms (e.g., wheelchair or bedside chair)?: A Little Help needed to walk in hospital room?: A Little Help needed climbing 3-5 steps with a railing? : A Lot 6 Click Score: 17    End of Session Equipment Utilized During Treatment: Gait belt Activity Tolerance:  Patient tolerated treatment well;Patient limited by fatigue Patient left: in chair;with call bell/phone within reach;with chair alarm set(nursing students in room ) Nurse Communication: Mobility status PT Visit Diagnosis: Muscle weakness (generalized) (M62.81);Difficulty in walking, not elsewhere classified (R26.2);Unsteadiness on feet (R26.81)     Time: 8891-6945 PT Time Calculation (min) (ACUTE ONLY): 33 min  Charges:  $Gait Training: 8-22 mins                    Clarene Critchley PT, DPT 10:00 AM, 05/14/18 [phone]

## 2018-05-14 NOTE — Progress Notes (Signed)
Occupational Therapy Treatment Patient Details Name: Anthony Dunlap MRN: [id number] DOB: [birth year] Today's Date: 05/14/2018    History of present illness 85 y.o. male with PMHx of DM2, HLD and Hypothyroidism as well as chronic Anemia and chronic urinary Retention with chronic indwelling Foley catheter in the setting of prior history of prostate cancer admitted on 05/08/2018 with presumed UTI after presenting to the ED twice within a 24-hour period with urinary and Foley catheter complaints, now found to have fungemia with Candida albicans..(Simultaneous filing. User may not have seen previous data.)   OT comments  Co-tx completed with PT. Patient has made more progress since initial evaluation and discharge plan as been updated for Home Health OT. Case manager made aware. Patient completed BUE strengthening exercises after ambulating with PT around unit. Patient requested fresh ice water and all needs were met prior to leaving room.   Follow Up Recommendations  Home health OT    Equipment Recommendations  None recommended by OT       Precautions / Restrictions Precautions Precautions: Fall Restrictions Weight Bearing Restrictions: No              ADL either performed or assessed with clinical judgement   ADL Overall ADL's : Needs assistance/impaired                     Lower Body Dressing: Bed level;Total assistance Lower Body Dressing Details (indicate cue type and reason): Donning hospital socks. Patient reports that he uses a sock aid at home.  Toilet Transfer: Minimal assistance;Stand-pivot;Ambulation;RW           Functional mobility during ADLs: Minimal assistance;Cueing for safety;Rolling walker General ADL Comments: Patient ambulates with RW too far in front of him. VC to stay within walker and to use BUE to keep upper body upright.     Vision Baseline Vision/History: No visual deficits Patient Visual Report: No change from baseline             Cognition Arousal/Alertness: Awake/alert Behavior During Therapy: WFL for tasks assessed/performed Overall Cognitive Status: Within Functional Limits for tasks assessed              Exercises General Exercises - Upper Extremity Shoulder Flexion: AROM;10 reps;Seated Shoulder Horizontal ABduction: AROM;Both;10 reps;Seated Shoulder Horizontal ADduction: AROM;Both;10 reps;Seated Other Exercises Other Exercises: Proximal shoulder strengthening; Bilateral UE, A/ROM, 10X each, rest break between each set. Other Exercises: Shoulder protractions; A/ROM, seated, 10X Other Exercises: Overhead press, A/ROM, seated, 10X           Pertinent Vitals/ Pain       Pain Assessment: No/denies pain      Progress Toward Goals  OT Goals(current goals can now be found in the care plan section)  Progress towards OT goals: Progressing toward goals     Plan Discharge plan needs to be updated;Frequency remains appropriate    Co-evaluation      Reason for Co-Treatment: To address functional/ADL transfers(Simultaneous filing. User may not have seen previous data.) PT goals addressed during session: Mobility/safety with mobility;Strengthening/ROM OT goals addressed during session: ADL's and self-care;Proper use of Adaptive equipment and DME;Strengthening/ROM      AM-PAC OT "6 Clicks" Daily Activity     Outcome Measure   Help from another person eating meals?: None Help from another person taking care of personal grooming?: A Little Help from another person toileting, which includes using toliet, bedpan, or urinal?: A Lot Help from another person bathing (including washing, rinsing, drying)?: A Lot Help  from another person to put on and taking off regular upper body clothing?: A Lot Help from another person to put on and taking off regular lower body clothing?: Total 6 Click Score: 14    End of Session Equipment Utilized During Treatment: Gait belt;Rolling walker  OT Visit Diagnosis:  Muscle weakness (generalized) (M62.81)   Activity Tolerance Patient tolerated treatment well   Patient Left in chair;with call bell/phone within reach;with chair alarm set           Time: 0852(co-tx with PT)-0925 OT Time Calculation (min): 33 min  Charges: OT General Charges $OT Visit: 1 Visit OT Treatments $Therapeutic Exercise: 8-22 mins(15')  Ailene Ravel, OTR/L,CBIS  [phone]   Joury Allcorn, Clarene Duke 05/14/2018, 9:39 AM

## 2018-05-14 NOTE — TOC Transition Note (Signed)
Transition of Care [facility]) - CM/SW Discharge Note   Patient Details  Name: Anthony Dunlap MRN: [id number] Date of Birth: February 14, 1934  [facility]) CM/SW Contact:  Sherald Barge, RN Phone Number: 05/14/2018, 2:25 PM   Clinical Narrative:   CM verified first home dose could be given by [facility] RN and [facility] tomorrow afternoon. Pt will DC home today after PICC placement.     Final next level of care: [place]     Patient Goals and CMS Choice Patient states their goals for this hospitalization and ongoing recovery are:: be home CMS Medicare.gov Compare Post Acute Care list provided to:: Patient Represenative (must comment) Choice offered to / list presented to : Adult Children    Discharge Plan and Services     Post Acute Care Choice: Home Health              HH Arranged: RN, PT, IV Antibiotics HH [facility] Other - See comment([facility])   Readmission Risk Interventions Readmission Risk Prevention Plan 05/09/2018  Transportation Screening Complete  PCP or Specialist Appt within 3-5 Days Complete  HRI or [place] Complete  Social Work Consult for [place] Planning/Counseling Complete  Palliative Care Screening Not Applicable  Medication Review Press photographer) Complete  Some recent data might be hidden

## 2018-05-14 NOTE — Discharge Summary (Signed)
Physician Discharge Summary  Anthony Dunlap YNW:[id number] DOB: [birth year] DOA: 05/08/2018  PCP: [name], MD  Admit date: 05/08/2018  Discharge date: 05/14/2018  Admitted From:Home   Disposition:  Home  Recommendations for Outpatient Follow-up:  1. Follow up with PCP in 1-2 weeks 2. Continue on Eraxis via home infusions as ordered through 05/24/18 3. Follow up with ophthalmology once treatment is completed to ensure that there is no fungal endophthalmitis, please refer patient for evaluation once treatment is complete  Home Health: Yes with PT and home infusions  Equipment/Devices: None  Discharge Condition: Stable  CODE STATUS: Full  Diet recommendation: Heart Healthy/carb modified  Brief/Interim Summary: Per HPI: 85 y.o.male with PMHx of DM2, HLD and Hypothyroidism as well as chronic Anemia and chronic urinary Retention with chronic indwelling Foley catheter in the setting of prior history of prostate cancer admitted on 05/08/2018 with presumed UTI after presenting to the ED twice within a 24-hour period with urinary and Foley catheter complaints, now found to have fungemia with Candida albicans... Phone consult with infectious disease specialist [name] on 05/10/2018 and 05/11/2018, phone consult with Dr. Johnnye Sima on 05/12/2018  Patient was treated for sepsis secondary to fungemia due to Candida albicans and has responded well to IV Eraxis treatment that was initiated on 3/23.  LFTs have been downtrending and continue to maintain elevated bilirubin as well as alkaline phosphatase levels which was further evaluated via imaging and by general surgery.  MRCP was performed with no signs of obstruction noted.  No significant signs of cholecystitis noted on abdominal ultrasound as well, and clinically patient appears to be doing quite well with no abdominal pain, nausea, or vomiting.  He is tolerating his oral intake.  He was seen by general surgery on the day of discharge with no  concerns for any need for surgical intervention.  He will have PICC line placed prior to discharge and has home infusion set up to run through 4/4.  Blood cultures have remained negative since 3/21.  No other acute events noted during the course of this admission and patient is otherwise stable for discharge.  Patient will need to follow-up with ophthalmology in the near future to ensure that there is no fungal endophthalmitis.  Discharge Diagnoses:  Principal Problem:   Fungemia/Candida albicans Active Problems:   Type 2 diabetes mellitus ([name])   Chronic anemia   Chronic indwelling Foley catheter   Hypothyroidism   UTI (urinary tract infection)   Complicated UTI (urinary tract infection)   Calculus of gallbladder without cholecystitis without obstruction    Discharge Instructions  Discharge Instructions    Diet - low sodium heart healthy   Complete by:  As directed    Increase activity slowly   Complete by:  As directed      Allergies as of 05/14/2018   No Known Allergies     Medication List    STOP taking these medications   amoxicillin-clavulanate 875-125 MG tablet Commonly known as:  AUGMENTIN   ampicillin-sulbactam  IVPB Commonly known as:  UNASYN   Levemir FlexTouch 100 UNIT/ML Pen Generic drug:  Insulin Detemir Replaced by:  insulin detemir 100 UNIT/ML injection     TAKE these medications   acetaminophen 325 MG tablet Commonly known as:  TYLENOL Take 2 tablets (650 mg total) by mouth every 6 (six) hours as needed for mild pain, fever or headache.   anidulafungin 100 mg in sodium chloride 0.9 % 100 mL Inject 100 mg into the vein daily for  10 days. Start taking on:  May 15, 2018   aspirin EC 81 MG tablet Take 81 mg by mouth daily.   ferrous sulfate 325 (65 FE) MG tablet Take 1 tablet (325 mg total) by mouth daily with breakfast.   glimepiride 4 MG tablet Commonly known as:  AMARYL Take 1 tablet by mouth daily.   insulin aspart 100 UNIT/ML  injection Commonly known as:  novoLOG Inject 0-15 Units into the skin 3 (three) times daily with meals. Sliding scale insulin Less than 70 initiate hypoglycemia protocol 70-120  0 units 120-150 2 unit 151-200 3 units 201-250 3 units 251-300 5 units 301-350 8 units 351-400 11 units  Greater than 400 15 units , call MD   insulin detemir 100 UNIT/ML injection Commonly known as:  LEVEMIR Inject 0.06 mLs (6 Units total) into the skin daily. Start taking on:  May 15, 2018 Replaces:  Levemir FlexTouch 100 UNIT/ML Pen   levothyroxine 100 MCG tablet Commonly known as:  SYNTHROID, LEVOTHROID Take 1 tablet by mouth daily.   magnesium oxide 400 (241.3 Mg) MG tablet Commonly known as:  MAG-OX Take 1 tablet (400 mg total) by mouth daily.   meloxicam 7.5 MG tablet Commonly known as:  MOBIC Take 1 tablet by mouth 2 (two) times daily.   multivitamin with minerals Tabs tablet Take 1 tablet by mouth daily.   nutrition supplement (JUVEN) Pack Take 1 packet by mouth 2 (two) times daily between meals.   nystatin powder Commonly known as:  MYCOSTATIN/NYSTOP Apply topically 4 (four) times daily.   pravastatin 10 MG tablet Commonly known as:  PRAVACHOL Take 10 mg by mouth daily.   Soliqua 100-33 UNT-MCG/ML Sopn Generic drug:  Insulin Glargine-Lixisenatide Inject 35-65 Units as directed daily.       Contact information for follow-up providers    [name], MD Follow up on 05/26/2018.   Specialty:  Internal Medicine Why:  10:30AM Contact information: [address] [place] [address] [phone]            Contact information for after-discharge care    [facility] .   Service:  Skilled Nursing Contact information: [address] [place] [phone]                 No Known Allergies  Consultations:  General surgery  ID   Procedures/Studies: Dg Chest 2 View  Result Date:  05/08/2018 CLINICAL DATA:  85 year old male with a history of nausea and shaking EXAM: CHEST - 2 VIEW COMPARISON:  01/03/2018 FINDINGS: Cardiomediastinal silhouette unchanged in size and contour. No evidence of central vascular congestion. No interlobular septal thickening. No pneumothorax or pleural effusion. Coarsened interstitial markings. No confluent airspace disease. No displaced fracture. IMPRESSION: Negative for acute cardiopulmonary disease, with background of chronic lung changes. Electronically Signed   By: Corrie Mckusick D.O.   On: 05/08/2018 12:16   Mr 3d Recon At Scanner  Result Date: 05/13/2018 CLINICAL DATA:  Jaundice and abdominal pain. EXAM: MRI ABDOMEN WITH CONTRAST (WITH MRCP) TECHNIQUE: Multiplanar multisequence MR imaging of the abdomen was performed following the administration of intravenous contrast. Heavily T2-weighted images of the biliary and pancreatic ducts were obtained, and three-dimensional MRCP images were rendered by post processing. CONTRAST:  7 cc Gadavist COMPARISON:  03/06/2018 FINDINGS: Significantly diminished exam detail secondary to motion artifact Lower chest: Small bilateral pleural effusions. Hepatobiliary: Mild diffuse hepatic steatosis. Small cyst within segment 8 measures 8 mm, image  28/8. No suspicious liver abnormality identified. No gallstones. No biliary ductal dilatation. The common bile duct has a normal caliber. Within the limitations of motion artifact no definite evidence for choledocholithiasis identified. Pancreas: There is diffuse volume loss from the neck through tail of pancreas. No main duct dilatation, inflammation or mass identified. Spleen:  Within normal limits in size and appearance. Adrenals/Urinary Tract: The adrenal glands appear normal. Simple appearing cyst arising from the posterolateral cortex of right kidney measures 2.3 cm, image 54/8. Cyst within the anterior cortex of the left mid kidney measures 1.4 cm. No enhancing mass or  hydronephrosis identified within the limitations of motion artifact. Stomach/Bowel: Small duodenal diverticulum noted. Visualized portions of the small and large bowel loops within the abdomen are otherwise unremarkable. Vascular/Lymphatic: Normal appearance of the abdominal aorta. No aneurysm. The portal vein and hepatic veins are patent. No adenopathy. Other:  No free fluid or fluid collections. Musculoskeletal: No suspicious bone lesions identified. IMPRESSION: 1. No evidence for biliary obstruction to explain patient's jaundice. 2. Mild hepatic steatosis. 3. Kidney and liver cysts. Electronically Signed   By: Kerby Moors M.D.   On: 05/13/2018 15:38   Mr Abdomen With Mrcp W Contrast  Result Date: 05/13/2018 CLINICAL DATA:  Jaundice and abdominal pain. EXAM: MRI ABDOMEN WITH CONTRAST (WITH MRCP) TECHNIQUE: Multiplanar multisequence MR imaging of the abdomen was performed following the administration of intravenous contrast. Heavily T2-weighted images of the biliary and pancreatic ducts were obtained, and three-dimensional MRCP images were rendered by post processing. CONTRAST:  7 cc Gadavist COMPARISON:  03/06/2018 FINDINGS: Significantly diminished exam detail secondary to motion artifact Lower chest: Small bilateral pleural effusions. Hepatobiliary: Mild diffuse hepatic steatosis. Small cyst within segment 8 measures 8 mm, image 28/8. No suspicious liver abnormality identified. No gallstones. No biliary ductal dilatation. The common bile duct has a normal caliber. Within the limitations of motion artifact no definite evidence for choledocholithiasis identified. Pancreas: There is diffuse volume loss from the neck through tail of pancreas. No main duct dilatation, inflammation or mass identified. Spleen:  Within normal limits in size and appearance. Adrenals/Urinary Tract: The adrenal glands appear normal. Simple appearing cyst arising from the posterolateral cortex of right kidney measures 2.3 cm, image  54/8. Cyst within the anterior cortex of the left mid kidney measures 1.4 cm. No enhancing mass or hydronephrosis identified within the limitations of motion artifact. Stomach/Bowel: Small duodenal diverticulum noted. Visualized portions of the small and large bowel loops within the abdomen are otherwise unremarkable. Vascular/Lymphatic: Normal appearance of the abdominal aorta. No aneurysm. The portal vein and hepatic veins are patent. No adenopathy. Other:  No free fluid or fluid collections. Musculoskeletal: No suspicious bone lesions identified. IMPRESSION: 1. No evidence for biliary obstruction to explain patient's jaundice. 2. Mild hepatic steatosis. 3. Kidney and liver cysts. Electronically Signed   By: Kerby Moors M.D.   On: 05/13/2018 15:38   US Abdomen Limited Ruq  Result Date: 05/13/2018 CLINICAL DATA:  RIGHT upper quadrant pain. EXAM: ULTRASOUND ABDOMEN LIMITED RIGHT UPPER QUADRANT COMPARISON:  CT abdomen and pelvis 03/06/2018. Ultrasound abdomen 02/01/2018. FINDINGS: Gallbladder: Cholelithiasis is present. Largest calculus is 3-4 mm in longest dimension. Gallbladder wall thickening is present, up to 5 mm. Negative sonographic Murphy's sign. Common bile duct: Diameter: 2.1 mm Liver: No focal lesion. Heterogeneous, mildly increased echogenicity. Portal vein is patent on color Doppler imaging with normal direction of blood flow towards the liver. Compared with previous ultrasound in December 2019, stones have developed and the gallbladder wall  is now thickened, but was previously normal. IMPRESSION: Cholelithiasis. Possible acute versus chronic cholecystitis with gallbladder wall thickening. Negative sonographic Murphy's sign. Correlate clinically. Electronically Signed   By: Staci Righter M.D.   On: 05/13/2018 09:02     Discharge Exam: Vitals:   05/14/18 0826 05/14/18 1343  BP: 130/77 136/80  Pulse: (!) 103 92  Resp: 18 17  Temp: 98.4 F (36.9 C) 98 F (36.7 C)  SpO2: 97% 99%    Vitals:   05/14/18 0537 05/14/18 0820 05/14/18 0826 05/14/18 1343  BP: 128/67  130/77 136/80  Pulse: (!) 103  (!) 103 92  Resp: 16  18 17   Temp: 97.8 F (36.6 C)  98.4 F (36.9 C) 98 F (36.7 C)  TempSrc: Oral  Oral Oral  SpO2: 100% 99% 97% 99%  Weight:      Height:        General: Pt is alert, awake, not in acute distress Cardiovascular: RRR, S1/S2 +, no rubs, no gallops Respiratory: CTA bilaterally, no wheezing, no rhonchi Abdominal: Soft, NT, ND, bowel sounds + Extremities: no edema, no cyanosis    The results of significant diagnostics from this hospitalization (including imaging, microbiology, ancillary and laboratory) are listed below for reference.     Microbiology: Recent Results (from the past 240 hour(s))  Urine culture     Status: None   Collection Time: 05/08/18  5:12 AM  Result Value Ref Range Status   Specimen Description   Final    URINE, CLEAN CATCH Performed at [facility], [address], [place], [place] [address]    Special Requests   Final    NONE Performed at [facility], [address] [place], [place] [address]    Culture   Final    NO GROWTH Performed at [facility], [place] [address] [place], [place] [address]    Report Status 05/09/2018 FINAL  Final  Urine Culture     Status: None   Collection Time: 05/08/18 11:22 AM  Result Value Ref Range Status   Specimen Description   Final    URINE, RANDOM Performed at [facility], [address], [place], [place] [address]    Special Requests   Final    NONE Performed at [facility], [address] [place], [place] [address]    Culture   Final    Multiple bacterial morphotypes present, none predominant. Suggest appropriate recollection if clinically indicated.   Report Status 05/09/2018 FINAL  Final  Culture, blood (Routine x 2)     Status: None   Collection Time: 05/08/18 11:52 AM  Result Value Ref Range Status   Specimen Description BLOOD  Final   Special Requests NONE  Final    Culture   Final    NO GROWTH 5 DAYS Performed at [facility], [address], [place], [place] [address]    Report Status 05/13/2018 FINAL  Final  Culture, blood (Routine x 2)     Status: Abnormal   Collection Time: 05/08/18 11:52 AM  Result Value Ref Range Status   Specimen Description   Final    BLOOD RIGHT ARM Performed at [facility], [address], [place], [place] [address]    Special Requests   Final    BOTTLES DRAWN AEROBIC AND ANAEROBIC Blood Culture adequate volume Performed at [facility], [address], [place], [place] [address]    Culture  Setup Time   Final    YEAST Gram Stain Report Called to,Read Back By  and Verified With: CULLEN,M. AT 0128 ON 05/10/2018 BY EVA AEROBIC BOTTLE ONLY Performed at [place], READ BACK BY AND VERIFIED WITH: Iran Planas [phone] FCP Performed at [facility] [address] [place], [place] [address]    Culture CANDIDA ALBICANS (A)  Final   Report Status 05/12/2018 FINAL  Final  Blood Culture (routine x 2)     Status: Abnormal (Preliminary result)   Collection Time: 05/08/18 11:52 AM  Result Value Ref Range Status   Specimen Description   Final    BLOOD LEFT HAND Performed at [facility], [address], [place], [place] [address]    Special Requests   Final    BOTTLES DRAWN AEROBIC AND ANAEROBIC Blood Culture adequate volume Performed at [facility], [address], [place], [place] [address]    Culture  Setup Time   Final    YEAST Gram Stain Report Called to,Read Back By and Verified With: [place] S. @ [id number] ON [id number] BY HENDERSON L. AEROBIC BOTTLE ONLY    Culture (A)  Final    CANDIDA ALBICANS Sent to [place] for further susceptibility testing. Performed at [facility], [place] [address], [place], [place] [address]    Report Status PENDING  Incomplete  Blood Culture (routine x 2)     Status: None   Collection Time: 05/08/18 11:52 AM  Result Value Ref Range  Status   Specimen Description BLOOD  Final   Special Requests NONE  Final   Culture   Final    NO GROWTH 5 DAYS Performed at [facility], [address], [place], [place] [address]    Report Status 05/13/2018 FINAL  Final  Blood Culture ID Panel (Reflexed)     Status: Abnormal   Collection Time: 05/08/18 11:52 AM  Result Value Ref Range Status   Enterococcus species NOT DETECTED NOT DETECTED Final   Listeria monocytogenes NOT DETECTED NOT DETECTED Final   Staphylococcus species NOT DETECTED NOT DETECTED Final   Staphylococcus aureus (BCID) NOT DETECTED NOT DETECTED Final   Streptococcus species NOT DETECTED NOT DETECTED Final   Streptococcus agalactiae NOT DETECTED NOT DETECTED Final   Streptococcus pneumoniae NOT DETECTED NOT DETECTED Final   Streptococcus pyogenes NOT DETECTED NOT DETECTED Final   Acinetobacter baumannii NOT DETECTED NOT DETECTED Final   Enterobacteriaceae species NOT DETECTED NOT DETECTED Final   Enterobacter cloacae complex NOT DETECTED NOT DETECTED Final   Escherichia coli NOT DETECTED NOT DETECTED Final   Klebsiella oxytoca NOT DETECTED NOT DETECTED Final   Klebsiella pneumoniae NOT DETECTED NOT DETECTED Final   Proteus species NOT DETECTED NOT DETECTED Final   Serratia marcescens NOT DETECTED NOT DETECTED Final   Haemophilus influenzae NOT DETECTED NOT DETECTED Final   Neisseria meningitidis NOT DETECTED NOT DETECTED Final   Pseudomonas aeruginosa NOT DETECTED NOT DETECTED Final   Candida albicans DETECTED (A) NOT DETECTED Final    Comment: CRITICAL RESULT CALLED TO, READ BACK BY AND VERIFIED WITH: PHARMD SCOTT HALL [phone] FCP    Candida glabrata NOT DETECTED NOT DETECTED Final   Candida krusei NOT DETECTED NOT DETECTED Final   Candida parapsilosis NOT DETECTED NOT DETECTED Final   Candida tropicalis NOT DETECTED NOT DETECTED Final    Comment: Performed at [facility], [address], [place], [place] [address]  Antifungal AST 9 Drug Panel      Status: None (Preliminary result)   Collection Time: 05/08/18 11:52 AM  Result  Value Ref Range Status   Organism ID, Yeast Preliminary report  Final    Comment: Specimen has been received and testing has been initiated.   Amphotericin B MIC PENDING  Incomplete   Please Note: Comment  Final    Comment: (NOTE) CLSI does not have established guidelines for interpretation of these organism-drug combinations. For research use only.    Anidulafungin MIC PENDING  Incomplete   Caspofungin MIC PENDING  Incomplete   Micafungin MIC PENDING  Incomplete   Posaconazole MIC PENDING  Incomplete   Please Note: Comment  Final    Comment: (NOTE) Results for this test are for research purposes only by the assay's manufacturer.  The performance characteristics of this product have not been established.  Results should not be used as a diagnostic procedure without confirmation of the diagnosis by another medically established diagnostic product or procedure. Performed At: [facility] [place], [place] [id number] Rush Farmer MD BJ:[id number]    Fluconazole Islt MIC PENDING  Incomplete   Flucytosine MIC PENDING  Incomplete   Itraconazole MIC PENDING  Incomplete   Voriconazole MIC PENDING  Incomplete   Source CANDIDA ALBICANS/ BLOOD  Final    Comment: Performed at [facility], [place] [address], [place], [place] [address]  Urine Culture     Status: None   Collection Time: 05/08/18  2:40 PM  Result Value Ref Range Status   Specimen Description   Final    URINE, CATHETERIZED Performed at [facility], [address] [place], [place] [address]    Special Requests   Final    Normal Performed at [facility], [address] [place], [place] [address]    Culture   Final    NO GROWTH Performed at [facility], [place] [address], [place], [place] [address]    Report Status 05/09/2018 FINAL  Final  MRSA PCR Screening     Status: None   Collection Time: 05/09/18  8:55  AM  Result Value Ref Range Status   MRSA by PCR NEGATIVE NEGATIVE Final    Comment:        The GeneXpert MRSA Assay (FDA approved for NASAL specimens only), is one component of a comprehensive MRSA colonization surveillance program. It is not intended to diagnose MRSA infection nor to guide or monitor treatment for MRSA infections. Performed at [facility], [address], [place], [place] [address]   Culture, blood (Routine X 2) w Reflex to ID Panel     Status: None (Preliminary result)   Collection Time: 05/10/18  9:26 AM  Result Value Ref Range Status   Specimen Description   Final    BLOOD LEFT HAND BOTTLES DRAWN AEROBIC AND ANAEROBIC   Special Requests Blood Culture adequate volume  Final   Culture   Final    NO GROWTH 4 DAYS Performed at [facility], [address], [place], [place] [address]    Report Status PENDING  Incomplete  Culture, blood (Routine X 2) w Reflex to ID Panel     Status: None (Preliminary result)   Collection Time: 05/10/18  9:28 AM  Result Value Ref Range Status   Specimen Description   Final    BLOOD RIGHT HAND BOTTLES DRAWN AEROBIC AND ANAEROBIC   Special Requests Blood Culture adequate volume  Final   Culture   Final    NO GROWTH 4 DAYS Performed at [facility], [address], [place], [place] [address]    Report Status PENDING  Incomplete     Labs: BNP (last 3 results) No results for input(s): BNP in the last 8760 hours. Basic Metabolic Panel: Recent Labs  Lab 05/09/18 0549 05/10/18 0605 05/12/18 0431 05/13/18 0442 05/14/18 0440  NA 133* 132* 136 136 133*  K 2.9* 3.6 2.9* 3.7 3.5  CL 103 103 104 107 106  CO2 19* 20* 24 20* 20*  GLUCOSE 101* 66* 110* 124* 152*  BUN 16 26* 19 30* 40*  CREATININE 0.91 0.97 0.81 1.08 1.07  CALCIUM 7.8* 8.0* 8.5* 8.4* 8.6*  MG  --  1.6*  --   --   --    Liver Function Tests: Recent Labs  Lab 05/08/18 1151 05/08/18 1152 05/12/18 0431 05/13/18 0442 05/14/18 0440  AST 51* 50* 164* 103* 71*   ALT 29 30 164* 128* 109*  ALKPHOS 122 121 521* 534* 578*  BILITOT 0.4 0.6 2.5* 2.7* 2.6*  PROT 7.1 6.9 6.2* 6.2* 6.6  ALBUMIN 2.9* 2.9* 2.3* 2.2* 2.4*   Recent Labs  Lab 05/13/18 0442  LIPASE 19   No results for input(s): AMMONIA in the last 168 hours. CBC: Recent Labs  Lab 05/08/18 1151 05/08/18 1152 05/09/18 0549 05/10/18 0605 05/12/18 0431 05/13/18 0442 05/14/18 0440  WBC 12.2* 12.2* 17.4* 9.7 14.0* 16.7* 16.5*  NEUTROABS 11.7* 11.7*  --   --   --   --   --   HGB 9.4* 9.5* 10.0* 9.6* 10.0* 10.0* 10.1*  HCT 30.1* 30.2* 32.1* 30.9* 32.6* 32.4* 32.2*  MCV 82.0 81.8 82.1 81.7 80.9 81.8 81.1  PLT 354 357 307 215 223 234 331   Cardiac Enzymes: No results for input(s): CKTOTAL, CKMB, CKMBINDEX, TROPONINI in the last 168 hours. BNP: Invalid input(s): POCBNP CBG: Recent Labs  Lab 05/13/18 1111 05/13/18 1604 05/13/18 2118 05/14/18 0723 05/14/18 1111  GLUCAP 278* 283* 220* 226* 413*   D-Dimer No results for input(s): DDIMER in the last 72 hours. Hgb A1c No results for input(s): HGBA1C in the last 72 hours. Lipid Profile No results for input(s): CHOL, HDL, LDLCALC, TRIG, CHOLHDL, LDLDIRECT in the last 72 hours. Thyroid function studies No results for input(s): TSH, T4TOTAL, T3FREE, THYROIDAB in the last 72 hours.  Invalid input(s): FREET3 Anemia work up No results for input(s): VITAMINB12, FOLATE, FERRITIN, TIBC, IRON, RETICCTPCT in the last 72 hours. Urinalysis    Component Value Date/Time   COLORURINE YELLOW 05/08/2018 1440   APPEARANCEUR CLOUDY (A) 05/08/2018 1440   LABSPEC 1.006 05/08/2018 1440   PHURINE 7.0 05/08/2018 1440   GLUCOSEU NEGATIVE 05/08/2018 1440   HGBUR MODERATE (A) 05/08/2018 1440   BILIRUBINUR NEGATIVE 05/08/2018 1440   KETONESUR NEGATIVE 05/08/2018 1440   PROTEINUR 100 (A) 05/08/2018 1440   UROBILINOGEN 0.2 09/03/2013 0132   NITRITE NEGATIVE 05/08/2018 1440   LEUKOCYTESUR LARGE (A) 05/08/2018 1440   Sepsis Labs Invalid input(s):  PROCALCITONIN,  WBC,  LACTICIDVEN Microbiology Recent Results (from the past 240 hour(s))  Urine culture     Status: None   Collection Time: 05/08/18  5:12 AM  Result Value Ref Range Status   Specimen Description   Final    URINE, CLEAN CATCH Performed at [facility], [address] [place], [place] [address]    Special Requests   Final    NONE Performed at [facility], [address] [place], [place] [address]    Culture   Final    NO GROWTH Performed at [facility], [place] [address], [place], [place] [address]  Report Status 05/09/2018 FINAL  Final  Urine Culture     Status: None   Collection Time: 05/08/18 11:22 AM  Result Value Ref Range Status   Specimen Description   Final    URINE, RANDOM Performed at [facility], [address] [place], [place] [address]    Special Requests   Final    NONE Performed at [facility], [address], [place], [place] [address]    Culture   Final    Multiple bacterial morphotypes present, none predominant. Suggest appropriate recollection if clinically indicated.   Report Status 05/09/2018 FINAL  Final  Culture, blood (Routine x 2)     Status: None   Collection Time: 05/08/18 11:52 AM  Result Value Ref Range Status   Specimen Description BLOOD  Final   Special Requests NONE  Final   Culture   Final    NO GROWTH 5 DAYS Performed at [facility], [address], [place], [place] [address]    Report Status 05/13/2018 FINAL  Final  Culture, blood (Routine x 2)     Status: Abnormal   Collection Time: 05/08/18 11:52 AM  Result Value Ref Range Status   Specimen Description   Final    BLOOD RIGHT ARM Performed at [facility], [address] [place], [place] [address]    Special Requests   Final    BOTTLES DRAWN AEROBIC AND ANAEROBIC Blood Culture adequate volume Performed at [facility], [address] [place], [place] [address]    Culture  Setup Time   Final    YEAST Gram Stain Report Called to,Read Back By and Verified  With: CULLEN,M. AT 0128 ON 05/10/2018 BY EVA AEROBIC BOTTLE ONLY Performed at [facility], READ BACK BY AND VERIFIED WITH: Iran Planas [id number] FCP Performed at [facility], [place] [address], [place], [place] [address]    Culture CANDIDA ALBICANS (A)  Final   Report Status 05/12/2018 FINAL  Final  Blood Culture (routine x 2)     Status: Abnormal (Preliminary result)   Collection Time: 05/08/18 11:52 AM  Result Value Ref Range Status   Specimen Description   Final    BLOOD LEFT HAND Performed at [facility] [address] [place], [place] [address]    Special Requests   Final    BOTTLES DRAWN AEROBIC AND ANAEROBIC Blood Culture adequate volume Performed at [facility], [address] [place], [place] [address]    Culture  Setup Time   Final    YEAST Gram Stain Report Called to,Read Back By and Verified With: Martin[place] S. @ [id number] ON [id number] BY HENDERSON L. AEROBIC BOTTLE ONLY    Culture (A)  Final    CANDIDA ALBICANS Sent to [place] for further susceptibility testing. Performed at [facility], [place] [address] [place], [place] [address]    Report Status PENDING  Incomplete  Blood Culture (routine x 2)     Status: None   Collection Time: 05/08/18 11:52 AM  Result Value Ref Range Status   Specimen Description BLOOD  Final   Special Requests NONE  Final   Culture   Final    NO GROWTH 5 DAYS Performed at [facility], [address], [place],  [address]    Report Status 05/13/2018 FINAL  Final  Blood Culture ID Panel (Reflexed)     Status: Abnormal   Collection Time: 05/08/18 11:52 AM  Result Value Ref Range Status   Enterococcus  species NOT DETECTED NOT DETECTED Final   Listeria monocytogenes NOT DETECTED NOT DETECTED Final   Staphylococcus species NOT DETECTED NOT DETECTED Final   Staphylococcus aureus (BCID) NOT DETECTED NOT DETECTED Final   Streptococcus species NOT DETECTED NOT DETECTED Final   Streptococcus  agalactiae NOT DETECTED NOT DETECTED Final   Streptococcus pneumoniae NOT DETECTED NOT DETECTED Final   Streptococcus pyogenes NOT DETECTED NOT DETECTED Final   Acinetobacter baumannii NOT DETECTED NOT DETECTED Final   Enterobacteriaceae species NOT DETECTED NOT DETECTED Final   Enterobacter cloacae complex NOT DETECTED NOT DETECTED Final   Escherichia coli NOT DETECTED NOT DETECTED Final   Klebsiella oxytoca NOT DETECTED NOT DETECTED Final   Klebsiella pneumoniae NOT DETECTED NOT DETECTED Final   Proteus species NOT DETECTED NOT DETECTED Final   Serratia marcescens NOT DETECTED NOT DETECTED Final   Haemophilus influenzae NOT DETECTED NOT DETECTED Final   Neisseria meningitidis NOT DETECTED NOT DETECTED Final   Pseudomonas aeruginosa NOT DETECTED NOT DETECTED Final   Candida albicans DETECTED (A) NOT DETECTED Final    Comment: CRITICAL RESULT CALLED TO, READ BACK BY AND VERIFIED WITH: PHARMD SCOTT HALL [id number] FCP    Candida glabrata NOT DETECTED NOT DETECTED Final   Candida krusei NOT DETECTED NOT DETECTED Final   Candida parapsilosis NOT DETECTED NOT DETECTED Final   Candida tropicalis NOT DETECTED NOT DETECTED Final    Comment: Performed at [facility], [address], [place], [place] [address]  Antifungal AST 9 Drug Panel     Status: None (Preliminary result)   Collection Time: 05/08/18 11:52 AM  Result Value Ref Range Status   Organism ID, Yeast Preliminary report  Final    Comment: Specimen has been received and testing has been initiated.   Amphotericin B MIC PENDING  Incomplete   Please Note: Comment  Final    Comment: (NOTE) CLSI does not have established guidelines for interpretation of these organism-drug combinations. For research use only.    Anidulafungin MIC PENDING  Incomplete   Caspofungin MIC PENDING  Incomplete   Micafungin MIC PENDING  Incomplete   Posaconazole MIC PENDING  Incomplete   Please Note: Comment  Final    Comment: (NOTE) Results for  this test are for research purposes only by the assay's manufacturer.  The performance characteristics of this product have not been established.  Results should not be used as a diagnostic procedure without confirmation of the diagnosis by another medically established diagnostic product or procedure. Performed At: [facility] [place], [place] [id number] Rush Farmer MD GG:[id number]    Fluconazole Islt MIC PENDING  Incomplete   Flucytosine MIC PENDING  Incomplete   Itraconazole MIC PENDING  Incomplete   Voriconazole MIC PENDING  Incomplete   Source CANDIDA ALBICANS/ BLOOD  Final    Comment: Performed at [facility], [place] [address], [place], [place] [address]  Urine Culture     Status: None   Collection Time: 05/08/18  2:40 PM  Result Value Ref Range Status   Specimen Description   Final    URINE, CATHETERIZED Performed at [facility], [address], [place], [place] [address]    Special Requests   Final    Normal Performed at [facility], [address], [place], [place] [address]    Culture   Final    NO GROWTH Performed at [facility], [place] [address] [place],  [address]    Report Status 05/09/2018 FINAL  Final  MRSA PCR Screening     Status: None   Collection Time: 05/09/18  8:55 AM  Result Value Ref Range Status   MRSA by PCR NEGATIVE NEGATIVE Final    Comment:        The GeneXpert MRSA Assay (FDA approved for NASAL specimens only), is one component of a comprehensive MRSA colonization surveillance program. It is not intended to diagnose MRSA infection nor to guide or monitor treatment for MRSA infections. Performed at [facility], [address], [place], [place] [address]   Culture, blood (Routine X 2) w Reflex to ID Panel     Status: None (Preliminary result)   Collection Time: 05/10/18  9:26 AM  Result Value Ref Range Status   Specimen Description   Final    BLOOD LEFT HAND BOTTLES DRAWN AEROBIC AND ANAEROBIC    Special Requests Blood Culture adequate volume  Final   Culture   Final    NO GROWTH 4 DAYS Performed at [facility], [address] [place], [place] [address]    Report Status PENDING  Incomplete  Culture, blood (Routine X 2) w Reflex to ID Panel     Status: None (Preliminary result)   Collection Time: 05/10/18  9:28 AM  Result Value Ref Range Status   Specimen Description   Final    BLOOD RIGHT HAND BOTTLES DRAWN AEROBIC AND ANAEROBIC   Special Requests Blood Culture adequate volume  Final   Culture   Final    NO GROWTH 4 DAYS Performed at [facility], [address], [place], [place] [address]    Report Status PENDING  Incomplete     Time coordinating discharge: 35 minutes  SIGNED:   Rodena Goldmann, DO [facility] 05/14/2018, 2:13 PM  If 7PM-7AM, please contact night-coverage [email or web address] Password [email or web address]

## 2018-05-14 NOTE — Progress Notes (Signed)
Called daughter [name] about patient's discharge, either her or another family member will come pick up Anthony Dunlap in the next little bit. Instructed family members to call when here so staff can wheel out patient.

## 2018-05-14 NOTE — Progress Notes (Signed)
Peripherally Inserted Central Catheter/Midline Placement  The IV Nurse has discussed with the patient and/or persons authorized to consent for the patient, the purpose of this procedure and the potential benefits and risks involved with this procedure.  The benefits include less needle sticks, lab draws from the catheter, and the patient may be discharged home with the catheter. Risks include, but not limited to, infection, bleeding, blood clot (thrombus formation), and puncture of an artery; nerve damage and irregular heartbeat and possibility to perform a PICC exchange if needed/ordered by physician.  Alternatives to this procedure were also discussed.  Bard Power PICC patient education guide, fact sheet on infection prevention and patient information card has been provided to patient /or left at bedside.    PICC/Midline Placement Documentation  PICC Single Lumen 05/14/18 PICC Right Brachial 39 cm 0 cm (Active)  Indication for Insertion or Continuance of Line Home intravenous therapies (PICC only) 05/14/2018  5:00 PM  Exposed Catheter (cm) 0 cm 05/14/2018  5:00 PM  Site Assessment Clean;Dry;Intact 05/14/2018  5:00 PM  Line Status Flushed;Saline locked;Blood return noted 05/14/2018  5:00 PM  Dressing Type Transparent;Securing device 05/14/2018  5:00 PM  Dressing Status Clean;Dry;Intact;Antimicrobial disc in place 05/14/2018  5:00 PM  Line Care Connections checked and tightened 05/14/2018  5:00 PM  Dressing Intervention New dressing 05/14/2018  5:00 PM  Dressing Change Due 05/21/18 05/14/2018  5:00 PM       Virgilio Belling 05/14/2018, 5:45 PM

## 2018-05-14 NOTE — Consult Note (Signed)
Reason for Consult: Cholelithiasis, elevated liver enzyme tests Referring Physician: Dr. [name] is an 85 y.o. male.  HPI: Patient is an 85 year old white male who has been in the hospital with fungemia secondary to candidiasis.  This appeared to come from significant perineal skin rash.  Also significance is he has chronic urinary retention with a chronic indwelling Foley catheter due to prostate cancer.  He was initially treated with high-dose IV Diflucan, but developed transaminitis.  Subsequent ultrasound the gallbladder revealed cholelithiasis.  MRCP was performed due to increasing total bilirubin levels which showed no evidence of choledocholithiasis.  No significant pericholecystic fluid was seen.  In talking with the patient, he has no right upper quadrant abdominal pain or nausea.  He is able to eat without difficulty.  He is sitting in the chair and tolerating a carb modified diet well.  He denies a history of upper abdominal pain.  He currently has 0 out of 10 abdominal pain.  Past Medical History:  Diagnosis Date  . 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 Diabetes ([place])   . Left eye pain   . Prostate cancer [facility])     Past Surgical History:  Procedure Laterality Date  . HERNIA REPAIR    . IR RADIOLOGIST EVAL & MGMT  03/06/2018  . PROSTATE SURGERY      Family History  Problem Relation Age of Onset  . Pneumonia Father   . Cancer Sister     Social History:  reports that he has never smoked. He has never used smokeless tobacco. He reports that he does not drink alcohol or use drugs.  Allergies: No Known Allergies  Medications: I have reviewed the patient's current medications.  Results for orders placed or performed during the hospital encounter of 05/08/18 (from the past 48 hour(s))  Glucose, capillary     Status: Abnormal   Collection Time: 05/12/18 11:15 AM  Result Value Ref Range   Glucose-Capillary 240 (H) 70 - 99 mg/dL   Glucose, capillary     Status: Abnormal   Collection Time: 05/12/18  4:08 PM  Result Value Ref Range   Glucose-Capillary 161 (H) 70 - 99 mg/dL  Glucose, capillary     Status: Abnormal   Collection Time: 05/12/18  8:22 PM  Result Value Ref Range   Glucose-Capillary 107 (H) 70 - 99 mg/dL  Glucose, capillary     Status: Abnormal   Collection Time: 05/12/18 10:18 PM  Result Value Ref Range   Glucose-Capillary 57 (L) 70 - 99 mg/dL  Glucose, capillary     Status: None   Collection Time: 05/12/18 10:42 PM  Result Value Ref Range   Glucose-Capillary 77 70 - 99 mg/dL  Glucose, capillary     Status: Abnormal   Collection Time: 05/12/18 11:16 PM  Result Value Ref Range   Glucose-Capillary 111 (H) 70 - 99 mg/dL  Comprehensive metabolic panel     Status: Abnormal   Collection Time: 05/13/18  4:42 AM  Result Value Ref Range   Sodium 136 135 - 145 mmol/L   Potassium 3.7 3.5 - 5.1 mmol/L    Comment: DELTA CHECK NOTED   Chloride 107 98 - 111 mmol/L   CO2 20 (L) 22 - 32 mmol/L   Glucose, Bld 124 (H) 70 - 99 mg/dL   BUN 30 (H) 8 - 23 mg/dL   Creatinine, Ser 1.08 0.61 - 1.24 mg/dL   Calcium 8.4 (L) 8.9 - 10.3 mg/dL  Total Protein 6.2 (L) 6.5 - 8.1 g/dL   Albumin 2.2 (L) 3.5 - 5.0 g/dL   AST 103 (H) 15 - 41 U/L   ALT 128 (H) 0 - 44 U/L   Alkaline Phosphatase 534 (H) 38 - 126 U/L   Total Bilirubin 2.7 (H) 0.3 - 1.2 mg/dL   GFR calc non Af Amer >60 >60 mL/min   GFR calc Af Amer >60 >60 mL/min   Anion gap 9 5 - 15    Comment: Performed at [facility], [address], [place], [place] [address]  CBC     Status: Abnormal   Collection Time: 05/13/18  4:42 AM  Result Value Ref Range   WBC 16.7 (H) 4.0 - 10.5 K/uL   RBC 3.96 (L) 4.22 - 5.81 MIL/uL   Hemoglobin 10.0 (L) 13.0 - 17.0 g/dL   HCT 32.4 (L) 39.0 - 52.0 %   MCV 81.8 80.0 - 100.0 fL   MCH 25.3 (L) 26.0 - 34.0 pg   MCHC 30.9 30.0 - 36.0 g/dL   RDW 14.7 11.5 - 15.5 %   Platelets 234 150 - 400 K/uL   nRBC 0.0 0.0 - 0.2 %    Comment:  Performed at [facility], [address], [place], [place] [address]  Lipase, blood     Status: None   Collection Time: 05/13/18  4:42 AM  Result Value Ref Range   Lipase 19 11 - 51 U/L    Comment: Performed at [facility], [address], [place], [place] [address]  Bilirubin, direct     Status: Abnormal   Collection Time: 05/13/18  4:42 AM  Result Value Ref Range   Bilirubin, Direct 1.9 (H) 0.0 - 0.2 mg/dL    Comment: Performed at [facility], [address], [place], [place] [address]  Glucose, capillary     Status: Abnormal   Collection Time: 05/13/18  7:30 AM  Result Value Ref Range   Glucose-Capillary 137 (H) 70 - 99 mg/dL  Glucose, capillary     Status: Abnormal   Collection Time: 05/13/18 11:11 AM  Result Value Ref Range   Glucose-Capillary 278 (H) 70 - 99 mg/dL  Glucose, capillary     Status: Abnormal   Collection Time: 05/13/18  4:04 PM  Result Value Ref Range   Glucose-Capillary 283 (H) 70 - 99 mg/dL  Glucose, capillary     Status: Abnormal   Collection Time: 05/13/18  9:18 PM  Result Value Ref Range   Glucose-Capillary 220 (H) 70 - 99 mg/dL  Comprehensive metabolic panel     Status: Abnormal   Collection Time: 05/14/18  4:40 AM  Result Value Ref Range   Sodium 133 (L) 135 - 145 mmol/L   Potassium 3.5 3.5 - 5.1 mmol/L   Chloride 106 98 - 111 mmol/L   CO2 20 (L) 22 - 32 mmol/L   Glucose, Bld 152 (H) 70 - 99 mg/dL   BUN 40 (H) 8 - 23 mg/dL   Creatinine, Ser 1.07 0.61 - 1.24 mg/dL   Calcium 8.6 (L) 8.9 - 10.3 mg/dL   Total Protein 6.6 6.5 - 8.1 g/dL   Albumin 2.4 (L) 3.5 - 5.0 g/dL   AST 71 (H) 15 - 41 U/L   ALT 109 (H) 0 - 44 U/L   Alkaline Phosphatase 578 (H) 38 - 126 U/L   Total Bilirubin 2.6 (H) 0.3 - 1.2 mg/dL   GFR calc non Af Amer >60 >60 mL/min   GFR calc Af Amer >  60 >60 mL/min   Anion gap 7 5 - 15    Comment: Performed at [facility], [address] [place], [place] [address]  CBC     Status: Abnormal   Collection Time: 05/14/18  4:40 AM  Result  Value Ref Range   WBC 16.5 (H) 4.0 - 10.5 K/uL   RBC 3.97 (L) 4.22 - 5.81 MIL/uL   Hemoglobin 10.1 (L) 13.0 - 17.0 g/dL   HCT 32.2 (L) 39.0 - 52.0 %   MCV 81.1 80.0 - 100.0 fL   MCH 25.4 (L) 26.0 - 34.0 pg   MCHC 31.4 30.0 - 36.0 g/dL   RDW 15.2 11.5 - 15.5 %   Platelets 331 150 - 400 K/uL   nRBC 0.0 0.0 - 0.2 %    Comment: Performed at [facility], [address] [place], [place] [address]  Glucose, capillary     Status: Abnormal   Collection Time: 05/14/18  7:23 AM  Result Value Ref Range   Glucose-Capillary 226 (H) 70 - 99 mg/dL    Mr 3d Recon At Scanner  Result Date: 05/13/2018 CLINICAL DATA:  Jaundice and abdominal pain. EXAM: MRI ABDOMEN WITH CONTRAST (WITH MRCP) TECHNIQUE: Multiplanar multisequence MR imaging of the abdomen was performed following the administration of intravenous contrast. Heavily T2-weighted images of the biliary and pancreatic ducts were obtained, and three-dimensional MRCP images were rendered by post processing. CONTRAST:  7 cc Gadavist COMPARISON:  03/06/2018 FINDINGS: Significantly diminished exam detail secondary to motion artifact Lower chest: Small bilateral pleural effusions. Hepatobiliary: Mild diffuse hepatic steatosis. Small cyst within segment 8 measures 8 mm, image 28/8. No suspicious liver abnormality identified. No gallstones. No biliary ductal dilatation. The common bile duct has a normal caliber. Within the limitations of motion artifact no definite evidence for choledocholithiasis identified. Pancreas: There is diffuse volume loss from the neck through tail of pancreas. No main duct dilatation, inflammation or mass identified. Spleen:  Within normal limits in size and appearance. Adrenals/Urinary Tract: The adrenal glands appear normal. Simple appearing cyst arising from the posterolateral cortex of right kidney measures 2.3 cm, image 54/8. Cyst within the anterior cortex of the left mid kidney measures 1.4 cm. No enhancing mass or hydronephrosis  identified within the limitations of motion artifact. Stomach/Bowel: Small duodenal diverticulum noted. Visualized portions of the small and large bowel loops within the abdomen are otherwise unremarkable. Vascular/Lymphatic: Normal appearance of the abdominal aorta. No aneurysm. The portal vein and hepatic veins are patent. No adenopathy. Other:  No free fluid or fluid collections. Musculoskeletal: No suspicious bone lesions identified. IMPRESSION: 1. No evidence for biliary obstruction to explain patient's jaundice. 2. Mild hepatic steatosis. 3. Kidney and liver cysts. Electronically Signed   By: Kerby Moors M.D.   On: 05/13/2018 15:38   Mr Abdomen With Mrcp W Contrast  Result Date: 05/13/2018 CLINICAL DATA:  Jaundice and abdominal pain. EXAM: MRI ABDOMEN WITH CONTRAST (WITH MRCP) TECHNIQUE: Multiplanar multisequence MR imaging of the abdomen was performed following the administration of intravenous contrast. Heavily T2-weighted images of the biliary and pancreatic ducts were obtained, and three-dimensional MRCP images were rendered by post processing. CONTRAST:  7 cc Gadavist COMPARISON:  03/06/2018 FINDINGS: Significantly diminished exam detail secondary to motion artifact Lower chest: Small bilateral pleural effusions. Hepatobiliary: Mild diffuse hepatic steatosis. Small cyst within segment 8 measures 8 mm, image 28/8. No suspicious liver abnormality identified. No gallstones. No biliary ductal dilatation. The common bile duct has a normal caliber. Within the limitations of motion  artifact no definite evidence for choledocholithiasis identified. Pancreas: There is diffuse volume loss from the neck through tail of pancreas. No main duct dilatation, inflammation or mass identified. Spleen:  Within normal limits in size and appearance. Adrenals/Urinary Tract: The adrenal glands appear normal. Simple appearing cyst arising from the posterolateral cortex of right kidney measures 2.3 cm, image 54/8. Cyst  within the anterior cortex of the left mid kidney measures 1.4 cm. No enhancing mass or hydronephrosis identified within the limitations of motion artifact. Stomach/Bowel: Small duodenal diverticulum noted. Visualized portions of the small and large bowel loops within the abdomen are otherwise unremarkable. Vascular/Lymphatic: Normal appearance of the abdominal aorta. No aneurysm. The portal vein and hepatic veins are patent. No adenopathy. Other:  No free fluid or fluid collections. Musculoskeletal: No suspicious bone lesions identified. IMPRESSION: 1. No evidence for biliary obstruction to explain patient's jaundice. 2. Mild hepatic steatosis. 3. Kidney and liver cysts. Electronically Signed   By: Kerby Moors M.D.   On: 05/13/2018 15:38   US Abdomen Limited Ruq  Result Date: 05/13/2018 CLINICAL DATA:  RIGHT upper quadrant pain. EXAM: ULTRASOUND ABDOMEN LIMITED RIGHT UPPER QUADRANT COMPARISON:  CT abdomen and pelvis 03/06/2018. Ultrasound abdomen 02/01/2018. FINDINGS: Gallbladder: Cholelithiasis is present. Largest calculus is 3-4 mm in longest dimension. Gallbladder wall thickening is present, up to 5 mm. Negative sonographic Murphy's sign. Common bile duct: Diameter: 2.1 mm Liver: No focal lesion. Heterogeneous, mildly increased echogenicity. Portal vein is patent on color Doppler imaging with normal direction of blood flow towards the liver. Compared with previous ultrasound in December 2019, stones have developed and the gallbladder wall is now thickened, but was previously normal. IMPRESSION: Cholelithiasis. Possible acute versus chronic cholecystitis with gallbladder wall thickening. Negative sonographic Murphy's sign. Correlate clinically. Electronically Signed   By: Staci Righter M.D.   On: 05/13/2018 09:02    ROS:  Pertinent items are noted in HPI.  Blood pressure 130/77, pulse (!) 103, temperature 98.4 F (36.9 C), temperature source Oral, resp. rate 18, height 5\' 9"  (1.753 m), weight 68.7  kg, SpO2 97 %. Physical Exam: Pleasant well-developed well-nourished white male no acute distress sitting in the chair. HEENT examination reveals no scleral icterus.  Patient is normocephalic, atraumatic Lungs are clear to auscultation with good breath sounds bilaterally Heart examination reveals a regular rate and rhythm without S3, S4, murmurs Abdomen is soft, nontender, nondistended.  No hepatosplenomegaly or masses are noted.  No rigidity is noted.  Ultrasound and MRCP results reviewed.  Labs reviewed.  Assessment/Plan: Impression: Doubt acute cholecystitis.  His transaminitis is most likely secondary to IV Diflucan which has been stopped.  He does have a leukocytosis, which may be related to him Rocephin being stopped.  It has been restarted.  He has no clinical findings of cholecystitis.  I would be hesitant to perform any surgery in the face of recent fungemia.  Will follow with you peripherally.  At this point, I do not feel a HIDA scan is indicated as the patient is asymptomatic.  Aviva Signs 05/14/2018, 11:04 AM

## 2018-05-15 LAB — ANTIFUNGAL AST 9 DRUG PANEL
Amphotericin B MIC: 0.5
Fluconazole Islt MIC: 0.5
Flucytosine MIC: 0.06
Itraconazole MIC: 0.12
Posaconazole MIC: 0.06

## 2018-05-15 LAB — CULTURE, BLOOD (ROUTINE X 2)
Culture: NO GROWTH
Culture: NO GROWTH
Special Requests: ADEQUATE
Special Requests: ADEQUATE

## 2018-05-20 LAB — CULTURE, BLOOD (ROUTINE X 2): Special Requests: ADEQUATE

## 2018-05-27 ENCOUNTER — Ambulatory Visit: Payer: Medicare Other | Admitting: Urology

## 2018-06-05 ENCOUNTER — Other Ambulatory Visit: Payer: Self-pay

## 2018-06-05 ENCOUNTER — Encounter: Payer: Self-pay | Admitting: Infectious Disease

## 2018-06-05 ENCOUNTER — Ambulatory Visit (INDEPENDENT_AMBULATORY_CARE_PROVIDER_SITE_OTHER): Payer: Medicare Other | Admitting: Infectious Disease

## 2018-06-05 DIAGNOSIS — A414 Sepsis due to anaerobes: Secondary | ICD-10-CM | POA: Diagnosis not present

## 2018-06-05 DIAGNOSIS — B952 Enterococcus as the cause of diseases classified elsewhere: Secondary | ICD-10-CM | POA: Diagnosis not present

## 2018-06-05 DIAGNOSIS — K716 Toxic liver disease with hepatitis, not elsewhere classified: Secondary | ICD-10-CM

## 2018-06-05 DIAGNOSIS — B49 Unspecified mycosis: Secondary | ICD-10-CM

## 2018-06-05 DIAGNOSIS — K6812 Psoas muscle abscess: Secondary | ICD-10-CM

## 2018-06-05 DIAGNOSIS — M86651 Other chronic osteomyelitis, right thigh: Secondary | ICD-10-CM

## 2018-06-05 DIAGNOSIS — T50905A Adverse effect of unspecified drugs, medicaments and biological substances, initial encounter: Secondary | ICD-10-CM | POA: Insufficient documentation

## 2018-06-05 HISTORY — DX: Adverse effect of unspecified drugs, medicaments and biological substances, initial encounter: T50.905A

## 2018-06-05 HISTORY — DX: Toxic liver disease with hepatitis, not elsewhere classified: K71.6

## 2018-06-05 NOTE — Progress Notes (Signed)
Virtual Visit via Telephone Note  I connected with Anthony Dunlap on 06/05/18 at 10:45 AM EDT by telephone and verified that I am speaking with the correct person using two identifiers.   I discussed the limitations, risks, security and privacy concerns of performing an evaluation and management service by telephone and the availability of in person appointments. I also discussed with the patient that there may be a patient responsible charge related to this service. The patient expressed understanding and agreed to proceed.   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  He is currently residing in a skilled nursing facility and on Zosyn rather than Unasyn.  I do not know why this change was made as his microbes were covered by the Unasyn.  In any case the abscesses have resolved radiographically and the drains been pulled by interventional radiology.  He did well since I last saw him in March 2020, but then was admitted to the hospital with severe candidal infection with intertrigo, and diarrhea with the catheter in place and candidemia.  He was treated with fluconazole but then developed hepatitis and was changed over to Eraxis and completed a two-week course.  He was not formally seen by an ophthalmologist in the hospital nor afterwards in the clinic.  He denies having any visual changes other than occasionally having itching eyes.  He is currently sheltering in place at his home with his wife, son( who also got on the phone call with dad) and only being visited by 2 other people who he employs to help take care of himself and his wife.  He is doing well and has no complaints.  He is able to walk around without hip  pain.   Observations/Objective:  Anthony Dunlap appears to be doing well having recovered from his candidemia.  He ideally should have had a dilated funduscopic exam but given he has no problems with his vision at present I do not think it is urgent to have him come into the clinic to be observed especially as were several weeks out now from his treatment.  His pelvic osteomyelitis seems to been treated properly though he still have some anxiety about it he sounds to be doing well clinically.  Assessment and Plan: Candidemia: Completed 2 weeks of therapy that was complicated by a Zoll induced hepatitis  Drug-induced hepatitis seem to be resolving off of fluconazole  Pelvic osteomyelitis: Completed successful treatment still we will monitor.  Prevention of coronavirus 2019: Patient and son counseled with regards to how to best shelter in place.  Itchy eyes: Sounds to be due to allergies.  Follow Up Instructions:  We will visit with me via E visit in the next 2 months.  I have made an appointment for him.   I discussed the assessment and treatment plan with the patient. The patient was provided an opportunity to ask questions and all were answered. The patient agreed with the plan and demonstrated an understanding of the instructions.   The patient was advised to call back or seek an in-person evaluation if the symptoms worsen or if the condition fails to improve as anticipated.  I provided 22 minutes of non-face-to-face time during this encounter.   Rhina Brackett  Dam, MD

## 2018-06-24 ENCOUNTER — Other Ambulatory Visit (HOSPITAL_COMMUNITY)
Admission: AD | Admit: 2018-06-24 | Discharge: 2018-06-24 | Disposition: A | Discharge status: Home / Self Care | Payer: Medicare Other | Source: Skilled Nursing Facility | Attending: Internal Medicine | Admitting: Internal Medicine

## 2018-06-24 DIAGNOSIS — Z029 Encounter for administrative examinations, unspecified: Secondary | ICD-10-CM | POA: Insufficient documentation

## 2018-06-24 LAB — URINALYSIS, COMPLETE (UACMP) WITH MICROSCOPIC
Bilirubin Urine: NEGATIVE
Glucose, UA: NEGATIVE mg/dL
Ketones, ur: NEGATIVE mg/dL
Nitrite: NEGATIVE
Protein, ur: NEGATIVE mg/dL
Specific Gravity, Urine: 1.008 (ref 1.005–1.030)
WBC, UA: >50 WBC/hpf — ABNORMAL HIGH (ref 0–5)
pH: 7 (ref 5.0–8.0)

## 2018-06-26 LAB — URINE CULTURE

## 2018-08-04 ENCOUNTER — Other Ambulatory Visit: Payer: Self-pay

## 2018-08-04 ENCOUNTER — Ambulatory Visit (INDEPENDENT_AMBULATORY_CARE_PROVIDER_SITE_OTHER): Payer: Medicare Other | Admitting: Infectious Disease

## 2018-08-04 DIAGNOSIS — B49 Unspecified mycosis: Secondary | ICD-10-CM | POA: Diagnosis not present

## 2018-08-04 DIAGNOSIS — M86652 Other chronic osteomyelitis, left thigh: Secondary | ICD-10-CM

## 2018-08-04 DIAGNOSIS — T50905A Adverse effect of unspecified drugs, medicaments and biological substances, initial encounter: Secondary | ICD-10-CM

## 2018-08-04 DIAGNOSIS — K716 Toxic liver disease with hepatitis, not elsewhere classified: Secondary | ICD-10-CM

## 2018-08-04 DIAGNOSIS — A414 Sepsis due to anaerobes: Secondary | ICD-10-CM | POA: Diagnosis not present

## 2018-08-04 DIAGNOSIS — B952 Enterococcus as the cause of diseases classified elsewhere: Secondary | ICD-10-CM

## 2018-08-04 NOTE — Progress Notes (Signed)
Virtual Visit via Telephone Note  I connected with Anthony Dunlap on 08/04/18 at 10:30 AM EDT by telephone and verified that I am speaking with the correct person using two identifiers.   I discussed the limitations, risks, security and privacy concerns of performing an evaluation and management service by telephone and the availability of in person appointments. I also discussed with the patient that there may be a patient responsible charge related to this service. The patient expressed understanding and agreed to proceed.   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  He is currently residing in a skilled nursing facility and on Zosyn rather than Unasyn.  I do not know why this change was made as his microbes were covered by the Unasyn.  In any case the abscesses have resolved radiographically and the drains been pulled by interventional radiology.  He did well since I last saw him in March 2020, but then was admitted to the hospital with severe candidal infection with intertrigo, and diarrhea with the catheter in place and candidemia.  He was treated with fluconazole but then developed hepatitis and was changed over to Eraxis and completed a two-week course.  He was not formally seen by an ophthalmologist in the hospital nor afterwards in the clinic.  He denied having any visual changes other than occasionally having itching eyes.  He is currently sheltering in place at his home with his wife, son( who also got on the phone call with dad) and only being visited by 2 other people who he employs to help take care of himself and his wife.  Last saw him he was doing better and was able to walk around without much hip pain  Today  however when I called he is indeed having more hip pain with ambulation  He is not having nausea fever or systemic symptoms.  I asked him if he could make an appointment in person which we have done so so I can take a look at him and examine him in person and examine his hip joint and recheck his inflammatory markers.  I suspect we are going to need repeat imaging with an MRI and he may indeed need help from orthopedic surgery   He is able to walk around without hip pain.   Observations/Objective: Pelvic osteomyelitis:  Candidemia  Drug-induced hepatitis  Assessment and Plan:  Pelvic osteomyelitis I am worried this is recurred we will bring him to clinic in person and recheck him and recheck labs, likely repeat imaging with MRI  Candidemia: May check surveillance blood cultures  Drug-induced hepatitis is resolved   Prevention of coronavirus 2019: Patient and son counseled with regards to how to best shelter in place.  Itchy eyes: Sounds to be due to allergies.  Follow Up Instructions:   I discussed the assessment and treatment plan with the patient. The patient was provided an opportunity to ask questions and all were answered. The patient agreed with the plan and demonstrated an understanding of the instructions.   The patient was advised to call back or seek an in-person evaluation if the symptoms worsen or if the condition fails to improve as anticipated.  I provided 15 minutes of non-face-to-face time during this encounter.  Alcide Evener, MD

## 2018-08-12 ENCOUNTER — Ambulatory Visit: Payer: Medicare Other | Admitting: Infectious Disease

## 2018-08-12 ENCOUNTER — Other Ambulatory Visit: Payer: Self-pay

## 2018-08-12 VITALS — BP 129/72 | HR 103 | Temp 98.3°F | Wt 160.0 lb

## 2018-08-12 DIAGNOSIS — B49 Unspecified mycosis: Secondary | ICD-10-CM

## 2018-08-12 DIAGNOSIS — B952 Enterococcus as the cause of diseases classified elsewhere: Secondary | ICD-10-CM | POA: Diagnosis not present

## 2018-08-12 DIAGNOSIS — M86652 Other chronic osteomyelitis, left thigh: Secondary | ICD-10-CM

## 2018-08-12 DIAGNOSIS — A414 Sepsis due to anaerobes: Secondary | ICD-10-CM | POA: Diagnosis not present

## 2018-08-12 MED ORDER — NYSTATIN 100000 UNIT/GM EX POWD: Freq: Four times a day (QID) | CUTANEOUS | 5 refills | Status: AC

## 2018-08-12 NOTE — Progress Notes (Signed)
Subjective:   Complaint: Right-sided hip pain in particular with walking and also sleeping   Patient ID: Anthony Dunlap, male    DOB: 19-Apr-1933, 85 y.o.   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 He is currently residing in a skilled nursing facility and on Zosyn rather than Unasyn. I do not know why this change was made as his microbes were covered by the Unasyn. In any case the abscesses have resolved radiographically and the drains been pulled by interventional radiology.  He did well since I last saw him in March 2020, but then was admitted to the hospital with severe candidal infection with intertrigo, and diarrhea with the catheter in place and candidemia.  He was treated with fluconazole but then developed hepatitis and was changed over to Eraxis and completed a two-week course.  He was not formally seen by an ophthalmologist in the hospital nor afterwards in the clinic.  He denied having any visual changes other than occasionally having itching eyes.  He continues sheltering in place at his home with his wife, son( who also got on the phone call with dad) and only being visited by 2 other people who he employs to help take care of himself and his wife.  I had last physically seen him saw him he was doing better and was able to walk around without much hip pain  However when I conducted a telephone visit with him on June 15 of 2020 he was experiencing worsening right-sided hip pain with ambulation and with times different positions in his bed.  He tells me today that he associates this with having undergone some physical therapy with some external and internal rotation of his hip joint.  I am concerned that he may have recurrence of  his infection and I brought him in today for an exam and check labs.  He also has been experiencing intertrigo again.  It sounds as if he is being treated for asymptomatic pyuria which I do not think is a good idea given how bad his prior candidemia was.   Past Medical History:  Diagnosis Date  . 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 Diabetes ([place])   . Drug-induced hepatitis 06/05/2018  . Left eye pain   . Prostate cancer [facility])     Past Surgical History:  Procedure Laterality Date  . HERNIA REPAIR    . IR RADIOLOGIST EVAL & MGMT  03/06/2018  . PROSTATE SURGERY      Family History  Problem Relation Age of Onset  . Pneumonia Father   . Cancer Sister       Social History   Socioeconomic History  . Marital status: Married    Spouse name: [name]  . Number of children: 3  . Years of education: 9th  . Highest education level: Not on file  Occupational History    Employer: RETIRED    Comment: Retired  Scientific laboratory technician  . Financial resource strain: Not on file  . Food insecurity    Worry: Not on file    Inability: Not on file  . Transportation needs    Medical: Not on file    Non-medical: Not on file  Tobacco Use  . Smoking status: Never Smoker  .  Smokeless tobacco: Never Used  Substance and Sexual Activity  . Alcohol use: No  . Drug use: No  . Sexual activity: Not on file  Lifestyle  . Physical activity    Days per week: Not on file    Minutes per session: Not on file  . Stress: Not on file  Relationships  . Social Herbalist on phone: Not on file    Gets together: Not on file    Attends religious service: Not on file    Active member of club or organization: Not on file    Attends meetings of clubs or organizations: Not on file    Relationship status: Not on file  Other Topics Concern  . Not on file  Social History Narrative   Patient lives at home with his wife. [name]) . Patient is retired.   Education 9th  grade.   Right handed.   Caffeine None    Allergies  Allergen Reactions  . Fluconazole Other (See Comments)    Drug-induced hepatitis     Current Outpatient Medications:  .  acetaminophen (TYLENOL) 325 MG tablet, Take 2 tablets (650 mg total) by mouth every 6 (six) hours as needed for mild pain, fever or headache., Disp: , Rfl:  .  aspirin EC 81 MG tablet, Take 81 mg by mouth daily., Disp: , Rfl:  .  ferrous sulfate 325 (65 FE) MG tablet, Take 1 tablet (325 mg total) by mouth daily with breakfast., Disp: , Rfl: 3 .  glimepiride (AMARYL) 4 MG tablet, Take 1 tablet by mouth daily., Disp: , Rfl:  .  insulin aspart (NOVOLOG) 100 UNIT/ML injection, Inject 0-15 Units into the skin 3 (three) times daily with meals. Sliding scale insulin Less than 70 initiate hypoglycemia protocol 70-120  0 units 120-150 2 unit 151-200 3 units 201-250 3 units 251-300 5 units 301-350 8 units 351-400 11 units  Greater than 400 15 units , call MD, Disp: 10 mL, Rfl: 11 .  insulin detemir (LEVEMIR) 100 UNIT/ML injection, Inject 0.06 mLs (6 Units total) into the skin daily., Disp: 10 mL, Rfl: 11 .  levothyroxine (SYNTHROID, LEVOTHROID) 100 MCG tablet, Take 1 tablet by mouth daily., Disp: , Rfl:  .  magnesium oxide (MAG-OX) 400 (241.3 Mg) MG tablet, Take 1 tablet (400 mg total) by mouth daily., Disp: , Rfl:  .  meloxicam (MOBIC) 7.5 MG tablet, Take 1 tablet by mouth 2 (two) times daily., Disp: , Rfl:  .  Multiple Vitamin (MULTIVITAMIN WITH MINERALS) TABS tablet, Take 1 tablet by mouth daily., Disp: , Rfl:  .  nutrition supplement, JUVEN, (JUVEN) PACK, Take 1 packet by mouth 2 (two) times daily between meals., Disp: , Rfl: 0 .  nystatin (MYCOSTATIN/NYSTOP) powder, Apply topically 4 (four) times daily for 14 days., Disp: 60 g, Rfl: 5 .  pravastatin (PRAVACHOL) 10 MG tablet, Take 10 mg by mouth daily., Disp: , Rfl:  .  SOLIQUA 100-33 UNT-MCG/ML SOPN, Inject 35-65 Units as directed daily., Disp: , Rfl:   Review of Systems   Constitutional: Negative for chills, diaphoresis and fever.  HENT: Negative for congestion, hearing loss, sore throat and tinnitus.   Respiratory: Negative for cough, shortness of breath and wheezing.   Cardiovascular: Negative for chest pain, palpitations and leg swelling.  Gastrointestinal: Negative for abdominal pain, blood in stool, constipation, diarrhea, nausea and vomiting.  Genitourinary: Negative for dysuria, flank pain and hematuria.  Musculoskeletal: Positive for back pain, gait problem and myalgias.  Skin:  Negative for rash.  Neurological: Negative for dizziness, weakness and headaches.  Hematological: Does not bruise/bleed easily.  Psychiatric/Behavioral: Negative for agitation, behavioral problems, confusion, dysphoric mood and suicidal ideas. The patient is not nervous/anxious and is not hyperactive.        Objective:   Physical Exam Constitutional:      General: He is not in acute distress.    Appearance: He is not diaphoretic.  HENT:     Head: Normocephalic and atraumatic.     Right Ear: External ear normal.     Left Ear: External ear normal.     Nose: Nose normal.     Mouth/Throat:     Pharynx: No oropharyngeal exudate.  Eyes:     General: No scleral icterus.    Conjunctiva/sclera: Conjunctivae normal.     Pupils: Pupils are equal, round, and reactive to light.  Neck:     Musculoskeletal: Normal range of motion and neck supple.  Cardiovascular:     Rate and Rhythm: Normal rate and regular rhythm.     Heart sounds: Normal heart sounds.  Pulmonary:     Effort: Pulmonary effort is normal. No respiratory distress.     Breath sounds: No wheezing.  Abdominal:     General: There is no distension.     Palpations: Abdomen is soft.  Musculoskeletal:        General: No tenderness.     Right hip: He exhibits decreased range of motion.     Left hip: He exhibits decreased range of motion.  Lymphadenopathy:     Cervical: No cervical adenopathy.  Skin:    General:  Skin is warm and dry.     Coloration: Skin is not pale.     Findings: No erythema or rash.  Neurological:     Mental Status: He is alert and oriented to person, place, and time.     Coordination: Coordination normal.  Psychiatric:        Judgment: Judgment normal.    I was not able to fully bring his right leg up to the hip to perform internal/external rotations because he had severe pain in his right colic area with elevation of his leg.  Left side he could bring up and was able to check internal/external rotation which was limited but not especially painful.         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  Stomach antibiotics were withheld given the fact that he developed severe candidal infection with intertrigo that developed actual overt candidemia  He is experiencing worsening right-sided hip pain and I am concerned that his infection is recurred.  We will check inflammatory markers today if they are elevated get an MRI of the pelvis  Candidemia: He again has on exam erythema between legs for which she is taking topical statin powder.  I have ordered a large amount of this for him so he can take that.  I spent greater than 25 minutes with the patient including greater than 50% of time in face to face counsel of the patient during the nature of osteomyelitis and the risks of recurrence when bacteria are in bone, means which we would diagnose and treat this, while navigating potential risk of acquiring coronavirus 2019 and in coordination of his care.

## 2018-08-13 ENCOUNTER — Other Ambulatory Visit: Payer: Self-pay | Admitting: Infectious Disease

## 2018-08-13 ENCOUNTER — Telehealth: Payer: Self-pay

## 2018-08-13 DIAGNOSIS — M86651 Other chronic osteomyelitis, right thigh: Secondary | ICD-10-CM

## 2018-08-13 LAB — COMPLETE METABOLIC PANEL WITH GFR
AG Ratio: 0.9 (calc) — ABNORMAL LOW (ref 1.0–2.5)
ALT: 12 U/L (ref 9–46)
AST: 15 U/L (ref 10–35)
Albumin: 3.6 g/dL (ref 3.6–5.1)
Alkaline phosphatase (APISO): 137 U/L (ref 35–144)
BUN/Creatinine Ratio: 24 (calc) — ABNORMAL HIGH (ref 6–22)
BUN: 30 mg/dL — ABNORMAL HIGH (ref 7–25)
CO2: 25 mmol/L (ref 20–32)
Calcium: 9.3 mg/dL (ref 8.6–10.3)
Chloride: 102 mmol/L (ref 98–110)
Creat: 1.27 mg/dL — ABNORMAL HIGH (ref 0.70–1.11)
GFR, Est African American: 60 mL/min/{1.73_m2} (ref >=60)
GFR, Est Non African American: 52 mL/min/{1.73_m2} — ABNORMAL LOW (ref >=60)
Globulin: 4 g/dL (calc) — ABNORMAL HIGH (ref 1.9–3.7)
Glucose, Bld: 284 mg/dL — ABNORMAL HIGH (ref 65–99)
Potassium: 4.8 mmol/L (ref 3.5–5.3)
Sodium: 136 mmol/L (ref 135–146)
Total Bilirubin: 0.4 mg/dL (ref 0.2–1.2)
Total Protein: 7.6 g/dL (ref 6.1–8.1)

## 2018-08-13 LAB — CBC WITH DIFFERENTIAL/PLATELET
Absolute Monocytes: 644 cells/uL (ref 200–950)
Basophils Absolute: 44 cells/uL (ref 0–200)
Basophils Relative: 0.4 %
Eosinophils Absolute: 233 cells/uL (ref 15–500)
Eosinophils Relative: 2.1 %
HCT: 30.1 % — ABNORMAL LOW (ref 38.5–50.0)
Hemoglobin: 9.7 g/dL — ABNORMAL LOW (ref 13.2–17.1)
Lymphs Abs: 1421 cells/uL (ref 850–3900)
MCH: 27.1 pg (ref 27.0–33.0)
MCHC: 32.2 g/dL (ref 32.0–36.0)
MCV: 84.1 fL (ref 80.0–100.0)
MPV: 10.5 fL (ref 7.5–12.5)
Monocytes Relative: 5.8 %
Neutro Abs: 8758 cells/uL — ABNORMAL HIGH (ref 1500–7800)
Neutrophils Relative %: 78.9 %
Platelets: 431 10*3/uL — ABNORMAL HIGH (ref 140–400)
RBC: 3.58 10*6/uL — ABNORMAL LOW (ref 4.20–5.80)
RDW: 13.4 % (ref 11.0–15.0)
Total Lymphocyte: 12.8 %
WBC: 11.1 10*3/uL — ABNORMAL HIGH (ref 3.8–10.8)

## 2018-08-13 LAB — C-REACTIVE PROTEIN: CRP: 45.7 mg/L — ABNORMAL HIGH (ref <8.0)

## 2018-08-13 LAB — SEDIMENTATION RATE: Sed Rate: 62 mm/h — ABNORMAL HIGH (ref 0–20)

## 2018-08-13 NOTE — Telephone Encounter (Signed)
Patient informed of results and made aware that provider has ordered an MRI.  Eugenia Mcalpine, LPN

## 2018-08-13 NOTE — Telephone Encounter (Signed)
-----   Message from Truman Hayward, MD sent at 08/13/2018  9:27 AM EDT ----- Inflammatory markers are up and I have ordered an MRI of the pelvis

## 2018-09-05 ENCOUNTER — Other Ambulatory Visit: Payer: Self-pay

## 2018-09-05 ENCOUNTER — Ambulatory Visit
Admission: RE | Admit: 2018-09-05 | Discharge: 2018-09-05 | Disposition: A | Discharge status: Home / Self Care | Payer: Medicare Other | Source: Ambulatory Visit | Attending: Infectious Disease | Admitting: Infectious Disease

## 2018-09-05 DIAGNOSIS — M86651 Other chronic osteomyelitis, right thigh: Secondary | ICD-10-CM

## 2018-09-05 MED ORDER — GADOBENATE DIMEGLUMINE 529 MG/ML IV SOLN
14.0000 mL | Freq: Once | INTRAVENOUS | Status: AC | PRN
  Administered 2018-09-05: 12:00:00 14 mL via INTRAVENOUS

## 2018-09-11 ENCOUNTER — Inpatient Hospital Stay (HOSPITAL_COMMUNITY)
Admission: AD | Admit: 2018-09-11 | Discharge: 2018-09-14 | DRG: 372 | Disposition: A | Discharge status: Home Health | Payer: Medicare Other | Source: Ambulatory Visit | Attending: Internal Medicine | Admitting: Internal Medicine

## 2018-09-11 ENCOUNTER — Other Ambulatory Visit: Payer: Self-pay

## 2018-09-11 ENCOUNTER — Encounter: Payer: Self-pay | Admitting: Infectious Disease

## 2018-09-11 ENCOUNTER — Ambulatory Visit: Payer: Medicare Other | Admitting: Infectious Disease

## 2018-09-11 VITALS — BP 145/86 | HR 114 | Temp 97.7°F | Ht 68.0 in | Wt 157.0 lb

## 2018-09-11 DIAGNOSIS — M8668 Other chronic osteomyelitis, other site: Secondary | ICD-10-CM | POA: Diagnosis present

## 2018-09-11 DIAGNOSIS — E785 Hyperlipidemia, unspecified: Secondary | ICD-10-CM | POA: Diagnosis present

## 2018-09-11 DIAGNOSIS — Z8546 Personal history of malignant neoplasm of prostate: Secondary | ICD-10-CM

## 2018-09-11 DIAGNOSIS — M1612 Unilateral primary osteoarthritis, left hip: Secondary | ICD-10-CM | POA: Diagnosis present

## 2018-09-11 DIAGNOSIS — R339 Retention of urine, unspecified: Secondary | ICD-10-CM | POA: Diagnosis present

## 2018-09-11 DIAGNOSIS — K651 Peritoneal abscess: Principal | ICD-10-CM | POA: Diagnosis present

## 2018-09-11 DIAGNOSIS — B952 Enterococcus as the cause of diseases classified elsewhere: Secondary | ICD-10-CM | POA: Diagnosis not present

## 2018-09-11 DIAGNOSIS — Z794 Long term (current) use of insulin: Secondary | ICD-10-CM

## 2018-09-11 DIAGNOSIS — Z8619 Personal history of other infectious and parasitic diseases: Secondary | ICD-10-CM | POA: Diagnosis not present

## 2018-09-11 DIAGNOSIS — Z79899 Other long term (current) drug therapy: Secondary | ICD-10-CM

## 2018-09-11 DIAGNOSIS — I708 Atherosclerosis of other arteries: Secondary | ICD-10-CM | POA: Diagnosis present

## 2018-09-11 DIAGNOSIS — Z7982 Long term (current) use of aspirin: Secondary | ICD-10-CM | POA: Diagnosis not present

## 2018-09-11 DIAGNOSIS — Z881 Allergy status to other antibiotic agents status: Secondary | ICD-10-CM | POA: Diagnosis not present

## 2018-09-11 DIAGNOSIS — D649 Anemia, unspecified: Secondary | ICD-10-CM | POA: Diagnosis present

## 2018-09-11 DIAGNOSIS — K759 Inflammatory liver disease, unspecified: Secondary | ICD-10-CM | POA: Diagnosis present

## 2018-09-11 DIAGNOSIS — M86659 Other chronic osteomyelitis, unspecified thigh: Secondary | ICD-10-CM | POA: Diagnosis not present

## 2018-09-11 DIAGNOSIS — Z79890 Hormone replacement therapy (postmenopausal): Secondary | ICD-10-CM

## 2018-09-11 DIAGNOSIS — E1165 Type 2 diabetes mellitus with hyperglycemia: Secondary | ICD-10-CM

## 2018-09-11 DIAGNOSIS — L0291 Cutaneous abscess, unspecified: Secondary | ICD-10-CM | POA: Diagnosis not present

## 2018-09-11 DIAGNOSIS — Z978 Presence of other specified devices: Secondary | ICD-10-CM

## 2018-09-11 DIAGNOSIS — Z888 Allergy status to other drugs, medicaments and biological substances status: Secondary | ICD-10-CM | POA: Diagnosis not present

## 2018-09-11 DIAGNOSIS — M86652 Other chronic osteomyelitis, left thigh: Secondary | ICD-10-CM

## 2018-09-11 DIAGNOSIS — M8660 Other chronic osteomyelitis, unspecified site: Secondary | ICD-10-CM | POA: Diagnosis present

## 2018-09-11 DIAGNOSIS — L02416 Cutaneous abscess of left lower limb: Secondary | ICD-10-CM | POA: Diagnosis not present

## 2018-09-11 DIAGNOSIS — E119 Type 2 diabetes mellitus without complications: Secondary | ICD-10-CM

## 2018-09-11 DIAGNOSIS — Z20828 Contact with and (suspected) exposure to other viral communicable diseases: Secondary | ICD-10-CM | POA: Diagnosis present

## 2018-09-11 DIAGNOSIS — D509 Iron deficiency anemia, unspecified: Secondary | ICD-10-CM | POA: Diagnosis present

## 2018-09-11 DIAGNOSIS — E039 Hypothyroidism, unspecified: Secondary | ICD-10-CM | POA: Diagnosis present

## 2018-09-11 DIAGNOSIS — R5381 Other malaise: Secondary | ICD-10-CM | POA: Diagnosis present

## 2018-09-11 DIAGNOSIS — E1169 Type 2 diabetes mellitus with other specified complication: Secondary | ICD-10-CM | POA: Diagnosis present

## 2018-09-11 LAB — BASIC METABOLIC PANEL
Anion gap: 11 (ref 5–15)
BUN: 23 mg/dL (ref 8–23)
CO2: 25 mmol/L (ref 22–32)
Calcium: 9.2 mg/dL (ref 8.9–10.3)
Chloride: 103 mmol/L (ref 98–111)
Creatinine, Ser: 1.17 mg/dL (ref 0.61–1.24)
GFR calc Af Amer: >60 mL/min (ref >60)
GFR calc non Af Amer: 57 mL/min — ABNORMAL LOW (ref >60)
Glucose, Bld: 225 mg/dL — ABNORMAL HIGH (ref 70–99)
Potassium: 4.7 mmol/L (ref 3.5–5.1)
Sodium: 139 mmol/L (ref 135–145)

## 2018-09-11 LAB — CBC
HCT: 32.9 % — ABNORMAL LOW (ref 39.0–52.0)
Hemoglobin: 10.1 g/dL — ABNORMAL LOW (ref 13.0–17.0)
MCH: 26.4 pg (ref 26.0–34.0)
MCHC: 30.7 g/dL (ref 30.0–36.0)
MCV: 85.9 fL (ref 80.0–100.0)
Platelets: 467 10*3/uL — ABNORMAL HIGH (ref 150–400)
RBC: 3.83 MIL/uL — ABNORMAL LOW (ref 4.22–5.81)
RDW: 12.8 % (ref 11.5–15.5)
WBC: 9.1 10*3/uL (ref 4.0–10.5)
nRBC: 0 % (ref 0.0–0.2)

## 2018-09-11 LAB — SARS CORONAVIRUS 2 BY RT PCR (HOSPITAL ORDER, PERFORMED IN ~~LOC~~ HOSPITAL LAB): SARS Coronavirus 2: NEGATIVE

## 2018-09-11 LAB — GLUCOSE, POCT (MANUAL RESULT ENTRY): POC Glucose: 128 mg/dl — AB (ref 70–99)

## 2018-09-11 LAB — PROTIME-INR
INR: 1 (ref 0.8–1.2)
Prothrombin Time: 13 seconds (ref 11.4–15.2)

## 2018-09-11 LAB — C-REACTIVE PROTEIN: CRP: 10.4 mg/dL — ABNORMAL HIGH (ref <1.0)

## 2018-09-11 MED ORDER — ACETAMINOPHEN 650 MG RE SUPP: 650.0000 mg | Freq: Four times a day (QID) | RECTAL | Status: DC | PRN

## 2018-09-11 MED ORDER — SODIUM CHLORIDE 0.9 % IV SOLN
INTRAVENOUS | Status: DC
  Administered 2018-09-11 – 2018-09-14 (×4): via INTRAVENOUS

## 2018-09-11 MED ORDER — MAGNESIUM OXIDE 400 (241.3 MG) MG PO TABS
400.0000 mg | ORAL_TABLET | Freq: Every day | ORAL | Status: DC
  Administered 2018-09-12 – 2018-09-14 (×2): 400 mg via ORAL
  Filled 2018-09-11 (×3): qty 1

## 2018-09-11 MED ORDER — FERROUS SULFATE 325 (65 FE) MG PO TABS
325.0000 mg | ORAL_TABLET | Freq: Every day | ORAL | Status: DC
  Administered 2018-09-12 – 2018-09-14 (×2): 325 mg via ORAL
  Filled 2018-09-11 (×3): qty 1

## 2018-09-11 MED ORDER — LEVOTHYROXINE SODIUM 100 MCG PO TABS
100.0000 ug | ORAL_TABLET | Freq: Every day | ORAL | Status: DC
  Administered 2018-09-12 – 2018-09-14 (×3): 100 ug via ORAL
  Filled 2018-09-11 (×3): qty 1

## 2018-09-11 MED ORDER — JUVEN PO PACK
1.0000 | PACK | Freq: Two times a day (BID) | ORAL | Status: DC
  Administered 2018-09-11 – 2018-09-14 (×3): 1 via ORAL
  Filled 2018-09-11 (×7): qty 1

## 2018-09-11 MED ORDER — ADULT MULTIVITAMIN W/MINERALS CH
1.0000 | ORAL_TABLET | Freq: Every day | ORAL | Status: DC
  Administered 2018-09-12 – 2018-09-14 (×2): 1 via ORAL
  Filled 2018-09-11 (×3): qty 1

## 2018-09-11 MED ORDER — PRAVASTATIN SODIUM 10 MG PO TABS
10.0000 mg | ORAL_TABLET | Freq: Every day | ORAL | Status: DC
  Administered 2018-09-11 – 2018-09-13 (×3): 10 mg via ORAL
  Filled 2018-09-11 (×3): qty 1

## 2018-09-11 MED ORDER — ACETAMINOPHEN 325 MG PO TABS
650.0000 mg | ORAL_TABLET | Freq: Four times a day (QID) | ORAL | Status: DC | PRN
  Administered 2018-09-12 – 2018-09-13 (×2): 650 mg via ORAL
  Filled 2018-09-11 (×2): qty 2

## 2018-09-11 MED ORDER — INSULIN ASPART 100 UNIT/ML ~~LOC~~ SOLN
0.0000 [IU] | Freq: Three times a day (TID) | SUBCUTANEOUS | Status: DC
  Administered 2018-09-12: 08:00:00 2 [IU] via SUBCUTANEOUS
  Administered 2018-09-12 – 2018-09-13 (×2): 1 [IU] via SUBCUTANEOUS
  Administered 2018-09-13: 13:00:00 3 [IU] via SUBCUTANEOUS
  Administered 2018-09-13: 2 [IU] via SUBCUTANEOUS
  Administered 2018-09-14: 3 [IU] via SUBCUTANEOUS
  Administered 2018-09-14: 13:00:00 7 [IU] via SUBCUTANEOUS

## 2018-09-11 MED ORDER — INSULIN ASPART 100 UNIT/ML ~~LOC~~ SOLN
0.0000 [IU] | Freq: Every day | SUBCUTANEOUS | Status: DC
  Administered 2018-09-12: 2 [IU] via SUBCUTANEOUS
  Administered 2018-09-13: 3 [IU] via SUBCUTANEOUS

## 2018-09-11 NOTE — Progress Notes (Signed)
Subjective:    Patient ID: Anthony Dunlap, male    DOB: 1933-06-07, 85 y.o.   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 He is currently residing in a skilled nursing facility and on Zosyn rather than Unasyn. I do not know why this change was made as his microbes were covered by the Unasyn. In any case the abscesses have resolved radiographically and the drains been pulled by interventional radiology.  He did well since I last saw him in March 2020, but then was admitted to the hospital with severe candidal infection with intertrigo, and diarrhea with the catheter in place and candidemia.  He was treated with fluconazole but then developed hepatitis and was changed over to Eraxis and completed a two-week course.  He was not formally seen by an ophthalmologist in the hospital nor afterwards in the clinic.  He deniedhaving any visual changes other than occasionally having itching eyes.  He continues sheltering in place at his home with his wife, son( who also got on the phone call with dad) and only being visited by 2 other people who he employs to help take care of himself and his wife.  I had last physically seen him saw him he was doing better and was able to walk around without much hip pain  However when I conducted a telephone visit with him on June 15 of 2020 he was experiencing worsening right-sided hip pain with ambulation and with times different positions in his bed.  He tells me at the last visit he mentioned painthat he associates this with having undergone some physical therapy with some external and internal rotation of his hip joint. I saw him in person in late June and  his inflammatory markers were elevated I then ordered an MRI on July 16th showed:  IMPRESSION: Osteomyelitis about the symphysis pubis demonstrates some improvement, particularly in the superior pubic rami. There has been only minimal improvement in signal abnormality in the inferior pubic rami.  Tiny fluid collection in the symphysis pubis is new since the prior examination worrisome for abscess.  No change in severe right and moderately severe left hip osteoarthritis.  I arrange for him to come to clinic today so it could directly admit him to the hospital.  He has not had much change in his pain since I last saw him though more is on the right side which is interesting given the findings on imaging.  On exam he has more difficulty and pain with internal or external rotation of his right hip versus the left.  He is without fevers or systemic symptoms.  His vital signs are stable he is a bit tachycardic in the 1 4 teens.     Past Medical History:  Diagnosis Date  . 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 Diabetes ([place])   . Drug-induced hepatitis 06/05/2018  . Left eye pain   . Prostate cancer [facility])     Past Surgical History:  Procedure Laterality Date  . HERNIA REPAIR    . IR RADIOLOGIST EVAL & MGMT  03/06/2018  . PROSTATE SURGERY      Family History  Problem Relation Age of  Onset  . Pneumonia Father   . Cancer Sister       Social History   Socioeconomic History  . Marital status: Married    Spouse name: [name]  . Number of children: 3  . Years of education: 9th  . Highest education level: Not on file  Occupational History    Employer: RETIRED    Comment: Retired  Scientific laboratory technician  . Financial resource strain: Not on file  . Food insecurity    Worry: Not on file    Inability: Not on file  . Transportation needs    Medical: Not on file    Non-medical: Not on file  Tobacco Use  . Smoking status: Never Smoker  . Smokeless  tobacco: Never Used  Substance and Sexual Activity  . Alcohol use: No  . Drug use: No  . Sexual activity: Not on file  Lifestyle  . Physical activity    Days per week: Not on file    Minutes per session: Not on file  . Stress: Not on file  Relationships  . Social Herbalist on phone: Not on file    Gets together: Not on file    Attends religious service: Not on file    Active member of club or organization: Not on file    Attends meetings of clubs or organizations: Not on file    Relationship status: Not on file  Other Topics Concern  . Not on file  Social History Narrative   Patient lives at home with his wife. [name]) . Patient is retired.   Education 9th grade.   Right handed.   Caffeine None    Allergies  Allergen Reactions  . Fluconazole Other (See Comments)    Drug-induced hepatitis     Current Outpatient Medications:  .  acetaminophen (TYLENOL) 325 MG tablet, Take 2 tablets (650 mg total) by mouth every 6 (six) hours as needed for mild pain, fever or headache., Disp: , Rfl:  .  aspirin EC 81 MG tablet, Take 81 mg by mouth daily., Disp: , Rfl:  .  ferrous sulfate 325 (65 FE) MG tablet, Take 1 tablet (325 mg total) by mouth daily with breakfast., Disp: , Rfl: 3 .  glimepiride (AMARYL) 4 MG tablet, Take 1 tablet by mouth daily., Disp: , Rfl:  .  insulin aspart (NOVOLOG) 100 UNIT/ML injection, Inject 0-15 Units into the skin 3 (three) times daily with meals. Sliding scale insulin Less than 70 initiate hypoglycemia protocol 70-120  0 units 120-150 2 unit 151-200 3 units 201-250 3 units 251-300 5 units 301-350 8 units 351-400 11 units  Greater than 400 15 units , call MD, Disp: 10 mL, Rfl: 11 .  insulin detemir (LEVEMIR) 100 UNIT/ML injection, Inject 0.06 mLs (6 Units total) into the skin daily., Disp: 10 mL, Rfl: 11 .  levothyroxine (SYNTHROID, LEVOTHROID) 100 MCG tablet, Take 1 tablet by mouth daily., Disp: , Rfl:  .  magnesium oxide (MAG-OX) 400 (241.3 Mg)  MG tablet, Take 1 tablet (400 mg total) by mouth daily., Disp: , Rfl:  .  meloxicam (MOBIC) 7.5 MG tablet, Take 1 tablet by mouth 2 (two) times daily., Disp: , Rfl:  .  Multiple Vitamin (MULTIVITAMIN WITH MINERALS) TABS tablet, Take 1 tablet by mouth daily., Disp: , Rfl:  .  nutrition supplement, JUVEN, (JUVEN) PACK, Take 1 packet by mouth 2 (two) times daily between meals., Disp: , Rfl: 0 .  pravastatin (PRAVACHOL) 10 MG tablet, Take  10 mg by mouth daily., Disp: , Rfl:  .  SOLIQUA 100-33 UNT-MCG/ML SOPN, Inject 35-65 Units as directed daily., Disp: , Rfl:    Review of Systems  Constitutional: Negative for activity change, appetite change, chills, diaphoresis, fatigue, fever and unexpected weight change.  HENT: Negative for congestion, rhinorrhea, sinus pressure, sneezing, sore throat and trouble swallowing.   Eyes: Negative for photophobia and visual disturbance.  Respiratory: Negative for cough, chest tightness, shortness of breath, wheezing and stridor.   Cardiovascular: Negative for chest pain, palpitations and leg swelling.  Gastrointestinal: Negative for abdominal distention, abdominal pain, anal bleeding, blood in stool, constipation, diarrhea, nausea and vomiting.  Genitourinary: Negative for difficulty urinating, dysuria, flank pain and hematuria.  Musculoskeletal: Positive for arthralgias, gait problem and myalgias. Negative for back pain and joint swelling.  Skin: Negative for color change, pallor, rash and wound.  Neurological: Negative for dizziness, tremors, weakness and light-headedness.  Hematological: Negative for adenopathy. Does not bruise/bleed easily.  Psychiatric/Behavioral: Negative for agitation, behavioral problems, confusion, decreased concentration, dysphoric mood and sleep disturbance.       Objective:   Physical Exam Constitutional:      General: He is not in acute distress.    Appearance: Normal appearance. He is well-developed. He is not ill-appearing or  diaphoretic.  HENT:     Head: Normocephalic and atraumatic.     Right Ear: Hearing and external ear normal.     Left Ear: Hearing and external ear normal.     Nose: No nasal deformity or rhinorrhea.  Eyes:     General: No scleral icterus.    Conjunctiva/sclera: Conjunctivae normal.     Right eye: Right conjunctiva is not injected.     Left eye: Left conjunctiva is not injected.  Neck:     Musculoskeletal: Normal range of motion and neck supple.     Vascular: No JVD.  Cardiovascular:     Rate and Rhythm: Regular rhythm. Tachycardia present.     Heart sounds: S1 normal and S2 normal.  Abdominal:     General: There is no distension.     Palpations: Abdomen is soft.  Musculoskeletal:     Right shoulder: Normal.     Left shoulder: Normal.     Right hip: He exhibits decreased range of motion.     Left hip: He exhibits decreased range of motion.     Right knee: Normal.     Left knee: Normal.  Lymphadenopathy:     Head:     Right side of head: No submandibular, preauricular or posterior auricular adenopathy.     Left side of head: No submandibular, preauricular or posterior auricular adenopathy.     Cervical: No cervical adenopathy.     Right cervical: No superficial or deep cervical adenopathy.    Left cervical: No superficial or deep cervical adenopathy.  Skin:    General: Skin is warm and dry.     Coloration: Skin is not pale.     Findings: No abrasion, bruising, ecchymosis, erythema, lesion or rash.     Nails: There is no clubbing.   Neurological:     General: No focal deficit present.     Mental Status: He is alert and oriented to person, place, and time.     Sensory: No sensory deficit.     Coordination: Coordination normal.     Gait: Gait normal.  Psychiatric:        Attention and Perception: He is attentive.  Mood and Affect: Mood normal.        Speech: Speech normal.        Behavior: Behavior normal. Behavior is cooperative.        Thought Content: Thought  content normal.        Judgment: Judgment normal.           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  He has had elevated inflammatory markers and MRI shows a new pelvic abscess near the symphysis pubis.  I am arranging him to be admitted to the hospitalist service.  I would like him to receive 0 equals no antibiotics at all until he has been assessed by radiology.  I am hoping that radiology cannot perform an IR guided aspirate of this abscess off antibiotics so that we can obtain cultures to guide therapy.  If this is not possibl then I would consider restarting him on IV Unasyn and planning on a course of 8 weeks of therapy.  I will let my partner Dr. Linus Salmons know about the patient coming into the hospital and he can see the patient tomorrow to follow-up on culture results   I spent greater than 25 minutes with the patient including greater than 50% of time in face to face counsel of the patient and his son regarding the nature of pelvic osteomyelitis, how we diagnosed infections by getting cultures off antibiotics need for interventional radiology to do aspirated abscess need for [place] to re-treat him with intravenous antibiotics and in coordination of his care with the hospitalist team..

## 2018-09-11 NOTE — H&P (Signed)
TRH H&P   Patient Demographics:    Anthony Dunlap, is a 85 y.o. male  MRN: [id number]   DOB - [birth year]  Admit Date - 09/11/2018  Outpatient Primary MD for the patient is [name], MD  Referring MD/NP/PA: Dr Bartholomew Boards  Patient coming from: sent from ID clinic  No chief complaint on file.     HPI:    Anthony Dunlap  is a 85 y.o. 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     Review of systems:    In addition to the HPI above, No Fever-chills, No Headache, No changes with Vision or hearing, No problems swallowing food or Liquids, No Chest pain, Cough or Shortness of Breath, No Abdominal pain, No Nausea or Vommitting, Bowel movements are regular, No Blood in stool or Urine, No dysuria, No new skin rashes or bruises, Patient reports worsening pain in pelvic area, mainly right hip No new weakness, tingling, numbness in any extremity, No recent weight gain or loss, No polyuria, polydypsia or polyphagia, No significant Mental Stressors.  A full 10 point Review of Systems was done, except as stated above, all  other Review of Systems were negative.   With Past History of the following :    Past Medical History:  Diagnosis Date  . 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 Diabetes ([place])   . Drug-induced hepatitis 06/05/2018  . Left eye pain   . Prostate cancer [facility])       Past Surgical History:  Procedure Laterality Date  . HERNIA REPAIR    . IR RADIOLOGIST EVAL & MGMT  03/06/2018  . PROSTATE SURGERY        Social History:     Social History   Tobacco Use  . Smoking status: Never Smoker  . Smokeless tobacco: Never Used  Substance Use Topics  . Alcohol use: No     Lives - at home  Mobility - with cane    Family History :     Family History  Problem Relation Age of Onset  . Pneumonia Father   . Cancer Sister      Home Medications:   Prior to Admission medications  Medication Sig Start Date End Date Taking? Authorizing Provider  acetaminophen (TYLENOL) 325 MG tablet Take 2 tablets (650 mg total) by mouth every 6 (six) hours as needed for mild pain, fever or headache. 02/18/18   Oswald Hillock, MD  aspirin EC 81 MG tablet Take 81 mg by mouth daily.    [provider]  ferrous sulfate 325 (65 FE) MG tablet Take 1 tablet (325 mg total) by mouth daily with breakfast. 02/19/18   Oswald Hillock, MD  glimepiride (AMARYL) 4 MG tablet Take 1 tablet by mouth daily. 04/18/18   [provider]  insulin aspart (NOVOLOG) 100 UNIT/ML injection Inject 0-15 Units into the skin 3 (three) times daily with meals. Sliding scale insulin Less than 70 initiate hypoglycemia protocol 70-120  0 units 120-150 2 unit 151-200 3 units 201-250 3 units 251-300 5 units 301-350 8 units 351-400 11 units  Greater than 400 15 units , call MD 02/18/18   Oswald Hillock, MD  insulin detemir (LEVEMIR) 100 UNIT/ML injection Inject 0.06 mLs (6 Units total) into the skin daily. 05/15/18   Manuella Ghazi, Pratik D, DO  levothyroxine (SYNTHROID, LEVOTHROID) 100 MCG tablet  Take 1 tablet by mouth daily. 04/18/18   [provider]  magnesium oxide (MAG-OX) 400 (241.3 Mg) MG tablet Take 1 tablet (400 mg total) by mouth daily. 02/19/18   Oswald Hillock, MD  meloxicam (MOBIC) 7.5 MG tablet Take 1 tablet by mouth 2 (two) times daily. 04/18/18   [provider]  Multiple Vitamin (MULTIVITAMIN WITH MINERALS) TABS tablet Take 1 tablet by mouth daily. 02/19/18   Oswald Hillock, MD  nutrition supplement, JUVEN, (JUVEN) PACK Take 1 packet by mouth 2 (two) times daily between meals. 02/19/18   Oswald Hillock, MD  pravastatin (PRAVACHOL) 10 MG tablet Take 10 mg by mouth daily.    [provider]  [place] 100-33 UNT-MCG/ML SOPN Inject 35-65 Units as directed daily. 04/28/18   [provider]     Allergies:     Allergies  Allergen Reactions  . Fluconazole Other (See Comments)    Drug-induced hepatitis     Physical Exam:   Vitals  Blood pressure 136/79, pulse (!) 125, temperature 98.5 F (36.9 C), temperature source Oral, resp. rate 19, SpO2 100 %.   1. General elderly  male, comfortable, laying in bed in no apparent distress  2. Normal affect and insight, Not Suicidal or Homicidal, Awake Alert, Oriented X 3.  3. No F.N deficits, ALL C.Nerves Intact, Strength 5/5 all 4 extremities, Sensation intact all 4 extremities, Plantars down going.  4. Ears and Eyes appear Normal, Conjunctivae clear, PERRLA. Moist Oral Mucosa.  5. Supple Neck, No JVD, No cervical lymphadenopathy appriciated, No Carotid Bruits.  6. Symmetrical Chest wall movement, Good air movement bilaterally, CTAB.  7. RRR, No Gallops, Rubs or Murmurs, No Parasternal Heave.  8. Positive Bowel Sounds, Abdomen Soft, No tenderness, No organomegaly appriciated,No rebound -guarding or rigidity.  9.  No Cyanosis, Normal Skin Turgor, No Skin Rash or Bruise.  10. Good muscle tone,  joints appear normal , no effusions, Normal ROM.  11. No Palpable Lymph Nodes in Neck or Axillae     Data Review:   We will obtain blood cultures, CBC, BMP, INR, CRP  CBC No results for input(s): WBC, HGB, HCT, PLT, MCV, MCH, MCHC, RDW, LYMPHSABS, MONOABS, EOSABS, BASOSABS, BANDABS in the last 168 hours.  Invalid input(s): NEUTRABS, BANDSABD ------------------------------------------------------------------------------------------------------------------  Chemistries  No results for input(s):  NA, K, CL, CO2, GLUCOSE, BUN, CREATININE, CALCIUM, MG, AST, ALT, ALKPHOS, BILITOT in the last 168 hours.  Invalid input(s): GFRCGP ------------------------------------------------------------------------------------------------------------------ CrCl cannot be calculated (Patient's most recent lab result is older than the maximum 21 days allowed.). ------------------------------------------------------------------------------------------------------------------ No results for input(s): TSH, T4TOTAL, T3FREE, THYROIDAB in the last 72 hours.  Invalid input(s): FREET3  Coagulation profile No results for input(s): INR, PROTIME in the last 168 hours. ------------------------------------------------------------------------------------------------------------------- No results for input(s): DDIMER in the last 72 hours. -------------------------------------------------------------------------------------------------------------------  Cardiac Enzymes No results for input(s): CKMB, TROPONINI, MYOGLOBIN in the last 168 hours.  Invalid input(s): CK ------------------------------------------------------------------------------------------------------------------ No results found for: BNP   ---------------------------------------------------------------------------------------------------------------  Urinalysis    Component Value Date/Time   COLORURINE YELLOW 06/24/2018 1930   APPEARANCEUR CLOUDY (A) 06/24/2018 1930   LABSPEC 1.008 06/24/2018 1930   PHURINE 7.0 06/24/2018 1930   GLUCOSEU NEGATIVE  06/24/2018 1930   HGBUR MODERATE (A) 06/24/2018 1930   BILIRUBINUR NEGATIVE 06/24/2018 1930   KETONESUR NEGATIVE 06/24/2018 1930   PROTEINUR NEGATIVE 06/24/2018 1930   UROBILINOGEN 0.2 09/03/2013 0132   NITRITE NEGATIVE 06/24/2018 1930   LEUKOCYTESUR LARGE (A) 06/24/2018 1930    ----------------------------------------------------------------------------------------------------------------   Imaging Results:    No results found.  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 -Plan is to obtain aspirate of abscess to guide therapy, IR consulted, they will need CT pelvis with contrast, discussed with interventional radiology, will get CT pelvis with contrast done today once his labs are back and he has normal creatinine, hopefully procedure can be done tomorrow, he will be kept n.p.o. after midnight . -Hold on IV antibiotic currently until able to obtain sample. -Dr Linus Salmons will follow during hospital stay per ID note -Consult PT given patient is less ambulatory, with worsening pain   Diabetes mellitus type 2 -Continue with long-acting insulin, will add insulin sliding scale during hospital stay  Chronic anemia -Obtain CBC on admission, continue with iron supplement  Hypothyroidism -Continue with home meds  Hyperlipidemia -Continue with home meds  Please follow on screening COVID-19 obtained on admission  DVT Prophylaxis   SCDs , will start DVT prophylaxis after procedure  AM Labs Ordered, also please review Full Orders  Family Communication: Admission, patients condition and plan of care including tests being ordered have been discussed with the patient  who indicate understanding and agree with the plan and Code Status.  Code Status Full  Likely DC to  Home  Condition GUARDED    Consults called: IR, ID to follow per their office note  Admission status: Inpatient , as he will need to be started on IV antibiotics after abscess specimen obtained by IR  Time spent in minutes : 55 minutes   Phillips Climes M.D on 09/11/2018 at 1:42 PM  Between 7am to 7pm - Pager - [phone]. After 7pm go to [email or web address] - password [facility]  [facility] - Office  [phone]

## 2018-09-11 NOTE — Progress Notes (Signed)
Patient ID: Anthony Dunlap, male   DOB: 12/11/1933, 85 y.o.   MRN: [id number]   Pubic symphysis abscess aspiration has been requested.  Dr Pascal Lux reviewed imaging Rec: Pelvic CT with IV cx only Last imaging 7/17 - MRI  Discussed with Dr Waldron Labs

## 2018-09-11 NOTE — Progress Notes (Signed)
Patient admitted to unit from home via wheelchair, alert and oriented. Patient settled in bed and oriented to unit and hospital routine. Patient arrived with foley catheter in place, has healing scabs to bilateral heals and moisture breakdown to gluteal cleft, otherwise skin is intact. Pt resting comfortably in bed with call bell in reach and side rails up. Instructed patient to utilize call bell for assistance. Will continue to monitor closely for remainder of shift.

## 2018-09-12 ENCOUNTER — Inpatient Hospital Stay (HOSPITAL_COMMUNITY): Payer: Medicare Other

## 2018-09-12 DIAGNOSIS — Z881 Allergy status to other antibiotic agents status: Secondary | ICD-10-CM

## 2018-09-12 DIAGNOSIS — M86659 Other chronic osteomyelitis, unspecified thigh: Secondary | ICD-10-CM

## 2018-09-12 DIAGNOSIS — Z8619 Personal history of other infectious and parasitic diseases: Secondary | ICD-10-CM

## 2018-09-12 LAB — GLUCOSE, CAPILLARY
Glucose-Capillary: 108 mg/dL — ABNORMAL HIGH (ref 70–99)
Glucose-Capillary: 157 mg/dL — ABNORMAL HIGH (ref 70–99)
Glucose-Capillary: 147 mg/dL — ABNORMAL HIGH (ref 70–99)
Glucose-Capillary: 129 mg/dL — ABNORMAL HIGH (ref 70–99)

## 2018-09-12 LAB — BASIC METABOLIC PANEL
Anion gap: 9 (ref 5–15)
BUN: 24 mg/dL — ABNORMAL HIGH (ref 8–23)
CO2: 25 mmol/L (ref 22–32)
Calcium: 9 mg/dL (ref 8.9–10.3)
Chloride: 105 mmol/L (ref 98–111)
Creatinine, Ser: 1.16 mg/dL (ref 0.61–1.24)
GFR calc Af Amer: >60 mL/min (ref >60)
GFR calc non Af Amer: 58 mL/min — ABNORMAL LOW (ref >60)
Glucose, Bld: 182 mg/dL — ABNORMAL HIGH (ref 70–99)
Potassium: 4.3 mmol/L (ref 3.5–5.1)
Sodium: 139 mmol/L (ref 135–145)

## 2018-09-12 LAB — CBC
HCT: 31.2 % — ABNORMAL LOW (ref 39.0–52.0)
Hemoglobin: 9.8 g/dL — ABNORMAL LOW (ref 13.0–17.0)
MCH: 26.7 pg (ref 26.0–34.0)
MCHC: 31.4 g/dL (ref 30.0–36.0)
MCV: 85 fL (ref 80.0–100.0)
Platelets: 405 10*3/uL — ABNORMAL HIGH (ref 150–400)
RBC: 3.67 MIL/uL — ABNORMAL LOW (ref 4.22–5.81)
RDW: 12.8 % (ref 11.5–15.5)
WBC: 8.2 10*3/uL (ref 4.0–10.5)
nRBC: 0.2 % (ref 0.0–0.2)

## 2018-09-12 LAB — PROTIME-INR
INR: 1 (ref 0.8–1.2)
Prothrombin Time: 13.2 seconds (ref 11.4–15.2)

## 2018-09-12 LAB — HEMOGLOBIN A1C
Hgb A1c MFr Bld: 7.7 % — ABNORMAL HIGH (ref 4.8–5.6)
Mean Plasma Glucose: 174.29 mg/dL

## 2018-09-12 MED ORDER — GUAIFENESIN-DM 100-10 MG/5ML PO SYRP
5.0000 mL | ORAL_SOLUTION | ORAL | Status: DC | PRN
  Administered 2018-09-12: 5 mL via ORAL
  Filled 2018-09-12: qty 5

## 2018-09-12 MED ORDER — IOPAMIDOL (ISOVUE-300) INJECTION 61%
100.0000 mL | Freq: Once | INTRAVENOUS | Status: AC | PRN
  Administered 2018-09-12: 09:00:00 100 mL via INTRAVENOUS

## 2018-09-12 NOTE — Progress Notes (Deleted)
Patient admitted to unit from rehab, alert and oriented. Patient settled in bed and oriented to unit and hospital routine. Pt resting comfortably in chair with call bell in reach. Instructed patient to utilize call bell for assistance. Will continue to monitor closely for remainder of shift. 

## 2018-09-12 NOTE — Progress Notes (Signed)
Unable to accomodate on IR schedule this afternoon.  Discussed with RN.   NPO p MN.  Continue to hold blood thinners.   Brynda Greathouse, MS RD PA-C 4:49 PM

## 2018-09-12 NOTE — Consult Note (Signed)
[facility]       Reason for Consult: osteomyelitis    Referring Physician: Dr. 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 ferrous sulfate  325 mg Oral Q breakfast  . insulin aspart  0-5 Units Subcutaneous QHS  . insulin aspart  0-9 Units Subcutaneous TID WC  . levothyroxine  100 mcg Oral Q0600  . magnesium oxide  400 mg Oral Daily  . multivitamin with minerals  1 tablet Oral Daily  . nutrition supplement (JUVEN)  1 packet Oral BID BM  . pravastatin  10 mg Oral q1800    Recommendations: Hold antibiotics pending IR aspiration IR aspiration/bone biopsy - would check for bacterial, fungal and AFB cultures.   Assessment: He has a prolonged history of osteomyelitis, treated with amp/subactam and piptazo for 8 weeks through February 4 and now with more pain and MRI with osteomyelitis, now CT with similar findings.  He would benefit with aspiration or bone biopsy off of antibiotics and will need prolonged IV therapy. Will monitor blood cultures before placing a picc line  Antibiotics: none  HPI: Anthony Dunlap is a 85 y.o. male with history of osteomyelitis, polymicrobial, treated with Unasyn previous, history of Candidemia and now with more pelvic pain and MRI findings of very minimal improvement in signal abnormality of the inferior pubic rami and concern for ongoing osteomyelitis vs residual findings.  There was also a fluid collection of the symphysis pubis with concern for abscess and now CT with some widening of the pubic symphysis noted at the fluid collection area, concerning for septic arthritis.  He has had no associated fever or chills.     Review of Systems:  Constitutional: negative for fevers and chills Gastrointestinal: negative  for nausea and diarrhea Integument/breast: negative for rash All other systems reviewed and are negative    Past Medical History:  Diagnosis Date  . 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 Diabetes ([place])   . Drug-induced hepatitis 06/05/2018  . Left eye pain   . Prostate cancer [facility])     Social History   Tobacco Use  . Smoking status: Never Smoker  . Smokeless tobacco: Never Used  Substance Use Topics  . Alcohol use: No  . Drug use: No    Family History  Problem Relation Age of Onset  . Pneumonia Father   . Cancer Sister     Allergies  Allergen Reactions  . Fluconazole Other (See Comments)    Drug-induced hepatitis    Physical Exam: Constitutional: in no apparent distress  Vitals:   09/11/18 2110 09/12/18 0425  BP: 131/74 (!) 144/85  Pulse: 98 97  Resp: 17 18  Temp: 98.4 F (36.9 C) 98.3 F (36.8 C)  SpO2: 98% 96%   EYES: anicteric ENMT: no thrush Cardiovascular: Cor RRR Respiratory: CTA B; normal respiratory effort GI: Bowel sounds are normal, liver is not enlarged, spleen is not enlarged Musculoskeletal: no pedal edema noted Skin: negatives: no rash Neuro: non focal  Lab Results  Component Value Date   WBC 8.2 09/12/2018   HGB 9.8 (L) 09/12/2018   HCT 31.2 (L) 09/12/2018   MCV 85.0 09/12/2018   PLT 405 (H) 09/12/2018    Lab Results  Component Value Date   CREATININE 1.16 09/12/2018   BUN 24 (H) 09/12/2018   NA 139 09/12/2018   K 4.3 09/12/2018   CL 105 09/12/2018   CO2 25 09/12/2018    Lab Results  Component Value Date   ALT 12 08/12/2018   AST 15 08/12/2018   ALKPHOS 578 (H) 05/14/2018     Microbiology: Recent Results (from the past 240 hour(s))  SARS Coronavirus 2 (CEPHEID - Performed in [place] hospital lab), Hosp Order     Status: None   Collection Time: 09/11/18  2:10 PM   Specimen: Nasopharyngeal Swab  Result Value Ref Range Status   SARS Coronavirus 2 NEGATIVE NEGATIVE Final     Comment: (NOTE) If result is NEGATIVE SARS-CoV-2 target nucleic acids are NOT DETECTED. The SARS-CoV-2 RNA is generally detectable in upper and lower  respiratory specimens during the acute phase of infection. The lowest  concentration of SARS-CoV-2 viral copies this assay can detect is 250  copies / mL. A negative result does not preclude SARS-CoV-2 infection  and should not be used as the sole basis for treatment or other  patient management decisions.  A negative result may occur with  improper specimen collection / handling, submission of specimen other  than nasopharyngeal swab, presence of viral mutation(s) within the  areas targeted by this assay, and inadequate number of viral copies  (<250 copies / mL). A negative result must be combined with clinical  observations, patient history, and epidemiological information. If result is POSITIVE SARS-CoV-2 target nucleic acids are DETECTED. The SARS-CoV-2 RNA is generally detectable in upper and lower  respiratory specimens dur ing the acute phase of infection.  Positive  results are indicative of active infection with SARS-CoV-2.  Clinical  correlation with patient history and other diagnostic information is  necessary to determine patient infection status.  Positive results do  not rule out bacterial infection or co-infection with other viruses. If result is PRESUMPTIVE POSTIVE SARS-CoV-2 nucleic acids MAY BE PRESENT.   A presumptive positive result was obtained on the submitted specimen  and confirmed on repeat testing.  While 2019 novel coronavirus  (SARS-CoV-2) nucleic acids may be present in the submitted sample  additional confirmatory testing may be necessary for epidemiological  and / or clinical management purposes  to differentiate between  SARS-CoV-2 and other Sarbecovirus currently known to infect humans.  If clinically indicated additional testing with an alternate test  methodology [phone]) is advised. The SARS-CoV-2  RNA is generally  detectable in upper and lower respiratory sp ecimens during the acute  phase of infection. The expected result is Negative. Fact Sheet for Patients:  StrictlyIdeas.no Fact Sheet for Healthcare Providers: BankingDealers.co.za This test is not yet approved or cleared by the [place] FDA and has been authorized for detection and/or diagnosis of SARS-CoV-2 by FDA under an Emergency Use Authorization (EUA).  This EUA will remain in effect (meaning this test can be used) for the duration of the COVID-19 declaration under Section 564(b)(1) of the Act, 21 U.S.C. section 360bbb-3(b)(1), unless the authorization is terminated or revoked sooner. Performed at [facility], [place] [address] [place], [place] [address]   Culture, blood (routine x 2)     Status: None (Preliminary result)   Collection Time: 09/11/18  2:31 PM   Specimen: BLOOD RIGHT FOREARM  Result Value Ref Range Status   Specimen Description BLOOD RIGHT FOREARM  Final   Special Requests   Final    BOTTLES DRAWN AEROBIC  AND ANAEROBIC Blood Culture results may not be optimal due to an excessive volume of blood received in culture bottles Performed at [place] [facility] [address], [place], [place] [address]    Culture PENDING  Incomplete   Report Status PENDING  Incomplete    [name], [facility] [email or web address] 09/12/2018, 10:04 AM

## 2018-09-12 NOTE — Progress Notes (Signed)
PROGRESS NOTE    Anthony Dunlap  YIR:[id number] DOB: 01-25-1934 DOA: 09/11/2018 PCP: [name], MD   Brief Narrative:  Per HPI: Anthony Dunlap  is a 85 y.o. 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  Patient was admitted with pubic symphysis abscess in the setting of known history of pelvic osteomyelitis.  He was a direct admission from ID clinic.  Plans are for aspirate of abscess per IR today.  ID to follow.  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 -Plan is to obtain aspirate of abscess to guide therapy, IR consulted, they will need CT pelvis with contrast;  has been NPO after midnight. Cr stable. -Hold on IV antibiotic currently until able to obtain sample. -Dr Linus Salmons will follow during hospital stay per ID note -Consult PT given patient is less ambulatory, with worsening pain   Diabetes mellitus type 2-stable -Continue with long-acting insulin, will add insulin sliding scale during hospital stay  Chronic anemia-stable -Obtain CBC in am  Hypothyroidism -Continue with home meds  Hyperlipidemia -Continue with home meds   DVT prophylaxis: SCDs Code Status: Full Family Communication: None at bedside Disposition Plan: Abscess aspiration per IR.  Further management per ID.   Consultants:   ID  Procedures:   None  Antimicrobials:   None   Subjective: Patient seen and evaluated today with no new acute complaints or concerns. No acute concerns or events noted overnight.  He continues to complain of some right hip and groin pain that is minimal.  Objective: Vitals:   09/11/18 1252 09/11/18 1949 09/11/18 2110 09/12/18 0425  BP: 136/79  131/74 (!) 144/85  Pulse: (!) 125 98 98 97  Resp: 19  17 18   Temp: 98.5 F (36.9 C)  98.4 F (36.9 C) 98.3 F (36.8 C)  TempSrc: Oral  Oral Oral  SpO2: 100%  98% 96%    Intake/Output Summary (Last 24 hours) at 09/12/2018 0800 Last data filed at 09/12/2018 5009 Gross per 24 hour  Intake 969.96 ml  Output 2065 ml  Net -1095.04 ml   There were no vitals filed for this visit.  Examination:  General exam: Appears calm and comfortable  Respiratory system: Clear to auscultation. Respiratory effort normal. Cardiovascular system: S1 & S2 heard,  RRR. No JVD, murmurs, rubs, gallops or clicks. No pedal edema. Gastrointestinal system: Abdomen is nondistended, soft and nontender. No organomegaly or masses felt. Normal bowel sounds heard. Central nervous system: Alert and oriented. No focal neurological deficits. Extremities: Symmetric 5 x 5 power. Skin: No rashes, lesions or  ulcers Psychiatry: Judgement and insight appear normal. Mood & affect appropriate.     Data Reviewed: I have personally reviewed following labs and imaging studies  CBC: Recent Labs  Lab 09/11/18 1424 09/12/18 0419  WBC 9.1 8.2  HGB 10.1* 9.8*  HCT 32.9* 31.2*  MCV 85.9 85.0  PLT 467* 277*   Basic Metabolic Panel: Recent Labs  Lab 09/11/18 1424 09/12/18 0419  NA 139 139  K 4.7 4.3  CL 103 105  CO2 25 25  GLUCOSE 225* 182*  BUN 23 24*  CREATININE 1.17 1.16  CALCIUM 9.2 9.0   GFR: Estimated Creatinine Clearance: 46.7 mL/min (by C-G formula based on SCr of 1.16 mg/dL). Liver Function Tests: No results for input(s): AST, ALT, ALKPHOS, BILITOT, PROT, ALBUMIN in the last 168 hours. No results for input(s): LIPASE, AMYLASE in the last 168 hours. No results for input(s): AMMONIA in the last 168 hours. Coagulation Profile: Recent Labs  Lab 09/11/18 1424 09/12/18 0419  INR 1.0 1.0   Cardiac Enzymes: No results for input(s): CKTOTAL, CKMB, CKMBINDEX, TROPONINI in the last 168 hours. BNP (last 3 results) No results for input(s): PROBNP in the last 8760 hours. HbA1C: Recent Labs    09/12/18 0419  HGBA1C 7.7*   CBG: No results for input(s): GLUCAP in the last 168 hours. Lipid Profile: No results for input(s): CHOL, HDL, LDLCALC, TRIG, CHOLHDL, LDLDIRECT in the last 72 hours. Thyroid Function Tests: No results for input(s): TSH, T4TOTAL, FREET4, T3FREE, THYROIDAB in the last 72 hours. Anemia Panel: No results for input(s): VITAMINB12, FOLATE, FERRITIN, TIBC, IRON, RETICCTPCT in the last 72 hours. Sepsis Labs: No results for input(s): PROCALCITON, LATICACIDVEN in the last 168 hours.  Recent Results (from the past 240 hour(s))  SARS Coronavirus 2 (CEPHEID - Performed in [place] hospital lab), Hosp Order     Status: None   Collection Time: 09/11/18  2:10 PM   Specimen: Nasopharyngeal Swab  Result Value Ref Range Status   SARS Coronavirus 2 NEGATIVE NEGATIVE  Final    Comment: (NOTE) If result is NEGATIVE SARS-CoV-2 target nucleic acids are NOT DETECTED. The SARS-CoV-2 RNA is generally detectable in upper and lower  respiratory specimens during the acute phase of infection. The lowest  concentration of SARS-CoV-2 viral copies this assay can detect is 250  copies / mL. A negative result does not preclude SARS-CoV-2 infection  and should not be used as the sole basis for treatment or other  patient management decisions.  A negative result may occur with  improper specimen collection / handling, submission of specimen other  than nasopharyngeal swab, presence of viral mutation(s) within the  areas targeted by this assay, and inadequate number of viral copies  (<250 copies / mL). A negative result must be combined with clinical  observations, patient history, and epidemiological information. If result is POSITIVE SARS-CoV-2 target nucleic acids are DETECTED. The SARS-CoV-2 RNA is generally detectable in upper and lower  respiratory specimens dur ing the acute phase of infection.  Positive  results are indicative of active infection with SARS-CoV-2.  Clinical  correlation with patient history and other diagnostic information is  necessary to determine patient infection status.  Positive results do  not  rule out bacterial infection or co-infection with other viruses. If result is PRESUMPTIVE POSTIVE SARS-CoV-2 nucleic acids MAY BE PRESENT.   A presumptive positive result was obtained on the submitted specimen  and confirmed on repeat testing.  While 2019 novel coronavirus  (SARS-CoV-2) nucleic acids may be present in the submitted sample  additional confirmatory testing may be necessary for epidemiological  and / or clinical management purposes  to differentiate between  SARS-CoV-2 and other Sarbecovirus currently known to infect humans.  If clinically indicated additional testing with an alternate test  methodology [phone]) is advised. The  SARS-CoV-2 RNA is generally  detectable in upper and lower respiratory sp ecimens during the acute  phase of infection. The expected result is Negative. Fact Sheet for Patients:  StrictlyIdeas.no Fact Sheet for Healthcare Providers: BankingDealers.co.za This test is not yet approved or cleared by the [place] FDA and has been authorized for detection and/or diagnosis of SARS-CoV-2 by FDA under an Emergency Use Authorization (EUA).  This EUA will remain in effect (meaning this test can be used) for the duration of the COVID-19 declaration under Section 564(b)(1) of the Act, 21 U.S.C. section 360bbb-3(b)(1), unless the authorization is terminated or revoked sooner. Performed at [facility], [place] [address], [place], [place] [address]   Culture, blood (routine x 2)     Status: None (Preliminary result)   Collection Time: 09/11/18  2:31 PM   Specimen: BLOOD RIGHT FOREARM  Result Value Ref Range Status   Specimen Description BLOOD RIGHT FOREARM  Final   Special Requests   Final    BOTTLES DRAWN AEROBIC AND ANAEROBIC Blood Culture results may not be optimal due to an excessive volume of blood received in culture bottles Performed at [facility], [place] [address] [place], [place] [address]    Culture PENDING  Incomplete   Report Status PENDING  Incomplete         Radiology Studies: No results found.      Scheduled Meds: . ferrous sulfate  325 mg Oral Q breakfast  . insulin aspart  0-5 Units Subcutaneous QHS  . insulin aspart  0-9 Units Subcutaneous TID WC  . levothyroxine  100 mcg Oral Q0600  . magnesium oxide  400 mg Oral Daily  . multivitamin with minerals  1 tablet Oral Daily  . nutrition supplement (JUVEN)  1 packet Oral BID BM  . pravastatin  10 mg Oral q1800   Continuous Infusions: . sodium chloride 50 mL/hr at 09/12/18 0646     LOS: 1 day    Time spent: 30 minutes    Austyn Seier Darleen Crocker, DO [facility] Pager [phone]  If 7PM-7AM, please contact night-coverage [email or web address] Password [email or web address] 09/12/2018, 8:00 AM

## 2018-09-12 NOTE — Progress Notes (Signed)
Spoke with daughter [name] and updated her on patients day and plan for surgery tomorrow a.m.

## 2018-09-12 NOTE — Evaluation (Signed)
Physical Therapy Evaluation Patient Details Name: Anthony Dunlap MRN: [id number] DOB: May 02, 1933 Today's Date: 09/12/2018   History of Present Illness  Pt is an 85 y/o male presenting from ID clinic secondary to worsening R hip pain. Pt found to have pubic symphysis abscess with known history of pelvic osteomyelitis. PMH including but not limited to chronic urinary retention with chronic indwelling Foley catheter in the setting of prior history of prostate cancer, DM and HLD.    Clinical Impression  Pt was seen for mobility and strength testing, and noted his difficulty with controlling STS and initial standing.  Pt is able to walk with less assist, and note his motivation is good.  Talked with him about walking with nursing assist, and then will reassess after his procedure tomorrow if goes on as planned.  May be able to go directly home if he is more comfortable afterward, and will see for therapy to try to avoid having to go to rehab.    Follow Up Recommendations SNF    Equipment Recommendations  None recommended by PT    Recommendations for Other Services       Precautions / Restrictions Precautions Precautions: Fall Precaution Comments: has help at home Restrictions Weight Bearing Restrictions: No      Mobility  Bed Mobility Overal bed mobility: Needs Assistance Bed Mobility: Supine to Sit;Sit to Supine     Supine to sit: Min assist Sit to supine: Mod assist   General bed mobility comments: lifted legs back to bed and trunk to get Out of bed along with RLE  Transfers Overall transfer level: Needs assistance Equipment used: Rolling walker (2 wheeled);1 person hand held assist Transfers: Sit to/from Stand Sit to Stand: Mod assist         General transfer comment: mod to power up but then can use walker to capture control of gait  Ambulation/Gait Ambulation/Gait assistance: Min assist;Min guard Gait Distance (Feet): 130 Feet Assistive device: Rolling walker (2  wheeled);1 person hand held assist Gait Pattern/deviations: Step-to pattern;Step-through pattern;Decreased stride length;Wide base of support;Decreased stance time - right Gait velocity: reduced Gait velocity interpretation: <1.31 ft/sec, indicative of household ambulator General Gait Details: pt is limping but trying to push himself, and talked with him about not overdoing  Stairs            Wheelchair Mobility    Modified Rankin (Stroke Patients Only)       Balance                                             Pertinent Vitals/Pain Pain Assessment: 0-10 Pain Score: 5  Pain Location: R hip and leg Pain Descriptors / Indicators: Operative site guarding Pain Intervention(s): Limited activity within patient's tolerance;Monitored during session;Premedicated before session;Repositioned    Home Living Family/patient expects to be discharged to:: Private residence Living Arrangements: Spouse/significant other Available Help at Discharge: Family Type of Home: House Home Access: Ramped entrance     Home Layout: One level Home Equipment: Bedside commode;Shower seat;Walker - 2 wheels;Cane - single point Additional Comments: pt has a wife with confusion but has niece with him as well    Prior Function Level of Independence: Independent with assistive device(s)         Comments: uses RW or SPC depending on his pain      Hand Dominance   Dominant Hand: Right  Extremity/Trunk Assessment   Upper Extremity Assessment Upper Extremity Assessment: Overall WFL for tasks assessed    Lower Extremity Assessment Lower Extremity Assessment: RLE deficits/detail RLE Deficits / Details: weak and requires assist to lift off the bed RLE: Unable to fully assess due to pain RLE Coordination: decreased fine motor;decreased gross motor    Cervical / Trunk Assessment Cervical / Trunk Assessment: Kyphotic  Communication   Communication: No difficulties   Cognition Arousal/Alertness: Awake/alert Behavior During Therapy: WFL for tasks assessed/performed Overall Cognitive Status: Within Functional Limits for tasks assessed                                        General Comments General comments (skin integrity, edema, etc.): Pt is walking with help but would be best served with rehab to get home    Exercises     Assessment/Plan    PT Assessment Patient needs continued PT services  PT Problem List Decreased strength;Decreased range of motion;Decreased activity tolerance;Decreased balance;Decreased mobility;Decreased coordination;Decreased safety awareness;Pain       PT Treatment Interventions DME instruction;Gait training;Functional mobility training;Therapeutic activities;Therapeutic exercise;Balance training;Neuromuscular re-education;Patient/family education    PT Goals (Current goals can be found in the Care Plan section)  Acute Rehab PT Goals Patient Stated Goal: to get home with wife and other family PT Goal Formulation: With patient Time For Goal Achievement: 09/26/18 Potential to Achieve Goals: Good    Frequency Min 2X/week   Barriers to discharge Decreased caregiver support home with wife who is confused but does have niece there    Co-evaluation               AM-PAC PT "6 Clicks" Mobility  Outcome Measure Help needed turning from your back to your side while in a flat bed without using bedrails?: A Little Help needed moving from lying on your back to sitting on the side of a flat bed without using bedrails?: A Lot Help needed moving to and from a bed to a chair (including a wheelchair)?: A Lot Help needed standing up from a chair using your arms (e.g., wheelchair or bedside chair)?: A Lot Help needed to walk in hospital room?: A Lot Help needed climbing 3-5 steps with a railing? : Total 6 Click Score: 12    End of Session Equipment Utilized During Treatment: Gait belt Activity Tolerance:  Patient limited by fatigue;Patient limited by pain Patient left: in bed;with call bell/phone within reach;with bed alarm set Nurse Communication: Mobility status PT Visit Diagnosis: Unsteadiness on feet (R26.81);Muscle weakness (generalized) (M62.81);Difficulty in walking, not elsewhere classified (R26.2);Pain Pain - Right/Left: Right Pain - part of body: Hip;Leg;Knee    Time: 6629-4765 PT Time Calculation (min) (ACUTE ONLY): 24 min   Charges:   PT Evaluation $PT Eval Moderate Complexity: 1 Mod PT Treatments $Gait Training: 8-22 mins       Ramond Dial 09/12/2018, 2:24 PM    Mee Hives, PT MS Acute Rehab Dept. Number: [place] and [place]

## 2018-09-12 NOTE — TOC Initial Note (Signed)
Transition of Care [facility]) - Initial/Assessment Note    Patient Details  Name: Anthony Dunlap MRN: [id number] Date of Birth: 18-Feb-1934  Transition of Care [facility]) CM/SW Contact:    Alexander Mt, [place] Phone Number: 09/12/2018, 11:54 AM  Clinical Narrative:                 CSW familiar with this patient previous admissions. I spoke with pt and pt daughter [name] via telephone. Introduced self and role. Pt from home with family support who care for him and his wife. He went to rehab at that time but transitioned home with his family. Pt daughter thinks pt is active with [facility] for foley care. Pt uses cane at home and can mobilize with that. He has a walker that he uses as needed too.   CSW and RNCM will follow   Expected Discharge Plan: [place] Barriers to Discharge: Continued Medical Work up   Patient Goals and CMS Choice Patient states their goals for this hospitalization and ongoing recovery are:: for him to get good care CMS Medicare.gov Compare Post Acute Care list provided to:: Patient Represenative (must comment)(daughter [name]) Choice offered to / list presented to : Adult Children  Expected Discharge Plan and Services Expected Discharge Plan: [place] In-house Referral: Clinical Social Work Discharge Planning Services: CM Consult Post Acute Care Choice: Home Health, Durable Medical Equipment Living arrangements for the past 2 months: [place]   Prior Living Arrangements/Services Living arrangements for the past 2 months: Single Family Home Lives with:: Adult Children, Spouse Patient language and need for interpreter reviewed:: Yes(no needs) Do you feel safe going back to the place where you live?: Yes      Need for Family Participation in Patient Care: Yes (Comment)(support with IADLs and ADLs) Care giver support system in place?: Yes (comment)(adult children) Current home services: DME, Home RN Criminal  Activity/Legal Involvement Pertinent to Current Situation/Hospitalization: No - Comment as needed  Activities of Daily Living Home Assistive Devices/Equipment: Cane (specify quad or straight), CBG Meter    Permission Sought/Granted Permission sought to share information with : Case Manager, Family Supports Permission granted to share information with : Yes, Verbal Permission Granted  Share Information with NAME: [name]  Permission granted to share info w AGENCY: [facility] granted to share info w Relationship: daughter  Permission granted to share info w Contact Information: [phone]  Emotional Assessment Appearance:: Other (Comment Required(spoke with pt and pt daughter on phone) Attitude/Demeanor/Rapport: (spoke with pt and pt daughter on phone) Affect (typically observed): (spoke with pt and pt daughter on phone) Orientation: : Oriented to Self, Oriented to Place, Oriented to  Time, Oriented to Situation Alcohol / Substance Use: Not Applicable Psych Involvement: No (comment)  Admission diagnosis:  Pelvic Abscess Patient Active Problem List   Diagnosis Date Noted  . Pelvic abscess in male [facility]) 09/11/2018  . Drug-induced hepatitis 06/05/2018  . Calculus of gallbladder without cholecystitis without obstruction   . Fungemia/Candida albicans 05/10/2018  . UTI (urinary tract infection) 05/08/2018  . Complicated UTI (urinary tract infection) 05/08/2018  . 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 Abscess of left thigh   . Pressure injury of skin 02/10/2018  . Enterococcus faecalis infection 01/27/2018  . Abscess   . Psoas abscess ([place]) 01/23/2018  . Sepsis due to Bacteroides species ([place]) 01/22/2018  . Sepsis without acute organ dysfunction ([place])   .  DKA (diabetic ketoacidoses) ([place]) 01/17/2018  . Normocytic anemia 01/17/2018  . Chronic anemia 01/03/2018  . Chronic indwelling Foley catheter 01/03/2018  . Elevated  alkaline phosphatase level 01/03/2018  . Hypothyroidism 01/03/2018  . Physical deconditioning 01/03/2018  . 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 HLD (hyperlipidemia) 08/19/2013  . Type 2 diabetes mellitus ([place])   . Prostate cancer [facility])    PCP:  [name], MD Pharmacy:   [place], [place] [address] [place] [address] Phone: [phone] Fax: [phone]     Social Determinants of Health (SDOH) Interventions    Readmission Risk Interventions Readmission Risk Prevention Plan 05/09/2018  Transportation Screening Complete  PCP or Specialist Appt within 3-5 Days Complete  HRI or [place] Complete  Social Work Consult for [place] Planning/Counseling Complete  Palliative Care Screening Not Applicable  Medication Review Press photographer) Complete  Some recent data might be hidden

## 2018-09-12 NOTE — Progress Notes (Signed)
Patient transported off unit for CT

## 2018-09-12 NOTE — TOC Initial Note (Addendum)
Transition of Care [facility]) - Initial/Assessment Note    Patient Details  Name: Anthony Dunlap MRN: [id number] Date of Birth: 1934-01-14  Transition of Care [facility]) CM/SW Contact:    Marilu Favre, RN Phone Number: 09/12/2018, 12:11 PM  Clinical Narrative:                 Patient from home with daughter [name].   Patient has been at home before with IV ABX and his daughter [name] assisted him.   Patient wants to continue with home health RN through [facility]. Joen Laura with [facility] aware and will just need [facility] order and face to face. Received orders   Pam with [facility] aware and will talk with patient and daughter [name]. Once discharge antibiotic prescription determined will need OPAT prescription.   Expected Discharge Plan: [place] Barriers to Discharge: Continued Medical Work up   Patient Goals and CMS Choice Patient states their goals for this hospitalization and ongoing recovery are:: to go home CMS Medicare.gov Compare Post Acute Care list provided to:: Patient Choice offered to / list presented to : Patient  Expected Discharge Plan and Services Expected Discharge Plan: [place] In-house Referral: Clinical Social Work Discharge Planning Services: CM Consult Post Acute Care Choice: [place] arrangements for the past 2 months: Single Family Home                 DME Arranged: N/A         HH Arranged: RN [place] Agency: [place] (now [facility]) Date [place]: 09/12/18 Time [place]: 1209 Representative spoke with at [place]: Joen Laura will need orders and face to face  Prior Living Arrangements/Services Living arrangements for the past 2 months: [place] with:: Adult Children Patient language and need for interpreter reviewed:: Yes Do you feel safe going back to the place where you live?: Yes      Need for Family  Participation in Patient Care: Yes (Comment) Care giver support system in place?: Yes (comment) Current home services: Home RN Criminal Activity/Legal Involvement Pertinent to Current Situation/Hospitalization: No - Comment as needed  Activities of Daily Living Home Assistive Devices/Equipment: Cane (specify quad or straight), CBG Meter    Permission Sought/Granted Permission sought to share information with : Case Manager, Family Supports Permission granted to share information with : Yes, Verbal Permission Granted  Share Information with NAME: [name]  Permission granted to share info w AGENCY: [facility], [facility] granted to share info w Relationship: daughter  Permission granted to share info w Contact Information: [phone]  Emotional Assessment Appearance:: Appears younger than stated age Attitude/Demeanor/Rapport: Engaged Affect (typically observed): Accepting Orientation: : Oriented to Self, Oriented to Place, Oriented to  Time, Oriented to Situation Alcohol / Substance Use: Not Applicable Psych Involvement: No (comment)  Admission diagnosis:  Pelvic Abscess Patient Active Problem List   Diagnosis Date Noted  . Pelvic abscess in male [facility]) 09/11/2018  . Drug-induced hepatitis 06/05/2018  . Calculus of gallbladder without cholecystitis without obstruction   . Fungemia/Candida albicans 05/10/2018  . UTI (urinary tract infection) 05/08/2018  . Complicated UTI (urinary tract infection) 05/08/2018  . 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 Abscess of left thigh   . Pressure injury of skin 02/10/2018  . Enterococcus faecalis infection 01/27/2018  . Abscess   . Psoas abscess (  [place]) 01/23/2018  . Sepsis due to Bacteroides species ([place]) 01/22/2018  . Sepsis without acute organ dysfunction ([place])   . DKA (diabetic ketoacidoses) ([place]) 01/17/2018  . Normocytic anemia 01/17/2018  . Chronic anemia 01/03/2018  .  Chronic indwelling Foley catheter 01/03/2018  . Elevated alkaline phosphatase level 01/03/2018  . Hypothyroidism 01/03/2018  . Physical deconditioning 01/03/2018  . 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 HLD (hyperlipidemia) 08/19/2013  . Type 2 diabetes mellitus ([place])   . Prostate cancer [facility])    PCP:  [name], MD Pharmacy:   [place], [place] [address] [place] [address] Phone: [phone] Fax: [phone]     Social Determinants of Health (SDOH) Interventions    Readmission Risk Interventions Readmission Risk Prevention Plan 05/09/2018  Transportation Screening Complete  PCP or Specialist Appt within 3-5 Days Complete  HRI or [place] Complete  Social Work Consult for [place] Planning/Counseling Complete  Palliative Care Screening Not Applicable  Medication Review Press photographer) Complete  Some recent data might be hidden

## 2018-09-12 NOTE — Consult Note (Signed)
Chief Complaint: Patient was seen in consultation today for pubic symphysis fluid collection/aspiration.  Referring Physician(s): Elgergawy, Silver Huguenin  Supervising Physician: Corrie Mckusick  Patient Status: [facility] - In-pt  History of Present Illness: Anthony Dunlap is a 85 y.o. 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 He has completed prolonged IV antibiotic course (via PICC line) for pelvic osteomyelitis. He presented to ID clinic 09/11/2018 with complaint of worsening right hip pain. Of note, he underwent MR pelvis 09/05/2018 which revealed new pubic symphysis fluid collection concerning for abscess. He was directly admitted to [facility] 09/11/2018 for further management. ID was consulted who recommended IR consult for possible aspiration of fluid collection.  MR pelvis 09/05/2018: 1. Osteomyelitis about the symphysis pubis demonstrates some improvement, particularly in the superior pubic rami. There has been only minimal improvement in signal abnormality in the inferior pubic rami. 2. Tiny fluid collection in the symphysis pubis is new since the prior examination worrisome for abscess. 3. No change in severe right and moderately severe left hip osteoarthritis.  CT pelvis this AM: 1. Again noted are changes of osteomyelitis within the pubic bones, adjacent inferior and superior pubic rami bilaterally, stable since recent MRI. 2. Widening and irregularity of the pubic symphysis with soft tissue noted anteriorly. This could be related to septic arthritis or abscess. Again, findings similar to prior MRI. 3. Aortoiliac atherosclerosis. 4. Moderate stool burden throughout the colon.  IR requested by Dr. Waldron [facility] for possible image-guided aspiration of pubic symphysis  fluid collection. Patient awake and alert sitting in bed watching TV. Complains of right hip/pelvic pain, stable at this time. Denies fever, chills, chest pain, dyspnea, abdominal pain, or headache.   Past Medical History:  Diagnosis Date  . 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 Diabetes ([place])   . Drug-induced hepatitis 06/05/2018  . Left eye pain   . Prostate cancer [facility])     Past Surgical History:  Procedure Laterality Date  . HERNIA REPAIR    . IR RADIOLOGIST EVAL & MGMT  03/06/2018  . PROSTATE SURGERY      Allergies: Fluconazole  Medications: Prior to Admission medications   Medication Sig Start Date End Date Taking? Authorizing Provider  acetaminophen (TYLENOL) 325 MG tablet Take 2 tablets (650 mg total) by mouth every 6 (six) hours as needed for mild pain, fever or headache. 02/18/18  Yes Oswald Hillock, MD  aspirin EC 81 MG tablet Take 81 mg by mouth daily.   Yes [provider]  furosemide (LASIX) 20 MG tablet Take 20 mg by mouth daily as needed for fluid.   Yes [provider]  glimepiride (AMARYL) 4 MG tablet Take 4 mg by mouth daily.  04/18/18  Yes [provider]  levothyroxine (SYNTHROID, LEVOTHROID) 100 MCG tablet Take 100 mcg by mouth daily.  04/18/18  Yes [provider]  meloxicam (MOBIC) 7.5 MG tablet Take 7.5 mg by mouth 2 (two) times daily.  04/18/18  Yes [provider]  Multiple Vitamin (MULTIVITAMIN WITH MINERALS) TABS tablet Take 1 tablet by mouth daily. 02/19/18  Yes Oswald Hillock, MD  nystatin (MYCOSTATIN/NYSTOP) powder Apply topically 4 (four) times daily.   Yes [provider]  pravastatin (PRAVACHOL) 10 MG tablet Take 10 mg by mouth daily.   Yes [provider]  SOLIQUA 100-33 UNT-MCG/ML SOPN Inject 45 Units as directed daily.  04/28/18  Yes [provider]  ferrous sulfate 325 (65 FE) MG tablet Take 1 tablet (325 mg total) by mouth daily with  breakfast. Patient not taking: Reported on 09/11/2018 02/19/18   Oswald Hillock, MD  insulin aspart (NOVOLOG) 100 UNIT/ML injection Inject 0-15 Units into the skin 3 (three) times daily with meals. Sliding scale insulin Less than 70 initiate hypoglycemia protocol 70-120  0 units 120-150 2 unit 151-200 3 units 201-250 3 units 251-300 5 units 301-350 8 units 351-400 11 units  Greater than 400 15 units , call MD Patient not taking: Reported on 09/11/2018 02/18/18   Oswald Hillock, MD  insulin detemir (LEVEMIR) 100 UNIT/ML injection Inject 0.06 mLs (6 Units total) into the skin daily. Patient not taking: Reported on 09/11/2018 05/15/18   Heath Lark D, DO  magnesium oxide (MAG-OX) 400 (241.3 Mg) MG tablet Take 1 tablet (400 mg total) by mouth daily. Patient not taking: Reported on 09/11/2018 02/19/18   Oswald Hillock, MD  nutrition supplement, JUVEN, Fanny Dance) PACK Take 1 packet by mouth 2 (two) times daily between meals. Patient not taking: Reported on 09/11/2018 02/19/18   Oswald Hillock, MD     Family History  Problem Relation Age of Onset  . Pneumonia Father   . Cancer Sister     Social History   Socioeconomic History  . Marital status: Married    Spouse name: [name]  . Number of children: 3  . Years of education: 9th  . Highest education level: Not on file  Occupational History    Employer: RETIRED    Comment: Retired  Scientific laboratory technician  . Financial resource strain: Not on file  . Food insecurity    Worry: Not on file    Inability: Not on file  . Transportation needs    Medical: Not on file    Non-medical: Not on file  Tobacco Use  . Smoking status: Never Smoker  . Smokeless tobacco: Never Used  Substance and Sexual Activity  . Alcohol use: No  . Drug use: No  . Sexual activity: Not on file  Lifestyle  . Physical activity    Days per week: Not on file    Minutes per session: Not on file  . Stress: Not on file  Relationships  . Social Herbalist on phone: Not on file     Gets together: Not on file    Attends religious service: Not on file    Active member of club or organization: Not on file    Attends meetings of clubs or organizations: Not on file    Relationship status: Not on file  Other Topics Concern  . Not on file  Social History Narrative   Patient lives at home with his wife. [name]) . Patient is retired.   Education 9th grade.   Right handed.   Caffeine None     Review of Systems: A 12 point ROS discussed and pertinent positives are indicated in the HPI above.  All other systems are negative.  Review of Systems  Constitutional: Negative for chills and fever.  Respiratory: Negative for shortness of breath and wheezing.   Cardiovascular: Negative for chest pain and palpitations.  Gastrointestinal: Negative for abdominal pain.       Positive for right pelvic pain.  Musculoskeletal:       Positive for right hip pain.  Neurological: Negative for headaches.  Psychiatric/Behavioral: Negative for behavioral problems and confusion.    Vital Signs: BP (!) 144/85 (BP Location: Right Arm)   Pulse 97   Temp 98.3 F (36.8 C) (Oral)   Resp 18   SpO2 96%   Physical Exam Vitals signs and nursing note reviewed.  Constitutional:      General: He is not in acute distress.    Appearance: Normal appearance.  Cardiovascular:     Rate and Rhythm: Normal rate and regular rhythm.     Heart sounds: Normal heart sounds. No murmur.  Pulmonary:     Effort: Pulmonary effort is normal. No respiratory distress.     Breath sounds: Normal breath sounds. No wheezing.  Skin:    General: Skin is warm and dry.  Neurological:     Mental Status: He is alert and oriented to person, place, and time.  Psychiatric:        Mood and Affect: Mood normal.        Behavior: Behavior normal.        Thought Content: Thought content normal.        Judgment: Judgment normal.      MD Evaluation Airway: WNL Heart: WNL Abdomen: WNL Chest/ Lungs: WNL ASA   Classification: 3 Mallampati/Airway Score: Two   Imaging: Ct Pelvis W Contrast  Result Date: 09/12/2018 CLINICAL DATA:  Osteomyelitis suspected EXAM: CT PELVIS WITH CONTRAST TECHNIQUE: Multidetector CT imaging of the pelvis was performed using the standard protocol following the bolus administration of intravenous contrast. CONTRAST:  156mL ISOVUE-300 IOPAMIDOL (ISOVUE-300) INJECTION 61% COMPARISON:  MRI 09/05/2018 FINDINGS: Urinary Tract: Foley catheter present in the bladder which is decompressed. Ureters are decompressed. Small cysts in the midpole of both kidneys. No hydronephrosis. Bowel: Moderate stool burden throughout the colon. Normal appendix. Visualized small bowel decompressed, grossly unremarkable. Vascular/Lymphatic: Aortoiliac atherosclerosis. No aneurysm or adenopathy. Reproductive:  Radiation seeds in the region of the prostate. Other:  No free fluid or free air. Musculoskeletal: Changes of osteomyelitis noted about the pubic symphysis, extending into the superior and inferior pubic rami. There is widening of the pubic symphysis with soft tissue extending anteriorly. This could reflect septic arthritis or abscess. IMPRESSION: Again noted are changes of osteomyelitis within the pubic bones, adjacent inferior and superior pubic rami bilaterally, stable since recent MRI. Widening and irregularity of the pubic symphysis with soft tissue noted anteriorly. This could be related to septic arthritis or abscess. Again, findings similar to prior MRI. Aortoiliac atherosclerosis. Moderate stool burden throughout the colon. Electronically Signed   By: Rolm Baptise M.D.   On: 09/12/2018 09:50   Mr Pelvis W OX Contrast  Result Date: 09/05/2018 CLINICAL DATA:  Right hip and leg pain. History of prostate cancer. History of osteomyelitis about the symphysis pubis. EXAM: MRI PELVIS WITHOUT AND WITH CONTRAST TECHNIQUE: Multiplanar, multisequence MR imaging of the pelvis was performed both before and after  administration of intravenous contrast. CONTRAST:  14 mL MULTIHANCE GADOBENATE DIMEGLUMINE 529 MG/ML IV SOLN COMPARISON:  MRI of the pelvis 03/28/2018. FINDINGS: Bones/Joint/Cartilage Edema and enhancement about the symphysis pubis and pubic rami persist but have improved, particularly about the symphysis pubis and in the superior pubic rami. There has been only mild improvement in the appearance of the inferior pubic rami. No new focus of marrow signal abnormality is identified. There is a small fluid collection at the symphysis pubis measuring 1.1 cm AP by 0.6 cm transverse by 0.9 cm craniocaudal which is new since the prior MRI. Severe right and moderate  to moderately severe left hip osteoarthritis appears unchanged. No hip joint effusion. SI joints unremarkable. Ligaments Intact. Muscles and Tendons No intramuscular abscess.  No tear or strain. Soft tissues Foley catheter remains in place.  No new abnormality in the pelvis. IMPRESSION: Osteomyelitis about the symphysis pubis demonstrates some improvement, particularly in the superior pubic rami. There has been only minimal improvement in signal abnormality in the inferior pubic rami. Tiny fluid collection in the symphysis pubis is new since the prior examination worrisome for abscess. No change in severe right and moderately severe left hip osteoarthritis. Electronically Signed   By: Inge Rise M.D.   On: 09/05/2018 14:56    Labs:  CBC: Recent Labs    05/14/18 0440 08/12/18 1116 09/11/18 1424 09/12/18 0419  WBC 16.5* 11.1* 9.1 8.2  HGB 10.1* 9.7* 10.1* 9.8*  HCT 32.2* 30.1* 32.9* 31.2*  PLT 331 431* 467* 405*    COAGS: Recent Labs    01/17/18 0443  02/14/18 0318 05/08/18 1151 09/11/18 1424 09/12/18 0419  INR 1.31   < > 1.23 1.1 1.0 1.0  APTT 37*  --   --   --   --   --    < > = values in this interval not displayed.    BMP: Recent Labs    05/14/18 0440 08/12/18 1116 09/11/18 1424 09/12/18 0419  NA 133* 136 139 139  K  3.5 4.8 4.7 4.3  CL 106 102 103 105  CO2 20* 25 25 25   GLUCOSE 152* 284* 225* 182*  BUN 40* 30* 23 24*  CALCIUM 8.6* 9.3 9.2 9.0  CREATININE 1.07 1.27* 1.17 1.16  GFRNONAA >60 52* 57* 58*  GFRAA >60 60 >60 >60    LIVER FUNCTION TESTS: Recent Labs    05/08/18 1152 05/12/18 0431 05/13/18 0442 05/14/18 0440 08/12/18 1116  BILITOT 0.6 2.5* 2.7* 2.6* 0.4  AST 50* 164* 103* 71* 15  ALT 30 164* 128* 109* 12  ALKPHOS 121 521* 534* 578*  --   PROT 6.9 6.2* 6.2* 6.6 7.6  ALBUMIN 2.9* 2.3* 2.2* 2.4*  --      Assessment and Plan:  Pubic symphysis fluid collection. Plan for image-guided aspiration of pubic symphysis fluid collection tentative for today with Dr. Earleen Newport. Patient is NPO. Afebrile and WBCs WNL. He does not take blood thinners. INR 1.0 today.  Risks and benefits discussed with the patient including bleeding, infection, damage to adjacent structures, and sepsis. All of the patient's questions were answered, patient is agreeable to proceed. Consent signed and in chart.   Thank you for this interesting consult.  I greatly enjoyed meeting Anthony Dunlap and look forward to participating in their care.  A copy of this report was sent to the requesting provider on this date.  Electronically Signed: Earley Abide, PA-C 09/12/2018, 11:48 AM   I spent a total of 40 Minutes in face to face in clinical consultation, greater than 50% of which was counseling/coordinating care for pubic symphysis fluid collection/aspiration.

## 2018-09-13 ENCOUNTER — Inpatient Hospital Stay: Payer: Self-pay

## 2018-09-13 ENCOUNTER — Inpatient Hospital Stay (HOSPITAL_COMMUNITY): Payer: Medicare Other

## 2018-09-13 LAB — CBC
HCT: 33.3 % — ABNORMAL LOW (ref 39.0–52.0)
Hemoglobin: 10.2 g/dL — ABNORMAL LOW (ref 13.0–17.0)
MCH: 26.4 pg (ref 26.0–34.0)
MCHC: 30.6 g/dL (ref 30.0–36.0)
MCV: 86 fL (ref 80.0–100.0)
Platelets: 493 10*3/uL — ABNORMAL HIGH (ref 150–400)
RBC: 3.87 MIL/uL — ABNORMAL LOW (ref 4.22–5.81)
RDW: 12.7 % (ref 11.5–15.5)
WBC: 9.3 10*3/uL (ref 4.0–10.5)
nRBC: 0 % (ref 0.0–0.2)

## 2018-09-13 LAB — BASIC METABOLIC PANEL
Anion gap: 11 (ref 5–15)
BUN: 19 mg/dL (ref 8–23)
CO2: 21 mmol/L — ABNORMAL LOW (ref 22–32)
Calcium: 9.2 mg/dL (ref 8.9–10.3)
Chloride: 106 mmol/L (ref 98–111)
Creatinine, Ser: 1.11 mg/dL (ref 0.61–1.24)
GFR calc Af Amer: >60 mL/min (ref >60)
GFR calc non Af Amer: >60 mL/min (ref >60)
Glucose, Bld: 226 mg/dL — ABNORMAL HIGH (ref 70–99)
Potassium: 3.9 mmol/L (ref 3.5–5.1)
Sodium: 138 mmol/L (ref 135–145)

## 2018-09-13 MED ORDER — LIDOCAINE HCL 1 % IJ SOLN
INTRAMUSCULAR | Status: AC
  Filled 2018-09-13: qty 20

## 2018-09-13 MED ORDER — SODIUM CHLORIDE 0.9 % IV SOLN
3.0000 g | Freq: Three times a day (TID) | INTRAVENOUS | Status: DC
  Administered 2018-09-13 – 2018-09-14 (×3): 3 g via INTRAVENOUS
  Filled 2018-09-13: qty 3
  Filled 2018-09-13 (×2): qty 8
  Filled 2018-09-13 (×2): qty 3
  Filled 2018-09-13: qty 8

## 2018-09-13 NOTE — Progress Notes (Addendum)
Pharmacy Antibiotic Note  Anthony Dunlap is a 85 y.o. male admitted on 09/11/2018 with osteomyelitis of pubic symphysis.  Pharmacy has been consulted for Unasyn dosing.  Aspiration of possible abscess in IR, sent for cx, Blood cx ordered Prev tx of same with 8 wks Unasyn,( Zosyn x 2 days) ended Feb 4th, re-admit 7/24 with pelvic pain  Plan: Unasyn 3gm q8h OPAT ordered, Unasyn to continue as outpt x 8 wk, ending 9/18 - Plan PICC with negative blood cx, and can discharge, following cx as outpt Previous Unasyn dose 3gm q6, with SCr avg 0.8; SCr elevated since 6/23.     Temp (24hrs), Avg:98.4 F (36.9 C), Min:98.2 F (36.8 C), Max:98.6 F (37 C)  Recent Labs  Lab 09/11/18 1424 09/12/18 0419 09/13/18 0847  WBC 9.1 8.2 9.3  CREATININE 1.17 1.16 1.11    Estimated Creatinine Clearance: 48.8 mL/min (by C-G formula based on SCr of 1.11 mg/dL).    Allergies  Allergen Reactions  . Fluconazole Other (See Comments)    Drug-induced hepatitis    Antimicrobials this admission: 7/25 Unasyn >>   Dose adjustments this admission:   Microbiology results: 7/23 BCx: ngtd 7/25 abscess Cx: sent  Thank you for allowing pharmacy to be a part of this patient's care.  Minda Ditto PharmD 09/13/2018 1:36 PM

## 2018-09-13 NOTE — Progress Notes (Signed)
PROGRESS NOTE [facility]   Anthony Dunlap   SAY:[id number] DOB: [birth year]  DOA: 09/11/2018 PCP: [name], MD   Brief Narrative:  Per HPI: Anthony Dunlap [phone] y.o.male,history of chronic urinary retention with chronic indwelling Foley catheter in the setting of prior history of prostate cancer,diabetes mellitus, hyperlipidemia,with history of polymicrobial infection with pelvic osteomyelitis of the pubic symphysis and abscess in the abductor sheath and left thigh abscess, history of candidemia in the past, who finished prolonged IV antibiotic course through PICC line,patient presents to ID clinic secondary to worsening right hip pain, last June, he had MRI pelvis obtained, which did show new pubic symphysis abscess, so ID requested direct admission for culture and IV antibiotic initiation,the patient has been having worsening right hip pain, he is less ambulatory, but he denies fever, chills, chest pain, shortness of breath, patient was accepted as direct admission.  Patient was admitted with pubic symphysis abscess in the setting of known history of pelvic osteomyelitis.  He was a direct admission from ID clinic.  Plans are for aspirate of abscess per IR today.  ID to follow.  COVID testing negative    Subjective: Patient seen and examined, he is doing well, has no complaints.  Awaiting for CT-guided aspiration of the pubic symphysis.  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  IR plan to obtain CT-guided aspiration for  cultures.  IV antibiotics on hold until culture has been done.  ID following.  After CT aspiration will place PICC line and start antibiotics per ID recommendations.  Continue to monitor.  Diabetes mellitus type 2 stable Continue long-acting insulin and insulin sliding scale while in the hospital.  Chronic anemia Stable, monitor CBC in the morning  Hypothyroidism Stable continue current medications  Hyperlipidemia   Stable continue home meds.  DVT prophylaxis: SCDs Code Status: Full code Family Communication: None at bedside Disposition Plan: Home in a.m. after PICC line placed.  Per ID/sulbactam for 8 weeks.  Consultants:   ID  IR  Procedures:   CT-guided pubic symphysis aspiration 7/25  Antimicrobials: Anti-infectives (From admission, onward)   Start     Dose/Rate Route Frequency Ordered Stop   09/13/18 1400  Ampicillin-Sulbactam (UNASYN) 3 g in sodium chloride 0.9 % 100 mL IVPB     3 g 200 mL/hr over 30 Minutes Intravenous Every 8 hours 09/13/18 1336            Objective: Vitals:   09/12/18 0425 09/12/18 1438 09/13/18 0511 09/13/18 1605  BP: (!) 144/85 140/89 (!) 150/88 (!) 149/88  Pulse: 97 96 97 93  Resp: 18 18 17 17   Temp: 98.3 F (36.8 C) 98.6 F (37 C) 98.2 F (36.8 C) 98 F (36.7 C)  TempSrc: Oral Oral Oral Oral  SpO2: 96% 99% 97% 99%    Intake/Output Summary (Last 24 hours) at 09/13/2018 1634 Last data filed at 09/13/2018 1500 Gross per 24 hour  Intake 1851.92 ml  Output 2100 ml  Net -248.08 ml   There were no vitals filed for this visit.  Examination:  General exam: Appears calm and comfortable  HEENT: AC/AT, PERRLA, OP moist and clear Respiratory system: Clear to auscultation. No wheezes,crackle or rhonchi Cardiovascular system: S1 & S2 heard, RRR. No JVD, murmurs, rubs or gallops Gastrointestinal system: Abdomen is nondistended, soft and nontender. No organomegaly or masses felt. Normal bowel sounds heard. Central nervous system:  Alert and oriented. No focal neurological deficits. Extremities: No pedal edema. Symmetric, strength 5/5   Skin: No rashes, lesions or ulcers Psychiatry: Judgement and insight appear normal. Mood & affect appropriate.    Data Reviewed: I have personally reviewed following labs and imaging studies  CBC: Recent Labs  Lab 09/11/18 1424 09/12/18 0419 09/13/18 0847  WBC 9.1 8.2 9.3  HGB 10.1* 9.8* 10.2*  HCT 32.9* 31.2* 33.3*  MCV 85.9 85.0 86.0  PLT 467* 405* 638*   Basic Metabolic Panel: Recent Labs  Lab 09/11/18 1424 09/12/18 0419 09/13/18 0847  NA 139 139 138  K 4.7 4.3 3.9  CL 103 105 106  CO2 25 25 21*  GLUCOSE 225* 182* 226*  BUN 23 24* 19  CREATININE 1.17 1.16 1.11  CALCIUM 9.2 9.0 9.2   GFR: Estimated Creatinine Clearance: 48.8 mL/min (by C-G formula based on SCr of 1.11 mg/dL). Liver Function Tests: No results for input(s): AST, ALT, ALKPHOS, BILITOT, PROT, ALBUMIN in the last 168 hours. No results for input(s): LIPASE, AMYLASE in the last 168 hours. No results for input(s): AMMONIA in the last 168 hours. Coagulation Profile: Recent Labs  Lab 09/11/18 1424 09/12/18 0419  INR 1.0 1.0   Cardiac Enzymes: No results for input(s): CKTOTAL, CKMB, CKMBINDEX, TROPONINI in the last 168 hours. BNP (last 3 results) No results for input(s): PROBNP in the last 8760 hours. HbA1C: Recent Labs    09/12/18 0419  HGBA1C 7.7*   CBG: Recent Labs  Lab 09/11/18 1717 09/11/18 2114 09/12/18 0745 09/12/18 1156  GLUCAP 108* 157* 147* 129*   Lipid Profile: No results for input(s): CHOL, HDL, LDLCALC, TRIG, CHOLHDL, LDLDIRECT in the last 72 hours. Thyroid Function Tests: No results for input(s): TSH, T4TOTAL, FREET4, T3FREE, THYROIDAB in the last 72 hours. Anemia Panel: No results for input(s): VITAMINB12, FOLATE, FERRITIN, TIBC, IRON, RETICCTPCT in the last 72 hours. Sepsis Labs: No results for input(s): PROCALCITON, LATICACIDVEN in the last 168 hours.  Recent  Results (from the past 240 hour(s))  SARS Coronavirus 2 (CEPHEID - Performed in [place] hospital lab), Hosp Order     Status: None   Collection Time: 09/11/18  2:10 PM   Specimen: Nasopharyngeal Swab  Result Value Ref Range Status   SARS Coronavirus 2 NEGATIVE NEGATIVE Final    Comment: (NOTE) If result is NEGATIVE SARS-CoV-2 target nucleic acids are NOT DETECTED. The SARS-CoV-2 RNA is generally detectable in upper and lower  respiratory specimens during the acute phase of infection. The lowest  concentration of SARS-CoV-2 viral copies this assay can detect is 250  copies / mL. A negative result does not preclude SARS-CoV-2 infection  and should not be used as the sole basis for treatment or other  patient management decisions.  A negative result may occur with  improper specimen collection / handling, submission of specimen other  than nasopharyngeal swab, presence of viral mutation(s) within the  areas targeted by this assay, and inadequate number of viral copies  (<250 copies / mL). A negative result must be combined with clinical  observations, patient history, and epidemiological information. If result is POSITIVE SARS-CoV-2 target nucleic acids are DETECTED. The SARS-CoV-2  RNA is generally detectable in upper and lower  respiratory specimens dur ing the acute phase of infection.  Positive  results are indicative of active infection with SARS-CoV-2.  Clinical  correlation with patient history and other diagnostic information is  necessary to determine patient infection status.  Positive results do  not rule out bacterial infection or co-infection with other viruses. If result is PRESUMPTIVE POSTIVE SARS-CoV-2 nucleic acids MAY BE PRESENT.   A presumptive positive result was obtained on the submitted specimen  and confirmed on repeat testing.  While 2019 novel coronavirus  (SARS-CoV-2) nucleic acids may be present in the submitted sample  additional confirmatory testing may  be necessary for epidemiological  and / or clinical management purposes  to differentiate between  SARS-CoV-2 and other Sarbecovirus currently known to infect humans.  If clinically indicated additional testing with an alternate test  methodology [phone]) is advised. The SARS-CoV-2 RNA is generally  detectable in upper and lower respiratory sp ecimens during the acute  phase of infection. The expected result is Negative. Fact Sheet for Patients:  StrictlyIdeas.no Fact Sheet for Healthcare Providers: BankingDealers.co.za This test is not yet approved or cleared by the [place] FDA and has been authorized for detection and/or diagnosis of SARS-CoV-2 by FDA under an Emergency Use Authorization (EUA).  This EUA will remain in effect (meaning this test can be used) for the duration of the COVID-19 declaration under Section 564(b)(1) of the Act, 21 U.S.C. section 360bbb-3(b)(1), unless the authorization is terminated or revoked sooner. Performed at [facility], [place] [address] [place], [place] [address]   Culture, blood (routine x 2)     Status: None (Preliminary result)   Collection Time: 09/11/18  2:30 PM   Specimen: BLOOD  Result Value Ref Range Status   Specimen Description BLOOD RIGHT ANTECUBITAL  Final   Special Requests   Final    BOTTLES DRAWN AEROBIC AND ANAEROBIC Blood Culture adequate volume   Culture   Final    NO GROWTH 2 DAYS Performed at [facility], [place] [address], [place], [place] [address]    Report Status PENDING  Incomplete  Culture, blood (routine x 2)     Status: None (Preliminary result)   Collection Time: 09/11/18  2:31 PM   Specimen: BLOOD RIGHT FOREARM  Result Value Ref Range Status   Specimen Description BLOOD RIGHT FOREARM  Final   Special Requests   Final    BOTTLES DRAWN AEROBIC AND ANAEROBIC Blood Culture results may not be optimal due to an excessive volume of blood received in culture  bottles   Culture   Final    NO GROWTH 2 DAYS Performed at [facility], [place] [address], [place], [place] [address]    Report Status PENDING  Incomplete  Aerobic/Anaerobic Culture (surgical/deep wound)     Status: None (Preliminary result)   Collection Time: 09/13/18  1:04 PM   Specimen: Abscess  Result Value Ref Range Status   Specimen Description ABSCESS PUBIC SYMPHYSIS  Final   Special Requests Normal  Final   Gram Stain   Final    RARE WBC PRESENT, PREDOMINANTLY PMN NO ORGANISMS SEEN Performed at [facility], [place] [address] [place] [address] [place], [place] [address]    Culture PENDING  Incomplete   Report Status PENDING  Incomplete      Radiology Studies: Ct Pelvis W Contrast  Result Date: 09/12/2018 CLINICAL DATA:  Osteomyelitis suspected EXAM: CT PELVIS WITH CONTRAST TECHNIQUE: Multidetector CT  imaging of the pelvis was performed using the standard protocol following the bolus administration of intravenous contrast. CONTRAST:  163mL ISOVUE-300 IOPAMIDOL (ISOVUE-300) INJECTION 61% COMPARISON:  MRI 09/05/2018 FINDINGS: Urinary Tract: Foley catheter present in the bladder which is decompressed. Ureters are decompressed. Small cysts in the midpole of both kidneys. No hydronephrosis. Bowel: Moderate stool burden throughout the colon. Normal appendix. Visualized small bowel decompressed, grossly unremarkable. Vascular/Lymphatic: Aortoiliac atherosclerosis. No aneurysm or adenopathy. Reproductive:  Radiation seeds in the region of the prostate. Other:  No free fluid or free air. Musculoskeletal: Changes of osteomyelitis noted about the pubic symphysis, extending into the superior and inferior pubic rami. There is widening of the pubic symphysis with soft tissue extending anteriorly. This could reflect septic arthritis or abscess. IMPRESSION: Again noted are changes of osteomyelitis within the pubic bones, adjacent inferior and superior pubic rami bilaterally, stable since recent MRI. Widening  and irregularity of the pubic symphysis with soft tissue noted anteriorly. This could be related to septic arthritis or abscess. Again, findings similar to prior MRI. Aortoiliac atherosclerosis. Moderate stool burden throughout the colon. Electronically Signed   By: Rolm Baptise M.D.   On: 09/12/2018 09:50   Ct Aspiration  Result Date: 09/13/2018 INDICATION: History osteomyelitis about the pubic symphysis. Please perform CT-guided aspiration for diagnostic purposes. EXAM: CT GUIDANCE NEEDLE PLACEMENT COMPARISON:  Pelvic CT-09/12/2018; pelvic MRI-09/05/2018 MEDICATIONS: The patient is currently admitted to the hospital and receiving intravenous antibiotics. The antibiotics were administered within an appropriate time frame prior to the initiation of the procedure. ANESTHESIA/SEDATION: None CONTRAST:  None COMPLICATIONS: None immediate. PROCEDURE: Informed written consent was obtained from the patient after a discussion of the risks, benefits and alternatives to treatment. The patient was placed supine on the CT gantry and a pre procedural CT was performed re-demonstrating lytic destruction about the pubic symphysis compatible with osteomyelitis. The procedure was planned. A timeout was performed prior to the initiation of the procedure. The skin overlying the ventral aspect of the pubis was prepped and draped in the usual sterile fashion. The overlying soft tissues were anesthetized with 1% lidocaine with epinephrine. Appropriate trajectory was planned with the use of a 22 gauge spinal needle. An 18 gauge trocar needle was advanced into the pubic symphysis. Appropriate positioning was confirmed with a limited postprocedural CT scan. No significant fluid was able to be aspirated from the pubic symphysis therefore approximately 2 cc of saline was administered and subsequently aspirated. All aspirated fluid was combined with 10 cc of saline, capped and submitted to the laboratory for analysis. A dressing was placed.  The patient tolerated the procedure well without immediate post procedural complication. IMPRESSION: Technically successful CT-guided aspiration of the pubic symphysis. No significant fluid was able to be aspirated from the pubic symphysis therefore 2 cc of saline was administered and subsequently aspirated, capped and submitted to the laboratory for analysis. Electronically Signed   By: Sandi Mariscal M.D.   On: 09/13/2018 13:39   [place] Ekg Site Rite  Result Date: 09/13/2018 If Site Rite image not attached, placement could not be confirmed due to current cardiac rhythm.     Scheduled Meds: . ferrous sulfate  325 mg Oral Q breakfast  . insulin aspart  0-5 Units Subcutaneous QHS  . insulin aspart  0-9 Units Subcutaneous TID WC  . levothyroxine  100 mcg Oral Q0600  . magnesium oxide  400 mg Oral Daily  . multivitamin with minerals  1 tablet Oral Daily  . nutrition supplement (JUVEN)  1  packet Oral BID BM  . pravastatin  10 mg Oral q1800   Continuous Infusions: . sodium chloride Stopped (09/13/18 1439)  . ampicillin-sulbactam (UNASYN) IV 200 mL/hr at 09/13/18 1500     LOS: 2 days    Time spent: Total of 25 minutes spent with pt, greater than 50% of which was spent in discussion of  treatment, counseling and coordination of care   Chipper Oman, MD 1  How to contact the [facility] Attending or Consulting provider [place] or covering provider during after hours [place], for this patient?  1. Check the care team in [facility] and look for a) attending/consulting TRH provider listed and b) the [facility] team listed 2. Log into [email or web address] and use [place]'s universal password to access. If you do not have the password, please contact the hospital operator. 3. Locate the [facility] provider you are looking for under [facility] and page to a number that you can be directly reached. 4. If you still have difficulty reaching the provider, please page the [facility] (Director on Call) for the Hospitalists listed on amion  for assistance.

## 2018-09-13 NOTE — Procedures (Signed)
Pre procedural Dx: Osteomyelitis involving the pubic symphysis Post procedural Dx: Same  Technically successful CT guided aspiration of less than 1 cc of bloody fluid from the pubic symphysis.  All aspirated samples sent to the laboratory for analysis.    EBL: None Complications: None immediate  Ronny Bacon, MD Pager #: [phone]

## 2018-09-13 NOTE — Progress Notes (Signed)
PHARMACY CONSULT NOTE FOR:  OUTPATIENT  PARENTERAL ANTIBIOTIC THERAPY (OPAT)  Indication: Osteomyelitis of pubic symphysis Regimen: Unasyn 3gm q8 End date: 11/07/2018  IV antibiotic discharge orders are pended. To discharging provider:  please sign these orders via discharge navigator,  Select New Orders & click on the button choice - Manage This Unsigned Work.     Thank you for allowing pharmacy to be a part of this patient's care.  Minda Ditto PharmD 09/13/2018, 2:01 PM

## 2018-09-13 NOTE — Progress Notes (Signed)
Spoke with Dr Quincy Simmonds re d/c plans.  States plans for d/c home  09/14/18.  PICC placement for 09/14/18 am.  Percell Locus RN notified of plan and will let pt know.

## 2018-09-13 NOTE — Progress Notes (Signed)
Now s/p aspiration.  I have started amp/sulbactam.  I will order picc line with negative blood cultures.  OK from ID standpoint for discharge once picc line is placed.  We can monitor cultures as an outpatient.   Will plan for 8 weeks of IV antibiotics through September 18th.  Antibiotics will be changed by home health with any positive findings otherwise. I will place opat.  I will continue to follow while here.  [name], MD

## 2018-09-14 LAB — CBC WITH DIFFERENTIAL/PLATELET
Abs Immature Granulocytes: 0.04 10*3/uL (ref 0.00–0.07)
Basophils Absolute: 0 10*3/uL (ref 0.0–0.1)
Basophils Relative: 1 %
Eosinophils Absolute: 0.5 10*3/uL (ref 0.0–0.5)
Eosinophils Relative: 7 %
HCT: 31.2 % — ABNORMAL LOW (ref 39.0–52.0)
Hemoglobin: 9.8 g/dL — ABNORMAL LOW (ref 13.0–17.0)
Immature Granulocytes: 1 %
Lymphocytes Relative: 23 %
Lymphs Abs: 1.8 10*3/uL (ref 0.7–4.0)
MCH: 26.6 pg (ref 26.0–34.0)
MCHC: 31.4 g/dL (ref 30.0–36.0)
MCV: 84.6 fL (ref 80.0–100.0)
Monocytes Absolute: 0.6 10*3/uL (ref 0.1–1.0)
Monocytes Relative: 8 %
Neutro Abs: 4.8 10*3/uL (ref 1.7–7.7)
Neutrophils Relative %: 60 %
Platelets: 431 10*3/uL — ABNORMAL HIGH (ref 150–400)
RBC: 3.69 MIL/uL — ABNORMAL LOW (ref 4.22–5.81)
RDW: 12.7 % (ref 11.5–15.5)
WBC: 7.8 10*3/uL (ref 4.0–10.5)
nRBC: 0 % (ref 0.0–0.2)

## 2018-09-14 LAB — BASIC METABOLIC PANEL
Anion gap: 8 (ref 5–15)
BUN: 26 mg/dL — ABNORMAL HIGH (ref 8–23)
CO2: 23 mmol/L (ref 22–32)
Calcium: 8.9 mg/dL (ref 8.9–10.3)
Chloride: 106 mmol/L (ref 98–111)
Creatinine, Ser: 1.14 mg/dL (ref 0.61–1.24)
GFR calc Af Amer: >60 mL/min (ref >60)
GFR calc non Af Amer: 59 mL/min — ABNORMAL LOW (ref >60)
Glucose, Bld: 280 mg/dL — ABNORMAL HIGH (ref 70–99)
Potassium: 4.4 mmol/L (ref 3.5–5.1)
Sodium: 137 mmol/L (ref 135–145)

## 2018-09-14 MED ORDER — SODIUM CHLORIDE 0.9% FLUSH: 10.0000 mL | INTRAVENOUS | Status: DC | PRN

## 2018-09-14 MED ORDER — AMPICILLIN-SULBACTAM IV (FOR PTA / DISCHARGE USE ONLY): 3.0000 g | Freq: Three times a day (TID) | INTRAVENOUS | 0 refills | Status: DC

## 2018-09-14 NOTE — TOC Transition Note (Signed)
Transition of Care [facility]) - CM/SW Discharge Note   Patient Details  Name: KEAGHAN STATON MRN: [id number] Date of Birth: May 08, 1933  Transition of Care [facility]) CM/SW Contact:  Claudie Leach, RN Phone Number: 09/14/2018, 12:09 PM   Clinical Narrative:    Patient to d/c home today after 2pm Unasyn dose.  Patient and daughter will administer 10 pm dose tonight at home.  [facility] and [place] RNs are aware of plan.     Final next level of care: [place] Barriers to Discharge: Continued Medical Work up   Patient Goals and CMS Choice Patient states their goals for this hospitalization and ongoing recovery are:: to go home CMS Medicare.gov Compare Post Acute Care list provided to:: Patient Choice offered to / list presented to : Patient   Discharge Plan and Services In-house Referral: Clinical Social Work Discharge Planning Services: CM Consult Post Acute Care Choice: Home Health          DME Arranged: N/A         HH Arranged: RN [place]: [facility] Date [place]: 09/12/18 Time [place]: 1209 Representative spoke with at [place]: Joen Laura will need orders and face to face  Social Determinants of Health (SDOH) Interventions     Readmission Risk Interventions Readmission Risk Prevention Plan 05/09/2018  Transportation Screening Complete  PCP or Specialist Appt within 3-5 Days Complete  HRI or [place] Complete  Social Work Consult for [place] Planning/Counseling Complete  Palliative Care Screening Not Applicable  Medication Review Press photographer) Complete  Some recent data might be hidden

## 2018-09-14 NOTE — Progress Notes (Signed)
Peripherally Inserted Central Catheter/Midline Placement  The IV Nurse has discussed with the patient and/or persons authorized to consent for the patient, the purpose of this procedure and the potential benefits and risks involved with this procedure.  The benefits include less needle sticks, lab draws from the catheter, and the patient may be discharged home with the catheter. Risks include, but not limited to, infection, bleeding, blood clot (thrombus formation), and puncture of an artery; nerve damage and irregular heartbeat and possibility to perform a PICC exchange if needed/ordered by physician.  Alternatives to this procedure were also discussed.  Bard Power PICC patient education guide, fact sheet on infection prevention and patient information card has been provided to patient /or left at bedside.    PICC/Midline Placement Documentation  PICC Single Lumen 05/14/18 PICC Right Brachial 39 cm 0 cm (Active)     PICC Single Lumen 09/14/18 PICC Right Brachial 40 cm 0 cm (Active)  Indication for Insertion or Continuance of Line Home intravenous therapies (PICC only) 09/14/18 0812  Exposed Catheter (cm) 0 cm 09/14/18 6333  Site Assessment Clean;Dry;Intact 09/14/18 0812  Line Status Flushed;Blood return noted 09/14/18 5456  Dressing Type Transparent 09/14/18 0812  Dressing Status Clean;Dry;Intact;Antimicrobial disc in place 09/14/18 0812  Dressing Intervention New dressing 09/14/18 0812  Dressing Change Due 09/21/18 09/14/18 2563       Christella Noa Albarece 09/14/2018, 8:12 AM

## 2018-09-15 ENCOUNTER — Telehealth: Payer: Self-pay | Admitting: *Deleted

## 2018-09-15 NOTE — Telephone Encounter (Signed)
Marcene Brawn the RN doing PICC care called to report that the patient is to get IV dosing 3 x daily but he only has help for 2 x daily dosing. His in home nurse is not available for the evening dose and he has no one else who is available to help him and he can not do it himself. She wants the provider to know to see what we can do the change dosing or change the medication. Advised her will let [facility] know and give her a call back.

## 2018-09-15 NOTE — Telephone Encounter (Signed)
While this is a bit difficult.  I do not really want to put him on vancomycin but that would be an option infusions will be longer and risk of nephrotoxicity.  Daptomycin would be a daily drug to be effective but would be very expensive.  Primitivo Gauze have any other ideas I imagine we could also put him on oral therapy but given all he is gone through I feel he deserves a good course of systemic IV antibiotics

## 2018-09-16 LAB — GLUCOSE, CAPILLARY
Glucose-Capillary: 99 mg/dL (ref 70–99)
Glucose-Capillary: 244 mg/dL — ABNORMAL HIGH (ref 70–99)
Glucose-Capillary: 185 mg/dL — ABNORMAL HIGH (ref 70–99)
Glucose-Capillary: 204 mg/dL — ABNORMAL HIGH (ref 70–99)
Glucose-Capillary: 122 mg/dL — ABNORMAL HIGH (ref 70–99)
Glucose-Capillary: 156 mg/dL — ABNORMAL HIGH (ref 70–99)
Glucose-Capillary: 296 mg/dL — ABNORMAL HIGH (ref 70–99)
Glucose-Capillary: 224 mg/dL — ABNORMAL HIGH (ref 70–99)
Glucose-Capillary: 303 mg/dL — ABNORMAL HIGH (ref 70–99)

## 2018-09-16 LAB — CULTURE, BLOOD (ROUTINE X 2)
Culture: NO GROWTH
Culture: NO GROWTH
Special Requests: ADEQUATE

## 2018-09-16 NOTE — Telephone Encounter (Signed)
[facility] called back to advise they need dosing for the Dapto as they do not have protocols for this medication.  Per Cassie Kuppelweiser Pharm D given dose of 560 mg daily of Daptomycin.  [facility] will call with any problems. In Home nurse is aware as well as patient.

## 2018-09-16 NOTE — Telephone Encounter (Signed)
They can try to start him on daptomycin per pharmacy once daily but I doubt his insurance will cover it it

## 2018-09-16 NOTE — Telephone Encounter (Signed)
Per Tommy Medal recommendation called the patient nurse Marcene Brawn and [facility] to advise of the change in IV therapy. Daptomycin per pharmacy protocol. [facility] advised will run insurance and see if patient can afford the medication and give [place] a call back.

## 2018-09-16 NOTE — Telephone Encounter (Signed)
Nurse calling back please advise what to do for antibiotic.

## 2018-09-17 NOTE — Telephone Encounter (Signed)
Thanks so much appreciated

## 2018-09-18 LAB — AEROBIC/ANAEROBIC CULTURE W GRAM STAIN (SURGICAL/DEEP WOUND)
Culture: NO GROWTH
Special Requests: NORMAL

## 2018-09-20 NOTE — Discharge Summary (Signed)
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 Please follow up with ID and PCP as recommended    Follow-up Information    [name], MD. Schedule an appointment as soon as possible for a visit in 1 week(s).   Specialty: Internal Medicine Contact information: [address] [place] [address] [phone]        Tommy Medal, Lavell Islam, MD. Schedule an appointment as soon as possible for a visit in 2 week(s).   Specialty: Infectious Diseases Contact information: [address] [place] [address] [phone]          Diet recommendation: CARDIAC DIET  Activity: The patient is advised to gradually reintroduce usual activities,as tolerated .  Discharge Condition: good  Code Status: FULL CODE  History of present illness: As per the H and P dictated on admission, "Anthony Dunlap  is a 86 y.o. male, history of chronic urinary retention with chronic indwelling Foley catheter in the setting of prior history of prostate cancer, diabetes mellitus, hyperlipidemia, with history of polymicrobial infection with pelvic osteomyelitis of the pubic symphysis and abscess in the abductor sheath and left thigh abscess, history of candidemia in the past, who finished prolonged IV antibiotic course through PICC line , patient presents to ID clinic secondary to worsening right hip pain, last June, he  had MRI pelvis obtained, which did show new pubic symphysis abscess, so ID requested direct admission for culture and IV antibiotic initiation , the patient has been having worsening right hip pain, he is less ambulatory, but he denies fever, chills, chest pain, shortness of breath, patient was accepted as direct admission."  Hospital Course:  Summary of his active problems in the hospital is as following. Pubic symphysis abscess, known history of pelvic osteomyelitis MRI done 7/17 showed small area of abscess in the pubic symphysis now with significant elevated inflammatory markers CRP at 45, new MRI of the pelvis shows abscess.   IR did CT-guided aspiration for cultures.  ID following.   After CT aspiration placed PICC line and started on antibiotics per ID recommendations  Diabetes mellitus type 2 stable Continue long-acting insulin and home regimen  Chronic anemia due to chronic osteomyelitis  Stable,  Hypothyroidism Stable continue current medications  Hyperlipidemia   Stable continue home meds.  Patient was ambulatory without any assistance. On the day of the discharge the patient's vitals were stable, and no other acute medical condition were reported by patient. the patient was felt safe to be discharge at Home with Home health.  Consultants: ID Procedures: PICC Line placement  DISCHARGE MEDICATION: Allergies as of 09/14/2018      Reactions   Fluconazole Other (See Comments)   Drug-induced hepatitis      Medication List    TAKE these medications   acetaminophen 325 MG tablet Commonly known as: TYLENOL Take 2 tablets (650 mg total) by mouth every 6 (six) hours as needed for mild  pain, fever or headache.   ampicillin-sulbactam  IVPB Commonly known as: UNASYN Inject 3 g into the vein every 8 (eight) hours. Indication:  osteomyelitis Last Day of Therapy:  11/07/2018 Labs - Once weekly:  CBC/D and BMP, Labs - Every other week:  ESR and CRP   aspirin EC 81 MG  tablet Take 81 mg by mouth daily.   ferrous sulfate 325 (65 FE) MG tablet Take 1 tablet (325 mg total) by mouth daily with breakfast.   furosemide 20 MG tablet Commonly known as: LASIX Take 20 mg by mouth daily as needed for fluid.   glimepiride 4 MG tablet Commonly known as: AMARYL Take 4 mg by mouth daily.   insulin aspart 100 UNIT/ML injection Commonly known as: novoLOG Inject 0-15 Units into the skin 3 (three) times daily with meals. Sliding scale insulin Less than 70 initiate hypoglycemia protocol 70-120  0 units 120-150 2 unit 151-200 3 units 201-250 3 units 251-300 5 units 301-350 8 units 351-400 11 units  Greater than 400 15 units , call MD   insulin detemir 100 UNIT/ML injection Commonly known as: LEVEMIR Inject 0.06 mLs (6 Units total) into the skin daily.   levothyroxine 100 MCG tablet Commonly known as: SYNTHROID Take 100 mcg by mouth daily.   magnesium oxide 400 (241.3 Mg) MG tablet Commonly known as: MAG-OX Take 1 tablet (400 mg total) by mouth daily.   meloxicam 7.5 MG tablet Commonly known as: MOBIC Take 7.5 mg by mouth 2 (two) times daily.   multivitamin with minerals Tabs tablet Take 1 tablet by mouth daily.   nutrition supplement (JUVEN) Pack Take 1 packet by mouth 2 (two) times daily between meals.   nystatin powder Commonly known as: MYCOSTATIN/NYSTOP Apply topically 4 (four) times daily.   pravastatin 10 MG tablet Commonly known as: PRAVACHOL Take 10 mg by mouth daily.   [place] 100-33 UNT-MCG/ML Sopn Generic drug: Insulin Glargine-Lixisenatide Inject 45 Units as directed daily.            Home Infusion Instuctions  (From admission, onward)         Start     Ordered   09/14/18 0000  Home infusion instructions [facility] May follow [place] Dosing Protocol; May administer Cathflo as needed to maintain patency of vascular access device.; Flushing of vascular access device: per [facility] Protocol: 0.9% NaCl pre/post  medica...    Question Answer Comment  Instructions May follow [place] Dosing Protocol   Instructions May administer Cathflo as needed to maintain patency of vascular access device.   Instructions Flushing of vascular access device: per [facility] Protocol: 0.9% NaCl pre/post medication administration and prn patency; Heparin 100 u/ml, 38m for implanted ports and Heparin 10u/ml, 551mfor all other central venous catheters.   Instructions May follow [facility] Anaphylaxis Protocol for First Dose Administration in the home: 0.9% NaCl at 25-50 ml/hr to maintain IV access for protocol meds. Epinephrine 0.3 ml IV/IM PRN and Benadryl 25-50 IV/IM PRN s/s of anaphylaxis.   Instructions [facility] Infusion Coordinator (RN) to assist per patient IV care needs in the home PRN.      09/14/18 1222         Allergies  Allergen Reactions  . Fluconazole Other (See Comments)    Drug-induced hepatitis   Discharge Instructions    Diet - low sodium heart healthy   Complete by: As directed    Discharge instructions   Complete by: As directed    It is  important that you read the given instructions as well as go over your medication list with RN to help you understand your care after this hospitalization.  Discharge Instructions: Please follow-up with PCP in 1-2 weeks  Please request your primary care physician to go over all Hospital Tests and Procedure/Radiological results at the follow up. Please get all Hospital records sent to your PCP by signing hospital release before you go home.   Do not take more than prescribed Pain, Sleep and Anxiety Medications. You were cared for by a hospitalist during your hospital stay. If you have any questions about your discharge medications or the care you received while you were in the hospital after you are discharged, you can call the unit '@UNIT'$ @ you were admitted to and ask to speak with the hospitalist on call if the hospitalist that took care of you is not available.   Once you are discharged, your primary care physician will handle any further medical issues. Please note that NO REFILLS for any discharge medications will be authorized once you are discharged, as it is imperative that you return to your primary care physician (or establish a relationship with a primary care physician if you do not have one) for your aftercare needs so that they can reassess your need for medications and monitor your lab values. You Must read complete instructions/literature along with all the possible adverse reactions/side effects for all the Medicines you take and that have been prescribed to you. Take any new Medicines after you have completely understood and accept all the possible adverse reactions/side effects. Wear Seat belts while driving. If you have smoked or chewed Tobacco in the last 2 yrs please stop smoking and/or stop any Recreational drug use.  If you drink alcohol, please moderate the use and do not drive, operating heavy machinery, perform activities at heights, swimming or participation in water activities or provide baby sitting services under influence.   Home infusion instructions [facility] May follow [place] Dosing Protocol; May administer Cathflo as needed to maintain patency of vascular access device.; [place]ushing of vascular access device: per [facility] Protocol: 0.9% NaCl pre/post medica...   Complete by: As directed    Instructions: May follow [place] Dosing Protocol   Instructions: May administer Cathflo as needed to maintain patency of vascular access device.   Instructions: Flushing of vascular access device: per [facility] Protocol: 0.9% NaCl pre/post medication administration and prn patency; Heparin 100 u/ml, 34m for implanted ports and Heparin 10u/ml, 564mfor all other central venous catheters.   Instructions: May follow [facility] Anaphylaxis Protocol for First Dose Administration in the home: 0.9% NaCl at 25-50 ml/hr to maintain IV access for protocol  meds. Epinephrine 0.3 ml IV/IM PRN and Benadryl 25-50 IV/IM PRN s/s of anaphylaxis.   Instructions: [facility] Coord[facility] (RN) to assist per patient IV care needs in the home PRN.   Increase activity slowly   Complete by: As directed      Discharge Exam: There were no vitals filed for this visit. Vitals:   09/13/18 2157 09/14/18 0509  BP: (!) 170/97 (!) 151/89  Pulse: (!) 107 85  Resp: 17 16  Temp: 98.1 F (36.7 C) 98.1 F (36.7 C)  SpO2: 100% 98%   General: Appear in no distress, no Rash; Oral Mucosa Clear, moist. no Abnormal Mass Or lumps Cardiovascular: S1 and S2 Present, no Murmur, Respiratory: normal respiratory effort, Bilateral Air entry present and Clear to Auscultation, no Crackles, no wheezes Abdomen:  Bowel Sound present, Soft and no tenderness, no hernia Extremities: no Pedal edema, no calf tenderness Neurology: alert and oriented to time, place, and person affect appropriate. normal without focal findings, mental status, speech normal, alert and oriented x3, PERLA, Motor strength 5/5 and symmetric and sensation grossly normal to light touch   The results of significant diagnostics from this hospitalization (including imaging, microbiology, ancillary and laboratory) are listed below for reference.    Significant Diagnostic Studies: Ct Pelvis W Contrast  Result Date: 09/12/2018 CLINICAL DATA:  Osteomyelitis suspected EXAM: CT PELVIS WITH CONTRAST TECHNIQUE: Multidetector CT imaging of the pelvis was performed using the standard protocol following the bolus administration of intravenous contrast. CONTRAST:  125m ISOVUE-300 IOPAMIDOL (ISOVUE-300) INJECTION 61% COMPARISON:  MRI 09/05/2018 FINDINGS: Urinary Tract: Foley catheter present in the bladder which is decompressed. Ureters are decompressed. Small cysts in the midpole of both kidneys. No hydronephrosis. Bowel: Moderate stool burden throughout the colon. Normal appendix. Visualized small bowel  decompressed, grossly unremarkable. Vascular/Lymphatic: Aortoiliac atherosclerosis. No aneurysm or adenopathy. Reproductive:  Radiation seeds in the region of the prostate. Other:  No free fluid or free air. Musculoskeletal: Changes of osteomyelitis noted about the pubic symphysis, extending into the superior and inferior pubic rami. There is widening of the pubic symphysis with soft tissue extending anteriorly. This could reflect septic arthritis or abscess. IMPRESSION: Again noted are changes of osteomyelitis within the pubic bones, adjacent inferior and superior pubic rami bilaterally, stable since recent MRI. Widening and irregularity of the pubic symphysis with soft tissue noted anteriorly. This could be related to septic arthritis or abscess. Again, findings similar to prior MRI. Aortoiliac atherosclerosis. Moderate stool burden throughout the colon. Electronically Signed   By: KRolm BaptiseM.D.   On: 09/12/2018 09:50   Mr Pelvis W WWIContrast  Result Date: 09/05/2018 CLINICAL DATA:  Right hip and leg pain. History of prostate cancer. History of osteomyelitis about the symphysis pubis. EXAM: MRI PELVIS WITHOUT AND WITH CONTRAST TECHNIQUE: Multiplanar, multisequence MR imaging of the pelvis was performed both before and after administration of intravenous contrast. CONTRAST:  14 mL MULTIHANCE GADOBENATE DIMEGLUMINE 529 MG/ML IV SOLN COMPARISON:  MRI of the pelvis 03/28/2018. FINDINGS: Bones/Joint/Cartilage Edema and enhancement about the symphysis pubis and pubic rami persist but have improved, particularly about the symphysis pubis and in the superior pubic rami. There has been only mild improvement in the appearance of the inferior pubic rami. No new focus of marrow signal abnormality is identified. There is a small fluid collection at the symphysis pubis measuring 1.1 cm AP by 0.6 cm transverse by 0.9 cm craniocaudal which is new since the prior MRI. Severe right and moderate to moderately severe left  hip osteoarthritis appears unchanged. No hip joint effusion. SI joints unremarkable. Ligaments Intact. Muscles and Tendons No intramuscular abscess.  No tear or strain. Soft tissues Foley catheter remains in place.  No new abnormality in the pelvis. IMPRESSION: Osteomyelitis about the symphysis pubis demonstrates some improvement, particularly in the superior pubic rami. There has been only minimal improvement in signal abnormality in the inferior pubic rami. Tiny fluid collection in the symphysis pubis is new since the prior examination worrisome for abscess. No change in severe right and moderately severe left hip osteoarthritis. Electronically Signed   By: TInge RiseM.D.   On: 09/05/2018 14:56   Ct Aspiration  Result Date: 09/13/2018 INDICATION: History osteomyelitis about the pubic symphysis. Please perform CT-guided aspiration for diagnostic purposes. EXAM: CT GUIDANCE NEEDLE PLACEMENT COMPARISON:  Pelvic CT-09/12/2018; pelvic MRI-09/05/2018 MEDICATIONS: The patient is currently admitted to the hospital and receiving intravenous antibiotics. The antibiotics were administered within an appropriate time frame prior to the initiation of the procedure. ANESTHESIA/SEDATION: None CONTRAST:  None COMPLICATIONS: None immediate. PROCEDURE: Informed written consent was obtained from the patient after a discussion of the risks, benefits and alternatives to treatment. The patient was placed supine on the CT gantry and a pre procedural CT was performed re-demonstrating lytic destruction about the pubic symphysis compatible with osteomyelitis. The procedure was planned. A timeout was performed prior to the initiation of the procedure. The skin overlying the ventral aspect of the pubis was prepped and draped in the usual sterile fashion. The overlying soft tissues were anesthetized with 1% lidocaine with epinephrine. Appropriate trajectory was planned with the use of a 22 gauge spinal needle. An 18 gauge trocar  needle was advanced into the pubic symphysis. Appropriate positioning was confirmed with a limited postprocedural CT scan. No significant fluid was able to be aspirated from the pubic symphysis therefore approximately 2 cc of saline was administered and subsequently aspirated. All aspirated fluid was combined with 10 cc of saline, capped and submitted to the laboratory for analysis. A dressing was placed. The patient tolerated the procedure well without immediate post procedural complication. IMPRESSION: Technically successful CT-guided aspiration of the pubic symphysis. No significant fluid was able to be aspirated from the pubic symphysis therefore 2 cc of saline was administered and subsequently aspirated, capped and submitted to the laboratory for analysis. Electronically Signed   By: Sandi Mariscal M.D.   On: 09/13/2018 13:39   [place] Ekg Site Rite  Result Date: 09/13/2018 If Site Rite image not attached, placement could not be confirmed due to current cardiac rhythm.   Microbiology: Recent Results (from the past 240 hour(s))  SARS Coronavirus 2 (CEPHEID - Performed in [place] hospital lab), Hosp Order     Status: None   Collection Time: 09/11/18  2:10 PM   Specimen: Nasopharyngeal Swab  Result Value Ref Range Status   SARS Coronavirus 2 NEGATIVE NEGATIVE Final    Comment: (NOTE) If result is NEGATIVE SARS-CoV-2 target nucleic acids are NOT DETECTED. The SARS-CoV-2 RNA is generally detectable in upper and lower  respiratory specimens during the acute phase of infection. The lowest  concentration of SARS-CoV-2 viral copies this assay can detect is 250  copies / mL. A negative result does not preclude SARS-CoV-2 infection  and should not be used as the sole basis for treatment or other  patient management decisions.  A negative result may occur with  improper specimen collection / handling, submission of specimen other  than nasopharyngeal swab, presence of viral mutation(s) within the   areas targeted by this assay, and inadequate number of viral copies  (<250 copies / mL). A negative result must be combined with clinical  observations, patient history, and epidemiological information. If result is POSITIVE SARS-CoV-2 target nucleic acids are DETECTED. The SARS-CoV-2 RNA is generally detectable in upper and lower  respiratory specimens dur ing the acute phase of infection.  Positive  results are indicative of active infection with SARS-CoV-2.  Clinical  correlation with patient history and other diagnostic information is  necessary to determine patient infection status.  Positive results do  not rule out bacterial infection or co-infection with other viruses. If result is PRESUMPTIVE POSTIVE SARS-CoV-2 nucleic acids MAY BE PRESENT.   A presumptive positive result was obtained on the submitted specimen  and confirmed on repeat  testing.  While 2019 novel coronavirus  (SARS-CoV-2) nucleic acids may be present in the submitted sample  additional confirmatory testing may be necessary for epidemiological  and / or clinical management purposes  to differentiate between  SARS-CoV-2 and other Sarbecovirus currently known to infect humans.  If clinically indicated additional testing with an alternate test  methodology [phone]) is advised. The SARS-CoV-2 RNA is generally  detectable in upper and lower respiratory sp ecimens during the acute  phase of infection. The expected result is Negative. Fact Sheet for Patients:  StrictlyIdeas.no Fact Sheet for Healthcare Providers: BankingDealers.co.za This test is not yet approved or cleared by the [place] FDA and has been authorized for detection and/or diagnosis of SARS-CoV-2 by FDA under an Emergency Use Authorization (EUA).  This EUA will remain in effect (meaning this test can be used) for the duration of the COVID-19 declaration under Section 564(b)(1) of the Act, 21  U.S.C. section 360bbb-3(b)(1), unless the authorization is terminated or revoked sooner. Performed at [facility], [place] [address], [place], [place] [address]   Culture, blood (routine x 2)     Status: None   Collection Time: 09/11/18  2:30 PM   Specimen: BLOOD  Result Value Ref Range Status   Specimen Description BLOOD RIGHT ANTECUBITAL  Final   Special Requests   Final    BOTTLES DRAWN AEROBIC AND ANAEROBIC Blood Culture adequate volume   Culture   Final    NO GROWTH 5 DAYS Performed at [facility], [place] [address], [place], [place] [address]    Report Status 09/16/2018 FINAL  Final  Culture, blood (routine x 2)     Status: None   Collection Time: 09/11/18  2:31 PM   Specimen: BLOOD RIGHT FOREARM  Result Value Ref Range Status   Specimen Description BLOOD RIGHT FOREARM  Final   Special Requests   Final    BOTTLES DRAWN AEROBIC AND ANAEROBIC Blood Culture results may not be optimal due to an excessive volume of blood received in culture bottles   Culture   Final    NO GROWTH 5 DAYS Performed at [facility], [place] [address], [place], [place] [address]    Report Status 09/16/2018 FINAL  Final  Aerobic/Anaerobic Culture (surgical/deep wound)     Status: None   Collection Time: 09/13/18  1:04 PM   Specimen: Abscess  Result Value Ref Range Status   Specimen Description ABSCESS PUBIC SYMPHYSIS  Final   Special Requests Normal  Final   Gram Stain   Final    RARE WBC PRESENT, PREDOMINANTLY PMN NO ORGANISMS SEEN    Culture   Final    No growth aerobically or anaerobically. Performed at [facility], [place] [address], [place], [place] [address]    Report Status 09/18/2018 FINAL  Final     Labs: CBC: Recent Labs  Lab 09/14/18 0251  WBC 7.8  NEUTROABS 4.8  HGB 9.8*  HCT 31.2*  MCV 84.6  PLT 086*   Basic Metabolic Panel: Recent Labs  Lab 09/14/18 0251  NA 137  K 4.4  CL 106  CO2 23  GLUCOSE 280*  BUN 26*  CREATININE 1.14  CALCIUM 8.9    Liver Function Tests: No results for input(s): AST, ALT, ALKPHOS, BILITOT, PROT, ALBUMIN in the last 168 hours. No results for input(s): LIPASE, AMYLASE in the last 168 hours. No results for input(s): AMMONIA in the last 168 hours. Cardiac Enzymes: No results for input(s): CKTOTAL,  CKMB, CKMBINDEX, TROPONINI in the last 168 hours. BNP (last 3 results) No results for input(s): BNP in the last 8760 hours. CBG: Recent Labs  Lab 09/13/18 1633 09/13/18 1730 09/13/18 2200 09/14/18 0825 09/14/18 1151  GLUCAP 122* 156* 296* 224* 303*   Time spent: 35 minutes  Signed:  Berle Mull  [facility] 09/14/2018

## 2018-10-03 ENCOUNTER — Encounter: Payer: Self-pay | Admitting: Infectious Disease

## 2018-10-06 ENCOUNTER — Telehealth: Payer: Self-pay | Admitting: Infectious Disease

## 2018-10-06 ENCOUNTER — Other Ambulatory Visit: Payer: Self-pay

## 2018-10-06 ENCOUNTER — Encounter: Payer: Self-pay | Admitting: Infectious Disease

## 2018-10-06 ENCOUNTER — Ambulatory Visit: Payer: Medicare Other | Admitting: Infectious Disease

## 2018-10-06 VITALS — BP 150/81 | HR 109 | Temp 97.3°F

## 2018-10-06 DIAGNOSIS — B952 Enterococcus as the cause of diseases classified elsewhere: Secondary | ICD-10-CM | POA: Diagnosis not present

## 2018-10-06 DIAGNOSIS — A414 Sepsis due to anaerobes: Secondary | ICD-10-CM

## 2018-10-06 DIAGNOSIS — M86652 Other chronic osteomyelitis, left thigh: Secondary | ICD-10-CM

## 2018-10-06 MED ORDER — NYSTATIN 100000 UNIT/GM EX POWD: Freq: Four times a day (QID) | CUTANEOUS | 8 refills | Status: DC

## 2018-10-06 MED ORDER — METRONIDAZOLE 500 MG PO TABS: 500.0000 mg | ORAL_TABLET | Freq: Three times a day (TID) | ORAL | 2 refills | Status: AC

## 2018-10-06 MED ORDER — SODIUM CHLORIDE 0.9 % IV SOLN: 2.0000 g | INTRAVENOUS | Status: DC

## 2018-10-06 NOTE — Progress Notes (Signed)
Subjective:    Patient ID: Anthony Dunlap, male    DOB: 02-Apr-1933, 85 y.o.   MRN: [id number]  Chief Complaint:  Right hip pain, which persists despite having on antibiotics for several weeks   HPI  85 yomalewith polymicrobial infection with pelvic osteomyelitis of the pubic symphysis and abscess in the adductor sheath and left thigh status post IR placed drains  He had grown enterococcus faecalis that was sensitive to ampicillin and a fairly sensitive Klebsiella pneumoniae anda  bacteroides  Had planned on giving him IV Unasyn for 8 weeks with stop date being March 25, 2018. He is currently residing in a skilled nursing facility and on Zosyn rather than Unasyn. I do not know why this change was made as his microbes were covered by the Unasyn. In any case the abscesses have resolved radiographically and the drains been pulled by interventional radiology.  He did well since I last saw him in March 2020, but then was admitted to the hospital with severe candidal infection with intertrigo, and diarrhea with the catheter in place and candidemia.  He was treated with fluconazole but then developed hepatitis and was changed over to Eraxis and completed a two-week course.  He was not formally seen by an ophthalmologist in the hospital nor afterwards in the clinic.  He deniedhaving any visual changes other than occasionally having itching eyes.  He continues sheltering in place at his home with his wife, son( who also got on the phone call with dad) and only being visited by 2 other people who he employs to help take care of himself and his wife.  I had last physically seen him saw him he was doing better and was able to walk around without much hip pain  However when I conducted a telephone visit with him on June 15 of 2020 he was experiencing worsening right-sided hip pain with ambulation and with times different positions in his bed.  He tells me at the last visit he  mentioned painthat he associates this with having undergone some physical therapy with some external and internal rotation of his hip joint. I saw him in person in late June and his inflammatory markers were elevated I then ordered an MRI on July 16th showed:  IMPRESSION: Osteomyelitis about the symphysis pubis demonstrates some improvement, particularly in the superior pubic rami. There has been only minimal improvement in signal abnormality in the inferior pubic rami.  Tiny fluid collection in the symphysis pubis is new since the prior examination worrisome for abscess.  He had no change in severe right and moderately severe left hip osteoarthritis.  I I arrange for him to be admitted to the hospital directly from clinic last visit.  Interventional radiology able to aspirate the pubic symphysis with 1 mL of bloody fluid being recovered for culture but no organisms grew.  He is placed back on intravenous Unasyn.  Unfortunately taking the Unasyn at home became too burdensome for the son because of it being multiple times per day.  He has been switched over to IV daptomycin for over the last week or 2 though his klebsiella PNA is not being properly covered if it is a Youth worker  Unfortunately his pain is not improved I do not have access to his most recent inflammatory markers though his sed rate was elevated at 97 when checked in early August.    Past Medical History:  Diagnosis Date  . 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  Diabetes ([place])   . Drug-induced hepatitis 06/05/2018  . Left eye pain   . Prostate cancer [facility])     Past Surgical History:  Procedure Laterality Date  . HERNIA REPAIR    . IR RADIOLOGIST EVAL & MGMT  03/06/2018  . PROSTATE SURGERY      Family History  Problem Relation Age of Onset  . Pneumonia Father   . Cancer Sister       Social History   Socioeconomic History  . Marital status: Married    Spouse name:  [name]  . Number of children: 3  . Years of education: 9th  . Highest education level: Not on file  Occupational History    Employer: RETIRED    Comment: Retired  Scientific laboratory technician  . Financial resource strain: Not on file  . Food insecurity    Worry: Not on file    Inability: Not on file  . Transportation needs    Medical: Not on file    Non-medical: Not on file  Tobacco Use  . Smoking status: Never Smoker  . Smokeless tobacco: Never Used  Substance and Sexual Activity  . Alcohol use: No  . Drug use: No  . Sexual activity: Not on file  Lifestyle  . Physical activity    Days per week: Not on file    Minutes per session: Not on file  . Stress: Not on file  Relationships  . Social Herbalist on phone: Not on file    Gets together: Not on file    Attends religious service: Not on file    Active member of club or organization: Not on file    Attends meetings of clubs or organizations: Not on file    Relationship status: Not on file  Other Topics Concern  . Not on file  Social History Narrative   Patient lives at home with his wife. [name]) . Patient is retired.   Education 9th grade.   Right handed.   Caffeine None    Allergies  Allergen Reactions  . Fluconazole Other (See Comments)    Drug-induced hepatitis     Current Outpatient Medications:  .  acetaminophen (TYLENOL) 325 MG tablet, Take 2 tablets (650 mg total) by mouth every 6 (six) hours as needed for mild pain, fever or headache., Disp: , Rfl:  .  ampicillin-sulbactam (UNASYN) IVPB, Inject 3 g into the vein every 8 (eight) hours. Indication:  osteomyelitis Last Day of Therapy:  11/07/2018 Labs - Once weekly:  CBC/D and BMP, Labs - Every other week:  ESR and CRP, Disp: 162 Units, Rfl: 0 .  aspirin EC 81 MG tablet, Take 81 mg by mouth daily., Disp: , Rfl:  .  ferrous sulfate 325 (65 FE) MG tablet, Take 1 tablet (325 mg total) by mouth daily with breakfast. (Patient not taking: Reported on 09/11/2018),  Disp: , Rfl: 3 .  furosemide (LASIX) 20 MG tablet, Take 20 mg by mouth daily as needed for fluid., Disp: , Rfl:  .  glimepiride (AMARYL) 4 MG tablet, Take 4 mg by mouth daily. , Disp: , Rfl:  .  insulin aspart (NOVOLOG) 100 UNIT/ML injection, Inject 0-15 Units into the skin 3 (three) times daily with meals. Sliding scale insulin Less than 70 initiate hypoglycemia protocol 70-120  0 units 120-150 2 unit 151-200 3 units 201-250 3 units 251-300 5 units 301-350 8 units 351-400 11 units  Greater than 400 15 units , call MD (Patient not taking:  Reported on 09/11/2018), Disp: 10 mL, Rfl: 11 .  insulin detemir (LEVEMIR) 100 UNIT/ML injection, Inject 0.06 mLs (6 Units total) into the skin daily. (Patient not taking: Reported on 09/11/2018), Disp: 10 mL, Rfl: 11 .  levothyroxine (SYNTHROID, LEVOTHROID) 100 MCG tablet, Take 100 mcg by mouth daily. , Disp: , Rfl:  .  magnesium oxide (MAG-OX) 400 (241.3 Mg) MG tablet, Take 1 tablet (400 mg total) by mouth daily. (Patient not taking: Reported on 09/11/2018), Disp: , Rfl:  .  meloxicam (MOBIC) 7.5 MG tablet, Take 7.5 mg by mouth 2 (two) times daily. , Disp: , Rfl:  .  Multiple Vitamin (MULTIVITAMIN WITH MINERALS) TABS tablet, Take 1 tablet by mouth daily., Disp: , Rfl:  .  nutrition supplement, JUVEN, (JUVEN) PACK, Take 1 packet by mouth 2 (two) times daily between meals. (Patient not taking: Reported on 09/11/2018), Disp: , Rfl: 0 .  nystatin (MYCOSTATIN/NYSTOP) powder, Apply topically 4 (four) times daily., Disp: , Rfl:  .  pravastatin (PRAVACHOL) 10 MG tablet, Take 10 mg by mouth daily., Disp: , Rfl:  .  SOLIQUA 100-33 UNT-MCG/ML SOPN, Inject 45 Units as directed daily. , Disp: , Rfl:    Review of Systems  Constitutional: Negative for activity change, appetite change, chills, diaphoresis, fatigue, fever and unexpected weight change.  HENT: Negative for congestion, rhinorrhea, sinus pressure, sneezing, sore throat and trouble swallowing.   Eyes: Negative for  photophobia and visual disturbance.  Respiratory: Negative for cough, chest tightness, shortness of breath, wheezing and stridor.   Cardiovascular: Negative for chest pain, palpitations and leg swelling.  Gastrointestinal: Negative for abdominal distention, abdominal pain, anal bleeding, blood in stool, constipation, diarrhea, nausea and vomiting.  Genitourinary: Negative for difficulty urinating, dysuria, flank pain and hematuria.  Musculoskeletal: Positive for arthralgias, gait problem and myalgias. Negative for back pain and joint swelling.  Skin: Negative for color change, pallor, rash and wound.  Neurological: Negative for dizziness, tremors, weakness and light-headedness.  Hematological: Negative for adenopathy. Does not bruise/bleed easily.  Psychiatric/Behavioral: Negative for agitation, behavioral problems, confusion, decreased concentration, dysphoric mood and sleep disturbance.       Objective:   Physical Exam Constitutional:      General: He is not in acute distress.    Appearance: Normal appearance. He is well-developed. He is not ill-appearing or diaphoretic.  HENT:     Head: Normocephalic and atraumatic.     Right Ear: Hearing and external ear normal.     Left Ear: Hearing and external ear normal.     Nose: No nasal deformity or rhinorrhea.  Eyes:     General: No scleral icterus.    Conjunctiva/sclera: Conjunctivae normal.     Right eye: Right conjunctiva is not injected.     Left eye: Left conjunctiva is not injected.  Neck:     Musculoskeletal: Normal range of motion and neck supple.     Vascular: No JVD.  Cardiovascular:     Rate and Rhythm: Regular rhythm. Tachycardia present.     Heart sounds: S1 normal and S2 normal.  Abdominal:     General: There is no distension.     Palpations: Abdomen is soft.  Musculoskeletal:     Right shoulder: Normal.     Left shoulder: Normal.     Right hip: He exhibits decreased range of motion.     Left hip: He exhibits  decreased range of motion.     Right knee: Normal.     Left knee: Normal.  Lymphadenopathy:  Head:     Right side of head: No submandibular, preauricular or posterior auricular adenopathy.     Left side of head: No submandibular, preauricular or posterior auricular adenopathy.     Cervical: No cervical adenopathy.     Right cervical: No superficial or deep cervical adenopathy.    Left cervical: No superficial or deep cervical adenopathy.  Skin:    General: Skin is warm and dry.     Coloration: Skin is not pale.     Findings: No abrasion, bruising, ecchymosis, erythema, lesion or rash.     Nails: There is no clubbing.   Neurological:     General: No focal deficit present.     Mental Status: He is alert and oriented to person, place, and time.     Sensory: No sensory deficit.     Coordination: Coordination normal.     Gait: Gait normal.  Psychiatric:        Attention and Perception: He is attentive.        Mood and Affect: Mood normal.        Speech: Speech normal.        Behavior: Behavior normal. Behavior is cooperative.        Thought Content: Thought content normal.        Judgment: Judgment normal.    PICC line is clean dry and intact       Assessment & Plan:   Polymicrobial infection with pelvic osteomyelitis of the pubic symphysis and abscess in the adductor sheath and left thigh abscess which had seemed to be resolving clinically though he had some significant findings on imaging   course of 8 weeks of therapy.  He has been restarted on systemic antibiotic therapy with IV Unasyn but this was not convenient for home so he has been on daptomycin.  As mentioned there was no organisms grown on culture off antibiotics but we are failing to provide gram-negative coverage with daptomycin so I will add ceftriaxone along with flagyl orally  I will reevaluate him prior to stopping his antimicrobial therapy.  I like to have access to his inflammatory marker data as well.     History of serious yeast infection: I will repeat new his prescription of topical antifungal and he is to not be on an azole since it caused hepatitis.

## 2018-10-06 NOTE — Telephone Encounter (Signed)
I added oral flagyl for him to be taking TID in addition to the rocephin I am adding to his daptomycin IV through [facility]  I have also called in his topical antifungal  Can we make sure he DOES also start flagyl and avoids alcohol completely while on it?

## 2018-10-07 ENCOUNTER — Telehealth: Payer: Self-pay | Admitting: *Deleted

## 2018-10-07 NOTE — Telephone Encounter (Signed)
Mary at [facility] called to ask if the patient has had Rocephine or any antibiotic in that class before. It was added to his IV medications and nursing needs to know prior to sending it out. Advised will send message to provider and pharmacy to have them check and once someone responds will call her back.

## 2018-10-07 NOTE — Telephone Encounter (Signed)
Yes it looks like this patient has had similar antibiotics in the same drug class

## 2018-10-07 NOTE — Telephone Encounter (Signed)
[facility] informed and medication sent to patient to start infusions.

## 2018-10-08 NOTE — Telephone Encounter (Signed)
Perfect

## 2018-10-31 ENCOUNTER — Encounter: Payer: Self-pay | Admitting: Infectious Disease

## 2018-11-05 ENCOUNTER — Ambulatory Visit: Payer: Medicare Other | Admitting: Infectious Disease

## 2018-11-06 ENCOUNTER — Ambulatory Visit: Payer: Medicare Other | Admitting: Infectious Disease

## 2018-11-06 ENCOUNTER — Other Ambulatory Visit: Payer: Self-pay

## 2018-11-06 ENCOUNTER — Encounter: Payer: Self-pay | Admitting: Infectious Disease

## 2018-11-06 VITALS — BP 165/105 | HR 112 | Temp 97.7°F

## 2018-11-06 DIAGNOSIS — B49 Unspecified mycosis: Secondary | ICD-10-CM

## 2018-11-06 DIAGNOSIS — A414 Sepsis due to anaerobes: Secondary | ICD-10-CM | POA: Diagnosis not present

## 2018-11-06 DIAGNOSIS — L02416 Cutaneous abscess of left lower limb: Secondary | ICD-10-CM | POA: Diagnosis not present

## 2018-11-06 DIAGNOSIS — B952 Enterococcus as the cause of diseases classified elsewhere: Secondary | ICD-10-CM | POA: Diagnosis not present

## 2018-11-06 DIAGNOSIS — K651 Peritoneal abscess: Secondary | ICD-10-CM

## 2018-11-06 DIAGNOSIS — M86652 Other chronic osteomyelitis, left thigh: Secondary | ICD-10-CM | POA: Diagnosis not present

## 2018-11-06 MED ORDER — AMOXICILLIN-POT CLAVULANATE 875-125 MG PO TABS: 1.0000 | ORAL_TABLET | Freq: Two times a day (BID) | ORAL | 4 refills | Status: DC

## 2018-11-06 NOTE — Progress Notes (Signed)
Per Dr Tommy Medal  40 cm Single lumen Peripherally Inserted Central Catheter  removed from right basilic . No sutures present. Dressing was clean and dry . Area cleansed with chlorhexidine and petroleum dressing applied. Pt advised no heavy lifting with this arm, leave dressing for 24 hours and call the office if dressing becomes soaked with blood or sharp pain presents.  Pt tolerated procedure well.    Laverle Patter, RN

## 2018-11-06 NOTE — Addendum Note (Signed)
Addended by: Laverle Patter on: 11/06/2018 04:33 PM   Modules accepted: Orders

## 2018-11-06 NOTE — Progress Notes (Signed)
Subjective:    Patient ID: Anthony Dunlap, male    DOB: [birth year], 85 y.o.   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 He is currently residing in a skilled nursing facility and on Zosyn rather than Unasyn. I do not know why this change was made as his microbes were covered by the Unasyn. In any case the abscesses have resolved radiographically and the drains been pulled by interventional radiology.  He did well since I last saw him in March 2020, but then was admitted to the hospital with severe candidal infection with intertrigo, and diarrhea with the catheter in place and candidemia.  He was treated with fluconazole but then developed hepatitis and was changed over to Eraxis and completed a two-week course.  He was not formally seen by an ophthalmologist in the hospital nor afterwards in the clinic.  He deniedhaving any visual changes other than occasionally having itching eyes.  He continues sheltering in place at his home with his wife, son( who also got on the phone call with dad) and only being visited by 2 other people who he employs to help take care of himself and his wife.  I had last physically seen him saw him he was doing better and was able to walk around without much hip pain  However when I conducted a telephone visit with him on June 15 of 2020 he was experiencing worsening right-sided hip pain with ambulation and with times different positions in his bed.  He tells me at the last visit he  mentioned painthat he associates this with having undergone some physical therapy with some external and internal rotation of his hip joint. I saw him in person in late June and his inflammatory markers were elevated I then ordered an MRI on July 16th showed:  IMPRESSION: Osteomyelitis about the symphysis pubis demonstrates some improvement, particularly in the superior pubic rami. There has been only minimal improvement in signal abnormality in the inferior pubic rami.  Tiny fluid collection in the symphysis pubis is new since the prior examination worrisome for abscess.  He had no change in severe right and moderately severe left hip osteoarthritis.  I I arrange for him to be admitted to the hospital directly from clinic last visit.  Interventional radiology able to aspirate the pubic symphysis with 1 mL of bloody fluid being recovered for culture but no organisms grew.  He is placed back on intravenous Unasyn.  Unfortunately taking the Unasyn at home became too burdensome for the son because of it being multiple times per day.  He has been switched over to IV daptomycin x 2 weeks then with addition of ceftriaxone 2 g daily and metronidazole 500 mg 3 times daily to cover gram negatives and anaerobes.  Since having seen him last roughly a month ago he says his pain in his hip is improved quite a bit and is able to walk around more than he was able to but certainly not back to normal.  He has 2 days left of antibiotics and  pick is still in place     Past Medical History:  Diagnosis Date  . 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 Diabetes ([place])   . Drug-induced hepatitis 06/05/2018  . Left eye pain   . Prostate cancer [facility])     Past Surgical History:  Procedure Laterality Date  . HERNIA REPAIR    . IR RADIOLOGIST EVAL & MGMT  03/06/2018  . PROSTATE SURGERY      Family History  Problem Relation Age of Onset  . Pneumonia Father   . Cancer  Sister       Social History   Socioeconomic History  . Marital status: Married    Spouse name: [name]  . Number of children: 3  . Years of education: 9th  . Highest education level: Not on file  Occupational History    Employer: RETIRED    Comment: Retired  Scientific laboratory technician  . Financial resource strain: Not on file  . Food insecurity    Worry: Not on file    Inability: Not on file  . Transportation needs    Medical: Not on file    Non-medical: Not on file  Tobacco Use  . Smoking status: Never Smoker  . Smokeless tobacco: Never Used  Substance and Sexual Activity  . Alcohol use: No  . Drug use: No  . Sexual activity: Not on file  Lifestyle  . Physical activity    Days per week: Not on file    Minutes per session: Not on file  . Stress: Not on file  Relationships  . Social Herbalist on phone: Not on file    Gets together: Not on file    Attends religious service: Not on file    Active member of club or organization: Not on file    Attends meetings of clubs or organizations: Not on file    Relationship status: Not on file  Other Topics Concern  . Not on file  Social History Narrative   Patient lives at home with his wife. [name]) . Patient is retired.   Education 9th grade.   Right handed.   Caffeine None    Allergies  Allergen Reactions  . Fluconazole Other (See Comments)    Drug-induced hepatitis     Current Outpatient Medications:  .  acetaminophen (TYLENOL) 325 MG tablet, Take 2 tablets (650 mg total) by mouth every 6 (six) hours as needed for mild pain, fever or headache., Disp: , Rfl:  .  aspirin EC 81 MG tablet, Take 81 mg by mouth daily., Disp: , Rfl:  .  cefTRIAXone 2 g in sodium chloride 0.9 % 100 mL, Inject 2 g into the vein daily., Disp: 30 g, Rfl:  .  DAPTOmycin (CUBICIN) 500 MG injection, , Disp: , Rfl:  .  ferrous sulfate 325 (65 FE) MG tablet, Take 1 tablet (325 mg total) by mouth daily with breakfast., Disp: , Rfl: 3 .   furosemide (LASIX) 20 MG tablet, Take 20 mg by mouth daily as needed for fluid., Disp: , Rfl:  .  glimepiride (AMARYL) 4 MG tablet, Take 4 mg by mouth daily. , Disp: , Rfl:  .  insulin aspart (NOVOLOG) 100 UNIT/ML injection, Inject 0-15 Units into the skin 3 (three) times daily with meals. Sliding scale insulin Less than 70 initiate hypoglycemia protocol 70-120  0 units 120-150 2 unit 151-200 3 units 201-250 3 units 251-300 5 units 301-350 8 units 351-400 11 units  Greater than 400 15 units , call MD, Disp: 10 mL, Rfl: 11 .  insulin detemir (LEVEMIR) 100 UNIT/ML injection, Inject 0.06 mLs (6 Units total) into the skin daily., Disp: 10 mL, Rfl: 11 .  levothyroxine (SYNTHROID, LEVOTHROID) 100 MCG tablet, Take 100 mcg by mouth daily. , Disp: , Rfl:  .  magnesium oxide (MAG-OX) 400 (241.3 Mg) MG tablet, Take 1 tablet (400 mg total) by mouth daily., Disp: , Rfl:  .  meloxicam (MOBIC) 7.5 MG tablet, Take 7.5 mg by mouth 2 (two) times daily. , Disp: , Rfl:  .  Multiple Vitamin (MULTIVITAMIN WITH MINERALS) TABS tablet, Take 1 tablet by mouth daily., Disp: , Rfl:  .  nutrition supplement, JUVEN, (JUVEN) PACK, Take 1 packet by mouth 2 (two) times daily between meals., Disp: , Rfl: 0 .  nystatin (MYCOSTATIN/NYSTOP) powder, Apply topically 4 (four) times daily., Disp: 60 g, Rfl: 8 .  pravastatin (PRAVACHOL) 10 MG tablet, Take 10 mg by mouth daily., Disp: , Rfl:  .  SOLIQUA 100-33 UNT-MCG/ML SOPN, Inject 45 Units as directed daily. , Disp: , Rfl:    Review of Systems  Constitutional: Negative for activity change, appetite change, chills, diaphoresis, fatigue, fever and unexpected weight change.  HENT: Negative for congestion, postnasal drip, rhinorrhea, sinus pressure, sneezing, sore throat and trouble swallowing.   Eyes: Negative for photophobia and visual disturbance.  Respiratory: Negative for cough, chest tightness, shortness of breath, wheezing and stridor.   Cardiovascular: Negative for chest pain,  palpitations and leg swelling.  Gastrointestinal: Negative for abdominal distention, abdominal pain, anal bleeding, blood in stool, constipation, diarrhea, nausea and vomiting.  Genitourinary: Negative for difficulty urinating, dysuria, flank pain and hematuria.  Musculoskeletal: Positive for arthralgias, gait problem and myalgias. Negative for back pain and joint swelling.  Skin: Negative for color change, pallor, rash and wound.  Neurological: Negative for dizziness, tremors, weakness and light-headedness.  Hematological: Negative for adenopathy. Does not bruise/bleed easily.  Psychiatric/Behavioral: Negative for agitation, behavioral problems, confusion, decreased concentration, dysphoric mood and sleep disturbance.       Objective:   Physical Exam Constitutional:      General: He is not in acute distress.    Appearance: Normal appearance. He is well-developed. He is not ill-appearing or diaphoretic.  HENT:     Head: Normocephalic and atraumatic.     Right Ear: Hearing and external ear normal.     Left Ear: Hearing and external ear normal.     Nose: No nasal deformity or rhinorrhea.  Eyes:     General: No scleral icterus.    Conjunctiva/sclera: Conjunctivae normal.     Right eye: Right conjunctiva is not injected.     Left eye: Left conjunctiva is not injected.  Neck:     Musculoskeletal: Normal range of motion and neck supple.     Vascular: No JVD.  Cardiovascular:     Rate and Rhythm: Regular rhythm. Tachycardia present.     Heart sounds: S1 normal and S2 normal.  Pulmonary:     Effort: Pulmonary effort is normal. No respiratory distress.     Breath sounds: No wheezing.  Abdominal:     General: There is no distension.     Palpations: Abdomen is soft.  Musculoskeletal:     Right shoulder: Normal.     Left shoulder: Normal.     Right hip: He exhibits decreased range of motion.     Left hip: He exhibits decreased range of motion.     Right knee:  Normal.     Left knee:  Normal.  Lymphadenopathy:     Head:     Right side of head: No submandibular, preauricular or posterior auricular adenopathy.     Left side of head: No submandibular, preauricular or posterior auricular adenopathy.     Cervical: No cervical adenopathy.     Right cervical: No superficial or deep cervical adenopathy.    Left cervical: No superficial or deep cervical adenopathy.  Skin:    General: Skin is warm and dry.     Coloration: Skin is not pale.     Findings: No abrasion, bruising, ecchymosis, erythema, lesion or rash.     Nails: There is no clubbing.   Neurological:     General: No focal deficit present.     Mental Status: He is alert and oriented to person, place, and time.     Sensory: No sensory deficit.     Coordination: Coordination normal.     Gait: Gait normal.  Psychiatric:        Attention and Perception: He is attentive.        Mood and Affect: Mood normal.        Speech: Speech normal.        Behavior: Behavior normal. Behavior is cooperative.        Thought Content: Thought content normal.        Judgment: Judgment normal.    PICC line is clean dry and intact       Assessment & Plan:   Polymicrobial infection with pelvic osteomyelitis of the pubic symphysis ":  DC PICC line today  Switch to Augmentin twice daily continue this for several months.   History of serious yeast infection: I will repeat new his prescription of topical antifungal and he is to not be on an azole since it caused hepatitis.   I spent greater than 25 minutes with the patient including greater than 50% of time in face to face counsel of the patient his caregiver regarding the nature of chronic osteomyelitis and its treatment as well as complications including his prior serious yeast infection and in coordination of his care.

## 2018-11-10 ENCOUNTER — Ambulatory Visit: Payer: Medicare Other | Admitting: Infectious Disease

## 2018-12-02 ENCOUNTER — Encounter (HOSPITAL_COMMUNITY): Payer: Self-pay | Admitting: Emergency Medicine

## 2018-12-02 ENCOUNTER — Emergency Department (HOSPITAL_COMMUNITY)
Admission: EM | Admit: 2018-12-02 | Discharge: 2018-12-02 | Disposition: A | Discharge status: Home / Self Care | Payer: Medicare Other | Attending: Emergency Medicine | Admitting: Emergency Medicine

## 2018-12-02 ENCOUNTER — Other Ambulatory Visit: Payer: Self-pay

## 2018-12-02 DIAGNOSIS — M25551 Pain in right hip: Secondary | ICD-10-CM | POA: Insufficient documentation

## 2018-12-02 DIAGNOSIS — Z794 Long term (current) use of insulin: Secondary | ICD-10-CM | POA: Diagnosis not present

## 2018-12-02 DIAGNOSIS — E119 Type 2 diabetes mellitus without complications: Secondary | ICD-10-CM | POA: Insufficient documentation

## 2018-12-02 DIAGNOSIS — Z8739 Personal history of other diseases of the musculoskeletal system and connective tissue: Secondary | ICD-10-CM

## 2018-12-02 DIAGNOSIS — E039 Hypothyroidism, unspecified: Secondary | ICD-10-CM | POA: Insufficient documentation

## 2018-12-02 DIAGNOSIS — M869 Osteomyelitis, unspecified: Secondary | ICD-10-CM | POA: Diagnosis not present

## 2018-12-02 DIAGNOSIS — R251 Tremor, unspecified: Secondary | ICD-10-CM | POA: Diagnosis present

## 2018-12-02 DIAGNOSIS — Z79899 Other long term (current) drug therapy: Secondary | ICD-10-CM | POA: Insufficient documentation

## 2018-12-02 LAB — URINALYSIS, ROUTINE W REFLEX MICROSCOPIC
Bilirubin Urine: NEGATIVE
Glucose, UA: NEGATIVE mg/dL
Hgb urine dipstick: NEGATIVE
Ketones, ur: NEGATIVE mg/dL
Nitrite: POSITIVE — AB
Protein, ur: NEGATIVE mg/dL
Specific Gravity, Urine: 1.006 (ref 1.005–1.030)
pH: 6 (ref 5.0–8.0)

## 2018-12-02 LAB — CBC WITH DIFFERENTIAL/PLATELET
Abs Immature Granulocytes: 0.02 10*3/uL (ref 0.00–0.07)
Basophils Absolute: 0 10*3/uL (ref 0.0–0.1)
Basophils Relative: 0 %
Eosinophils Absolute: 0.4 10*3/uL (ref 0.0–0.5)
Eosinophils Relative: 5 %
HCT: 38.7 % — ABNORMAL LOW (ref 39.0–52.0)
Hemoglobin: 12 g/dL — ABNORMAL LOW (ref 13.0–17.0)
Immature Granulocytes: 0 %
Lymphocytes Relative: 39 %
Lymphs Abs: 3.1 10*3/uL (ref 0.7–4.0)
MCH: 27.9 pg (ref 26.0–34.0)
MCHC: 31 g/dL (ref 30.0–36.0)
MCV: 90 fL (ref 80.0–100.0)
Monocytes Absolute: 0.5 10*3/uL (ref 0.1–1.0)
Monocytes Relative: 7 %
Neutro Abs: 3.9 10*3/uL (ref 1.7–7.7)
Neutrophils Relative %: 49 %
Platelets: 297 10*3/uL (ref 150–400)
RBC: 4.3 MIL/uL (ref 4.22–5.81)
RDW: 14.6 % (ref 11.5–15.5)
WBC: 7.9 10*3/uL (ref 4.0–10.5)
nRBC: 0 % (ref 0.0–0.2)

## 2018-12-02 LAB — COMPREHENSIVE METABOLIC PANEL
ALT: 19 U/L (ref 0–44)
AST: 22 U/L (ref 15–41)
Albumin: 4.1 g/dL (ref 3.5–5.0)
Alkaline Phosphatase: 102 U/L (ref 38–126)
Anion gap: 9 (ref 5–15)
BUN: 27 mg/dL — ABNORMAL HIGH (ref 8–23)
CO2: 26 mmol/L (ref 22–32)
Calcium: 9.5 mg/dL (ref 8.9–10.3)
Chloride: 102 mmol/L (ref 98–111)
Creatinine, Ser: 1.23 mg/dL (ref 0.61–1.24)
GFR calc Af Amer: >60 mL/min (ref >60)
GFR calc non Af Amer: 54 mL/min — ABNORMAL LOW (ref >60)
Glucose, Bld: 131 mg/dL — ABNORMAL HIGH (ref 70–99)
Potassium: 4.5 mmol/L (ref 3.5–5.1)
Sodium: 137 mmol/L (ref 135–145)
Total Bilirubin: 0.6 mg/dL (ref 0.3–1.2)
Total Protein: 8.6 g/dL — ABNORMAL HIGH (ref 6.5–8.1)

## 2018-12-02 LAB — CBG MONITORING, ED
Glucose-Capillary: 54 mg/dL — ABNORMAL LOW (ref 70–99)
Glucose-Capillary: 61 mg/dL — ABNORMAL LOW (ref 70–99)
Glucose-Capillary: 121 mg/dL — ABNORMAL HIGH (ref 70–99)

## 2018-12-02 NOTE — ED Provider Notes (Signed)
[facility] EMERGENCY DEPARTMENT Provider Note   CSN: [id number] Arrival date & time: 12/02/18  1438     History   Chief Complaint Chief Complaint  Patient presents with  . Tremors    HPI Anthony Dunlap is a 85 y.o. male.     Patient is an 85 year old male who presents to the emergency department for evaluation of lab work, and some increase in tremors.  The patient has a history of osteomyelitis involving the right hip and pelvis area.  The patient is on chronic antibiotics.  The family noted today that he seemed to be a little more frail and a little more tremulous than usual.  It was tremors that triggered the osteomyelitis event previously.  The family discussed the patient with the primary care physician.  The primary care physician thought it might be better to bring the patient to the emergency department for evaluation.  Patient presents now for evaluation, and to have lab work done to see the status particularly of his white blood cell count.  The patient states he has not had any chills or fever to be reported.  The patient says he has chronic pain in his hips.  It is particularly aggravating when he attempts to walk.  He has not noted any hot areas about his hip.  He has not noticed any pain in the inguinal area, or noted any inguinal lymph nodes.  No nausea, or vomiting to be reported.  He presents now for evaluation as suggested by his primary physician.  The history is provided by the patient.    Past Medical History:  Diagnosis Date  . 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 Diabetes ([place])   . Drug-induced hepatitis 06/05/2018  . Left eye pain   . Prostate cancer [facility])     Patient Active Problem List   Diagnosis Date Noted  . Pelvic abscess in male [facility]) 09/11/2018  . Drug-induced hepatitis 06/05/2018  . Calculus of gallbladder without cholecystitis without obstruction   . Fungemia/Candida albicans 05/10/2018  . UTI (urinary  tract infection) 05/08/2018  . Complicated UTI (urinary tract infection) 05/08/2018  . 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 Abscess of left thigh   . Pressure injury of skin 02/10/2018  . Enterococcus faecalis infection 01/27/2018  . Abscess   . Psoas abscess ([place]) 01/23/2018  . Sepsis due to Bacteroides species ([place]) 01/22/2018  . Sepsis without acute organ dysfunction ([place])   . DKA (diabetic ketoacidoses) ([place]) 01/17/2018  . Normocytic anemia 01/17/2018  . Chronic anemia 01/03/2018  . Chronic indwelling Foley catheter 01/03/2018  . Elevated alkaline phosphatase level 01/03/2018  . Hypothyroidism 01/03/2018  . Physical deconditioning 01/03/2018  . 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 HLD (hyperlipidemia) 08/19/2013  . Type 2 diabetes mellitus ([place])   . Prostate cancer [facility])     Past Surgical History:  Procedure Laterality Date  . HERNIA REPAIR    . IR RADIOLOGIST EVAL & MGMT  03/06/2018  . PROSTATE SURGERY          Home Medications    Prior to Admission medications   Medication Sig Start Date End Date Taking? Authorizing Provider  acetaminophen (TYLENOL) 325 MG tablet Take 2 tablets (650 mg total) by mouth every 6 (six) hours as needed for mild pain, fever or headache. 02/18/18   Oswald Hillock, MD  amitriptyline (ELAVIL) 10 MG tablet TK 1 T PO QHS 11/28/18  [provider]  amoxicillin-clavulanate (AUGMENTIN) 875-125 MG tablet Take 1 tablet by mouth 2 (two) times daily. 11/06/18   Truman Hayward, MD  aspirin EC 81 MG tablet Take 81 mg by mouth daily.    [provider]  ferrous sulfate 325 (65 FE) MG tablet Take 1 tablet (325 mg total) by mouth daily with breakfast. 02/19/18   Oswald Hillock, MD  furosemide (LASIX) 20 MG tablet Take 20 mg by mouth daily as needed for fluid.    [provider]  glimepiride (AMARYL) 4 MG tablet Take 4 mg by mouth daily.  04/18/18   [provider]  insulin  aspart (NOVOLOG) 100 UNIT/ML injection Inject 0-15 Units into the skin 3 (three) times daily with meals. Sliding scale insulin Less than 70 initiate hypoglycemia protocol 70-120  0 units 120-150 2 unit 151-200 3 units 201-250 3 units 251-300 5 units 301-350 8 units 351-400 11 units  Greater than 400 15 units , call MD 02/18/18   Oswald Hillock, MD  insulin detemir (LEVEMIR) 100 UNIT/ML injection Inject 0.06 mLs (6 Units total) into the skin daily. 05/15/18   Manuella Ghazi, Pratik D, DO  levothyroxine (SYNTHROID, LEVOTHROID) 100 MCG tablet Take 100 mcg by mouth daily.  04/18/18   [provider]  magnesium oxide (MAG-OX) 400 (241.3 Mg) MG tablet Take 1 tablet (400 mg total) by mouth daily. 02/19/18   Oswald Hillock, MD  meloxicam (MOBIC) 7.5 MG tablet Take 7.5 mg by mouth 2 (two) times daily.  04/18/18   [provider]  Multiple Vitamin (MULTIVITAMIN WITH MINERALS) TABS tablet Take 1 tablet by mouth daily. 02/19/18   Oswald Hillock, MD  NOVOLOG FLEXPEN 100 UNIT/ML FlexPen INJECT 0 TO 15 UNITS [place] TID WITH MEALS USING SLIDING SCALE PROVIDED BY MD. MAX OF 50 UNITS PER DAY 10/20/18   [provider]  nutrition supplement, JUVEN, (JUVEN) PACK Take 1 packet by mouth 2 (two) times daily between meals. 02/19/18   Oswald Hillock, MD  nystatin (MYCOSTATIN/NYSTOP) powder Apply topically 4 (four) times daily. 10/06/18   Truman Hayward, MD  pravastatin (PRAVACHOL) 10 MG tablet Take 10 mg by mouth daily.    [provider]  [place] 100-33 UNT-MCG/ML SOPN Inject 45 Units as directed daily.  04/28/18   [provider]    Family History Family History  Problem Relation Age of Onset  . Pneumonia Father   . Cancer Sister     Social History Social History   Tobacco Use  . Smoking status: Never Smoker  . Smokeless tobacco: Never Used  Substance Use Topics  . Alcohol use: No  . Drug use: No     Allergies   Fluconazole   Review of Systems Review of Systems   Constitutional: Negative for activity change, appetite change, chills and fever.  HENT: Negative for congestion, ear discharge, ear pain, facial swelling, nosebleeds, rhinorrhea, sneezing and tinnitus.   Eyes: Negative for photophobia, pain and discharge.  Respiratory: Negative for cough, choking, shortness of breath and wheezing.   Cardiovascular: Negative for chest pain, palpitations and leg swelling.  Gastrointestinal: Negative for abdominal pain, blood in stool, constipation, diarrhea, nausea and vomiting.  Genitourinary: Negative for difficulty urinating, dysuria, flank pain, frequency and hematuria.  Musculoskeletal: Positive for arthralgias. Negative for back pain, gait problem, myalgias and neck pain.  Skin: Negative for color change, rash and wound.  Neurological: Positive for tremors. Negative for dizziness, seizures, syncope, facial asymmetry, speech difficulty,  weakness and numbness.  Hematological: Negative for adenopathy. Does not bruise/bleed easily.  Psychiatric/Behavioral: Negative for agitation, confusion, hallucinations, self-injury and suicidal ideas. The patient is not nervous/anxious.      Physical Exam Updated Vital Signs BP (!) 153/99   Pulse 99   Temp (!) 97.5 F (36.4 C) (Oral)   Resp (P) 16   Ht 5\' 8"  (1.727 m)   Wt 77.6 kg   SpO2 97%   BMI 26.00 kg/m   Physical Exam Vitals signs and nursing note reviewed.  Constitutional:      Appearance: He is well-developed. He is not toxic-appearing.  HENT:     Head: Normocephalic.     Right Ear: Tympanic membrane and external ear normal.     Left Ear: Tympanic membrane and external ear normal.  Eyes:     General: Lids are normal.     Pupils: Pupils are equal, round, and reactive to light.  Neck:     Musculoskeletal: Normal range of motion and neck supple.     Vascular: No carotid bruit.  Cardiovascular:     Rate and Rhythm: Normal rate and regular rhythm.     Pulses: Normal pulses.     Heart sounds:  Normal heart sounds. No gallop.   Pulmonary:     Effort: No respiratory distress.     Breath sounds: Normal breath sounds.  Abdominal:     General: Bowel sounds are normal.     Palpations: Abdomen is soft.     Tenderness: There is no abdominal tenderness. There is no guarding.  Musculoskeletal: Normal range of motion.     Comments: No hot areas appreciated of the right or left hip.  No palpable inguinal nodes appreciated.  There is pain with attempted range of motion of the right greater than the left hip.  There is crepitus with attempted range of motion of the right and left knee.  There is good range of motion of the right and left ankle.  No temperature changes of the right or left lower extremity.  Lymphadenopathy:     Head:     Right side of head: No submandibular adenopathy.     Left side of head: No submandibular adenopathy.     Cervical: No cervical adenopathy.  Skin:    General: Skin is warm and dry.  Neurological:     Mental Status: He is alert and oriented to person, place, and time.     Cranial Nerves: No cranial nerve deficit.     Sensory: No sensory deficit.  Psychiatric:        Speech: Speech normal.      ED Treatments / Results  Labs (all labs ordered are listed, but only abnormal results are displayed) Labs Reviewed  CBC WITH DIFFERENTIAL/PLATELET - Abnormal; Notable for the following components:      Result Value   Hemoglobin 12.0 (*)    HCT 38.7 (*)    All other components within normal limits  COMPREHENSIVE METABOLIC PANEL - Abnormal; Notable for the following components:   Glucose, Bld 131 (*)    BUN 27 (*)    Total Protein 8.6 (*)    GFR calc non Af Amer 54 (*)    All other components within normal limits  CBG MONITORING, ED - Abnormal; Notable for the following components:   Glucose-Capillary 54 (*)    All other components within normal limits  CBG MONITORING, ED - Abnormal; Notable for the following components:   Glucose-Capillary 61 (*)  All  other components within normal limits  CBG MONITORING, ED - Abnormal; Notable for the following components:   Glucose-Capillary 121 (*)    All other components within normal limits  URINALYSIS, ROUTINE W REFLEX MICROSCOPIC    EKG None  Radiology No results found.  Procedures Procedures (including critical care time)  Medications Ordered in ED Medications - No data to display   Initial Impression / Assessment and Plan / ED Course  I have reviewed the triage vital signs and the nursing notes.  Pertinent labs & imaging results that were available during my care of the patient were reviewed by me and considered in my medical decision making (see chart for details).          Final Clinical Impressions(s) / ED Diagnoses MDM  Patient presents to the emergency department at the request of his primary physician.  The family reports that the patient seemed to have more tremors than usual.  They are concerned because he was having tremors when he had a severe bout of osteomyelitis in the past.  No reported fever, or chills.  No nausea, or vomiting.  Patient's capillary blood glucose was low at 51.  The patient states that he had a small breakfast earlier this morning, and had not eaten all day.  The patient was given juice, crackers, peanut butter.  The capillary blood glucose was rechecked and found to be 121.  The complete blood count shows the white blood cells to be normal at 7900.  The hemoglobin is slightly low at 12, but within the patient's usual range.  The hematocrit is also low at 38.7, but also within the patient's usual range.  There is no shift to the left.  The comprehensive metabolic panel shows the glucose to be slightly elevated at 131, the BUN is slightly elevated at 27, otherwise the vital signs are within normal limits.  The anion gap is normal at 9.  I have informed the patient and family of the findings in terms of which they understand.  The patient is awake and  alert and in no distress.  He says that he is hungry and wants to go home.  I have asked the family to return immediately if any fever, chills, weakness, changes in his general condition, changes in his symptoms, problems or concerns.   Final diagnoses:  Right hip pain  History of osteomyelitis    ED Discharge Orders    None       Lily Kocher, PA-C 12/03/18 1612    Nat Christen, MD 12/04/18 1705

## 2018-12-02 NOTE — ED Notes (Signed)
Pt's CBG 54, last ate this morning.  Pt provided applesauce and grape juice.  Crackers and peanut butter provided as well.

## 2018-12-02 NOTE — Discharge Instructions (Addendum)
Your vital signs are nonacute.  Your complete blood count shows your white blood cells to be within normal limits at 7.9.  Your electrolytes are within normal limits.  Please the Dr Gerarda Fraction for follow-up and recheck.  Return to the emergency department if any emergent changes in your condition, worsening of your symptoms, problems, or concerns.

## 2018-12-02 NOTE — ED Triage Notes (Addendum)
Pt sent over by Dr. Gerarda Fraction. Pt was diagnosed with osteomyelitis last year in his hip and has been on intermittent antibiotics since then. Per family, Dr. Gerarda Fraction told pt that he would get blood results quicker in the ED than if the order was placed with home health. Pt has no complaints at this time. Family reported that they noticed an increase in tremors and last time that happened, but was hospitalized for osteomyelitis.

## 2018-12-02 NOTE — ED Notes (Signed)
Pt with foley catheter in place prior, new leg bag placed.

## 2018-12-08 ENCOUNTER — Telehealth: Payer: Self-pay | Admitting: *Deleted

## 2018-12-08 NOTE — Telephone Encounter (Signed)
I am probably going to have to have him continue it indefinitely. WE did not have a good outcome when he came off the last time with recurrence of infection in his pelvis where there are not good operative options. We could consider giving him antibiotic "holidays" I know that he had severe yeast infection with candidiemia and then hepatitis from fluconazole as a complication of therapy from before.But hsi infectino recurred off abx

## 2018-12-08 NOTE — Telephone Encounter (Signed)
Patient's son called to ask how long he would need to take the Augmentin. Patient will fill today, is rescheduled to follow up with Dr Tommy Medal before his next refill.  His son would like to know if he is going to have blood work taken at his next visit.  He also wanted Dr Tommy Medal to be aware that Albaraa was in the ER recently. Landis Gandy, RN

## 2019-01-05 ENCOUNTER — Encounter: Payer: Self-pay | Admitting: Infectious Disease

## 2019-01-05 ENCOUNTER — Other Ambulatory Visit: Payer: Self-pay

## 2019-01-05 ENCOUNTER — Ambulatory Visit: Payer: Medicare Other | Admitting: Infectious Disease

## 2019-01-05 VITALS — BP 167/99 | HR 112 | Wt 167.0 lb

## 2019-01-05 DIAGNOSIS — T50905A Adverse effect of unspecified drugs, medicaments and biological substances, initial encounter: Secondary | ICD-10-CM

## 2019-01-05 DIAGNOSIS — K716 Toxic liver disease with hepatitis, not elsewhere classified: Secondary | ICD-10-CM

## 2019-01-05 DIAGNOSIS — L02416 Cutaneous abscess of left lower limb: Secondary | ICD-10-CM | POA: Diagnosis not present

## 2019-01-05 DIAGNOSIS — M86652 Other chronic osteomyelitis, left thigh: Secondary | ICD-10-CM

## 2019-01-05 DIAGNOSIS — M1611 Unilateral primary osteoarthritis, right hip: Secondary | ICD-10-CM

## 2019-01-05 DIAGNOSIS — A414 Sepsis due to anaerobes: Secondary | ICD-10-CM

## 2019-01-05 DIAGNOSIS — B952 Enterococcus as the cause of diseases classified elsewhere: Secondary | ICD-10-CM | POA: Diagnosis not present

## 2019-01-05 DIAGNOSIS — B49 Unspecified mycosis: Secondary | ICD-10-CM

## 2019-01-05 HISTORY — DX: Unilateral primary osteoarthritis, right hip: M16.11

## 2019-01-05 MED ORDER — AMOXICILLIN-POT CLAVULANATE 875-125 MG PO TABS: 1.0000 | ORAL_TABLET | Freq: Two times a day (BID) | ORAL | 4 refills | Status: DC

## 2019-01-05 MED ORDER — NYSTATIN 100000 UNIT/GM EX POWD: Freq: Four times a day (QID) | CUTANEOUS | 8 refills | Status: DC

## 2019-01-05 NOTE — Progress Notes (Signed)
Subjective:    Patient ID: Anthony Dunlap, male    DOB: Jun 28, 1933, 85 y.o.   MRN: [id number]  Chief Complaint:  Right hip pain, which persists despite having on antibiotics for several months   HPI  85 yomalewith polymicrobial infection with pelvic osteomyelitis of the pubic symphysis and abscess in the adductor sheath and left thigh status post IR placed drains  He had grown enterococcus faecalis that was sensitive to ampicillin and a fairly sensitive Klebsiella pneumoniae anda  bacteroides  Had planned on giving him IV Unasyn for 8 weeks with stop date being March 25, 2018. He is currently residing in a skilled nursing facility and on Zosyn rather than Unasyn. I do not know why this change was made as his microbes were covered by the Unasyn. In any case the abscesses have resolved radiographically and the drains been pulled by interventional radiology.  He did well since I last saw him in March 2020, but then was admitted to the hospital with severe candidal infection with intertrigo, and diarrhea with the catheter in place and candidemia.  He was treated with fluconazole but then developed hepatitis and was changed over to Eraxis and completed a two-week course.  He was not formally seen by an ophthalmologist in the hospital nor afterwards in the clinic.  He deniedhaving any visual changes other than occasionally having itching eyes.  He continues sheltering in place at his home with his wife, son( who also got on the phone call with dad) and only being visited by 2 other people who he employs to help take care of himself and his wife.   when I conducted a telephone visit with him on June 15 of 2020 he was experiencing worsening right-sided hip pain with ambulation and with times different positions in his bed.  I saw him in person in late June and his inflammatory markers were elevated I then ordered an MRI on July 16th showed:  IMPRESSION: Osteomyelitis about  the symphysis pubis demonstrates some improvement, particularly in the superior pubic rami. There has been only minimal improvement in signal abnormality in the inferior pubic rami.  Tiny fluid collection in the symphysis pubis is new since the prior examination worrisome for abscess.  He had no change in severe right and moderately severe left hip osteoarthritis.  I I arranged for him to be admitted to the hospital directly from clinic last visit.  Interventional radiology able to aspirate the pubic symphysis with 1 mL of bloody fluid being recovered for culture but no organisms grew.  He is placed back on intravenous Unasyn.  Unfortunately taking the Unasyn at home became too burdensome for the son because of it being multiple times per day.  He was  switched over to IV daptomycin x 2 weeks then with addition of ceftriaxone 2 g daily and metronidazole 500 mg 3 times daily to cover gram negatives and anaerobes.  Since then he was switched over to Augmentin and continued on that.  He continues to have some pain in his pubic region but also more so in the right hip where he has severe osteoarthritis.  He has not had severe yeast infections while using the topical nystatin powder.  Apparently is walking with more of a limp than before according to family members but he says the pain in his hip is not worse but certainly not improved    Past Medical History:  Diagnosis Date  . 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  Diabetes ([place])   . Drug-induced hepatitis 06/05/2018  . Left eye pain   . Prostate cancer [facility])     Past Surgical History:  Procedure Laterality Date  . HERNIA REPAIR    . IR RADIOLOGIST EVAL & MGMT  03/06/2018  . PROSTATE SURGERY      Family History  Problem Relation Age of Onset  . Pneumonia Father   . Cancer Sister       Social History   Socioeconomic History  . Marital status: Married    Spouse name: [name]  .  Number of children: 3  . Years of education: 9th  . Highest education level: Not on file  Occupational History    Employer: RETIRED    Comment: Retired  Scientific laboratory technician  . Financial resource strain: Not on file  . Food insecurity    Worry: Not on file    Inability: Not on file  . Transportation needs    Medical: Not on file    Non-medical: Not on file  Tobacco Use  . Smoking status: Never Smoker  . Smokeless tobacco: Never Used  Substance and Sexual Activity  . Alcohol use: No  . Drug use: No  . Sexual activity: Not on file  Lifestyle  . Physical activity    Days per week: Not on file    Minutes per session: Not on file  . Stress: Not on file  Relationships  . Social Herbalist on phone: Not on file    Gets together: Not on file    Attends religious service: Not on file    Active member of club or organization: Not on file    Attends meetings of clubs or organizations: Not on file    Relationship status: Not on file  Other Topics Concern  . Not on file  Social History Narrative   Patient lives at home with his wife. [name]) . Patient is retired.   Education 9th grade.   Right handed.   Caffeine None    Allergies  Allergen Reactions  . Fluconazole Other (See Comments)    Drug-induced hepatitis     Current Outpatient Medications:  .  acetaminophen (TYLENOL) 325 MG tablet, Take 2 tablets (650 mg total) by mouth every 6 (six) hours as needed for mild pain, fever or headache., Disp: , Rfl:  .  amitriptyline (ELAVIL) 10 MG tablet, Take 10 mg by mouth at bedtime. , Disp: , Rfl:  .  amoxicillin-clavulanate (AUGMENTIN) 875-125 MG tablet, Take 1 tablet by mouth 2 (two) times daily., Disp: 60 tablet, Rfl: 4 .  aspirin EC 81 MG tablet, Take 81 mg by mouth at bedtime. , Disp: , Rfl:  .  ferrous sulfate 325 (65 FE) MG tablet, Take 1 tablet (325 mg total) by mouth daily with breakfast. (Patient taking differently: Take 325 mg by mouth every evening. ), Disp: , Rfl: 3  .  furosemide (LASIX) 20 MG tablet, Take 20 mg by mouth every morning. , Disp: , Rfl:  .  glimepiride (AMARYL) 4 MG tablet, Take 4 mg by mouth every morning. , Disp: , Rfl:  .  insulin aspart (NOVOLOG) 100 UNIT/ML injection, Inject 0-15 Units into the skin 3 (three) times daily with meals. Sliding scale insulin Less than 70 initiate hypoglycemia protocol 70-120  0 units 120-150 2 unit 151-200 3 units 201-250 3 units 251-300 5 units 301-350 8 units 351-400 11 units  Greater than 400 15 units , call MD (Patient taking differently:  Inject 0-15 Units into the skin See admin instructions. Sliding scale insulin as needed for high blood sugars Less than 70 initiate hypoglycemia protocol 70-120  1 units 120-150 3 unit 151-200 4 units 201-250 5 units 251-300 6 units 301-350 9units 351-400 12 units  Greater than 400 15 units , call MD), Disp: 10 mL, Rfl: 11 .  levothyroxine (SYNTHROID, LEVOTHROID) 100 MCG tablet, Take 100 mcg by mouth every morning. , Disp: , Rfl:  .  meloxicam (MOBIC) 7.5 MG tablet, Take 7.5 mg by mouth 2 (two) times daily. , Disp: , Rfl:  .  nystatin (MYCOSTATIN/NYSTOP) powder, Apply topically 4 (four) times daily. (Patient taking differently: Apply topically 4 (four) times daily. For yeast due to catheter), Disp: 60 g, Rfl: 8 .  pravastatin (PRAVACHOL) 10 MG tablet, Take 10 mg by mouth every evening. , Disp: , Rfl:  .  SOLIQUA 100-33 UNT-MCG/ML SOPN, Inject 44 Units as directed every morning. , Disp: , Rfl:    Review of Systems  Constitutional: Negative for activity change, appetite change, chills, diaphoresis, fatigue, fever and unexpected weight change.  HENT: Negative for congestion, postnasal drip, rhinorrhea, sinus pressure, sneezing, sore throat and trouble swallowing.   Eyes: Negative for photophobia and visual disturbance.  Respiratory: Negative for cough, chest tightness, shortness of breath, wheezing and stridor.   Cardiovascular: Negative for chest pain, palpitations and leg  swelling.  Gastrointestinal: Negative for abdominal distention, abdominal pain, anal bleeding, blood in stool, constipation, diarrhea, nausea and vomiting.  Genitourinary: Negative for difficulty urinating, dysuria, flank pain and hematuria.  Musculoskeletal: Positive for arthralgias, gait problem and myalgias. Negative for back pain and joint swelling.  Skin: Negative for color change, pallor, rash and wound.  Neurological: Negative for dizziness, tremors, weakness and light-headedness.  Hematological: Negative for adenopathy. Does not bruise/bleed easily.  Psychiatric/Behavioral: Negative for agitation, behavioral problems, confusion, decreased concentration, dysphoric mood and sleep disturbance.       Objective:   Physical Exam Constitutional:      General: He is not in acute distress.    Appearance: Normal appearance. He is well-developed. He is not ill-appearing or diaphoretic.  HENT:     Head: Normocephalic and atraumatic.     Right Ear: Hearing and external ear normal.     Left Ear: Hearing and external ear normal.     Nose: No nasal deformity or rhinorrhea.  Eyes:     General: No scleral icterus.    Conjunctiva/sclera: Conjunctivae normal.     Right eye: Right conjunctiva is not injected.     Left eye: Left conjunctiva is not injected.  Neck:     Musculoskeletal: Normal range of motion and neck supple.     Vascular: No JVD.  Cardiovascular:     Rate and Rhythm: Regular rhythm. Tachycardia present.     Heart sounds: S1 normal and S2 normal.  Pulmonary:     Effort: Pulmonary effort is normal. No respiratory distress.     Breath sounds: No wheezing.  Abdominal:     General: There is no distension.     Palpations: Abdomen is soft.  Musculoskeletal:     Right shoulder: Normal.     Left shoulder: Normal.     Right hip: He exhibits decreased range of motion.     Left hip: He exhibits decreased range of motion.     Right knee: Normal.     Left knee: Normal.   Lymphadenopathy:     Head:     Right side of  head: No submandibular, preauricular or posterior auricular adenopathy.     Left side of head: No submandibular, preauricular or posterior auricular adenopathy.     Cervical: No cervical adenopathy.     Right cervical: No superficial or deep cervical adenopathy.    Left cervical: No superficial or deep cervical adenopathy.  Skin:    General: Skin is warm and dry.     Coloration: Skin is not pale.     Findings: No abrasion, bruising, ecchymosis, erythema, lesion or rash.     Nails: There is no clubbing.   Neurological:     General: No focal deficit present.     Mental Status: He is alert and oriented to person, place, and time.     Sensory: No sensory deficit.     Coordination: Coordination normal.     Gait: Gait normal.  Psychiatric:        Attention and Perception: He is attentive.        Mood and Affect: Mood normal.        Speech: Speech is delayed.        Behavior: Behavior normal. Behavior is cooperative.        Cognition and Memory: Cognition normal.        Judgment: Judgment normal.         Assessment & Plan:   Polymicrobial infection with pelvic osteomyelitis of the pubic symphysis ":  Augmentin twice daily continue this while checking inflammatory markers.  I will also order MRI of the hip and pelvis to see how he is doing especially given him my concern that soon elective imaging will be  off the table as novel coronavirus is 19 cases continue to spike   History of serious yeast infection: I will repeat new his prescription of topical antifungal and he is to not be on an azole since it caused hepatitis.  Right hip severe osteoarthritis: Certainly surgery in the form of hip replacement recently could be considered but I would be a bit concerned given his problems with infection recently though we can try to keep him on antibiotics chronically I also wonder about how well he would be able to tolerate such a surgery.  He does  have an orthopedist in [place] that he can check in with.  I spent greater than 25 minutes with the patient including greater than 50% of time in face to face counsel of the patient and his caregiver regarding the nature of these types of severe infections and how our options in terms of surgical interventions are limited, and why would like to continue giving him antibiotics in the form of Augmentin and coordination of his care

## 2019-01-06 LAB — CBC WITH DIFFERENTIAL/PLATELET
Absolute Monocytes: 511 cells/uL (ref 200–950)
Basophils Absolute: 29 cells/uL (ref 0–200)
Basophils Relative: 0.4 %
Eosinophils Absolute: 380 cells/uL (ref 15–500)
Eosinophils Relative: 5.2 %
HCT: 35.6 % — ABNORMAL LOW (ref 38.5–50.0)
Hemoglobin: 11.8 g/dL — ABNORMAL LOW (ref 13.2–17.1)
Lymphs Abs: 2110 cells/uL (ref 850–3900)
MCH: 28.6 pg (ref 27.0–33.0)
MCHC: 33.1 g/dL (ref 32.0–36.0)
MCV: 86.4 fL (ref 80.0–100.0)
MPV: 11 fL (ref 7.5–12.5)
Monocytes Relative: 7 %
Neutro Abs: 4271 cells/uL (ref 1500–7800)
Neutrophils Relative %: 58.5 %
Platelets: 275 10*3/uL (ref 140–400)
RBC: 4.12 10*6/uL — ABNORMAL LOW (ref 4.20–5.80)
RDW: 13.1 % (ref 11.0–15.0)
Total Lymphocyte: 28.9 %
WBC: 7.3 10*3/uL (ref 3.8–10.8)

## 2019-01-06 LAB — SEDIMENTATION RATE: Sed Rate: 36 mm/h — ABNORMAL HIGH (ref 0–20)

## 2019-01-06 LAB — BASIC METABOLIC PANEL WITH GFR
BUN/Creatinine Ratio: 20 (calc) (ref 6–22)
BUN: 30 mg/dL — ABNORMAL HIGH (ref 7–25)
CO2: 29 mmol/L (ref 20–32)
Calcium: 9.6 mg/dL (ref 8.6–10.3)
Chloride: 103 mmol/L (ref 98–110)
Creat: 1.48 mg/dL — ABNORMAL HIGH (ref 0.70–1.11)
GFR, Est African American: 50 mL/min/{1.73_m2} — ABNORMAL LOW (ref >=60)
GFR, Est Non African American: 43 mL/min/{1.73_m2} — ABNORMAL LOW (ref >=60)
Glucose, Bld: 107 mg/dL — ABNORMAL HIGH (ref 65–99)
Potassium: 4.5 mmol/L (ref 3.5–5.3)
Sodium: 140 mmol/L (ref 135–146)

## 2019-01-06 LAB — C-REACTIVE PROTEIN: CRP: 3.1 mg/L (ref <8.0)

## 2019-01-08 ENCOUNTER — Telehealth: Payer: Self-pay | Admitting: *Deleted

## 2019-01-08 NOTE — Telephone Encounter (Signed)
That's OK with me

## 2019-01-08 NOTE — Telephone Encounter (Signed)
Patient and his companion called to get the results of his recent labs. Gave him the information about his Sed Rate and CRP results and he was interested in the WBC count as well. He also stated that he does not want to have the MRI [facility] ordered because he is not ready to have surgery any time soon. Advised him will let the provider and scheduler know.

## 2019-01-09 ENCOUNTER — Telehealth: Payer: Self-pay | Admitting: *Deleted

## 2019-01-09 NOTE — Telephone Encounter (Signed)
Patient's daughter [name] called for update, as she was unable to attend her father's appointment.  RN relayed Dr Derek Mound plan to refill augmentin and nystatin, to get an MRI while non-emergent outpatient MRI is still an option in light of rising Covid numbers, and risk of infection during surgery.  [name] is asking if Physical Therapy would be appropriate, will follow up with orthopedics and his primary care doctor. She wants to try to convince her father to do the MRI while it is an outpatient option. Landis Gandy, RN

## 2019-01-09 NOTE — Telephone Encounter (Signed)
Thanks Michelle

## 2019-01-14 ENCOUNTER — Ambulatory Visit: Payer: Medicare Other | Admitting: Infectious Disease

## 2019-01-25 ENCOUNTER — Other Ambulatory Visit: Payer: Self-pay

## 2019-01-25 ENCOUNTER — Ambulatory Visit
Admission: RE | Admit: 2019-01-25 | Discharge: 2019-01-25 | Disposition: A | Discharge status: Home / Self Care | Payer: Medicare Other | Source: Ambulatory Visit | Attending: Infectious Disease | Admitting: Infectious Disease

## 2019-01-25 DIAGNOSIS — M86652 Other chronic osteomyelitis, left thigh: Secondary | ICD-10-CM

## 2019-01-25 MED ORDER — GADOBENATE DIMEGLUMINE 529 MG/ML IV SOLN
13.0000 mL | Freq: Once | INTRAVENOUS | Status: AC | PRN
  Administered 2019-01-25: 15:00:00 13 mL via INTRAVENOUS

## 2019-01-28 ENCOUNTER — Telehealth: Payer: Self-pay

## 2019-01-28 NOTE — Telephone Encounter (Signed)
-----   Message from Truman Hayward, MD sent at 01/26/2019 10:39 AM EST ----- MRI is encouraging but would like to continue antibiotics

## 2019-01-28 NOTE — Telephone Encounter (Signed)
Patient returned call and made aware of results. Anthony Dunlap

## 2019-01-28 NOTE — Telephone Encounter (Signed)
Attempted to reach patient regarding results. Caregiver of patient's wife stated he was unavailable at this time. Will attempt to reach again. Anthony Dunlap

## 2019-02-04 ENCOUNTER — Ambulatory Visit: Payer: Medicare Other | Admitting: Orthopaedic Surgery

## 2019-02-18 ENCOUNTER — Ambulatory Visit: Payer: Self-pay

## 2019-02-18 ENCOUNTER — Encounter: Payer: Self-pay | Admitting: Orthopaedic Surgery

## 2019-02-18 ENCOUNTER — Other Ambulatory Visit: Payer: Self-pay

## 2019-02-18 ENCOUNTER — Ambulatory Visit (INDEPENDENT_AMBULATORY_CARE_PROVIDER_SITE_OTHER): Payer: Medicare Other | Admitting: Orthopaedic Surgery

## 2019-02-18 VITALS — Ht 68.0 in | Wt 169.0 lb

## 2019-02-18 DIAGNOSIS — M1611 Unilateral primary osteoarthritis, right hip: Secondary | ICD-10-CM | POA: Diagnosis not present

## 2019-02-18 NOTE — Progress Notes (Signed)
Office Visit Note   Patient: Anthony Dunlap           Date of Birth: 13-Apr-1933           MRN: [id number] Visit Date: 02/18/2019              Requested by: [name], [address] [place],  [place] [id number] PCP: [name], MD   Assessment & Plan: Visit Diagnoses:  1. Unilateral primary osteoarthritis, right hip     Plan: End-stage osteoarthritis right hip with collapse of the femoral head and absence of any joint.  Has chronic osteomyelitis of the pubis and being followed through the infectious disease department at [facility].  Long discussion regarding the arthritis of his right hip with family member and Anthony Dunlap.  Definitive treatment would be total hip replacement.  He needs to have clearance from his urologist, infectious disease specialist ([name]) and his primary care physician.  Return in 1 month.  Long discussion over 45 minutes regarding definitive treatment for his hip and the potential problems based on of his multiple comorbidities.  Follow-Up Instructions: Return in about 1 month (around 03/21/2019).   Orders:  No orders of the defined types were placed in this encounter.  No orders of the defined types were placed in this encounter.     Procedures: No procedures performed   Clinical Data: No additional findings.   Subjective: Chief Complaint  Patient presents with  . Right Hip - Pain  Patient presents today for right hip pain. He said that it has hurt him for a couple years, but has worsened in the last year since developing osteomyelitis in his pelvis. He has been on antibiotics orally or by IV  This year. He is currently on oral antibiotics. He had his most recent MRI done on 01/25/2019. His pain is lateral right hip and groin area. No numbness or tingling in his lower extremities. He takes Meloxicam for pain. Patient's son is with him today and wonders if therapy would be beneficial or not.   Anthony Dunlap has had a history of prior  prostate cancer "20 years ago" with radioactive seed implant.  Within the past year he developed an osteomyelitis of the pubis and was treated with inpatient IV antibiotics and outpatient antibiotics.  He continues to be followed at the infectious disease clinic at [facility] and continues with antibiotics-amoxicillin-clavulanate 875-125 twice a day.  He has had progressive pain in his right groin with a diagnosis of arthritis.  He recently had an MRI scan of his pelvis to follow-up on his chronic osteomyelitis.  Scan was performed January 25, 2019.  There was evidence of overall improvement in the pubic bone marrow edema and enhancement without evidence of progressive osteomyelitis.  There was stable severe right hip osteoarthritis. He is diabetic.  He also has a history of bladder insufficiency and has a chronic indwelling catheter  HPI  Review of Systems   Objective: Vital Signs: Ht 5\' 8"  (1.727 m)   Wt 169 lb (76.7 kg)   BMI 25.70 kg/m   Physical Exam Constitutional:      Appearance: He is well-developed.  Eyes:     Pupils: Pupils are equal, round, and reactive to light.  Pulmonary:     Effort: Pulmonary effort is normal.  Skin:    General: Skin is warm and dry.  Neurological:     Mental Status: He is alert and oriented to person, place, and time.  Psychiatric:  Behavior: Behavior normal.     Ortho Exam awake alert and oriented x3.  Comfortable sitting.  Accompanied by his son.  Uses a walker.  There was no motion of his right hip with internal and external rotation.  He has chronic slight external rotation of position.  He is about three quarters of an inch short on the right compared to the left.  Good capillary refill to his foot.  Mild pitting edema of both ankles.  Sensory intact.  Pain with any movement of his right hip.  Straight leg raise negative.  Specialty Comments:  No specialty comments available.  Imaging: No results found.   PMFS History: Patient Active  Problem List   Diagnosis Date Noted  . Unilateral primary osteoarthritis, right hip 01/05/2019  . Pelvic abscess in male [facility]) 09/11/2018  . Drug-induced hepatitis 06/05/2018  . Calculus of gallbladder without cholecystitis without obstruction   . Fungemia/Candida albicans 05/10/2018  . UTI (urinary tract infection) 05/08/2018  . Complicated UTI (urinary tract infection) 05/08/2018  . 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 ([place]) 04/21/2018  . Abscess of left thigh   . Pressure injury of skin 02/10/2018  . Enterococcus faecalis infection 01/27/2018  . Abscess   . 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 Sepsis due to Bacteroides species ([place]) 01/22/2018  . Sepsis without acute organ dysfunction ([place])   . DKA (diabetic ketoacidoses) ([place]) 01/17/2018  . Normocytic anemia 01/17/2018  . Chronic anemia 01/03/2018  . Chronic indwelling Foley catheter 01/03/2018  . Elevated alkaline phosphatase level 01/03/2018  . Hypothyroidism 01/03/2018  . Physical deconditioning 01/03/2018  . 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 HLD (hyperlipidemia) 08/19/2013  . Type 2 diabetes mellitus ([place])   . Prostate cancer [facility])    Past Medical History:  Diagnosis Date  . 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 ([place]) 04/21/2018  . Diabetes ([place])   . Drug-induced hepatitis 06/05/2018  . Left eye pain   . Localized osteoarthrosis of right hip 01/05/2019  . Prostate cancer [facility])     Family History  Problem Relation Age of Onset  . Pneumonia Father   . Cancer Sister     Past Surgical History:  Procedure Laterality Date  . HERNIA REPAIR    . IR RADIOLOGIST EVAL & MGMT  03/06/2018  . PROSTATE SURGERY     Social History   Occupational History    Employer: RETIRED    Comment: Retired  Tobacco Use  . Smoking status: Never Smoker  . Smokeless tobacco: Never Used  Substance and Sexual Activity  . Alcohol use: No  . Drug use: No  . Sexual activity: Not on file

## 2019-02-18 NOTE — Addendum Note (Signed)
Addended by: Garald Balding on: 02/18/2019 12:30 PM   Modules accepted: Orders

## 2019-03-03 ENCOUNTER — Encounter: Payer: Self-pay | Admitting: Infectious Disease

## 2019-03-03 ENCOUNTER — Ambulatory Visit: Payer: Medicare Other | Admitting: Infectious Disease

## 2019-03-03 ENCOUNTER — Other Ambulatory Visit: Payer: Self-pay

## 2019-03-03 VITALS — BP 151/89 | HR 104 | Temp 97.6°F | Resp 12 | Ht 68.0 in | Wt 171.0 lb

## 2019-03-03 DIAGNOSIS — M86659 Other chronic osteomyelitis, unspecified thigh: Secondary | ICD-10-CM

## 2019-03-03 DIAGNOSIS — B952 Enterococcus as the cause of diseases classified elsewhere: Secondary | ICD-10-CM | POA: Diagnosis not present

## 2019-03-03 DIAGNOSIS — L02416 Cutaneous abscess of left lower limb: Secondary | ICD-10-CM

## 2019-03-03 DIAGNOSIS — M86151 Other acute osteomyelitis, right femur: Secondary | ICD-10-CM | POA: Diagnosis not present

## 2019-03-03 DIAGNOSIS — A498 Other bacterial infections of unspecified site: Secondary | ICD-10-CM

## 2019-03-03 DIAGNOSIS — A414 Sepsis due to anaerobes: Secondary | ICD-10-CM

## 2019-03-03 DIAGNOSIS — K651 Peritoneal abscess: Secondary | ICD-10-CM

## 2019-03-03 DIAGNOSIS — Z794 Long term (current) use of insulin: Secondary | ICD-10-CM

## 2019-03-03 DIAGNOSIS — E1165 Type 2 diabetes mellitus with hyperglycemia: Secondary | ICD-10-CM

## 2019-03-03 MED ORDER — NYSTATIN 100000 UNIT/GM EX POWD: Freq: Four times a day (QID) | CUTANEOUS | 8 refills | Status: DC

## 2019-03-03 MED ORDER — AMOXICILLIN-POT CLAVULANATE 875-125 MG PO TABS: 1.0000 | ORAL_TABLET | Freq: Two times a day (BID) | ORAL | 11 refills | Status: DC

## 2019-03-03 NOTE — Progress Notes (Signed)
Subjective:    Patient ID: Anthony Dunlap, male    DOB: 1933/07/25, 86 y.o.   MRN: [id number]  Chief Complaint:  Right hip pain, with some groin pain as well.   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 He is currently residing in a skilled nursing facility and on Zosyn rather than Unasyn. I do not know why this change was made as his microbes were covered by the Unasyn. In any case the abscesses have resolved radiographically and the drains been pulled by interventional radiology.  He did well since I last saw him in March 2020, but then was admitted to the hospital with severe candidal infection with intertrigo, and diarrhea with the catheter in place and candidemia.  He was treated with fluconazole but then developed hepatitis and was changed over to Eraxis and completed a two-week course.  He was not formally seen by an ophthalmologist in the hospital nor afterwards in the clinic.  He deniedhaving any visual changes other than occasionally having itching eyes.  He continued sheltering in place at his home with his wife, son( who also got on the phone call with dad) and only being visited by 2 other people who he employs to help take care of himself and his wife.   when I conducted a telephone visit with him on June 15 of 2020 he was experiencing worsening right-sided hip pain with ambulation and with times different positions in his bed.  I saw him in person in late June and his inflammatory markers were elevated I then ordered an MRI on July 16th showed:  IMPRESSION: Osteomyelitis about the symphysis pubis demonstrates  some improvement, particularly in the superior pubic rami. There has been only minimal improvement in signal abnormality in the inferior pubic rami.  Tiny fluid collection in the symphysis pubis is new since the prior examination worrisome for abscess.  He had no change in severe right and moderately severe left hip osteoarthritis.  I I arranged for him to be admitted to the hospital directly from clinic last visit.  Interventional radiology able to aspirate the pubic symphysis with 1 mL of bloody fluid being recovered for culture but no organisms grew.  He is placed back on intravenous Unasyn.  Unfortunately taking the Unasyn at home became too burdensome for the son because of it being multiple times per day.  He was  switched over to IV daptomycin x 2 weeks then with addition of ceftriaxone 2 g daily and metronidazole 500 mg 3 times daily to cover gram negatives and anaerobes.  Since then he was switched over to Augmentin and continued on that.  He continues to have some pain in his pubic region but also more so in the right hip where he has severe osteoarthritis.  He has not had severe yeast infections while using the topical nystatin powder.  After he last saw him we got an MRI of the hip and pelvis in December 2020 which showed:   IMPRESSION: 1. Overall improved appearance of the pubic bone marrow edema and enhancement. No evidence of progressive osteomyelitis. 2. Stable small peripherally enhancing fluid collection at the symphysis pubis. 3. No  new or enlarging fluid collections. The soft tissue inflammatory changes in the inferior pelvis have improved. 4. Stable severe right hip osteoarthritis.   He has been seen by Joni Fears with orthopedic surgery who completely destroyed.  Patient is here for "preoperative clearance "from infectious disease standpoint.  I have course do have apprehension about infection and morbidity of surgery in general in this  gentleman.  I have specific anxiety about the infection and the symphysis pubis potentially spreading to newly implanted prosthetic hip.  That being said the infection does seem confined to the symphysis pubis there is not appear to be any osteomyelitis at the site of his severe osteoarthritic changes, and if we keep him on antibiotics it would seem low risk of that infection spreading to the total hip arthroplasty.    Past Medical History:  Diagnosis Date  . 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 Diabetes ([place])   . Drug-induced hepatitis 06/05/2018  . Left eye pain   . Localized osteoarthrosis of right hip 01/05/2019  . Prostate cancer [facility])     Past Surgical History:  Procedure Laterality Date  . HERNIA REPAIR    . IR RADIOLOGIST EVAL & MGMT  03/06/2018  . PROSTATE SURGERY      Family History  Problem Relation Age of Onset  . Pneumonia Father   . Cancer Sister       Social History   Socioeconomic History  . Marital status: Married    Spouse name: Anthony Dunlap  . Number of children: 3  . Years of education: 9th  . Highest education level: Not on file  Occupational History    Employer: RETIRED    Comment: Retired  Tobacco Use  . Smoking status: Never Smoker  . Smokeless tobacco: Never Used  Substance and Sexual Activity  . Alcohol use: No  . Drug use: No  . Sexual activity: Not on file  Other Topics Concern  . Not on file  Social History Narrative   Patient lives at home with his wife. Anthony Dunlap) . Patient is retired.   Education 9th grade.   Right handed.   Caffeine None   Social Determinants of Health   Financial Resource Strain:   . Difficulty of Paying Living Expenses: Not on file  Food Insecurity:   . Worried About Charity fundraiser in the Last Year: Not on file  . Ran Out of Food in the Last Year: Not on file  Transportation Needs:   . Lack of Transportation (Medical): Not on file  . Lack of Transportation  (Non-Medical): Not on file  Physical Activity:   . Days of Exercise per Week: Not on file  . Minutes of Exercise per Session: Not on file  Stress:   . Feeling of Stress : Not on file  Social Connections:   . Frequency of Communication with Friends and Family: Not on file  . Frequency of Social Gatherings with Friends and Family: Not on file  . Attends Religious Services: Not on file  . Active Member of Clubs or Organizations: Not on file  . Attends Archivist Meetings: Not on file  . Marital Status: Not on file    Allergies  Allergen Reactions  . Fluconazole Other (See Comments)    Drug-induced hepatitis     Current Outpatient Medications:  .  acetaminophen (TYLENOL) 325 MG tablet, Take 2 tablets (650 mg total) by mouth every 6 (six) hours as needed for mild pain, fever  or headache., Disp: , Rfl:  .  amitriptyline (ELAVIL) 10 MG tablet, Take 10 mg by mouth at bedtime. , Disp: , Rfl:  .  amoxicillin-clavulanate (AUGMENTIN) 875-125 MG tablet, Take 1 tablet by mouth 2 (two) times daily., Disp: 60 tablet, Rfl: 4 .  aspirin EC 81 MG tablet, Take 81 mg by mouth at bedtime. , Disp: , Rfl:  .  ferrous sulfate 325 (65 FE) MG tablet, Take 1 tablet (325 mg total) by mouth daily with breakfast. (Patient taking differently: Take 325 mg by mouth every evening. ), Disp: , Rfl: 3 .  furosemide (LASIX) 20 MG tablet, Take 20 mg by mouth every morning. , Disp: , Rfl:  .  glimepiride (AMARYL) 4 MG tablet, Take 4 mg by mouth every morning. , Disp: , Rfl:  .  insulin aspart (NOVOLOG) 100 UNIT/ML injection, Inject 0-15 Units into the skin 3 (three) times daily with meals. Sliding scale insulin Less than 70 initiate hypoglycemia protocol 70-120  0 units 120-150 2 unit 151-200 3 units 201-250 3 units 251-300 5 units 301-350 8 units 351-400 11 units  Greater than 400 15 units , call MD (Patient taking differently: Inject 0-15 Units into the skin See admin instructions. Sliding scale insulin as needed  for high blood sugars Less than 70 initiate hypoglycemia protocol 70-120  1 units 120-150 3 unit 151-200 4 units 201-250 5 units 251-300 6 units 301-350 9units 351-400 12 units  Greater than 400 15 units , call MD), Disp: 10 mL, Rfl: 11 .  levothyroxine (SYNTHROID, LEVOTHROID) 100 MCG tablet, Take 100 mcg by mouth every morning. , Disp: , Rfl:  .  meloxicam (MOBIC) 7.5 MG tablet, Take 7.5 mg by mouth 2 (two) times daily. , Disp: , Rfl:  .  nystatin (MYCOSTATIN/NYSTOP) powder, Apply topically 4 (four) times daily., Disp: 60 g, Rfl: 8 .  pravastatin (PRAVACHOL) 10 MG tablet, Take 10 mg by mouth every evening. , Disp: , Rfl:  .  SOLIQUA 100-33 UNT-MCG/ML SOPN, Inject 44 Units as directed every morning. , Disp: , Rfl:    Review of Systems  Constitutional: Negative for activity change, appetite change, chills, diaphoresis, fatigue, fever and unexpected weight change.  HENT: Negative for congestion, dental problem, postnasal drip, rhinorrhea, sinus pressure, sneezing, sore throat and trouble swallowing.   Eyes: Negative for photophobia and visual disturbance.  Respiratory: Negative for cough, chest tightness, shortness of breath, wheezing and stridor.   Cardiovascular: Negative for chest pain, palpitations and leg swelling.  Gastrointestinal: Negative for abdominal distention, abdominal pain, anal bleeding, blood in stool, constipation, diarrhea, nausea and vomiting.  Genitourinary: Negative for difficulty urinating, dysuria, flank pain and hematuria.  Musculoskeletal: Positive for arthralgias, gait problem and myalgias. Negative for back pain and joint swelling.  Skin: Negative for color change, pallor, rash and wound.  Neurological: Negative for dizziness, tremors, weakness and light-headedness.  Hematological: Negative for adenopathy. Does not bruise/bleed easily.  Psychiatric/Behavioral: Negative for agitation, behavioral problems, confusion, decreased concentration, dysphoric mood and sleep  disturbance.       Objective:   Physical Exam Constitutional:      General: He is not in acute distress.    Appearance: Normal appearance. He is well-developed. He is not ill-appearing or diaphoretic.  HENT:     Head: Normocephalic and atraumatic.     Right Ear: Hearing and external ear normal.     Left Ear: Hearing and external ear normal.     Nose: No nasal deformity or rhinorrhea.  Eyes:  General: No scleral icterus.    Conjunctiva/sclera: Conjunctivae normal.     Right eye: Right conjunctiva is not injected.     Left eye: Left conjunctiva is not injected.  Neck:     Vascular: No JVD.  Cardiovascular:     Rate and Rhythm: Regular rhythm. Tachycardia present.     Heart sounds: S1 normal and S2 normal.  Pulmonary:     Effort: Pulmonary effort is normal. No respiratory distress.     Breath sounds: No wheezing.  Abdominal:     General: There is no distension.     Palpations: Abdomen is soft.  Musculoskeletal:     Right shoulder: Normal.     Left shoulder: Normal.     Cervical back: Normal range of motion and neck supple.     Right hip: Decreased range of motion.     Left hip: Decreased range of motion.     Right knee: Normal.     Left knee: Normal.  Lymphadenopathy:     Head:     Right side of head: No submandibular, preauricular or posterior auricular adenopathy.     Left side of head: No submandibular, preauricular or posterior auricular adenopathy.     Cervical: No cervical adenopathy.     Right cervical: No superficial or deep cervical adenopathy.    Left cervical: No superficial or deep cervical adenopathy.  Skin:    General: Skin is warm and dry.     Coloration: Skin is not pale.     Findings: No abrasion, bruising, ecchymosis, erythema, lesion or rash.     Nails: There is no clubbing.  Neurological:     General: No focal deficit present.     Mental Status: He is alert and oriented to person, place, and time. Mental status is at baseline.     Sensory: No  sensory deficit.     Coordination: Coordination normal.     Gait: Gait normal.  Psychiatric:        Attention and Perception: He is attentive.        Mood and Affect: Mood normal.        Speech: Speech is delayed.        Behavior: Behavior normal. Behavior is cooperative.        Cognition and Memory: Cognition normal.        Judgment: Judgment normal.         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  Right hip severe osteoarthritis:   It seems to me that his right hip osteoarthritis and pain from this is significantly pitting his quality of life.  If a right total hip arthroplasty could significantly ameliorate this and it sounds like it could then the benefit of this to me would seem to outweigh the risk of infection from the surgery itself fourth of infection spreading from the symphysis pubis.  I am not comfortable with the term "clearance" but I would find the risk of infection acceptable given how much she is suffering from his right hip osteoarthritis.  That being said I would not be in favor  of the surgery being done anytime soon.  Is my understanding that [facility] will be closing all elective surgeries as of March 16, 2019.  Is also my understanding that the projected Covid numbers for healthcare system may reach 350 by February 1.  I would be concerned that this patient might be at risk of getting Covid infection in the hospital but also would have some concerns about downstream effects of him potentially having a protracted hospitalization and taking up a much-needed bed that could  be used by urgent critically ill patients that might be able to be cared for and discharged in the interim  Sometime soon or anytime in the next few months would be happy to see him in person in the interim I will continue his antibiotics and have him see me in 2 months time.  I spent greater than 25 minutes with the patient including greater than 50% of time in face to face counsel of the patient and his caregiver regarding the nature of his current infection risk-benefit calculations regarding his right osteoarthritis total hip arthroplasty and timing of surgery in relation to COVID-19 and coordination of his care

## 2019-03-04 LAB — CBC WITH DIFFERENTIAL/PLATELET
Absolute Monocytes: 628 cells/uL (ref 200–950)
Basophils Absolute: 51 cells/uL (ref 0–200)
Basophils Relative: 0.7 %
Eosinophils Absolute: 438 cells/uL (ref 15–500)
Eosinophils Relative: 6 %
HCT: 34.8 % — ABNORMAL LOW (ref 38.5–50.0)
Hemoglobin: 11.8 g/dL — ABNORMAL LOW (ref 13.2–17.1)
Lymphs Abs: 1913 cells/uL (ref 850–3900)
MCH: 28.8 pg (ref 27.0–33.0)
MCHC: 33.9 g/dL (ref 32.0–36.0)
MCV: 84.9 fL (ref 80.0–100.0)
MPV: 10.9 fL (ref 7.5–12.5)
Monocytes Relative: 8.6 %
Neutro Abs: 4271 cells/uL (ref 1500–7800)
Neutrophils Relative %: 58.5 %
Platelets: 295 10*3/uL (ref 140–400)
RBC: 4.1 10*6/uL — ABNORMAL LOW (ref 4.20–5.80)
RDW: 11.9 % (ref 11.0–15.0)
Total Lymphocyte: 26.2 %
WBC: 7.3 10*3/uL (ref 3.8–10.8)

## 2019-03-04 LAB — BASIC METABOLIC PANEL WITH GFR
BUN/Creatinine Ratio: 21 (calc) (ref 6–22)
BUN: 26 mg/dL — ABNORMAL HIGH (ref 7–25)
CO2: 27 mmol/L (ref 20–32)
Calcium: 9.5 mg/dL (ref 8.6–10.3)
Chloride: 103 mmol/L (ref 98–110)
Creat: 1.24 mg/dL — ABNORMAL HIGH (ref 0.70–1.11)
GFR, Est African American: 62 mL/min/{1.73_m2} (ref >=60)
GFR, Est Non African American: 53 mL/min/{1.73_m2} — ABNORMAL LOW (ref >=60)
Glucose, Bld: 102 mg/dL — ABNORMAL HIGH (ref 65–99)
Potassium: 5.1 mmol/L (ref 3.5–5.3)
Sodium: 138 mmol/L (ref 135–146)

## 2019-03-04 LAB — C-REACTIVE PROTEIN: CRP: 2.7 mg/L (ref <8.0)

## 2019-03-04 LAB — SEDIMENTATION RATE: Sed Rate: 29 mm/h — ABNORMAL HIGH (ref 0–20)

## 2019-03-18 ENCOUNTER — Encounter: Payer: Self-pay | Admitting: Orthopaedic Surgery

## 2019-03-18 ENCOUNTER — Ambulatory Visit (INDEPENDENT_AMBULATORY_CARE_PROVIDER_SITE_OTHER): Payer: Medicare Other | Admitting: Orthopaedic Surgery

## 2019-03-18 VITALS — Ht 68.0 in | Wt 171.0 lb

## 2019-03-18 DIAGNOSIS — M1611 Unilateral primary osteoarthritis, right hip: Secondary | ICD-10-CM | POA: Diagnosis not present

## 2019-03-18 NOTE — Progress Notes (Signed)
Office Visit Note   Patient: Anthony Dunlap           Date of Birth: [birth year]           MRN: [id number] Visit Date: 03/18/2019              Requested by: [name], [address] [place],  [place] [address] PCP: [name], MD   Assessment & Plan: Visit Diagnoses:  1. Primary osteoarthritis of right hip     Plan: [name] has been cleared by his physicians.  [name] at [facility] infectious disease noted that he had a risk given the pubic symphysis osteomyelitis but that the potential benefit from the hip surgery without weigh those risks.  He also suggested waiting until the Covid virus comes down as he will require probably a prolonged hospitalization and possibly rehab.  I have related all of the above to [name] and his friend who accompanies him and will wait till he has been reevaluated by [name] in March to even schedule the surgery all questions were answered.  Office visit over 30 minutes 50% of the time in counseling  Follow-Up Instructions: Return in about 6 weeks (around 04/29/2019).   Orders:  No orders of the defined types were placed in this encounter.  No orders of the defined types were placed in this encounter.     Procedures: No procedures performed   Clinical Data: No additional findings.   Subjective: Chief Complaint  Patient presents with  . Right Hip - Follow-up  Patient presents today for follow up on his right hip. After his last visit he took 3 clearance forms to doctors to prepare for upcoming surgery. He states that his pain has not changed since his last visit. He takes Meloxicam daily. He walks with a walker.  Has history of smoldering osteomyelitis of the pubic symphysis for which he takes amoxicillin through the infectious disease department at [facility].  Also has history of diabetes with diabetic neuropathy in both hands and feet.  He is on amitriptyline.  Uses a walker for ambulation related to his right  hip  HPI  Review of Systems   Objective: Vital Signs: Ht 5\' 8"  (1.727 m)   Wt 171 lb (77.6 kg)   BMI 26.00 kg/m   Physical Exam Constitutional:      Appearance: He is well-developed.  Eyes:     Pupils: Pupils are equal, round, and reactive to light.  Pulmonary:     Effort: Pulmonary effort is normal.  Skin:    General: Skin is warm and dry.  Neurological:     Mental Status: He is alert and oriented to person, place, and time.  Psychiatric:        Behavior: Behavior normal.     Ortho Exam awake alert and oriented x3.  No distress.  Limited range of motion right hip with only about 10 degrees of external rotation and minimal internal rotation does walk with his right lower extremity externally rotated.  Does have some tingling in his feet but both feet were warm.  Specialty Comments:  No specialty comments available.  Imaging: No results found.   PMFS History: Patient Active Problem List   Diagnosis Date Noted  . Osteoarthritis of right hip 01/05/2019  . Pelvic abscess in male [facility]) 09/11/2018  . Drug-induced hepatitis 06/05/2018  . Calculus of gallbladder without cholecystitis without obstruction   . Fungemia/Candida albicans 05/10/2018  . UTI (urinary tract infection) 05/08/2018  .  Complicated UTI (urinary tract infection) 05/08/2018  . 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 Abscess of left thigh   . Pressure injury of skin 02/10/2018  . Enterococcus faecalis infection 01/27/2018  . Abscess   . 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 Sepsis due to Bacteroides species ([place]) 01/22/2018  . Sepsis without acute organ dysfunction ([place])   . DKA (diabetic ketoacidoses) ([place]) 01/17/2018  . Normocytic anemia 01/17/2018  . Chronic anemia 01/03/2018  . Chronic indwelling Foley catheter 01/03/2018  . Elevated alkaline phosphatase level 01/03/2018  . Hypothyroidism 01/03/2018  . Physical deconditioning 01/03/2018  . 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 HLD (hyperlipidemia) 08/19/2013  . Type 2 diabetes mellitus ([place])   . Prostate cancer [facility])    Past Medical History:  Diagnosis Date  . 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 Diabetes ([place])   . Drug-induced hepatitis 06/05/2018  . Left eye pain   . Localized osteoarthrosis of right hip 01/05/2019  . Prostate cancer [facility])     Family History  Problem Relation Age of Onset  . Pneumonia Father   . Cancer Sister     Past Surgical History:  Procedure Laterality Date  . HERNIA REPAIR    . IR RADIOLOGIST EVAL & MGMT  03/06/2018  . PROSTATE SURGERY     Social History   Occupational History    Employer: RETIRED    Comment: Retired  Tobacco Use  . Smoking status: Never Smoker  . Smokeless tobacco: Never Used  Substance and Sexual Activity  . Alcohol use: No  . Drug use: No  . Sexual activity: Not on file

## 2019-03-19 ENCOUNTER — Telehealth: Payer: Self-pay | Admitting: Orthopaedic Surgery

## 2019-03-19 NOTE — Telephone Encounter (Signed)
Please call caregiver

## 2019-03-19 NOTE — Telephone Encounter (Signed)
Patient's "caregiver"  Gust Brooms [phone] would like to speak with Dr Durward Fortes regarding patient's surgery being pushed back.  I had called patient to offer Feb 9th prior to knowing [name] wanted to delay the surgery.

## 2019-03-19 NOTE — Telephone Encounter (Signed)
called

## 2019-04-08 ENCOUNTER — Ambulatory Visit: Payer: Medicare Other | Admitting: Infectious Disease

## 2019-04-15 ENCOUNTER — Ambulatory Visit: Payer: Medicare Other | Admitting: Infectious Disease

## 2019-05-04 ENCOUNTER — Ambulatory Visit: Payer: Medicare Other | Admitting: Infectious Disease

## 2019-05-04 ENCOUNTER — Other Ambulatory Visit: Payer: Self-pay

## 2019-05-04 ENCOUNTER — Encounter: Payer: Self-pay | Admitting: Infectious Disease

## 2019-05-04 VITALS — Wt 176.0 lb

## 2019-05-04 DIAGNOSIS — B952 Enterococcus as the cause of diseases classified elsewhere: Secondary | ICD-10-CM

## 2019-05-04 DIAGNOSIS — M86659 Other chronic osteomyelitis, unspecified thigh: Secondary | ICD-10-CM | POA: Diagnosis not present

## 2019-05-04 DIAGNOSIS — M1611 Unilateral primary osteoarthritis, right hip: Secondary | ICD-10-CM | POA: Diagnosis not present

## 2019-05-04 DIAGNOSIS — A414 Sepsis due to anaerobes: Secondary | ICD-10-CM

## 2019-05-04 NOTE — Progress Notes (Signed)
Subjective:    Patient ID: Anthony Dunlap, male    DOB: [birth year], 86 y.o.   MRN: [id number]  Chief Complaint:  Right hip pain, with some groin pain as well.   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 He is currently residing in a skilled nursing facility and on Zosyn rather than Unasyn. I do not know why this change was made as his microbes were covered by the Unasyn. In any case the abscesses have resolved radiographically and the drains been pulled by interventional radiology.  He did well since I last saw him in March 2020, but then was admitted to the hospital with severe candidal infection with intertrigo, and diarrhea with the catheter in place and candidemia.  He was treated with fluconazole but then developed hepatitis and was changed over to Eraxis and completed a two-week course.  He was not formally seen by an ophthalmologist in the hospital nor afterwards in the clinic.  He deniedhaving any visual changes other than occasionally having itching eyes.  He continued sheltering in place at his home with his wife, son( who also got on the phone call with dad) and only being visited by 2 other people who he employs to help take care of himself and his wife.   when I conducted a telephone visit with him on June 15 of 2020 he was experiencing worsening right-sided hip pain with ambulation and with times different positions in his bed.  I saw him in person in late June and his inflammatory markers were elevated I then ordered an MRI on July 16th showed:  IMPRESSION: Osteomyelitis about the symphysis pubis demonstrates  some improvement, particularly in the superior pubic rami. There has been only minimal improvement in signal abnormality in the inferior pubic rami.  Tiny fluid collection in the symphysis pubis is new since the prior examination worrisome for abscess.  He had no change in severe right and moderately severe left hip osteoarthritis.  I I arranged for him to be admitted to the hospital directly from clinic last visit.  Interventional radiology able to aspirate the pubic symphysis with 1 mL of bloody fluid being recovered for culture but no organisms grew.  He is placed back on intravenous Unasyn.  Unfortunately taking the Unasyn at home became too burdensome for the son because of it being multiple times per day.  He was  switched over to IV daptomycin x 2 weeks then with addition of ceftriaxone 2 g daily and metronidazole 500 mg 3 times daily to cover gram negatives and anaerobes.  Since then he was switched over to Augmentin and continued on that.  He continues to have some pain in his pubic region but also more so in the right hip where he has severe osteoarthritis.  He has not had severe yeast infections while using the topical nystatin powder.  After he last saw him we got an MRI of the hip and pelvis in December 2020 which showed:   IMPRESSION: 1. Overall improved appearance of the pubic bone marrow edema and enhancement. No evidence of progressive osteomyelitis. 2. Stable small peripherally enhancing fluid collection at the symphysis pubis. 3. No  new or enlarging fluid collections. The soft tissue inflammatory changes in the inferior pelvis have improved. 4. Stable severe right hip osteoarthritis.   He has been seen by Joni Fears with orthopedic surgery who completely destroyed.  Patient was here for "preoperative clearance "from infectious disease standpoint at his last visit..  I have of course had  apprehension about infection and morbidity of surgery in  general in this gentleman.  I hadspecific anxiety about the infection and the symphysis pubis potentially spreading to newly implanted prosthetic hip.  That being said the infection does seem confined to the symphysis pubis there is not appear to be any osteomyelitis at the site of his severe osteoarthritic changes, and if we keep him on antibiotics it would seem low risk of that infection spreading to the total hip arthroplasty.  At his last visit we recommended caution in particular in light of the Covid pandemic experiencing a Psychologist, sport and exercise.  We now appear to be on a downward trend in this regard to COVID-19 pandemic absence of another search in the context of loosening of restrictions and emergence of variants.  He himself now however has been fully vaccinated with mRNA vaccine x2.  He has been followed closely by orthopedic surgery and still is interested in having his hip replacement surgery performed   Past Medical History:  Diagnosis Date  . 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 Diabetes ([place])   . Drug-induced hepatitis 06/05/2018  . Left eye pain   . Localized osteoarthrosis of right hip 01/05/2019  . Prostate cancer [facility])     Past Surgical History:  Procedure Laterality Date  . HERNIA REPAIR    . IR RADIOLOGIST EVAL & MGMT  03/06/2018  . PROSTATE SURGERY      Family History  Problem Relation Age of Onset  . Pneumonia Father   . Cancer Sister       Social History   Socioeconomic History  . Marital status: Married    Spouse name: [name]  . Number of children: 3  . Years of education: 9th  . Highest education level: Not on file  Occupational History    Employer: RETIRED    Comment: Retired  Tobacco Use  . Smoking status: Never Smoker  . Smokeless tobacco: Never Used  Substance and Sexual Activity  . Alcohol use: No  . Drug use: No  . Sexual activity: Not on file  Other Topics Concern  . Not on file  Social History  Narrative   Patient lives at home with his wife. [name]) . Patient is retired.   Education 9th grade.   Right handed.   Caffeine None   Social Determinants of Health   Financial Resource Strain:   . Difficulty of Paying Living Expenses:   Food Insecurity:   . Worried About Charity fundraiser in the Last Year:   . Arboriculturist in the Last Year:   Transportation Needs:   . Film/video editor (Medical):   [name] Lack of Transportation (Non-Medical):   Physical Activity:   . Days of Exercise per Week:   . Minutes of Exercise per Session:   Stress:   . Feeling of Stress :   Social Connections:   . Frequency of Communication with Friends and Family:   . Frequency of Social Gatherings with Friends and Family:   . Attends Religious Services:   . Active Member of Clubs or Organizations:   . Attends Archivist  Meetings:   [name] Marital Status:     Allergies  Allergen Reactions  . Fluconazole Other (See Comments)    Drug-induced hepatitis     Current Outpatient Medications:  .  acetaminophen (TYLENOL) 325 MG tablet, Take 2 tablets (650 mg total) by mouth every 6 (six) hours as needed for mild pain, fever or headache., Disp: , Rfl:  .  amitriptyline (ELAVIL) 10 MG tablet, Take 10 mg by mouth at bedtime. , Disp: , Rfl:  .  amoxicillin-clavulanate (AUGMENTIN) 875-125 MG tablet, Take 1 tablet by mouth 2 (two) times daily., Disp: 60 tablet, Rfl: 11 .  aspirin EC 81 MG tablet, Take 81 mg by mouth at bedtime. , Disp: , Rfl:  .  ferrous sulfate 325 (65 FE) MG tablet, Take 1 tablet (325 mg total) by mouth daily with breakfast. (Patient taking differently: Take 325 mg by mouth every evening. ), Disp: , Rfl: 3 .  furosemide (LASIX) 20 MG tablet, Take 20 mg by mouth every morning. , Disp: , Rfl:  .  glimepiride (AMARYL) 4 MG tablet, Take 4 mg by mouth every morning. , Disp: , Rfl:  .  insulin aspart (NOVOLOG) 100 UNIT/ML injection, Inject 0-15 Units into the skin 3 (three) times  daily with meals. Sliding scale insulin Less than 70 initiate hypoglycemia protocol 70-120  0 units 120-150 2 unit 151-200 3 units 201-250 3 units 251-300 5 units 301-350 8 units 351-400 11 units  Greater than 400 15 units , call MD (Patient taking differently: Inject 0-15 Units into the skin See admin instructions. Sliding scale insulin as needed for high blood sugars Less than 70 initiate hypoglycemia protocol 70-120  1 units 120-150 3 unit 151-200 4 units 201-250 5 units 251-300 6 units 301-350 9units 351-400 12 units  Greater than 400 15 units , call MD), Disp: 10 mL, Rfl: 11 .  levothyroxine (SYNTHROID, LEVOTHROID) 100 MCG tablet, Take 100 mcg by mouth every morning. , Disp: , Rfl:  .  meloxicam (MOBIC) 7.5 MG tablet, Take 7.5 mg by mouth 2 (two) times daily. , Disp: , Rfl:  .  nystatin (MYCOSTATIN/NYSTOP) powder, Apply topically 4 (four) times daily., Disp: 60 g, Rfl: 8 .  pravastatin (PRAVACHOL) 10 MG tablet, Take 10 mg by mouth every evening. , Disp: , Rfl:  .  primidone (MYSOLINE) 50 MG tablet, Take 100 mg by mouth 2 (two) times daily. , Disp: , Rfl:  .  SOLIQUA 100-33 UNT-MCG/ML SOPN, Inject 44 Units as directed every morning. , Disp: , Rfl:    Review of Systems  Constitutional: Negative for activity change, appetite change, chills, diaphoresis, fatigue, fever and unexpected weight change.  HENT: Negative for congestion, dental problem, postnasal drip, rhinorrhea, sinus pressure, sneezing, sore throat and trouble swallowing.   Eyes: Negative for photophobia and visual disturbance.  Respiratory: Negative for cough, chest tightness, shortness of breath, wheezing and stridor.   Cardiovascular: Negative for chest pain, palpitations and leg swelling.  Gastrointestinal: Negative for abdominal distention, abdominal pain, anal bleeding, blood in stool, constipation, diarrhea, nausea and vomiting.  Genitourinary: Negative for difficulty urinating, dysuria, flank pain and hematuria.    Musculoskeletal: Positive for arthralgias, gait problem and myalgias. Negative for back pain and joint swelling.  Skin: Negative for color change, pallor, rash and wound.  Neurological: Negative for dizziness, tremors, weakness and light-headedness.  Hematological: Negative for adenopathy. Does not bruise/bleed easily.  Psychiatric/Behavioral: Negative for agitation, behavioral problems, confusion, decreased concentration, dysphoric mood and sleep disturbance.  Objective:   Physical Exam Constitutional:      General: He is not in acute distress.    Appearance: Normal appearance. He is well-developed. He is not ill-appearing or diaphoretic.  HENT:     Head: Normocephalic and atraumatic.     Right Ear: Hearing and external ear normal.     Left Ear: Hearing and external ear normal.     Nose: No nasal deformity or rhinorrhea.  Eyes:     General: No scleral icterus.    Conjunctiva/sclera: Conjunctivae normal.     Right eye: Right conjunctiva is not injected.     Left eye: Left conjunctiva is not injected.  Neck:     Vascular: No JVD.  Cardiovascular:     Rate and Rhythm: Regular rhythm. Tachycardia present.     Heart sounds: S1 normal and S2 normal.  Pulmonary:     Effort: Pulmonary effort is normal. No respiratory distress.     Breath sounds: No wheezing.  Abdominal:     General: There is no distension.     Palpations: Abdomen is soft.  Musculoskeletal:     Right shoulder: Normal.     Left shoulder: Normal.     Cervical back: Normal range of motion and neck supple.     Right hip: Decreased range of motion.     Left hip: Decreased range of motion.     Right knee: Normal.     Left knee: Normal.  Lymphadenopathy:     Head:     Right side of head: No submandibular, preauricular or posterior auricular adenopathy.     Left side of head: No submandibular, preauricular or posterior auricular adenopathy.     Cervical: No cervical adenopathy.     Right cervical: No superficial  or deep cervical adenopathy.    Left cervical: No superficial or deep cervical adenopathy.  Skin:    General: Skin is warm and dry.     Coloration: Skin is not pale.     Findings: No abrasion, bruising, ecchymosis, erythema, lesion or rash.     Nails: There is no clubbing.  Neurological:     General: No focal deficit present.     Mental Status: He is alert and oriented to person, place, and time. Mental status is at baseline.     Sensory: No sensory deficit.     Coordination: Coordination normal.     Gait: Gait normal.  Psychiatric:        Attention and Perception: He is attentive.        Mood and Affect: Mood normal.        Speech: Speech is delayed.        Behavior: Behavior normal. Behavior is cooperative.        Cognition and Memory: Cognition normal.        Judgment: Judgment normal.         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  Right hip severe osteoarthritis:   It seems to me that his right hip osteoarthritis and pain from this is significantly pitting his quality of life.  If a right total hip arthroplasty could significantly ameliorate this and it sounds like it could then the  benefit of this to me would seem to outweigh the risk of infection from the surgery itself fourth of infection spreading from the symphysis pubis.  I think if he and his surgeon want to proceed with surgery I would be okay with that but certainly I cautioned her no guarantees I could but certainly could not guarantee that he would not have spread of infection or new infection and certainly all medical and surgical interventions, with risks.

## 2019-05-05 LAB — BASIC METABOLIC PANEL WITH GFR
BUN/Creatinine Ratio: 24 (calc) — ABNORMAL HIGH (ref 6–22)
BUN: 27 mg/dL — ABNORMAL HIGH (ref 7–25)
CO2: 28 mmol/L (ref 20–32)
Calcium: 9.2 mg/dL (ref 8.6–10.3)
Chloride: 99 mmol/L (ref 98–110)
Creat: 1.13 mg/dL — ABNORMAL HIGH (ref 0.70–1.11)
GFR, Est African American: 69 mL/min/{1.73_m2} (ref >=60)
GFR, Est Non African American: 59 mL/min/{1.73_m2} — ABNORMAL LOW (ref >=60)
Glucose, Bld: 318 mg/dL — ABNORMAL HIGH (ref 65–99)
Potassium: 4.9 mmol/L (ref 3.5–5.3)
Sodium: 134 mmol/L — ABNORMAL LOW (ref 135–146)

## 2019-05-05 LAB — SEDIMENTATION RATE: Sed Rate: 25 mm/h — ABNORMAL HIGH (ref 0–20)

## 2019-05-05 LAB — C-REACTIVE PROTEIN: CRP: 2 mg/L (ref <8.0)

## 2019-05-13 ENCOUNTER — Encounter: Payer: Self-pay | Admitting: Orthopaedic Surgery

## 2019-05-13 ENCOUNTER — Ambulatory Visit (INDEPENDENT_AMBULATORY_CARE_PROVIDER_SITE_OTHER): Payer: Medicare Other | Admitting: Orthopaedic Surgery

## 2019-05-13 VITALS — Ht 68.0 in | Wt 176.0 lb

## 2019-05-13 DIAGNOSIS — M1611 Unilateral primary osteoarthritis, right hip: Secondary | ICD-10-CM | POA: Diagnosis not present

## 2019-05-13 NOTE — Progress Notes (Signed)
Office Visit Note   Patient: Anthony Dunlap           Date of Birth: 1933/12/23           MRN: [id number] Visit Date: 05/13/2019              Requested by: [name], [place],  [place] [id number] PCP: [name], MD   Assessment & Plan: Visit Diagnoses:  1. Primary osteoarthritis of right hip     Plan: [name] is accompanied by his friend and here for follow-up evaluation of the osteoarthritis of his right hip.  He recently saw [name] at the infectious disease clinic at [facility] and has been cleared to proceed with hip replacement.  He also has been cleared by his urologist and his primary care physician, Dr. Herminio Heads has end-stage osteoarthritis of his right hip with loss of the joint space and some collapse of the femoral head.  He might have an element of avascular necrosis.  He also has chronic osteomyelitis of the pubic symphysis and has been followed by [name].  He is presently taking 875 mg of Augmentin on a daily basis.  Been waiting for the Covid pandemic to calm down and to get further word from [name] before proceeding.  We discussed his present situation and the surgery over 30 minutes today.  I think he might be better off with an anterior approach.  We will ask Dr. Lorin Mercy to evaluate.  He also, most likely, will need rehab after surgery.  They have discussed coming here to [facility].  He does have some difficulty urinating and has to be catheterized at least once a month for any residual urine he does use a walker.  He does not have any cardiovascular issues.  Follow-Up Instructions: Return Will consult Dr. Lorin Mercy for anterior approach right total hip replacement.   Orders:  No orders of the defined types were placed in this encounter.  No orders of the defined types were placed in this encounter.     Procedures: No procedures performed   Clinical Data: No additional findings.   Subjective: Chief Complaint    Patient presents with  . Right Hip - Follow-up, Pain  Patient presents today for a two month follow up on his right hip arthritis. Since his last visit he has received both covid vaccines and saw Dr.Vandam again. He states that his hip pain has worsened since his last visit.   HPI  Review of Systems   Objective: Vital Signs: Ht 5\' 8"  (1.727 m)   Wt 176 lb (79.8 kg)   BMI 26.76 kg/m   Physical Exam Constitutional:      Appearance: He is well-developed.  Eyes:     Pupils: Pupils are equal, round, and reactive to light.  Pulmonary:     Effort: Pulmonary effort is normal.  Skin:    General: Skin is warm and dry.  Neurological:     Mental Status: He is alert and oriented to person, place, and time.  Psychiatric:        Behavior: Behavior normal.     Ortho Exam awake alert and oriented x3.  Comfortable sitting.  Has little if any movement in his right hip from a position about 10 degrees externally rotated.  Does use a walker for ambulation.  No local tenderness about the pubis.  Motor exam appears to be intact.  No percussible tenderness of his lumbar spine.  Specialty  Comments:  No specialty comments available.  Imaging: No results found.   PMFS History: Patient Active Problem List   Diagnosis Date Noted  . Osteoarthritis of right hip 01/05/2019  . Pelvic abscess in male [facility]) 09/11/2018  . Drug-induced hepatitis 06/05/2018  . Calculus of gallbladder without cholecystitis without obstruction   . Fungemia/Candida albicans 05/10/2018  . UTI (urinary tract infection) 05/08/2018  . Complicated UTI (urinary tract infection) 05/08/2018  . 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 Abscess of left thigh   . Pressure injury of skin 02/10/2018  . Enterococcus faecalis infection 01/27/2018  . Abscess   . 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 Sepsis due to Bacteroides species ([place]) 01/22/2018  . Sepsis without acute organ dysfunction ([place])   .  DKA (diabetic ketoacidoses) ([place]) 01/17/2018  . Normocytic anemia 01/17/2018  . Chronic anemia 01/03/2018  . Chronic indwelling Foley catheter 01/03/2018  . Elevated alkaline phosphatase level 01/03/2018  . Hypothyroidism 01/03/2018  . Physical deconditioning 01/03/2018  . 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 HLD (hyperlipidemia) 08/19/2013  . Type 2 diabetes mellitus ([place])   . Prostate cancer [facility])    Past Medical History:  Diagnosis Date  . 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 Diabetes ([place])   . Drug-induced hepatitis 06/05/2018  . Left eye pain   . Localized osteoarthrosis of right hip 01/05/2019  . Prostate cancer [facility])     Family History  Problem Relation Age of Onset  . Pneumonia Father   . Cancer Sister     Past Surgical History:  Procedure Laterality Date  . HERNIA REPAIR    . IR RADIOLOGIST EVAL & MGMT  03/06/2018  . PROSTATE SURGERY     Social History   Occupational History    Employer: RETIRED    Comment: Retired  Tobacco Use  . Smoking status: Never Smoker  . Smokeless tobacco: Never Used  Substance and Sexual Activity  . Alcohol use: No  . Drug use: No  . Sexual activity: Not on file

## 2019-05-14 ENCOUNTER — Encounter: Payer: Self-pay | Admitting: Orthopaedic Surgery

## 2019-05-14 ENCOUNTER — Ambulatory Visit: Payer: Medicare Other | Admitting: Orthopaedic Surgery

## 2019-05-14 VITALS — BP 91/66 | HR 78 | Ht 68.0 in | Wt 176.0 lb

## 2019-05-14 DIAGNOSIS — M86659 Other chronic osteomyelitis, unspecified thigh: Secondary | ICD-10-CM

## 2019-05-14 DIAGNOSIS — M1611 Unilateral primary osteoarthritis, right hip: Secondary | ICD-10-CM | POA: Diagnosis not present

## 2019-05-14 DIAGNOSIS — Z978 Presence of other specified devices: Secondary | ICD-10-CM | POA: Diagnosis not present

## 2019-05-14 DIAGNOSIS — Z794 Long term (current) use of insulin: Secondary | ICD-10-CM

## 2019-05-14 DIAGNOSIS — E1165 Type 2 diabetes mellitus with hyperglycemia: Secondary | ICD-10-CM

## 2019-05-14 NOTE — Progress Notes (Signed)
Office Visit Note   Patient: Anthony Dunlap           Date of Birth: 11-28-1933           MRN: [id number] Visit Date: 05/14/2019              Requested by: [name], [address] [place],  [place] [address] PCP: [name], MD   Assessment & Plan: Visit Diagnoses:  1. Primary osteoarthritis of right hip   2. 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  This is been felt acceptable risk by infectious disease team as well as Dr. Durward Fortes.  He has had previous prostate surgery with radioactive seeds later had symphysis pubis osteomyelitis but this is stable.  Plan total of arthroplasty right hip direct anterior approach.  With all his medical problems he likely would be in the hospital for 2 nights.  Plain radiograph shows bone-on-bone changes marginal osteophyte subchondral sclerosis shortening and flattening of the head.  Risk of surgery discussed including deep abscess osteomyelitis of the hip possible Girdlestone procedure with removal hip prosthesis.  Questions elicited and answered.  He requests we proceed.  Follow-Up Instructions: No follow-ups on file.   Orders:  No orders of the defined types were placed in this encounter.  No orders of the defined types were placed in this encounter.     Procedures: No procedures performed   Clinical Data: No additional findings.   Subjective: Chief Complaint  Patient presents with  . Right Hip - Pain    HPI 86 year old male referred by Dr. Durward Fortes to me for an anterior total hip arthroplasty on the right.  He has bone-on-bone erosive changes and had past history of problems with prostate cancer has chronic Foley catheter placement and has had chronic osteomyelitis of the pubic symphysis and currently is on Augmentin 875 p.o. daily.  He had been seen by infectious disease who felt it  was reasonable to consider proceeding with total hip arthroplasty with [name].  Patient is here with caregiver.  Patient's daughter is Anthony Dunlap her phone number is [phone] which is contact number.  Patient is amatory with a walker.  He has severe pain with range of motion of his hip but always wants to be able to get up on his own.  Last sedimentation rate was 25, C-reactive protein normal at 2.0, white blood count 7.3 thousand with normal differential and these labs from 05/04/2019.  Previous urine cultures in the past last year showed multiple species.  Patient's had history of yeast infections in the past.  He is used some topical antifungal and cannot be on any Azole due to cost hepatitis.  Cultures from his pelvis were polymicrobial and currently is on Augmentin 875 twice daily which is been taken for several months and will need to be used for several months postop.  Patient is amatory with a limp he has severe pain when he turns twists his right hip.  Review of Systems positive for primary osteoarthritis of the hip.  Documented by both MRI and CT scan.  Chronic osteomyelitis of the pelvis pubic region with multiple organisms.  Chronic indwelling Foley catheter.  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  History of he agrees lipids hypothyroidism.  No problems with left hip osteoarthritis.   Objective: Vital Signs: BP 91/66   Pulse 78   Ht 5\' 8"  (1.727 m)  Wt 176 lb (79.8 kg)   BMI 26.76 kg/m   Physical Exam Constitutional:      Appearance: He is well-developed.  HENT:     Head: Normocephalic and atraumatic.  Eyes:     Pupils: Pupils are equal, round, and reactive to light.  Neck:     Thyroid: No thyromegaly.     Trachea: No tracheal deviation.  Cardiovascular:     Rate and Rhythm: Normal rate.  Pulmonary:     Effort: Pulmonary effort is normal.     Breath sounds: No wheezing.  Abdominal:     General: Bowel sounds are normal.     Palpations: Abdomen is soft.  Skin:     General: Skin is warm and dry.     Capillary Refill: Capillary refill takes less than 2 seconds.  Neurological:     Mental Status: He is alert and oriented to person, place, and time.  Psychiatric:        Behavior: Behavior normal.        Thought Content: Thought content normal.        Judgment: Judgment normal.     Ortho Exam patient has quite a chronic indwelling Foley urine is clear.  There is some right inguinal erythema where he applies cream.  Skin over the ASIS and anterior aspect of the trochanter is clear.  He has half inch shortening my clinical exam on the right side.  Distal pulses palpable.  No inguinal lymphadenopathy.  0 degrees internal and external rotation right hip.  He cannot extend past neutral position.  He can flex just to 90.  Specialty Comments:  No specialty comments available.  Imaging: CLINICAL DATA:  Chronic osteomyelitis.  EXAM: MRI PELVIS WITHOUT AND WITH CONTRAST  TECHNIQUE: Multiplanar multisequence MR imaging of the pelvis was performed both before and after administration of intravenous contrast.  CONTRAST:  71mL MULTIHANCE GADOBENATE DIMEGLUMINE 529 MG/ML IV SOLN  COMPARISON:  MRI pelvis 09/05/2018. CT pelvis 09/12/2018.  FINDINGS: Urinary Tract: Foley catheter is in place. The bladder is decompressed. There is mild diffuse bladder wall thickening. No significant dilatation of the visualized distal ureters.  Bowel: No bowel wall thickening, distention or surrounding inflammation identified within the pelvis. Moderate stool in the colon.  Vascular/Lymphatic: No enlarged pelvic lymph nodes identified. Stable small inguinal lymph nodes bilaterally, likely reactive. No acute vascular findings.  Reproductive: There are brachytherapy seeds throughout the prostate gland.  Other: Inflammatory changes in the inferior pelvis have improved compared with the prior study. There are no new or enlarging fluid collections.   Musculoskeletal: Chronic widening of the symphysis pubis and deformity of the pubic rami are unchanged. There is no progressive bone destruction. Marrow edema and enhancement within the pubic bones have improved. Small peripherally enhancing fluid collection at the symphysis pubis appears similar, measuring 2.6 x 1.5 cm transverse on image 36/10. The surrounding soft tissue edema and enhancement have improved. Severe right hip osteoarthritis again noted with joint space narrowing, osteophytes and subchondral cyst formation. There are stable mild left hip degenerative changes. Enthesophytes noted at the common hamstring tendons bilaterally.  IMPRESSION: 1. Overall improved appearance of the pubic bone marrow edema and enhancement. No evidence of progressive osteomyelitis. 2. Stable small peripherally enhancing fluid collection at the symphysis pubis. 3. No new or enlarging fluid collections. The soft tissue inflammatory changes in the inferior pelvis have improved. 4. Stable severe right hip osteoarthritis.   Electronically Signed   By: Richardean Sale M.D.   On: 01/26/2019  09:68  Study Result  CLINICAL DATA:  Osteomyelitis suspected  EXAM: CT PELVIS WITH CONTRAST  TECHNIQUE: Multidetector CT imaging of the pelvis was performed using the standard protocol following the bolus administration of intravenous contrast.  CONTRAST:  133mL ISOVUE-300 IOPAMIDOL (ISOVUE-300) INJECTION 61%  COMPARISON:  MRI 09/05/2018  FINDINGS: Urinary Tract: Foley catheter present in the bladder which is decompressed. Ureters are decompressed. Small cysts in the midpole of both kidneys. No hydronephrosis.  Bowel: Moderate stool burden throughout the colon. Normal appendix. Visualized small bowel decompressed, grossly unremarkable.  Vascular/Lymphatic: Aortoiliac atherosclerosis. No aneurysm or adenopathy.  Reproductive:  Radiation seeds in the region of the prostate.  Other:   No free fluid or free air.  Musculoskeletal: Changes of osteomyelitis noted about the pubic symphysis, extending into the superior and inferior pubic rami. There is widening of the pubic symphysis with soft tissue extending anteriorly. This could reflect septic arthritis or abscess.  IMPRESSION: Again noted are changes of osteomyelitis within the pubic bones, adjacent inferior and superior pubic rami bilaterally, stable since recent MRI.  Widening and irregularity of the pubic symphysis with soft tissue noted anteriorly. This could be related to septic arthritis or abscess. Again, findings similar to prior MRI.  Aortoiliac atherosclerosis.  Moderate stool burden throughout the colon.   Electronically Signed   By: Rolm Baptise M.D.   On: 09/12/2018 09:50     PMFS History: Patient Active Problem List   Diagnosis Date Noted  . Osteoarthritis of right hip 01/05/2019  . Pelvic abscess in male [facility]) 09/11/2018  . Drug-induced hepatitis 06/05/2018  . Calculus of gallbladder without cholecystitis without obstruction   . Fungemia/Candida albicans 05/10/2018  . UTI (urinary tract infection) 05/08/2018  . Complicated UTI (urinary tract infection) 05/08/2018  . 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 Abscess of left thigh   . Pressure injury of skin 02/10/2018  . Enterococcus faecalis infection 01/27/2018  . Abscess   . 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 Sepsis due to Bacteroides species ([place]) 01/22/2018  . Sepsis without acute organ dysfunction ([place])   . DKA (diabetic ketoacidoses) ([place]) 01/17/2018  . Normocytic anemia 01/17/2018  . Chronic anemia 01/03/2018  . Chronic indwelling Foley catheter 01/03/2018  . Elevated alkaline phosphatase level 01/03/2018  . Hypothyroidism 01/03/2018  . Physical deconditioning 01/03/2018  . 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 HLD (hyperlipidemia) 08/19/2013  . Type 2 diabetes mellitus ([place])    . Prostate cancer [facility])    Past Medical History:  Diagnosis Date  . 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 Diabetes ([place])   . Drug-induced hepatitis 06/05/2018  . Left eye pain   . Localized osteoarthrosis of right hip 01/05/2019  . Prostate cancer [facility])     Family History  Problem Relation Age of Onset  . Pneumonia Father   . Cancer Sister     Past Surgical History:  Procedure Laterality Date  . HERNIA REPAIR    . IR RADIOLOGIST EVAL & MGMT  03/06/2018  . PROSTATE SURGERY     Social History   Occupational History    Employer: RETIRED    Comment: Retired  Tobacco Use  . Smoking status: Never Smoker  . Smokeless tobacco: Never Used  Substance and Sexual Activity  . Alcohol use: No  . Drug use: No  . Sexual activity: Not on file

## 2019-05-25 ENCOUNTER — Other Ambulatory Visit: Payer: Self-pay

## 2019-05-28 ENCOUNTER — Other Ambulatory Visit: Payer: Self-pay

## 2019-05-28 ENCOUNTER — Encounter: Payer: Self-pay | Admitting: Surgery

## 2019-05-28 ENCOUNTER — Ambulatory Visit: Payer: Medicare Other | Admitting: Surgery

## 2019-05-28 VITALS — BP 161/98 | HR 115 | Temp 96.8°F

## 2019-05-28 DIAGNOSIS — M1611 Unilateral primary osteoarthritis, right hip: Secondary | ICD-10-CM

## 2019-05-28 NOTE — Progress Notes (Signed)
86 year old white male with history of end-stage DJD right hip and pain comes in for preop evaluation.  He continues to have ongoing pain in the set that is unchanged from previous visit.  He is wanting to proceed with right total hip replacement as scheduled.  Today history and physical performed.  Heart exam RRR tachycardic.  Lungs CTA.  We have received preop medical clearance from Dr. [name].  Today review of systems negative.  Patient does use a chronic indwelling Foley catheter.  Surgical procedure discussed along with potential hospital stay.  All questions answered.

## 2019-06-08 NOTE — Progress Notes (Addendum)
[id number] - [place], [place] [address] [place] [address] Phone: [phone] Fax: [phone]    Your procedure is scheduled on Friday, April 23rd  Report to [facility] Main Entrance "A" at 8:00 A.M., and check in at the Admitting office.  Call this number if you have problems the morning of surgery:  [phone]  Call [phone] if you have any questions prior to your surgery date Monday-Friday 8am-4pm   Remember:  Do not eat after midnight the night before your surgery  You may drink clear liquids until 7:00 A.M. the morning of your surgery.   Clear liquids allowed are: Water, Non-Citrus Juices (without pulp), Carbonated Beverages, Clear Tea, Black Coffee Only, and Gatorade   Enhanced Recovery after Surgery for Orthopedics Enhanced Recovery after Surgery is a protocol used to improve the stress on your body and your recovery after surgery.  Patient Instructions  . The night before surgery:  o No food after midnight. ONLY clear liquids after midnight  . The day of surgery (if you have diabetes) o Drink ONE (1) 10oz bottle of water by 7:00 A.M. the morning of surgery. o Nothing else to drink after completing the  10oz bottle of water         If you have questions, please contact your surgeon's office.   Take these medicines the morning of surgery with A SIP OF WATER  levothyroxine (SYNTHROID, LEVOTHROID)  If needed -acetaminophen (TYLENOL), ALPRAZolam (XANAX),    Follow your surgeon's instructions on when to stop Aspirin.  If no instructions were given by your surgeon then you will need to call the office to get those instructions.    As of today, STOP taking any Aspirin containing products, meloxicam (MOBIC), Aleve, Naproxen, Ibuprofen, Motrin, Advil, Goody's, BC's, all herbal medications, fish oil, and all vitamins.  WHAT DO I DO ABOUT MY DIABETES MEDICATION  - The morning of  surgery Do NOT take glimepiride (AMARYL)  Do NOT take [place] 100-33 UNT  If your CBG is greater than 220 mg/dL, you may take  of your sliding scale (correction) dose of insulin.  HOW TO MANAGE YOUR DIABETES BEFORE AND AFTER SURGERY  Why is it important to control my blood sugar before and after surgery? . Improving blood sugar levels before and after surgery helps healing and can limit problems. . A way of improving blood sugar control is eating a healthy diet by: o  Eating less sugar and carbohydrates o  Increasing activity/exercise o  Talking with your doctor about reaching your blood sugar goals . High blood sugars (greater than 180 mg/dL) can raise your risk of infections and slow your recovery, so you will need to focus on controlling your diabetes during the weeks before surgery. . Make sure that the doctor who takes care of your diabetes knows about your planned surgery including the date and location.  How do I manage my blood sugar before surgery? . Check your blood sugar at least 4 times a day, starting 2 days before surgery, to make sure that the level is not too high or low. . Check your blood sugar the morning of your surgery when you wake up and every 2 hours until you get to the Short Stay unit. o If your blood sugar is less than 70 mg/dL, you will need to treat for low blood sugar: - Do not take insulin. - Treat a  low blood sugar (less than 70 mg/dL) with  cup of clear juice (cranberry or apple), 4 glucose tablets, OR glucose gel. - Recheck blood sugar in 15 minutes after treatment (to make sure it is greater than 70 mg/dL). If your blood sugar is not greater than 70 mg/dL on recheck, call [phone] for further instructions. . Report your blood sugar to the short stay nurse when you get to Short Stay.  . If you are admitted to the hospital after surgery: o Your blood sugar will be checked by the staff and you will probably be given insulin after surgery (instead of  oral diabetes medicines) to make sure you have good blood sugar levels. o The goal for blood sugar control after surgery is 80-180 mg/dL.             Do not wear jewelry, make up, or nail polish            Do not wear lotions, powders, perfumes/colognes, or deodorant.            Men may shave face and neck.            Do not bring valuables to the hospital.            [facility] is not responsible for any belongings or valuables.  Do NOT Smoke (Tobacco/Vapping) or drink Alcohol 24 hours prior to your procedure If you use a CPAP at night, you may bring all equipment for your overnight stay.   Contacts, glasses, dentures or bridgework may not be worn into surgery.      For patients admitted to the hospital, discharge time will be determined by your treatment team.   Patients discharged the day of surgery will not be allowed to drive home, and someone needs to stay with them for 24 hours.  Special instructions:   [place]- Preparing For Surgery  Before surgery, you can play an important role. Because skin is not sterile, your skin needs to be as free of germs as possible. You can reduce the number of germs on your skin by washing with CHG (chlorahexidine gluconate) Soap before surgery.  CHG is an antiseptic cleaner which kills germs and bonds with the skin to continue killing germs even after washing.    Oral Hygiene is also important to reduce your risk of infection.  Remember - BRUSH YOUR TEETH THE MORNING OF SURGERY WITH YOUR REGULAR TOOTHPASTE  Please do not use if you have an allergy to CHG or antibacterial soaps. If your skin becomes reddened/irritated stop using the CHG.  Do not shave (including legs and underarms) for at least 48 hours prior to first CHG shower. It is OK to shave your face.  Please follow these instructions carefully.   1. Shower the NIGHT BEFORE SURGERY and the MORNING OF SURGERY with CHG Soap.   2. If you chose to wash your hair, wash your hair first as  usual with your normal shampoo.  3. After you shampoo, rinse your hair and body thoroughly to remove the shampoo.  4. Use CHG as you would any other liquid soap. You can apply CHG directly to the skin and wash gently with a scrungie or a clean washcloth.   5. Apply the CHG Soap to your body ONLY FROM THE NECK DOWN.  Do not use on open wounds or open sores. Avoid contact with your eyes, ears, mouth and genitals (private parts). Wash Face and genitals (private parts)  with your normal soap.  6. Wash thoroughly, paying special attention to the area where your surgery will be performed.  7. Thoroughly rinse your body with warm water from the neck down.  8. DO NOT shower/wash with your normal soap after using and rinsing off the CHG Soap.  9. Pat yourself dry with a CLEAN TOWEL.  10. Wear CLEAN PAJAMAS to bed the night before surgery, wear comfortable clothes the morning of surgery  11. Place CLEAN SHEETS on your bed the night of your first shower and DO NOT SLEEP WITH PETS.  Day of Surgery: Do not apply any deodorants/lotions.  Please wear clean clothes to the hospital/surgery center.   Remember to brush your teeth WITH YOUR REGULAR TOOTHPASTE.   Please read over the following fact sheets that you were given.

## 2019-06-09 ENCOUNTER — Encounter (HOSPITAL_COMMUNITY)
Admission: RE | Admit: 2019-06-09 | Discharge: 2019-06-09 | Disposition: A | Discharge status: Home / Self Care | Payer: Medicare Other | Source: Ambulatory Visit | Attending: Orthopaedic Surgery | Admitting: Orthopaedic Surgery

## 2019-06-09 ENCOUNTER — Encounter (HOSPITAL_COMMUNITY): Payer: Self-pay

## 2019-06-09 ENCOUNTER — Other Ambulatory Visit: Payer: Self-pay

## 2019-06-09 ENCOUNTER — Other Ambulatory Visit (HOSPITAL_COMMUNITY)
Admission: RE | Admit: 2019-06-09 | Discharge: 2019-06-09 | Disposition: A | Discharge status: Home / Self Care | Payer: Medicare Other | Source: Ambulatory Visit | Attending: Orthopaedic Surgery | Admitting: Orthopaedic Surgery

## 2019-06-09 ENCOUNTER — Ambulatory Visit (HOSPITAL_COMMUNITY)
Admission: RE | Admit: 2019-06-09 | Discharge: 2019-06-09 | Disposition: A | Discharge status: Home / Self Care | Payer: Medicare Other | Source: Ambulatory Visit | Attending: Surgery | Admitting: Surgery

## 2019-06-09 DIAGNOSIS — Z79899 Other long term (current) drug therapy: Secondary | ICD-10-CM | POA: Insufficient documentation

## 2019-06-09 DIAGNOSIS — Z20822 Contact with and (suspected) exposure to covid-19: Secondary | ICD-10-CM | POA: Diagnosis not present

## 2019-06-09 DIAGNOSIS — Z01818 Encounter for other preprocedural examination: Secondary | ICD-10-CM | POA: Diagnosis not present

## 2019-06-09 DIAGNOSIS — Z7902 Long term (current) use of antithrombotics/antiplatelets: Secondary | ICD-10-CM | POA: Diagnosis not present

## 2019-06-09 DIAGNOSIS — E118 Type 2 diabetes mellitus with unspecified complications: Secondary | ICD-10-CM | POA: Diagnosis not present

## 2019-06-09 DIAGNOSIS — Z794 Long term (current) use of insulin: Secondary | ICD-10-CM | POA: Insufficient documentation

## 2019-06-09 DIAGNOSIS — Z96641 Presence of right artificial hip joint: Secondary | ICD-10-CM | POA: Diagnosis not present

## 2019-06-09 DIAGNOSIS — M1611 Unilateral primary osteoarthritis, right hip: Secondary | ICD-10-CM | POA: Insufficient documentation

## 2019-06-09 DIAGNOSIS — Z7982 Long term (current) use of aspirin: Secondary | ICD-10-CM | POA: Insufficient documentation

## 2019-06-09 HISTORY — DX: Unilateral inguinal hernia, without obstruction or gangrene, not specified as recurrent: K40.90

## 2019-06-09 LAB — COMPREHENSIVE METABOLIC PANEL
ALT: 16 U/L (ref 0–44)
AST: 19 U/L (ref 15–41)
Albumin: 4.1 g/dL (ref 3.5–5.0)
Alkaline Phosphatase: 110 U/L (ref 38–126)
Anion gap: 13 (ref 5–15)
BUN: 20 mg/dL (ref 8–23)
CO2: 25 mmol/L (ref 22–32)
Calcium: 9.6 mg/dL (ref 8.9–10.3)
Chloride: 102 mmol/L (ref 98–111)
Creatinine, Ser: 1.19 mg/dL (ref 0.61–1.24)
GFR calc Af Amer: >60 mL/min (ref >60)
GFR calc non Af Amer: 56 mL/min — ABNORMAL LOW (ref >60)
Glucose, Bld: 117 mg/dL — ABNORMAL HIGH (ref 70–99)
Potassium: 4.3 mmol/L (ref 3.5–5.1)
Sodium: 140 mmol/L (ref 135–145)
Total Bilirubin: 0.2 mg/dL — ABNORMAL LOW (ref 0.3–1.2)
Total Protein: 7.7 g/dL (ref 6.5–8.1)

## 2019-06-09 LAB — GLUCOSE, CAPILLARY: Glucose-Capillary: 146 mg/dL — ABNORMAL HIGH (ref 70–99)

## 2019-06-09 LAB — HEMOGLOBIN A1C
Hgb A1c MFr Bld: 7.8 % — ABNORMAL HIGH (ref 4.8–5.6)
Mean Plasma Glucose: 177.16 mg/dL

## 2019-06-09 LAB — SURGICAL PCR SCREEN
MRSA, PCR: NEGATIVE
Staphylococcus aureus: POSITIVE — AB

## 2019-06-09 LAB — CBC
HCT: 41 % (ref 39.0–52.0)
Hemoglobin: 13 g/dL (ref 13.0–17.0)
MCH: 28.3 pg (ref 26.0–34.0)
MCHC: 31.7 g/dL (ref 30.0–36.0)
MCV: 89.1 fL (ref 80.0–100.0)
Platelets: 260 10*3/uL (ref 150–400)
RBC: 4.6 MIL/uL (ref 4.22–5.81)
RDW: 12.2 % (ref 11.5–15.5)
WBC: 8.3 10*3/uL (ref 4.0–10.5)
nRBC: 0 % (ref 0.0–0.2)

## 2019-06-09 NOTE — Progress Notes (Signed)
PCP - Dr. [name] Cardiologist - deneis Urologist - [facility] - last office note visit requested  PPM/ICD - denies  Chest x-ray - 06/09/2019 EKG - 06/09/2019 Stress Test - denies ECHO - 01/23/18 Cardiac Cath - denies  Sleep Study - denies CPAP - N/A  Fasting Blood Sugar - 100 - 150 Checks Blood Sugar 3 times a day   Blood Thinner Instructions: N/A Aspirin Instructions: LD: 06/08/2019  ERAS Protcol - Yes PRE-SURGERY Ensure or G2- o bottle water was available to be provided to patient.  Patient instructed to drink one 10oz bottle of water by 7:00 A.M. the morning of surgery. N COVID TEST- Scheduled for today 06/09/2019. Patient verbalized understanding of self-quarantine instructions, appointment time and place.  Anesthesia review: YES, clearance, records requested- urology,  Patient denies shortness of breath, fever, cough and chest pain at PAT appointment  All instructions explained to the patient, with a verbal understanding of the material. Patient agrees to go over the instructions while at home for a better understanding. Patient also instructed to self quarantine after being tested for COVID-19. The opportunity to ask questions was provided.

## 2019-06-10 ENCOUNTER — Telehealth: Payer: Self-pay | Admitting: Orthopaedic Surgery

## 2019-06-10 LAB — SARS CORONAVIRUS 2 (TAT 6-24 HRS): SARS Coronavirus 2: NEGATIVE

## 2019-06-10 NOTE — Progress Notes (Addendum)
Anesthesia Chart Review:  Case: [id number] Date/Time: 06/12/19 0948   Procedure: RIGHT TOTAL HIP ARTHROPLASTY DIRECT ANTERIOR (Right Hip)   Anesthesia type: Spinal   Pre-op diagnosis: right hip osteoarthritis   Location: [facility] OR ROOM 06 / [facility]   Surgeons: Marybelle Killings, MD      DISCUSSION: Patient is a 86 year old male scheduled for the above procedure. Initially seen by Joni Fears, MD for evaluation of right THA; however has chronic osteomyelitis pubic symphysis and anterior approach recommended, so he was referred to Dr. Lorin Mercy. PCP and ID preoperative input obtained.   History includes never smoker, DM2, prostate cancer (s/p radioactive seed implant 01/23/08), chronic pelvic osteomyelitis (pubic symphysis, right rectus abscss s/p IR drains 01/2018; involving the pelvic/thigh region (CT guided drainage of right rectus, left quadratus femoris, left ileo psoas abscesses 01/2018; on chronic anti-infective therapy), drug induced hepatitis, right inguinal hernia.   He has a chronic foley catheter that is periodically exchanged through [facility]. Unable to get UA at his PAT visit, so this will need to be collected on the day of surgery, otherwise no additional day of surgery recommendations per Dr. Lorin Mercy.   - He has medical clearance from Dr. Gerarda Fraction.   - ID input per [name] as of 05/04/19: "It seems to me that his right hip osteoarthritis and pain from this is significantly pitting his quality of life.  If a right total hip arthroplasty could significantly ameliorate this and it sounds like it could then the benefit of this to me would seem to outweigh the risk of infection from the surgery itself fourth of infection spreading from the symphysis pubis.  I think if he and his surgeon want to proceed with surgery I would be okay with that but certainly I cautioned her no guarantees I could but certainly could not guarantee that he would not have spread of infection or new infection and  certainly all medical and surgical interventions, with risks." Currently on Augment BID.  A1c 7.8%. Reported fasting CBGs ~ 100-150. Dr. Lorin Mercy notified of A1c results.   Last ASA 06/08/19.   Received 2nd Moderna vaccine 04/25/19.  Presurgical COVID-19 test was negative on 06/09/2019.  Caregiver is Gust Brooms [phone].  Dr. Lorin Mercy is recommending pre-operative Vancomycin due to + MSSA with history of MRSA.  Anesthesia team to evaluate on the day of surgery.    VS: BP (!) 156/99   Pulse (!) 106   Temp 36.9 C (Oral)   Resp 18   Ht 5\' 8"  (1.727 m)   Wt 79.3 kg   SpO2 100%   BMI 26.58 kg/m  It does not appear that BP was rechecked at PAT, but HR was 93 bpm on EKG. BP Readings from Last 3 Encounters:  06/09/19 (!) 156/99  05/28/19 (!) 161/98  05/14/19 91/66    PROVIDERS: [name], MD is PCP  Tommy Medal, Roderic Scarce, MD is ID Urologist is with [facility]. He is seen for chronic indwelling foley catheter.    LABS: Labs reviewed: Acceptable for surgery. (all labs ordered are listed, but only abnormal results are displayed)  Labs Reviewed  SURGICAL PCR SCREEN - Abnormal; Notable for the following components:      Result Value   Staphylococcus aureus POSITIVE (*)    All other components within normal limits  GLUCOSE, CAPILLARY - Abnormal; Notable for the following components:   Glucose-Capillary 146 (*)    All other components within normal limits  COMPREHENSIVE METABOLIC  PANEL - Abnormal; Notable for the following components:   Glucose, Bld 117 (*)    Total Bilirubin 0.2 (*)    GFR calc non Af Amer 56 (*)    All other components within normal limits  HEMOGLOBIN A1C - Abnormal; Notable for the following components:   Hgb A1c MFr Bld 7.8 (*)    All other components within normal limits  CBC     IMAGES: CXR 06/09/19: FINDINGS: Lung volumes are normal. No consolidative airspace disease. No pleural effusions. No pneumothorax. No pulmonary nodule or  mass noted. Pulmonary vasculature and the cardiomediastinal silhouette are within normal limits. IMPRESSION: No radiographic evidence of acute cardiopulmonary disease.   EKG: 06/09/19: NSR   CV: Echo 01/23/18: Study Conclusions  - Left ventricle: The cavity size was normal. Wall thickness was  increased in a pattern of mild LVH. Systolic function was normal.  The estimated ejection fraction was in the range of 60% to 65%.  Wall motion was normal; there were no regional wall motion  abnormalities. Doppler parameters are consistent with abnormal  left ventricular relaxation (grade 1 diastolic dysfunction).  - Aortic valve: Mildly calcified annulus. Trileaflet; mildly  calcified leaflets.  - Mitral valve: Mildly calcified annulus. There was trivial  regurgitation.  - Right atrium: Central venous pressure (est): 3 mm Hg.  - Tricuspid valve: Nodular thickening. There was mild  regurgitation.  - Pulmonary arteries: PA peak pressure: 37 mm Hg (S).  - Pericardium, extracardiac: There was no pericardial effusion.   Carotid 08/27/13:  Impression: This study is negative for hemodynamically significant stenosis involving extracranial carotid arteries bilaterally.  Homogeneous plaque was seen in the right distal CCA and mild wall thickening in the left mid CCA.   Past Medical History:  Diagnosis Date  . 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 ([place]) 04/21/2018  . Diabetes ([place])   . Drug-induced hepatitis 06/05/2018  . Inguinal hernia    06/09/2019: per patient " a long time ago on the right side"  . Left eye pain   . Localized osteoarthrosis of right hip 01/05/2019  . Prostate cancer [facility])     Past Surgical History:  Procedure Laterality Date  . HERNIA REPAIR    . IR RADIOLOGIST EVAL & MGMT  03/06/2018  . PROSTATE SURGERY      MEDICATIONS: . acetaminophen (TYLENOL) 325 MG tablet  . ALPRAZolam (XANAX) 0.5 MG tablet  . amitriptyline (ELAVIL) 10 MG tablet   . amoxicillin-clavulanate (AUGMENTIN) 875-125 MG tablet  . aspirin EC 81 MG tablet  . ferrous sulfate 325 (65 FE) MG tablet  . furosemide (LASIX) 20 MG tablet  . glimepiride (AMARYL) 4 MG tablet  . insulin aspart (NOVOLOG) 100 UNIT/ML injection  . levothyroxine (SYNTHROID, LEVOTHROID) 100 MCG tablet  . meloxicam (MOBIC) 7.5 MG tablet  . nystatin (MYCOSTATIN/NYSTOP) powder  . pravastatin (PRAVACHOL) 10 MG tablet  . primidone (MYSOLINE) 50 MG tablet  . SOLIQUA 100-33 UNT-MCG/ML SOPN   No current facility-administered medications for this encounter.    Myra Gianotti, PA-C Surgical Short Stay/Anesthesiology [facility] Phone [phone] [facility] Phone [phone] 06/11/2019 11:35 AM

## 2019-06-10 NOTE — Telephone Encounter (Signed)
Positive MSSA but in past also had MRSA positive. Cover with Vanc pre-op , please put in order . Can give Vanc instead of Ancef. Thanks.

## 2019-06-10 NOTE — Telephone Encounter (Signed)
Patient's care giver Percell Miller called. Patient has STAFF infection. Would like to know if the surgery is still on for Friday. His call back number is [phone]. Also would like some antibiotics.

## 2019-06-10 NOTE — Telephone Encounter (Signed)
Please advise 

## 2019-06-11 MED ORDER — BUPIVACAINE LIPOSOME 1.3 % IJ SUSP
20.0000 mL | Freq: Once | INTRAMUSCULAR | Status: DC
  Filled 2019-06-11: qty 20

## 2019-06-11 NOTE — H&P (Signed)
TOTAL HIP ADMISSION H&P  Patient is admitted for right total hip arthroplasty.  Subjective:  Chief Complaint: right hip pain  HPI: Anthony Dunlap, 86 y.o. male, has a history of pain and functional disability in the right hip(s) due to arthritis and patient has failed non-surgical conservative treatments for greater than 12 weeks to include corticosteriod injections, use of assistive devices and activity modification.  Onset of symptoms was gradual starting 10 years ago with gradually worsening course since that time.The patient noted no past surgery on the right hip(s).  Patient currently rates pain in the right hip at 10 out of 10 with activity. Patient has worsening of pain with activity and weight bearing, trendelenberg gait, pain that interfers with activities of daily living, pain with passive range of motion and joint swelling. Patient has evidence of subchondral sclerosis, periarticular osteophytes and joint space narrowing by imaging studies. This condition presents safety issues increasing the risk of falls.  Patient Active Problem List   Diagnosis Date Noted  . Osteoarthritis of right hip 01/05/2019  . Pelvic abscess in male [facility]) 09/11/2018  . Drug-induced hepatitis 06/05/2018  . Calculus of gallbladder without cholecystitis without obstruction   . Fungemia/Candida albicans 05/10/2018  . UTI (urinary tract infection) 05/08/2018  . Complicated UTI (urinary tract infection) 05/08/2018  . 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 ([place]) 04/21/2018  . Abscess of left thigh   . Pressure injury of skin 02/10/2018  . Enterococcus faecalis infection 01/27/2018  . Abscess   . Psoas abscess ([place]) 01/23/2018  . Sepsis due to Bacteroides species ([place]) 01/22/2018  . Sepsis without acute organ dysfunction ([place])   . DKA (diabetic ketoacidoses) ([place]) 01/17/2018  . Normocytic anemia 01/17/2018  . Chronic anemia 01/03/2018  . Chronic indwelling Foley catheter 01/03/2018  .  Elevated alkaline phosphatase level 01/03/2018  . Hypothyroidism 01/03/2018  . Physical deconditioning 01/03/2018  . 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 HLD (hyperlipidemia) 08/19/2013  . Type 2 diabetes mellitus ([place])   . Prostate cancer [facility])    Past Medical History:  Diagnosis Date  . 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 ([place]) 04/21/2018  . Diabetes ([place])   . Drug-induced hepatitis 06/05/2018  . Inguinal hernia    06/09/2019: per patient " a long time ago on the right side"  . Left eye pain   . Localized osteoarthrosis of right hip 01/05/2019  . Prostate cancer [facility])     Past Surgical History:  Procedure Laterality Date  . HERNIA REPAIR    . IR RADIOLOGIST EVAL & MGMT  03/06/2018  . PROSTATE SURGERY      Current Facility-Administered Medications  Medication Dose Route Frequency Provider Last Rate Last Admin  . [START ON 06/12/2019] bupivacaine liposome (EXPAREL) 1.3 % injection 266 mg  20 mL Infiltration Once Lanae Crumbly, PA-C       Current Outpatient Medications  Medication Sig Dispense Refill Last Dose  . acetaminophen (TYLENOL) 325 MG tablet Take 2 tablets (650 mg total) by mouth every 6 (six) hours as needed for mild pain, fever or headache.     . ALPRAZolam (XANAX) 0.5 MG tablet Take 0.5 mg by mouth 3 (three) times daily as needed.     [name] amitriptyline (ELAVIL) 10 MG tablet Take 10 mg by mouth at bedtime.      [name] amoxicillin-clavulanate (AUGMENTIN) 875-125 MG tablet Take 1 tablet by mouth 2 (two) times daily. 60 tablet 11   . aspirin EC 81 MG tablet  Take 81 mg by mouth at bedtime.      . ferrous sulfate 325 (65 FE) MG tablet Take 1 tablet (325 mg total) by mouth daily with breakfast. (Patient taking differently: Take 325 mg by mouth every evening. )  3   . furosemide (LASIX) 20 MG tablet Take 20 mg by mouth every morning.      [name] glimepiride (AMARYL) 4 MG tablet Take 4 mg by mouth every morning.      . insulin aspart (NOVOLOG) 100 UNIT/ML injection  Inject 0-15 Units into the skin 3 (three) times daily with meals. Sliding scale insulin Less than 70 initiate hypoglycemia protocol 70-120  0 units 120-150 2 unit 151-200 3 units 201-250 3 units 251-300 5 units 301-350 8 units 351-400 11 units  Greater than 400 15 units , call MD (Patient taking differently: Inject 0-15 Units into the skin See admin instructions. Sliding scale insulin as needed for high blood sugars Less than 70 initiate hypoglycemia protocol 70-120  1 units 120-150 3 unit 151-200 4 units 201-250 5 units 251-300 6 units 301-350 9units 351-400 12 units  Greater than 400 15 units , call MD) 10 mL 11   . levothyroxine (SYNTHROID, LEVOTHROID) 100 MCG tablet Take 100 mcg by mouth every morning.      . meloxicam (MOBIC) 7.5 MG tablet Take 7.5 mg by mouth 2 (two) times daily.      [name] nystatin (MYCOSTATIN/NYSTOP) powder Apply topically 4 (four) times daily. 60 g 8   . pravastatin (PRAVACHOL) 10 MG tablet Take 10 mg by mouth every evening.      . primidone (MYSOLINE) 50 MG tablet Take 100 mg by mouth at bedtime.      Willeen Niece 100-33 UNT-MCG/ML SOPN Inject 44 Units as directed every morning.       Allergies  Allergen Reactions  . Fluconazole Other (See Comments)    Drug-induced hepatitis    Social History   Tobacco Use  . Smoking status: Never Smoker  . Smokeless tobacco: Never Used  Substance Use Topics  . Alcohol use: No    Family History  Problem Relation Age of Onset  . Pneumonia Father   . Cancer Sister      Review of Systems  Constitutional: Positive for activity change.  HENT: Negative.   Respiratory: Negative.   Cardiovascular: Negative.   Gastrointestinal: Negative.   Musculoskeletal: Negative.   Skin: Negative.   Neurological: Negative.   Hematological: Negative.   Psychiatric/Behavioral: Negative.     Objective:  Physical Exam  Constitutional: He is oriented to person, place, and time. He appears well-developed.  HENT:  Head:  Normocephalic and atraumatic.  Eyes: Pupils are equal, round, and reactive to light.  Cardiovascular: Normal heart sounds.  Respiratory: Effort normal. No respiratory distress.  GI: Soft. He exhibits no distension. There is no abdominal tenderness.  Musculoskeletal:        General: Tenderness present.     Cervical back: Normal range of motion.  Neurological: He is alert and oriented to person, place, and time.  Skin: Skin is warm and dry.  Psychiatric: He has a normal mood and affect.    Vital signs in last 24 hours:    Labs:   Estimated body mass index is 26.58 kg/m as calculated from the following:   Height as of 06/09/19: 5\' 8"  (1.727 m).   Weight as of 06/09/19: 79.3 kg.   Imaging Review Plain radiographs demonstrate moderate degenerative joint disease of the right hip(s). The  bone quality appears to be good for age and reported activity level.      Assessment/Plan:  End stage arthritis, right hip(s)  The patient history, physical examination, clinical judgement of the provider and imaging studies are consistent with end stage degenerative joint disease of the right hip(s) and total hip arthroplasty is deemed medically necessary. The treatment options including medical management, injection therapy, arthroscopy and arthroplasty were discussed at length. The risks and benefits of total hip arthroplasty were presented and reviewed. The risks due to aseptic loosening, infection, stiffness, dislocation/subluxation,  thromboembolic complications and other imponderables were discussed.  The patient acknowledged the explanation, agreed to proceed with the plan and consent was signed. Patient is being admitted for inpatient treatment for surgery, pain control, PT, OT, prophylactic antibiotics, VTE prophylaxis, progressive ambulation and ADL's and discharge planning.The patient is planning to be discharged home with home health services

## 2019-06-11 NOTE — Anesthesia Preprocedure Evaluation (Addendum)
Anesthesia Evaluation  Patient identified by MRN, date of birth, ID band Patient awake    Reviewed: Allergy & Precautions, NPO status , Patient's Chart, lab work & pertinent test results  History of Anesthesia Complications Negative for: history of anesthetic complications  Airway Mallampati: III  TM Distance: >3 FB Neck ROM: Full    Dental  (+) Dental Advisory Given   Pulmonary neg pulmonary ROS,    breath sounds clear to auscultation       Cardiovascular negative cardio ROS   Rhythm:Regular     Neuro/Psych  Neuromuscular disease negative psych ROS   GI/Hepatic negative GI ROS, (+) Hepatitis -  Endo/Other  diabetesHypothyroidism   Renal/GU      Musculoskeletal  (+) Arthritis ,   Abdominal   Peds  Hematology  (+) anemia ,   Anesthesia Other Findings   Reproductive/Obstetrics                            Anesthesia Physical Anesthesia Plan  ASA: II  Anesthesia Plan: MAC and Spinal   Post-op Pain Management:    Induction: Intravenous  PONV Risk Score and Plan: 1 and Treatment may vary due to age or medical condition and Propofol infusion  Airway Management Planned: Nasal Cannula  Additional Equipment: None  Intra-op Plan:   Post-operative Plan:   Informed Consent: I have reviewed the patients History and Physical, chart, labs and discussed the procedure including the risks, benefits and alternatives for the proposed anesthesia with the patient or authorized representative who has indicated his/her understanding and acceptance.     Dental advisory given  Plan Discussed with: CRNA and Surgeon  Anesthesia Plan Comments: (PAT note written by Myra Gianotti, PA-C. He has a chronic foley catheter.  )       Anesthesia Quick Evaluation

## 2019-06-12 ENCOUNTER — Encounter (HOSPITAL_COMMUNITY): Payer: Self-pay | Admitting: Orthopaedic Surgery

## 2019-06-12 ENCOUNTER — Encounter (HOSPITAL_COMMUNITY)
Admission: AD | Disposition: A | Discharge status: Skilled Nursing Facility | Payer: Self-pay | Source: Home / Self Care | Attending: Orthopaedic Surgery

## 2019-06-12 ENCOUNTER — Ambulatory Visit (HOSPITAL_COMMUNITY): Payer: Medicare Other

## 2019-06-12 ENCOUNTER — Other Ambulatory Visit: Payer: Self-pay

## 2019-06-12 ENCOUNTER — Ambulatory Visit (HOSPITAL_COMMUNITY): Payer: Medicare Other | Admitting: Vascular Surgery

## 2019-06-12 ENCOUNTER — Inpatient Hospital Stay (HOSPITAL_COMMUNITY)
Admission: AD | Admit: 2019-06-12 | Discharge: 2019-06-16 | DRG: 470 | Disposition: A | Discharge status: Skilled Nursing Facility | Payer: Medicare Other | Attending: Orthopaedic Surgery | Admitting: Orthopaedic Surgery

## 2019-06-12 ENCOUNTER — Other Ambulatory Visit: Payer: Self-pay | Admitting: Infectious Disease

## 2019-06-12 ENCOUNTER — Observation Stay (HOSPITAL_COMMUNITY): Payer: Medicare Other

## 2019-06-12 ENCOUNTER — Ambulatory Visit (HOSPITAL_COMMUNITY): Payer: Medicare Other | Admitting: Certified Registered Nurse Anesthetist

## 2019-06-12 DIAGNOSIS — Z79890 Hormone replacement therapy (postmenopausal): Secondary | ICD-10-CM

## 2019-06-12 DIAGNOSIS — M1611 Unilateral primary osteoarthritis, right hip: Secondary | ICD-10-CM | POA: Diagnosis present

## 2019-06-12 DIAGNOSIS — H4920 Sixth [abducent] nerve palsy, unspecified eye: Secondary | ICD-10-CM | POA: Diagnosis present

## 2019-06-12 DIAGNOSIS — E039 Hypothyroidism, unspecified: Secondary | ICD-10-CM | POA: Diagnosis present

## 2019-06-12 DIAGNOSIS — Z888 Allergy status to other drugs, medicaments and biological substances status: Secondary | ICD-10-CM

## 2019-06-12 DIAGNOSIS — Z419 Encounter for procedure for purposes other than remedying health state, unspecified: Secondary | ICD-10-CM

## 2019-06-12 DIAGNOSIS — Z8546 Personal history of malignant neoplasm of prostate: Secondary | ICD-10-CM

## 2019-06-12 DIAGNOSIS — Z7982 Long term (current) use of aspirin: Secondary | ICD-10-CM

## 2019-06-12 DIAGNOSIS — D62 Acute posthemorrhagic anemia: Secondary | ICD-10-CM | POA: Diagnosis not present

## 2019-06-12 DIAGNOSIS — Z791 Long term (current) use of non-steroidal anti-inflammatories (NSAID): Secondary | ICD-10-CM

## 2019-06-12 DIAGNOSIS — Z79899 Other long term (current) drug therapy: Secondary | ICD-10-CM

## 2019-06-12 DIAGNOSIS — E785 Hyperlipidemia, unspecified: Secondary | ICD-10-CM | POA: Diagnosis present

## 2019-06-12 DIAGNOSIS — Z794 Long term (current) use of insulin: Secondary | ICD-10-CM

## 2019-06-12 DIAGNOSIS — E119 Type 2 diabetes mellitus without complications: Secondary | ICD-10-CM | POA: Diagnosis present

## 2019-06-12 DIAGNOSIS — Z09 Encounter for follow-up examination after completed treatment for conditions other than malignant neoplasm: Secondary | ICD-10-CM

## 2019-06-12 HISTORY — PX: TOTAL HIP ARTHROPLASTY: SHX124

## 2019-06-12 LAB — URINALYSIS, ROUTINE W REFLEX MICROSCOPIC
Bilirubin Urine: NEGATIVE
Glucose, UA: NEGATIVE mg/dL
Hgb urine dipstick: NEGATIVE
Ketones, ur: NEGATIVE mg/dL
Nitrite: POSITIVE — AB
Protein, ur: NEGATIVE mg/dL
Specific Gravity, Urine: 1.008 (ref 1.005–1.030)
pH: 7 (ref 5.0–8.0)

## 2019-06-12 LAB — GLUCOSE, CAPILLARY
Glucose-Capillary: 175 mg/dL — ABNORMAL HIGH (ref 70–99)
Glucose-Capillary: 159 mg/dL — ABNORMAL HIGH (ref 70–99)
Glucose-Capillary: 305 mg/dL — ABNORMAL HIGH (ref 70–99)

## 2019-06-12 SURGERY — ARTHROPLASTY, HIP, TOTAL, ANTERIOR APPROACH
Anesthesia: Monitor Anesthesia Care | Site: Hip | Laterality: Right

## 2019-06-12 MED ORDER — ONDANSETRON HCL 4 MG/2ML IJ SOLN
INTRAMUSCULAR | Status: AC
  Filled 2019-06-12: qty 2

## 2019-06-12 MED ORDER — FENTANYL CITRATE (PF) 250 MCG/5ML IJ SOLN
INTRAMUSCULAR | Status: AC
  Filled 2019-06-12: qty 5

## 2019-06-12 MED ORDER — PHENYLEPHRINE HCL-NACL 10-0.9 MG/250ML-% IV SOLN
INTRAVENOUS | Status: DC | PRN
Start: 2019-06-12 — End: 2019-06-12
  Administered 2019-06-12: 50 ug/min via INTRAVENOUS

## 2019-06-12 MED ORDER — ONDANSETRON HCL 4 MG/2ML IJ SOLN: 4.0000 mg | Freq: Four times a day (QID) | INTRAMUSCULAR | Status: DC | PRN

## 2019-06-12 MED ORDER — OXYCODONE HCL 5 MG PO TABS
5.0000 mg | ORAL_TABLET | Freq: Four times a day (QID) | ORAL | Status: DC | PRN
  Administered 2019-06-12 – 2019-06-13 (×3): 5 mg via ORAL
  Filled 2019-06-12 (×3): qty 1

## 2019-06-12 MED ORDER — TRANEXAMIC ACID-NACL 1000-0.7 MG/100ML-% IV SOLN
INTRAVENOUS | Status: DC | PRN
Start: 2019-06-12 — End: 2019-06-12
  Administered 2019-06-12: 1000 mg via INTRAVENOUS

## 2019-06-12 MED ORDER — ONDANSETRON HCL 4 MG PO TABS: 4.0000 mg | ORAL_TABLET | Freq: Four times a day (QID) | ORAL | Status: DC | PRN

## 2019-06-12 MED ORDER — BUPIVACAINE HCL (PF) 0.25 % IJ SOLN
INTRAMUSCULAR | Status: AC
  Filled 2019-06-12: qty 30

## 2019-06-12 MED ORDER — PRIMIDONE 50 MG PO TABS
100.0000 mg | ORAL_TABLET | Freq: Every day | ORAL | Status: DC
  Administered 2019-06-12 – 2019-06-15 (×4): 100 mg via ORAL
  Filled 2019-06-12 (×7): qty 2

## 2019-06-12 MED ORDER — MENTHOL 3 MG MT LOZG: 1.0000 | LOZENGE | OROMUCOSAL | Status: DC | PRN

## 2019-06-12 MED ORDER — AMITRIPTYLINE HCL 10 MG PO TABS
10.0000 mg | ORAL_TABLET | Freq: Every day | ORAL | Status: DC
  Administered 2019-06-12 – 2019-06-15 (×4): 10 mg via ORAL
  Filled 2019-06-12 (×6): qty 1

## 2019-06-12 MED ORDER — PHENOL 1.4 % MT LIQD: 1.0000 | OROMUCOSAL | Status: DC | PRN

## 2019-06-12 MED ORDER — ONDANSETRON HCL 4 MG/2ML IJ SOLN
INTRAMUSCULAR | Status: DC | PRN
  Administered 2019-06-12: 4 mg via INTRAVENOUS

## 2019-06-12 MED ORDER — METOCLOPRAMIDE HCL 5 MG/ML IJ SOLN: 5.0000 mg | Freq: Three times a day (TID) | INTRAMUSCULAR | Status: DC | PRN

## 2019-06-12 MED ORDER — BUPIVACAINE LIPOSOME 1.3 % IJ SUSP
INTRAMUSCULAR | Status: DC | PRN
  Administered 2019-06-12: 10 mL

## 2019-06-12 MED ORDER — FERROUS SULFATE 325 (65 FE) MG PO TABS
325.0000 mg | ORAL_TABLET | Freq: Every evening | ORAL | Status: DC
  Administered 2019-06-12 – 2019-06-15 (×4): 325 mg via ORAL
  Filled 2019-06-12 (×4): qty 1

## 2019-06-12 MED ORDER — PROPOFOL 10 MG/ML IV BOLUS
INTRAVENOUS | Status: AC
  Filled 2019-06-12: qty 20

## 2019-06-12 MED ORDER — GLIMEPIRIDE 4 MG PO TABS
4.0000 mg | ORAL_TABLET | Freq: Every morning | ORAL | Status: DC
  Administered 2019-06-12 – 2019-06-16 (×5): 4 mg via ORAL
  Filled 2019-06-12 (×2): qty 1
  Filled 2019-06-12: qty 2
  Filled 2019-06-12: qty 1
  Filled 2019-06-12: qty 2

## 2019-06-12 MED ORDER — PROPOFOL 10 MG/ML IV BOLUS
INTRAVENOUS | Status: DC | PRN
  Administered 2019-06-12: 20 mg via INTRAVENOUS

## 2019-06-12 MED ORDER — HYDROCODONE-ACETAMINOPHEN 10-325 MG PO TABS: 1.0000 | ORAL_TABLET | Freq: Four times a day (QID) | ORAL | 0 refills | Status: DC | PRN

## 2019-06-12 MED ORDER — METOCLOPRAMIDE HCL 5 MG PO TABS
5.0000 mg | ORAL_TABLET | Freq: Three times a day (TID) | ORAL | Status: DC | PRN
  Administered 2019-06-12: 5 mg via ORAL
  Filled 2019-06-12: qty 1

## 2019-06-12 MED ORDER — ALPRAZOLAM 0.5 MG PO TABS
0.5000 mg | ORAL_TABLET | Freq: Three times a day (TID) | ORAL | Status: DC | PRN
  Administered 2019-06-12 – 2019-06-13 (×2): 0.5 mg via ORAL
  Filled 2019-06-12 (×2): qty 1

## 2019-06-12 MED ORDER — ASPIRIN EC 325 MG PO TBEC
325.0000 mg | DELAYED_RELEASE_TABLET | Freq: Every day | ORAL | Status: DC
  Administered 2019-06-13 – 2019-06-16 (×4): 325 mg via ORAL
  Filled 2019-06-12 (×4): qty 1

## 2019-06-12 MED ORDER — PRAVASTATIN SODIUM 10 MG PO TABS
10.0000 mg | ORAL_TABLET | Freq: Every evening | ORAL | Status: DC
  Administered 2019-06-12 – 2019-06-15 (×4): 10 mg via ORAL
  Filled 2019-06-12 (×4): qty 1

## 2019-06-12 MED ORDER — INSULIN GLARGINE-LIXISENATIDE 100-33 UNT-MCG/ML ~~LOC~~ SOPN: 44.0000 [IU] | PEN_INJECTOR | Freq: Every morning | SUBCUTANEOUS | Status: DC

## 2019-06-12 MED ORDER — POLYETHYLENE GLYCOL 3350 17 G PO PACK
17.0000 g | PACK | Freq: Every day | ORAL | Status: DC | PRN
  Filled 2019-06-12: qty 1

## 2019-06-12 MED ORDER — CEFAZOLIN SODIUM-DEXTROSE 2-4 GM/100ML-% IV SOLN
2.0000 g | INTRAVENOUS | Status: AC
  Administered 2019-06-12: 2 g via INTRAVENOUS
  Filled 2019-06-12: qty 100

## 2019-06-12 MED ORDER — LACTATED RINGERS IV SOLN: INTRAVENOUS | Status: DC

## 2019-06-12 MED ORDER — PHENYLEPHRINE 40 MCG/ML (10ML) SYRINGE FOR IV PUSH (FOR BLOOD PRESSURE SUPPORT)
PREFILLED_SYRINGE | INTRAVENOUS | Status: AC
  Filled 2019-06-12: qty 10

## 2019-06-12 MED ORDER — INSULIN ASPART 100 UNIT/ML ~~LOC~~ SOLN
0.0000 [IU] | SUBCUTANEOUS | Status: DC
  Administered 2019-06-12: 9 [IU] via SUBCUTANEOUS
  Administered 2019-06-13: 11 [IU] via SUBCUTANEOUS

## 2019-06-12 MED ORDER — ASPIRIN 325 MG PO TBEC: 325.0000 mg | DELAYED_RELEASE_TABLET | Freq: Every day | ORAL | 0 refills | Status: DC

## 2019-06-12 MED ORDER — TRANEXAMIC ACID-NACL 1000-0.7 MG/100ML-% IV SOLN
INTRAVENOUS | Status: AC
  Filled 2019-06-12: qty 100

## 2019-06-12 MED ORDER — SODIUM CHLORIDE 0.9 % IV SOLN: INTRAVENOUS | Status: DC

## 2019-06-12 MED ORDER — DOCUSATE SODIUM 100 MG PO CAPS
100.0000 mg | ORAL_CAPSULE | Freq: Two times a day (BID) | ORAL | Status: DC
  Administered 2019-06-12 – 2019-06-16 (×9): 100 mg via ORAL
  Filled 2019-06-12 (×9): qty 1

## 2019-06-12 MED ORDER — ACETAMINOPHEN 325 MG PO TABS
325.0000 mg | ORAL_TABLET | Freq: Four times a day (QID) | ORAL | Status: DC | PRN
  Administered 2019-06-14 – 2019-06-16 (×3): 650 mg via ORAL
  Filled 2019-06-12 (×3): qty 2

## 2019-06-12 MED ORDER — HYDROMORPHONE HCL 1 MG/ML IJ SOLN: 0.5000 mg | INTRAMUSCULAR | Status: DC | PRN

## 2019-06-12 MED ORDER — ALBUMIN HUMAN 5 % IV SOLN: INTRAVENOUS | Status: DC | PRN

## 2019-06-12 MED ORDER — CEFAZOLIN SODIUM-DEXTROSE 1-4 GM/50ML-% IV SOLN
1.0000 g | Freq: Three times a day (TID) | INTRAVENOUS | Status: AC
  Administered 2019-06-12 – 2019-06-13 (×2): 1 g via INTRAVENOUS
  Filled 2019-06-12 (×2): qty 50

## 2019-06-12 MED ORDER — FUROSEMIDE 20 MG PO TABS
20.0000 mg | ORAL_TABLET | Freq: Every morning | ORAL | Status: DC
  Administered 2019-06-12 – 2019-06-16 (×5): 20 mg via ORAL
  Filled 2019-06-12 (×5): qty 1

## 2019-06-12 MED ORDER — LEVOTHYROXINE SODIUM 100 MCG PO TABS
100.0000 ug | ORAL_TABLET | Freq: Every morning | ORAL | Status: DC
  Administered 2019-06-13 – 2019-06-16 (×4): 100 ug via ORAL
  Filled 2019-06-12 (×4): qty 1

## 2019-06-12 MED ORDER — BUPIVACAINE-EPINEPHRINE 0.25% -1:200000 IJ SOLN
INTRAMUSCULAR | Status: DC | PRN
  Administered 2019-06-12: 10 mL

## 2019-06-12 MED ORDER — PROPOFOL 500 MG/50ML IV EMUL
INTRAVENOUS | Status: DC | PRN
  Administered 2019-06-12: 100 ug/kg/min via INTRAVENOUS

## 2019-06-12 MED ORDER — PROPOFOL 1000 MG/100ML IV EMUL
INTRAVENOUS | Status: AC
  Filled 2019-06-12: qty 100

## 2019-06-12 MED ORDER — FENTANYL CITRATE (PF) 100 MCG/2ML IJ SOLN
INTRAMUSCULAR | Status: DC | PRN
  Administered 2019-06-12: 50 ug via INTRAVENOUS

## 2019-06-12 SURGICAL SUPPLY — 61 items
ACETAB CUP W GRIPTION 54MM (Plate) ×1 IMPLANT
ACETAB CUP W/GRIPTION 54 (Plate) ×2 IMPLANT
APL SKNCLS STERI-STRIP NONHPOA (GAUZE/BANDAGES/DRESSINGS) ×1
ARTICULEZE HEAD (Hips) ×3 IMPLANT
BENZOIN TINCTURE PRP APPL 2/3 (GAUZE/BANDAGES/DRESSINGS) ×3 IMPLANT
BLADE CLIPPER SURG (BLADE) IMPLANT
BLADE SAW SGTL 18X1.27X75 (BLADE) ×2 IMPLANT
BLADE SAW SGTL 18X1.27X75MM (BLADE) ×1
CELLS DAT CNTRL 66122 CELL SVR (MISCELLANEOUS) ×1 IMPLANT
CLOSURE WOUND 1/2 X4 (GAUZE/BANDAGES/DRESSINGS) ×1
COVER SURGICAL LIGHT HANDLE (MISCELLANEOUS) ×3 IMPLANT
COVER WAND RF STERILE (DRAPES) ×3 IMPLANT
CUP ACETAB W/GRIPTION 54 (Plate) IMPLANT
DRAPE C-ARM 42X72 X-RAY (DRAPES) ×3 IMPLANT
DRAPE IMP U-DRAPE 54X76 (DRAPES) ×3 IMPLANT
DRAPE STERI IOBAN 125X83 (DRAPES) ×3 IMPLANT
DRAPE U-SHAPE 47X51 STRL (DRAPES) ×9 IMPLANT
DRSG MEPILEX BORDER 4X12 (GAUZE/BANDAGES/DRESSINGS) ×2 IMPLANT
DRSG MEPILEX BORDER 4X8 (GAUZE/BANDAGES/DRESSINGS) ×1 IMPLANT
DURAPREP 26ML APPLICATOR (WOUND CARE) ×3 IMPLANT
ELECT BLADE 4.0 EZ CLEAN MEGAD (MISCELLANEOUS)
ELECT CAUTERY BLADE 6.4 (BLADE) ×3 IMPLANT
ELECT REM PT RETURN 9FT ADLT (ELECTROSURGICAL) ×3
ELECTRODE BLDE 4.0 EZ CLN MEGD (MISCELLANEOUS) IMPLANT
ELECTRODE REM PT RTRN 9FT ADLT (ELECTROSURGICAL) ×1 IMPLANT
ELIMINATOR HOLE APEX DEPUY (Hips) ×2 IMPLANT
FACESHIELD WRAPAROUND (MASK) ×9 IMPLANT
FACESHIELD WRAPAROUND OR TEAM (MASK) ×2 IMPLANT
GLOVE BIOGEL PI IND STRL 8 (GLOVE) ×2 IMPLANT
GLOVE BIOGEL PI INDICATOR 8 (GLOVE) ×6
GLOVE ORTHO TXT STRL SZ7.5 (GLOVE) ×6 IMPLANT
GOWN STRL REUS W/ TWL LRG LVL3 (GOWN DISPOSABLE) ×1 IMPLANT
GOWN STRL REUS W/ TWL XL LVL3 (GOWN DISPOSABLE) ×1 IMPLANT
GOWN STRL REUS W/TWL 2XL LVL3 (GOWN DISPOSABLE) ×3 IMPLANT
GOWN STRL REUS W/TWL LRG LVL3 (GOWN DISPOSABLE) ×3
GOWN STRL REUS W/TWL XL LVL3 (GOWN DISPOSABLE) ×3
HEAD ARTICULEZE (Hips) IMPLANT
KIT BASIN OR (CUSTOM PROCEDURE TRAY) ×3 IMPLANT
KIT TURNOVER KIT B (KITS) ×3 IMPLANT
LINER NEUTRAL 54X36MM PLUS 4 (Hips) ×2 IMPLANT
MANIFOLD NEPTUNE II (INSTRUMENTS) ×3 IMPLANT
NS IRRIG 1000ML POUR BTL (IV SOLUTION) ×3 IMPLANT
PACK TOTAL JOINT (CUSTOM PROCEDURE TRAY) ×3 IMPLANT
PAD ARMBOARD 7.5X6 YLW CONV (MISCELLANEOUS) ×6 IMPLANT
RETRACTOR WND ALEXIS 18 MED (MISCELLANEOUS) ×1 IMPLANT
RTRCTR WOUND ALEXIS 18CM MED (MISCELLANEOUS) ×3
SCREW PINN CAN 6.5X20 (Screw) ×2 IMPLANT
STAPLER SKIN PROX 35W (STAPLE) ×2 IMPLANT
STEM FEMORAL SZ 6MM STD ACTIS (Stem) ×2 IMPLANT
STRIP CLOSURE SKIN 1/2X4 (GAUZE/BANDAGES/DRESSINGS) ×2 IMPLANT
SUT VIC AB 0 CT1 27 (SUTURE) ×3
SUT VIC AB 0 CT1 27XBRD ANBCTR (SUTURE) ×1 IMPLANT
SUT VIC AB 2-0 CT1 27 (SUTURE) ×3
SUT VIC AB 2-0 CT1 TAPERPNT 27 (SUTURE) ×1 IMPLANT
SUT VICRYL 4-0 PS2 18IN ABS (SUTURE) ×3 IMPLANT
SUT VLOC 180 0 24IN GS25 (SUTURE) ×3 IMPLANT
TOWEL GREEN STERILE (TOWEL DISPOSABLE) ×6 IMPLANT
TOWEL GREEN STERILE FF (TOWEL DISPOSABLE) ×1 IMPLANT
TRAY CATH 16FR W/PLASTIC CATH (SET/KITS/TRAYS/PACK) IMPLANT
TRAY FOLEY MTR SLVR 16FR STAT (SET/KITS/TRAYS/PACK) IMPLANT
WATER STERILE IRR 1000ML POUR (IV SOLUTION) ×6 IMPLANT

## 2019-06-12 NOTE — Anesthesia Procedure Notes (Signed)
Procedure Name: MAC Date/Time: 06/12/2019 10:20 AM Performed by: Inda Coke, CRNA Pre-anesthesia Checklist: Patient identified, Emergency Drugs available, Suction available, Timeout performed and Patient being monitored Patient Re-evaluated:Patient Re-evaluated prior to induction Oxygen Delivery Method: Simple face mask Induction Type: IV induction Dental Injury: Teeth and Oropharynx as per pre-operative assessment

## 2019-06-12 NOTE — Evaluation (Signed)
Physical Therapy Evaluation Patient Details Name: Anthony Dunlap MRN: [id number] DOB: 03/16/1933 Today's Date: 06/12/2019   History of Present Illness  Pt is an 86 y/o male s/p R THA, direct anterior. PMH includes DM, hepatits, prostate cancer, and osteomyelitis of pelivic region.   Clinical Impression  Pt s/p surgery above with deficits below. Pt unsteady during mobility and required min A with use of RW. Feel pt may require increased assist at d/c, however, unsure if family will be able to provide. Will continue to follow acutely to maximize functional mobility independence and safety.     Follow Up Recommendations Follow surgeon's recommendation for DC plan and follow-up therapies;Supervision/Assistance - 24 hour    Equipment Recommendations  None recommended by PT    Recommendations for Other Services       Precautions / Restrictions Precautions Precautions: Fall Restrictions Weight Bearing Restrictions: Yes RLE Weight Bearing: Weight bearing as tolerated      Mobility  Bed Mobility Overal bed mobility: Needs Assistance Bed Mobility: Supine to Sit     Supine to sit: Min assist     General bed mobility comments: Min A for steadying assist and for RLE assist to sit at EOB. Pt requiring BUE for support to maintain sitting balance.   Transfers Overall transfer level: Needs assistance Equipment used: Rolling walker (2 wheeled) Transfers: Sit to/from [facility] Sit to Stand: Min assist Stand pivot transfers: Min assist       General transfer comment: Pt very shaky during transfer to chair. Cues for sequencing using RW. Min A For steadying throughout.   Ambulation/Gait                Stairs            Wheelchair Mobility    Modified Rankin (Stroke Patients Only)       Balance Overall balance assessment: Needs assistance Sitting-balance support: Feet supported;Bilateral upper extremity supported Sitting balance-Leahy Scale:  Poor Sitting balance - Comments: Reliant on bUE support    Standing balance support: Bilateral upper extremity supported;During functional activity Standing balance-Leahy Scale: Poor Standing balance comment: Reliant on BUE and external support                              Pertinent Vitals/Pain Pain Assessment: Faces Faces Pain Scale: Hurts even more Pain Location: R hip  Pain Descriptors / Indicators: Aching;Operative site guarding Pain Intervention(s): Limited activity within patient's tolerance;Monitored during session;Repositioned    Home Living Family/patient expects to be discharged to:: Private residence Living Arrangements: Spouse/significant other;Other relatives Available Help at Discharge: Family;Available PRN/intermittently Type of Home: House Home Access: Ramped entrance     Home Layout: One level Home Equipment: Bedside commode;Shower seat;Walker - 2 wheels;Cane - single point;Walker - 4 wheels;Grab bars - tub/shower Additional Comments: Wife has dementia     Prior Function Level of Independence: Independent with assistive device(s)         Comments: Uses RW for ambulation      Hand Dominance        Extremity/Trunk Assessment   Upper Extremity Assessment Upper Extremity Assessment: Defer to OT evaluation    Lower Extremity Assessment Lower Extremity Assessment: RLE deficits/detail RLE Deficits / Details: Deficits consistent with post op pain and weankess.     Cervical / Trunk Assessment Cervical / Trunk Assessment: Kyphotic  Communication   Communication: No difficulties  Cognition Arousal/Alertness: Awake/alert Behavior During Therapy: WFL for tasks assessed/performed  Overall Cognitive Status: No family/caregiver present to determine baseline cognitive functioning                                 General Comments: Pt unable to answer some questions during session. When asked who will be helping him, pt kept repeating  "he is" but could not state who he was.       General Comments      Exercises Total Joint Exercises Ankle Circles/Pumps: AROM;Both;10 reps   Assessment/Plan    PT Assessment Patient needs continued PT services  PT Problem List Decreased strength;Decreased balance;Decreased range of motion;Decreased mobility;Decreased cognition;Decreased knowledge of use of DME;Decreased safety awareness;Pain       PT Treatment Interventions DME instruction;Gait training;Stair training;Functional mobility training;Therapeutic activities;Therapeutic exercise;Balance training;Patient/family education    PT Goals (Current goals can be found in the Care Plan section)  Acute Rehab PT Goals Patient Stated Goal: to go home PT Goal Formulation: With patient Time For Goal Achievement: 06/26/19 Potential to Achieve Goals: Good    Frequency 7X/week   Barriers to discharge Decreased caregiver support      Co-evaluation               AM-PAC PT "6 Clicks" Mobility  Outcome Measure Help needed turning from your back to your side while in a flat bed without using bedrails?: A Little Help needed moving from lying on your back to sitting on the side of a flat bed without using bedrails?: A Little Help needed moving to and from a bed to a chair (including a wheelchair)?: A Little Help needed standing up from a chair using your arms (e.g., wheelchair or bedside chair)?: A Little Help needed to walk in hospital room?: A Little Help needed climbing 3-5 steps with a railing? : A Lot 6 Click Score: 17    End of Session Equipment Utilized During Treatment: Gait belt Activity Tolerance: Patient tolerated treatment well Patient left: in chair;with call bell/phone within reach Nurse Communication: Mobility status PT Visit Diagnosis: Unsteadiness on feet (R26.81);Muscle weakness (generalized) (M62.81)    Time: [id number] PT Time Calculation (min) (ACUTE ONLY): 16 min   Charges:   PT Evaluation $PT  Eval Low Complexity: 1 Low          Lou Miner, DPT  Acute Rehabilitation Services  Pager: [phone] Office: [phone]   Rudean Hitt 06/12/2019, 6:32 PM

## 2019-06-12 NOTE — TOC Initial Note (Addendum)
Transition of Care [facility]) - Initial/Assessment Note    Patient Details  Name: Anthony Dunlap MRN: [id number] Date of Birth: 06-18-1933  Transition of [facility]) CM/SW Contact:    Benard Halsted, LCSW Phone Number: 06/12/2019, 4:40 PM  Clinical Narrative:                 CSW received consult for possible home health services at time of discharge. CSW spoke with patient regarding PT recommendation of Home Health PT at time of discharge. Patient reported that he would like home health services and that he has used them before and liked their care but could not remember the agency. He provided permission for CSW to contact his daughter, [name]. [name] reported that patient receives a home health nurse through [facility] and they would like to continue with them. CSW sent referral for review and it was accepted for RN/PT/OT/aide. CSW provided Medicare [place] ratings list. CSW confirmed PCP and address with patient's daughter. No further questions reported at this time.    Expected Discharge Plan: [place] Barriers to Discharge: Continued Medical Work up   Patient Goals and CMS Choice Patient states their goals for this hospitalization and ongoing recovery are:: Get stronger CMS Medicare.gov Compare Post Acute Care list provided to:: Patient(and daughter) Choice offered to / list presented to : Patient, Adult Children  Expected Discharge Plan and Services Expected Discharge Plan: [place] In-house Referral: Clinical Social Work Discharge Planning Services: CM Consult Post Acute Care Choice: [place] arrangements for the past 2 months: Single Family Home Expected Discharge Date: 06/13/19                           [facility] Agency: [facility]) Date [place]: 06/12/19 Time [place]: [phone] Representative spoke with at [place]  Prior Living Arrangements/Services Living arrangements for  the past 2 months: [place] with:: Spouse Patient language and need for interpreter reviewed:: Yes Do you feel safe going back to the place where you live?: Yes      Need for Family Participation in Patient Care: Yes (Comment) Care giver support system in place?: Yes (comment) Current home services: Home PT, Home OT Criminal Activity/Legal Involvement Pertinent to Current Situation/Hospitalization: No - Comment as needed  Activities of Daily Living      Permission Sought/Granted Permission sought to share information with : Facility Sport and exercise psychologist, Family Supports Permission granted to share information with : Yes, Verbal Permission Granted  Share Information with NAME: [name]  Permission granted to share info w AGENCY: [facility]  Permission granted to share info w Relationship: Daughter  Permission granted to share info w Contact Information: [phone]  Emotional Assessment Appearance:: Appears stated age Attitude/Demeanor/Rapport: Engaged Affect (typically observed): Accepting, Appropriate Orientation: : Oriented to Self, Oriented to Place, Oriented to  Time, Oriented to Situation(A little confused) Alcohol / Substance Use: Not Applicable Psych Involvement: No (comment)  Admission diagnosis:  Arthritis of right hip [M16.11] Patient Active Problem List   Diagnosis Date Noted  . Arthritis of right hip 06/12/2019  . Osteoarthritis of right hip 01/05/2019  . Pelvic abscess in male [facility]) 09/11/2018  . Drug-induced hepatitis 06/05/2018  . Calculus of gallbladder without cholecystitis without obstruction   . Fungemia/Candida albicans 05/10/2018  . UTI (urinary tract infection) 05/08/2018  . Complicated UTI (urinary tract infection) 05/08/2018  . 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 Abscess of left thigh   . Pressure injury of skin 02/10/2018  . Enterococcus faecalis infection 01/27/2018  . Abscess   . 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 Sepsis due to Bacteroides species ([place]) 01/22/2018  . Sepsis without acute organ dysfunction ([place])   . DKA (diabetic ketoacidoses) ([place]) 01/17/2018  . Normocytic anemia 01/17/2018  . Chronic anemia 01/03/2018  . Chronic indwelling Foley catheter 01/03/2018  . Elevated alkaline phosphatase level 01/03/2018  . Hypothyroidism 01/03/2018  . Physical deconditioning 01/03/2018  . 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 HLD (hyperlipidemia) 08/19/2013  . Type 2 diabetes mellitus ([place])   . Prostate cancer [facility])    PCP:  [name], MD Pharmacy:   [facility], [address] AT [place] [place] [address] Phone: [phone] Fax: [phone]     Social Determinants of Health (SDOH) Interventions    Readmission Risk Interventions Readmission Risk Prevention Plan 05/09/2018  Transportation Screening Complete  PCP or Specialist Appt within 3-5 Days Complete  HRI or [place] Complete  Social Work Consult for [place] Planning/Counseling Complete  Palliative Care Screening Not Applicable  Medication Review Press photographer) Complete  Some recent data might be hidden

## 2019-06-12 NOTE — Interval H&P Note (Signed)
History and Physical Interval Note:  06/12/2019 9:33 AM  [name]  has presented today for surgery, with the diagnosis of right hip osteoarthritis.  The various methods of treatment have been discussed with the patient and family. After consideration of risks, benefits and other options for treatment, the patient has consented to  Procedure(s): RIGHT TOTAL HIP ARTHROPLASTY DIRECT ANTERIOR (Right) as a surgical intervention.  The patient's history has been reviewed, patient examined, no change in status, stable for surgery.  I have reviewed the patient's chart and labs.  Questions were answered to the patient's satisfaction.     Marybelle Killings Discussed again increased risks of infection which he also has discussed in detail with ID team MD. He understands that if he develops hip infection then prothesis could have to be removed and might not be able to sucessfully reinplant and he would not have a hip . He understands and agrees to proceed.

## 2019-06-12 NOTE — Discharge Instructions (Addendum)
INSTRUCTIONS AFTER JOINT REPLACEMENT     YOU ARE ON AUGEMENTIN 875  ONE PO TWICE A DAY FOR LIFE. DO NOT STOP THIS MEDICATION.  o Remove items at home which could result in a fall. This includes throw rugs or furniture in walking pathways o ICE to the affected joint every three hours while awake for 30 minutes at a time, for at least the first 3-5 days, and then as needed for pain and swelling.  Continue to use ice for pain and swelling. You may notice swelling that will progress down to the foot and ankle.  This is normal after surgery.  Elevate your leg when you are not up walking on it.   o Continue to use the breathing machine you got in the hospital (incentive spirometer) which will help keep your temperature down.  It is common for your temperature to cycle up and down following surgery, especially at night when you are not up moving around and exerting yourself.  The breathing machine keeps your lungs expanded and your temperature down.   DIET:  As you were doing prior to hospitalization, we recommend a well-balanced diet.  DRESSING / WOUND CARE / SHOWERING     No tub soaking.  You can leave your dressing on until you return to see dr. Lorin Mercy in one week.        ACTIVITY  o Increase activity slowly as tolerated, but follow the weight bearing instructions below.   o No driving for 6 weeks or until further direction given by your physician.  You cannot drive while taking narcotics.  o No lifting or carrying greater than 10 lbs. until further directed by your surgeon. o Avoid periods of inactivity such as sitting longer than an hour when not asleep. This helps prevent blood clots.  o You may return to work once you are authorized by your doctor.     WEIGHT BEARING   Weight bearing as tolerated with assist device (walker, cane, etc) as directed, use it as long as suggested by your surgeon or therapist, typically at least 4-6 weeks.   EXERCISES  Results after joint replacement surgery  are often greatly improved when you follow the exercise, range of motion and muscle strengthening exercises prescribed by your doctor. Safety measures are also important to protect the joint from further injury. Any time any of these exercises cause you to have increased pain or swelling, decrease what you are doing until you are comfortable again and then slowly increase them. If you have problems or questions, call your caregiver or physical therapist for advice.   Rehabilitation is important following a joint replacement. After just a few days of immobilization, the muscles of the leg can become weakened and shrink (atrophy).  These exercises are designed to build up the tone and strength of the thigh and leg muscles and to improve motion. Often times heat used for twenty to thirty minutes before working out will loosen up your tissues and help with improving the range of motion but do not use heat for the first two weeks following surgery (sometimes heat can increase post-operative swelling).   These exercises can be done on a training (exercise) mat, on the floor, on a table or on a bed. Use whatever works the best and is most comfortable for you.    Use music or television while you are exercising so that the exercises are a pleasant break in your day. This will make your life better with the exercises  acting as a break in your routine that you can look forward to.   Perform all exercises about fifteen times, three times per day or as directed.  You should exercise both the operative leg and the other leg as well.  Exercises include:   . Quad Sets - Tighten up the muscle on the front of the thigh (Quad) and hold for 5-10 seconds.   . Straight Leg Raises - With your knee straight (if you were given a brace, keep it on), lift the leg to 60 degrees, hold for 3 seconds, and slowly lower the leg.  Perform this exercise against resistance later as your leg gets stronger.  . Leg Slides: Lying on your back,  slowly slide your foot toward your buttocks, bending your knee up off the floor (only go as far as is comfortable). Then slowly slide your foot back down until your leg is flat on the floor again.  [place] Haring Wings: Lying on your back spread your legs to the side as far apart as you can without causing discomfort.  . Hamstring Strength:  Lying on your back, push your heel against the floor with your leg straight by tightening up the muscles of your buttocks.  Repeat, but this time bend your knee to a comfortable angle, and push your heel against the floor.  You may put a pillow under the heel to make it more comfortable if necessary.   A rehabilitation program following joint replacement surgery can speed recovery and prevent re-injury in the future due to weakened muscles. Contact your doctor or a physical therapist for more information on knee rehabilitation.    CONSTIPATION  Constipation is defined medically as fewer than three stools per week and severe constipation as less than one stool per week.  Even if you have a regular bowel pattern at home, your normal regimen is likely to be disrupted due to multiple reasons following surgery.  Combination of anesthesia, postoperative narcotics, change in appetite and fluid intake all can affect your bowels.   YOU MUST use at least one of the following options; they are listed in order of increasing strength to get the job done.  They are all available over the counter, and you may need to use some, POSSIBLY even all of these options:    Drink plenty of fluids (prune juice may be helpful) and high fiber foods Colace 100 mg by mouth twice a day  Senokot for constipation as directed and as needed Dulcolax (bisacodyl), take with full glass of water  Miralax (polyethylene glycol) once or twice a day as needed.  If you have tried all these things and are unable to have a bowel movement in the first 3-4 days after surgery call either your surgeon or your  primary doctor.    If you experience loose stools or diarrhea, hold the medications until you stool forms back up.  If your symptoms do not get better within 1 week or if they get worse, check with your doctor.  If you experience "the worst abdominal pain ever" or develop nausea or vomiting, please contact the office immediately for further recommendations for treatment.   ITCHING:  If you experience itching with your medications, try taking only a single pain pill, or even half a pain pill at a time.  You can also use Benadryl over the counter for itching or also to help with sleep.   TED HOSE STOCKINGS:  Use stockings on both legs until for at least  2 weeks or as directed by physician office. They may be removed at night for sleeping.  MEDICATIONS:  See your medication summary on the "After Visit Summary" that nursing will review with you.  You may have some home medications which will be placed on hold until you complete the course of blood thinner medication.  It is important for you to complete the blood thinner medication as prescribed.  PRECAUTIONS:  If you experience chest pain or shortness of breath - call 911 immediately for transfer to the hospital emergency department.   If you develop a fever greater that 101 F, purulent drainage from wound, increased redness or drainage from wound, foul odor from the wound/dressing, or calf pain - CONTACT YOUR SURGEON.                                                   FOLLOW-UP APPOINTMENTS:  If you do not already have a post-op appointment, please call the office for an appointment to be seen by your surgeon.  Guidelines for how soon to be seen are listed in your "After Visit Summary", but are typically between 1-4 weeks after surgery.  OTHER INSTRUCTIONS:   Knee Replacement:  Do not place pillow under knee, focus on keeping the knee straight while resting. CPM instructions: 0-90 degrees, 2 hours in the morning, 2 hours in the afternoon, and 2 hours  in the evening. Place foam block, curve side up under heel at all times except when in CPM or when walking.  DO NOT modify, tear, cut, or change the foam block in any way.   DENTAL ANTIBIOTICS:  In most cases prophylactic antibiotics for Dental procdeures after total joint surgery are not necessary.  Exceptions are as follows:  1. History of prior total joint infection  2. Severely immunocompromised (Organ Transplant, cancer chemotherapy, Rheumatoid biologic meds such as [place])  3. Poorly controlled diabetes (A1C &gt; 8.0, blood glucose over 200)  If you have one of these conditions, contact your surgeon for an antibiotic prescription, prior to your dental procedure.   MAKE SURE YOU:  . Understand these instructions.  . Get help right away if you are not doing well or get worse.    Thank you for letting us be a part of your medical care team.  It is a privilege we respect greatly.  We hope these instructions will help you stay on track for a fast and full recovery!

## 2019-06-12 NOTE — Op Note (Signed)
Preop diagnosis: Right hip primary osteoarthritis.  Postop diagnosis: Same  Procedure: Right total hip arthroplasty, direct anterior approach  Surgeon: Rodell Perna, MD  Assistant: Benjiman Core, PA-C medically necessary and present for the entire procedure  Anesthesia: Spinal plus Exparel 10 cc Marcaine 10 cc Exparel equals 20 total.  EBL: 200 cc  Implants: 54 mm Depuy Gription cup 54x30 6+4 neutral liner.  20 mm single acetabular screw.  +5 metal ball.  Size 6 Actis standard stem.  Procedure: After induction of spinal anesthesia patient placed on the Hana table after Hana boots.  He already had a Foley chronically indwelling.  Patient had past history of osteomyelitis of the symphysis pubis growing multiple organisms and had been followed closely by the ID team who referred him for total hip arthroplasty due to his severe hip destruction and hip pain.  Patient was placed on the Hana table careful padding positioning C arm was brought in good visualization of both hips.  Due to the severe erosive deformity and arthritis he was about 1 cm short.  1015 drapes were applied followed by DuraPrep sterile skin marker large sharp curtain Betadine Steri-Drape.  Patient was then and the skin protector was not needed.  Timeout procedure was completed Ancef was given prophylactically.  Patient is on chronically Augmentin 875 1 p.o. twice daily forever.  Incision was made 1 fingerbreadth lateral 1 inferior to the ASIS obliquely to the trochanter fascia was identified nicked extended.  Cobra retractor placed over the top of the capsule over the medial neck and transverse bleeders were coagulated.  Capsule was opened there was some shortening of the neck due to the severe work wear of the head and thickening of the capsule.  C-arm was used to confirm neck cut and after neck cut was made we proceeded to try to remove the head.  The head was so deformed out of around flattened he only had about 10 degrees of  external rotation and no internal rotation preoperatively.  Removed some of the head using an osteotome and then the remainder progressively reamed up checking with C arm to make sure in the right position until just 1 to 2 mm of bone was left with the erosive subchondral bone and then the head was able to be grabbed with the rondure and pulled out.  We then progressed reaming up to a 53.54 cup place with a single screw 20 mm in the dome checked under x-ray solidly in bone.  Cup was secured and cup flexion and abduction was confirmed with x-ray.  +4 neutral liner was inserted hydraulic arm was applied leg was taken and external rotation 120 degrees down and under and then standard preparation of the femur was performed with the Mueller retractor medial and large trochanteric clamp behind the greater trochanter.  We progressed up to #6 initially a 5 with a +0 neck appeared short we moved up to 6 size broach which filled the canal on AP and lateral with height and +5 neck length restored leg length height.  Printed #16 was inserted +5 metal ball after irrigation.  Stability was good with extension to 45 degrees external rotation 90 degrees no subluxation.  There was only trace shock.  Leg lengths were equal by x-ray measurement.  Good fit and fill of the canal.  We recoagulated the transverse bleeders.  V lock closure of the fascia 2 on the subtenons tissue skin staple closure postop dressing and transferred to care room in stable condition.  Patient  received 2 doses Ancef due to his past history of chronic osteo and will continue on his chronic Augmentin 875 1 p.o. twice daily for now on.

## 2019-06-12 NOTE — Transfer of Care (Signed)
Immediate Anesthesia Transfer of Care Note  Patient: Anthony Dunlap  Procedure(s) Performed: RIGHT TOTAL HIP ARTHROPLASTY DIRECT ANTERIOR (Right Hip)  Patient Location: PACU  Anesthesia Type:MAC and Spinal  Level of Consciousness: awake, alert  and oriented  Airway & Oxygen Therapy: Patient Spontanous Breathing  Post-op Assessment: Report given to RN and Post -op Vital signs reviewed and stable  Post vital signs: Reviewed and stable  Last Vitals:  Vitals Value Taken Time  BP    Temp    Pulse    Resp    SpO2      Last Pain:  Vitals:   06/12/19 0835  TempSrc:   PainSc: 0-No pain         Complications: No apparent anesthesia complications

## 2019-06-13 LAB — BASIC METABOLIC PANEL
Anion gap: 10 (ref 5–15)
BUN: 22 mg/dL (ref 8–23)
CO2: 23 mmol/L (ref 22–32)
Calcium: 8.7 mg/dL — ABNORMAL LOW (ref 8.9–10.3)
Chloride: 100 mmol/L (ref 98–111)
Creatinine, Ser: 1.32 mg/dL — ABNORMAL HIGH (ref 0.61–1.24)
GFR calc Af Amer: 57 mL/min — ABNORMAL LOW (ref >60)
GFR calc non Af Amer: 49 mL/min — ABNORMAL LOW (ref >60)
Glucose, Bld: 322 mg/dL — ABNORMAL HIGH (ref 70–99)
Potassium: 4.4 mmol/L (ref 3.5–5.1)
Sodium: 133 mmol/L — ABNORMAL LOW (ref 135–145)

## 2019-06-13 LAB — CBC
HCT: 26.3 % — ABNORMAL LOW (ref 39.0–52.0)
Hemoglobin: 8.4 g/dL — ABNORMAL LOW (ref 13.0–17.0)
MCH: 28.1 pg (ref 26.0–34.0)
MCHC: 31.9 g/dL (ref 30.0–36.0)
MCV: 88 fL (ref 80.0–100.0)
Platelets: 187 10*3/uL (ref 150–400)
RBC: 2.99 MIL/uL — ABNORMAL LOW (ref 4.22–5.81)
RDW: 12.5 % (ref 11.5–15.5)
WBC: 7.3 10*3/uL (ref 4.0–10.5)
nRBC: 0 % (ref 0.0–0.2)

## 2019-06-13 LAB — GLUCOSE, CAPILLARY
Glucose-Capillary: 313 mg/dL — ABNORMAL HIGH (ref 70–99)
Glucose-Capillary: 237 mg/dL — ABNORMAL HIGH (ref 70–99)
Glucose-Capillary: 293 mg/dL — ABNORMAL HIGH (ref 70–99)
Glucose-Capillary: 344 mg/dL — ABNORMAL HIGH (ref 70–99)

## 2019-06-13 MED ORDER — INSULIN ASPART 100 UNIT/ML ~~LOC~~ SOLN
0.0000 [IU] | Freq: Three times a day (TID) | SUBCUTANEOUS | Status: DC
  Administered 2019-06-13: 17:00:00 8 [IU] via SUBCUTANEOUS
  Administered 2019-06-14: 3 [IU] via SUBCUTANEOUS
  Administered 2019-06-14: 5 [IU] via SUBCUTANEOUS
  Administered 2019-06-14 – 2019-06-15 (×4): 3 [IU] via SUBCUTANEOUS
  Administered 2019-06-16: 13:00:00 5 [IU] via SUBCUTANEOUS
  Administered 2019-06-16: 09:00:00 3 [IU] via SUBCUTANEOUS

## 2019-06-13 MED ORDER — OXYCODONE HCL 5 MG PO TABS
5.0000 mg | ORAL_TABLET | ORAL | Status: DC | PRN
  Administered 2019-06-13 – 2019-06-16 (×2): 5 mg via ORAL
  Filled 2019-06-13 (×2): qty 1

## 2019-06-13 MED ORDER — CHLORHEXIDINE GLUCONATE CLOTH 2 % EX PADS
6.0000 | MEDICATED_PAD | Freq: Every day | CUTANEOUS | Status: DC
  Administered 2019-06-13 – 2019-06-16 (×4): 6 via TOPICAL

## 2019-06-13 MED ORDER — INSULIN ASPART 100 UNIT/ML ~~LOC~~ SOLN
0.0000 [IU] | Freq: Every day | SUBCUTANEOUS | Status: DC
  Administered 2019-06-13: 23:00:00 4 [IU] via SUBCUTANEOUS
  Administered 2019-06-14: 22:00:00 3 [IU] via SUBCUTANEOUS

## 2019-06-13 NOTE — Progress Notes (Signed)
Physical Therapy Treatment Patient Details Name: Anthony Dunlap MRN: [id number] DOB: [birth year] Today's Date: 06/13/2019    History of Present Illness Pt is an 86 y/o male s/p R THA, direct anterior. PMH includes DM, hepatits, prostate cancer, and osteomyelitis of pelivic region.     PT Comments    Patient seen for mobility progression. Pt in recliner upon arrival and agreeable to participate in therapy. Pt requires +2 assist this session for functional transfer/gait training. Pt with posterior bias when attempting to stand and then in standing. Pt tolerated gait distance of 40 ft with assistance for balance and managing RW (chair follow for safety). PT will continue to follow acutely and progress as tolerated.     Follow Up Recommendations  Follow surgeon's recommendation for DC plan and follow-up therapies;Supervision/Assistance - 24 hour     Equipment Recommendations  None recommended by PT    Recommendations for Other Services       Precautions / Restrictions Precautions Precautions: Fall Restrictions Weight Bearing Restrictions: Yes RLE Weight Bearing: Weight bearing as tolerated    Mobility  Bed Mobility Overal bed mobility: Needs Assistance Bed Mobility: Supine to Sit     Supine to sit: Max assist     General bed mobility comments: pt OOB in chair upon arrival  Transfers Overall transfer level: Needs assistance Equipment used: Rolling walker (2 wheeled) Transfers: Sit to/from [facility] Sit to Stand: Mod assist;Max assist;+2 physical assistance;+2 safety/equipment Stand pivot transfers: Min assist       General transfer comment: cues for positioning and hand placement prior to standing; pt with difficulty with trunk flexion despite multimodal cues and with posterior bias when standing increasing difficulty; this therapist unable to stand pt and required second person to stand X 2 trials from recliner (recliner built up with pillow); second  stand less assist required; assistance required to gain balance in standing and multimodal cues required to achieve upright posture as knees and hips flexed  Ambulation/Gait Ambulation/Gait assistance: Mod assist;+2 safety/equipment(chair follow) Gait Distance (Feet): 40 Feet Assistive device: Rolling walker (2 wheeled) Gait Pattern/deviations: Step-through pattern;Decreased step length - right;Decreased step length - left;Trunk flexed Gait velocity: decreased   General Gait Details: max cues for safe use of AD and upright posture; pt keeps RW way too far forward to assist in gait or safety; physical assistance required for balance and managing RW    Stairs             Wheelchair Mobility    Modified Rankin (Stroke Patients Only)       Balance Overall balance assessment: Needs assistance Sitting-balance support: Feet supported;Bilateral upper extremity supported Sitting balance-Leahy Scale: Poor Sitting balance - Comments: Reliant on bUE support    Standing balance support: Bilateral upper extremity supported;During functional activity Standing balance-Leahy Scale: Poor Standing balance comment: Reliant on BUE and external support                             Cognition Arousal/Alertness: Awake/alert Behavior During Therapy: WFL for tasks assessed/performed Overall Cognitive Status: No family/caregiver present to determine baseline cognitive functioning                                 General Comments: Pt with vague responses and difficulty following commands.      Exercises      General Comments General comments (skin integrity, edema,  etc.): Pt's family report per chart to want pt to return home. pt with severe posterior lean in sitting and when attempting to stand, thus pt a high fall risk and requiring increased assist for mobility.      Pertinent Vitals/Pain Pain Assessment: Faces Faces Pain Scale: Hurts little more Pain Location: R  hip  Pain Descriptors / Indicators: Operative site guarding Pain Intervention(s): Limited activity within patient's tolerance;Monitored during session;Repositioned    Home Living Family/patient expects to be discharged to:: Private residence Living Arrangements: Spouse/significant other;Other relatives Available Help at Discharge: Family;Available PRN/intermittently Type of Home: House Home Access: Ramped entrance   Home Layout: One level Home Equipment: Bedside commode;Shower seat;Walker - 2 wheels;Cane - single point;Walker - 4 wheels;Grab bars - tub/shower Additional Comments: Wife has dementia     Prior Function Level of Independence: Independent with assistive device(s)      Comments: Uses RW for ambulation    PT Goals (current goals can now be found in the care plan section) Acute Rehab PT Goals Patient Stated Goal: to go home Progress towards PT goals: Progressing toward goals    Frequency    7X/week      PT Plan Current plan remains appropriate    Co-evaluation              AM-PAC PT "6 Clicks" Mobility   Outcome Measure  Help needed turning from your back to your side while in a flat bed without using bedrails?: A Little Help needed moving from lying on your back to sitting on the side of a flat bed without using bedrails?: A Lot Help needed moving to and from a bed to a chair (including a wheelchair)?: A Lot Help needed standing up from a chair using your arms (e.g., wheelchair or bedside chair)?: A Lot Help needed to walk in hospital room?: A Lot Help needed climbing 3-5 steps with a railing? : Total 6 Click Score: 12    End of Session Equipment Utilized During Treatment: Gait belt Activity Tolerance: Patient tolerated treatment well Patient left: in chair;with call bell/phone within reach;with nursing/sitter in room Nurse Communication: Mobility status PT Visit Diagnosis: Unsteadiness on feet (R26.81);Muscle weakness (generalized) (M62.81)      Time: [id number] PT Time Calculation (min) (ACUTE ONLY): 20 min  Charges:  $Gait Training: 8-22 mins                     Earney Navy, PTA Acute Rehabilitation Services Pager: [phone] Office: [phone]     Darliss Cheney 06/13/2019, 10:18 AM

## 2019-06-13 NOTE — Progress Notes (Signed)
   Subjective:  Patient reports pain as mild.  Objective:   VITALS:   Vitals:   06/12/19 1951 06/12/19 2327 06/13/19 0400 06/13/19 0716  BP: (!) 141/69 127/71 (!) 143/68 98/69  Pulse: (!) 115 (!) 107 (!) 103 (!) 110  Resp: 18 18 17 16   Temp: 97.6 F (36.4 C) 99.6 F (37.6 C) 99.1 F (37.3 C) 97.6 F (36.4 C)  TempSrc: Oral Oral Oral Oral  SpO2: 99% 96% 93% 93%  Weight:      Height:        Neurologically intact Neurovascular intact Sensation intact distally Intact pulses distally Dorsiflexion/Plantar flexion intact Incision: dressing C/D/I and no drainage No cellulitis present Compartment soft slightly unsteady on his feet   Lab Results  Component Value Date   WBC 7.3 06/13/2019   HGB 8.4 (L) 06/13/2019   HCT 26.3 (L) 06/13/2019   MCV 88.0 06/13/2019   PLT 187 06/13/2019     Assessment/Plan:  1 Day Post-Op   - Expected postop acute blood loss anemia - will monitor for symptoms - Up with PT/OT - may need  - DVT ppx - SCDs, ambulation, aspirin - WBAT operative extremity - Pain control - will need to see how patient progresses with PT/OT, may need 1 more day to make sure he's safe to go home  Eduard Roux 06/13/2019, 8:51 AM [phone]

## 2019-06-13 NOTE — Progress Notes (Signed)
Physical Therapy Treatment Patient Details Name: Anthony Dunlap MRN: [id number] DOB: 06/07/1933 Today's Date: 06/13/2019    History of Present Illness Pt is an 86 y/o male s/p R THA, direct anterior. PMH includes DM, hepatits, prostate cancer, and osteomyelitis of pelivic region.     PT Comments    Patient seen second time for mobility progression. Pt in bed upon arrival and requires mod A for supine to sit. Pt continues to present with posterior bias in sitting and standing and requires +2 assist to stand from EOB and recliner. Pt tolerated increased gait distance this session with use of RW and min-mod A for balance and managing RW. Given pt's current mobility he is at high risk for falls and will continue to benefit from further skilled PT services to maximize independence and safety with mobility.    Follow Up Recommendations  Follow surgeon's recommendation for DC plan and follow-up therapies;Supervision/Assistance - 24 hour     Equipment Recommendations  None recommended by PT    Recommendations for Other Services       Precautions / Restrictions Precautions Precautions: Fall Restrictions Weight Bearing Restrictions: Yes RLE Weight Bearing: Weight bearing as tolerated    Mobility  Bed Mobility Overal bed mobility: Needs Assistance Bed Mobility: Supine to Sit     Supine to sit: Mod assist     General bed mobility comments: multimodal cues for sequencing however pt unable to problem solve how to get EOB; use of rails at times; assistance required to bring R LE to EOB and ultimately assistance to elevate trunk and scoot hips to EOB  Transfers Overall transfer level: Needs assistance Equipment used: Rolling walker (2 wheeled) Transfers: Sit to/from [facility] Sit to Stand: Mod assist;+2 physical assistance;+2 safety/equipment;From elevated surface         General transfer comment: pt stood from elevate bed height with +2 assist and increased assist  from recliner; cues for positioning (grossly trunk flexion) and safe hand placement; pt continues to have posterior bias   Ambulation/Gait Ambulation/Gait assistance: Mod assist;+2 safety/equipment;Min assist(chair follow) Gait Distance (Feet): (60 ft then ~40 ft with seated rest break) Assistive device: Rolling walker (2 wheeled) Gait Pattern/deviations: Step-through pattern;Trunk flexed;Decreased stance time - right;Decreased step length - right;Decreased step length - left;Decreased dorsiflexion - right;Antalgic Gait velocity: decreased   General Gait Details: very flexed trunk especially when fatigued; max cues and assistance to maintain safe proximity to RW; cues for safe gait speed (tendency to rush), upright posture, and increased R step length; pt with difficulty advancing R LE at times    Stairs             Wheelchair Mobility    Modified Rankin (Stroke Patients Only)       Balance Overall balance assessment: Needs assistance Sitting-balance support: Feet supported;Bilateral upper extremity supported Sitting balance-Leahy Scale: Poor   Postural control: Posterior lean Standing balance support: Bilateral upper extremity supported;During functional activity Standing balance-Leahy Scale: Poor                              Cognition Arousal/Alertness: Awake/alert Behavior During Therapy: WFL for tasks assessed/performed Overall Cognitive Status: No family/caregiver present to determine baseline cognitive functioning                                 General Comments: pt with decreased safety awareness; required encouragement  to take rest break when clearly fatigued       Exercises      General Comments        Pertinent Vitals/Pain Pain Assessment: Faces Faces Pain Scale: Hurts little more Pain Location: R hip  Pain Descriptors / Indicators: Operative site guarding Pain Intervention(s): Limited activity within patient's  tolerance;Monitored during session;Repositioned    Home Living                      Prior Function            PT Goals (current goals can now be found in the care plan section) Acute Rehab PT Goals Patient Stated Goal: to go home Progress towards PT goals: Progressing toward goals    Frequency    7X/week      PT Plan Current plan remains appropriate    Co-evaluation              AM-PAC PT "6 Clicks" Mobility   Outcome Measure  Help needed turning from your back to your side while in a flat bed without using bedrails?: A Lot Help needed moving from lying on your back to sitting on the side of a flat bed without using bedrails?: A Lot Help needed moving to and from a bed to a chair (including a wheelchair)?: A Lot Help needed standing up from a chair using your arms (e.g., wheelchair or bedside chair)?: A Lot Help needed to walk in hospital room?: A Lot Help needed climbing 3-5 steps with a railing? : Total 6 Click Score: 11    End of Session Equipment Utilized During Treatment: Gait belt Activity Tolerance: Patient tolerated treatment well Patient left: in chair;with call bell/phone within reach;with chair alarm set Nurse Communication: Mobility status PT Visit Diagnosis: Unsteadiness on feet (R26.81);Muscle weakness (generalized) (M62.81)     Time: 1141-1200 PT Time Calculation (min) (ACUTE ONLY): 19 min  Charges:  $Gait Training: 8-22 mins                     Earney Navy, PTA Acute Rehabilitation Services Pager: [phone] Office: [phone]     Darliss Cheney 06/13/2019, 3:57 PM

## 2019-06-13 NOTE — Evaluation (Signed)
Occupational Therapy Evaluation Patient Details Name: Anthony Dunlap MRN: [id number] DOB: February 12, 1934 Today's Date: 06/13/2019    History of Present Illness Pt is an 87 y/o male s/p R THA, direct anterior. PMH includes DM, hepatits, prostate cancer, and osteomyelitis of pelivic region.    Clinical Impression    Pt PTA: Living at home with spouse with dementia. Pt unable to describe assist level at home for him versus his spouse. Pt with severely difficulty with keeping anterior lean; pt with severe posterior lean in sitting and to push off in standing. Pt unable to verbalize what was causing the posterior lean- dizziness, pain, esc. Pt takes shuffled steps to recliner. AE education began, but pt requiring additional education. Pt maxA overall for ADL. Pt using reacher to donn pants with modA; pt maxA for pulling to waist in standing. Pt unable to perform safely.  Pt reports to have a lift chair, but does not appear safe for transfers or maneuvering. Pt would greatly benefit from continued OT skilled services. OT following acutely.  ** Pt has lift chair at home; pt requiring a significant amount of assist for bed mobility, transfers and mobility. Pt is a high fall risk due to severe posterior lean and weakness. Please consider 24/7 physical assist at home for pt with [facility] therapies if pt cannot go to post acute care therapy setting.    Follow Up Recommendations  Follow surgeon's recommendation for DC plan and follow-up therapies;Supervision/Assistance - 24 hour    Equipment Recommendations  None recommended by OT    Recommendations for Other Services       Precautions / Restrictions Precautions Precautions: Fall Restrictions Weight Bearing Restrictions: Yes RLE Weight Bearing: Weight bearing as tolerated      Mobility Bed Mobility Overal bed mobility: Needs Assistance Bed Mobility: Supine to Sit     Supine to sit: Max assist     General bed mobility comments: Pt using rails, HOB  elevated; pt with increased posterior lean and unable to perform task without maxA for trunk stability to come to EOB. Pt continued to have posterior lean with EOB sitting.  Transfers Overall transfer level: Needs assistance Equipment used: Rolling walker (2 wheeled) Transfers: Sit to/from [facility] Sit to Stand: Min assist Stand pivot transfers: Min assist       General transfer comment: ModA for transfer to recliner with RW, shuffled steps.     Balance Overall balance assessment: Needs assistance Sitting-balance support: Feet supported;Bilateral upper extremity supported Sitting balance-Leahy Scale: Poor Sitting balance - Comments: Reliant on bUE support    Standing balance support: Bilateral upper extremity supported;During functional activity Standing balance-Leahy Scale: Poor Standing balance comment: Reliant on BUE and external support                            ADL either performed or assessed with clinical judgement   ADL Overall ADL's : Needs assistance/impaired Eating/Feeding: Set up;Sitting   Grooming: Set up;Sitting Grooming Details (indicate cue type and reason): tremors Upper Body Bathing: Minimal assistance;Sitting   Lower Body Bathing: Maximal assistance;Sitting/lateral leans;Sit to/from stand;With adaptive equipment   Upper Body Dressing : Minimal assistance;Sitting   Lower Body Dressing: Maximal assistance;Sitting/lateral leans;Sit to/from stand;With adaptive equipment;Cueing for safety;Cueing for sequencing Lower Body Dressing Details (indicate cue type and reason): Pt using reacher to donn pants with modA; pt maxA for pulling to waist in standing. Pt unable to perform safely. Pt with severe posterior lean and  unable to successfully sit at EOB/edge of chair without assist and without falling back into recliner. Pt unable to verbalize what was causing the posterior lean- dizziness, pain, esc. Toilet Transfer: Moderate  assistance;Stand-pivot;BSC   Toileting- Clothing Manipulation and Hygiene: Maximal assistance;Cueing for safety;Cueing for sequencing;Sitting/lateral lean;Sit to/from stand       Functional mobility during ADLs: Moderate assistance;Rolling walker;Cueing for safety;Cueing for sequencing General ADL Comments: Pt with severely difficulty with keeping anterior lean;pt with severe posterior lean in sitting and to push off in standing. Pt takes shuffled steps to recliner. AE education began, but pt requiring additional education. Pt reports to have a lift chair, but does not appear safe for transfers or maneuvering.      Vision Baseline Vision/History: No visual deficits Patient Visual Report: No change from baseline Vision Assessment?: No apparent visual deficits     Perception     Praxis      Pertinent Vitals/Pain Pain Assessment: Faces Faces Pain Scale: Hurts even more Pain Location: R hip  Pain Descriptors / Indicators: Aching;Operative site guarding Pain Intervention(s): Monitored during session     Hand Dominance Right   Extremity/Trunk Assessment Upper Extremity Assessment Upper Extremity Assessment: Generalized weakness   Lower Extremity Assessment Lower Extremity Assessment: Generalized weakness;RLE deficits/detail RLE Deficits / Details: Deficits consistent with post op pain and weankess.  RLE Coordination: decreased gross motor   Cervical / Trunk Assessment Cervical / Trunk Assessment: Kyphotic   Communication Communication Communication: No difficulties   Cognition Arousal/Alertness: Awake/alert Behavior During Therapy: WFL for tasks assessed/performed Overall Cognitive Status: No family/caregiver present to determine baseline cognitive functioning                                 General Comments: Pt with vague responses and difficulty following commands.   General Comments  Pt's family report per chart to want pt to return home. pt with severe  posterior lean in sitting and when attempting to stand, thus pt a high fall risk and requiring increased assist for mobility.    Exercises     Shoulder Instructions      Home Living Family/patient expects to be discharged to:: Private residence Living Arrangements: Spouse/significant other;Other relatives Available Help at Discharge: Family;Available PRN/intermittently Type of Home: House Home Access: Ramped entrance     Home Layout: One level     Bathroom Shower/Tub: Occupational psychologist: Standard     Home Equipment: Bedside commode;Shower seat;Walker - 2 wheels;Cane - single point;Walker - 4 wheels;Grab bars - tub/shower   Additional Comments: Wife has dementia       Prior Functioning/Environment Level of Independence: Independent with assistive device(s)        Comments: Uses RW for ambulation         OT Problem List: Decreased strength;Decreased range of motion;Decreased activity tolerance;Impaired balance (sitting and/or standing);Decreased cognition;Decreased safety awareness;Decreased knowledge of use of DME or AE;Pain;Increased edema      OT Treatment/Interventions: Self-care/ADL training;Therapeutic exercise;Energy conservation;DME and/or AE instruction;Therapeutic activities;Cognitive remediation/compensation;Patient/family education;Balance training    OT Goals(Current goals can be found in the care plan section) Acute Rehab OT Goals Patient Stated Goal: to go home OT Goal Formulation: With patient Time For Goal Achievement: 06/27/19 Potential to Achieve Goals: Good ADL Goals Pt Will Perform Grooming: with min guard assist;standing Pt Will Perform Lower Body Dressing: sitting/lateral leans;sit to/from stand;with adaptive equipment;with min assist Pt Will Transfer to Toilet: with min  guard assist;ambulating;bedside commode Additional ADL Goal #1: Pt will increase standing tolerance x3 mins for increasing activity tolerance for OOB  ADL Additional ADL Goal #2: Pt will increase sit to stands to minA overall with proper technique to avoid posterior lean with minimal cues.  OT Frequency: Min 2X/week   Barriers to D/C:            Co-evaluation              AM-PAC OT "6 Clicks" Daily Activity     Outcome Measure Help from another person eating meals?: A Little Help from another person taking care of personal grooming?: A Little Help from another person toileting, which includes using toliet, bedpan, or urinal?: A Lot Help from another person bathing (including washing, rinsing, drying)?: A Lot Help from another person to put on and taking off regular upper body clothing?: A Little Help from another person to put on and taking off regular lower body clothing?: A Lot 6 Click Score: 15   End of Session Equipment Utilized During Treatment: Gait belt;Rolling walker Nurse Communication: Mobility status;Precautions;Weight bearing status;Other (comment)(Pt possible need for placement or 24/7 care for pt at d/c)  Activity Tolerance: Patient limited by pain;Patient limited by fatigue Patient left: in chair;with call bell/phone within reach  OT Visit Diagnosis: Unsteadiness on feet (R26.81);Muscle weakness (generalized) (M62.81);Pain;Other symptoms and signs involving cognitive function Pain - Right/Left: Right Pain - part of body: Hip                Time: 0820-0910 OT Time Calculation (min): 50 min Charges:  OT General Charges $OT Visit: 1 Visit OT Evaluation $OT Eval Moderate Complexity: 1 Mod OT Treatments $Self Care/Home Management : 8-22 mins $Therapeutic Activity: 8-22 mins  Jefferey Pica, OTR/L Acute Rehabilitation Services Pager: [phone] Office: [phone]   Hamid Brookens C 06/13/2019, 10:04 AM

## 2019-06-13 NOTE — Progress Notes (Signed)
1230 Received pt from 3C via bed, A&O x4. Right hip incision with Mipilex dressing dry and intact. Denies pain at this time.

## 2019-06-13 NOTE — Care Management Obs Status (Signed)
[facility] NOTIFICATION   Patient Details  Name: Anthony Dunlap MRN: [id number] Date of Birth: 04-21-1933   Medicare Observation Status Notification Given:       Verdell Carmine, RN 06/13/2019, 4:09 PM

## 2019-06-13 NOTE — Plan of Care (Signed)
  Problem: Safety: Goal: Ability to remain free from injury will improve Outcome: Adequate for Discharge   Problem: Education: Goal: Knowledge of the prescribed therapeutic regimen will improve Outcome: Adequate for Discharge Goal: Understanding of discharge needs will improve Outcome: Adequate for Discharge

## 2019-06-14 DIAGNOSIS — H4920 Sixth [abducent] nerve palsy, unspecified eye: Secondary | ICD-10-CM | POA: Diagnosis present

## 2019-06-14 DIAGNOSIS — E119 Type 2 diabetes mellitus without complications: Secondary | ICD-10-CM | POA: Diagnosis present

## 2019-06-14 DIAGNOSIS — Z794 Long term (current) use of insulin: Secondary | ICD-10-CM | POA: Diagnosis not present

## 2019-06-14 DIAGNOSIS — Z79890 Hormone replacement therapy (postmenopausal): Secondary | ICD-10-CM | POA: Diagnosis not present

## 2019-06-14 DIAGNOSIS — M25551 Pain in right hip: Secondary | ICD-10-CM | POA: Diagnosis present

## 2019-06-14 DIAGNOSIS — E785 Hyperlipidemia, unspecified: Secondary | ICD-10-CM | POA: Diagnosis present

## 2019-06-14 DIAGNOSIS — E039 Hypothyroidism, unspecified: Secondary | ICD-10-CM | POA: Diagnosis present

## 2019-06-14 DIAGNOSIS — Z8546 Personal history of malignant neoplasm of prostate: Secondary | ICD-10-CM | POA: Diagnosis not present

## 2019-06-14 DIAGNOSIS — D62 Acute posthemorrhagic anemia: Secondary | ICD-10-CM | POA: Diagnosis not present

## 2019-06-14 DIAGNOSIS — Z7982 Long term (current) use of aspirin: Secondary | ICD-10-CM | POA: Diagnosis not present

## 2019-06-14 DIAGNOSIS — Z79899 Other long term (current) drug therapy: Secondary | ICD-10-CM | POA: Diagnosis not present

## 2019-06-14 DIAGNOSIS — Z888 Allergy status to other drugs, medicaments and biological substances status: Secondary | ICD-10-CM | POA: Diagnosis not present

## 2019-06-14 DIAGNOSIS — M1611 Unilateral primary osteoarthritis, right hip: Secondary | ICD-10-CM | POA: Diagnosis present

## 2019-06-14 DIAGNOSIS — Z791 Long term (current) use of non-steroidal anti-inflammatories (NSAID): Secondary | ICD-10-CM | POA: Diagnosis not present

## 2019-06-14 LAB — GLUCOSE, CAPILLARY
Glucose-Capillary: 183 mg/dL — ABNORMAL HIGH (ref 70–99)
Glucose-Capillary: 197 mg/dL — ABNORMAL HIGH (ref 70–99)
Glucose-Capillary: 245 mg/dL — ABNORMAL HIGH (ref 70–99)
Glucose-Capillary: 269 mg/dL — ABNORMAL HIGH (ref 70–99)

## 2019-06-14 LAB — CBC
HCT: 23.9 % — ABNORMAL LOW (ref 39.0–52.0)
HCT: 28.9 % — ABNORMAL LOW (ref 39.0–52.0)
Hemoglobin: 7.9 g/dL — ABNORMAL LOW (ref 13.0–17.0)
Hemoglobin: 9.6 g/dL — ABNORMAL LOW (ref 13.0–17.0)
MCH: 29.3 pg (ref 26.0–34.0)
MCH: 29.4 pg (ref 26.0–34.0)
MCHC: 33.1 g/dL (ref 30.0–36.0)
MCHC: 33.2 g/dL (ref 30.0–36.0)
MCV: 88.5 fL (ref 80.0–100.0)
MCV: 88.4 fL (ref 80.0–100.0)
Platelets: 161 10*3/uL (ref 150–400)
Platelets: 170 10*3/uL (ref 150–400)
RBC: 2.7 MIL/uL — ABNORMAL LOW (ref 4.22–5.81)
RBC: 3.27 MIL/uL — ABNORMAL LOW (ref 4.22–5.81)
RDW: 12.6 % (ref 11.5–15.5)
RDW: 12.7 % (ref 11.5–15.5)
WBC: 6.4 10*3/uL (ref 4.0–10.5)
WBC: 6.8 10*3/uL (ref 4.0–10.5)
nRBC: 0 % (ref 0.0–0.2)
nRBC: 0 % (ref 0.0–0.2)

## 2019-06-14 LAB — PREPARE RBC (CROSSMATCH)

## 2019-06-14 MED ORDER — FUROSEMIDE 10 MG/ML IJ SOLN
20.0000 mg | Freq: Once | INTRAMUSCULAR | Status: AC
  Administered 2019-06-14: 20 mg via INTRAVENOUS
  Filled 2019-06-14: qty 2

## 2019-06-14 MED ORDER — SODIUM CHLORIDE 0.9% IV SOLUTION: Freq: Once | INTRAVENOUS | Status: AC

## 2019-06-14 MED ORDER — INSULIN GLARGINE 100 UNIT/ML ~~LOC~~ SOLN
20.0000 [IU] | Freq: Every day | SUBCUTANEOUS | Status: DC
  Administered 2019-06-14 – 2019-06-16 (×3): 20 [IU] via SUBCUTANEOUS
  Filled 2019-06-14 (×3): qty 0.2

## 2019-06-14 NOTE — Plan of Care (Signed)
  Problem: Safety: Goal: Ability to remain free from injury will improve Outcome: Adequate for Discharge   Problem: Education: Goal: Knowledge of the prescribed therapeutic regimen will improve Outcome: Adequate for Discharge Goal: Understanding of discharge needs will improve Outcome: Adequate for Discharge Goal: Individualized Educational Video(s) Outcome: Adequate for Discharge

## 2019-06-14 NOTE — Plan of Care (Signed)
  Problem: Safety: Goal: Ability to remain free from injury will improve Outcome: Progressing   Problem: Education: Goal: Knowledge of the prescribed therapeutic regimen will improve Outcome: Progressing Goal: Understanding of discharge needs will improve Outcome: Progressing   Problem: Activity: Goal: Ability to avoid complications of mobility impairment will improve Outcome: Progressing Goal: Ability to tolerate increased activity will improve Outcome: Progressing   Problem: Clinical Measurements: Goal: Postoperative complications will be avoided or minimized Outcome: Progressing

## 2019-06-14 NOTE — Progress Notes (Signed)
Subjective: 2 Days Post-Op Procedure(s) (LRB): RIGHT TOTAL HIP ARTHROPLASTY DIRECT ANTERIOR (Right) Patient reports pain as mild.  Very lethargic today.  No nausea/vomiting, lightheadedness/dizziness.  Objective: Vital signs in last 24 hours: Temp:  [97.5 F (36.4 C)-98.5 F (36.9 C)] 98.5 F (36.9 C) (04/25 0819) Pulse Rate:  [95-103] 99 (04/25 0819) Resp:  [16-18] 18 (04/25 0819) BP: (127-138)/(68-72) 133/72 (04/25 0819) SpO2:  [97 %-99 %] 97 % (04/25 0819)  Intake/Output from previous day: 04/24 0701 - 04/25 0700 In: 840 [P.O.:840] Out: 1800 [Urine:1800] Intake/Output this shift: No intake/output data recorded.  Recent Labs    06/13/19 0522 06/14/19 0600  HGB 8.4* 7.9*   Recent Labs    06/13/19 0522 06/14/19 0600  WBC 7.3 6.4  RBC 2.99* 2.70*  HCT 26.3* 23.9*  PLT 187 161   Recent Labs    06/13/19 0522  NA 133*  K 4.4  CL 100  CO2 23  BUN 22  CREATININE 1.32*  GLUCOSE 322*  CALCIUM 8.7*   No results for input(s): LABPT, INR in the last 72 hours.  Neurologically intact Neurovascular intact Sensation intact distally Intact pulses distally Dorsiflexion/Plantar flexion intact Incision: scant drainage No cellulitis present Compartment soft   Assessment/Plan: 2 Days Post-Op Procedure(s) (LRB): RIGHT TOTAL HIP ARTHROPLASTY DIRECT ANTERIOR (Right) Advance diet Up with therapy  WBAT RLE ABLA- trending down from 13.0 pre-op, 8.4 yesterday, 7.9 today.  Patient very lethargic today.  Will order one unit PRBC D/c dispo- possibly tomorrow hhpt vs snf (recommended by PT today).       Aundra Dubin 06/14/2019, 9:46 AM

## 2019-06-14 NOTE — Progress Notes (Signed)
Pt was up in the chair and walked to the hall with PT this afternoon. A little weak today. Hgb 7.9, 1 Unit PRBC given at 1600 today.

## 2019-06-14 NOTE — Progress Notes (Signed)
Occupational Therapy Treatment Patient Details Name: Anthony Dunlap MRN: [id number] DOB: 07-29-1933 Today's Date: 06/14/2019    History of present illness Pt is an 86 y/o male s/p R THA, direct anterior. PMH includes DM, hepatits, prostate cancer, and osteomyelitis of pelivic region.    OT comments  Pt presents supine in bed agreeable to therapies. Pt continues to require two person assist for safe completion of all aspects of mobility/functional tasks today using RW, tolerating OOB to [facility] and to recliner. Pt with poor sitting balance both EOB and while sitting on BSC and requiring max multimodal cues and up to maxA to safely maintain upright position due to strong posterior/left lateral lean. Pt with incontinent BM with initial standing and ultimately requiring totalA for completion of toileting task and LB dressing ADL. Pt with decreased insight into current level/need for assist. Have concerns of pt returning home with spouse given current deficits as he remains at a high risk for falls. Feel pt will strongly benefit from SNF level therapies at time of discharge prior to return home. Will continue to follow while he remains acutely admitted.   Follow Up Recommendations  SNF;Supervision/Assistance - 24 hour;Follow surgeon's recommendation for DC plan and follow-up therapies    Equipment Recommendations  3 in 1 bedside commode;Other (comment)(TBD in next venue)          Precautions / Restrictions Precautions Precautions: Fall Restrictions Weight Bearing Restrictions: Yes RLE Weight Bearing: Weight bearing as tolerated       Mobility Bed Mobility Overal bed mobility: Needs Assistance Bed Mobility: Supine to Sit     Supine to sit: Mod assist     General bed mobility comments: Multimodal cues for sequencing including bending LLE and reaching for railing with LUE. Pt with increased difficulty problem solving how to get EOB, requiring modA to boost trunk up to sitting position.    Transfers Overall transfer level: Needs assistance Equipment used: Rolling walker (2 wheeled) Transfers: Sit to/from [facility] Sit to Stand: Mod assist;+2 physical assistance;+2 safety/equipment;From elevated surface Stand pivot transfers: Mod assist;+2 physical assistance       General transfer comment: Pt stood from elevated bed height with modA + 2; cues for hand placement. Decreased coordination, instability, and posterior lean noted. Pt with bowel incontinence upon standing so sat back down on the bed and then subsequently performed a stand pivot to [facility] then to recliner with modA + 2. Cues provided for sequencing/direction/stepping initiation and execution.    Balance Overall balance assessment: Needs assistance Sitting-balance support: Feet supported;Bilateral upper extremity supported Sitting balance-Leahy Scale: Poor Sitting balance - Comments: pt requiring max multimodal cueing to correct sitting posture while EOB, unable to maintain sitting balance while seated on BSC and requiring assist to ensure safety as pt with heavy posterior lean and subsequently scooting hips forward while seated Postural control: Posterior lean Standing balance support: Bilateral upper extremity supported;During functional activity Standing balance-Leahy Scale: Poor                             ADL either performed or assessed with clinical judgement   ADL Overall ADL's : Needs assistance/impaired                 Upper Body Dressing : Maximal assistance;Sitting Upper Body Dressing Details (indicate cue type and reason): due to poor sitting balance Lower Body Dressing: Maximal assistance;Total assistance;+2 for physical assistance;Sit to/from stand Lower Body Dressing Details (indicate  cue type and reason): requiring assist to doff/don socks Toilet Transfer: Moderate assistance;+2 for physical assistance;Stand-pivot;BSC;RW   Toileting- Clothing Manipulation and  Hygiene: Total assistance;+2 for physical assistance;Sit to/from stand Toileting - Clothing Manipulation Details (indicate cue type and reason): pt with incontinent BM with initial stand from EOB, transferred to [facility] for further toileting and pericare, pt unable to assist      Functional mobility during ADLs: Moderate assistance;+2 for physical assistance;Rolling walker General ADL Comments: pt with notable cognitive impairments, poor sitting balance, awareness and overall functional status                        Cognition Arousal/Alertness: Awake/alert Behavior During Therapy: WFL for tasks assessed/performed Overall Cognitive Status: Impaired/Different from baseline Area of Impairment: Attention;Following commands;Safety/judgement;Awareness;Problem solving                   Current Attention Level: Focused   Following Commands: Follows one step commands inconsistently Safety/Judgement: Decreased awareness of safety;Decreased awareness of deficits Awareness: Intellectual Problem Solving: Requires verbal cues;Difficulty sequencing General Comments: Pt following 1 step commands inconsistently and unable to maintain safety precautions despite max multimodal cues and repetition i.e. heavy left lateral lean on BSC. Poor insight/awareness into deficits, stating, "I'll just use the walker when I get home."         Exercises Exercises: Other exercises;Total Joint Total Joint Exercises Long Arc Quad: Right;10 reps;Seated Other Exercises Other Exercises: Static sitting balance x 3-5 minutes with use of chair in front of pt to promote anterior lean   Shoulder Instructions       General Comments      Pertinent Vitals/ Pain       Pain Assessment: Faces Faces Pain Scale: Hurts little more Pain Location: Pt denying pain when questioned, however, did note guarding and left lateral lean when sitting Pain Descriptors / Indicators: Operative site guarding Pain Intervention(s):  Monitored during session  Home Living                                          Prior Functioning/Environment              Frequency  Min 2X/week        Progress Toward Goals  OT Goals(current goals can now be found in the care plan section)  Progress towards OT goals: OT to reassess next treatment  Acute Rehab OT Goals Patient Stated Goal: to go home OT Goal Formulation: With patient Time For Goal Achievement: 06/27/19 Potential to Achieve Goals: Good ADL Goals Pt Will Perform Grooming: with min guard assist;standing Pt Will Perform Lower Body Dressing: sitting/lateral leans;sit to/from stand;with adaptive equipment;with min assist Pt Will Transfer to Toilet: with min guard assist;ambulating;bedside commode Additional ADL Goal #1: Pt will increase standing tolerance x3 mins for increasing activity tolerance for OOB ADL Additional ADL Goal #2: Pt will increase sit to stands to minA overall with proper technique to avoid posterior lean with minimal cues.  Plan Discharge plan needs to be updated    Co-evaluation    PT/OT/SLP Co-Evaluation/Treatment: Yes Reason for Co-Treatment: Complexity of the patient's impairments (multi-system involvement);Necessary to address cognition/behavior during functional activity;For patient/therapist safety;To address functional/ADL transfers PT goals addressed during session: Mobility/safety with mobility OT goals addressed during session: ADL's and self-care      AM-PAC OT "6 Clicks" Daily Activity  Outcome Measure   Help from another person eating meals?: A Little Help from another person taking care of personal grooming?: A Little Help from another person toileting, which includes using toliet, bedpan, or urinal?: Total Help from another person bathing (including washing, rinsing, drying)?: A Lot Help from another person to put on and taking off regular upper body clothing?: A Lot Help from another person to  put on and taking off regular lower body clothing?: Total 6 Click Score: 12    End of Session Equipment Utilized During Treatment: Gait belt;Rolling walker  OT Visit Diagnosis: Unsteadiness on feet (R26.81);Muscle weakness (generalized) (M62.81);Pain;Other symptoms and signs involving cognitive function Pain - Right/Left: Right Pain - part of body: Hip   Activity Tolerance Patient tolerated treatment well   Patient Left in chair;with call bell/phone within reach;with chair alarm set   Nurse Communication Mobility status        Time: [id number] OT Time Calculation (min): 33 min  Charges: OT General Charges $OT Visit: 1 Visit OT Treatments $Self Care/Home Management : 8-22 mins  Lou Cal, OT Acute Rehabilitation Services Pager [phone] Office [place] 06/14/2019, 10:50 AM

## 2019-06-14 NOTE — Progress Notes (Addendum)
Physical Therapy Treatment Patient Details Name: Anthony Dunlap MRN: [id number] DOB: 1933/03/20 Today's Date: 06/14/2019    History of Present Illness Pt is an 86 y/o male s/p R THA, direct anterior. PMH includes DM, hepatits, prostate cancer, and osteomyelitis of pelivic region.     PT Comments    During afternoon session, pt able to progress ambulation to 70 feet with moderate assist for stability and close chair follow utilized. He still requires significant two person assist to stand and cannot sit unsupported. He displays poor sitting/standing balance, weakness, decreased cognition, and decreased endurance. Presents as a significant fall risk based on deficits listed above and poor safety awareness. He is unable to perform ADL's such as toileting. In light of deficits listed above and lack of consistent caregiver support, highly recommending SNF at discharge.     Follow Up Recommendations  SNF;Supervision/Assistance - 24 hour     Equipment Recommendations  Wheelchair (measurements PT);Wheelchair cushion (measurements PT)    Recommendations for Other Services       Precautions / Restrictions Precautions Precautions: Fall Restrictions Weight Bearing Restrictions: Yes RLE Weight Bearing: Weight bearing as tolerated    Mobility  Bed Mobility Overal bed mobility: Needs Assistance Bed Mobility: Sit to Supine     Supine to sit: Mod assist     General bed mobility comments: ModA for BLE management back into bed  Transfers Overall transfer level: Needs assistance Equipment used: Rolling walker (2 wheeled) Transfers: Sit to/from [facility] Sit to Stand: Mod assist;+2 physical assistance;+2 safety/equipment;From elevated surface Stand pivot transfers: Mod assist;+2 physical assistance;+2 safety/equipment       General transfer comment: ModA + 2 to boost and stand from recliner. Cues for hand placement. Displays posterior lean. At end of session, pivoting  from bed to chair towards left.  Ambulation/Gait Ambulation/Gait assistance: Mod assist;+2 safety/equipment Gait Distance (Feet): 70 Feet Assistive device: Rolling walker (2 wheeled) Gait Pattern/deviations: Step-through pattern;Trunk flexed;Decreased stance time - right;Decreased dorsiflexion - right;Antalgic;Decreased stride length Gait velocity: decreased Gait velocity interpretation: <1.31 ft/sec, indicative of household ambulator General Gait Details: Requiring modA to hold upright, close chair follow for safety. Pt demonstrates poor proximity to walker despite max cues. Noted increased bilateral knee flexion, crouched posture.    Stairs             Wheelchair Mobility    Modified Rankin (Stroke Patients Only)       Balance Overall balance assessment: Needs assistance Sitting-balance support: Feet supported;Bilateral upper extremity supported Sitting balance-Leahy Scale: Poor   Postural control: Posterior lean Standing balance support: Bilateral upper extremity supported;During functional activity Standing balance-Leahy Scale: Poor                              Cognition Arousal/Alertness: Awake/alert Behavior During Therapy: WFL for tasks assessed/performed Overall Cognitive Status: Impaired/Different from baseline Area of Impairment: Attention;Following commands;Safety/judgement;Awareness;Problem solving                   Current Attention Level: Focused   Following Commands: Follows one step commands inconsistently Safety/Judgement: Decreased awareness of safety;Decreased awareness of deficits Awareness: Intellectual Problem Solving: Requires verbal cues;Difficulty sequencing General Comments: Pt following 1 step commands inconsistently and unable to maintain safety precautions despite max multimodal cues and repetition i.e. heavy left lateral lean on BSC. Poor insight/awareness into deficits, stating, "I'll just use the walker when I get  home."       Exercises Total  Joint Exercises Long Arc Quad: Right;10 reps;Seated    General Comments        Pertinent Vitals/Pain Pain Assessment: Faces Faces Pain Scale: Hurts little more Pain Location: Pt denying pain when questioned, however, did note guarding and left lateral lean when sitting Pain Descriptors / Indicators: Operative site guarding Pain Intervention(s): Monitored during session    Home Living                      Prior Function            PT Goals (current goals can now be found in the care plan section) Acute Rehab PT Goals Patient Stated Goal: to go home Potential to Achieve Goals: Fair Progress towards PT goals: Progressing toward goals    Frequency    7X/week      PT Plan Current plan remains appropriate    Co-evaluation              AM-PAC PT "6 Clicks" Mobility   Outcome Measure  Help needed turning from your back to your side while in a flat bed without using bedrails?: A Lot Help needed moving from lying on your back to sitting on the side of a flat bed without using bedrails?: A Lot Help needed moving to and from a bed to a chair (including a wheelchair)?: A Lot Help needed standing up from a chair using your arms (e.g., wheelchair or bedside chair)?: A Lot Help needed to walk in hospital room?: A Lot Help needed climbing 3-5 steps with a railing? : Total 6 Click Score: 11    End of Session Equipment Utilized During Treatment: Gait belt Activity Tolerance: Patient tolerated treatment well Patient left: with call bell/phone within reach;in bed;with bed alarm set Nurse Communication: Mobility status PT Visit Diagnosis: Unsteadiness on feet (R26.81);Muscle weakness (generalized) (M62.81)     Time: [id number] PT Time Calculation (min) (ACUTE ONLY): 12 min  Charges:  $Gait Training: 8-22 mins                       Wyona Almas, PT, DPT Acute Rehabilitation Services Pager [phone] Office  [phone]    Deno Etienne 06/14/2019, 4:40 PM

## 2019-06-14 NOTE — Evaluation (Addendum)
Physical Therapy Evaluation Patient Details Name: Anthony Dunlap MRN: [id number] DOB: 06/27/1933 Today's Date: 06/14/2019   History of Present Illness  Pt is an 86 y/o male s/p R THA, direct anterior. PMH includes DM, hepatits, prostate cancer, and osteomyelitis of pelivic region.   Clinical Impression  Pt is progressing slowly towards his physical therapy goals. Denies right hip pain, however, does seem to be actively guarding away from right side. He is unable to sit unsupported on edge of bed or bedside commode; displays posterior and left lateral lean. Worked on static sitting balance on edge of bed with chair placed in front of patient to promote anterior lean. Pt continues to require significant, heavy two person assist for bed mobility and transfers to standing. Pt with bowel incontinence upon standing and required total assist for peri care. He demonstrates poor sitting/standing balance, weakness, decreased coordination, and poor insight/awareness into deficits. Presents as a high fall risk based on impairments listed above. In light of this and decreased caregiver support (pt wife has dementia), highly recommending SNF upon discharge.     Follow Up Recommendations SNF;Supervision/Assistance - 24 hour    Equipment Recommendations  Wheelchair (measurements PT);Wheelchair cushion (measurements PT)    Recommendations for Other Services       Precautions / Restrictions Precautions Precautions: Fall Restrictions Weight Bearing Restrictions: Yes RLE Weight Bearing: Weight bearing as tolerated      Mobility  Bed Mobility Overal bed mobility: Needs Assistance Bed Mobility: Supine to Sit     Supine to sit: Mod assist     General bed mobility comments: Multimodal cues for sequencing including bending LLE and reaching for railing with LUE. Pt with increased difficulty problem solving how to get EOB, requiring modA to boost trunk up to sitting position.   Transfers Overall transfer  level: Needs assistance Equipment used: Rolling walker (2 wheeled) Transfers: Sit to/from [facility] Sit to Stand: Mod assist;+2 physical assistance;+2 safety/equipment;From elevated surface Stand pivot transfers: Mod assist;+2 physical assistance       General transfer comment: Pt stood from elevated bed height with modA + 2; cues for hand placement. Decreased coordination, instability, and posterior lean noted. Pt with bowel incontinence upon standing so sat back down on the bed and then subsequently performed a stand pivot to [facility] then to recliner with modA + 2. Cues provided for sequencing/direction/stepping initiation and execution.  Ambulation/Gait             General Gait Details: deferred due to bowel incontinence and pt fatigue  Stairs            Wheelchair Mobility    Modified Rankin (Stroke Patients Only)       Balance Overall balance assessment: Needs assistance Sitting-balance support: Feet supported;Bilateral upper extremity supported Sitting balance-Leahy Scale: Poor   Postural control: Posterior lean Standing balance support: Bilateral upper extremity supported;During functional activity Standing balance-Leahy Scale: Poor                               Pertinent Vitals/Pain Pain Assessment: Faces Faces Pain Scale: Hurts little more Pain Location: Pt denying pain when questioned, however, did note guarding and left lateral lean when sitting Pain Descriptors / Indicators: Operative site guarding Pain Intervention(s): Monitored during session    Home Living                        Prior Function  Hand Dominance        Extremity/Trunk Assessment                Communication      Cognition Arousal/Alertness: Awake/alert Behavior During Therapy: WFL for tasks assessed/performed Overall Cognitive Status: Impaired/Different from baseline Area of Impairment: Attention;Following  commands;Safety/judgement;Awareness;Problem solving                   Current Attention Level: Focused   Following Commands: Follows one step commands inconsistently Safety/Judgement: Decreased awareness of safety;Decreased awareness of deficits Awareness: Intellectual Problem Solving: Requires verbal cues;Difficulty sequencing General Comments: Pt following 1 step commands inconsistently and unable to maintain safety precautions despite max multimodal cues and repetition i.e. heavy left lateral lean on BSC. Poor insight/awareness into deficits, stating, "I'll just use the walker when I get home."       General Comments      Exercises Total Joint Exercises Long Arc Quad: Right;10 reps;Seated Other Exercises Other Exercises: Static sitting balance x 3-5 minutes with use of chair in front of pt to promote anterior lean   Assessment/Plan    PT Assessment    PT Problem List         PT Treatment Interventions      PT Goals (Current goals can be found in the Care Plan section)  Acute Rehab PT Goals Patient Stated Goal: to go home Potential to Achieve Goals: Fair    Frequency 7X/week   Barriers to discharge        Co-evaluation PT/OT/SLP Co-Evaluation/Treatment: Yes Reason for Co-Treatment: Complexity of the patient's impairments (multi-system involvement);Necessary to address cognition/behavior during functional activity;For patient/therapist safety;To address functional/ADL transfers PT goals addressed during session: Mobility/safety with mobility         AM-PAC PT "6 Clicks" Mobility  Outcome Measure Help needed turning from your back to your side while in a flat bed without using bedrails?: A Lot Help needed moving from lying on your back to sitting on the side of a flat bed without using bedrails?: A Lot Help needed moving to and from a bed to a chair (including a wheelchair)?: A Lot Help needed standing up from a chair using your arms (e.g., wheelchair or  bedside chair)?: A Lot Help needed to walk in hospital room?: A Lot Help needed climbing 3-5 steps with a railing? : Total 6 Click Score: 11    End of Session Equipment Utilized During Treatment: Gait belt Activity Tolerance: Patient limited by fatigue Patient left: in chair;with call bell/phone within reach;with chair alarm set Nurse Communication: Mobility status PT Visit Diagnosis: Unsteadiness on feet (R26.81);Muscle weakness (generalized) (M62.81)    Time: [id number] PT Time Calculation (min) (ACUTE ONLY): 33 min   Charges:     PT Treatments $Therapeutic Activity: 8-22 mins          Wyona Almas, PT, DPT Acute Rehabilitation Services Pager [phone] Office [phone]   Deno Etienne 06/14/2019, 10:22 AM

## 2019-06-15 ENCOUNTER — Encounter: Payer: Self-pay | Admitting: *Deleted

## 2019-06-15 LAB — GLUCOSE, CAPILLARY
Glucose-Capillary: 153 mg/dL — ABNORMAL HIGH (ref 70–99)
Glucose-Capillary: 200 mg/dL — ABNORMAL HIGH (ref 70–99)
Glucose-Capillary: 196 mg/dL — ABNORMAL HIGH (ref 70–99)
Glucose-Capillary: 166 mg/dL — ABNORMAL HIGH (ref 70–99)

## 2019-06-15 LAB — TYPE AND SCREEN
ABO/RH(D): O POS
Antibody Screen: NEGATIVE
Unit division: 0

## 2019-06-15 LAB — BPAM RBC
Blood Product Expiration Date: 202105212359
ISSUE DATE / TIME: 202104251529
Unit Type and Rh: 5100

## 2019-06-15 MED ORDER — MUPIROCIN 2 % EX OINT
1.0000 "application " | TOPICAL_OINTMENT | Freq: Two times a day (BID) | CUTANEOUS | Status: DC
  Administered 2019-06-15 – 2019-06-16 (×3): 1 via NASAL
  Filled 2019-06-15: qty 22

## 2019-06-15 MED ORDER — AMOXICILLIN-POT CLAVULANATE 875-125 MG PO TABS
1.0000 | ORAL_TABLET | Freq: Two times a day (BID) | ORAL | Status: DC
  Administered 2019-06-15 – 2019-06-16 (×3): 1 via ORAL
  Filled 2019-06-15 (×3): qty 1

## 2019-06-15 MED ORDER — CHLORHEXIDINE GLUCONATE CLOTH 2 % EX PADS
6.0000 | MEDICATED_PAD | Freq: Every day | CUTANEOUS | Status: DC
  Administered 2019-06-16: 07:00:00 6 via TOPICAL

## 2019-06-15 NOTE — Progress Notes (Signed)
Physical Therapy Treatment Patient Details Name: Anthony Dunlap MRN: [id number] DOB: 05-08-1933 Today's Date: 06/15/2019    History of Present Illness Pt is an 86 y/o male s/p R THA, direct anterior. PMH includes DM, hepatits, prostate cancer, and osteomyelitis of pelivic region.     PT Comments    Pt seated in recliner on arrival.  He appeared to be uncomfortable as he was starting to slide forward in his recliner chair.  Pt assisted into standing.  He continues to require moderate assistance to rise into standing.  Noted with bowel incontinence this session.  Pt did progress to min assistance with gt training and better carryover with RW management.  He continues to benefit from skilled rehab in a post acute setting to maximize functional gains before returning home.     Follow Up Recommendations  SNF;Supervision/Assistance - 24 hour     Equipment Recommendations  Wheelchair (measurements PT);Wheelchair cushion (measurements PT)    Recommendations for Other Services       Precautions / Restrictions Precautions Precautions: Fall Restrictions Weight Bearing Restrictions: Yes RLE Weight Bearing: Weight bearing as tolerated    Mobility  Bed Mobility Overal bed mobility: Needs Assistance Bed Mobility: Sit to Supine     Supine to sit: Mod assist Sit to supine: Min assist   General bed mobility comments: Min assistance to reposition trunk in bed.  Able to lift B LEs back to bed against gravity.  Transfers Overall transfer level: Needs assistance Equipment used: Rolling walker (2 wheeled) Transfers: Sit to/from Stand Sit to Stand: Mod assist         General transfer comment: Cues for hand placement to push from seated surface and mod assistance to boost into standing.  Upon pushing to stand he presented with bowel incontinence.  Ambulation/Gait Ambulation/Gait assistance: Min assist Gait Distance (Feet): 200 Feet Assistive device: Rolling walker (2 wheeled) Gait  Pattern/deviations: Step-through pattern;Trunk flexed;Decreased stance time - right;Decreased dorsiflexion - right;Antalgic;Decreased stride length Gait velocity: decreased   General Gait Details: Cues for RW position and safety.  Cues to maintain upper trunk control.   Stairs             Wheelchair Mobility    Modified Rankin (Stroke Patients Only)       Balance Overall balance assessment: Needs assistance Sitting-balance support: Feet supported;Bilateral upper extremity supported Sitting balance-Leahy Scale: Poor Sitting balance - Comments: remains to present with posterior bias when sitting Postural control: Posterior lean Standing balance support: Bilateral upper extremity supported;During functional activity;No upper extremity supported Standing balance-Leahy Scale: Poor Standing balance comment: Reliant on BUE and external support;without UE support during functional activity pt required minA for stability                            Cognition Arousal/Alertness: Awake/alert Behavior During Therapy: Restless;Impulsive Overall Cognitive Status: Impaired/Different from baseline Area of Impairment: Attention;Following commands;Safety/judgement;Awareness;Problem solving                   Current Attention Level: Focused   Following Commands: Follows one step commands inconsistently Safety/Judgement: Decreased awareness of safety;Decreased awareness of deficits Awareness: Intellectual Problem Solving: Requires verbal cues;Difficulty sequencing General Comments: Pt requires frequent verbal cues for sequencing and safety      Exercises Total Joint Exercises Ankle Circles/Pumps: AROM;Both;20 reps;Supine Quad Sets: AROM;Both;10 reps;Supine Short Arc Quad: Right;10 reps;Supine;AAROM Heel Slides: Right;10 reps;Supine;AAROM Hip ABduction/ADduction: Right;10 reps;Supine;AAROM    General Comments  Pertinent Vitals/Pain Pain Assessment:  Faces Faces Pain Scale: Hurts little more Pain Location: R hip Pain Descriptors / Indicators: Operative site guarding Pain Intervention(s): Monitored during session;Repositioned;Ice applied    Home Living                      Prior Function            PT Goals (current goals can now be found in the care plan section) Acute Rehab PT Goals Patient Stated Goal: to go home Potential to Achieve Goals: Fair Progress towards PT goals: Progressing toward goals    Frequency    7X/week      PT Plan Current plan remains appropriate    Co-evaluation PT/OT/SLP Co-Evaluation/Treatment: Yes Reason for Co-Treatment: Complexity of the patient's impairments (multi-system involvement);For patient/therapist safety;To address functional/ADL transfers PT goals addressed during session: Mobility/safety with mobility OT goals addressed during session: ADL's and self-care      AM-PAC PT "6 Clicks" Mobility   Outcome Measure  Help needed turning from your back to your side while in a flat bed without using bedrails?: A Lot Help needed moving from lying on your back to sitting on the side of a flat bed without using bedrails?: A Lot Help needed moving to and from a bed to a chair (including a wheelchair)?: A Lot Help needed standing up from a chair using your arms (e.g., wheelchair or bedside chair)?: A Lot Help needed to walk in hospital room?: A Little Help needed climbing 3-5 steps with a railing? : A Lot 6 Click Score: 13    End of Session Equipment Utilized During Treatment: Gait belt Activity Tolerance: Patient tolerated treatment well Patient left: with chair alarm set;in bed;with bed alarm set Nurse Communication: Mobility status PT Visit Diagnosis: Unsteadiness on feet (R26.81);Muscle weakness (generalized) (M62.81)     Time: [id number] PT Time Calculation (min) (ACUTE ONLY): 26 min  Charges:  $Gait Training: 8-22 mins $Therapeutic Exercise: 8-22 mins                      [name] , PTA Acute Rehabilitation Services Pager [phone] Office [phone]     Quinzell Malcomb [name] 06/15/2019, 2:56 PM

## 2019-06-15 NOTE — Progress Notes (Signed)
Physical Therapy Treatment Patient Details Name: Anthony Dunlap MRN: [id number] DOB: 10-Mar-1933 Today's Date: 06/15/2019    History of Present Illness Pt is an 86 y/o male s/p R THA, direct anterior. PMH includes DM, hepatits, prostate cancer, and osteomyelitis of pelivic region.     PT Comments    Pt supine in bed on arrival.  He remains to lack insight into his deficits.  He continues to report he has caregivers at home but also reports they mostly help his wife ( who has baseline dementia).  He continues to be at high risk for fall based on his balance and coordination when standing.  He continues to benefit from rehab in a post acute setting before returning home.  Will f/u in pm to progress functional mobility to tolerance.  Chair alarm active post session.    Follow Up Recommendations  SNF;Supervision/Assistance - 24 hour     Equipment Recommendations  Wheelchair (measurements PT);Wheelchair cushion (measurements PT)    Recommendations for Other Services       Precautions / Restrictions Precautions Precautions: Fall Restrictions Weight Bearing Restrictions: Yes RLE Weight Bearing: Weight bearing as tolerated    Mobility  Bed Mobility Overal bed mobility: Needs Assistance Bed Mobility: Supine to Sit     Supine to sit: Mod assist     General bed mobility comments: Mod assistance for grounding LLE and elevating trunk into a seated position. Posterior bias seated edge of bed and posterior pelvic tilt noted.  Transfers Overall transfer level: Needs assistance Equipment used: Rolling walker (2 wheeled) Transfers: Sit to/from [facility] Sit to Stand: Mod assist;+2 safety/equipment         General transfer comment: Mod +1 to boost with sba for safety from second person.  Cues for hand placement and forward weight shifting.  Ambulation/Gait Ambulation/Gait assistance: Mod assist;+2 safety/equipment Gait Distance (Feet): 100 Feet Assistive device:  Rolling walker (2 wheeled) Gait Pattern/deviations: Step-through pattern;Trunk flexed;Decreased stance time - right;Decreased dorsiflexion - right;Antalgic;Decreased stride length     General Gait Details: Cues to push RW continuously vs picking up device ( which causes imbalance in standing).  He required cues for forward gaze, weight shifting and maintaining close proximity to RW.  Pt very distractable when mobilizing.   Stairs             Wheelchair Mobility    Modified Rankin (Stroke Patients Only)       Balance Overall balance assessment: Needs assistance Sitting-balance support: Feet supported;Bilateral upper extremity supported Sitting balance-Leahy Scale: Poor Sitting balance - Comments: Pt with poor dynamic sitting balance.  Leaning posteriorly when attempting to donn socks Postural control: Posterior lean   Standing balance-Leahy Scale: Poor Standing balance comment: Reliant on BUE and external support                             Cognition Arousal/Alertness: Awake/alert Behavior During Therapy: Restless;Impulsive Overall Cognitive Status: Impaired/Different from baseline Area of Impairment: Attention;Following commands;Safety/judgement;Awareness;Problem solving                   Current Attention Level: Focused   Following Commands: Follows one step commands inconsistently Safety/Judgement: Decreased awareness of safety;Decreased awareness of deficits Awareness: Intellectual Problem Solving: Requires verbal cues;Difficulty sequencing        Exercises Total Joint Exercises Ankle Circles/Pumps: AROM;Both;20 reps;Supine Quad Sets: AROM;Both;10 reps;Supine Short Arc Quad: Right;10 reps;Supine;AAROM Heel Slides: Right;10 reps;Supine;AAROM Hip ABduction/ADduction: Right;10 reps;Supine;AAROM  General Comments        Pertinent Vitals/Pain Pain Assessment: Faces Faces Pain Scale: Hurts little more Pain Location: Pt denying pain when  questioned, however, did note guarding and left lateral lean when sitting Pain Descriptors / Indicators: Operative site guarding Pain Intervention(s): Monitored during session;Repositioned;Ice applied    Home Living                      Prior Function            PT Goals (current goals can now be found in the care plan section) Acute Rehab PT Goals Patient Stated Goal: to go home Potential to Achieve Goals: Fair Progress towards PT goals: Progressing toward goals    Frequency    7X/week      PT Plan Current plan remains appropriate    Co-evaluation PT/OT/SLP Co-Evaluation/Treatment: Yes Reason for Co-Treatment: Complexity of the patient's impairments (multi-system involvement) PT goals addressed during session: Mobility/safety with mobility        AM-PAC PT "6 Clicks" Mobility   Outcome Measure  Help needed turning from your back to your side while in a flat bed without using bedrails?: A Lot Help needed moving from lying on your back to sitting on the side of a flat bed without using bedrails?: A Lot Help needed moving to and from a bed to a chair (including a wheelchair)?: A Lot Help needed standing up from a chair using your arms (e.g., wheelchair or bedside chair)?: A Lot Help needed to walk in hospital room?: A Lot Help needed climbing 3-5 steps with a railing? : A Lot 6 Click Score: 12    End of Session Equipment Utilized During Treatment: Gait belt Activity Tolerance: Patient tolerated treatment well Patient left: in chair;with call bell/phone within reach;with chair alarm set Nurse Communication: Mobility status PT Visit Diagnosis: Unsteadiness on feet (R26.81);Muscle weakness (generalized) (M62.81)     Time: [id number] PT Time Calculation (min) (ACUTE ONLY): 23 min  Charges:  $Gait Training: 8-22 mins                     [name] , PTA Acute Rehabilitation Services Pager [phone] Office [phone]     Shilah Hefel [name] 06/15/2019, 12:18 PM

## 2019-06-15 NOTE — Progress Notes (Signed)
Occupational Therapy Treatment Patient Details Name: SAMORI PACHA MRN: [id number] DOB: [birth year] Today's Date: 06/15/2019    History of present illness Pt is an 86 y/o male s/p R THA, direct anterior. PMH includes DM, hepatits, prostate cancer, and osteomyelitis of pelivic region.    OT comments  Pt making slow progress toward established OT goals. However, he continues to demonstrate significant safety concerns due to decreased safety awareness, decreased insight to deficits and instability. Pt required modA for support standing at sink level to complete grooming task, he required consistent multimodal cues to correct balance. Pt reports he will have assistance from caregivers at home, who assist his wife with dementia. Pt will need personal 24/7 physical assistance. Due to his cognitive limitations and physical limitations he presents as a high fall risk. Continue to recommend SNF to maximize pt's independence and safety with ADL/IADL and functional mobility.   Follow Up Recommendations  SNF;Supervision/Assistance - 24 hour    Equipment Recommendations  3 in 1 bedside commode    Recommendations for Other Services      Precautions / Restrictions Precautions Precautions: Fall Restrictions Weight Bearing Restrictions: Yes RLE Weight Bearing: Weight bearing as tolerated       Mobility Bed Mobility Overal bed mobility: Needs Assistance Bed Mobility: Supine to Sit     Supine to sit: Mod assist     General bed mobility comments: Mod assistance for grounding LLE and elevating trunk into a seated position. Posterior bias seated edge of bed and posterior pelvic tilt noted.  Transfers Overall transfer level: Needs assistance Equipment used: Rolling walker (2 wheeled) Transfers: Sit to/from Stand Sit to Stand: Mod assist;+2 safety/equipment         General transfer comment: Mod +1 to boost with sba for safety from second person.  Cues for hand placement and forward weight  shifting.    Balance Overall balance assessment: Needs assistance Sitting-balance support: Feet supported;Bilateral upper extremity supported Sitting balance-Leahy Scale: Poor Sitting balance - Comments: Pt with poor dynamic sitting balance.  Leaning posteriorly when attempting to donn socks Postural control: Posterior lean Standing balance support: Bilateral upper extremity supported;During functional activity;No upper extremity supported Standing balance-Leahy Scale: Poor Standing balance comment: Reliant on BUE and external support;without UE support during functional activity pt required modA for stability                           ADL either performed or assessed with clinical judgement   ADL Overall ADL's : Needs assistance/impaired     Grooming: Moderate assistance;Standing;Oral care;Wash/dry face;Wash/dry Nurse, mental health Details (indicate cue type and reason): modA for support while standing at sink level, pt with right lean                  Toilet Transfer: Moderate assistance;+2 for safety/equipment   Toileting- Clothing Manipulation and Hygiene: Moderate assistance;+2 for safety/equipment;Sit to/from stand       Functional mobility during ADLs: Moderate assistance;+2 for safety/equipment;Rolling walker General ADL Comments: pt with poor cognition, decreased safety awareness requiring cues for postural control and correcting instability     Vision   Vision Assessment?: No apparent visual deficits   Perception     Praxis      Cognition Arousal/Alertness: Awake/alert Behavior During Therapy: Restless;Impulsive Overall Cognitive Status: Impaired/Different from baseline Area of Impairment: Attention;Following commands;Safety/judgement;Awareness;Problem solving                   Current Attention Level:  Focused   Following Commands: Follows one step commands inconsistently Safety/Judgement: Decreased awareness of safety;Decreased awareness  of deficits Awareness: Intellectual Problem Solving: Requires verbal cues;Difficulty sequencing General Comments: Pt requires frequent verbal cues for sequencing and safety        Exercises Total Joint Exercises Ankle Circles/Pumps: AROM;Both;20 reps;Supine Quad Sets: AROM;Both;10 reps;Supine Short Arc Quad: Right;10 reps;Supine;AAROM Heel Slides: Right;10 reps;Supine;AAROM Hip ABduction/ADduction: Right;10 reps;Supine;AAROM   Shoulder Instructions       General Comments      Pertinent Vitals/ Pain       Pain Assessment: Faces Faces Pain Scale: Hurts little more Pain Location: Pt denying pain when questioned, however, did note guarding and left lateral lean when sitting Pain Descriptors / Indicators: Operative site guarding Pain Intervention(s): Limited activity within patient's tolerance;Monitored during session  Home Living                                          Prior Functioning/Environment              Frequency  Min 2X/week        Progress Toward Goals  OT Goals(current goals can now be found in the care plan section)  Progress towards OT goals: Progressing toward goals  Acute Rehab OT Goals Patient Stated Goal: to go home OT Goal Formulation: With patient Time For Goal Achievement: 06/27/19 Potential to Achieve Goals: Good ADL Goals Pt Will Perform Grooming: with min guard assist;standing Pt Will Perform Lower Body Dressing: sitting/lateral leans;sit to/from stand;with adaptive equipment;with min assist Pt Will Transfer to Toilet: with min guard assist;ambulating;bedside commode Additional ADL Goal #1: Pt will increase standing tolerance x3 mins for increasing activity tolerance for OOB ADL Additional ADL Goal #2: Pt will increase sit to stands to minA overall with proper technique to avoid posterior lean with minimal cues.  Plan Discharge plan needs to be updated;Discharge plan remains appropriate    Co-evaluation    PT/OT/SLP  Co-Evaluation/Treatment: Yes Reason for Co-Treatment: Complexity of the patient's impairments (multi-system involvement);For patient/therapist safety;To address functional/ADL transfers PT goals addressed during session: Mobility/safety with mobility OT goals addressed during session: ADL's and self-care      AM-PAC OT "6 Clicks" Daily Activity     Outcome Measure   Help from another person eating meals?: A Little Help from another person taking care of personal grooming?: A Little Help from another person toileting, which includes using toliet, bedpan, or urinal?: A Lot Help from another person bathing (including washing, rinsing, drying)?: A Lot Help from another person to put on and taking off regular upper body clothing?: A Lot Help from another person to put on and taking off regular lower body clothing?: A Lot 6 Click Score: 14    End of Session Equipment Utilized During Treatment: Gait belt;Rolling walker  OT Visit Diagnosis: Unsteadiness on feet (R26.81);Muscle weakness (generalized) (M62.81);Pain;Other symptoms and signs involving cognitive function Pain - Right/Left: Right Pain - part of body: Hip   Activity Tolerance Patient tolerated treatment well   Patient Left in chair;with call bell/phone within reach;with chair alarm set   Nurse Communication Mobility status        Time: [id number] OT Time Calculation (min): 23 min  Charges: OT General Charges $OT Visit: 1 Visit OT Treatments $Self Care/Home Management : 8-22 mins  Helene Kelp OTR/L Acute Rehabilitation Services Office: [place] 06/15/2019, 1:50  PM

## 2019-06-15 NOTE — [place] FL2 (Signed)
[place] MEDICAID FL2 LEVEL OF CARE SCREENING TOOL     IDENTIFICATION  Patient Name: Anthony Dunlap Birthdate: [birth year] Sex: male Admission Date (Current Location): 06/12/2019  [facility] and [place] Number:  Herbalist and Address:  [facility] [place]. [facility] [place] [address] [place], [place] [address]      Provider Number: [id number]  Attending Physician Name and Address:  Marybelle Killings, MD  Relative Name and Phone Number:       Current Level of Care: Hospital Recommended Level of Care: [place] Prior Approval Number:    Date Approved/Denied:   PASRR Number: [id number]  Discharge Plan: SNF    Current Diagnoses: Patient Active Problem List   Diagnosis Date Noted  . Arthritis of right hip 06/12/2019  . Osteoarthritis of right hip 01/05/2019  . Pelvic abscess in male [facility]) 09/11/2018  . Drug-induced hepatitis 06/05/2018  . Calculus of gallbladder without cholecystitis without obstruction   . Fungemia/Candida albicans 05/10/2018  . UTI (urinary tract infection) 05/08/2018  . Complicated UTI (urinary tract infection) 05/08/2018  . 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 Abscess of left thigh   . Pressure injury of skin 02/10/2018  . Enterococcus faecalis infection 01/27/2018  . Abscess   . Psoas abscess ([place]) 01/23/2018  . Sepsis due to Bacteroides species ([place]) 01/22/2018  . Sepsis without acute organ dysfunction ([place])   . DKA (diabetic ketoacidoses) ([place]) 01/17/2018  . Normocytic anemia 01/17/2018  . Chronic anemia 01/03/2018  . Chronic indwelling Foley catheter 01/03/2018  . Elevated alkaline phosphatase level 01/03/2018  . Hypothyroidism 01/03/2018  . Physical deconditioning 01/03/2018  . 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 HLD (hyperlipidemia) 08/19/2013  . Type 2 diabetes mellitus ([place])   . Prostate cancer ([place])     Orientation RESPIRATION BLADDER Height & Weight      Self, Time, Situation, Place  Normal Continent Weight: 79.3 kg Height:  5\' 8"  (172.7 cm)  BEHAVIORAL SYMPTOMS/MOOD NEUROLOGICAL BOWEL NUTRITION STATUS      Continent Diet  AMBULATORY STATUS COMMUNICATION OF NEEDS Skin   Extensive Assist Verbally Normal, Other (Comment)(Right total hip arthroplasty, 06/12/2019)                       Personal Care Assistance Level of Assistance  Bathing, Feeding, Dressing Bathing Assistance: Maximum assistance Feeding assistance: Independent Dressing Assistance: Maximum assistance     Functional Limitations Info  Sight, Hearing, Speech Sight Info: Adequate Hearing Info: Adequate Speech Info: Adequate    SPECIAL CARE FACTORS FREQUENCY  PT (By licensed PT), OT (By licensed OT)     PT Frequency: 5x/ week , evaluate and treat OT Frequency: 5x/ week , evaluate and treat            Contractures Contractures Info: Not present    Additional Factors Info  Code Status, Allergies Code Status Info: Full Code Allergies Info: Fluconazole           Current Medications (06/15/2019):  This is the current hospital active medication list Current Facility-Administered Medications  Medication Dose Route Frequency Provider Last Rate Last Admin  . acetaminophen (TYLENOL) tablet 325-650 mg  325-650 mg Oral Q6H PRN Lanae Crumbly, PA-C   650 mg at 06/15/19 [id number]  . ALPRAZolam Duanne Moron) tablet 0.5 mg  0.5 mg Oral TID PRN Lanae Crumbly, PA-C   0.5 mg at 06/13/19 0846  . amitriptyline (ELAVIL) tablet 10 mg  10 mg Oral QHS Lanae Crumbly, PA-C   10 mg at 06/14/19 2150  . amoxicillin-clavulanate (AUGMENTIN) 875-125 MG per tablet 1 tablet  1 tablet Oral Q12H Marybelle Killings, MD   1 tablet at 06/15/19 0944  . aspirin EC tablet 325 mg  325 mg Oral Q breakfast Lanae Crumbly, PA-C   325 mg at 06/15/19 [id number]  . Chlorhexidine Gluconate Cloth 2 % PADS 6 each  6 each Topical Daily Marybelle Killings, MD   6 each at 06/15/19 0945  . Chlorhexidine Gluconate Cloth 2 % PADS 6  each  6 each Topical Q0600 Marybelle Killings, MD      . docusate sodium (COLACE) capsule 100 mg  100 mg Oral BID Lanae Crumbly, PA-C   100 mg at 06/15/19 0945  . ferrous sulfate tablet 325 mg  325 mg Oral QPM Lanae Crumbly, PA-C   325 mg at 06/14/19 1735  . furosemide (LASIX) tablet 20 mg  20 mg Oral q morning - 10a Lanae Crumbly, PA-C   20 mg at 06/15/19 0944  . glimepiride (AMARYL) tablet 4 mg  4 mg Oral q morning - 10a Lanae Crumbly, PA-C   4 mg at 06/15/19 [id number]  . HYDROmorphone (DILAUDID) injection 0.5 mg  0.5 mg Intravenous Q4H PRN Benjiman Core M, PA-C      . insulin aspart (novoLOG) injection 0-15 Units  0-15 Units Subcutaneous TID WC Marybelle Killings, MD   3 Units at 06/15/19 1152  . insulin aspart (novoLOG) injection 0-5 Units  0-5 Units Subcutaneous QHS Marybelle Killings, MD   3 Units at 06/14/19 2151  . insulin glargine (LANTUS) injection 20 Units  20 Units Subcutaneous Daily Aundra Dubin, PA-C   20 Units at 06/15/19 0944  . levothyroxine (SYNTHROID) tablet 100 mcg  100 mcg Oral q morning - 10a Lanae Crumbly, PA-C   100 mcg at 06/15/19 [id number]  . menthol-cetylpyridinium (CEPACOL) lozenge 3 mg  1 lozenge Oral PRN Lanae Crumbly, PA-C       Or  . phenol (CHLORASEPTIC) mouth spray 1 spray  1 spray Mouth/Throat PRN Lanae Crumbly, PA-C      . metoCLOPramide (REGLAN) tablet 5-10 mg  5-10 mg Oral Q8H PRN Lanae Crumbly, PA-C   5 mg at 06/12/19 1418   Or  . metoCLOPramide (REGLAN) injection 5-10 mg  5-10 mg Intravenous Q8H PRN Lanae Crumbly, PA-C      . mupirocin ointment (BACTROBAN) 2 % 1 application  1 application Nasal BID Marybelle Killings, MD   1 application at [id number] 1152  . ondansetron (ZOFRAN) tablet 4 mg  4 mg Oral Q6H PRN Lanae Crumbly, PA-C       Or  . ondansetron [facility]) injection 4 mg  4 mg Intravenous Q6H PRN Lanae Crumbly, PA-C      . oxyCODONE (Oxy IR/ROXICODONE) immediate release tablet 5 mg  5 mg Oral Q4H PRN Marybelle Killings, MD   5 mg at 06/13/19 1143  . polyethylene glycol  (MIRALAX / GLYCOLAX) packet 17 g  17 g Oral Daily PRN Lanae Crumbly, PA-C      . pravastatin (PRAVACHOL) tablet 10 mg  10 mg Oral QPM Benjiman Core M, PA-C   10 mg at 06/14/19 1735  . primidone (MYSOLINE) tablet 100 mg  100 mg Oral QHS Lanae Crumbly, PA-C   100 mg at 06/14/19 2150     Discharge Medications:  Please see discharge summary for a list of discharge medications.  Relevant Imaging Results:  Relevant Lab Results:   Additional Information SS#[id number] [place]

## 2019-06-15 NOTE — TOC Progression Note (Signed)
Transition of Care [facility]) - Progression Note    Patient Details  Name: Anthony Dunlap MRN: [id number] Date of Birth: 1933-08-09  Transition [facility]) CM/SW Contact  Sharin Mons, RN Phone Number: [phone] 06/15/2019, 4:51 PM  Clinical Narrative:   - s/p R THA, direct anterior. PMH includes DM, hepatits, prostate cancer, and osteomyelitis of pelivic region.   NCM received consult for possible SNF placement at time of discharge. NCM spoke with patient regarding PT's current recommendation of SNF placement at time of discharge. Patient reported that patient's spouse who has Alzheimer's is unable to care for patient at their home given wife condition and  patient's current physical needs and fall risk. Patient expressed understanding of PT recommendation and is agreeable to SNF placement at time of discharge. Patient reports preference for [facility]. NCM discussed insurance authorization process and provided Medicare SNF ratings list. Patient expressed being hopeful for rehab and to feel better soon. No further questions reported at this time. NCM to continue to follow and assist with discharge planning needs.   Expected Discharge Plan: Skilled Nursing Facility Barriers to Discharge: Continued Medical Work up  Expected Discharge Plan and Services Expected Discharge Plan: [place] In-house Referral: Clinical Social Work Discharge Planning Services: CM Consult Post Acute Care Choice: [place] arrangements for the past 2 months: Single Family Home Expected Discharge Date: 06/13/19                           [facility]: [facility] Date [place]: 06/12/19 Time [place]: [phone] Representative spoke with at [place]: [place] ([place]) Interventions    Readmission Risk Interventions Readmission Risk Prevention Plan 05/09/2018  Transportation Screening  Complete  PCP or Specialist Appt within 3-5 Days Complete  HRI or [place] Complete  Social Work Consult for [place] Planning/Counseling [place] Screening Not Applicable  Medication Review Press photographer) Complete  Some recent data might be hidden

## 2019-06-15 NOTE — Progress Notes (Signed)
   Subjective: 3 Days Post-Op Procedure(s) (LRB): RIGHT TOTAL HIP ARTHROPLASTY DIRECT ANTERIOR (Right) Patient reports pain as mild.   " can I go home tomorrow?"  Objective: Vital signs in last 24 hours: Temp:  [98 F (36.7 C)-98.7 F (37.1 C)] 98 F (36.7 C) (04/26 0806) Pulse Rate:  [90-106] 99 (04/26 0806) Resp:  [15-17] 15 (04/26 0806) BP: (124-167)/(69-90) 124/69 (04/26 0806) SpO2:  [93 %-99 %] 93 % (04/26 0806)  Intake/Output from previous day: 04/25 0701 - 04/26 0700 In: 1126 [P.O.:720; I.V.:50; Blood:356] Out: 2600 [Urine:2600] Intake/Output this shift: No intake/output data recorded.  Recent Labs    06/13/19 0522 06/14/19 0600 06/14/19 2035  HGB 8.4* 7.9* 9.6*   Recent Labs    06/14/19 0600 06/14/19 2035  WBC 6.4 6.8  RBC 2.70* 3.27*  HCT 23.9* 28.9*  PLT 161 170   Recent Labs    06/13/19 0522  NA 133*  K 4.4  CL 100  CO2 23  BUN 22  CREATININE 1.32*  GLUCOSE 322*  CALCIUM 8.7*   No results for input(s): LABPT, INR in the last 72 hours.  2 small spots on dressing old blood. thigh soft.  No results found.  Assessment/Plan: 3 Days Post-Op Procedure(s) (LRB): RIGHT TOTAL HIP ARTHROPLASTY DIRECT ANTERIOR (Right) Up with therapy, cannot get up without 2 plus assist. Unable to do own toileting and sit unsupported. Will need SNF  Anthony Dunlap 06/15/2019, 8:27 AM

## 2019-06-15 NOTE — Plan of Care (Signed)
  Problem: Safety: Goal: Ability to remain free from injury will improve Outcome: Progressing   Problem: Education: Goal: Knowledge of the prescribed therapeutic regimen will improve Outcome: Progressing Goal: Understanding of discharge needs will improve Outcome: Progressing   Problem: Activity: Goal: Ability to avoid complications of mobility impairment will improve Outcome: Progressing   Problem: Pain Management: Goal: Pain level will decrease with appropriate interventions Outcome: Progressing

## 2019-06-16 ENCOUNTER — Telehealth: Payer: Self-pay | Admitting: Orthopaedic Surgery

## 2019-06-16 LAB — GLUCOSE, CAPILLARY
Glucose-Capillary: 183 mg/dL — ABNORMAL HIGH (ref 70–99)
Glucose-Capillary: 201 mg/dL — ABNORMAL HIGH (ref 70–99)

## 2019-06-16 MED ORDER — HYDROCODONE-ACETAMINOPHEN 10-325 MG PO TABS: 1.0000 | ORAL_TABLET | Freq: Four times a day (QID) | ORAL | 0 refills | Status: DC | PRN

## 2019-06-16 NOTE — Progress Notes (Signed)
Physical Therapy Treatment Patient Details Name: Anthony Dunlap MRN: [id number] DOB: [birth year] Today's Date: 06/16/2019    History of Present Illness Pt is an 86 y/o male s/p R THA, direct anterior. PMH includes DM, hepatits, prostate cancer, and osteomyelitis of pelivic region.     PT Comments    Pt reclined in chair on arrival and having slid forward in recliner.  Upon standing he presents with clear watery bowel incontinence.  Informed nursing and MD of occurrence. Pt required increased assistance to rise into standing.  Plan for SNF remains appropriate.  Per chart will d/c to [facility] later this pm.      Follow Up Recommendations  SNF;Supervision/Assistance - 24 hour     Equipment Recommendations  Wheelchair (measurements PT);Wheelchair cushion (measurements PT)    Recommendations for Other Services       Precautions / Restrictions Precautions Precautions: Fall Restrictions Weight Bearing Restrictions: Yes RLE Weight Bearing: Weight bearing as tolerated    Mobility  Bed Mobility Overal bed mobility: Needs Assistance Bed Mobility: Sit to Supine       Sit to supine: Min assist   General bed mobility comments: min assistance to return back to supine and position LEs in bed.  Transfers Overall transfer level: Needs assistance Equipment used: Rolling walker (2 wheeled) Transfers: Sit to/from Stand Sit to Stand: Mod assist         General transfer comment: Cues for hand placement and forward weight shifting to rise into standing.  Increased assistance to rise into standing.  Ambulation/Gait Ambulation/Gait assistance: Min assist Gait Distance (Feet): 200 Feet Assistive device: Rolling walker (2 wheeled) Gait Pattern/deviations: Step-through pattern;Trunk flexed;Decreased stance time - right;Decreased dorsiflexion - right;Antalgic;Decreased stride length Gait velocity: decreased   General Gait Details: Cues for RW position and safety.  Cues to maintain  upper trunk control.  Improved gt speed and decreased assistance this session.  Reports," It feels good to walk."   Stairs             Wheelchair Mobility    Modified Rankin (Stroke Patients Only)       Balance Overall balance assessment: Needs assistance Sitting-balance support: Feet supported;Bilateral upper extremity supported Sitting balance-Leahy Scale: Fair       Standing balance-Leahy Scale: Poor Standing balance comment: continued external assistance and BUE support.                            Cognition Arousal/Alertness: Awake/alert Behavior During Therapy: Impulsive Overall Cognitive Status: Impaired/Different from baseline Area of Impairment: Orientation;Following commands;Safety/judgement                 Orientation Level: Disoriented to;Place;Situation     Following Commands: Follows one step commands with increased time Safety/Judgement: Decreased awareness of safety;Decreased awareness of deficits   Problem Solving: Requires verbal cues;Difficulty sequencing General Comments: Pt reoriented several times and remains unable to recall where he is and why he is here.  Informed nurse that he is more confused today than he has been.      Exercises Total Joint Exercises Ankle Circles/Pumps: AROM;Both;20 reps;Supine Quad Sets: AROM;Right;10 reps;Supine Heel Slides: AROM;Right;10 reps;Supine Hip ABduction/ADduction: AROM;Right;10 reps;Supine Long Arc Quad: Right;10 reps;Seated    General Comments        Pertinent Vitals/Pain Pain Assessment: Faces Faces Pain Scale: Hurts little more Pain Location: R hip Pain Descriptors / Indicators: Operative site guarding Pain Intervention(s): Monitored during session;Repositioned    Home Living  Prior Function            PT Goals (current goals can now be found in the care plan section) Acute Rehab PT Goals Patient Stated Goal: to go home Potential to  Achieve Goals: Fair Progress towards PT goals: Progressing toward goals    Frequency    7X/week      PT Plan Current plan remains appropriate    Co-evaluation              AM-PAC PT "6 Clicks" Mobility   Outcome Measure  Help needed turning from your back to your side while in a flat bed without using bedrails?: A Little Help needed moving from lying on your back to sitting on the side of a flat bed without using bedrails?: A Little Help needed moving to and from a bed to a chair (including a wheelchair)?: A Little Help needed standing up from a chair using your arms (e.g., wheelchair or bedside chair)?: A Little Help needed to walk in hospital room?: A Little Help needed climbing 3-5 steps with a railing? : A Lot 6 Click Score: 17    End of Session Equipment Utilized During Treatment: Gait belt Activity Tolerance: Patient tolerated treatment well Patient left: with chair alarm set;in chair;with call bell/phone within reach Nurse Communication: Mobility status(informed nurse of continued watery clear bowel movements.) PT Visit Diagnosis: Unsteadiness on feet (R26.81);Muscle weakness (generalized) (M62.81)     Time: [id number] PT Time Calculation (min) (ACUTE ONLY): 18 min  Charges:  $Gait Training: 8-22 mins                     Anthony Dunlap , PTA Acute Rehabilitation Services Pager [phone] Office [phone]     Anthony Dunlap Anthony Dunlap 06/16/2019, 5:17 PM

## 2019-06-16 NOTE — Telephone Encounter (Signed)
Patient's caregiver call.   He is requesting that a order for a walker be sent to [facility]   Call back: [phone]

## 2019-06-16 NOTE — TOC Transition Note (Addendum)
Transition of Care [facility]) - CM/SW Discharge Note   Patient Details  Name: ZACCARY KITZ MRN: [id number] Date of Birth: [birth year]  Transition of Care [facility]) CM/SW Contact:  Sharin Mons, RN Phone Number: [phone] 06/16/2019, 2:03 PM   Clinical Narrative:     Patient will DC to: [facility] [place] Anticipated DC date: 06/16/2019 Family notified: Mariann Laster ( daughter) Transport by: Corey Harold   Per MD patient ready for DC today to [facility] . RN, patient, patient's family, and facility notified of DC. Discharge Summary and FL2 sent to facility. RN to call report prior to discharge [phone]). DC packet on chart. Ambulance transport requested for patient.   RNCM will sign off for now as intervention is no longer needed. Please consult [place] again if new needs arise.  [name] (Daughter) Mariann Laster    [phone]      Final next level of care: [place] Barriers to Discharge: No Barriers Identified   Patient Goals and CMS Choice Patient states their goals for this hospitalization and ongoing recovery are:: Get stronger CMS Medicare.gov Compare Post Acute Care list provided to:: Patient(and daughter) Choice offered to / list presented to : Patient, Adult Children  Discharge Placement                       Discharge Plan and Services In-house Referral: Clinical Social Work Discharge Planning Services: CM Consult Post Acute Care Choice: [place]: [facility] Date [place]: 06/12/19 Time [place]: [phone] Representative spoke with at [place]: [place] ([place]) Interventions     Readmission Risk Interventions Readmission Risk Prevention Plan 05/09/2018  Transportation Screening Complete  PCP or Specialist Appt within 3-5 Days Complete  HRI or [place] Complete  Social Work Consult for [place]  Planning/Counseling [place] Screening Not Applicable  Medication Review Press photographer) Complete  Some recent data might be hidden

## 2019-06-16 NOTE — TOC Progression Note (Addendum)
Transition of Care [facility]) - Progression Note    Patient Details  Name: Anthony Dunlap MRN: [id number] Date of Birth: 1933/07/16  Transition [facility] Care [facility]) CM/SW Contact  Sharin Mons, RN Phone Number: [phone] 06/16/2019, 10:02 AM  Clinical Narrative:    Insurance authorization for SNF pending. Pt will transition to [facility] [place]. Bed offer extended and accepted by pt. Per [facility] admission if pt has received COVID vaccines updated COVID will not be needed. Pt's daughter states pt has had both COVID vaccines and will produce card for facility.  TOC team will continue to monitor ...  06/16/2019 @ 1:54 pm NCM received SNF authorization from [facility], [id number] x 3 days, next review 4/29. Fax # [phone]. Shawn Key CM.  Reference ID [id number]  Expected Discharge Plan: Skilled Nursing Facility([facility] [place]) Barriers to Discharge: Insurance Authorization  Expected Discharge Plan and Services Expected Discharge Plan: [place]) In-house Referral: Clinical Social Work Discharge Planning Services: CM Consult Post Acute Care Choice: [place] arrangements for the past 2 months: Single Family Home Expected Discharge Date: 06/13/19                           [facility] Date [place]: 06/12/19 Time [place]: [phone] Representative spoke with at [place]: [place] ([place]) Interventions    Readmission Risk Interventions Readmission Risk Prevention Plan 05/09/2018  Transportation Screening Complete  PCP or Specialist Appt within 3-5 Days Complete  HRI or [place] Complete  Social Work Consult for [place] Planning/Counseling [place] Not Applicable  Medication Review Press photographer) Complete  Some recent data might be hidden

## 2019-06-16 NOTE — Progress Notes (Addendum)
   Subjective: 4 Days Post-Op Procedure(s) (LRB): RIGHT TOTAL HIP ARTHROPLASTY DIRECT ANTERIOR (Right) Patient reports pain as mild.  Walking a little better. Still needs assistance to get up, bowel incontinence with therapy session.  Objective: Vital signs in last 24 hours: Temp:  [98 F (36.7 C)-99 F (37.2 C)] 99 F (37.2 C) (04/27 0749) Pulse Rate:  [90-99] 90 (04/27 0749) Resp:  [15-17] 16 (04/27 0749) BP: (119-129)/(67-99) 119/67 (04/27 0749) SpO2:  [93 %-99 %] 97 % (04/27 0749)  Intake/Output from previous day: 04/26 0701 - 04/27 0700 In: 360 [P.O.:360] Out: 1450 [Urine:1450] Intake/Output this shift: No intake/output data recorded.  Recent Labs    06/14/19 0600 06/14/19 2035  HGB 7.9* 9.6*   Recent Labs    06/14/19 0600 06/14/19 2035  WBC 6.4 6.8  RBC 2.70* 3.27*  HCT 23.9* 28.9*  PLT 161 170   No results for input(s): NA, K, CL, CO2, BUN, CREATININE, GLUCOSE, CALCIUM in the last 72 hours. No results for input(s): LABPT, INR in the last 72 hours.  Neurologically intact No results found.  Assessment/Plan: 4 Days Post-Op Procedure(s) (LRB): RIGHT TOTAL HIP ARTHROPLASTY DIRECT ANTERIOR (Right) Up with therapy Discharge to SNF when bed available.  Needs to stay on Augmentin 875 mg po bid for life.  Marybelle Killings 06/16/2019, 7:57 AM

## 2019-06-16 NOTE — Telephone Encounter (Signed)
Please advise 

## 2019-06-16 NOTE — Progress Notes (Signed)
Physical Therapy Treatment Patient Details Name: Anthony Dunlap MRN: [id number] DOB: 05/24/1933 Today's Date: 06/16/2019    History of Present Illness Pt is an 86 y/o male s/p R THA, direct anterior. PMH includes DM, hepatits, prostate cancer, and osteomyelitis of pelivic region.     PT Comments    Pt supine in bed on arrival.  He is only oriented to himself and time.  He reports he is at home and required cueing to recall that he has had hip surgery.  Despite confusion he continues to make functional gains and able to perform gt and transfers with min guard to min assistance.  Continue to recommend SNF placement at d/c as he lacks support at home.      Follow Up Recommendations  SNF;Supervision/Assistance - 24 hour     Equipment Recommendations  Wheelchair (measurements PT);Wheelchair cushion (measurements PT)    Recommendations for Other Services       Precautions / Restrictions Precautions Precautions: Fall Restrictions Weight Bearing Restrictions: Yes RLE Weight Bearing: Weight bearing as tolerated    Mobility  Bed Mobility Overal bed mobility: Needs Assistance Bed Mobility: Supine to Sit;Sit to Supine     Supine to sit: Min assist     General bed mobility comments: Min assistance with heavy use of rails to rise into a seated position.  Transfers Overall transfer level: Needs assistance Equipment used: Rolling walker (2 wheeled) Transfers: Sit to/from Stand Sit to Stand: Min assist;From elevated surface         General transfer comment: Cues for hand placement and forward weight shifting to rise into standing.  Ambulation/Gait Ambulation/Gait assistance: Min guard Gait Distance (Feet): 200 Feet Assistive device: Rolling walker (2 wheeled) Gait Pattern/deviations: Step-through pattern;Trunk flexed;Decreased stance time - right;Decreased dorsiflexion - right;Antalgic;Decreased stride length     General Gait Details: Cues for RW position and safety.  Cues  to maintain upper trunk control.  Improved gt speed and decreased assistance this session.  Reports," It feels good to walk."   Stairs             Wheelchair Mobility    Modified Rankin (Stroke Patients Only)       Balance Overall balance assessment: Needs assistance Sitting-balance support: Feet supported;Bilateral upper extremity supported Sitting balance-Leahy Scale: Fair Sitting balance - Comments: with bed elevated posterior bias is less able to sit unsupported edge of bed.     Standing balance-Leahy Scale: Poor Standing balance comment: continued external assistance and BUE support.                            Cognition Arousal/Alertness: Awake/alert Behavior During Therapy: Impulsive Overall Cognitive Status: Impaired/Different from baseline Area of Impairment: Orientation;Following commands;Safety/judgement                 Orientation Level: Disoriented to;Place;Situation(with cues able to report he has had surgery, he reports he is at his home and the exercise place.)     Following Commands: Follows one step commands with increased time Safety/Judgement: Decreased awareness of safety;Decreased awareness of deficits   Problem Solving: Requires verbal cues;Difficulty sequencing General Comments: Pt reoriented several times and remains unable to recall where he is and why he is here.  Informed nurse that he is more confused today than he has been.      Exercises Total Joint Exercises Ankle Circles/Pumps: AROM;Both;20 reps;Supine Quad Sets: AROM;Right;10 reps;Supine Heel Slides: AROM;Right;10 reps;Supine Hip ABduction/ADduction: AROM;Right;10 reps;Supine  General Comments        Pertinent Vitals/Pain Pain Assessment: Faces Faces Pain Scale: Hurts little more Pain Location: R hip Pain Descriptors / Indicators: Operative site guarding Pain Intervention(s): Monitored during session;Repositioned    Home Living                       Prior Function            PT Goals (current goals can now be found in the care plan section) Acute Rehab PT Goals Patient Stated Goal: to go home Potential to Achieve Goals: Fair Progress towards PT goals: Progressing toward goals    Frequency    7X/week      PT Plan Current plan remains appropriate    Co-evaluation              AM-PAC PT "6 Clicks" Mobility   Outcome Measure  Help needed turning from your back to your side while in a flat bed without using bedrails?: A Little Help needed moving from lying on your back to sitting on the side of a flat bed without using bedrails?: A Little Help needed moving to and from a bed to a chair (including a wheelchair)?: A Little Help needed standing up from a chair using your arms (e.g., wheelchair or bedside chair)?: A Little Help needed to walk in hospital room?: A Little Help needed climbing 3-5 steps with a railing? : A Lot 6 Click Score: 17    End of Session Equipment Utilized During Treatment: Gait belt Activity Tolerance: Patient tolerated treatment well Patient left: with chair alarm set;in chair;with call bell/phone within reach Nurse Communication: Mobility status(informed nurse of increased confusion and pressure on his sacral area from sitting on cup lid in bed.) PT Visit Diagnosis: Unsteadiness on feet (R26.81);Muscle weakness (generalized) (M62.81)     Time: [id number] PT Time Calculation (min) (ACUTE ONLY): 17 min  Charges:  $Gait Training: 8-22 mins                     [name] , PTA Acute Rehabilitation Services Pager [phone] Office [phone]     Sara Selvidge [name] 06/16/2019, 11:02 AM

## 2019-06-16 NOTE — Discharge Summary (Signed)
Patient ID: Anthony Dunlap MRN: [id number] DOB/AGE: [birth year] 86 y.o.  Admit date: 06/12/2019 Discharge date: 06/16/2019  Admission Diagnoses:  Active Problems:   Osteoarthritis of right hip   Arthritis of right hip   Discharge Diagnoses:  Active Problems:   Osteoarthritis of right hip   Arthritis of right hip  status post Procedure(s): RIGHT TOTAL HIP ARTHROPLASTY DIRECT ANTERIOR Chronic Osteomyelitis of pubis    Past Medical History:  Diagnosis Date  . 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 Diabetes ([place])   . Drug-induced hepatitis 06/05/2018  . Inguinal hernia    06/09/2019: per patient " a long time ago on the right side"  . Left eye pain   . Localized osteoarthrosis of right hip 01/05/2019  . Prostate cancer [facility])     Surgeries: Procedure(s): RIGHT TOTAL HIP ARTHROPLASTY DIRECT ANTERIOR on 06/12/2019   Consultants:   Discharged Condition: Improved  Hospital Course: Anthony Dunlap is an 86 y.o. male who was admitted 06/12/2019 for operative treatment of right hip arthritis. Patient failed conservative treatments (please see the history and physical for the specifics) and had severe unremitting pain that affects sleep, daily activities and work/hobbies. After pre-op clearance, the patient was taken to the operating room on 06/12/2019 and underwent  Procedure(s): RIGHT TOTAL HIP ARTHROPLASTY DIRECT ANTERIOR.    Patient was given perioperative antibiotics:  Anti-infectives (From admission, onward)   Start     Dose/Rate Route Frequency Ordered Stop   06/15/19 1000  amoxicillin-clavulanate (AUGMENTIN) 875-125 MG per tablet 1 tablet    Note to Pharmacy: Chronic use on it forever per ID team.  DO NOT STOP   1 tablet Oral Every 12 hours 06/15/19 0840     06/12/19 1800  ceFAZolin (ANCEF) IVPB 1 g/50 mL premix     1 g 100 mL/hr over 30 Minutes Intravenous Every 8 hours 06/12/19 1345 06/13/19 0315   06/12/19 0815  ceFAZolin (ANCEF) IVPB  2g/100 mL premix     2 g 200 mL/hr over 30 Minutes Intravenous On call to O.R. 06/12/19 0801 06/12/19 1042       Patient was given sequential compression devices and early ambulation to prevent DVT.   Patient benefited maximally from hospital stay and there were no complications. At the time of discharge, the patient was urinating/moving their bowels without difficulty, tolerating a regular diet, pain is controlled with oral pain medications and they have been cleared by PT/OT.   Patient has a history of chronic osteomyelitis of the pubis and is on long-term Augmentin for the rest of his life.  He must continue this while at the skilled nursing facility.  Followed by infectious disease.      Recent vital signs:  Patient Vitals for the past 24 hrs:  BP Temp Temp src Pulse Resp SpO2  06/16/19 0749 119/67 99 F (37.2 C) Oral 90 16 97 %  06/15/19 1951 (!) 129/99 98 F (36.7 C) Oral 97 17 97 %  06/15/19 1349 125/71 98.2 F (36.8 C) Oral 93 16 99 %     Recent laboratory studies:  Recent Labs    06/14/19 0600 06/14/19 2035  WBC 6.4 6.8  HGB 7.9* 9.6*  HCT 23.9* 28.9*  PLT 161 170     Discharge Medications:   Allergies as of 06/16/2019      Reactions   Fluconazole Other (See Comments)   Drug-induced hepatitis      Medication List    STOP taking these  medications   acetaminophen 325 MG tablet Commonly known as: TYLENOL   meloxicam 7.5 MG tablet Commonly known as: MOBIC     TAKE these medications   ALPRAZolam 0.5 MG tablet Commonly known as: XANAX Take 0.5 mg by mouth 3 (three) times daily as needed.   amitriptyline 10 MG tablet Commonly known as: ELAVIL Take 10 mg by mouth at bedtime.   amoxicillin-clavulanate 875-125 MG tablet Commonly known as: AUGMENTIN TAKE 1 TABLET BY MOUTH TWICE DAILY   aspirin 325 MG EC tablet Take 1 tablet (325 mg total) by mouth daily with breakfast. Must take at least 4 weeks postop for DVT prophylaxis What changed:   medication  strength  how much to take  when to take this  additional instructions   ferrous sulfate 325 (65 FE) MG tablet Take 1 tablet (325 mg total) by mouth daily with breakfast. What changed: when to take this   furosemide 20 MG tablet Commonly known as: LASIX Take 20 mg by mouth every morning.   glimepiride 4 MG tablet Commonly known as: AMARYL Take 4 mg by mouth every morning.   HYDROcodone-acetaminophen 10-325 MG tablet Commonly known as: Norco Take 1 tablet by mouth every 6 (six) hours as needed.   insulin aspart 100 UNIT/ML injection Commonly known as: novoLOG Inject 0-15 Units into the skin 3 (three) times daily with meals. Sliding scale insulin Less than 70 initiate hypoglycemia protocol 70-120  0 units 120-150 2 unit 151-200 3 units 201-250 3 units 251-300 5 units 301-350 8 units 351-400 11 units  Greater than 400 15 units , call MD What changed:   when to take this  additional instructions   levothyroxine 100 MCG tablet Commonly known as: SYNTHROID Take 100 mcg by mouth every morning.   nystatin powder Commonly known as: MYCOSTATIN/NYSTOP Apply topically 4 (four) times daily.   pravastatin 10 MG tablet Commonly known as: PRAVACHOL Take 10 mg by mouth every evening.   primidone 50 MG tablet Commonly known as: MYSOLINE Take 100 mg by mouth at bedtime.   Soliqua 100-33 UNT-MCG/ML Sopn Generic drug: Insulin Glargine-Lixisenatide Inject 44 Units as directed every morning.       Diagnostic Studies: DG Chest 2 View  Result Date: 06/09/2019 CLINICAL DATA:  86 year old male under preoperative evaluation prior to right total hip arthroplasty. EXAM: CHEST - 2 VIEW COMPARISON:  Chest x-ray 05/08/2018. FINDINGS: Lung volumes are normal. No consolidative airspace disease. No pleural effusions. No pneumothorax. No pulmonary nodule or mass noted. Pulmonary vasculature and the cardiomediastinal silhouette are within normal limits. IMPRESSION: No radiographic  evidence of acute cardiopulmonary disease. Electronically Signed   By: Vinnie Langton M.D.   On: 06/09/2019 16:52   DG C-Arm 1-60 Min  Result Date: 06/12/2019 CLINICAL DATA:  Right hip replacement. FLUOROSCOPY TIME:  22 seconds. Images: 2 EXAM: OPERATIVE RIGHT HIP (WITH PELVIS IF PERFORMED) 2 VIEWS TECHNIQUE: Fluoroscopic spot image(s) were submitted for interpretation post-operatively. COMPARISON:  None. FINDINGS: The patient is status post right hip replacement by the end of the study. Hardware is in good position on AP imaging. IMPRESSION: Right hip replacement as above. Electronically Signed   By: Dorise Bullion III M.D   On: 06/12/2019 14:31   DG Hip Port Unilat With Pelvis 1V Right  Result Date: 06/12/2019 CLINICAL DATA:  Status post right hip replacement EXAM: DG HIP (WITH OR WITHOUT PELVIS) 1V PORT RIGHT COMPARISON:  None. FINDINGS: Patient is status post right hip replacement. Acetabular and femoral components are in good  position. Skin staples are identified. Degenerative changes are seen in the left hip. IMPRESSION: Right hip replacement as above. Electronically Signed   By: Dorise Bullion III M.D   On: 06/12/2019 14:32   DG HIP OPERATIVE UNILAT W OR W/O PELVIS RIGHT  Result Date: 06/12/2019 CLINICAL DATA:  Right hip replacement. FLUOROSCOPY TIME:  22 seconds. Images: 2 EXAM: OPERATIVE RIGHT HIP (WITH PELVIS IF PERFORMED) 2 VIEWS TECHNIQUE: Fluoroscopic spot image(s) were submitted for interpretation post-operatively. COMPARISON:  None. FINDINGS: The patient is status post right hip replacement by the end of the study. Hardware is in good position on AP imaging. IMPRESSION: Right hip replacement as above. Electronically Signed   By: Dorise Bullion III M.D   On: 06/12/2019 14:31    Discharge Instructions    Call MD / Call 911   Complete by: As directed    If you experience chest pain or shortness of breath, CALL 911 and be transported to the hospital emergency room.  If you develope  a fever above 101.5 F, pus (white drainage) or increased drainage or redness at the wound, or calf pain, call your surgeon's office.   Constipation Prevention   Complete by: As directed    Drink plenty of fluids.  Prune juice may be helpful.  You may use a stool softener, such as Colace (over the counter) 100 mg twice a day.  Use MiraLax (over the counter) for constipation as needed.   Driving restrictions   Complete by: As directed    No driving while taking narcotic pain meds.   Increase activity slowly as tolerated   Complete by: As directed       Follow-up Information    Schedule an appointment as soon as possible for a visit with Marybelle Killings, MD.   Specialty: Orthopedic Surgery Why: need return office visit 2 weeks postop Contact information: [place] [address] [phone]        [facility] Follow up.   Specialty: Home Health Services Why: Home Health RN/PT/OT/aide services arranged. They will contact you about 48 hours after discahrge to arrange visit.  Contact information: [address] [place] [address] [phone]           Discharge Plan:  discharge to skilled nursing facility  Disposition:     Signed: Benjiman Core 06/16/2019, 9:31 AM

## 2019-06-16 NOTE — Telephone Encounter (Signed)
OK to send Rx for folding walker with 5 inch front wheels     post op THA thanks

## 2019-06-16 NOTE — Plan of Care (Signed)
  Problem: Safety: Goal: Ability to remain free from injury will improve Outcome: Progressing   Problem: Education: Goal: Knowledge of the prescribed therapeutic regimen will improve Outcome: Progressing Goal: Understanding of discharge needs will improve Outcome: Progressing

## 2019-06-16 NOTE — Plan of Care (Signed)
  Problem: Safety: Goal: Ability to remain free from injury will improve Outcome: Progressing   Problem: Education: Goal: Knowledge of the prescribed therapeutic regimen will improve Outcome: Progressing Goal: Understanding of discharge needs will improve Outcome: Progressing Goal: Individualized Educational Video(s) Outcome: Progressing   Problem: Activity: Goal: Ability to avoid complications of mobility impairment will improve Outcome: Progressing   Problem: Clinical Measurements: Goal: Postoperative complications will be avoided or minimized Outcome: Progressing   Problem: Pain Management: Goal: Pain level will decrease with appropriate interventions Outcome: Progressing   Problem: Skin Integrity: Goal: Will show signs of wound healing Outcome: Progressing

## 2019-06-16 NOTE — Plan of Care (Signed)
Pt discharging to SNF, called report at 1500. Discharge instructions explained and pt verbalized understanding. Personal belongings returned to pt. No questions or concerns voiced at this time. Transportation has arrived to pick up pt.   Problem: Safety: Goal: Ability to remain free from injury will improve 06/16/2019 1459 by Stevan Born, RN Outcome: Completed/Met 06/16/2019 1213 by Stevan Born, RN Outcome: Progressing   Problem: Education: Goal: Knowledge of the prescribed therapeutic regimen will improve 06/16/2019 1459 by Stevan Born, RN Outcome: Completed/Met 06/16/2019 1213 by Stevan Born, RN Outcome: Progressing Goal: Understanding of discharge needs will improve 06/16/2019 1459 by Stevan Born, RN Outcome: Completed/Met 06/16/2019 1213 by Stevan Born, RN Outcome: Progressing Goal: Individualized Educational Video(s) 06/16/2019 1459 by Stevan Born, RN Outcome: Completed/Met 06/16/2019 1213 by Stevan Born, RN Outcome: Progressing   Problem: Activity: Goal: Ability to avoid complications of mobility impairment will improve 06/16/2019 1459 by Stevan Born, RN Outcome: Completed/Met 06/16/2019 1213 by Stevan Born, RN Outcome: Progressing Goal: Ability to tolerate increased activity will improve Outcome: Completed/Met   Problem: Clinical Measurements: Goal: Postoperative complications will be avoided or minimized 06/16/2019 1459 by Stevan Born, RN Outcome: Completed/Met 06/16/2019 1213 by Stevan Born, RN Outcome: Progressing   Problem: Pain Management: Goal: Pain level will decrease with appropriate interventions 06/16/2019 1459 by Stevan Born, RN Outcome: Completed/Met 06/16/2019 1213 by Stevan Born, RN Outcome: Progressing   Problem: Skin Integrity: Goal: Will show signs of wound healing 06/16/2019 1459 by Stevan Born, RN Outcome: Completed/Met 06/16/2019 1213 by Stevan Born, RN Outcome:  Progressing

## 2019-06-17 NOTE — Telephone Encounter (Signed)
Faxed to [facility] 

## 2019-06-19 ENCOUNTER — Encounter: Payer: Self-pay | Admitting: Anesthesiology

## 2019-06-19 MED ORDER — BUPIVACAINE IN DEXTROSE 0.75-8.25 % IT SOLN
INTRATHECAL | Status: DC | PRN
  Administered 2019-06-12: 2 mL via INTRATHECAL

## 2019-06-19 NOTE — Anesthesia Postprocedure Evaluation (Signed)
Anesthesia Post Note  Patient: Anthony Dunlap  Procedure(s) Performed: RIGHT TOTAL HIP ARTHROPLASTY DIRECT ANTERIOR (Right Hip)     Patient location during evaluation: PACU Anesthesia Type: MAC and Spinal Level of consciousness: awake and alert Pain management: pain level controlled Vital Signs Assessment: post-procedure vital signs reviewed and stable Respiratory status: spontaneous breathing, nonlabored ventilation, respiratory function stable and patient connected to nasal cannula oxygen Cardiovascular status: stable and blood pressure returned to baseline Postop Assessment: no apparent nausea or vomiting and spinal receding Anesthetic complications: no    Last Vitals:  Vitals:   06/15/19 1951 06/16/19 0749  BP: (!) 129/99 119/67  Pulse: 97 90  Resp: 17 16  Temp: 36.7 C 37.2 C  SpO2: 97% 97%    Last Pain:  Vitals:   06/16/19 0840  TempSrc:   PainSc: 0-No pain                 Suvi Archuletta

## 2019-06-19 NOTE — Anesthesia Procedure Notes (Signed)
Spinal  Patient location during procedure: OR Start time: 06/12/2019 10:21 AM End time: 06/12/2019 10:26 AM Staffing Performed: anesthesiologist  Anesthesiologist: Oleta Mouse, MD Preanesthetic Checklist Completed: patient identified, IV checked, risks and benefits discussed, surgical consent, monitors and equipment checked, pre-op evaluation and timeout performed Spinal Block Patient position: sitting Prep: DuraPrep Patient monitoring: heart rate, cardiac monitor, continuous pulse ox and blood pressure Approach: midline Location: L4-5 Injection technique: single-shot Needle Needle type: Pencan  Needle gauge: 24 G Needle length: 9 cm Assessment Sensory level: T6

## 2019-06-25 ENCOUNTER — Other Ambulatory Visit: Payer: Self-pay

## 2019-06-25 ENCOUNTER — Encounter: Payer: Self-pay | Admitting: Orthopaedic Surgery

## 2019-06-25 ENCOUNTER — Ambulatory Visit (INDEPENDENT_AMBULATORY_CARE_PROVIDER_SITE_OTHER): Payer: Medicare Other

## 2019-06-25 ENCOUNTER — Ambulatory Visit (INDEPENDENT_AMBULATORY_CARE_PROVIDER_SITE_OTHER): Payer: Medicare Other | Admitting: Orthopaedic Surgery

## 2019-06-25 VITALS — Ht 68.0 in | Wt 174.0 lb

## 2019-06-25 DIAGNOSIS — Z96641 Presence of right artificial hip joint: Secondary | ICD-10-CM | POA: Diagnosis not present

## 2019-06-25 NOTE — Progress Notes (Signed)
Post-Op Visit Note   Patient: Anthony Dunlap           Date of Birth: 06/18/1933           MRN: [id number] Visit Date: 06/25/2019 PCP: [name], MD   Assessment & Plan: Follow-up right total of arthroplasty.  Staples removed incision looks good.  His sugars are run a little bit high and he needs to go back on his 50 Soliqua units daily.  He continues for life on his Augmentin 875 p.o. twice daily.  Has appointment with infectious disease coming up.  Recheck 5 weeks.  X-ray showed good position alignment.  Chief Complaint:  Chief Complaint  Patient presents with  . Right Hip - Routine Post Op    06/12/2019 Right THA   Visit Diagnoses:  1. Status post total hip replacement, right     Plan: continue ambulation. He is taking no pain medication.  ROV 5 wks  Follow-Up Instructions: No follow-ups on file.   Orders:  Orders Placed This Encounter  Procedures  . XR HIP UNILAT W OR W/O PELVIS 2-3 VIEWS RIGHT   No orders of the defined types were placed in this encounter.   Imaging: No results found.  PMFS History: Patient Active Problem List   Diagnosis Date Noted  . Arthritis of right hip 06/12/2019  . Osteoarthritis of right hip 01/05/2019  . Pelvic abscess in male [facility]) 09/11/2018  . Drug-induced hepatitis 06/05/2018  . Calculus of gallbladder without cholecystitis without obstruction   . Fungemia/Candida albicans 05/10/2018  . UTI (urinary tract infection) 05/08/2018  . Complicated UTI (urinary tract infection) 05/08/2018  . 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 ([place]) 04/21/2018  . Abscess of left thigh   . Pressure injury of skin 02/10/2018  . Enterococcus faecalis infection 01/27/2018  . Abscess   . 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 Sepsis due to Bacteroides species ([place]) 01/22/2018  . Sepsis without acute organ dysfunction ([place])   . DKA (diabetic ketoacidoses) ([place]) 01/17/2018  . Normocytic anemia 01/17/2018  . Chronic anemia 01/03/2018    . Chronic indwelling Foley catheter 01/03/2018  . Elevated alkaline phosphatase level 01/03/2018  . Hypothyroidism 01/03/2018  . Physical deconditioning 01/03/2018  . 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 HLD (hyperlipidemia) 08/19/2013  . Type 2 diabetes mellitus ([place])   . Prostate cancer [facility])    Past Medical History:  Diagnosis Date  . 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 ([place]) 04/21/2018  . Diabetes ([place])   . Drug-induced hepatitis 06/05/2018  . Inguinal hernia    06/09/2019: per patient " a long time ago on the right side"  . Left eye pain   . Localized osteoarthrosis of right hip 01/05/2019  . Prostate cancer [facility])     Family History  Problem Relation Age of Onset  . Pneumonia Father   . Cancer Sister     Past Surgical History:  Procedure Laterality Date  . HERNIA REPAIR    . IR RADIOLOGIST EVAL & MGMT  03/06/2018  . PROSTATE SURGERY    . TOTAL HIP ARTHROPLASTY Right 06/12/2019   Procedure: RIGHT TOTAL HIP ARTHROPLASTY DIRECT ANTERIOR;  Surgeon: Marybelle Killings, MD;  Location: [facility];  Service: Orthopedics;  Laterality: Right;   Social History   Occupational History    Employer: RETIRED    Comment: Retired  Tobacco Use  . Smoking status: Never Smoker  . Smokeless tobacco: Never Used  Substance and Sexual Activity  .  Alcohol use: No  . Drug use: No  . Sexual activity: Not on file

## 2019-07-05 ENCOUNTER — Other Ambulatory Visit (INDEPENDENT_AMBULATORY_CARE_PROVIDER_SITE_OTHER): Payer: Self-pay

## 2019-07-06 ENCOUNTER — Other Ambulatory Visit: Payer: Self-pay | Admitting: Orthopaedic Surgery

## 2019-07-06 MED ORDER — HYDROCODONE-ACETAMINOPHEN 5-325 MG PO TABS: 1.0000 | ORAL_TABLET | Freq: Four times a day (QID) | ORAL | 0 refills | Status: DC | PRN

## 2019-07-06 NOTE — Telephone Encounter (Signed)
I called his care giver, sent in norco 5    # 30 one po q 6 hrs prn pain , does not need the 10mg  .   FYI

## 2019-07-06 NOTE — Progress Notes (Unsigned)
norco

## 2019-07-24 ENCOUNTER — Encounter (HOSPITAL_BASED_OUTPATIENT_CLINIC_OR_DEPARTMENT_OTHER): Payer: Medicare Other | Attending: Internal Medicine | Admitting: Internal Medicine

## 2019-07-24 DIAGNOSIS — E1169 Type 2 diabetes mellitus with other specified complication: Secondary | ICD-10-CM | POA: Diagnosis not present

## 2019-07-24 DIAGNOSIS — Z8349 Family history of other endocrine, nutritional and metabolic diseases: Secondary | ICD-10-CM | POA: Diagnosis not present

## 2019-07-24 DIAGNOSIS — Z96641 Presence of right artificial hip joint: Secondary | ICD-10-CM | POA: Insufficient documentation

## 2019-07-24 DIAGNOSIS — Z8546 Personal history of malignant neoplasm of prostate: Secondary | ICD-10-CM | POA: Diagnosis not present

## 2019-07-24 DIAGNOSIS — L89623 Pressure ulcer of left heel, stage 3: Secondary | ICD-10-CM | POA: Insufficient documentation

## 2019-07-24 DIAGNOSIS — I10 Essential (primary) hypertension: Secondary | ICD-10-CM | POA: Diagnosis not present

## 2019-07-24 DIAGNOSIS — Z883 Allergy status to other anti-infective agents status: Secondary | ICD-10-CM | POA: Diagnosis not present

## 2019-07-24 DIAGNOSIS — Z8249 Family history of ischemic heart disease and other diseases of the circulatory system: Secondary | ICD-10-CM | POA: Diagnosis not present

## 2019-07-24 DIAGNOSIS — M869 Osteomyelitis, unspecified: Secondary | ICD-10-CM | POA: Insufficient documentation

## 2019-07-24 DIAGNOSIS — E11621 Type 2 diabetes mellitus with foot ulcer: Secondary | ICD-10-CM | POA: Insufficient documentation

## 2019-07-24 DIAGNOSIS — E1142 Type 2 diabetes mellitus with diabetic polyneuropathy: Secondary | ICD-10-CM | POA: Insufficient documentation

## 2019-07-24 DIAGNOSIS — E785 Hyperlipidemia, unspecified: Secondary | ICD-10-CM | POA: Diagnosis not present

## 2019-07-24 DIAGNOSIS — M1990 Unspecified osteoarthritis, unspecified site: Secondary | ICD-10-CM | POA: Diagnosis not present

## 2019-07-24 DIAGNOSIS — E1151 Type 2 diabetes mellitus with diabetic peripheral angiopathy without gangrene: Secondary | ICD-10-CM | POA: Diagnosis not present

## 2019-07-24 DIAGNOSIS — L97518 Non-pressure chronic ulcer of other part of right foot with other specified severity: Secondary | ICD-10-CM | POA: Diagnosis not present

## 2019-07-24 DIAGNOSIS — E039 Hypothyroidism, unspecified: Secondary | ICD-10-CM | POA: Diagnosis not present

## 2019-07-27 ENCOUNTER — Other Ambulatory Visit (HOSPITAL_COMMUNITY): Payer: Self-pay | Admitting: Internal Medicine

## 2019-07-27 ENCOUNTER — Other Ambulatory Visit: Payer: Self-pay | Admitting: Internal Medicine

## 2019-07-27 DIAGNOSIS — M869 Osteomyelitis, unspecified: Secondary | ICD-10-CM

## 2019-07-28 NOTE — Progress Notes (Addendum)
Skolnick Orell Keturah Barre ([id number]) , Visit Report for 07/24/2019 Chief Complaint Document Details Patient Name: Date of Service: KRISTOF, NADEEM ID D. 07/24/2019 9:00 A M Medical Record Number: [id number] Patient Account Number: [id number] Date of Birth/Sex: Treating RN: May 29, 1933 (86 y.o. Male) Kela Millin Primary Care Provider: [name] Other Clinician: Referring Provider: Treating Provider/Extender: Garfield Cornea in Treatment: 0 Information Obtained from: Patient Chief Complaint 07/24/2019; patient is here for review of wounds on his bilateral lower legs Electronic Signature(s) Signed: 07/27/2019 5:22:40 PM By: Linton Ham MD Entered By: Linton Ham on 07/24/2019 11:23:13 -------------------------------------------------------------------------------- Debridement Details Patient Name: Date of Service: [name] 07/24/2019 9:00 A M Medical Record Number: [id number] Patient Account Number: [id number] Date of Birth/Sex: Treating RN: [birth year] (86 y.o. Male) Kela Millin Primary Care Provider: [name] Other Clinician: Referring Provider: Treating Provider/Extender: Garfield Cornea in Treatment: 0 Debridement Performed for Assessment: Wound #1 Left Calcaneus Performed By: Physician Ricard Dillon., MD Debridement Type: Chemical/Enzymatic/Mechanical Agent Used: Santyl Level of Consciousness (Pre-procedure): Awake and Alert Pre-procedure Verification/Time Out Yes - 11:27 Taken: Start Time: 11:27 Pain Control: Other : benzocaine, 20% Bleeding: None End Time: 11:28 Procedural Pain: 0 Post Procedural Pain: 0 Response to Treatment: Procedure was tolerated well Level of Consciousness (Post- Awake and Alert procedure): Post Debridement Measurements of Total Wound Length: (cm) 5 Stage: Category/Stage III Width: (cm) 4.6 Depth: (cm) 0.1 Volume: (cm) 1.806 Character of Wound/Ulcer Post Debridement:  Requires Further Debridement Post Procedure Diagnosis Same as Pre-procedure Electronic Signature(s) Signed: 07/24/2019 4:23:17 PM By: Kela Millin Signed: 07/27/2019 5:22:40 PM By: Linton Ham MD Entered By: Kela Millin on 07/24/2019 11:28:45 -------------------------------------------------------------------------------- Debridement Details Patient Name: Date of Service: [name] 07/24/2019 9:00 A M Medical Record Number: [id number] Patient Account Number: [id number] Date of Birth/Sex: Treating RN: 28-Aug-1933 (86 y.o. Male) Kela Millin Primary Care Provider: [name] Other Clinician: Referring Provider: Treating Provider/Extender: Garfield Cornea in Treatment: 0 Debridement Performed for Assessment: Wound #2 Right Calcaneus Performed By: Physician Ricard Dillon., MD Debridement Type: Chemical/Enzymatic/Mechanical Agent Used: Santyl Level of Consciousness (Pre-procedure): Awake and Alert Pre-procedure Verification/Time Out Yes - 11:27 Taken: Start Time: 11:27 Pain Control: Other : benzocaine, 20% Bleeding: None End Time: 11:28 Procedural Pain: 0 Post Procedural Pain: 0 Response to Treatment: Procedure was tolerated well Level of Consciousness (Post- Awake and Alert procedure): Post Debridement Measurements of Total Wound Length: (cm) 5 Stage: Unstageable/Unclassified Width: (cm) 5.2 Depth: (cm) 0.1 Volume: (cm) 2.042 Character of Wound/Ulcer Post Debridement: Requires Further Debridement Post Procedure Diagnosis Same as Pre-procedure Electronic Signature(s) Signed: 07/24/2019 4:23:17 PM By: Kela Millin Signed: 07/27/2019 5:22:40 PM By: Linton Ham MD Entered By: Kela Millin on 07/24/2019 11:30:00 -------------------------------------------------------------------------------- Debridement Details Patient Name: Date of Service: [name] 07/24/2019 9:00 A M Medical Record Number:  [id number] Patient Account Number: [id number] Date of Birth/Sex: Treating RN: 1933-12-29 (86 y.o. Male) Kela Millin Primary Care Provider: [name] Other Clinician: Referring Provider: Treating Provider/Extender: Garfield Cornea in Treatment: 0 Debridement Performed for Assessment: Wound #3 Right T Fifth oe Performed By: Physician Ricard Dillon., MD Debridement Type: Chemical/Enzymatic/Mechanical Agent Used: Santyl Severity of Tissue Pre Debridement: Fat layer exposed Level of Consciousness (Pre-procedure): Awake and Alert Pre-procedure Verification/Time Out Yes - 11:27 Taken: Start Time: 11:27 Pain Control: Other : benzocaine, 20% Bleeding: None End Time: 11:28 Procedural Pain: 0 Post Procedural Pain: 0 Response to Treatment: Procedure was tolerated well  Level of Consciousness (Post- Awake and Alert procedure): Post Debridement Measurements of Total Wound Length: (cm) 0.2 Width: (cm) 0.6 Depth: (cm) 0.1 Volume: (cm) 0.009 Character of Wound/Ulcer Post Debridement: Requires Further Debridement Severity of Tissue Post Debridement: Fat layer exposed Post Procedure Diagnosis Same as Pre-procedure Electronic Signature(s) Signed: 07/24/2019 4:23:17 PM By: Kela Millin Signed: 07/27/2019 5:22:40 PM By: Linton Ham MD Entered By: Kela Millin on 07/24/2019 11:31:01 -------------------------------------------------------------------------------- Debridement Details Patient Name: Date of Service: [name] 07/24/2019 9:00 A M Medical Record Number: [id number] Patient Account Number: [id number] Date of Birth/Sex: Treating RN: Jan 15, 1934 (86 y.o. Male) Kela Millin Primary Care Provider: [name] Other Clinician: Referring Provider: Treating Provider/Extender: Garfield Cornea in Treatment: 0 Debridement Performed for Assessment: Wound #4 Right,Lateral Foot Performed By: Physician  Ricard Dillon., MD Debridement Type: Chemical/Enzymatic/Mechanical Agent Used: Santyl Severity of Tissue Pre Debridement: Fat layer exposed Level of Consciousness (Pre-procedure): Awake and Alert Pre-procedure Verification/Time Out Yes - 11:27 Taken: Start Time: 11:27 Pain Control: Other : benzocaine, 20% Bleeding: None End Time: 11:28 Procedural Pain: 0 Post Procedural Pain: 0 Response to Treatment: Procedure was tolerated well Level of Consciousness (Post- Awake and Alert procedure): Post Debridement Measurements of Total Wound Length: (cm) 1.1 Width: (cm) 1.3 Depth: (cm) 0.4 Volume: (cm) 0.449 Character of Wound/Ulcer Post Debridement: Requires Further Debridement Severity of Tissue Post Debridement: Fat layer exposed Post Procedure Diagnosis Same as Pre-procedure Electronic Signature(s) Signed: 07/24/2019 4:23:17 PM By: Kela Millin Signed: 07/27/2019 5:22:40 PM By: Linton Ham MD Entered By: Kela Millin on 07/24/2019 11:31:42 -------------------------------------------------------------------------------- HPI Details Patient Name: Date of Service: [name] 07/24/2019 9:00 A M Medical Record Number: [id number] Patient Account Number: [id number] Date of Birth/Sex: Treating RN: Jan 11, 1934 (86 y.o. Male) Kela Millin Primary Care Provider: [name] Other Clinician: Referring Provider: Treating Provider/Extender: Garfield Cornea in Treatment: 0 History of Present Illness HPI Description: ADMISSION 07/24/2019 This is an 86 year old man who has a complicated past medical history however he is a type II diabetic. He recently had a right total hip replacement was hospitalized from 4/23 through 4/27. He was discharged to [facility] in [place] and now has returned to his home in [place]. He comes in today with his caregiver. Apparently he had the open wound on the right lateral fifth metatarsal head even before he had the  hip surgery but did not have any other wounds. He has returned home with 2 large probable pressure areas in both heels. They have home health they have been using Medihoney however it has been recently suggested the use Santyl prescribed by Dr. Gerarda Fraction The patient has an unstageable wound on the right calcaneus and a least a stage III pressure ulcer on the left. The area on the right fifth metatarsal head also has a necrotic surface and he has a small slitlike area just above the right fifth metatarsal head which seems more superficial. He does not have a known history of PAD. 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 He also had an abscess on his thigh with wound culture showing Enterococcus and Klebsiella. He required admission from 05/08/18 through 05/14/2018 with Candida albicans sepsis which was treated. He was readmitted again from 08/22/2018 through 09/14/2018 with again pubic symphysis abscess. He is now on chronic Augmentin because of this  and followed by [name] ABIs in our clinic were 0.71 in the right and 0.78 on the left Electronic Signature(s) Signed: 07/27/2019 5:22:40 PM By: Linton Ham MD Entered By: Linton Ham on 07/24/2019 11:27:21 -------------------------------------------------------------------------------- Physical Exam Details Patient Name: Date of Service: [name] 07/24/2019 9:00 A M Medical Record Number: [id number] Patient Account Number: [id number] Date of Birth/Sex: Treating RN: [birth year] (86 y.o. Male) Kela Millin Primary Care Provider: [name] Other Clinician: Referring Provider: Treating Provider/Extender: Garfield Cornea in Treatment: 0 Constitutional Patient is hypertensive.. Pulse regular and  within target range for patient.[name] Respirations regular, non-labored and within target range.. Temperature is normal and within the target range for the patient.[name] Appears in no distress certainly does not look medically unwell. Respiratory work of breathing is normal. Bilateral breath sounds are clear and equal in all lobes with no wheezes, rales or rhonchi.. Cardiovascular Heart rhythm and rate regular, without murmur or gallop.[name] He does have palpable femoral and popliteal pulses bilaterally. Pedal pulses absent bilaterally.. Gastrointestinal (GI) Abdomen is soft and non-distended without masses or tenderness.. No liver or spleen enlargement. Neurological Completely absent vibration in light touch up until the upper levels of his calves. Psychiatric appears at normal baseline. Notes Wound exam; The area on the right fifth metatarsal head is apparently a more chronic wound. This does not have a viable surface. Very little tissue over bone. Small slitlike injury just above this appears more superficial Large necrotic surface over the right heel ulcer. This is going to require debridement although I wanted to see arterial studies before we did this. The area on the left lateral heel has a better looking surface but still required some debridement ultimately. Electronic Signature(s) Signed: 07/27/2019 5:22:40 PM By: Linton Ham MD Entered By: Linton Ham on 07/24/2019 11:28:58 -------------------------------------------------------------------------------- Physician Orders Details Patient Name: Date of Service: [name] 07/24/2019 9:00 A M Medical Record Number: [id number] Patient Account Number: [id number] Date of Birth/Sex: Treating RN: 06/13/1933 (86 y.o. Male) Kela Millin Primary Care Provider: [name] Other Clinician: Referring Provider: Treating Provider/Extender: Garfield Cornea in Treatment: 0 Verbal / Phone Orders: No Diagnosis  Coding Follow-up Appointments Return Appointment in 2 weeks. Dressing Change Frequency Wound #1 Left Calcaneus Change dressing every day. Wound #2 Right Calcaneus Change dressing every day. Wound #3 Right T Fifth oe Change dressing every day. Wound #4 Right,Lateral Foot Change dressing every day. Wound Cleansing Wound #1 Left Calcaneus Clean wound with Normal Saline. - or wound cleanser Wound #2 Right Calcaneus Clean wound with Normal Saline. - or wound cleanser Wound #3 Right T Fifth oe Clean wound with Normal Saline. - or wound cleanser Wound #4 Right,Lateral Foot Clean wound with Normal Saline. - or wound cleanser Primary Wound Dressing Wound #1 Left Calcaneus Santyl Ointment - apply lightly moistened saline gauze over santyl Wound #2 Right Calcaneus Santyl Ointment - apply lightly moistened saline gauze over santyl Wound #3 Right T Fifth oe Santyl Ointment - apply lightly moistened saline gauze over santyl Wound #4 Right,Lateral Foot Santyl Ointment - apply lightly moistened saline gauze over santyl Secondary Dressing Wound #1 Left Calcaneus Kerlix/Rolled Gauze Dry Gauze Heel Cup Wound #2 Right Calcaneus Kerlix/Rolled Gauze Dry Gauze Heel Cup Wound #3 Right T Fifth oe Kerlix/Rolled Gauze Dry Gauze Wound #4 Right,Lateral Foot Kerlix/Rolled Gauze Dry Gauze Off-Loading Other: - offloading heel sandal to both [facility] for wound care. - [facility]  X-ray, foot, Right Foot - X-ray of right foot, wounds to feet. HGD:J24.268 [id number]:96222 X-ray, foot, left foot. - X-ray of left foot, wounds to feed ICD:E11.621 LNL:[id number] [employer] rterial Dopplers, Bilateral - Arterial Dopplers -Bilateral, including ABI's and TBI's. Wounds to Left and Right foot. HER:D40.814 A Electronic Signature(s) Signed: 07/30/2019 4:59:25 PM By: Kela Millin Signed: 08/03/2019 8:32:12 AM By: Linton Ham MD Previous Signature:  07/24/2019 4:23:17 PM Version By: Kela Millin Previous Signature: 07/27/2019 5:22:40 PM Version By: Linton Ham MD Entered By: Kela Millin on 07/30/2019 13:06:39 Prescription 07/24/2019 Losier Geovanni D. -------------------------------------------------------------------------------- , Linton Ham MD Patient Name: Provider: 12-Dec-1933 [id number] Date of Birth: NPI#: Male [id number] Sex: DEA #: [phone] [id number] Phone #: License #: [place] Patient Address: [place] [address] [place], [place] [address] [place], [place] [address] [phone] Allergies Name Reaction Severity fluconazole Provider's Orders rterial Dopplers, Bilateral - Arterial Dopplers -Bilateral, including ABI's and TBI's. Wounds to Left and Right foot. TKP:T46.568 A Hand Signature: Date(s): Prescription 07/24/2019 S[place] Nicolaos Vernell Morgans MD Patient Name: Provider: 03/03/1933 [id number] Date of Birth: NPI#: Male [id number] Sex: DEA #: [phone] [id number] Phone #: License #: [place] Patient Address: [address] [place], [place] [address] [place], [place] [address] [phone] Allergies Name Reaction Severity fluconazole Provider's Orders X-ray, foot, Right Foot - X-ray of right foot, wounds to feet. HLK:T62.563 [id number]:[id number] Hand Signature: Date(s): Prescription 07/24/2019 Gent Florencio Vernell Morgans MD Patient Name: Provider: 1933/06/01 [id number] Date of Birth: NPI#: Male [id number] Sex: DEA #: [phone] [id number] Phone #: License #: [place] Patient Address: [address], [place] [address] [place],  [address] [phone] Allergies Name Reaction Severity fluconazole Provider's Orders X-ray, foot, left foot. - X-ray of left foot, wounds to  feed UEK:C00.349 ZPH:[id number] Hand Signature: Date(s): Electronic Signature(s) Signed: 07/30/2019 4:59:25 PM By: Kela Millin Signed: 08/03/2019 8:32:12 AM By: Linton Ham MD Previous Signature: 07/24/2019 4:23:17 PM Version By: Kela Millin Previous Signature: 07/27/2019 5:22:40 PM Version By: Linton Ham MD Entered By: Kela Millin on 07/30/2019 13:06:40 -------------------------------------------------------------------------------- Problem List Details Patient Name: Date of Service: [name] 07/24/2019 9:00 A M Medical Record Number: [id number] Patient Account Number: [id number] Date of Birth/Sex: Treating RN: 04-25-1933 (86 y.o. Male) Kela Millin Primary Care Provider: [name] Other Clinician: Referring Provider: Treating Provider/Extender: Garfield Cornea in Treatment: 0 Active Problems ICD-10 Encounter Code Description Active Date MDM Diagnosis E11.621 Type 2 diabetes mellitus with foot ulcer 07/24/2019 No Yes E11.51 Type 2 diabetes mellitus with diabetic peripheral angiopathy without gangrene 07/24/2019 No Yes L89.610 Pressure ulcer of right heel, unstageable 07/24/2019 No Yes L89.623 Pressure ulcer of left heel, stage 3 07/24/2019 No Yes L97.518 Non-pressure chronic ulcer of other part of right foot with other specified 07/24/2019 No Yes severity E11.42 Type 2 diabetes mellitus with diabetic polyneuropathy 07/24/2019 No Yes Inactive Problems Resolved Problems Electronic Signature(s) Signed: 07/27/2019 5:22:40 PM By: Linton Ham MD Entered By: Linton Ham on 07/24/2019 11:15:25 -------------------------------------------------------------------------------- Progress Note Details Patient Name: Date of Service: [name] 07/24/2019 9:00 A M Medical Record Number: [id number] Patient Account Number: [id number] Date of Birth/Sex: Treating RN: 30-Apr-1933 (86 y.o. Male) Kela Millin Primary Care Provider:  [name] Other Clinician: Referring Provider: Treating Provider/Extender: Fabio Neighbors  Weeks in Treatment: 0 Subjective Chief Complaint Information obtained from Patient 07/24/2019; patient is here for review of wounds on his bilateral lower legs History of Present Illness (HPI) ADMISSION 07/24/2019 This is an 86 year old man who has a complicated past medical history however he is a type II diabetic. He recently had a right total hip replacement was hospitalized from 4/23 through 4/27. He was discharged to [facility] in [place] and now has returned to his home in [place]. He comes in today with his caregiver. Apparently he had the open wound on the right lateral fifth metatarsal head even before he had the hip surgery but did not have any other wounds. He has returned home with 2 large probable pressure areas in both heels. They have home health they have been using Medihoney however it has been recently suggested the use Santyl prescribed by Dr. Gerarda Fraction The patient has an unstageable wound on the right calcaneus and a least a stage III pressure ulcer on the left. The area on the right fifth metatarsal head also has a necrotic surface and he has a small slitlike area just above the right fifth metatarsal head which seems more superficial. He does not have a known history of PAD. 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 He also had an abscess on his thigh with wound culture showing Enterococcus and Klebsiella. He required admission from 05/08/18 through 05/14/2018 with Candida albicans sepsis which was treated. He was readmitted again from 08/22/2018 through 09/14/2018 with again pubic symphysis abscess. He is now on chronic Augmentin because of this and followed by  [name] ABIs in our clinic were 0.71 in the right and 0.78 on the left Patient History Information obtained from Patient. Allergies fluconazole Family History Cancer - Child, Hypertension - Child, Thyroid Problems - Child, No family history of Diabetes, Heart Disease, Hereditary Spherocytosis, Kidney Disease, Lung Disease, Seizures, Stroke, Tuberculosis. Social History Never smoker, Marital Status - Married, Alcohol Use - Never, Drug Use - No History, Caffeine Use - Daily. Medical History Eyes Denies history of Cataracts, Glaucoma, Optic Neuritis Ear/Nose/Mouth/Throat Denies history of Chronic sinus problems/congestion, Middle ear problems Hematologic/Lymphatic Denies history of Anemia, Hemophilia, Human Immunodeficiency Virus, Lymphedema, Sickle Cell Disease Respiratory Denies history of Aspiration, Asthma, Chronic Obstructive Pulmonary Disease (COPD), Pneumothorax, Sleep Apnea Cardiovascular Patient has history of Hypertension Denies history of Angina, Arrhythmia, Congestive Heart Failure, Coronary Artery Disease, Deep Vein Thrombosis, Hypotension, Myocardial Infarction, Peripheral Arterial Disease, Peripheral Venous Disease, Phlebitis, Vasculitis Gastrointestinal Denies history of Cirrhosis , Colitis, Crohnoos, Hepatitis A, Hepatitis B, Hepatitis C Endocrine Patient has history of Type II Diabetes Denies history of Type I Diabetes Genitourinary Denies history of End Stage Renal Disease Immunological Denies history of Lupus Erythematosus, Raynaudoos, Scleroderma Integumentary (Skin) Denies history of History of Burn Musculoskeletal Patient has history of Osteomyelitis - pelvic thigh Denies history of Gout, Rheumatoid Arthritis, Osteoarthritis Neurologic Denies history of Dementia, Neuropathy, Quadriplegia, Paraplegia, Seizure Disorder Oncologic Patient has history of Received Radiation - 2006 Denies history of Received Chemotherapy Psychiatric Denies history of  Anorexia/bulimia Patient is treated with Insulin. Blood sugar is not tested. Review of Systems (ROS) Constitutional Symptoms (General Health) Denies complaints or symptoms of Fatigue, Fever, Chills, Marked Weight Change. Eyes Denies complaints or symptoms of Dry Eyes, Vision Changes, Glasses / Contacts. Ear/Nose/Mouth/Throat Denies complaints or symptoms of Chronic sinus  problems or rhinitis. Respiratory Denies complaints or symptoms of Chronic or frequent coughs, Shortness of Breath. Cardiovascular Denies complaints or symptoms of Chest pain. Gastrointestinal Denies complaints or symptoms of Frequent diarrhea, Nausea, Vomiting. Endocrine Denies complaints or symptoms of Heat/cold intolerance. Genitourinary Denies complaints or symptoms of Frequent urination. Integumentary (Skin) Denies complaints or symptoms of Wounds. Musculoskeletal Denies complaints or symptoms of Muscle Pain, Muscle Weakness. Neurologic Denies complaints or symptoms of Numbness/parasthesias. Psychiatric Denies complaints or symptoms of Claustrophobia, Suicidal. Objective Constitutional Patient is hypertensive.. Pulse regular and within target range for patient.[name] Respirations regular, non-labored and within target range.. Temperature is normal and within the target range for the patient.[name] Appears in no distress certainly does not look medically unwell. Vitals Time Taken: 9:37 AM, Height: 68 in, Source: Stated, Weight: 172 lbs, Source: Stated, BMI: 26.1, Temperature: 98.6 F, Pulse: 105 bpm, Respiratory Rate: 18 breaths/min, Blood Pressure: 170/93 mmHg, Capillary Blood Glucose: 220 mg/dl. Respiratory work of breathing is normal. Bilateral breath sounds are clear and equal in all lobes with no wheezes, rales or rhonchi.. Cardiovascular Heart rhythm and rate regular, without murmur or gallop.[name] He does have palpable femoral and popliteal pulses bilaterally. Pedal pulses absent bilaterally.. Gastrointestinal  (GI) Abdomen is soft and non-distended without masses or tenderness.. No liver or spleen enlargement. Neurological Completely absent vibration in light touch up until the upper levels of his calves. Psychiatric appears at normal baseline. General Notes: Wound exam; ooThe area on the right fifth metatarsal head is apparently a more chronic wound. This does not have a viable surface. Very little tissue over bone. Small slitlike injury just above this appears more superficial ooLarge necrotic surface over the right heel ulcer. This is going to require debridement although I wanted to see arterial studies before we did this. The area on the left lateral heel has a better looking surface but still required some debridement ultimately. Integumentary (Hair, Skin) Wound #1 status is Open. Original cause of wound was Gradually Appeared. The wound is located on the Left Calcaneus. The wound measures 5cm length x 4.6cm width x 0.1cm depth; 18.064cm^2 area and 1.806cm^3 volume. There is Fat Layer (Subcutaneous Tissue) Exposed exposed. There is no tunneling or undermining noted. There is a medium amount of serosanguineous drainage noted. There is small (1-33%) pink granulation within the wound bed. There is a large (67-100%) amount of necrotic tissue within the wound bed including Eschar and Adherent Slough. Wound #2 status is Open. Original cause of wound was Gradually Appeared. The wound is located on the Right Calcaneus. The wound measures 5cm length x 5.2cm width x 0.1cm depth; 20.42cm^2 area and 2.042cm^3 volume. There is Fat Layer (Subcutaneous Tissue) Exposed exposed. There is no tunneling or undermining noted. There is a medium amount of serosanguineous drainage noted. There is small (1-33%) red, pink granulation within the wound bed. There is a large (67-100%) amount of necrotic tissue within the wound bed including Eschar and Adherent Slough. Wound #3 status is Open. Original cause of wound was  Gradually Appeared. The wound is located on the Right T Fifth. The wound measures 0.2cm length x oe 0.6cm width x 0.1cm depth; 0.094cm^2 area and 0.009cm^3 volume. There is Fat Layer (Subcutaneous Tissue) Exposed exposed. There is no tunneling or undermining noted. There is a medium amount of serosanguineous drainage noted. There is large (67-100%) red granulation within the wound bed. There is no necrotic tissue within the wound bed. Wound #4 status is Open. Original cause of wound was Gradually Appeared. The wound  is located on the Right,Lateral Foot. The wound measures 1.1cm length x 1.3cm width x 0.4cm depth; 1.123cm^2 area and 0.449cm^3 volume. There is Fat Layer (Subcutaneous Tissue) Exposed exposed. There is no tunneling or undermining noted. There is a medium amount of serosanguineous drainage noted. There is a large (67-100%) amount of necrotic tissue within the wound bed including Adherent Slough. 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 There was a Chemical/Enzymatic/Mechanical debridement performed by Ricard Dillon., MD. after achieving pain control using Other (benzocaine, 20%). Agent used was [employer]. A time out was conducted at 11:27, prior to the start of the procedure. There was no bleeding. The procedure was tolerated well with a pain level of 0 throughout and a pain level of 0 following the procedure. Post Debridement Measurements: 5cm length x 4.6cm width x 0.1cm depth; 1.806cm^3 volume. Post debridement Stage noted as  Category/Stage III. Character of Wound/Ulcer Post Debridement requires further debridement. Post procedure Diagnosis Wound #1: Same as Pre-Procedure Plan Follow-up Appointments: Return Appointment in 2 weeks. Dressing Change Frequency: Wound #1 Left Calcaneus: Change dressing every day. Wound #2 Right Calcaneus: Change dressing every day. Wound #3 Right T Fifth: oe Change dressing every day. Wound #4 Right,Lateral Foot: Change dressing every day. Wound Cleansing: Wound #1 Left Calcaneus: Clean wound with Normal Saline. - or wound cleanser Wound #2 Right Calcaneus: Clean wound with Normal Saline. - or wound cleanser Wound #3 Right T Fifth: oe Clean wound with Normal Saline. - or wound cleanser Wound #4 Right,Lateral Foot: Clean wound with Normal Saline. - or wound cleanser Primary Wound Dressing: Wound #1 Left Calcaneus: Santyl Ointment - apply lightly moistened saline gauze over santyl Wound #2 Right Calcaneus: Santyl Ointment - apply lightly moistened saline gauze over santyl Wound #3 Right T Fifth: oe Santyl Ointment - apply lightly moistened saline gauze over santyl Wound #4 Right,Lateral Foot: Santyl Ointment - apply lightly moistened saline gauze over santyl Secondary Dressing: Wound #1 Left Calcaneus: Kerlix/Rolled Gauze Dry Gauze Heel Cup Wound #2 Right Calcaneus: Kerlix/Rolled Gauze Dry Gauze Heel Cup Wound #3 Right T Fifth: oe Kerlix/Rolled Gauze Dry Gauze Wound #4 Right,Lateral Foot: Kerlix/Rolled Gauze Dry Gauze Off-Loading: Other: - offloading heel sandal to both feet [facility]: [facility]. - [facility] ordered were: Arterial Dopplers, Bilateral - Arterial Dopplers -Bilateral, including ABI's and TBI's. Interventional Radiology at [facility]. Wounds to Left and Right foot. WUJ:W11.914 Radiology ordered were: X-ray, foot, Right Foot - X-ray of right foot, wounds to feet. At [facility], X-ray,  foot, left foot. - X-ray of left foot, wounds to feed At [facility] 1. I agree with Santyl to all wound areas 2. The patient needs formal arterial studies to include ABIs/TBI's and arterial Dopplers. I would not be surprised if he has significant tibial artery disease and the palpable pulses are probably falsely reassuring secondary to medial classification 3. He had x-rays of both feet looking at the underlying bone in the heels and also the right fifth metatarsal and metatarsal head. 4. They already have an air-fluidized mattress but I emphasized continued offloading of  all the wounds. We gave him bilateral healing sandals. I spent 45 minutes in review of this patient's records, face-to-face evaluation and preparation of this record Electronic Signature(s) Signed: 07/27/2019 5:22:40 PM By: Linton Ham MD Entered By: Linton Ham on 07/24/2019 11:30:23 -------------------------------------------------------------------------------- HxROS Details Patient Name: Date of Service: [name] 07/24/2019 9:00 A M Medical Record Number: [id number] Patient Account Number: [id number] Date of Birth/Sex: Treating RN: 09-28-1933 (86 y.o. Male) Carlene Coria Primary Care Provider: [name] Other Clinician: Referring Provider: Treating Provider/Extender: Garfield Cornea in Treatment: 0 Information Obtained From Patient Constitutional Symptoms (General Health) Complaints and Symptoms: Negative for: Fatigue; Fever; Chills; Marked Weight Change Eyes Complaints and Symptoms: Negative for: Dry Eyes; Vision Changes; Glasses / Contacts Medical History: Negative for: Cataracts; Glaucoma; Optic Neuritis Ear/Nose/Mouth/Throat Complaints and Symptoms: Negative for: Chronic sinus problems or rhinitis Medical History: Negative for: Chronic sinus problems/congestion; Middle ear problems Respiratory Complaints and Symptoms: Negative for: Chronic or frequent  coughs; Shortness of Breath Medical History: Negative for: Aspiration; Asthma; Chronic Obstructive Pulmonary Disease (COPD); Pneumothorax; Sleep Apnea Cardiovascular Complaints and Symptoms: Negative for: Chest pain Medical History: Positive for: Hypertension Negative for: Angina; Arrhythmia; Congestive Heart Failure; Coronary Artery Disease; Deep Vein Thrombosis; Hypotension; Myocardial Infarction; Peripheral Arterial Disease; Peripheral Venous Disease; Phlebitis; Vasculitis Gastrointestinal Complaints and Symptoms: Negative for: Frequent diarrhea; Nausea; Vomiting Medical History: Negative for: Cirrhosis ; Colitis; Crohns; Hepatitis A; Hepatitis B; Hepatitis C Endocrine Complaints and Symptoms: Negative for: Heat/cold intolerance Medical History: Positive for: Type II Diabetes Negative for: Type I Diabetes Time with diabetes: 36 - 48 Treated with: Insulin Blood sugar tested every day: No Genitourinary Complaints and Symptoms: Negative for: Frequent urination Medical History: Negative for: End Stage Renal Disease Integumentary (Skin) Complaints and Symptoms: Negative for: Wounds Medical History: Negative for: History of Burn Musculoskeletal Complaints and Symptoms: Negative for: Muscle Pain; Muscle Weakness Medical History: Positive for: Osteomyelitis - pelvic thigh Negative for: Gout; Rheumatoid Arthritis; Osteoarthritis Neurologic Complaints and Symptoms: Negative for: Numbness/parasthesias Medical History: Negative for: Dementia; Neuropathy; Quadriplegia; Paraplegia; Seizure Disorder Psychiatric Complaints and Symptoms: Negative for: Claustrophobia; Suicidal Medical History: Negative for: Anorexia/bulimia Hematologic/Lymphatic Medical History: Negative for: Anemia; Hemophilia; Human Immunodeficiency Virus; Lymphedema; Sickle Cell Disease Immunological Medical History: Negative for: Lupus Erythematosus; Raynauds; Scleroderma Oncologic Medical  History: Positive for: Received Radiation - 2006 Negative for: Received Chemotherapy Immunizations Pneumococcal Vaccine: Received Pneumococcal Vaccination: No Implantable Devices None Family and Social History Cancer: Yes - Child; Diabetes: No; Heart Disease: No; Hereditary Spherocytosis: No; Hypertension: Yes - Child; Kidney Disease: No; Lung Disease: No; Seizures: No; Stroke: No; Thyroid Problems: Yes - Child; Tuberculosis: No; Never smoker; Marital Status - Married; Alcohol Use: Never; Drug Use: No History; Caffeine Use: Daily; Financial Concerns: No; Food, Clothing or Shelter Needs: No; Support System Lacking: No; Transportation Concerns: No Engineer, maintenance) Signed: 07/27/2019 5:22:40 PM By: Linton Ham MD Signed: 07/28/2019 4:34:33 PM By: Carlene Coria RN Entered By: Carlene Coria on 07/24/2019 09:54:03 -------------------------------------------------------------------------------- SuperBill Details Patient Name: Date of Service: [name] 07/24/2019 Medical Record Number: [id number] Patient Account Number: [id number] Date of Birth/Sex: Treating RN: 03/23/1933 (86 y.o. Male) Kela Millin Primary Care Provider: [name] Other Clinician: Referring Provider: Treating Provider/Extender: Garfield Cornea in Treatment: 0 Diagnosis Coding ICD-10 Codes Code Description [phone] Type 2 diabetes mellitus with foot ulcer E11.51 Type 2 diabetes mellitus with diabetic peripheral angiopathy without gangrene L89.610 Pressure ulcer of right heel, unstageable L89.623 Pressure ulcer of left heel, stage  3 L97.518 Non-pressure chronic ulcer of other part of right foot with other specified severity E11.42 Type 2 diabetes mellitus with diabetic polyneuropathy Facility Procedures CPT4 Code: [id number] Description: [place] VISIT-LEV 3 EST PT Modifier: 25 Quantity: 1 CPT4 Code: [id number] Description: 20802 - DEBRIDE W/O ANES NON  SELECT Modifier: Quantity: 1 CPT4 Code: [id number] Description: 49753 - DEBR[place] W/O ANES NON SELECT Modifier: Quantity: 1 Physician Procedures : CPT4 Code Description Modifier [id number] 11173 - WC PHYS LEVEL 4 - NEW PT ICD-10 Diagnosis Description E11.621 Type 2 diabetes mellitus with foot ulcer E11.51 Type 2 diabetes mellitus with diabetic peripheral angiopathy without gangrene L89.610 Pressure  ulcer of right heel, unstageable L89.623 Pressure ulcer of left heel, stage 3 Quantity: 1 Electronic Signature(s) Signed: 07/24/2019 4:23:17 PM By: Kela Millin Signed: 07/27/2019 5:22:40 PM By: Linton Ham MD Entered By: Kela Millin on 07/24/2019 11:44:49

## 2019-07-28 NOTE — Progress Notes (Signed)
[place] Levorn Keturah Barre ([id number]) , Visit Report for 07/24/2019 Abuse/Suicide Risk Screen Details Patient Name: Date of Service: Anthony Dunlap, Anthony D. 07/24/2019 9:00 A M Medical Record Number: [id number] Patient Account Number: [id number] Date of Birth/Sex: Treating RN: [birth year] (86 y.o. Male) Carlene Coria Primary Care Glendia Olshefski: [name] Other Clinician: Referring Latana Colin: Treating Elwin Tsou/Extender: Garfield Cornea in Treatment: 0 Abuse/Suicide Risk Screen Items Answer ABUSE RISK SCREEN: Has anyone close to you tried to hurt or harm you recentlyo No Do you feel uncomfortable with anyone in your familyo No Has anyone forced you do things that you didnt want to doo No Electronic Signature(s) Signed: 07/28/2019 4:34:33 PM By: Carlene Coria RN Entered By: Carlene Coria on 07/24/2019 09:Anthony Dunlap:13 -------------------------------------------------------------------------------- Activities of Daily Living Details Patient Name: Date of Service: Anthony Dunlap, Anthony ID D. 07/24/2019 9:00 A M Medical Record Number: [id number] Patient Account Number: [id number] Date of Birth/Sex: Treating RN: [birth year] (86 y.o. Male) Carlene Coria Primary Care Betsabe Iglesia: [name] Other Clinician: Referring Calob Baskette: Treating Sante Biedermann/Extender: Garfield Cornea in Treatment: 0 Activities of Daily Living Items Answer Activities of Daily Living (Please select one for each item) Drive Automobile Not Able T Medications ake Completely Able Use T elephone Completely Able Care for Appearance Need Assistance Use T oilet Need Assistance Bath / Shower Need Assistance Dress Self Need Assistance Feed Self Completely Able Walk Need Assistance Get In / Out Bed Need Assistance Housework Not Able Prepare Meals Need Assistance Handle Money Completely Able Shop for Self Need Assistance Electronic Signature(s) Signed: 07/28/2019 4:34:33 PM By: Carlene Coria RN Entered By: Carlene Coria on  07/24/2019 09:55:03 -------------------------------------------------------------------------------- Education Screening Details Patient Name: Date of Service: Anthony Longest ID D. 07/24/2019 9:00 A M Medical Record Number: [id number] Patient Account Number: [id number] Date of Birth/Sex: Treating RN: Apr 13, 1933 (86 y.o. Male) Carlene Coria Primary Care Severin Bou: [name] Other Clinician: Referring Saddie Sandeen: Treating Earnesteen Birnie/Extender: Garfield Cornea in Treatment: 0 Primary Learner Assessed: Patient Learning Preferences/Education Level/Primary Language Learning Preference: Explanation Highest Education Level: High School Preferred Language: English Cognitive Barrier Language Barrier: No Translator Needed: No Memory Deficit: No Emotional Barrier: No Cultural/Religious Beliefs Affecting Medical Care: No Physical Barrier Impaired Vision: No Impaired Hearing: No Decreased Hand dexterity: No Knowledge/Comprehension Comprehension Level: High Ability to understand written instructions: High Ability to understand verbal instructions: High Motivation Anxiety Level: Calm Cooperation: Cooperative Education Importance: Acknowledges Need Interest in Health Problems: Asks Questions Perception: Coherent Willingness to Engage in Self-Management High Activities: Readiness to Engage in Self-Management High Activities: Electronic Signature(s) Signed: 07/28/2019 4:34:33 PM By: Carlene Coria RN Entered By: Carlene Coria on 07/24/2019 09:55:38 -------------------------------------------------------------------------------- Fall Risk Assessment Details Patient Name: Date of Service: Anthony Dunlap, Anthony V ID D. 07/24/2019 9:00 A M Medical Record Number: [id number] Patient Account Number: [id number] Date of Birth/Sex: Treating RN: December 17, 1933 (86 y.o. Male) Carlene Coria Primary Care Laderius Valbuena: [name] Other Clinician: Referring Siana Panameno: Treating Shakyla Nolley/Extender:  Garfield Cornea in Treatment: 0 Fall Risk Assessment Items Have you had 2 or more falls in the last 12 monthso 0 No Have you had any fall that resulted in injury in the last 12 monthso 0 No FALLS RISK SCREEN History of falling - immediate or within 3 months 0 No Secondary diagnosis (Do you have 2 or more medical diagnoseso) 0 No Ambulatory aid None/bed rest/wheelchair/nurse 0 No Crutches/cane/walker 0 No Furniture 0 No Intravenous therapy Access/Saline/Heparin Lock 0 No Gait/Transferring Normal/ bed rest/ wheelchair 0 No Weak (short steps with  or without shuffle, stooped but able to lift head while walking, may seek 0 No support from furniture) Impaired (short steps with shuffle, may have difficulty arising from chair, head down, impaired 0 No balance) Mental Status Oriented to own ability 0 No Electronic Signature(s) Signed: 07/28/2019 4:34:33 PM By: Carlene Coria RN Entered By: Carlene Coria on 07/24/2019 09:55:52 -------------------------------------------------------------------------------- Foot Assessment Details Patient Name: Date of Service: Anthony Longest ID D. 07/24/2019 9:00 A M Medical Record Number: [id number] Patient Account Number: [id number] Date of Birth/Sex: Treating RN: 01-13-1934 (86 y.o. Male) Carlene Coria Primary Care Saiquan Hands: [name] Other Clinician: Referring Makar Slatter: Treating Sylena Lotter/Extender: Garfield Cornea in Treatment: 0 Foot Assessment Items Site Locations + = Sensation present, - = Sensation absent, C = Callus, U = Ulcer R = Redness, W = Warmth, M = Maceration, PU = Pre-ulcerative lesion F = Fissure, S = Swelling, D = Dryness Assessment Right: Left: Other Deformity: No No Prior Foot Ulcer: No No Prior Amputation: No No Charcot Joint: No No Ambulatory Status: Ambulatory Without Help Gait: Steady Electronic Signature(s) Signed: 07/28/2019 4:34:33 PM By: Carlene Coria RN Entered By: Carlene Coria on 07/24/2019 10:02:32 -------------------------------------------------------------------------------- Nutrition Risk Screening Details Patient Name: Date of Service: Anthony Dunlap, Anthony ID D. 07/24/2019 9:00 A M Medical Record Number: [id number] Patient Account Number: [id number] Date of Birth/Sex: Treating RN: June 02, 1933 (86 y.o. Male) Carlene Coria Primary Care Acey [name]: [name] Other Clinician: Referring Yenny Kosa: Treating Phyllis Abelson/Extender: Garfield Cornea in Treatment: 0 Height (in): 68 Weight (lbs): 172 Body Mass Index (BMI): 26.1 Nutrition Risk Screening Items Score Screening NUTRITION RISK SCREEN: I have an illness or condition that made me change the kind and/or amount of food I eat 0 No I eat fewer than two meals per day 0 No I eat few fruits and vegetables, or milk products 0 No I have three or more drinks of beer, liquor or wine almost every day 0 No I have tooth or mouth problems that make it hard for me to eat 0 No I don't always have enough money to buy the food I need 0 No I eat alone most of the time 0 No I take three or more different prescribed or over-the-counter drugs a day 1 Yes Without wanting to, I have lost or gained 10 pounds in the last six months 0 No I am not always physically able to shop, cook and/or feed myself 2 Yes Nutrition Protocols Good Risk Protocol Moderate Risk Protocol 0 Provide education on nutrition High Risk Proctocol Risk Level: Moderate Risk Score: 3 Electronic Signature(s) Signed: 07/28/2019 4:34:33 PM By: Carlene Coria RN Entered By: Carlene Coria on 07/24/2019 09:56:15

## 2019-07-28 NOTE — Progress Notes (Signed)
Anthony Dunlap ([id number]) , Visit Report for 07/24/2019 Allergy List Details Patient Name: Date of Service: Anthony Dunlap, Anthony Dunlap ID D. 07/24/2019 9:00 A M Medical Record Number: [id number] Patient Account Number: [id number] Date of Birth/Sex: Treating RN: 06/14/1933 (86 y.o. Male) Carlene Coria Primary Care Kenzlie Disch: [name] Other Clinician: Referring Rykar Lebleu: Treating Aliece Honold/Extender: Garfield Cornea in Treatment: 0 Allergies Active Allergies fluconazole Allergy Notes Electronic Signature(s) Signed: 07/28/2019 4:34:33 PM By: Carlene Coria RN Entered By: Carlene Coria on 07/24/2019 09:39:34 -------------------------------------------------------------------------------- Arrival Information Details Patient Name: Date of Service: Anthony Dunlap ID D. 07/24/2019 9:00 A M Medical Record Number: [id number] Patient Account Number: [id number] Date of Birth/Sex: Treating RN: 07/31/1933 (86 y.o. Male) Carlene Coria Primary Care Denice Cardon: [name] Other Clinician: Referring Roseline Ebarb: Treating Venancio Chenier/Extender: Garfield Cornea in Treatment: 0 Visit Information Patient Arrived: Charlyn Minerva Time: 09:24 Accompanied By: friend Transfer Assistance: None Patient Identification Verified: Yes Secondary Verification Process Completed: Yes Patient Has Alerts: No Electronic Signature(s) Signed: 07/28/2019 4:34:33 PM By: Carlene Coria RN Entered By: Carlene Coria on 07/24/2019 09:37:46 -------------------------------------------------------------------------------- Clinic Level of Care Assessment Details Patient Name: Date of Service: Anthony, [name] 07/24/2019 9:00 A M Medical Record Number: [id number] Patient Account Number: [id number] Date of Birth/Sex: Treating RN: 1933-04-12 (86 y.o. Male) Anthony Dunlap Primary Care Aaliyha Mumford: Other Clinician: [name] Referring Cray Monnin: Treating Shareen Capwell/Extender: Garfield Cornea in Treatment: 0 Clinic Level of Care Assessment Items TOOL 1 Quantity Score X- 1 0 Use when EandM and Procedure is performed on INITIAL visit ASSESSMENTS - Nursing Assessment / Reassessment X- 1 20 General Physical Exam (combine w/ comprehensive assessment (listed just below) when performed on new pt. evals) X- 1 25 Comprehensive Assessment (HX, ROS, Risk Assessments, Wounds Hx, etc.) ASSESSMENTS - Wound and Skin Assessment / Reassessment '[]'$  - 0 Dermatologic / Skin Assessment (not related to wound area) ASSESSMENTS - Ostomy and/or Continence Assessment and Care '[]'$  - 0 Incontinence Assessment and Management '[]'$  - 0 Ostomy Care Assessment and Management (repouching, etc.) PROCESS - Coordination of Care X - Simple Patient / Family Education for ongoing care 1 15 '[]'$  - 0 Complex (extensive) Patient / Family Education for ongoing care X- 1 10 Staff obtains Programmer, systems, Records, T Results / Process Orders est X- 1 10 Staff telephones HHA, Nursing Homes / Clarify orders / etc '[]'$  - 0 Routine Transfer to another Facility (non-emergent condition) '[]'$  - 0 Routine Hospital Admission (non-emergent condition) X- 1 15 New Admissions / Biomedical engineer / Ordering NPWT Apligraf, etc. , '[]'$  - 0 Emergency Hospital Admission (emergent condition) PROCESS - Special Needs '[]'$  - 0 Pediatric / Minor Patient Management '[]'$  - 0 Isolation Patient Management '[]'$  - 0 Hearing / Language / Visual special needs '[]'$  - 0 Assessment of Community assistance (transportation, D/C planning, etc.) '[]'$  - 0 Additional assistance / Altered mentation '[]'$  - 0 Support Surface(s) Assessment (bed, cushion, seat, etc.) INTERVENTIONS - Miscellaneous '[]'$  - 0 External ear exam '[]'$  - 0 Patient Transfer (multiple staff / Civil Service fast streamer / Similar devices) '[]'$  - 0 Simple Staple / Suture removal (25 or less) '[]'$  - 0 Complex Staple / Suture removal (26 or more) '[]'$  - 0 Hypo/Hyperglycemic Management (do not check  if billed separately) X- 1 15 Ankle / Brachial Index (ABI) - do not check if billed separately Has the patient been seen at the hospital within the last three years: Yes Total Score: 110 Level Of Care: New/Established - Level 3 Electronic  Signature(s) Signed: 07/24/2019 4:23:17 PM By: Anthony Dunlap Entered By: Anthony Dunlap on 07/24/2019 11:26:53 -------------------------------------------------------------------------------- Lower Extremity Assessment Details Patient Name: Date of Service: Anthony Dunlap ID D. 07/24/2019 9:00 A M Medical Record Number: [id number] Patient Account Number: [id number] Date of Birth/Sex: Treating RN: 07-Oct-1933 (86 y.o. Male) Carlene Coria Primary Care Jilliann Subramanian: [name] Other Clinician: Referring Jeremy Ditullio: Treating Lucila Klecka/Extender: Garfield Cornea in Treatment: 0 Edema Assessment Assessed: Shirlyn Goltz: No] [Right: No] E[Left: dema] [Right: :] Calf Left: Right: Point of Measurement: 42 cm From Medial Instep 31 cm 32 cm Ankle Left: Right: Point of Measurement: 10 cm From Medial Instep 21 cm 22 cm Vascular Assessment Blood Pressure: Brachial: [Left:170] [Right:170] Ankle: [Left:Dorsalis Pedis: 120 0.71] [Right:Dorsalis Pedis: 132 0.78] Electronic Signature(s) Signed: 07/28/2019 4:34:33 PM By: Carlene Coria RN Entered By: Carlene Coria on 07/24/2019 10:10:53 -------------------------------------------------------------------------------- Multi Wound Chart Details Patient Name: Date of Service: Anthony Dunlap ID D. 07/24/2019 9:00 A M Medical Record Number: [id number] Patient Account Number: [id number] Date of Birth/Sex: Treating RN: [birth year] (86 y.o. Male) Anthony Dunlap Primary Care Aloys Hupfer: [name] Other Clinician: Referring Elmina Hendel: Treating Emiliya Chretien/Extender: Garfield Cornea in Treatment: 0 Vital Signs Height(in): 68 Capillary Blood Glucose(mg/dl): 220 Weight(lbs):  172 Pulse(bpm): 105 Body Mass Index(BMI): 26 Blood Pressure(mmHg): 170/93 Temperature(F): 98.6 Respiratory Rate(breaths/min): 18 Photos: [1:No Photos Left Calcaneus] [2:No Photos Right Calcaneus] [3:No Photos Right T Fifth oe] Wound Location: [1:Gradually Appeared] [2:Gradually Appeared] [3:Gradually Appeared] Wounding Event: [1:Pressure Ulcer] [2:Pressure Ulcer] [3:Diabetic Wound/Ulcer of the Lower] Primary Etiology: [1:Hypertension, Type II Diabetes,] [2:Hypertension, Type II Diabetes,] [3:Extremity Hypertension, Type II Diabetes,] Comorbid History: [1:Osteomyelitis, Received Radiation 06/06/2019] [2:Osteomyelitis, Received Radiation 06/06/2019] [3:Osteomyelitis, Received Radiation 06/06/2019] Date Acquired: [1:0] [2:0] [3:0] Weeks of Treatment: [1:Open] [2:Open] [3:Open] Wound Status: [1:5x4.6x0.1] [2:5x5.2x0.1] [3:0.2x0.6x0.1] Measurements L x W x D (cm) [1:18.064] [2:20.42] [3:0.094] A (cm) : rea [1:1.806] [2:2.042] [3:0.009] Volume (cm) : [1:Category/Stage III] [2:Unstageable/Unclassified] [3:Grade 2] Classification: [1:Medium] [2:Medium] [3:Medium] Exudate A mount: [1:Serosanguineous] [2:Serosanguineous] [3:Serosanguineous] Exudate Type: [1:red, brown] [2:red, brown] [3:red, brown] Exudate Color: [1:Small (1-33%)] [2:Small (1-33%)] [3:Large (67-100%)] Granulation Amount: [1:Pink] [2:Red, Pink] [3:Red] Granulation Quality: [1:Large (67-100%)] [2:Large (67-100%)] [3:None Present (0%)] Necrotic Amount: [1:Eschar, Adherent Slough] [2:Eschar, Adherent Slough] [3:N/A] Necrotic Tissue: [1:Fat Layer (Subcutaneous Tissue)] [2:Fat Layer (Subcutaneous Tissue)] [3:Fat Layer (Subcutaneous Tissue)] Exposed Structures: [1:Exposed: Yes Fascia: No Tendon: No Muscle: No Joint: No Bone: No None] [2:Exposed: Yes Fascia: No Tendon: No Muscle: No Joint: No Bone: No None] [3:Exposed: Yes Fascia: No Tendon: No Muscle: No Joint: No Bone: No None] Wound Number: 4 N/A N/A Photos: No Photos N/A N/A Right,  Lateral Foot N/A N/A Wound Location: Gradually Appeared N/A N/A Wounding Event: Diabetic Wound/Ulcer of the Lower N/A N/A Primary Etiology: Extremity Hypertension, Type II Diabetes, N/A N/A Comorbid History: Osteomyelitis, Received Radiation 06/06/2019 N/A N/A Date Acquired: 0 N/A N/A Weeks of Treatment: Open N/A N/A Wound Status: 1.1x1.3x0.4 N/A N/A Measurements L x W x D (cm) 1.123 N/A N/A A (cm) : rea 0.449 N/A N/A Volume (cm) : Grade 2 N/A N/A Classification: Medium N/A N/A Exudate A mount: Serosanguineous N/A N/A Exudate Type: red, brown N/A N/A Exudate Color: N/A N/A N/A Granulation A mount: N/A N/A N/A Granulation Quality: N/A N/A N/A Necrotic A mount: Adherent Slough N/A N/A Necrotic Tissue: Fat Layer (Subcutaneous Tissue) N/A N/A Exposed Structures: Exposed: Yes Fascia: No Tendon: No Muscle: No Joint: No Bone: No None N/A N/A Epithelialization: Treatment Notes Electronic Signature(s) Signed: 07/24/2019 4:23:17 PM By: Verita Schneiders,  Larene Beach Signed: 07/27/2019 5:22:40 PM By: Linton Ham MD Entered By: Linton Ham on 07/24/2019 11:22:53 -------------------------------------------------------------------------------- Multi-Disciplinary Care Plan Details Patient Name: Date of Service: Anthony Dunlap ID D. 07/24/2019 9:00 A M Medical Record Number: [id number] Patient Account Number: [id number] Date of Birth/Sex: Treating RN: Feb 18, 1934 (86 y.o. Male) Anthony Dunlap Primary Care Arnella Pralle: [name] Other Clinician: Referring Starnisha Batrez: Treating Teodoro Jeffreys/Extender: Garfield Cornea in Treatment: 0 Active Inactive Nutrition Nursing Diagnoses: Potential for alteratiion in Nutrition/Potential for imbalanced nutrition Goals: Patient/caregiver verbalizes understanding of need to maintain therapeutic glucose control per primary care physician Date Initiated: 07/24/2019 Target Resolution Date: 09/11/2019 Goal Status:  Active Interventions: Provide education on nutrition Notes: Pain, Acute or Chronic Nursing Diagnoses: Pain, acute or chronic: actual or potential Goals: Patient/caregiver will verbalize comfort level met Date Initiated: 07/24/2019 Target Resolution Date: 09/11/2019 Goal Status: Active Interventions: Provide education on pain management Notes: Pressure Nursing Diagnoses: Knowledge deficit related to management of pressures ulcers Goals: Patient/caregiver will verbalize risk factors for pressure ulcer development Date Initiated: 07/24/2019 Target Resolution Date: 09/11/2019 Goal Status: Active Interventions: Provide education on pressure ulcers Notes: Wound/Skin Impairment Nursing Diagnoses: Impaired tissue integrity Goals: Ulcer/skin breakdown will have a volume reduction of 50% by week 8 Date Initiated: 07/24/2019 Target Resolution Date: 09/11/2019 Goal Status: Active Interventions: Provide education on ulcer and skin care Notes: Electronic Signature(s) Signed: 07/24/2019 4:23:17 PM By: Anthony Dunlap Entered By: Anthony Dunlap on 07/24/2019 11:04:33 -------------------------------------------------------------------------------- Pain Assessment Details Patient Name: Date of Service: Anthony Dunlap ID D. 07/24/2019 9:00 A M Medical Record Number: [id number] Patient Account Number: [id number] Date of Birth/Sex: Treating RN: 09-Nov-1933 (86 y.o. Male) Carlene Coria Primary Care Leelynd Maldonado: [name] Other Clinician: Referring Dannya Pitkin: Treating Ireoluwa Gorsline/Extender: Garfield Cornea in Treatment: 0 Active Problems Location of Pain Severity and Description of Pain Patient Has Paino No Site Locations Pain Management and Medication Current Pain Management: Electronic Signature(s) Signed: 07/28/2019 4:34:33 PM By: Carlene Coria RN Entered By: Carlene Coria on 07/24/2019  10:26:02 -------------------------------------------------------------------------------- Patient/Caregiver Education Details Patient Name: Date of Service: Anthony Dunlap ID D. 6/4/2021andnbsp9:00 A M Medical Record Number: [id number] Patient Account Number: [id number] Date of Birth/Gender: Treating RN: 1933-09-07 (86 y.o. Male) Anthony Dunlap Primary Care Physician: [name] Other Clinician: Referring Physician: Treating Physician/Extender: Garfield Cornea in Treatment: 0 Education Assessment Education Provided To: Patient and Caregiver Education Topics Provided Nutrition: Handouts: Elevated Blood Sugars: How Do They Affect Wound Healing Methods: Explain/Verbal Responses: State content correctly Pain: Handouts: A Guide to Pain Control Methods: Explain/Verbal Responses: State content correctly Pressure: Handouts: Pressure Ulcers: Care and Offloading Methods: Explain/Verbal Responses: State content correctly Wound/Skin Impairment: Handouts: Caring for Your Ulcer Methods: Explain/Verbal Responses: State content correctly Electronic Signature(s) Signed: 07/24/2019 4:23:17 PM By: Anthony Dunlap Entered By: Anthony Dunlap on 07/24/2019 11:26:00 -------------------------------------------------------------------------------- Wound Assessment Details Patient Name: Date of Service: Anthony Dunlap ID D. 07/24/2019 9:00 A M Medical Record Number: [id number] Patient Account Number: [id number] Date of Birth/Sex: Treating RN: 1933-06-04 (86 y.o. Male) Carlene Coria Primary Care Aiyah Scarpelli: [name] Other Clinician: Referring Coby Shrewsberry: Treating Mizael Sagar/Extender: Garfield Cornea in Treatment: 0 Wound Status Wound Number: 1 Primary Pressure Ulcer Etiology: Wound Location: Left Calcaneus Wound Status: Open Wounding Event: Gradually Appeared Comorbid Hypertension, Type II Diabetes, Osteomyelitis, Received Date  Acquired: 06/06/2019 History: Radiation Weeks Of Treatment: 0 Clustered Wound: No Wound Measurements Length: (cm) 5 Width: (cm) 4.6 Depth: (cm) 0.1 Area: (cm) 18.064 Volume: (cm) 1.806 % Reduction in Area: %  Reduction in Volume: Epithelialization: None Tunneling: No Undermining: No Wound Description Classification: Category/Stage III Exudate Amount: Medium Exudate Type: Serosanguineous Exudate Color: red, brown Foul Odor After Cleansing: No Slough/Fibrino Yes Wound Bed Granulation Amount: Small (1-33%) Exposed Structure Granulation Quality: Pink Fascia Exposed: No Necrotic Amount: Large (67-100%) Fat Layer (Subcutaneous Tissue) Exposed: Yes Necrotic Quality: Eschar, Adherent Slough Tendon Exposed: No Muscle Exposed: No Joint Exposed: No Bone Exposed: No Electronic Signature(s) Signed: 07/28/2019 4:34:33 PM By: Carlene Coria RN Entered By: Carlene Coria on 07/24/2019 10:21:25 -------------------------------------------------------------------------------- Wound Assessment Details Patient Name: Date of Service: Anthony Dunlap ID D. 07/24/2019 9:00 A M Medical Record Number: [id number] Patient Account Number: [id number] Date of Birth/Sex: Treating RN: 11/08/1933 (86 y.o. Male) Carlene Coria Primary Care Kymere Fullington: [name] Other Clinician: Referring Aja Bolander: Treating Nyheem Binette/Extender: Garfield Cornea in Treatment: 0 Wound Status Wound Number: 2 Primary Pressure Ulcer Etiology: Wound Location: Right Calcaneus Wound Status: Open Wounding Event: Gradually Appeared Comorbid Hypertension, Type II Diabetes, Osteomyelitis, Received Date Acquired: 06/06/2019 History: Radiation Weeks Of Treatment: 0 Clustered Wound: No Wound Measurements Length: (cm) 5 Width: (cm) 5.2 Depth: (cm) 0.1 Area: (cm) 20.42 Volume: (cm) 2.042 % Reduction in Area: % Reduction in Volume: Epithelialization: None Tunneling: No Undermining: No Wound  Description Classification: Unstageable/Unclassified Exudate Amount: Medium Exudate Type: Serosanguineous Exudate Color: red, brown Foul Odor After Cleansing: No Slough/Fibrino Yes Wound Bed Granulation Amount: Small (1-33%) Exposed Structure Granulation Quality: Red, Pink Fascia Exposed: No Necrotic Amount: Large (67-100%) Fat Layer (Subcutaneous Tissue) Exposed: Yes Necrotic Quality: Eschar, Adherent Slough Tendon Exposed: No Muscle Exposed: No Joint Exposed: No Bone Exposed: No Electronic Signature(s) Signed: 07/28/2019 4:34:33 PM By: Carlene Coria RN Entered By: Carlene Coria on 07/24/2019 10:22:51 -------------------------------------------------------------------------------- Wound Assessment Details Patient Name: Date of Service: Anthony Dunlap ID D. 07/24/2019 9:00 A M Medical Record Number: [id number] Patient Account Number: [id number] Date of Birth/Sex: Treating RN: 1934-02-14 (86 y.o. Male) Carlene Coria Primary Care Cayleen Benjamin: [name] Other Clinician: Referring Loanne Emery: Treating Quanika Solem/Extender: Garfield Cornea in Treatment: 0 Wound Status Wound Number: 3 Primary Diabetic Wound/Ulcer of the Lower Extremity Etiology: Wound Location: Right T Fifth oe Wound Status: Open Wounding Event: Gradually Appeared Comorbid Hypertension, Type II Diabetes, Osteomyelitis, Received Date Acquired: 06/06/2019 History: Radiation Weeks Of Treatment: 0 Clustered Wound: No Wound Measurements Length: (cm) 0.2 Width: (cm) 0.6 Depth: (cm) 0.1 Area: (cm) 0.094 Volume: (cm) 0.009 % Reduction in Area: % Reduction in Volume: Epithelialization: None Tunneling: No Undermining: No Wound Description Classification: Grade 2 Exudate Amount: Medium Exudate Type: Serosanguineous Exudate Color: red, brown Foul Odor After Cleansing: No Wound Bed Granulation Amount: Large (67-100%) Exposed Structure Granulation Quality: Red Fascia Exposed: No Necrotic  Amount: None Present (0%) Fat Layer (Subcutaneous Tissue) Exposed: Yes Tendon Exposed: No Muscle Exposed: No Joint Exposed: No Bone Exposed: No Electronic Signature(s) Signed: 07/28/2019 4:34:33 PM By: Carlene Coria RN Entered By: Carlene Coria on 07/24/2019 10:24:16 -------------------------------------------------------------------------------- Wound Assessment Details Patient Name: Date of Service: YOHANN, CURL ID D. 07/24/2019 9:00 A M Medical Record Number: [id number] Patient Account Number: [id number] Date of Birth/Sex: Treating RN: 09-23-1933 (86 y.o. Male) Carlene Coria Primary Care Shigeru Lampert: [name] Other Clinician: Referring Alexie Lanni: Treating Nour Scalise/Extender: Garfield Cornea in Treatment: 0 Wound Status Wound Number: 4 Primary Diabetic Wound/Ulcer of the Lower Extremity Etiology: Wound Location: Right, Lateral Foot Wound Status: Open Wounding Event: Gradually Appeared Comorbid Hypertension, Type II Diabetes, Osteomyelitis, Received Date Acquired: 06/06/2019 History: Radiation Weeks Of Treatment: 0  Clustered Wound: No Wound Measurements Length: (cm) 1.1 Width: (cm) 1.3 Depth: (cm) 0.4 Area: (cm) 1.123 Volume: (cm) 0.449 % Reduction in Area: % Reduction in Volume: Epithelialization: None Tunneling: No Undermining: No Wound Description Classification: Grade 2 Exudate Amount: Medium Exudate Type: Serosanguineous Exudate Color: red, brown Foul Odor After Cleansing: No Slough/Fibrino Yes Wound Bed Necrotic Amount: Large (67-100%) Exposed Structure Necrotic Quality: Adherent Slough Fascia Exposed: No Fat Layer (Subcutaneous Tissue) Exposed: Yes Tendon Exposed: No Muscle Exposed: No Joint Exposed: No Bone Exposed: No Electronic Signature(s) Signed: 07/28/2019 4:34:33 PM By: Carlene Coria RN Entered By: Carlene Coria on 07/24/2019 10:25:39 -------------------------------------------------------------------------------- Vitals  Details Patient Name: Date of Service: Anthony Dunlap ID D. 07/24/2019 9:00 A M Medical Record Number: [id number] Patient Account Number: [id number] Date of Birth/Sex: Treating RN: 1933/07/31 (86 y.o. Male) Carlene Coria Primary Care Archie Shea: [name] Other Clinician: Referring Pamella Samons: Treating Doryan Bahl/Extender: Garfield Cornea in Treatment: 0 Vital Signs Time Taken: 09:37 Temperature (F): 98.6 Height (in): 68 Pulse (bpm): 105 Source: Stated Respiratory Rate (breaths/min): 18 Weight (lbs): 172 Blood Pressure (mmHg): 170/93 Source: Stated Capillary Blood Glucose (mg/dl): 220 Body Mass Index (BMI): 26.1 Reference Range: 80 - 120 mg / dl Electronic Signature(s) Signed: 07/28/2019 4:34:33 PM By: Carlene Coria RN Entered By: Carlene Coria on 07/24/2019 09:39:17

## 2019-08-05 ENCOUNTER — Ambulatory Visit (INDEPENDENT_AMBULATORY_CARE_PROVIDER_SITE_OTHER)
Admission: RE | Admit: 2019-08-05 | Discharge: 2019-08-05 | Disposition: A | Discharge status: Home / Self Care | Payer: Medicare Other | Source: Ambulatory Visit | Attending: Vascular Surgery | Admitting: Vascular Surgery

## 2019-08-05 ENCOUNTER — Ambulatory Visit (HOSPITAL_COMMUNITY)
Admission: RE | Admit: 2019-08-05 | Discharge: 2019-08-05 | Disposition: A | Discharge status: Home / Self Care | Payer: Medicare Other | Source: Ambulatory Visit | Attending: Vascular Surgery | Admitting: Vascular Surgery

## 2019-08-05 ENCOUNTER — Other Ambulatory Visit: Payer: Self-pay

## 2019-08-05 ENCOUNTER — Ambulatory Visit (HOSPITAL_COMMUNITY)
Admission: RE | Admit: 2019-08-05 | Discharge: 2019-08-05 | Disposition: A | Discharge status: Home / Self Care | Payer: Medicare Other | Source: Ambulatory Visit | Attending: Internal Medicine | Admitting: Internal Medicine

## 2019-08-05 ENCOUNTER — Other Ambulatory Visit (HOSPITAL_COMMUNITY): Payer: Self-pay | Admitting: Internal Medicine

## 2019-08-05 DIAGNOSIS — M869 Osteomyelitis, unspecified: Secondary | ICD-10-CM | POA: Insufficient documentation

## 2019-08-05 DIAGNOSIS — L97509 Non-pressure chronic ulcer of other part of unspecified foot with unspecified severity: Secondary | ICD-10-CM | POA: Insufficient documentation

## 2019-08-05 DIAGNOSIS — E11621 Type 2 diabetes mellitus with foot ulcer: Secondary | ICD-10-CM | POA: Insufficient documentation

## 2019-08-05 DIAGNOSIS — L97512 Non-pressure chronic ulcer of other part of right foot with fat layer exposed: Secondary | ICD-10-CM

## 2019-08-05 DIAGNOSIS — E1169 Type 2 diabetes mellitus with other specified complication: Secondary | ICD-10-CM | POA: Insufficient documentation

## 2019-08-06 ENCOUNTER — Encounter: Payer: Self-pay | Admitting: Orthopaedic Surgery

## 2019-08-06 ENCOUNTER — Ambulatory Visit (INDEPENDENT_AMBULATORY_CARE_PROVIDER_SITE_OTHER): Payer: Medicare Other | Admitting: Orthopaedic Surgery

## 2019-08-06 DIAGNOSIS — Z96641 Presence of right artificial hip joint: Secondary | ICD-10-CM | POA: Insufficient documentation

## 2019-08-06 NOTE — Progress Notes (Signed)
Post-Op Visit Note   Patient: Anthony Dunlap           Date of Birth: [birth year]           MRN: [id number] Visit Date: 08/06/2019 PCP: [name], MD   Assessment & Plan: Post total hip arthroplasty on the right.  Hip is doing well.  Lives in the skilled facility he developed heel necrosis with blood blisters he has some yellow necrotic skin and is getting dressing changes and enzymatic debridement with Santyl.  Arterial study showed he is got one-vessel blockage on both right and left leg below the knee but the other 2 are open.  He is being followed in the wound care clinic for his heels and can return to see me on an as-needed basis.  I discussed with him that if the heel ulcers do not continue to improve we could always have him see Dr. Sharol Given for surgical debridement and VAC placement.  Currently it looks like she is getting some gradual healing and may not need the [facility] currently  Chief Complaint:  Chief Complaint  Patient presents with  . Right Hip - Follow-up    06/12/2019 Right THA   Visit Diagnoses:  1. Hx of total hip arthroplasty, right     Plan: as above  Follow-Up Instructions: No follow-ups on file.   Orders:  No orders of the defined types were placed in this encounter.  No orders of the defined types were placed in this encounter.   Imaging: DG Foot Complete Left  Result Date: 08/06/2019 CLINICAL DATA:  Diabetic foot ulcers EXAM: LEFT FOOT - COMPLETE 3+ VIEW COMPARISON:  None. FINDINGS: Soft tissue ulcer is noted posteriorly along the heel. Calcaneal spurring is noted. No definitive erosive changes to suggest osteomyelitis are seen. No other focal abnormality is noted. IMPRESSION: Soft tissue ulcer posteriorly along the calcaneus although no bony erosive changes are seen. Electronically Signed   By: Inez Catalina M.D.   On: 08/06/2019 08:25   DG Foot Complete Right  Result Date: 08/06/2019 CLINICAL DATA:  Diabetic foot ulcers EXAM: RIGHT FOOT COMPLETE - 3+  VIEW COMPARISON:  None. FINDINGS: Soft tissue ulcer is noted posteriorly along the heel. Mild calcifications are noted along the Achilles tendon insertion. Tarsal degenerative changes are seen. No acute fracture or dislocation is noted. IMPRESSION: Soft tissue ulcer without bony destruction. Electronically Signed   By: Inez Catalina M.D.   On: 08/06/2019 08:27   ABI WITH/WO TBI  Result Date: 08/05/2019 LOWER EXTREMITY DOPPLER STUDY Indications: Claudication, ulceration, and peripheral artery disease. High Risk Factors: Hyperlipidemia, Diabetes, no history of smoking.  Performing Technologist: Delorise Shiner RVT  Examination Guidelines: A complete evaluation includes at minimum, Doppler waveform signals and systolic blood pressure reading at the level of bilateral brachial, anterior tibial, and posterior tibial arteries, when vessel segments are accessible. Bilateral testing is considered an integral part of a complete examination. Photoelectric Plethysmograph (PPG) waveforms and toe systolic pressure readings are included as required and additional duplex testing as needed. Limited examinations for reoccurring indications may be performed as noted.  ABI Findings: +---------+------------------+-----+----------+--------+ Right    Rt Pressure (mmHg)IndexWaveform  Comment  +---------+------------------+-----+----------+--------+ Brachial 157                                       +---------+------------------+-----+----------+--------+ ATA      99  0.63                    +---------+------------------+-----+----------+--------+ PTA      123               0.78 triphasic          +---------+------------------+-----+----------+--------+ DP                              monophasic         +---------+------------------+-----+----------+--------+ Great Toe0                 0.00                    +---------+------------------+-----+----------+--------+  +---------+------------------+-----+----------+-------+ Left     Lt Pressure (mmHg)IndexWaveform  Comment +---------+------------------+-----+----------+-------+ Brachial 153                                      +---------+------------------+-----+----------+-------+ ATA      152               0.97 triphasic         +---------+------------------+-----+----------+-------+ PTA      73                0.46 monophasic        +---------+------------------+-----+----------+-------+ Great Toe85                0.54                   +---------+------------------+-----+----------+-------+ +-------+-----------+-----------+------------+------------+ ABI/TBIToday's ABIToday's TBIPrevious ABIPrevious TBI +-------+-----------+-----------+------------+------------+ Right  0.78       0.00                                +-------+-----------+-----------+------------+------------+ Left   0.97       0.54                                +-------+-----------+-----------+------------+------------+  Summary: Right: Resting right ankle-brachial index indicates moderate right lower extremity arterial disease. The right toe-brachial index is abnormal. RT great toe pressure = 0 mmHg. Left: Resting left ankle-brachial index is within normal range. No evidence of significant left lower extremity arterial disease. The left toe-brachial index is abnormal. LT Great toe pressure = 85 mmHg.  *See table(s) above for measurements and observations.  Electronically signed by Deitra Mayo MD on 08/05/2019 at 3:11:42 PM.   Final    VAS [place] LOWER EXTREMITY ARTERIAL DUPLEX  Result Date: 08/05/2019 LOWER EXTREMITY ARTERIAL DUPLEX STUDY Indications: Claudication, and ulceration. High Risk Factors: Diabetes, no history of smoking.  Current ABI: Right: 0.78 Left :0.97 Performing Technologist: Delorise Shiner RVT  Examination Guidelines: A complete evaluation includes B-mode imaging, spectral Doppler, color  Doppler, and power Doppler as needed of all accessible portions of each vessel. Bilateral testing is considered an integral part of a complete examination. Limited examinations for reoccurring indications may be performed as noted.  +----------+--------+-----+--------+----------+---------+ RIGHT     PSV cm/sRatioStenosisWaveform  Comments  +----------+--------+-----+--------+----------+---------+ CFA Distal78                   triphasic           +----------+--------+-----+--------+----------+---------+ DFA  106                  triphasic           +----------+--------+-----+--------+----------+---------+ SFA Prox  100                  triphasic           +----------+--------+-----+--------+----------+---------+ SFA Mid   84                   triphasic           +----------+--------+-----+--------+----------+---------+ SFA Distal64                   triphasic           +----------+--------+-----+--------+----------+---------+ POP Prox  147                  triphasic           +----------+--------+-----+--------+----------+---------+ ATA Mid   0            occludedabsent              +----------+--------+-----+--------+----------+---------+ ATA Distal37                   monophasic          +----------+--------+-----+--------+----------+---------+ PTA Distal105                  biphasic  Hyperemic +----------+--------+-----+--------+----------+---------+ A focal velocity elevation of 0 cm/s was obtained at Mid calf anterior tibial artery. Findings are characteristic of occluded.  +----------+--------+-----+--------+---------+---------+ LEFT      PSV cm/sRatioStenosisWaveform Comments  +----------+--------+-----+--------+---------+---------+ CFA Distal83                   triphasic          +----------+--------+-----+--------+---------+---------+ DFA       57                   triphasic           +----------+--------+-----+--------+---------+---------+ SFA Prox  69                   triphasic          +----------+--------+-----+--------+---------+---------+ SFA Mid   76                   triphasic          +----------+--------+-----+--------+---------+---------+ SFA Distal81                   triphasic          +----------+--------+-----+--------+---------+---------+ POP Prox  124                  triphasic          +----------+--------+-----+--------+---------+---------+ POP Distal75                   triphasic          +----------+--------+-----+--------+---------+---------+ TP Trunk  81                   triphasic          +----------+--------+-----+--------+---------+---------+ ATA Distal100                  triphasicHyperemic +----------+--------+-----+--------+---------+---------+ PTA Distal0            occludedabsent             +----------+--------+-----+--------+---------+---------+ A focal velocity elevation of 0 cm/s was  obtained at Distal posterior tibial. Findings are characteristic of total occlusion.  Summary: Right: Total occlusion noted in the anterior tibial artery. Left: Total occlusion noted in the posterior tibial artery.  See table(s) above for measurements and observations. Electronically signed by Deitra Mayo MD on 08/05/2019 at 3:11:54 PM.    Final     PMFS History: Patient Active Problem List   Diagnosis Date Noted  . Hx of total hip arthroplasty, right 08/06/2019  . Arthritis of right hip 06/12/2019  . Pelvic abscess in male [facility]) 09/11/2018  . Drug-induced hepatitis 06/05/2018  . Calculus of gallbladder without cholecystitis without obstruction   . Fungemia/Candida albicans 05/10/2018  . UTI (urinary tract infection) 05/08/2018  . Complicated UTI (urinary tract infection) 05/08/2018  . 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 Abscess of left thigh   . Pressure injury  of skin 02/10/2018  . Enterococcus faecalis infection 01/27/2018  . Abscess   . Psoas abscess ([place]) 01/23/2018  . Sepsis due to Bacteroides species ([place]) 01/22/2018  . Sepsis without acute organ dysfunction ([place])   . DKA (diabetic ketoacidoses) ([place]) 01/17/2018  . Normocytic anemia 01/17/2018  . Chronic anemia 01/03/2018  . Chronic indwelling Foley catheter 01/03/2018  . Elevated alkaline phosphatase level 01/03/2018  . Hypothyroidism 01/03/2018  . Physical deconditioning 01/03/2018  . 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 HLD (hyperlipidemia) 08/19/2013  . Type 2 diabetes mellitus ([place])   . Prostate cancer [facility])    Past Medical History:  Diagnosis Date  . 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 Diabetes ([place])   . Drug-induced hepatitis 06/05/2018  . Inguinal hernia    06/09/2019: per patient " a long time ago on the right side"  . Left eye pain   . Localized osteoarthrosis of right hip 01/05/2019  . Prostate cancer [facility])     Family History  Problem Relation Age of Onset  . Pneumonia Father   . Cancer Sister     Past Surgical History:  Procedure Laterality Date  . HERNIA REPAIR    . IR RADIOLOGIST EVAL & MGMT  03/06/2018  . PROSTATE SURGERY    . TOTAL HIP ARTHROPLASTY Right 06/12/2019   Procedure: RIGHT TOTAL HIP ARTHROPLASTY DIRECT ANTERIOR;  Surgeon: Marybelle Killings, MD;  Location: [place];  Service: Orthopedics;  Laterality: Right;   Social History   Occupational History    Employer: RETIRED    Comment: Retired  Tobacco Use  . Smoking status: Never Smoker  . Smokeless tobacco: Never Used  Vaping Use  . Vaping Use: Never used  Substance and Sexual Activity  . Alcohol use: No  . Drug use: No  . Sexual activity: Not on file

## 2019-08-07 ENCOUNTER — Other Ambulatory Visit: Payer: Self-pay

## 2019-08-07 ENCOUNTER — Encounter (HOSPITAL_BASED_OUTPATIENT_CLINIC_OR_DEPARTMENT_OTHER): Payer: Medicare Other | Admitting: Internal Medicine

## 2019-08-07 DIAGNOSIS — E11621 Type 2 diabetes mellitus with foot ulcer: Secondary | ICD-10-CM | POA: Diagnosis not present

## 2019-08-07 LAB — GLUCOSE, CAPILLARY: Glucose-Capillary: 95 mg/dL (ref 70–99)

## 2019-08-07 NOTE — Progress Notes (Signed)
VERSHAWN WESTRUP ([id number]) , Visit Report for 08/07/2019 HPI Details Patient Name: Date of Service: Anthony Dunlap, Anthony D. 08/07/2019 10:45 A M Medical Record Number: [id number] Patient Account Number: [id number] Date of Birth/Sex: Treating RN: 12-11-1933 (86 y.o. Anthony Dunlap Primary Care Provider: [name] Other Clinician: Referring Provider: Treating Provider/Extender: Garfield Cornea in Treatment: 2 History of Present Illness HPI Description: ADMISSION 07/24/2019 This is an 86 year old man who has a complicated past medical history however he is a type II diabetic. He recently had a Dunlap total hip replacement was hospitalized from 4/23 through 4/27. He was discharged to [facility] in [place] and now has returned to his home in [place]. He comes in today with his caregiver. Apparently he had the open wound on the Dunlap lateral fifth metatarsal head even before he had the hip surgery but did not have any other wounds. He has returned home with 2 large probable pressure areas in both heels. They have home health they have been using Medihoney however it has been recently suggested the use Santyl prescribed by Dr. Gerarda Fraction The patient has an unstageable wound on the Dunlap calcaneus and a least a stage III pressure ulcer on the left. The area on the Dunlap fifth metatarsal head also has a necrotic surface and he has a small slitlike area just above the Dunlap fifth metatarsal head which seems more superficial. He does not have a known history of PAD. 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 He also had an abscess on his thigh with wound culture showing Enterococcus and Klebsiella. He required admission from 05/08/18 through 05/14/2018 with Candida albicans  sepsis which was treated. He was readmitted again from 08/22/2018 through 09/14/2018 with again pubic symphysis abscess. He is now on chronic Augmentin because of this and followed by [name] ABIs in our clinic were 0.71 in the Dunlap and 0.78 on the left 08/07/19; The patient's bilateral foot x-rays were negative without significant bony destruction. Arterial evaluation showed an ABI on the Dunlap at 0.78 there was no flow to the great toe. Triphasic waveforms at the PTA and monophasic at the dorsalis pedis Doppler flows showed triphasic waveforms in the thigh at the CFA, DFA, SFA popliteal. The ATA was occluded biphasic at the PTA. On the left ABI was better at 0.97. TBI of 0.54. Triphasic waveforms at the ATA monophasic at the PTA she did have triphasic waveforms at the left at the popliteal, tibial peroneal trunk and ATA but the PTA was occluded or absent. The patient is definitely going to need to see vascular surgery. We have continued with Santyl to the wounds the patient has which includes 2 on the lateral part of the Dunlap fifth toe, lateral part of the Dunlap fifth met head. Large pressure ulcers over both heels. Electronic Signature(s) Signed: 08/07/2019 5:24:49 PM By: Linton Ham MD Entered By: Linton Ham on 08/07/2019 12:27:28 -------------------------------------------------------------------------------- Physical Exam Details Patient Name: Date of Service: Anthony Dunlap ID D. 08/07/2019 10:45 A M Medical Record Number: [id number] Patient Account Number: [id number] Date of Birth/Sex: Treating RN: Jul 04, 1933 (86 y.o. Anthony Dunlap Primary Care Provider: [name] Other Clinician: Referring Provider: Treating Provider/Extender: Garfield Cornea in Treatment: 2 Constitutional Patient is hypertensive.. Pulse regular and within target range for patient.[name] Respirations regular, non-labored and  within target range.. Temperature is normal  and within the target range for the patient.[name] Appears in no distress. Cardiovascular Pedal pulses absent I can. Popliteal pulses. Notes Wound exam the area on the fifth metatarsal head x2. Necrotic. Nonviable surface. Very little tissue remains over bone Large necrotic surface over the Dunlap heel and left heel ulcers are going to require debridement although I want to see what his best vascular status will be before I attempt this Electronic Signature(s) Signed: 08/07/2019 5:24:49 PM By: Baltazar Najjar MD Entered By: Baltazar Najjar on 08/07/2019 12:32:28 -------------------------------------------------------------------------------- Physician Orders Details Patient Name: Date of Service: Anthony Dunlap ID D. 08/07/2019 10:45 A M Medical Record Number: [id number] Patient Account Number: [id number] Date of Birth/Sex: Treating RN: 1933/03/03 (86 y.o. 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 Dressing Change Frequency Wound #1 Left Calcaneus Change dressing every day. Wound #2 Dunlap Calcaneus Change dressing every day. Wound #3 Dunlap T Fifth oe Change dressing every day. Wound #4 Dunlap,Lateral Foot Change dressing every day. Wound Cleansing Wound #1 Left Calcaneus Clean wound with Normal Saline. - or wound cleanser Wound #2 Dunlap  Calcaneus Clean wound with Normal Saline. - or wound cleanser Wound #3 Dunlap T Fifth oe Clean wound with Normal Saline. - or wound cleanser Wound #4 Dunlap,Lateral Foot Clean wound with Normal Saline. - or wound cleanser Primary Wound Dressing Wound #1 Left Calcaneus Santyl Ointment - apply lightly moistened saline gauze over santyl Wound #2 Dunlap Calcaneus Santyl Ointment - apply lightly moistened saline gauze over santyl Wound #3 Dunlap T Fifth oe Santyl Ointment - apply lightly moistened saline gauze over santyl Wound #4 Dunlap,Lateral Foot Santyl Ointment - apply lightly moistened saline gauze over santyl Secondary Dressing Wound #1 Left Calcaneus Kerlix/Rolled Gauze Dry Gauze Heel Cup Wound #2 Dunlap Calcaneus Kerlix/Rolled Gauze Dry Gauze Heel Cup Wound #3 Dunlap T Fifth oe Kerlix/Rolled Gauze Dry Gauze Wound #4 Dunlap,Lateral Foot Kerlix/Rolled Gauze Dry Gauze Off-Loading Other: - offloading heel sandal to both feet Home Health Continue Home Health skilled nursing for wound care. - [facility] Consults Vascular - ASAP: Consult for Vascular Surgeon. Wounds to Bilateral Feet and Heels and post-studies. Vein and Vascular - (ICD10 E11.42 - Type 2 diabetes mellitus with diabetic polyneuropathy) Electronic Signature(s) Signed: 08/07/2019 5:07:15 PM By: Cherylin Mylar Signed: 08/07/2019 5:24:49 PM By: Baltazar Najjar MD Entered By: Cherylin Mylar on 08/07/2019 11:36:24 Prescription 08/07/2019 Nolting Jordie D. -------------------------------------------------------------------------------- , Baltazar Najjar MD Patient Name: Provider: 26-Sep-1933 [id number] Date of Birth: NPI#: Judie Petit [id number] Sex: DEA #: [phone] [id number] Phone #: License #: Eligha Bridegroom [facility] Patient Address: [address] [place], [place] [address] [address] [place], [place] [address] [phone] Allergies Name Reaction  Severity fluconazole Provider's Orders Vascular - ICD10: E11.42 - ASAP: Consult for Vascular Surgeon. Wounds to Bilateral Feet and Heels and post-studies. Vein and Vascular Hand Signature: Date(s): Electronic Signature(s) Signed: 08/07/2019 5:07:15 PM By: Cherylin Mylar Signed: 08/07/2019 5:24:49 PM By:  Linton Ham MD Entered By: Kela Millin on 08/07/2019 11:36:25 -------------------------------------------------------------------------------- Problem List Details Patient Name: Date of Service: Anthony Dunlap, Anthony ID D. 08/07/2019 10:45 A M Medical Record Number: [id number] Patient Account Number: [id number] Date of Birth/Sex: Treating RN: 28-Jun-1933 (86 y.o. 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 08/07/2019 10:45 A M Medical Record Number: [id number] Patient Account Number: [id number] Date of Birth/Sex: Treating RN: 08/06/1933 (86 y.o. Anthony Dunlap Primary Care  Provider: [name] Other Clinician: Referring Provider: Treating Provider/Extender: Garfield Cornea in Treatment: 2 Subjective History of Present Illness (HPI) ADMISSION 07/24/2019 This is an 86 year old man who has a complicated past medical history however he is a type II diabetic. He recently had a Dunlap total hip replacement was hospitalized from 4/23 through 4/27. He was discharged to [facility] in [place] and now has returned to his home in [place]. He comes in today with his caregiver. Apparently he had the open wound on the Dunlap lateral fifth metatarsal head even before he had the hip surgery but did not have any other wounds. He has returned home with 2 large probable pressure areas in both heels. They have home health they have been using Medihoney however it has been recently suggested the use Santyl prescribed by Dr. Gerarda Fraction The patient has an unstageable wound on the Dunlap calcaneus and a least a stage III pressure ulcer on the left. The area on the Dunlap fifth metatarsal head also has a necrotic surface and he has a small slitlike area just above the Dunlap fifth metatarsal head which seems more superficial. He does not have a known history of PAD. 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 He also had an abscess on his thigh with wound culture showing Enterococcus and Klebsiella. He required admission from 05/08/18 through 05/14/2018 with Candida albicans sepsis which was treated. He was readmitted again from 08/22/2018 through 09/14/2018 with again pubic symphysis abscess. He is now on chronic Augmentin because of this and followed by [name] ABIs in our clinic were 0.71 in the Dunlap and 0.78 on the left 08/07/19; The patient's bilateral foot  x-rays were negative without significant bony destruction. Arterial evaluation showed an ABI on the Dunlap at 0.78 there was no flow to the great toe. Triphasic waveforms at the PTA and monophasic at the dorsalis pedis Doppler flows showed triphasic waveforms in the thigh at the CFA, DFA, SFA popliteal. The ATA was occluded biphasic at the PTA. On the left ABI was better at 0.97. TBI of 0.54. Triphasic waveforms at the  ATA monophasic at the PTA she did have triphasic waveforms at the left at the popliteal, tibial peroneal trunk and ATA but the PTA was occluded or absent. The patient is definitely going to need to see vascular surgery. We have continued with Santyl to the wounds the patient has which includes 2 on the lateral part of the Dunlap fifth toe, lateral part of the Dunlap fifth met head. Large pressure ulcers over both heels. Objective Constitutional Patient is hypertensive.. Pulse regular and within target range for patient.[name] Respirations regular, non-labored and within target range.. Temperature is normal and within the target range for the patient.[name] Appears in no distress. Vitals Time Taken: 10:48 AM, Height: 68 in, Source: Stated, Weight: 172 lbs, Source: Stated, BMI: 26.1, Temperature: 98.4 F, Pulse: 101 bpm, Respiratory Rate: 18 breaths/min, Blood Pressure: 150/83 mmHg, Capillary Blood Glucose: 109 mg/dl. General Notes: glucose per pt report this morning Cardiovascular Pedal pulses absent I can. Popliteal pulses. General Notes: Wound exam the area on the fifth metatarsal head x2. Necrotic. Nonviable surface. Very little tissue remains over bone ooLarge necrotic surface over the Dunlap heel and left heel ulcers are going to require debridement although I want to see what his best vascular status will be before I attempt this Integumentary (Hair, Skin) Wound #1 status is Open. Original cause of wound was Gradually Appeared. The wound is located on the Left Calcaneus. The wound  measures 5.4cm length x 3.5cm width x 0.5cm depth; 14.844cm^2 area and 7.422cm^3 volume. There is Fat Layer (Subcutaneous Tissue) Exposed exposed. There is no tunneling or undermining noted. There is a large amount of serosanguineous drainage noted. The wound margin is distinct with the outline attached to the wound base. There is no granulation within the wound bed. There is a large (67-100%) amount of necrotic tissue within the wound bed including Eschar and Adherent Slough. Wound #2 status is Open. Original cause of wound was Gradually Appeared. The wound is located on the Dunlap Calcaneus. The wound measures 5.3cm length x 5.1cm width x 0.6cm depth; 21.229cm^2 area and 12.738cm^3 volume. There is Fat Layer (Subcutaneous Tissue) Exposed exposed. There is no tunneling or undermining noted. There is a large amount of purulent drainage noted. The wound margin is distinct with the outline attached to the wound base. There is no granulation within the wound bed. There is a large (67-100%) amount of necrotic tissue within the wound bed including Eschar and Adherent Slough. Wound #3 status is Open. Original cause of wound was Gradually Appeared. The wound is located on the Dunlap T Fifth. The wound measures 0.2cm length x oe 0.5cm width x 0.1cm depth; 0.079cm^2 area and 0.008cm^3 volume. There is no tunneling or undermining noted. There is a small amount of serosanguineous drainage noted. The wound margin is flat and intact. There is no granulation within the wound bed. There is a large (67-100%) amount of necrotic tissue within the wound bed including Adherent Slough. Wound #4 status is Open. Original cause of wound was Gradually Appeared. The wound is located on the Dunlap,Lateral Foot. The wound measures 1cm length x 1.1cm width x 0.3cm depth; 0.864cm^2 area and 0.259cm^3 volume. There is Fat Layer (Subcutaneous Tissue) Exposed exposed. There is no tunneling or undermining noted. There is a medium amount  of serosanguineous drainage noted. The wound margin is distinct with the outline attached to the wound base. There is no granulation within the wound bed. There is a large (67-100%) amount of necrotic tissue within the wound bed including Adherent  Slough. 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 Dressing Change Frequency: Wound #1 Left Calcaneus: Change dressing every day. Wound #2 Dunlap Calcaneus: Change dressing every day. Wound #3 Dunlap T Fifth: oe Change dressing every day. Wound #4 Dunlap,Lateral Foot: Change dressing every day. Wound Cleansing: Wound #1 Left Calcaneus: Clean wound with Normal Saline. - or wound cleanser Wound #2 Dunlap Calcaneus: Clean wound with Normal Saline. - or wound cleanser Wound #3 Dunlap T Fifth: oe Clean wound with Normal Saline. - or wound cleanser Wound #4 Dunlap,Lateral Foot: Clean wound with Normal Saline. - or wound cleanser Primary Wound Dressing: Wound #1 Left Calcaneus: Santyl Ointment - apply lightly moistened saline gauze over santyl Wound #2 Dunlap Calcaneus: Santyl Ointment - apply lightly moistened saline gauze over santyl Wound #3 Dunlap T Fifth: oe Santyl Ointment - apply lightly moistened saline gauze over santyl Wound #4 Dunlap,Lateral Foot: Santyl Ointment - apply lightly moistened saline gauze over santyl Secondary Dressing: Wound #1 Left Calcaneus: Kerlix/Rolled Gauze Dry Gauze Heel Cup Wound #2 Dunlap Calcaneus: Kerlix/Rolled Gauze Dry Gauze Heel Cup Wound #3 Dunlap T Fifth: oe Kerlix/Rolled Gauze Dry Gauze Wound #4 Dunlap,Lateral  Foot: Kerlix/Rolled Gauze Dry Gauze Off-Loading: Other: - offloading heel sandal to both feet Home Health: [facility] skilled nursing for wound care. - [facility] Consults ordered were: Vascular - ASAP: Consult for Vascular Surgeon. Wounds to Bilateral Feet and Heels and post-studies. Vein and Vascular #1 continue Santyl to all wound areas 2. Vascular surgery referral ASAP. I suspect he will need an angiogram Electronic Signature(s) Signed: 08/07/2019 5:24:49 PM By: Linton Ham MD Entered By: Linton Ham on 08/07/2019 12:33:34 -------------------------------------------------------------------------------- SuperBill Details Patient Name: Date of Service: Anthony Dunlap ID D. 08/07/2019 Medical Record Number: [id number] Patient Account Number: [id number] Date of Birth/Sex: Treating RN: December 10, 1933 (86 y.o. 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 with other specified severity E11.42 Type 2 diabetes mellitus with diabetic polyneuropathy Facility Procedures CPT4 Code: [id number] Description: 18299 - WOUND CARE VISIT-LEV 5 EST PT Modifier: Quantity: 1 Physician Procedures : CPT4 Code Description Modifier [id number] - WC PHYS LEVEL 3 - EST PT ICD-10 Diagnosis Description L89.610 Pressure ulcer of Dunlap heel, unstageable E11.621 Type 2 diabetes mellitus with foot ulcer L89.623 Pressure ulcer of left heel, stage 3 E11.51  Type 2 diabetes mellitus with diabetic peripheral angiopathy without  gangrene Quantity: 1 Electronic Signature(s) Signed: 08/07/2019 5:24:49 PM By: Linton Ham MD Entered By: Linton Ham on 08/07/2019 12:34:04

## 2019-08-07 NOTE — Progress Notes (Addendum)
KENTARO ALEWINE ([id number]) , Visit Report for 08/07/2019 Arrival Information Details Patient Name: Date of Service: DMONTE, MAHER D. 08/07/2019 10:45 A M Medical Record Number: [id number] Patient Account Number: [id number] Date of Birth/Sex: Treating RN: 22-Feb-1933 (86 y.o. Ernestene Mention Primary Care Retia Cordle: [name] Other Clinician: Referring Dawaun Brancato: Treating Kysen Wetherington/Extender: Garfield Cornea in Treatment: 2 Visit Information History Since Last Visit Added or deleted any medications: Yes Patient Arrived: Walker Any new allergies or adverse reactions: No Arrival Time: 10:43 Had a fall or experienced change in No Accompanied By: son activities of daily living that may affect Transfer Assistance: None risk of falls: Patient Identification Verified: Yes Signs or symptoms of abuse/neglect since last visito No Secondary Verification Process Completed: Yes Hospitalized since last visit: No Patient Has Alerts: No Implantable device outside of the clinic excluding No cellular tissue based products placed in the center since last visit: Has Dressing in Place as Prescribed: Yes Has Footwear/Offloading in Place as Prescribed: Yes Left: Other:heel offloading shoes Right: Other:heel offloading shoes Pain Present Now: No Electronic Signature(s) Signed: 08/07/2019 4:42:48 PM By: Baruch Gouty RN, BSN Entered By: Baruch Gouty on 08/07/2019 10:48:41 -------------------------------------------------------------------------------- Clinic Level of Care Assessment Details Patient Name: Date of Service: [name] 08/07/2019 10:45 A M Medical Record Number: [id number] Patient Account Number: [id number] Date of Birth/Sex: Treating RN: 17-Aug-1933 (86 y.o. [name] Primary Care Patrisha Hausmann: [name] Other Clinician: Referring Jessup Ogas: Treating Giang Hemme/Extender: Garfield Cornea in Treatment: 2 Clinic  Level of Care Assessment Items TOOL 4 Quantity Score X- 1 0 Use when only an EandM is performed on FOLLOW-UP visit ASSESSMENTS - Nursing Assessment / Reassessment X- 1 10 Reassessment of Co-morbidities (includes updates in patient status) X- 1 5 Reassessment of Adherence to Treatment Plan ASSESSMENTS - Wound and Skin A ssessment / Reassessment '[]'$  - 0 Simple Wound Assessment / Reassessment - one wound X- 4 5 Complex Wound Assessment / Reassessment - multiple wounds '[]'$  - 0 Dermatologic / Skin Assessment (not related to wound area) ASSESSMENTS - Focused Assessment X- 2 5 Circumferential Edema Measurements - multi extremities '[]'$  - 0 Nutritional Assessment / Counseling / Intervention '[]'$  - 0 Lower Extremity Assessment (monofilament, tuning fork, pulses) '[]'$  - 0 Peripheral Arterial Disease Assessment (using hand held doppler) ASSESSMENTS - Ostomy and/or Continence Assessment and Care '[]'$  - 0 Incontinence Assessment and Management '[]'$  - 0 Ostomy Care Assessment and Management (repouching, etc.) PROCESS - Coordination of Care X - Simple Patient / Family Education for ongoing care 1 15 '[]'$  - 0 Complex (extensive) Patient / Family Education for ongoing care X- 1 10 Staff obtains Programmer, systems, Records, T Results / Process Orders est X- 1 10 Staff telephones HHA, Nursing Homes / Clarify orders / etc '[]'$  - 0 Routine Transfer to another Facility (non-emergent condition) '[]'$  - 0 Routine Hospital Admission (non-emergent condition) '[]'$  - 0 New Admissions / Biomedical engineer / Ordering NPWT Apligraf, etc. , '[]'$  - 0 Emergency Hospital Admission (emergent condition) X- 1 10 Simple Discharge Coordination '[]'$  - 0 Complex (extensive) Discharge Coordination PROCESS - Special Needs '[]'$  - 0 Pediatric / Minor Patient Management '[]'$  - 0 Isolation Patient Management '[]'$  - 0 Hearing / Language / Visual special needs '[]'$  - 0 Assessment of Community assistance (transportation, D/C planning,  etc.) '[]'$  - 0 Additional assistance / Altered mentation '[]'$  - 0 Support Surface(s) Assessment (bed, cushion, seat, etc.) INTERVENTIONS - Wound Cleansing / Measurement '[]'$  - 0 Simple Wound  Cleansing - one wound X- 4 5 Complex Wound Cleansing - multiple wounds X- 1 5 Wound Imaging (photographs - any number of wounds) '[]'$  - 0 Wound Tracing (instead of photographs) '[]'$  - 0 Simple Wound Measurement - one wound X- 4 5 Complex Wound Measurement - multiple wounds INTERVENTIONS - Wound Dressings X - Small Wound Dressing one or multiple wounds 4 10 '[]'$  - 0 Medium Wound Dressing one or multiple wounds '[]'$  - 0 Large Wound Dressing one or multiple wounds X- 1 5 Application of Medications - topical '[]'$  - 0 Application of Medications - injection INTERVENTIONS - Miscellaneous '[]'$  - 0 External ear exam '[]'$  - 0 Specimen Collection (cultures, biopsies, blood, body fluids, etc.) '[]'$  - 0 Specimen(s) / Culture(s) sent or taken to Lab for analysis '[]'$  - 0 Patient Transfer (multiple staff / Civil Service fast streamer / Similar devices) '[]'$  - 0 Simple Staple / Suture removal (25 or less) '[]'$  - 0 Complex Staple / Suture removal (26 or more) '[]'$  - 0 Hypo / Hyperglycemic Management (close monitor of Blood Glucose) '[]'$  - 0 Ankle / Brachial Index (ABI) - do not check if billed separately X- 1 5 Vital Signs Has the patient been seen at the hospital within the last three years: Yes Total Score: 185 Level Of Care: New/Established - Level 5 Electronic Signature(s) Signed: 08/07/2019 5:07:15 PM By: Kela Millin Entered By: Kela Millin on 08/07/2019 11:35:09 -------------------------------------------------------------------------------- Encounter Discharge Information Details Patient Name: Date of Service: [name] 08/07/2019 10:45 A M Medical Record Number: [id number] Patient Account Number: [id number] Date of Birth/Sex: Treating RN: 1933-10-25 (86 y.o. Hessie Diener Primary Care Courtnie Brenes: [name] Other Clinician: Referring Darrien Laakso: Treating Nakenya Theall/Extender: Garfield Cornea in Treatment: 2 Encounter Discharge Information Items Discharge Condition: Stable Ambulatory Status: Walker Discharge Destination: Home Transportation: Private Auto Accompanied By: family member Schedule Follow-up Appointment: Yes Clinical Summary of Care: Electronic Signature(s) Signed: 08/07/2019 5:30:12 PM By: Deon Pilling Entered By: Deon Pilling on 08/07/2019 12:59:09 -------------------------------------------------------------------------------- Lower Extremity Assessment Details Patient Name: Date of Service: SHANTE, MAYSONET ID D. 08/07/2019 10:45 A M Medical Record Number: [id number] Patient Account Number: [id number] Date of Birth/Sex: Treating RN: 1933/04/04 (86 y.o. Ernestene Mention Primary Care Rudolpho Claxton: [name] Other Clinician: Referring Bayleigh Loflin: Treating Goldia Ligman/Extender: Garfield Cornea in Treatment: 2 Edema Assessment Assessed: Shirlyn Goltz: No] Patrice Paradise: No] Edema: [Left: No] [Right: No] Calf Left: Right: Point of Measurement: 42 cm From Medial Instep 31 cm 32 cm Ankle Left: Right: Point of Measurement: 10 cm From Medial Instep 21 cm 22 cm Vascular Assessment Pulses: Dorsalis Pedis Palpable: [Left:Yes] [Right:No] Electronic Signature(s) Signed: 08/07/2019 4:42:48 PM By: Baruch Gouty RN, BSN Entered By: Baruch Gouty on 08/07/2019 10:58:50 -------------------------------------------------------------------------------- Multi Wound Chart Details Patient Name: Date of Service: [name] 08/07/2019 10:45 A M Medical Record Number: [id number] Patient Account Number: [id number] Date of Birth/Sex: Treating RN: [birth year] (86 y.o. [name] Primary Care Deyton Ellenbecker: [name] Other Clinician: Referring Lanaiya Lantry: Treating Kacey Dysert/Extender: Garfield Cornea in Treatment:  2 Vital Signs Height(in): 68 Capillary Blood Glucose(mg/dl): 109 Weight(lbs): 172 Pulse(bpm): 101 Body Mass Index(BMI): 26 Blood Pressure(mmHg): 150/83 Temperature(F): 98.4 Respiratory Rate(breaths/min): 18 Photos: [1:No Photos Left Calcaneus] [2:No Photos Right Calcaneus] [3:No 46 Right T Fifth oe] Wound Location: [1:Gradually Appeared] [2:Gradually Appeared] [3:Gradually Appeared] Wounding Event: [1:Pressure Ulcer] [2:Pressure Ulcer] [3:Diabetic Wound/Ulcer of the Lower] Primary Etiology: [1:Hypertension, Type II Diabetes,] [2:Hypertension, Type II Diabetes,] [3:Extremity Hypertension, Type II Diabetes,] Comorbid  History: [1:Osteomyelitis, Received Radiation 06/06/2019] [2:Osteomyelitis, Received Radiation 06/06/2019] [3:Osteomyelitis, Received Radiation 06/06/2019] Date Acquired: [1:2] [2:2] [3:2] Weeks of Treatment: [1:Open] [2:Open] [3:Open] Wound Status: [1:5.4x3.5x0.5] [2:5.3x5.1x0.6] [3:0.2x0.5x0.1] Measurements L x W x D (cm) [1:14.844] [2:21.229] [3:0.079] A (cm) : rea [1:7.422] [2:12.738] [3:0.008] Volume (cm) : [1:17.80%] [2:-4.00%] [3:16.00%] % Reduction in A rea: [1:-311.00%] [2:-523.80%] [3:11.10%] % Reduction in Volume: [1:Category/Stage III] [2:Unstageable/Unclassified] [3:Grade 2] Classification: [1:Large] [2:Large] [3:Small] Exudate A mount: [1:Serosanguineous] [2:Purulent] [3:Serosanguineous] Exudate Type: [1:red, brown] [2:yellow, brown, green] [3:red, brown] Exudate Color: [1:Distinct, outline attached] [2:Distinct, outline attached] [3:Flat and Intact] Wound Margin: [1:None Present (0%)] [2:None Present (0%)] [3:None Present (0%)] Granulation A mount: [1:Large (67-100%)] [2:Large (67-100%)] [3:Large (67-100%)] Necrotic A mount: [1:Eschar, Adherent Slough] [2:Eschar, Adherent Slough] [3:Adherent Slough] Necrotic Tissue: [1:Fat Layer (Subcutaneous Tissue)] [2:Fat Layer (Subcutaneous Tissue)] [3:Fascia: No] Exposed Structures: [1:Exposed: Yes Fascia: No  Tendon: No Muscle: No Joint: No Bone: No None] [2:Exposed: Yes Fascia: No Tendon: No Muscle: No Joint: No Bone: No None] [3:Fat Layer (Subcutaneous Tissue) Exposed: No Tendon: No Muscle: No Joint: No Bone: No Small (1-33%)] Wound Number: 4 N/A N/A Photos: No Photos N/A N/A Right, Lateral Foot N/A N/A Wound Location: Gradually Appeared N/A N/A Wounding Event: Diabetic Wound/Ulcer of the Lower N/A N/A Primary Etiology: Extremity Hypertension, Type II Diabetes, N/A N/A Comorbid History: Osteomyelitis, Received Radiation 06/06/2019 N/A N/A Date Acquired: 2 N/A N/A Weeks of Treatment: Open N/A N/A Wound Status: 1x1.1x0.3 N/A N/A Measurements L x W x D (cm) 0.864 N/A N/A A (cm) : rea 0.259 N/A N/A Volume (cm) : 23.10% N/A N/A % Reduction in A rea: 42.30% N/A N/A % Reduction in Volume: Grade 2 N/A N/A Classification: Medium N/A N/A Exudate A mount: Serosanguineous N/A N/A Exudate Type: red, brown N/A N/A Exudate Color: Distinct, outline attached N/A N/A Wound Margin: None Present (0%) N/A N/A Granulation A mount: Large (67-100%) N/A N/A Necrotic A mount: Adherent Slough N/A N/A Necrotic Tissue: Fat Layer (Subcutaneous Tissue) N/A N/A Exposed Structures: Exposed: Yes Fascia: No Tendon: No Muscle: No Joint: No Bone: No None N/A N/A Epithelialization: Treatment Notes Electronic Signature(s) Signed: 08/07/2019 5:07:15 PM By: Kela Millin Signed: 08/07/2019 5:24:49 PM By: Linton Ham MD Entered By: Linton Ham on 08/07/2019 12:16:31 -------------------------------------------------------------------------------- Multi-Disciplinary Care Plan Details Patient Name: Date of Service: [name] 08/07/2019 10:45 A M Medical Record Number: [id number] Patient Account Number: [id number] Date of Birth/Sex: Treating RN: [birth year] (86 y.o. [name] Primary Care Zanae Kuehnle: [name] Other Clinician: Referring Ivyrose Hashman: Treating  Loxley Schmale/Extender: Garfield Cornea in Treatment: 2 Active Inactive Nutrition Nursing Diagnoses: Potential for alteratiion in Nutrition/Potential for imbalanced nutrition Goals: Patient/caregiver verbalizes understanding of need to maintain therapeutic glucose control per primary care physician Date Initiated: 07/24/2019 Target Resolution Date: 09/11/2019 Goal Status: Active Interventions: Provide education on nutrition Treatment Activities: Education provided on Nutrition : 07/24/2019 Notes: Pain, Acute or Chronic Nursing Diagnoses: Pain, acute or chronic: actual or potential Goals: Patient/caregiver will verbalize comfort level met Date Initiated: 07/24/2019 Target Resolution Date: 09/11/2019 Goal Status: Active Interventions: Provide education on pain management Notes: Pressure Nursing Diagnoses: Knowledge deficit related to management of pressures ulcers Goals: Patient/caregiver will verbalize risk factors for pressure ulcer development Date Initiated: 07/24/2019 Target Resolution Date: 09/11/2019 Goal Status: Active Interventions: Provide education on pressure ulcers Notes: Wound/Skin Impairment Nursing Diagnoses: Impaired tissue integrity Goals: Ulcer/skin breakdown will have a volume reduction of 50% by week 8 Date Initiated: 07/24/2019 Target Resolution Date: 09/11/2019 Goal Status: Active  Interventions: Provide education on ulcer and skin care Notes: Electronic Signature(s) Signed: 08/07/2019 5:07:15 PM By: Kela Millin Entered By: Kela Millin on 08/07/2019 10:36:14 -------------------------------------------------------------------------------- Pain Assessment Details Patient Name: Date of Service: JAXX, HUISH ID D. 08/07/2019 10:45 A M Medical Record Number: [id number] Patient Account Number: [id number] Date of Birth/Sex: Treating RN: Jul 28, 1933 (86 y.o. Ernestene Mention Primary Care Vinay Ertl: [name] Other  Clinician: Referring Willadene Mounsey: Treating Maloree Uplinger/Extender: Garfield Cornea in Treatment: 2 Active Problems Location of Pain Severity and Description of Pain Patient Has Paino No Site Locations Rate the pain. Rate the pain. Current Pain Level: 0 Pain Management and Medication Current Pain Management: Electronic Signature(s) Signed: 08/07/2019 4:42:48 PM By: Baruch Gouty RN, BSN Entered By: Baruch Gouty on 08/07/2019 10:51:34 -------------------------------------------------------------------------------- Patient/Caregiver Education Details Patient Name: Date of Service: Roselyn Bering D. 6/18/2021andnbsp10:45 [place] Record Number: [id number] Patient Account Number: [id number] Date of Birth/Gender: Treating RN: 03/01/1933 (87 y.o. [name] Primary Care Physician: [name] Other Clinician: Referring Physician: Treating Physician/Extender: Garfield Cornea in Treatment: 2 Education Assessment Education Provided To: Patient Education Topics Provided Nutrition: Handouts: Elevated Blood Sugars: How Do They Affect Wound Healing Methods: Explain/Verbal Responses: State content correctly Pain: Handouts: A Guide to Pain Control Methods: Explain/Verbal Responses: State content correctly Pressure: Handouts: Pressure Ulcers: Care and Offloading Methods: Explain/Verbal Responses: State content correctly Wound/Skin Impairment: Handouts: Caring for Your Ulcer Methods: Explain/Verbal Responses: State content correctly Electronic Signature(s) Signed: 08/07/2019 5:07:15 PM By: Kela Millin Entered By: Kela Millin on 08/07/2019 10:36:55 -------------------------------------------------------------------------------- Wound Assessment Details Patient Name: Date of Service: [name] 08/07/2019 10:45 A M Medical Record Number: [id number] Patient Account Number: [id number] Date of  Birth/Sex: Treating RN: [birth year] (86 y.o. Ernestene Mention Primary Care Jacqui Headen: [name] Other Clinician: Referring Yianni Skilling: Treating Carlise Stofer/Extender: Garfield Cornea in Treatment: 2 Wound Status Wound Number: 1 Primary Pressure Ulcer Etiology: Wound Location: Left Calcaneus Wound Status: Open Wounding Event: Gradually Appeared Comorbid Hypertension, Type II Diabetes, Osteomyelitis, Received Date Acquired: 06/06/2019 History: Radiation Weeks Of Treatment: 2 Clustered Wound: No Photos Wound Measurements Length: (cm) 5.4 Width: (cm) 3.5 Depth: (cm) 0.5 Area: (cm) 14.844 Volume: (cm) 7.422 % Reduction in Area: 17.8% % Reduction in Volume: -311% Epithelialization: None Tunneling: No Undermining: No Wound Description Classification: Category/Stage III Wound Margin: Distinct, outline attached Exudate Amount: Large Exudate Type: Serosanguineous Exudate Color: red, brown Foul Odor After Cleansing: No Slough/Fibrino Yes Wound Bed Granulation Amount: None Present (0%) Exposed Structure Necrotic Amount: Large (67-100%) Fascia Exposed: No Necrotic Quality: Eschar, Adherent Slough Fat Layer (Subcutaneous Tissue) Exposed: Yes Tendon Exposed: No Muscle Exposed: No Joint Exposed: No Bone Exposed: No Treatment Notes Wound #1 (Left Calcaneus) 1. Cleanse With Wound Cleanser 3. Primary Dressing Applied Santyl 4. Secondary Dressing Dry Gauze Roll Gauze Heel Cup 5. Secured With Medipore tape 7. Footwear/Offloading device applied Wedge shoe Notes netting. Electronic Signature(s) Signed: 08/12/2019 4:25:30 PM By: Baruch Gouty RN, BSN Signed: 08/12/2019 5:20:20 PM By: Minerva Fester Previous Signature: 08/07/2019 4:42:48 PM Version By: Baruch Gouty RN, BSN Entered By: Minerva Fester on 08/12/2019 09:29:25 -------------------------------------------------------------------------------- Wound Assessment Details Patient Name: Date of  Service: [name] 08/07/2019 10:45 A M Medical Record Number: [id number] Patient Account Number: [id number] Date of Birth/Sex: Treating RN: 1933-03-02 (86 y.o. Ernestene Mention Primary Care Kupono Marling: [name] Other Clinician: Referring Laramie Meissner: Treating [name]/Extender: Garfield Cornea in Treatment: 2 Wound Status Wound Number:  2 Primary Pressure Ulcer Etiology: Wound Location: Right Calcaneus Wound Status: Open Wounding Event: Gradually Appeared Comorbid Hypertension, Type II Diabetes, Osteomyelitis, Received Date Acquired: 06/06/2019 History: Radiation Weeks Of Treatment: 2 Clustered Wound: No Photos Wound Measurements Length: (cm) 5.3 Width: (cm) 5.1 Depth: (cm) 0.6 Area: (cm) 21.229 Volume: (cm) 12.738 % Reduction in Area: -4% % Reduction in Volume: -523.8% Epithelialization: None Tunneling: No Undermining: No Wound Description Classification: Unstageable/Unclassified Wound Margin: Distinct, outline attached Exudate Amount: Large Exudate Type: Purulent Exudate Color: yellow, brown, green Foul Odor After Cleansing: No Slough/Fibrino Yes Wound Bed Granulation Amount: None Present (0%) Exposed Structure Necrotic Amount: Large (67-100%) Fascia Exposed: No Necrotic Quality: Eschar, Adherent Slough Fat Layer (Subcutaneous Tissue) Exposed: Yes Tendon Exposed: No Muscle Exposed: No Joint Exposed: No Bone Exposed: No Treatment Notes Wound #2 (Right Calcaneus) 1. Cleanse With Wound Cleanser 3. Primary Dressing Applied Santyl 4. Secondary Dressing Dry Gauze Roll Gauze Heel Cup 5. Secured With Medipore tape Notes netting. Electronic Signature(s) Signed: 08/12/2019 4:25:30 PM By: Zenaida Deed RN, BSN Signed: 08/12/2019 5:20:20 PM By: Marlinda Mike Previous Signature: 08/07/2019 4:42:48 PM Version By: Zenaida Deed RN, BSN Entered By: Marlinda Mike on 08/12/2019  09:29:49 -------------------------------------------------------------------------------- Wound Assessment Details Patient Name: Date of Service: Ihor Gully ID D. 08/07/2019 10:45 A M Medical Record Number: [id number] Patient Account Number: [id number] Date of Birth/Sex: Treating RN: Nov 19, 1933 (86 y.o. Damaris Schooner Primary Care Ardine Iacovelli: [employer] Other Clinician: Referring Kamalei Roeder: Treating Rickie Gutierres/Extender: Sherril Cong in Treatment: 2 Wound Status Wound Number: 3 Primary Diabetic Wound/Ulcer of the Lower Extremity Etiology: Wound Location: Right T Fifth oe Wound Status: Open Wounding Event: Gradually Appeared Comorbid Hypertension, Type II Diabetes, Osteomyelitis, Received Date Acquired: 06/06/2019 History: Radiation Weeks Of Treatment: 2 Clustered Wound: No Photos Wound Measurements Length: (cm) 0.2 Width: (cm) 0.5 Depth: (cm) 0.1 Area: (cm) 0.079 Volume: (cm) 0.008 Wound Description Classification: Grade 2 Wound Margin: Flat and Intact Exudate Amount: Small Exudate Type: Serosanguineous Exudate Color: red, brown Foul Odor After Cleansing: % Reduction in Area: 16% % Reduction in Volume: 11.1% Epithelialization: Small (1-33%) Tunneling: No Undermining: No No Wound Bed Granulation Amount: None Present (0%) Exposed Structure Necrotic Amount: Large (67-100%) Fascia Exposed: No Necrotic Quality: Adherent Slough Fat Layer (Subcutaneous Tissue) Exposed: No Tendon Exposed: No Muscle Exposed: No Joint Exposed: No Bone Exposed: No Treatment Notes Wound #3 (Right Toe Fifth) 1. Cleanse With Wound Cleanser 2. Periwound Care Skin Prep 3. Primary Dressing Applied Santyl 4. Secondary Dressing Roll Gauze Foam Border Dressing 5. Secured With Medipore tape 7. Footwear/Offloading device applied Wedge shoe Electronic Signature(s) Signed: 08/12/2019 4:25:30 PM By: Zenaida Deed RN, BSN Signed: 08/12/2019 5:20:20 PM By:  Marlinda Mike Previous Signature: 08/07/2019 4:42:48 PM Version By: Zenaida Deed RN, BSN Entered By: Marlinda Mike on 08/12/2019 09:30:13 -------------------------------------------------------------------------------- Wound Assessment Details Patient Name: Date of Service: Ihor Gully ID D. 08/07/2019 10:45 A M Medical Record Number: [id number] Patient Account Number: [id number] Date of Birth/Sex: Treating RN: 04-09-1933 (86 y.o. Damaris Schooner Primary Care Welby Montminy: [employer] Other Clinician: Referring Brande Uncapher: Treating Nelma Phagan/Extender: Sherril Cong in Treatment: 2 Wound Status Wound Number: 4 Primary Diabetic Wound/Ulcer of the Lower Extremity Etiology: Wound Location: Right, Lateral Foot Wound Status: Open Wounding Event: Gradually Appeared Comorbid Hypertension, Type II Diabetes, Osteomyelitis, Received Date Acquired: 06/06/2019 History: Radiation Weeks Of Treatment: 2 Clustered Wound: No Photos Wound Measurements Length: (cm) 1 Width: (cm) 1.1 Depth: (cm) 0.3 Area: (cm) 0.864 Volume: (cm) 0.259 %  Reduction in Area: 23.1% % Reduction in Volume: 42.3% Epithelialization: None Tunneling: No Undermining: No Wound Description Classification: Grade 2 Wound Margin: Distinct, outline attached Exudate Amount: Medium Exudate Type: Serosanguineous Exudate Color: red, brown Foul Odor After Cleansing: No Slough/Fibrino Yes Wound Bed Granulation Amount: None Present (0%) Exposed Structure Necrotic Amount: Large (67-100%) Fascia Exposed: No Necrotic Quality: Adherent Slough Fat Layer (Subcutaneous Tissue) Exposed: Yes Tendon Exposed: No Muscle Exposed: No Joint Exposed: No Bone Exposed: No Treatment Notes Wound #4 (Right, Lateral Foot) 1. Cleanse With Wound Cleanser 2. Periwound Care Skin Prep 3. Primary Dressing Applied Santyl 4. Secondary Dressing Roll Gauze Foam Border Dressing 5. Secured With Medipore tape 7.  Footwear/Offloading device applied Wedge shoe Electronic Signature(s) Signed: 08/12/2019 4:25:30 PM By: Baruch Gouty RN, BSN Signed: 08/12/2019 5:20:20 PM By: Minerva Fester Previous Signature: 08/07/2019 4:42:48 PM Version By: Baruch Gouty RN, BSN Entered By: Minerva Fester on 08/12/2019 09:30:39 -------------------------------------------------------------------------------- Vitals Details Patient Name: Date of Service: [name] 08/07/2019 10:45 A M Medical Record Number: [id number] Patient Account Number: [id number] Date of Birth/Sex: Treating RN: 08-04-1933 (86 y.o. Ernestene Mention Primary Care Jaleen Finch: [name] Other Clinician: Referring Ryelee Albee: Treating Derrica Sieg/Extender: Garfield Cornea in Treatment: 2 Vital Signs Time Taken: 10:48 Temperature (F): 98.4 Height (in): 68 Pulse (bpm): 101 Source: Stated Respiratory Rate (breaths/min): 18 Weight (lbs): 172 Blood Pressure (mmHg): 150/83 Source: Stated Capillary Blood Glucose (mg/dl): 109 Body Mass Index (BMI): 26.1 Reference Range: 80 - 120 mg / dl Notes glucose per pt report this morning Electronic Signature(s) Signed: 08/07/2019 4:42:48 PM By: Baruch Gouty RN, BSN Entered By: Baruch Gouty on 08/07/2019 10:49:55

## 2019-08-10 ENCOUNTER — Encounter (HOSPITAL_BASED_OUTPATIENT_CLINIC_OR_DEPARTMENT_OTHER): Payer: Medicare Other | Admitting: Internal Medicine

## 2019-08-11 ENCOUNTER — Encounter: Payer: Self-pay | Admitting: Vascular Surgery

## 2019-08-11 ENCOUNTER — Ambulatory Visit (INDEPENDENT_AMBULATORY_CARE_PROVIDER_SITE_OTHER): Payer: Medicare Other | Admitting: Vascular Surgery

## 2019-08-11 ENCOUNTER — Other Ambulatory Visit: Payer: Self-pay

## 2019-08-11 DIAGNOSIS — I998 Other disorder of circulatory system: Secondary | ICD-10-CM

## 2019-08-11 DIAGNOSIS — I70229 Atherosclerosis of native arteries of extremities with rest pain, unspecified extremity: Secondary | ICD-10-CM | POA: Insufficient documentation

## 2019-08-11 NOTE — Progress Notes (Signed)
Patient name: Anthony Dunlap MRN: [id number] DOB: Dec 26, 1933 Sex: male  REASON FOR CONSULT: Bilateral heel wounds and evaluate for PAD  HPI: Anthony Dunlap is a 86 y.o. male, with history of prostate cancer, diabetes that presents for evaluation of bilateral heel wounds.  Patient initially underwent total hip replacement and was hospitalized from 06/12/19-06/16/19 and then discharged to the [facility].  Apparently while at the [facility] he developed these bilateral heel wounds in May of this year.  He states ultimately he has since been discharged and is back home and living with his wife.  He is ambulatory.  He was evaluated by [name] at the wound center and got noninvasive imaging and then referred to vascular surgery.  Apparently no debridement has been done in the wound care center and they are awaiting further evaluation by vascular surgery.  ABIs from 08/05/2019 showed ABI of 0.78 on the right with a toe pressure of 0 and ABI of 0.97 on the left.  Lower extremity arterial duplex showed tibial disease with right AT and left PT occluded.  Past Medical History:  Diagnosis Date  . 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 ([place]) 04/21/2018  . Diabetes ([place])   . Drug-induced hepatitis 06/05/2018  . Inguinal hernia    06/09/2019: per patient " a long time ago on the right side"  . Left eye pain   . Localized osteoarthrosis of right hip 01/05/2019  . Prostate cancer [facility])     Past Surgical History:  Procedure Laterality Date  . HERNIA REPAIR    . IR RADIOLOGIST EVAL & MGMT  03/06/2018  . PROSTATE SURGERY    . TOTAL HIP ARTHROPLASTY Right 06/12/2019   Procedure: RIGHT TOTAL HIP ARTHROPLASTY DIRECT ANTERIOR;  Surgeon: Marybelle Killings, MD;  Location: [place];  Service: Orthopedics;  Laterality: Right;    Family History  Problem Relation Age of Onset  . Pneumonia Father   . Cancer Sister     SOCIAL HISTORY: Social History   Socioeconomic History  . Marital status:  Married    Spouse name: [name]  . Number of children: 3  . Years of education: 9th  . Highest education level: Not on file  Occupational History    Employer: RETIRED    Comment: Retired  Tobacco Use  . Smoking status: Never Smoker  . Smokeless tobacco: Never Used  Vaping Use  . Vaping Use: Never used  Substance and Sexual Activity  . Alcohol use: No  . Drug use: No  . Sexual activity: Not on file  Other Topics Concern  . Not on file  Social History Narrative   Patient lives at home with his wife. [name]) . Patient is retired.   Education 9th grade.   Right handed.   Caffeine None   Social Determinants of Health   Financial Resource Strain:   . Difficulty of Paying Living Expenses:   Food Insecurity:   . Worried About Charity fundraiser in the Last Year:   . Arboriculturist in the Last Year:   Transportation Needs:   . Film/video editor (Medical):   [name] Lack of Transportation (Non-Medical):   Physical Activity:   . Days of Exercise per Week:   . Minutes of Exercise per Session:   Stress:   . Feeling of Stress :   Social Connections:   . Frequency of Communication with Friends and Family:   . Frequency of Social Gatherings with Friends  and Family:   . Attends Religious Services:   . Active Member of Clubs or Organizations:   . Attends Archivist Meetings:   [name] Marital Status:   Intimate Partner Violence:   . Fear of Current or Ex-Partner:   . Emotionally Abused:   [name] Physically Abused:   . Sexually Abused:     Allergies  Allergen Reactions  . Fluconazole Other (See Comments)    Drug-induced hepatitis    Current Outpatient Medications  Medication Sig Dispense Refill  . ALPRAZolam (XANAX) 0.5 MG tablet Take 0.5 mg by mouth 3 (three) times daily as needed.    [name] amitriptyline (ELAVIL) 10 MG tablet Take 20 mg by mouth at bedtime.     [name] amoxicillin-clavulanate (AUGMENTIN) 875-125 MG tablet TAKE 1 TABLET BY MOUTH TWICE DAILY 60 tablet 11  .  aspirin EC 325 MG EC tablet Take 1 tablet (325 mg total) by mouth daily with breakfast. Must take at least 4 weeks postop for DVT prophylaxis 30 tablet 0  . colchicine 0.6 MG tablet     . ferrous sulfate 325 (65 FE) MG tablet Take 1 tablet (325 mg total) by mouth daily with breakfast. (Patient taking differently: Take 325 mg by mouth every evening. )  3  . furosemide (LASIX) 20 MG tablet Take 20 mg by mouth every morning.     [name] glimepiride (AMARYL) 4 MG tablet Take 4 mg by mouth every morning.     [name] HYDROcodone-acetaminophen (NORCO/VICODIN) 5-325 MG tablet Take 1 tablet by mouth every 6 (six) hours as needed for moderate pain. 30 tablet 0  . insulin aspart (NOVOLOG) 100 UNIT/ML injection Inject 0-15 Units into the skin 3 (three) times daily with meals. Sliding scale insulin Less than 70 initiate hypoglycemia protocol 70-120  0 units 120-150 2 unit 151-200 3 units 201-250 3 units 251-300 5 units 301-350 8 units 351-400 11 units  Greater than 400 15 units , call MD (Patient taking differently: Inject 0-15 Units into the skin See admin instructions. Sliding scale insulin as needed for high blood sugars Less than 70 initiate hypoglycemia protocol 70-120  1 units 120-150 3 unit 151-200 4 units 201-250 5 units 251-300 6 units 301-350 9units 351-400 12 units  Greater than 400 15 units , call MD) 10 mL 11  . levothyroxine (SYNTHROID, LEVOTHROID) 100 MCG tablet Take 100 mcg by mouth every morning.     . meloxicam (MOBIC) 7.5 MG tablet Take 7.5 mg by mouth 2 (two) times daily.    [name] nystatin (MYCOSTATIN/NYSTOP) powder Apply topically 4 (four) times daily. 60 g 8  . pravastatin (PRAVACHOL) 10 MG tablet Take 10 mg by mouth every evening.     . primidone (MYSOLINE) 50 MG tablet Take 100 mg by mouth at bedtime.     Willeen Niece 100-33 UNT-MCG/ML SOPN Inject 44 Units as directed every morning.     [name] HYDROcodone-acetaminophen (NORCO) 10-325 MG tablet Take 1 tablet by mouth every 6 (six) hours as needed.  (Patient not taking: Reported on 08/11/2019) 50 tablet 0   No current facility-administered medications for this visit.    REVIEW OF SYSTEMS:  [X]  denotes positive finding, [ ]  denotes negative finding Cardiac  Comments:  Chest pain or chest pressure:    Shortness of breath upon exertion:    Short of breath when lying flat:    Irregular heart rhythm:        Vascular    Pain in calf, thigh, or hip brought on by  ambulation:    Pain in feet at night that wakes you up from your sleep:     Blood clot in your veins:    Leg swelling:         Pulmonary    Oxygen at home:    Productive cough:     Wheezing:         Neurologic    Sudden weakness in arms or legs:     Sudden numbness in arms or legs:     Sudden onset of difficulty speaking or slurred speech:    Temporary loss of vision in one eye:     Problems with dizziness:         Gastrointestinal    Blood in stool:     Vomited blood:         Genitourinary    Burning when urinating:     Blood in urine:        Psychiatric    Major depression:         Hematologic    Bleeding problems:    Problems with blood clotting too easily:        Skin    Rashes or ulcers:        Constitutional    Fever or chills:      PHYSICAL EXAM: Vitals:   08/11/19 1507  BP: (!) 155/86  Pulse: 62  Resp: 18  Temp: (!) 97.2 F (36.2 C)  TempSrc: Temporal  SpO2: 100%  Weight: 171 lb (77.6 kg)  Height: 5\' 8"  (1.727 m)    GENERAL: The patient is a well-nourished male, in no acute distress. The vital signs are documented above. CARDIAC: There is a regular rate and rhythm.  VASCULAR:  Palpable femoral pulses bilaterally No right pedal pulses palpable Right heel wound and right fifth metatarsal wound as pictured below Left dorsalis pedis palpable Left heel wound as pictured below PULMONARY: There is good air exchange bilaterally without wheezing or rales. ABDOMEN: Soft and non-tender with normal pitched bowel sounds.  MUSCULOSKELETAL:  There are no major deformities or cyanosis. NEUROLOGIC: No focal weakness or paresthesias are detected. SKIN: Bilateral heel ulcers - extensive - right 5th metatarsal ulcer PSYCHIATRIC: The patient has a normal affect.         DATA:   I independently reviewed his ABIs which are 0.78 on the right with a triphasic waveform but a toe pressure of 0 and 0.97 on the left with a triphasic waveform  Assessment/Plan:  86 year old male presents with extensive bilateral lower extremity tissue loss as pictured above with bilateral heel wounds and a right 5th metatarsal ulcer.  Discussed with him and his caregiver that these appear to be pressure ulcerations from his prolonged stay at the [facility].  Fortunately he is ambulatory and has been discharged home and I think there is certainly a role for limb salvage.  I discussed with him and his caregiver that the heel wounds are going to be extremely difficult to heal even with revascularization given the extent of tissue loss and high risk for limb loss.  I think he merits at least a lower extremity arteriogram given diminished ABI of 0.78 on the right with a toe pressure of 0 and I cannot palpate a pedal pulse on the right.  On the left I can appreciate a dorsalis pedis and he has a normal ABI and will get run-off on left as well to ensure he is optimized for wound care.  Risk and benefits were  discussed with him in detail.  We will schedule for tomorrow.  Will allow the wound center with [name] to continue to take care of him and appreciate their assistance.   Marty Heck, MD [facility] Office: [phone]

## 2019-08-12 ENCOUNTER — Other Ambulatory Visit: Payer: Self-pay

## 2019-08-12 ENCOUNTER — Ambulatory Visit (HOSPITAL_COMMUNITY)
Admission: RE | Admit: 2019-08-12 | Discharge: 2019-08-12 | Disposition: A | Discharge status: Home / Self Care | Payer: Medicare Other | Attending: Vascular Surgery | Admitting: Vascular Surgery

## 2019-08-12 ENCOUNTER — Encounter (HOSPITAL_COMMUNITY)
Admission: RE | Disposition: A | Discharge status: Home / Self Care | Payer: Self-pay | Source: Home / Self Care | Attending: Vascular Surgery

## 2019-08-12 DIAGNOSIS — Z7982 Long term (current) use of aspirin: Secondary | ICD-10-CM | POA: Diagnosis not present

## 2019-08-12 DIAGNOSIS — Z794 Long term (current) use of insulin: Secondary | ICD-10-CM | POA: Insufficient documentation

## 2019-08-12 DIAGNOSIS — L97429 Non-pressure chronic ulcer of left heel and midfoot with unspecified severity: Secondary | ICD-10-CM | POA: Insufficient documentation

## 2019-08-12 DIAGNOSIS — E11621 Type 2 diabetes mellitus with foot ulcer: Secondary | ICD-10-CM | POA: Insufficient documentation

## 2019-08-12 DIAGNOSIS — Z79899 Other long term (current) drug therapy: Secondary | ICD-10-CM | POA: Diagnosis not present

## 2019-08-12 DIAGNOSIS — I70213 Atherosclerosis of native arteries of extremities with intermittent claudication, bilateral legs: Secondary | ICD-10-CM | POA: Diagnosis present

## 2019-08-12 DIAGNOSIS — E1151 Type 2 diabetes mellitus with diabetic peripheral angiopathy without gangrene: Secondary | ICD-10-CM | POA: Diagnosis not present

## 2019-08-12 DIAGNOSIS — I70244 Atherosclerosis of native arteries of left leg with ulceration of heel and midfoot: Secondary | ICD-10-CM

## 2019-08-12 DIAGNOSIS — Z79890 Hormone replacement therapy (postmenopausal): Secondary | ICD-10-CM | POA: Diagnosis not present

## 2019-08-12 DIAGNOSIS — I70234 Atherosclerosis of native arteries of right leg with ulceration of heel and midfoot: Secondary | ICD-10-CM | POA: Insufficient documentation

## 2019-08-12 DIAGNOSIS — Z8546 Personal history of malignant neoplasm of prostate: Secondary | ICD-10-CM | POA: Insufficient documentation

## 2019-08-12 DIAGNOSIS — L97419 Non-pressure chronic ulcer of right heel and midfoot with unspecified severity: Secondary | ICD-10-CM | POA: Diagnosis not present

## 2019-08-12 HISTORY — PX: ABDOMINAL AORTOGRAM W/LOWER EXTREMITY: CATH118223

## 2019-08-12 LAB — POCT I-STAT, CHEM 8
BUN: 16 mg/dL (ref 8–23)
Calcium, Ion: 1.28 mmol/L (ref 1.15–1.40)
Chloride: 104 mmol/L (ref 98–111)
Creatinine, Ser: 0.9 mg/dL (ref 0.61–1.24)
Glucose, Bld: 170 mg/dL — ABNORMAL HIGH (ref 70–99)
HCT: 31 % — ABNORMAL LOW (ref 39.0–52.0)
Hemoglobin: 10.5 g/dL — ABNORMAL LOW (ref 13.0–17.0)
Potassium: 4.1 mmol/L (ref 3.5–5.1)
Sodium: 140 mmol/L (ref 135–145)
TCO2: 25 mmol/L (ref 22–32)

## 2019-08-12 SURGERY — ABDOMINAL AORTOGRAM W/LOWER EXTREMITY
Anesthesia: LOCAL | Laterality: Bilateral

## 2019-08-12 MED ORDER — LIDOCAINE HCL (PF) 1 % IJ SOLN
INTRAMUSCULAR | Status: DC | PRN
  Administered 2019-08-12: 15 mL via INTRADERMAL

## 2019-08-12 MED ORDER — LABETALOL HCL 5 MG/ML IV SOLN
INTRAVENOUS | Status: DC | PRN
  Administered 2019-08-12: 10 mg via INTRAVENOUS

## 2019-08-12 MED ORDER — HEPARIN (PORCINE) IN NACL 1000-0.9 UT/500ML-% IV SOLN
INTRAVENOUS | Status: DC | PRN
  Administered 2019-08-12 (×2): 500 mL

## 2019-08-12 MED ORDER — LIDOCAINE HCL (PF) 1 % IJ SOLN
INTRAMUSCULAR | Status: AC
  Filled 2019-08-12: qty 30

## 2019-08-12 MED ORDER — SODIUM CHLORIDE 0.9 % IV SOLN: INTRAVENOUS | Status: DC

## 2019-08-12 MED ORDER — HEPARIN (PORCINE) IN NACL 1000-0.9 UT/500ML-% IV SOLN
INTRAVENOUS | Status: AC
  Filled 2019-08-12: qty 500

## 2019-08-12 MED ORDER — LABETALOL HCL 5 MG/ML IV SOLN
INTRAVENOUS | Status: AC
  Filled 2019-08-12: qty 4

## 2019-08-12 MED ORDER — IODIXANOL 320 MG/ML IV SOLN
INTRAVENOUS | Status: DC | PRN
  Administered 2019-08-12: 130 mL via INTRA_ARTERIAL

## 2019-08-12 SURGICAL SUPPLY — 12 items
CATH OMNI FLUSH 5F 65CM (CATHETERS) ×1 IMPLANT
CATH SOFT-VU ST 4F 90CM (CATHETERS) ×1 IMPLANT
DEVICE CLOSURE MYNXGRIP 5F (Vascular Products) ×1 IMPLANT
KIT MICROPUNCTURE NIT STIFF (SHEATH) ×1 IMPLANT
KIT PV (KITS) ×2 IMPLANT
SHEATH PINNACLE 5F 10CM (SHEATH) ×1 IMPLANT
SHEATH PROBE COVER 6X72 (BAG) ×1 IMPLANT
SYR MEDRAD MARK V 150ML (SYRINGE) ×1 IMPLANT
TRANSDUCER W/STOPCOCK (MISCELLANEOUS) ×2 IMPLANT
TRAY PV CATH (CUSTOM PROCEDURE TRAY) ×2 IMPLANT
WIRE ROSEN-J .035X260CM (WIRE) ×1 IMPLANT
WIRE STARTER BENTSON 035X150 (WIRE) ×1 IMPLANT

## 2019-08-12 NOTE — Discharge Instructions (Signed)
   [facility]  Discharge Instructions  Lower Extremity Angiogram; Angioplasty/Stenting  Please refer to the following instructions for your post-procedure care. Your surgeon or physician assistant will discuss any changes with you.  Activity  Avoid lifting more than 8 pounds (1 gallons of milk) for 72 hours (3 days) after your procedure. You may walk as much as you can tolerate. It's OK to drive after 72 hours.  Bathing/Showering  You may shower the day after your procedure. If you have a bandage, you may remove it at 24- 48 hours. Clean your incision site with mild soap and water. Pat the area dry with a clean towel.  Diet  DRINK PLENTY OF FLUIDS TO STAY HYDRATED  Resume your pre-procedure diet. There are no special food restrictions following this procedure. All patients with peripheral vascular disease should follow a low fat/low cholesterol diet. In order to heal from your surgery, it is CRITICAL to get adequate nutrition. Your body requires vitamins, minerals, and protein. Vegetables are the best source of vitamins and minerals. Vegetables also provide the perfect balance of protein. Processed food has little nutritional value, so try to avoid this.  Medications  Resume taking all of your medications unless your doctor tells you not to. If your incision is causing pain, you may take over-the-counter pain relievers such as acetaminophen (Tylenol)  Follow Up  Follow up will be arranged at the time of your procedure. You may have an office visit scheduled or may be scheduled for surgery. Ask your surgeon if you have any questions.  Please call us immediately for any of the following conditions: .Severe or worsening pain your legs or feet at rest or with walking. .Increased pain, redness, drainage at your groin puncture site. .Fever of 101 degrees or higher. .If you have any mild or slow bleeding from your puncture site: lie down, apply firm  constant pressure over the area with a piece of gauze or a clean wash cloth for 30 minutes- no peeking!, call 911 right away if you are still bleeding after 30 minutes, or if the bleeding is heavy and unmanageable.  Reduce your risk factors of vascular disease:  Stop smoking. If you would like help call [facility] at 1-800-QUIT-NOW 5103196927) or [place] at [phone]. Manage your cholesterol Maintain a desired weight Control your diabetes Keep your blood pressure down  If you have any questions, please call the office at [phone]

## 2019-08-12 NOTE — H&P (Signed)
History and Physical Interval Note:  08/12/2019 2:20 PM  Anthony Dunlap  has presented today for surgery, with the diagnosis of lower limb ischemia.  The various methods of treatment have been discussed with the patient and family. After consideration of risks, benefits and other options for treatment, the patient has consented to  Procedure(s): ABDOMINAL AORTOGRAM W/LOWER EXTREMITY (N/A) as a surgical intervention.  The patient's history has been reviewed, patient examined, no change in status, stable for surgery.  I have reviewed the patient's chart and labs.  Questions were answered to the patient's satisfaction.    Aortogram, lower extremity arteriogram, bilateral lower extremity tissue loss.  Marty Heck  Patient name: Anthony Dunlap  MRN: [id number]        DOB: [birth year]          Sex: male  REASON FOR CONSULT: Bilateral heel wounds and evaluate for PAD  HPI: Anthony Dunlap is a 86 y.o. male, with history of prostate cancer, diabetes that presents for evaluation of bilateral heel wounds.  Patient initially underwent total hip replacement and was hospitalized from 06/12/19-06/16/19 and then discharged to the [facility].  Apparently while at the [facility] he developed these bilateral heel wounds in May of this year.  He states ultimately he has since been discharged and is back home and living with his wife.  He is ambulatory.  He was evaluated by [name] at the wound center and got noninvasive imaging and then referred to vascular surgery.  Apparently no debridement has been done in the wound care center and they are awaiting further evaluation by vascular surgery.  ABIs from 08/05/2019 showed ABI of 0.78 on the right with a toe pressure of 0 and ABI of 0.97 on the left.  Lower extremity arterial duplex showed tibial disease with right AT and left PT occluded.      Past Medical History:  Diagnosis Date  . 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 ([place]) 04/21/2018   . Diabetes ([place])   . Drug-induced hepatitis 06/05/2018  . Inguinal hernia    06/09/2019: per patient " a long time ago on the right side"  . Left eye pain   . Localized osteoarthrosis of right hip 01/05/2019  . Prostate cancer [facility])          Past Surgical History:  Procedure Laterality Date  . HERNIA REPAIR    . IR RADIOLOGIST EVAL & MGMT  03/06/2018  . PROSTATE SURGERY    . TOTAL HIP ARTHROPLASTY Right 06/12/2019   Procedure: RIGHT TOTAL HIP ARTHROPLASTY DIRECT ANTERIOR;  Surgeon: Marybelle Killings, MD;  Location: [place];  Service: Orthopedics;  Laterality: Right;         Family History  Problem Relation Age of Onset  . Pneumonia Father   . Cancer Sister     SOCIAL HISTORY: Social History        Socioeconomic History  . Marital status: Married    Spouse name: [name]  . Number of children: 3  . Years of education: 9th  . Highest education level: Not on file  Occupational History    Employer: RETIRED    Comment: Retired  Tobacco Use  . Smoking status: Never Smoker  . Smokeless tobacco: Never Used  Vaping Use  . Vaping Use: Never used  Substance and Sexual Activity  . Alcohol use: No  . Drug use: No  . Sexual activity: Not on file  Other Topics Concern  . Not on file  Social History Narrative   Patient lives at home with his wife. [name]) . Patient is retired.   Education 9th grade.   Right handed.   Caffeine None   Social Determinants of Health      Financial Resource Strain:   . Difficulty of Paying Living Expenses:   Food Insecurity:   . Worried About Charity fundraiser in the Last Year:   . Arboriculturist in the Last Year:   Transportation Needs:   . Film/video editor (Medical):   [name] Lack of Transportation (Non-Medical):   Physical Activity:   . Days of Exercise per Week:   . Minutes of Exercise per Session:   Stress:   . Feeling of Stress :   Social Connections:   . Frequency of Communication with Friends  and Family:   . Frequency of Social Gatherings with Friends and Family:   . Attends Religious Services:   . Active Member of Clubs or Organizations:   . Attends Archivist Meetings:   [name] Marital Status:   Intimate Partner Violence:   . Fear of Current or Ex-Partner:   . Emotionally Abused:   [name] Physically Abused:   . Sexually Abused:          Allergies  Allergen Reactions  . Fluconazole Other (See Comments)    Drug-induced hepatitis          Current Outpatient Medications  Medication Sig Dispense Refill  . ALPRAZolam (XANAX) 0.5 MG tablet Take 0.5 mg by mouth 3 (three) times daily as needed.    [name] amitriptyline (ELAVIL) 10 MG tablet Take 20 mg by mouth at bedtime.     [name] amoxicillin-clavulanate (AUGMENTIN) 875-125 MG tablet TAKE 1 TABLET BY MOUTH TWICE DAILY 60 tablet 11  . aspirin EC 325 MG EC tablet Take 1 tablet (325 mg total) by mouth daily with breakfast. Must take at least 4 weeks postop for DVT prophylaxis 30 tablet 0  . colchicine 0.6 MG tablet     . ferrous sulfate 325 (65 FE) MG tablet Take 1 tablet (325 mg total) by mouth daily with breakfast. (Patient taking differently: Take 325 mg by mouth every evening. )  3  . furosemide (LASIX) 20 MG tablet Take 20 mg by mouth every morning.     [name] glimepiride (AMARYL) 4 MG tablet Take 4 mg by mouth every morning.     [name] HYDROcodone-acetaminophen (NORCO/VICODIN) 5-325 MG tablet Take 1 tablet by mouth every 6 (six) hours as needed for moderate pain. 30 tablet 0  . insulin aspart (NOVOLOG) 100 UNIT/ML injection Inject 0-15 Units into the skin 3 (three) times daily with meals. Sliding scale insulin Less than 70 initiate hypoglycemia protocol 70-120  0 units 120-150 2 unit 151-200 3 units 201-250 3 units 251-300 5 units 301-350 8 units 351-400 11 units  Greater than 400 15 units , call MD (Patient taking differently: Inject 0-15 Units into the skin See admin instructions. Sliding scale insulin as needed for  high blood sugars Less than 70 initiate hypoglycemia protocol 70-120  1 units 120-150 3 unit 151-200 4 units 201-250 5 units 251-300 6 units 301-350 9units 351-400 12 units  Greater than 400 15 units , call MD) 10 mL 11  . levothyroxine (SYNTHROID, LEVOTHROID) 100 MCG tablet Take 100 mcg by mouth every morning.     . meloxicam (MOBIC) 7.5 MG tablet Take 7.5 mg by mouth 2 (two) times daily.    [name] nystatin (MYCOSTATIN/NYSTOP) powder  Apply topically 4 (four) times daily. 60 g 8  . pravastatin (PRAVACHOL) 10 MG tablet Take 10 mg by mouth every evening.     . primidone (MYSOLINE) 50 MG tablet Take 100 mg by mouth at bedtime.     Willeen Niece 100-33 UNT-MCG/ML SOPN Inject 44 Units as directed every morning.     [name] HYDROcodone-acetaminophen (NORCO) 10-325 MG tablet Take 1 tablet by mouth every 6 (six) hours as needed. (Patient not taking: Reported on 08/11/2019) 50 tablet 0   No current facility-administered medications for this visit.    REVIEW OF SYSTEMS:  [X]  denotes positive finding, [ ]  denotes negative finding Cardiac  Comments:  Chest pain or chest pressure:    Shortness of breath upon exertion:    Short of breath when lying flat:    Irregular heart rhythm:        Vascular    Pain in calf, thigh, or hip brought on by ambulation:    Pain in feet at night that wakes you up from your sleep:     Blood clot in your veins:    Leg swelling:         Pulmonary    Oxygen at home:    Productive cough:     Wheezing:         Neurologic    Sudden weakness in arms or legs:     Sudden numbness in arms or legs:     Sudden onset of difficulty speaking or slurred speech:    Temporary loss of vision in one eye:     Problems with dizziness:         Gastrointestinal    Blood in stool:     Vomited blood:         Genitourinary    Burning when urinating:     Blood in urine:        Psychiatric    Major  depression:         Hematologic    Bleeding problems:    Problems with blood clotting too easily:        Skin    Rashes or ulcers:        Constitutional    Fever or chills:      PHYSICAL EXAM:    Vitals:   08/11/19 1507  BP: (!) 155/86  Pulse: 62  Resp: 18  Temp: (!) 97.2 F (36.2 C)  TempSrc: Temporal  SpO2: 100%  Weight: 171 lb (77.6 kg)  Height: 5\' 8"  (1.727 m)    GENERAL: The patient is a well-nourished male, in no acute distress. The vital signs are documented above. CARDIAC: There is a regular rate and rhythm.  VASCULAR:  Palpable femoral pulses bilaterally No right pedal pulses palpable Right heel wound and right fifth metatarsal wound as pictured below Left dorsalis pedis palpable Left heel wound as pictured below PULMONARY: There is good air exchange bilaterally without wheezing or rales. ABDOMEN: Soft and non-tender with normal pitched bowel sounds.  MUSCULOSKELETAL: There are no major deformities or cyanosis. NEUROLOGIC: No focal weakness or paresthesias are detected. SKIN: Bilateral heel ulcers - extensive - right 5th metatarsal ulcer PSYCHIATRIC: The patient has a normal affect.         DATA:   I independently reviewed his ABIs which are 0.78 on the right with a triphasic waveform but a toe pressure of 0 and 0.97 on the left with a triphasic waveform  Assessment/Plan:  86 year old male presents with extensive bilateral lower  extremity tissue loss as pictured above with bilateral heel wounds and a right 5th metatarsal ulcer.  Discussed with him and his caregiver that these appear to be pressure ulcerations from his prolonged stay at the [facility].  Fortunately he is ambulatory and has been discharged home and I think there is certainly a role for limb salvage.  I discussed with him and his caregiver that the heel wounds are going to be extremely difficult to heal even with revascularization given the extent  of tissue loss and high risk for limb loss.  I think he merits at least a lower extremity arteriogram given diminished ABI of 0.78 on the right with a toe pressure of 0 and I cannot palpate a pedal pulse on the right.  On the left I can appreciate a dorsalis pedis and he has a normal ABI and will get run-off on left as well to ensure he is optimized for wound care.  Risk and benefits were discussed with him in detail.  We will schedule for tomorrow.  Will allow the wound center with [name] to continue to take care of him and appreciate their assistance.   Marty Heck, MD [facility] [place] Office: [phone]

## 2019-08-12 NOTE — Op Note (Signed)
Patient name: Anthony Dunlap MRN: [id number] DOB: May 05, 1933 Sex: male  08/12/2019 Pre-operative Diagnosis: Critical limb ischemia of the bilateral lower extremities with tissue loss Post-operative diagnosis:  Same Surgeon:  Marty Heck, MD Procedure Performed: 1.  Ultrasound-guided access of the left common femoral artery 2.  Aortogram including catheter selection of the aorta 3.  Bilateral lower extremity arteriogram with runoff 4.  Mynx closure of the left common femoral artery  Indications: Patient is a 86 year old male who was seen in consultation yesterday with extensive tissue loss with bilateral heel wounds and a right 5th metatarsal wound following hip surgery with prolonged recovery in the [facility].  He presents today for planned lower extremity arteriogram and possible intervention after risk benefits discussed.  Findings:   Aortogram showed patent renal arteries bilaterally with no flow-limiting stenosis in the aortoiliac segment.  Right lower extremity arteriogram showed a patent common femoral, profunda, SFA, as well as above and below-knee popliteal artery.  He has severe tibial disease in the right lower extremity.  Anterior tibial occluded shortly after takeoff with no distal reconstitution and no identifiable dorsalis pedis in the foot.  Posterior tibial appeared occluded throughout its course except for a small island that reconstituted off a collateral from the peroneal.  Peroneal was the dominant runoff in the right lower extremity but occluded in the mid calf where he then had a large collateral that filled a small island of posterior tibial that was very diseased but then the posterior tibial occluded again at the level of the ankle with a collateral filling the plantar arch.    Left lower extremity arteriogram shows a patent common femoral, profunda, SFA, as well as above and below-knee popliteal artery.  Dominant runoff in the left lower extremity was via  the anterior tibial that has at least 2-3 high-grade stenosis.  Posterior tibial appears occluded.  There is a mid peroneal that reconstitutes through a collateral off anterior tibial but this is occluded proximally.   Procedure:  The patient was identified in the holding area and taken to room 8.  The patient was then placed supine on the table and prepped and draped in the usual sterile fashion.  A time out was called.  Ultrasound was used to evaluate the left common femoral artery.  It was patent .  A digital ultrasound image was acquired.  A micropuncture needle was used to access the left common femoral artery under ultrasound guidance.  An 018 wire was advanced without resistance and a micropuncture sheath was placed.  The 018 wire was removed and a benson wire was placed.  The micropuncture sheath was exchanged for a 5 french sheath.  An omniflush catheter was advanced over the wire to the level of L-1.  An abdominal angiogram was obtained.  Next catheter was pulled down and bilateral lower extremity runoff was obtained.  Pertinent findings are noted above.  In order to get better imaging of the right leg we ultimately used a straight wire with a Omni Flush catheter to select the right iliac as well as the right SFA and placed a long 100 cm straight flush catheter down the SFA.  We then got dedicated tibial imaging of the right lower extremity.  Pertinent images are noted above.  Ultimately with no options for revascularization including I do not think he is a candidate for retrograde tibial access the wires and catheters were removed.  A mynx closure device was deployed in the left common femoral  artery and pressure was held for 5 minutes.  Taken to recovery in stable condition.  Plan: I do not think the patient has any revascularization options in the right lower extremity.  I recommended a right below-knee amputation.  He would like to think about this and discuss with family.  I will bring her back  next week for staged intervention on the left leg.  He will need to continue going to the wound care center in the interim.  Marty Heck, MD [facility] [place] Office: [phone]

## 2019-08-13 ENCOUNTER — Other Ambulatory Visit: Payer: Self-pay

## 2019-08-13 ENCOUNTER — Encounter (HOSPITAL_COMMUNITY): Payer: Self-pay | Admitting: Vascular Surgery

## 2019-08-13 LAB — POCT I-STAT, CHEM 8
BUN: 18 mg/dL (ref 8–23)
Calcium, Ion: 1.21 mmol/L (ref 1.15–1.40)
Chloride: 101 mmol/L (ref 98–111)
Creatinine, Ser: 1 mg/dL (ref 0.61–1.24)
Glucose, Bld: 233 mg/dL — ABNORMAL HIGH (ref 70–99)
HCT: 31 % — ABNORMAL LOW (ref 39.0–52.0)
Hemoglobin: 10.5 g/dL — ABNORMAL LOW (ref 13.0–17.0)
Potassium: 4.6 mmol/L (ref 3.5–5.1)
Sodium: 140 mmol/L (ref 135–145)
TCO2: 27 mmol/L (ref 22–32)

## 2019-08-17 ENCOUNTER — Other Ambulatory Visit (HOSPITAL_COMMUNITY)
Admission: RE | Admit: 2019-08-17 | Discharge: 2019-08-17 | Disposition: A | Discharge status: Home / Self Care | Payer: Medicare Other | Source: Ambulatory Visit | Attending: Vascular Surgery | Admitting: Vascular Surgery

## 2019-08-17 DIAGNOSIS — Z20822 Contact with and (suspected) exposure to covid-19: Secondary | ICD-10-CM | POA: Insufficient documentation

## 2019-08-17 DIAGNOSIS — Z01812 Encounter for preprocedural laboratory examination: Secondary | ICD-10-CM | POA: Diagnosis present

## 2019-08-17 LAB — SARS CORONAVIRUS 2 (TAT 6-24 HRS): SARS Coronavirus 2: NEGATIVE

## 2019-08-19 ENCOUNTER — Ambulatory Visit (HOSPITAL_COMMUNITY)
Admission: RE | Admit: 2019-08-19 | Discharge: 2019-08-19 | Disposition: A | Discharge status: Home / Self Care | Payer: Medicare Other | Attending: Vascular Surgery | Admitting: Vascular Surgery

## 2019-08-19 ENCOUNTER — Ambulatory Visit (HOSPITAL_COMMUNITY)
Admission: RE | Disposition: A | Discharge status: Home / Self Care | Payer: Self-pay | Source: Home / Self Care | Attending: Vascular Surgery

## 2019-08-19 ENCOUNTER — Other Ambulatory Visit: Payer: Self-pay

## 2019-08-19 DIAGNOSIS — Z79899 Other long term (current) drug therapy: Secondary | ICD-10-CM | POA: Diagnosis not present

## 2019-08-19 DIAGNOSIS — L97419 Non-pressure chronic ulcer of right heel and midfoot with unspecified severity: Secondary | ICD-10-CM | POA: Insufficient documentation

## 2019-08-19 DIAGNOSIS — E11621 Type 2 diabetes mellitus with foot ulcer: Secondary | ICD-10-CM | POA: Diagnosis present

## 2019-08-19 DIAGNOSIS — K7589 Other specified inflammatory liver diseases: Secondary | ICD-10-CM | POA: Diagnosis not present

## 2019-08-19 DIAGNOSIS — Z888 Allergy status to other drugs, medicaments and biological substances status: Secondary | ICD-10-CM | POA: Insufficient documentation

## 2019-08-19 DIAGNOSIS — L97429 Non-pressure chronic ulcer of left heel and midfoot with unspecified severity: Secondary | ICD-10-CM | POA: Insufficient documentation

## 2019-08-19 DIAGNOSIS — Z96641 Presence of right artificial hip joint: Secondary | ICD-10-CM | POA: Insufficient documentation

## 2019-08-19 DIAGNOSIS — Z79890 Hormone replacement therapy (postmenopausal): Secondary | ICD-10-CM | POA: Diagnosis not present

## 2019-08-19 DIAGNOSIS — M1611 Unilateral primary osteoarthritis, right hip: Secondary | ICD-10-CM | POA: Diagnosis not present

## 2019-08-19 DIAGNOSIS — I70234 Atherosclerosis of native arteries of right leg with ulceration of heel and midfoot: Secondary | ICD-10-CM | POA: Insufficient documentation

## 2019-08-19 DIAGNOSIS — Z7982 Long term (current) use of aspirin: Secondary | ICD-10-CM | POA: Insufficient documentation

## 2019-08-19 DIAGNOSIS — Z794 Long term (current) use of insulin: Secondary | ICD-10-CM | POA: Diagnosis not present

## 2019-08-19 DIAGNOSIS — I70244 Atherosclerosis of native arteries of left leg with ulceration of heel and midfoot: Secondary | ICD-10-CM | POA: Diagnosis not present

## 2019-08-19 DIAGNOSIS — I998 Other disorder of circulatory system: Secondary | ICD-10-CM

## 2019-08-19 HISTORY — PX: PERIPHERAL VASCULAR BALLOON ANGIOPLASTY: CATH118281

## 2019-08-19 HISTORY — PX: ABDOMINAL AORTOGRAM W/LOWER EXTREMITY: CATH118223

## 2019-08-19 LAB — POCT I-STAT, CHEM 8
BUN: 24 mg/dL — ABNORMAL HIGH (ref 8–23)
Calcium, Ion: 1.28 mmol/L (ref 1.15–1.40)
Chloride: 102 mmol/L (ref 98–111)
Creatinine, Ser: 1 mg/dL (ref 0.61–1.24)
Glucose, Bld: 141 mg/dL — ABNORMAL HIGH (ref 70–99)
HCT: 30 % — ABNORMAL LOW (ref 39.0–52.0)
Hemoglobin: 10.2 g/dL — ABNORMAL LOW (ref 13.0–17.0)
Potassium: 3.8 mmol/L (ref 3.5–5.1)
Sodium: 140 mmol/L (ref 135–145)
TCO2: 25 mmol/L (ref 22–32)

## 2019-08-19 SURGERY — ABDOMINAL AORTOGRAM W/LOWER EXTREMITY
Anesthesia: LOCAL | Laterality: Left

## 2019-08-19 MED ORDER — IODIXANOL 320 MG/ML IV SOLN
INTRAVENOUS | Status: DC | PRN
  Administered 2019-08-19: 50 mL via INTRA_ARTERIAL

## 2019-08-19 MED ORDER — CLOPIDOGREL BISULFATE 75 MG PO TABS: 300.0000 mg | ORAL_TABLET | Freq: Once | ORAL | Status: DC

## 2019-08-19 MED ORDER — LABETALOL HCL 5 MG/ML IV SOLN: 10.0000 mg | INTRAVENOUS | Status: DC | PRN

## 2019-08-19 MED ORDER — SODIUM CHLORIDE 0.9 % IV SOLN: 250.0000 mL | INTRAVENOUS | Status: DC | PRN

## 2019-08-19 MED ORDER — LIDOCAINE HCL (PF) 1 % IJ SOLN
INTRAMUSCULAR | Status: DC | PRN
  Administered 2019-08-19: 20 mL

## 2019-08-19 MED ORDER — SODIUM CHLORIDE 0.9% FLUSH: 3.0000 mL | Freq: Two times a day (BID) | INTRAVENOUS | Status: DC

## 2019-08-19 MED ORDER — HEPARIN (PORCINE) IN NACL 1000-0.9 UT/500ML-% IV SOLN
INTRAVENOUS | Status: AC
  Filled 2019-08-19: qty 1000

## 2019-08-19 MED ORDER — LIDOCAINE HCL (PF) 1 % IJ SOLN
INTRAMUSCULAR | Status: AC
  Filled 2019-08-19: qty 30

## 2019-08-19 MED ORDER — SODIUM CHLORIDE 0.9 % WEIGHT BASED INFUSION: 1.0000 mL/kg/h | INTRAVENOUS | Status: DC

## 2019-08-19 MED ORDER — HEPARIN (PORCINE) IN NACL 1000-0.9 UT/500ML-% IV SOLN
INTRAVENOUS | Status: DC | PRN
  Administered 2019-08-19: 500 mL

## 2019-08-19 MED ORDER — CLOPIDOGREL BISULFATE 75 MG PO TABS: 75.0000 mg | ORAL_TABLET | Freq: Every day | ORAL | Status: DC

## 2019-08-19 MED ORDER — SODIUM CHLORIDE 0.9% FLUSH: 3.0000 mL | INTRAVENOUS | Status: DC | PRN

## 2019-08-19 MED ORDER — CLOPIDOGREL BISULFATE 300 MG PO TABS
ORAL_TABLET | ORAL | Status: DC | PRN
  Administered 2019-08-19: 300 mg via ORAL

## 2019-08-19 MED ORDER — CLOPIDOGREL BISULFATE 75 MG PO TABS
75.0000 mg | ORAL_TABLET | Freq: Every day | ORAL | 11 refills | Status: DC
Start: 2019-08-19 — End: 2019-12-24

## 2019-08-19 MED ORDER — HYDRALAZINE HCL 20 MG/ML IJ SOLN: 5.0000 mg | INTRAMUSCULAR | Status: DC | PRN

## 2019-08-19 MED ORDER — ACETAMINOPHEN 325 MG PO TABS: 650.0000 mg | ORAL_TABLET | ORAL | Status: DC | PRN

## 2019-08-19 MED ORDER — HYDRALAZINE HCL 20 MG/ML IJ SOLN
INTRAMUSCULAR | Status: AC
  Filled 2019-08-19: qty 1

## 2019-08-19 MED ORDER — HEPARIN SODIUM (PORCINE) 1000 UNIT/ML IJ SOLN
INTRAMUSCULAR | Status: DC | PRN
  Administered 2019-08-19: 8000 [IU] via INTRAVENOUS

## 2019-08-19 MED ORDER — ONDANSETRON HCL 4 MG/2ML IJ SOLN: 4.0000 mg | Freq: Four times a day (QID) | INTRAMUSCULAR | Status: DC | PRN

## 2019-08-19 MED ORDER — HEPARIN SODIUM (PORCINE) 1000 UNIT/ML IJ SOLN
INTRAMUSCULAR | Status: AC
  Filled 2019-08-19: qty 1

## 2019-08-19 MED ORDER — CLOPIDOGREL BISULFATE 300 MG PO TABS
ORAL_TABLET | ORAL | Status: AC
  Filled 2019-08-19: qty 1

## 2019-08-19 MED ORDER — SODIUM CHLORIDE 0.9 % IV SOLN: INTRAVENOUS | Status: DC

## 2019-08-19 SURGICAL SUPPLY — 26 items
BALLN JADE .018 3.0 X 150 (BALLOONS) ×2
BALLOON JADE .018 3.0 X 150 (BALLOONS) IMPLANT
CATH CROSS OVER TEMPO 5F (CATHETERS) ×1 IMPLANT
CATH CXI SUPP 2.6F 150 ST (CATHETERS) ×1 IMPLANT
CATH NAVICROSS ANGLED 90CM (MICROCATHETER) ×1 IMPLANT
CATH OMNI FLUSH 5F 65CM (CATHETERS) ×1 IMPLANT
CATH QUICKCROSS ANG SELECT (CATHETERS) ×1 IMPLANT
CATH SOFT-VU 4F 65 STRAIGHT (CATHETERS) IMPLANT
CATH SOFT-VU STRAIGHT 4F 65CM (CATHETERS) ×2
CATH STRAIGHT 5FR 65CM (CATHETERS) ×1 IMPLANT
CATH TEMPO AQUA 5F 100CM (CATHETERS) ×1 IMPLANT
DEVICE CLOSURE MYNXGRIP 6/7F (Vascular Products) ×1 IMPLANT
GUIDEWIRE ANGLED .035X150CM (WIRE) ×1 IMPLANT
GUIDEWIRE ZILIENT 6G 018 (WIRE) ×1 IMPLANT
KIT MICROPUNCTURE NIT STIFF (SHEATH) ×1 IMPLANT
KIT PV (KITS) ×2 IMPLANT
SHEATH FLEX ANSEL ANG 6F 45CM (SHEATH) ×1 IMPLANT
SHEATH PINNACLE 5F 10CM (SHEATH) ×1 IMPLANT
SHEATH PINNACLE 6F 10CM (SHEATH) ×1 IMPLANT
SHEATH PROBE COVER 6X72 (BAG) ×1 IMPLANT
SYR MEDRAD MARK V 150ML (SYRINGE) ×1 IMPLANT
TRANSDUCER W/STOPCOCK (MISCELLANEOUS) ×2 IMPLANT
TRAY PV CATH (CUSTOM PROCEDURE TRAY) ×2 IMPLANT
WIRE BENTSON .035X145CM (WIRE) ×1 IMPLANT
WIRE HI TORQ VERSACORE J 260CM (WIRE) ×1 IMPLANT
WIRE ROSEN-J .035X180CM (WIRE) ×1 IMPLANT

## 2019-08-19 NOTE — H&P (Signed)
History and Physical Interval Note:  08/19/2019 10:58 AM  Anthony Dunlap  has presented today for surgery, with the diagnosis of Critical limb ischemia.  The various methods of treatment have been discussed with the patient and family. After consideration of risks, benefits and other options for treatment, the patient has consented to  Procedure(s): ABDOMINAL AORTOGRAM W/LOWER EXTREMITY (Left) as a surgical intervention.  The patient's history has been reviewed, patient examined, no change in status, stable for surgery.  I have reviewed the patient's chart and labs.  Questions were answered to the patient's satisfaction.    I do not think the patient has any revascularization options in the right lower extremity.  I recommended a right below-knee amputation.  Staged intervention on the left leg today.    Marty Heck  Patient name: Anthony Dunlap  MRN: [id number]        DOB: Oct 05, 1933          Sex: male  REASON FOR CONSULT: Bilateral heel wounds and evaluate for PAD  HPI: Anthony Dunlap is a 86 y.o. male, with history of prostate cancer, diabetes that presents for evaluation of bilateral heel wounds.  Patient initially underwent total hip replacement and was hospitalized from 06/12/19-06/16/19 and then discharged to the [facility].  Apparently while at the [facility] he developed these bilateral heel wounds in May of this year.  He states ultimately he has since been discharged and is back home and living with his wife.  He is ambulatory.  He was evaluated by [name] at the wound center and got noninvasive imaging and then referred to vascular surgery.  Apparently no debridement has been done in the wound care center and they are awaiting further evaluation by vascular surgery.  ABIs from 08/05/2019 showed ABI of 0.78 on the right with a toe pressure of 0 and ABI of 0.97 on the left.  Lower extremity arterial duplex showed tibial disease with right AT and left PT occluded.      Past  Medical History:  Diagnosis Date  . 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 Diabetes ([place])   . Drug-induced hepatitis 06/05/2018  . Inguinal hernia    06/09/2019: per patient " a long time ago on the right side"  . Left eye pain   . Localized osteoarthrosis of right hip 01/05/2019  . Prostate cancer [facility])          Past Surgical History:  Procedure Laterality Date  . HERNIA REPAIR    . IR RADIOLOGIST EVAL & MGMT  03/06/2018  . PROSTATE SURGERY    . TOTAL HIP ARTHROPLASTY Right 06/12/2019   Procedure: RIGHT TOTAL HIP ARTHROPLASTY DIRECT ANTERIOR;  Surgeon: Marybelle Killings, MD;  Location: [facility];  Service: Orthopedics;  Laterality: Right;         Family History  Problem Relation Age of Onset  . Pneumonia Father   . Cancer Sister     SOCIAL HISTORY: Social History        Socioeconomic History  . Marital status: Married    Spouse name: [name]  . Number of children: 3  . Years of education: 9th  . Highest education level: Not on file  Occupational History    Employer: RETIRED    Comment: Retired  Tobacco Use  . Smoking status: Never Smoker  . Smokeless tobacco: Never Used  Vaping Use  . Vaping Use: Never used  Substance and Sexual Activity  . Alcohol  use: No  . Drug use: No  . Sexual activity: Not on file  Other Topics Concern  . Not on file  Social History Narrative   Patient lives at home with his wife. [name]) . Patient is retired.   Education 9th grade.   Right handed.   Caffeine None   Social Determinants of Health      Financial Resource Strain:   . Difficulty of Paying Living Expenses:   Food Insecurity:   . Worried About Charity fundraiser in the Last Year:   . Arboriculturist in the Last Year:   Transportation Needs:   . Film/video editor (Medical):   [name] Lack of Transportation (Non-Medical):   Physical Activity:   . Days of Exercise per Week:   . Minutes of Exercise per  Session:   Stress:   . Feeling of Stress :   Social Connections:   . Frequency of Communication with Friends and Family:   . Frequency of Social Gatherings with Friends and Family:   . Attends Religious Services:   . Active Member of Clubs or Organizations:   . Attends Archivist Meetings:   [name] Marital Status:   Intimate Partner Violence:   . Fear of Current or Ex-Partner:   . Emotionally Abused:   [name] Physically Abused:   . Sexually Abused:          Allergies  Allergen Reactions  . Fluconazole Other (See Comments)    Drug-induced hepatitis          Current Outpatient Medications  Medication Sig Dispense Refill  . ALPRAZolam (XANAX) 0.5 MG tablet Take 0.5 mg by mouth 3 (three) times daily as needed.    [name] amitriptyline (ELAVIL) 10 MG tablet Take 20 mg by mouth at bedtime.     [name] amoxicillin-clavulanate (AUGMENTIN) 875-125 MG tablet TAKE 1 TABLET BY MOUTH TWICE DAILY 60 tablet 11  . aspirin EC 325 MG EC tablet Take 1 tablet (325 mg total) by mouth daily with breakfast. Must take at least 4 weeks postop for DVT prophylaxis 30 tablet 0  . colchicine 0.6 MG tablet     . ferrous sulfate 325 (65 FE) MG tablet Take 1 tablet (325 mg total) by mouth daily with breakfast. (Patient taking differently: Take 325 mg by mouth every evening. )  3  . furosemide (LASIX) 20 MG tablet Take 20 mg by mouth every morning.     [name] glimepiride (AMARYL) 4 MG tablet Take 4 mg by mouth every morning.     [name] HYDROcodone-acetaminophen (NORCO/VICODIN) 5-325 MG tablet Take 1 tablet by mouth every 6 (six) hours as needed for moderate pain. 30 tablet 0  . insulin aspart (NOVOLOG) 100 UNIT/ML injection Inject 0-15 Units into the skin 3 (three) times daily with meals. Sliding scale insulin Less than 70 initiate hypoglycemia protocol 70-120  0 units 120-150 2 unit 151-200 3 units 201-250 3 units 251-300 5 units 301-350 8 units 351-400 11 units  Greater than 400 15 units , call MD (Patient  taking differently: Inject 0-15 Units into the skin See admin instructions. Sliding scale insulin as needed for high blood sugars Less than 70 initiate hypoglycemia protocol 70-120  1 units 120-150 3 unit 151-200 4 units 201-250 5 units 251-300 6 units 301-350 9units 351-400 12 units  Greater than 400 15 units , call MD) 10 mL 11  . levothyroxine (SYNTHROID, LEVOTHROID) 100 MCG tablet Take 100 mcg by mouth every morning.     [name]  meloxicam (MOBIC) 7.5 MG tablet Take 7.5 mg by mouth 2 (two) times daily.    [name] nystatin (MYCOSTATIN/NYSTOP) powder Apply topically 4 (four) times daily. 60 g 8  . pravastatin (PRAVACHOL) 10 MG tablet Take 10 mg by mouth every evening.     . primidone (MYSOLINE) 50 MG tablet Take 100 mg by mouth at bedtime.     Willeen Niece 100-33 UNT-MCG/ML SOPN Inject 44 Units as directed every morning.     [name] HYDROcodone-acetaminophen (NORCO) 10-325 MG tablet Take 1 tablet by mouth every 6 (six) hours as needed. (Patient not taking: Reported on 08/11/2019) 50 tablet 0   No current facility-administered medications for this visit.    REVIEW OF SYSTEMS:  [X]  denotes positive finding, [ ]  denotes negative finding Cardiac  Comments:  Chest pain or chest pressure:    Shortness of breath upon exertion:    Short of breath when lying flat:    Irregular heart rhythm:        Vascular    Pain in calf, thigh, or hip brought on by ambulation:    Pain in feet at night that wakes you up from your sleep:     Blood clot in your veins:    Leg swelling:         Pulmonary    Oxygen at home:    Productive cough:     Wheezing:         Neurologic    Sudden weakness in arms or legs:     Sudden numbness in arms or legs:     Sudden onset of difficulty speaking or slurred speech:    Temporary loss of vision in one eye:     Problems with dizziness:         Gastrointestinal    Blood in stool:     Vomited blood:          Genitourinary    Burning when urinating:     Blood in urine:        Psychiatric    Major depression:         Hematologic    Bleeding problems:    Problems with blood clotting too easily:        Skin    Rashes or ulcers:        Constitutional    Fever or chills:      PHYSICAL EXAM:    Vitals:   08/11/19 1507  BP: (!) 155/86  Pulse: 62  Resp: 18  Temp: (!) 97.2 F (36.2 C)  TempSrc: Temporal  SpO2: 100%  Weight: 171 lb (77.6 kg)  Height: 5\' 8"  (1.727 m)    GENERAL: The patient is a well-nourished male, in no acute distress. The vital signs are documented above. CARDIAC: There is a regular rate and rhythm.  VASCULAR:  Palpable femoral pulses bilaterally No right pedal pulses palpable Right heel wound and right fifth metatarsal wound as pictured below Left dorsalis pedis palpable Left heel wound as pictured below PULMONARY: There is good air exchange bilaterally without wheezing or rales. ABDOMEN: Soft and non-tender with normal pitched bowel sounds.  MUSCULOSKELETAL: There are no major deformities or cyanosis. NEUROLOGIC: No focal weakness or paresthesias are detected. SKIN: Bilateral heel ulcers - extensive - right 5th metatarsal ulcer PSYCHIATRIC: The patient has a normal affect.         DATA:   I independently reviewed his ABIs which are 0.78 on the right with a triphasic waveform but a toe pressure  of 0 and 0.97 on the left with a triphasic waveform  Assessment/Plan:  86 year old male presents with extensive bilateral lower extremity tissue loss as pictured above with bilateral heel wounds and a right 5th metatarsal ulcer.  Discussed with him and his caregiver that these appear to be pressure ulcerations from his prolonged stay at the [facility].  Fortunately he is ambulatory and has been discharged home and I think there is certainly a role for limb salvage.  I discussed with him and his caregiver  that the heel wounds are going to be extremely difficult to heal even with revascularization given the extent of tissue loss and high risk for limb loss.  I think he merits at least a lower extremity arteriogram given diminished ABI of 0.78 on the right with a toe pressure of 0 and I cannot palpate a pedal pulse on the right.  On the left I can appreciate a dorsalis pedis and he has a normal ABI and will get run-off on left as well to ensure he is optimized for wound care.  Risk and benefits were discussed with him in detail.  We will schedule for tomorrow.  Will allow the wound center with [name] to continue to take care of him and appreciate their assistance.   Marty Heck, MD [facility] [place] Office: [phone]

## 2019-08-19 NOTE — Discharge Instructions (Signed)
Plavix prescription sent to pharmacy.  Continue aspirin and Plavix daily.  Will arrange follow-up in 1 month.  Revascularization of left leg today.  Patient is not interested in right BKA at this time.  Call our office if change mind.       [facility]  Discharge Instructions  Lower Extremity Angiogram; Angioplasty/Stenting  Please refer to the following instructions for your post-procedure care. Your surgeon or physician assistant will discuss any changes with you.  Activity  Avoid lifting more than 8 pounds (1 gallons of milk) for 72 hours (3 days) after your procedure. You may walk as much as you can tolerate. It's OK to drive after 72 hours.  Bathing/Showering  You may shower the day after your procedure. If you have a bandage, you may remove it at 24- 48 hours. Clean your incision site with mild soap and water. Pat the area dry with a clean towel.  Diet  Resume your pre-procedure diet. There are no special food restrictions following this procedure. All patients with peripheral vascular disease should follow a low fat/low cholesterol diet. In order to heal from your surgery, it is CRITICAL to get adequate nutrition. Your body requires vitamins, minerals, and protein. Vegetables are the best source of vitamins and minerals. Vegetables also provide the perfect balance of protein. Processed food has little nutritional value, so try to avoid this.  Medications  Resume taking all of your medications unless your doctor tells you not to. If your incision is causing pain, you may take over-the-counter pain relievers such as acetaminophen (Tylenol)  Follow Up  Follow up will be arranged at the time of your procedure. You may have an office visit scheduled or may be scheduled for surgery. Ask your surgeon if you have any questions.  Please call us immediately for any of the following conditions: .Severe or worsening pain your legs or feet at rest or with  walking. .Increased pain, redness, drainage at your groin puncture site. .Fever of 101 degrees or higher. .If you have any mild or slow bleeding from your puncture site: lie down, apply firm constant pressure over the area with a piece of gauze or a clean wash cloth for 30 minutes- no peeking!, call 911 right away if you are still bleeding after 30 minutes, or if the bleeding is heavy and unmanageable.  Reduce your risk factors of vascular disease:  Stop smoking. If you would like help call [facility] at 1-800-QUIT-NOW 2200861793) or [place] at [phone]. Manage your cholesterol Maintain a desired weight Control your diabetes Keep your blood pressure down  If you have any questions, please call the office at [phone]

## 2019-08-19 NOTE — Op Note (Signed)
Patient name: Anthony Dunlap MRN: [id number] DOB: 11-24-1933 Sex: male  08/19/2019 Pre-operative Diagnosis: Critical limb ischemia of the left lower extremity with tissue loss (bilateral heel wounds) Post-operative diagnosis:  Same Surgeon:  Marty Heck, MD Procedure Performed: 1.  Ultrasound-guided access of the right common femoral artery 2.  Left lower extremity arteriogram with selection of third order branches 3.  Left anterior tibial artery angioplasty (3 mm x 150 mm jade balloon) 4.  Unsuccessful antegrade recanalization of peroneal artery 5.  Mynx closure of right common femoral artery  Indications: Patient is a 86 year old male who presented for evaluation of bilateral heel wounds and a right fifth metatarsal wound in clinic.  Last week he underwent attempts at right lower extremity revascularization and but unfortunately had no options and I recommended a below-knee amputation in the future when he decides to proceed.  He presents today for staged intervention of the left leg after risk benefits discussed given severe tibial disease noted on arteriogram pictures last week.  Findings:   Aortogram was deferred given that this was performed last week.  Imaging of the left common femoral SFA profunda was also deferred given it was performed last week.    Dedicated imaging of the left lower extremity shows single-vessel runoff via anterior tibial artery that had multiple high-grade focal stenosis greater than 75% in the proximal one third of the artery with a fairly robust vessel distally.  The tibioperoneal trunk had a high-grade stenosis in it.  Posterior tibial artery was occluded.  The peroneal artery was occluded proximally and did reconstitute in the mid to distal calf.  Ultimately the anterior tibial artery was selected and balloon angioplasty was performed with a 3 mm Jade balloon throughout the proximal to mid segment with no evidence of residual stenosis and no  dissection and excellent flow in the foot that was fairly brisk.  I did attempt to recannulate the peroneal artery antegrade given a proximal occlusion that was unsuccessful.  Palpable left DP pulse at completion.   Procedure:  The patient was identified in the holding area and taken to room 8.  The patient was then placed supine on the table and prepped and draped in the usual sterile fashion.  A time out was called.  Ultrasound was used to evaluate the right common femoral artery.  It was patent .  A digital ultrasound image was acquired.  A micropuncture needle was used to access the right common femoral artery under ultrasound guidance.  An 018 wire was advanced without resistance and a micropuncture sheath was placed.  The 018 wire was removed and a benson wire was placed.  The micropuncture sheath was exchanged for a 5 french sheath.  An omniflush catheter was advanced over the wire to the level of L-1.  Abdominal aortogram was deferred given it was performed last week.  Initially used a Bentson wire with a Omni Flush catheter to try and cross the aortic bifurcation.  Unfortunately this was very steep and I could not get any catheter to track.  I tried a crossover catheter as well as a straight 5 [place] and 4 French flush catheter even when I got a soft angled Glidewire down into the left SFA.  Finally I was able to get a soft angled glide wire into the left SFA and used a glide catheter over the aortic bifurcation down into the SFA.  Through this I did hand-injection is to image the tibial vessels given evidence of tibial  disease last week.  This identified single vessel runoff via anterior tibial artery with multiple high-grade stenosis proximally and we elected to treat this.  I then used a versa core wire over the aortic arch down to the left SFA through the glide cath and then remove this and placed a 6 French Ansell sheath over the aortic bifurcation into the left SFA.  Patient was given 100 units/kg  heparin.  We then used our glide cath and ultimately exchanged for a 6 g zilient wire with a quick cross select catheter and ultimately successfully cannulated the anterior tibial and got my wire down to the distal portion of the artery.  Selected a 3 mm x 150 mm jade angioplasty balloon and all the lesions in the proximal mid anterior tibial artery were angioplastied nominal pressure for 2 minutes.  Final injection showed excellent flow down the anterior tibial with no residual stenosis no dissection.  Given that he has significant tissue loss I thought it was at least worth attempting to cannulate the peroneal given the proximal peroneal was out but it did appear to reconstitute in the mid to distal calf.  I used a CXI straight catheter with a Zilient wire and selected the peroneal proximally but ultimately given a flush occlusion the wire kept wanting to track out collaterals and ultimately I was outside the vessel.  I elected to stop and wires and catheters were removed.  Exchanged for a short 6 French sheath in the right groin and a menx closure device was deployed to be taken to PACU in stable condition.   Plan: Patient is not interested in right BKA at this time given no options for revascularization in right leg.  He is going to wound clinic here in [place].  I recommend a follow-up in 1 month with left leg arterial duplex and ABIs given we were able to treat left leg today.  Discussed that he contact me if he develops any signs of infection in the wounds or worsening rest pain and changes his mind about proceeding with amputation in the right leg.  Marty Heck, MD [facility] [place] Office: [phone]

## 2019-08-20 ENCOUNTER — Ambulatory Visit: Payer: Medicare Other | Admitting: Infectious Disease

## 2019-08-20 ENCOUNTER — Encounter (HOSPITAL_COMMUNITY): Payer: Self-pay | Admitting: Vascular Surgery

## 2019-08-20 ENCOUNTER — Encounter (HOSPITAL_BASED_OUTPATIENT_CLINIC_OR_DEPARTMENT_OTHER): Payer: Medicare Other | Attending: Internal Medicine | Admitting: Internal Medicine

## 2019-08-20 VITALS — BP 131/82 | HR 109 | Temp 98.4°F | Wt 174.0 lb

## 2019-08-20 DIAGNOSIS — L89623 Pressure ulcer of left heel, stage 3: Secondary | ICD-10-CM | POA: Insufficient documentation

## 2019-08-20 DIAGNOSIS — Z96641 Presence of right artificial hip joint: Secondary | ICD-10-CM

## 2019-08-20 DIAGNOSIS — Z8546 Personal history of malignant neoplasm of prostate: Secondary | ICD-10-CM | POA: Diagnosis not present

## 2019-08-20 DIAGNOSIS — E1142 Type 2 diabetes mellitus with diabetic polyneuropathy: Secondary | ICD-10-CM | POA: Diagnosis not present

## 2019-08-20 DIAGNOSIS — E11621 Type 2 diabetes mellitus with foot ulcer: Secondary | ICD-10-CM | POA: Insufficient documentation

## 2019-08-20 DIAGNOSIS — L89610 Pressure ulcer of right heel, unstageable: Secondary | ICD-10-CM | POA: Diagnosis not present

## 2019-08-20 DIAGNOSIS — Z794 Long term (current) use of insulin: Secondary | ICD-10-CM

## 2019-08-20 DIAGNOSIS — B952 Enterococcus as the cause of diseases classified elsewhere: Secondary | ICD-10-CM

## 2019-08-20 DIAGNOSIS — I779 Disorder of arteries and arterioles, unspecified: Secondary | ICD-10-CM | POA: Insufficient documentation

## 2019-08-20 DIAGNOSIS — K6812 Psoas muscle abscess: Secondary | ICD-10-CM

## 2019-08-20 DIAGNOSIS — K716 Toxic liver disease with hepatitis, not elsewhere classified: Secondary | ICD-10-CM

## 2019-08-20 DIAGNOSIS — L0291 Cutaneous abscess, unspecified: Secondary | ICD-10-CM

## 2019-08-20 DIAGNOSIS — E1151 Type 2 diabetes mellitus with diabetic peripheral angiopathy without gangrene: Secondary | ICD-10-CM | POA: Insufficient documentation

## 2019-08-20 DIAGNOSIS — E1165 Type 2 diabetes mellitus with hyperglycemia: Secondary | ICD-10-CM

## 2019-08-20 DIAGNOSIS — I70229 Atherosclerosis of native arteries of extremities with rest pain, unspecified extremity: Secondary | ICD-10-CM

## 2019-08-20 DIAGNOSIS — M1990 Unspecified osteoarthritis, unspecified site: Secondary | ICD-10-CM | POA: Diagnosis not present

## 2019-08-20 DIAGNOSIS — E109 Type 1 diabetes mellitus without complications: Secondary | ICD-10-CM

## 2019-08-20 DIAGNOSIS — A414 Sepsis due to anaerobes: Secondary | ICD-10-CM

## 2019-08-20 DIAGNOSIS — M86659 Other chronic osteomyelitis, unspecified thigh: Secondary | ICD-10-CM | POA: Diagnosis not present

## 2019-08-20 DIAGNOSIS — I998 Other disorder of circulatory system: Secondary | ICD-10-CM

## 2019-08-20 DIAGNOSIS — I10 Essential (primary) hypertension: Secondary | ICD-10-CM | POA: Diagnosis not present

## 2019-08-20 DIAGNOSIS — B49 Unspecified mycosis: Secondary | ICD-10-CM

## 2019-08-20 DIAGNOSIS — M1611 Unilateral primary osteoarthritis, right hip: Secondary | ICD-10-CM

## 2019-08-20 DIAGNOSIS — K651 Peritoneal abscess: Secondary | ICD-10-CM

## 2019-08-20 DIAGNOSIS — L97518 Non-pressure chronic ulcer of other part of right foot with other specified severity: Secondary | ICD-10-CM | POA: Diagnosis not present

## 2019-08-20 MED FILL — Hydralazine HCl Inj 20 MG/ML: INTRAMUSCULAR | Qty: 1 | Status: AC

## 2019-08-20 NOTE — Progress Notes (Signed)
Subjective:    Patient ID: Anthony Dunlap, male    DOB: 23-Nov-1933, 86 y.o.   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 He is currently residing in a skilled nursing facility and on Zosyn rather than Unasyn. I do not know why this change was made as his microbes were covered by the Unasyn. In any case the abscesses have resolved radiographically and the drains been pulled by interventional radiology.  He did well since I last saw him in March 2020, but then was admitted to the hospital with severe candidal infection with intertrigo, and diarrhea with the catheter in place and candidemia.  He was treated with fluconazole but then developed hepatitis and was changed over to Eraxis and completed a two-week course.     when I conducted a telephone visit with him on June 15 of 2020 he was experiencing worsening right-sided hip pain with ambulation and with times different positions in his bed.  I saw him in person in late June and his inflammatory markers were elevated I then ordered an MRI on July 16th showed:  IMPRESSION: Osteomyelitis about the symphysis pubis demonstrates some improvement, particularly in the superior pubic rami. There has been only minimal improvement in signal abnormality in the inferior pubic rami.  Tiny fluid collection in the symphysis pubis is new since the prior examination worrisome for abscess.  He had no change in severe right and moderately severe left hip osteoarthritis.  I I arranged for him to be admitted to the hospital directly  from clinic last visit.  Interventional radiology able to aspirate the pubic symphysis with 1 mL of bloody fluid being recovered for culture but no organisms grew.  He is placed back on intravenous Unasyn.  Unfortunately taking the Unasyn at home became too burdensome for the son because of it being multiple times per day.  He was  switched over to IV daptomycin x 2 weeks then with addition of ceftriaxone 2 g daily and metronidazole 500 mg 3 times daily to cover gram negatives and anaerobes.  Since then he was switched over to Augmentin and continued on that.  He continued to have some pain in his pubic region but also more so in the right hip where he has had severe osteoarthritis.  We got an MRI of the hip and pelvis in December 2020 which showed:   IMPRESSION: 1. Overall improved appearance of the pubic bone marrow edema and enhancement. No evidence of progressive osteomyelitis. 2. Stable small peripherally enhancing fluid collection at the symphysis pubis. 3. No new or enlarging fluid collections. The soft tissue inflammatory changes in the inferior pelvis have improved. 4. Stable severe right hip osteoarthritis.   He has been seen by Joni Fears with orthopedic surgery who completely destroyed.  Since I last saw him he has had successful right hip THA and feels dramatically better.   Unfortunately he has developed critical limb ischemia bilateral lower extremities, sp angioplasty to let anttioer tibial artery by [facility] on June 30th.. Right leg has no options for revascularization. He was offered BKA to solve this  problem but he I snot ready  For that.  He continues on augmentin.   Past Medical History:  Diagnosis Date  . 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 Diabetes ([place])   . Drug-induced hepatitis 06/05/2018  . Inguinal hernia    06/09/2019: per patient " a long time ago on the right side"  . Left eye pain   . Localized osteoarthrosis  of right hip 01/05/2019  . Prostate cancer [facility])     Past Surgical History:  Procedure Laterality Date  . ABDOMINAL AORTOGRAM W/LOWER EXTREMITY Bilateral 08/12/2019   Procedure: ABDOMINAL AORTOGRAM W/LOWER EXTREMITY;  Surgeon: Marty Heck, MD;  Location: [facility];  Service: Cardiovascular;  Laterality: Bilateral;  . ABDOMINAL AORTOGRAM W/LOWER EXTREMITY Left 08/19/2019   Procedure: ABDOMINAL AORTOGRAM W/LOWER EXTREMITY;  Surgeon: Marty Heck, MD;  Location: [facility];  Service: Cardiovascular;  Laterality: Left;  . HERNIA REPAIR    . IR RADIOLOGIST EVAL & MGMT  03/06/2018  . PERIPHERAL VASCULAR BALLOON ANGIOPLASTY Left 08/19/2019   Procedure: PERIPHERAL VASCULAR BALLOON ANGIOPLASTY;  Surgeon: Marty Heck, MD;  Location: [facility];  Service: Cardiovascular;  Laterality: Left;  . PROSTATE SURGERY    . TOTAL HIP ARTHROPLASTY Right 06/12/2019   Procedure: RIGHT TOTAL HIP ARTHROPLASTY DIRECT ANTERIOR;  Surgeon: Marybelle Killings, MD;  Location: [place];  Service: Orthopedics;  Laterality: Right;    Family History  Problem Relation Age of Onset  . Pneumonia Father   . Cancer Sister       Social History   Socioeconomic History  . Marital status: Married    Spouse name: [name]  . Number of children: 3  . Years of education: 9th  . Highest education level: Not on file  Occupational History    Employer: RETIRED    Comment: Retired  Tobacco Use  . Smoking status: Never Smoker  . Smokeless tobacco: Never Used  Vaping Use  . Vaping Use: Never used  Substance and Sexual Activity  . Alcohol use: No  . Drug use: No  . Sexual activity: Not on file  Other Topics Concern  . Not on file  Social History Narrative   Patient lives at home with his wife. [name]) . Patient is retired.   Education 9th grade.   Right handed.   Caffeine None   Social Determinants of Health   Financial Resource Strain:   . Difficulty of Paying Living  Expenses:   Food Insecurity:   . Worried About Charity fundraiser in the Last Year:   . Arboriculturist in the Last Year:   Transportation Needs:   . Film/video editor (Medical):   [name] Lack of Transportation (Non-Medical):   Physical Activity:   . Days of Exercise per Week:   . Minutes of Exercise per Session:   Stress:   . Feeling of Stress :   Social Connections:   . Frequency of Communication with Friends and Family:   . Frequency of Social Gatherings with Friends and Family:   . Attends Religious Services:   . Active Member of Clubs or Organizations:   . Attends Archivist Meetings:   [name] Marital Status:     Allergies  Allergen Reactions  . Fluconazole Other (See Comments)    Drug-induced hepatitis     Current Outpatient Medications:  .  acetaminophen (TYLENOL) 500 MG tablet, Take 1,000 mg by mouth every 6 (six) hours  as needed for mild pain or moderate pain., Disp: , Rfl:  .  ALPRAZolam (XANAX) 0.5 MG tablet, Take 0.5 mg by mouth 2 (two) times daily. , Disp: , Rfl:  .  amitriptyline (ELAVIL) 10 MG tablet, Take 20 mg by mouth at bedtime. , Disp: , Rfl:  .  amoxicillin-clavulanate (AUGMENTIN) 875-125 MG tablet, TAKE 1 TABLET BY MOUTH TWICE DAILY (Patient taking differently: Take 1 tablet by mouth 2 (two) times daily. ), Disp: 60 tablet, Rfl: 11 .  aspirin EC 81 MG tablet, Take 81 mg by mouth daily. Swallow whole., Disp: , Rfl:  .  clopidogrel (PLAVIX) 75 MG tablet, Take 1 tablet (75 mg total) by mouth daily., Disp: 30 tablet, Rfl: 11 .  colchicine 0.6 MG tablet, Take 0.6 mg by mouth daily as needed (Gout). , Disp: , Rfl:  .  ferrous sulfate 325 (65 FE) MG tablet, Take 1 tablet (325 mg total) by mouth daily with breakfast. (Patient taking differently: Take 325 mg by mouth daily. ), Disp: , Rfl: 3 .  furosemide (LASIX) 20 MG tablet, Take 20 mg by mouth daily. , Disp: , Rfl:  .  glimepiride (AMARYL) 4 MG tablet, Take 4 mg by mouth daily. , Disp: , Rfl:  .  insulin  aspart (NOVOLOG) 100 UNIT/ML injection, Inject 0-15 Units into the skin 3 (three) times daily with meals. Sliding scale insulin Less than 70 initiate hypoglycemia protocol 70-120  0 units 120-150 2 unit 151-200 3 units 201-250 3 units 251-300 5 units 301-350 8 units 351-400 11 units  Greater than 400 15 units , call MD (Patient taking differently: Inject 0-15 Units into the skin See admin instructions. Sliding scale insulin as needed for high blood sugars Less than 70 initiate hypoglycemia protocol 70-120  0 units 120-150 3 unit 151-200 4 units 201-250 4 units 251-300 6 units 301-350 9 units 351-400 12 units  Greater than 400 16 units , call MD), Disp: 10 mL, Rfl: 11 .  levothyroxine (SYNTHROID, LEVOTHROID) 100 MCG tablet, Take 100 mcg by mouth every morning. , Disp: , Rfl:  .  meloxicam (MOBIC) 7.5 MG tablet, Take 7.5 mg by mouth 2 (two) times daily., Disp: , Rfl:  .  nystatin (MYCOSTATIN/NYSTOP) powder, Apply topically 4 (four) times daily. (Patient taking differently: Apply 1 application topically daily as needed (yeast). ), Disp: 60 g, Rfl: 8 .  pravastatin (PRAVACHOL) 10 MG tablet, Take 10 mg by mouth every evening. , Disp: , Rfl:  .  primidone (MYSOLINE) 50 MG tablet, Take 150 mg by mouth at bedtime. , Disp: , Rfl:  .  SANTYL ointment, Apply 1 application topically 3 (three) times a week. , Disp: , Rfl:  .  SOLIQUA 100-33 UNT-MCG/ML SOPN, Inject 45 Units as directed every morning. , Disp: , Rfl:    Review of Systems  Constitutional: Negative for activity change, appetite change, chills, diaphoresis, fatigue, fever and unexpected weight change.  HENT: Negative for congestion, dental problem, postnasal drip, rhinorrhea, sinus pressure, sneezing, sore throat and trouble swallowing.   Eyes: Negative for photophobia and visual disturbance.  Respiratory: Negative for cough, chest tightness, shortness of breath, wheezing and stridor.   Cardiovascular: Negative for chest pain, palpitations and leg  swelling.  Gastrointestinal: Negative for abdominal distention, abdominal pain, anal bleeding, blood in stool, constipation, diarrhea, nausea and vomiting.  Genitourinary: Negative for difficulty urinating, dysuria, flank pain and hematuria.  Musculoskeletal: Negative for arthralgias, back pain, gait problem and joint swelling.  Skin: Positive for color change  and wound. Negative for pallor and rash.  Neurological: Negative for dizziness, tremors, weakness and light-headedness.  Hematological: Negative for adenopathy. Does not bruise/bleed easily.  Psychiatric/Behavioral: Negative for agitation, behavioral problems, confusion, decreased concentration, dysphoric mood and sleep disturbance.       Objective:   Physical Exam Constitutional:      General: He is not in acute distress.    Appearance: Normal appearance. He is well-developed. He is not ill-appearing or diaphoretic.  HENT:     Head: Normocephalic and atraumatic.     Right Ear: Hearing and external ear normal.     Left Ear: Hearing and external ear normal.     Nose: No nasal deformity or rhinorrhea.  Eyes:     General: No scleral icterus.    Conjunctiva/sclera: Conjunctivae normal.     Right eye: Right conjunctiva is not injected.     Left eye: Left conjunctiva is not injected.  Neck:     Vascular: No JVD.  Cardiovascular:     Rate and Rhythm: Regular rhythm. Tachycardia present.     Heart sounds: S1 normal and S2 normal.  Pulmonary:     Effort: Pulmonary effort is normal. No respiratory distress.     Breath sounds: No wheezing.  Abdominal:     General: There is no distension.     Palpations: Abdomen is soft.  Musculoskeletal:     Right shoulder: Normal.     Left shoulder: Normal.     Cervical back: Normal range of motion and neck supple.     Right hip: Decreased range of motion.     Left hip: Decreased range of motion.     Right knee: Normal.     Left knee: Normal.  Lymphadenopathy:     Head:     Right side of  head: No submandibular, preauricular or posterior auricular adenopathy.     Left side of head: No submandibular, preauricular or posterior auricular adenopathy.     Cervical: No cervical adenopathy.     Right cervical: No superficial or deep cervical adenopathy.    Left cervical: No superficial or deep cervical adenopathy.  Skin:    General: Skin is warm and dry.     Coloration: Skin is not pale.     Findings: No abrasion, bruising, ecchymosis, erythema, lesion or rash.     Nails: There is no clubbing.  Neurological:     General: No focal deficit present.     Mental Status: He is alert and oriented to person, place, and time. Mental status is at baseline.     Sensory: No sensory deficit.     Coordination: Coordination normal.     Gait: Gait normal.  Psychiatric:        Attention and Perception: He is attentive.        Mood and Affect: Mood normal.        Speech: Speech is delayed.        Behavior: Behavior normal. Behavior is cooperative.        Cognition and Memory: Cognition normal.        Judgment: Judgment normal.    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  Right hip severe osteoarthritis: he is dramatically better after THA  PVD with ulcers, limb ischemia. I worry about infection given lack of blood flow here. I worry he will need BKA ultimately.

## 2019-08-21 LAB — BASIC METABOLIC PANEL WITH GFR
BUN/Creatinine Ratio: 18 (calc) (ref 6–22)
BUN: 22 mg/dL (ref 7–25)
CO2: 26 mmol/L (ref 20–32)
Calcium: 9.1 mg/dL (ref 8.6–10.3)
Chloride: 100 mmol/L (ref 98–110)
Creat: 1.19 mg/dL — ABNORMAL HIGH (ref 0.70–1.11)
GFR, Est African American: 65 mL/min/{1.73_m2} (ref >=60)
GFR, Est Non African American: 56 mL/min/{1.73_m2} — ABNORMAL LOW (ref >=60)
Glucose, Bld: 144 mg/dL — ABNORMAL HIGH (ref 65–99)
Potassium: 4.4 mmol/L (ref 3.5–5.3)
Sodium: 137 mmol/L (ref 135–146)

## 2019-08-21 LAB — HEMOGLOBIN A1C
Hgb A1c MFr Bld: 6.9 % of total Hgb — ABNORMAL HIGH (ref <5.7)
Mean Plasma Glucose: 151 (calc)
eAG (mmol/L): 8.4 (calc)

## 2019-08-21 LAB — C-REACTIVE PROTEIN: CRP: 58.5 mg/L — ABNORMAL HIGH (ref <8.0)

## 2019-08-21 LAB — SEDIMENTATION RATE: Sed Rate: 62 mm/h — ABNORMAL HIGH (ref 0–20)

## 2019-08-21 NOTE — Progress Notes (Signed)
Anthony Dunlap ([id number]) , Visit Report for 08/20/2019 Arrival Information Details Patient Name: Date of Service: Anthony Dunlap, Anthony D. 08/20/2019 1:30 PM Medical Record Number: [id number] Patient Account Number: [id number] Date of Birth/Sex: Treating RN: 08/02/1933 (86 y.o. Anthony Dunlap) Carlene Coria Primary Care : [name] Other Clinician: Referring : Treating /Extender: Garfield Cornea in Treatment: 3 Visit Information History Since Last Visit All ordered tests and consults were completed: No Patient Arrived: [name] Added or deleted any medications: No Arrival Time: 14:21 Any new allergies or adverse reactions: No Accompanied By: self Had a fall or experienced change in No Transfer Assistance: None activities of daily living that may affect Patient Identification Verified: Yes risk of falls: Secondary Verification Process Completed: Yes Signs or symptoms of abuse/neglect since last visito No Patient Has Alerts: No Hospitalized since last visit: No Implantable device outside of the clinic excluding No cellular tissue based products placed in the center since last visit: Has Dressing in Place as Prescribed: Yes Pain Present Now: No Electronic Signature(s) Signed: 08/21/2019 3:42:07 PM By: Carlene Coria RN Entered By: Carlene Coria on 08/20/2019 14:22:58 -------------------------------------------------------------------------------- Encounter Discharge Information Details Patient Name: Date of Service: Anthony Dunlap Anthony D. 08/20/2019 1:30 PM Medical Record Number: [id number] Patient Account Number: [id number] Date of Birth/Sex: Treating RN: 19-Feb-1934 (86 y.o. Anthony Dunlap Primary Care : [name] Other Clinician: Referring : Treating /Extender: Garfield Cornea in Treatment: 3 Encounter Discharge Information Items Post Procedure Vitals Discharge Condition: Stable Temperature (F):  98.4 Ambulatory Status: Walker Pulse (bpm): 105 Discharge Destination: Home Respiratory Rate (breaths/min): 18 Transportation: Private Auto Blood Pressure (mmHg): 149/78 Accompanied By: self Schedule Follow-up Appointment: Yes Clinical Summary of Care: Patient Declined Electronic Signature(s) Signed: 08/21/2019 3:42:07 PM By: Carlene Coria RN Entered By: Carlene Coria on 08/20/2019 15:31:20 -------------------------------------------------------------------------------- Lower Extremity Assessment Details Patient Name: Date of Service: Anthony Dunlap, Anthony Anthony D. 08/20/2019 1:30 PM Medical Record Number: [id number] Patient Account Number: [id number] Date of Birth/Sex: Treating RN: [birth year] (86 y.o. Anthony Dunlap Primary Care : [name] Other Clinician: Referring : Treating /Extender: Garfield Cornea in Treatment: 3 Edema Assessment Assessed: [Left: No] [Right: No] Edema: [Left: No] [Right: No] Calf Left: Right: Point of Measurement: 42 cm From Medial Instep 31 cm 32 cm Ankle Left: Right: Point of Measurement: 10 cm From Medial Instep 21 cm 22 cm Electronic Signature(s) Signed: 08/21/2019 3:42:07 PM By: Carlene Coria RN Entered By: Carlene Coria on 08/20/2019 14:23:52 -------------------------------------------------------------------------------- Multi Wound Chart Details Patient Name: Date of Service: Anthony Dunlap Anthony D. 08/20/2019 1:30 PM Medical Record Number: [id number] Patient Account Number: [id number] Date of Birth/Sex: Treating RN: 27-Dec-1933 (86 y.o. Anthony Dunlap Primary Care : [name] Other Clinician: Referring : Treating /Extender: Garfield Cornea in Treatment: 3 Vital Signs Height(in): 68 Pulse(bpm): 105 Weight(lbs): 172 Blood Pressure(mmHg): 149/78 Body Mass Index(BMI): 26 Temperature(F): 98.4 Respiratory Rate(breaths/min): 18 Photos: [1:No Photos Left  Calcaneus] [2:No Photos Right Calcaneus] [3:No Photos Right T Fifth oe] Wound Location: [1:Gradually Appeared] [2:Gradually Appeared] [3:Gradually Appeared] Wounding Event: [1:Pressure Ulcer] [2:Pressure Ulcer] [3:Diabetic Wound/Ulcer of the Lower] Primary Etiology: [1:Hypertension, Type II Diabetes,] [2:Hypertension, Type II Diabetes,] [3:Extremity Hypertension, Type II Diabetes,] Comorbid History: [1:Osteomyelitis, Received Radiation 06/06/2019] [2:Osteomyelitis, Received Radiation 06/06/2019] [3:Osteomyelitis, Received Radiation 06/06/2019] Date Acquired: [1:3] [2:3] [3:3] Weeks of Treatment: [1:Open] [2:Open] [3:Open] Wound Status: [1:5x4x0.3] [2:6x5x1.2] [3:0.3x0.1x0.1] Measurements L x W x D (cm) [1:15.708] [2:23.562] [3:0.024] A (cm) : rea [1:4.712] [  2:28.274] [3:0.002] Volume (cm) : [1:13.00%] [2:-15.40%] [3:74.50%] % Reduction in A rea: [1:-160.90%] [2:-1284.60%] [3:77.80%] % Reduction in Volume: [1:Category/Stage III] [2:Unstageable/Unclassified] [3:Grade 2] Classification: [1:Large] [2:Large] [3:Small] Exudate A mount: [1:Serosanguineous] [2:Purulent] [3:Serosanguineous] Exudate Type: [1:red, brown] [2:yellow, brown, green] [3:red, brown] Exudate Color: [1:Distinct, outline attached] [2:Distinct, outline attached] [3:Flat and Intact] Wound Margin: [1:None Present (0%)] [2:None Present (0%)] [3:None Present (0%)] Granulation Amount: [1:Large (67-100%)] [2:Large (67-100%)] [3:Large (67-100%)] Necrotic Amount: [1:Eschar, Adherent Slough] [2:Eschar, Adherent Slough] [3:Adherent Slough] Necrotic Tissue: [1:Fat Layer (Subcutaneous Tissue)] [2:Fat Layer (Subcutaneous Tissue)] [3:Fascia: No] Exposed Structures: [1:Exposed: Yes Fascia: No Tendon: No Muscle: No Joint: No Bone: No None] [2:Exposed: Yes Fascia: No Tendon: No Muscle: No Joint: No Bone: No None] [3:Fat Layer (Subcutaneous Tissue) Exposed: No Tendon: No Muscle: No Joint: No Bone: No Small (1-33%)] Epithelialization:  [1:Debridement - Excisional] [2:N/A] [3:N/A] Debridement: Pre-procedure Verification/Time Out 15:03 [2:N/A] [3:N/A] Taken: [1:Other] [2:N/A] [3:N/A] Pain Control: [1:Subcutaneous, Slough] [2:N/A] [3:N/A] Tissue Debrided: [1:Skin/Subcutaneous Tissue] [2:N/A] [3:N/A] Level: [1:20] [2:N/A] [3:N/A] Debridement A (sq cm): [1:rea Blade, Curette, Forceps] [2:N/A] [3:N/A] Instrument: [1:Minimum] [2:N/A] [3:N/A] Bleeding: [1:Pressure] [2:N/A] [3:N/A] Hemostasis A chieved: [1:0] [2:N/A] [3:N/A] Procedural Pain: [1:0] [2:N/A] [3:N/A] Post Procedural Pain: [1:Procedure was tolerated well] [2:N/A] [3:N/A] Debridement Treatment Response: [1:5x4x0.3] [2:N/A] [3:N/A] Post Debridement Measurements L x W x D (cm) [1:4.712] [2:N/A] [3:N/A] Post Debridement Volume: (cm) [1:Category/Stage III] [2:N/A] [3:N/A] Post Debridement Stage: [1:Debridement] [2:N/A] [3:N/A] Wound Number: 4 N/A N/A Photos: No Photos N/A N/A Right, Lateral Foot N/A N/A Wound Location: Gradually Appeared N/A N/A Wounding Event: Diabetic Wound/Ulcer of the Lower N/A N/A Primary Etiology: Extremity Hypertension, Type II Diabetes, N/A N/A Comorbid History: Osteomyelitis, Received Radiation 06/06/2019 N/A N/A Date A cquired: 3 N/A N/A Weeks of Treatment: Open N/A N/A Wound Status: 1.1x1.3x0.3 N/A N/A Measurements L x W x D (cm) 1.123 N/A N/A A (cm) : rea 0.337 N/A N/A Volume (cm) : 0.00% N/A N/A % Reduction in A rea: 24.90% N/A N/A % Reduction in Volume: Grade 2 N/A N/A Classification: Medium N/A N/A Exudate A mount: Serosanguineous N/A N/A Exudate Type: red, brown N/A N/A Exudate Color: Distinct, outline attached N/A N/A Wound Margin: None Present (0%) N/A N/A Granulation A mount: Large (67-100%) N/A N/A Necrotic A mount: Adherent Slough N/A N/A Necrotic Tissue: Fat Layer (Subcutaneous Tissue) N/A N/A Exposed Structures: Exposed: Yes Fascia: No Tendon: No Muscle: No Joint: No Bone: No None N/A  N/A Epithelialization: N/A N/A N/A Debridement: N/A N/A N/A Pain Control: N/A N/A N/A Tissue Debrided: N/A N/A N/A Level: N/A N/A N/A Debridement A (sq cm): rea N/A N/A N/A Instrument: N/A N/A N/A Bleeding: N/A N/A N/A Hemostasis A chieved: N/A N/A N/A Procedural Pain: N/A N/A N/A Post Procedural Pain: Debridement Treatment Response: N/A N/A N/A Post Debridement Measurements L x N/A N/A N/A W x D (cm) N/A N/A N/A Post Debridement Volume: (cm) N/A N/A N/A Post Debridement Stage: N/A N/A N/A Procedures Performed: Treatment Notes Electronic Signature(s) Signed: 08/20/2019 5:47:00 PM By: Linton Ham MD Signed: 08/21/2019 4:42:40 PM By: Levan Hurst RN, BSN Entered By: Linton Ham on 08/20/2019 15:27:48 -------------------------------------------------------------------------------- Multi-Disciplinary Care Plan Details Patient Name: Date of Service: Anthony Dunlap Anthony D. 08/20/2019 1:30 PM Medical Record Number: [id number] Patient Account Number: [id number] Date of Birth/Sex: Treating RN: [birth year] (86 y.o. Anthony Dunlap Primary Care : [name] Other Clinician: Referring : Treating /Extender: Garfield Cornea in Treatment: 3 Active Inactive Nutrition Nursing Diagnoses: Potential for alteratiion in Nutrition/Potential for  imbalanced nutrition Goals: Patient/caregiver verbalizes understanding of need to maintain therapeutic glucose control per primary care physician Date Initiated: 07/24/2019 Target Resolution Date: 09/11/2019 Goal Status: Active Interventions: Provide education on nutrition Treatment Activities: Education provided on Nutrition : 08/07/2019 Notes: Pain, Acute or Chronic Nursing Diagnoses: Pain, acute or chronic: actual or potential Goals: Patient/caregiver will verbalize comfort level met Date Initiated: 07/24/2019 Target Resolution Date: 09/11/2019 Goal Status:  Active Interventions: Provide education on pain management Notes: Pressure Nursing Diagnoses: Knowledge deficit related to management of pressures ulcers Goals: Patient/caregiver will verbalize risk factors for pressure ulcer development Date Initiated: 07/24/2019 Target Resolution Date: 09/11/2019 Goal Status: Active Interventions: Provide education on pressure ulcers Notes: Wound/Skin Impairment Nursing Diagnoses: Impaired tissue integrity Goals: Ulcer/skin breakdown will have a volume reduction of 50% by week 8 Date Initiated: 07/24/2019 Target Resolution Date: 09/11/2019 Goal Status: Active Interventions: Provide education on ulcer and skin care Notes: Electronic Signature(s) Signed: 08/21/2019 4:42:40 PM By: Levan Hurst RN, BSN Entered By: Levan Hurst on 08/20/2019 15:54:40 -------------------------------------------------------------------------------- Pain Assessment Details Patient Name: Date of Service: 57, Anthony Pascal Anthony D. 08/20/2019 1:30 PM Medical Record Number: [id number] Patient Account Number: [id number] Date of Birth/Sex: Treating RN: 1933/02/23 (86 y.o. Anthony Dunlap Primary Care : [name] Other Clinician: Referring : Treating /Extender: Garfield Cornea in Treatment: 3 Active Problems Location of Pain Severity and Description of Pain Patient Has Paino No Site Locations Pain Management and Medication Current Pain Management: Electronic Signature(s) Signed: 08/21/2019 3:42:07 PM By: Carlene Coria RN Entered By: Carlene Coria on 08/20/2019 14:23:46 -------------------------------------------------------------------------------- Patient/Caregiver Education Details Patient Name: Date of Service: Anthony Bering D. 7/1/2021andnbsp1:30 PM Medical Record Number: [id number] Patient Account Number: [id number] Date of Birth/Gender: Treating RN: [birth year] (86 y.o. Anthony Dunlap Primary Care Physician:  [name] Other Clinician: Referring Physician: Treating Physician/Extender: Garfield Cornea in Treatment: 3 Education Assessment Education Provided To: Patient Education Topics Provided Wound/Skin Impairment: Methods: Explain/Verbal Responses: State content correctly Electronic Signature(s) Signed: 08/21/2019 4:42:40 PM By: Levan Hurst RN, BSN Entered By: Levan Hurst on 08/20/2019 15:54:52 -------------------------------------------------------------------------------- Wound Assessment Details Patient Name: Date of Service: Anthony Dunlap, Anthony Pascal Anthony D. 08/20/2019 1:30 PM Medical Record Number: [id number] Patient Account Number: [id number] Date of Birth/Sex: Treating RN: [birth year] (86 y.o. Anthony Dunlap) Carlene Coria Primary Care : [name] Other Clinician: Referring : Treating /Extender: Garfield Cornea in Treatment: 3 Wound Status Wound Number: 1 Primary Pressure Ulcer Etiology: Wound Location: Left Calcaneus Wound Status: Open Wounding Event: Gradually Appeared Comorbid Hypertension, Type II Diabetes, Osteomyelitis, Received Date Acquired: 06/06/2019 History: Radiation Weeks Of Treatment: 3 Clustered Wound: No Wound Measurements Length: (cm) 5 Width: (cm) 4 Depth: (cm) 0.3 Area: (cm) 15.708 Volume: (cm) 4.712 % Reduction in Area: 13% % Reduction in Volume: -160.9% Epithelialization: None Tunneling: No Undermining: No Wound Description Classification: Category/Stage III Wound Margin: Distinct, outline attached Exudate Amount: Large Exudate Type: Serosanguineous Exudate Color: red, brown Foul Odor After Cleansing: No Slough/Fibrino Yes Wound Bed Granulation Amount: None Present (0%) Exposed Structure Necrotic Amount: Large (67-100%) Fascia Exposed: No Necrotic Quality: Eschar, Adherent Slough Fat Layer (Subcutaneous Tissue) Exposed: Yes Tendon Exposed: No Muscle Exposed: No Joint  Exposed: No Bone Exposed: No Treatment Notes Wound #1 (Left Calcaneus) 1. Cleanse With Wound Cleanser 3. Primary Dressing Applied Santyl 4. Secondary Dressing Dry Gauze Heel Cup 5. Secured With Recruitment consultant) Signed: 08/21/2019 3:42:07 PM By: Carlene Coria RN Entered By: Carlene Coria on 08/20/2019 14:27:06 -------------------------------------------------------------------------------- Wound Assessment  Details Patient Name: Date of Service: Anthony Dunlap, Anthony D. 08/20/2019 1:30 PM Medical Record Number: [id number] Patient Account Number: [id number] Date of Birth/Sex: Treating RN: 1933/02/23 (86 y.o. Anthony Dunlap) Carlene Coria Primary Care : [name] Other Clinician: Referring : Treating /Extender: Garfield Cornea in Treatment: 3 Wound Status Wound Number: 2 Primary Pressure Ulcer Etiology: Wound Location: Right Calcaneus Wound Status: Open Wounding Event: Gradually Appeared Comorbid Hypertension, Type II Diabetes, Osteomyelitis, Received Date Acquired: 06/06/2019 History: Radiation Weeks Of Treatment: 3 Clustered Wound: No Wound Measurements Length: (cm) 6 Width: (cm) 5 Depth: (cm) 1.2 Area: (cm) 23.562 Volume: (cm) 28.274 % Reduction in Area: -15.4% % Reduction in Volume: -1284.6% Epithelialization: None Tunneling: No Undermining: No Wound Description Classification: Unstageable/Unclassified Wound Margin: Distinct, outline attached Exudate Amount: Large Exudate Type: Purulent Exudate Color: yellow, brown, green Foul Odor After Cleansing: No Slough/Fibrino Yes Wound Bed Granulation Amount: None Present (0%) Exposed Structure Necrotic Amount: Large (67-100%) Fascia Exposed: No Necrotic Quality: Eschar, Adherent Slough Fat Layer (Subcutaneous Tissue) Exposed: Yes Tendon Exposed: No Muscle Exposed: No Joint Exposed: No Bone Exposed: No Treatment Notes Wound #2 (Right Calcaneus) 1. Cleanse With Wound  Cleanser 3. Primary Dressing Applied Santyl 4. Secondary Dressing Dry Gauze Heel Cup 5. Secured With Recruitment consultant) Signed: 08/21/2019 3:42:07 PM By: Carlene Coria RN Entered By: Carlene Coria on 08/20/2019 14:27:15 -------------------------------------------------------------------------------- Wound Assessment Details Patient Name: Date of Service: Anthony Dunlap, Anthony Anthony D. 08/20/2019 1:30 PM Medical Record Number: [id number] Patient Account Number: [id number] Date of Birth/Sex: Treating RN: 28-Sep-1933 (86 y.o. Anthony Dunlap) Carlene Coria Primary Care : [name] Other Clinician: Referring : Treating /Extender: Garfield Cornea in Treatment: 3 Wound Status Wound Number: 3 Primary Diabetic Wound/Ulcer of the Lower Extremity Etiology: Wound Location: Right T Fifth oe Wound Status: Open Wounding Event: Gradually Appeared Comorbid Hypertension, Type II Diabetes, Osteomyelitis, Received Date Acquired: 06/06/2019 History: Radiation Weeks Of Treatment: 3 Clustered Wound: No Wound Measurements Length: (cm) 0.3 Width: (cm) 0.1 Depth: (cm) 0.1 Area: (cm) 0.024 Volume: (cm) 0.002 % Reduction in Area: 74.5% % Reduction in Volume: 77.8% Epithelialization: Small (1-33%) Tunneling: No Undermining: No Wound Description Classification: Grade 2 Wound Margin: Flat and Intact Exudate Amount: Small Exudate Type: Serosanguineous Exudate Color: red, brown Foul Odor After Cleansing: No Wound Bed Granulation Amount: None Present (0%) Exposed Structure Necrotic Amount: Large (67-100%) Fascia Exposed: No Necrotic Quality: Adherent Slough Fat Layer (Subcutaneous Tissue) Exposed: No Tendon Exposed: No Muscle Exposed: No Joint Exposed: No Bone Exposed: No Treatment Notes Wound #3 (Right Toe Fifth) 1. Cleanse With Wound Cleanser 3. Primary Dressing Applied Santyl 4. Secondary Dressing Dry Gauze Heel Cup 5. Secured  With Recruitment consultant) Signed: 08/21/2019 3:42:07 PM By: Carlene Coria RN Entered By: Carlene Coria on 08/20/2019 14:27:23 -------------------------------------------------------------------------------- Wound Assessment Details Patient Name: Date of Service: Anthony Dunlap, Anthony Anthony D. 08/20/2019 1:30 PM Medical Record Number: [id number] Patient Account Number: [id number] Date of Birth/Sex: Treating RN: 1933/02/24 (86 y.o. Anthony Dunlap) Carlene Coria Primary Care : [name] Other Clinician: Referring : Treating /Extender: Garfield Cornea in Treatment: 3 Wound Status Wound Number: 4 Primary Diabetic Wound/Ulcer of the Lower Extremity Etiology: Wound Location: Right, Lateral Foot Wound Status: Open Wounding Event: Gradually Appeared Comorbid Hypertension, Type II Diabetes, Osteomyelitis, Received Date Acquired: 06/06/2019 History: Radiation Weeks Of Treatment: 3 Clustered Wound: No Wound Measurements Length: (cm) 1.1 Width: (cm) 1.3 Depth: (cm) 0.3 Area: (cm) 1.123 Volume: (cm) 0.337 % Reduction in Area: 0% %  Reduction in Volume: 24.9% Epithelialization: None Tunneling: No Undermining: No Wound Description Classification: Grade 2 Wound Margin: Distinct, outline attached Exudate Amount: Medium Exudate Type: Serosanguineous Exudate Color: red, brown Foul Odor After Cleansing: No Slough/Fibrino Yes Wound Bed Granulation Amount: None Present (0%) Exposed Structure Necrotic Amount: Large (67-100%) Fascia Exposed: No Necrotic Quality: Adherent Slough Fat Layer (Subcutaneous Tissue) Exposed: Yes Tendon Exposed: No Muscle Exposed: No Joint Exposed: No Bone Exposed: No Treatment Notes Wound #4 (Right, Lateral Foot) 1. Cleanse With Wound Cleanser 3. Primary Dressing Applied Santyl 4. Secondary Dressing Dry Gauze Heel Cup 5. Secured With Recruitment consultant) Signed: 08/21/2019 3:42:07 PM By: Carlene Coria RN Entered  By: Carlene Coria on 08/20/2019 14:27:30 -------------------------------------------------------------------------------- Vitals Details Patient Name: Date of Service: Anthony Dunlap Anthony D. 08/20/2019 1:30 PM Medical Record Number: [id number] Patient Account Number: [id number] Date of Birth/Sex: Treating RN: February 15, 1934 (86 y.o. Anthony Dunlap) Carlene Coria Primary Care Eowyn Tabone: [name] Other Clinician: Referring Elanda Garmany: Treating Nicodemus Denk/Extender: Garfield Cornea in Treatment: 3 Vital Signs Time Taken: 14:23 Temperature (F): 98.4 Height (in): 68 Pulse (bpm): 105 Weight (lbs): 172 Respiratory Rate (breaths/min): 18 Body Mass Index (BMI): 26.1 Blood Pressure (mmHg): 149/78 Reference Range: 80 - 120 mg / dl Electronic Signature(s) Signed: 08/21/2019 3:42:07 PM By: Carlene Coria RN Entered By: Carlene Coria on 08/20/2019 14:23:38

## 2019-08-21 NOTE — Progress Notes (Signed)
Headings Anthony Dunlap ([id number]) , Visit Report for 08/20/2019 Debridement Details Patient Name: Date of Service: Anthony Dunlap, Anthony Dunlap ID D. 08/20/2019 1:30 PM Medical Record Number: [id number] Patient Account Number: [id number] Date of Birth/Sex: Treating RN: 1933/12/20 (86 y.o. Anthony Dunlap Primary Care Provider: Redmond Dunlap Other Clinician: Referring Provider: Treating Provider/Extender: Anthony Dunlap in Treatment: 3 Debridement Performed for Assessment: Wound #1 Left Calcaneus Performed By: Physician Anthony Dunlap., MD Debridement Type: Debridement Level of Consciousness (Pre-procedure): Awake and Alert Pre-procedure Verification/Time Out Yes - 15:03 Taken: Start Time: 15:03 Pain Control: Other : Benzocaine 20% T Area Debrided (L x W): otal 5 (cm) x 4 (cm) = 20 (cm) Tissue and other material debrided: Viable, Non-Viable, Slough, Subcutaneous, Slough Level: Skin/Subcutaneous Tissue Debridement Description: Excisional Instrument: Blade, Curette, Forceps Bleeding: Minimum Hemostasis Achieved: Pressure End Time: 15:04 Procedural Pain: 0 Post Procedural Pain: 0 Response to Treatment: Procedure was tolerated well Level of Consciousness (Post- Awake and Alert procedure): Post Debridement Measurements of Total Wound Length: (cm) 5 Stage: Category/Stage III Width: (cm) 4 Depth: (cm) 0.3 Volume: (cm) 4.712 Character of Wound/Ulcer Post Debridement: Requires Further Debridement Post Procedure Diagnosis Same as Pre-procedure Electronic Signature(s) Signed: 08/20/2019 5:47:00 PM By: Anthony Ham MD Signed: 08/21/2019 4:42:40 PM By: Anthony Hurst RN, BSN Entered By: Anthony Dunlap on 08/20/2019 15:28:04 -------------------------------------------------------------------------------- HPI Details Patient Name: Date of Service: Anthony Dunlap ID D. 08/20/2019 1:30 PM Medical Record Number: [id number] Patient Account Number: [id number] Date of Birth/Sex:  Treating RN: 05-28-1933 (86 y.o. Anthony Dunlap Primary Care Provider: Redmond Dunlap Other Clinician: Referring Provider: Treating Provider/Extender: Anthony Dunlap in Treatment: 3 History of Present Illness HPI Description: ADMISSION 07/24/2019 This is an 86 year old man who has a complicated past medical history however he is a type II diabetic. He recently had a right total hip replacement was hospitalized from 4/23 through 4/27. He was discharged to [facility] in [place] and now has returned to his home in [place]. He comes in today with his caregiver. Apparently he had the open wound on the right lateral fifth metatarsal head even before he had the hip surgery but did not have any other wounds. He has returned home with 2 large probable pressure areas in both heels. They have home health they have been using Medihoney however it has been recently suggested the use Santyl prescribed by Anthony Dunlap The patient has an unstageable wound on the right calcaneus and a least a stage III pressure ulcer on the left. The area on the right fifth metatarsal head also has a necrotic surface and he has a small slitlike area just above the right fifth metatarsal head which seems more superficial. He does not have a known history of PAD. 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 He also had an abscess on his thigh with wound culture showing Enterococcus and Klebsiella. He required admission from 05/08/18 through 05/14/2018 with Candida albicans sepsis which was treated. He was readmitted again from 08/22/2018 through 09/14/2018 with again pubic symphysis abscess. He is now on chronic Augmentin because of this and followed by Anthony Dunlap ABIs in our clinic were 0.71 in the  right and 0.78 on the left 08/07/19; The patient's bilateral foot x-rays were negative without significant bony destruction. Arterial evaluation showed an ABI on the right at 0.78  there was no flow to the great toe. Triphasic waveforms at the PTA and monophasic at the dorsalis pedis Doppler flows showed triphasic waveforms in the thigh at the CFA, DFA, SFA popliteal. The ATA was occluded biphasic at the PTA. On the left ABI was better at 0.97. TBI of 0.54. Triphasic waveforms at the ATA monophasic at the PTA she did have triphasic waveforms at the left at the popliteal, tibial peroneal trunk and ATA but the PTA was occluded or absent. The patient is definitely going to need to see vascular surgery. We have continued with Santyl to the wounds the patient has which includes 2 on the lateral part of the right fifth toe, lateral part of the right fifth met head. Large pressure ulcers over both heels. 7/1; this patient was looked at urgently by Anthony Dunlap of vein and vascular. This is for critical limb ischemia with wounds of the bilateral lower extremities. He has wounds on his bilateral heels as well as a punched-out wound on the right fifth lateral metatarsal head. Notable that he had green drainage coming out of the area on the right fifth metatarsal head. Since he was last here he saw Anthony Dunlap on 6/22 for angiogram. On the right the patient had a patent common femoral profunda, SFA as well as the above- knee and below-knee popliteal artery. 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 The anterior tibial was occluded posterior tibial occluded except for a small island that reconstituted off a collateral from the peroneal. The peroneal was the dominant runoff in the right lower extremity but occluded in the mid calf with a large collateral that filled a small island of the posterior tibial artery. On the left he had a patent common femoral profunda SFA as well as the above  and below-knee popliteal artery. Dominant runoff in the left lower extremity was. The anterior tibial but that had at least 2-3 high-grade stenosis posterior tibial was occluded peroneal reconstituted through a runoff of the anterior tibial but this was also occluded. It was not felt that he had any options in the right lower extremity he recommended a below-knee amputation. He had a repeat angiogram on 6/30 he had a left anterior tibial angioplasty and an unsuccessful anterior grade recanalization of the peroneal artery. The patient is having significant pain predominantly in the right lower extremity. Also noted to have green drainage coming from the right lateral fifth metatarsal head. Electronic Signature(s) Signed: 08/20/2019 5:47:00 PM By: Anthony Ham MD Entered By: Anthony Dunlap on 08/20/2019 15:31:34 -------------------------------------------------------------------------------- Physical Exam Details Patient Name: Date of Service: Anthony Dunlap, Anthony Dunlap ID D. 08/20/2019 1:30 PM Medical Record Number: [id number] Patient Account Number: [id number] Date of Birth/Sex: Treating RN: February 01, 1934 (86 y.o. Anthony Dunlap Primary Care Provider: Redmond Dunlap Other Clinician: Referring Provider: Treating Provider/Extender: Anthony Dunlap in Treatment: 3 Respiratory work of breathing is normal. Cardiovascular Fortunately the pedal pulse on the left is actually palpable. Nothing is palpable on the right dorsalis pedis or posterior tibial. Edema present in both extremities.. Notes Wound exam; right fifth metatarsal head is necrotic debris on the surface, the heel is also necrotic. There was erythema of the right fifth toe which could be ischemic versus cellulitis. Very painful. The left heel which was where we are revascularized I felt we should attempt to debride this. I used a #15 scalpel to remove a large area of necrotic adherent slough and then a #5 curette to clean the  wound surface is much as we could tolerate. Hemostasis with direct pressure Electronic Signature(s) Signed: 08/20/2019 5:47:00 PM By: Anthony Ham MD Entered By: Anthony Dunlap on 08/20/2019 15:33:03 -------------------------------------------------------------------------------- Physician Orders Details Patient Name: Date of Service: Anthony Dunlap, Anthony Dunlap ID D. 08/20/2019 1:30 PM Medical Record Number: [id number] Patient Account Number: [id number] Date of Birth/Sex: Treating RN: 12-13-1933 (86 y.o. Anthony Dunlap Primary Care Provider: Redmond Dunlap Other Clinician: Referring Provider: Treating Provider/Extender: Anthony Dunlap in Treatment: 3 Verbal / Phone Orders: No Diagnosis Coding ICD-10 Coding Code Description E11.621 Type 2 diabetes mellitus with foot ulcer E11.51 Type 2 diabetes mellitus with diabetic peripheral angiopathy without gangrene L89.610 Pressure ulcer of right heel, unstageable L89.623 Pressure ulcer of left heel, stage 3 L97.518 Non-pressure chronic ulcer of other part of right foot with other specified severity E11.42 Type 2 diabetes mellitus with diabetic polyneuropathy Follow-up Appointments Return Appointment in 2 weeks. Dressing Change Frequency Wound #1 Left Calcaneus Change dressing every day. Wound #2 Right Calcaneus Change dressing every day. Wound #3 Right T Fifth oe Change dressing every day. Wound #4 Right,Lateral Foot Change dressing every day. Wound Cleansing Wound #1 Left Calcaneus Clean wound with Normal Saline. - or wound cleanser Wound #2 Right Calcaneus Clean wound with Normal Saline. - or wound cleanser Wound #3 Right T Fifth oe Clean wound with Normal Saline. - or wound cleanser Wound #4 Right,Lateral Foot Clean wound with Normal Saline. - or wound cleanser Primary Wound Dressing Wound #1 Left Calcaneus Santyl Ointment - apply lightly moistened saline gauze over santyl Wound #2 Right Calcaneus Santyl  Ointment - apply lightly moistened saline gauze over santyl Wound #3 Right T Fifth oe Santyl Ointment - apply lightly moistened saline gauze over santyl Wound #4 Right,Lateral Foot Santyl Ointment - apply lightly moistened saline gauze over santyl Secondary Dressing Wound #1 Left Calcaneus Kerlix/Rolled Gauze Dry Gauze Heel Cup Wound #2 Right Calcaneus Kerlix/Rolled Gauze Dry Gauze Heel Cup Wound #3 Right T Fifth oe Kerlix/Rolled Gauze Dry Gauze Wound #4 Right,Lateral Foot Kerlix/Rolled Gauze Dry Gauze Off-Loading Other: - offloading heel sandal to both [facility] skilled nursing for wound care. - [facility] Patient Medications llergies: fluconazole A Notifications Medication Indication Start End wound infection right 08/20/2019 cefdinir foot DOSE oral 300 mg capsule - 1 capsule oral bid for 7 days Electronic Signature(s) Signed: 08/20/2019 3:10:58 PM By: Anthony Ham MD Entered By: Anthony Dunlap on 08/20/2019 15:10:57 -------------------------------------------------------------------------------- Problem List Details Patient Name: Date of Service: Anthony Dunlap ID D. 08/20/2019 1:30 PM Medical Record Number: [id number] Patient Account Number: [id number] Date of Birth/Sex: Treating RN: [birth year] (86 y.o. Anthony Dunlap Primary Care Provider: Redmond Dunlap Other Clinician: Referring Provider: Treating Provider/Extender: Anthony Dunlap in Treatment: 3 Active Problems ICD-10 Encounter Code Description Active Date MDM Diagnosis E11.621 Type 2 diabetes mellitus with foot ulcer 07/24/2019 No Yes E11.51 Type 2 diabetes mellitus with diabetic peripheral angiopathy without gangrene 07/24/2019 No Yes L89.610 Pressure ulcer of right heel, unstageable 07/24/2019 No Yes L89.623 Pressure ulcer of left heel, stage 3 07/24/2019 No Yes L97.518 Non-pressure chronic ulcer of other part of right foot with other specified 07/24/2019 No  Yes severity E11.42 Type 2 diabetes mellitus with diabetic polyneuropathy 07/24/2019 No Yes Inactive Problems Resolved Problems Electronic Signature(s) Signed: 08/20/2019 5:47:00 PM By: Anthony Ham MD Entered By: Anthony Dunlap on 08/20/2019 15:27:23 -------------------------------------------------------------------------------- Progress Note Details Patient Name: Date of Service: Anthony Dunlap ID D. 08/20/2019 1:30 PM Medical Record  Number: [id number] Patient Account Number: [id number] Date of Birth/Sex: Treating RN: Jul 20, 1933 (86 y.o. Anthony Dunlap Primary Care Provider: Redmond Dunlap Other Clinician: Referring Provider: Treating Provider/Extender: Anthony Dunlap in Treatment: 3 Subjective History of Present Illness (HPI) ADMISSION 07/24/2019 This is an 86 year old man who has a complicated past medical history however he is a type II diabetic. He recently had a right total hip replacement was hospitalized from 4/23 through 4/27. He was discharged to [facility] in [place] and now has returned to his home in [place]. He comes in today with his caregiver. Apparently he had the open wound on the right lateral fifth metatarsal head even before he had the hip surgery but did not have any other wounds. He has returned home with 2 large probable pressure areas in both heels. They have home health they have been using Medih[facility] however it has been recently suggested the use Santyl prescribed by Anthony Dunlap The patient has an unstageable wound on the right calcaneus and a least a stage III pressure ulcer on the left. The area on the right fifth metatarsal head also has a necrotic surface and he has a small slitlike area just above the right fifth metatarsal head which seems more superficial. He does not have a known history of PAD. 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 He also had an abscess on his thigh with wound culture showing Enterococcus and Klebsiella. He required admission from 05/08/18 through 05/14/2018 with Candida albicans sepsis which was treated. He was readmitted again from 08/22/2018 through 09/14/2018 with again pubic symphysis abscess. He is now on chronic Augmentin because of this and followed by Anthony Dunlap ABIs in our clinic were 0.71 in the right and 0.78 on the left 08/07/19; The patient's bilateral foot x-rays were negative without significant bony destruction. Arterial evaluation showed an ABI on the right at 0.78 there was no flow to the great toe. Triphasic waveforms at the PTA and monophasic at the dorsalis pedis Doppler flows showed triphasic waveforms in the thigh at the CFA, DFA, SFA popliteal. The ATA was occluded biphasic at the PTA. On the left ABI was better at 0.97. TBI of 0.54. Triphasic waveforms at the ATA monophasic at the PTA she did have triphasic waveforms at the left at the popliteal, tibial peroneal trunk and ATA but the PTA was occluded or absent. The patient is definitely going to need to see vascular surgery. We have continued with Santyl to the wounds the patient has which includes 2 on the lateral part of the right fifth toe, lateral part of the right fifth met head. Large pressure ulcers over both heels. 7/1; this patient was looked at urgently by Anthony Dunlap of vein and vascular. This is for critical limb ischemia with wounds of the bilateral lower extremities. He has wounds on his bilateral heels as well as a punched-out wound on the right fifth lateral metatarsal head. Notable that he had green drainage coming out of the area on the right fifth metatarsal head. Since he was last here he saw Anthony Dunlap on 6/22 for angiogram. On the right the patient had a patent common femoral profunda, SFA as well as  the above- knee and below-knee popliteal artery. 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 The anterior tibial was  occluded posterior tibial occluded except for a small island that reconstituted off a collateral from the peroneal. The peroneal was the dominant runoff in the right lower extremity but occluded in the mid calf with a large collateral that filled a small island of the posterior tibial artery. On the left he had a patent common femoral profunda SFA as well as the above and below-knee popliteal artery. Dominant runoff in the left lower extremity was. The anterior tibial but that had at least 2-3 high-grade stenosis posterior tibial was occluded peroneal reconstituted through a runoff of the anterior tibial but this was also occluded. It was not felt that he had any options in the right lower extremity he recommended a below-knee amputation. He had a repeat angiogram on 6/30 he had a left anterior tibial angioplasty and an unsuccessful anterior grade recanalization of the peroneal artery. The patient is having significant pain predominantly in the right lower extremity. Also noted to have green drainage coming from the right lateral fifth metatarsal head. Objective Constitutional Vitals Time Taken: 2:23 PM, Height: 68 in, Weight: 172 lbs, BMI: 26.1, Temperature: 98.4 F, Pulse: 105 bpm, Respiratory Rate: 18 breaths/min, Blood Pressure: 149/78 mmHg. Respiratory work of breathing is normal. Cardiovascular Fortunately the pedal pulse on the left is actually palpable. Nothing is palpable on the right dorsalis pedis or posterior tibial. Edema present in both extremities.. General Notes: Wound exam; right fifth metatarsal head is necrotic debris on the surface, the heel is also necrotic. There was erythema of the right fifth toe which could be ischemic versus cellulitis. Very painful. ooThe left heel which was where we are revascularized I felt we should  attempt to debride this. I used a #15 scalpel to remove a large area of necrotic adherent slough and then a #5 curette to clean the wound surface is much as we could tolerate. Hemostasis with direct pressure Integumentary (Hair, Skin) Wound #1 status is Open. Original cause of wound was Gradually Appeared. The wound is located on the Left Calcaneus. The wound measures 5cm length x 4cm width x 0.3cm depth; 15.708cm^2 area and 4.712cm^3 volume. There is Fat Layer (Subcutaneous Tissue) Exposed exposed. There is no tunneling or undermining noted. There is a large amount of serosanguineous drainage noted. The wound margin is distinct with the outline attached to the wound base. There is no granulation within the wound bed. There is a large (67-100%) amount of necrotic tissue within the wound bed including Eschar and Adherent Slough. Wound #2 status is Open. Original cause of wound was Gradually Appeared. The wound is located on the Right Calcaneus. The wound measures 6cm length x 5cm width x 1.2cm depth; 23.562cm^2 area and 28.274cm^3 volume. There is Fat Layer (Subcutaneous Tissue) Exposed exposed. There is no tunneling or undermining noted. There is a large amount of purulent drainage noted. The wound margin is distinct with the outline attached to the wound base. There is no granulation within the wound bed. There is a large (67-100%) amount of necrotic tissue within the wound bed including Eschar and Adherent Slough. Wound #3 status is Open. Original cause of wound was Gradually Appeared. The wound is located on the Right T Fifth. The wound measures 0.3cm length x oe 0.1cm width x 0.1cm depth; 0.024cm^2 area and 0.002cm^3 volume. There is no tunneling or undermining noted. There is a small amount of serosanguineous drainage noted. The wound margin is flat and intact. There is no granulation within the wound bed. There is a large (67-100%) amount of  necrotic tissue within the wound bed including  Adherent Slough. Wound #4 status is Open. Original cause of wound was Gradually Appeared. The wound is located on the Right,Lateral Foot. The wound measures 1.1cm length x 1.3cm width x 0.3cm depth; 1.123cm^2 area and 0.337cm^3 volume. There is Fat Layer (Subcutaneous Tissue) Exposed exposed. There is no tunneling or undermining noted. There is a medium amount of serosanguineous drainage noted. The wound margin is distinct with the outline attached to the wound base. There is no granulation within the wound bed. There is a large (67-100%) amount of necrotic tissue within the wound bed including Adherent Slough. 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 There was a Excisional Skin/Subcutaneous Tissue Debridement with a total area of 20 sq cm performed by Anthony Dunlap., MD. With the following instrument(s): Blade, Curette, and Forceps to remove Viable and Non-Viable tissue/material. Material removed includes Subcutaneous Tissue and Slough and after achieving pain control using Other (Benzocaine 20%). No specimens were taken. A time out was conducted at 15:03, prior to the start of the procedure. A Minimum amount of bleeding was controlled with Pressure. The procedure was tolerated well with a pain level of 0 throughout and a pain level of 0 following the procedure. Post Debridement Measurements: 5cm length x 4cm width x 0.3cm depth; 4.712cm^3 volume. Post debridement Stage noted as Category/Stage III. Character of Wound/Ulcer Post Debridement requires further  debridement. Post procedure Diagnosis Wound #1: Same as Pre-Procedure Plan Follow-up Appointments: Return Appointment in 2 weeks. Dressing Change Frequency: Wound #1 Left Calcaneus: Change dressing every day. Wound #2 Right Calcaneus: Change dressing every day. Wound #3 Right T Fifth: oe Change dressing every day. Wound #4 Right,Lateral Foot: Change dressing every day. Wound Cleansing: Wound #1 Left Calcaneus: Clean wound with Normal Saline. - or wound cleanser Wound #2 Right Calcaneus: Clean wound with Normal Saline. - or wound cleanser Wound #3 Right T Fifth: oe Clean wound with Normal Saline. - or wound cleanser Wound #4 Right,Lateral Foot: Clean wound with Normal Saline. - or wound cleanser Primary Wound Dressing: Wound #1 Left Calcaneus: Santyl Ointment - apply lightly moistened saline gauze over santyl Wound #2 Right Calcaneus: Santyl Ointment - apply lightly moistened saline gauze over santyl Wound #3 Right T Fifth: oe Santyl Ointment - apply lightly moistened saline gauze over santyl Wound #4 Right,Lateral Foot: Santyl Ointment - apply lightly moistened saline gauze over santyl Secondary Dressing: Wound #1 Left Calcaneus: Kerlix/Rolled Gauze Dry Gauze Heel Cup Wound #2 Right Calcaneus: Kerlix/Rolled Gauze Dry Gauze Heel Cup Wound #3 Right T Fifth: oe Kerlix/Rolled Gauze Dry Gauze Wound #4 Right,Lateral Foot: Kerlix/Rolled Gauze Dry Gauze Off-Loading: Other: - offloading heel sandal to both feet Home Health: [facility] for wound care. - [facility] The following medication(s) was prescribed: cefdinir oral 300 mg capsule 1 capsule oral bid for 7 days for wound infection right foot starting 08/20/2019 1. Continue with santyol 2I am going to treat hime emperically for drainage coming out of the right 5th met head amd ocellulits of the right 5th toe 3 noting is salvagealbe on the right, Pain is significant.  this was discussed 4 mat be  able to salvage something on the left although the wound is on the heel 5 cefdinir 300 bid for 7 days Electronic Signature(s) Signed: 08/20/2019 5:47:00 PM By: Anthony Ham MD Entered By: Anthony Dunlap on 08/20/2019 15:39:00 -------------------------------------------------------------------------------- SuperBill Details Patient Name: Date of Service: Anthony Dunlap ID D. 08/20/2019 Medical Record Number: [id number] Patient Account Number: [id number] Date of Birth/Sex: Treating RN: 1933-10-16 (86 y.o. Anthony Dunlap Primary Care Provider: Redmond Dunlap Other Clinician: Referring Provider: Treating Provider/Extender: Anthony Dunlap in Treatment: 3 Diagnosis Coding ICD-10 Codes Code Description [phone] Type 2 diabetes mellitus with foot ulcer E11.51 Type 2 diabetes mellitus with diabetic peripheral angiopathy without gangrene L89.610 Pressure ulcer of right heel, unstageable L89.623 Pressure ulcer of left heel, stage 3 L97.518 Non-pressure chronic ulcer of other part of right foot with other specified severity E11.42 Type 2 diabetes mellitus with diabetic polyneuropathy Facility Procedures CPT4 Code: [id number] Description: 44920 - DEB SUBQ TISSUE 20 SQ CM/< ICD-10 Diagnosis Description 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

## 2019-08-22 ENCOUNTER — Other Ambulatory Visit: Payer: Self-pay | Admitting: Infectious Disease

## 2019-08-22 MED ORDER — DOXYCYCLINE HYCLATE 100 MG PO TABS
100.0000 mg | ORAL_TABLET | Freq: Two times a day (BID) | ORAL | 1 refills | Status: DC
Start: 2019-08-22 — End: 2019-10-28

## 2019-08-22 NOTE — Progress Notes (Unsigned)
doxy 

## 2019-08-26 ENCOUNTER — Encounter (HOSPITAL_COMMUNITY): Payer: Self-pay | Admitting: Vascular Surgery

## 2019-08-27 ENCOUNTER — Encounter (HOSPITAL_BASED_OUTPATIENT_CLINIC_OR_DEPARTMENT_OTHER): Payer: Medicare Other | Admitting: Internal Medicine

## 2019-08-27 ENCOUNTER — Other Ambulatory Visit (HOSPITAL_COMMUNITY)
Admission: RE | Admit: 2019-08-27 | Discharge: 2019-08-27 | Disposition: A | Discharge status: Home / Self Care | Payer: Medicare Other | Source: Other Acute Inpatient Hospital | Attending: Internal Medicine | Admitting: Internal Medicine

## 2019-08-27 DIAGNOSIS — E11621 Type 2 diabetes mellitus with foot ulcer: Secondary | ICD-10-CM | POA: Diagnosis not present

## 2019-08-30 LAB — AEROBIC CULTURE W GRAM STAIN (SUPERFICIAL SPECIMEN): Gram Stain: NONE SEEN

## 2019-09-01 NOTE — Progress Notes (Addendum)
Anthony Dunlap ([id number]) , Visit Report for 08/27/2019 Arrival Information Details Patient Name: Date of Service: Anthony Dunlap, Anthony D. 08/27/2019 10:15 A M Medical Record Number: [id number] Patient Account Number: [id number] Date of Birth/Sex: Treating RN: 05/21/1933 (86 y.o. Anthony Dunlap Primary Care Aftyn Nott: [name] Other Clinician: Referring Robby Pirani: Treating Aceton Kinnear/Extender: Garfield Cornea in Treatment: 4 Visit Information History Since Last Visit Added or deleted any medications: No Patient Arrived: [name] Any new allergies or adverse reactions: No Arrival Time: 10:27 Had a fall or experienced change in No Accompanied By: self activities of daily living that may affect Transfer Assistance: None risk of falls: Patient Identification Verified: Yes Signs or symptoms of abuse/neglect since last visito No Secondary Verification Process Completed: Yes Hospitalized since last visit: No Patient Has Alerts: No Implantable device outside of the clinic excluding No cellular tissue based products placed in the center since last visit: Has Dressing in Place as Prescribed: Yes Pain Present Now: No Electronic Signature(s) Signed: 08/27/2019 5:25:24 PM By: Kela Millin Entered By: Kela Millin on 08/27/2019 10:27:37 -------------------------------------------------------------------------------- Encounter Discharge Information Details Patient Name: Date of Service: Anthony Dunlap ID D. 08/27/2019 10:15 A M Medical Record Number: [id number] Patient Account Number: [id number] Date of Birth/Sex: Treating RN: [birth year] (86 y.o. Anthony Dunlap Primary Care Heatherly Stenner: [name] Other Clinician: Referring Nivin Braniff: Treating Darrelle Wiberg/Extender: Garfield Cornea in Treatment: 4 Encounter Discharge Information Items Post Procedure Vitals Discharge Condition: Stable Temperature (F): 98.4 Ambulatory Status: Walker Pulse  (bpm): 101 Discharge Destination: Home Respiratory Rate (breaths/min): 18 Transportation: Private Auto Blood Pressure (mmHg): 157/82 Accompanied By: [email or web address] Schedule Follow-up Appointment: Yes Clinical Summary of Care: Patient Declined Electronic Signature(s) Signed: 08/31/2019 5:13:16 PM By: Carlene Coria RN Entered By: Carlene Coria on 08/27/2019 11:15:04 -------------------------------------------------------------------------------- Lower Extremity Assessment Details Patient Name: Date of Service: Anthony Dunlap, Anthony ID D. 08/27/2019 10:15 A M Medical Record Number: [id number] Patient Account Number: [id number] Date of Birth/Sex: Treating RN: 05/05/1933 (86 y.o. Anthony Dunlap Primary Care Derrica Sieg: [name] Other Clinician: Referring Bernell Sigal: Treating Cordero Surette/Extender: Garfield Cornea in Treatment: 4 Edema Assessment Assessed: Shirlyn Goltz: No] Patrice Paradise: No] Edema: [Left: No] [Right: No] Calf Left: Right: Point of Measurement: 42 cm From Medial Instep 31 cm 30.5 cm Ankle Left: Right: Point of Measurement: 10 cm From Medial Instep 20.5 cm 22.5 cm Vascular Assessment Pulses: Dorsalis Pedis Palpable: [Left:No] [Right:No] Electronic Signature(s) Signed: 08/27/2019 5:25:24 PM By: Kela Millin Entered By: Kela Millin on 08/27/2019 10:28:46 -------------------------------------------------------------------------------- Multi Wound Chart Details Patient Name: Date of Service: Anthony Dunlap ID D. 08/27/2019 10:15 A M Medical Record Number: [id number] Patient Account Number: [id number] Date of Birth/Sex: Treating RN: 04-Jun-1933 (86 y.o. Anthony Dunlap Primary Care Shaketha Jeon: [name] Other Clinician: Referring Khaleel Beckom: Treating Khya Halls/Extender: Garfield Cornea in Treatment: 4 Vital Signs Height(in): 68 Capillary Blood Glucose(mg/dl): 190 Weight(lbs): 172 Pulse(bpm): 101 Body Mass Index(BMI): 26 Blood  Pressure(mmHg): 157/82 Temperature(F): 98.4 Respiratory Rate(breaths/min): 18 Photos: [1:No Photos Left Calcaneus] [2:No Photos Right Calcaneus] [3:No Photos Right T Fifth oe] Wound Location: [1:Gradually Appeared] [2:Gradually Appeared] [3:Gradually Appeared] Wounding Event: [1:Pressure Ulcer] [2:Pressure Ulcer] [3:Diabetic Wound/Ulcer of the Lower] Primary Etiology: [1:Hypertension, Type II Diabetes,] [2:Hypertension, Type II Diabetes,] [3:Extremity Hypertension, Type II Diabetes,] Comorbid History: [1:Osteomyelitis, Received Radiation 06/06/2019] [2:Osteomyelitis, Received Radiation 06/06/2019] [3:Osteomyelitis, Received Radiation 06/06/2019] Date Acquired: [1:4] [2:4] [3:4] Weeks of Treatment: [1:Open] [2:Open] [3:Open] Wound Status: [1:5.6x3.7x0.7] [2:5.2x5x1] [3:0.2x0.5x0.2] Measurements L x W x D (cm) [1:16.273] [2:20.42] [  3:0.079] A (cm) : rea [1:11.391] [2:20.42] [3:0.016] Volume (cm) : [1:9.90%] [2:0.00%] [3:16.00%] % Reduction in A rea: [1:-530.70%] [2:-900.00%] [3:-77.80%] % Reduction in Volume: [2:4] Starting Position 1 (o'clock): [2:9] Ending Position 1 (o'clock): [2:0.4] Maximum Distance 1 (cm): [1:No] [2:Yes] [3:No] Undermining: [1:Category/Stage III] [2:Unstageable/Unclassified] [3:Grade 2] Classification: [1:Large] [2:Large] [3:Small] Exudate A mount: [1:Purulent] [2:Purulent] [3:Serosanguineous] Exudate Type: [1:yellow, brown, green] [2:yellow, brown, green] [3:red, brown] Exudate Color: [1:No] [2:Yes] [3:No] Foul Odor A Cleansing: [1:fter N/A] [2:No] [3:N/A] Odor A nticipated Due to Product Use: [1:Distinct, outline attached] [2:Distinct, outline attached] [3:Flat and Intact] Wound Margin: [1:Small (1-33%)] [2:Small (1-33%)] [3:Small (1-33%)] Granulation A mount: [1:Pink] [2:Pink] [3:Pale] Granulation Quality: [1:Large (67-100%)] [2:Large (67-100%)] [3:Large (67-100%)] Necrotic A mount: [1:Eschar, Adherent Slough] [2:Eschar, Adherent Slough] [3:Adherent  Slough] Necrotic Tissue: [1:Fat Layer (Subcutaneous Tissue)] [2:Fat Layer (Subcutaneous Tissue)] [3:Fat Layer (Subcutaneous Tissue)] Exposed Structures: [1:Exposed: Yes Fascia: No Tendon: No Muscle: No Joint: No Bone: No None] [2:Exposed: Yes Fascia: No Tendon: No Muscle: No Joint: No Bone: No None] [3:Exposed: Yes Fascia: No Tendon: No Muscle: No Joint: No Bone: No Small (1-33%)] Epithelialization: [1:N/A] [2:Debridement - Selective/Open Wound] [3:N/A] Debridement: Pre-procedure Verification/Time Out N/A [2:10:49] [3:N/A] Taken: [1:N/A] [2:Necrotic/Eschar, Slough] [3:N/A] Tissue Debrided: [1:N/A] [2:Non-Viable Tissue] [3:N/A] Level: [1:N/A] [2:9] [3:N/A] Debridement A (sq cm): [1:rea N/A] [2:Blade, Forceps] [3:N/A] Instrument: [1:N/A] [2:Swab] [3:N/A] Specimen: [1:N/A] [2:1] [3:N/A] Number of Specimens Taken: [1:N/A] [2:Minimum] [3:N/A] Bleeding: [1:N/A] [2:Pressure] [3:N/A] Hemostasis A chieved: [1:N/A] [2:0] [3:N/A] Procedural Pain: [1:N/A] [2:0] [3:N/A] Post Procedural Pain: [1:N/A] [2:Procedure was tolerated well] [3:N/A] Debridement Treatment Response: [1:N/A] [2:5.2x5x1] [3:N/A] Post Debridement Measurements L x W x D (cm) [1:N/A] [2:20.42] [3:N/A] Post Debridement Volume: (cm) [1:N/A] [2:Unstageable/Unclassified] [3:N/A] Post Debridement Stage: [1:N/A] [2:Debridement] [3:N/A] Wound Number: 4 N/A N/A Photos: No Photos N/A N/A Right, Lateral Foot N/A N/A Wound Location: Gradually Appeared N/A N/A Wounding Event: Diabetic Wound/Ulcer of the Lower N/A N/A Primary Etiology: Extremity Hypertension, Type II Diabetes, N/A N/A Comorbid History: Osteomyelitis, Received Radiation 06/06/2019 N/A N/A Date Acquired: 4 N/A N/A Weeks of Treatment: Open N/A N/A Wound Status: 1.1x1.4x0.9 N/A N/A Measurements L x W x D (cm) 1.21 N/A N/A A (cm) : rea 1.089 N/A N/A Volume (cm) : -7.70% N/A N/A % Reduction in A rea: -142.50% N/A N/A % Reduction in Volume: 11 Starting Position  1 (o'clock): 2 Ending Position 1 (o'clock): 0.2 Maximum Distance 1 (cm): Yes N/A N/A Undermining: Grade 2 N/A N/A Classification: Medium N/A N/A Exudate A mount: Purulent N/A N/A Exudate Type: yellow, brown, green N/A N/A Exudate Color: Yes N/A N/A Foul Odor A Cleansing: fter No N/A N/A Odor A nticipated Due to Product Use: Distinct, outline attached N/A N/A Wound Margin: Small (1-33%) N/A N/A Granulation A mount: Pink N/A N/A Granulation Quality: Large (67-100%) N/A N/A Necrotic A mount: Adherent Slough N/A N/A Necrotic Tissue: Fat Layer (Subcutaneous Tissue) N/A N/A Exposed Structures: Exposed: Yes Fascia: No Tendon: No Muscle: No Joint: No Bone: No None N/A N/A Epithelialization: N/A N/A N/A Debridement: N/A N/A N/A Tissue Debrided: N/A N/A N/A Level: N/A N/A N/A Debridement A (sq cm): rea N/A N/A N/A Instrument: N/A N/A N/A Bleeding: N/A N/A N/A Hemostasis A chieved: N/A N/A N/A Procedural Pain: N/A N/A N/A Post Procedural Pain: Debridement Treatment Response: N/A N/A N/A Post Debridement Measurements L x N/A N/A N/A W x D (cm) N/A N/A N/A Post Debridement Volume: (cm) N/A N/A N/A Post Debridement Stage: N/A N/A N/A Procedures Performed: Treatment Notes Electronic Signature(s) Signed: 08/27/2019  5:28:43 PM By: Linton Ham MD Signed: 08/31/2019 5:04:17 PM By: Levan Hurst RN, BSN Entered By: Linton Ham on 08/27/2019 10:55:47 -------------------------------------------------------------------------------- Multi-Disciplinary Care Plan Details Patient Name: Date of Service: Anthony Dunlap ID D. 08/27/2019 10:15 A M Medical Record Number: [id number] Patient Account Number: [id number] Date of Birth/Sex: Treating RN: April 10, 1933 (86 y.o. Anthony Dunlap Primary Care Dosha Broshears: [name] Other Clinician: Referring Nadie Fiumara: Treating Dorthula Bier/Extender: Garfield Cornea in Treatment: 4 Active  Inactive Wound/Skin Impairment Nursing Diagnoses: Impaired tissue integrity Goals: Ulcer/skin breakdown will have a volume reduction of 50% by week 8 Date Initiated: 07/24/2019 Target Resolution Date: 09/11/2019 Goal Status: Active Interventions: Provide education on ulcer and skin care Notes: Electronic Signature(s) Signed: 08/31/2019 5:04:17 PM By: Levan Hurst RN, BSN Entered By: Levan Hurst on 08/27/2019 15:41:51 -------------------------------------------------------------------------------- Pain Assessment Details Patient Name: Date of Service: Anthony Dunlap ID D. 08/27/2019 10:15 A M Medical Record Number: [id number] Patient Account Number: [id number] Date of Birth/Sex: Treating RN: 14-Apr-1933 (86 y.o. Anthony Dunlap Primary Care Shaquira Moroz: [name] Other Clinician: Referring Jorie Zee: Treating Teneka Malmberg/Extender: Garfield Cornea in Treatment: 4 Active Problems Location of Pain Severity and Description of Pain Patient Has Paino No Site Locations Pain Management and Medication Current Pain Management: Electronic Signature(s) Signed: 08/27/2019 5:25:24 PM By: Kela Millin Entered By: Kela Millin on 08/27/2019 10:28:19 -------------------------------------------------------------------------------- Patient/Caregiver Education Details Patient Name: Date of Service: Anthony Dunlap ID D. 7/8/2021andnbsp10:15 A M Medical Record Number: [id number] Patient Account Number: [id number] Date of Birth/Gender: Treating RN: [birth year] (86 y.o. Anthony Dunlap Primary Care Physician: [name] Other Clinician: Referring Physician: Treating Physician/Extender: Garfield Cornea in Treatment: 4 Education Assessment Education Provided To: Patient Education Topics Provided Wound/Skin Impairment: Methods: Explain/Verbal Responses: State content correctly Electronic Signature(s) Signed: 08/31/2019 5:04:17 PM  By: Levan Hurst RN, BSN Entered By: Levan Hurst on 08/27/2019 15:41:12 -------------------------------------------------------------------------------- Wound Assessment Details Patient Name: Date of Service: Anthony Dunlap ID D. 08/27/2019 10:15 A M Medical Record Number: [id number] Patient Account Number: [id number] Date of Birth/Sex: Treating RN: August 15, 1933 (86 y.o. Anthony Dunlap Primary Care Ethyn Schetter: [name] Other Clinician: Referring Fox Salminen: Treating Ebonye Reade/Extender: Garfield Cornea in Treatment: 4 Wound Status Wound Number: 1 Primary Pressure Ulcer Etiology: Wound Location: Left Calcaneus Wound Status: Open Wounding Event: Gradually Appeared Comorbid Hypertension, Type II Diabetes, Osteomyelitis, Received Date Acquired: 06/06/2019 History: Radiation Weeks Of Treatment: 4 Clustered Wound: No Photos Photo Uploaded By: Mikeal Hawthorne on 08/27/2019 16:16:40 Wound Measurements Length: (cm) 5.6 % Re Width: (cm) 3.7 % Re Depth: (cm) 0.7 Epit Area: (cm) 16.273 Tun Volume: (cm) 11.391 Und duction in Area: 9.9% duction in Volume: -530.7% helialization: None neling: No ermining: No Wound Description Classification: Category/Stage III Fou Wound Margin: Distinct, outline attached Slo Exudate Amount: Large Exudate Type: Purulent Exudate Color: yellow, brown, green l Odor After Cleansing: No ugh/Fibrino Yes Wound Bed Granulation Amount: Small (1-33%) Exposed Structure Granulation Quality: Pink Fascia Exposed: No Necrotic Amount: Large (67-100%) Fat Layer (Subcutaneous Tissue) Exposed: Yes Necrotic Quality: Eschar, Adherent Slough Tendon Exposed: No Muscle Exposed: No Joint Exposed: No Bone Exposed: No Treatment Notes Wound #1 (Left Calcaneus) 1. Cleanse With Wound Cleanser 3. Primary Dressing Applied Santyl 4. Secondary Dressing Dry Gauze Roll Gauze Heel Cup 5. Secured With Recruitment consultant) Signed:  08/27/2019 5:25:24 PM By: Kela Millin Entered By: Kela Millin on 08/27/2019 10:31:41 -------------------------------------------------------------------------------- Wound Assessment Details Patient Name: Date of Service: Anthony Dunlap, Anthony V ID D. 08/27/2019 10:15  A M Medical Record Number: [id number] Patient Account Number: [id number] Date of Birth/Sex: Treating RN: Aug 16, 1933 (86 y.o. Anthony Dunlap Primary Care Warner Laduca: [name] Other Clinician: Referring Jaidon Sponsel: Treating Chalese Peach/Extender: Garfield Cornea in Treatment: 4 Wound Status Wound Number: 2 Primary Pressure Ulcer Etiology: Wound Location: Right Calcaneus Wound Status: Open Wounding Event: Gradually Appeared Comorbid Hypertension, Type II Diabetes, Osteomyelitis, Received Date Acquired: 06/06/2019 History: Radiation Weeks Of Treatment: 4 Clustered Wound: No Photos Photo Uploaded By: Mikeal Hawthorne on 08/27/2019 16:16:41 Wound Measurements Length: (cm) 5.2 Width: (cm) 5 Depth: (cm) 1 Area: (cm) 20.42 Volume: (cm) 20.42 % Reduction in Area: 0% % Reduction in Volume: -900% Epithelialization: None Tunneling: No Undermining: Yes Starting Position (o'clock): 4 Ending Position (o'clock): 9 Maximum Distance: (cm) 0.4 Wound Description Classification: Unstageable/Unclassified Wound Margin: Distinct, outline attached Exudate Amount: Large Exudate Type: Purulent Exudate Color: yellow, brown, green Foul Odor After Cleansing: Yes Due to Product Use: No Slough/Fibrino Yes Wound Bed Granulation Amount: Small (1-33%) Exposed Structure Granulation Quality: Pink Fascia Exposed: No Necrotic Amount: Large (67-100%) Fat Layer (Subcutaneous Tissue) Exposed: Yes Necrotic Quality: Eschar, Adherent Slough Tendon Exposed: No Muscle Exposed: No Joint Exposed: No Bone Exposed: No Treatment Notes Wound #2 (Right Calcaneus) 1. Cleanse With Wound Cleanser 3. Primary Dressing  Applied Santyl 4. Secondary Dressing Dry Gauze Roll Gauze Heel Cup 5. Secured With Recruitment consultant) Signed: 08/27/2019 5:25:24 PM By: Kela Millin Entered By: Kela Millin on 08/27/2019 10:31:08 -------------------------------------------------------------------------------- Wound Assessment Details Patient Name: Date of Service: Anthony Dunlap ID D. 08/27/2019 10:15 A M Medical Record Number: [id number] Patient Account Number: [id number] Date of Birth/Sex: Treating RN: 04-27-1933 (86 y.o. Anthony Dunlap Primary Care Marquarius Lofton: [name] Other Clinician: Referring Wadell Craddock: Treating Candida Vetter/Extender: Garfield Cornea in Treatment: 4 Wound Status Wound Number: 3 Primary Diabetic Wound/Ulcer of the Lower Extremity Etiology: Wound Location: Right T Fifth oe Wound Status: Open Wounding Event: Gradually Appeared Comorbid Hypertension, Type II Diabetes, Osteomyelitis, Received Date Acquired: 06/06/2019 History: Radiation Weeks Of Treatment: 4 Clustered Wound: No Photos Photo Uploaded By: Mikeal Hawthorne on 08/27/2019 16:17:07 Wound Measurements Length: (cm) 0.2 Width: (cm) 0.5 Depth: (cm) 0.2 Area: (cm) 0.079 Volume: (cm) 0.016 % Reduction in Area: 16% % Reduction in Volume: -77.8% Epithelialization: Small (1-33%) Tunneling: No Undermining: No Wound Description Classification: Grade 2 Wound Margin: Flat and Intact Exudate Amount: Small Exudate Type: Serosanguineous Exudate Color: red, brown Wound Bed Granulation Amount: Small (1-33%) Granulation Quality: Pale Necrotic Amount: Large (67-100%) Necrotic Quality: Adherent Slough Foul Odor After Cleansing: No Slough/Fibrino Yes Exposed Structure Fascia Exposed: No Fat Layer (Subcutaneous Tissue) Exposed: Yes Tendon Exposed: No Muscle Exposed: No Joint Exposed: No Bone Exposed: No Treatment Notes Wound #3 (Right Toe Fifth) 1. Cleanse With Wound Cleanser 3.  Primary Dressing Applied Santyl 4. Secondary Dressing Dry Gauze Roll Gauze Heel Cup 5. Secured With Recruitment consultant) Signed: 08/27/2019 5:25:24 PM By: Kela Millin Entered By: Kela Millin on 08/27/2019 10:30:25 -------------------------------------------------------------------------------- Wound Assessment Details Patient Name: Date of Service: Anthony Dunlap ID D. 08/27/2019 10:15 A M Medical Record Number: [id number] Patient Account Number: [id number] Date of Birth/Sex: Treating RN: 1933/10/25 (86 y.o. Anthony Dunlap Primary Care Maliah Pyles: [name] Other Clinician: Referring Cherisse Carrell: Treating Cianna Kasparian/Extender: Garfield Cornea in Treatment: 4 Wound Status Wound Number: 4 Primary Diabetic Wound/Ulcer of the Lower Extremity Etiology: Wound Location: Right, Lateral Foot Wound Status: Open Wounding Event: Gradually Appeared Comorbid Hypertension, Type II Diabetes, Osteomyelitis, Received Date Acquired:  06/06/2019 History: Radiation Weeks Of Treatment: 4 Clustered Wound: No Photos Photo Uploaded By: Mikeal Hawthorne on 08/27/2019 16:17:07 Wound Measurements Length: (cm) 1.1 Width: (cm) 1.4 Depth: (cm) 0.9 Area: (cm) 1.21 Volume: (cm) 1.089 % Reduction in Area: -7.7% % Reduction in Volume: -142.5% Epithelialization: None Tunneling: No Undermining: Yes Starting Position (o'clock): 11 Ending Position (o'clock): 2 Maximum Distance: (cm) 0.2 Wound Description Classification: Grade 2 Wound Margin: Distinct, outline attached Exudate Amount: Medium Exudate Type: Purulent Exudate Color: yellow, brown, green Foul Odor After Cleansing: Yes Due to Product Use: No Slough/Fibrino Yes Wound Bed Granulation Amount: Small (1-33%) Exposed Structure Granulation Quality: Pink Fascia Exposed: No Necrotic Amount: Large (67-100%) Fat Layer (Subcutaneous Tissue) Exposed: Yes Necrotic Quality: Adherent Slough Tendon Exposed:  No Muscle Exposed: No Joint Exposed: No Bone Exposed: No Treatment Notes Wound #4 (Right, Lateral Foot) 1. Cleanse With Wound Cleanser 3. Primary Dressing Applied Santyl 4. Secondary Dressing Dry Gauze Roll Gauze Heel Cup 5. Secured With Recruitment consultant) Signed: 08/27/2019 5:25:24 PM By: Kela Millin Entered By: Kela Millin on 08/27/2019 10:29:49 -------------------------------------------------------------------------------- Vitals Details Patient Name: Date of Service: Anthony Dunlap ID D. 08/27/2019 10:15 A M Medical Record Number: [id number] Patient Account Number: [id number] Date of Birth/Sex: Treating RN: December 05, 1933 (86 y.o. Anthony Dunlap Primary Care Xylah Early: [name] Other Clinician: Referring Anjana Cheek: Treating Barrington Worley/Extender: Garfield Cornea in Treatment: 4 Vital Signs Time Taken: 10:20 Temperature (F): 98.4 Height (in): 68 Pulse (bpm): 101 Weight (lbs): 172 Respiratory Rate (breaths/min): 18 Body Mass Index (BMI): 26.1 Blood Pressure (mmHg): 157/82 Capillary Blood Glucose (mg/dl): 190 Reference Range: 80 - 120 mg / dl Notes patient stated last CBG was 190 Electronic Signature(s) Signed: 08/27/2019 5:25:24 PM By: Kela Millin Entered By: Kela Millin on 08/27/2019 10:28:12

## 2019-09-01 NOTE — Progress Notes (Addendum)
Anthony Dunlap ([id number]) , Visit Report for 08/27/2019 Debridement Details Patient Name: Date of Service: Anthony Dunlap, Anthony D. 08/27/2019 10:15 A M Medical Record Number: [id number] Patient Account Number: [id number] Date of Birth/Sex: Treating RN: Jan 18, 1934 (86 y.o. Anthony Dunlap Primary Care Dunlap: Anthony Dunlap Anthony Dunlap: Anthony Dunlap: Anthony Dunlap: Anthony Dunlap in Treatment: 4 Debridement Performed for Assessment: Wound #2 Right Calcaneus Performed By: Physician Anthony Dunlap., MD Debridement Type: Debridement Level of Consciousness (Pre-procedure): Awake and Alert Pre-procedure Verification/Time Out Yes - 10:49 Taken: Start Time: 10:49 T Area Debrided (L x W): otal 3 (cm) x 3 (cm) = 9 (cm) Tissue and Anthony material debrided: Non-Viable, Eschar, Slough, Subcutaneous, Slough Level: Skin/Subcutaneous Tissue Debridement Description: Excisional Instrument: Blade, Forceps Specimen: Swab, Number of Specimens T aken: 1 Bleeding: Minimum Hemostasis Achieved: Pressure End Time: 10:51 Procedural Pain: 0 Post Procedural Pain: 0 Response to Treatment: Procedure was tolerated well Level of Consciousness (Post- Awake and Alert procedure): Post Debridement Measurements of Total Wound Length: (cm) 5.2 Stage: Unstageable/Unclassified Width: (cm) 5 Depth: (cm) 1 Volume: (cm) 20.42 Character of Wound/Ulcer Post Debridement: Requires Further Debridement Post Procedure Diagnosis Same as Pre-procedure Electronic Signature(s) Signed: 08/27/2019 5:28:43 PM By: Anthony Ham MD Signed: 08/31/2019 5:04:17 PM By: Anthony Hurst RN, BSN Entered By: Anthony Dunlap on 08/27/2019 11:05:10 -------------------------------------------------------------------------------- HPI Details Patient Name: Date of Service: Anthony Dunlap ID D. 08/27/2019 10:15 A M Medical Record Number: [id number] Patient Account Number: [id number] Date of  Birth/Sex: Treating RN: March 07, 1933 (86 y.o. Anthony Dunlap Primary Care Dunlap: Anthony Dunlap Anthony Dunlap: Anthony Dunlap: Anthony Dunlap: Anthony Dunlap in Treatment: 4 History of Present Illness HPI Description: ADMISSION 07/24/2019 This is an 86 year old man who has a complicated past medical history however he is a type II diabetic. He recently had a right total hip replacement was hospitalized from 4/23 through 4/27. He was discharged to [facility] in [place] and now has returned to his home in [place]. He comes in today with his caregiver. Apparently he had the open wound on the right lateral fifth metatarsal head even before he had the hip surgery but did not have any Anthony wounds. He has returned home with 2 large probable pressure areas in both heels. They have home health they have been using Medihoney however it has been recently suggested the use Santyl prescribed by Anthony Dunlap The patient has an unstageable wound on the right calcaneus and a least a stage III pressure ulcer on the left. The area on the right fifth metatarsal head also has a necrotic surface and he has a small slitlike area just above the right fifth metatarsal head which seems more superficial. He does not have a known history of PAD. 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 He also had an abscess on his thigh with wound culture showing Enterococcus and Klebsiella. He required admission from 05/08/18 through 05/14/2018 with Candida albicans sepsis which was treated. He was readmitted again from 08/22/2018 through 09/14/2018 with again pubic symphysis abscess. He is now on chronic Augmentin because of this and followed by Anthony Dunlap ABIs in our clinic were 0.71  in the right and 0.78 on the left 08/07/19; The patient's bilateral foot x-rays were negative without significant bony destruction. Arterial evaluation showed an ABI on the right  at 0.78 there was no flow to the great toe. Triphasic waveforms at the PTA and monophasic at the dorsalis pedis Doppler flows showed triphasic waveforms in the thigh at the CFA, DFA, SFA popliteal. The ATA was occluded biphasic at the PTA. On the left ABI was better at 0.97. TBI of 0.54. Triphasic waveforms at the ATA monophasic at the PTA she did have triphasic waveforms at the left at the popliteal, tibial peroneal trunk and ATA but the PTA was occluded or absent. The patient is definitely going to need to see vascular surgery. We have continued with Santyl to the wounds the patient has which includes 2 on the lateral part of the right fifth toe, lateral part of the right fifth met head. Large pressure ulcers over both heels. 7/1; this patient was looked at urgently by Anthony Dunlap of vein and vascular. This is for critical limb ischemia with wounds of the bilateral lower extremities. He has wounds on his bilateral heels as well as a punched-out wound on the right fifth lateral metatarsal head. Notable that he had green drainage coming out of the area on the right fifth metatarsal head. Since he was last here he saw Anthony Dunlap on 6/22 for angiogram. On the right the patient had a patent common femoral profunda, SFA as well as the above- knee and below-knee popliteal artery. 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 The anterior tibial was occluded posterior tibial occluded except for a small island that reconstituted off a collateral from the peroneal. The peroneal was the dominant runoff in the right lower extremity but occluded in the mid calf with a large collateral that filled a small island of the posterior tibial artery. On the left he had a patent common femoral profunda SFA as well as the  above and below-knee popliteal artery. Dominant runoff in the left lower extremity was. The anterior tibial but that had at least 2-3 high-grade stenosis posterior tibial was occluded peroneal reconstituted through a runoff of the anterior tibial but this was also occluded. It was not felt that he had any options in the right lower extremity he recommended a below-knee amputation. He had a repeat angiogram on 6/30 he had a left anterior tibial angioplasty and an unsuccessful anterior grade recanalization of the peroneal artery. The patient is having significant pain predominantly in the right lower extremity. Also noted to have green drainage coming from the right lateral fifth metatarsal head. 7/8; the patient has a large wound on the right heel laterally and the right lateral fifth metatarsal head. Last week there was green drainage coming out of the wound on the right heel. I empirically put him on cefdinir without recognizing that he was already on Augmentin. Dr. Lucianne Lei dam apparently over the phone change this to doxycycline I have not altered this. There is an odor today still some green drainage. He also has an area on the left heel. We have been using Santyl to all wound areas. He does not have a revascularization option on the right. He is status post recent left anterior tibial angioplasty Electronic Signature(s) Signed: 08/27/2019 5:28:43 PM By: Anthony Ham MD Entered By: Anthony Dunlap on 08/27/2019 10:59:52 -------------------------------------------------------------------------------- Physical Exam Details Patient Name: Date of Service: Anthony Dunlap ID D. 08/27/2019 10:15 A M Medical Record Number: [id number] Patient Account Number: [id number] Date of Birth/Sex: Treating RN: [birth year] (86 y.o. Anthony Dunlap Primary Care Dunlap: Anthony Dunlap Anthony Dunlap: Anthony Dunlap: Anthony Dunlap: Dellia Nims  Gillermina Phy, Sara Chu in Treatment:  4 Constitutional Patient is hypertensive.. Pulse regular and within target range for patient.[name] Respirations regular, non-labored and within target range.. Temperature is normal and within the target range for the patient.[name] Appears in no distress. Cardiovascular Dorsalis pedis pulse palpable on the left but not the right. Posterior tibial not palpable in either area. Notes Wound exam; Right fifth metatarsal head some debris on the surface I did not remove this I think this generally looks better. There is no probing depth He did not have anything that look like cellulitis on the right fifth toe also better since last week. The left heel still has debris on the surface a large wound I did not debride this today. He does have a palpable dorsalis pedis pulse on the left The right heel has a completely necrotic cover over 60 to 70% of the wound I removed this with a 15 scalpel and pickups. I used a #5 curette on the wound surface. Electronic Signature(s) Signed: 08/27/2019 5:28:43 PM By: Anthony Ham MD Entered By: Anthony Dunlap on 08/27/2019 11:01:58 -------------------------------------------------------------------------------- Physician Orders Details Patient Name: Date of Service: Anthony Dunlap ID D. 08/27/2019 10:15 A M Medical Record Number: [id number] Patient Account Number: [id number] Date of Birth/Sex: Treating RN: [birth year] (86 y.o. 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 Dressing Change Frequency Change dressing every day. - all wounds Wound Cleansing Clean wound with Normal Saline. - or wound cleanser - all wounds Primary Wound Dressing Wound #1 Left Calcaneus Santyl Ointment - apply lightly moistened saline gauze over santyl Wound #2 Right Calcaneus Santyl Ointment - apply lightly moistened saline gauze over santyl Wound #3 Right T Fifth oe Santyl Ointment - apply lightly moistened saline gauze over santyl Wound #4 Right,Lateral Foot Santyl Ointment - apply lightly moistened saline gauze over santyl Secondary Dressing Wound #1 Left Calcaneus Kerlix/Rolled Gauze Dry Gauze Heel Cup Wound #2 Right Calcaneus Kerlix/Rolled Gauze Dry Gauze Heel Cup Wound #3 Right T Fifth oe Kerlix/Rolled Gauze Dry Gauze Wound #4 Right,Lateral Foot Kerlix/Rolled Gauze Dry Gauze Off-Loading Anthony: - offloading heel sandal to both [facility] skilled [facility] for wound care. - [facility] naerobe culture (MICRO) - right calcaneus - (ICD10 E11.621 - Type 2 diabetes mellitus with foot Bacteria identified in Unspecified specimen by A ulcer) LOINC Code: 010-2 Convenience Name: Anerobic culture Electronic Signature(s) Signed: 08/27/2019 5:28:43 PM By: Anthony Ham MD Signed: 08/31/2019 5:04:17 PM By: Anthony Hurst RN, BSN Entered By: Anthony Dunlap on 08/27/2019 10:56:39 -------------------------------------------------------------------------------- Problem List Details Patient Name: Date of Service: Anthony Dunlap ID D. 08/27/2019 10:15 A M Medical Record Number: [id number] Patient Account Number: [id number] Date of Birth/Sex: Treating RN: Feb 05, 1934 (86 y.o. 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 08/27/2019 10:15 A M Medical Record Number: [id number] Patient Account Number: [id number] Date of Birth/Sex: Treating RN: [birth year] (86 y.o. Anthony Dunlap Primary Care Dunlap: Anthony Dunlap Anthony Dunlap: Anthony Dunlap: Anthony Dunlap: Anthony Dunlap in Treatment: 4 Subjective History of Present Illness (HPI) ADMISSION 07/24/2019 This is an 86 year old man who has a complicated past medical history however he is a type II diabetic. He recently had a right total hip replacement was hospitalized from 4/23 through 4/27. He was discharged to [facility] in [place] and now has returned to his home in [place]. He comes in today with his caregiver. Apparently he had the open wound on the right lateral fifth metatarsal head even before he had the hip surgery but did not have any Anthony wounds. He has returned home with 2 large probable pressure areas in both heels. They have home health they have been using Medihoney however it has been recently  suggested the use Santyl prescribed by Anthony Dunlap The patient has an unstageable wound on the right calcaneus and a least a stage III pressure ulcer on the left. The area on the right fifth metatarsal head also has a necrotic surface and he has a small slitlike area just above the right fifth metatarsal head which seems more superficial. He does not have a known history of PAD. 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 He also had an abscess on his thigh with wound culture showing Enterococcus and Klebsiella. He required admission from 05/08/18 through 05/14/2018 with Candida albicans sepsis which was treated. He was readmitted again from 08/22/2018 through 09/14/2018 with again pubic symphysis abscess. He is now on chronic Augmentin because of this and followed by Anthony Dunlap ABIs in our clinic were 0.71 in the right and 0.78 on the left 08/07/19; The patient's bilateral foot x-rays were negative without significant bony destruction. Arterial evaluation showed an ABI on the right at 0.78 there was no flow to the great toe. Triphasic waveforms at the PTA and monophasic at the dorsalis pedis Doppler flows showed triphasic waveforms in the thigh at the CFA, DFA, SFA popliteal. The ATA was occluded biphasic at the PTA. On the left ABI was better at 0.97. TBI of 0.54. Triphasic waveforms at the ATA monophasic at the PTA she did have triphasic waveforms at the left at the popliteal, tibial peroneal trunk and ATA but the PTA was occluded or absent. The patient is definitely going to need to see vascular surgery. We have continued with Santyl to the wounds the patient has which includes 2 on the lateral part of the right fifth toe, lateral part of the right fifth met head. Large pressure ulcers over  both heels. 7/1; this patient was looked at urgently by Anthony Dunlap of  vein and vascular. This is for critical limb ischemia with wounds of the bilateral lower extremities. He has wounds on his bilateral heels as well as a punched-out wound on the right fifth lateral metatarsal head. Notable that he had green drainage coming out of the area on the right fifth metatarsal head. Since he was last here he saw Anthony Dunlap on 6/22 for angiogram. On the right the patient had a patent common femoral profunda, SFA as well as the above- knee and below-knee popliteal artery. 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 The anterior tibial was occluded posterior tibial occluded except for a small island that reconstituted off a collateral from the peroneal. The peroneal was the dominant runoff in the right lower extremity but occluded in the mid calf with a large collateral that filled a small island of the posterior tibial artery. On the left he had a patent common femoral profunda SFA as well as the above and below-knee popliteal artery. Dominant runoff in the left lower extremity was. The anterior tibial but that had at least 2-3 high-grade stenosis posterior tibial was occluded peroneal reconstituted through a runoff of the anterior tibial but this was also occluded. It was not felt that he had any options in the right lower extremity he recommended a below-knee amputation. He had a repeat angiogram on 6/30 he had a left anterior tibial angioplasty and an unsuccessful anterior grade recanalization of the peroneal artery. The patient is having significant pain predominantly in the right lower extremity. Also noted to have green drainage coming from the right lateral fifth metatarsal head. 7/8; the patient has a large wound on the right heel laterally and the right lateral fifth metatarsal head. Last week there was green drainage coming out of the wound on the right heel. I empirically  put him on cefdinir without recognizing that he was already on Augmentin. Dr. Lucianne Lei dam apparently over the phone change this to doxycycline I have not altered this. There is an odor today still some green drainage. He also has an area on the left heel. We have been using Santyl to all wound areas. He does not have a revascularization option on the right. He is status post recent left anterior tibial angioplasty Objective Constitutional Patient is hypertensive.. Pulse regular and within target range for patient.[name] Respirations regular, non-labored and within target range.. Temperature is normal and within the target range for the patient.[name] Appears in no distress. Vitals Time Taken: 10:20 AM, Height: 68 in, Weight: 172 lbs, BMI: 26.1, Temperature: 98.4 F, Pulse: 101 bpm, Respiratory Rate: 18 breaths/min, Blood Pressure: 157/82 mmHg, Capillary Blood Glucose: 190 mg/dl. General Notes: patient stated last CBG was 190 Cardiovascular Dorsalis pedis pulse palpable on the left but not the right. Posterior tibial not palpable in either area. General Notes: Wound exam; ooRight fifth metatarsal head some debris on the surface I did not remove this I think this generally looks better. There is no probing depth ooHe did not have anything that look like cellulitis on the right fifth toe also better since last week. ooThe left heel still has debris on the surface a large wound I did not debride this today. He does have a palpable dorsalis pedis pulse on the left ooThe right heel has a completely necrotic cover over 60 to 70% of the wound I removed this with a 15 scalpel and pickups. I used a #5 curette on the wound surface. Integumentary (Hair, Skin) Wound #1 status is  Open. Original cause of wound was Gradually Appeared. The wound is located on the Left Calcaneus. The wound measures 5.6cm length x 3.7cm width x 0.7cm depth; 16.273cm^2 area and 11.391cm^3 volume. There is Fat Layer (Subcutaneous Tissue)  Exposed exposed. There is no tunneling or undermining noted. There is a large amount of purulent drainage noted. The wound margin is distinct with the outline attached to the wound base. There is small (1-33%) pink granulation within the wound bed. There is a large (67-100%) amount of necrotic tissue within the wound bed including Eschar and Adherent Slough. Wound #2 status is Open. Original cause of wound was Gradually Appeared. The wound is located on the Right Calcaneus. The wound measures 5.2cm length x 5cm width x 1cm depth; 20.42cm^2 area and 20.42cm^3 volume. There is Fat Layer (Subcutaneous Tissue) Exposed exposed. There is no tunneling noted, however, there is undermining starting at 4:00 and ending at 9:00 with a maximum distance of 0.4cm. There is a large amount of purulent drainage noted. Foul odor after cleansing was noted. The wound margin is distinct with the outline attached to the wound base. There is small (1-33%) pink granulation within the wound bed. There is a large (67-100%) amount of necrotic tissue within the wound bed including Eschar and Adherent Slough. Wound #3 status is Open. Original cause of wound was Gradually Appeared. The wound is located on the Right T Fifth. The wound measures 0.2cm length x oe 0.5cm width x 0.2cm depth; 0.079cm^2 area and 0.016cm^3 volume. There is Fat Layer (Subcutaneous Tissue) Exposed exposed. There is no tunneling or undermining noted. There is a small amount of serosanguineous drainage noted. The wound margin is flat and intact. There is small (1-33%) pale granulation within the wound bed. There is a large (67-100%) amount of necrotic tissue within the wound bed including Adherent Slough. Wound #4 status is Open. Original cause of wound was Gradually Appeared. The wound is located on the Right,Lateral Foot. The wound measures 1.1cm length x 1.4cm width x 0.9cm depth; 1.21cm^2 area and 1.089cm^3 volume. There is Fat Layer (Subcutaneous Tissue)  Exposed exposed. There is no tunneling noted, however, there is undermining starting at 11:00 and ending at 2:00 with a maximum distance of 0.2cm. There is a medium amount of purulent drainage noted. Foul odor after cleansing was noted. The wound margin is distinct with the outline attached to the wound base. There is small (1-33%) pink granulation within the wound bed. There is a large (67-100%) amount of necrotic tissue within the wound bed including Adherent Slough. 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 There was a Excisional Skin/Subcutaneous Tissue Debridement with a total area of 9 sq cm performed by Anthony Dunlap., MD. With the following instrument(s): Blade, and Forceps to remove Non-Viable tissue/material. Material removed includes Eschar, Subcutaneous Tissue, and Slough. 1 specimen was taken by a Swab and sent to the lab per facility protocol. A time out was conducted at 10:49, prior to the start of the procedure. A Minimum amount of bleeding was controlled with Pressure. The procedure was tolerated well with a pain level of 0 throughout and a  pain level of 0 following the procedure. Post Debridement Measurements: 5.2cm length x 5cm width x 1cm depth; 20.42cm^3 volume. Post debridement Stage noted as Unstageable/Unclassified. Character of Wound/Ulcer Post Debridement requires further debridement. Post procedure Diagnosis Wound #2: Same as Pre-Procedure Plan Follow-up Appointments: Return Appointment in 2 weeks. Dressing Change  Frequency: Change dressing every day. - all wounds Wound Cleansing: Clean wound with Normal Saline. - or wound cleanser - all wounds Primary Wound Dressing: Wound #1 Left Calcaneus: Santyl Ointment - apply lightly moistened saline gauze over santyl Wound #2 Right Calcaneus: Santyl Ointment - apply lightly moistened saline gauze over santyl Wound #3 Right T Fifth: oe Santyl Ointment - apply lightly moistened saline gauze over santyl Wound #4 Right,Lateral Foot: Santyl Ointment - apply lightly moistened saline gauze over santyl Secondary Dressing: Wound #1 Left Calcaneus: Kerlix/Rolled Gauze Dry Gauze Heel Cup Wound #2 Right Calcaneus: Kerlix/Rolled Gauze Dry Gauze Heel Cup Wound #3 Right T Fifth: oe Kerlix/Rolled Gauze Dry Gauze Wound #4 Right,Lateral Foot: Kerlix/Rolled Gauze Dry Gauze Off-Loading: Anthony: - offloading heel sandal to both feet Home Health: [facility] for wound care. - [facility] ordered were: Anerobic culture - right calcaneus 1. Continue Santyl to all wounds 2. 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 He does not seem to be in a lot of pain. I was concerned about the green drainage last time I cultured the right heel post debridement. No evidence of infection on the right fifth toe which was also a worry last time. I was not aware he was on Augmentin I will need to recheck my notes on this. 3. Now on doxycycline per Dr. Lucianne Lei dam 4. Follow-up 2 weeks Electronic Signature(s) Signed: 08/27/2019 11:05:36 AM By: Anthony Ham MD Entered By: Anthony Dunlap on 08/27/2019 11:05:35 -------------------------------------------------------------------------------- SuperBill Details Patient Name: Date of Service: Anthony Dunlap ID D. 08/27/2019 Medical Record Number: [id number] Patient Account Number: [id number] Date of Birth/Sex: Treating RN: Jul 05, 1933 (86 y.o. 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

## 2019-09-03 ENCOUNTER — Ambulatory Visit: Payer: Medicare Other | Admitting: Infectious Disease

## 2019-09-10 ENCOUNTER — Encounter (HOSPITAL_BASED_OUTPATIENT_CLINIC_OR_DEPARTMENT_OTHER): Payer: Medicare Other | Admitting: Internal Medicine

## 2019-09-10 ENCOUNTER — Other Ambulatory Visit: Payer: Self-pay

## 2019-09-10 DIAGNOSIS — I70229 Atherosclerosis of native arteries of extremities with rest pain, unspecified extremity: Secondary | ICD-10-CM

## 2019-09-10 DIAGNOSIS — E11621 Type 2 diabetes mellitus with foot ulcer: Secondary | ICD-10-CM | POA: Diagnosis not present

## 2019-09-10 NOTE — Progress Notes (Signed)
Anthony Dunlap ([id number]) , Visit Report for 09/10/2019 Debridement Details Patient Name: Date of Service: Anthony, [name] 09/10/2019 11:00 A M Medical Record Number: [id number] Patient Account Number: [id number] Date of Birth/Sex: Treating RN: September 18, 1933 (86 y.o. Anthony Dunlap Primary Care Provider: Redmond Dunlap Other Clinician: Referring Provider: Treating Provider/Extender: Anthony Dunlap in Treatment: 6 Debridement Performed for Assessment: Wound #1 Left Calcaneus Performed By: Clinician Anthony Hurst, RN Debridement Type: Chemical/Enzymatic/Mechanical Agent Used: Santyl Level of Consciousness (Pre-procedure): Awake and Alert Pre-procedure Verification/Time Out Yes - 11:35 Taken: Start Time: 11:35 Bleeding: None End Time: 11:35 Procedural Pain: 0 Post Procedural Pain: 0 Response to Treatment: Procedure was tolerated well Level of Consciousness (Post- Awake and Alert procedure): Post Debridement Measurements of Total Wound Length: (cm) 5.2 Stage: Category/Stage III Width: (cm) 5.7 Depth: (cm) 1 Volume: (cm) 23.279 Character of Wound/Ulcer Post Debridement: Requires Further Debridement Post Procedure Diagnosis Same as Pre-procedure Electronic Signature(s) Signed: 09/10/2019 5:02:14 PM By: Anthony Hurst RN, BSN Signed: 09/10/2019 5:29:29 PM By: Anthony Ham MD Entered By: Anthony Dunlap on 09/10/2019 11:37:24 -------------------------------------------------------------------------------- Debridement Details Patient Name: Date of Service: Anthony Dunlap Anthony D. 09/10/2019 11:00 A M Medical Record Number: [id number] Patient Account Number: [id number] Date of Birth/Sex: Treating RN: [birth year] (86 y.o. Anthony Dunlap Primary Care Provider: Redmond Dunlap Other Clinician: Referring Provider: Treating Provider/Extender: Anthony Dunlap in Treatment: 6 Debridement Performed for Assessment: Wound #2 Right  Calcaneus Performed By: Clinician Anthony Hurst, RN Debridement Type: Chemical/Enzymatic/Mechanical Agent Used: Santyl Level of Consciousness (Pre-procedure): Awake and Alert Pre-procedure Verification/Time Out Yes - 11:35 Taken: Start Time: 11:35 Bleeding: None End Time: 11:35 Procedural Pain: 0 Post Procedural Pain: 0 Response to Treatment: Procedure was tolerated well Level of Consciousness (Post- Awake and Alert procedure): Post Debridement Measurements of Total Wound Length: (cm) 5.5 Stage: Unstageable/Unclassified Width: (cm) 4.7 Depth: (cm) 1.1 Volume: (cm) 22.333 Character of Wound/Ulcer Post Debridement: Requires Further Debridement Post Procedure Diagnosis Same as Pre-procedure Electronic Signature(s) Signed: 09/10/2019 5:02:14 PM By: Anthony Hurst RN, BSN Signed: 09/10/2019 5:29:29 PM By: Anthony Ham MD Entered By: Anthony Dunlap on 09/10/2019 11:37:47 -------------------------------------------------------------------------------- Debridement Details Patient Name: Date of Service: Anthony Dunlap Anthony D. 09/10/2019 11:00 A M Medical Record Number: [id number] Patient Account Number: [id number] Date of Birth/Sex: Treating RN: [birth year] (86 y.o. Anthony Dunlap Primary Care Provider: Redmond Dunlap Other Clinician: Referring Provider: Treating Provider/Extender: Anthony Dunlap in Treatment: 6 Debridement Performed for Assessment: Wound #4 Right,Lateral Foot Performed By: Clinician Anthony Hurst, RN Debridement Type: Chemical/Enzymatic/Mechanical Agent Used: Santyl Severity of Tissue Pre Debridement: Fat layer exposed Level of Consciousness (Pre-procedure): Awake and Alert Pre-procedure Verification/Time Out Yes - 11:35 Taken: Start Time: 11:35 Bleeding: None End Time: 11:35 Procedural Pain: 0 Post Procedural Pain: 0 Response to Treatment: Procedure was tolerated well Level of Consciousness (Post- Awake and  Alert procedure): Post Debridement Measurements of Total Wound Length: (cm) 1.1 Width: (cm) 1.2 Depth: (cm) 0.3 Volume: (cm) 0.311 Character of Wound/Ulcer Post Debridement: Requires Further Debridement Severity of Tissue Post Debridement: Fat layer exposed Post Procedure Diagnosis Same as Pre-procedure Electronic Signature(s) Signed: 09/10/2019 5:02:14 PM By: Anthony Hurst RN, BSN Signed: 09/10/2019 5:29:29 PM By: Anthony Ham MD Entered By: Anthony Dunlap on 09/10/2019 11:38:28 -------------------------------------------------------------------------------- HPI Details Patient Name: Date of Service: Anthony Dunlap Anthony D. 09/10/2019 11:00 A M Medical Record Number: [id number] Patient Account Number: [id number] Date of Birth/Sex: Treating RN: [birth year] (86 y.o. Anthony Dunlap Primary Care  Provider: Redmond Dunlap Other Clinician: Referring Provider: Treating Provider/Extender: Anthony Dunlap in Treatment: 6 History of Present Illness HPI Description: ADMISSION 07/24/2019 This is an 86 year old man who has a complicated past medical history however he is a type II diabetic. He recently had a right total hip replacement was hospitalized from 4/23 through 4/27. He was discharged to [facility] in [place] and now has returned to his home in [place]. He comes in today with his caregiver. Apparently he had the open wound on the right lateral fifth metatarsal head even before he had the hip surgery but did not have any other wounds. He has returned home with 2 large probable pressure areas in both heels. They have home health they have been using Medihoney however it has been recently suggested the use Santyl prescribed by Dr. Gerarda Fraction The patient has an unstageable wound on the right calcaneus and a least a stage III pressure ulcer on the left. The area on the right fifth metatarsal head also has a necrotic surface and he has a small slitlike area just above the right  fifth metatarsal head which seems more superficial. He does not have a known history of PAD. 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 He also had an abscess on his thigh with wound culture showing Enterococcus and Klebsiella. He required admission from 05/08/18 through 05/14/2018 with Candida albicans sepsis which was treated. He was readmitted again from 08/22/2018 through 09/14/2018 with again pubic symphysis abscess. He is now on chronic Augmentin because of this and followed by [name] ABIs in our clinic were 0.71 in the right and 0.78 on the left 08/07/19; The patient's bilateral foot x-rays were negative without significant bony destruction. Arterial evaluation showed an ABI on the right at 0.78 there was no flow to the great toe. Triphasic waveforms at the PTA and monophasic at the dorsalis pedis Doppler flows showed triphasic waveforms in the thigh at the CFA, DFA, SFA popliteal. The ATA was occluded biphasic at the PTA. On the left ABI was better at 0.97. TBI of 0.54. Triphasic waveforms at the ATA monophasic at the PTA she did have triphasic waveforms at the left at the popliteal, tibial peroneal trunk and ATA but the PTA was occluded or absent. The patient is definitely going to need to see vascular surgery. We have continued with Santyl to the wounds the patient has which includes 2 on the lateral part of the right fifth toe, lateral part of the right fifth met head. Large pressure ulcers over both heels. 7/1; this patient was looked at urgently by [name] of vein and vascular. This is for critical limb ischemia with wounds of the bilateral lower extremities. He has wounds on his bilateral heels as well as a punched-out wound on the right fifth lateral metatarsal head.  Notable that he had green drainage coming out of the area on the right fifth metatarsal head. Since he was last here he saw [name] on 6/22 for angiogram. On the right the patient had a patent common femoral profunda, SFA as well as the above- knee and below-knee popliteal artery. 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 The anterior tibial was occluded posterior tibial occluded except for a small island that reconstituted off a collateral from the peroneal. The peroneal  was the dominant runoff in the right lower extremity but occluded in the mid calf with a large collateral that filled a small island of the posterior tibial artery. On the left he had a patent common femoral profunda SFA as well as the above and below-knee popliteal artery. Dominant runoff in the left lower extremity was. The anterior tibial but that had at least 2-3 high-grade stenosis posterior tibial was occluded peroneal reconstituted through a runoff of the anterior tibial but this was also occluded. It was not felt that he had any options in the right lower extremity he recommended a below-knee amputation. He had a repeat angiogram on 6/30 he had a left anterior tibial angioplasty and an unsuccessful anterior grade recanalization of the peroneal artery. The patient is having significant pain predominantly in the right lower extremity. Also noted to have green drainage coming from the right lateral fifth metatarsal head. 7/8; the patient has a large wound on the right heel laterally and the right lateral fifth metatarsal head. Last week there was green drainage coming out of the wound on the right heel. I empirically put him on cefdinir without recognizing that he was already on Augmentin. Dr. Lucianne Lei dam apparently over the phone change this to doxycycline I have not altered this. There is an odor today still some green drainage. He also has an area on the left heel. We have been using Santyl to  all wound areas. He does not have a revascularization option on the right. He is status post recent left anterior tibial angioplasty 7/22; 2-week follow-up. He has follow-up noninvasive vascular studies in early August. He is on doxycycline as prescribed by [name]. We have been using Santyl to the area on his bilateral heels. Both of them have necrotic material however aggressive debridement does not seem to help. Last visit I remove the necrotic material on the right it is back today also the left leaves most of the wound bed covered in thick adherent slough Electronic Signature(s) Signed: 09/10/2019 5:29:29 PM By: Anthony Ham MD Entered By: Anthony Dunlap on 09/10/2019 12:12:41 -------------------------------------------------------------------------------- Physical Exam Details Patient Name: Date of Service: Anthony Dunlap Anthony D. 09/10/2019 11:00 A M Medical Record Number: [id number] Patient Account Number: [id number] Date of Birth/Sex: Treating RN: [birth year] (86 y.o. Anthony Dunlap Primary Care Provider: Redmond Dunlap Other Clinician: Referring Provider: Treating Provider/Extender: Anthony Dunlap in Treatment: 6 Constitutional Patient is hypertensive.. Pulse regular and within target range for patient.[name] Respirations regular, non-labored and within target range.. Temperature is normal and within the target range for the patient.[name] Appears in no distress. Cardiovascular Pedal pulses absent bilaterally.[name] Psychiatric appears at normal baseline. Notes Wound exam Right fifth metatarsal head again a small punched out area with debris in the surface no debridement Right heel about 60% covered in loose slough. Aggressive debridement has not really worked here Similarly on the left heel almost mirror image about 60% slough. No evidence of infection on either side Electronic Signature(s) Signed: 09/10/2019 5:29:29 PM By: Anthony Ham MD Entered By: Anthony Dunlap on 09/10/2019 12:17:38 -------------------------------------------------------------------------------- Physician Orders Details Patient Name: Date of Service: Anthony Dunlap Anthony D. 09/10/2019 11:00 A M Medical Record Number: [id number] Patient Account Number: [id number] Date of Birth/Sex: Treating RN: 04/29/1933 (86 y.o. Anthony Dunlap Primary Care Provider: Redmond Dunlap Other Clinician: Referring Provider: Treating Provider/Extender: Anthony Dunlap in Treatment: 6 Verbal / Phone Orders: No Diagnosis Coding ICD-10 Coding Code Description [phone] Type 2 diabetes  mellitus with foot ulcer E11.51 Type 2 diabetes mellitus with diabetic peripheral angiopathy without gangrene L89.610 Pressure ulcer of right heel, unstageable L89.623 Pressure ulcer of left heel, stage 3 L97.518 Non-pressure chronic ulcer of other part of right foot with other specified severity E11.42 Type 2 diabetes mellitus with diabetic polyneuropathy Follow-up Appointments Return appointment in 1 month. Dressing Change Frequency Change dressing every day. - all wounds Wound Cleansing Clean wound with Normal Saline. - or wound cleanser - all wounds Primary Wound Dressing Wound #1 Left Calcaneus Santyl Ointment - apply lightly moistened saline gauze over santyl Wound #2 Right Calcaneus Santyl Ointment - apply lightly moistened saline gauze over santyl Wound #4 Right,Lateral Foot Santyl Ointment - apply lightly moistened saline gauze over santyl Secondary Dressing Wound #1 Left Calcaneus Kerlix/Rolled Gauze Dry Gauze Heel Cup Wound #2 Right Calcaneus Kerlix/Rolled Gauze Dry Gauze Heel Cup Wound #4 Right,Lateral Foot Kerlix/Rolled Gauze Dry Gauze Off-Loading Other: - offloading heel sandal to both [place] skilled nursing for wound care. - [employer], maintenance) Signed: 09/10/2019 5:02:14 PM By: Anthony Hurst RN, BSN Signed: 09/10/2019  5:29:29 PM By: Anthony Ham MD Entered By: Anthony Dunlap on 09/10/2019 11:32:21 -------------------------------------------------------------------------------- Problem List Details Patient Name: Date of Service: Anthony Dunlap Anthony D. 09/10/2019 11:00 A M Medical Record Number: [id number] Patient Account Number: [id number] Date of Birth/Sex: Treating RN: 12-Nov-1933 (86 y.o. Anthony Dunlap Primary Care Provider: Redmond Dunlap Other Clinician: Referring Provider: Treating Provider/Extender: Anthony Dunlap in Treatment: 6 Active Problems ICD-10 Encounter Code Description Active Date MDM Diagnosis E11.621 Type 2 diabetes mellitus with foot ulcer 07/24/2019 No Yes E11.51 Type 2 diabetes mellitus with diabetic peripheral angiopathy without gangrene 07/24/2019 No Yes L89.610 Pressure ulcer of right heel, unstageable 07/24/2019 No Yes L89.623 Pressure ulcer of left heel, stage 3 07/24/2019 No Yes L97.518 Non-pressure chronic ulcer of other part of right foot with other specified 07/24/2019 No Yes severity E11.42 Type 2 diabetes mellitus with diabetic polyneuropathy 07/24/2019 No Yes Inactive Problems Resolved Problems Electronic Signature(s) Signed: 09/10/2019 5:29:29 PM By: Anthony Ham MD Entered By: Anthony Dunlap on 09/10/2019 12:11:11 -------------------------------------------------------------------------------- Progress Note Details Patient Name: Date of Service: Anthony Dunlap Anthony D. 09/10/2019 11:00 A M Medical Record Number: [id number] Patient Account Number: [id number] Date of Birth/Sex: Treating RN: 14-Sep-1933 (86 y.o. Anthony Dunlap Primary Care Provider: Redmond Dunlap Other Clinician: Referring Provider: Treating Provider/Extender: Anthony Dunlap in Treatment: 6 Subjective History of Present Illness (HPI) ADMISSION 07/24/2019 This is an 86 year old man who has a complicated past medical history however he is a type II  diabetic. He recently had a right total hip replacement was hospitalized from 4/23 through 4/27. He was discharged to [facility] in [place] and now has returned to his home in [place]. He comes in today with his caregiver. Apparently he had the open wound on the right lateral fifth metatarsal head even before he had the hip surgery but did not have any other wounds. He has returned home with 2 large probable pressure areas in both heels. They have home health they have been using Medihoney however it has been recently suggested the use Santyl prescribed by Dr. Gerarda Fraction The patient has an unstageable wound on the right calcaneus and a least a stage III pressure ulcer on the left. The area on the right fifth metatarsal head also has a necrotic surface and he has a small slitlike area just above the right fifth metatarsal head which  seems more superficial. He does not have a known history of PAD. 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 He also had an abscess on his thigh with wound culture showing Enterococcus and Klebsiella. He required admission from 05/08/18 through 05/14/2018 with Candida albicans sepsis which was treated. He was readmitted again from 08/22/2018 through 09/14/2018 with again pubic symphysis abscess. He is now on chronic Augmentin because of this and followed by [name] ABIs in our clinic were 0.71 in the right and 0.78 on the left 08/07/19; The patient's bilateral foot x-rays were negative without significant bony destruction. Arterial evaluation showed an ABI on the right at 0.78 there was no flow to the great toe. Triphasic waveforms at the PTA and monophasic at the dorsalis pedis Doppler flows showed triphasic waveforms in the thigh at the CFA, DFA, SFA popliteal. The  ATA was occluded biphasic at the PTA. On the left ABI was better at 0.97. TBI of 0.54. Triphasic waveforms at the ATA monophasic at the PTA she did have triphasic waveforms at the left at the popliteal, tibial peroneal trunk and ATA but the PTA was occluded or absent. The patient is definitely going to need to see vascular surgery. We have continued with Santyl to the wounds the patient has which includes 2 on the lateral part of the right fifth toe, lateral part of the right fifth met head. Large pressure ulcers over both heels. 7/1; this patient was looked at urgently by [name] of vein and vascular. This is for critical limb ischemia with wounds of the bilateral lower extremities. He has wounds on his bilateral heels as well as a punched-out wound on the right fifth lateral metatarsal head. Notable that he had green drainage coming out of the area on the right fifth metatarsal head. Since he was last here he saw [name] on 6/22 for angiogram. On the right the patient had a patent common femoral profunda, SFA as well as the above- knee and below-knee popliteal artery. 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 The anterior tibial was occluded posterior tibial occluded except for a small island that reconstituted off a collateral from the peroneal. The peroneal was the dominant runoff in the right lower extremity but occluded in the mid calf with a large collateral that filled a small island of the posterior tibial artery. On the left he had a patent common femoral profunda SFA as well as the above and below-knee popliteal artery. Dominant runoff in the left lower extremity was. The anterior tibial but that had at least 2-3 high-grade stenosis posterior tibial was occluded peroneal reconstituted through a runoff of the anterior tibial but this was also occluded. It was not felt that he had any options in the right lower extremity he recommended a below-knee  amputation. He had a repeat angiogram on 6/30 he had a left anterior tibial angioplasty and an unsuccessful anterior grade recanalization of the peroneal artery. The patient is having significant pain predominantly in the right lower extremity. Also noted to have green drainage coming from the right lateral fifth metatarsal head. 7/8; the patient has a large wound on the right heel laterally and the right lateral fifth metatarsal head. Last week there was green drainage coming out of the wound on the right heel. I empirically put him  on cefdinir without recognizing that he was already on Augmentin. Dr. Lucianne Lei dam apparently over the phone change this to doxycycline I have not altered this. There is an odor today still some green drainage. He also has an area on the left heel. We have been using Santyl to all wound areas. He does not have a revascularization option on the right. He is status post recent left anterior tibial angioplasty 7/22; 2-week follow-up. He has follow-up noninvasive vascular studies in early August. He is on doxycycline as prescribed by [name]. We have been using Santyl to the area on his bilateral heels. Both of them have necrotic material however aggressive debridement does not seem to help. Last visit I remove the necrotic material on the right it is back today also the left leaves most of the wound bed covered in thick adherent slough Objective Constitutional Patient is hypertensive.. Pulse regular and within target range for patient.[name] Respirations regular, non-labored and within target range.. Temperature is normal and within the target range for the patient.[name] Appears in no distress. Vitals Time Taken: 10:58 AM, Height: 68 in, Weight: 172 lbs, BMI: 26.1, Temperature: 98.4 F, Pulse: 114 bpm, Respiratory Rate: 19 breaths/min, Blood Pressure: 168/86 mmHg, Capillary Blood Glucose: 191 mg/dl. General Notes: last CBG per patient was 191 Cardiovascular Pedal pulses absent  bilaterally.[name] Psychiatric appears at normal baseline. General Notes: Wound exam ooRight fifth metatarsal head again a small punched out area with debris in the surface no debridement ooRight heel about 60% covered in loose slough. Aggressive debridement has not really worked here ooSimilarly on the left heel almost mirror image about 60% slough. ooNo evidence of infection on either side Integumentary (Hair, Skin) Wound #1 status is Open. Original cause of wound was Gradually Appeared. The wound is located on the Left Calcaneus. The wound measures 5.2cm length x 5.7cm width x 1cm depth; 23.279cm^2 area and 23.279cm^3 volume. There is Fat Layer (Subcutaneous Tissue) Exposed exposed. There is no tunneling or undermining noted. There is a large amount of purulent drainage noted. The wound margin is well defined and not attached to the wound base. There is medium (34-66%) pink granulation within the wound bed. There is a medium (34-66%) amount of necrotic tissue within the wound bed including Eschar and Adherent Slough. Wound #2 status is Open. Original cause of wound was Gradually Appeared. The wound is located on the Right Calcaneus. The wound measures 5.5cm length x 4.7cm width x 1.1cm depth; 20.303cm^2 area and 22.333cm^3 volume. There is Fat Layer (Subcutaneous Tissue) Exposed exposed. There is no tunneling noted, however, there is undermining starting at 4:00 and ending at 6:00 with a maximum distance of 0.5cm. There is a large amount of purulent drainage noted. Foul odor after cleansing was noted. The wound margin is well defined and not attached to the wound base. There is medium (34-66%) pink granulation within the wound bed. There is a medium (34-66%) amount of necrotic tissue within the wound bed including Eschar and Adherent Slough. Wound #3 status is Healed - Epithelialized. Original cause of wound was Gradually Appeared. The wound is located on the Right T Fifth. The wound  measures oe 0cm length x 0cm width x 0cm depth; 0cm^2 area and 0cm^3 volume. There is no tunneling or undermining noted. There is a none present amount of drainage noted. The wound margin is flat and intact. There is no granulation within the wound bed. There is no necrotic tissue within the wound bed. Wound #4 status is Open. Original  cause of wound was Gradually Appeared. The wound is located on the Right,Lateral Foot. The wound measures 1.1cm length x 1.2cm width x 0.3cm depth; 1.037cm^2 area and 0.311cm^3 volume. There is Fat Layer (Subcutaneous Tissue) Exposed exposed. There is no tunneling noted, however, there is undermining starting at 10:00 and ending at 11:00 with a maximum distance of 0.2cm. There is a medium amount of purulent drainage noted. Foul odor after cleansing was noted. The wound margin is well defined and not attached to the wound base. There is medium (34-66%) pink granulation within the wound bed. There is a medium (34-66%) amount of necrotic tissue within the wound bed including Adherent Slough. 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 There was a Chemical/Enzymatic/Mechanical debridement performed by Anthony Hurst, RN.[name] Agent used was [employer]. A time out was conducted at 11:35, prior to the start of the procedure. There was no bleeding. The procedure was tolerated well with a pain level of 0 throughout and a pain level of 0 following the procedure. Post Debridement Measurements: 5.2cm length x 5.7cm width x 1cm depth; 23.279cm^3 volume.  Post debridement Stage noted as Category/Stage III. Character of Wound/Ulcer Post Debridement requires further debridement. Post procedure Diagnosis Wound #1: Same as Pre-Procedure Wound #2 Pre-procedure diagnosis of Wound #2 is a Pressure Ulcer located on the Right Calcaneus . There was a Chemical/Enzymatic/Mechanical debridement performed by Anthony Hurst, RN.[name] Agent used was [employer]. A time out was conducted at 11:35, prior to the start of the procedure. There was no bleeding. The procedure was tolerated well with a pain level of 0 throughout and a pain level of 0 following the procedure. Post Debridement Measurements: 5.5cm length x 4.7cm width x 1.1cm depth; 22.333cm^3 volume. Post debridement Stage noted as Unstageable/Unclassified. Character of Wound/Ulcer Post Debridement requires further debridement. Post procedure Diagnosis Wound #2: Same as Pre-Procedure Wound #4 Pre-procedure diagnosis of Wound #4 is a Diabetic Wound/Ulcer of the Lower Extremity located on the Right,Lateral Foot .Severity of Tissue Pre Debridement is: Fat layer exposed. There was a Chemical/Enzymatic/Mechanical debridement performed by Anthony Hurst, RN.[name] Agent used was [employer]. A time out was conducted at 11:35, prior to the start of the procedure. There was no bleeding. The procedure was tolerated well with a pain level of 0 throughout and a pain level of 0 following the procedure. Post Debridement Measurements: 1.1cm length x 1.2cm width x 0.3cm depth; 0.311cm^3 volume. Character of Wound/Ulcer Post Debridement requires further debridement. Severity of Tissue Post Debridement is: Fat layer exposed. Post procedure Diagnosis Wound #4: Same as Pre-Procedure Plan Follow-up Appointments: Return appointment in 1 month. Dressing Change Frequency: Change dressing every day. - all wounds Wound Cleansing: Clean wound with Normal Saline. - or wound cleanser - all wounds Primary Wound Dressing: Wound #1 Left  Calcaneus: Santyl Ointment - apply lightly moistened saline gauze over santyl Wound #2 Right Calcaneus: Santyl Ointment - apply lightly moistened saline gauze over santyl Wound #4 Right,Lateral Foot: Santyl Ointment - apply lightly moistened saline gauze over santyl Secondary Dressing: Wound #1 Left Calcaneus: Kerlix/Rolled Gauze Dry Gauze Heel Cup Wound #2 Right Calcaneus: Kerlix/Rolled Gauze Dry Gauze Heel  Cup Wound #4 Right,Lateral Foot: Kerlix/Rolled Gauze Dry Gauze Off-Loading: Other: - offloading heel sandal to both feet Home Health: [facility] for wound care. - [facility] I did not aggressively debride any of these wounds today 2. He has arterial study repeats in early August but previously there was not felt to be any options for revascularization at least on the right. 3. Fortunately he is not in a lot of pain and the wounds are not infected. There is not an indication for amputation at this point. I will look at the arterial studies when they are available and consider what her alternatives are for wound care if any Electronic Signature(s) Signed: 09/10/2019 5:29:29 PM By: Anthony Ham MD Entered By: Anthony Dunlap on 09/10/2019 12:19:20 -------------------------------------------------------------------------------- SuperBill Details Patient Name: Date of Service: Anthony Dunlap Anthony D. 09/10/2019 Medical Record Number: [id number] Patient Account Number: [id number] Date of Birth/Sex: Treating RN: 10-25-1933 (86 y.o. Anthony Dunlap Primary Care Provider: [employer] Dunlap Other Clinician: Referring Provider: Treating Provider/Extender: Anthony Dunlap in Treatment: 6 Diagnosis Coding ICD-10 Codes Code Description [phone] Type 2 diabetes mellitus with foot ulcer E11.51 Type 2 diabetes mellitus with diabetic peripheral angiopathy without gangrene L89.610 Pressure ulcer of right heel, unstageable L89.623 Pressure  ulcer of left heel, stage 3 L97.518 Non-pressure chronic ulcer of other part of right foot with other specified severity E11.42 Type 2 diabetes mellitus with diabetic polyneuropathy Facility Procedures CPT4 Code: [id number] Description: 01601 - DEBRIDE W/O ANES NON SELECT Modifier: Quantity: 1 Physician Procedures : CPT4 Code Description Modifier [id number] - WC PHYS LEVEL 3 - EST PT ICD-10 Diagnosis Description L89.610 Pressure ulcer of right heel, unstageable L89.623 Pressure ulcer of left heel, stage 3 E11.51 Type 2 diabetes mellitus with diabetic peripheral  angiopathy without gangrene Quantity: 1 Electronic Signature(s) Signed: 09/10/2019 5:02:14 PM By: Anthony Hurst RN, BSN Signed: 09/10/2019 5:29:29 PM By: Anthony Ham MD Entered By: Anthony Dunlap on 09/10/2019 13:50:14

## 2019-09-10 NOTE — Progress Notes (Addendum)
Anthony Dunlap ([id number]) , Visit Report for 09/10/2019 Arrival Information Details Patient Name: Date of Service: Anthony Dunlap, Anthony D. 09/10/2019 11:00 A M Medical Record Number: [id number] Patient Account Number: [id number] Date of Birth/Sex: Treating RN: [birth year] (86 y.o. [name] Primary Care Zamariyah Furukawa: [name] Other Clinician: Referring Keondre Markson: Treating Shameek Nyquist/Extender: Garfield Cornea in Treatment: 6 Visit Information History Since Last Visit Added or deleted any medications: No Patient Arrived: [name] Any new allergies or adverse reactions: No Arrival Time: 10:58 Had a fall or experienced change in No Accompanied By: family friend activities of daily living that may affect Transfer Assistance: Anthony Dunlap risk of falls: Patient Identification Verified: Yes Signs or symptoms of abuse/neglect since last visito No Secondary Verification Process Completed: Yes Hospitalized since last visit: No Patient Has Alerts: No Implantable device outside of the clinic excluding No cellular tissue based products placed in the center since last visit: Has Dressing in Place as Prescribed: Yes Pain Present Now: No Electronic Signature(s) Signed: 09/10/2019 4:44:31 PM By: Kela Millin Entered By: Kela Millin on 09/10/2019 10:58:36 -------------------------------------------------------------------------------- Lower Extremity Assessment Details Patient Name: Date of Service: Anthony Longest ID D. 09/10/2019 11:00 A M Medical Record Number: [id number] Patient Account Number: [id number] Date of Birth/Sex: Treating RN: 12-Oct-1933 (85 y.o. [name] Primary Care Janera Peugh: [name] Other Clinician: Referring Lynnea Vandervoort: Treating Leanora Murin/Extender: Garfield Cornea in Treatment: 6 Edema Assessment Assessed: Shirlyn Goltz: No] Patrice Paradise: No] Edema: [Left: No] [Right: No] Calf Left: Right: Point of Measurement: 42 cm  From Medial Instep 32 cm 32 cm Ankle Left: Right: Point of Measurement: 10 cm From Medial Instep 21 cm 23 cm Vascular Assessment Pulses: Dorsalis Pedis Palpable: [Left:No] [Right:No] Electronic Signature(s) Signed: 09/10/2019 4:44:31 PM By: Kela Millin Entered By: Kela Millin on 09/10/2019 11:00:48 -------------------------------------------------------------------------------- Multi Wound Chart Details Patient Name: Date of Service: Anthony Longest ID D. 09/10/2019 11:00 A M Medical Record Number: [id number] Patient Account Number: [id number] Date of Birth/Sex: Treating RN: 11/05/1933 (86 y.o. [name] Primary Care Caldonia Leap: [name] Other Clinician: Referring Kalley Nicholl: Treating Tita Terhaar/Extender: Garfield Cornea in Treatment: 6 Vital Signs Height(in): 68 Capillary Blood Glucose(mg/dl): 191 Weight(lbs): 172 Pulse(bpm): 114 Body Mass Index(BMI): 26 Blood Pressure(mmHg): 168/86 Temperature(F): 98.4 Respiratory Rate(breaths/min): 19 Photos: [1:No Photos Left Calcaneus] [2:No Photos Right Calcaneus] [3:No Photos Right T Fifth oe] Wound Location: [1:Gradually Appeared] [2:Gradually Appeared] [3:Gradually Appeared] Wounding Event: [1:Pressure Ulcer] [2:Pressure Ulcer] [3:Diabetic Wound/Ulcer of the Lower] Primary Etiology: [1:Hypertension, Type II Diabetes,] [2:Hypertension, Type II Diabetes,] [3:Extremity Hypertension, Type II Diabetes,] Comorbid History: [1:Osteomyelitis, Received Radiation 06/06/2019] [2:Osteomyelitis, Received Radiation 06/06/2019] [3:Osteomyelitis, Received Radiation 06/06/2019] Date Acquired: [1:6] [2:6] [3:6] Weeks of Treatment: [1:Open] [2:Open] [3:Healed - Epithelialized] Wound Status: [1:5.2x5.7x1] [2:5.5x4.7x1.1] [3:0x0x0] Measurements L x W x D (cm) [1:23.279] [2:20.303] [3:0] A (cm) : rea [1:23.279] [2:22.333] [3:0] Volume (cm) : [1:-28.90%] [2:0.60%] [3:100.00%] % Reduction in A rea: [1:-1189.00%]  [2:-993.70%] [3:100.00%] % Reduction in Volume: [2:4] Starting Position 1 (o'clock): [2:6] Ending Position 1 (o'clock): [2:0.5] Maximum Distance 1 (cm): [1:No] [2:Yes] [3:No] Undermining: [1:Category/Stage III] [2:Unstageable/Unclassified] [3:Grade 2] Classification: [1:Large] [2:Large] [3:Anthony Dunlap Present] Exudate A mount: [1:Purulent] [2:Purulent] [3:N/A] Exudate Type: [1:yellow, brown, green] [2:yellow, brown, green] [3:N/A] Exudate Color: [1:No] [2:Yes] [3:No] Foul Odor A Cleansing: [1:fter N/A] [2:No] [3:N/A] Odor A nticipated Due to Product Use: [1:Well defined, not attached] [2:Well defined, not attached] [3:Flat and Intact] Wound Margin: [1:Medium (34-66%)] [2:Medium (34-66%)] [3:Anthony Dunlap Present (0%)] Granulation A mount: [1:Pink] [2:Pink] [3:N/A] Granulation  Quality: [1:Medium (34-66%)] [2:Medium (34-66%)] [3:Anthony Dunlap Present (0%)] Necrotic A mount: [1:Eschar, Adherent Slough] [2:Eschar, Adherent Slough] [3:N/A] Necrotic Tissue: [1:Fat Layer (Subcutaneous Tissue): Yes Fat Layer (Subcutaneous Tissue): Yes Fascia: No] Exposed Structures: [1:Fascia: No Tendon: No Muscle: No Joint: No Bone: No Anthony Dunlap] [2:Fascia: No Tendon: No Muscle: No Joint: No Bone: No Anthony Dunlap] [3:Fat Layer (Subcutaneous Tissue): No Tendon: No Muscle: No Joint: No Bone: No Large (67-100%)] Epithelialization: [1:Chemical/Enzymatic/Mechanical] [2:Chemical/Enzymatic/Mechanical] [3:N/A] Debridement: Pre-procedure Verification/Time Out 11:35 [2:11:35] [3:N/A] Taken: [1:N/A] [2:N/A] [3:N/A] Instrument: [1:Anthony Dunlap] [2:Anthony Dunlap] [3:N/A] Bleeding: [1:0] [2:0] [3:N/A] Procedural Pain: [1:0] [2:0] [3:N/A] Post Procedural Pain: [1:Procedure was tolerated well] [2:Procedure was tolerated well] [3:N/A] Debridement Treatment Response: [1:5.2x5.7x1] [2:5.5x4.7x1.1] [3:N/A] Post Debridement Measurements L x W x D (cm) [1:23.279] [2:22.333] [3:N/A] Post Debridement Volume: (cm) [1:Category/Stage III] [2:Unstageable/Unclassified] [3:N/A] Post  Debridement Stage: [1:Debridement] [2:Debridement] [3:N/A] Wound Number: 4 N/A N/A Photos: No Photos N/A N/A Right, Lateral Foot N/A N/A Wound Location: Gradually Appeared N/A N/A Wounding Event: Diabetic Wound/Ulcer of the Lower N/A N/A Primary Etiology: Extremity Hypertension, Type II Diabetes, N/A N/A Comorbid History: Osteomyelitis, Received Radiation 06/06/2019 N/A N/A Date Acquired: 6 N/A N/A Weeks of Treatment: Open N/A N/A Wound Status: 1.1x1.2x0.3 N/A N/A Measurements L x W x D (cm) 1.037 N/A N/A A (cm) : rea 0.311 N/A N/A Volume (cm) : 7.70% N/A N/A % Reduction in A rea: 30.70% N/A N/A % Reduction in Volume: 10 Starting Position 1 (o'clock): 11 Ending Position 1 (o'clock): 0.2 Maximum Distance 1 (cm): Yes N/A N/A Undermining: Grade 2 N/A N/A Classification: Medium N/A N/A Exudate A mount: Purulent N/A N/A Exudate Type: yellow, brown, green N/A N/A Exudate Color: Yes N/A N/A Foul Odor A Cleansing: fter No N/A N/A Odor A nticipated Due to Product Use: Well defined, not attached N/A N/A Wound Margin: Medium (34-66%) N/A N/A Granulation A mount: Pink N/A N/A Granulation Quality: Medium (34-66%) N/A N/A Necrotic A mount: Adherent Slough N/A N/A Necrotic Tissue: Fat Layer (Subcutaneous Tissue): Yes N/A N/A Exposed Structures: Fascia: No Tendon: No Muscle: No Joint: No Bone: No Anthony Dunlap N/A N/A Epithelialization: Chemical/Enzymatic/Mechanical N/A N/A Debridement: Pre-procedure Verification/Time Out 11:35 N/A N/A Taken: N/A N/A N/A Instrument: Anthony Dunlap N/A N/A Bleeding: 0 N/A N/A Procedural Pain: 0 N/A N/A Post Procedural Pain: Procedure was tolerated well N/A N/A Debridement Treatment Response: 1.1x1.2x0.3 N/A N/A Post Debridement Measurements L x W x D (cm) 0.311 N/A N/A Post Debridement Volume: (cm) N/A N/A N/A Post Debridement Stage: Debridement N/A N/A Procedures Performed: Treatment Notes Electronic Signature(s) Signed:  09/10/2019 5:02:14 PM By: Levan Hurst RN, BSN Signed: 09/10/2019 5:29:29 PM By: Linton Ham MD Entered By: Linton Ham on 09/10/2019 12:11:22 -------------------------------------------------------------------------------- Multi-Disciplinary Care Plan Details Patient Name: Date of Service: Anthony Longest ID D. 09/10/2019 11:00 A M Medical Record Number: [id number] Patient Account Number: [id number] Date of Birth/Sex: Treating RN: 1933-10-20 (86 y.o. [name] Primary Care Zahlia Deshazer: [name] Other Clinician: Referring Aolanis Crispen: Treating Si Jachim/Extender: Garfield Cornea in Treatment: 6 Active Inactive Electronic Signature(s) Signed: 11/10/2019 7:59:49 AM By: Levan Hurst RN, BSN Previous Signature: 09/10/2019 5:02:14 PM Version By: Levan Hurst RN, BSN Entered By: Levan Hurst on 10/13/2019 10:54:37 -------------------------------------------------------------------------------- Pain Assessment Details Patient Name: Date of Service: Anthony Longest ID D. 09/10/2019 11:00 A M Medical Record Number: [id number] Patient Account Number: [id number] Date of Birth/Sex: Treating RN: 09-Dec-1933 (86 y.o. [name] Primary Care Shankar Silber: [name] Other Clinician: Referring Mennie Spiller: Treating Heberto Sturdevant/Extender: Garfield Cornea in Treatment: 6  Active Problems Location of Pain Severity and Description of Pain Patient Has Paino No Site Locations Pain Management and Medication Current Pain Management: Electronic Signature(s) Signed: 09/10/2019 4:44:31 PM By: Kela Millin Entered By: Kela Millin on 09/10/2019 10:59:19 -------------------------------------------------------------------------------- Patient/Caregiver Education Details Patient Name: Date of Service: Anthony Longest ID D. 7/22/2021andnbsp11:00 A M Medical Record Number: [id number] Patient Account Number: [id number] Date of  Birth/Gender: Treating RN: 05-21-1933 (86 y.o. [name] Primary Care Physician: [name] Other Clinician: Referring Physician: Treating Physician/Extender: Garfield Cornea in Treatment: 6 Education Assessment Education Provided To: Patient Education Topics Provided Wound/Skin Impairment: Methods: Explain/Verbal Responses: State content correctly Electronic Signature(s) Signed: 09/10/2019 5:02:14 PM By: Levan Hurst RN, BSN Entered By: Levan Hurst on 09/10/2019 13:49:58 -------------------------------------------------------------------------------- Wound Assessment Details Patient Name: Date of Service: Anthony Longest ID D. 09/10/2019 11:00 A M Medical Record Number: [id number] Patient Account Number: [id number] Date of Birth/Sex: Treating RN: [birth year] (86 y.o. [name] Primary Care Erin Uecker: [name] Other Clinician: Referring Dekker Verga: Treating Cyann Venti/Extender: Garfield Cornea in Treatment: 6 Wound Status Wound Number: 1 Primary Pressure Ulcer Etiology: Wound Location: Left Calcaneus Wound Status: Open Wounding Event: Gradually Appeared Comorbid Hypertension, Type II Diabetes, Osteomyelitis, Received Date Acquired: 06/06/2019 History: Radiation Weeks Of Treatment: 6 Clustered Wound: No Photos Photo Uploaded By: Mikeal Hawthorne on 09/10/2019 15:28:18 Wound Measurements Length: (cm) 5.2 Width: (cm) 5.7 Depth: (cm) 1 Area: (cm) 23.279 Volume: (cm) 23.279 % Reduction in Area: -28.9% % Reduction in Volume: -1189% Epithelialization: Anthony Dunlap Tunneling: No Undermining: No Wound Description Classification: Category/Stage III Wound Margin: Well defined, not attached Exudate Amount: Large Exudate Type: Purulent Exudate Color: yellow, brown, green Foul Odor After Cleansing: No Slough/Fibrino Yes Wound Bed Granulation Amount: Medium (34-66%) Exposed Structure Granulation Quality:  Pink Fascia Exposed: No Necrotic Amount: Medium (34-66%) Fat Layer (Subcutaneous Tissue) Exposed: Yes Necrotic Quality: Eschar, Adherent Slough Tendon Exposed: No Muscle Exposed: No Joint Exposed: No Bone Exposed: No Electronic Signature(s) Signed: 09/10/2019 4:44:31 PM By: Kela Millin Entered By: Kela Millin on 09/10/2019 11:08:41 -------------------------------------------------------------------------------- Wound Assessment Details Patient Name: Date of Service: Anthony Longest ID D. 09/10/2019 11:00 A M Medical Record Number: [id number] Patient Account Number: [id number] Date of Birth/Sex: Treating RN: [birth year] (86 y.o. [name] Primary Care Corayma Cashatt: [name] Other Clinician: Referring Lizzette Carbonell: Treating Alexis Reber/Extender: Garfield Cornea in Treatment: 6 Wound Status Wound Number: 2 Primary Pressure Ulcer Etiology: Wound Location: Right Calcaneus Wound Status: Open Wounding Event: Gradually Appeared Comorbid Hypertension, Type II Diabetes, Osteomyelitis, Received Date Acquired: 06/06/2019 History: Radiation Weeks Of Treatment: 6 Clustered Wound: No Photos Photo Uploaded By: Mikeal Hawthorne on 09/10/2019 15:27:23 Wound Measurements Length: (cm) 5.5 Width: (cm) 4.7 Depth: (cm) 1.1 Area: (cm) 20.303 Volume: (cm) 22.333 % Reduction in Area: 0.6% % Reduction in Volume: -993.7% Epithelialization: Anthony Dunlap Tunneling: No Undermining: Yes Starting Position (o'clock): 4 Ending Position (o'clock): 6 Maximum Distance: (cm) 0.5 Wound Description Classification: Unstageable/Unclassified Wound Margin: Well defined, not attached Exudate Amount: Large Exudate Type: Purulent Exudate Color: yellow, brown, green Foul Odor After Cleansing: Yes Due to Product Use: No Slough/Fibrino Yes Wound Bed Granulation Amount: Medium (34-66%) Exposed Structure Granulation Quality: Pink Fascia Exposed: No Necrotic Amount: Medium  (34-66%) Fat Layer (Subcutaneous Tissue) Exposed: Yes Necrotic Quality: Eschar, Adherent Slough Tendon Exposed: No Muscle Exposed: No Joint Exposed: No Bone Exposed: No Electronic Signature(s) Signed: 09/10/2019 4:44:31 PM By: Kela Millin Entered By: Kela Millin on 09/10/2019 11:09:21 -------------------------------------------------------------------------------- Wound Assessment Details Patient Name:  Date of Service: Anthony Dunlap, Anthony D. 09/10/2019 11:00 A M Medical Record Number: [id number] Patient Account Number: [id number] Date of Birth/Sex: Treating RN: 01-13-1934 (86 y.o. [name] Primary Care Rina Adney: [name] Other Clinician: Referring Jayme Cham: Treating Lennix Rotundo/Extender: Garfield Cornea in Treatment: 6 Wound Status Wound Number: 3 Primary Diabetic Wound/Ulcer of the Lower Extremity Etiology: Wound Location: Right T Fifth oe Wound Status: Healed - Epithelialized Wounding Event: Gradually Appeared Comorbid Hypertension, Type II Diabetes, Osteomyelitis, Received Date Acquired: 06/06/2019 History: Radiation Weeks Of Treatment: 6 Clustered Wound: No Photos Photo Uploaded By: Mikeal Hawthorne on 09/10/2019 15:28:19 Wound Measurements Length: (cm) Width: (cm) Depth: (cm) Area: (cm) Volume: (cm) 0 % Reduction in Area: 100% 0 % Reduction in Volume: 100% 0 Epithelialization: Large (67-100%) 0 Tunneling: No 0 Undermining: No Wound Description Classification: Grade 2 Wound Margin: Flat and Intact Exudate Amount: Anthony Dunlap Present Foul Odor After Cleansing: No Slough/Fibrino No Wound Bed Granulation Amount: Anthony Dunlap Present (0%) Exposed Structure Necrotic Amount: Anthony Dunlap Present (0%) Fascia Exposed: No Fat Layer (Subcutaneous Tissue) Exposed: No Tendon Exposed: No Muscle Exposed: No Joint Exposed: No Bone Exposed: No Electronic Signature(s) Signed: 09/10/2019 4:44:31 PM By: Kela Millin Signed: 09/10/2019 5:02:14  PM By: Levan Hurst RN, BSN Entered By: Levan Hurst on 09/10/2019 11:30:04 -------------------------------------------------------------------------------- Wound Assessment Details Patient Name: Date of Service: Anthony Longest ID D. 09/10/2019 11:00 A M Medical Record Number: [id number] Patient Account Number: [id number] Date of Birth/Sex: Treating RN: 29-Jun-1933 (86 y.o. [name] Primary Care Ilda Laskin: [name] Other Clinician: Referring Doyne Micke: Treating Misao Fackrell/Extender: Garfield Cornea in Treatment: 6 Wound Status Wound Number: 4 Primary Diabetic Wound/Ulcer of the Lower Extremity Etiology: Wound Location: Right, Lateral Foot Wound Status: Open Wounding Event: Gradually Appeared Comorbid Hypertension, Type II Diabetes, Osteomyelitis, Received Date Acquired: 06/06/2019 History: Radiation Weeks Of Treatment: 6 Clustered Wound: No Photos Photo Uploaded By: Mikeal Hawthorne on 09/10/2019 15:27:49 Wound Measurements Length: (cm) 1.1 Width: (cm) 1.2 Depth: (cm) 0.3 Area: (cm) 1.037 Volume: (cm) 0.311 % Reduction in Area: 7.7% % Reduction in Volume: 30.7% Epithelialization: Anthony Dunlap Tunneling: No Undermining: Yes Starting Position (o'clock): 10 Ending Position (o'clock): 11 Maximum Distance: (cm) 0.2 Wound Description Classification: Grade 2 Wound Margin: Well defined, not attached Exudate Amount: Medium Exudate Type: Purulent Exudate Color: yellow, brown, green Foul Odor After Cleansing: Yes Due to Product Use: No Slough/Fibrino Yes Wound Bed Granulation Amount: Medium (34-66%) Exposed Structure Granulation Quality: Pink Fascia Exposed: No Necrotic Amount: Medium (34-66%) Fat Layer (Subcutaneous Tissue) Exposed: Yes Necrotic Quality: Adherent Slough Tendon Exposed: No Muscle Exposed: No Joint Exposed: No Bone Exposed: No Electronic Signature(s) Signed: 09/10/2019 4:44:31 PM By: Kela Millin Entered By: Kela Millin on 09/10/2019 11:10:48 -------------------------------------------------------------------------------- Vitals Details Patient Name: Date of Service: Anthony Longest ID D. 09/10/2019 11:00 A M Medical Record Number: [id number] Patient Account Number: [id number] Date of Birth/Sex: Treating RN: 01-22-1934 (86 y.o. [name] Primary Care Gaudencio Chesnut: [name] Other Clinician: Referring Makinzee Durley: Treating Tiena Manansala/Extender: Garfield Cornea in Treatment: 6 Vital Signs Time Taken: 10:58 Temperature (F): 98.4 Height (in): 68 Pulse (bpm): 114 Weight (lbs): 172 Respiratory Rate (breaths/min): 19 Body Mass Index (BMI): 26.1 Blood Pressure (mmHg): 168/86 Capillary Blood Glucose (mg/dl): 191 Reference Range: 80 - 120 mg / dl Notes last CBG per patient was 191 Electronic Signature(s) Signed: 09/10/2019 4:44:31 PM By: Kela Millin Entered By: Kela Millin on 09/10/2019 10:59:13

## 2019-09-22 ENCOUNTER — Ambulatory Visit (INDEPENDENT_AMBULATORY_CARE_PROVIDER_SITE_OTHER): Payer: Medicare Other | Admitting: Physician Assistant

## 2019-09-22 ENCOUNTER — Encounter: Payer: Self-pay | Admitting: *Deleted

## 2019-09-22 ENCOUNTER — Ambulatory Visit (INDEPENDENT_AMBULATORY_CARE_PROVIDER_SITE_OTHER)
Admission: RE | Admit: 2019-09-22 | Discharge: 2019-09-22 | Disposition: A | Discharge status: Home / Self Care | Payer: Medicare Other | Source: Ambulatory Visit | Attending: Vascular Surgery | Admitting: Vascular Surgery

## 2019-09-22 ENCOUNTER — Other Ambulatory Visit: Payer: Self-pay

## 2019-09-22 VITALS — BP 151/90 | HR 70 | Temp 97.4°F | Resp 20 | Ht 68.0 in | Wt 174.5 lb

## 2019-09-22 DIAGNOSIS — E1152 Type 2 diabetes mellitus with diabetic peripheral angiopathy with gangrene: Secondary | ICD-10-CM | POA: Diagnosis not present

## 2019-09-22 DIAGNOSIS — I998 Other disorder of circulatory system: Secondary | ICD-10-CM

## 2019-09-22 DIAGNOSIS — M86171 Other acute osteomyelitis, right ankle and foot: Secondary | ICD-10-CM | POA: Diagnosis not present

## 2019-09-22 DIAGNOSIS — I70229 Atherosclerosis of native arteries of extremities with rest pain, unspecified extremity: Secondary | ICD-10-CM

## 2019-09-22 NOTE — Progress Notes (Signed)
HISTORY AND PHYSICAL     CC:  follow up. Requesting Provider:  [name], MD  HPI: This is a 86 y.o. male who is here today for follow up for PAD.   Dunlap initially underwent total hip replacement and was hospitalized from 06/12/19-06/16/19 and then discharged to the [facility].  Apparently while at the [facility] he developed these bilateral heel wounds in May of this year.  When he saw [name] in June, he stated  he had since been discharged and was back home and living with his wife and was ambulatory.  [name] took him to the [facility] lab and underwent aortogram, which revealed: Aortogram showed patent renal arteries bilaterally with no flow-limiting stenosis in the aortoiliac segment.  Right lower extremity arteriogram showed a patent common femoral, profunda, SFA, as well as above and below-knee popliteal artery.  He has severe tibial disease in the right lower extremity.  Anterior tibial occluded shortly after takeoff with no distal reconstitution and no identifiable dorsalis pedis in the foot.  Posterior tibial appeared occluded throughout its course except for a small island that reconstituted off a collateral from the peroneal.  Peroneal was the dominant runoff in the right lower extremity but occluded in the mid calf where he then had a large collateral that filled a small island of posterior tibial that was very diseased but then the posterior tibial occluded again at the level of the ankle with a collateral filling the plantar arch.    Left lower extremity arteriogram shows a patent common femoral, profunda, SFA, as well as above and below-knee popliteal artery.  Dominant runoff in the left lower extremity was via the anterior tibial that has at least 2-3 high-grade stenosis.  Posterior tibial appears occluded.  There is a mid peroneal that reconstitutes through a collateral off anterior tibial but this is occluded proximally.  He was then taken to the [facility] lab on 08/19/2019 by  [name] and underwent left ATA angioplasty and unsuccessful antegrade recanalization of the peroneal artery and the findings as follows: Dedicated imaging of the left lower extremity shows single-vessel runoff via anterior tibial artery that had multiple high-grade focal stenosis greater than 75% in the proximal one third of the artery with a fairly robust vessel distally.  The tibioperoneal trunk had a high-grade stenosis in it.  Posterior tibial artery was occluded.  The peroneal artery was occluded proximally and did reconstitute in the mid to distal calf.  Ultimately the anterior tibial artery was selected and balloon angioplasty was performed with a 3 mm Jade balloon throughout the proximal to mid segment with no evidence of residual stenosis and no dissection and excellent flow in the foot that was fairly brisk.  I did attempt to recannulate the peroneal artery antegrade given a proximal occlusion that was unsuccessful.  Palpable left DP pulse at completion  He is followed by [name] with wound care for his heel wounds.   He is followed by 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  He was last seen by [name] on 08/20/2019 and he had   There are no options for revascularization on the right & [name] recommended BKA on 6/30 but pt was not ready at that time.  He was seen again on 6/30 for agm and pt was still not interested.  [name] arranged follow  up for arterial studies for today and he is here for that visit.    He is accompanied by his care giver.  Pt states he has been doing well.  He has occasional pain in the left foot but this is much better.  He states he does have occasional pain in the right foot as well but tolerable. He states that wound care is coming out 3x/week to check his wounds and dressing changes.  Care giver states that pt is on  chronic Augmentin for his pelvic osteomyelitis.  He was recently put on a cephalosporin and infection markers were up and he contacted [name] who changed this to Doxycycline.   Pt states that he went to turn kind of fast and hit his left 4th toe on his walker.  He and his aide state this is improving.  The pt is on a statin for cholesterol management.    The pt is on an aspirin.    Other AC:  Plavix The pt is not on meds for hypertension.  The pt is have diabetes. Tobacco hx:  never   Past Medical History:  Diagnosis Date  . 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 Diabetes ([place])   . Drug-induced hepatitis 06/05/2018  . Inguinal hernia    06/09/2019: per Dunlap " a long time ago on the right side"  . Left eye pain   . Localized osteoarthrosis of right hip 01/05/2019  . Prostate cancer [facility])     Past Surgical History:  Procedure Laterality Date  . ABDOMINAL AORTOGRAM W/LOWER EXTREMITY Bilateral 08/12/2019   Procedure: ABDOMINAL AORTOGRAM W/LOWER EXTREMITY;  Surgeon: Marty Heck, MD;  Location: [place] [facility];  Service: Cardiovascular;  Laterality: Bilateral;  . ABDOMINAL AORTOGRAM W/LOWER EXTREMITY Left 08/19/2019   Procedure: ABDOMINAL AORTOGRAM W/LOWER EXTREMITY;  Surgeon: Marty Heck, MD;  Location: [place] [facility];  Service: Cardiovascular;  Laterality: Left;  . HERNIA REPAIR    . IR RADIOLOGIST EVAL & MGMT  03/06/2018  . PERIPHERAL VASCULAR BALLOON ANGIOPLASTY Left 08/19/2019   Procedure: PERIPHERAL VASCULAR BALLOON ANGIOPLASTY;  Surgeon: Marty Heck, MD;  Location: [place] [facility];  Service: Cardiovascular;  Laterality: Left;  . PROSTATE SURGERY    . TOTAL HIP ARTHROPLASTY Right 06/12/2019   Procedure: RIGHT TOTAL HIP ARTHROPLASTY DIRECT ANTERIOR;  Surgeon: Marybelle Killings, MD;  Location: [place];  Service: Orthopedics;  Laterality: Right;    Allergies  Allergen Reactions  . Fluconazole Other (See Comments)     Drug-induced hepatitis    Current Outpatient Medications  Medication Sig Dispense Refill  . acetaminophen (TYLENOL) 500 MG tablet Take 1,000 mg by mouth every 6 (six) hours as needed for mild pain or moderate pain.    [name] ALPRAZolam (XANAX) 0.5 MG tablet Take 0.5 mg by mouth 2 (two) times daily.     [name] amitriptyline (ELAVIL) 10 MG tablet Take 20 mg by mouth at bedtime.     [name] amoxicillin-clavulanate (AUGMENTIN) 875-125 MG tablet TAKE 1 TABLET BY MOUTH TWICE DAILY (Dunlap taking differently: Take 1 tablet by mouth 2 (two) times daily. ) 60 tablet 11  . aspirin EC 81 MG tablet Take 81 mg by mouth daily. Swallow whole.    . clopidogrel (PLAVIX) 75 MG tablet Take 1 tablet (75 mg total) by mouth daily. 30 tablet 11  . colchicine 0.6 MG tablet Take 0.6 mg by mouth daily as needed (Gout).     [name] doxycycline (VIBRA-TABS)  100 MG tablet Take 1 tablet (100 mg total) by mouth 2 (two) times daily. 28 tablet 1  . ferrous sulfate 325 (65 FE) MG tablet Take 1 tablet (325 mg total) by mouth daily with breakfast. (Dunlap taking differently: Take 325 mg by mouth daily. )  3  . furosemide (LASIX) 20 MG tablet Take 20 mg by mouth daily.     [name] glimepiride (AMARYL) 4 MG tablet Take 4 mg by mouth daily.     . insulin aspart (NOVOLOG) 100 UNIT/ML injection Inject 0-15 Units into the skin 3 (three) times daily with meals. Sliding scale insulin Less than 70 initiate hypoglycemia protocol 70-120  0 units 120-150 2 unit 151-200 3 units 201-250 3 units 251-300 5 units 301-350 8 units 351-400 11 units  Greater than 400 15 units , call MD (Dunlap taking differently: Inject 0-15 Units into the skin See admin instructions. Sliding scale insulin as needed for high blood sugars Less than 70 initiate hypoglycemia protocol 70-120  0 units 120-150 3 unit 151-200 4 units 201-250 4 units 251-300 6 units 301-350 9 units 351-400 12 units  Greater than 400 16 units , call MD) 10 mL 11  . levothyroxine (SYNTHROID, LEVOTHROID)  100 MCG tablet Take 100 mcg by mouth every morning.     . meloxicam (MOBIC) 7.5 MG tablet Take 7.5 mg by mouth 2 (two) times daily.    [name] nystatin (MYCOSTATIN/NYSTOP) powder Apply topically 4 (four) times daily. (Dunlap taking differently: Apply 1 application topically daily as needed (yeast). ) 60 g 8  . pravastatin (PRAVACHOL) 10 MG tablet Take 10 mg by mouth every evening.     . primidone (MYSOLINE) 50 MG tablet Take 150 mg by mouth at bedtime.     [name] SANTYL ointment Apply 1 application topically 3 (three) times a week.     Willeen Niece 100-33 UNT-MCG/ML SOPN Inject 45 Units as directed every morning.      No current facility-administered medications for this visit.    Family History  Problem Relation Age of Onset  . Pneumonia Father   . Cancer Sister     Social History   Socioeconomic History  . Marital status: Married    Spouse name: Anthony Dunlap  . Number of children: 3  . Years of education: 9th  . Highest education level: Not on file  Occupational History    Employer: RETIRED    Comment: Retired  Tobacco Use  . Smoking status: Never Smoker  . Smokeless tobacco: Never Used  Vaping Use  . Vaping Use: Never used  Substance and Sexual Activity  . Alcohol use: No  . Drug use: No  . Sexual activity: Not on file  Other Topics Concern  . Not on file  Social History Narrative   Dunlap lives at home with his wife. Anthony Dunlap) . Dunlap is retired.   Education 9th grade.   Right handed.   Caffeine None   Social Determinants of Health   Financial Resource Strain:   . Difficulty of Paying Living Expenses:   Food Insecurity:   . Worried About Charity fundraiser in the Last Year:   . Arboriculturist in the Last Year:   Transportation Needs:   . Film/video editor (Medical):   [name] Lack of Transportation (Non-Medical):   Physical Activity:   . Days of Exercise per Week:   . Minutes of Exercise per Session:   Stress:   . Feeling of Stress :   Social  Connections:   .  Frequency of Communication with Friends and Family:   . Frequency of Social Gatherings with Friends and Family:   . Attends Religious Services:   . Active Member of Clubs or Organizations:   . Attends Archivist Meetings:   [name] Marital Status:   Intimate Partner Violence:   . Fear of Current or Ex-Partner:   . Emotionally Abused:   [name] Physically Abused:   . Sexually Abused:      REVIEW OF SYSTEMS:   [X]  denotes positive finding, [ ]  denotes negative finding Cardiac  Comments:  Chest pain or chest pressure:    Shortness of breath upon exertion:    Short of breath when lying flat:    Irregular heart rhythm:        Vascular    Pain in calf, thigh, or hip brought on by ambulation:    Pain in feet at night that wakes you up from your sleep:     Blood clot in your veins:    Leg swelling:         Pulmonary    Oxygen at home:    Productive cough:     Wheezing:         Neurologic    Sudden weakness in arms or legs:     Sudden numbness in arms or legs:     Sudden onset of difficulty speaking or slurred speech:    Temporary loss of vision in one eye:     Problems with dizziness:         Gastrointestinal    Blood in stool:     Vomited blood:         Genitourinary    Burning when urinating:     Blood in urine:        Psychiatric    Major depression:         Hematologic    Bleeding problems:    Problems with blood clotting too easily:        Skin    Rashes or ulcers: x feet      Constitutional    Fever or chills:      PHYSICAL EXAMINATION:  Today's Vitals   09/22/19 1102  BP: (!) 151/90  Pulse: 70  Resp: 20  Temp: (!) 97.4 F (36.3 C)  TempSrc: Temporal  SpO2: 98%  Weight: 174 lb 8 oz (79.2 kg)  Height: 5\' 8"  (1.727 m)   Body mass index is 26.53 kg/m.   General:  WDWN in NAD; vital signs documented above Gait: Not observed HENT: WNL, normocephalic Pulmonary: normal non-labored breathing , without wheezing Cardiac: regular HR, without   Murmur; without carotid bruits Abdomen: soft, NT, no masses Skin: without rashes Vascular Exam/Pulses:  Right Left  Radial 2+ (normal) 2+ (normal)  Ulnar Unable to palpate Unable to palpate  Femoral 2+ (normal) 2+ (normal)  DP Unable to palpate  2+ (normal)  PT Unable to palpate Unable to palpate   Extremities: Right foot   Right foot   Left foot   Left foot    Musculoskeletal: no muscle wasting or atrophy  Neurologic: A&O X 3;  No focal weakness or paresthesias are detected Psychiatric:  The pt has Normal affect.   Non-Invasive Vascular Imaging:   ABI's/TBI's on 09/22/2019: Right:  0.72/0 - Great toe pressure: 103 Left:  1.15/0.96 - Great toe pressure: 160  Arterial duplex on 09/22/2019: +----------+--------+-----+--------+---------+--------+  LEFT   PSV cm/sRatioStenosisWaveform Comments  +----------+--------+-----+--------+---------+--------+  CFA  Distal66          biphasic       +----------+--------+-----+--------+---------+--------+  DFA    75          biphasic       +----------+--------+-----+--------+---------+--------+  SFA Prox 63          biphasic       +----------+--------+-----+--------+---------+--------+  SFA Mid  70          biphasic       +----------+--------+-----+--------+---------+--------+  SFA Distal80          biphasic       +----------+--------+-----+--------+---------+--------+  POP Prox 73          biphasic       +----------+--------+-----+--------+---------+--------+  POP Distal68          biphasic       +----------+--------+-----+--------+---------+--------+  TP Trunk 118          biphasic       +----------+--------+-----+--------+---------+--------+  ATA Prox 169          triphasic      +----------+--------+-----+--------+---------+--------+    ATA Mid  139          triphasic      +----------+--------+-----+--------+---------+--------+  ATA Distal108          triphasic      +----------+--------+-----+--------+---------+--------+  PTA Mid  0                      +----------+--------+-----+--------+---------+--------+  PTA Distal0                      +----------+--------+-----+--------+---------+--------+  PERO Prox 0                      +----------+--------+-----+--------+---------+--------+   Summary:  Left: Widely patent lower extremity arterial system and anterior tibial  artery. No flow observed in the posterior tibial artery or peroneal  artery.   Previous ABI's/TBI's on 08/05/2019: Right:  0.78/0 - Great toe pressure: 0 Left:  0.97/0.54 - Great toe pressure:  85  Previous arterial duplex on 08/05/2019: +----------+--------+-----+--------+----------+---------+  RIGHT   PSV cm/sRatioStenosisWaveform Comments   +----------+--------+-----+--------+----------+---------+  CFA Distal78          triphasic        +----------+--------+-----+--------+----------+---------+  DFA    106          triphasic        +----------+--------+-----+--------+----------+---------+  SFA Prox 100          triphasic        +----------+--------+-----+--------+----------+---------+  SFA Mid  84          triphasic        +----------+--------+-----+--------+----------+---------+  SFA Distal64          triphasic        +----------+--------+-----+--------+----------+---------+  POP Prox 147          triphasic        +----------+--------+-----+--------+----------+---------+  ATA Mid  0       occludedabsent         +----------+--------+-----+--------+----------+---------+  ATA Distal37           monophasic       +----------+--------+-----+--------+----------+---------+  PTA Distal105          biphasic Hyperemic  +----------+--------+-----+--------+----------+---------+   A focal velocity elevation of 0 cm/s was obtained at Mid calf anterior  tibial artery. Findings are characteristic of occluded.      +----------+--------+-----+--------+---------+---------+  LEFT   PSV cm/sRatioStenosisWaveform  Comments   +----------+--------+-----+--------+---------+---------+  CFA Distal83          triphasic       +----------+--------+-----+--------+---------+---------+  DFA    57          triphasic       +----------+--------+-----+--------+---------+---------+  SFA Prox 69          triphasic       +----------+--------+-----+--------+---------+---------+  SFA Mid  76          triphasic       +----------+--------+-----+--------+---------+---------+  SFA Distal81          triphasic       +----------+--------+-----+--------+---------+---------+  POP Prox 124          triphasic       +----------+--------+-----+--------+---------+---------+  POP Distal75          triphasic       +----------+--------+-----+--------+---------+---------+  TP Trunk 81          triphasic       +----------+--------+-----+--------+---------+---------+  ATA Distal100          triphasicHyperemic  +----------+--------+-----+--------+---------+---------+  PTA Distal0       occludedabsent         +----------+--------+-----+--------+---------+---------+   A focal velocity elevation of 0 cm/s was obtained at Distal posterior  tibial. Findings are characteristic of total occlusion.   Summary:  Right: Total occlusion noted in the anterior tibial artery.   Left: Total  occlusion noted in the posterior tibial artery.    ASSESSMENT/PLAN:: 86 y.o. male here for follow up for angioplasty of left ATA on 08/19/2019   -pt has bilateral heel wounds.  Aortogram revealed no revascularization options for the right and [name] offered pt a right BKA.  Pt was not ready to proceed.  I discussed this with pt today and he states the pain he gets in his right foot is tolerable at this time.  His heel wound does not appear infected.  I discussed with pt that if he develops intolerable rest pain or infection in his heel, these would be indications to proceed with amputation.  He and his aide expressed understanding. -on the left foot, pt has a palpable left DP pulse.  He has patency throughout the LLE arterial sx and ATA.   -pt states he would like to wash his feet.  It is okay for pt to use luke warm dial soaks 3x per week prior to dressing changes 3x / week. A letter was given for this.  Discussed with pt that if [name] does not want pt to do this, he can discontinue this.   -pt will f/u in 6 weeks with [name] to evaluate wounds.  They will call sooner should he need [place] before then. -continue statin/asa/plavix   Leontine Locket, [facility] [phone]  Clinic MD:   Early

## 2019-09-25 ENCOUNTER — Emergency Department (HOSPITAL_COMMUNITY): Payer: Medicare Other

## 2019-09-25 ENCOUNTER — Encounter (HOSPITAL_COMMUNITY): Payer: Self-pay | Admitting: Emergency Medicine

## 2019-09-25 ENCOUNTER — Inpatient Hospital Stay (HOSPITAL_COMMUNITY)
Admission: EM | Admit: 2019-09-25 | Discharge: 2019-10-02 | DRG: 240 | Disposition: A | Discharge status: Skilled Nursing Facility | Payer: Medicare Other | Attending: Internal Medicine | Admitting: Internal Medicine

## 2019-09-25 DIAGNOSIS — Z794 Long term (current) use of insulin: Secondary | ICD-10-CM | POA: Diagnosis not present

## 2019-09-25 DIAGNOSIS — Z96641 Presence of right artificial hip joint: Secondary | ICD-10-CM | POA: Diagnosis present

## 2019-09-25 DIAGNOSIS — Z79890 Hormone replacement therapy (postmenopausal): Secondary | ICD-10-CM | POA: Diagnosis not present

## 2019-09-25 DIAGNOSIS — E1152 Type 2 diabetes mellitus with diabetic peripheral angiopathy with gangrene: Secondary | ICD-10-CM | POA: Diagnosis present

## 2019-09-25 DIAGNOSIS — E7800 Pure hypercholesterolemia, unspecified: Secondary | ICD-10-CM | POA: Diagnosis present

## 2019-09-25 DIAGNOSIS — E039 Hypothyroidism, unspecified: Secondary | ICD-10-CM | POA: Diagnosis present

## 2019-09-25 DIAGNOSIS — M86171 Other acute osteomyelitis, right ankle and foot: Secondary | ICD-10-CM | POA: Diagnosis present

## 2019-09-25 DIAGNOSIS — E1169 Type 2 diabetes mellitus with other specified complication: Secondary | ICD-10-CM | POA: Diagnosis present

## 2019-09-25 DIAGNOSIS — Z888 Allergy status to other drugs, medicaments and biological substances status: Secondary | ICD-10-CM

## 2019-09-25 DIAGNOSIS — Z8546 Personal history of malignant neoplasm of prostate: Secondary | ICD-10-CM | POA: Diagnosis not present

## 2019-09-25 DIAGNOSIS — M869 Osteomyelitis, unspecified: Secondary | ICD-10-CM

## 2019-09-25 DIAGNOSIS — Z20822 Contact with and (suspected) exposure to covid-19: Secondary | ICD-10-CM | POA: Diagnosis present

## 2019-09-25 DIAGNOSIS — Z809 Family history of malignant neoplasm, unspecified: Secondary | ICD-10-CM

## 2019-09-25 DIAGNOSIS — I998 Other disorder of circulatory system: Secondary | ICD-10-CM | POA: Diagnosis present

## 2019-09-25 DIAGNOSIS — Z7982 Long term (current) use of aspirin: Secondary | ICD-10-CM

## 2019-09-25 DIAGNOSIS — E119 Type 2 diabetes mellitus without complications: Secondary | ICD-10-CM

## 2019-09-25 DIAGNOSIS — I70244 Atherosclerosis of native arteries of left leg with ulceration of heel and midfoot: Secondary | ICD-10-CM | POA: Diagnosis not present

## 2019-09-25 DIAGNOSIS — I70234 Atherosclerosis of native arteries of right leg with ulceration of heel and midfoot: Secondary | ICD-10-CM | POA: Diagnosis not present

## 2019-09-25 DIAGNOSIS — L97429 Non-pressure chronic ulcer of left heel and midfoot with unspecified severity: Secondary | ICD-10-CM | POA: Diagnosis present

## 2019-09-25 DIAGNOSIS — E1165 Type 2 diabetes mellitus with hyperglycemia: Secondary | ICD-10-CM

## 2019-09-25 DIAGNOSIS — Z7902 Long term (current) use of antithrombotics/antiplatelets: Secondary | ICD-10-CM

## 2019-09-25 DIAGNOSIS — E11621 Type 2 diabetes mellitus with foot ulcer: Secondary | ICD-10-CM | POA: Diagnosis present

## 2019-09-25 DIAGNOSIS — L97419 Non-pressure chronic ulcer of right heel and midfoot with unspecified severity: Secondary | ICD-10-CM | POA: Diagnosis present

## 2019-09-25 DIAGNOSIS — D62 Acute posthemorrhagic anemia: Secondary | ICD-10-CM | POA: Diagnosis not present

## 2019-09-25 DIAGNOSIS — M8668 Other chronic osteomyelitis, other site: Secondary | ICD-10-CM | POA: Diagnosis present

## 2019-09-25 DIAGNOSIS — M86659 Other chronic osteomyelitis, unspecified thigh: Secondary | ICD-10-CM

## 2019-09-25 DIAGNOSIS — I70261 Atherosclerosis of native arteries of extremities with gangrene, right leg: Secondary | ICD-10-CM | POA: Diagnosis present

## 2019-09-25 DIAGNOSIS — Z79899 Other long term (current) drug therapy: Secondary | ICD-10-CM

## 2019-09-25 DIAGNOSIS — D649 Anemia, unspecified: Secondary | ICD-10-CM

## 2019-09-25 DIAGNOSIS — I70229 Atherosclerosis of native arteries of extremities with rest pain, unspecified extremity: Secondary | ICD-10-CM | POA: Diagnosis present

## 2019-09-25 DIAGNOSIS — Z791 Long term (current) use of non-steroidal anti-inflammatories (NSAID): Secondary | ICD-10-CM

## 2019-09-25 LAB — BASIC METABOLIC PANEL
Anion gap: 11 (ref 5–15)
BUN: 30 mg/dL — ABNORMAL HIGH (ref 8–23)
CO2: 23 mmol/L (ref 22–32)
Calcium: 9 mg/dL (ref 8.9–10.3)
Chloride: 102 mmol/L (ref 98–111)
Creatinine, Ser: 1.15 mg/dL (ref 0.61–1.24)
GFR calc Af Amer: >60 mL/min (ref >60)
GFR calc non Af Amer: 58 mL/min — ABNORMAL LOW (ref >60)
Glucose, Bld: 146 mg/dL — ABNORMAL HIGH (ref 70–99)
Potassium: 4.6 mmol/L (ref 3.5–5.1)
Sodium: 136 mmol/L (ref 135–145)

## 2019-09-25 LAB — CBC WITH DIFFERENTIAL/PLATELET
Abs Immature Granulocytes: 0.06 10*3/uL (ref 0.00–0.07)
Basophils Absolute: 0 10*3/uL (ref 0.0–0.1)
Basophils Relative: 0 %
Eosinophils Absolute: 0.1 10*3/uL (ref 0.0–0.5)
Eosinophils Relative: 1 %
HCT: 32.9 % — ABNORMAL LOW (ref 39.0–52.0)
Hemoglobin: 10.1 g/dL — ABNORMAL LOW (ref 13.0–17.0)
Immature Granulocytes: 1 %
Lymphocytes Relative: 11 %
Lymphs Abs: 1.2 10*3/uL (ref 0.7–4.0)
MCH: 26.5 pg (ref 26.0–34.0)
MCHC: 30.7 g/dL (ref 30.0–36.0)
MCV: 86.4 fL (ref 80.0–100.0)
Monocytes Absolute: 0.5 10*3/uL (ref 0.1–1.0)
Monocytes Relative: 5 %
Neutro Abs: 9 10*3/uL — ABNORMAL HIGH (ref 1.7–7.7)
Neutrophils Relative %: 82 %
Platelets: 431 10*3/uL — ABNORMAL HIGH (ref 150–400)
RBC: 3.81 MIL/uL — ABNORMAL LOW (ref 4.22–5.81)
RDW: 13.9 % (ref 11.5–15.5)
WBC: 10.9 10*3/uL — ABNORMAL HIGH (ref 4.0–10.5)
nRBC: 0 % (ref 0.0–0.2)

## 2019-09-25 LAB — GLUCOSE, CAPILLARY: Glucose-Capillary: 113 mg/dL — ABNORMAL HIGH (ref 70–99)

## 2019-09-25 LAB — CREATININE, SERUM
Creatinine, Ser: 1.2 mg/dL (ref 0.61–1.24)
GFR calc Af Amer: >60 mL/min (ref >60)
GFR calc non Af Amer: 55 mL/min — ABNORMAL LOW (ref >60)

## 2019-09-25 LAB — CBG MONITORING, ED: Glucose-Capillary: 122 mg/dL — ABNORMAL HIGH (ref 70–99)

## 2019-09-25 LAB — HEMOGLOBIN A1C
Hgb A1c MFr Bld: 8.1 % — ABNORMAL HIGH (ref 4.8–5.6)
Mean Plasma Glucose: 185.77 mg/dL

## 2019-09-25 LAB — SARS CORONAVIRUS 2 BY RT PCR (HOSPITAL ORDER, PERFORMED IN ~~LOC~~ HOSPITAL LAB): SARS Coronavirus 2: NEGATIVE

## 2019-09-25 MED ORDER — ASPIRIN EC 81 MG PO TBEC
81.0000 mg | DELAYED_RELEASE_TABLET | Freq: Every day | ORAL | Status: DC
  Administered 2019-09-26 – 2019-10-02 (×6): 81 mg via ORAL
  Filled 2019-09-25 (×6): qty 1

## 2019-09-25 MED ORDER — SODIUM CHLORIDE 0.9 % IV BOLUS
500.0000 mL | Freq: Once | INTRAVENOUS | Status: AC
  Administered 2019-09-25: 500 mL via INTRAVENOUS

## 2019-09-25 MED ORDER — GLIMEPIRIDE 4 MG PO TABS
4.0000 mg | ORAL_TABLET | Freq: Every day | ORAL | Status: DC
  Administered 2019-09-26: 4 mg via ORAL
  Filled 2019-09-25: qty 1

## 2019-09-25 MED ORDER — HEPARIN SODIUM (PORCINE) 5000 UNIT/ML IJ SOLN
5000.0000 [IU] | Freq: Three times a day (TID) | INTRAMUSCULAR | Status: DC
  Administered 2019-09-25 – 2019-09-27 (×6): 5000 [IU] via SUBCUTANEOUS
  Filled 2019-09-25 (×6): qty 1

## 2019-09-25 MED ORDER — SODIUM CHLORIDE 0.9 % IV SOLN
2.0000 g | INTRAVENOUS | Status: DC
  Administered 2019-09-26 – 2019-09-27 (×2): 2 g via INTRAVENOUS
  Filled 2019-09-25 (×3): qty 20

## 2019-09-25 MED ORDER — COLCHICINE 0.6 MG PO TABS: 0.6000 mg | ORAL_TABLET | Freq: Every day | ORAL | Status: DC | PRN

## 2019-09-25 MED ORDER — INSULIN ASPART 100 UNIT/ML ~~LOC~~ SOLN
0.0000 [IU] | Freq: Every day | SUBCUTANEOUS | Status: DC
  Administered 2019-09-26: 3 [IU] via SUBCUTANEOUS

## 2019-09-25 MED ORDER — LEVOTHYROXINE SODIUM 100 MCG PO TABS
100.0000 ug | ORAL_TABLET | Freq: Every morning | ORAL | Status: DC
  Administered 2019-09-26 – 2019-10-02 (×7): 100 ug via ORAL
  Filled 2019-09-25 (×7): qty 1

## 2019-09-25 MED ORDER — FUROSEMIDE 20 MG PO TABS
20.0000 mg | ORAL_TABLET | Freq: Every day | ORAL | Status: DC
  Administered 2019-09-26: 20 mg via ORAL
  Filled 2019-09-25: qty 1

## 2019-09-25 MED ORDER — INSULIN ASPART 100 UNIT/ML ~~LOC~~ SOLN
0.0000 [IU] | Freq: Three times a day (TID) | SUBCUTANEOUS | Status: DC
  Administered 2019-09-26: 3 [IU] via SUBCUTANEOUS
  Administered 2019-09-26: 2 [IU] via SUBCUTANEOUS
  Administered 2019-09-27: 3 [IU] via SUBCUTANEOUS
  Administered 2019-09-27: 8 [IU] via SUBCUTANEOUS

## 2019-09-25 MED ORDER — METRONIDAZOLE IN NACL 5-0.79 MG/ML-% IV SOLN
500.0000 mg | Freq: Three times a day (TID) | INTRAVENOUS | Status: DC
  Administered 2019-09-26 – 2019-09-28 (×5): 500 mg via INTRAVENOUS
  Filled 2019-09-25 (×5): qty 100

## 2019-09-25 MED ORDER — AMITRIPTYLINE HCL 10 MG PO TABS
10.0000 mg | ORAL_TABLET | Freq: Every day | ORAL | Status: DC
  Administered 2019-09-25 – 2019-10-01 (×7): 10 mg via ORAL
  Filled 2019-09-25 (×8): qty 1

## 2019-09-25 MED ORDER — VANCOMYCIN HCL IN DEXTROSE 1-5 GM/200ML-% IV SOLN: 1000.0000 mg | INTRAVENOUS | Status: DC

## 2019-09-25 MED ORDER — ALPRAZOLAM 0.5 MG PO TABS
0.5000 mg | ORAL_TABLET | Freq: Two times a day (BID) | ORAL | Status: DC
  Administered 2019-09-25 – 2019-09-26 (×2): 0.5 mg via ORAL
  Filled 2019-09-25 (×2): qty 1

## 2019-09-25 MED ORDER — VANCOMYCIN HCL 2000 MG/400ML IV SOLN: 2000.0000 mg | Freq: Once | INTRAVENOUS | Status: DC

## 2019-09-25 MED ORDER — INSULIN GLARGINE 100 UNIT/ML ~~LOC~~ SOLN
20.0000 [IU] | Freq: Every day | SUBCUTANEOUS | Status: DC
  Administered 2019-09-26 – 2019-09-27 (×2): 20 [IU] via SUBCUTANEOUS
  Filled 2019-09-25 (×5): qty 0.2

## 2019-09-25 MED ORDER — ACETAMINOPHEN 325 MG PO TABS: 650.0000 mg | ORAL_TABLET | Freq: Four times a day (QID) | ORAL | Status: DC | PRN

## 2019-09-25 MED ORDER — SODIUM CHLORIDE 0.9 % IV SOLN
2.0000 g | Freq: Once | INTRAVENOUS | Status: AC
  Administered 2019-09-25: 2 g via INTRAVENOUS
  Filled 2019-09-25: qty 20

## 2019-09-25 MED ORDER — ACETAMINOPHEN 650 MG RE SUPP: 650.0000 mg | Freq: Four times a day (QID) | RECTAL | Status: DC | PRN

## 2019-09-25 MED ORDER — PRAVASTATIN SODIUM 10 MG PO TABS
10.0000 mg | ORAL_TABLET | Freq: Every day | ORAL | Status: DC
  Administered 2019-09-26 – 2019-10-01 (×6): 10 mg via ORAL
  Filled 2019-09-25 (×6): qty 1

## 2019-09-25 MED ORDER — PRIMIDONE 50 MG PO TABS
150.0000 mg | ORAL_TABLET | Freq: Every day | ORAL | Status: DC
  Administered 2019-09-25 – 2019-10-01 (×7): 150 mg via ORAL
  Filled 2019-09-25 (×8): qty 3

## 2019-09-25 MED ORDER — OXYCODONE HCL 5 MG PO TABS: 5.0000 mg | ORAL_TABLET | ORAL | Status: DC | PRN

## 2019-09-25 MED ORDER — CLOPIDOGREL BISULFATE 75 MG PO TABS
75.0000 mg | ORAL_TABLET | Freq: Every day | ORAL | Status: DC
  Administered 2019-09-26 – 2019-09-27 (×2): 75 mg via ORAL
  Filled 2019-09-25 (×2): qty 1

## 2019-09-25 MED ORDER — FERROUS SULFATE 325 (65 FE) MG PO TABS
325.0000 mg | ORAL_TABLET | Freq: Every day | ORAL | Status: DC
  Administered 2019-09-26 – 2019-10-02 (×6): 325 mg via ORAL
  Filled 2019-09-25 (×6): qty 1

## 2019-09-25 NOTE — ED Notes (Signed)
ED TO INPATIENT HANDOFF REPORT  ED Nurse Name and Phone #:  [phone]  S Name/Age/Gender [name] 86 y.o. male Room/Bed: APA19/APA19  Code Status   Code Status: Prior  Home/SNF/Other Home Patient oriented to: self, place, time and situation Is this baseline? Yes   Triage Complete: Triage complete  Chief Complaint Acute osteomyelitis of right foot ([place]) [M86.171]  Triage Note Old wound on right foot.  Patient got up this am to use bathroom and foot started bleeding.      Allergies Allergies  Allergen Reactions  . Fluconazole Other (See Comments)    Drug-induced hepatitis    Level of Care/Admitting Diagnosis ED Disposition    ED Disposition Condition Comment   Admit  Hospital Area: [facility] [[id number]]  Level of Care: Med-Surg [16]  May admit patient to [facility] or [facility] if equivalent level of care is available:: No  Covid Evaluation: Asymptomatic Screening Protocol (No Symptoms)  Diagnosis: Acute osteomyelitis of right foot [facility]) [[id number]]  Admitting Physician: Manfred Shirts  Attending Physician: Waldron Labs, DAWOOD S [[id number]]  Estimated length of stay: 3 - 4 days  Certification:: I certify this patient will need inpatient services for at least 2 midnights       B Medical/Surgery History Past Medical History:  Diagnosis Date  . 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 Diabetes ([place])   . Drug-induced hepatitis 06/05/2018  . Inguinal hernia    06/09/2019: per patient " a long time ago on the right side"  . Left eye pain   . Localized osteoarthrosis of right hip 01/05/2019  . Prostate cancer [facility])    Past Surgical History:  Procedure Laterality Date  . ABDOMINAL AORTOGRAM W/LOWER EXTREMITY Bilateral 08/12/2019   Procedure: ABDOMINAL AORTOGRAM W/LOWER EXTREMITY;  Surgeon: Marty Heck, MD;  Location: [place] [facility];  Service: Cardiovascular;  Laterality: Bilateral;  .  ABDOMINAL AORTOGRAM W/LOWER EXTREMITY Left 08/19/2019   Procedure: ABDOMINAL AORTOGRAM W/LOWER EXTREMITY;  Surgeon: Marty Heck, MD;  Location: [place] [facility];  Service: Cardiovascular;  Laterality: Left;  . HERNIA REPAIR    . IR RADIOLOGIST EVAL & MGMT  03/06/2018  . PERIPHERAL VASCULAR BALLOON ANGIOPLASTY Left 08/19/2019   Procedure: PERIPHERAL VASCULAR BALLOON ANGIOPLASTY;  Surgeon: Marty Heck, MD;  Location: [place] [facility];  Service: Cardiovascular;  Laterality: Left;  . PROSTATE SURGERY    . TOTAL HIP ARTHROPLASTY Right 06/12/2019   Procedure: RIGHT TOTAL HIP ARTHROPLASTY DIRECT ANTERIOR;  Surgeon: Marybelle Killings, MD;  Location: [place];  Service: Orthopedics;  Laterality: Right;     A IV Location/Drains/Wounds Patient Lines/Drains/Airways Status    Active Line/Drains/Airways    Name Placement date Placement time Site Days   Peripheral IV 09/25/19 Left Antecubital 09/25/19  1757  Antecubital  less than 1   Urethral Catheter Latex --  1300  Latex     Incision (Closed) 06/12/19 Hip Right 06/12/19  1056   105          Intake/Output Last 24 hours No intake or output data in the 24 hours ending 09/25/19 1821  Labs/Imaging Results for orders placed or performed during the hospital encounter of 09/25/19 (from the past 48 hour(s))  CBG monitoring, ED     Status: Abnormal   Collection Time: 09/25/19 11:33 AM  Result Value Ref Range   Glucose-Capillary 122 (H) 70 - 99 mg/dL    Comment: Glucose reference range applies only to  samples taken after fasting for at least 8 hours.  CBC with Differential     Status: Abnormal   Collection Time: 09/25/19  3:49 PM  Result Value Ref Range   WBC 10.9 (H) 4.0 - 10.5 K/uL   RBC 3.81 (L) 4.22 - 5.81 MIL/uL   Hemoglobin 10.1 (L) 13.0 - 17.0 g/dL   HCT 32.9 (L) 39 - 52 %   MCV 86.4 80.0 - 100.0 fL   MCH 26.5 26.0 - 34.0 pg   MCHC 30.7 30.0 - 36.0 g/dL   RDW 13.9 11.5 - 15.5 %   Platelets 431 (H) 150 - 400 K/uL   nRBC 0.0  0.0 - 0.2 %   Neutrophils Relative % 82 %   Neutro Abs 9.0 (H) 1.7 - 7.7 K/uL   Lymphocytes Relative 11 %   Lymphs Abs 1.2 0.7 - 4.0 K/uL   Monocytes Relative 5 %   Monocytes Absolute 0.5 0 - 1 K/uL   Eosinophils Relative 1 %   Eosinophils Absolute 0.1 0 - 0 K/uL   Basophils Relative 0 %   Basophils Absolute 0.0 0 - 0 K/uL   Immature Granulocytes 1 %   Abs Immature Granulocytes 0.06 0.00 - 0.07 K/uL    Comment: Performed at [facility], [address] [place], [place] [address]  Basic metabolic panel     Status: Abnormal   Collection Time: 09/25/19  3:49 PM  Result Value Ref Range   Sodium 136 135 - 145 mmol/L   Potassium 4.6 3.5 - 5.1 mmol/L   Chloride 102 98 - 111 mmol/L   CO2 23 22 - 32 mmol/L   Glucose, Bld 146 (H) 70 - 99 mg/dL    Comment: Glucose reference range applies only to samples taken after fasting for at least 8 hours.   BUN 30 (H) 8 - 23 mg/dL   Creatinine, Ser 1.15 0.61 - 1.24 mg/dL   Calcium 9.0 8.9 - 10.3 mg/dL   GFR calc non Af Amer 58 (L) >60 mL/min   GFR calc Af Amer >60 >60 mL/min   Anion gap 11 5 - 15    Comment: Performed at [facility], [address], [place], [place] [address]   MR FOOT RIGHT WO CONTRAST  Result Date: 09/25/2019 CLINICAL DATA:  Nonhealing heel wound.  Abnormal x-ray EXAM: MRI OF THE RIGHT HINDFOOT WITHOUT CONTRAST TECHNIQUE: Multiplanar, multisequence MR imaging of the right hindfoot was performed. No intravenous contrast was administered. COMPARISON:  X-ray 09/25/2019 FINDINGS: Technical note: Motion degraded exam. Bones/Joint/Cartilage There is cortical destruction involving the posterolateral cortex of the posterior calcaneus with extensive bone marrow edema and confluent low T1 signal compatible with acute osteomyelitis (series 7, images 7-10; series 9, images 17-21). Destructive changes involve the lateral most aspect of the Achilles insertion. The remaining visualized bony structures of the hindfoot remain intact. No fracture or  dislocation. No subtalar or tibiotalar joint effusion. Ligaments The anterior and posterior tibiofibular ligaments are intact. The anterior and posterior talofibular ligaments are intact. Intact calcaneofibular ligament. Deltoid ligament and spring ligament complex intact. Muscles and Tendons There is tendinosis with interstitial tearing of the distal Achilles tendon, particularly along its lateral margin at site of osteomyelitis. No complete retracted tear. No retrocalcaneal bursal fluid. The peroneal tendons, posteromedial ankle tendons, and anterior ankle tendons appear intact. No tenosynovitis. Soft tissues Large soft tissue ulceration at the posterolateral aspect of the hindfoot extending to the underlying bone of the posterior calcaneus. No organized fluid collection. There is  surrounding soft tissue edema suggesting cellulitis. IMPRESSION: 1. Large soft tissue ulceration at the posterolateral aspect of the right hindfoot extending to the underlying bone of the posterior calcaneus. Acute osteomyelitis of the posterior calcaneus. 2. Tendinosis with partial-thickness tearing of the distal Achilles tendon, particularly along its lateral margin at site of calcaneal osteomyelitis. No complete retracted tear. Electronically Signed   By: Davina Poke D.O.   On: 09/25/2019 15:44   DG Foot Complete Left  Result Date: 09/25/2019 CLINICAL DATA:  Diabetic heel wound EXAM: LEFT FOOT - COMPLETE 3+ VIEW COMPARISON:  08/05/2019 FINDINGS: No soft tissue defect is seen at the hindfoot. There are bidirectional calcaneal enthesophytes. No bony erosive changes to suggest osteomyelitis. No acute fracture or dislocation. Similar degenerative findings compared to prior. Vascular calcifications. No soft tissue gas. IMPRESSION: No radiographic evidence of osteomyelitis of the left foot. Electronically Signed   By: Davina Poke D.O.   On: 09/25/2019 14:17   DG Foot Complete Right  Result Date: 09/25/2019 CLINICAL DATA:   Bilateral heel wounds.  Diabetes. EXAM: RIGHT FOOT COMPLETE - 3+ VIEW COMPARISON:  08/05/2019. FINDINGS: Soft tissue swelling. No radiopaque foreign body. Dystrophic soft tissue calcifications. Mild erosive changes of the posterior surface and plantar surface of the calcaneus cannot be excluded on today's exam. Osteomyelitis cannot be excluded. MRI can be obtained to further evaluate. Diffuse degenerative change. No evidence of fracture or dislocation. IMPRESSION: Soft tissue swelling. Mild erosive changes of the posterior and plantar surfaces of the calcaneus cannot be excluded on today's exam. Osteomyelitis cannot be excluded. MRI can be obtained to further evaluate. Electronically Signed   By: Marcello Moores  Register   On: 09/25/2019 14:18    Pending Labs Unresulted Labs (From admission, onward) Comment          Start     Ordered   09/25/19 1720  Hemoglobin A1c  Add-on,   AD       Comments: To assess prior glycemic control    09/25/19 1719   09/25/19 1600  SARS Coronavirus 2 by RT PCR (hospital order, performed in [facility] hospital lab) Nasopharyngeal Nasopharyngeal Swab  (Tier 2 (TAT 2 hrs))  Once,   STAT       Question Answer Comment  Is this test for diagnosis or screening Screening   Symptomatic for COVID-19 as defined by CDC No   Hospitalized for COVID-19 No   Admitted to ICU for COVID-19 No   Previously tested for COVID-19 Yes   Resident in a congregate (group) care setting Unknown   Employed in healthcare setting No   Has patient completed COVID vaccination(s) (2 doses of Pfizer/Moderna 1 dose of [employer]) Unknown      09/25/19 1559   Signed and Held  Creatinine, serum  (heparin)  Once,   R       Comments: Baseline for heparin therapy IF NOT ALREADY DRAWN.    Signed and Held   Signed and Held  Basic metabolic panel  Tomorrow morning,   R        Signed and Held   Signed and Held  CBC  Tomorrow morning,   R        Signed and Held          Vitals/Pain Today's Vitals    09/25/19 1034 09/25/19 1037 09/25/19 1759  BP: 102/71  (!) 154/89  Pulse: 75  78  Resp: 18  16  Temp: 97.7 F (36.5 C)  98 F (36.7 C)  TempSrc: Oral  Oral  SpO2: 90%  100%  Weight:  78 kg   Height:  5\' 8"  (1.727 m)   PainSc:  5  0-No pain    Isolation Precautions No active isolations  Medications Medications  cefTRIAXone (ROCEPHIN) 2 g in sodium chloride 0.9 % 100 mL IVPB (2 g Intravenous New Bag/Given (Non-Interop) 09/25/19 1758)  metroNIDAZOLE (FLAGYL) IVPB 500 mg (has no administration in time range)  cefTRIAXone (ROCEPHIN) 2 g in sodium chloride 0.9 % 100 mL IVPB (has no administration in time range)  vancomycin (VANCOREADY) IVPB 2000 mg/400 mL (has no administration in time range)  vancomycin (VANCOCIN) IVPB 1000 mg/200 mL premix (has no administration in time range)  insulin aspart (novoLOG) injection 0-15 Units (has no administration in time range)  insulin aspart (novoLOG) injection 0-5 Units (has no administration in time range)  insulin glargine (LANTUS) injection 20 Units (has no administration in time range)  sodium chloride 0.9 % bolus 500 mL (500 mLs Intravenous New Bag/Given (Non-Interop) 09/25/19 1757)    Mobility walks with device Moderate fall risk   Focused Assessments    R Recommendations: See Admitting Provider Note  Report given to:   Additional Notes:

## 2019-09-25 NOTE — Progress Notes (Signed)
Pharmacy Antibiotic Note  Anthony Dunlap is a 86 y.o. male admitted on 09/25/2019 with diabetic foot ulcer.  Pharmacy has been consulted for Vanco dosing.  Plan: Vancomycin 2000 mg IV x 1 dose. Vancomycin 1000 mg IV every 24 hours. Expected AUC 460. Monitor labs, c/s, and vanco level as indicated.  Height: 5\' 8"  (172.7 cm) Weight: 78 kg (172 lb) IBW/kg (Calculated) : 68.4  Temp (24hrs), Avg:97.7 F (36.5 C), Min:97.7 F (36.5 C), Max:97.7 F (36.5 C)  Recent Labs  Lab 09/25/19 1549  WBC 10.9*  CREATININE 1.15    Estimated Creatinine Clearance: 46.3 mL/min (by C-G formula based on SCr of 1.15 mg/dL).    Allergies  Allergen Reactions  . Fluconazole Other (See Comments)    Drug-induced hepatitis    Antimicrobials this admission: Vanco 8/6 >>  CTX 8/6 >>     Thank you for allowing pharmacy to be a part of this patient's care.  Ramond Craver 09/25/2019 5:10 PM

## 2019-09-25 NOTE — ED Notes (Signed)
Pt to MRI

## 2019-09-25 NOTE — ED Notes (Signed)
Spoke with Phil at [facility] to set up transport at this time.

## 2019-09-25 NOTE — H&P (Addendum)
TRH H&P   Patient Demographics:    Anthony Dunlap, is a 86 y.o. male  MRN: [id number]   DOB - 15-Dec-1933  Admit Date - 09/25/2019  Outpatient Primary MD for the patient is [name], MD  Referring MD/NP/PA: Sammie Bench  Outpatient Specialists: Vascular surgery Dr [name]  Patient coming from: Home  No chief complaint on file.     HPI:    Anthony Dunlap  is a 87 y.o. male, past medical history of diabetes mellitus, prostate cancer, aortic angioplasty, peripheral vascular disease following with [name], history of pelvic/right hip osteomyelitis on chronic antibiotic suppressive therapy for him by [name], Patient presents to ED secondary to bleeding, and nonhealing heel ulcers, patient has bilateral heel ulcers, but report he has been having trouble with the right heel ulcer, which has been bleeding, report he has a chronic wound, due to severe peripheral vascular disease, reports he is having his wound for several month, and had a home health aide that addresses it, reports some bleeding this morning, he denies any injury, any associated pains, no fever, no chills, no nausea or vomiting, no new tingling or numbness, and followed closely with vascular surgery, he had balloon angioplasty of left lower extremity, and there is no revascularization option of the right lower extremity where he was offered BKA, for which by then he did not agree to initially, now in ED he says he will consider it gets indicated by vascular surgery.  - in ED initial x-ray of bilateral feet, showing suspicion of osteomyelitis of right heel, so MRI right foot was obtained which confirmed osteomyelitis, no leukocytosis, no fever, patient was started empirically on Rocephin, Flagyl and vancomycin, [facility] hospital[facility] consulted to admit.   Review of systems:    In addition to the HPI above,  No Fever-chills, No  Headache, No changes with Vision or hearing, No problems swallowing food or Liquids, No Chest pain, Cough or Shortness of Breath, No Abdominal pain, No Nausea or Vommitting, Bowel movements are regular, No Blood in stool or Urine, No dysuria, Patient reports chronic lateral heel wounds, with right heel wound currently bleeding No new joints pains-aches,  No new weakness, tingling, numbness in any extremity, No recent weight gain or loss, No polyuria, polydypsia or polyphagia, No significant Mental Stressors.  A full 10 point Review of Systems was done, except as stated above, all other Review of Systems were negative.   With Past History of the following :    Past Medical History:  Diagnosis Date  . 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 Diabetes ([place])   . Drug-induced hepatitis 06/05/2018  . Inguinal hernia    06/09/2019: per patient " a long time ago on the right side"  . Left eye pain   . Localized osteoarthrosis of right hip 01/05/2019  . Prostate cancer ([place])  Past Surgical History:  Procedure Laterality Date  . ABDOMINAL AORTOGRAM W/LOWER EXTREMITY Bilateral 08/12/2019   Procedure: ABDOMINAL AORTOGRAM W/LOWER EXTREMITY;  Surgeon: Marty Heck, MD;  Location: [facility];  Service: Cardiovascular;  Laterality: Bilateral;  . ABDOMINAL AORTOGRAM W/LOWER EXTREMITY Left 08/19/2019   Procedure: ABDOMINAL AORTOGRAM W/LOWER EXTREMITY;  Surgeon: Marty Heck, MD;  Location: [facility];  Service: Cardiovascular;  Laterality: Left;  . HERNIA REPAIR    . IR RADIOLOGIST EVAL & MGMT  03/06/2018  . PERIPHERAL VASCULAR BALLOON ANGIOPLASTY Left 08/19/2019   Procedure: PERIPHERAL VASCULAR BALLOON ANGIOPLASTY;  Surgeon: Marty Heck, MD;  Location: [facility];  Service: Cardiovascular;  Laterality: Left;  . PROSTATE SURGERY    . TOTAL HIP ARTHROPLASTY Right 06/12/2019   Procedure: RIGHT TOTAL HIP  ARTHROPLASTY DIRECT ANTERIOR;  Surgeon: Marybelle Killings, MD;  Location: [facility];  Service: Orthopedics;  Laterality: Right;      Social History:     Social History   Tobacco Use  . Smoking status: Never Smoker  . Smokeless tobacco: Never Used  Substance Use Topics  . Alcohol use: No       Family History :     Family History  Problem Relation Age of Onset  . Pneumonia Father   . Cancer Sister       Home Medications:   Prior to Admission medications   Medication Sig Start Date End Date Taking? Authorizing Provider  acetaminophen (TYLENOL) 500 MG tablet Take 1,000 mg by mouth every 6 (six) hours as needed for mild pain or moderate pain.    [provider]  ALPRAZolam Duanne Moron) 0.5 MG tablet Take 0.5 mg by mouth 2 (two) times daily.  05/08/19   [provider]  amitriptyline (ELAVIL) 10 MG tablet Take 20 mg by mouth at bedtime.  11/28/18   [provider]  amoxicillin-clavulanate (AUGMENTIN) 875-125 MG tablet TAKE 1 TABLET BY MOUTH TWICE DAILY Patient taking differently: Take 1 tablet by mouth 2 (two) times daily.  06/12/19   Truman Hayward, MD  aspirin EC 81 MG tablet Take 81 mg by mouth daily. Swallow whole.    [provider]  cefdinir (OMNICEF) 300 MG capsule Take 300 mg by mouth 2 (two) times daily. 08/20/19   [provider]  clopidogrel (PLAVIX) 75 MG tablet Take 1 tablet (75 mg total) by mouth daily. 08/19/19 08/18/20  Marty Heck, MD  colchicine 0.6 MG tablet Take 0.6 mg by mouth daily as needed (Gout).  07/27/19   [provider]  doxycycline (VIBRA-TABS) 100 MG tablet Take 1 tablet (100 mg total) by mouth 2 (two) times daily. 08/22/19   Truman Hayward, MD  ferrous sulfate 325 (65 FE) MG tablet Take 1 tablet (325 mg total) by mouth daily with breakfast. Patient taking differently: Take 325 mg by mouth daily.  02/19/18   Oswald Hillock, MD  furosemide (LASIX) 20 MG tablet Take 20 mg by mouth daily.     [provider]  glimepiride (AMARYL) 4 MG tablet Take 4 mg by mouth daily.  04/18/18   [provider]  insulin aspart (NOVOLOG) 100 UNIT/ML injection Inject 0-15 Units into the skin 3 (three) times daily with meals. Sliding scale insulin Less than 70 initiate hypoglycemia protocol 70-120  0 units 120-150 2 unit 151-200 3 units 201-250 3 units 251-300 5 units 301-350 8 units 351-400 11 units  Greater than 400 15 units ,  call MD Patient taking differently: Inject 0-15 Units into the skin See admin instructions. Sliding scale insulin as needed for high blood sugars Less than 70 initiate hypoglycemia protocol 70-120  0 units 120-150 3 unit 151-200 4 units 201-250 4 units 251-300 6 units 301-350 9 units 351-400 12 units  Greater than 400 16 units , call MD 02/18/18   Oswald Hillock, MD  levothyroxine (SYNTHROID, LEVOTHROID) 100 MCG tablet Take 100 mcg by mouth every morning.  04/18/18   [provider]  meloxicam (MOBIC) 7.5 MG tablet Take 7.5 mg by mouth 2 (two) times daily. 08/03/19   [provider]  nystatin (MYCOSTATIN/NYSTOP) powder Apply topically 4 (four) times daily. Patient taking differently: Apply 1 application topically daily as needed (yeast).  03/03/19   Truman Hayward, MD  pravastatin (PRAVACHOL) 10 MG tablet Take 10 mg by mouth every evening.     [provider]  primidone (MYSOLINE) 50 MG tablet Take 150 mg by mouth at bedtime.  02/26/19   [provider]  SANTYL ointment Apply 1 application topically 3 (three) times a week.  07/23/19   [provider]  SOLIQUA 100-33 UNT-MCG/ML SOPN Inject 45 Units as directed every morning.  04/28/18   [provider]     Allergies:     Allergies  Allergen Reactions  . Fluconazole Other (See Comments)    Drug-induced hepatitis     Physical Exam:   Vitals  Blood pressure 102/71, pulse 75, temperature 97.7 F (36.5 C), temperature source Oral, resp. rate 18, height  5\' 8"  (1.727 m), weight 78 kg, SpO2 90 %.         1. General developed male, laying in bed, in no apparent distress  2. Normal affect and insight, Not Suicidal or Homicidal, Awake Alert, Oriented X 3.  3. No F.N deficits, ALL C.Nerves Intact, Strength 5/5 all 4 extremities, Sensation intact all 4 extremities, Plantars down going.  4. Ears and Eyes appear Normal, Conjunctivae clear, PERRLA. Moist Oral Mucosa.  5. Supple Neck, No JVD, No cervical lymphadenopathy appriciated, No Carotid Bruits.  Patient with diminished pulses in the right lower extremity  6. Symmetrical Chest wall movement, Good air movement bilaterally, CTAB.  7. RRR, No Gallops, Rubs or Murmurs, No Parasternal Heave.  8. Positive Bowel Sounds, Abdomen Soft, No tenderness, No organomegaly appriciated,No rebound -guarding or rigidity.  9.   Normal Skin Turgor, patient with bilateral heel ulcers, please see above pictures  10. Good muscle tone,  joints appear normal , no effusions, Normal ROM.  11. No Palpable Lymph Nodes in Neck or Axillae   Data Review:    CBC Recent Labs  Lab 09/25/19 1549  WBC 10.9*  HGB 10.1*  HCT 32.9*  PLT 431*  MCV 86.4  MCH 26.5  MCHC 30.7  RDW 13.9  LYMPHSABS 1.2  MONOABS 0.5  EOSABS 0.1  BASOSABS 0.0   ------------------------------------------------------------------------------------------------------------------  Chemistries  Recent Labs  Lab 09/25/19 1549  NA 136  K 4.6  CL 102  CO2 23  GLUCOSE 146*  BUN 30*  CREATININE 1.15  CALCIUM 9.0   ------------------------------------------------------------------------------------------------------------------ estimated creatinine clearance is 46.3 mL/min (by C-G formula based on SCr of 1.15 mg/dL). ------------------------------------------------------------------------------------------------------------------ No results for input(s): TSH, T4TOTAL, T3FREE, THYROIDAB in the last 72 hours.  Invalid input(s):  FREET3  Coagulation profile No results for input(s): INR, PROTIME in the last 168 hours. ------------------------------------------------------------------------------------------------------------------- No results for input(s): DDIMER in the last 72 hours. -------------------------------------------------------------------------------------------------------------------  Cardiac Enzymes  No results for input(s): CKMB, TROPONINI, MYOGLOBIN in the last 168 hours.  Invalid input(s): CK ------------------------------------------------------------------------------------------------------------------ No results found for: BNP   ---------------------------------------------------------------------------------------------------------------  Urinalysis    Component Value Date/Time   COLORURINE YELLOW 06/12/2019 0822   APPEARANCEUR CLEAR 06/12/2019 0822   LABSPEC 1.008 06/12/2019 0822   PHURINE 7.0 06/12/2019 0822   GLUCOSEU NEGATIVE 06/12/2019 0822   HGBUR NEGATIVE 06/12/2019 0822   BILIRUBINUR NEGATIVE 06/12/2019 0822   KETONESUR NEGATIVE 06/12/2019 0822   PROTEINUR NEGATIVE 06/12/2019 0822   UROBILINOGEN 0.2 09/03/2013 0132   NITRITE POSITIVE (A) 06/12/2019 0822   LEUKOCYTESUR LARGE (A) 06/12/2019 0822    ----------------------------------------------------------------------------------------------------------------   Imaging Results:    MR FOOT RIGHT WO CONTRAST  Result Date: 09/25/2019 CLINICAL DATA:  Nonhealing heel wound.  Abnormal x-ray EXAM: MRI OF THE RIGHT HINDFOOT WITHOUT CONTRAST TECHNIQUE: Multiplanar, multisequence MR imaging of the right hindfoot was performed. No intravenous contrast was administered. COMPARISON:  X-ray 09/25/2019 FINDINGS: Technical note: Motion degraded exam. Bones/Joint/Cartilage There is cortical destruction involving the posterolateral cortex of the posterior calcaneus with extensive bone marrow edema and confluent low T1 signal compatible with  acute osteomyelitis (series 7, images 7-10; series 9, images 17-21). Destructive changes involve the lateral most aspect of the Achilles insertion. The remaining visualized bony structures of the hindfoot remain intact. No fracture or dislocation. No subtalar or tibiotalar joint effusion. Ligaments The anterior and posterior tibiofibular ligaments are intact. The anterior and posterior talofibular ligaments are intact. Intact calcaneofibular ligament. Deltoid ligament and spring ligament complex intact. Muscles and Tendons There is tendinosis with interstitial tearing of the distal Achilles tendon, particularly along its lateral margin at site of osteomyelitis. No complete retracted tear. No retrocalcaneal bursal fluid. The peroneal tendons, posteromedial ankle tendons, and anterior ankle tendons appear intact. No tenosynovitis. Soft tissues Large soft tissue ulceration at the posterolateral aspect of the hindfoot extending to the underlying bone of the posterior calcaneus. No organized fluid collection. There is surrounding soft tissue edema suggesting cellulitis. IMPRESSION: 1. Large soft tissue ulceration at the posterolateral aspect of the right hindfoot extending to the underlying bone of the posterior calcaneus. Acute osteomyelitis of the posterior calcaneus. 2. Tendinosis with partial-thickness tearing of the distal Achilles tendon, particularly along its lateral margin at site of calcaneal osteomyelitis. No complete retracted tear. Electronically Signed   By: Davina Poke D.O.   On: 09/25/2019 15:44   DG Foot Complete Left  Result Date: 09/25/2019 CLINICAL DATA:  Diabetic heel wound EXAM: LEFT FOOT - COMPLETE 3+ VIEW COMPARISON:  08/05/2019 FINDINGS: No soft tissue defect is seen at the hindfoot. There are bidirectional calcaneal enthesophytes. No bony erosive changes to suggest osteomyelitis. No acute fracture or dislocation. Similar degenerative findings compared to prior. Vascular calcifications.  No soft tissue gas. IMPRESSION: No radiographic evidence of osteomyelitis of the left foot. Electronically Signed   By: Davina Poke D.O.   On: 09/25/2019 14:17   DG Foot Complete Right  Result Date: 09/25/2019 CLINICAL DATA:  Bilateral heel wounds.  Diabetes. EXAM: RIGHT FOOT COMPLETE - 3+ VIEW COMPARISON:  08/05/2019. FINDINGS: Soft tissue swelling. No radiopaque foreign body. Dystrophic soft tissue calcifications. Mild erosive changes of the posterior surface and plantar surface of the calcaneus cannot be excluded on today's exam. Osteomyelitis cannot be excluded. MRI can be obtained to further evaluate. Diffuse degenerative change. No evidence of fracture or dislocation. IMPRESSION: Soft tissue swelling. Mild erosive changes of the posterior and plantar surfaces of the calcaneus cannot be excluded on today's  exam. Osteomyelitis cannot be excluded. MRI can be obtained to further evaluate. 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 -For now I will keep on broad-spectrum antibiotic coverage including vancomycin, Rocephin and Flagyl. -Vascular surgery discussed BKA in the past with the patient, he declined then, but report he is can currently considering this option if indicated, so he will be transferred to [facility] for evaluation by vascular surgery.  Peripheral vascular disease -Continue with aspirin, Plavix,  -  he is followed by [name] from vascular surgery, recent left ATA angioplasty and unsuccessful antegrade  recanalization of the peroneal artery. -There is no option for revascularization and the right per vascular surgery note, and they recommended BKA. -We will await further recommendation from vascular surgery.  Diabetes mellitus -Hold oral agents, will resume Lantus at a lower dose (45> 20 units), will add insulin sliding scale, will check A1c.  Left lower extremity heel ulcer -No evidence of osteomyelitis on x-ray. - This that is post balloon angioplasty recently of left lower extremity, will await further vascular surgery recommendation for now.  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 -We will [facility] as an outpatient, he is on suppressive p.o. therapy(medication reconciliation showing he is on Omnicef, Augmentin and doxycycline in the past, patient unsure which antibiotic he is taking currently, but for now we will hold p.o. therapy given he is on IV regimen, his home oral antibiotic regimen should be resumed on discharge)  Chronic normocytic anemia -At baseline, continue with iron supplement  DVT Prophylaxis  Lovenox - SCDs  AM Labs Ordered, also please review Full Orders  Family Communication: Admission, patients condition and plan of care including tests being ordered have been discussed with the patient and wife who indicate understanding and agree with the plan and Code Status.  Code Status Full  Likely DC to  Home vs SNF(will need SNF if he gets BKA)  Condition GUARDED   Consults called: vascular sx dr Trula Slade  Admission status: inpatient  Time spent in minutes : 60 minutes   Phillips Climes M.D on 09/25/2019 at 5:21 PM   [facility] - Office  [phone]

## 2019-09-25 NOTE — ED Provider Notes (Signed)
[facility] Provider Note   CSN: [id number] Arrival date & time: 09/25/19  1008     History No chief complaint on file.   Anthony Dunlap is a 86 y.o. male history of pelvic osteomyelitis, diabetes, prostate cancer, aortic angioplasty.  Patient presents today for bleeding of the right heel he reports he has a chronic wound to the area secondary to his peripheral vascular disease.  He has been caring for the wound for several months and has a home health aide that addresses it.  Bleeding was noticed this morning, no known injury.  He denies any associated pain.  Denies fever/chills, nausea/vomiting, chest pain/shortness of breath, abdominal pain, new numbness, weakness/tingling or any additional concerns.  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 ([place]) 04/21/2018   Diabetes ([place])    Drug-induced hepatitis 06/05/2018   Inguinal hernia    06/09/2019: per patient " a long time ago on the right side"   Left eye pain    Localized osteoarthrosis of right hip 01/05/2019   Prostate cancer [facility])     Patient Active Problem List   Diagnosis Date Noted   Critical lower limb ischemia 08/11/2019   Hx of total hip arthroplasty, right 08/06/2019   Arthritis of right hip 06/12/2019   Pelvic abscess in male [facility]) 09/11/2018   Drug-induced hepatitis 06/05/2018   Calculus of gallbladder without cholecystitis without obstruction    Fungemia/Candida albicans 05/10/2018   UTI (urinary tract infection) 10/62/6948   Complicated UTI (urinary tract infection) 05/08/2018   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 ([place]) 04/21/2018   Abscess of left thigh    Pressure injury of skin 02/10/2018   Enterococcus faecalis infection 01/27/2018   Abscess    Psoas abscess ([place]) 01/23/2018   Sepsis due to Bacteroides species ([place]) 01/22/2018   Sepsis without acute organ dysfunction ([place])     DKA (diabetic ketoacidoses) ([place]) 01/17/2018   Normocytic anemia 01/17/2018   Chronic anemia 01/03/2018   Chronic indwelling Foley catheter 01/03/2018   Elevated alkaline phosphatase level 01/03/2018   Hypothyroidism 01/03/2018   Physical deconditioning 01/03/2018   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   HLD (hyperlipidemia) 08/19/2013   Type 2 diabetes mellitus ([place])    Prostate cancer [facility])     Past Surgical History:  Procedure Laterality Date   ABDOMINAL AORTOGRAM W/LOWER EXTREMITY Bilateral 08/12/2019   Procedure: ABDOMINAL AORTOGRAM W/LOWER EXTREMITY;  Surgeon: Marty Heck, MD;  Location: [place] [facility];  Service: Cardiovascular;  Laterality: Bilateral;   ABDOMINAL AORTOGRAM W/LOWER EXTREMITY Left 08/19/2019   Procedure: ABDOMINAL AORTOGRAM W/LOWER EXTREMITY;  Surgeon: Marty Heck, MD;  Location: [place] [facility];  Service: Cardiovascular;  Laterality: Left;   HERNIA REPAIR     IR RADIOLOGIST EVAL & MGMT  03/06/2018   PERIPHERAL VASCULAR BALLOON ANGIOPLASTY Left 08/19/2019   Procedure: PERIPHERAL VASCULAR BALLOON ANGIOPLASTY;  Surgeon: Marty Heck, MD;  Location: [place] [facility];  Service: Cardiovascular;  Laterality: Left;   PROSTATE SURGERY     TOTAL HIP ARTHROPLASTY Right 06/12/2019   Procedure: RIGHT TOTAL HIP ARTHROPLASTY DIRECT ANTERIOR;  Surgeon: Marybelle Killings, MD;  Location: [place];  Service: Orthopedics;  Laterality: Right;       Family History  Problem Relation Age of Onset   Pneumonia Father    Cancer Sister     Social History   Tobacco Use   Smoking  status: Never Smoker   Smokeless tobacco: Never Used  Vaping Use   Vaping Use: Never used  Substance Use Topics   Alcohol use: No   Drug use: No    Home Medications Prior to Admission medications   Medication Sig Start Date End Date Taking? Authorizing Provider  acetaminophen (TYLENOL) 500 MG tablet Take 1,000 mg by mouth every 6 (six) hours  as needed for mild pain or moderate pain.    [provider]  ALPRAZolam Duanne Moron) 0.5 MG tablet Take 0.5 mg by mouth 2 (two) times daily.  05/08/19   [provider]  amitriptyline (ELAVIL) 10 MG tablet Take 20 mg by mouth at bedtime.  11/28/18   [provider]  amoxicillin-clavulanate (AUGMENTIN) 875-125 MG tablet TAKE 1 TABLET BY MOUTH TWICE DAILY Patient taking differently: Take 1 tablet by mouth 2 (two) times daily.  06/12/19   Truman Hayward, MD  aspirin EC 81 MG tablet Take 81 mg by mouth daily. Swallow whole.    [provider]  cefdinir (OMNICEF) 300 MG capsule Take 300 mg by mouth 2 (two) times daily. 08/20/19   [provider]  clopidogrel (PLAVIX) 75 MG tablet Take 1 tablet (75 mg total) by mouth daily. 08/19/19 08/18/20  Marty Heck, MD  colchicine 0.6 MG tablet Take 0.6 mg by mouth daily as needed (Gout).  07/27/19   [provider]  doxycycline (VIBRA-TABS) 100 MG tablet Take 1 tablet (100 mg total) by mouth 2 (two) times daily. 08/22/19   Truman Hayward, MD  ferrous sulfate 325 (65 FE) MG tablet Take 1 tablet (325 mg total) by mouth daily with breakfast. Patient taking differently: Take 325 mg by mouth daily.  02/19/18   Oswald Hillock, MD  furosemide (LASIX) 20 MG tablet Take 20 mg by mouth daily.     [provider]  glimepiride (AMARYL) 4 MG tablet Take 4 mg by mouth daily.  04/18/18   [provider]  insulin aspart (NOVOLOG) 100 UNIT/ML injection Inject 0-15 Units into the skin 3 (three) times daily with meals. Sliding scale insulin Less than 70 initiate hypoglycemia protocol 70-120  0 units 120-150 2 unit 151-200 3 units 201-250 3 units 251-300 5 units 301-350 8 units 351-400 11 units  Greater than 400 15 units , call MD Patient taking differently: Inject 0-15 Units into the skin See admin instructions. Sliding scale insulin as needed for high blood sugars Less than 70 initiate hypoglycemia  protocol 70-120  0 units 120-150 3 unit 151-200 4 units 201-250 4 units 251-300 6 units 301-350 9 units 351-400 12 units  Greater than 400 16 units , call MD 02/18/18   Oswald Hillock, MD  levothyroxine (SYNTHROID, LEVOTHROID) 100 MCG tablet Take 100 mcg by mouth every morning.  04/18/18   [provider]  meloxicam (MOBIC) 7.5 MG tablet Take 7.5 mg by mouth 2 (two) times daily. 08/03/19   [provider]  nystatin (MYCOSTATIN/NYSTOP) powder Apply topically 4 (four) times daily. Patient taking differently: Apply 1 application topically daily as needed (yeast).  03/03/19   Truman Hayward, MD  pravastatin (PRAVACHOL) 10 MG tablet Take 10 mg by mouth every evening.     [provider]  primidone (MYSOLINE) 50 MG tablet Take 150 mg by mouth at bedtime.  02/26/19   [provider]  SANTYL ointment Apply 1 application topically 3 (three) times a week.  07/23/19   [provider]  Willeen Niece  100-33 UNT-MCG/ML SOPN Inject 45 Units as directed every morning.  04/28/18   [provider]    Allergies    Fluconazole  Review of Systems   Review of Systems Ten systems are reviewed and are negative for acute change except as noted in the HPI  Physical Exam Updated Vital Signs BP 102/71 (BP Location: Left Arm)    Pulse 75    Temp 97.7 F (36.5 C) (Oral)    Resp 18    Ht 5\' 8"  (1.727 m)    Wt 78 kg    SpO2 90%    BMI 26.15 kg/m   Physical Exam Constitutional:      General: He is not in acute distress.    Appearance: Normal appearance. He is well-developed. He is not ill-appearing or diaphoretic.  HENT:     Head: Normocephalic and atraumatic.  Eyes:     General: Vision grossly intact. Gaze aligned appropriately.     Pupils: Pupils are equal, round, and reactive to light.  Neck:     Trachea: Trachea and phonation normal.  Cardiovascular:     Pulses:          Dorsalis pedis pulses are detected w/ Doppler on the right side and detected w/ Doppler  on the left side.       Posterior tibial pulses are detected w/ Doppler on the right side and detected w/ Doppler on the left side.  Pulmonary:     Effort: Pulmonary effort is normal. No respiratory distress.  Abdominal:     General: There is no distension.     Palpations: Abdomen is soft.     Tenderness: There is no abdominal tenderness. There is no guarding or rebound.  Musculoskeletal:        General: Normal range of motion.     Cervical back: Normal range of motion.       Feet:  Feet:     Right foot:     Skin integrity: Ulcer present.     Left foot:     Skin integrity: Ulcer present.     Comments: Right heel large ulcerative wound with area of recent bleeding present, no active bleeding.  No surrounding erythema induration or fluctuance.  Left heel with large healing ulceration, scar tissue present no purulence erythema induration or fluctuance.  See pictures below. Skin:    General: Skin is warm and dry.  Neurological:     Mental Status: He is alert.     GCS: GCS eye subscore is 4. GCS verbal subscore is 5. GCS motor subscore is 6.     Comments: Speech is clear and goal oriented, follows commands Major Cranial nerves without deficit, no facial droop Moves extremities without ataxia, coordination intact  Psychiatric:        Behavior: Behavior normal.         ED Results / Procedures / Treatments   Labs (all labs ordered are listed, but only abnormal results are displayed) Labs Reviewed  CBC WITH DIFFERENTIAL/PLATELET - Abnormal; Notable for the following components:      Result Value   WBC 10.9 (*)    RBC 3.81 (*)    Hemoglobin 10.1 (*)    HCT 32.9 (*)    Platelets 431 (*)    Neutro Abs 9.0 (*)    All other components within normal limits  BASIC METABOLIC PANEL - Abnormal; Notable for the following components:   Glucose, Bld 146 (*)    BUN 30 (*)  GFR calc non Af Amer 58 (*)    All other components within normal limits  CBG MONITORING, ED - Abnormal; Notable  for the following components:   Glucose-Capillary 122 (*)    All other components within normal limits  SARS CORONAVIRUS 2 BY RT PCR (HOSPITAL ORDER, [place] [facility])    EKG None  Radiology MR FOOT RIGHT WO CONTRAST  Result Date: 09/25/2019 CLINICAL DATA:  Nonhealing heel wound.  Abnormal x-ray EXAM: MRI OF THE RIGHT HINDFOOT WITHOUT CONTRAST TECHNIQUE: Multiplanar, multisequence MR imaging of the right hindfoot was performed. No intravenous contrast was administered. COMPARISON:  X-ray 09/25/2019 FINDINGS: Technical note: Motion degraded exam. Bones/Joint/Cartilage There is cortical destruction involving the posterolateral cortex of the posterior calcaneus with extensive bone marrow edema and confluent low T1 signal compatible with acute osteomyelitis (series 7, images 7-10; series 9, images 17-21). Destructive changes involve the lateral most aspect of the Achilles insertion. The remaining visualized bony structures of the hindfoot remain intact. No fracture or dislocation. No subtalar or tibiotalar joint effusion. Ligaments The anterior and posterior tibiofibular ligaments are intact. The anterior and posterior talofibular ligaments are intact. Intact calcaneofibular ligament. Deltoid ligament and spring ligament complex intact. Muscles and Tendons There is tendinosis with interstitial tearing of the distal Achilles tendon, particularly along its lateral margin at site of osteomyelitis. No complete retracted tear. No retrocalcaneal bursal fluid. The peroneal tendons, posteromedial ankle tendons, and anterior ankle tendons appear intact. No tenosynovitis. Soft tissues Large soft tissue ulceration at the posterolateral aspect of the hindfoot extending to the underlying bone of the posterior calcaneus. No organized fluid collection. There is surrounding soft tissue edema suggesting cellulitis. IMPRESSION: 1. Large soft tissue ulceration at the posterolateral aspect of the right  hindfoot extending to the underlying bone of the posterior calcaneus. Acute osteomyelitis of the posterior calcaneus. 2. Tendinosis with partial-thickness tearing of the distal Achilles tendon, particularly along its lateral margin at site of calcaneal osteomyelitis. No complete retracted tear. Electronically Signed   By: Davina Poke D.O.   On: 09/25/2019 15:44   DG Foot Complete Left  Result Date: 09/25/2019 CLINICAL DATA:  Diabetic heel wound EXAM: LEFT FOOT - COMPLETE 3+ VIEW COMPARISON:  08/05/2019 FINDINGS: No soft tissue defect is seen at the hindfoot. There are bidirectional calcaneal enthesophytes. No bony erosive changes to suggest osteomyelitis. No acute fracture or dislocation. Similar degenerative findings compared to prior. Vascular calcifications. No soft tissue gas. IMPRESSION: No radiographic evidence of osteomyelitis of the left foot. Electronically Signed   By: Davina Poke D.O.   On: 09/25/2019 14:17   DG Foot Complete Right  Result Date: 09/25/2019 CLINICAL DATA:  Bilateral heel wounds.  Diabetes. EXAM: RIGHT FOOT COMPLETE - 3+ VIEW COMPARISON:  08/05/2019. FINDINGS: Soft tissue swelling. No radiopaque foreign body. Dystrophic soft tissue calcifications. Mild erosive changes of the posterior surface and plantar surface of the calcaneus cannot be excluded on today's exam. Osteomyelitis cannot be excluded. MRI can be obtained to further evaluate. Diffuse degenerative change. No evidence of fracture or dislocation. IMPRESSION: Soft tissue swelling. Mild erosive changes of the posterior and plantar surfaces of the calcaneus cannot be excluded on today's exam. Osteomyelitis cannot be excluded. MRI can be obtained to further evaluate. Electronically Signed   By: Marcello Moores  Register   On: 09/25/2019 14:18    Procedures Procedures (including critical care time)  Medications Ordered in ED Medications  cefTRIAXone (ROCEPHIN) 2 g in sodium chloride 0.9 % 100 mL IVPB (has no  administration in time range)  sodium chloride 0.9 % bolus 500 mL (has no administration in time range)    ED Course  I have reviewed the triage vital signs and the nursing notes.  Pertinent labs & imaging results that were available during my care of the patient were reviewed by me and considered in my medical decision making (see chart for details).    MDM Rules/Calculators/A&P                          Additional history obtained from: 1. Nursing notes from this visit. 2. Reviewed EMR.  Outpatient vascular surgery visit from 09/22/2019.  Pictures reviewed from that visit it appears the yellow/white scar tissue on the right heel pulled off possibly causing the bleeding prior to arrival.  It appears patient has been on Augmentin for chronic osteomyelitis of the hip.  Possibly now on doxycycline instead?  Patient is currently taking statin/aspirin/Plavix.  It appears vascular surgery had discussed BKA with patient in the past but that time patient was hesitant. ------------------------------------------- 86 year old male history as detailed above presented today for concern of bleeding from the right heel.  Chronic wounds of both heels as pictured above.  It appears that scar tissue may have peeled off the right heel leading to the bleeding.  He denies any pain or infectious type symptoms.  Dopplerable pulses in both feet.  Will obtain x-rays to evaluate if this bleeding today may be due to development of osteomyelitis.  Discussed with Dr. Laverta [place] who agrees with plan. - DG Right Foot:  IMPRESSION:  Soft tissue swelling. Mild erosive changes of the posterior and  plantar surfaces of the calcaneus cannot be excluded on today's  exam. Osteomyelitis cannot be excluded. MRI can be obtained to  further evaluate.   DG Left Foot:  IMPRESSION:  No radiographic evidence of osteomyelitis of the left foot.  - MRI Right Foot:  IMPRESSION:  1. Large soft tissue ulceration at the posterolateral aspect of  the  right hindfoot extending to the underlying bone of the posterior  calcaneus. Acute osteomyelitis of the posterior calcaneus.  2. Tendinosis with partial-thickness tearing of the distal Achilles  tendon, particularly along its lateral margin at site of calcaneal  osteomyelitis. No complete retracted tear.   CBC shows mild leukocytosis of 10.9, hemoglobin of 10.1 appears baseline. BMP shows no emergent electrolyte derangement, AKI or gap. CBG 122, patient does not appear to be in DKA.  Patient started on ceftriaxone and vancomycin for osteomyelitis of the right heel, will consult hospitalist for admission.  Screening Covid test ordered.  Discussed case with Dr. Rogene Houston, attending at shift change who agrees with plan. - 4:55 PM: Discussed case with Dr. Waldron Labs, patient has been accepted to hospitalist service.  Shared decision making made with patient and family member at bedside they are agreeable for admission and possible transfer to [facility].   Note: Portions of this report may have been transcribed using voice recognition software. Every effort was made to ensure accuracy; however, inadvertent computerized transcription errors may still be present. Final Clinical Impression(s) / ED Diagnoses Final diagnoses:  Osteomyelitis of right foot, unspecified type [facility])    Rx / DC Orders ED Discharge Orders    None       Gari Crown 09/25/19 1656    Margette Fast, MD 09/26/19 [phone]

## 2019-09-25 NOTE — ED Notes (Signed)
Report given to RN 5N, [facility]

## 2019-09-25 NOTE — ED Triage Notes (Signed)
Old wound on right foot.  Patient got up this am to use bathroom and foot started bleeding.

## 2019-09-26 ENCOUNTER — Other Ambulatory Visit: Payer: Self-pay

## 2019-09-26 DIAGNOSIS — M869 Osteomyelitis, unspecified: Secondary | ICD-10-CM

## 2019-09-26 DIAGNOSIS — I70234 Atherosclerosis of native arteries of right leg with ulceration of heel and midfoot: Secondary | ICD-10-CM

## 2019-09-26 DIAGNOSIS — I70244 Atherosclerosis of native arteries of left leg with ulceration of heel and midfoot: Secondary | ICD-10-CM

## 2019-09-26 LAB — GLUCOSE, CAPILLARY
Glucose-Capillary: 57 mg/dL — ABNORMAL LOW (ref 70–99)
Glucose-Capillary: 182 mg/dL — ABNORMAL HIGH (ref 70–99)
Glucose-Capillary: 146 mg/dL — ABNORMAL HIGH (ref 70–99)

## 2019-09-26 LAB — BASIC METABOLIC PANEL
Anion gap: 13 (ref 5–15)
BUN: 28 mg/dL — ABNORMAL HIGH (ref 8–23)
CO2: 22 mmol/L (ref 22–32)
Calcium: 8.7 mg/dL — ABNORMAL LOW (ref 8.9–10.3)
Chloride: 103 mmol/L (ref 98–111)
Creatinine, Ser: 1.18 mg/dL (ref 0.61–1.24)
GFR calc Af Amer: >60 mL/min (ref >60)
GFR calc non Af Amer: 56 mL/min — ABNORMAL LOW (ref >60)
Glucose, Bld: 122 mg/dL — ABNORMAL HIGH (ref 70–99)
Potassium: 3.8 mmol/L (ref 3.5–5.1)
Sodium: 138 mmol/L (ref 135–145)

## 2019-09-26 LAB — CBC
HCT: 28.4 % — ABNORMAL LOW (ref 39.0–52.0)
Hemoglobin: 8.9 g/dL — ABNORMAL LOW (ref 13.0–17.0)
MCH: 26.3 pg (ref 26.0–34.0)
MCHC: 31.3 g/dL (ref 30.0–36.0)
MCV: 83.8 fL (ref 80.0–100.0)
Platelets: 357 10*3/uL (ref 150–400)
RBC: 3.39 MIL/uL — ABNORMAL LOW (ref 4.22–5.81)
RDW: 13.7 % (ref 11.5–15.5)
WBC: 8.7 10*3/uL (ref 4.0–10.5)
nRBC: 0 % (ref 0.0–0.2)

## 2019-09-26 LAB — SURGICAL PCR SCREEN
MRSA, PCR: NEGATIVE
Staphylococcus aureus: NEGATIVE

## 2019-09-26 MED ORDER — JUVEN PO PACK
1.0000 | PACK | Freq: Two times a day (BID) | ORAL | Status: DC
  Administered 2019-09-26 – 2019-10-02 (×11): 1 via ORAL
  Filled 2019-09-26 (×11): qty 1

## 2019-09-26 MED ORDER — CHLORHEXIDINE GLUCONATE CLOTH 2 % EX PADS
6.0000 | MEDICATED_PAD | Freq: Every day | CUTANEOUS | Status: DC
  Administered 2019-09-27: 6 via TOPICAL

## 2019-09-26 MED ORDER — ADULT MULTIVITAMIN W/MINERALS CH
1.0000 | ORAL_TABLET | Freq: Every day | ORAL | Status: DC
  Administered 2019-09-26 – 2019-10-02 (×6): 1 via ORAL
  Filled 2019-09-26 (×6): qty 1

## 2019-09-26 MED ORDER — PROSOURCE PLUS PO LIQD
30.0000 mL | Freq: Two times a day (BID) | ORAL | Status: DC
  Administered 2019-09-26 – 2019-10-01 (×8): 30 mL via ORAL
  Filled 2019-09-26 (×10): qty 30

## 2019-09-26 MED ORDER — VANCOMYCIN HCL IN DEXTROSE 1-5 GM/200ML-% IV SOLN
1000.0000 mg | INTRAVENOUS | Status: DC
  Administered 2019-09-27 – 2019-09-29 (×2): 1000 mg via INTRAVENOUS
  Filled 2019-09-26 (×3): qty 200

## 2019-09-26 MED ORDER — ALPRAZOLAM 0.5 MG PO TABS
0.5000 mg | ORAL_TABLET | Freq: Two times a day (BID) | ORAL | Status: DC | PRN
  Administered 2019-09-28: 0.5 mg via ORAL
  Filled 2019-09-26: qty 1

## 2019-09-26 MED ORDER — VANCOMYCIN HCL 2000 MG/400ML IV SOLN
2000.0000 mg | Freq: Once | INTRAVENOUS | Status: AC
  Administered 2019-09-26: 2000 mg via INTRAVENOUS
  Filled 2019-09-26: qty 400

## 2019-09-26 NOTE — Progress Notes (Signed)
PROGRESS NOTE    Anthony Dunlap  ION:[id number] DOB: [birth year] DOA: 09/25/2019 PCP: [name], MD  Brief Narrative: Anthony Dunlap  is a 86 y.o. male, past medical history of diabetes mellitus, prostate cancer, aortic angioplasty, peripheral vascular disease following with [name], history of pelvic/right hip osteomyelitis on chronic antibiotic suppressive therapy for him by [name], Patient presented to ED secondary to bleeding from nonhealing heel ulcers which are chronic due to severe peripheral vascular disease, followed by vascular surgery previously underwent angiogram and did not have revascularization options on the right, BKA was recommended at the time but patient does not ready to proceed. - in ED initial x-ray of bilateral feet, concerning for osteomyelitis of right heel, so MRI right foot was obtained which confirmed osteomyelitis, no leukocytosis, no fever, patient was started empirically on Rocephin, Flagyl and vancomycin -VVS consulted, now BKA being planned   Assessment & Plan:   Acute osteomyelitis of right foot Severe peripheral vascular disease -MRI confirms osteomyelitis of the calcaneus - no revascularization options on the right based on prior angiogram, underwent recent left anterior tibial artery angioplasty and unsuccessful antegrade recanalization of peroneal artery -Vascular surgery consulted and following -Plan for right BKA -Currently on aspirin and Plavix  Diabetes mellitus -Oral hypoglycemics on hold  -Lantus resumed at a lower dose, continue sliding scale insulin -Hemoglobin A1c is 8.1  Left heel ulcer -No evidence of osteomyelitis of left foot on x-ray -Status post balloon angioplasty recently of left lower extremity, follow-up with vascular surgery  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 -Followed by infectious disease [name] as outpatient on suppressive chronic antibiotic therapy including Omnicef,  Augmentin and doxycycline in the past -Resume home antibiotic regimen at discharge  Chronic normocytic anemia -Hemoglobin slightly lower, monitor  DVT prophylaxis: Heparin subcutaneous Code Status: Full code Family Communication: No family at bedside Disposition Plan:  Status is: Inpatient  Remains inpatient appropriate because:Inpatient level of care appropriate due to severity of illness   Dispo: The patient is from: Home              Anticipated d/c is to: To be determined              Anticipated d/c date is: 3 days              Patient currently is not medically stable to d/c.   Consultants:  Vascular surgery  Procedures:   Subjective: -Mild discomfort in his right heel, no other symptoms or complaints  Objective: Vitals:   09/25/19 1929 09/26/19 0349 09/26/19 0808 09/26/19 1345  BP: (!) 176/110 (!) 145/82 116/83 128/81  Pulse: (!) 101 96 (!) 107 93  Resp: 18 17 16 18   Temp: (!) 97.3 F (36.3 C) (!) 97.5 F (36.4 C) 99.1 F (37.3 C) 97.8 F (36.6 C)  TempSrc: Oral Oral Oral Oral  SpO2: 100% 100% 100% 100%  Weight:      Height:        Intake/Output Summary (Last 24 hours) at 09/26/2019 1419 Last data filed at 09/26/2019 1346 Gross per 24 hour  Intake 340 ml  Output 2125 ml  Net -1785 ml   Filed Weights   09/25/19 1037  Weight: 78 kg    Examination:  General exam: Chronically ill elderly male sitting up in bed, AAOx3, no distress CVS: S1-S2, regular rate rhythm Lungs: Clear bilaterally Abdomen: Soft, obese, mildly distended, bowel sounds present, nontender Extremities: No edema, deep bilateral heel ulcers with dressing  Skin : Extensive deep heel ulcers with dressing on the R foot Psychiatry:  Mood & affect appropriate.   Data Reviewed:   CBC: Recent Labs  Lab 09/25/19 1549 09/26/19 0118  WBC 10.9* 8.7  NEUTROABS 9.0*  --   HGB 10.1* 8.9*  HCT 32.9* 28.4*  MCV 86.4 83.8  PLT 431* 536   Basic Metabolic Panel: Recent Labs  Lab  09/25/19 1549 09/25/19 1957 09/26/19 0118  NA 136  --  138  K 4.6  --  3.8  CL 102  --  103  CO2 23  --  22  GLUCOSE 146*  --  122*  BUN 30*  --  28*  CREATININE 1.15 1.20 1.18  CALCIUM 9.0  --  8.7*   GFR: Estimated Creatinine Clearance: 45.1 mL/min (by C-G formula based on SCr of 1.18 mg/dL). Liver Function Tests: No results for input(s): AST, ALT, ALKPHOS, BILITOT, PROT, ALBUMIN in the last 168 hours. No results for input(s): LIPASE, AMYLASE in the last 168 hours. No results for input(s): AMMONIA in the last 168 hours. Coagulation Profile: No results for input(s): INR, PROTIME in the last 168 hours. Cardiac Enzymes: No results for input(s): CKTOTAL, CKMB, CKMBINDEX, TROPONINI in the last 168 hours. BNP (last 3 results) No results for input(s): PROBNP in the last 8760 hours. HbA1C: Recent Labs    09/25/19 1554  HGBA1C 8.1*   CBG: Recent Labs  Lab 09/25/19 1133 09/25/19 1934 09/26/19 0628 09/26/19 1141  GLUCAP 122* 113* 57* 182*   Lipid Profile: No results for input(s): CHOL, HDL, LDLCALC, TRIG, CHOLHDL, LDLDIRECT in the last 72 hours. Thyroid Function Tests: No results for input(s): TSH, T4TOTAL, FREET4, T3FREE, THYROIDAB in the last 72 hours. Anemia Panel: No results for input(s): VITAMINB12, FOLATE, FERRITIN, TIBC, IRON, RETICCTPCT in the last 72 hours. Urine analysis:    Component Value Date/Time   COLORURINE YELLOW 06/12/2019 0822   APPEARANCEUR CLEAR 06/12/2019 0822   LABSPEC 1.008 06/12/2019 0822   PHURINE 7.0 06/12/2019 0822   GLUCOSEU NEGATIVE 06/12/2019 0822   HGBUR NEGATIVE 06/12/2019 0822   BILIRUBINUR NEGATIVE 06/12/2019 0822   KETONESUR NEGATIVE 06/12/2019 0822   PROTEINUR NEGATIVE 06/12/2019 0822   UROBILINOGEN 0.2 09/03/2013 0132   NITRITE POSITIVE (A) 06/12/2019 0822   LEUKOCYTESUR LARGE (A) 06/12/2019 0822   Sepsis Labs: @LABRCNTIP (procalcitonin:4,lacticidven:4)  ) Recent Results (from the past 240 hour(s))  SARS Coronavirus 2 by  RT PCR (hospital order, performed in [facility] hospital lab) Nasopharyngeal Nasopharyngeal Swab     Status: None   Collection Time: 09/25/19  6:20 PM   Specimen: Nasopharyngeal Swab  Result Value Ref Range Status   SARS Coronavirus 2 NEGATIVE NEGATIVE Final    Comment: (NOTE) SARS-CoV-2 target nucleic acids are NOT DETECTED.  The SARS-CoV-2 RNA is generally detectable in upper and lower respiratory specimens during the acute phase of infection. The lowest concentration of SARS-CoV-2 viral copies this assay can detect is 250 copies / mL. A negative result does not preclude SARS-CoV-2 infection and should not be used as the sole basis for treatment or other patient management decisions.  A negative result may occur with improper specimen collection / handling, submission of specimen other than nasopharyngeal swab, presence of viral mutation(s) within the areas targeted by this assay, and inadequate number of viral copies (<250 copies / mL). A negative result must be combined with clinical observations, patient history, and epidemiological information.  Fact Sheet for Patients:   StrictlyIdeas.no  Fact Sheet for Healthcare Providers: BankingDealers.co.za  This test is not yet approved or  cleared by the [place] and has been authorized for detection and/or diagnosis of SARS-CoV-2 by FDA under an Emergency Use Authorization (EUA).  This EUA will remain in effect (meaning this test can be used) for the duration of the COVID-19 declaration under Section 564(b)(1) of the Act, 21 U.S.C. section 360bbb-3(b)(1), unless the authorization is terminated or revoked sooner.  Performed at [facility], [address] [place], [place] [address]   Surgical pcr screen     Status: None   Collection Time: 09/26/19  4:22 AM   Specimen: Nasal Mucosa; Nasal Swab  Result Value Ref Range Status   MRSA, PCR NEGATIVE NEGATIVE Final    Staphylococcus aureus NEGATIVE NEGATIVE Final    Comment: (NOTE) The Xpert SA Assay (FDA approved for NASAL specimens in patients 66 years of age and older), is one component of a comprehensive surveillance program. It is not intended to diagnose infection nor to guide or monitor treatment. Performed at [facility], [place] [address], [place], [place] [address]          Radiology Studies: MR FOOT RIGHT WO CONTRAST  Result Date: 09/25/2019 CLINICAL DATA:  Nonhealing heel wound.  Abnormal x-ray EXAM: MRI OF THE RIGHT HINDFOOT WITHOUT CONTRAST TECHNIQUE: Multiplanar, multisequence MR imaging of the right hindfoot was performed. No intravenous contrast was administered. COMPARISON:  X-ray 09/25/2019 FINDINGS: Technical note: Motion degraded exam. Bones/Joint/Cartilage There is cortical destruction involving the posterolateral cortex of the posterior calcaneus with extensive bone marrow edema and confluent low T1 signal compatible with acute osteomyelitis (series 7, images 7-10; series 9, images 17-21). Destructive changes involve the lateral most aspect of the Achilles insertion. The remaining visualized bony structures of the hindfoot remain intact. No fracture or dislocation. No subtalar or tibiotalar joint effusion. Ligaments The anterior and posterior tibiofibular ligaments are intact. The anterior and posterior talofibular ligaments are intact. Intact calcaneofibular ligament. Deltoid ligament and spring ligament complex intact. Muscles and Tendons There is tendinosis with interstitial tearing of the distal Achilles tendon, particularly along its lateral margin at site of osteomyelitis. No complete retracted tear. No retrocalcaneal bursal fluid. The peroneal tendons, posteromedial ankle tendons, and anterior ankle tendons appear intact. No tenosynovitis. Soft tissues Large soft tissue ulceration at the posterolateral aspect of the hindfoot extending to the underlying bone of the posterior  calcaneus. No organized fluid collection. There is surrounding soft tissue edema suggesting cellulitis. IMPRESSION: 1. Large soft tissue ulceration at the posterolateral aspect of the right hindfoot extending to the underlying bone of the posterior calcaneus. Acute osteomyelitis of the posterior calcaneus. 2. Tendinosis with partial-thickness tearing of the distal Achilles tendon, particularly along its lateral margin at site of calcaneal osteomyelitis. No complete retracted tear. Electronically Signed   By: Davina Poke D.O.   On: 09/25/2019 15:44   DG Foot Complete Left  Result Date: 09/25/2019 CLINICAL DATA:  Diabetic heel wound EXAM: LEFT FOOT - COMPLETE 3+ VIEW COMPARISON:  08/05/2019 FINDINGS: No soft tissue defect is seen at the hindfoot. There are bidirectional calcaneal enthesophytes. No bony erosive changes to suggest osteomyelitis. No acute fracture or dislocation. Similar degenerative findings compared to prior. Vascular calcifications. No soft tissue gas. IMPRESSION: No radiographic evidence of osteomyelitis of the left foot. Electronically Signed   By: Davina Poke D.O.   On: 09/25/2019 14:17   DG Foot Complete Right  Result Date: 09/25/2019 CLINICAL DATA:  Bilateral heel wounds.  Diabetes. EXAM: RIGHT FOOT COMPLETE -  3+ VIEW COMPARISON:  08/05/2019. FINDINGS: Soft tissue swelling. No radiopaque foreign body. Dystrophic soft tissue calcifications. Mild erosive changes of the posterior surface and plantar surface of the calcaneus cannot be excluded on today's exam. Osteomyelitis cannot be excluded. MRI can be obtained to further evaluate. Diffuse degenerative change. No evidence of fracture or dislocation. IMPRESSION: Soft tissue swelling. Mild erosive changes of the posterior and plantar surfaces of the calcaneus cannot be excluded on today's exam. Osteomyelitis cannot be excluded. MRI can be obtained to further evaluate. Electronically Signed   By: [place]   On: 09/25/2019 14:18         Scheduled Meds: . (feeding supplement) PROSource Plus  30 mL Oral BID WC  . ALPRAZolam  0.5 mg Oral BID  . amitriptyline  10 mg Oral QHS  . aspirin EC  81 mg Oral Daily  . clopidogrel  75 mg Oral Daily  . ferrous sulfate  325 mg Oral Q breakfast  . furosemide  20 mg Oral Daily  . glimepiride  4 mg Oral Q breakfast  . heparin  5,000 Units Subcutaneous Q8H  . insulin aspart  0-15 Units Subcutaneous TID WC  . insulin aspart  0-5 Units Subcutaneous QHS  . insulin glargine  20 Units Subcutaneous Daily  . levothyroxine  100 mcg Oral q morning - 10a  . multivitamin with minerals  1 tablet Oral Daily  . nutrition supplement (JUVEN)  1 packet Oral BID BM  . pravastatin  10 mg Oral q1800  . primidone  150 mg Oral QHS   Continuous Infusions: . cefTRIAXone (ROCEPHIN)  IV    . metronidazole    . [START ON 09/27/2019] vancomycin       LOS: 1 day    Time spent: 90min  Domenic Polite, MD [facility]  09/26/2019, 2:19 PM

## 2019-09-26 NOTE — Progress Notes (Signed)
Inpatient Diabetes Program Recommendations  AACE/ADA: New Consensus Statement on Inpatient Glycemic Control   Target Ranges:  Prepandial:   less than 140 mg/dL      Peak postprandial:   less than 180 mg/dL (1-2 hours)      Critically ill patients:  140 - 180 mg/dL  Results for Anthony Dunlap, Anthony Dunlap (MRN [id number]) as of 09/26/2019 12:10  Ref. Range 09/25/2019 11:33 09/25/2019 19:34 09/26/2019 06:28 09/26/2019 11:41  Glucose-Capillary Latest Ref Range: 70 - 99 mg/dL 122 (H) 113 (H) 57 (L) 182 (H)    Review of Glycemic Control  Diabetes history: DM2 Outpatient Diabetes medications: Soliqua 40 units QAM, Novolog 0-15 units TID with meals, Amaryl 4 mg QAM Current orders for Inpatient glycemic control: Lantus 20 units daily, Novolog 0-15 units TID with meals, Novolog 0-5 units QHS, Amaryl 4 mg QAM   Inpatient Diabetes Program Recommendations:    Oral DM medications: Please discontinue Amaryl while inpatient.  Thanks, [name], RN, MSN, CDE Diabetes Coordinator Inpatient Diabetes Program [phone] (Team Pager from 8am to 5pm)

## 2019-09-26 NOTE — Evaluation (Signed)
Occupational Therapy Evaluation Patient Details Name: Anthony Dunlap MRN: [id number] DOB: 12/26/1933 Today's Date: 09/26/2019    History of Present Illness Pt is a 86 yr old male who presented due to bleeding and nonhealing heel ulcers. PMH but not limited to  pelvic/right hip osteomyelitis, prostate ca, hernia, R total hip arthroplasty    Clinical Impression   Pt admitted with nonhealing ulcers. Pt currently with functional limitations due to the deficits listed below (see OT Problem List). Pt was seen with Physical Therapist as due to impulsive behaviors. Pt noted to have decrease in coordination in FM and sensation in L hand LLE. Pt at this time required min guard with HOB elevated and bed rails for supine to sitting and cues for safety. Pt with elevated surface sit to stand with min guard to min assist and max cues for safety. Pt requires mod to min assist for LE ADLS due to safety at this time. Pt reports they have aide that comes in to assist them 3 x a week and was completion of therapy prior to this Ed visit. Pt will benefit from skilled OT to increase their safety and independence with ADL and functional mobility for ADL to facilitate discharge to venue listed below.       Follow Up Recommendations  SNF;Supervision/Assistance - 24 hour    Equipment Recommendations       Recommendations for Other Services       Precautions / Restrictions Precautions Precautions: Fall Precaution Comments: pt was very impulsive Restrictions Weight Bearing Restrictions: No      Mobility Bed Mobility Overal bed mobility: Needs Assistance Bed Mobility: Supine to Sit     Supine to sit: Min guard;HOB elevated     General bed mobility comments: pt very impulsive and cues on hand placement  Transfers Overall transfer level: Needs assistance Equipment used: Rolling walker (2 wheeled) Transfers: Sit to/from Stand Sit to Stand: +2 physical assistance;+2 safety/equipment;From elevated  surface;Min assist         General transfer comment: pt attmepting to stand without cueing     Balance Overall balance assessment: Mild deficits observed, not formally tested                                         ADL either performed or assessed with clinical judgement   ADL Overall ADL's : Needs assistance/impaired Eating/Feeding: Independent;Sitting   Grooming: Wash/dry hands;Wash/dry face;Sitting;Cueing for safety;Cueing for sequencing;Set up   Upper Body Bathing: Minimal assistance;Cueing for safety;Cueing for sequencing;Sitting;Standing   Lower Body Bathing: Moderate assistance;Cueing for safety;Cueing for sequencing;Sit to/from stand   Upper Body Dressing : Min guard;Sitting;Cueing for safety;Cueing for sequencing   Lower Body Dressing: Moderate assistance;Cueing for safety;Cueing for sequencing;Sit to/from stand   Toilet Transfer: Moderate assistance;Cueing for safety;Cueing for sequencing;Ambulation;RW   Toileting- Clothing Manipulation and Hygiene: Minimal assistance;Cueing for safety;Cueing for sequencing;Sit to/from stand   Tub/ Banker: Moderate assistance;Rolling walker   Functional mobility during ADLs: Moderate assistance;+2 for safety/equipment;Rolling walker       Vision Baseline Vision/History: No visual deficits Patient Visual Report: No change from baseline Vision Assessment?: No apparent visual deficits     Perception Perception Perception Tested?: No   Praxis Praxis Praxis tested?: Not tested    Pertinent Vitals/Pain Pain Assessment: Faces Faces Pain Scale: Hurts a little bit Pain Descriptors / Indicators: Aching;Grimacing Pain Intervention(s): Limited activity within patient's tolerance;Monitored during  session;Repositioned     Hand Dominance Right   Extremity/Trunk Assessment Upper Extremity Assessment Upper Extremity Assessment: Generalized weakness   Lower Extremity Assessment Lower Extremity  Assessment: Defer to PT evaluation   Cervical / Trunk Assessment Cervical / Trunk Assessment: Kyphotic   Communication Communication Communication: No difficulties   Cognition Arousal/Alertness: Awake/alert Behavior During Therapy: Impulsive Overall Cognitive Status: Difficult to assess                                 General Comments: pt cues for saftey throughout session   General Comments       Exercises     Shoulder Instructions      Home Living Family/patient expects to be discharged to:: Private residence Living Arrangements: Spouse/significant other;Other relatives Available Help at Discharge: Family;Available PRN/intermittently Type of Home: House Home Access: Ramped entrance     Home Layout: One level     Bathroom Shower/Tub: Occupational psychologist: Standard Bathroom Accessibility: Yes How Accessible: Accessible via walker Home Equipment: Bedside commode;Shower seat;Walker - 2 wheels;Cane - single point;Walker - 4 wheels;Grab bars - tub/shower   Additional Comments: Wife has dementia, pt has an aide who comes in 3x a week       Prior Functioning/Environment Level of Independence: Independent with assistive device(s)        Comments: Uses RW for ambulation         OT Problem List: Decreased strength;Decreased range of motion;Decreased activity tolerance;Impaired balance (sitting and/or standing);Decreased cognition;Decreased safety awareness;Pain      OT Treatment/Interventions: Self-care/ADL training;Therapeutic exercise;Therapeutic activities;Cognitive remediation/compensation;Patient/family education;Balance training    OT Goals(Current goals can be found in the care plan section) Acute Rehab OT Goals Patient Stated Goal: to be able to sit up for awhile OT Goal Formulation: With patient Time For Goal Achievement: 10/17/19 Potential to Achieve Goals: Good ADL Goals Pt Will Perform Upper Body Dressing:  Independently;sitting Pt Will Perform Lower Body Dressing: with set-up;sit to/from stand Pt Will Transfer to Toilet: with supervision;ambulating;regular height toilet Pt Will Perform Tub/Shower Transfer: with supervision;ambulating;shower seat;grab bars;rolling walker  OT Frequency: Min 2X/week   Barriers to D/C:            Co-evaluation PT/OT/SLP Co-Evaluation/Treatment: Yes Reason for Co-Treatment: Complexity of the patient's impairments (multi-system involvement);For patient/therapist safety;To address functional/ADL transfers   OT goals addressed during session: ADL's and self-care      AM-PAC OT "6 Clicks" Daily Activity     Outcome Measure Help from another person eating meals?: None Help from another person taking care of personal grooming?: A Little Help from another person toileting, which includes using toliet, bedpan, or urinal?: A Little Help from another person bathing (including washing, rinsing, drying)?: A Lot Help from another person to put on and taking off regular upper body clothing?: A Little Help from another person to put on and taking off regular lower body clothing?: A Lot 6 Click Score: 17   End of Session Equipment Utilized During Treatment: Gait belt;Rolling walker Nurse Communication: Other (comment);Mobility status (finding in session with decrease in awareness of saftey)  Activity Tolerance: Patient tolerated treatment well Patient left: in chair;with call bell/phone within reach;with chair alarm set  OT Visit Diagnosis: Unsteadiness on feet (R26.81);Other abnormalities of gait and mobility (R26.89);Repeated falls (R29.6);Muscle weakness (generalized) (M62.81);Pain Pain - Right/Left: Right Pain - part of body: Leg  Time: 2883-3744 OT Time Calculation (min): 18 min Charges:  OT General Charges $OT Visit: 1 Visit OT Evaluation $OT Eval Low Complexity: [place] OTR/L  Acute Rehab Services  [phone] office  number [phone] pager number   Joeseph Amor 09/26/2019, 12:42 PM

## 2019-09-26 NOTE — Evaluation (Signed)
Physical Therapy Evaluation Patient Details Name: Anthony Dunlap MRN: [id number] DOB: Jun 29, 1933 Today's Date: 09/26/2019   History of Present Illness  Pt is a 86 yr old male who presented due to bleeding and nonhealing heel ulcers. PMH but not limited to  pelvic/right hip osteomyelitis, prostate ca, hernia, R total hip arthroplasty   Clinical Impression  Patient required mod a for balance and safety to get to the chair. He requires assist and guarding for safety with all transfers. He will have a BKA on 09/29/2019. He will need a re-assessment at this time. A t this time 2nd to safety and decreased mobility he would benefit most from rehab at a SNF v home health. Skilled therapy will continue to follow him and re-assess following BKA for updated mobility assessment.     Follow Up Recommendations SNF vs Home health PT;Supervision/Assistance - 24 hour    Equipment Recommendations  None recommended by PT    Recommendations for Other Services Rehab consult     Precautions / Restrictions Precautions Precautions: Fall Precaution Comments: pt was very impulsive Restrictions Weight Bearing Restrictions: No      Mobility  Bed Mobility Overal bed mobility: Needs Assistance Bed Mobility: Supine to Sit     Supine to sit: Min guard;HOB elevated     General bed mobility comments: impulsvie. attmepted to sit before room set up.   Transfers Overall transfer level: Needs assistance Equipment used: Rolling walker (2 wheeled) Transfers: Sit to/from Stand Sit to Stand: Min assist;+2 physical assistance;+2 safety/equipment         General transfer comment: required min a for balance and mod cuing for safety with transfer from sit to stand   Ambulation/Gait Ambulation/Gait assistance: Mod assist Gait Distance (Feet): 6 Feet Assistive device: Rolling walker (2 wheeled) Gait Pattern/deviations: Step-to pattern     General Gait Details: mod a for balance and safety. Mod cuing and assist  to stay in the walker. Attmepted to sit before he was at the chair   Stairs            Wheelchair Mobility    Modified Rankin (Stroke Patients Only)       Balance Overall balance assessment: Needs assistance Sitting-balance support: Bilateral upper extremity supported;No upper extremity supported Sitting balance-Leahy Scale: Fair     Standing balance support: Bilateral upper extremity supported Standing balance-Leahy Scale: Poor Standing balance comment: needed walker and assist to stand                              Pertinent Vitals/Pain Pain Assessment: Faces Faces Pain Scale: Hurts a little bit Pain Location: right foot  Pain Descriptors / Indicators: Aching;Grimacing Pain Intervention(s): Limited activity within patient's tolerance;Monitored during session;Patient requesting pain meds-RN notified    Home Living Family/patient expects to be discharged to:: Private residence Living Arrangements: Spouse/significant other;Other relatives Available Help at Discharge: Family;Available PRN/intermittently Type of Home: House Home Access: Ramped entrance     Home Layout: One level Home Equipment: Bedside commode;Shower seat;Walker - 2 wheels;Cane - single point;Walker - 4 wheels;Grab bars - tub/shower Additional Comments: Wife has dementia, pt has an aide who comes in 3x a week     Prior Function Level of Independence: Independent with assistive device(s)         Comments: Uses RW for ambulatio;' has a n     Hand Dominance   Dominant Hand: Right    Extremity/Trunk Assessment   Upper Extremity  Assessment Upper Extremity Assessment: Defer to OT evaluation    Lower Extremity Assessment Lower Extremity Assessment: Generalized weakness    Cervical / Trunk Assessment Cervical / Trunk Assessment: Kyphotic  Communication   Communication: No difficulties  Cognition Arousal/Alertness: Awake/alert Behavior During Therapy: Impulsive Overall  Cognitive Status: Difficult to assess                                 General Comments: pt cues for saftey throughout session      General Comments      Exercises     Assessment/Plan    PT Assessment Patient needs continued PT services  PT Problem List Decreased strength;Decreased range of motion;Decreased activity tolerance;Decreased balance;Decreased mobility;Decreased coordination;Decreased knowledge of use of DME;Decreased safety awareness;Pain       PT Treatment Interventions DME instruction;Gait training;Functional mobility training;Therapeutic activities;Therapeutic exercise;Neuromuscular re-education;Balance training;Patient/family education    PT Goals (Current goals can be found in the Care Plan section)  Acute Rehab PT Goals Patient Stated Goal: to be able to sit up for awhile PT Goal Formulation: With patient Time For Goal Achievement: 10/03/19 Potential to Achieve Goals: Good    Frequency Min 3X/week   Barriers to discharge        Co-evaluation PT/OT/SLP Co-Evaluation/Treatment: Yes Reason for Co-Treatment: Complexity of the patient's impairments (multi-system involvement);Necessary to address cognition/behavior during functional activity;For patient/therapist safety;To address functional/ADL transfers PT goals addressed during session: Mobility/safety with mobility;Balance;Proper use of DME;Strengthening/ROM OT goals addressed during session: ADL's and self-care       AM-PAC PT "6 Clicks" Mobility  Outcome Measure Help needed turning from your back to your side while in a flat bed without using bedrails?: A Lot Help needed moving from lying on your back to sitting on the side of a flat bed without using bedrails?: A Lot Help needed moving to and from a bed to a chair (including a wheelchair)?: A Lot Help needed standing up from a chair using your arms (e.g., wheelchair or bedside chair)?: A Lot Help needed to walk in hospital room?: A  Lot Help needed climbing 3-5 steps with a railing? : Total 6 Click Score: 11    End of Session Equipment Utilized During Treatment: Gait belt Activity Tolerance: Patient tolerated treatment well Patient left: in bed;in chair Nurse Communication: Mobility status;Need for lift equipment;Patient requests pain meds PT Visit Diagnosis: Unsteadiness on feet (R26.81);Other abnormalities of gait and mobility (R26.89);Difficulty in walking, not elsewhere classified (R26.2)    Time: 3383-2919 PT Time Calculation (min) (ACUTE ONLY): 18 min   Charges:   PT Evaluation $PT Eval Moderate Complexity: 1 Mod            Carney Living PT DPT  09/26/2019, 1:01 PM

## 2019-09-26 NOTE — Progress Notes (Signed)
Initial Nutrition Assessment  INTERVENTION:   -Prosource Plus PO BID, each provides 100 kcals and 15g protein  -1 packet Juven BID, each packet provides 95 calories, 2.5 grams of protein (collagen), and 9.8 grams of carbohydrate (3 grams sugar); also contains 7 grams of L-arginine and L-glutamine, 300 mg vitamin C, 15 mg vitamin E, 1.2 mcg vitamin B-12, 9.5 mg zinc, 200 mg calcium, and 1.5 g  Calcium Beta-hydroxy-Beta-methylbutyrate to support wound healing  -Multivitamin with minerals daily  NUTRITION DIAGNOSIS:   Increased nutrient needs related to wound healing as evidenced by estimated needs.  GOAL:   Patient will meet greater than or equal to 90% of their needs  MONITOR:   PO intake, Supplement acceptance, Labs, Weight trends, I & O's, Skin  REASON FOR ASSESSMENT:   Consult Wound healing  ASSESSMENT:   86 y.o. male, past medical history of diabetes mellitus, prostate cancer, aortic angioplasty, peripheral vascular disease following with [name], history of pelvic/right hip osteomyelitis on chronic antibiotic suppressive therapy for him by Dr. Dede Query presents to ED secondary to bleeding, and nonhealing heel ulcers, patient has bilateral heel ulcers, but report he has been having trouble with the right heel ulcer, which has been bleeding. Admitted for acute osteomyelitis of right heel.  Pt unavailable to speak with RD at this time.  Patient admitted following a chronic wound on his right heel began to bleed. Per vascular surgery note, pt now scheduled to have right BKA next week. No PO documented at this time. Pt received a breakfast tray of eggs, sausage and blueberry muffin.  Will order supplements to aid in wound healing.  Per weight records, weight has remained stable. Pt weighed 170 lbs in October 2020. Current weight: 172 lbs.  Medications: Ferrous sulfate, Lasix Labs reviewed: CBGs: 57-113  NUTRITION - FOCUSED PHYSICAL EXAM:  Unable to complete at this  time.  Diet Order:   Diet Order            Diet Carb Modified Fluid consistency: Thin; Room service appropriate? Yes  Diet effective now                 EDUCATION NEEDS:   No education needs have been identified at this time  Skin:  Skin Assessment: Skin Integrity Issues: Skin Integrity Issues:: Diabetic Ulcer Diabetic Ulcer: bilateral heels  Last BM:  PTA  Height:   Ht Readings from Last 1 Encounters:  09/25/19 5\' 8"  (1.727 m)    Weight:   Wt Readings from Last 1 Encounters:  09/25/19 78 kg   BMI:  Body mass index is 26.15 kg/m.  Estimated Nutritional Needs:   Kcal:  1850-2050  Protein:  90-100g  Fluid:  2L/day  Clayton Bibles, MS, RD, LDN Inpatient Clinical Dietitian Contact information available via Amion

## 2019-09-26 NOTE — Consult Note (Signed)
[facility]  Patient name: Anthony Dunlap MRN: [id number] DOB: [birth year] Sex: male   REQUESTING PROVIDER:   Hospital service   REASON FOR CONSULT:    Bilateral heel ulcers  HISTORY OF PRESENT ILLNESS:   Anthony Dunlap is a 86 y.o. male, who presented to the [facility] emergency department for further evaluation of bilateral heel ulcers with bleeding on the right.  There is also a new foul odor to the right foot.  The patient has been followed by [name].  He has undergone angiography.  He does not have options for revascularization on the right.  Below-knee amputation has been recommended but the patient was not yet ready to proceed.  He says he is now prepared.  The patient takes a statin for hypercholesterolemia.  He is on aspirin and Plavix for dual antiplatelet therapy.  He is medically managed for diabetes.  He is a non-smoker  PAST MEDICAL HISTORY    Past Medical History:  Diagnosis Date  . 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 Diabetes ([place])   . Drug-induced hepatitis 06/05/2018  . Inguinal hernia    06/09/2019: per patient " a long time ago on the right side"  . Left eye pain   . Localized osteoarthrosis of right hip 01/05/2019  . Prostate cancer ([place])      FAMILY HISTORY   Family History  Problem Relation Age of Onset  . Pneumonia Father   . Cancer Sister     SOCIAL HISTORY:   Social History   Socioeconomic History  . Marital status: Married    Spouse name: [name]  . Number of children: 3  . Years of education: 9th  . Highest education level: Not on file  Occupational History    Employer: RETIRED    Comment: Retired  Tobacco Use  . Smoking status: Never Smoker  . Smokeless tobacco: Never Used  Vaping Use  . Vaping Use: Never used  Substance and Sexual Activity  . Alcohol use: No  . Drug use: No  . Sexual activity: Not on file  Other Topics  Concern  . Not on file  Social History Narrative   Patient lives at home with his wife. [name]) . Patient is retired.   Education 9th grade.   Right handed.   Caffeine None   Social Determinants of Health   Financial Resource Strain:   . Difficulty of Paying Living Expenses:   Food Insecurity:   . Worried About Charity fundraiser in the Last Year:   . Arboriculturist in the Last Year:   Transportation Needs:   . Film/video editor (Medical):   [name] Lack of Transportation (Non-Medical):   Physical Activity:   . Days of Exercise per Week:   . Minutes of Exercise per Session:   Stress:   . Feeling of Stress :   Social Connections:   . Frequency of Communication with Friends and Family:   . Frequency of Social Gatherings with Friends and Family:   . Attends Religious Services:   . Active Member of Clubs or Organizations:   . Attends Archivist Meetings:   [name] Marital Status:   Intimate Partner Violence:   . Fear of Current or Ex-Partner:   . Emotionally Abused:   [name] Physically Abused:   . Sexually Abused:     ALLERGIES:    Allergies  Allergen Reactions  . Fluconazole Other (See  Comments)    Drug-induced hepatitis    CURRENT MEDICATIONS:    Current Facility-Administered Medications  Medication Dose Route Frequency Provider Last Rate Last Admin  . acetaminophen (TYLENOL) tablet 650 mg  650 mg Oral Q6H PRN Elgergawy, Silver Huguenin, MD       Or  . acetaminophen (TYLENOL) suppository 650 mg  650 mg Rectal Q6H PRN Elgergawy, Silver Huguenin, MD      . ALPRAZolam Duanne Moron) tablet 0.5 mg  0.5 mg Oral BID Elgergawy, Silver Huguenin, MD   0.5 mg at 09/26/19 0913  . amitriptyline (ELAVIL) tablet 10 mg  10 mg Oral QHS Elgergawy, Silver Huguenin, MD   10 mg at 09/25/19 2156  . aspirin EC tablet 81 mg  81 mg Oral Daily Elgergawy, Silver Huguenin, MD   81 mg at 09/26/19 0913  . cefTRIAXone (ROCEPHIN) 2 g in sodium chloride 0.9 % 100 mL IVPB  2 g Intravenous Q24H Elgergawy, Silver Huguenin, MD      .  clopidogrel (PLAVIX) tablet 75 mg  75 mg Oral Daily Elgergawy, Silver Huguenin, MD   75 mg at 09/26/19 0913  . colchicine tablet 0.6 mg  0.6 mg Oral Daily PRN Elgergawy, Silver Huguenin, MD      . ferrous sulfate tablet 325 mg  325 mg Oral Q breakfast Elgergawy, Silver Huguenin, MD   325 mg at 09/26/19 0913  . furosemide (LASIX) tablet 20 mg  20 mg Oral Daily Elgergawy, Silver Huguenin, MD   20 mg at 09/26/19 0913  . glimepiride (AMARYL) tablet 4 mg  4 mg Oral Q breakfast Elgergawy, Silver Huguenin, MD   4 mg at 09/26/19 0917  . heparin injection 5,000 Units  5,000 Units Subcutaneous Q8H Elgergawy, Silver Huguenin, MD   5,000 Units at 09/26/19 0640  . insulin aspart (novoLOG) injection 0-15 Units  0-15 Units Subcutaneous TID WC Elgergawy, Emeline Gins S, MD      . insulin aspart (novoLOG) injection 0-5 Units  0-5 Units Subcutaneous QHS Elgergawy, Dawood S, MD      . insulin glargine (LANTUS) injection 20 Units  20 Units Subcutaneous Daily Elgergawy, Silver Huguenin, MD      . levothyroxine (SYNTHROID) tablet 100 mcg  100 mcg Oral q morning - 10a Elgergawy, Silver Huguenin, MD   100 mcg at 09/26/19 0641  . metroNIDAZOLE (FLAGYL) IVPB 500 mg  500 mg Intravenous Q8H Elgergawy, Dawood S, MD      . oxyCODONE (Oxy IR/ROXICODONE) immediate release tablet 5 mg  5 mg Oral Q4H PRN Elgergawy, Silver Huguenin, MD      . pravastatin (PRAVACHOL) tablet 10 mg  10 mg Oral q1800 Elgergawy, Silver Huguenin, MD      . primidone (MYSOLINE) tablet 150 mg  150 mg Oral QHS Elgergawy, Silver Huguenin, MD   150 mg at 09/25/19 2156  . [START ON 09/27/2019] vancomycin (VANCOCIN) IVPB 1000 mg/200 mL premix  1,000 mg Intravenous Q24H Esmond Plants, RPH      . vancomycin (VANCOREADY) IVPB 2000 mg/400 mL  2,000 mg Intravenous Once Esmond Plants, RPH        REVIEW OF SYSTEMS:   [X]  denotes positive finding, [ ]  denotes negative finding Cardiac  Comments:  Chest pain or chest pressure:    Shortness of breath upon exertion:    Short of breath when lying flat:    Irregular heart rhythm:        Vascular     Pain in calf, thigh, or hip brought on by ambulation:  Pain in feet at night that wakes you up from your sleep:     Blood clot in your veins:    Leg swelling:         Pulmonary    Oxygen at home:    Productive cough:     Wheezing:         Neurologic    Sudden weakness in arms or legs:     Sudden numbness in arms or legs:     Sudden onset of difficulty speaking or slurred speech:    Temporary loss of vision in one eye:     Problems with dizziness:         Gastrointestinal    Blood in stool:      Vomited blood:         Genitourinary    Burning when urinating:     Blood in urine:        Psychiatric    Major depression:         Hematologic    Bleeding problems:    Problems with blood clotting too easily:        Skin    Rashes or ulcers: x       Constitutional    Fever or chills:     PHYSICAL EXAM:   Vitals:   09/25/19 1759 09/25/19 1929 09/26/19 0349 09/26/19 0808  BP: (!) 154/89 (!) 176/110 (!) 145/82 116/83  Pulse: 78 (!) 101 96 (!) 107  Resp: 16 18 17 16   Temp: 98 F (36.7 C) (!) 97.3 F (36.3 C) (!) 97.5 F (36.4 C) 99.1 F (37.3 C)  TempSrc: Oral Oral Oral Oral  SpO2: 100% 100% 100% 100%  Weight:      Height:        GENERAL: The patient is a well-nourished male, in no acute distress. The vital signs are documented above. CARDIAC: There is a regular rate and rhythm.  PULMONARY: Nonlabored respirations ABDOMEN: Soft and non-tender MUSCULOSKELETAL: There are no major deformities or cyanosis. NEUROLOGIC: No focal weakness or paresthesias are detected. SKIN: See photos below PSYCHIATRIC: The patient has a normal affect.      STUDIES:   None  ASSESSMENT and PLAN   The patient is now agreeable to right below-knee amputation.  We will try to get this scheduled early next week  Continue IV antibiotics and daily dressing changes   Leia Alf, MD, FACS [facility] [phone] Pager [phone]

## 2019-09-27 LAB — CBC
HCT: 29.6 % — ABNORMAL LOW (ref 39.0–52.0)
Hemoglobin: 9.1 g/dL — ABNORMAL LOW (ref 13.0–17.0)
MCH: 25.9 pg — ABNORMAL LOW (ref 26.0–34.0)
MCHC: 30.7 g/dL (ref 30.0–36.0)
MCV: 84.3 fL (ref 80.0–100.0)
Platelets: 382 10*3/uL (ref 150–400)
RBC: 3.51 MIL/uL — ABNORMAL LOW (ref 4.22–5.81)
RDW: 13.7 % (ref 11.5–15.5)
WBC: 7.7 10*3/uL (ref 4.0–10.5)
nRBC: 0 % (ref 0.0–0.2)

## 2019-09-27 LAB — BASIC METABOLIC PANEL
Anion gap: 10 (ref 5–15)
BUN: 28 mg/dL — ABNORMAL HIGH (ref 8–23)
CO2: 24 mmol/L (ref 22–32)
Calcium: 9 mg/dL (ref 8.9–10.3)
Chloride: 103 mmol/L (ref 98–111)
Creatinine, Ser: 0.95 mg/dL (ref 0.61–1.24)
GFR calc Af Amer: >60 mL/min (ref >60)
GFR calc non Af Amer: >60 mL/min (ref >60)
Glucose, Bld: 105 mg/dL — ABNORMAL HIGH (ref 70–99)
Potassium: 4.2 mmol/L (ref 3.5–5.1)
Sodium: 137 mmol/L (ref 135–145)

## 2019-09-27 LAB — GLUCOSE, CAPILLARY
Glucose-Capillary: 269 mg/dL — ABNORMAL HIGH (ref 70–99)
Glucose-Capillary: 82 mg/dL (ref 70–99)
Glucose-Capillary: 272 mg/dL — ABNORMAL HIGH (ref 70–99)
Glucose-Capillary: 132 mg/dL — ABNORMAL HIGH (ref 70–99)

## 2019-09-27 MED ORDER — CEFAZOLIN SODIUM-DEXTROSE 1-4 GM/50ML-% IV SOLN
1.0000 g | INTRAVENOUS | Status: DC
  Filled 2019-09-27: qty 50

## 2019-09-27 MED ORDER — INSULIN GLARGINE 100 UNIT/ML ~~LOC~~ SOLN
14.0000 [IU] | Freq: Every day | SUBCUTANEOUS | Status: DC
  Administered 2019-09-29: 14 [IU] via SUBCUTANEOUS
  Filled 2019-09-27 (×2): qty 0.14

## 2019-09-27 NOTE — Progress Notes (Signed)
PROGRESS NOTE    Anthony Dunlap  QMV:[id number] DOB: [birth year] DOA: 09/25/2019 PCP: [name], MD  Brief Narrative: Anthony Dunlap  is a 86 y.o. male, past medical history of diabetes mellitus, prostate cancer, aortic angioplasty, peripheral vascular disease following with [name], history of pelvic/right hip osteomyelitis on chronic antibiotic suppressive therapy for him by [name], Patient presented to ED secondary to bleeding from nonhealing heel ulcers which are chronic due to severe peripheral vascular disease, followed by vascular surgery previously underwent angiogram and did not have revascularization options on the right, BKA was recommended at the time but patient does not ready to proceed. - in ED initial x-ray of bilateral feet, concerning for osteomyelitis of right heel, so MRI right foot was obtained which confirmed osteomyelitis, no leukocytosis, no fever, patient was started empirically on Rocephin, Flagyl and vancomycin -VVS consulted, now BKA being planned   Assessment & Plan:   Acute osteomyelitis of right foot Severe peripheral vascular disease -MRI confirms osteomyelitis of the calcaneus -no revascularization options on the right based on prior angiogram, underwent recent left anterior tibial artery angioplasty and unsuccessful antegrade recanalization of peroneal artery -Vascular surgery consulted and following -Plan for right BKA -Continue aspirin, will hold Plavix and DVT prophylaxis for surgery tomorrow  Diabetes mellitus -Oral hypoglycemics on hold  -Lantus resumed, decrease dose for surgery tomorrow -Hemoglobin A1c is 8.1  Left heel ulcer -No evidence of osteomyelitis of left foot on x-ray -Status post balloon angioplasty recently of left lower extremity, follow-up with vascular surgery  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 -Followed by infectious disease [name] as outpatient on suppressive chronic  antibiotic therapy including Omnicef, Augmentin and doxycycline in the past -Resume home antibiotic regimen at discharge  Chronic normocytic anemia -Hemoglobin slightly lower, monitor  DVT prophylaxis: Heparin subcutaneous-hold this for surgery Code Status: Full code Family Communication: No family at bedside Disposition Plan:  Status is: Inpatient  Remains inpatient appropriate because:Inpatient level of care appropriate due to severity of illness  Dispo: The patient is from: Home              Anticipated d/c is to: To be determined              Anticipated d/c date is: 3 days              Patient currently is not medically stable to d/c.   Consultants:  Vascular surgery  Procedures:   Subjective: -No issues overnight, has mild discomfort in his right heel otherwise doing fairly well   Objective: Vitals:   09/26/19 0808 09/26/19 1345 09/26/19 1951 09/27/19 0641  BP: 116/83 128/81 (!) 142/79 133/81  Pulse: (!) 107 93 96 96  Resp: 16 18  16   Temp: 99.1 F (37.3 C) 97.8 F (36.6 C) 98.1 F (36.7 C) 97.9 F (36.6 C)  TempSrc: Oral Oral Oral Oral  SpO2: 100% 100% 97% 98%  Weight:      Height:        Intake/Output Summary (Last 24 hours) at 09/27/2019 1441 Last data filed at 09/27/2019 1340 Gross per 24 hour  Intake 550.54 ml  Output 751 ml  Net -200.46 ml   Filed Weights   09/25/19 1037  Weight: 78 kg    Examination:  General exam: Chronically ill elderly male sitting up in bed, AAOx3, no distress HEENT: No JVD CVS: S1-S2, regular rate rhythm Lungs clear Abdomen soft, obese, nondistended, bowel sounds present  Extremities: No edema, deep bilateral  heel ulcers with dressing  Skin : Extensive deep heel ulcers with dressing on the R foot Psychiatry:  Mood & affect appropriate.   Data Reviewed:   CBC: Recent Labs  Lab 09/25/19 1549 09/26/19 0118 09/27/19 0231  WBC 10.9* 8.7 7.7  NEUTROABS 9.0*  --   --   HGB 10.1* 8.9* 9.1*  HCT 32.9* 28.4* 29.6*   MCV 86.4 83.8 84.3  PLT 431* 357 130   Basic Metabolic Panel: Recent Labs  Lab 09/25/19 1549 09/25/19 1957 09/26/19 0118 09/27/19 0231  NA 136  --  138 137  K 4.6  --  3.8 4.2  CL 102  --  103 103  CO2 23  --  22 24  GLUCOSE 146*  --  122* 105*  BUN 30*  --  28* 28*  CREATININE 1.15 1.20 1.18 0.95  CALCIUM 9.0  --  8.7* 9.0   GFR: Estimated Creatinine Clearance: 56 mL/min (by C-G formula based on SCr of 0.95 mg/dL). Liver Function Tests: No results for input(s): AST, ALT, ALKPHOS, BILITOT, PROT, ALBUMIN in the last 168 hours. No results for input(s): LIPASE, AMYLASE in the last 168 hours. No results for input(s): AMMONIA in the last 168 hours. Coagulation Profile: No results for input(s): INR, PROTIME in the last 168 hours. Cardiac Enzymes: No results for input(s): CKTOTAL, CKMB, CKMBINDEX, TROPONINI in the last 168 hours. BNP (last 3 results) No results for input(s): PROBNP in the last 8760 hours. HbA1C: Recent Labs    09/25/19 1554  HGBA1C 8.1*   CBG: Recent Labs  Lab 09/26/19 0628 09/26/19 1141 09/26/19 1615 09/26/19 2015 09/27/19 0637  GLUCAP 57* 182* 146* 269* 82   Lipid Profile: No results for input(s): CHOL, HDL, LDLCALC, TRIG, CHOLHDL, LDLDIRECT in the last 72 hours. Thyroid Function Tests: No results for input(s): TSH, T4TOTAL, FREET4, T3FREE, THYROIDAB in the last 72 hours. Anemia Panel: No results for input(s): VITAMINB12, FOLATE, FERRITIN, TIBC, IRON, RETICCTPCT in the last 72 hours. Urine analysis:    Component Value Date/Time   COLORURINE YELLOW 06/12/2019 0822   APPEARANCEUR CLEAR 06/12/2019 0822   LABSPEC 1.008 06/12/2019 0822   PHURINE 7.0 06/12/2019 0822   GLUCOSEU NEGATIVE 06/12/2019 0822   HGBUR NEGATIVE 06/12/2019 0822   BILIRUBINUR NEGATIVE 06/12/2019 0822   KETONESUR NEGATIVE 06/12/2019 0822   PROTEINUR NEGATIVE 06/12/2019 0822   UROBILINOGEN 0.2 09/03/2013 0132   NITRITE POSITIVE (A) 06/12/2019 0822   LEUKOCYTESUR LARGE (A)  06/12/2019 0822   Sepsis Labs: @LABRCNTIP (procalcitonin:4,lacticidven:4)  ) Recent Results (from the past 240 hour(s))  SARS Coronavirus 2 by RT PCR (hospital order, performed in [facility] hospital lab) Nasopharyngeal Nasopharyngeal Swab     Status: None   Collection Time: 09/25/19  6:20 PM   Specimen: Nasopharyngeal Swab  Result Value Ref Range Status   SARS Coronavirus 2 NEGATIVE NEGATIVE Final    Comment: (NOTE) SARS-CoV-2 target nucleic acids are NOT DETECTED.  The SARS-CoV-2 RNA is generally detectable in upper and lower respiratory specimens during the acute phase of infection. The lowest concentration of SARS-CoV-2 viral copies this assay can detect is 250 copies / mL. A negative result does not preclude SARS-CoV-2 infection and should not be used as the sole basis for treatment or other patient management decisions.  A negative result may occur with improper specimen collection / handling, submission of specimen other than nasopharyngeal swab, presence of viral mutation(s) within the areas targeted by this assay, and inadequate number of viral copies (<250 copies / mL). A  negative result must be combined with clinical observations, patient history, and epidemiological information.  Fact Sheet for Patients:   StrictlyIdeas.no  Fact Sheet for Healthcare Providers: BankingDealers.co.za  This test is not yet approved or  cleared by the [place] FDA and has been authorized for detection and/or diagnosis of SARS-CoV-2 by FDA under an Emergency Use Authorization (EUA).  This EUA will remain in effect (meaning this test can be used) for the duration of the COVID-19 declaration under Section 564(b)(1) of the Act, 21 U.S.C. section 360bbb-3(b)(1), unless the authorization is terminated or revoked sooner.  Performed at [facility], [address], [place], [place] [address]   Surgical pcr screen     Status: None    Collection Time: 09/26/19  4:22 AM   Specimen: Nasal Mucosa; Nasal Swab  Result Value Ref Range Status   MRSA, PCR NEGATIVE NEGATIVE Final   Staphylococcus aureus NEGATIVE NEGATIVE Final    Comment: (NOTE) The Xpert SA Assay (FDA approved for NASAL specimens in patients 76 years of age and older), is one component of a comprehensive surveillance program. It is not intended to diagnose infection nor to guide or monitor treatment. Performed at [facility], [place] [address], [place], [place] [address]          Radiology Studies: MR FOOT RIGHT WO CONTRAST  Result Date: 09/25/2019 CLINICAL DATA:  Nonhealing heel wound.  Abnormal x-ray EXAM: MRI OF THE RIGHT HINDFOOT WITHOUT CONTRAST TECHNIQUE: Multiplanar, multisequence MR imaging of the right hindfoot was performed. No intravenous contrast was administered. COMPARISON:  X-ray 09/25/2019 FINDINGS: Technical note: Motion degraded exam. Bones/Joint/Cartilage There is cortical destruction involving the posterolateral cortex of the posterior calcaneus with extensive bone marrow edema and confluent low T1 signal compatible with acute osteomyelitis (series 7, images 7-10; series 9, images 17-21). Destructive changes involve the lateral most aspect of the Achilles insertion. The remaining visualized bony structures of the hindfoot remain intact. No fracture or dislocation. No subtalar or tibiotalar joint effusion. Ligaments The anterior and posterior tibiofibular ligaments are intact. The anterior and posterior talofibular ligaments are intact. Intact calcaneofibular ligament. Deltoid ligament and spring ligament complex intact. Muscles and Tendons There is tendinosis with interstitial tearing of the distal Achilles tendon, particularly along its lateral margin at site of osteomyelitis. No complete retracted tear. No retrocalcaneal bursal fluid. The peroneal tendons, posteromedial ankle tendons, and anterior ankle tendons appear intact. No  tenosynovitis. Soft tissues Large soft tissue ulceration at the posterolateral aspect of the hindfoot extending to the underlying bone of the posterior calcaneus. No organized fluid collection. There is surrounding soft tissue edema suggesting cellulitis. IMPRESSION: 1. Large soft tissue ulceration at the posterolateral aspect of the right hindfoot extending to the underlying bone of the posterior calcaneus. Acute osteomyelitis of the posterior calcaneus. 2. Tendinosis with partial-thickness tearing of the distal Achilles tendon, particularly along its lateral margin at site of calcaneal osteomyelitis. No complete retracted tear. Electronically Signed   By: Davina Poke D.O.   On: 09/25/2019 15:44        Scheduled Meds: . (feeding supplement) PROSource Plus  30 mL Oral BID WC  . amitriptyline  10 mg Oral QHS  . aspirin EC  81 mg Oral Daily  . Chlorhexidine Gluconate Cloth  6 each Topical Daily  . clopidogrel  75 mg Oral Daily  . ferrous sulfate  325 mg Oral Q breakfast  . heparin  5,000 Units Subcutaneous Q8H  . insulin aspart  0-15 Units Subcutaneous TID WC  .  insulin aspart  0-5 Units Subcutaneous QHS  . [START ON 09/28/2019] insulin glargine  14 Units Subcutaneous Daily  . levothyroxine  100 mcg Oral q morning - 10a  . multivitamin with minerals  1 tablet Oral Daily  . nutrition supplement (JUVEN)  1 packet Oral BID BM  . pravastatin  10 mg Oral q1800  . primidone  150 mg Oral QHS   Continuous Infusions: . cefTRIAXone (ROCEPHIN)  IV 2 g (09/26/19 2153)  . metronidazole 500 mg (09/27/19 1114)  . vancomycin 1,000 mg (09/27/19 0900)     LOS: 2 days    Time spent: 51min  Domenic Polite, MD [facility]  09/27/2019, 2:41 PM

## 2019-09-27 NOTE — Plan of Care (Signed)
°  Problem: Education: Goal: Knowledge of General Education information will improve Description: Including pain rating scale, medication(s)/side effects and non-pharmacologic comfort measures Outcome: Progressing   Problem: Health Behavior/Discharge Planning: Goal: Ability to manage health-related needs will improve Outcome: Progressing   Problem: Clinical Measurements: Goal: Will remain free from infection Outcome: Progressing   Problem: Activity: Goal: Risk for activity intolerance will decrease Outcome: Progressing   Problem: Pain Managment: Goal: General experience of comfort will improve Outcome: Progressing   Problem: Skin Integrity: Goal: Risk for impaired skin integrity will decrease Outcome: Progressing   

## 2019-09-27 NOTE — Progress Notes (Signed)
Subjective  -   No acute overnight events   Physical Exam:  Necrotic right heel   MRI consistent with osteomyelitis of the right calcaneus    Assessment/Plan:    We discussed proceeding with right below-knee amputation tomorrow.  He understands that [name] is not available and that this will be done by one of his partners.  He wants to go ahead and get this done so he can get moving on his recovery.  He will be n.p.o. after midnight.  Plavix was stopped today.  Anthony Dunlap 09/27/2019 5:36 PM --  Vitals:   09/27/19 0641 09/27/19 1500  BP: 133/81 (!) 143/88  Pulse: 96 100  Resp: 16 17  Temp: 97.9 F (36.6 C) 98 F (36.7 C)  SpO2: 98% 97%    Intake/Output Summary (Last 24 hours) at 09/27/2019 1736 Last data filed at 09/27/2019 1500 Gross per 24 hour  Intake 950.54 ml  Output 751 ml  Net 199.54 ml     Laboratory CBC    Component Value Date/Time   WBC 7.7 09/27/2019 0231   HGB 9.1 (L) 09/27/2019 0231   HCT 29.6 (L) 09/27/2019 0231   PLT 382 09/27/2019 0231    BMET    Component Value Date/Time   NA 137 09/27/2019 0231   K 4.2 09/27/2019 0231   CL 103 09/27/2019 0231   CO2 24 09/27/2019 0231   GLUCOSE 105 (H) 09/27/2019 0231   BUN 28 (H) 09/27/2019 0231   CREATININE 0.95 09/27/2019 0231   CREATININE 1.19 (H) 08/20/2019 1319   CALCIUM 9.0 09/27/2019 0231   GFRNONAA >60 09/27/2019 0231   GFRNONAA 56 (L) 08/20/2019 1319   GFRAA >60 09/27/2019 0231   GFRAA 65 08/20/2019 1319    COAG Lab Results  Component Value Date   INR 1.0 09/12/2018   INR 1.0 09/11/2018   INR 1.1 05/08/2018   No results found for: PTT  Antibiotics Anti-infectives (From admission, onward)   Start     Dose/Rate Route Frequency Ordered Stop   09/27/19 0900  vancomycin (VANCOCIN) IVPB 1000 mg/200 mL premix     Discontinue     1,000 mg 200 mL/hr over 60 Minutes Intravenous Every 24 hours 09/26/19 0828     09/26/19 1900  cefTRIAXone (ROCEPHIN) 2 g in sodium chloride 0.9 %  100 mL IVPB     Discontinue     2 g 200 mL/hr over 30 Minutes Intravenous Every 24 hours 09/25/19 1701     09/26/19 1800  metroNIDAZOLE (FLAGYL) IVPB 500 mg     Discontinue     500 mg 100 mL/hr over 60 Minutes Intravenous Every 8 hours 09/25/19 1701     09/26/19 1800  vancomycin (VANCOCIN) IVPB 1000 mg/200 mL premix  Status:  Discontinued        1,000 mg 200 mL/hr over 60 Minutes Intravenous Every 24 hours 09/25/19 1709 09/26/19 0829   09/26/19 0900  vancomycin (VANCOREADY) IVPB 2000 mg/400 mL        2,000 mg 200 mL/hr over 120 Minutes Intravenous  Once 09/26/19 0828 09/26/19 1251   09/25/19 1715  vancomycin (VANCOREADY) IVPB 2000 mg/400 mL  Status:  Discontinued        2,000 mg 200 mL/hr over 120 Minutes Intravenous  Once 09/25/19 1703 09/26/19 0829   09/25/19 1600  cefTRIAXone (ROCEPHIN) 2 g in sodium chloride 0.9 % 100 mL IVPB        2 g 200 mL/hr over 30 Minutes Intravenous  Once 09/25/19 1559 09/25/19 1824       V. Leia Alf, M.D., [facility] [place] Office: [phone] Pager:  [phone]

## 2019-09-27 NOTE — Progress Notes (Signed)
Current Palliative:  Manufacturing engineer (ACC) Community Based Palliative Care        This patient is enrolled in our palliative care services in the community.  ACC will continue to follow for any discharge planning needs and to coordinate continuation of palliative care.   If you have questions or need assistance, please call [phone] or contact the hospital Liaison listed on AMION.        Domenic Moras, BSN, RN [facility] Liaison   [phone] (24h on call)

## 2019-09-27 NOTE — H&P (View-Only) (Signed)
Subjective  -   No acute overnight events   Physical Exam:  Necrotic right heel   MRI consistent with osteomyelitis of the right calcaneus    Assessment/Plan:    We discussed proceeding with right below-knee amputation tomorrow.  He understands that [name] is not available and that this will be done by one of his partners.  He wants to go ahead and get this done so he can get moving on his recovery.  He will be n.p.o. after midnight.  Plavix was stopped today.  Anthony Dunlap 09/27/2019 5:36 PM --  Vitals:   09/27/19 0641 09/27/19 1500  BP: 133/81 (!) 143/88  Pulse: 96 100  Resp: 16 17  Temp: 97.9 F (36.6 C) 98 F (36.7 C)  SpO2: 98% 97%    Intake/Output Summary (Last 24 hours) at 09/27/2019 1736 Last data filed at 09/27/2019 1500 Gross per 24 hour  Intake 950.54 ml  Output 751 ml  Net 199.54 ml     Laboratory CBC    Component Value Date/Time   WBC 7.7 09/27/2019 0231   HGB 9.1 (L) 09/27/2019 0231   HCT 29.6 (L) 09/27/2019 0231   PLT 382 09/27/2019 0231    BMET    Component Value Date/Time   NA 137 09/27/2019 0231   K 4.2 09/27/2019 0231   CL 103 09/27/2019 0231   CO2 24 09/27/2019 0231   GLUCOSE 105 (H) 09/27/2019 0231   BUN 28 (H) 09/27/2019 0231   CREATININE 0.95 09/27/2019 0231   CREATININE 1.19 (H) 08/20/2019 1319   CALCIUM 9.0 09/27/2019 0231   GFRNONAA >60 09/27/2019 0231   GFRNONAA 56 (L) 08/20/2019 1319   GFRAA >60 09/27/2019 0231   GFRAA 65 08/20/2019 1319    COAG Lab Results  Component Value Date   INR 1.0 09/12/2018   INR 1.0 09/11/2018   INR 1.1 05/08/2018   No results found for: PTT  Antibiotics Anti-infectives (From admission, onward)   Start     Dose/Rate Route Frequency Ordered Stop   09/27/19 0900  vancomycin (VANCOCIN) IVPB 1000 mg/200 mL premix     Discontinue     1,000 mg 200 mL/hr over 60 Minutes Intravenous Every 24 hours 09/26/19 0828     09/26/19 1900  cefTRIAXone (ROCEPHIN) 2 g in sodium chloride 0.9 %  100 mL IVPB     Discontinue     2 g 200 mL/hr over 30 Minutes Intravenous Every 24 hours 09/25/19 1701     09/26/19 1800  metroNIDAZOLE (FLAGYL) IVPB 500 mg     Discontinue     500 mg 100 mL/hr over 60 Minutes Intravenous Every 8 hours 09/25/19 1701     09/26/19 1800  vancomycin (VANCOCIN) IVPB 1000 mg/200 mL premix  Status:  Discontinued        1,000 mg 200 mL/hr over 60 Minutes Intravenous Every 24 hours 09/25/19 1709 09/26/19 0829   09/26/19 0900  vancomycin (VANCOREADY) IVPB 2000 mg/400 mL        2,000 mg 200 mL/hr over 120 Minutes Intravenous  Once 09/26/19 0828 09/26/19 1251   09/25/19 1715  vancomycin (VANCOREADY) IVPB 2000 mg/400 mL  Status:  Discontinued        2,000 mg 200 mL/hr over 120 Minutes Intravenous  Once 09/25/19 1703 09/26/19 0829   09/25/19 1600  cefTRIAXone (ROCEPHIN) 2 g in sodium chloride 0.9 % 100 mL IVPB        2 g 200 mL/hr over 30 Minutes Intravenous  Once 09/25/19 1559 09/25/19 1824       V. Leia Alf, M.D., [facility] [place] Office: [phone] Pager:  [phone]

## 2019-09-28 ENCOUNTER — Encounter (HOSPITAL_COMMUNITY)
Admission: EM | Disposition: A | Discharge status: Skilled Nursing Facility | Payer: Self-pay | Source: Home / Self Care | Attending: Internal Medicine

## 2019-09-28 ENCOUNTER — Inpatient Hospital Stay (HOSPITAL_COMMUNITY): Payer: Medicare Other | Admitting: Certified Registered Nurse Anesthetist

## 2019-09-28 DIAGNOSIS — I70261 Atherosclerosis of native arteries of extremities with gangrene, right leg: Secondary | ICD-10-CM

## 2019-09-28 HISTORY — PX: AMPUTATION: SHX166

## 2019-09-28 LAB — GLUCOSE, CAPILLARY
Glucose-Capillary: 181 mg/dL — ABNORMAL HIGH (ref 70–99)
Glucose-Capillary: 81 mg/dL (ref 70–99)
Glucose-Capillary: 116 mg/dL — ABNORMAL HIGH (ref 70–99)
Glucose-Capillary: 104 mg/dL — ABNORMAL HIGH (ref 70–99)
Glucose-Capillary: 386 mg/dL — ABNORMAL HIGH (ref 70–99)
Glucose-Capillary: 124 mg/dL — ABNORMAL HIGH (ref 70–99)

## 2019-09-28 LAB — CBC
HCT: 28.6 % — ABNORMAL LOW (ref 39.0–52.0)
Hemoglobin: 8.8 g/dL — ABNORMAL LOW (ref 13.0–17.0)
MCH: 26.1 pg (ref 26.0–34.0)
MCHC: 30.8 g/dL (ref 30.0–36.0)
MCV: 84.9 fL (ref 80.0–100.0)
Platelets: 383 10*3/uL (ref 150–400)
RBC: 3.37 MIL/uL — ABNORMAL LOW (ref 4.22–5.81)
RDW: 13.7 % (ref 11.5–15.5)
WBC: 8.4 10*3/uL (ref 4.0–10.5)
nRBC: 0 % (ref 0.0–0.2)

## 2019-09-28 LAB — PROTIME-INR
INR: 1.1 (ref 0.8–1.2)
Prothrombin Time: 13.7 seconds (ref 11.4–15.2)

## 2019-09-28 LAB — BASIC METABOLIC PANEL
Anion gap: 11 (ref 5–15)
BUN: 27 mg/dL — ABNORMAL HIGH (ref 8–23)
CO2: 23 mmol/L (ref 22–32)
Calcium: 8.9 mg/dL (ref 8.9–10.3)
Chloride: 104 mmol/L (ref 98–111)
Creatinine, Ser: 0.98 mg/dL (ref 0.61–1.24)
GFR calc Af Amer: >60 mL/min (ref >60)
GFR calc non Af Amer: >60 mL/min (ref >60)
Glucose, Bld: 67 mg/dL — ABNORMAL LOW (ref 70–99)
Potassium: 3.7 mmol/L (ref 3.5–5.1)
Sodium: 138 mmol/L (ref 135–145)

## 2019-09-28 SURGERY — AMPUTATION BELOW KNEE
Anesthesia: Regional | Site: Knee | Laterality: Right

## 2019-09-28 MED ORDER — BUPIVACAINE LIPOSOME 1.3 % IJ SUSP
INTRAMUSCULAR | Status: DC | PRN
  Administered 2019-09-28: 10 mL via PERINEURAL

## 2019-09-28 MED ORDER — PHENYLEPHRINE HCL-NACL 10-0.9 MG/250ML-% IV SOLN
INTRAVENOUS | Status: DC | PRN
  Administered 2019-09-28: 20 ug/min via INTRAVENOUS

## 2019-09-28 MED ORDER — LACTATED RINGERS IV SOLN
INTRAVENOUS | Status: DC | PRN
Start: 2019-09-28 — End: 2019-09-28

## 2019-09-28 MED ORDER — OXYCODONE HCL 5 MG PO TABS: 5.0000 mg | ORAL_TABLET | Freq: Once | ORAL | Status: DC | PRN

## 2019-09-28 MED ORDER — PHENYLEPHRINE 40 MCG/ML (10ML) SYRINGE FOR IV PUSH (FOR BLOOD PRESSURE SUPPORT)
PREFILLED_SYRINGE | INTRAVENOUS | Status: DC | PRN
  Administered 2019-09-28: 120 ug via INTRAVENOUS
  Administered 2019-09-28: 80 ug via INTRAVENOUS
  Administered 2019-09-28: 120 ug via INTRAVENOUS
  Administered 2019-09-28: 80 ug via INTRAVENOUS

## 2019-09-28 MED ORDER — ONDANSETRON HCL 4 MG/2ML IJ SOLN: 4.0000 mg | Freq: Once | INTRAMUSCULAR | Status: DC | PRN

## 2019-09-28 MED ORDER — PANTOPRAZOLE SODIUM 40 MG PO TBEC
40.0000 mg | DELAYED_RELEASE_TABLET | Freq: Every day | ORAL | Status: DC
  Administered 2019-09-28 – 2019-10-02 (×5): 40 mg via ORAL
  Filled 2019-09-28 (×5): qty 1

## 2019-09-28 MED ORDER — SODIUM CHLORIDE 0.9 % IV SOLN: INTRAVENOUS | Status: DC

## 2019-09-28 MED ORDER — FENTANYL CITRATE (PF) 250 MCG/5ML IJ SOLN
INTRAMUSCULAR | Status: AC
  Filled 2019-09-28: qty 5

## 2019-09-28 MED ORDER — METRONIDAZOLE IN NACL 5-0.79 MG/ML-% IV SOLN
500.0000 mg | Freq: Three times a day (TID) | INTRAVENOUS | Status: DC
  Administered 2019-09-28 – 2019-09-29 (×3): 500 mg via INTRAVENOUS
  Filled 2019-09-28 (×3): qty 100

## 2019-09-28 MED ORDER — 0.9 % SODIUM CHLORIDE (POUR BTL) OPTIME
TOPICAL | Status: DC | PRN
  Administered 2019-09-28: 1000 mL

## 2019-09-28 MED ORDER — DOCUSATE SODIUM 100 MG PO CAPS
100.0000 mg | ORAL_CAPSULE | Freq: Every day | ORAL | Status: DC
  Administered 2019-09-29: 100 mg via ORAL
  Filled 2019-09-28: qty 1

## 2019-09-28 MED ORDER — INSULIN ASPART 100 UNIT/ML ~~LOC~~ SOLN
0.0000 [IU] | Freq: Three times a day (TID) | SUBCUTANEOUS | Status: DC
  Administered 2019-09-28 – 2019-09-29 (×2): 15 [IU] via SUBCUTANEOUS
  Administered 2019-09-29: 5 [IU] via SUBCUTANEOUS
  Administered 2019-09-29: 3 [IU] via SUBCUTANEOUS
  Administered 2019-09-30: 15 [IU] via SUBCUTANEOUS
  Administered 2019-09-30: 3 [IU] via SUBCUTANEOUS
  Administered 2019-09-30 – 2019-10-01 (×2): 5 [IU] via SUBCUTANEOUS
  Administered 2019-10-01: 8 [IU] via SUBCUTANEOUS
  Administered 2019-10-01: 11 [IU] via SUBCUTANEOUS

## 2019-09-28 MED ORDER — MORPHINE SULFATE (PF) 2 MG/ML IV SOLN
2.0000 mg | INTRAVENOUS | Status: DC | PRN
  Administered 2019-09-28 – 2019-09-29 (×5): 2 mg via INTRAVENOUS
  Filled 2019-09-28 (×5): qty 1

## 2019-09-28 MED ORDER — HYDROMORPHONE HCL 1 MG/ML IJ SOLN: 0.2500 mg | INTRAMUSCULAR | Status: DC | PRN

## 2019-09-28 MED ORDER — BUPIVACAINE HCL (PF) 0.25 % IJ SOLN
INTRAMUSCULAR | Status: DC | PRN
  Administered 2019-09-28: 30 mL

## 2019-09-28 MED ORDER — ONDANSETRON HCL 4 MG/2ML IJ SOLN: 4.0000 mg | Freq: Four times a day (QID) | INTRAMUSCULAR | Status: DC | PRN

## 2019-09-28 MED ORDER — LIDOCAINE 2% (20 MG/ML) 5 ML SYRINGE
INTRAMUSCULAR | Status: DC | PRN
  Administered 2019-09-28: 20 mg via INTRAVENOUS

## 2019-09-28 MED ORDER — ACETAMINOPHEN 325 MG PO TABS
650.0000 mg | ORAL_TABLET | Freq: Four times a day (QID) | ORAL | Status: DC | PRN
  Administered 2019-09-30 – 2019-10-01 (×2): 650 mg via ORAL
  Filled 2019-09-28 (×2): qty 2

## 2019-09-28 MED ORDER — OXYCODONE HCL 5 MG PO TABS
5.0000 mg | ORAL_TABLET | ORAL | Status: DC | PRN
  Administered 2019-09-28 – 2019-10-02 (×7): 5 mg via ORAL
  Filled 2019-09-28 (×7): qty 1

## 2019-09-28 MED ORDER — ACETAMINOPHEN 500 MG PO TABS: 1000.0000 mg | ORAL_TABLET | Freq: Once | ORAL | Status: DC

## 2019-09-28 MED ORDER — INSULIN ASPART 100 UNIT/ML ~~LOC~~ SOLN
0.0000 [IU] | Freq: Every day | SUBCUTANEOUS | Status: DC
  Administered 2019-09-29 – 2019-09-30 (×2): 3 [IU] via SUBCUTANEOUS

## 2019-09-28 MED ORDER — OXYCODONE HCL 5 MG/5ML PO SOLN: 5.0000 mg | Freq: Once | ORAL | Status: DC | PRN

## 2019-09-28 MED ORDER — PROPOFOL 10 MG/ML IV BOLUS
INTRAVENOUS | Status: AC
  Filled 2019-09-28: qty 40

## 2019-09-28 MED ORDER — PROPOFOL 10 MG/ML IV BOLUS
INTRAVENOUS | Status: DC | PRN
  Administered 2019-09-28: 150 mg via INTRAVENOUS

## 2019-09-28 MED ORDER — DEXAMETHASONE SODIUM PHOSPHATE 10 MG/ML IJ SOLN
INTRAMUSCULAR | Status: DC | PRN
  Administered 2019-09-28: 10 mg via INTRAVENOUS

## 2019-09-28 MED ORDER — SODIUM CHLORIDE 0.9 % IV SOLN
2.0000 g | INTRAVENOUS | Status: DC
  Administered 2019-09-28: 2 g via INTRAVENOUS
  Filled 2019-09-28: qty 20

## 2019-09-28 MED ORDER — ACETAMINOPHEN 650 MG RE SUPP: 650.0000 mg | Freq: Four times a day (QID) | RECTAL | Status: DC | PRN

## 2019-09-28 MED ORDER — FENTANYL CITRATE (PF) 250 MCG/5ML IJ SOLN
INTRAMUSCULAR | Status: DC | PRN
  Administered 2019-09-28 (×2): 50 ug via INTRAVENOUS

## 2019-09-28 MED ORDER — ONDANSETRON HCL 4 MG/2ML IJ SOLN
INTRAMUSCULAR | Status: DC | PRN
  Administered 2019-09-28: 4 mg via INTRAVENOUS

## 2019-09-28 MED ORDER — PHENOL 1.4 % MT LIQD: 1.0000 | OROMUCOSAL | Status: DC | PRN

## 2019-09-28 SURGICAL SUPPLY — 57 items
BANDAGE ESMARK 6X9 LF (GAUZE/BANDAGES/DRESSINGS) IMPLANT
BLADE SAW GIGLI 510 (BLADE) ×2 IMPLANT
BLADE SAW GIGLI 510MM (BLADE) ×1
BNDG CMPR 9X6 STRL LF SNTH (GAUZE/BANDAGES/DRESSINGS)
BNDG ELASTIC 4X5.8 VLCR STR LF (GAUZE/BANDAGES/DRESSINGS) ×1 IMPLANT
BNDG ELASTIC 6X5.8 VLCR STR LF (GAUZE/BANDAGES/DRESSINGS) ×2 IMPLANT
BNDG ESMARK 6X9 LF (GAUZE/BANDAGES/DRESSINGS)
BNDG GAUZE ELAST 4 BULKY (GAUZE/BANDAGES/DRESSINGS) ×2 IMPLANT
BRUSH SCRUB EZ PLAIN DRY (MISCELLANEOUS) ×4 IMPLANT
CANISTER SUCT 3000ML PPV (MISCELLANEOUS) ×3 IMPLANT
CLIP LIGATING EXTRA MED SLVR (CLIP) ×3 IMPLANT
CLIP LIGATING EXTRA SM BLUE (MISCELLANEOUS) ×3 IMPLANT
COVER SURGICAL LIGHT HANDLE (MISCELLANEOUS) ×4 IMPLANT
COVER WAND RF STERILE (DRAPES) ×1 IMPLANT
CUFF TOURN SGL QUICK 34 (TOURNIQUET CUFF)
CUFF TOURN SGL QUICK 42 (TOURNIQUET CUFF) IMPLANT
CUFF TRNQT CYL 34X4.125X (TOURNIQUET CUFF) IMPLANT
DRAIN SNY 10X20 3/4 PERF (WOUND CARE) IMPLANT
DRAPE HALF SHEET 40X57 (DRAPES) ×3 IMPLANT
DRAPE ORTHO SPLIT 77X108 STRL (DRAPES) ×6
DRAPE SURG ORHT 6 SPLT 77X108 (DRAPES) ×2 IMPLANT
ELECT REM PT RETURN 9FT ADLT (ELECTROSURGICAL) ×3
ELECTRODE REM PT RTRN 9FT ADLT (ELECTROSURGICAL) ×1 IMPLANT
EVACUATOR SILICONE 100CC (DRAIN) IMPLANT
GAUZE SPONGE 4X4 12PLY STRL (GAUZE/BANDAGES/DRESSINGS) ×3 IMPLANT
GAUZE SPONGE 4X4 16PLY XRAY LF (GAUZE/BANDAGES/DRESSINGS) ×2 IMPLANT
GAUZE XEROFORM 5X9 LF (GAUZE/BANDAGES/DRESSINGS) ×3 IMPLANT
GLOVE BIO SURGEON STRL SZ 6.5 (GLOVE) ×2 IMPLANT
GLOVE BIO SURGEONS STRL SZ 6.5 (GLOVE) ×2
GLOVE BIOGEL PI IND STRL 6 (GLOVE) IMPLANT
GLOVE BIOGEL PI IND STRL 7.0 (GLOVE) IMPLANT
GLOVE BIOGEL PI IND STRL 7.5 (GLOVE) IMPLANT
GLOVE BIOGEL PI IND STRL 8 (GLOVE) IMPLANT
GLOVE BIOGEL PI INDICATOR 6 (GLOVE) ×2
GLOVE BIOGEL PI INDICATOR 7.0 (GLOVE) ×2
GLOVE BIOGEL PI INDICATOR 7.5 (GLOVE) ×4
GLOVE BIOGEL PI INDICATOR 8 (GLOVE) ×2
GLOVE SS BIOGEL STRL SZ 7.5 (GLOVE) ×1 IMPLANT
GLOVE SUPERSENSE BIOGEL SZ 7.5 (GLOVE) ×2
GOWN STRL REUS W/ TWL LRG LVL3 (GOWN DISPOSABLE) ×3 IMPLANT
GOWN STRL REUS W/TWL LRG LVL3 (GOWN DISPOSABLE) ×6
KIT BASIN OR (CUSTOM PROCEDURE TRAY) ×3 IMPLANT
KIT TURNOVER KIT B (KITS) ×3 IMPLANT
NS IRRIG 1000ML POUR BTL (IV SOLUTION) ×3 IMPLANT
PACK GENERAL/GYN (CUSTOM PROCEDURE TRAY) ×3 IMPLANT
PAD ARMBOARD 7.5X6 YLW CONV (MISCELLANEOUS) ×6 IMPLANT
PADDING CAST COTTON 6X4 STRL (CAST SUPPLIES) IMPLANT
STAPLER VISISTAT 35W (STAPLE) ×3 IMPLANT
STOCKINETTE IMPERVIOUS LG (DRAPES) ×5 IMPLANT
SUT ETHILON 3 0 PS 1 (SUTURE) ×2 IMPLANT
SUT VIC AB 0 CT1 18XCR BRD 8 (SUTURE) ×2 IMPLANT
SUT VIC AB 0 CT1 8-18 (SUTURE) ×6
SUT VICRYL AB 2 0 TIES (SUTURE) ×3 IMPLANT
SYR TOOMEY 50ML (SYRINGE) ×2 IMPLANT
TOWEL GREEN STERILE (TOWEL DISPOSABLE) ×4 IMPLANT
UNDERPAD 30X36 HEAVY ABSORB (UNDERPADS AND DIAPERS) ×5 IMPLANT
WATER STERILE IRR 1000ML POUR (IV SOLUTION) ×3 IMPLANT

## 2019-09-28 NOTE — Progress Notes (Signed)
Current Palliative:  Manufacturing engineer (ACC) Community Based Palliative Care        This patient is enrolled in our palliative care services in the community.  ACC will continue to follow for any discharge planning needs and to coordinate continuation of palliative care.   If you have questions or need assistance, please call [phone] or contact the hospital Liaison listed on AMION.        Domenic Moras, BSN, RN [facility] Liaison   [phone] (24h on call)

## 2019-09-28 NOTE — Interval H&P Note (Signed)
History and Physical Interval Note:  09/28/2019 7:24 AM  [name]  has presented today for surgery, with the diagnosis of RIGHT FOOT OSTEOMYELITIS.  The various methods of treatment have been discussed with the patient and family. After consideration of risks, benefits and other options for treatment, the patient has consented to  Procedure(s): AMPUTATION BELOW KNEE (Right) as a surgical intervention.  The patient's history has been reviewed, patient examined, no change in status, stable for surgery.  I have reviewed the patient's chart and labs.  Questions were answered to the patient's satisfaction.     Curt Jews

## 2019-09-28 NOTE — Anesthesia Procedure Notes (Signed)
Procedure Name: LMA Insertion Date/Time: 09/28/2019 7:53 AM Performed by: Darletta Moll, CRNA Pre-anesthesia Checklist: Patient identified, Emergency Drugs available, Suction available and Patient being monitored Patient Re-evaluated:Patient Re-evaluated prior to induction Oxygen Delivery Method: Circle system utilized Preoxygenation: Pre-oxygenation with 100% oxygen Induction Type: IV induction Ventilation: Mask ventilation without difficulty LMA: LMA inserted LMA Size: 5.0 Number of attempts: 1 Placement Confirmation: positive ETCO2,  breath sounds checked- equal and bilateral and CO2 detector Tube secured with: Tape Dental Injury: Teeth and Oropharynx as per pre-operative assessment

## 2019-09-28 NOTE — Progress Notes (Signed)
Pharmacy Antibiotic Note  Anthony Dunlap is a 86 y.o. male admitted on 09/25/2019 with diabetic foot infection./psteomyelits or R foot  Pharmacy has been consulted for Vancomycin dosing.  Also on IV Ceftriaxone and Metronidazole.    On chronic suppressive antibiotics PTA for hx pelvic/R hip osteomyelitis..   s/p R BKA today.  Renal function stable.  Plan:  Continue Vancomycin 1gm IV q24hrs.  Target troughs 15-20 mcg/ml  Will plan to check vanc trough level at steady state if >5 - 7 day course.  Also on Ceftriaxone 2 gm IV q24hrs and Metronidazole 500 mg IV q8h.  Will follow renal function, culture data, clinical progress and IV abx plans.  Noted plan to resume suppressive oral antibiotics at discharge.  Height: 5\' 8"  (172.7 cm) Weight: 78 kg (172 lb) IBW/kg (Calculated) : 68.4  Temp (24hrs), Avg:98.7 F (37.1 C), Min:98.1 F (36.7 C), Max:99.9 F (37.7 C)  Recent Labs  Lab 09/25/19 1549 09/25/19 1957 09/26/19 0118 09/27/19 0231 09/28/19 0240  WBC 10.9*  --  8.7 7.7 8.4  CREATININE 1.15 1.20 1.18 0.95 0.98    Estimated Creatinine Clearance: 54.3 mL/min (by C-G formula based on SCr of 0.98 mg/dL).    Allergies  Allergen Reactions  . Fluconazole Other (See Comments)    Drug-induced hepatitis    Antimicrobials this admission:  Vancomycin 8/7>>  Ceftriaxone 8/6>>  Metronidazole 8/6 >>  Dose adjustments this admission:  n/a  Microbiology results:  8/7 MRSA PCR: negative  8/6 COVID: negative  Thank you for allowing pharmacy to be a part of this patient's care.  Arty Baumgartner, [place] Phone: [phone] 09/28/2019 3:27 PM

## 2019-09-28 NOTE — Plan of Care (Signed)

## 2019-09-28 NOTE — Anesthesia Procedure Notes (Signed)
Anesthesia Regional Block: Adductor canal block   Pre-Anesthetic Checklist: ,, timeout performed, Correct Patient, Correct Site, Correct Laterality, Correct Procedure, Correct Position, site marked, Risks and benefits discussed,  Surgical consent,  Pre-op evaluation,  At surgeon's request and post-op pain management  Laterality: Right  Prep: Maximum Sterile Barrier Precautions used, chloraprep       Needles:  Injection technique: Single-shot  Needle Type: Echogenic Stimulator Needle     Needle Length: 9cm  Needle Gauge: 22     Additional Needles:   Procedures:,,,, ultrasound used (permanent image in chart),,,,  Narrative:  Start time: 09/28/2019 6:50 AM End time: 09/28/2019 6:55 AM Injection made incrementally with aspirations every 5 mL.  Performed by: Personally  Anesthesiologist: Pervis Hocking, DO  Additional Notes: Monitors applied. No increased pain on injection. No increased resistance to injection. Injection made in 5cc increments. Good needle visualization. Patient tolerated procedure well.

## 2019-09-28 NOTE — Progress Notes (Signed)
Pt arrived from PACU, alert/oreitned in no acute distress. Situated/re-orientated to room/equipments. No complaints .Surigical site to right BKA with compression wrap CDI.

## 2019-09-28 NOTE — Addendum Note (Signed)
Addendum  created 09/28/19 1131 by Rella Egelston, Ivar Drape, CRNA   Charge Capture section accepted

## 2019-09-28 NOTE — Progress Notes (Signed)
PROGRESS NOTE    ENGELBERT SEVIN  CNO:[id number] DOB: May 28, 1933 DOA: 09/25/2019 PCP: [name], MD  Brief Narrative: Anthony Dunlap  is a 86 y.o. male, past medical history of diabetes mellitus, prostate cancer, aortic angioplasty, peripheral vascular disease following with [name], history of pelvic/right hip osteomyelitis on chronic antibiotic suppressive therapy for him by [name], Patient presented to ED secondary to bleeding from nonhealing heel ulcers which are chronic due to severe peripheral vascular disease, followed by vascular surgery previously underwent angiogram and did not have revascularization options on the right, BKA was recommended at the time but patient does not ready to proceed. - in ED initial x-ray of bilateral feet, concerning for osteomyelitis of right heel, so MRI right foot was obtained which confirmed osteomyelitis, no leukocytosis, no fever, patient was started empirically on Rocephin, Flagyl and vancomycin -VVS consulted, now BKA being planned   Assessment & Plan:   Acute osteomyelitis of right foot Severe peripheral vascular disease -MRI confirms osteomyelitis of the calcaneus -no revascularization options on the right based on prior angiogram, underwent recent left anterior tibial artery angioplasty and unsuccessful antegrade recanalization of peroneal artery -Vascular surgery consulted and following -Right BKA today -Continue aspirin, Plavix and DVT prophylaxis on hold for surgery -Will need PT eval postop  Diabetes mellitus -Oral hypoglycemics on hold  -Continue Lantus at lower dose, increased back up tomorrow -Hemoglobin A1c is 8.1  Left heel ulcer -No evidence of osteomyelitis of left foot on x-ray -Status post balloon angioplasty recently of left lower extremity, follow-up with vascular surgery  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 -Followed by infectious disease [name] as outpatient on  suppressive chronic antibiotic therapy including Omnicef, Augmentin and doxycycline in the past -Resume home antibiotic regimen at discharge  Chronic normocytic anemia -Hemoglobin slightly lower, monitor  DVT prophylaxis: Heparin subcutaneous-on hold for OR Code Status: Full code Family Communication: No family at bedside Disposition Plan:  Status is: Inpatient  Remains inpatient appropriate because:Inpatient level of care appropriate due to severity of illness  Dispo: The patient is from: Home              Anticipated d/c is to: Home with home health              Anticipated d/c date is: Likely 1 to 2 days              Patient currently is not medically stable to d/c.   Consultants:  Vascular surgery  Procedures:   Subjective: -No issues overnight, about to be taken down for BKA  Objective: Vitals:   09/28/19 0920 09/28/19 0935 09/28/19 1005 09/28/19 1401  BP: 122/75 114/68 96/83 138/81  Pulse: 91 94 97 (!) 108  Resp: 13 15 15 20   Temp: 98.6 F (37 C) 98.3 F (36.8 C) 99 F (37.2 C) 98.3 F (36.8 C)  TempSrc:   Oral Oral  SpO2: 99% 99% 95% 97%  Weight:      Height:        Intake/Output Summary (Last 24 hours) at 09/28/2019 1413 Last data filed at 09/28/2019 0935 Gross per 24 hour  Intake 1563.05 ml  Output 2550 ml  Net -986.95 ml   Filed Weights   09/25/19 1037  Weight: 78 kg    Examination:  General exam: Chronically ill elderly male laying in bed, AAOx3, no distress HEENT: No JVD CVS: S1-S2, regular rate rhythm Lungs: Clear bilaterally Abdomen: Soft, obese, nontender, bowel sounds present  Extremities: No  edema, deep bilateral heel ulcers with dressing  Skin : Extensive deep heel ulcers with dressing on the R foot Psychiatry:  Mood & affect appropriate.   Data Reviewed:   CBC: Recent Labs  Lab 09/25/19 1549 09/26/19 0118 09/27/19 0231 09/28/19 0240  WBC 10.9* 8.7 7.7 8.4  NEUTROABS 9.0*  --   --   --   HGB 10.1* 8.9* 9.1* 8.8*  HCT  32.9* 28.4* 29.6* 28.6*  MCV 86.4 83.8 84.3 84.9  PLT 431* 357 382 093   Basic Metabolic Panel: Recent Labs  Lab 09/25/19 1549 09/25/19 1957 09/26/19 0118 09/27/19 0231 09/28/19 0240  NA 136  --  138 137 138  K 4.6  --  3.8 4.2 3.7  CL 102  --  103 103 104  CO2 23  --  22 24 23   GLUCOSE 146*  --  122* 105* 67*  BUN 30*  --  28* 28* 27*  CREATININE 1.15 1.20 1.18 0.95 0.98  CALCIUM 9.0  --  8.7* 9.0 8.9   GFR: Estimated Creatinine Clearance: 54.3 mL/min (by C-G formula based on SCr of 0.98 mg/dL). Liver Function Tests: No results for input(s): AST, ALT, ALKPHOS, BILITOT, PROT, ALBUMIN in the last 168 hours. No results for input(s): LIPASE, AMYLASE in the last 168 hours. No results for input(s): AMMONIA in the last 168 hours. Coagulation Profile: Recent Labs  Lab 09/28/19 0240  INR 1.1   Cardiac Enzymes: No results for input(s): CKTOTAL, CKMB, CKMBINDEX, TROPONINI in the last 168 hours. BNP (last 3 results) No results for input(s): PROBNP in the last 8760 hours. HbA1C: Recent Labs    09/25/19 1554  HGBA1C 8.1*   CBG: Recent Labs  Lab 09/27/19 1633 09/27/19 2109 09/28/19 0511 09/28/19 0759 09/28/19 0919  GLUCAP 272* 132* 81 116* 104*   Lipid Profile: No results for input(s): CHOL, HDL, LDLCALC, TRIG, CHOLHDL, LDLDIRECT in the last 72 hours. Thyroid Function Tests: No results for input(s): TSH, T4TOTAL, FREET4, T3FREE, THYROIDAB in the last 72 hours. Anemia Panel: No results for input(s): VITAMINB12, FOLATE, FERRITIN, TIBC, IRON, RETICCTPCT in the last 72 hours. Urine analysis:    Component Value Date/Time   COLORURINE YELLOW 06/12/2019 0822   APPEARANCEUR CLEAR 06/12/2019 0822   LABSPEC 1.008 06/12/2019 0822   PHURINE 7.0 06/12/2019 0822   GLUCOSEU NEGATIVE 06/12/2019 0822   HGBUR NEGATIVE 06/12/2019 0822   BILIRUBINUR NEGATIVE 06/12/2019 0822   KETONESUR NEGATIVE 06/12/2019 0822   PROTEINUR NEGATIVE 06/12/2019 0822   UROBILINOGEN 0.2 09/03/2013  0132   NITRITE POSITIVE (A) 06/12/2019 0822   LEUKOCYTESUR LARGE (A) 06/12/2019 0822   Sepsis Labs: @LABRCNTIP (procalcitonin:4,lacticidven:4)  ) Recent Results (from the past 240 hour(s))  SARS Coronavirus 2 by RT PCR (hospital order, performed in [facility] hospital lab) Nasopharyngeal Nasopharyngeal Swab     Status: None   Collection Time: 09/25/19  6:20 PM   Specimen: Nasopharyngeal Swab  Result Value Ref Range Status   SARS Coronavirus 2 NEGATIVE NEGATIVE Final    Comment: (NOTE) SARS-CoV-2 target nucleic acids are NOT DETECTED.  The SARS-CoV-2 RNA is generally detectable in upper and lower respiratory specimens during the acute phase of infection. The lowest concentration of SARS-CoV-2 viral copies this assay can detect is 250 copies / mL. A negative result does not preclude SARS-CoV-2 infection and should not be used as the sole basis for treatment or other patient management decisions.  A negative result may occur with improper specimen collection / handling, submission of specimen other than nasopharyngeal  swab, presence of viral mutation(s) within the areas targeted by this assay, and inadequate number of viral copies (<250 copies / mL). A negative result must be combined with clinical observations, patient history, and epidemiological information.  Fact Sheet for Patients:   StrictlyIdeas.no  Fact Sheet for Healthcare Providers: BankingDealers.co.za  This test is not yet approved or  cleared by the [place] FDA and has been authorized for detection and/or diagnosis of SARS-CoV-2 by FDA under an Emergency Use Authorization (EUA).  This EUA will remain in effect (meaning this test can be used) for the duration of the COVID-19 declaration under Section 564(b)(1) of the Act, 21 U.S.C. section 360bbb-3(b)(1), unless the authorization is terminated or revoked sooner.  Performed at [facility], [address],  [place], [place] [address]   Surgical pcr screen     Status: None   Collection Time: 09/26/19  4:22 AM   Specimen: Nasal Mucosa; Nasal Swab  Result Value Ref Range Status   MRSA, PCR NEGATIVE NEGATIVE Final   Staphylococcus aureus NEGATIVE NEGATIVE Final    Comment: (NOTE) The Xpert SA Assay (FDA approved for NASAL specimens in patients 24 years of age and older), is one component of a comprehensive surveillance program. It is not intended to diagnose infection nor to guide or monitor treatment. Performed at [facility], [place] [address], [place], [place] [address]          Radiology Studies: No results found.      Scheduled Meds:  (feeding supplement) PROSource Plus  30 mL Oral BID WC   amitriptyline  10 mg Oral QHS   aspirin EC  81 mg Oral Daily   [START ON 09/29/2019] docusate sodium  100 mg Oral Daily   ferrous sulfate  325 mg Oral Q breakfast   insulin aspart  0-15 Units Subcutaneous TID WC   insulin aspart  0-5 Units Subcutaneous QHS   insulin glargine  14 Units Subcutaneous Daily   levothyroxine  100 mcg Oral q morning - 10a   multivitamin with minerals  1 tablet Oral Daily   nutrition supplement (JUVEN)  1 packet Oral BID BM   pantoprazole  40 mg Oral Daily   pravastatin  10 mg Oral q1800   primidone  150 mg Oral QHS   Continuous Infusions:  sodium chloride 75 mL/hr at 09/28/19 1131   vancomycin 1,000 mg (09/27/19 0900)     LOS: 3 days    Time spent: 23min  Domenic Polite, MD [facility]  09/28/2019, 2:13 PM

## 2019-09-28 NOTE — Anesthesia Preprocedure Evaluation (Addendum)
Anesthesia Evaluation  Patient identified by MRN, date of birth, ID band Patient awake    Reviewed: Allergy & Precautions, NPO status , Patient's Chart, lab work & pertinent test results  Airway Mallampati: II  TM Distance: >3 FB Neck ROM: Full    Dental  (+) Poor Dentition, Dental Advisory Given   Pulmonary neg pulmonary ROS,    Pulmonary exam normal breath sounds clear to auscultation       Cardiovascular + Peripheral Vascular Disease (plavix ) and +CHF (grade 1 diastolic dysfx)  Normal cardiovascular exam Rhythm:Regular Rate:Normal  Echo 2019: - Left ventricle: The cavity size was normal. Wall thickness was  increased in a pattern of mild LVH. Systolic function was normal.  The estimated ejection fraction was in the range of 60% to 65%.  Wall motion was normal; there were no regional wall motion  abnormalities. Doppler parameters are consistent with abnormal  left ventricular relaxation (grade 1 diastolic dysfunction).  - Aortic valve: Mildly calcified annulus. Trileaflet; mildly  calcified leaflets.  - Mitral valve: Mildly calcified annulus. There was trivial  regurgitation.  - Right atrium: Central venous pressure (est): 3 mm Hg.  - Tricuspid valve: Nodular thickening. There was mild  regurgitation.  - Pulmonary arteries: PA peak pressure: 37 mm Hg (S).  - Pericardium, extracardiac: There was no pericardial effusion.    Neuro/Psych negative neurological ROS  negative psych ROS   GI/Hepatic negative GI ROS, Neg liver ROS,   Endo/Other  diabetes, Poorly Controlled, Type 2, Insulin Dependent, Oral Hypoglycemic AgentsHypothyroidism Last a1c 8.1 FS 81 this AM  Renal/GU negative Renal ROS  negative genitourinary   Musculoskeletal  (+) Arthritis , Osteoarthritis,  R foot osteo   Abdominal Normal abdominal exam  (+)   Peds  Hematology  (+) Blood dyscrasia, anemia , hct 28.6   Anesthesia Other  Findings   Reproductive/Obstetrics negative OB ROS                            Anesthesia Physical Anesthesia Plan  ASA: III  Anesthesia Plan: General and Regional   Post-op Pain Management: GA combined w/ Regional for post-op pain   Induction: Intravenous  PONV Risk Score and Plan: 2 and Ondansetron, Dexamethasone and Treatment may vary due to age or medical condition  Airway Management Planned: LMA  Additional Equipment: None  Intra-op Plan:   Post-operative Plan: Extubation in OR  Informed Consent: I have reviewed the patients History and Physical, chart, labs and discussed the procedure including the risks, benefits and alternatives for the proposed anesthesia with the patient or authorized representative who has indicated his/her understanding and acceptance.     Dental advisory given  Plan Discussed with: CRNA  Anesthesia Plan Comments:         Anesthesia Quick Evaluation

## 2019-09-28 NOTE — Anesthesia Procedure Notes (Signed)
Anesthesia Regional Block: Popliteal block   Pre-Anesthetic Checklist: ,, timeout performed, Correct Patient, Correct Site, Correct Laterality, Correct Procedure, Correct Position, site marked, Risks and benefits discussed,  Surgical consent,  Pre-op evaluation,  At surgeon's request and post-op pain management  Laterality: Right  Prep: Maximum Sterile Barrier Precautions used, chloraprep       Needles:  Injection technique: Single-shot  Needle Type: Echogenic Stimulator Needle     Needle Length: 9cm  Needle Gauge: 22     Additional Needles:   Procedures:,,,, ultrasound used (permanent image in chart),,,,  Narrative:  Start time: 09/28/2019 6:55 AM End time: 09/28/2019 7:00 AM Injection made incrementally with aspirations every 5 mL.  Performed by: Personally  Anesthesiologist: Pervis Hocking, DO  Additional Notes: Monitors applied. No increased pain on injection. No increased resistance to injection. Injection made in 5cc increments. Good needle visualization. Patient tolerated procedure well.

## 2019-09-28 NOTE — Progress Notes (Signed)
OT Cancellation    09/28/19 0700  OT Visit Information  Last OT Received On 09/28/19  Reason Eval/Treat Not Completed Patient at procedure or test/ unavailable (sx)  Maurie Boettcher, OT/L   Acute OT Clinical Specialist [place] Pager [phone] Office [phone]

## 2019-09-28 NOTE — Transfer of Care (Signed)
Immediate Anesthesia Transfer of Care Note  Patient: Anthony Dunlap  Procedure(s) Performed: AMPUTATION BELOW KNEE (Right Knee)  Patient Location: PACU  Anesthesia Type:General and GA combined with regional for post-op pain  Level of Consciousness: drowsy and patient cooperative  Airway & Oxygen Therapy: Patient Spontanous Breathing  Post-op Assessment: Report given to RN and Post -op Vital signs reviewed and stable  Post vital signs: Reviewed and stable  Last Vitals:  Vitals Value Taken Time  BP 122/75 09/28/19 0919  Temp    Pulse 92 09/28/19 0920  Resp 18 09/28/19 0920  SpO2 99 % 09/28/19 0920  Vitals shown include unvalidated device data.  Last Pain:  Vitals:   09/28/19 0417  TempSrc: Oral  PainSc:       Patients Stated Pain Goal: 2 (99/80/69 9967)  Complications: No complications documented.

## 2019-09-28 NOTE — Anesthesia Postprocedure Evaluation (Signed)
Anesthesia Post Note  Patient: Anthony Dunlap  Procedure(s) Performed: AMPUTATION BELOW KNEE (Right Knee)     Patient location during evaluation: PACU Anesthesia Type: Regional and General Level of consciousness: awake and alert, oriented and patient cooperative Pain management: pain level controlled Vital Signs Assessment: post-procedure vital signs reviewed and stable Respiratory status: spontaneous breathing, nonlabored ventilation and respiratory function stable Cardiovascular status: blood pressure returned to baseline and stable Postop Assessment: no apparent nausea or vomiting Anesthetic complications: no   No complications documented.  Last Vitals:  Vitals:   09/28/19 0935 09/28/19 1005  BP: 114/68 96/83  Pulse: 94 97  Resp: 15 15  Temp: 36.8 C 37.2 C  SpO2: 99% 95%    Last Pain:  Vitals:   09/28/19 1005  TempSrc: Oral  PainSc:                  Pervis Hocking

## 2019-09-28 NOTE — Consult Note (Signed)
PV Navigator consult acknowledged and chart reviewed.  86 y/o male admitted with chronic, non healing and worsening ulcers to bilateral heels. R heel bleeding and worse than L. Osteomyelitis R heel confirmed by MRI. POC discussed with patient and he opted for R BKA with vascular performed today.  PMH to include DM-2, Prostate Ca, Chronic osteomyelitis pelvis/ R thigh (followed by Infection Disease) Balloon angioplasty LLE 08/19/19 with no revascularization options for RLE.   Consulted disciplines in place: Nutrition, Diabetes Coordinator, PT/OT and IP rehab (pending PT/OT post op recommendations)  Patient will need to continue to follow up with [place] post discharge for care L heel ulcer  Will follow.  No other barriers identified at this time but that may change pending discharge disposition.   Thank you, Cletis Media RN BSN CWS [place]

## 2019-09-28 NOTE — Progress Notes (Signed)
Inpatient Rehabilitation Admissions Coordinator  I await postoperative therapy reassessments to assist with planning most appropriate rehab venue option.  Danne Baxter, RN, MSN Rehab Admissions Coordinator [phone] 09/28/2019 2:33 PM

## 2019-09-28 NOTE — Op Note (Signed)
    OPERATIVE REPORT  DATE OF SURGERY: 09/28/2019  PATIENT: Anthony Dunlap, 86 y.o. male MRN: [id number]  DOB: 09-18-1933  PRE-OPERATIVE DIAGNOSIS: Gangrene right foot  POST-OPERATIVE DIAGNOSIS:  Same  PROCEDURE: Right below-knee amputation  SURGEON:  Curt Jews, M.D.  PHYSICIAN ASSISTANT: Rhyne PAC  The assistant was needed for exposure and to expedite the case  ANESTHESIA: General  EBL: per anesthesia record  Total I/O In: 500 [I.V.:500] Out: 100 [Blood:100]  BLOOD ADMINISTERED: none  DRAINS: none  SPECIMEN: none  COUNTS CORRECT:  YES  PATIENT DISPOSITION:  PACU - hemodynamically stable  PROCEDURE DETAILS: Patient was taken up and placed to position with area of the right leg prepped draped you sterile fashion.  Incision was made several fingerbreadths below the tibial prominence and carried down through the anterior tibial muscle bodies.  The anterior tibial neurovascular bundle was ligated and divided.  The posterior flap was left several centimeters longer for muscle flap over the bones.  The gastrocnemius and soleus muscle were divided in line with the posterior skin incision.  The popliteal artery was ligated and divided.  Periosteum was elevated off the tibia and fibula.  The fibula was divided with bone shears and the tibia was divided with a Gigli saw.  The bone edges were smoothed with a bone rasp.  The specimen was passed off the field.  The wounds irrigated with saline and hemostasis electrocautery.  The wounds were closed with 0 Vicryl figure-of-eight sutures to close the anterior fascia to the posterior fascia.  Wound was again irrigated and skin was closed with skin staples.  Sterile dressing was applied and the patient was transferred to the recovery room in stable condition   Rosetta Posner, M.D., [facility] 09/28/2019 9:29 AM

## 2019-09-28 NOTE — Progress Notes (Signed)
Inpatient Diabetes Program Recommendations  AACE/ADA: New Consensus Statement on Inpatient Glycemic Control (2015)  Target Ranges:  Prepandial:   less than 140 mg/dL      Peak postprandial:   less than 180 mg/dL (1-2 hours)      Critically ill patients:  140 - 180 mg/dL   Lab Results  Component Value Date   GLUCAP 104 (H) 09/28/2019   HGBA1C 8.1 (H) 09/25/2019    Review of Glycemic Control  Current orders for Inpatient glycemic control:  Lantus 14 units Novolog 0-15 units tid + hs  Inpatient Diabetes Program Recommendations:    Pt just received decadron 10 mg during surgery.  - Consider increasing Lantus back up to 20 units - Consider Novolog Correction Q4 hours to cover hyperglycemia from steroids.  Will need to titrate back down in 48 hours.  Thanks, Tama Headings RN, MSN, BC-ADM Inpatient Diabetes Coordinator Team Pager [phone] (8a-5p)

## 2019-09-29 ENCOUNTER — Encounter (HOSPITAL_COMMUNITY): Payer: Self-pay | Admitting: Vascular Surgery

## 2019-09-29 LAB — BASIC METABOLIC PANEL
Anion gap: 10 (ref 5–15)
BUN: 27 mg/dL — ABNORMAL HIGH (ref 8–23)
CO2: 23 mmol/L (ref 22–32)
Calcium: 8.6 mg/dL — ABNORMAL LOW (ref 8.9–10.3)
Chloride: 102 mmol/L (ref 98–111)
Creatinine, Ser: 1.12 mg/dL (ref 0.61–1.24)
GFR calc Af Amer: >60 mL/min (ref >60)
GFR calc non Af Amer: 60 mL/min — ABNORMAL LOW (ref >60)
Glucose, Bld: 149 mg/dL — ABNORMAL HIGH (ref 70–99)
Potassium: 4.1 mmol/L (ref 3.5–5.1)
Sodium: 135 mmol/L (ref 135–145)

## 2019-09-29 LAB — RETICULOCYTES
Immature Retic Fract: 10.7 % (ref 2.3–15.9)
RBC.: 2.98 MIL/uL — ABNORMAL LOW (ref 4.22–5.81)
Retic Count, Absolute: 43.5 10*3/uL (ref 19.0–186.0)
Retic Ct Pct: 1.5 % (ref 0.4–3.1)

## 2019-09-29 LAB — CBC
HCT: 23.4 % — ABNORMAL LOW (ref 39.0–52.0)
Hemoglobin: 7.1 g/dL — ABNORMAL LOW (ref 13.0–17.0)
MCH: 25.7 pg — ABNORMAL LOW (ref 26.0–34.0)
MCHC: 30.3 g/dL (ref 30.0–36.0)
MCV: 84.8 fL (ref 80.0–100.0)
Platelets: 346 10*3/uL (ref 150–400)
RBC: 2.76 MIL/uL — ABNORMAL LOW (ref 4.22–5.81)
RDW: 13.8 % (ref 11.5–15.5)
WBC: 12.1 10*3/uL — ABNORMAL HIGH (ref 4.0–10.5)
nRBC: 0 % (ref 0.0–0.2)

## 2019-09-29 LAB — IRON AND TIBC
Iron: 13 ug/dL — ABNORMAL LOW (ref 45–182)
Saturation Ratios: 6 % — ABNORMAL LOW (ref 17.9–39.5)
TIBC: 207 ug/dL — ABNORMAL LOW (ref 250–450)
UIBC: 194 ug/dL

## 2019-09-29 LAB — FOLATE: Folate: 14.9 ng/mL (ref >5.9)

## 2019-09-29 LAB — GLUCOSE, CAPILLARY
Glucose-Capillary: 157 mg/dL — ABNORMAL HIGH (ref 70–99)
Glucose-Capillary: 248 mg/dL — ABNORMAL HIGH (ref 70–99)
Glucose-Capillary: 384 mg/dL — ABNORMAL HIGH (ref 70–99)
Glucose-Capillary: 295 mg/dL — ABNORMAL HIGH (ref 70–99)

## 2019-09-29 LAB — FERRITIN: Ferritin: 114 ng/mL (ref 24–336)

## 2019-09-29 LAB — VITAMIN B12: Vitamin B-12: 196 pg/mL (ref 180–914)

## 2019-09-29 MED ORDER — INSULIN ASPART 100 UNIT/ML ~~LOC~~ SOLN
3.0000 [IU] | Freq: Three times a day (TID) | SUBCUTANEOUS | Status: DC
  Administered 2019-09-29 – 2019-10-02 (×8): 3 [IU] via SUBCUTANEOUS

## 2019-09-29 MED ORDER — DOXYCYCLINE HYCLATE 100 MG PO TABS
100.0000 mg | ORAL_TABLET | Freq: Two times a day (BID) | ORAL | Status: DC
  Administered 2019-09-29 – 2019-10-02 (×7): 100 mg via ORAL
  Filled 2019-09-29 (×7): qty 1

## 2019-09-29 MED ORDER — POLYETHYLENE GLYCOL 3350 17 G PO PACK
17.0000 g | PACK | Freq: Every day | ORAL | Status: DC
  Administered 2019-09-29 – 2019-10-01 (×3): 17 g via ORAL
  Filled 2019-09-29 (×3): qty 1

## 2019-09-29 MED ORDER — SENNOSIDES-DOCUSATE SODIUM 8.6-50 MG PO TABS
1.0000 | ORAL_TABLET | Freq: Two times a day (BID) | ORAL | Status: DC
  Administered 2019-09-29 – 2019-10-02 (×7): 1 via ORAL
  Filled 2019-09-29 (×7): qty 1

## 2019-09-29 MED ORDER — SILVER SULFADIAZINE 1 % EX CREA
TOPICAL_CREAM | Freq: Two times a day (BID) | CUTANEOUS | Status: DC
  Filled 2019-09-29: qty 85

## 2019-09-29 MED ORDER — INSULIN GLARGINE 100 UNIT/ML ~~LOC~~ SOLN
20.0000 [IU] | Freq: Every day | SUBCUTANEOUS | Status: DC
  Administered 2019-09-30 – 2019-10-01 (×2): 20 [IU] via SUBCUTANEOUS
  Filled 2019-09-29 (×2): qty 0.2

## 2019-09-29 NOTE — Evaluation (Signed)
Occupational Therapy Re-Evaluation Patient Details Name: Anthony Dunlap MRN: [id number] DOB: 1933/07/09 Today's Date: 09/29/2019    History of Present Illness Pt is a 86 yr old male who presented due to bleeding and nonhealing heel ulcers. S/p right below knee amputation 09/28/2019. PMH but not limited to  pelvic/right hip osteomyelitis, prostate ca, hernia, R total hip arthroplasty    Clinical Impression   Pt with significant weakness requiring +2 assist and use of stedy for OOB to chair. Demonstrates poor sitting and standing balance. Pt requires set up to total assist for ADL. Pt with impaired memory, slow processing and decreased safety awareness. Will likely require and extended period of rehab upon discharge, recommending SNF. Will follow acutely.    Follow Up Recommendations  SNF;Supervision/Assistance - 24 hour    Equipment Recommendations   (defer to next venue)    Recommendations for Other Services       Precautions / Restrictions Precautions Precautions: Fall Restrictions Weight Bearing Restrictions: Yes RLE Weight Bearing: Non weight bearing      Mobility Bed Mobility Overal bed mobility: Needs Assistance Bed Mobility: Supine to Sit     Supine to sit: Mod assist;+2 for safety/equipment     General bed mobility comments: ModA (+2 safety) for progression to edge of bed, assist for trunk elevation. Cues to not weightbear through R residual limb with transition  Transfers Overall transfer level: Needs assistance Equipment used: Rolling walker (2 wheeled);Ambulation equipment used Transfers: Sit to/from Stand Sit to Stand: Mod assist;+2 physical assistance         General transfer comment: ModA + 2 to rise from elevated edge of bed to walker, cues for placing left foot posteriorly and upright positioning. Pt with crouched posture so returned to seated position. Utilized Stedy to transfer over from bed to chair    Balance Overall balance assessment: Needs  assistance Sitting-balance support: Feet unsupported;Bilateral upper extremity supported Sitting balance-Leahy Scale: Poor Sitting balance - Comments: Requiring up to modA, progressing to min guard towards end but still relying on BUE support. Cues for anterior weight shift Postural control: Posterior lean Standing balance support: Bilateral upper extremity supported Standing balance-Leahy Scale: Poor Standing balance comment: needing B UE and external support of 2                           ADL either performed or assessed with clinical judgement   ADL   Eating/Feeding: Independent;Bed level Eating/Feeding Details (indicate cue type and reason): increased spillage Grooming: Wash/dry hands;Wash/dry face;Brushing hair;Sitting;Minimal assistance Grooming Details (indicate cue type and reason): decreased thoroughness Upper Body Bathing: Moderate assistance;Sitting   Lower Body Bathing: Total assistance;Sitting/lateral leans;Bed level   Upper Body Dressing : Moderate assistance;Sitting   Lower Body Dressing: Total assistance;Sitting/lateral leans   Toilet Transfer: +2 for physical assistance;Total assistance   Toileting- Clothing Manipulation and Hygiene: Total assistance;+2 for physical assistance;Sit to/from stand         General ADL Comments: pt with poor sitting balance and posterior lean, reliant on UEs and intermittent moderate assistance to regain balance     Vision Patient Visual Report: No change from baseline       Perception     Praxis      Pertinent Vitals/Pain Pain Assessment: Faces Faces Pain Scale: No hurt     Hand Dominance Right   Extremity/Trunk Assessment Upper Extremity Assessment Upper Extremity Assessment: Generalized weakness   Lower Extremity Assessment Lower Extremity Assessment: RLE deficits/detail;LLE deficits/detail  RLE Deficits / Details: Knee extension limited 10 degrees from neutral, able to perform LAQ LLE Deficits /  Details: At least anti gravity strength   Cervical / Trunk Assessment Cervical / Trunk Assessment: Kyphotic   Communication Communication Communication: No difficulties   Cognition Arousal/Alertness: Awake/alert Behavior During Therapy: WFL for tasks assessed/performed Overall Cognitive Status: Impaired/Different from baseline Area of Impairment: Orientation;Attention;Following commands;Safety/judgement;Awareness;Problem solving                 Orientation Level: Disoriented to;Time Current Attention Level: Sustained   Following Commands: Follows one step commands inconsistently Safety/Judgement: Decreased awareness of safety;Decreased awareness of deficits Awareness: Intellectual Problem Solving: Requires verbal cues;Difficulty sequencing;Slow processing General Comments: pt needing multimodal cues for all direction following, able to remember day of week and month by end of session, suspect pt will be slow to progress   General Comments       Exercises    Shoulder Instructions      Home Living Family/patient expects to be discharged to:: Private residence Living Arrangements: Spouse/significant other;Other relatives Available Help at Discharge: Family;Available PRN/intermittently Type of Home: House Home Access: Ramped entrance     Home Layout: One level     Bathroom Shower/Tub: Occupational psychologist: Standard Bathroom Accessibility: Yes   Home Equipment: Bedside commode;Shower seat;Walker - 2 wheels;Cane - single point;Walker - 4 wheels;Grab bars - tub/shower   Additional Comments: Wife has dementia, pt has an aide who comes in 3x a week       Prior Functioning/Environment Level of Independence: Independent with assistive device(s)        Comments: Uses RW        OT Problem List: Decreased strength;Decreased range of motion;Decreased activity tolerance;Impaired balance (sitting and/or standing);Decreased cognition;Decreased safety  awareness;Decreased knowledge of use of DME or AE;Decreased knowledge of precautions      OT Treatment/Interventions: Self-care/ADL training;Therapeutic exercise;Therapeutic activities;Cognitive remediation/compensation;Patient/family education;Balance training;DME and/or AE instruction    OT Goals(Current goals can be found in the care plan section) Acute Rehab OT Goals Patient Stated Goal: to be able to sit up for awhile OT Goal Formulation: With patient Time For Goal Achievement: 10/13/19 Potential to Achieve Goals: Fair  OT Frequency: Min 2X/week   Barriers to D/C: Decreased caregiver support          Co-evaluation PT/OT/SLP Co-Evaluation/Treatment: Yes Reason for Co-Treatment: For patient/therapist safety PT goals addressed during session: Mobility/safety with mobility OT goals addressed during session: ADL's and self-care;Proper use of Adaptive equipment and DME      AM-PAC OT "6 Clicks" Daily Activity     Outcome Measure Help from another person eating meals?: None Help from another person taking care of personal grooming?: A Little Help from another person toileting, which includes using toliet, bedpan, or urinal?: Total Help from another person bathing (including washing, rinsing, drying)?: A Lot Help from another person to put on and taking off regular upper body clothing?: A Lot Help from another person to put on and taking off regular lower body clothing?: Total 6 Click Score: 13   End of Session Equipment Utilized During Treatment: Gait belt;Rolling walker  Activity Tolerance: Patient tolerated treatment well Patient left: in chair;with call bell/phone within reach;with chair alarm set  OT Visit Diagnosis: Muscle weakness (generalized) (M62.81);Other symptoms and signs involving cognitive function                Time: 8563-1497 OT Time Calculation (min): 33 min Charges:  OT General Charges $OT Visit: 1  Visit OT Evaluation $OT Re-eval: 1 Re-eval  Nestor Lewandowsky, OTR/L Acute Rehabilitation Services Pager: [phone] Office: [phone]  Malka So 09/29/2019, 9:50 AM

## 2019-09-29 NOTE — NC FL2 (Signed)
[place] MEDICAID FL2 LEVEL OF CARE SCREENING TOOL     IDENTIFICATION  Patient Name: Anthony Dunlap Birthdate: 21-Apr-1933 Sex: male Admission Date (Current Location): 09/25/2019  [facility] and [place] Number:  Herbalist and Address:  [facility] [place], [place] [address], [place], [place] [address]      Provider Number: [id number]  Attending Physician Name and Address:  Domenic Polite, MD  Relative Name and Phone Number:       Current Level of Care: Hospital Recommended Level of Care: [place] Prior Approval Number:    Date Approved/Denied:   PASRR Number: [id number]  Discharge Plan: SNF    Current Diagnoses: Patient Active Problem List   Diagnosis Date Noted  . Acute osteomyelitis of right foot ([place]) 09/25/2019  . Critical lower limb ischemia 08/11/2019  . Hx of total hip arthroplasty, right 08/06/2019  . Arthritis of right hip 06/12/2019  . Pelvic abscess in male [facility]) 09/11/2018  . Drug-induced hepatitis 06/05/2018  . Calculus of gallbladder without cholecystitis without obstruction   . Fungemia/Candida albicans 05/10/2018  . UTI (urinary tract infection) 05/08/2018  . Complicated UTI (urinary tract infection) 05/08/2018  . 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 Abscess of left thigh   . Pressure injury of skin 02/10/2018  . Enterococcus faecalis infection 01/27/2018  . Abscess   . Psoas abscess ([place]) 01/23/2018  . Sepsis due to Bacteroides species ([place]) 01/22/2018  . Sepsis without acute organ dysfunction ([place])   . DKA (diabetic ketoacidoses) ([place]) 01/17/2018  . Normocytic anemia 01/17/2018  . Chronic anemia 01/03/2018  . Chronic indwelling Foley catheter 01/03/2018  . Elevated alkaline phosphatase level 01/03/2018  . Hypothyroidism 01/03/2018  . Physical deconditioning 01/03/2018  . 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 HLD (hyperlipidemia) 08/19/2013  . Type 2 diabetes  mellitus ([place])   . Prostate cancer ([place])     Orientation RESPIRATION BLADDER Height & Weight     Self, Time, Situation, Place  Normal Continent, Indwelling catheter Weight: 172 lb (78 kg) Height:  5\' 8"  (172.7 cm)  BEHAVIORAL SYMPTOMS/MOOD NEUROLOGICAL BOWEL NUTRITION STATUS      Continent Diet (see discharge summary)  AMBULATORY STATUS COMMUNICATION OF NEEDS Skin   Extensive Assist Verbally Surgical wounds, Other (Comment) (gauze on left heel ulcer; closed incision on R leg w/ compression wrap)                       Personal Care Assistance Level of Assistance  Bathing, Feeding, Dressing Bathing Assistance: Maximum assistance Feeding assistance: Independent Dressing Assistance: Maximum assistance     Functional Limitations Info  Sight, Hearing, Speech Sight Info: Adequate Hearing Info: Adequate Speech Info: Adequate    SPECIAL CARE FACTORS FREQUENCY  PT (By licensed PT), OT (By licensed OT)     PT Frequency: 5x week OT Frequency: 5x week            Contractures Contractures Info: Not present    Additional Factors Info  Code Status, Allergies, Psychotropic, Insulin Sliding Scale Code Status Info: Full Code Allergies Info: Fluconazole Psychotropic Info: amitriptyline (ELAVIL) tablet 10 mg daily at bedtime Insulin Sliding Scale Info: insulin aspart (novoLOG) injection 0-15 Units 3x daily with meals; insulin aspart (novoLOG) injection 0-5 Units daily at bedtime; insulin aspart (novoLOG) injection 3 Units 3x daily with meals; insulin glargine (LANTUS) injection 20 Units daily       Current Medications (09/29/2019):  This  is the current hospital active medication list Current Facility-Administered Medications  Medication Dose Route Frequency Provider Last Rate Last Admin  . (feeding supplement) PROSource Plus liquid 30 mL  30 mL Oral BID WC Rhyne, Samantha J, PA-C   30 mL at 09/29/19 1002  . acetaminophen (TYLENOL) tablet 650 mg  650 mg Oral Q6H PRN Domenic Polite, MD       Or  . acetaminophen (TYLENOL) suppository 650 mg  650 mg Rectal Q6H PRN Domenic Polite, MD      . ALPRAZolam Duanne Moron) tablet 0.5 mg  0.5 mg Oral BID PRN Gabriel Earing, PA-C   0.5 mg at 09/28/19 2328  . amitriptyline (ELAVIL) tablet 10 mg  10 mg Oral QHS Rhyne, Samantha J, PA-C   10 mg at 09/28/19 2231  . aspirin EC tablet 81 mg  81 mg Oral Daily Rhyne, Samantha J, PA-C   81 mg at 09/29/19 1001  . colchicine tablet 0.6 mg  0.6 mg Oral Daily PRN Rhyne, Samantha J, PA-C      . doxycycline (VIBRA-TABS) tablet 100 mg  100 mg Oral Q12H Domenic Polite, MD      . ferrous sulfate tablet 325 mg  325 mg Oral Q breakfast Rhyne, Samantha J, PA-C   325 mg at 09/29/19 1001  . insulin aspart (novoLOG) injection 0-15 Units  0-15 Units Subcutaneous TID WC Domenic Polite, MD   5 Units at 09/29/19 1140  . insulin aspart (novoLOG) injection 0-5 Units  0-5 Units Subcutaneous QHS Domenic Polite, MD      . insulin aspart (novoLOG) injection 3 Units  3 Units Subcutaneous TID WC Domenic Polite, MD      . Derrill Memo ON 09/30/2019] insulin glargine (LANTUS) injection 20 Units  20 Units Subcutaneous Daily Domenic Polite, MD      . levothyroxine (SYNTHROID) tablet 100 mcg  100 mcg Oral q morning - 10a Rhyne, Samantha J, PA-C   100 mcg at 09/29/19 0541  . morphine 2 MG/ML injection 2 mg  2 mg Intravenous Q2H PRN Rhyne, Hulen Shouts, PA-C   2 mg at 09/28/19 2330  . multivitamin with minerals tablet 1 tablet  1 tablet Oral Daily Rhyne, Hulen Shouts, PA-C   1 tablet at 09/29/19 1001  . nutrition supplement (JUVEN) (JUVEN) powder packet 1 packet  1 packet Oral BID BM Rhyne, Hulen Shouts, PA-C   1 packet at 09/29/19 1509  . ondansetron (ZOFRAN) injection 4 mg  4 mg Intravenous Q6H PRN Rhyne, Samantha J, PA-C      . oxyCODONE (Oxy IR/ROXICODONE) immediate release tablet 5 mg  5 mg Oral Q4H PRN Scheeler, Carola Rhine, PA-C   5 mg at 09/29/19 0542  . pantoprazole (PROTONIX) EC tablet 40 mg  40 mg Oral Daily Rhyne, Samantha  J, PA-C   40 mg at 09/29/19 1001  . phenol (CHLORASEPTIC) mouth spray 1 spray  1 spray Mouth/Throat PRN Rhyne, Samantha J, PA-C      . polyethylene glycol (MIRALAX / GLYCOLAX) packet 17 g  17 g Oral Daily Domenic Polite, MD   17 g at 09/29/19 1137  . pravastatin (PRAVACHOL) tablet 10 mg  10 mg Oral q1800 Rhyne, Samantha J, PA-C   10 mg at 09/28/19 1729  . primidone (MYSOLINE) tablet 150 mg  150 mg Oral QHS Rhyne, Samantha J, PA-C   150 mg at 09/28/19 2231  . senna-docusate (Senokot-S) tablet 1 tablet  1 tablet Oral BID Domenic Polite, MD   1 tablet at 09/29/19 1137  .  silver sulfADIAZINE (SILVADENE) 1 % cream   Topical BID Domenic Polite, MD         Discharge Medications: Please see discharge summary for a list of discharge medications.  Relevant Imaging Results:  Relevant Lab Results:   Additional Information [id number] [place], [place]

## 2019-09-29 NOTE — Plan of Care (Signed)

## 2019-09-29 NOTE — Progress Notes (Signed)
PROGRESS NOTE    Anthony Dunlap  HUD:[id number] DOB: [birth year] DOA: 09/25/2019 PCP: [name], MD  Brief Narrative: Anthony Dunlap  is a 86 y.o. male, past medical history of diabetes mellitus, prostate cancer, aortic angioplasty, peripheral vascular disease following with [name], history of pelvic/right hip osteomyelitis on chronic antibiotic suppressive therapy for him by [name], Patient presented to ED secondary to bleeding from nonhealing heel ulcers which are chronic due to severe peripheral vascular disease, followed by vascular surgery previously underwent angiogram and did not have revascularization options on the right, BKA was recommended at the time but patient does not ready to proceed. - in ED initial x-ray of bilateral feet, concerning for osteomyelitis of right heel, so MRI right foot was obtained which confirmed osteomyelitis, no leukocytosis, no fever, patient was started empirically on Rocephin, Flagyl and vancomycin -VVS consulted, underwent right BKA 8/9   Assessment & Plan:   Acute osteomyelitis of right foot Severe peripheral vascular disease -MRI confirmed osteomyelitis of the calcaneus -no revascularization options on the right based on prior angiogram, underwent recent left anterior tibial artery angioplasty and unsuccessful antegrade recanalization of peroneal artery -Vascular surgery consulted and following -Underwent right BKA on 8/9 -Continue aspirin, Plavix and DVT prophylaxis held for surgery -Resume Plavix when okay with vascular surgery -Discontinue antibiotics -PT eval today -Monitor CBC, discharge planning  Acute postop blood loss anemia -Hemoglobin down to 7.1 today, from operative blood loss and hemodilution -Check anemia panel -Transfuse if it drops below 7  Diabetes mellitus -Oral hypoglycemics on hold  -Uncontrolled, increase Lantus to 20 units and add NovoLog meal coverage -Hemoglobin A1c is 8.1  Left heel ulcer -No evidence of  osteomyelitis of left foot on x-ray -Status post balloon angioplasty recently of left lower extremity, follow-up with vascular surgery  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 -Followed by infectious disease [name] as outpatient on suppressive chronic antibiotic therapy including Omnicef, Augmentin and doxycycline, most recently started on doxycycline 7/3 will resume this   DVT prophylaxis: Heparin SQ held-resume 8/11 low Code Status: Full code Family Communication: No family at bedside Disposition Plan:  Status is: Inpatient  Remains inpatient appropriate because:Inpatient level of care appropriate due to severity of illness  Dispo: The patient is from: Home              Anticipated d/c is to: Home with home health              Anticipated d/c date is: Likely 1 to 2 days              Patient currently is not medically stable to d/c.   Consultants:  Vascular surgery  Procedures: Right leg below-knee amputation 8/9 by Dr. Donnetta Hutching Yes Subjective: -Feels okay, no events overnight, a little tired today  Objective: Vitals:   09/29/19 0032 09/29/19 0431 09/29/19 0729 09/29/19 1138  BP: 133/74 127/65 132/77 130/69  Pulse: 99 100 (!) 102 (!) 103  Resp: 17 16 20 18   Temp: 99.8 F (37.7 C) 98.5 F (36.9 C) 98.2 F (36.8 C) 98.3 F (36.8 C)  TempSrc: Oral Oral Oral Oral  SpO2: 96% 95% 100% 97%  Weight:      Height:        Intake/Output Summary (Last 24 hours) at 09/29/2019 1501 Last data filed at 09/29/2019 1405 Gross per 24 hour  Intake 1421.84 ml  Output 1800 ml  Net -378.16 ml   Filed Weights   09/25/19 1037  Weight:  78 kg    Examination:  General exam: Chronically ill elderly male sitting up in bed, AAOx3, no distress HEENT: No JVD CVS: S1-S2, regular rate rhythm Lungs: Clear bilaterally Abdomen: Soft, nontender, bowel sounds present Extremities: Left heel ulcer with dressing, right BKA  Psychiatry:  Mood & affect  appropriate.   Data Reviewed:   CBC: Recent Labs  Lab 09/25/19 1549 09/26/19 0118 09/27/19 0231 09/28/19 0240 09/29/19 0312  WBC 10.9* 8.7 7.7 8.4 12.1*  NEUTROABS 9.0*  --   --   --   --   HGB 10.1* 8.9* 9.1* 8.8* 7.1*  HCT 32.9* 28.4* 29.6* 28.6* 23.4*  MCV 86.4 83.8 84.3 84.9 84.8  PLT 431* 357 382 383 824   Basic Metabolic Panel: Recent Labs  Lab 09/25/19 1549 09/25/19 1549 09/25/19 1957 09/26/19 0118 09/27/19 0231 09/28/19 0240 09/29/19 0312  NA 136  --   --  138 137 138 135  K 4.6  --   --  3.8 4.2 3.7 4.1  CL 102  --   --  103 103 104 102  CO2 23  --   --  22 24 23 23   GLUCOSE 146*  --   --  122* 105* 67* 149*  BUN 30*  --   --  28* 28* 27* 27*  CREATININE 1.15   < > 1.20 1.18 0.95 0.98 1.12  CALCIUM 9.0  --   --  8.7* 9.0 8.9 8.6*   < > = values in this interval not displayed.   GFR: Estimated Creatinine Clearance: 47.5 mL/min (by C-G formula based on SCr of 1.12 mg/dL). Liver Function Tests: No results for input(s): AST, ALT, ALKPHOS, BILITOT, PROT, ALBUMIN in the last 168 hours. No results for input(s): LIPASE, AMYLASE in the last 168 hours. No results for input(s): AMMONIA in the last 168 hours. Coagulation Profile: Recent Labs  Lab 09/28/19 0240  INR 1.1   Cardiac Enzymes: No results for input(s): CKTOTAL, CKMB, CKMBINDEX, TROPONINI in the last 168 hours. BNP (last 3 results) No results for input(s): PROBNP in the last 8760 hours. HbA1C: No results for input(s): HGBA1C in the last 72 hours. CBG: Recent Labs  Lab 09/28/19 0919 09/28/19 1712 09/28/19 2056 09/29/19 0648 09/29/19 1139  GLUCAP 104* 386* 124* 157* 248*   Lipid Profile: No results for input(s): CHOL, HDL, LDLCALC, TRIG, CHOLHDL, LDLDIRECT in the last 72 hours. Thyroid Function Tests: No results for input(s): TSH, T4TOTAL, FREET4, T3FREE, THYROIDAB in the last 72 hours. Anemia Panel: Recent Labs    09/29/19 1133  VITAMINB12 196  FOLATE 14.9  FERRITIN 114  TIBC 207*   IRON 13*  RETICCTPCT 1.5   Urine analysis:    Component Value Date/Time   COLORURINE YELLOW 06/12/2019 0822   APPEARANCEUR CLEAR 06/12/2019 0822   LABSPEC 1.008 06/12/2019 0822   PHURINE 7.0 06/12/2019 0822   GLUCOSEU NEGATIVE 06/12/2019 0822   HGBUR NEGATIVE 06/12/2019 0822   BILIRUBINUR NEGATIVE 06/12/2019 0822   KETONESUR NEGATIVE 06/12/2019 0822   PROTEINUR NEGATIVE 06/12/2019 0822   UROBILINOGEN 0.2 09/03/2013 0132   NITRITE POSITIVE (A) 06/12/2019 0822   LEUKOCYTESUR LARGE (A) 06/12/2019 0822   Sepsis Labs: @LABRCNTIP (procalcitonin:4,lacticidven:4)  ) Recent Results (from the past 240 hour(s))  SARS Coronavirus 2 by RT PCR (hospital order, performed in [facility] hospital lab) Nasopharyngeal Nasopharyngeal Swab     Status: None   Collection Time: 09/25/19  6:20 PM   Specimen: Nasopharyngeal Swab  Result Value Ref Range Status   SARS  Coronavirus 2 NEGATIVE NEGATIVE Final    Comment: (NOTE) SARS-CoV-2 target nucleic acids are NOT DETECTED.  The SARS-CoV-2 RNA is generally detectable in upper and lower respiratory specimens during the acute phase of infection. The lowest concentration of SARS-CoV-2 viral copies this assay can detect is 250 copies / mL. A negative result does not preclude SARS-CoV-2 infection and should not be used as the sole basis for treatment or other patient management decisions.  A negative result may occur with improper specimen collection / handling, submission of specimen other than nasopharyngeal swab, presence of viral mutation(s) within the areas targeted by this assay, and inadequate number of viral copies (<250 copies / mL). A negative result must be combined with clinical observations, patient history, and epidemiological information.  Fact Sheet for Patients:   StrictlyIdeas.no  Fact Sheet for Healthcare Providers: BankingDealers.co.za  This test is not yet approved or  cleared by the  [place] FDA and has been authorized for detection and/or diagnosis of SARS-CoV-2 by FDA under an Emergency Use Authorization (EUA).  This EUA will remain in effect (meaning this test can be used) for the duration of the COVID-19 declaration under Section 564(b)(1) of the Act, 21 U.S.C. section 360bbb-3(b)(1), unless the authorization is terminated or revoked sooner.  Performed at [facility] [address], [place], [place] [address]   Surgical pcr screen     Status: None   Collection Time: 09/26/19  4:22 AM   Specimen: Nasal Mucosa; Nasal Swab  Result Value Ref Range Status   MRSA, PCR NEGATIVE NEGATIVE Final   Staphylococcus aureus NEGATIVE NEGATIVE Final    Comment: (NOTE) The Xpert SA Assay (FDA approved for NASAL specimens in patients 35 years of age and older), is one component of a comprehensive surveillance program. It is not intended to diagnose infection nor to guide or monitor treatment. Performed at [facility], [place] [address], [place], [place] [address]      Scheduled Meds: . (feeding supplement) PROSource Plus  30 mL Oral BID WC  . amitriptyline  10 mg Oral QHS  . aspirin EC  81 mg Oral Daily  . ferrous sulfate  325 mg Oral Q breakfast  . insulin aspart  0-15 Units Subcutaneous TID WC  . insulin aspart  0-5 Units Subcutaneous QHS  . insulin glargine  14 Units Subcutaneous Daily  . levothyroxine  100 mcg Oral q morning - 10a  . multivitamin with minerals  1 tablet Oral Daily  . nutrition supplement (JUVEN)  1 packet Oral BID BM  . pantoprazole  40 mg Oral Daily  . polyethylene glycol  17 g Oral Daily  . pravastatin  10 mg Oral q1800  . primidone  150 mg Oral QHS  . senna-docusate  1 tablet Oral BID  . silver sulfADIAZINE   Topical BID   Continuous Infusions:   LOS: 4 days   Time spent: 53min  Domenic Polite, MD [facility]  09/29/2019, 3:01 PM

## 2019-09-29 NOTE — Progress Notes (Signed)
Requested wound care orders for left heel Wound care on hold til orders received from MD.

## 2019-09-29 NOTE — Evaluation (Signed)
Physical Therapy Re-Evaluation Patient Details Name: Anthony Dunlap MRN: [id number] DOB: [birth year] Today's Date: 09/29/2019   History of Present Illness  Pt is a 86 yr old male who presented due to bleeding and nonhealing heel ulcers. S/p right below knee amputation 09/28/2019. PMH but not limited to  pelvic/right hip osteomyelitis, prostate ca, hernia, R total hip arthroplasty   Clinical Impression  Pt re-evaluated s/p right BKA. Pt presents with decreased functional mobility secondary to decreased ROM, weakness/debility, poor sitting balance, cognitive impairments. Requiring two person moderate assist to stand to a walker. Utilized Geophysicist/field seismologist Denna Haggard) to transfer to chair for safety. Suspect slow progress based on assessment. In light of deficits and decreased caregiver support, recommending SNF at discharge to maximize functional mobility.     Follow Up Recommendations SNF    Equipment Recommendations  None recommended by PT    Recommendations for Other Services       Precautions / Restrictions Precautions Precautions: Fall Restrictions Weight Bearing Restrictions: Yes RLE Weight Bearing: Non weight bearing      Mobility  Bed Mobility Overal bed mobility: Needs Assistance Bed Mobility: Supine to Sit     Supine to sit: Mod assist;+2 for safety/equipment     General bed mobility comments: ModA (+2 safety) for progression to edge of bed, assist for trunk elevation. Cues to not weightbear through R residual limb with transition  Transfers Overall transfer level: Needs assistance Equipment used: Rolling walker (2 wheeled);Ambulation equipment used Transfers: Sit to/from Stand Sit to Stand: Mod assist;+2 physical assistance         General transfer comment: ModA + 2 to rise from edge of bed to walker, cues for placing left foot posteriorly and upright positioning. Pt with crouched posture so returned to seated position. Utilized Stedy to transfer over from bed to  chair  Ambulation/Gait             General Gait Details: unable  Stairs            Wheelchair Mobility    Modified Rankin (Stroke Patients Only)       Balance Overall balance assessment: Needs assistance Sitting-balance support: Feet unsupported;Bilateral upper extremity supported Sitting balance-Leahy Scale: Poor Sitting balance - Comments: Requiring up to modA, progressing to min guard towards end but still relying on BUE support. Cues for anterior weight shift Postural control: Posterior lean                                   Pertinent Vitals/Pain Pain Assessment: Faces Faces Pain Scale: No hurt    Home Living Family/patient expects to be discharged to:: Private residence Living Arrangements: Spouse/significant other;Other relatives Available Help at Discharge: Family;Available PRN/intermittently Type of Home: House Home Access: Ramped entrance     Home Layout: One level Home Equipment: Bedside commode;Shower seat;Walker - 2 wheels;Cane - single point;Walker - 4 wheels;Grab bars - tub/shower Additional Comments: Wife has dementia, pt has an aide who comes in 3x a week     Prior Function Level of Independence: Independent with assistive device(s)         Comments: Uses RW     Hand Dominance   Dominant Hand: Right    Extremity/Trunk Assessment   Upper Extremity Assessment Upper Extremity Assessment: Defer to OT evaluation    Lower Extremity Assessment Lower Extremity Assessment: RLE deficits/detail;LLE deficits/detail RLE Deficits / Details: Knee extension limited 10 degrees from neutral, able  to perform LAQ LLE Deficits / Details: At least anti gravity strength    Cervical / Trunk Assessment Cervical / Trunk Assessment: Kyphotic  Communication   Communication: No difficulties  Cognition Arousal/Alertness: Awake/alert Behavior During Therapy: WFL for tasks assessed/performed Overall Cognitive Status: Impaired/Different  from baseline Area of Impairment: Orientation;Attention;Following commands;Safety/judgement;Awareness;Problem solving                 Orientation Level: Disoriented to;Time Current Attention Level: Sustained   Following Commands: Follows one step commands inconsistently Safety/Judgement: Decreased awareness of safety;Decreased awareness of deficits Awareness: Intellectual Problem Solving: Requires verbal cues;Difficulty sequencing;Slow processing General Comments: Pt following <50% of 1 step commands, requires max cueing and repetition to perform. Not oriented to time, unable to state day of week and stating month was July. Significantly increased time for all processing, decreased awareness of safety/deficits. Unable to problem solve how to use incentive spirometer.       General Comments      Exercises Amputee Exercises Knee Extension: Right;10 reps;Seated   Assessment/Plan    PT Assessment Patient needs continued PT services  PT Problem List Decreased strength;Decreased range of motion;Decreased activity tolerance;Decreased balance;Decreased mobility;Decreased coordination;Decreased knowledge of use of DME;Decreased safety awareness;Pain       PT Treatment Interventions DME instruction;Gait training;Functional mobility training;Therapeutic activities;Therapeutic exercise;Neuromuscular re-education;Balance training;Patient/family education    PT Goals (Current goals can be found in the Care Plan section)  Acute Rehab PT Goals Patient Stated Goal: to be able to sit up for awhile PT Goal Formulation: With patient Time For Goal Achievement: 10/13/19 Potential to Achieve Goals: Fair    Frequency Min 2X/week   Barriers to discharge        Co-evaluation PT/OT/SLP Co-Evaluation/Treatment: Yes Reason for Co-Treatment: For patient/therapist safety;To address functional/ADL transfers;Necessary to address cognition/behavior during functional activity PT goals addressed  during session: Mobility/safety with mobility         AM-PAC PT "6 Clicks" Mobility  Outcome Measure Help needed turning from your back to your side while in a flat bed without using bedrails?: A Little Help needed moving from lying on your back to sitting on the side of a flat bed without using bedrails?: A Lot Help needed moving to and from a bed to a chair (including a wheelchair)?: A Lot Help needed standing up from a chair using your arms (e.g., wheelchair or bedside chair)?: A Lot Help needed to walk in hospital room?: Total Help needed climbing 3-5 steps with a railing? : Total 6 Click Score: 11    End of Session Equipment Utilized During Treatment: Gait belt Activity Tolerance: Patient tolerated treatment well Patient left: in chair;with call bell/phone within reach;with chair alarm set Nurse Communication: Mobility status;Need for lift equipment PT Visit Diagnosis: Unsteadiness on feet (R26.81);Other abnormalities of gait and mobility (R26.89);Difficulty in walking, not elsewhere classified (R26.2)    Time: 0092-3300 PT Time Calculation (min) (ACUTE ONLY): 32 min   Charges:   PT Evaluation $PT Re-evaluation: 1 Re-eval            Wyona Almas, PT, DPT Acute Rehabilitation Services Pager [phone] Office [phone]   Deno Etienne 09/29/2019, 9:38 AM

## 2019-09-29 NOTE — Progress Notes (Signed)
Inpatient Diabetes Program Recommendations  AACE/ADA: New Consensus Statement on Inpatient Glycemic Control (2015)  Target Ranges:  Prepandial:   less than 140 mg/dL      Peak postprandial:   less than 180 mg/dL (1-2 hours)      Critically ill patients:  140 - 180 mg/dL   Lab Results  Component Value Date   GLUCAP 248 (H) 09/29/2019   HGBA1C 8.1 (H) 09/25/2019    Review of Glycemic Control Results for CARLEE, TESFAYE (MRN [id number]) as of 09/29/2019 13:42  Ref. Range 09/28/2019 09:19 09/28/2019 17:12 09/28/2019 20:56 09/29/2019 06:48 09/29/2019 11:39  Glucose-Capillary Latest Ref Range: 70 - 99 mg/dL 104 (H) 386 (H) 124 (H) 157 (H) 248 (H)   Diabetes history: DM2 Outpatient Diabetes medications: Soliqua 40 units QAM, Novolog 0-15 units TID with meals, Amaryl 4 mg QAM Current orders for Inpatient glycemic control:  Lantus 14 units Novolog 0-15 units tid + hs  Inpatient Diabetes Program Recommendations:     Consider: -Increase Lantus to 20 units -Add Novolog 5 units tid meal coverage if eats 50%  Thank you, [name] E. Saniya Tranchina, RN, MSN, CDE  Diabetes Coordinator Inpatient Glycemic Control Team Team Pager [phone] (8am-5pm) 09/29/2019 1:41 PM

## 2019-09-29 NOTE — Consult Note (Signed)
[facility] Nurse Consult Note: Patient receiving care in [place].  Consult completed remotely after review of record, including image of left heel. Reason for Consult: "recs for left heel wound care" Wound type: full thickness wound.  Per vascular surgeon note from 8/10, the patient is a poor candidate for revascularization on the left. Pressure Injury POA: Yes Measurement: To be provided by the bedside RN in the flowsheet section Wound bed: see photo Drainage (amount, consistency, odor)  Periwound: callus, intact Dressing procedure/placement/frequency:  Apply silvadene to left heel, cover with gauze, secure with kerlex. Perform twice daily.  Place foot in Prevalon boot. Monitor the wound area(s) for worsening of condition such as: Signs/symptoms of infection,  Increase in size,  Development of or worsening of odor, Development of pain, or increased pain at the affected locations.  Notify the medical team if any of these develop.  Thank you for the consult.  [facility] nurse will not follow at this time.  Please re-consult the [facility] team if needed.  Val Riles, RN, MSN, CWOCN, CNS-BC, pager [phone]

## 2019-09-29 NOTE — Progress Notes (Addendum)
  Progress Note    09/29/2019 7:11 AM 1 Day Post-Op  Subjective:  Says the pain pill helped.   Afebrile   Vitals:   09/29/19 0032 09/29/19 0431  BP: 133/74 127/65  Pulse: 99 100  Resp: 17 16  Temp: 99.8 F (37.7 C) 98.5 F (36.9 C)  SpO2: 96% 95%    Physical Exam: Incisions:  Bandage in tact and clean and dry Extremities:  Right knee bent but he is able to straighten somewhat   CBC    Component Value Date/Time   WBC 12.1 (H) 09/29/2019 0312   RBC 2.76 (L) 09/29/2019 0312   HGB 7.1 (L) 09/29/2019 0312   HCT 23.4 (L) 09/29/2019 0312   PLT 346 09/29/2019 0312   MCV 84.8 09/29/2019 0312   MCH 25.7 (L) 09/29/2019 0312   MCHC 30.3 09/29/2019 0312   RDW 13.8 09/29/2019 0312   LYMPHSABS 1.2 09/25/2019 1549   MONOABS 0.5 09/25/2019 1549   EOSABS 0.1 09/25/2019 1549   BASOSABS 0.0 09/25/2019 1549    BMET    Component Value Date/Time   NA 135 09/29/2019 0312   K 4.1 09/29/2019 0312   CL 102 09/29/2019 0312   CO2 23 09/29/2019 0312   GLUCOSE 149 (H) 09/29/2019 0312   BUN 27 (H) 09/29/2019 0312   CREATININE 1.12 09/29/2019 0312   CREATININE 1.19 (H) 08/20/2019 1319   CALCIUM 8.6 (L) 09/29/2019 0312   GFRNONAA 60 (L) 09/29/2019 0312   GFRNONAA 56 (L) 08/20/2019 1319   GFRAA >60 09/29/2019 0312   GFRAA 65 08/20/2019 1319    INR    Component Value Date/Time   INR 1.1 09/28/2019 0240     Intake/Output Summary (Last 24 hours) at 09/29/2019 0711 Last data filed at 09/29/2019 0600 Gross per 24 hour  Intake 1681.84 ml  Output 1650 ml  Net 31.84 ml     Assessment/Plan:  86 y.o. male is s/p right below knee amputation  1 Day Post-Op  -bandage on right BKA is clean and dry-we will remove this tomorrow. -please reinforce to pt to continue working on straightening right knee -left heel on bed - needs to be floated and I put some blankets under his left leg to do this and discussed with pt.  Left heel is malodorous-he is at high risk for amputation on the left  as well.  Will order Prevalon boot   Leontine Locket, [place] [facility] [phone] 09/29/2019 7:11 AM    I have seen and evaluated the patient. I agree with the PA note as documented above. POD#1 s/p R BKA.  Dressing c/d/i.  Will remove dressing tomorrow.  Some pain issues overnight that seem to be improving.  Will need to float left heel s/p revascularization - high risk for limb loss on that side given extent of tissue loss.  PT OT etc.   Marty Heck, MD [facility]: [phone]

## 2019-09-29 NOTE — Progress Notes (Signed)
Inpatient Rehabilitation Admissions Coordinator  Noted postoperative therapy assessments that SNF is recommended due to his abilities and lack of caregiver support. We will sign off at this time.   Danne Baxter, RN, MSN Rehab Admissions Coordinator [phone] 09/29/2019 12:26 PM

## 2019-09-29 NOTE — TOC Initial Note (Addendum)
Transition of Care [facility]) - Initial/Assessment Note    Patient Details  Name: Anthony Dunlap MRN: [id number] Date of Birth: Jan 05, 1934  Transition of Care [facility]) CM/SW Contact:    Alexander Mt, LCSW Phone Number: 09/29/2019, 3:45 PM  Clinical Narrative:                 Upon chart review pt A&Ox4 with some confusion. Familiar with pt from previous hospitalizations. Called pt daughter [name] at [phone]. Introduced self, role, reason for call. Pt from home, has had home health aides and services in the past. [name] is disappointed to hear that CIR is not an option and is a bit wary of SNF after a not so positive experience in the past. She is interested in [facility] and [facility]. CSW explained I would reach out to them and f/u with admissions liaisons. Pt daughter expresses that she knows pt will be hesitant to go but she is in agreement he needs rehab and wound care.   Pt has had both vaccinations. [facility] able to offer, attempted to reach [facility] to no response. When I called again was able to reach admissions and they will review referral.   Expected Discharge Plan: [place] Barriers to Discharge: Continued Medical Work up, Ship broker   Patient Goals and CMS Choice Patient states their goals for this hospitalization and ongoing recovery are:: for him to get care and therapy if he cant go to inpatient rehab CMS Medicare.gov Compare Post Acute Care list provided to:: Patient Represenative (must comment) (pt daughter [name]) Choice offered to / list presented to : Patient, Adult Children  Expected Discharge Plan and Services Expected Discharge Plan: Skilled Nursing Facility In-house Referral: Clinical Social Work Discharge Planning Services: CM Consult Post Acute Care Choice: [place] Living arrangements for the past 2 months: [place]  Prior Living Arrangements/Services Living arrangements for  the past 2 months: Single Family Home Lives with:: Self, Spouse Patient language and need for interpreter reviewed:: Yes (no needs) Do you feel safe going back to the place where you live?: Yes      Need for Family Participation in Patient Care: Yes (Comment) (assistance with daily cares) Care giver support system in place?: Yes (comment) (adult children) Current home services: DME, Homehealth aide Criminal Activity/Legal Involvement Pertinent to Current Situation/Hospitalization: No - Comment as needed  Activities of Daily Living Home Assistive Devices/Equipment: None ADL Screening (condition at time of admission) Patient's cognitive ability adequate to safely complete daily activities?: Yes Is the patient deaf or have difficulty hearing?: No Does the patient have difficulty seeing, even when wearing glasses/contacts?: No Does the patient have difficulty concentrating, remembering, or making decisions?: No Patient able to express need for assistance with ADLs?: Yes Does the patient have difficulty dressing or bathing?: Yes Independently performs ADLs?: Yes (appropriate for developmental age) Does the patient have difficulty walking or climbing stairs?: Yes Weakness of Legs: Both Weakness of Arms/Hands: None  Permission Sought/Granted Permission sought to share information with : Family Supports Permission granted to share information with : Yes, Verbal Permission Granted (pt w/ some documented confusion)  Share Information with NAME: [name]  Permission granted to share info w AGENCY: SNFs- pref for [facility] and [place] granted to share info w Relationship: daughter  Permission granted to share info w Contact Information: [phone]  Emotional Assessment Appearance:: Other (Comment Required (telephonic assessment w/ pt daughter [name]) Attitude/Demeanor/Rapport: Other (comment) (telephonic  assessment w/ pt daughter [name]) Affect (typically observed):  Other (comment) (telephonic assessment w/ pt daughter [name]) Orientation: : Fluctuating Orientation (Suspected and/or reported Sundowners), Oriented to Situation, Oriented to  Time, Oriented to Place, Oriented to Self Alcohol / Substance Use: Not Applicable Psych Involvement: No (comment)  Admission diagnosis:  Acute osteomyelitis of right foot ([place]) [M86.171] Osteomyelitis of right foot, unspecified type [facility]) [M86.9] Patient Active Problem List   Diagnosis Date Noted  . Acute osteomyelitis of right foot ([place]) 09/25/2019  . Critical lower limb ischemia 08/11/2019  . Hx of total hip arthroplasty, right 08/06/2019  . Arthritis of right hip 06/12/2019  . Pelvic abscess in male [facility]) 09/11/2018  . Drug-induced hepatitis 06/05/2018  . Calculus of gallbladder without cholecystitis without obstruction   . Fungemia/Candida albicans 05/10/2018  . UTI (urinary tract infection) 05/08/2018  . Complicated UTI (urinary tract infection) 05/08/2018  . 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 Abscess of left thigh   . Pressure injury of skin 02/10/2018  . Enterococcus faecalis infection 01/27/2018  . Abscess   . Psoas abscess ([place]) 01/23/2018  . Sepsis due to Bacteroides species ([place]) 01/22/2018  . Sepsis without acute organ dysfunction ([place])   . DKA (diabetic ketoacidoses) ([place]) 01/17/2018  . Normocytic anemia 01/17/2018  . Chronic anemia 01/03/2018  . Chronic indwelling Foley catheter 01/03/2018  . Elevated alkaline phosphatase level 01/03/2018  . Hypothyroidism 01/03/2018  . Physical deconditioning 01/03/2018  . 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 HLD (hyperlipidemia) 08/19/2013  . Type 2 diabetes mellitus ([place])   . Prostate cancer [facility])    PCP:  [name], MD Pharmacy:   [facility], [address] [place] [address] Phone: [phone] Fax:  [phone]   Readmission Risk Interventions Readmission Risk Prevention Plan 09/29/2019 05/09/2018  Transportation Screening Complete Complete  PCP or Specialist Appt within 5-7 Days Not Complete -  Not Complete comments plan for SNF -  PCP or Specialist Appt within 3-5 Days - Complete  Home Care Screening Complete -  Medication Review (RN CM) Referral to [place] or [place] - Complete  Social Work Consult for [place] Planning/Counseling - Complete  Palliative Care Screening - Not Applicable  Medication Review Press photographer) - Complete  Some recent data might be hidden

## 2019-09-30 LAB — BASIC METABOLIC PANEL
Anion gap: 10 (ref 5–15)
BUN: 24 mg/dL — ABNORMAL HIGH (ref 8–23)
CO2: 24 mmol/L (ref 22–32)
Calcium: 8.9 mg/dL (ref 8.9–10.3)
Chloride: 99 mmol/L (ref 98–111)
Creatinine, Ser: 1.03 mg/dL (ref 0.61–1.24)
GFR calc Af Amer: >60 mL/min (ref >60)
GFR calc non Af Amer: >60 mL/min (ref >60)
Glucose, Bld: 180 mg/dL — ABNORMAL HIGH (ref 70–99)
Potassium: 4.2 mmol/L (ref 3.5–5.1)
Sodium: 133 mmol/L — ABNORMAL LOW (ref 135–145)

## 2019-09-30 LAB — GLUCOSE, CAPILLARY
Glucose-Capillary: 182 mg/dL — ABNORMAL HIGH (ref 70–99)
Glucose-Capillary: 353 mg/dL — ABNORMAL HIGH (ref 70–99)
Glucose-Capillary: 244 mg/dL — ABNORMAL HIGH (ref 70–99)

## 2019-09-30 LAB — CBC
HCT: 23.9 % — ABNORMAL LOW (ref 39.0–52.0)
Hemoglobin: 7.4 g/dL — ABNORMAL LOW (ref 13.0–17.0)
MCH: 26.1 pg (ref 26.0–34.0)
MCHC: 31 g/dL (ref 30.0–36.0)
MCV: 84.2 fL (ref 80.0–100.0)
Platelets: 309 10*3/uL (ref 150–400)
RBC: 2.84 MIL/uL — ABNORMAL LOW (ref 4.22–5.81)
RDW: 13.8 % (ref 11.5–15.5)
WBC: 10 10*3/uL (ref 4.0–10.5)
nRBC: 0 % (ref 0.0–0.2)

## 2019-09-30 LAB — SURGICAL PATHOLOGY

## 2019-09-30 MED ORDER — CLOPIDOGREL BISULFATE 75 MG PO TABS
75.0000 mg | ORAL_TABLET | Freq: Every day | ORAL | Status: DC
  Administered 2019-09-30 – 2019-10-02 (×3): 75 mg via ORAL
  Filled 2019-09-30 (×3): qty 1

## 2019-09-30 MED ORDER — SILVER SULFADIAZINE 1 % EX CREA: TOPICAL_CREAM | Freq: Two times a day (BID) | CUTANEOUS | 0 refills | Status: DC

## 2019-09-30 MED ORDER — PANTOPRAZOLE SODIUM 40 MG PO TBEC: 40.0000 mg | DELAYED_RELEASE_TABLET | Freq: Every day | ORAL | Status: AC

## 2019-09-30 MED ORDER — ADULT MULTIVITAMIN W/MINERALS CH: 1.0000 | ORAL_TABLET | Freq: Every day | ORAL | Status: DC

## 2019-09-30 MED ORDER — ALPRAZOLAM 0.5 MG PO TABS: 0.5000 mg | ORAL_TABLET | Freq: Two times a day (BID) | ORAL | 0 refills | Status: AC | PRN

## 2019-09-30 MED ORDER — OXYCODONE HCL 5 MG PO TABS: 5.0000 mg | ORAL_TABLET | ORAL | 0 refills | Status: DC | PRN

## 2019-09-30 MED ORDER — PROSOURCE PLUS PO LIQD: 30.0000 mL | Freq: Two times a day (BID) | ORAL | Status: DC

## 2019-09-30 MED ORDER — SENNOSIDES-DOCUSATE SODIUM 8.6-50 MG PO TABS: 1.0000 | ORAL_TABLET | Freq: Two times a day (BID) | ORAL | Status: DC

## 2019-09-30 MED ORDER — FUROSEMIDE 20 MG PO TABS: 20.0000 mg | ORAL_TABLET | Freq: Every day | ORAL | Status: DC

## 2019-09-30 MED ORDER — FERROUS SULFATE 325 (65 FE) MG PO TABS: 325.0000 mg | ORAL_TABLET | Freq: Two times a day (BID) | ORAL | 3 refills | Status: AC

## 2019-09-30 MED ORDER — JUVEN PO PACK: 1.0000 | PACK | Freq: Two times a day (BID) | ORAL | 0 refills | Status: DC

## 2019-09-30 NOTE — Progress Notes (Addendum)
  Progress Note    09/30/2019 8:55 AM 2 Days Post-Op  Subjective:  No complaints this morning   Vitals:   09/30/19 0455 09/30/19 0817  BP: 132/78 112/80  Pulse: (!) 102 (!) 104  Resp: 15 17  Temp: 98.4 F (36.9 C) 98 F (36.7 C)  SpO2: 98% 95%   Physical Exam: Cardiac:  tachycardic Lungs:  Non labored Incisions:  Right below knee amputation site clean, dry and staples intact. Mild skin separation between staples. No drainage. No hematoma or fluid collections. Dressings reapplied Extremities:  Bilateral legs warm. Left heel wound stable. Palpable DP. Left foot warm. Left foot redressed Neurologic: alert and oriented  CBC    Component Value Date/Time   WBC 10.0 09/30/2019 0450   RBC 2.84 (L) 09/30/2019 0450   HGB 7.4 (L) 09/30/2019 0450   HCT 23.9 (L) 09/30/2019 0450   PLT 309 09/30/2019 0450   MCV 84.2 09/30/2019 0450   MCH 26.1 09/30/2019 0450   MCHC 31.0 09/30/2019 0450   RDW 13.8 09/30/2019 0450   LYMPHSABS 1.2 09/25/2019 1549   MONOABS 0.5 09/25/2019 1549   EOSABS 0.1 09/25/2019 1549   BASOSABS 0.0 09/25/2019 1549    BMET    Component Value Date/Time   NA 133 (L) 09/30/2019 0450   K 4.2 09/30/2019 0450   CL 99 09/30/2019 0450   CO2 24 09/30/2019 0450   GLUCOSE 180 (H) 09/30/2019 0450   BUN 24 (H) 09/30/2019 0450   CREATININE 1.03 09/30/2019 0450   CREATININE 1.19 (H) 08/20/2019 1319   CALCIUM 8.9 09/30/2019 0450   GFRNONAA >60 09/30/2019 0450   GFRNONAA 56 (L) 08/20/2019 1319   GFRAA >60 09/30/2019 0450   GFRAA 65 08/20/2019 1319    INR    Component Value Date/Time   INR 1.1 09/28/2019 0240     Intake/Output Summary (Last 24 hours) at 09/30/2019 0855 Last data filed at 09/30/2019 0600 Gross per 24 hour  Intake 600 ml  Output 1525 ml  Net -925 ml     Assessment/Plan:  86 y.o. male is s/p  Right below knee amputation 2 Days Post-Op. Doing well post op. Pain well controlled. Right BKA healing well, intact. Left heel continue to keep  protected and float. continue Prevalon boot. Remains at high risk for limb loss. Pending d/c to SNF - Okay from vascular standpoint for Discharge. Will need to follow up in the office in 3 weeks for staple removal    Mar[facility] [phone] 09/30/2019 8:55 AM   I have seen and evaluated the patient. I agree with the PA note as documented above.  Dressings removed from right BKA today.  BKA looks clean and dry.  Discussed staples staying for 3 weeks.  Left DP is palpable after revascularization of the left leg.  He has a large heel ulcer and high risk for limb loss.  Will need to float his heel and take extra good care of this.  Marty Heck, MD [facility] [place] Office: [phone]

## 2019-09-30 NOTE — TOC Progression Note (Signed)
Transition of Care [facility]) - Progression Note    Patient Details  Name: Anthony Dunlap MRN: [id number] Date of Birth: [birth year]  Transition [facility]) CM/SW [place], RN Phone Number: 09/30/2019, 4:14 PM  Clinical Narrative:    Case Management spoke with the patient's daughter, [name], and gave Medicare choice regarding SNF placement - chose [facility] and spoke with liason who accepted patient to the facility - pending insurance authorization.  I called [facility] and started insurance authorization for the patient for start date tomorrow - ref # [id number] - clinicals were faxed to [facility] and waiting insurance authorization.   Expected Discharge Plan: Skilled Nursing Facility Barriers to Discharge: Continued Medical Work up, Ship broker  Expected Discharge Plan and Services Expected Discharge Plan: [place] In-house Referral: Clinical Social Work Discharge Planning Services: CM Consult Post Acute Care Choice: [place] arrangements for the past 2 months: Single Family Home Expected Discharge Date: 09/30/19                                     Social Determinants of Health (SDOH) Interventions    Readmission Risk Interventions Readmission Risk Prevention Plan 09/29/2019 05/09/2018  Transportation Screening Complete Complete  PCP or Specialist Appt within 5-7 Days Not Complete -  Not Complete comments plan for SNF -  PCP or Specialist Appt within 3-5 Days - Complete  Home Care Screening Complete -  Medication Review (RN CM) Referral to [place] or [place] - Complete  Social Work Consult for [place] Planning/Counseling - Complete  Palliative Care Screening - Not Applicable  Medication Review Press photographer) - Complete  Some recent data might be hidden

## 2019-09-30 NOTE — Progress Notes (Deleted)
Critical WBC count received from lab.  MD aware.

## 2019-09-30 NOTE — Care Management Important Message (Signed)
Important Message  Patient Details  Name: Anthony Dunlap MRN: [id number] Date of Birth: [birth year]   Medicare Important Message Given:  Yes     Orbie Pyo 09/30/2019, 3:33 PM

## 2019-09-30 NOTE — Discharge Summary (Signed)
Physician Discharge Summary  Anthony Dunlap WPY:[id number] DOB: [birth year] DOA: 09/25/2019  PCP: [name], MD  Admit date: 09/25/2019 Discharge date: 09/30/2019   Discharge disposition: snf   Recommendations for Outpatient Follow-Up:   staples in right BKA staying for 3 weeks. -large heel ulcer and high risk for limb loss.  Will need to float his heel and take extra good care of this. 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  Diet recommendation: Low sodium, heart healthy.  Carbohydrate-modified  Wound care: Apply silvadene to left heel, cover with gauze, secure with kerlex. Perform twice daily.  Place foot in Prevalon boot.  Code status: Full.   History of Present Illness:   Anthony Dunlap  is a 86 y.o. male, past medical history of diabetes mellitus, prostate cancer, aortic angioplasty, peripheral vascular disease following with [name], history of pelvic/right hip osteomyelitis on chronic antibiotic suppressive therapy for him by [name], Patient presents to ED secondary to bleeding, and nonhealing heel ulcers, patient has bilateral heel ulcers, but report he has been having trouble with the right heel ulcer, which has been bleeding, report he has a chronic wound, due to severe peripheral vascular disease, reports he is having his wound for several month, and had a home health aide that addresses it, reports some bleeding this morning, he denies any injury, any associated pains, no fever, no chills, no nausea or vomiting, no new tingling or numbness, and followed closely with vascular surgery, he had balloon angioplasty of left lower extremity, and there is no  revascularization option of the right lower extremity where he was offered BKA, for which by then he did not agree to initially, now in ED he says he will consider it gets indicated by vascular surgery.  - in ED initial x-ray of bilateral feet, showing suspicion of osteomyelitis of right heel, so MRI right foot was obtained which confirmed osteomyelitis, no leukocytosis, no fever, patient was started empirically on Rocephin, Flagyl and vancomycin, [facility] hospitalist consulted to admit.   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 -Followed by infectious disease Dr. Lucianne Lei dam as outpatient on suppressive chronic antibiotic therapy including Augmentin and doxycycline-- resume     Medical Consultants:    vascular  Discharge Exam:   Vitals:   09/30/19 0455 09/30/19 0817  BP: 132/78 112/80  Pulse: (!) 102 (!) 104  Resp: 15 17  Temp: 98.4 F (36.9 C) 98 F (36.7 C)  SpO2: 98% 95%   Vitals:   09/29/19 1138 09/29/19 2055 09/30/19 0455 09/30/19 0817  BP: 130/69 132/72 132/78 112/80  Pulse: (!) 103 (!) 106 (!) 102 (!) 104   Resp: 18 15 15 17   Temp: 98.3 F (36.8 C) 98.2 F (36.8 C) 98.4 F (36.9 C) 98 F (36.7 C)  TempSrc: Oral Oral Oral Oral  SpO2: 97% 95% 98% 95%  Weight:      Height:        General exam: Appears calm and comfortable.   The results of significant diagnostics from this hospitalization (including imaging, microbiology, ancillary and laboratory) are listed below for reference.     Procedures and Diagnostic Studies:   MR FOOT RIGHT WO CONTRAST  Result Date: 09/25/2019 CLINICAL DATA:  Nonhealing heel wound.  Abnormal x-ray EXAM: MRI OF THE RIGHT HINDFOOT WITHOUT CONTRAST TECHNIQUE: Multiplanar, multisequence MR imaging of the right hindfoot was performed. No intravenous contrast was administered. COMPARISON:  X-ray 09/25/2019 FINDINGS: Technical note: Motion degraded exam. Bones/Joint/Cartilage There is cortical destruction involving the posterolateral cortex of the posterior calcaneus with extensive bone marrow edema and confluent low T1 signal compatible with acute osteomyelitis (series 7, images 7-10; series 9, images 17-21). Destructive changes involve the lateral most aspect of the Achilles insertion. The remaining visualized bony structures of the hindfoot remain intact. No fracture or dislocation. No subtalar or tibiotalar joint effusion. Ligaments The anterior and posterior tibiofibular ligaments are intact. The anterior and posterior talofibular ligaments are intact. Intact calcaneofibular ligament. Deltoid ligament and spring ligament complex intact. Muscles and Tendons There is tendinosis with interstitial tearing of the distal Achilles tendon, particularly along its lateral margin at site of osteomyelitis. No complete retracted tear. No retrocalcaneal bursal fluid. The peroneal tendons, posteromedial ankle tendons, and anterior ankle tendons appear intact. No tenosynovitis. Soft tissues Large soft tissue ulceration at the posterolateral aspect of the hindfoot extending to the underlying  bone of the posterior calcaneus. No organized fluid collection. There is surrounding soft tissue edema suggesting cellulitis. IMPRESSION: 1. Large soft tissue ulceration at the posterolateral aspect of the right hindfoot extending to the underlying bone of the posterior calcaneus. Acute osteomyelitis of the posterior calcaneus. 2. Tendinosis with partial-thickness tearing of the distal Achilles tendon, particularly along its lateral margin at site of calcaneal osteomyelitis. No complete retracted tear. Electronically Signed   By: Davina Poke D.O.   On: 09/25/2019 15:44   DG Foot Complete Left  Result Date: 09/25/2019 CLINICAL DATA:  Diabetic heel wound EXAM: LEFT FOOT - COMPLETE 3+ VIEW COMPARISON:  08/05/2019 FINDINGS: No soft tissue defect is seen at the hindfoot. There are bidirectional calcaneal enthesophytes. No bony erosive changes to suggest osteomyelitis. No acute fracture or dislocation. Similar degenerative findings compared to prior. Vascular calcifications. No soft tissue gas. IMPRESSION: No radiographic evidence of osteomyelitis of the left foot. Electronically Signed   By: Davina Poke D.O.   On: 09/25/2019 14:17   DG Foot Complete Right  Result Date: 09/25/2019 CLINICAL DATA:  Bilateral heel wounds.  Diabetes. EXAM: RIGHT FOOT COMPLETE - 3+ VIEW COMPARISON:  08/05/2019. FINDINGS: Soft tissue swelling. No radiopaque foreign body. Dystrophic soft tissue calcifications. Mild erosive changes of the posterior surface and plantar surface of the calcaneus cannot be excluded on today's exam. Osteomyelitis cannot be excluded. MRI can be obtained to further evaluate. Diffuse degenerative change. No evidence of fracture or dislocation. IMPRESSION: Soft tissue swelling. Mild erosive changes of the posterior and plantar surfaces of the calcaneus cannot be excluded  on today's exam. Osteomyelitis cannot be excluded. MRI can be obtained to further evaluate. Electronically Signed   By: Marcello Moores  Register    On: 09/25/2019 14:18     Labs:   Basic Metabolic Panel: Recent Labs  Lab 09/26/19 0118 09/26/19 0118 09/27/19 0231 09/27/19 0231 09/28/19 0240 09/28/19 0240 09/29/19 0312 09/30/19 0450  NA 138  --  137  --  138  --  135 133*  K 3.8   < > 4.2   < > 3.7   < > 4.1 4.2  CL 103  --  103  --  104  --  102 99  CO2 22  --  24  --  23  --  23 24  GLUCOSE 122*  --  105*  --  67*  --  149* 180*  BUN 28*  --  28*  --  27*  --  27* 24*  CREATININE 1.18  --  0.95  --  0.98  --  1.12 1.03  CALCIUM 8.7*  --  9.0  --  8.9  --  8.6* 8.9   < > = values in this interval not displayed.   GFR Estimated Creatinine Clearance: 51.7 mL/min (by C-G formula based on SCr of 1.03 mg/dL). Liver Function Tests: No results for input(s): AST, ALT, ALKPHOS, BILITOT, PROT, ALBUMIN in the last 168 hours. No results for input(s): LIPASE, AMYLASE in the last 168 hours. No results for input(s): AMMONIA in the last 168 hours. Coagulation profile Recent Labs  Lab 09/28/19 0240  INR 1.1    CBC: Recent Labs  Lab 09/25/19 1549 09/25/19 1549 09/26/19 0118 09/27/19 0231 09/28/19 0240 09/29/19 0312 09/30/19 0450  WBC 10.9*   < > 8.7 7.7 8.4 12.1* 10.0  NEUTROABS 9.0*  --   --   --   --   --   --   HGB 10.1*   < > 8.9* 9.1* 8.8* 7.1* 7.4*  HCT 32.9*   < > 28.4* 29.6* 28.6* 23.4* 23.9*  MCV 86.4   < > 83.8 84.3 84.9 84.8 84.2  PLT 431*   < > 357 382 383 346 309   < > = values in this interval not displayed.   Cardiac Enzymes: No results for input(s): CKTOTAL, CKMB, CKMBINDEX, TROPONINI in the last 168 hours. BNP: Invalid input(s): POCBNP CBG: Recent Labs  Lab 09/29/19 0648 09/29/19 1139 09/29/19 1756 09/29/19 2039 09/30/19 0658  GLUCAP 157* 248* 384* 295* 182*   D-Dimer No results for input(s): DDIMER in the last 72 hours. Hgb A1c No results for input(s): HGBA1C in the last 72 hours. Lipid Profile No results for input(s): CHOL, HDL, LDLCALC, TRIG, CHOLHDL, LDLDIRECT in the last 72  hours. Thyroid function studies No results for input(s): TSH, T4TOTAL, T3FREE, THYROIDAB in the last 72 hours.  Invalid input(s): FREET3 Anemia work up Recent Labs    09/29/19 1133  VITAMINB12 196  FOLATE 14.9  FERRITIN 114  TIBC 207*  IRON 13*  RETICCTPCT 1.5   Microbiology Recent Results (from the past 240 hour(s))  SARS Coronavirus 2 by RT PCR (hospital order, performed in [facility] hospital lab) Nasopharyngeal Nasopharyngeal Swab     Status: None   Collection Time: 09/25/19  6:20 PM   Specimen: Nasopharyngeal Swab  Result Value Ref [place] Status   SARS Coronavirus 2 NEGATIVE NEGATIVE Final    Comment: (NOTE) SARS-CoV-2 target nucleic acids are NOT DETECTED.  The SARS-CoV-2 RNA is generally detectable in upper and lower respiratory  specimens during the acute phase of infection. The lowest concentration of SARS-CoV-2 viral copies this assay can detect is 250 copies / mL. A negative result does not preclude SARS-CoV-2 infection and should not be used as the sole basis for treatment or other patient management decisions.  A negative result may occur with improper specimen collection / handling, submission of specimen other than nasopharyngeal swab, presence of viral mutation(s) within the areas targeted by this assay, and inadequate number of viral copies (<250 copies / mL). A negative result must be combined with clinical observations, patient history, and epidemiological information.  Fact Sheet for Patients:   StrictlyIdeas.no  Fact Sheet for Healthcare Providers: BankingDealers.co.za  This test is not yet approved or  cleared by the [place] FDA and has been authorized for detection and/or diagnosis of SARS-CoV-2 by FDA under an Emergency Use Authorization (EUA).  This EUA will remain in effect (meaning this test can be used) for the duration of the COVID-19 declaration under Section 564(b)(1) of the Act, 21  U.S.C. section 360bbb-3(b)(1), unless the authorization is terminated or revoked sooner.  Performed at [facility], [address], [place], [place] [address]   Surgical pcr screen     Status: None   Collection Time: 09/26/19  4:22 AM   Specimen: Nasal Mucosa; Nasal Swab  Result Value Ref [place] Status   MRSA, PCR NEGATIVE NEGATIVE Final   Staphylococcus aureus NEGATIVE NEGATIVE Final    Comment: (NOTE) The Xpert SA Assay (FDA approved for NASAL specimens in patients 49 years of age and older), is one component of a comprehensive surveillance program. It is not intended to diagnose infection nor to guide or monitor treatment. Performed at [facility], [place] [address] [place], [place] [address]      Discharge Instructions:   Discharge Instructions    Diet Carb Modified   Complete by: As directed    Discharge wound care:   Complete by: As directed    Apply santyl to left heel, cover with gauze, secure with kerlex.   Increase activity slowly   Complete by: As directed      Allergies as of 09/30/2019      Reactions   Fluconazole Other (See Comments)   Drug-induced hepatitis      Medication List    STOP taking these medications   meloxicam 7.5 MG tablet Commonly known as: MOBIC   Santyl ointment Generic drug: collagenase     TAKE these medications   (feeding supplement) PROSource Plus liquid Take 30 mLs by mouth 2 (two) times daily with a meal.   nutrition supplement (JUVEN) Pack Take 1 packet by mouth 2 (two) times daily between meals.   acetaminophen 500 MG tablet Commonly known as: TYLENOL Take 1,000 mg by mouth every 6 (six) hours as needed for mild pain or moderate pain.   ALPRAZolam 0.5 MG tablet Commonly known as: XANAX Take 1 tablet (0.5 mg total) by mouth 2 (two) times daily as needed for anxiety. What changed:   when to take this  reasons to take this   amitriptyline 10 MG tablet Commonly known as: ELAVIL Take 20 mg by mouth at  bedtime.   amoxicillin-clavulanate 875-125 MG tablet Commonly known as: AUGMENTIN TAKE 1 TABLET BY MOUTH TWICE DAILY   aspirin EC 81 MG tablet Take 81 mg by mouth daily with breakfast. Swallow whole.   clopidogrel 75 MG tablet Commonly known as: Plavix Take 1 tablet (75 mg total) by mouth daily. What changed:  when to take this   colchicine 0.6 MG tablet Take 0.6 mg by mouth daily as needed (gout attacks).   doxycycline 100 MG tablet Commonly known as: VIBRA-TABS Take 1 tablet (100 mg total) by mouth 2 (two) times daily.   ferrous sulfate 325 (65 FE) MG tablet Take 1 tablet (325 mg total) by mouth 2 (two) times daily with a meal. What changed: when to take this   furosemide 20 MG tablet Commonly known as: LASIX Take 1 tablet (20 mg total) by mouth daily with breakfast. Start taking on: October 02, 2019 What changed: These instructions start on October 02, 2019. If you are unsure what to do until then, ask your doctor or other care provider.   glimepiride 4 MG tablet Commonly known as: AMARYL Take 4 mg by mouth daily with breakfast.   insulin aspart 100 UNIT/ML injection Commonly known as: novoLOG Inject 0-15 Units into the skin 3 (three) times daily with meals. Sliding scale insulin Less than 70 initiate hypoglycemia protocol 70-120  0 units 120-150 2 unit 151-200 3 units 201-250 3 units 251-300 5 units 301-350 8 units 351-400 11 units  Greater than 400 15 units , call MD What changed:   how much to take  when to take this  additional instructions   levothyroxine 100 MCG tablet Commonly known as: SYNTHROID Take 100 mcg by mouth daily with breakfast.   multivitamin with minerals Tabs tablet Take 1 tablet by mouth daily.   nystatin powder Commonly known as: MYCOSTATIN/NYSTOP Apply topically 4 (four) times daily. What changed:   how much to take  when to take this  reasons to take this   oxyCODONE 5 MG immediate release tablet Commonly known as: Oxy  IR/ROXICODONE Take 1 tablet (5 mg total) by mouth every 4 (four) hours as needed for moderate pain.   pantoprazole 40 MG tablet Commonly known as: PROTONIX Take 1 tablet (40 mg total) by mouth daily.   pravastatin 10 MG tablet Commonly known as: PRAVACHOL Take 10 mg by mouth daily with supper.   primidone 50 MG tablet Commonly known as: MYSOLINE Take 150 mg by mouth at bedtime.   senna-docusate 8.6-50 MG tablet Commonly known as: Senokot-S Take 1 tablet by mouth 2 (two) times daily.   silver sulfADIAZINE 1 % cream Commonly known as: SILVADENE Apply topically 2 (two) times daily.   [place] 100-33 UNT-MCG/ML Sopn Generic drug: Insulin Glargine-Lixisenatide Inject 40 Units into the skin daily after breakfast.            Discharge Care Instructions  (From admission, onward)         Start     Ordered   09/30/19 0000  Discharge wound care:       Comments: Apply santyl to left heel, cover with gauze, secure with kerlex.   09/30/19 1014          Follow-up Information    Marty Heck, MD Follow up in 3 week(s).   Specialty: Vascular Surgery Why: the office will contact the patient for appointment Contact information: [place] [address] [phone]                Time coordinating discharge: 35 min Signed:  Geradine Girt DO  [facility] 09/30/2019, 10:14 AM

## 2019-10-01 LAB — GLUCOSE, CAPILLARY
Glucose-Capillary: 296 mg/dL — ABNORMAL HIGH (ref 70–99)
Glucose-Capillary: 208 mg/dL — ABNORMAL HIGH (ref 70–99)
Glucose-Capillary: 335 mg/dL — ABNORMAL HIGH (ref 70–99)
Glucose-Capillary: 267 mg/dL — ABNORMAL HIGH (ref 70–99)
Glucose-Capillary: 166 mg/dL — ABNORMAL HIGH (ref 70–99)

## 2019-10-01 LAB — SARS CORONAVIRUS 2 BY RT PCR (HOSPITAL ORDER, PERFORMED IN ~~LOC~~ HOSPITAL LAB): SARS Coronavirus 2: NEGATIVE

## 2019-10-01 MED ORDER — POLYETHYLENE GLYCOL 3350 17 GM/SCOOP PO POWD
1.0000 | Freq: Once | ORAL | Status: DC
  Filled 2019-10-01 (×2): qty 255

## 2019-10-01 MED ORDER — CHLORHEXIDINE GLUCONATE CLOTH 2 % EX PADS
6.0000 | MEDICATED_PAD | Freq: Every day | CUTANEOUS | Status: DC
  Administered 2019-10-01 – 2019-10-02 (×2): 6 via TOPICAL

## 2019-10-01 MED ORDER — BISACODYL 10 MG RE SUPP
10.0000 mg | Freq: Every day | RECTAL | Status: DC | PRN
  Administered 2019-10-02: 10 mg via RECTAL
  Filled 2019-10-01: qty 1

## 2019-10-01 MED ORDER — GLIMEPIRIDE 4 MG PO TABS
4.0000 mg | ORAL_TABLET | Freq: Every day | ORAL | Status: DC
  Administered 2019-10-01 – 2019-10-02 (×2): 4 mg via ORAL
  Filled 2019-10-01 (×2): qty 1

## 2019-10-01 MED ORDER — INSULIN GLARGINE 100 UNIT/ML ~~LOC~~ SOLN
40.0000 [IU] | Freq: Every day | SUBCUTANEOUS | Status: DC
  Administered 2019-10-02: 40 [IU] via SUBCUTANEOUS
  Filled 2019-10-01: qty 0.4

## 2019-10-01 NOTE — Plan of Care (Signed)

## 2019-10-01 NOTE — Progress Notes (Signed)
Orthopedic Tech Progress Note Patient Details:  Anthony Dunlap 1933/12/14 [id number] Called order into [facility] for retention sock. Patient ID: Anthony Dunlap, male   DOB: Jan 19, 1934, 86 y.o.   MRN: [id number]   [name] 10/01/2019, 8:05 AM

## 2019-10-01 NOTE — Progress Notes (Signed)
[facility]  Subjective  - No new complaints.     Objective 125/80 97 98.6 F (37 C) (Oral) 15 96%  Intake/Output Summary (Last 24 hours) at 10/01/2019 0746 Last data filed at 10/01/2019 0500 Gross per 24 hour  Intake --  Output 1050 ml  Net -1050 ml    Right BKA healing well will order retention sock from [place] today.  Right BKA without signs of ischemia appears viable.  Left heel protector in place, palpable DP pulse, dressing clean and dry. Lungs non labored breathing  Assessment/Planning: 86 y.o. male is s/p  Right below knee amputation 3 Days Post-Op. Doing well post op. Pain well controlled. Right BKA healing well, intact. Left heel continue to keep protected and float. continue Prevalon boot. Remains at high risk for limb loss. Pending d/c to SNF  Will order retention sock for right BKA. F/U in office for staple removal and left heel check in 3 weeks.  Roxy Horseman 10/01/2019 7:46 AM --  Laboratory Lab Results: Recent Labs    09/29/19 0312 09/30/19 0450  WBC 12.1* 10.0  HGB 7.1* 7.4*  HCT 23.4* 23.9*  PLT 346 309   BMET Recent Labs    09/29/19 0312 09/30/19 0450  NA 135 133*  K 4.1 4.2  CL 102 99  CO2 23 24  GLUCOSE 149* 180*  BUN 27* 24*  CREATININE 1.12 1.03  CALCIUM 8.6* 8.9    COAG Lab Results  Component Value Date   INR 1.1 09/28/2019   INR 1.0 09/12/2018   INR 1.0 09/11/2018   No results found for: PTT

## 2019-10-01 NOTE — Progress Notes (Addendum)
Patient awaiting discharge to SNF.  No overnight events.  Please see discharge summary from 8/11.  Rapid COVID ordered Eulogio Bear DO

## 2019-10-01 NOTE — Progress Notes (Signed)
Nutrition Follow-up  RD working remotely.  DOCUMENTATION CODES:   Not applicable  INTERVENTION:   -Continue MVI with minerals daily -Continue 30 ml Prosource Plus BID, each supplement provides 100 kcals and 15 grams protein -Continue 1 packet Juven BID, each packet provides 95 calories, 2.5 grams of protein (collagen), and 9.8 grams of carbohydrate (3 grams sugar); also contains 7 grams of L-arginine and L-glutamine, 300 mg vitamin C, 15 mg vitamin E, 1.2 mcg vitamin B-12, 9.5 mg zinc, 200 mg calcium, and 1.5 g  Calcium Beta-hydroxy-Beta-methylbutyrate to support wound healing  NUTRITION DIAGNOSIS:   Increased nutrient needs related to wound healing as evidenced by estimated needs.  Ongoing  GOAL:   Patient will meet greater than or equal to 90% of their needs  Progressing   MONITOR:   PO intake, Supplement acceptance, Labs, Weight trends, I & O's, Skin  REASON FOR ASSESSMENT:   Consult Wound healing  ASSESSMENT:   86 y.o. male, past medical history of diabetes mellitus, prostate cancer, aortic angioplasty, peripheral vascular disease following with [name], history of pelvic/right hip osteomyelitis on chronic antibiotic suppressive therapy for him by Dr. Dede Query presents to ED secondary to bleeding, and nonhealing heel ulcers, patient has bilateral heel ulcers, but report he has been having trouble with the right heel ulcer, which has been bleeding. Admitted for acute osteomyelitis of right heel.  8/9- s/p PROCEDURE: Right below-knee amputation  Reviewed I/O's: -1.1 L x 24 hours and -5.3 L since admission  UOP: 1.1 L x 24 hours  Attempted to speak with pt via call to hospital room phone, however, no answer.   Pt with variable intake; noted PO 5-75%. Pt is taking Prosource Plus and Juven supplements.   Per TOC notes, pt is medically stable for discharge and awaiting SNF placement.   Labs reviewed: CBGS: 208-353 (inpatient orders for glycemic control are 4  mg glimperide daily, 0-5 units insulin aspart daily at bedtime, 3 units inuslin aspart TID with meals, and 40 units insulin glargine daily).   Diet Order:   Diet Order            Diet Carb Modified           Diet Carb Modified Fluid consistency: Thin; Room service appropriate? Yes with Assist  Diet effective now                 EDUCATION NEEDS:   No education needs have been identified at this time  Skin:  Skin Assessment: Skin Integrity Issues: Skin Integrity Issues:: Diabetic Ulcer, Other (Comment) Diabetic Ulcer: lt heel Other: s/p rt BKA  Last BM:  PTA  Height:   Ht Readings from Last 1 Encounters:  09/25/19 5\' 8"  (1.727 m)    Weight:   Wt Readings from Last 1 Encounters:  09/25/19 78 kg    Ideal Body Weight:  65.5 kg (adjusted for rt BKA)  BMI:  Body mass index is 26.15 kg/m.  Estimated Nutritional Needs:   Kcal:  1850-2050  Protein:  90-100g  Fluid:  2L/day    Loistine Chance, RD, LDN, [place] Registered Dietitian II Certified Diabetes Care and Education Specialist Please refer to [facility] for RD and/or RD on-call/weekend/after hours pager

## 2019-10-02 LAB — GLUCOSE, CAPILLARY: Glucose-Capillary: 109 mg/dL — ABNORMAL HIGH (ref 70–99)

## 2019-10-02 NOTE — TOC Transition Note (Signed)
Transition of Care [facility]) - CM/SW Discharge Note   Patient Details  Name: Anthony Dunlap MRN: [id number] Date of Birth: 1933-08-12  [facility] CM/SW Contact:  Curlene Labrum, RN Phone Number: 10/02/2019, 10:05 AM   Clinical Narrative:    Case management spoke with [facility] and received insurance authorization for the patient for [place] approval # [id number], ref # [id number] for 5 days and review is due on 8/16 with Starlyn Skeans - fax # [phone].  I called and spoke with the patient's daughter - Anthony Dunlap and her husband and notified them that the patient will be able to transfer to the Rehab facility today.  Sharyn Lull , RN is to call report to [place] at [phone] [facility]     Barriers to Discharge: Continued Medical Work up, Ship broker   Patient Goals and CMS Choice Patient states their goals for this hospitalization and ongoing recovery are:: for him to get care and therapy if he cant go to inpatient rehab CMS Medicare.gov Compare Post Acute Care list provided to:: Patient Represenative (must comment) (pt daughter Anthony Dunlap) Choice offered to / list presented to : Patient, Adult Children  Discharge Placement                       Discharge Plan and Services In-house Referral: Clinical Social Work Discharge Planning Services: CM Consult Post Acute Care Choice: Skilled Nursing Facility                               Social Determinants of Health (SDOH) Interventions     Readmission Risk Interventions Readmission Risk Prevention Plan 09/29/2019 05/09/2018  Transportation Screening Complete Complete  PCP or Specialist Appt within 5-7 Days Not Complete -  Not Complete comments plan for SNF -  PCP or Specialist Appt within 3-5 Days - Complete  Home Care Screening Complete -  Medication Review (RN CM) Referral to [place] or [place] - Complete  Social Work Consult for  [place] Planning/Counseling - Complete  Palliative Care Screening - Not Applicable  Medication Review Press photographer) - Complete  Some recent data might be hidden

## 2019-10-02 NOTE — Plan of Care (Signed)
  Problem: Health Behavior/Discharge Planning: Goal: Ability to manage health-related needs will improve Outcome: Adequate for Discharge   Problem: Pain Managment: Goal: General experience of comfort will improve Outcome: Adequate for Discharge

## 2019-10-02 NOTE — Progress Notes (Signed)
Report called and given to Roxanne at [facility]. All questions answered. Pt belongings gathered to be sent with him. [facility] to transport.

## 2019-10-02 NOTE — Discharge Summary (Signed)
Physician Discharge Summary  Anthony Dunlap NUU:[id number] DOB: Mar 26, 1933 DOA: 09/25/2019  PCP: [name], MD  Admit date: 09/25/2019 Discharge date: 10/02/2019   Discharge disposition: snf   Recommendations for Outpatient Follow-Up:   staples in right BKA staying for 3 weeks. -large heel ulcer and high risk for limb loss.  Will need to float his heel and take extra good care of this. 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  Diet recommendation: Low sodium, heart healthy.  Carbohydrate-modified  Wound care: Apply silvadene to left heel, cover with gauze, secure with kerlex. Perform twice daily.  Place foot in Prevalon boot.  Code status: Full.   History of Present Illness:   Anthony Dunlap  is a 86 y.o. male, past medical history of diabetes mellitus, prostate cancer, aortic angioplasty, peripheral vascular disease following with [name], history of pelvic/right hip osteomyelitis on chronic antibiotic suppressive therapy for him by [name], Patient presents to ED secondary to bleeding, and nonhealing heel ulcers, patient has bilateral heel ulcers, but report he has been having trouble with the right heel ulcer, which has been bleeding, report he has a chronic wound, due to severe peripheral vascular disease, reports he is having his wound for several month, and had a home health aide that addresses it, reports some bleeding this morning, he denies any injury, any associated pains, no fever, no chills, no nausea or vomiting, no new tingling or numbness, and followed closely with vascular surgery, he had balloon angioplasty of left lower extremity, and there is no  revascularization option of the right lower extremity where he was offered BKA, for which by then he did not agree to initially, now in ED he says he will consider it gets indicated by vascular surgery.  - in ED initial x-ray of bilateral feet, showing suspicion of osteomyelitis of right heel, so MRI right foot was obtained which confirmed osteomyelitis, no leukocytosis, no fever, patient was started empirically on Rocephin, Flagyl and vancomycin, [facility] hospitalist consulted to admit.   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 -Followed by infectious disease Dr. Lucianne Lei dam as outpatient on suppressive chronic antibiotic therapy including Augmentin and doxycycline-- resume  Constipation -bowel regimen   Medical Consultants:    vascular  Discharge Exam:   Vitals:   10/02/19 0418 10/02/19 0718  BP: (!) 146/76 (!) 146/80  Pulse: 86 95  Resp: 15 16  Temp: 97.9 F (36.6 C) 97.6 F (  36.4 C)  SpO2: 100% 99%   Vitals:   10/01/19 1557 10/01/19 1958 10/02/19 0418 10/02/19 0718  BP: 121/73 122/78 (!) 146/76 (!) 146/80  Pulse:  98 94 86 95  Resp: 16 17 15 16   Temp: 97.7 F (36.5 C) 98 F (36.7 C) 97.9 F (36.6 C) 97.6 F (36.4 C)  TempSrc: Oral Oral Oral Oral  SpO2: 100% 100% 100% 99%  Weight:      Height:        General exam: Appears calm and comfortable.   The results of significant diagnostics from this hospitalization (including imaging, microbiology, ancillary and laboratory) are listed below for reference.     Procedures and Diagnostic Studies:   MR FOOT RIGHT WO CONTRAST  Result Date: 09/25/2019 CLINICAL DATA:  Nonhealing heel wound.  Abnormal x-ray EXAM: MRI OF THE RIGHT HINDFOOT WITHOUT CONTRAST TECHNIQUE: Multiplanar, multisequence MR imaging of the right hindfoot was performed. No intravenous contrast was administered. COMPARISON:  X-ray 09/25/2019 FINDINGS: Technical note: Motion degraded exam. Bones/Joint/Cartilage There is cortical destruction involving the posterolateral cortex of the posterior calcaneus with extensive bone marrow edema and confluent low T1 signal compatible with acute osteomyelitis (series 7, images 7-10; series 9, images 17-21). Destructive changes involve the lateral most aspect of the Achilles insertion. The remaining visualized bony structures of the hindfoot remain intact. No fracture or dislocation. No subtalar or tibiotalar joint effusion. Ligaments The anterior and posterior tibiofibular ligaments are intact. The anterior and posterior talofibular ligaments are intact. Intact calcaneofibular ligament. Deltoid ligament and spring ligament complex intact. Muscles and Tendons There is tendinosis with interstitial tearing of the distal Achilles tendon, particularly along its lateral margin at site of osteomyelitis. No complete retracted tear. No retrocalcaneal bursal fluid. The peroneal tendons, posteromedial ankle tendons, and anterior ankle tendons appear intact. No tenosynovitis. Soft tissues Large soft tissue ulceration at the posterolateral aspect of the hindfoot extending to  the underlying bone of the posterior calcaneus. No organized fluid collection. There is surrounding soft tissue edema suggesting cellulitis. IMPRESSION: 1. Large soft tissue ulceration at the posterolateral aspect of the right hindfoot extending to the underlying bone of the posterior calcaneus. Acute osteomyelitis of the posterior calcaneus. 2. Tendinosis with partial-thickness tearing of the distal Achilles tendon, particularly along its lateral margin at site of calcaneal osteomyelitis. No complete retracted tear. Electronically Signed   By: Davina Poke D.O.   On: 09/25/2019 15:44   DG Foot Complete Left  Result Date: 09/25/2019 CLINICAL DATA:  Diabetic heel wound EXAM: LEFT FOOT - COMPLETE 3+ VIEW COMPARISON:  08/05/2019 FINDINGS: No soft tissue defect is seen at the hindfoot. There are bidirectional calcaneal enthesophytes. No bony erosive changes to suggest osteomyelitis. No acute fracture or dislocation. Similar degenerative findings compared to prior. Vascular calcifications. No soft tissue gas. IMPRESSION: No radiographic evidence of osteomyelitis of the left foot. Electronically Signed   By: Davina Poke D.O.   On: 09/25/2019 14:17   DG Foot Complete Right  Result Date: 09/25/2019 CLINICAL DATA:  Bilateral heel wounds.  Diabetes. EXAM: RIGHT FOOT COMPLETE - 3+ VIEW COMPARISON:  08/05/2019. FINDINGS: Soft tissue swelling. No radiopaque foreign body. Dystrophic soft tissue calcifications. Mild erosive changes of the posterior surface and plantar surface of the calcaneus cannot be excluded on today's exam. Osteomyelitis cannot be excluded. MRI can be obtained to further evaluate. Diffuse degenerative change. No evidence of fracture or dislocation. IMPRESSION: Soft tissue swelling. Mild erosive changes of the posterior and plantar surfaces of the calcaneus cannot be  excluded on today's exam. Osteomyelitis cannot be excluded. MRI can be obtained to further evaluate. Electronically Signed   By:  Marcello Moores  Register   On: 09/25/2019 14:18     Labs:   Basic Metabolic Panel: Recent Labs  Lab 09/26/19 0118 09/26/19 0118 09/27/19 0231 09/27/19 0231 09/28/19 0240 09/28/19 0240 09/29/19 0312 09/30/19 0450  NA 138  --  137  --  138  --  135 133*  K 3.8   < > 4.2   < > 3.7   < > 4.1 4.2  CL 103  --  103  --  104  --  102 99  CO2 22  --  24  --  23  --  23 24  GLUCOSE 122*  --  105*  --  67*  --  149* 180*  BUN 28*  --  28*  --  27*  --  27* 24*  CREATININE 1.18  --  0.95  --  0.98  --  1.12 1.03  CALCIUM 8.7*  --  9.0  --  8.9  --  8.6* 8.9   < > = values in this interval not displayed.   GFR Estimated Creatinine Clearance: 51.7 mL/min (by C-G formula based on SCr of 1.03 mg/dL). Liver Function Tests: No results for input(s): AST, ALT, ALKPHOS, BILITOT, PROT, ALBUMIN in the last 168 hours. No results for input(s): LIPASE, AMYLASE in the last 168 hours. No results for input(s): AMMONIA in the last 168 hours. Coagulation profile Recent Labs  Lab 09/28/19 0240  INR 1.1    CBC: Recent Labs  Lab 09/25/19 1549 09/25/19 1549 09/26/19 0118 09/27/19 0231 09/28/19 0240 09/29/19 0312 09/30/19 0450  WBC 10.9*   < > 8.7 7.7 8.4 12.1* 10.0  NEUTROABS 9.0*  --   --   --   --   --   --   HGB 10.1*   < > 8.9* 9.1* 8.8* 7.1* 7.4*  HCT 32.9*   < > 28.4* 29.6* 28.6* 23.4* 23.9*  MCV 86.4   < > 83.8 84.3 84.9 84.8 84.2  PLT 431*   < > 357 382 383 346 309   < > = values in this interval not displayed.   Cardiac Enzymes: No results for input(s): CKTOTAL, CKMB, CKMBINDEX, TROPONINI in the last 168 hours. BNP: Invalid input(s): POCBNP CBG: Recent Labs  Lab 10/01/19 0635 10/01/19 1141 10/01/19 1642 10/01/19 2058 10/02/19 0646  GLUCAP 208* 335* 267* 166* 109*   D-Dimer No results for input(s): DDIMER in the last 72 hours. Hgb A1c No results for input(s): HGBA1C in the last 72 hours. Lipid Profile No results for input(s): CHOL, HDL, LDLCALC, TRIG, CHOLHDL, LDLDIRECT in  the last 72 hours. Thyroid function studies No results for input(s): TSH, T4TOTAL, T3FREE, THYROIDAB in the last 72 hours.  Invalid input(s): FREET3 Anemia work up Recent Labs    09/29/19 1133  VITAMINB12 196  FOLATE 14.9  FERRITIN 114  TIBC 207*  IRON 13*  RETICCTPCT 1.5   Microbiology Recent Results (from the past 240 hour(s))  SARS Coronavirus 2 by RT PCR (hospital order, performed in [facility] hospital lab) Nasopharyngeal Nasopharyngeal Swab     Status: None   Collection Time: 09/25/19  6:20 PM   Specimen: Nasopharyngeal Swab  Result Value Ref Range Status   SARS Coronavirus 2 NEGATIVE NEGATIVE Final    Comment: (NOTE) SARS-CoV-2 target nucleic acids are NOT DETECTED.  The SARS-CoV-2 RNA is generally detectable in upper and lower  respiratory specimens during the acute phase of infection. The lowest concentration of SARS-CoV-2 viral copies this assay can detect is 250 copies / mL. A negative result does not preclude SARS-CoV-2 infection and should not be used as the sole basis for treatment or other patient management decisions.  A negative result may occur with improper specimen collection / handling, submission of specimen other than nasopharyngeal swab, presence of viral mutation(s) within the areas targeted by this assay, and inadequate number of viral copies (<250 copies / mL). A negative result must be combined with clinical observations, patient history, and epidemiological information.  Fact Sheet for Patients:   StrictlyIdeas.no  Fact Sheet for Healthcare Providers: BankingDealers.co.za  This test is not yet approved or  cleared by the [place] FDA and has been authorized for detection and/or diagnosis of SARS-CoV-2 by FDA under an Emergency Use Authorization (EUA).  This EUA will remain in effect (meaning this test can be used) for the duration of the COVID-19 declaration under Section 564(b)(1) of the  Act, 21 U.S.C. section 360bbb-3(b)(1), unless the authorization is terminated or revoked sooner.  Performed at [facility], [address], [place], [place] [address]   Surgical pcr screen     Status: None   Collection Time: 09/26/19  4:22 AM   Specimen: Nasal Mucosa; Nasal Swab  Result Value Ref Range Status   MRSA, PCR NEGATIVE NEGATIVE Final   Staphylococcus aureus NEGATIVE NEGATIVE Final    Comment: (NOTE) The Xpert SA Assay (FDA approved for NASAL specimens in patients 34 years of age and older), is one component of a comprehensive surveillance program. It is not intended to diagnose infection nor to guide or monitor treatment. Performed at [facility], [place] [address] [place], [place] [address]   SARS Coronavirus 2 by RT PCR (hospital order, performed in [facility] hospital lab) Nasopharyngeal Nasopharyngeal Swab     Status: None   Collection Time: 10/01/19 10:11 AM   Specimen: Nasopharyngeal Swab  Result Value Ref Range Status   SARS Coronavirus 2 NEGATIVE NEGATIVE Final    Comment: (NOTE) SARS-CoV-2 target nucleic acids are NOT DETECTED.  The SARS-CoV-2 RNA is generally detectable in upper and lower respiratory specimens during the acute phase of infection. The lowest concentration of SARS-CoV-2 viral copies this assay can detect is 250 copies / mL. A negative result does not preclude SARS-CoV-2 infection and should not be used as the sole basis for treatment or other patient management decisions.  A negative result may occur with improper specimen collection / handling, submission of specimen other than nasopharyngeal swab, presence of viral mutation(s) within the areas targeted by this assay, and inadequate number of viral copies (<250 copies / mL). A negative result must be combined with clinical observations, patient history, and epidemiological information.  Fact Sheet for Patients:   StrictlyIdeas.no  Fact Sheet for Healthcare  Providers: BankingDealers.co.za  This test is not yet approved or  cleared by the [place] FDA and has been authorized for detection and/or diagnosis of SARS-CoV-2 by FDA under an Emergency Use Authorization (EUA).  This EUA will remain in effect (meaning this test can be used) for the duration of the COVID-19 declaration under Section 564(b)(1) of the Act, 21 U.S.C. section 360bbb-3(b)(1), unless the authorization is terminated or revoked sooner.  Performed at [facility], [place] [address] [place], [place] [address]      Discharge Instructions:   Discharge Instructions    Diet Carb Modified   Complete  by: As directed    Discharge wound care:   Complete by: As directed    Apply santyl to left heel, cover with gauze, secure with kerlex.   Increase activity slowly   Complete by: As directed      Allergies as of 10/02/2019      Reactions   Fluconazole Other (See Comments)   Drug-induced hepatitis      Medication List    STOP taking these medications   meloxicam 7.5 MG tablet Commonly known as: MOBIC   Santyl ointment Generic drug: collagenase     TAKE these medications   (feeding supplement) PROSource Plus liquid Take 30 mLs by mouth 2 (two) times daily with a meal.   nutrition supplement (JUVEN) Pack Take 1 packet by mouth 2 (two) times daily between meals.   acetaminophen 500 MG tablet Commonly known as: TYLENOL Take 1,000 mg by mouth every 6 (six) hours as needed for mild pain or moderate pain.   ALPRAZolam 0.5 MG tablet Commonly known as: XANAX Take 1 tablet (0.5 mg total) by mouth 2 (two) times daily as needed for anxiety. What changed:   when to take this  reasons to take this   amitriptyline 10 MG tablet Commonly known as: ELAVIL Take 20 mg by mouth at bedtime.   amoxicillin-clavulanate 875-125 MG tablet Commonly known as: AUGMENTIN TAKE 1 TABLET BY MOUTH TWICE DAILY   aspirin EC 81 MG tablet Take 81 mg by  mouth daily with breakfast. Swallow whole.   clopidogrel 75 MG tablet Commonly known as: Plavix Take 1 tablet (75 mg total) by mouth daily. What changed: when to take this   colchicine 0.6 MG tablet Take 0.6 mg by mouth daily as needed (gout attacks).   doxycycline 100 MG tablet Commonly known as: VIBRA-TABS Take 1 tablet (100 mg total) by mouth 2 (two) times daily.   ferrous sulfate 325 (65 FE) MG tablet Take 1 tablet (325 mg total) by mouth 2 (two) times daily with a meal. What changed: when to take this   furosemide 20 MG tablet Commonly known as: LASIX Take 1 tablet (20 mg total) by mouth daily with breakfast.   glimepiride 4 MG tablet Commonly known as: AMARYL Take 4 mg by mouth daily with breakfast.   insulin aspart 100 UNIT/ML injection Commonly known as: novoLOG Inject 0-15 Units into the skin 3 (three) times daily with meals. Sliding scale insulin Less than 70 initiate hypoglycemia protocol 70-120  0 units 120-150 2 unit 151-200 3 units 201-250 3 units 251-300 5 units 301-350 8 units 351-400 11 units  Greater than 400 15 units , call MD What changed:   how much to take  when to take this  additional instructions   levothyroxine 100 MCG tablet Commonly known as: SYNTHROID Take 100 mcg by mouth daily with breakfast.   multivitamin with minerals Tabs tablet Take 1 tablet by mouth daily.   nystatin powder Commonly known as: MYCOSTATIN/NYSTOP Apply topically 4 (four) times daily. What changed:   how much to take  when to take this  reasons to take this   oxyCODONE 5 MG immediate release tablet Commonly known as: Oxy IR/ROXICODONE Take 1 tablet (5 mg total) by mouth every 4 (four) hours as needed for moderate pain.   pantoprazole 40 MG tablet Commonly known as: PROTONIX Take 1 tablet (40 mg total) by mouth daily.   pravastatin 10 MG tablet Commonly known as: PRAVACHOL Take 10 mg by mouth daily with supper.  primidone 50 MG  tablet Commonly known as: MYSOLINE Take 150 mg by mouth at bedtime.   senna-docusate 8.6-50 MG tablet Commonly known as: Senokot-S Take 1 tablet by mouth 2 (two) times daily.   silver sulfADIAZINE 1 % cream Commonly known as: SILVADENE Apply topically 2 (two) times daily.   Sol[place]a 100-33 UNT-MCG/ML Sopn Generic drug: Insulin Glargine-Lixisenatide Inject 40 Units into the skin daily after breakfast.            Discharge Care Instructions  (From admission, onward)         Start     Ordered   09/30/19 0000  Discharge wound care:       Comments: Apply santyl to left heel, cover with gauze, secure with kerlex.   09/30/19 1014          Follow-up Information    Marty Heck, MD Follow up in 3 week(s).   Specialty: Vascular Surgery Why: the office will contact the patient for appointment Contact information: [place] [address] [phone]                Time coordinating discharge: 35 min Signed:  Geradine Girt DO  [facility] 10/02/2019, 9:37 AM

## 2019-10-02 NOTE — Progress Notes (Addendum)
[facility]  Subjective  - No new complaints   Objective (!) 146/80 95 97.6 F (36.4 C) (Oral) 16 99%  Intake/Output Summary (Last 24 hours) at 10/02/2019 0956 Last data filed at 10/02/2019 0900 Gross per 24 hour  Intake 240 ml  Output 951 ml  Net -711 ml    Right BKA healing well will order retention sock from [facility] today.  Right BKA without signs of ischemia appears viable.  Left heel protector in place, palpable DP pulse, dressing clean and dry. Lungs non labored breathing  Assessment/Planning: 86 y.o.maleis s/p Right below knee amputation3 Days Post-Op. Doing well post op. Pain well controlled. Right BKA healing well, intact. Left heelcontinue to keep protected andfloat. continuePrevalon boot. Remains at high risk for limb loss. Pending d/c to SNF  F/U scheduled for staple removal in 3 weeks.  Roxy Horseman 10/02/2019 9:56 AM --  Laboratory Lab Results: Recent Labs    09/30/19 0450  WBC 10.0  HGB 7.4*  HCT 23.9*  PLT 309   BMET Recent Labs    09/30/19 0450  NA 133*  K 4.2  CL 99  CO2 24  GLUCOSE 180*  BUN 24*  CREATININE 1.03  CALCIUM 8.9    COAG Lab Results  Component Value Date   INR 1.1 09/28/2019   INR 1.0 09/12/2018   INR 1.0 09/11/2018   No results found for: PTT  I have seen and evaluated the patient. I agree with the PA note as documented above. Right BKA healing.  Left foot with palpable DP but large heel wound that will be difficult to heal as previously discussed.  F/U 3 weeks for staple removal.  Updated daughter earlier this week.  Marty Heck, MD [facility] Office: [phone]

## 2019-10-08 ENCOUNTER — Encounter (HOSPITAL_BASED_OUTPATIENT_CLINIC_OR_DEPARTMENT_OTHER): Payer: Medicare Other | Admitting: Internal Medicine

## 2019-10-20 ENCOUNTER — Other Ambulatory Visit: Payer: Self-pay

## 2019-10-20 ENCOUNTER — Encounter: Payer: Self-pay | Admitting: Physician Assistant

## 2019-10-20 ENCOUNTER — Ambulatory Visit: Payer: Medicare Other | Admitting: Physician Assistant

## 2019-10-20 ENCOUNTER — Other Ambulatory Visit: Payer: Self-pay | Admitting: Infectious Disease

## 2019-10-20 VITALS — BP 108/71 | HR 109 | Temp 98.4°F | Resp 20 | Ht 68.0 in | Wt 170.0 lb

## 2019-10-20 DIAGNOSIS — Z89511 Acquired absence of right leg below knee: Secondary | ICD-10-CM

## 2019-10-20 DIAGNOSIS — I779 Disorder of arteries and arterioles, unspecified: Secondary | ICD-10-CM

## 2019-10-20 NOTE — Progress Notes (Signed)
POST OPERATIVE OFFICE NOTE    CC:  F/u for surgery  HPI:  This is a 86 y.o. male who is s/p right below knee amputation on 09/28/19 by Dr. Donnetta Hutching due to gangrene of right foot. He had previously undergone Abdominal aortogram with attempts at right lower extremity revascularization but unfortunately he had no options. This was performed on 08/12/19 by [name]. He also had Abdominal aortogram with left lower extremity arteriogram wth left anterior tibial artery angioplasty with jade balloon on 08/19/19 by [name] for left heel wound. He was unable to successfully recanalize the left peroneal artery. He did have palpable left DP at completion of intervention. At time of discharge from hospital following his right BKA he continued to have left heel wound and remained at high risk for limb loss on the left as well.  He presents today for follow up wound check on left heel as well as right BKA staple removal. He denies any pain in either leg. Per patients Aide the left heel is improving. The right BKA site has not had any drainage or bleeding. He has been using stocking daily. Left heel has been redressed daily. He otherwise is continuing therapy. He is still at rehab facility but is hoping to continue to progress to go home. He denies any chest pain, shortness of breath, fever or chills. He remains on Augmentin for chronic osteomyelitis of Hip and Pelvis. He sees ID regularly for follow up  Allergies  Allergen Reactions  . Fluconazole Other (See Comments)    Drug-induced hepatitis    Current Outpatient Medications  Medication Sig Dispense Refill  . acetaminophen (TYLENOL) 500 MG tablet Take 1,000 mg by mouth every 6 (six) hours as needed for mild pain or moderate pain.    [name] ALPRAZolam (XANAX) 0.5 MG tablet Take 1 tablet (0.5 mg total) by mouth 2 (two) times daily as needed for anxiety. 4 tablet 0  . amitriptyline (ELAVIL) 10 MG tablet Take 20 mg by mouth at bedtime.     [name] amoxicillin-clavulanate  (AUGMENTIN) 875-125 MG tablet TAKE 1 TABLET BY MOUTH TWICE DAILY (Patient taking differently: Take 1 tablet by mouth 2 (two) times daily. ) 60 tablet 11  . aspirin EC 81 MG tablet Take 81 mg by mouth daily with breakfast. Swallow whole.     . clopidogrel (PLAVIX) 75 MG tablet Take 1 tablet (75 mg total) by mouth daily. (Patient taking differently: Take 75 mg by mouth daily with breakfast. ) 30 tablet 11  . colchicine 0.6 MG tablet Take 0.6 mg by mouth daily as needed (gout attacks).     [name] doxycycline (VIBRA-TABS) 100 MG tablet Take 1 tablet (100 mg total) by mouth 2 (two) times daily. 28 tablet 1  . ferrous sulfate 325 (65 FE) MG tablet Take 1 tablet (325 mg total) by mouth 2 (two) times daily with a meal.  3  . furosemide (LASIX) 20 MG tablet Take 1 tablet (20 mg total) by mouth daily with breakfast. 30 tablet   . glimepiride (AMARYL) 4 MG tablet Take 4 mg by mouth daily with breakfast.     . insulin aspart (NOVOLOG) 100 UNIT/ML injection Inject 0-15 Units into the skin 3 (three) times daily with meals. Sliding scale insulin Less than 70 initiate hypoglycemia protocol 70-120  0 units 120-150 2 unit 151-200 3 units 201-250 3 units 251-300 5 units 301-350 8 units 351-400 11 units  Greater than 400 15 units , call MD (Patient taking differently: Inject 0-16  Units into the skin See admin instructions. Inject 0-16 units subcutaneously three times daily with meals per sliding scale: CBG 70-120 0 units 120-150 4 units, 151-200 5 units, 201-250 6 units, 251-300 7 units, 301-350 10 units, 351-400 13 units, >400 16 units and call MD (Less than 70 initiate hypoglycemia protocol)) 10 mL 11  . Insulin Glargine-Lixisenatide (SOLIQUA) 100-33 UNT-MCG/ML SOPN Inject 40 Units into the skin daily after breakfast.    . levothyroxine (SYNTHROID, LEVOTHROID) 100 MCG tablet Take 100 mcg by mouth daily with breakfast.     . Multiple Vitamin (MULTIVITAMIN WITH MINERALS) TABS tablet Take 1 tablet by mouth daily.    .  nutrition supplement, JUVEN, (JUVEN) PACK Take 1 packet by mouth 2 (two) times daily between meals.  0  . Nutritional Supplements (,FEEDING SUPPLEMENT, PROSOURCE PLUS) liquid Take 30 mLs by mouth 2 (two) times daily with a meal.    . nystatin (MYCOSTATIN/NYSTOP) powder Apply topically 4 (four) times daily. (Patient taking differently: Apply 1 application topically daily as needed (yeast). ) 60 g 8  . oxyCODONE (OXY IR/ROXICODONE) 5 MG immediate release tablet Take 1 tablet (5 mg total) by mouth every 4 (four) hours as needed for moderate pain. 6 tablet 0  . pantoprazole (PROTONIX) 40 MG tablet Take 1 tablet (40 mg total) by mouth daily.    . pravastatin (PRAVACHOL) 10 MG tablet Take 10 mg by mouth daily with supper.     . primidone (MYSOLINE) 50 MG tablet Take 150 mg by mouth at bedtime.     . senna-docusate (SENOKOT-S) 8.6-50 MG tablet Take 1 tablet by mouth 2 (two) times daily.    . silver sulfADIAZINE (SILVADENE) 1 % cream Apply topically 2 (two) times daily. 50 g 0   No current facility-administered medications for this visit.     ROS:  See HPI  Physical Exam:  Vitals:   10/20/19 1025  BP: 108/71  Pulse: (!) 109  Resp: 20  Temp: 98.4 F (36.9 C)  TempSrc: Temporal  SpO2: 96%  Weight: 170 lb (77.1 kg)  Height: 5\' 8"  (1.727 m)    Incision:  Right BKA site with staples intact. Viable flaps. There is some necrosis of skin edge along central portion very minimally. I removed lateral and medial staples (#8) and left the central ones. Some concern this would dehisce if I removed them today Extremities:  2+ femoral pulses bilaterally, left popliteal and DP palpable. Left foot warm.  There is some slough in wound bed otherwise there is pink granulation tissue. There is no drainage. The wound edges appear to be healing and no signs of tunneling or infection Neuro: alert and oriented   Assessment/Plan:  This is a 86 y.o. male who is s/p right below knee amputation and follow up of left  heel wound s/p revascularization. Per patient and Aide the left heel wound is improving. He is having daily dressing changes with silvadene dressing at [facility] rehab facility. His aide is planning to get him back into wound care. Her has palpable DP pulse in left foot. I anticipate that with continued wound care and continuing to keep heel protected he has chance of healing this with time. His right BKA site is intact and healing well. Half of staples removed today. I was concerned about central portion dehiscing if I took staples out today. Will bring him back in a week to take remainder of staples out   Karoline Caldwell, PA-C [facility] [phone]  Clinic MD:  [name]

## 2019-10-21 ENCOUNTER — Telehealth: Payer: Self-pay

## 2019-10-21 NOTE — Telephone Encounter (Signed)
Received refill request for Doxycyline. Will forward message to MD to advise if okay to refill.  Roberts

## 2019-10-21 NOTE — Telephone Encounter (Signed)
Please advise on refill.

## 2019-10-21 NOTE — Telephone Encounter (Signed)
I have not been rx doxy

## 2019-10-27 ENCOUNTER — Other Ambulatory Visit: Payer: Self-pay

## 2019-10-27 ENCOUNTER — Ambulatory Visit: Payer: Medicare Other | Admitting: Physician Assistant

## 2019-10-27 VITALS — BP 109/73 | HR 114 | Temp 98.5°F | Resp 20 | Ht 68.0 in

## 2019-10-27 DIAGNOSIS — Z89511 Acquired absence of right leg below knee: Secondary | ICD-10-CM

## 2019-10-27 DIAGNOSIS — S91302A Unspecified open wound, left foot, initial encounter: Secondary | ICD-10-CM

## 2019-10-27 NOTE — Progress Notes (Signed)
POST OPERATIVE OFFICE NOTE    CC:  F/u for surgery  HPI:  This is a 86 y.o. male who is s/p right BKA on 09/28/2019 by Dr. Donnetta Hutching.   He had previously undergone an angiogram with LLE runoff and had widely patent LLE arterial system with AT.  No flow observed in the PT or peroneal artery.  On 09/22/2019, his ABI was 1.15 with triphasic waveforms and great toe pressure was 160.    He was seen on 10/20/2019 and at that time, his left heel was improving and was undergoing daily dressing chagnes with silvadene at [facility] rehab facility.  His BKA on the right was intact and healing and a portion of staples were removed.  She was concerned about central portion of dehiscence and there fore, some staples were left intact.    Pt returns today for follow up.  He denies any fever or chills.  He states he does not have pain in his left foot.  His caregiver states they are still using Silvadene for dressing on the left heel wound.  They feel the wound is getting better.  Caregiver feels pt is sitting up in his wheelchair all day and not elevating his left leg as he does have some swelling in that foot today.  They are protecting his foot with a boot and floating heels at night.   The pt's wife died and her funeral was yesterday.    Allergies  Allergen Reactions  . Fluconazole Other (See Comments)    Drug-induced hepatitis    Current Outpatient Medications  Medication Sig Dispense Refill  . acetaminophen (TYLENOL) 500 MG tablet Take 1,000 mg by mouth every 6 (six) hours as needed for mild pain or moderate pain.    [name] ALPRAZolam (XANAX) 0.5 MG tablet Take 1 tablet (0.5 mg total) by mouth 2 (two) times daily as needed for anxiety. 4 tablet 0  . amitriptyline (ELAVIL) 10 MG tablet Take 20 mg by mouth at bedtime.     [name] amoxicillin-clavulanate (AUGMENTIN) 875-125 MG tablet TAKE 1 TABLET BY MOUTH TWICE DAILY (Patient taking differently: Take 1 tablet by mouth 2 (two) times daily. ) 60 tablet 11  . aspirin  EC 81 MG tablet Take 81 mg by mouth daily with breakfast. Swallow whole.     . clopidogrel (PLAVIX) 75 MG tablet Take 1 tablet (75 mg total) by mouth daily. (Patient taking differently: Take 75 mg by mouth daily with breakfast. ) 30 tablet 11  . colchicine 0.6 MG tablet Take 0.6 mg by mouth daily as needed (gout attacks).     [name] doxycycline (VIBRA-TABS) 100 MG tablet Take 1 tablet (100 mg total) by mouth 2 (two) times daily. 28 tablet 1  . ferrous sulfate 325 (65 FE) MG tablet Take 1 tablet (325 mg total) by mouth 2 (two) times daily with a meal.  3  . furosemide (LASIX) 20 MG tablet Take 1 tablet (20 mg total) by mouth daily with breakfast. 30 tablet   . glimepiride (AMARYL) 4 MG tablet Take 4 mg by mouth daily with breakfast.     . insulin aspart (NOVOLOG) 100 UNIT/ML injection Inject 0-15 Units into the skin 3 (three) times daily with meals. Sliding scale insulin Less than 70 initiate hypoglycemia protocol 70-120  0 units 120-150 2 unit 151-200 3 units 201-250 3 units 251-300 5 units 301-350 8 units 351-400 11 units  Greater than 400 15 units , call MD (Patient taking differently: Inject 0-16 Units into the  skin See admin instructions. Inject 0-16 units subcutaneously three times daily with meals per sliding scale: CBG 70-120 0 units 120-150 4 units, 151-200 5 units, 201-250 6 units, 251-300 7 units, 301-350 10 units, 351-400 13 units, >400 16 units and call MD (Less than 70 initiate hypoglycemia protocol)) 10 mL 11  . Insulin Glargine-Lixisenatide (SOLIQUA) 100-33 UNT-MCG/ML SOPN Inject 40 Units into the skin daily after breakfast.    . levothyroxine (SYNTHROID, LEVOTHROID) 100 MCG tablet Take 100 mcg by mouth daily with breakfast.     . Multiple Vitamin (MULTIVITAMIN WITH MINERALS) TABS tablet Take 1 tablet by mouth daily.    . nutrition supplement, JUVEN, (JUVEN) PACK Take 1 packet by mouth 2 (two) times daily between meals.  0  . Nutritional Supplements (,FEEDING SUPPLEMENT, PROSOURCE  PLUS) liquid Take 30 mLs by mouth 2 (two) times daily with a meal.    . nystatin (MYCOSTATIN/NYSTOP) powder Apply topically 4 (four) times daily. (Patient taking differently: Apply 1 application topically daily as needed (yeast). ) 60 g 8  . oxyCODONE (OXY IR/ROXICODONE) 5 MG immediate release tablet Take 1 tablet (5 mg total) by mouth every 4 (four) hours as needed for moderate pain. 6 tablet 0  . pantoprazole (PROTONIX) 40 MG tablet Take 1 tablet (40 mg total) by mouth daily.    . pravastatin (PRAVACHOL) 10 MG tablet Take 10 mg by mouth daily with supper.     . primidone (MYSOLINE) 50 MG tablet Take 150 mg by mouth at bedtime.     . senna-docusate (SENOKOT-S) 8.6-50 MG tablet Take 1 tablet by mouth 2 (two) times daily.    . silver sulfADIAZINE (SILVADENE) 1 % cream Apply topically 2 (two) times daily. 50 g 0   No current facility-administered medications for this visit.     ROS:  See HPI  Physical Exam:  Today's Vitals   10/27/19 1004  BP: 109/73  Pulse: (!) 114  Resp: 20  Temp: 98.5 F (36.9 C)  TempSrc: Temporal  SpO2: 99%  Height: 5\' 8"  (1.727 m)   Body mass index is 25.85 kg/m.   Incision:      Extremities:  1-2+ pitting edema left foot and leg;  Left DP is faintly palpable; brisk DP/AT doppler signal present.     Assessment/Plan:  This is a 86 y.o. male who is s/p: Right BKA on 09/28/2019 by Dr. Donnetta Hutching and also has non healing left heel wound present  -pt had arteriogram in June with left ATA angioplasty and follow up studies in early August revealed triphasic waveforms with ABI of 1.15 and TBI of 0.96 and arterial duplex that day revealed widely patent LLE arterial system with ATA flow.  -he has faintly palpable left DP pulse in light of edema-brisk doppler signal present left DP/AT. -pt's right BKA healing-will remove the rest of the staples today.   -left heel wound slow healing; discussed with [name] and will discontinue silvadene and start using Santyl to  help debride wound.  Discussed with pt and his caregiver that he is still at risk for amputation and if this does heal, it will be a slow process.  Also okay for 5-10 minute daily luke warm dial soap soaks prior to dressing changes.   -he does have some pitting edema in the LLE.  Recommend elevating leg to help with swelling.  He has been out and up and around more this weekend for his wife's funeral.  Doubt DVT as his left calf is very soft and  non tender.  -continue plavix/asa -pt will f/u with [name] in 4 weeks.     Leontine Locket, [facility] [phone]  Clinic MD:  [name]

## 2019-10-28 ENCOUNTER — Encounter: Payer: Self-pay | Admitting: Infectious Disease

## 2019-10-28 ENCOUNTER — Ambulatory Visit: Payer: Medicare Other | Admitting: Infectious Disease

## 2019-10-28 VITALS — BP 110/72 | HR 113 | Temp 97.4°F

## 2019-10-28 DIAGNOSIS — M86659 Other chronic osteomyelitis, unspecified thigh: Secondary | ICD-10-CM

## 2019-10-28 DIAGNOSIS — M86159 Other acute osteomyelitis, unspecified femur: Secondary | ICD-10-CM | POA: Diagnosis not present

## 2019-10-28 DIAGNOSIS — T50905A Adverse effect of unspecified drugs, medicaments and biological substances, initial encounter: Secondary | ICD-10-CM

## 2019-10-28 DIAGNOSIS — I70229 Atherosclerosis of native arteries of extremities with rest pain, unspecified extremity: Secondary | ICD-10-CM

## 2019-10-28 DIAGNOSIS — K716 Toxic liver disease with hepatitis, not elsewhere classified: Secondary | ICD-10-CM

## 2019-10-28 DIAGNOSIS — B952 Enterococcus as the cause of diseases classified elsewhere: Secondary | ICD-10-CM

## 2019-10-28 DIAGNOSIS — A498 Other bacterial infections of unspecified site: Secondary | ICD-10-CM

## 2019-10-28 DIAGNOSIS — L97421 Non-pressure chronic ulcer of left heel and midfoot limited to breakdown of skin: Secondary | ICD-10-CM

## 2019-10-28 HISTORY — DX: Non-pressure chronic ulcer of left heel and midfoot limited to breakdown of skin: L97.421

## 2019-10-28 NOTE — Progress Notes (Signed)
Subjective:  Follow-up for multiple infections   Patient ID: Anthony Dunlap, male    DOB: [birth year], 86 y.o.   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 He is currently residing in a skilled nursing facility and on Zosyn rather than Unasyn. I do not know why this change was made as his microbes were covered by the Unasyn. In any case the abscesses have resolved radiographically and the drains been pulled by interventional radiology.  He did well since I last saw him in March 2020, but then was admitted to the hospital with severe candidal infection with intertrigo, and diarrhea with the catheter in place and candidemia.  He was treated with fluconazole but then developed hepatitis and was changed over to Eraxis and completed a two-week course.     when I conducted a telephone visit with him on June 15 of 2020 he was experiencing worsening right-sided hip pain with ambulation and with times different positions in his bed.  I saw him in person in late June and his inflammatory markers were elevated I then ordered an MRI on July 16th showed:  IMPRESSION: Osteomyelitis about the symphysis pubis demonstrates some improvement, particularly in the superior pubic rami. There has been only minimal improvement in signal abnormality in the inferior pubic rami.  Tiny fluid collection in the symphysis pubis is new since the prior examination worrisome for abscess.  He had no change in severe right and moderately severe left hip osteoarthritis.  I I arranged for him to be  admitted to the hospital directly from clinic last visit.  Interventional radiology able to aspirate the pubic symphysis with 1 mL of bloody fluid being recovered for culture but no organisms grew.  He is placed back on intravenous Unasyn.  Unfortunately taking the Unasyn at home became too burdensome for the son because of it being multiple times per day.  He was  switched over to IV daptomycin x 2 weeks then with addition of ceftriaxone 2 g daily and metronidazole 500 mg 3 times daily to cover gram negatives and anaerobes.  Since then he was switched over to Augmentin and continued on that.  He continued to have some pain in his pubic region but also more so in the right hip where he has had severe osteoarthritis.  We got an MRI of the hip and pelvis in December 2020 which showed:   IMPRESSION: 1. Overall improved appearance of the pubic bone marrow edema and enhancement. No evidence of progressive osteomyelitis. 2. Stable small peripherally enhancing fluid collection at the symphysis pubis. 3. No new or enlarging fluid collections. The soft tissue inflammatory changes in the inferior pelvis have improved. 4. Stable severe right hip osteoarthritis.   He has been seen by Joni Fears with orthopedic surgery  He  has had successful right hip THA and felt dramatically better.   Unfortunately he has developed critical limb ischemia bilateral lower extremities, sp angioplasty to let anttioer tibial artery by [facility] on June 30th.. Right leg has no options for revascularization. He was offered BKA to solve this problem but he was snot  ready  For that.  He continued on augmentin.  He had some worsening of wound and I was called on a weekend and added doxycycline.  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  He also has ulcer on opposite heel which is in danger of progressing.  He is residing in  SNF.   Past Medical History:  Diagnosis Date  . 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 Diabetes ([place])   . Drug-induced hepatitis 06/05/2018  . Inguinal hernia    06/09/2019: per patient " a long time ago on the right side"  . Left eye pain   . Localized osteoarthrosis of right hip 01/05/2019  . Prostate cancer [facility])     Past Surgical History:  Procedure Laterality Date  . ABDOMINAL AORTOGRAM W/LOWER EXTREMITY Bilateral 08/12/2019   Procedure: ABDOMINAL AORTOGRAM W/LOWER EXTREMITY;  Surgeon: Marty Heck, MD;  Location: [place] [facility];  Service: Cardiovascular;  Laterality: Bilateral;  . ABDOMINAL AORTOGRAM W/LOWER EXTREMITY Left 08/19/2019   Procedure: ABDOMINAL AORTOGRAM W/LOWER EXTREMITY;  Surgeon: Marty Heck, MD;  Location: [place] [facility];  Service: Cardiovascular;  Laterality: Left;  . AMPUTATION Right 09/28/2019   Procedure: AMPUTATION BELOW KNEE;  Surgeon: Rosetta Posner, MD;  Location: [address];  Service: Vascular;  Laterality: Right;  . HERNIA REPAIR    . IR RADIOLOGIST EVAL & MGMT  03/06/2018  . PERIPHERAL VASCULAR BALLOON ANGIOPLASTY Left 08/19/2019   Procedure: PERIPHERAL VASCULAR BALLOON ANGIOPLASTY;  Surgeon: Marty Heck, MD;  Location: [place] [facility];  Service: Cardiovascular;  Laterality: Left;  . PROSTATE SURGERY    . TOTAL HIP ARTHROPLASTY Right 06/12/2019   Procedure: RIGHT TOTAL HIP ARTHROPLASTY DIRECT ANTERIOR;  Surgeon: Marybelle Killings, MD;  Location: [place];  Service: Orthopedics;  Laterality: Right;    Family History  Problem Relation Age of Onset  . Pneumonia Father   . Cancer Sister       Social History   Socioeconomic History  . Marital status: Married    Spouse name: [name]  . Number of children: 3  . Years of education: 9th  . Highest education level: Not on file  Occupational History    Employer: RETIRED    Comment: Retired  Tobacco Use  . Smoking status: Never Smoker  .  Smokeless tobacco: Never Used  Vaping Use  . Vaping Use: Never used  Substance and Sexual Activity  . Alcohol use: No  . Drug use: No  . Sexual activity: Not on file  Other Topics Concern  . Not on file  Social History Narrative   Patient lives at home with his wife. [name]) . Patient is retired.   Education 9th grade.   Right handed.   Caffeine None   Social Determinants of Health   Financial Resource Strain:   . Difficulty of Paying Living Expenses: Not on file  Food Insecurity:   . Worried About Charity fundraiser in the Last Year: Not on file  . Ran Out of Food in the Last Year: Not on file  Transportation Needs:   . Lack of Transportation (Medical): Not on file  . Lack of Transportation (Non-Medical): Not on file  Physical Activity:   . Days of Exercise per Week: Not on file  . Minutes of Exercise per Session: Not on file  Stress:   . Feeling of  Stress : Not on file  Social Connections:   . Frequency of Communication with Friends and Family: Not on file  . Frequency of Social Gatherings with Friends and Family: Not on file  . Attends Religious Services: Not on file  . Active Member of Clubs or Organizations: Not on file  . Attends Archivist Meetings: Not on file  . Marital Status: Not on file    Allergies  Allergen Reactions  . Fluconazole Other (See Comments)    Drug-induced hepatitis     Current Outpatient Medications:  .  acetaminophen (TYLENOL) 500 MG tablet, Take 1,000 mg by mouth every 6 (six) hours as needed for mild pain or moderate pain., Disp: , Rfl:  .  ALPRAZolam (XANAX) 0.5 MG tablet, Take 1 tablet (0.5 mg total) by mouth 2 (two) times daily as needed for anxiety., Disp: 4 tablet, Rfl: 0 .  amitriptyline (ELAVIL) 10 MG tablet, Take 20 mg by mouth at bedtime. , Disp: , Rfl:  .  amoxicillin-clavulanate (AUGMENTIN) 875-125 MG tablet, TAKE 1 TABLET BY MOUTH TWICE DAILY (Patient taking differently: Take 1 tablet by mouth 2 (two) times  daily. ), Disp: 60 tablet, Rfl: 11 .  aspirin EC 81 MG tablet, Take 81 mg by mouth daily with breakfast. Swallow whole. , Disp: , Rfl:  .  clopidogrel (PLAVIX) 75 MG tablet, Take 1 tablet (75 mg total) by mouth daily. (Patient taking differently: Take 75 mg by mouth daily with breakfast. ), Disp: 30 tablet, Rfl: 11 .  colchicine 0.6 MG tablet, Take 0.6 mg by mouth daily as needed (gout attacks). , Disp: , Rfl:  .  doxycycline (VIBRA-TABS) 100 MG tablet, Take 1 tablet (100 mg total) by mouth 2 (two) times daily., Disp: 28 tablet, Rfl: 1 .  ferrous sulfate 325 (65 FE) MG tablet, Take 1 tablet (325 mg total) by mouth 2 (two) times daily with a meal., Disp: , Rfl: 3 .  furosemide (LASIX) 20 MG tablet, Take 1 tablet (20 mg total) by mouth daily with breakfast., Disp: 30 tablet, Rfl:  .  glimepiride (AMARYL) 4 MG tablet, Take 4 mg by mouth daily with breakfast. , Disp: , Rfl:  .  insulin aspart (NOVOLOG) 100 UNIT/ML injection, Inject 0-15 Units into the skin 3 (three) times daily with meals. Sliding scale insulin Less than 70 initiate hypoglycemia protocol 70-120  0 units 120-150 2 unit 151-200 3 units 201-250 3 units 251-300 5 units 301-350 8 units 351-400 11 units  Greater than 400 15 units , call MD (Patient taking differently: Inject 0-16 Units into the skin See admin instructions. Inject 0-16 units subcutaneously three times daily with meals per sliding scale: CBG 70-120 0 units 120-150 4 units, 151-200 5 units, 201-250 6 units, 251-300 7 units, 301-350 10 units, 351-400 13 units, >400 16 units and call MD (Less than 70 initiate hypoglycemia protocol)), Disp: 10 mL, Rfl: 11 .  Insulin Glargine-Lixisenatide (SOLIQUA) 100-33 UNT-MCG/ML SOPN, Inject 40 Units into the skin daily after breakfast., Disp: , Rfl:  .  levothyroxine (SYNTHROID, LEVOTHROID) 100 MCG tablet, Take 100 mcg by mouth daily with breakfast. , Disp: , Rfl:  .  Multiple Vitamin (MULTIVITAMIN WITH MINERALS) TABS tablet, Take 1 tablet by mouth  daily., Disp: , Rfl:  .  nutrition supplement, JUVEN, (JUVEN) PACK, Take 1 packet by mouth 2 (two) times daily between meals., Disp: , Rfl: 0 .  Nutritional Supplements (,FEEDING SUPPLEMENT, PROSOURCE PLUS) liquid, Take 30 mLs by mouth 2 (two) times daily  with a meal., Disp: , Rfl:  .  nystatin (MYCOSTATIN/NYSTOP) powder, Apply topically 4 (four) times daily. (Patient taking differently: Apply 1 application topically daily as needed (yeast). ), Disp: 60 g, Rfl: 8 .  oxyCODONE (OXY IR/ROXICODONE) 5 MG immediate release tablet, Take 1 tablet (5 mg total) by mouth every 4 (four) hours as needed for moderate pain., Disp: 6 tablet, Rfl: 0 .  pantoprazole (PROTONIX) 40 MG tablet, Take 1 tablet (40 mg total) by mouth daily., Disp: , Rfl:  .  pravastatin (PRAVACHOL) 10 MG tablet, Take 10 mg by mouth daily with supper. , Disp: , Rfl:  .  primidone (MYSOLINE) 50 MG tablet, Take 150 mg by mouth at bedtime. , Disp: , Rfl:  .  senna-docusate (SENOKOT-S) 8.6-50 MG tablet, Take 1 tablet by mouth 2 (two) times daily., Disp: , Rfl:  .  silver sulfADIAZINE (SILVADENE) 1 % cream, Apply topically 2 (two) times daily., Disp: 50 g, Rfl: 0   Review of Systems  Unable to perform ROS: Dementia  Constitutional: Negative for activity change, appetite change, chills, diaphoresis, fatigue, fever and unexpected weight change.  HENT: Negative for congestion, dental problem, postnasal drip, rhinorrhea, sinus pressure, sneezing, sore throat and trouble swallowing.   Eyes: Negative for photophobia and visual disturbance.  Respiratory: Negative for cough, chest tightness, shortness of breath, wheezing and stridor.   Cardiovascular: Negative for chest pain, palpitations and leg swelling.  Gastrointestinal: Negative for abdominal distention, abdominal pain, anal bleeding, blood in stool, constipation, diarrhea, nausea and vomiting.  Genitourinary: Negative for difficulty urinating, dysuria, flank pain and hematuria.   Musculoskeletal: Negative for arthralgias, back pain, gait problem and joint swelling.  Skin: Positive for color change and wound. Negative for pallor and rash.  Neurological: Negative for dizziness, tremors, weakness and light-headedness.  Hematological: Negative for adenopathy. Does not bruise/bleed easily.  Psychiatric/Behavioral: Negative for agitation, behavioral problems, confusion, decreased concentration, dysphoric mood and sleep disturbance.       Objective:   Physical Exam Constitutional:      General: He is not in acute distress.    Appearance: Normal appearance. He is well-developed. He is not ill-appearing or diaphoretic.  HENT:     Head: Normocephalic and atraumatic.     Right Ear: Hearing and external ear normal.     Left Ear: Hearing and external ear normal.     Nose: No nasal deformity or rhinorrhea.  Eyes:     General: No scleral icterus.    Conjunctiva/sclera: Conjunctivae normal.     Right eye: Right conjunctiva is not injected.     Left eye: Left conjunctiva is not injected.  Neck:     Vascular: No JVD.  Cardiovascular:     Rate and Rhythm: Regular rhythm. Tachycardia present.     Heart sounds: S1 normal and S2 normal.  Pulmonary:     Effort: Pulmonary effort is normal. No respiratory distress.     Breath sounds: No wheezing.  Abdominal:     General: There is no distension.     Palpations: Abdomen is soft.  Musculoskeletal:     Right shoulder: Normal.     Left shoulder: Normal.     Cervical back: Normal range of motion and neck supple.     Right hip: Decreased range of motion.     Left hip: Decreased range of motion.     Right knee: Normal.     Left knee: Normal.  Lymphadenopathy:     Head:     Right side  of head: No submandibular, preauricular or posterior auricular adenopathy.     Left side of head: No submandibular, preauricular or posterior auricular adenopathy.     Cervical: No cervical adenopathy.     Right cervical: No superficial or deep  cervical adenopathy.    Left cervical: No superficial or deep cervical adenopathy.  Skin:    General: Skin is warm and dry.     Coloration: Skin is not pale.     Findings: No abrasion, bruising, ecchymosis, erythema, lesion or rash.     Nails: There is no clubbing.  Neurological:     General: No focal deficit present.     Mental Status: He is alert and oriented to person, place, and time. Mental status is at baseline.     Sensory: No sensory deficit.     Coordination: Coordination normal.     Gait: Gait normal.  Psychiatric:        Attention and Perception: He is attentive.        Mood and Affect: Mood normal.        Speech: Speech is delayed.        Behavior: Behavior normal. Behavior is cooperative.        Cognition and Memory: Cognition normal.        Judgment: Judgment normal.    Bilateral LE bandaged  Family member showed me pictures of both R BKA and left heel    Assessment & Plan:   Right osteomyelitis of calcaneous with critical limb ischemia sp BKA. 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  Right hip severe osteoarthritis: he is dramatically better after THA  Goals of Care: Son indicated that the patient had sustained CNS damage. I am concerned if we should really be pursuing all of the aggressive care he is receiving. Rx abx is the least aggressive or expensive intervention  I will see him back in a few months as long as he is stable. We will checkESR, CRP and BMP today.

## 2019-10-29 LAB — SEDIMENTATION RATE: Sed Rate: 122 mm/h — ABNORMAL HIGH (ref 0–20)

## 2019-10-29 LAB — BASIC METABOLIC PANEL WITH GFR
BUN/Creatinine Ratio: 37 (calc) — ABNORMAL HIGH (ref 6–22)
BUN: 40 mg/dL — ABNORMAL HIGH (ref 7–25)
CO2: 26 mmol/L (ref 20–32)
Calcium: 9.5 mg/dL (ref 8.6–10.3)
Chloride: 99 mmol/L (ref 98–110)
Creat: 1.07 mg/dL (ref 0.70–1.11)
GFR, Est African American: 73 mL/min/{1.73_m2} (ref >=60)
GFR, Est Non African American: 63 mL/min/{1.73_m2} (ref >=60)
Glucose, Bld: 228 mg/dL — ABNORMAL HIGH (ref 65–99)
Potassium: 4.6 mmol/L (ref 3.5–5.3)
Sodium: 134 mmol/L — ABNORMAL LOW (ref 135–146)

## 2019-10-29 LAB — C-REACTIVE PROTEIN: CRP: 134.2 mg/L — ABNORMAL HIGH (ref <8.0)

## 2019-11-03 ENCOUNTER — Ambulatory Visit: Payer: Medicare Other | Admitting: Vascular Surgery

## 2019-11-03 ENCOUNTER — Telehealth: Payer: Self-pay

## 2019-11-03 NOTE — Telephone Encounter (Signed)
Patient's caregiver called today.  Says patient needs a verbal order for [facility] to come by and change the patient's heel wound daily using Santyl.  Caregiver says patient has someone that can be taught to do the dressings daily if [facility] cannot come out as often.  Order was given via voicemail to [place], Adult nurse for [facility].  Thurston Hole., LPN

## 2019-11-05 ENCOUNTER — Emergency Department (HOSPITAL_COMMUNITY): Payer: Medicare Other

## 2019-11-05 ENCOUNTER — Inpatient Hospital Stay (HOSPITAL_COMMUNITY)
Admission: EM | Admit: 2019-11-05 | Discharge: 2019-11-26 | DRG: 854 | Disposition: A | Discharge status: Home Health | Payer: Medicare Other | Attending: Internal Medicine | Admitting: Internal Medicine

## 2019-11-05 ENCOUNTER — Encounter (HOSPITAL_COMMUNITY): Payer: Self-pay | Admitting: Emergency Medicine

## 2019-11-05 ENCOUNTER — Other Ambulatory Visit: Payer: Self-pay

## 2019-11-05 DIAGNOSIS — Z89511 Acquired absence of right leg below knee: Secondary | ICD-10-CM | POA: Diagnosis not present

## 2019-11-05 DIAGNOSIS — E08621 Diabetes mellitus due to underlying condition with foot ulcer: Secondary | ICD-10-CM | POA: Diagnosis not present

## 2019-11-05 DIAGNOSIS — B965 Pseudomonas (aeruginosa) (mallei) (pseudomallei) as the cause of diseases classified elsewhere: Secondary | ICD-10-CM | POA: Diagnosis present

## 2019-11-05 DIAGNOSIS — F0390 Unspecified dementia without behavioral disturbance: Secondary | ICD-10-CM | POA: Diagnosis present

## 2019-11-05 DIAGNOSIS — M86172 Other acute osteomyelitis, left ankle and foot: Secondary | ICD-10-CM | POA: Diagnosis not present

## 2019-11-05 DIAGNOSIS — L304 Erythema intertrigo: Secondary | ICD-10-CM | POA: Diagnosis not present

## 2019-11-05 DIAGNOSIS — N390 Urinary tract infection, site not specified: Secondary | ICD-10-CM | POA: Diagnosis present

## 2019-11-05 DIAGNOSIS — I70262 Atherosclerosis of native arteries of extremities with gangrene, left leg: Secondary | ICD-10-CM | POA: Diagnosis not present

## 2019-11-05 DIAGNOSIS — R509 Fever, unspecified: Secondary | ICD-10-CM

## 2019-11-05 DIAGNOSIS — Z7982 Long term (current) use of aspirin: Secondary | ICD-10-CM

## 2019-11-05 DIAGNOSIS — E1165 Type 2 diabetes mellitus with hyperglycemia: Secondary | ICD-10-CM | POA: Diagnosis present

## 2019-11-05 DIAGNOSIS — K5904 Chronic idiopathic constipation: Secondary | ICD-10-CM | POA: Diagnosis not present

## 2019-11-05 DIAGNOSIS — D649 Anemia, unspecified: Secondary | ICD-10-CM | POA: Diagnosis present

## 2019-11-05 DIAGNOSIS — F419 Anxiety disorder, unspecified: Secondary | ICD-10-CM | POA: Diagnosis present

## 2019-11-05 DIAGNOSIS — I313 Pericardial effusion (noninflammatory): Secondary | ICD-10-CM | POA: Diagnosis present

## 2019-11-05 DIAGNOSIS — M869 Osteomyelitis, unspecified: Secondary | ICD-10-CM

## 2019-11-05 DIAGNOSIS — R1310 Dysphagia, unspecified: Secondary | ICD-10-CM | POA: Diagnosis not present

## 2019-11-05 DIAGNOSIS — Y738 Miscellaneous gastroenterology and urology devices associated with adverse incidents, not elsewhere classified: Secondary | ICD-10-CM | POA: Diagnosis not present

## 2019-11-05 DIAGNOSIS — D508 Other iron deficiency anemias: Secondary | ICD-10-CM | POA: Diagnosis not present

## 2019-11-05 DIAGNOSIS — M1611 Unilateral primary osteoarthritis, right hip: Secondary | ICD-10-CM | POA: Diagnosis present

## 2019-11-05 DIAGNOSIS — D638 Anemia in other chronic diseases classified elsewhere: Secondary | ICD-10-CM | POA: Diagnosis present

## 2019-11-05 DIAGNOSIS — L89620 Pressure ulcer of left heel, unstageable: Secondary | ICD-10-CM | POA: Diagnosis present

## 2019-11-05 DIAGNOSIS — Z79899 Other long term (current) drug therapy: Secondary | ICD-10-CM

## 2019-11-05 DIAGNOSIS — N312 Flaccid neuropathic bladder, not elsewhere classified: Secondary | ICD-10-CM | POA: Diagnosis present

## 2019-11-05 DIAGNOSIS — E039 Hypothyroidism, unspecified: Secondary | ICD-10-CM | POA: Diagnosis present

## 2019-11-05 DIAGNOSIS — K921 Melena: Secondary | ICD-10-CM | POA: Diagnosis present

## 2019-11-05 DIAGNOSIS — E1152 Type 2 diabetes mellitus with diabetic peripheral angiopathy with gangrene: Secondary | ICD-10-CM | POA: Diagnosis present

## 2019-11-05 DIAGNOSIS — A419 Sepsis, unspecified organism: Principal | ICD-10-CM | POA: Diagnosis present

## 2019-11-05 DIAGNOSIS — I4891 Unspecified atrial fibrillation: Secondary | ICD-10-CM

## 2019-11-05 DIAGNOSIS — Z8546 Personal history of malignant neoplasm of prostate: Secondary | ICD-10-CM | POA: Diagnosis not present

## 2019-11-05 DIAGNOSIS — E11621 Type 2 diabetes mellitus with foot ulcer: Secondary | ICD-10-CM | POA: Diagnosis present

## 2019-11-05 DIAGNOSIS — L97428 Non-pressure chronic ulcer of left heel and midfoot with other specified severity: Secondary | ICD-10-CM | POA: Diagnosis not present

## 2019-11-05 DIAGNOSIS — J90 Pleural effusion, not elsewhere classified: Secondary | ICD-10-CM | POA: Diagnosis present

## 2019-11-05 DIAGNOSIS — K716 Toxic liver disease with hepatitis, not elsewhere classified: Secondary | ICD-10-CM | POA: Diagnosis not present

## 2019-11-05 DIAGNOSIS — I4819 Other persistent atrial fibrillation: Secondary | ICD-10-CM | POA: Diagnosis present

## 2019-11-05 DIAGNOSIS — Y846 Urinary catheterization as the cause of abnormal reaction of the patient, or of later complication, without mention of misadventure at the time of the procedure: Secondary | ICD-10-CM | POA: Diagnosis present

## 2019-11-05 DIAGNOSIS — Z96641 Presence of right artificial hip joint: Secondary | ICD-10-CM | POA: Diagnosis present

## 2019-11-05 DIAGNOSIS — M86659 Other chronic osteomyelitis, unspecified thigh: Secondary | ICD-10-CM | POA: Diagnosis not present

## 2019-11-05 DIAGNOSIS — Z09 Encounter for follow-up examination after completed treatment for conditions other than malignant neoplasm: Secondary | ICD-10-CM

## 2019-11-05 DIAGNOSIS — Z792 Long term (current) use of antibiotics: Secondary | ICD-10-CM | POA: Diagnosis not present

## 2019-11-05 DIAGNOSIS — T50905A Adverse effect of unspecified drugs, medicaments and biological substances, initial encounter: Secondary | ICD-10-CM | POA: Diagnosis not present

## 2019-11-05 DIAGNOSIS — E871 Hypo-osmolality and hyponatremia: Secondary | ICD-10-CM | POA: Diagnosis present

## 2019-11-05 DIAGNOSIS — M8668 Other chronic osteomyelitis, other site: Secondary | ICD-10-CM | POA: Diagnosis present

## 2019-11-05 DIAGNOSIS — E11649 Type 2 diabetes mellitus with hypoglycemia without coma: Secondary | ICD-10-CM | POA: Diagnosis not present

## 2019-11-05 DIAGNOSIS — M109 Gout, unspecified: Secondary | ICD-10-CM | POA: Diagnosis present

## 2019-11-05 DIAGNOSIS — E869 Volume depletion, unspecified: Secondary | ICD-10-CM | POA: Diagnosis present

## 2019-11-05 DIAGNOSIS — Z79890 Hormone replacement therapy (postmenopausal): Secondary | ICD-10-CM

## 2019-11-05 DIAGNOSIS — Z923 Personal history of irradiation: Secondary | ICD-10-CM | POA: Diagnosis not present

## 2019-11-05 DIAGNOSIS — T83518A Infection and inflammatory reaction due to other urinary catheter, initial encounter: Secondary | ICD-10-CM | POA: Diagnosis present

## 2019-11-05 DIAGNOSIS — D500 Iron deficiency anemia secondary to blood loss (chronic): Secondary | ICD-10-CM | POA: Diagnosis not present

## 2019-11-05 DIAGNOSIS — Z20822 Contact with and (suspected) exposure to covid-19: Secondary | ICD-10-CM | POA: Diagnosis present

## 2019-11-05 DIAGNOSIS — E785 Hyperlipidemia, unspecified: Secondary | ICD-10-CM | POA: Diagnosis present

## 2019-11-05 DIAGNOSIS — R310 Gross hematuria: Secondary | ICD-10-CM | POA: Diagnosis not present

## 2019-11-05 DIAGNOSIS — T83031A Leakage of indwelling urethral catheter, initial encounter: Secondary | ICD-10-CM | POA: Diagnosis not present

## 2019-11-05 DIAGNOSIS — H5789 Other specified disorders of eye and adnexa: Secondary | ICD-10-CM | POA: Diagnosis present

## 2019-11-05 DIAGNOSIS — L8990 Pressure ulcer of unspecified site, unspecified stage: Secondary | ICD-10-CM

## 2019-11-05 DIAGNOSIS — I483 Typical atrial flutter: Secondary | ICD-10-CM | POA: Diagnosis present

## 2019-11-05 DIAGNOSIS — R8281 Pyuria: Secondary | ICD-10-CM | POA: Diagnosis not present

## 2019-11-05 DIAGNOSIS — I739 Peripheral vascular disease, unspecified: Secondary | ICD-10-CM | POA: Diagnosis not present

## 2019-11-05 DIAGNOSIS — E1169 Type 2 diabetes mellitus with other specified complication: Secondary | ICD-10-CM | POA: Diagnosis present

## 2019-11-05 DIAGNOSIS — Z888 Allergy status to other drugs, medicaments and biological substances status: Secondary | ICD-10-CM

## 2019-11-05 DIAGNOSIS — F329 Major depressive disorder, single episode, unspecified: Secondary | ICD-10-CM | POA: Diagnosis present

## 2019-11-05 DIAGNOSIS — E876 Hypokalemia: Secondary | ICD-10-CM | POA: Diagnosis present

## 2019-11-05 DIAGNOSIS — Z794 Long term (current) use of insulin: Secondary | ICD-10-CM

## 2019-11-05 DIAGNOSIS — R140 Abdominal distension (gaseous): Secondary | ICD-10-CM | POA: Diagnosis not present

## 2019-11-05 LAB — COMPREHENSIVE METABOLIC PANEL
ALT: 33 U/L (ref 0–44)
AST: 43 U/L — ABNORMAL HIGH (ref 15–41)
Albumin: 2.6 g/dL — ABNORMAL LOW (ref 3.5–5.0)
Alkaline Phosphatase: 150 U/L — ABNORMAL HIGH (ref 38–126)
Anion gap: 13 (ref 5–15)
BUN: 23 mg/dL (ref 8–23)
CO2: 19 mmol/L — ABNORMAL LOW (ref 22–32)
Calcium: 8.2 mg/dL — ABNORMAL LOW (ref 8.9–10.3)
Chloride: 95 mmol/L — ABNORMAL LOW (ref 98–111)
Creatinine, Ser: 1.04 mg/dL (ref 0.61–1.24)
GFR calc Af Amer: >60 mL/min (ref >60)
GFR calc non Af Amer: >60 mL/min (ref >60)
Glucose, Bld: 139 mg/dL — ABNORMAL HIGH (ref 70–99)
Potassium: 3.2 mmol/L — ABNORMAL LOW (ref 3.5–5.1)
Sodium: 127 mmol/L — ABNORMAL LOW (ref 135–145)
Total Bilirubin: 0.7 mg/dL (ref 0.3–1.2)
Total Protein: 7 g/dL (ref 6.5–8.1)

## 2019-11-05 LAB — CBC WITH DIFFERENTIAL/PLATELET
Abs Immature Granulocytes: 0.04 10*3/uL (ref 0.00–0.07)
Basophils Absolute: 0 10*3/uL (ref 0.0–0.1)
Basophils Relative: 0 %
Eosinophils Absolute: 0.3 10*3/uL (ref 0.0–0.5)
Eosinophils Relative: 3 %
HCT: 24.4 % — ABNORMAL LOW (ref 39.0–52.0)
Hemoglobin: 7.4 g/dL — ABNORMAL LOW (ref 13.0–17.0)
Immature Granulocytes: 0 %
Lymphocytes Relative: 11 %
Lymphs Abs: 1.2 10*3/uL (ref 0.7–4.0)
MCH: 24.9 pg — ABNORMAL LOW (ref 26.0–34.0)
MCHC: 30.3 g/dL (ref 30.0–36.0)
MCV: 82.2 fL (ref 80.0–100.0)
Monocytes Absolute: 0.9 10*3/uL (ref 0.1–1.0)
Monocytes Relative: 8 %
Neutro Abs: 8.6 10*3/uL — ABNORMAL HIGH (ref 1.7–7.7)
Neutrophils Relative %: 78 %
Platelets: 622 10*3/uL — ABNORMAL HIGH (ref 150–400)
RBC: 2.97 MIL/uL — ABNORMAL LOW (ref 4.22–5.81)
RDW: 14.5 % (ref 11.5–15.5)
WBC: 11 10*3/uL — ABNORMAL HIGH (ref 4.0–10.5)
nRBC: 0 % (ref 0.0–0.2)

## 2019-11-05 LAB — URINALYSIS, ROUTINE W REFLEX MICROSCOPIC
Bacteria, UA: NONE SEEN
Bilirubin Urine: NEGATIVE
Glucose, UA: NEGATIVE mg/dL
Hgb urine dipstick: NEGATIVE
Ketones, ur: NEGATIVE mg/dL
Nitrite: NEGATIVE
Protein, ur: 30 mg/dL — AB
Specific Gravity, Urine: 1.014 (ref 1.005–1.030)
WBC, UA: >50 WBC/hpf — ABNORMAL HIGH (ref 0–5)
pH: 5 (ref 5.0–8.0)

## 2019-11-05 LAB — LACTIC ACID, PLASMA
Lactic Acid, Venous: 1.3 mmol/L (ref 0.5–1.9)
Lactic Acid, Venous: 2.1 mmol/L (ref 0.5–1.9)
Lactic Acid, Venous: 1.9 mmol/L (ref 0.5–1.9)

## 2019-11-05 LAB — HEMOGLOBIN A1C
Hgb A1c MFr Bld: 8.5 % — ABNORMAL HIGH (ref 4.8–5.6)
Mean Plasma Glucose: 197.25 mg/dL

## 2019-11-05 LAB — PROTIME-INR
INR: 1.2 (ref 0.8–1.2)
Prothrombin Time: 14.9 seconds (ref 11.4–15.2)

## 2019-11-05 LAB — SARS CORONAVIRUS 2 BY RT PCR (HOSPITAL ORDER, PERFORMED IN ~~LOC~~ HOSPITAL LAB): SARS Coronavirus 2: NEGATIVE

## 2019-11-05 LAB — CBG MONITORING, ED
Glucose-Capillary: 134 mg/dL — ABNORMAL HIGH (ref 70–99)
Glucose-Capillary: 127 mg/dL — ABNORMAL HIGH (ref 70–99)

## 2019-11-05 LAB — APTT: aPTT: 40 seconds — ABNORMAL HIGH (ref 24–36)

## 2019-11-05 MED ORDER — INSULIN ASPART 100 UNIT/ML ~~LOC~~ SOLN: 0.0000 [IU] | Freq: Every day | SUBCUTANEOUS | Status: DC

## 2019-11-05 MED ORDER — LACTATED RINGERS IV BOLUS (SEPSIS)
1000.0000 mL | Freq: Once | INTRAVENOUS | Status: AC
  Administered 2019-11-05: 1000 mL via INTRAVENOUS

## 2019-11-05 MED ORDER — LACTATED RINGERS IV BOLUS: 1000.0000 mL | Freq: Once | INTRAVENOUS | Status: DC

## 2019-11-05 MED ORDER — DILTIAZEM LOAD VIA INFUSION
10.0000 mg | Freq: Once | INTRAVENOUS | Status: AC
  Administered 2019-11-05: 10 mg via INTRAVENOUS
  Filled 2019-11-05: qty 10

## 2019-11-05 MED ORDER — METRONIDAZOLE IN NACL 5-0.79 MG/ML-% IV SOLN
500.0000 mg | Freq: Three times a day (TID) | INTRAVENOUS | Status: DC
  Administered 2019-11-05 – 2019-11-07 (×5): 500 mg via INTRAVENOUS
  Filled 2019-11-05 (×6): qty 100

## 2019-11-05 MED ORDER — SODIUM CHLORIDE 0.9 % IV SOLN
2.0000 g | Freq: Once | INTRAVENOUS | Status: AC
  Administered 2019-11-05: 2 g via INTRAVENOUS
  Filled 2019-11-05: qty 2

## 2019-11-05 MED ORDER — INSULIN ASPART 100 UNIT/ML ~~LOC~~ SOLN
0.0000 [IU] | Freq: Three times a day (TID) | SUBCUTANEOUS | Status: DC
  Administered 2019-11-06 (×2): 2 [IU] via SUBCUTANEOUS
  Administered 2019-11-07 (×3): 3 [IU] via SUBCUTANEOUS
  Administered 2019-11-08: 5 [IU] via SUBCUTANEOUS
  Administered 2019-11-08: 2 [IU] via SUBCUTANEOUS
  Administered 2019-11-09: 5 [IU] via SUBCUTANEOUS
  Administered 2019-11-10: 2 [IU] via SUBCUTANEOUS
  Administered 2019-11-10: 3 [IU] via SUBCUTANEOUS
  Administered 2019-11-10: 5 [IU] via SUBCUTANEOUS
  Administered 2019-11-11: 2 [IU] via SUBCUTANEOUS
  Filled 2019-11-05 (×2): qty 1

## 2019-11-05 MED ORDER — VANCOMYCIN HCL 1250 MG/250ML IV SOLN
1250.0000 mg | INTRAVENOUS | Status: DC
  Administered 2019-11-06: 2500 mg via INTRAVENOUS
  Filled 2019-11-05 (×2): qty 250

## 2019-11-05 MED ORDER — AMITRIPTYLINE HCL 10 MG PO TABS
20.0000 mg | ORAL_TABLET | Freq: Every day | ORAL | Status: DC
  Administered 2019-11-05 – 2019-11-17 (×13): 20 mg via ORAL
  Administered 2019-11-18 (×2): 10 mg via ORAL
  Administered 2019-11-19 – 2019-11-25 (×7): 20 mg via ORAL
  Filled 2019-11-05 (×25): qty 2

## 2019-11-05 MED ORDER — ADULT MULTIVITAMIN W/MINERALS CH
1.0000 | ORAL_TABLET | Freq: Every day | ORAL | Status: DC
  Administered 2019-11-05 – 2019-11-26 (×22): 1 via ORAL
  Filled 2019-11-05 (×24): qty 1

## 2019-11-05 MED ORDER — DILTIAZEM HCL-DEXTROSE 125-5 MG/125ML-% IV SOLN (PREMIX)
5.0000 mg/h | INTRAVENOUS | Status: DC
  Administered 2019-11-05 – 2019-11-07 (×2): 5 mg/h via INTRAVENOUS
  Administered 2019-11-07: 15 mg/h via INTRAVENOUS
  Administered 2019-11-08: 10 mg/h via INTRAVENOUS
  Filled 2019-11-05 (×6): qty 125

## 2019-11-05 MED ORDER — COLCHICINE 0.6 MG PO TABS: 0.6000 mg | ORAL_TABLET | Freq: Every day | ORAL | Status: DC

## 2019-11-05 MED ORDER — VANCOMYCIN HCL 2000 MG/400ML IV SOLN
2000.0000 mg | Freq: Once | INTRAVENOUS | Status: AC
  Administered 2019-11-05: 2000 mg via INTRAVENOUS
  Filled 2019-11-05: qty 400

## 2019-11-05 MED ORDER — TOBRAMYCIN 0.3 % OP SOLN
2.0000 [drp] | OPHTHALMIC | Status: DC
  Administered 2019-11-05 – 2019-11-26 (×103): 2 [drp] via OPHTHALMIC
  Filled 2019-11-05 (×7): qty 5

## 2019-11-05 MED ORDER — LACTATED RINGERS IV BOLUS (SEPSIS)
500.0000 mL | Freq: Once | INTRAVENOUS | Status: AC
  Administered 2019-11-05: 500 mL via INTRAVENOUS

## 2019-11-05 MED ORDER — HEPARIN SODIUM (PORCINE) 5000 UNIT/ML IJ SOLN
5000.0000 [IU] | Freq: Three times a day (TID) | INTRAMUSCULAR | Status: AC
  Administered 2019-11-06 – 2019-11-08 (×9): 5000 [IU] via SUBCUTANEOUS
  Filled 2019-11-05 (×10): qty 1

## 2019-11-05 MED ORDER — METRONIDAZOLE IN NACL 5-0.79 MG/ML-% IV SOLN
500.0000 mg | Freq: Once | INTRAVENOUS | Status: AC
  Administered 2019-11-05: 500 mg via INTRAVENOUS
  Filled 2019-11-05: qty 100

## 2019-11-05 MED ORDER — INSULIN ASPART 100 UNIT/ML ~~LOC~~ SOLN
0.0000 [IU] | Freq: Three times a day (TID) | SUBCUTANEOUS | Status: DC
  Administered 2019-11-05: 2 [IU] via SUBCUTANEOUS

## 2019-11-05 MED ORDER — HYDROCODONE-ACETAMINOPHEN 5-325 MG PO TABS
1.0000 | ORAL_TABLET | ORAL | Status: DC | PRN
  Administered 2019-11-11: 1 via ORAL
  Filled 2019-11-05: qty 1

## 2019-11-05 MED ORDER — LACTATED RINGERS IV BOLUS
1000.0000 mL | Freq: Once | INTRAVENOUS | Status: AC
  Administered 2019-11-05: 1000 mL via INTRAVENOUS

## 2019-11-05 MED ORDER — ACETAMINOPHEN 650 MG RE SUPP: 650.0000 mg | Freq: Four times a day (QID) | RECTAL | Status: DC | PRN

## 2019-11-05 MED ORDER — IOHEXOL 300 MG/ML  SOLN
100.0000 mL | Freq: Once | INTRAMUSCULAR | Status: AC | PRN
  Administered 2019-11-05: 100 mL via INTRAVENOUS

## 2019-11-05 MED ORDER — SODIUM CHLORIDE 0.9 % IV SOLN
2.0000 g | Freq: Two times a day (BID) | INTRAVENOUS | Status: DC
  Administered 2019-11-05 – 2019-11-06 (×3): 2 g via INTRAVENOUS
  Filled 2019-11-05 (×3): qty 2

## 2019-11-05 MED ORDER — PRAVASTATIN SODIUM 10 MG PO TABS
10.0000 mg | ORAL_TABLET | Freq: Every day | ORAL | Status: DC
  Administered 2019-11-07 – 2019-11-26 (×20): 10 mg via ORAL
  Filled 2019-11-05 (×24): qty 1

## 2019-11-05 MED ORDER — PRIMIDONE 50 MG PO TABS
150.0000 mg | ORAL_TABLET | Freq: Every day | ORAL | Status: DC
  Administered 2019-11-05 – 2019-11-14 (×10): 150 mg via ORAL
  Filled 2019-11-05 (×15): qty 3

## 2019-11-05 MED ORDER — INSULIN GLARGINE 100 UNIT/ML ~~LOC~~ SOLN
20.0000 [IU] | Freq: Every day | SUBCUTANEOUS | Status: DC
  Administered 2019-11-06 – 2019-11-08 (×3): 20 [IU] via SUBCUTANEOUS
  Filled 2019-11-05 (×7): qty 0.2

## 2019-11-05 MED ORDER — ASPIRIN EC 81 MG PO TBEC
81.0000 mg | DELAYED_RELEASE_TABLET | Freq: Every day | ORAL | Status: DC
  Administered 2019-11-06 – 2019-11-15 (×10): 81 mg via ORAL
  Filled 2019-11-05 (×10): qty 1

## 2019-11-05 MED ORDER — LACTATED RINGERS IV SOLN: INTRAVENOUS | Status: DC

## 2019-11-05 MED ORDER — GLIMEPIRIDE 4 MG PO TABS
4.0000 mg | ORAL_TABLET | Freq: Every day | ORAL | Status: DC
  Filled 2019-11-05: qty 1

## 2019-11-05 MED ORDER — SODIUM CHLORIDE 0.9 % IV SOLN: INTRAVENOUS | Status: DC

## 2019-11-05 MED ORDER — PANTOPRAZOLE SODIUM 40 MG PO TBEC
40.0000 mg | DELAYED_RELEASE_TABLET | Freq: Every day | ORAL | Status: DC
  Administered 2019-11-06 – 2019-11-07 (×2): 40 mg via ORAL
  Filled 2019-11-05 (×2): qty 1

## 2019-11-05 MED ORDER — LEVOTHYROXINE SODIUM 100 MCG PO TABS
100.0000 ug | ORAL_TABLET | Freq: Every day | ORAL | Status: DC
  Administered 2019-11-06 – 2019-11-26 (×21): 100 ug via ORAL
  Filled 2019-11-05 (×16): qty 1
  Filled 2019-11-05: qty 2
  Filled 2019-11-05 (×5): qty 1

## 2019-11-05 MED ORDER — INSULIN ASPART 100 UNIT/ML ~~LOC~~ SOLN
0.0000 [IU] | Freq: Every day | SUBCUTANEOUS | Status: DC
  Administered 2019-11-07 – 2019-11-08 (×2): 3 [IU] via SUBCUTANEOUS
  Administered 2019-11-10 – 2019-11-18 (×6): 2 [IU] via SUBCUTANEOUS
  Administered 2019-11-19: 3 [IU] via SUBCUTANEOUS
  Administered 2019-11-20: 2 [IU] via SUBCUTANEOUS
  Administered 2019-11-22: 3 [IU] via SUBCUTANEOUS
  Administered 2019-11-24: 2 [IU] via SUBCUTANEOUS

## 2019-11-05 MED ORDER — ACETAMINOPHEN 325 MG PO TABS
650.0000 mg | ORAL_TABLET | Freq: Four times a day (QID) | ORAL | Status: DC | PRN
  Administered 2019-11-06 – 2019-11-20 (×3): 650 mg via ORAL
  Filled 2019-11-05 (×3): qty 2

## 2019-11-05 MED ORDER — POTASSIUM CHLORIDE CRYS ER 20 MEQ PO TBCR
40.0000 meq | EXTENDED_RELEASE_TABLET | Freq: Four times a day (QID) | ORAL | Status: AC
  Administered 2019-11-05 (×2): 40 meq via ORAL
  Filled 2019-11-05 (×2): qty 2

## 2019-11-05 NOTE — Progress Notes (Addendum)
Pharmacy Antibiotic Note  Anthony Dunlap is a 86 y.o. male admitted on 11/05/2019 with sepsis and wound infection. Patient recently admitted in 09/2019 for osteomyelitis of right heel and had BKA.  Pharmacy has been consulted for Vancomycin and Cefepime dosing.    Plan: Vancomycin 2000 mg IV x 1 dose. Vancomycin 1250 mg IV every 24 hours. Expected AUC 533. Cefepime 2000 mg IV every 12 hours. Monitor labs, c/s, and vanco level as indicated.   Height: 5\' 8"  (172.7 cm) Weight: 77.1 kg (170 lb) IBW/kg (Calculated) : 68.4  Temp (24hrs), Avg:100.8 F (38.2 C), Min:100.4 F (38 C), Max:101.1 F (38.4 C)  Recent Labs  Lab 11/05/19 1203  WBC 11.0*  CREATININE 1.04  LATICACIDVEN 1.3    Estimated Creatinine Clearance: 51.2 mL/min (by C-G formula based on SCr of 1.04 mg/dL).    Allergies  Allergen Reactions  . Fluconazole Other (See Comments)    Drug-induced hepatitis    Antimicrobials this admission: Vanco 9/16 >>  Cefepime 9/16 >> Flagyl 9/16 >>   Microbiology results: 9/16 BCx: pending 9/16 UCx: pending    Thank you for allowing pharmacy to be a part of this patient's care.  Ramond Craver 11/05/2019 1:25 PM

## 2019-11-05 NOTE — ED Notes (Signed)
Date and time results received: 11/05/19 4:18 PM    Test:Lactic Acid Critical Value: 2.1  Name of Provider Notified: Dr.Wentz  Orders Received? Or Actions Taken? See orders

## 2019-11-05 NOTE — ED Notes (Signed)
Patient transported to CT 

## 2019-11-05 NOTE — H&P (Signed)
TRH H&P   Patient Demographics:    Anthony Dunlap, is a 86 y.o. male  MRN: [id number]   DOB - 1933-12-16  Admit Date - 11/05/2019  Outpatient Primary MD for the patient is [name], MD  Referring MD/NP/PA: Dr Eulis Foster  Patient coming from: Home  Chief Complaint  Patient presents with  . possible sepsis      HPI:    Anthony Dunlap  is a 86 y.o. male, past medical history of diabetes mellitus, prostate cancer, aortic angioplasty, peripheral vascular disease following with [name], history of pelvic/right hip osteomyelitis on chronic antibiotic suppressive therapy for him by [name], patient was recently admitted to [facility] due to severe PVD and right lower extremity status post BKA, patient lives home alone currently with home health (wife passed away before 2 weeks), patient was brought to ED secondary to fever, patient himself is very poor historian cannot give any details, reports I am feeling fine, I do not know why they brought me over, per daughter he had a home health visiting nurse, she noted him to be febrile this morning, looked more pale than usual which prompted her to call EMS.  Patient is following with [name] regarding chronic pelvic/pubic symphysis osteomyelitis, he is chronically on Augmentin and doxycycline, doxycycline has been stopped 8 days ago. - in ED patient was noted to be in A. fib with RVR, new diagnosis, heart rate in the 140s, he was febrile 101.1, lactic acid elevated at 2.1, creatinine at baseline of 1.04, hemoglobin at baseline of 7.4, white blood cell is 11, CT abdomen and pelvis was significant for exudative pericardial effusion, and small right inguinal hernia with no evidence of obstruction, and known pubic symphysis osteomyelitis, [facility] consulted to admit.    Review of systems:    In addition to the HPI above,  Fever  and chills No Headache, No changes with Vision or hearing, but the he does report bilateral eye discharge for few weeks.   No problems swallowing food or Liquids, No Chest pain, Cough or Shortness of Breath, No Abdominal pain, No Nausea or Vommitting, Bowel movements are regular, No Blood in stool or Urine, No dysuria, he has chronic Foley. No new skin rashes or bruises, No new joints pains-aches,  No new weakness, tingling, numbness in any extremity, No recent weight gain or loss, No polyuria, polydypsia or polyphagia, No significant Mental Stressors.  A full 10 point Review of Systems was done, except as stated above, all other Review of Systems were negative.   With Past History of the following :    Past Medical History:  Diagnosis Date  . Chronic heel ulcer, left, limited to breakdown of skin ([place]) 10/28/2019  . 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 ([place]) 04/21/2018  . Diabetes ([place])   . Drug-induced hepatitis 06/05/2018  . Inguinal hernia    06/09/2019:  per patient " a long time ago on the right side"  . Left eye pain   . Localized osteoarthrosis of right hip 01/05/2019  . Prostate cancer [facility])       Past Surgical History:  Procedure Laterality Date  . ABDOMINAL AORTOGRAM W/LOWER EXTREMITY Bilateral 08/12/2019   Procedure: ABDOMINAL AORTOGRAM W/LOWER EXTREMITY;  Surgeon: Marty Heck, MD;  Location: [place] [facility];  Service: Cardiovascular;  Laterality: Bilateral;  . ABDOMINAL AORTOGRAM W/LOWER EXTREMITY Left 08/19/2019   Procedure: ABDOMINAL AORTOGRAM W/LOWER EXTREMITY;  Surgeon: Marty Heck, MD;  Location: [place] [facility];  Service: Cardiovascular;  Laterality: Left;  . AMPUTATION Right 09/28/2019   Procedure: AMPUTATION BELOW KNEE;  Surgeon: Rosetta Posner, MD;  Location: [place];  Service: Vascular;  Laterality: Right;  . HERNIA REPAIR    . IR RADIOLOGIST EVAL & MGMT  03/06/2018  . PERIPHERAL VASCULAR BALLOON ANGIOPLASTY Left 08/19/2019    Procedure: PERIPHERAL VASCULAR BALLOON ANGIOPLASTY;  Surgeon: Marty Heck, MD;  Location: [place] [facility];  Service: Cardiovascular;  Laterality: Left;  . PROSTATE SURGERY    . TOTAL HIP ARTHROPLASTY Right 06/12/2019   Procedure: RIGHT TOTAL HIP ARTHROPLASTY DIRECT ANTERIOR;  Surgeon: Marybelle Killings, MD;  Location: [place];  Service: Orthopedics;  Laterality: Right;      Social History:     Social History   Tobacco Use  . Smoking status: Never Smoker  . Smokeless tobacco: Never Used  Substance Use Topics  . Alcohol use: No      Family History :     Family History  Problem Relation Age of Onset  . Pneumonia Father   . Cancer Sister      Home Medications:   Prior to Admission medications   Medication Sig Start Date End Date Taking? Authorizing Provider  acetaminophen (TYLENOL) 500 MG tablet Take 1,000 mg by mouth every 6 (six) hours as needed for mild pain or moderate pain.    [provider]  ALPRAZolam Duanne Moron) 0.5 MG tablet Take 1 tablet (0.5 mg total) by mouth 2 (two) times daily as needed for anxiety. 09/30/19   Geradine Girt, DO  amitriptyline (ELAVIL) 10 MG tablet Take 20 mg by mouth at bedtime.  11/28/18   [provider]  amoxicillin-clavulanate (AUGMENTIN) 875-125 MG tablet TAKE 1 TABLET BY MOUTH TWICE DAILY Patient taking differently: Take 1 tablet by mouth 2 (two) times daily.  06/12/19   Truman Hayward, MD  aspirin EC 81 MG tablet Take 81 mg by mouth daily with breakfast. Swallow whole.     [provider]  clopidogrel (PLAVIX) 75 MG tablet Take 1 tablet (75 mg total) by mouth daily. Patient not taking: Reported on 10/28/2019 08/19/19 08/18/20  Marty Heck, MD  colchicine 0.6 MG tablet Take 0.6 mg by mouth daily as needed (gout attacks).  07/27/19   [provider]  ferrous sulfate 325 (65 FE) MG tablet Take 1 tablet (325 mg total) by mouth 2 (two) times daily with a meal. 09/30/19   Geradine Girt, DO   furosemide (LASIX) 20 MG tablet Take 1 tablet (20 mg total) by mouth daily with breakfast. 10/02/19   Geradine Girt, DO  glimepiride (AMARYL) 4 MG tablet Take 4 mg by mouth daily with breakfast.  04/18/18   [provider]  insulin aspart (NOVOLOG) 100 UNIT/ML injection Inject 0-15 Units into the skin 3 (three) times daily with meals. Sliding scale insulin Less than 70  initiate hypoglycemia protocol 70-120  0 units 120-150 2 unit 151-200 3 units 201-250 3 units 251-300 5 units 301-350 8 units 351-400 11 units  Greater than 400 15 units , call MD Patient taking differently: Inject 0-16 Units into the skin See admin instructions. Inject 0-16 units subcutaneously three times daily with meals per sliding scale: CBG 70-120 0 units 120-150 4 units, 151-200 5 units, 201-250 6 units, 251-300 7 units, 301-350 10 units, 351-400 13 units, >400 16 units and call MD (Less than 70 initiate hypoglycemia protocol) 02/18/18   Oswald Hillock, MD  Insulin Glargine-Lixisenatide (SOLIQUA) 100-33 UNT-MCG/ML SOPN Inject 40 Units into the skin daily after breakfast.    [provider]  levothyroxine (SYNTHROID, LEVOTHROID) 100 MCG tablet Take 100 mcg by mouth daily with breakfast.  04/18/18   [provider]  Multiple Vitamin (MULTIVITAMIN WITH MINERALS) TABS tablet Take 1 tablet by mouth daily. 09/30/19   Geradine Girt, DO  nutrition supplement, JUVEN, (JUVEN) PACK Take 1 packet by mouth 2 (two) times daily between meals. 09/30/19   Geradine Girt, DO  Nutritional Supplements (,FEEDING SUPPLEMENT, PROSOURCE PLUS) liquid Take 30 mLs by mouth 2 (two) times daily with a meal. 09/30/19   Geradine Girt, DO  nystatin (MYCOSTATIN/NYSTOP) powder Apply topically 4 (four) times daily. Patient taking differently: Apply 1 application topically daily as needed (yeast).  03/03/19   Truman Hayward, MD  oxyCODONE (OXY IR/ROXICODONE) 5 MG immediate release tablet Take 1 tablet (5 mg total) by mouth  every 4 (four) hours as needed for moderate pain. 09/30/19   Geradine Girt, DO  pantoprazole (PROTONIX) 40 MG tablet Take 1 tablet (40 mg total) by mouth daily. 09/30/19   Geradine Girt, DO  pravastatin (PRAVACHOL) 10 MG tablet Take 10 mg by mouth daily with supper.     [provider]  primidone (MYSOLINE) 50 MG tablet Take 150 mg by mouth at bedtime.  02/26/19   [provider]  senna-docusate (SENOKOT-S) 8.6-50 MG tablet Take 1 tablet by mouth 2 (two) times daily. 09/30/19   Geradine Girt, DO  silver sulfADIAZINE (SILVADENE) 1 % cream Apply topically 2 (two) times daily. 09/30/19   Geradine Girt, DO     Allergies:     Allergies  Allergen Reactions  . Fluconazole Other (See Comments)    Drug-induced hepatitis     Physical Exam:   Vitals  Blood pressure 120/82, pulse (!) 128, temperature (!) 101.1 F (38.4 C), temperature source Rectal, resp. rate 19, height 5\' 8"  (1.727 m), weight 77.1 kg, SpO2 96 %.   1. General extremely frail, deconditioned male, laying in bed, in no apparent distress  2.  Patient is awake, alert, oriented x3, he is appropriate and answering questions appropriately  3. No F.N deficits, ALL C.Nerves Intact, Strength 5/5 all 4 extremities,   4. Ears appear Normal, Conjunctivae clear, he has some bilateral purulent discharge from his eyes, PERRLA.  Dry oral Mucosa.  5. Supple Neck, No JVD, No cervical lymphadenopathy appriciated, No Carotid Bruits.  6. Symmetrical Chest wall movement, Good air movement bilaterally, CTAB.  7.  Irregular irregular, tachycardic, No Gallops,  Murmurs, No Parasternal Heave.  8. Positive Bowel Sounds, Abdomen Soft, No tenderness, No organomegaly appriciated,No rebound -guarding or rigidity.  9.  No Cyanosis, Normal Skin Turgor, No Skin Rash or Bruise.  10. Good muscle tone, right BKA, left heel wound, please see picture below.  11. No Palpable Lymph Nodes in  Neck or Axillae       Data Review:     CBC Recent Labs  Lab 11/05/19 1203  WBC 11.0*  HGB 7.4*  HCT 24.4*  PLT 622*  MCV 82.2  MCH 24.9*  MCHC 30.3  RDW 14.5  LYMPHSABS 1.2  MONOABS 0.9  EOSABS 0.3  BASOSABS 0.0   ------------------------------------------------------------------------------------------------------------------  Chemistries  Recent Labs  Lab 11/05/19 1203  NA 127*  K 3.2*  CL 95*  CO2 19*  GLUCOSE 139*  BUN 23  CREATININE 1.04  CALCIUM 8.2*  AST 43*  ALT 33  ALKPHOS 150*  BILITOT 0.7   ------------------------------------------------------------------------------------------------------------------ estimated creatinine clearance is 51.2 mL/min (by C-G formula based on SCr of 1.04 mg/dL). ------------------------------------------------------------------------------------------------------------------ No results for input(s): TSH, T4TOTAL, T3FREE, THYROIDAB in the last 72 hours.  Invalid input(s): FREET3  Coagulation profile Recent Labs  Lab 11/05/19 1203  INR 1.2   ------------------------------------------------------------------------------------------------------------------- No results for input(s): DDIMER in the last 72 hours. -------------------------------------------------------------------------------------------------------------------  Cardiac Enzymes No results for input(s): CKMB, TROPONINI, MYOGLOBIN in the last 168 hours.  Invalid input(s): CK ------------------------------------------------------------------------------------------------------------------ No results found for: BNP   ---------------------------------------------------------------------------------------------------------------  Urinalysis    Component Value Date/Time   COLORURINE YELLOW 11/05/2019 [place] 11/05/2019 1155   LABSPEC 1.014 11/05/2019 1155   PHURINE 5.0 11/05/2019 1155   GLUCOSEU NEGATIVE 11/05/2019 1155   HGBUR NEGATIVE 11/05/2019 1155   [place] 11/05/2019 1155   [address] 11/05/2019 1155   PROTEINUR 30 (A) 11/05/2019 1155   UROBILINOGEN 0.2 09/03/2013 0132   NITRITE NEGATIVE 11/05/2019 1155   LEUKOCYTESUR LARGE (A) 11/05/2019 1155    ----------------------------------------------------------------------------------------------------------------   Imaging Results:    CT Abdomen Pelvis W Contrast  Result Date: 11/05/2019 CLINICAL DATA:  Bowel obstruction, pubic symphyseal osteomyelitis EXAM: CT ABDOMEN AND PELVIS WITH CONTRAST TECHNIQUE: Multidetector CT imaging of the abdomen and pelvis was performed using the standard protocol following bolus administration of intravenous contrast. CONTRAST:  198mL OMNIPAQUE IOHEXOL 300 MG/ML  SOLN COMPARISON:  None. FINDINGS: Lower chest: Small bilateral pleural effusions are present with associated mild bibasilar compressive atelectasis. Small pericardial effusion is present, new since prior examination, with associated pericardial enhancement suggesting a complex/exudative effusion. Cardiac size is within normal limits. No CT evidence of cardiac tamponade. Extensive distal right coronary artery calcification. Hepatobiliary: No focal liver abnormality is seen. No gallstones, gallbladder wall thickening, or biliary dilatation. Pancreas: Moderate atrophy, but otherwise unremarkable. Spleen: Unremarkable Adrenals/Urinary Tract: The adrenal glands are unremarkable. The kidneys are normal in size and position. Tiny exophytic cortical cyst within the upper pole of the left kidney. The kidneys are otherwise unremarkable. The bladder is decompressed with a Foley catheter balloon seen within its lumen. Stomach/Bowel: Stomach and small bowel are unremarkable. The sigmoid colon is markedly redundant, however, there is no evidence of obstruction or focal inflammation. Moderate stool within the ascending and transverse colon. The appendix is not visualized and is likely absent. There is no free  intraperitoneal gas or fluid. Vascular/Lymphatic: No pathologic adenopathy within the abdomen and pelvis. There is mild aortoiliac atherosclerotic calcification noted without evidence of aneurysm. Particularly prominent calcification, however, is seen at the origin of the mesenteric and renal vasculature, though the degree of stenosis is not well assessed on this non arteriographic study. Reproductive: Brachytherapy seeds are seen within the prostate gland. Other: Small right inguinal hernia is present containing portions of a distal small bowel and the cecum as well as a small amount of mesenteric fat. Tiny fat  containing left inguinal hernia is present. Musculoskeletal: Right total hip arthroplasty has been performed. Moderate left hip degenerative arthritis. Mixed lytic and sclerotic changes involving the pubic symphyses are compatible with the given history of symphyseal osteomyelitis. Calcifications seen at the a hamstring origins likely relates to remote trauma or inflammation. Degenerative changes are seen within the lumbar spine. No lytic or blastic bone lesions are seen. IMPRESSION: 1. Small pericardial effusion with associated pericardial enhancement suggesting a complex/exudative effusion. No CT evidence of cardiac tamponade. 2. Small bilateral pleural effusions. 3. Small right inguinal hernia containing portions of a distal small bowel and cecum. No evidence of bowel obstruction. 4. 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 Aortic Atherosclerosis (ICD10-I70.0). Electronically Signed   By: Fidela Salisbury MD   On: 11/05/2019 15:44   DG Chest Port 1 View  Result Date: 11/05/2019 CLINICAL DATA:  New onset AFib EXAM: PORTABLE CHEST 1 VIEW COMPARISON:  June 09, 2019 FINDINGS: The cardiomediastinal silhouette is unchanged in contour when accounting for differences in technique.Trace fluid in the RIGHT minor fissure. No  pleural effusion. No pneumothorax. No acute pleuroparenchymal abnormality. Gaseous distension of the colon. Multilevel degenerative changes of the thoracic spine. IMPRESSION: No acute cardiopulmonary abnormality. Electronically Signed   By: Valentino Saxon MD   On: 11/05/2019 12:26    My personal review of EKG: A fib with RVR heart rate at 144   Assessment & Plan:    Active Problems:   HLD (hyperlipidemia)   Chronic anemia   Hypothyroidism   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 ([name])    Sepsis -Sepsis present on admission, febrile, tachypneic, tachycardic, with elevated lactic acid -Source can be related to UTI, given his chronic Foley and positive UA, follow on urine cultures, will follow on blood cultures, as well he is with known history of pelvic/pubic symphysis osteomyelitis which is still present on imaging, and there is a questionable cardiac exudate as well. -Continue with broad-spectrum antibiotic coverage vancomycin, cefepime and Flagyl, follow on septic work-up.  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 as an outpatient, on doxycycline and Augmentin, doxycycline has been recently stopped, for now we will keep on broad-spectrum antibiotics.  Pericardial effusion -Patient has no circulatory compromise, per report it reads as exudative, will obtain 2D echo, will consult cardiology once results are available.  A. fib with RVR -New diagnosis, heart rate uncontrolled, will start on Cardizem drip. -No anticoagulation for now as discussed with cardiology Dr. Harl Bowie till his pericardial effusion worked up.  PVD Status post right BKA, left lower extremity wound is healing. -Continue with aspirin, will hold Plavix in case he needs intervention for pericardial effusion.  Left heel pressure ulcer -We will consult wound care  Chronic anemia -Hemoglobin 7.4, at baseline, no indication for transfusion.  Hyponatremia -Volume  depletion, continue with IV fluids  Hypokalemia -Repleted, recheck in a.m..  Bilateral eye discharge -Patient with no evidence of conjunctivitis , but he does have bilateral eye discharge, will start on antibiotics eyedrop .  Diabetes mellitus -Hold oral agents, will resume Lantus at a lower dose , will add insulin sliding scale, will check A1c.    DVT Prophylaxis Heparin  AM Labs Ordered, also please review Full Orders  Family Communication: Admission, patients condition and plan of care including tests being ordered have been discussed with the patient and daughter pinnix by phone   who indicate understanding and agree with  the plan and Code Status.  Code Status Full  Likely DC to  : pending  Condition GUARDED   Consults called: D/W cardiology  At [facility].  Admission status: inpatient  Time spent in minutes : 60 minutes   Phillips Climes M.D on 11/05/2019 at 4:51 PM   [facility] - Office  [phone]

## 2019-11-05 NOTE — ED Triage Notes (Signed)
Pt sent from home by [facility]. Patient has a temp of 99.3. EMS reports new onset Afib. Pt had a recent hospitalization for UTI and recently left rehab.

## 2019-11-05 NOTE — ED Provider Notes (Signed)
[facility] EMERGENCY DEPARTMENT Provider Note   CSN: [id number] Arrival date & time: 11/05/19  1118     History Chief Complaint  Patient presents with  . possible sepsis    Anthony Dunlap is a 86 y.o. male.  HPI He presents for evaluation of suspected infection.  He is unable to give any history.  Apparently home health nurse, was concerned about infection so sent him here by EMS for evaluation.  Level V Caveat- Altered Mental Status  Significant recent history in the EMR-admitted to the hospital early August 2021 with osteomyelitis of the right heel.  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  His vascular status of the left leg was assessed and has been followed closely by vascular surgery.  He saw them for evaluation of a wound of the left heel, 9 days ago at that time wound care was changed to [facility] dressing for debridement.  He is being followed expectantly for that.  He also saw infectious disease, 8 days ago at that time doxycycline was discontinued.  He had been treated for pelvic osteomyelitis, with doxycycline.    Past Medical History:  Diagnosis Date  . Chronic heel ulcer, left, limited to breakdown of skin ([place]) 10/28/2019  . 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 Diabetes ([place])   . Drug-induced hepatitis 06/05/2018  . Inguinal hernia    06/09/2019: per patient " a long time ago on the right side"  . Left eye pain   . Localized osteoarthrosis of right hip 01/05/2019  . Prostate cancer [facility])     Patient Active Problem List   Diagnosis Date Noted  . Chronic heel ulcer, left, limited to breakdown of skin ([place]) 10/28/2019  . Acute osteomyelitis of calcaneum, right ([place]) 09/25/2019  . Critical lower limb ischemia 08/11/2019  . Hx of total hip arthroplasty, right 08/06/2019  . Arthritis of right hip 06/12/2019  . Pelvic abscess in male [facility]) 09/11/2018  . Drug-induced hepatitis 06/05/2018  . Calculus of gallbladder without  cholecystitis without obstruction   . Fungemia/Candida albicans 05/10/2018  . UTI (urinary tract infection) 05/08/2018  . Complicated UTI (urinary tract infection) 05/08/2018  . 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 Abscess of left thigh   . Pressure injury of skin 02/10/2018  . Enterococcus faecalis infection 01/27/2018  . Abscess   . Psoas abscess ([place]) 01/23/2018  . Sepsis due to Bacteroides species ([place]) 01/22/2018  . Sepsis without acute organ dysfunction ([place])   . DKA (diabetic ketoacidoses) ([place]) 01/17/2018  . Normocytic anemia 01/17/2018  . Chronic anemia 01/03/2018  . Chronic indwelling Foley catheter 01/03/2018  . Elevated alkaline phosphatase level 01/03/2018  . Hypothyroidism 01/03/2018  . Physical deconditioning 01/03/2018  . 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 HLD (hyperlipidemia) 08/19/2013  . Type 2 diabetes mellitus ([place])   . Prostate cancer [facility])     Past Surgical History:  Procedure Laterality Date  . ABDOMINAL AORTOGRAM W/LOWER EXTREMITY Bilateral 08/12/2019   Procedure: ABDOMINAL AORTOGRAM W/LOWER EXTREMITY;  Surgeon: Marty Heck, MD;  Location: [place] CV LAB;  Service: Cardiovascular;  Laterality: Bilateral;  . ABDOMINAL AORTOGRAM W/LOWER EXTREMITY Left 08/19/2019   Procedure: ABDOMINAL AORTOGRAM W/LOWER EXTREMITY;  Surgeon: Marty Heck, MD;  Location: [place] CV LAB;  Service: Cardiovascular;  Laterality: Left;  . AMPUTATION Right 09/28/2019   Procedure: AMPUTATION BELOW KNEE;  Surgeon: Rosetta Posner, MD;  Location: [place];  Service: Vascular;  Laterality: Right;  . HERNIA REPAIR    . IR RADIOLOGIST EVAL & MGMT  03/06/2018  . PERIPHERAL VASCULAR BALLOON ANGIOPLASTY Left 08/19/2019   Procedure: PERIPHERAL VASCULAR BALLOON ANGIOPLASTY;  Surgeon: Marty Heck, MD;  Location: [place] [facility];  Service: Cardiovascular;  Laterality: Left;  . PROSTATE SURGERY    . TOTAL HIP ARTHROPLASTY  Right 06/12/2019   Procedure: RIGHT TOTAL HIP ARTHROPLASTY DIRECT ANTERIOR;  Surgeon: Marybelle Killings, MD;  Location: [place];  Service: Orthopedics;  Laterality: Right;       Family History  Problem Relation Age of Onset  . Pneumonia Father   . Cancer Sister     Social History   Tobacco Use  . Smoking status: Never Smoker  . Smokeless tobacco: Never Used  Vaping Use  . Vaping Use: Never used  Substance Use Topics  . Alcohol use: No  . Drug use: No    Home Medications Prior to Admission medications   Medication Sig Start Date End Date Taking? Authorizing Provider  acetaminophen (TYLENOL) 500 MG tablet Take 1,000 mg by mouth every 6 (six) hours as needed for mild pain or moderate pain.    [provider]  ALPRAZolam Duanne Moron) 0.5 MG tablet Take 1 tablet (0.5 mg total) by mouth 2 (two) times daily as needed for anxiety. 09/30/19   Geradine Girt, DO  amitriptyline (ELAVIL) 10 MG tablet Take 20 mg by mouth at bedtime.  11/28/18   [provider]  amoxicillin-clavulanate (AUGMENTIN) 875-125 MG tablet TAKE 1 TABLET BY MOUTH TWICE DAILY Patient taking differently: Take 1 tablet by mouth 2 (two) times daily.  06/12/19   Truman Hayward, MD  aspirin EC 81 MG tablet Take 81 mg by mouth daily with breakfast. Swallow whole.     [provider]  clopidogrel (PLAVIX) 75 MG tablet Take 1 tablet (75 mg total) by mouth daily. Patient not taking: Reported on 10/28/2019 08/19/19 08/18/20  Marty Heck, MD  colchicine 0.6 MG tablet Take 0.6 mg by mouth daily as needed (gout attacks).  07/27/19   [provider]  ferrous sulfate 325 (65 FE) MG tablet Take 1 tablet (325 mg total) by mouth 2 (two) times daily with a meal. 09/30/19   Geradine Girt, DO  furosemide (LASIX) 20 MG tablet Take 1 tablet (20 mg total) by mouth daily with breakfast. 10/02/19   Geradine Girt, DO  glimepiride (AMARYL) 4 MG tablet Take 4 mg by mouth daily with breakfast.  04/18/18   [provider]  insulin aspart (NOVOLOG) 100 UNIT/ML injection Inject 0-15 Units into the skin 3 (three) times daily with meals. Sliding scale insulin Less than 70 initiate hypoglycemia protocol 70-120  0 units 120-150 2 unit 151-200 3 units 201-250 3 units 251-300 5 units 301-350 8 units 351-400 11 units  Greater than 400 15 units , call MD Patient taking differently: Inject 0-16 Units into the skin See admin instructions. Inject 0-16 units subcutaneously three times daily with meals per sliding scale: CBG 70-120 0 units 120-150 4 units, 151-200 5 units, 201-250 6 units, 251-300 7 units, 301-350 10 units, 351-400 13 units, >400 16 units and call MD (Less than 70 initiate hypoglycemia protocol) 02/18/18   Oswald Hillock, MD  Insulin Glargine-Lixisenatide (SOLIQUA) 100-33 UNT-MCG/ML SOPN Inject 40 Units into the skin daily after breakfast.    [provider]  levothyroxine (SYNTHROID, LEVOTHROID) 100 MCG tablet Take 100 mcg by mouth daily  with breakfast.  04/18/18   [provider]  Multiple Vitamin (MULTIVITAMIN WITH MINERALS) TABS tablet Take 1 tablet by mouth daily. 09/30/19   Geradine Girt, DO  nutrition supplement, JUVEN, (JUVEN) PACK Take 1 packet by mouth 2 (two) times daily between meals. 09/30/19   Geradine Girt, DO  Nutritional Supplements (,FEEDING SUPPLEMENT, PROSOURCE PLUS) liquid Take 30 mLs by mouth 2 (two) times daily with a meal. 09/30/19   Geradine Girt, DO  nystatin (MYCOSTATIN/NYSTOP) powder Apply topically 4 (four) times daily. Patient taking differently: Apply 1 application topically daily as needed (yeast).  03/03/19   Truman Hayward, MD  oxyCODONE (OXY IR/ROXICODONE) 5 MG immediate release tablet Take 1 tablet (5 mg total) by mouth every 4 (four) hours as needed for moderate pain. 09/30/19   Geradine Girt, DO  pantoprazole (PROTONIX) 40 MG tablet Take 1 tablet (40 mg total) by mouth daily. 09/30/19   Geradine Girt, DO  pravastatin (PRAVACHOL) 10  MG tablet Take 10 mg by mouth daily with supper.     [provider]  primidone (MYSOLINE) 50 MG tablet Take 150 mg by mouth at bedtime.  02/26/19   [provider]  senna-docusate (SENOKOT-S) 8.6-50 MG tablet Take 1 tablet by mouth 2 (two) times daily. 09/30/19   Geradine Girt, DO  silver sulfADIAZINE (SILVADENE) 1 % cream Apply topically 2 (two) times daily. 09/30/19   Geradine Girt, DO    Allergies    Fluconazole  Review of Systems   Review of Systems  All other systems reviewed and are negative.   Physical Exam Updated Vital Signs BP 117/82   Pulse (!) 130   Temp (!) 101.1 F (38.4 C) (Rectal)   Resp 18   Ht $R'5\' 8"'NB$  (1.727 m)   Wt 77.1 kg   SpO2 96%   BMI 25.85 kg/m   Physical Exam Vitals and nursing note reviewed.  Constitutional:      General: He is not in acute distress.    Appearance: He is well-developed. He is not ill-appearing, toxic-appearing or diaphoretic.  HENT:     Head: Normocephalic and atraumatic.     Right Ear: External ear normal.     Left Ear: External ear normal.     Nose: No congestion or rhinorrhea.     Mouth/Throat:     Mouth: Mucous membranes are moist.     Pharynx: No oropharyngeal exudate or posterior oropharyngeal erythema.  Eyes:     Conjunctiva/sclera: Conjunctivae normal.     Pupils: Pupils are equal, round, and reactive to light.  Neck:     Trachea: Phonation normal.  Cardiovascular:     Rate and Rhythm: Tachycardia present. Rhythm irregular.  Pulmonary:     Effort: Pulmonary effort is normal. No respiratory distress.     Breath sounds: No stridor. No wheezing or rhonchi.  Abdominal:     General: There is distension.     Palpations: Abdomen is soft.     Tenderness: There is no abdominal tenderness.  Genitourinary:    Comments: Anus is somewhat redundant, but has normal internal sphincter tone.  No rectal mass, no fecal impaction, no stool in rectum.  Rectal examination is not tender. Musculoskeletal:         General: Normal range of motion.     Cervical back: Normal range of motion and neck supple.  Skin:    General: Skin is warm and dry.     Comments: Left heel with  approximately 6 cm diameter pressure sore, skin loss present, but normal granulation present, without fluctuance, drainage or proximal streaking.  Neurological:     Mental Status: He is alert.     Cranial Nerves: No cranial nerve deficit.     Sensory: No sensory deficit.     Motor: No abnormal muscle tone.     Coordination: Coordination normal.  Psychiatric:        Mood and Affect: Mood normal.        Behavior: Behavior normal.     ED Results / Procedures / Treatments   Labs (all labs ordered are listed, but only abnormal results are displayed) Labs Reviewed  LACTIC ACID, PLASMA - Abnormal; Notable for the following components:      Result Value   Lactic Acid, Venous 2.1 (*)    All other components within normal limits  COMPREHENSIVE METABOLIC PANEL - Abnormal; Notable for the following components:   Sodium 127 (*)    Potassium 3.2 (*)    Chloride 95 (*)    CO2 19 (*)    Glucose, Bld 139 (*)    Calcium 8.2 (*)    Albumin 2.6 (*)    AST 43 (*)    Alkaline Phosphatase 150 (*)    All other components within normal limits  CBC WITH DIFFERENTIAL/PLATELET - Abnormal; Notable for the following components:   WBC 11.0 (*)    RBC 2.97 (*)    Hemoglobin 7.4 (*)    HCT 24.4 (*)    MCH 24.9 (*)    Platelets 622 (*)    Neutro Abs 8.6 (*)    All other components within normal limits  APTT - Abnormal; Notable for the following components:   aPTT 40 (*)    All other components within normal limits  URINALYSIS, ROUTINE W REFLEX MICROSCOPIC - Abnormal; Notable for the following components:   Protein, ur 30 (*)    Leukocytes,Ua LARGE (*)    WBC, UA >50 (*)    Non Squamous Epithelial 0-5 (*)    All other components within normal limits  SARS CORONAVIRUS 2 BY RT PCR (HOSPITAL ORDER, [place] LAB)   CULTURE, BLOOD (ROUTINE X 2)  CULTURE, BLOOD (ROUTINE X 2)  URINE CULTURE  LACTIC ACID, PLASMA  PROTIME-INR    EKG EKG Interpretation  Date/Time:  Thursday November 05 2019 11:38:18 EDT Ventricular Rate:  144 PR Interval:    QRS Duration: 89 QT Interval:  292 QTC Calculation: 452 R Axis:   47 Text Interpretation: Atrial fibrillation with rapid V-rate Abnormal R-wave progression, early transition Inferior infarct, age indeterminate Lateral leads are also involved Since last tracing rate faster and now in atrial fibrillation Otherwise no significant change Confirmed by Daleen Bo [phone]) on 11/05/2019 2:37:49 PM   Radiology CT Abdomen Pelvis W Contrast  Result Date: 11/05/2019 CLINICAL DATA:  Bowel obstruction, pubic symphyseal osteomyelitis EXAM: CT ABDOMEN AND PELVIS WITH CONTRAST TECHNIQUE: Multidetector CT imaging of the abdomen and pelvis was performed using the standard protocol following bolus administration of intravenous contrast. CONTRAST:  136mL OMNIPAQUE IOHEXOL 300 MG/ML  SOLN COMPARISON:  None. FINDINGS: Lower chest: Small bilateral pleural effusions are present with associated mild bibasilar compressive atelectasis. Small pericardial effusion is present, new since prior examination, with associated pericardial enhancement suggesting a complex/exudative effusion. Cardiac size is within normal limits. No CT evidence of cardiac tamponade. Extensive distal right coronary artery calcification. Hepatobiliary: No focal liver abnormality is seen. No gallstones, gallbladder wall thickening, or  biliary dilatation. Pancreas: Moderate atrophy, but otherwise unremarkable. Spleen: Unremarkable Adrenals/Urinary Tract: The adrenal glands are unremarkable. The kidneys are normal in size and position. Tiny exophytic cortical cyst within the upper pole of the left kidney. The kidneys are otherwise unremarkable. The bladder is decompressed with a Foley catheter balloon seen within its lumen.  Stomach/Bowel: Stomach and small bowel are unremarkable. The sigmoid colon is markedly redundant, however, there is no evidence of obstruction or focal inflammation. Moderate stool within the ascending and transverse colon. The appendix is not visualized and is likely absent. There is no free intraperitoneal gas or fluid. Vascular/Lymphatic: No pathologic adenopathy within the abdomen and pelvis. There is mild aortoiliac atherosclerotic calcification noted without evidence of aneurysm. Particularly prominent calcification, however, is seen at the origin of the mesenteric and renal vasculature, though the degree of stenosis is not well assessed on this non arteriographic study. Reproductive: Brachytherapy seeds are seen within the prostate gland. Other: Small right inguinal hernia is present containing portions of a distal small bowel and the cecum as well as a small amount of mesenteric fat. Tiny fat containing left inguinal hernia is present. Musculoskeletal: Right total hip arthroplasty has been performed. Moderate left hip degenerative arthritis. Mixed lytic and sclerotic changes involving the pubic symphyses are compatible with the given history of symphyseal osteomyelitis. Calcifications seen at the a hamstring origins likely relates to remote trauma or inflammation. Degenerative changes are seen within the lumbar spine. No lytic or blastic bone lesions are seen. IMPRESSION: 1. Small pericardial effusion with associated pericardial enhancement suggesting a complex/exudative effusion. No CT evidence of cardiac tamponade. 2. Small bilateral pleural effusions. 3. Small right inguinal hernia containing portions of a distal small bowel and cecum. No evidence of bowel obstruction. 4. 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 Aortic Atherosclerosis (ICD10-I70.0). Electronically Signed   By: Helyn Numbers MD   On: 11/05/2019 15:44    DG Chest Port 1 View  Result Date: 11/05/2019 CLINICAL DATA:  New onset AFib EXAM: PORTABLE CHEST 1 VIEW COMPARISON:  June 09, 2019 FINDINGS: The cardiomediastinal silhouette is unchanged in contour when accounting for differences in technique.Trace fluid in the RIGHT minor fissure. No pleural effusion. No pneumothorax. No acute pleuroparenchymal abnormality. Gaseous distension of the colon. Multilevel degenerative changes of the thoracic spine. IMPRESSION: No acute cardiopulmonary abnormality. Electronically Signed   By: Meda Klinefelter MD   On: 11/05/2019 12:26    Procedures Procedures (including critical care time)  Medications Ordered in ED Medications  lactated ringers infusion ( Intravenous New Bag/Given 11/05/19 1242)  vancomycin (VANCOREADY) IVPB 1250 mg/250 mL (has no administration in time range)  lactated ringers bolus 1,000 mL (has no administration in time range)  lactated ringers bolus 1,000 mL (0 mLs Intravenous Stopped 11/05/19 1357)    And  lactated ringers bolus 1,000 mL (0 mLs Intravenous Stopped 11/05/19 1425)    And  lactated ringers bolus 500 mL (0 mLs Intravenous Stopped 11/05/19 1425)  ceFEPIme (MAXIPIME) 2 g in sodium chloride 0.9 % 100 mL IVPB (0 g Intravenous Stopped 11/05/19 1254)  metroNIDAZOLE (FLAGYL) IVPB 500 mg (0 mg Intravenous Stopped 11/05/19 1345)  vancomycin (VANCOREADY) IVPB 2000 mg/400 mL (0 mg Intravenous Stopped 11/05/19 1453)  iohexol (OMNIPAQUE) 300 MG/ML solution 100 mL (100 mLs Intravenous Contrast Given 11/05/19 1520)    ED Course  I have reviewed the triage vital signs and the nursing notes.  Pertinent labs & imaging results that  were available during my care of the patient were reviewed by me and considered in my medical decision making (see chart for details).  Clinical Course as of Nov 05 1619  Thu Nov 05, 2019  1153 Patient presenting with signs and symptoms, concerning for sepsis, source unclear, recent diagnosis of pelvic  osteomyelitis.   [EW]  1220 Empiric antibiotics begun for osteomyelitis associated sepsis versus sepsis of unknown etiology.   [EW]  1423 Normal  Lactic acid, plasma [EW]  1423 Normal except sodium low, potassium low, chloride low, CO2 low, glucose high, calcium low, albumin low, AST high, alk phos stays high  Comprehensive metabolic panel(!) [EW]  5621 Normal except presence of protein, large leukocytes, greater than 50 white cells.  Urine culture pending  Urinalysis, Routine w reflex microscopic Urine, Catheterized(!) [EW]  1424 Negative  SARS Coronavirus 2 by RT PCR (hospital order, performed in [facility] hospital lab) Nasopharyngeal Nasopharyngeal Swab [EW]  3086 I discussed the situation with the patient's daughter, [name].  She states that he has had decreased appetite with a single episode of vomiting and abdominal distention over the last week.  She has never known that he had atrial fibrillation in the past.   [EW]    Clinical Course User Index [EW] Daleen Bo, MD   MDM Rules/Calculators/A&P                           Patient Vitals for the past 24 hrs:  BP Temp Temp src Pulse Resp SpO2 Height Weight  11/05/19 1600 117/82 -- -- (!) 130 18 96 % -- --  11/05/19 1530 116/79 -- -- -- 20 -- -- --  11/05/19 1500 118/85 -- -- (!) 137 (!) 23 97 % -- --  11/05/19 1430 120/86 -- -- -- (!) 23 -- -- --  11/05/19 1400 (!) 135/100 -- -- (!) 135 20 96 % -- --  11/05/19 1330 116/84 -- -- (!) 138 15 96 % -- --  11/05/19 1300 120/77 -- -- (!) 140 (!) 24 98 % -- --  11/05/19 1230 103/64 -- -- (!) 144 (!) 27 93 % -- --  11/05/19 1200 115/77 -- -- (!) 149 (!) 31 94 % -- --  11/05/19 1138 -- (!) 101.1 F (38.4 C) Rectal -- -- -- -- --  11/05/19 1129 132/88 (!) 100.4 F (38 C) Oral (!) 147 (!) 28 -- -- --  11/05/19 1126 -- -- -- -- -- -- $Rem'5\' 8"'ASAM$  (1.727 m) 77.1 kg    4:21 PM Reevaluation with update and discussion. After initial assessment and treatment, an updated evaluation reveals he  remains comfortable at this time has no further complaints.  Findings discussed with the patient and all questions were answered. Daleen Bo   Medical Decision Making:  This patient is presenting for evaluation of fever and suspected infection, which does require a range of treatment options, and is a complaint that involves a high risk of morbidity and mortality. The differential diagnoses include sepsis, UTI, pneumonia, Covid infection. I decided to review old records, and in summary elderly male, being managed home following recent hospitalization and rehab stays.  Apparently acutely ill today with suspected infection.  I obtained additional historical information from his daughter by telephone.  Clinical Laboratory Tests Ordered, included CBC, Metabolic panel, Urinalysis and Lactate, blood culture, blood gas, Covid test. Review indicates white count slightly higher than baseline, hemoglobin slightly lower than baseline.  Mild metabolic abnormalities likely associated  with dehydration.. Radiologic Tests Ordered, included chest x-ray.  I independently Visualized: Radiograph images, which show no infiltrate or edema  Cardiac Monitor Tracing which shows atrial fibrillation with RVR.  Critical Interventions-clinical evaluation, laboratory testing, empiric antibiotics, empiric bolus with saline for sepsis, observation and reassessment.  Discussion with family member.  After These Interventions, the Patient was reevaluated and was found with sepsis, unspecified, and new onset atrial fibrillation with RVR.  Blood pressure did not drop and become hypotensive in the ED.  Heart rate stayed elevated, with IV fluids.  Evaluation does not show clear source of infection however recurrent osteomyelitis is likely.  CT scan of the abdomen pelvis done for distention and decreased stooling, does not show any bowel obstruction.  It is unclear how the prior osteomyelitis was diagnosed.  He may need to have a bone  biopsy, assuming he is stable for that procedure.  He requires hospitalization.  Atrial fibrillation is probably secondary to the infection.  CRITICAL CARE-yes Performed by: Daleen Bo  Nursing Notes Reviewed/ Care Coordinated Applicable Imaging Reviewed Interpretation of Laboratory Data incorporated into ED treatment  4:20 PM-Consult complete with hospitalist. Patient case explained and discussed.  He agrees to admit patient for further evaluation and treatment, if cardiology does not want to intervene immediately on his small pericardial effusion. Call ended at 4:20 PM  Plan: Admit  Dr. Sabra Heck will talk to the hospitalist for final disposition planning.  Final Clinical Impression(s) / ED Diagnoses Final diagnoses:  Sepsis, due to unspecified organism, unspecified whether acute organ dysfunction present ([place])  Osteomyelitis, unspecified site, unspecified type ([place])  Atrial fibrillation with RVR [facility])    Rx / DC Orders ED Discharge Orders    None       Daleen Bo, MD 11/05/19 1636

## 2019-11-05 NOTE — Sepsis Progress Note (Signed)
Notified bedside nurse of need to draw repeat lactic acid since the lactates are trending up.

## 2019-11-05 NOTE — ED Notes (Signed)
Left heal wound exposed for inspection. Wound is packed and clean. Dressing reapplied.

## 2019-11-06 ENCOUNTER — Inpatient Hospital Stay (HOSPITAL_COMMUNITY): Payer: Medicare Other

## 2019-11-06 DIAGNOSIS — I313 Pericardial effusion (noninflammatory): Secondary | ICD-10-CM

## 2019-11-06 DIAGNOSIS — E785 Hyperlipidemia, unspecified: Secondary | ICD-10-CM

## 2019-11-06 DIAGNOSIS — I4891 Unspecified atrial fibrillation: Secondary | ICD-10-CM

## 2019-11-06 DIAGNOSIS — A419 Sepsis, unspecified organism: Principal | ICD-10-CM

## 2019-11-06 LAB — ECHOCARDIOGRAM COMPLETE
AR max vel: 2.36 cm2
AV Area VTI: 2.8 cm2
AV Area mean vel: 2.29 cm2
AV Mean grad: 3.3 mmHg
AV Peak grad: 7.2 mmHg
Ao pk vel: 1.34 m/s
Area-P 1/2: 3.61 cm2
Height: 68 in
S' Lateral: 2.44 cm
Weight: 2719.98 oz

## 2019-11-06 LAB — HEMOGLOBIN AND HEMATOCRIT, BLOOD
HCT: 23.5 % — ABNORMAL LOW (ref 39.0–52.0)
Hemoglobin: 7 g/dL — ABNORMAL LOW (ref 13.0–17.0)

## 2019-11-06 LAB — URINE CULTURE

## 2019-11-06 LAB — COMPREHENSIVE METABOLIC PANEL
ALT: 29 U/L (ref 0–44)
AST: 28 U/L (ref 15–41)
Albumin: 2.2 g/dL — ABNORMAL LOW (ref 3.5–5.0)
Alkaline Phosphatase: 131 U/L — ABNORMAL HIGH (ref 38–126)
Anion gap: 6 (ref 5–15)
BUN: 18 mg/dL (ref 8–23)
CO2: 21 mmol/L — ABNORMAL LOW (ref 22–32)
Calcium: 7.6 mg/dL — ABNORMAL LOW (ref 8.9–10.3)
Chloride: 105 mmol/L (ref 98–111)
Creatinine, Ser: 0.87 mg/dL (ref 0.61–1.24)
GFR calc Af Amer: >60 mL/min (ref >60)
GFR calc non Af Amer: >60 mL/min (ref >60)
Glucose, Bld: 152 mg/dL — ABNORMAL HIGH (ref 70–99)
Potassium: 4.2 mmol/L (ref 3.5–5.1)
Sodium: 132 mmol/L — ABNORMAL LOW (ref 135–145)
Total Bilirubin: 0.6 mg/dL (ref 0.3–1.2)
Total Protein: 6.3 g/dL — ABNORMAL LOW (ref 6.5–8.1)

## 2019-11-06 LAB — CBG MONITORING, ED
Glucose-Capillary: 147 mg/dL — ABNORMAL HIGH (ref 70–99)
Glucose-Capillary: 165 mg/dL — ABNORMAL HIGH (ref 70–99)
Glucose-Capillary: 225 mg/dL — ABNORMAL HIGH (ref 70–99)

## 2019-11-06 LAB — CBC
HCT: 22.8 % — ABNORMAL LOW (ref 39.0–52.0)
Hemoglobin: 6.9 g/dL — CL (ref 13.0–17.0)
MCH: 25.2 pg — ABNORMAL LOW (ref 26.0–34.0)
MCHC: 30.3 g/dL (ref 30.0–36.0)
MCV: 83.2 fL (ref 80.0–100.0)
Platelets: 606 10*3/uL — ABNORMAL HIGH (ref 150–400)
RBC: 2.74 MIL/uL — ABNORMAL LOW (ref 4.22–5.81)
RDW: 14.6 % (ref 11.5–15.5)
WBC: 10.5 10*3/uL (ref 4.0–10.5)
nRBC: 0 % (ref 0.0–0.2)

## 2019-11-06 LAB — GLUCOSE, CAPILLARY: Glucose-Capillary: 180 mg/dL — ABNORMAL HIGH (ref 70–99)

## 2019-11-06 LAB — TSH: TSH: 1.987 u[IU]/mL (ref 0.350–4.500)

## 2019-11-06 LAB — PREPARE RBC (CROSSMATCH)

## 2019-11-06 LAB — PHOSPHORUS: Phosphorus: 2.6 mg/dL (ref 2.5–4.6)

## 2019-11-06 LAB — MAGNESIUM: Magnesium: 1.7 mg/dL (ref 1.7–2.4)

## 2019-11-06 MED ORDER — CHLORHEXIDINE GLUCONATE CLOTH 2 % EX PADS
6.0000 | MEDICATED_PAD | Freq: Every day | CUTANEOUS | Status: DC
  Administered 2019-11-06 – 2019-11-26 (×21): 6 via TOPICAL

## 2019-11-06 MED ORDER — DILTIAZEM HCL 60 MG PO TABS
60.0000 mg | ORAL_TABLET | Freq: Three times a day (TID) | ORAL | Status: DC
  Administered 2019-11-06 (×2): 60 mg via ORAL
  Filled 2019-11-06 (×2): qty 1
  Filled 2019-11-06: qty 2

## 2019-11-06 MED ORDER — COLLAGENASE 250 UNIT/GM EX OINT
TOPICAL_OINTMENT | Freq: Every day | CUTANEOUS | Status: DC
  Administered 2019-11-07: 1 via TOPICAL
  Filled 2019-11-06 (×2): qty 30

## 2019-11-06 MED ORDER — FERROUS SULFATE 325 (65 FE) MG PO TABS
325.0000 mg | ORAL_TABLET | Freq: Every day | ORAL | Status: DC
  Administered 2019-11-07 – 2019-11-26 (×20): 325 mg via ORAL
  Filled 2019-11-06 (×21): qty 1

## 2019-11-06 MED ORDER — SODIUM CHLORIDE 0.9% IV SOLUTION: Freq: Once | INTRAVENOUS | Status: DC

## 2019-11-06 MED ORDER — COLCHICINE 0.6 MG PO TABS: 0.6000 mg | ORAL_TABLET | Freq: Every day | ORAL | Status: DC | PRN

## 2019-11-06 MED ORDER — CLOPIDOGREL BISULFATE 75 MG PO TABS
75.0000 mg | ORAL_TABLET | Freq: Every day | ORAL | Status: AC
  Administered 2019-11-07 – 2019-11-08 (×2): 75 mg via ORAL
  Filled 2019-11-06 (×2): qty 1

## 2019-11-06 NOTE — Progress Notes (Signed)
PROGRESS NOTE   Anthony Dunlap  EHM:[id number] DOB: 1933/07/31 DOA: 11/05/2019 PCP: [name], MD   Chief Complaint  Patient presents with  . possible sepsis    Brief Admission History:  86 y.o. male, past medical history of diabetes mellitus, prostate cancer, aortic angioplasty, peripheral vascular disease following with [name], history of pelvic/right hip osteomyelitis on chronic antibiotic suppressive therapy for him by [name], patient was recently admitted to [facility] due to severe PVD and right lower extremity status post BKA, patient lives home alone currently with home health (wife passed away before 2 weeks), patient was brought to ED secondary to fever, patient himself is very poor historian cannot give any details, reports I am feeling fine, I do not know why they brought me over, per daughter he had a home health visiting nurse, she noted him to be febrile this morning, looked more pale than usual which prompted her to call EMS.  Patient is following with [name] regarding chronic pelvic/pubic symphysis osteomyelitis, he is chronically on Augmentin and doxycycline, doxycycline has been stopped 8 days ago.   In ED patient was noted to be in A. fib with RVR, new diagnosis, heart rate in the 140s, he was febrile 101.1, lactic acid elevated at 2.1, creatinine at baseline of 1.04, hemoglobin at baseline of 7.4, white blood cell is 11, CT abdomen and pelvis was significant for exudative pericardial effusion, and small right inguinal hernia with no evidence of obstruction, and known pubic symphysis osteomyelitis, [facility] hospitalist consulted to admit.  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 -Continue with broad-spectrum antibiotic coverage vancomycin, cefepime and Flagyl, follow on septic work-up.  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 as an outpatient, on doxycycline and Augmentin, doxycycline has been recently stopped, for now we will keep on broad-spectrum antibiotics.  Pt being transferred to [facility] for ID consult and recommendations.   Pericardial effusion -Patient has no circulatory compromise, per report it reads as exudative, per cardiology 2D echo was not impressive and does not need further follow up.   A. fib with RVR -New diagnosis, heart rate uncontrolled, controlled on Cardizem drip. -No anticoagulation for now as discussed with cardiology Dr. Harl Bowie.  PVD Status post right BKA, left lower extremity wound is healing. -Continue with aspirin, Plavix  Anemia - Hg down from hemodilution, received 3L of IV fluids in ED.  He is being transfused 1 unit PRBC.  CBC in AM.   Left heel pressure ulcer -Consult wound care  Chronic anemia -Hemoglobin 7.4, at baseline, no indication for transfusion.  Hyponatremia -Volume depletion, continue with IV fluids  Hypokalemia -Repleted, recheck in a.m..  Bilateral eye discharge -Patient with no evidence of conjunctivitis , but he does have bilateral eye discharge, will start on antibiotics eyedrop .  Diabetes mellitus -Hold oral agents, will resume Lantus at a lower dose , will add insulin sliding scale, will check A1c.  CBG (last 3)  Recent Labs  11/05/19 2129 11/06/19 0942 11/06/19 1151  GLUCAP 127* 147* 165*    DVT prophylaxis: heparin SQ Code Status: full  Family Communication:  Disposition: transfer to [facility]  Status  is: Inpatient  Remains inpatient appropriate because:IV treatments appropriate due to intensity of illness or inability to take PO and Inpatient level of care appropriate due to severity of illness   Dispo: The patient is from: Home              Anticipated d/c is to: SNF              Anticipated d/c date is: 3 days              Patient currently is not medically stable to d/c. Consultants:   cardiology  Procedures:     Antimicrobials:  Anti-infectives (From admission, onward)   Start     Dose/Rate Route Frequency Ordered Stop   11/06/19 1300  vancomycin (VANCOREADY) IVPB 1250 mg/250 mL        1,250 mg 166.7 mL/hr over 90 Minutes Intravenous Every 24 hours 11/05/19 1324     11/06/19 0000  ceFEPIme (MAXIPIME) 2 g in sodium chloride 0.9 % 100 mL IVPB        2 g 200 mL/hr over 30 Minutes Intravenous Every 12 hours 11/05/19 1648     11/05/19 2200  metroNIDAZOLE (FLAGYL) IVPB 500 mg        500 mg 100 mL/hr over 60 Minutes Intravenous Every 8 hours 11/05/19 1637     11/05/19 1300  vancomycin (VANCOREADY) IVPB 2000 mg/400 mL        2,000 mg 200 mL/hr over 120 Minutes Intravenous  Once 11/05/19 1226 11/05/19 1453   11/05/19 1230  metroNIDAZOLE (FLAGYL) IVPB 500 mg        500 mg 100 mL/hr over 60 Minutes Intravenous  Once 11/05/19 1223 11/05/19 1345   11/05/19 1215  ceFEPIme (MAXIPIME) 2 g in sodium chloride 0.9 % 100 mL IVPB        2 g 200 mL/hr over 30 Minutes Intravenous  Once 11/05/19 1201 11/05/19 1254     Subjective: Pt unsure why he is here.    Objective: Vitals:   11/06/19 1115 11/06/19 1130 11/06/19 1200 11/06/19 1215  BP:  119/68 125/77   Pulse: 89 80 77 85  Resp: 17 20 19 20   Temp: (!) 97.5 F (36.4 C) 97.7 F (36.5 C) (!) 97.5 F (36.4 C) 97.7 F (36.5 C)  TempSrc:      SpO2: 96% 95% 92% 97%  Weight:      Height:        Intake/Output Summary (Last 24 hours) at 11/06/2019 1306 Last data filed at 11/06/2019 0802 Gross per 24 hour  Intake 4893.18 ml   Output --  Net 4893.18 ml   Filed Weights   11/05/19 1126  Weight: 77.1 kg    Examination:  General exam: Appears calm and comfortable  Respiratory system: Clear to auscultation. Respiratory effort normal. Cardiovascular system: normal S1 & S2 heard. Irregularly irregular.  Gastrointestinal system: Abdomen is nondistended, soft and nontender. No organomegaly or masses felt. Normal bowel sounds heard. Central nervous system: Alert and oriented. No focal neurological deficits. Extremities: Symmetric 5 x 5 power. Skin: left heel ulceration.    Psychiatry: Judgement and insight appear normal. Mood & affect appropriate.   Data Reviewed: I have personally reviewed following labs and imaging studies  CBC: Recent Labs  Lab 11/05/19 1203 11/06/19 0303  WBC 11.0*  10.5  NEUTROABS 8.6*  --   HGB 7.4* 6.9*  HCT 24.4* 22.8*  MCV 82.2 83.2  PLT 622* 606*    Basic Metabolic Panel: Recent Labs  Lab 11/05/19 1203 11/06/19 0303  NA 127* 132*  K 3.2* 4.2  CL 95* 105  CO2 19* 21*  GLUCOSE 139* 152*  BUN 23 18  CREATININE 1.04 0.87  CALCIUM 8.2* 7.6*  MG  --  1.7  PHOS  --  2.6    GFR: Estimated Creatinine Clearance: 61.1 mL/min (by C-G formula based on SCr of 0.87 mg/dL).  Liver Function Tests: Recent Labs  Lab 11/05/19 1203 11/06/19 0303  AST 43* 28  ALT 33 29  ALKPHOS 150* 131*  BILITOT 0.7 0.6  PROT 7.0 6.3*  ALBUMIN 2.6* 2.2*    CBG: Recent Labs  Lab 11/05/19 1710 11/05/19 2129 11/06/19 0942 11/06/19 1151  GLUCAP 134* 127* 147* 165*    Recent Results (from the past 240 hour(s))  Blood Culture (routine x 2)     Status: None (Preliminary result)   Collection Time: 11/05/19 12:03 PM   Specimen: BLOOD  Result Value Ref Range Status   Specimen Description BLOOD  Final   Special Requests NONE  Final   Culture   Final    NO GROWTH < 24 HOURS Performed at [facility], [address], [place], [place] [address]    Report Status PENDING  Incomplete   Blood Culture (routine x 2)     Status: None (Preliminary result)   Collection Time: 11/05/19 12:12 PM   Specimen: BLOOD  Result Value Ref Range Status   Specimen Description BLOOD  Final   Special Requests NONE  Final   Culture   Final    NO GROWTH < 24 HOURS Performed at [facility], [address] [place], [place] [address]    Report Status PENDING  Incomplete  SARS Coronavirus 2 by RT PCR (hospital order, performed in [place] hospital lab) Nasopharyngeal Nasopharyngeal Swab     Status: None   Collection Time: 11/05/19 12:17 PM   Specimen: Nasopharyngeal Swab  Result Value Ref Range Status   SARS Coronavirus 2 NEGATIVE NEGATIVE Final    Comment: (NOTE) SARS-CoV-2 target nucleic acids are NOT DETECTED.  The SARS-CoV-2 RNA is generally detectable in upper and lower respiratory specimens during the acute phase of infection. The lowest concentration of SARS-CoV-2 viral copies this assay can detect is 250 copies / mL. A negative result does not preclude SARS-CoV-2 infection and should not be used as the sole basis for treatment or other patient management decisions.  A negative result may occur with improper specimen collection / handling, submission of specimen other than nasopharyngeal swab, presence of viral mutation(s) within the areas targeted by this assay, and inadequate number of viral copies (<250 copies / mL). A negative result must be combined with clinical observations, patient history, and epidemiological information.  Fact Sheet for Patients:   StrictlyIdeas.no  Fact Sheet for Healthcare Providers: BankingDealers.co.za  This test is not yet approved or  cleared by the [place] FDA and has been authorized for detection and/or diagnosis of SARS-CoV-2 by FDA under an Emergency Use Authorization (EUA).  This EUA will remain in effect (meaning this test can be used) for the duration of the COVID-19 declaration  under Section 564(b)(1) of the Act, 21 U.S.C. section 360bbb-3(b)(1), unless the authorization is terminated or revoked sooner.  Performed at [facility], [address], [place],  [address]  Radiology Studies: CT Abdomen Pelvis W Contrast  Result Date: 11/05/2019 CLINICAL DATA:  Bowel obstruction, pubic symphyseal osteomyelitis EXAM: CT ABDOMEN AND PELVIS WITH CONTRAST TECHNIQUE: Multidetector CT imaging of the abdomen and pelvis was performed using the standard protocol following bolus administration of intravenous contrast. CONTRAST:  142mL OMNIPAQUE IOHEXOL 300 MG/ML  SOLN COMPARISON:  None. FINDINGS: Lower chest: Small bilateral pleural effusions are present with associated mild bibasilar compressive atelectasis. Small pericardial effusion is present, new since prior examination, with associated pericardial enhancement suggesting a complex/exudative effusion. Cardiac size is within normal limits. No CT evidence of cardiac tamponade. Extensive distal right coronary artery calcification. Hepatobiliary: No focal liver abnormality is seen. No gallstones, gallbladder wall thickening, or biliary dilatation. Pancreas: Moderate atrophy, but otherwise unremarkable. Spleen: Unremarkable Adrenals/Urinary Tract: The adrenal glands are unremarkable. The kidneys are normal in size and position. Tiny exophytic cortical cyst within the upper pole of the left kidney. The kidneys are otherwise unremarkable. The bladder is decompressed with a Foley catheter balloon seen within its lumen. Stomach/Bowel: Stomach and small bowel are unremarkable. The sigmoid colon is markedly redundant, however, there is no evidence of obstruction or focal inflammation. Moderate stool within the ascending and transverse colon. The appendix is not visualized and is likely absent. There is no free intraperitoneal gas or fluid. Vascular/Lymphatic: No pathologic adenopathy within the abdomen and pelvis. There is mild aortoiliac  atherosclerotic calcification noted without evidence of aneurysm. Particularly prominent calcification, however, is seen at the origin of the mesenteric and renal vasculature, though the degree of stenosis is not well assessed on this non arteriographic study. Reproductive: Brachytherapy seeds are seen within the prostate gland. Other: Small right inguinal hernia is present containing portions of a distal small bowel and the cecum as well as a small amount of mesenteric fat. Tiny fat containing left inguinal hernia is present. Musculoskeletal: Right total hip arthroplasty has been performed. Moderate left hip degenerative arthritis. Mixed lytic and sclerotic changes involving the pubic symphyses are compatible with the given history of symphyseal osteomyelitis. Calcifications seen at the a hamstring origins likely relates to remote trauma or inflammation. Degenerative changes are seen within the lumbar spine. No lytic or blastic bone lesions are seen. IMPRESSION: 1. Small pericardial effusion with associated pericardial enhancement suggesting a complex/exudative effusion. No CT evidence of cardiac tamponade. 2. Small bilateral pleural effusions. 3. Small right inguinal hernia containing portions of a distal small bowel and cecum. No evidence of bowel obstruction. 4. 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 Aortic Atherosclerosis (ICD10-I70.0). Electronically Signed   By: Fidela Salisbury MD   On: 11/05/2019 15:44   DG Chest Port 1 View  Result Date: 11/05/2019 CLINICAL DATA:  New onset AFib EXAM: PORTABLE CHEST 1 VIEW COMPARISON:  June 09, 2019 FINDINGS: The cardiomediastinal silhouette is unchanged in contour when accounting for differences in technique.Trace fluid in the RIGHT minor fissure. No pleural effusion. No pneumothorax. No acute pleuroparenchymal abnormality. Gaseous distension of the colon. Multilevel degenerative  changes of the thoracic spine. IMPRESSION: No acute cardiopulmonary abnormality. Electronically Signed   By: Valentino Saxon MD   On: 11/05/2019 12:26   ECHOCARDIOGRAM COMPLETE  Result Date: 11/06/2019    ECHOCARDIOGRAM REPORT   Patient Name:   Anthony Dunlap Date of Exam: 11/06/2019 Medical Rec #:  [id number]      Height:       68.0 in Accession #:    [id number]  Weight:       170.0 lb Date of Birth:  Jan 28, 1934      BSA:          1.907 m Patient Age:    42 years       BP:           103/75 mmHg Patient Gender: M              HR:           78 bpm. Exam Location:  [facility] Procedure: 2D Echo, Cardiac Doppler and Color Doppler Indications:    Prostate Cancer  History:        Patient has prior history of Echocardiogram examinations, most                 recent 01/23/2018. Risk Factors:Dyslipidemia and Diabetes.                 Prostate Cancer.  Sonographer:    Alvino Chapel RCS Referring Phys: [id number] DAWOOD S [facility]  Sonographer Comments: Technically difficult study due to poor echo windows. IMPRESSIONS  1. Left ventricular ejection fraction, by estimation, is 60 to 65%. The left ventricle has normal function. Left ventricular endocardial border not optimally defined to evaluate regional wall motion. There is mild left ventricular hypertrophy. Left ventricular diastolic parameters are indeterminate.  2. Right ventricular systolic function is normal. The right ventricular size is normal.  3. Left atrial size was mildly dilated.  4. The pericardial effusion is circumferential.  5. The mitral valve is normal in structure. Trivial mitral valve regurgitation. No evidence of mitral stenosis.  6. The aortic valve was not well visualized. Aortic valve regurgitation is not visualized. No aortic stenosis is present. FINDINGS  Left Ventricle: Left ventricular ejection fraction, by estimation, is 60 to 65%. The left ventricle has normal function. Left ventricular endocardial border not optimally defined to evaluate  regional wall motion. The left ventricular internal cavity size was normal in size. There is mild left ventricular hypertrophy. Left ventricular diastolic parameters are indeterminate. Right Ventricle: The right ventricular size is normal. No increase in right ventricular wall thickness. Right ventricular systolic function is normal. Left Atrium: Left atrial size was mildly dilated. Right Atrium: Right atrial size was normal in size. Pericardium: Trivial pericardial effusion is present. The pericardial effusion is circumferential. Mitral Valve: The mitral valve is normal in structure. Trivial mitral valve regurgitation. No evidence of mitral valve stenosis. Tricuspid Valve: The tricuspid valve is normal in structure. Tricuspid valve regurgitation is not demonstrated. No evidence of tricuspid stenosis. Aortic Valve: The aortic valve was not well visualized. Aortic valve regurgitation is not visualized. No aortic stenosis is present. Aortic valve mean gradient measures 3.3 mmHg. Aortic valve peak gradient measures 7.2 mmHg. Aortic valve area, by VTI measures 2.80 cm. Pulmonic Valve: The pulmonic valve was not well visualized. Pulmonic valve regurgitation is not visualized. No evidence of pulmonic stenosis. Aorta: The aortic root is normal in size and structure. Pulmonary Artery: Indeterminant PASP, inadequate TR jet. Venous: The inferior vena cava was not well visualized. IAS/Shunts: The interatrial septum was not well visualized.  LEFT VENTRICLE PLAX 2D LVIDd:         4.18 cm LVIDs:         2.44 cm LV PW:         1.28 cm LV IVS:        1.12 cm LVOT diam:     2.00 cm LV SV:  56 LV SV Index:   29 LVOT Area:     3.14 cm  RIGHT VENTRICLE RV S prime:     13.00 cm/s TAPSE (M-mode): 1.4 cm LEFT ATRIUM             Index       RIGHT ATRIUM           Index LA diam:        2.60 cm 1.36 cm/m  RA Area:     15.90 cm LA Vol (A2C):   57.8 ml 30.30 ml/m RA Volume:   38.20 ml  20.03 ml/m LA Vol (A4C):   58.6 ml 30.72  ml/m LA Biplane Vol: 59.0 ml 30.93 ml/m  AORTIC VALVE AV Area (Vmax):    2.36 cm AV Area (Vmean):   2.29 cm AV Area (VTI):     2.80 cm AV Vmax:           134.16 cm/s AV Vmean:          83.194 cm/s AV VTI:            0.199 m AV Peak Grad:      7.2 mmHg AV Mean Grad:      3.3 mmHg LVOT Vmax:         100.60 cm/s LVOT Vmean:        60.700 cm/s LVOT VTI:          0.178 m LVOT/AV VTI ratio: 0.89  AORTA Ao Root diam: 3.00 cm MITRAL VALVE                TRICUSPID VALVE MV Area (PHT): 3.61 cm     TR Peak grad:   22.3 mmHg MV Decel Time: 210 msec     TR Vmax:        236.00 cm/s MV E velocity: 123.00 cm/s                             SHUNTS                             Systemic VTI:  0.18 m                             Systemic Diam: 2.00 cm Carlyle Dolly MD Electronically signed by Carlyle Dolly MD Signature Date/Time: 11/06/2019/11:30:42 AM    Final     Scheduled Meds: . sodium chloride   Intravenous Once  . amitriptyline  20 mg Oral QHS  . aspirin EC  81 mg Oral Q breakfast  . [START ON 11/07/2019] clopidogrel  75 mg Oral Daily  . collagenase   Topical Daily  . diltiazem  60 mg Oral TID  . [START ON 11/07/2019] ferrous sulfate  325 mg Oral Q breakfast  . heparin  5,000 Units Subcutaneous Q8H  . insulin aspart  0-15 Units Subcutaneous TID WC  . insulin aspart  0-5 Units Subcutaneous QHS  . insulin glargine  20 Units Subcutaneous QHS  . levothyroxine  100 mcg Oral Q breakfast  . multivitamin with minerals  1 tablet Oral Daily  . pantoprazole  40 mg Oral Daily  . pravastatin  10 mg Oral Q supper  . primidone  150 mg Oral QHS  . tobramycin  2 drop Both Eyes Q4H while awake   Continuous Infusions: . sodium chloride 50 mL/hr at  11/05/19 2202  . ceFEPime (MAXIPIME) IV 2 g (11/06/19 1303)  . diltiazem (CARDIZEM) infusion 15 mg/hr (11/05/19 1959)  . metronidazole Stopped (11/06/19 0802)  . vancomycin       LOS: 1 day   Critical Care Procedure Note Authorized and Performed by: Murvin Natal MD   Total Critical Care time:  36 mins  Due to a high probability of clinically significant, life threatening deterioration, the patient required my highest level of preparedness to intervene emergently and I personally spent this critical care time directly and personally managing the patient.  This critical care time included obtaining a history; examining the patient, pulse oximetry; ordering and review of studies; arranging urgent treatment with development of a management plan; evaluation of patient's response of treatment; frequent reassessment; and discussions with other providers.  This critical care time was performed to assess and manage the high probability of imminent and life threatening deterioration that could result in multi-organ failure.  It was exclusive of separately billable procedures and treating other patients and teaching time.   Irwin Brakeman, MD How to contact the [facility] Attending or Consulting provider [place] or covering provider during after hours [place], for this patient?  1. Check the care team in [facility] and look for a) attending/consulting [facility] provider listed and b) the [facility] team listed 2. Log into [email or web address] and use [employer] universal password to access. If you do not have the password, please contact the hospital operator. 3. Locate the [facility] provider you are looking for under [facility] and page to a number that you can be directly reached. 4. If you still have difficulty reaching the provider, please page the [facility] (Director on Call) for the Hospitalists listed on amion for assistance.  11/06/2019, 1:06 PM

## 2019-11-06 NOTE — Progress Notes (Signed)
*  PRELIMINARY RESULTS* Echocardiogram 2D Echocardiogram has been performed.  Samuel Germany 11/06/2019, 9:27 AM

## 2019-11-06 NOTE — Consult Note (Signed)
[facility] Nurse Consult Note: Reason for Consult:Left heel unstageable pressure injury.  Present on admission.  S/P Right BKA.  Wound type:pressure  unstageable Pressure Injury POA: Yes Measurement:Bedside RN to obtain measurements.   Wound bed: 25% yellow fibrin slough with pale pink nongranulating wound bed  Drainage (amount, consistency, odor) minimal serosanguinous   Periwound:intact Dressing procedure/placement/frequency:Cleanse left heel with NS and pat dry.  Apply Santyl to wound bed. Cover with NS moist gauze. Secure with  Dry gauze and kerlix/tape.  Change daily.  Will not follow at this time.  Please re-consult if needed.  Domenic Moras MSN, RN, FNP-BC CWON Wound, Ostomy, Continence Nurse Pager [phone]

## 2019-11-06 NOTE — ED Notes (Signed)
Report to Reader, [place]

## 2019-11-06 NOTE — Progress Notes (Signed)
RN noted order for Transfusion on RBC ordered 9/17 0439. Unable to verify whether or not transfusion occurred at other facility prior to Pt transport to this unit. Message sent to Twanna Hy, MD to notify and follow up with need for transfusion. Received new order for Hemoglobin and hematocrit lab draw.   RN notified provider of Hgb and Hct result. Received callback from Twanna Hy, MD with verbal okay to transufse 1 unit of PRBC.    Type and Screen ordered at [place].

## 2019-11-06 NOTE — ED Notes (Signed)
CRITICAL VALUE ALERT  Critical Value:  Hemoglobin 6.9  Date & Time Notied:  11/06/2019 0404  Provider Notified: Dr. Clearence Ped  Orders Received/Actions taken: see chart

## 2019-11-06 NOTE — ED Notes (Signed)
Report to Irena Cords

## 2019-11-06 NOTE — Consult Note (Addendum)
Cardiology Consultation:   Patient ID: Anthony Dunlap; [id number]; 1933-09-09   Admit date: 11/05/2019 Date of Consult: 11/06/2019  Primary Care Provider: [name], MD Primary Cardiologist: Carlyle Dolly, MD New Primary Electrophysiologist:  None   Patient Profile:   Anthony Dunlap is a 86 y.o. 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  History of Present Illness:   Anthony Dunlap had balloon angioplasty of his left tibial artery 08/19/2019.  He was admitted 8/6-8/13/2021 for a large R heel ulcer, subsequently had right BKA.  He came back to the hospital 9/16 with fever (doxycycline stopped 8 days ago), possible sepsis, atrial fibrillation RVR and a small pericardial effusion felt exudative.  He is also anemic with a hemoglobin today of 6.9.  Calcium 7.6, albumin 2.2, total protein 6.3.  Initial potassium 3.2, sodium 127 but improved overnight.  Platelets 622, Mg low-nl at 1.7.  Hemoglobin A1c 8.5.  Transfusion is ordered.  Cardiology is asked to see for the atrial fibrillation.  Anthony Dunlap is not aware of the atrial fibrillation.  He denies any history of palpitations.  He denies any history of presyncope or syncope.  From a heart standpoint, he does not feel any different now that his heart rate is controlled than he did when he got here.  He has not had chest pain.  He feels his breathing is about the same now as it was when he got out of the hospital.  His wife of 22 years died recently, he is struggling with this.  He currently lives alone, with a home health nurse coming in several times a week.   Past Medical History:  Diagnosis Date  . Chronic heel ulcer, left, limited to breakdown of skin ([place]) 10/28/2019  . 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 Diabetes ([place])   . Drug-induced hepatitis 06/05/2018  . Inguinal hernia    06/09/2019: per patient " a long time ago on the right side"  . Left eye pain   . Localized osteoarthrosis of right hip 01/05/2019  . Prostate cancer [facility])     Past Surgical History:  Procedure Laterality Date  . ABDOMINAL AORTOGRAM W/LOWER EXTREMITY Bilateral 08/12/2019   Procedure: ABDOMINAL AORTOGRAM W/LOWER EXTREMITY;  Surgeon: Marty Heck, MD;  Location: [place] [facility];  Service: Cardiovascular;  Laterality: Bilateral;  . ABDOMINAL AORTOGRAM W/LOWER EXTREMITY Left 08/19/2019   Procedure: ABDOMINAL AORTOGRAM W/LOWER EXTREMITY;  Surgeon: Marty Heck, MD;  Location: [place] [facility];  Service: Cardiovascular;  Laterality: Left;  . AMPUTATION Right 09/28/2019   Procedure: AMPUTATION BELOW KNEE;  Surgeon: Rosetta Posner, MD;  Location: [place];  Service: Vascular;  Laterality: Right;  . HERNIA REPAIR    . IR RADIOLOGIST EVAL & MGMT  03/06/2018  . PERIPHERAL VASCULAR BALLOON ANGIOPLASTY Left 08/19/2019   Procedure: PERIPHERAL VASCULAR BALLOON ANGIOPLASTY;  Surgeon: Marty Heck, MD;  Location: [place] [facility];  Service: Cardiovascular;  Laterality: Left;  . PROSTATE SURGERY    . TOTAL HIP ARTHROPLASTY Right 06/12/2019   Procedure: RIGHT TOTAL HIP ARTHROPLASTY DIRECT ANTERIOR;  Surgeon: Marybelle Killings, MD;  Location: [place];  Service: Orthopedics;  Laterality: Right;     Prior to Admission medications   Medication Sig Start Date End Date  Taking? Authorizing Provider  acetaminophen (TYLENOL) 500 MG tablet Take 1,000 mg by mouth every 6 (six) hours as needed for mild pain or moderate pain.   Yes [provider]  ALPRAZolam (XANAX) 0.5 MG tablet Take 1 tablet (0.5 mg total) by mouth 2 (two) times daily as needed for anxiety. 09/30/19   Geradine Girt, DO  amitriptyline (ELAVIL) 10 MG tablet Take 20 mg by mouth at bedtime.  11/28/18   [provider]   amoxicillin-clavulanate (AUGMENTIN) 875-125 MG tablet TAKE 1 TABLET BY MOUTH TWICE DAILY Patient taking differently: Take 1 tablet by mouth 2 (two) times daily.  06/12/19   Truman Hayward, MD  aspirin EC 81 MG tablet Take 81 mg by mouth daily with breakfast. Swallow whole.     [provider]  clopidogrel (PLAVIX) 75 MG tablet Take 1 tablet (75 mg total) by mouth daily. Patient not taking: Reported on 10/28/2019 08/19/19 08/18/20  Marty Heck, MD  colchicine 0.6 MG tablet Take 0.6 mg by mouth daily as needed (gout attacks).  07/27/19   [provider]  ferrous sulfate 325 (65 FE) MG tablet Take 1 tablet (325 mg total) by mouth 2 (two) times daily with a meal. 09/30/19   Geradine Girt, DO  furosemide (LASIX) 20 MG tablet Take 1 tablet (20 mg total) by mouth daily with breakfast. 10/02/19   Geradine Girt, DO  glimepiride (AMARYL) 4 MG tablet Take 4 mg by mouth daily with breakfast.  04/18/18   [provider]  insulin aspart (NOVOLOG) 100 UNIT/ML injection Inject 0-15 Units into the skin 3 (three) times daily with meals. Sliding scale insulin Less than 70 initiate hypoglycemia protocol 70-120  0 units 120-150 2 unit 151-200 3 units 201-250 3 units 251-300 5 units 301-350 8 units 351-400 11 units  Greater than 400 15 units , call MD Patient taking differently: Inject 0-16 Units into the skin See admin instructions. Inject 0-16 units subcutaneously three times daily with meals per sliding scale: CBG 70-120 0 units 120-150 4 units, 151-200 5 units, 201-250 6 units, 251-300 7 units, 301-350 10 units, 351-400 13 units, >400 16 units and call MD (Less than 70 initiate hypoglycemia protocol) 02/18/18   Oswald Hillock, MD  Insulin Glargine-Lixisenatide (SOLIQUA) 100-33 UNT-MCG/ML SOPN Inject 40 Units into the skin daily after breakfast.    [provider]  levothyroxine (SYNTHROID, LEVOTHROID) 100 MCG tablet Take 100 mcg by mouth daily with breakfast.  04/18/18    [provider]  Multiple Vitamin (MULTIVITAMIN WITH MINERALS) TABS tablet Take 1 tablet by mouth daily. 09/30/19   Geradine Girt, DO  nutrition supplement, JUVEN, (JUVEN) PACK Take 1 packet by mouth 2 (two) times daily between meals. 09/30/19   Geradine Girt, DO  Nutritional Supplements (,FEEDING SUPPLEMENT, PROSOURCE PLUS) liquid Take 30 mLs by mouth 2 (two) times daily with a meal. 09/30/19   Geradine Girt, DO  nystatin (MYCOSTATIN/NYSTOP) powder Apply topically 4 (four) times daily. Patient taking differently: Apply 1 application topically daily as needed (yeast).  03/03/19   Truman Hayward, MD  oxyCODONE (OXY IR/ROXICODONE) 5 MG immediate release tablet Take 1 tablet (5 mg total) by mouth every 4 (four) hours as needed for moderate pain. 09/30/19   Geradine Girt, DO  pantoprazole (PROTONIX) 40 MG tablet Take 1 tablet (40 mg total) by mouth daily. 09/30/19   Geradine Girt, DO  pravastatin (PRAVACHOL) 10 MG tablet Take 10 mg by  mouth daily with supper.     [provider]  primidone (MYSOLINE) 50 MG tablet Take 150 mg by mouth at bedtime.  02/26/19   [provider]  senna-docusate (SENOKOT-S) 8.6-50 MG tablet Take 1 tablet by mouth 2 (two) times daily. 09/30/19   Geradine Girt, DO  silver sulfADIAZINE (SILVADENE) 1 % cream Apply topically 2 (two) times daily. 09/30/19   Geradine Girt, DO    Inpatient Medications: Scheduled Meds: . sodium chloride   Intravenous Once  . amitriptyline  20 mg Oral QHS  . aspirin EC  81 mg Oral Q breakfast  . heparin  5,000 Units Subcutaneous Q8H  . insulin aspart  0-15 Units Subcutaneous TID WC  . insulin aspart  0-5 Units Subcutaneous QHS  . insulin glargine  20 Units Subcutaneous QHS  . levothyroxine  100 mcg Oral Q breakfast  . multivitamin with minerals  1 tablet Oral Daily  . pantoprazole  40 mg Oral Daily  . pravastatin  10 mg Oral Q supper  . primidone  150 mg Oral QHS  . tobramycin  2 drop Both Eyes Q4H while  awake   Continuous Infusions: . sodium chloride 50 mL/hr at 11/05/19 2202  . ceFEPime (MAXIPIME) IV Stopped (11/06/19 0802)  . diltiazem (CARDIZEM) infusion 15 mg/hr (11/05/19 1959)  . metronidazole Stopped (11/06/19 0802)  . vancomycin     PRN Meds: acetaminophen **OR** acetaminophen, colchicine, HYDROcodone-acetaminophen  Allergies:    Allergies  Allergen Reactions  . Fluconazole Other (See Comments)    Drug-induced hepatitis    Social History:   Social History   Socioeconomic History  . Marital status: Widowed    Spouse name: [name]  . Number of children: 3  . Years of education: 9th  . Highest education level: Not on file  Occupational History    Employer: RETIRED    Comment: Retired  Tobacco Use  . Smoking status: Never Smoker  . Smokeless tobacco: Never Used  Vaping Use  . Vaping Use: Never used  Substance and Sexual Activity  . Alcohol use: No  . Drug use: No  . Sexual activity: Not on file  Other Topics Concern  . Not on file  Social History Narrative   Patient lives at home with his wife. [name]) . Patient is retired.   Education 9th grade.   Right handed.   Caffeine None   Social Determinants of Health   Financial Resource Strain:   . Difficulty of Paying Living Expenses: Not on file  Food Insecurity:   . Worried About Charity fundraiser in the Last Year: Not on file  . Ran Out of Food in the Last Year: Not on file  Transportation Needs:   . Lack of Transportation (Medical): Not on file  . Lack of Transportation (Non-Medical): Not on file  Physical Activity:   . Days of Exercise per Week: Not on file  . Minutes of Exercise per Session: Not on file  Stress:   . Feeling of Stress : Not on file  Social Connections:   . Frequency of Communication with Friends and Family: Not on file  . Frequency of Social Gatherings with Friends and Family: Not on file  . Attends Religious Services: Not on file  . Active Member of Clubs or Organizations:  Not on file  . Attends Archivist Meetings: Not on file  . Marital Status: Not on file  Intimate Partner Violence:   . Fear of Current or Ex-Partner:  Not on file  . Emotionally Abused: Not on file  . Physically Abused: Not on file  . Sexually Abused: Not on file    Family History:   Family History  Problem Relation Age of Onset  . Pneumonia Father   . Cancer Sister    Family Status:  Family Status  Relation Name Status  . Mother  Deceased at age 14       natural causes  . Father  Deceased at age 49  . Sister  (Not Specified)    ROS:  Please see the history of present illness.  All other ROS reviewed and negative.     Physical Exam/Data:   Vitals:   11/06/19 0200 11/06/19 0215 11/06/19 0300 11/06/19 0530  BP: 111/64   103/75  Pulse: (!) 41 85 63 78  Resp:  (!) 22 18 (!) 23  Temp: 99 F (37.2 C) 99 F (37.2 C) 97.7 F (36.5 C) 98.1 F (36.7 C)  TempSrc:      SpO2:  94% 90% 95%  Weight:      Height:        Intake/Output Summary (Last 24 hours) at 11/06/2019 0857 Last data filed at 11/06/2019 0802 Gross per 24 hour  Intake 4992.98 ml  Output --  Net 4992.98 ml    Last 3 Weights 11/05/2019 10/20/2019 09/25/2019  Weight (lbs) 170 lb 170 lb 172 lb  Weight (kg) 77.111 kg 77.111 kg 78.019 kg     Body mass index is 25.85 kg/m.   General:  Well nourished, well developed, male in no acute distress HEENT: normal Lymph: no adenopathy Neck: JVD - not seen elevated, but difficult to assess Endocrine:  No thryomegaly Vascular: No carotid bruits; upper extremity pulses 2+, s/p R BKA, L distal pulses not felt but cap refill wnl Cardiac:  normal S1, S2; RRR; no murmur Lungs:  Scattered rales bilaterally, no wheezing, rhonchi Abd: soft, nontender, no hepatomegaly  Ext: no edema Musculoskeletal:  No deformities, BUE and BLE strength weak but equal Skin: warm and dry  Neuro:  CNs 2-12 intact, no focal abnormalities noted Psych:  Normal affect   EKG:  The EKG  was personally reviewed and demonstrates:  Atrial fib, RVR, HR 144 Telemetry:  Telemetry was personally reviewed and demonstrates:  Atrial fib, controlled rate (on IV Cardizem)   CV studies:   ECHO: Ordered  ECHO: 01/23/2018 - Left ventricle: The cavity size was normal. Wall thickness was  increased in a pattern of mild LVH. Systolic function was normal.  The estimated ejection fraction was in the range of 60% to 65%.  Wall motion was normal; there were no regional wall motion  abnormalities. Doppler parameters are consistent with abnormal  left ventricular relaxation (grade 1 diastolic dysfunction).  - Aortic valve: Mildly calcified annulus. Trileaflet; mildly  calcified leaflets.  - Mitral valve: Mildly calcified annulus. There was trivial  regurgitation.  - Right atrium: Central venous pressure (est): 3 mm Hg.  - Tricuspid valve: Nodular thickening. There was mild  regurgitation.  - Pulmonary arteries: PA peak pressure: 37 mm Hg (S).  - Pericardium, extracardiac: There was no pericardial effusion.   Laboratory Data:   Chemistry Recent Labs  Lab 11/05/19 1203 11/06/19 0303  NA 127* 132*  K 3.2* 4.2  CL 95* 105  CO2 19* 21*  GLUCOSE 139* 152*  BUN 23 18  CREATININE 1.04 0.87  CALCIUM 8.2* 7.6*  GFRNONAA >60 >60  GFRAA >60 >60  ANIONGAP 13 6  Lab Results  Component Value Date   ALT 29 11/06/2019   AST 28 11/06/2019   GGT 14 01/04/2018   ALKPHOS 131 (H) 11/06/2019   BILITOT 0.6 11/06/2019   Hematology Recent Labs  Lab 11/05/19 1203 11/06/19 0303  WBC 11.0* 10.5  RBC 2.97* 2.74*  HGB 7.4* 6.9*  HCT 24.4* 22.8*  MCV 82.2 83.2  MCH 24.9* 25.2*  MCHC 30.3 30.3  RDW 14.5 14.6  PLT 622* 606*   Cardiac Enzymes High Sensitivity Troponin:  No results for input(s): TROPONINIHS in the last 720 hours.    BNPNo results for input(s): BNP, PROBNP in the last 168 hours.   TSH:  Lab Results  Component Value Date   TSH 6.382 (H) 02/11/2018    Lipids:No results found for: CHOL, HDL, LDLCALC, LDLDIRECT, TRIG, CHOLHDL HgbA1c: Lab Results  Component Value Date   HGBA1C 8.5 (H) 11/05/2019   Magnesium:  Magnesium  Date Value Ref Range Status  11/06/2019 1.7 1.7 - 2.4 mg/dL Final    Comment:    Performed at [facility], [address], [place], [place] [address]     Radiology/Studies:  CT Abdomen Pelvis W Contrast  Result Date: 11/05/2019 CLINICAL DATA:  Bowel obstruction, pubic symphyseal osteomyelitis EXAM: CT ABDOMEN AND PELVIS WITH CONTRAST TECHNIQUE: Multidetector CT imaging of the abdomen and pelvis was performed using the standard protocol following bolus administration of intravenous contrast. CONTRAST:  120mL OMNIPAQUE IOHEXOL 300 MG/ML  SOLN COMPARISON:  None. FINDINGS: Lower chest: Small bilateral pleural effusions are present with associated mild bibasilar compressive atelectasis. Small pericardial effusion is present, new since prior examination, with associated pericardial enhancement suggesting a complex/exudative effusion. Cardiac size is within normal limits. No CT evidence of cardiac tamponade. Extensive distal right coronary artery calcification. Hepatobiliary: No focal liver abnormality is seen. No gallstones, gallbladder wall thickening, or biliary dilatation. Pancreas: Moderate atrophy, but otherwise unremarkable. Spleen: Unremarkable Adrenals/Urinary Tract: The adrenal glands are unremarkable. The kidneys are normal in size and position. Tiny exophytic cortical cyst within the upper pole of the left kidney. The kidneys are otherwise unremarkable. The bladder is decompressed with a Foley catheter balloon seen within its lumen. Stomach/Bowel: Stomach and small bowel are unremarkable. The sigmoid colon is markedly redundant, however, there is no evidence of obstruction or focal inflammation. Moderate stool within the ascending and transverse colon. The appendix is not visualized and is likely absent. There is no free  intraperitoneal gas or fluid. Vascular/Lymphatic: No pathologic adenopathy within the abdomen and pelvis. There is mild aortoiliac atherosclerotic calcification noted without evidence of aneurysm. Particularly prominent calcification, however, is seen at the origin of the mesenteric and renal vasculature, though the degree of stenosis is not well assessed on this non arteriographic study. Reproductive: Brachytherapy seeds are seen within the prostate gland. Other: Small right inguinal hernia is present containing portions of a distal small bowel and the cecum as well as a small amount of mesenteric fat. Tiny fat containing left inguinal hernia is present. Musculoskeletal: Right total hip arthroplasty has been performed. Moderate left hip degenerative arthritis. Mixed lytic and sclerotic changes involving the pubic symphyses are compatible with the given history of symphyseal osteomyelitis. Calcifications seen at the a hamstring origins likely relates to remote trauma or inflammation. Degenerative changes are seen within the lumbar spine. No lytic or blastic bone lesions are seen. IMPRESSION: 1. Small pericardial effusion with associated pericardial enhancement suggesting a complex/exudative effusion. No CT evidence of cardiac tamponade. 2. Small bilateral pleural effusions. 3. Small right  inguinal hernia containing portions of a distal small bowel and cecum. No evidence of bowel obstruction. 4. 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 Aortic Atherosclerosis (ICD10-I70.0). Electronically Signed   By: Fidela Salisbury MD   On: 11/05/2019 15:44   DG Chest Port 1 View  Result Date: 11/05/2019 CLINICAL DATA:  New onset AFib EXAM: PORTABLE CHEST 1 VIEW COMPARISON:  June 09, 2019 FINDINGS: The cardiomediastinal silhouette is unchanged in contour when accounting for differences in technique.Trace fluid in the RIGHT minor fissure. No  pleural effusion. No pneumothorax. No acute pleuroparenchymal abnormality. Gaseous distension of the colon. Multilevel degenerative changes of the thoracic spine. IMPRESSION: No acute cardiopulmonary abnormality. Electronically Signed   By: Valentino Saxon MD   On: 11/05/2019 12:26    Assessment and Plan:   1. Persistent atrial fib: -Unknown duration -Good rate control on Cardizem IV, currently at 15 mg an hour -Continue this for now, SBP 100s so may need to titrate it down -Changed to p.o. once patient otherwise more stable -CHA2DS2-VASc = 4 (age x 2, PAD, DM) -Anticoagulation is indicated, but with anemia and sepsis, would defer oral anticoagulants for now. -He is currently on DVT Heparin, MD advise if we should change to full-strength Lovenox or heparin drip.  2.  Pericardial effusion -See CT report above, based on characteristics, the effusion was felt more likely exudative and is small. -Follow-up on echo, currently being performed  3. Sepsis -He is currently being treated for osteomyelitis of the right heel, on vancomycin and cefepime -Per IM  Otherwise, per IM Active Problems:   HLD (hyperlipidemia)   Chronic anemia   Hypothyroidism   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 ([place])   Sepsis ([place])     For questions or updates, please contact [place] Please consult [email or web address] for contact info under Cardiology/STEMI.   Jonetta Speak, PA-C  11/06/2019 8:57 AM  Attending note Patient seen and discussed with PA Barrett, I agree with her documentaiton.  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 In ER found to be in afib with RVR,    Lactic acid 1.3-->2.1--> 1.9 K 3.2 Cr 1.04 BUN 23 Na 127 Alb 2.6  WBC 11 Hgb 7.4 Plt 622 TSH 1.987  COVID neg  EKG AFib  RVR  Afib New diagnosis of afib,started on dilt gtt. Would avoid anticoag given significant anemia, he is being transfused today.  - rate controlled on dilt gtt, we will start oral dilt and wean drip   Anemia - Hgb 7.4 on admission, down to 6.9 - management per primary team - avoid anticoag at this time regarding his afb  Pericardial effusion - small effusion by CT scan with report indicating possible complex/exudative effusion. Also with small bilateral pleural effusions not impressive at all by echo, though limited visualiation in the subcostal windows.  - at this time no further workup recommended for trivial/circumferential effusion.  - dont need to hold plavix for effusion, defer to primary team if want to hold for anemia. He is on it for PAD history, no cardiac indication.   Sepsis - per primary team  Carlyle Dolly MD

## 2019-11-06 NOTE — ED Notes (Signed)
Wound to heel (5.5 cm long x 3.75 cm wide and  2cm deep) cleansed with NS, ointment application, covered with damp dressing covered with dry and wrapped in kerlix

## 2019-11-07 DIAGNOSIS — I739 Peripheral vascular disease, unspecified: Secondary | ICD-10-CM

## 2019-11-07 DIAGNOSIS — D508 Other iron deficiency anemias: Secondary | ICD-10-CM

## 2019-11-07 DIAGNOSIS — E08621 Diabetes mellitus due to underlying condition with foot ulcer: Secondary | ICD-10-CM

## 2019-11-07 DIAGNOSIS — M86659 Other chronic osteomyelitis, unspecified thigh: Secondary | ICD-10-CM

## 2019-11-07 DIAGNOSIS — Z89511 Acquired absence of right leg below knee: Secondary | ICD-10-CM

## 2019-11-07 DIAGNOSIS — T50905A Adverse effect of unspecified drugs, medicaments and biological substances, initial encounter: Secondary | ICD-10-CM

## 2019-11-07 DIAGNOSIS — I4891 Unspecified atrial fibrillation: Secondary | ICD-10-CM

## 2019-11-07 DIAGNOSIS — L97428 Non-pressure chronic ulcer of left heel and midfoot with other specified severity: Secondary | ICD-10-CM

## 2019-11-07 DIAGNOSIS — F0390 Unspecified dementia without behavioral disturbance: Secondary | ICD-10-CM

## 2019-11-07 DIAGNOSIS — R8281 Pyuria: Secondary | ICD-10-CM

## 2019-11-07 DIAGNOSIS — E039 Hypothyroidism, unspecified: Secondary | ICD-10-CM

## 2019-11-07 DIAGNOSIS — K716 Toxic liver disease with hepatitis, not elsewhere classified: Secondary | ICD-10-CM

## 2019-11-07 LAB — CBC WITH DIFFERENTIAL/PLATELET
Abs Immature Granulocytes: 0.05 10*3/uL (ref 0.00–0.07)
Basophils Absolute: 0.1 10*3/uL (ref 0.0–0.1)
Basophils Relative: 1 %
Eosinophils Absolute: 0.4 10*3/uL (ref 0.0–0.5)
Eosinophils Relative: 4 %
HCT: 27 % — ABNORMAL LOW (ref 39.0–52.0)
Hemoglobin: 8.3 g/dL — ABNORMAL LOW (ref 13.0–17.0)
Immature Granulocytes: 1 %
Lymphocytes Relative: 12 %
Lymphs Abs: 1.2 10*3/uL (ref 0.7–4.0)
MCH: 25.8 pg — ABNORMAL LOW (ref 26.0–34.0)
MCHC: 30.7 g/dL (ref 30.0–36.0)
MCV: 83.9 fL (ref 80.0–100.0)
Monocytes Absolute: 0.6 10*3/uL (ref 0.1–1.0)
Monocytes Relative: 6 %
Neutro Abs: 7.9 10*3/uL — ABNORMAL HIGH (ref 1.7–7.7)
Neutrophils Relative %: 76 %
Platelets: 681 10*3/uL — ABNORMAL HIGH (ref 150–400)
RBC: 3.22 MIL/uL — ABNORMAL LOW (ref 4.22–5.81)
RDW: 15 % (ref 11.5–15.5)
WBC: 10.3 10*3/uL (ref 4.0–10.5)
nRBC: 0 % (ref 0.0–0.2)

## 2019-11-07 LAB — GLUCOSE, CAPILLARY
Glucose-Capillary: 175 mg/dL — ABNORMAL HIGH (ref 70–99)
Glucose-Capillary: 183 mg/dL — ABNORMAL HIGH (ref 70–99)
Glucose-Capillary: 185 mg/dL — ABNORMAL HIGH (ref 70–99)
Glucose-Capillary: 185 mg/dL — ABNORMAL HIGH (ref 70–99)
Glucose-Capillary: 270 mg/dL — ABNORMAL HIGH (ref 70–99)

## 2019-11-07 LAB — TYPE AND SCREEN
ABO/RH(D): O POS
Antibody Screen: NEGATIVE
Unit division: 0

## 2019-11-07 LAB — BPAM RBC
Blood Product Expiration Date: 202110162359
ISSUE DATE / TIME: 202109171352
Unit Type and Rh: 5100

## 2019-11-07 LAB — PREPARE RBC (CROSSMATCH)

## 2019-11-07 LAB — BASIC METABOLIC PANEL
Anion gap: 10 (ref 5–15)
BUN: 15 mg/dL (ref 8–23)
CO2: 17 mmol/L — ABNORMAL LOW (ref 22–32)
Calcium: 7.9 mg/dL — ABNORMAL LOW (ref 8.9–10.3)
Chloride: 106 mmol/L (ref 98–111)
Creatinine, Ser: 0.98 mg/dL (ref 0.61–1.24)
GFR calc Af Amer: >60 mL/min (ref >60)
GFR calc non Af Amer: >60 mL/min (ref >60)
Glucose, Bld: 207 mg/dL — ABNORMAL HIGH (ref 70–99)
Potassium: 3.5 mmol/L (ref 3.5–5.1)
Sodium: 133 mmol/L — ABNORMAL LOW (ref 135–145)

## 2019-11-07 LAB — CK: Total CK: 68 U/L (ref 49–397)

## 2019-11-07 MED ORDER — SODIUM CHLORIDE 0.9 % IV SOLN
3.0000 g | Freq: Four times a day (QID) | INTRAVENOUS | Status: DC
  Administered 2019-11-07 – 2019-11-12 (×19): 3 g via INTRAVENOUS
  Filled 2019-11-07 (×3): qty 8
  Filled 2019-11-07 (×2): qty 3
  Filled 2019-11-07 (×3): qty 8
  Filled 2019-11-07: qty 3
  Filled 2019-11-07 (×5): qty 8
  Filled 2019-11-07: qty 3
  Filled 2019-11-07 (×3): qty 8
  Filled 2019-11-07: qty 3
  Filled 2019-11-07 (×4): qty 8

## 2019-11-07 MED ORDER — SODIUM CHLORIDE 0.9% IV SOLUTION: Freq: Once | INTRAVENOUS | Status: DC

## 2019-11-07 MED ORDER — PANTOPRAZOLE SODIUM 40 MG PO TBEC
40.0000 mg | DELAYED_RELEASE_TABLET | Freq: Every day | ORAL | Status: DC
  Administered 2019-11-08: 40 mg via ORAL
  Filled 2019-11-07: qty 1

## 2019-11-07 MED ORDER — METOPROLOL TARTRATE 25 MG PO TABS
25.0000 mg | ORAL_TABLET | Freq: Two times a day (BID) | ORAL | Status: DC
  Administered 2019-11-07 – 2019-11-10 (×7): 25 mg via ORAL
  Filled 2019-11-07 (×7): qty 1

## 2019-11-07 MED ORDER — SODIUM CHLORIDE 0.9 % IV SOLN
700.0000 mg | Freq: Every day | INTRAVENOUS | Status: DC
  Administered 2019-11-07 – 2019-11-11 (×5): 700 mg via INTRAVENOUS
  Filled 2019-11-07 (×8): qty 14

## 2019-11-07 NOTE — Evaluation (Signed)
Physical Therapy Evaluation Patient Details Name: Anthony Dunlap MRN: [id number] DOB: 1934/01/06 Today's Date: 11/07/2019   History of Present Illness  Patient is 86 year old male, admitted due to fever. Probable sepsis. 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 Wound on left heel. PMH includes: prostate ca, hernia, R THA.  Clinical Impression  Patient received in bed, resting. Agrees to PT assessment. Patient began moving LEs toward edge of bed and HR had jumped up to the 140-150s. Had patient stop and rest. HR fluctuating between 110-150s generally throughout session. RN arrived and administered medications to reduce HR. Patient required min assist for supine><sit. Assist to raise trunk to sitting and for initial sitting balance. Transfer not attempted this day due to HR and that he is NWB on L LE at this time due to heel wound and possible osteomyelitis. He was able to progress sitting balance to independent with supervision. Patient will continue to benefit from skilled PT while here to improve functional independence.        Follow Up Recommendations SNF    Equipment Recommendations  None recommended by PT;Other (comment) (TBD)    Recommendations for Other Services       Precautions / Restrictions Precautions Precautions: Fall Restrictions Weight Bearing Restrictions: Yes RLE Weight Bearing: (P) Non weight bearing LLE Weight Bearing: Non weight bearing Other Position/Activity Restrictions: NWB for now until determine what is going on with left heel wound      Mobility  Bed Mobility Overal bed mobility: Needs Assistance Bed Mobility: Supine to Sit;Sit to Supine     Supine to sit: Min assist Sit to supine: Min assist   General bed mobility comments: Required min assist to raise trunk to seated position and for initial sitting balance. HR elevated into 150s with minimal bed level activities. HR ranging from 110s-150s. Requires min assist to return to supine. Assist to position left foot on two  pillows to float heel  Transfers                 General transfer comment: not assessed due to HR and wound on heel  Ambulation/Gait             General Gait Details: not attempted this day  Stairs            Wheelchair Mobility    Modified Rankin (Stroke Patients Only)       Balance Overall balance assessment: Needs assistance Sitting-balance support: Feet supported;Bilateral upper extremity supported Sitting balance-Leahy Scale: Fair Sitting balance - Comments: initially required min assist for sitting balance, able to decrease to supervision                                     Pertinent Vitals/Pain Pain Assessment: No/denies pain    Home Living Family/patient expects to be discharged to:: Skilled nursing facility Living Arrangements: (P) Alone Available Help at Discharge: (P) Personal care attendant;Available PRN/intermittently Type of Home: (P) House Home Access: (P) Ramped entrance     Home Layout: (P) One level Home Equipment: (P) Bedside commode;Shower seat;Walker - 2 wheels;Cane - single point;Walker - 4 wheels;Grab bars - tub/shower Additional Comments: Patient's wife recently passed away. He reports he has been in rehab since right LE amputation.    Prior Function Level of Independence: (P) Needs assistance   Gait / Transfers Assistance Needed: (P) reports he transfers "with a board"     Comments: was able to  use sliding board to get to wheelchair in rehab     Hand Dominance   Dominant Hand: Right    Extremity/Trunk Assessment   Upper Extremity Assessment Upper Extremity Assessment: Defer to OT evaluation    Lower Extremity Assessment Lower Extremity Assessment: Overall WFL for tasks assessed    Cervical / Trunk Assessment Cervical / Trunk Assessment: Normal  Communication   Communication: No difficulties  Cognition Arousal/Alertness: Awake/alert Behavior During Therapy: WFL for tasks  assessed/performed Overall Cognitive Status: Within Functional Limits for tasks assessed                                        General Comments      Exercises     Assessment/Plan    PT Assessment Patient needs continued PT services  PT Problem List Decreased strength;Decreased mobility;Decreased balance;Decreased activity tolerance;Other (comment) (Tachycardia)       PT Treatment Interventions DME instruction;Therapeutic activities;Gait training;Therapeutic exercise;Patient/family education;Functional mobility training;Neuromuscular re-education;Balance training;Wheelchair mobility training    PT Goals (Current goals can be found in the Care Plan section)  Acute Rehab PT Goals Patient Stated Goal: to return to rehab PT Goal Formulation: With patient Time For Goal Achievement: 11/19/19 Potential to Achieve Goals: Good    Frequency Min 3X/week   Barriers to discharge Decreased caregiver support;Inaccessible home environment      Co-evaluation   Reason for Co-Treatment: For patient/therapist safety           AM-PAC PT "6 Clicks" Mobility  Outcome Measure Help needed turning from your back to your side while in a flat bed without using bedrails?: None Help needed moving from lying on your back to sitting on the side of a flat bed without using bedrails?: A Little Help needed moving to and from a bed to a chair (including a wheelchair)?: A Lot Help needed standing up from a chair using your arms (e.g., wheelchair or bedside chair)?: A Lot Help needed to walk in hospital room?: Total Help needed climbing 3-5 steps with a railing? : Total 6 Click Score: 13    End of Session   Activity Tolerance: Treatment limited secondary to medical complications (Comment) (HR and Left heel wound) Patient left: in bed;with bed alarm set;with call bell/phone within reach Nurse Communication: Mobility status PT Visit Diagnosis: Other abnormalities of gait and mobility  (R26.89);Difficulty in walking, not elsewhere classified (R26.2);Muscle weakness (generalized) (M62.81)    Time: 1103-1130 PT Time Calculation (min) (ACUTE ONLY): 27 min   Charges:   PT Evaluation $PT Eval Moderate Complexity: 1 Mod          Kimball Manske, PT, GCS 11/07/19,12:35 PM

## 2019-11-07 NOTE — Consult Note (Signed)
Date of Admission:  11/05/2019          Reason for Consult: Sepsis    Referring Provider: Dr Jamse Arn   Assessment:  1. Fevers and low blood pressure thought to be due to sepsis though complicated by him having atrial fibrillation with rapid ventricular response 2. Recent below the knee amputation for severe peripheral vascular disease with osteomyelitis in his calcaneus on the right 3. Deep ulcer on the left which could be evolving into an osteomyelitis and could be the source of his recent fevers 4. Pyuria in a patient with a chronic Foley catheter but cultures have been unrevealing 5. Stable pelvic osteomyelitis 6. Pericardial effusion which on 2D echocardiogram does not appear to be exudative 7. History of severe intertrigo with candidemia 8. History of fluconazole induced hepatitis 9. Dementia  Plan:  1. Simplify antibiotics to daptomycin and Unasyn 2. MRI of left foot when clinically stable 3. 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 sodium chloride   Intravenous Once  . sodium chloride   Intravenous Once  . amitriptyline  20 mg Oral QHS  . aspirin EC  81 mg Oral Q breakfast  . Chlorhexidine Gluconate Cloth  6 each Topical Daily  . clopidogrel  75 mg Oral Daily  . collagenase   Topical Daily  . ferrous sulfate  325 mg Oral Q breakfast  . heparin  5,000 Units Subcutaneous Q8H  . insulin aspart  0-15 Units Subcutaneous TID WC  . insulin aspart  0-5 Units Subcutaneous QHS  . insulin glargine  20 Units Subcutaneous QHS  . levothyroxine  100 mcg Oral Q breakfast  . metoprolol tartrate  25 mg Oral BID  . multivitamin with minerals  1 tablet Oral Daily  . pantoprazole  40 mg Oral Daily  . pravastatin  10 mg Oral Q supper  . primidone  150 mg Oral QHS  . tobramycin  2 drop Both Eyes Q4H  while awake   Continuous Infusions: . sodium chloride 50 mL/hr at 11/05/19 2202  . ceFEPime (MAXIPIME) IV 2 g (11/06/19 2356)  . diltiazem (CARDIZEM) infusion 15 mg/hr (11/07/19 0938)  . metronidazole 500 mg (11/07/19 0511)  . vancomycin Stopped (11/06/19 1619)   PRN Meds:.acetaminophen **OR** acetaminophen, colchicine, HYDROcodone-acetaminophen  HPI: Anthony Dunlap is a 86 y.o. male   33 yomalewith hx of polymicrobial infection with pelvic osteomyelitis of the pubic symphysis and abscess in the adductor sheath He had grown enterococcus faecalis that was sensitive to ampicillin and a fairly sensitive Klebsiella pneumoniae anda  bacteroides  After receiving parenteral antibiotics he was transitioned to chronic Augmentin.  His course was complicated by intertrigo with candidemia, complicated by Azole induced hepatitis  He is also had bad peripheral vascular disease with wounds.   He had some worsening of wound and I was called on a weekend and added doxycycline prior to his most recent admission.  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  He also has ulcer on opposite heel which is in danger of progressing.  He was residing in a skilled nursing facility.  I saw him only 10 days ago and discontinue the doxycycline as I did not see a clear indication for  this.  He apparently was having fevers at home and the home health nurse thought he was pale and did not look well and he was ultimately brought to [facility] via EMS.  In the ER he was found to be in atrial fibrillation with a rapid ventricular response into the 140s.  A CT the abdomen pelvis was done which showed what was thought to be an exudative pericardial effusion.  It also showed his known pubic symphysis osteomyelitis but without any new findings on imaging and that area.  He did have pyuria on examination of his nation of his  urine.  Urine cultures and blood cultures were taken.  Blood cultures have been sterile x2 days and the urine culture was not helpful as it grew multiple specimens.  He has been on broad-spectrum antibiotics in the form of vancomycin cefepime and metronidazole.  He has had a 2D echocardiogram that does not show evidence of a significant exudative pericardial effusion and this has been reviewed by formal consultation with cardiology.  He is seems to be stabilized.  He does suffer from dementia and cannot provide a perfect history to me.  I am concerned that his fevers could be coming from his left foot where he has a deep ulcer still and where he has been warned he may need a below the knee amputation by vascular surgery.  I would like to get an MRI of that site when he is stable to do so.  I would also alert vascular surgery to his admission and make sure that they can examine his wounds as well.  I will simplify his antibiotics to daptomycin and Unasyn for now.  Certainly if he has intervention were surgical specimens to be sent for culture it may be wise to in fact hold his antibiotics.  I will follow along closely tomorrow and Dr. Megan Salon will see him on Monday.   Review of Systems: Review of Systems  Unable to perform ROS: Dementia    Past Medical History:  Diagnosis Date  . Chronic heel ulcer, left, limited to breakdown of skin ([place]) 10/28/2019  . 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 Diabetes ([place])   . Drug-induced hepatitis 06/05/2018  . Inguinal hernia    06/09/2019: per patient " a long time ago on the right side"  . Left eye pain   . Localized osteoarthrosis of right hip 01/05/2019  . Prostate cancer [facility])     Social History   Tobacco Use  . Smoking status: Never Smoker  . Smokeless tobacco: Never Used  Vaping Use  . Vaping Use: Never used  Substance Use Topics  . Alcohol use: No  . Drug use: No    Family History   Problem Relation Age of Onset  . Pneumonia Father   . Cancer Sister    Allergies  Allergen Reactions  . Fluconazole Other (See Comments)    Drug-induced hepatitis    OBJECTIVE: Blood pressure 120/76, pulse (!) 124, temperature 98 F (36.7 C), temperature source Axillary, resp. rate 16, height 5\' 8"  (1.727 m), weight 79.7 kg, SpO2 96 %.  Physical Exam Constitutional:      General: He is not in acute distress.    Appearance: Normal appearance. He is well-developed. He is not ill-appearing or diaphoretic.  HENT:     Head: Normocephalic and atraumatic.     Right Ear: Hearing and external ear normal.     Left Ear: Hearing and external ear normal.  Nose: No nasal deformity or rhinorrhea.  Eyes:     General: No scleral icterus.    Conjunctiva/sclera: Conjunctivae normal.     Right eye: Right conjunctiva is not injected.     Left eye: Left conjunctiva is not injected.     Pupils: Pupils are equal, round, and reactive to light.  Neck:     Vascular: No JVD.  Cardiovascular:     Rate and Rhythm: Tachycardia present. Rhythm irregular.     Heart sounds: Normal heart sounds, S1 normal and S2 normal. No murmur heard.  No friction rub. No gallop.   Pulmonary:     Effort: Pulmonary effort is normal. No respiratory distress.     Breath sounds: Normal breath sounds. No stridor. No wheezing.  Chest:     Chest wall: No tenderness.  Abdominal:     General: Bowel sounds are normal. There is no distension.     Palpations: Abdomen is soft.     Tenderness: There is no abdominal tenderness.  Musculoskeletal:     Right shoulder: Normal.     Left shoulder: Normal.     Cervical back: Normal range of motion and neck supple.     Right hip: Normal.     Left hip: Normal.     Right knee: Normal.     Left knee: Normal.  Lymphadenopathy:     Head:     Right side of head: No submandibular, preauricular or posterior auricular adenopathy.     Left side of head: No submandibular, preauricular or  posterior auricular adenopathy.     Cervical: No cervical adenopathy.     Right cervical: No superficial or deep cervical adenopathy.    Left cervical: No superficial or deep cervical adenopathy.  Skin:    General: Skin is warm and dry.     Coloration: Skin is pale.     Findings: No abrasion, bruising, ecchymosis, erythema, lesion or rash.     Nails: There is no clubbing.  Neurological:     General: No focal deficit present.     Mental Status: He is alert and oriented to person, place, and time.     Sensory: No sensory deficit.     Coordination: Coordination normal.     Gait: Gait normal.  Psychiatric:        Attention and Perception: He is attentive.        Mood and Affect: Mood normal.        Speech: Speech is delayed.        Behavior: Behavior is slowed. Behavior is cooperative.        Cognition and Memory: Memory is impaired. He exhibits impaired recent memory and impaired remote memory.    Right amputation site with scar does not appear infected 11/07/2019:    Left ulcer pictured when I saw him 10 days ago in clinic followed by pictures from today     Picture from today of left heel ulcer      Lab Results Lab Results  Component Value Date   WBC 10.5 11/06/2019   HGB 7.0 (L) 11/06/2019   HCT 23.5 (L) 11/06/2019   MCV 83.2 11/06/2019   PLT 606 (H) 11/06/2019    Lab Results  Component Value Date   CREATININE 0.87 11/06/2019   BUN 18 11/06/2019   NA 132 (L) 11/06/2019   K 4.2 11/06/2019   CL 105 11/06/2019   CO2 21 (L) 11/06/2019    Lab Results  Component Value Date  ALT 29 11/06/2019   AST 28 11/06/2019   GGT 14 01/04/2018   ALKPHOS 131 (H) 11/06/2019   BILITOT 0.6 11/06/2019     Microbiology: Recent Results (from the past 240 hour(s))  Urine culture     Status: Abnormal   Collection Time: 11/05/19 11:55 AM   Specimen: In/Out Cath Urine  Result Value Ref Range Status   Specimen Description   Final    IN/OUT CATH URINE Performed at [facility], [address], [place], [place] [address]    Special Requests   Final    NONE Performed at [facility], [address], [place], [place] [address]    [place], SUGGEST RECOLLECTION (A)  Final   Report Status 11/06/2019 FINAL  Final  Blood Culture (routine x 2)     Status: None (Preliminary result)   Collection Time: 11/05/19 12:03 PM   Specimen: BLOOD  Result Value Ref Range Status   Specimen Description BLOOD  Final   Special Requests NONE  Final   Culture   Final    NO GROWTH 2 DAYS Performed at [facility], [address], [place], [place] [address]    Report Status PENDING  Incomplete  Blood Culture (routine x 2)     Status: None (Preliminary result)   Collection Time: 11/05/19 12:12 PM   Specimen: BLOOD  Result Value Ref Range Status   Specimen Description BLOOD  Final   Special Requests NONE  Final   Culture   Final    NO GROWTH 2 DAYS Performed at [facility], [address] [place], [place] [address]    Report Status PENDING  Incomplete  SARS Coronavirus 2 by RT PCR (hospital order, performed in [place] hospital lab) Nasopharyngeal Nasopharyngeal Swab     Status: None   Collection Time: 11/05/19 12:17 PM   Specimen: Nasopharyngeal Swab  Result Value Ref Range Status   SARS Coronavirus 2 NEGATIVE NEGATIVE Final    Comment: (NOTE) SARS-CoV-2 target nucleic acids are NOT DETECTED.  The SARS-CoV-2 RNA is generally detectable in upper and lower respiratory specimens during the acute phase of infection. The lowest concentration of SARS-CoV-2 viral copies this assay can detect is 250 copies / mL. A negative result does not preclude SARS-CoV-2 infection and should not be used as the sole basis for treatment or other patient management decisions.  A negative result may occur with improper specimen collection / handling, submission of specimen other than nasopharyngeal swab, presence of viral mutation(s) within the areas targeted by this  assay, and inadequate number of viral copies (<250 copies / mL). A negative result must be combined with clinical observations, patient history, and epidemiological information.  Fact Sheet for Patients:   StrictlyIdeas.no  Fact Sheet for Healthcare Providers: BankingDealers.co.za  This test is not yet approved or  cleared by the [place] FDA and has been authorized for detection and/or diagnosis of SARS-CoV-2 by FDA under an Emergency Use Authorization (EUA).  This EUA will remain in effect (meaning this test can be used) for the duration of the COVID-19 declaration under Section 564(b)(1) of the Act, 21 U.S.C. section 360bbb-3(b)(1), unless the authorization is terminated or revoked sooner.  Performed at [facility], [address], [place], [place] [address]     Alcide Evener, [facility] [phone] pager  11/07/2019, 10:15 AM

## 2019-11-07 NOTE — Progress Notes (Signed)
Pharmacy Antibiotic Note  Anthony Dunlap is a 86 y.o. male admitted on 11/05/2019 with osteomyelitis.  Pharmacy has been consulted for daptomycin and Unasyn dosing.   Plan: Start daptomycin 700 mg q24h Start Unasyn 3 grams q6h Discontinue vancomycin, cefepime, flagyl Monitor renal function, WBC, T, c/s, clinical improvement  Height: 5\' 8"  (172.7 cm) Weight: 79.7 kg (175 lb 11.3 oz) IBW/kg (Calculated) : 68.4  Temp (24hrs), Avg:98 F (36.7 C), Min:97.4 F (36.3 C), Max:99 F (37.2 C)  Recent Labs  Lab 11/05/19 1203 11/05/19 1421 11/05/19 1817 11/06/19 0303 11/07/19 1001  WBC 11.0*  --   --  10.5 10.3  CREATININE 1.04  --   --  0.87 0.98  LATICACIDVEN 1.3 2.1* 1.9  --   --     Estimated Creatinine Clearance: 54.3 mL/min (by C-G formula based on SCr of 0.98 mg/dL).    Allergies  Allergen Reactions  . Fluconazole Other (See Comments)    Drug-induced hepatitis    Antimicrobials this admission: Vanco 9/16 >> 9/18 Cefepime 9/16 >> 9/18 Flagyl 9/16 >> 9/18 Daptomycin 9/18 >> Unaysn 9/18 >>  Microbiology results: 9/16BCx: ngtd 9/16UCx: multiple spp, suggest recollection  Thank you for allowing pharmacy to be a part of this patient's care.  Fara Olden, PharmD PGY-1 Pharmacy Resident 11/07/2019 11:00 AM Please see AMION for all pharmacy numbers

## 2019-11-07 NOTE — Progress Notes (Signed)
Pt's HR sustaining in 120s-130s, Cardiology made aware. Diltiazem titrated up at 15mg /hr

## 2019-11-07 NOTE — Progress Notes (Signed)
   11/07/19 0935  Assess: MEWS Score  BP 124/81  Pulse Rate (!) 122  ECG Heart Rate (!) 130  Resp 20  SpO2 96 %  Assess: MEWS Score  MEWS Temp 0  MEWS Systolic 0  MEWS Pulse 3  MEWS RR 0  MEWS LOC 0  MEWS Score 3  MEWS Score Color Yellow  Assess: if the MEWS score is Yellow or Red  Were vital signs taken at a resting state? Yes  Focused Assessment No change from prior assessment  Early Detection of Sepsis Score *See Row Information* Low  MEWS guidelines implemented *See Row Information* Yes  Treat  MEWS Interventions Escalated (See documentation below)  Pain Scale 0-10  Pain Score 0  Take Vital Signs  Increase Vital Sign Frequency  Yellow: Q 2hr X 2 then Q 4hr X 2, if remains yellow, continue Q 4hrs  Escalate  MEWS: Escalate Yellow: discuss with charge nurse/RN and consider discussing with provider and RRT  Notify: Charge Nurse/RN  Name of Charge Nurse/RN Notified Nona Dell, RN  Date Charge Nurse/RN Notified 11/07/19  Time Charge Nurse/RN Notified 0940  Notify: Provider  Provider Name/Title Radford Pax, MD  Date Provider Notified 11/07/19  Time Provider Notified [phone]  Notification Type Face-to-face  Notification Reason Change in status (afib RVR, Turner, MD made aware)  Response See new orders (metoprolol added)  Date of Provider Response 11/07/19  Time of Provider Response 1003  Document  Patient Outcome Stabilized after interventions  Progress note created (see row info) Yes

## 2019-11-07 NOTE — Progress Notes (Signed)
Pt HR sustaining >110. Rates up to 130's sustaining. RN initiated Diltiazem drip to help with HR management. Per order will wean as able.

## 2019-11-07 NOTE — Progress Notes (Signed)
   11/07/19 0338  Assess: MEWS Score  Temp 97.7 F (36.5 C)  BP 131/72  Pulse Rate (!) 110  ECG Heart Rate (!) 119  Resp 16  Level of Consciousness Alert  SpO2 97 %  O2 Device Room Air  Assess: MEWS Score  MEWS Temp 0  MEWS Systolic 0  MEWS Pulse 2  MEWS RR 0  MEWS LOC 0  MEWS Score 2  MEWS Score Color Yellow  Assess: if the MEWS score is Yellow or Red  Were vital signs taken at a resting state? Yes  Focused Assessment No change from prior assessment  Early Detection of Sepsis Score *See Row Information* Low  MEWS guidelines implemented *See Row Information* Yes  Treat  MEWS Interventions Administered scheduled meds/treatments  Pain Scale 0-10  Pain Score 0  Notify: Charge Nurse/RN  Name of Charge Nurse/RN Notified [place] B,RN  Date Charge Nurse/RN Notified 11/07/19  Time Charge Nurse/RN Notified [phone]

## 2019-11-07 NOTE — Progress Notes (Addendum)
PROGRESS NOTE    Anthony Dunlap  XHB:[id number]  DOB: 06/25/1933  DOA: 11/05/2019 PCP: [name], MD Outpatient Specialists:   Hospital course:  86 year old with DM2, prostate cancer, severe PVD followed by [name] status post right BKA and history of hip osteomyelitis on chronic suppressive antibiotic therapy was admitted yesterday for fever and new onset A. fib with RVR.  Subjective:  Patient states he feels okay, denies fevers or chills, denies any change in pain.  Has no acute complaints or concerns.   Objective: Vitals:   11/07/19 1115 11/07/19 1122 11/07/19 1634 11/07/19 1650  BP: 137/76 137/76 101/79 121/67  Pulse:  (!) 125 83   Resp:  20 18 (!) 23  Temp:  98 F (36.7 C) 98.2 F (36.8 C)   TempSrc:  Oral Oral   SpO2:  99% 95%   Weight:      Height:        Intake/Output Summary (Last 24 hours) at 11/07/2019 1733 Last data filed at 11/07/2019 1500 Gross per 24 hour  Intake 2681.99 ml  Output 300 ml  Net 2381.99 ml   Filed Weights   11/05/19 1126 11/07/19 0616  Weight: 77.1 kg 79.7 kg     Exam:  General: Chronically ill-appearing man lying in bed in no acute distress.  Eyes: sclera anicteric, conjuctiva mild injection bilaterally CVS: S1-S2, regular  Respiratory:  decreased air entry bilaterally secondary to decreased inspiratory effort, rales at bases  GI: NABS, soft, NT  LE: He has an ulcer on left heel with fibrinous exudate that does not look superficially infected.  BKA on right. Neuro: grossly nonfocal.    Assessment & Plan:   Sepsis Very much appreciate Dr. Arlyss Queen note Will call vascular surgery per the his recommendations, source of sepsis thought to be most likely deep ulcer on the left. Antibiotics consolidated to daptomycin and Unasyn Will order MRI of left foot tomorrow once heart rate is better controlled  Atrial fibrillation with RVR Appreciate cardiology input Heart rate under better control with addition of Lopressor  25 twice daily Chads vas 2 is 4, hold anticoagulation until anemia work-up is completed  DM2 Blood sugar in the high 180s at present If continues persistently high overnight, will increase glargine and SSI dosage  Anemia Unclear etiology, anemia work-up pending  Pericardial effusion Does not appear exudative per 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   DVT prophylaxis: Subcu heparin Code Status: Full Family Communication: None present Disposition Plan:   Patient is from: Home  Anticipated Discharge Location: SNF  Barriers to Discharge: Sepsis  Is patient medically stable for Discharge: No   Consultants:  Cardiology  ID  Vascular surgery to be consulted in the morning  Procedures:  None  Antimicrobials:  Daptomycin  Unasyn   Data Reviewed:  Basic Metabolic Panel: Recent Labs  Lab 11/05/19 1203 11/06/19 0303 11/07/19 1001  NA 127* 132* 133*  K 3.2* 4.2 3.5  CL 95* 105 106  CO2 19* 21* 17*  GLUCOSE 139* 152* 207*  BUN 23 18 15   CREATININE 1.04 0.87 0.98  CALCIUM 8.2* 7.6* 7.9*  MG  --  1.7  --   PHOS  --  2.6  --    Liver Function Tests: Recent Labs  Lab 11/05/19 1203 11/06/19 0303  AST 43* 28  ALT 33 29  ALKPHOS 150*  131*  BILITOT 0.7 0.6  PROT 7.0 6.3*  ALBUMIN 2.6* 2.2*   No results for input(s): LIPASE, AMYLASE in the last 168 hours. No results for input(s): AMMONIA in the last 168 hours. CBC: Recent Labs  Lab 11/05/19 1203 11/06/19 0303 11/06/19 2054 11/07/19 1001  WBC 11.0* 10.5  --  10.3  NEUTROABS 8.6*  --   --  7.9*  HGB 7.4* 6.9* 7.0* 8.3*  HCT 24.4* 22.8* 23.5* 27.0*  MCV 82.2 83.2  --  83.9  PLT 622* 606*  --  681*   Cardiac Enzymes: Recent Labs  Lab 11/07/19 1001  CKTOTAL 68   BNP (last 3 results) No results for input(s): PROBNP in the last 8760  hours. CBG: Recent Labs  Lab 11/06/19 2144 11/07/19 0322 11/07/19 0752 11/07/19 1124 11/07/19 1633  GLUCAP 180* 175* 183* 185* 185*    Recent Results (from the past 240 hour(s))  Urine culture     Status: Abnormal   Collection Time: 11/05/19 11:55 AM   Specimen: In/Out Cath Urine  Result Value Ref Range Status   Specimen Description   Final    IN/OUT CATH URINE Performed at [facility], [address] [place], [place] [address]    Special Requests   Final    NONE Performed at [facility], [address], [place], [place] [address]    Culture MULTIPLE SPECIES PRESENT, SUGGEST RECOLLECTION (A)  Final   Report Status 11/06/2019 FINAL  Final  Blood Culture (routine x 2)     Status: None (Preliminary result)   Collection Time: 11/05/19 12:03 PM   Specimen: BLOOD  Result Value Ref Range Status   Specimen Description BLOOD  Final   Special Requests NONE  Final   Culture   Final    NO GROWTH 2 DAYS Performed at [facility], [address], [place], [place] [address]    Report Status PENDING  Incomplete  Blood Culture (routine x 2)     Status: None (Preliminary result)   Collection Time: 11/05/19 12:12 PM   Specimen: BLOOD  Result Value Ref Range Status   Specimen Description BLOOD  Final   Special Requests NONE  Final   Culture   Final    NO GROWTH 2 DAYS Performed at [facility], [address] [place], [place] [address]    Report Status PENDING  Incomplete  SARS Coronavirus 2 by RT PCR (hospital order, performed in [place] hospital lab) Nasopharyngeal Nasopharyngeal Swab     Status: None   Collection Time: 11/05/19 12:17 PM   Specimen: Nasopharyngeal Swab  Result Value Ref Range Status   SARS Coronavirus 2 NEGATIVE NEGATIVE Final    Comment: (NOTE) SARS-CoV-2 target nucleic acids are NOT DETECTED.  The SARS-CoV-2 RNA is generally detectable in upper and lower respiratory specimens during the acute phase of infection. The lowest concentration of SARS-CoV-2  viral copies this assay can detect is 250 copies / mL. A negative result does not preclude SARS-CoV-2 infection and should not be used as the sole basis for treatment or other patient management decisions.  A negative result may occur with improper specimen collection / handling, submission of specimen other than nasopharyngeal swab, presence of viral mutation(s) within the areas targeted by this assay, and inadequate number of viral copies (<250 copies / mL). A negative result must be combined with clinical observations, patient history, and epidemiological information.  Fact Sheet for Patients:   StrictlyIdeas.no  Fact Sheet for Healthcare Providers: BankingDealers.co.za  This test  is not yet approved or  cleared by the [place] and has been authorized for detection and/or diagnosis of SARS-CoV-2 by FDA under an Emergency Use Authorization (EUA).  This EUA will remain in effect (meaning this test can be used) for the duration of the COVID-19 declaration under Section 564(b)(1) of the Act, 21 U.S.C. section 360bbb-3(b)(1), unless the authorization is terminated or revoked sooner.  Performed at [facility], [address], [place], [place] [address]       Studies: ECHOCARDIOGRAM COMPLETE  Result Date: 11/06/2019    ECHOCARDIOGRAM REPORT   Patient Name:   Luverne D Fils Date of Exam: 11/06/2019 Medical Rec #:  [id number]      Height:       68.0 in Accession #:    [id number]     Weight:       170.0 lb Date of Birth:  [birth year]      BSA:          1.907 m Patient Age:    65 years       BP:           103/75 mmHg Patient Gender: M              HR:           78 bpm. Exam Location:  [facility] Procedure: 2D Echo, Cardiac Doppler and Color Doppler Indications:    Prostate Cancer  History:        Patient has prior history of Echocardiogram examinations, most                 recent 01/23/2018. Risk Factors:Dyslipidemia and Diabetes.                  Prostate Cancer.  Sonographer:    Alvino Chapel RCS Referring Phys: [id number] DAWOOD S [facility]  Sonographer Comments: Technically difficult study due to poor echo windows. IMPRESSIONS  1. Left ventricular ejection fraction, by estimation, is 60 to 65%. The left ventricle has normal function. Left ventricular endocardial border not optimally defined to evaluate regional wall motion. There is mild left ventricular hypertrophy. Left ventricular diastolic parameters are indeterminate.  2. Right ventricular systolic function is normal. The right ventricular size is normal.  3. Left atrial size was mildly dilated.  4. The pericardial effusion is circumferential.  5. The mitral valve is normal in structure. Trivial mitral valve regurgitation. No evidence of mitral stenosis.  6. The aortic valve was not well visualized. Aortic valve regurgitation is not visualized. No aortic stenosis is present. FINDINGS  Left Ventricle: Left ventricular ejection fraction, by estimation, is 60 to 65%. The left ventricle has normal function. Left ventricular endocardial border not optimally defined to evaluate regional wall motion. The left ventricular internal cavity size was normal in size. There is mild left ventricular hypertrophy. Left ventricular diastolic parameters are indeterminate. Right Ventricle: The right ventricular size is normal. No increase in right ventricular wall thickness. Right ventricular systolic function is normal. Left Atrium: Left atrial size was mildly dilated. Right Atrium: Right atrial size was normal in size. Pericardium: Trivial pericardial effusion is present. The pericardial effusion is circumferential. Mitral Valve: The mitral valve is normal in structure. Trivial mitral valve regurgitation. No evidence of mitral valve stenosis. Tricuspid Valve: The tricuspid valve is normal in structure. Tricuspid valve regurgitation is not demonstrated. No evidence of tricuspid stenosis. Aortic Valve: The aortic valve  was not well visualized. Aortic valve regurgitation is not visualized. No aortic stenosis is  present. Aortic valve mean gradient measures 3.3 mmHg. Aortic valve peak gradient measures 7.2 mmHg. Aortic valve area, by VTI measures 2.80 cm. Pulmonic Valve: The pulmonic valve was not well visualized. Pulmonic valve regurgitation is not visualized. No evidence of pulmonic stenosis. Aorta: The aortic root is normal in size and structure. Pulmonary Artery: Indeterminant PASP, inadequate TR jet. Venous: The inferior vena cava was not well visualized. IAS/Shunts: The interatrial septum was not well visualized.  LEFT VENTRICLE PLAX 2D LVIDd:         4.18 cm LVIDs:         2.44 cm LV PW:         1.28 cm LV IVS:        1.12 cm LVOT diam:     2.00 cm LV SV:         56 LV SV Index:   29 LVOT Area:     3.14 cm  RIGHT VENTRICLE RV S prime:     13.00 cm/s TAPSE (M-mode): 1.4 cm LEFT ATRIUM             Index       RIGHT ATRIUM           Index LA diam:        2.60 cm 1.36 cm/m  RA Area:     15.90 cm LA Vol (A2C):   57.8 ml 30.30 ml/m RA Volume:   38.20 ml  20.03 ml/m LA Vol (A4C):   58.6 ml 30.72 ml/m LA Biplane Vol: 59.0 ml 30.93 ml/m  AORTIC VALVE AV Area (Vmax):    2.36 cm AV Area (Vmean):   2.29 cm AV Area (VTI):     2.80 cm AV Vmax:           134.16 cm/s AV Vmean:          83.194 cm/s AV VTI:            0.199 m AV Peak Grad:      7.2 mmHg AV Mean Grad:      3.3 mmHg LVOT Vmax:         100.60 cm/s LVOT Vmean:        60.700 cm/s LVOT VTI:          0.178 m LVOT/AV VTI ratio: 0.89  AORTA Ao Root diam: 3.00 cm MITRAL VALVE                TRICUSPID VALVE MV Area (PHT): 3.61 cm     TR Peak grad:   22.3 mmHg MV Decel Time: 210 msec     TR Vmax:        236.00 cm/s MV E velocity: 123.00 cm/s                             SHUNTS                             Systemic VTI:  0.18 m                             Systemic Diam: 2.00 cm Carlyle Dolly MD Electronically signed by Carlyle Dolly MD Signature Date/Time: 11/06/2019/11:30:42  AM    Final      Scheduled Meds: . sodium chloride   Intravenous Once  . sodium chloride   Intravenous Once  . amitriptyline  20 mg Oral QHS  . aspirin EC  81 mg Oral Q breakfast  . Chlorhexidine Gluconate Cloth  6 each Topical Daily  . clopidogrel  75 mg Oral Daily  . collagenase   Topical Daily  . ferrous sulfate  325 mg Oral Q breakfast  . heparin  5,000 Units Subcutaneous Q8H  . insulin aspart  0-15 Units Subcutaneous TID WC  . insulin aspart  0-5 Units Subcutaneous QHS  . insulin glargine  20 Units Subcutaneous QHS  . levothyroxine  100 mcg Oral Q breakfast  . metoprolol tartrate  25 mg Oral BID  . multivitamin with minerals  1 tablet Oral Daily  . [START ON 11/08/2019] pantoprazole  40 mg Oral Daily  . pravastatin  10 mg Oral Q supper  . primidone  150 mg Oral QHS  . tobramycin  2 drop Both Eyes Q4H while awake   Continuous Infusions: . sodium chloride Stopped (11/06/19 2203)  . ampicillin-sulbactam (UNASYN) IV Stopped (11/07/19 1312)  . DAPTOmycin (CUBICIN)  IV 228 mL/hr at 11/07/19 1500  . diltiazem (CARDIZEM) infusion 15 mg/hr (11/07/19 1640)    Active Problems:   HLD (hyperlipidemia)   Chronic anemia   Hypothyroidism   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 ([place])   Sepsis ([place])     Yitzchok Carriger Derek Jack, [facility]  If 7PM-7AM, please contact night-coverage [email or web address] Password [facility] 11/07/2019, 5:33 PM    LOS: 2 days

## 2019-11-07 NOTE — Progress Notes (Signed)
Pt was participating in therapy at a SNF prior to admission. He reports having assist for ADL and using a transfer board since his R LE amputation. Evaluation limited to bed level/EOB due to afib with RVR/extreme tachycardia. Pt requires set up to max assist for ADL. Recommending returning to SNF for further rehab. Will follow acutely.   11/07/19 1200  OT Visit Information  Last OT Received On 11/07/19  Assistance Needed +2 (to attempt transfer)  PT/OT/SLP Co-Evaluation/Treatment Yes  Reason for Co-Treatment For patient/therapist safety  History of Present Illness Pt is a 86 yr old male admitted from SNF with sepsis and nonhealing L heel ulcer, afib with RVR. PMH: s/p right below knee amputation 09/28/2019, pelvic/right hip osteomyelitis, prostate ca, hernia, R total hip arthroplasty, dementia.   Precautions  Precautions Fall  Restrictions  Weight Bearing Restrictions Yes  RLE Weight Bearing NWB  LLE Weight Bearing NWB  Home Living  Family/patient expects to be discharged to: Private residence  Living Arrangements Alone  Available Help at Discharge Personal care attendant;Available PRN/intermittently  Type of [place] entrance  Home Layout One level  Bathroom Shower/Tub Walk-in Automotive engineer  Home Equipment [facility];Shower seat;Walker - 2 wheels;Cane - single point;Walker - 4 wheels;Grab bars - tub/shower  Additional Comments wife recently died, pt has been in rehab since his R BKA  Prior Function  Level of [place] Needs assistance  Gait / Transfers Assistance Needed reports he transfers "with a board"  Communication  Communication No difficulties  Pain Assessment  Pain Assessment No/denies pain  Cognition  Arousal/Alertness Awake/alert  Behavior During Therapy WFL for tasks assessed/performed  Overall Cognitive Status History of cognitive impairments - at baseline  Upper Extremity Assessment  Upper Extremity Assessment Overall WFL for  tasks assessed  Lower Extremity Assessment  Lower Extremity Assessment Defer to PT evaluation  ADL  Overall ADL's  Needs assistance/impaired  Eating/Feeding Independent  Grooming Min guard;Sitting  Upper Body Bathing Minimal assistance;Sitting  Lower Body Bathing Maximal assistance;Bed level  Upper Body Dressing  Minimal assistance;Sitting  Lower Body Dressing Maximal assistance;Bed level  Vision- History  Patient Visual Report No change from baseline  Bed Mobility  Overal bed mobility Needs Assistance  Bed Mobility Supine to Sit;Sit to Supine  Supine to sit Min assist  Sit to supine Supervision  Transfers  General transfer comment deferred  Balance  Overall balance assessment Needs assistance  Sitting balance-Leahy Scale Fair  OT - End of Session  Activity Tolerance Patient tolerated treatment well  Patient left in bed;with call bell/phone within reach;with bed alarm set  OT Assessment  OT Recommendation/Assessment Patient needs continued OT Services  OT Visit Diagnosis Muscle weakness (generalized) (M62.81)  OT Problem List Decreased strength;Impaired balance (sitting and/or standing);Decreased knowledge of use of DME or AE;Decreased cognition;Decreased safety awareness;Cardiopulmonary status limiting activity  Barriers to Discharge Decreased caregiver support  OT Plan  OT Frequency (ACUTE ONLY) Min 2X/week  OT Treatment/Interventions (ACUTE ONLY) Self-care/ADL training;DME and/or AE instruction;Patient/family education;Balance training;Therapeutic activities  AM-PAC OT "6 Clicks" Daily Activity Outcome Measure (Version 2)  Help from another person eating meals? 4  Help from another person taking care of personal grooming? 3  Help from another person toileting, which includes using toliet, bedpan, or urinal? 2  Help from another person bathing (including washing, rinsing, drying)? 2  Help from another person to put on and taking off regular upper body clothing? 3  Help from  another person to put on and  taking off regular lower body clothing? 2  6 Click Score 16  OT Recommendation  Follow Up Recommendations SNF;Supervision/Assistance - 24 hour  OT Equipment Other (comment) (defer to next venue)  Individuals Consulted  Consulted and Agree with Results and Recommendations Patient  Acute Rehab OT Goals  Patient Stated Goal return to rehab  OT Goal Formulation With patient  Time For Goal Achievement 11/21/19  Potential to Achieve Goals Good  OT Time Calculation  OT Start Time (ACUTE ONLY) 1109  OT Stop Time (ACUTE ONLY) 1130  OT Time Calculation (min) 21 min  OT General Charges  $OT Visit 1 Visit  OT Evaluation  $OT Eval Moderate Complexity 1 Mod  Written Expression  Dominant Hand Right  Nestor Lewandowsky, OTR/L Acute Rehabilitation Services Pager: [phone] Office: [phone]

## 2019-11-07 NOTE — Progress Notes (Addendum)
Progress Note  Patient Name: Anthony Dunlap Date of Encounter: 11/07/2019  Primary Cardiologist: Carlyle Dolly, MD   Subjective   HR remains elevated in afib with RVR.  Denies any chest pain or SOB.   Inpatient Medications    Scheduled Meds:  sodium chloride   Intravenous Once   sodium chloride   Intravenous Once   amitriptyline  20 mg Oral QHS   aspirin EC  81 mg Oral Q breakfast   Chlorhexidine Gluconate Cloth  6 each Topical Daily   clopidogrel  75 mg Oral Daily   collagenase   Topical Daily   diltiazem  60 mg Oral TID   ferrous sulfate  325 mg Oral Q breakfast   heparin  5,000 Units Subcutaneous Q8H   insulin aspart  0-15 Units Subcutaneous TID WC   insulin aspart  0-5 Units Subcutaneous QHS   insulin glargine  20 Units Subcutaneous QHS   levothyroxine  100 mcg Oral Q breakfast   multivitamin with minerals  1 tablet Oral Daily   pantoprazole  40 mg Oral Daily   pravastatin  10 mg Oral Q supper   primidone  150 mg Oral QHS   tobramycin  2 drop Both Eyes Q4H while awake   Continuous Infusions:  sodium chloride 50 mL/hr at 11/05/19 2202   ceFEPime (MAXIPIME) IV 2 g (11/06/19 2356)   diltiazem (CARDIZEM) infusion 15 mg/hr (11/07/19 0938)   metronidazole 500 mg (11/07/19 0511)   vancomycin Stopped (11/06/19 1619)   PRN Meds: acetaminophen **OR** acetaminophen, colchicine, HYDROcodone-acetaminophen   Vital Signs    Vitals:   11/07/19 0923 11/07/19 0933 11/07/19 0935 11/07/19 0936  BP: (!) 128/93 127/89 124/81 120/76  Pulse: (!) 131 (!) 132 (!) 122 (!) 124  Resp: (!) 28 (!) 28 20 16   Temp: 98 F (36.7 C)     TempSrc: Axillary     SpO2: 95% 97% 96% 96%  Weight:      Height:        Intake/Output Summary (Last 24 hours) at 11/07/2019 0946 Last data filed at 11/07/2019 0915 Gross per 24 hour  Intake 462 ml  Output 300 ml  Net 162 ml   Filed Weights   11/05/19 1126 11/07/19 0616  Weight: 77.1 kg 79.7 kg    Telemetry     Atrial fibrillation with RVR - Personally Reviewed  ECG    No new EKG to review - Personally Reviewed  Physical Exam   GEN: No acute distress.   Neck: No JVD Cardiac: irregularly irregular and tachy, no murmurs, rubs, or gallops.  Respiratory: Clear to auscultation bilaterally. GI: Soft, nontender, non-distended  MS: No edema; s/p right BKA Neuro:  Nonfocal  Psych: Normal affect   Labs    Chemistry Recent Labs  Lab 11/05/19 1203 11/06/19 0303  NA 127* 132*  K 3.2* 4.2  CL 95* 105  CO2 19* 21*  GLUCOSE 139* 152*  BUN 23 18  CREATININE 1.04 0.87  CALCIUM 8.2* 7.6*  PROT 7.0 6.3*  ALBUMIN 2.6* 2.2*  AST 43* 28  ALT 33 29  ALKPHOS 150* 131*  BILITOT 0.7 0.6  GFRNONAA >60 >60  GFRAA >60 >60  ANIONGAP 13 6     Hematology Recent Labs  Lab 11/05/19 1203 11/06/19 0303 11/06/19 2054  WBC 11.0* 10.5  --   RBC 2.97* 2.74*  --   HGB 7.4* 6.9* 7.0*  HCT 24.4* 22.8* 23.5*  MCV 82.2 83.2  --   MCH 24.9* 25.2*  --  MCHC 30.3 30.3  --   RDW 14.5 14.6  --   PLT 622* 606*  --     Cardiac EnzymesNo results for input(s): TROPONINI in the last 168 hours. No results for input(s): TROPIPOC in the last 168 hours.   BNPNo results for input(s): BNP, PROBNP in the last 168 hours.   DDimer No results for input(s): DDIMER in the last 168 hours.   Radiology    CT Abdomen Pelvis W Contrast  Result Date: 11/05/2019 CLINICAL DATA:  Bowel obstruction, pubic symphyseal osteomyelitis EXAM: CT ABDOMEN AND PELVIS WITH CONTRAST TECHNIQUE: Multidetector CT imaging of the abdomen and pelvis was performed using the standard protocol following bolus administration of intravenous contrast. CONTRAST:  192mL OMNIPAQUE IOHEXOL 300 MG/ML  SOLN COMPARISON:  None. FINDINGS: Lower chest: Small bilateral pleural effusions are present with associated mild bibasilar compressive atelectasis. Small pericardial effusion is present, new since prior examination, with associated pericardial enhancement  suggesting a complex/exudative effusion. Cardiac size is within normal limits. No CT evidence of cardiac tamponade. Extensive distal right coronary artery calcification. Hepatobiliary: No focal liver abnormality is seen. No gallstones, gallbladder wall thickening, or biliary dilatation. Pancreas: Moderate atrophy, but otherwise unremarkable. Spleen: Unremarkable Adrenals/Urinary Tract: The adrenal glands are unremarkable. The kidneys are normal in size and position. Tiny exophytic cortical cyst within the upper pole of the left kidney. The kidneys are otherwise unremarkable. The bladder is decompressed with a Foley catheter balloon seen within its lumen. Stomach/Bowel: Stomach and small bowel are unremarkable. The sigmoid colon is markedly redundant, however, there is no evidence of obstruction or focal inflammation. Moderate stool within the ascending and transverse colon. The appendix is not visualized and is likely absent. There is no free intraperitoneal gas or fluid. Vascular/Lymphatic: No pathologic adenopathy within the abdomen and pelvis. There is mild aortoiliac atherosclerotic calcification noted without evidence of aneurysm. Particularly prominent calcification, however, is seen at the origin of the mesenteric and renal vasculature, though the degree of stenosis is not well assessed on this non arteriographic study. Reproductive: Brachytherapy seeds are seen within the prostate gland. Other: Small right inguinal hernia is present containing portions of a distal small bowel and the cecum as well as a small amount of mesenteric fat. Tiny fat containing left inguinal hernia is present. Musculoskeletal: Right total hip arthroplasty has been performed. Moderate left hip degenerative arthritis. Mixed lytic and sclerotic changes involving the pubic symphyses are compatible with the given history of symphyseal osteomyelitis. Calcifications seen at the a hamstring origins likely relates to remote trauma or  inflammation. Degenerative changes are seen within the lumbar spine. No lytic or blastic bone lesions are seen. IMPRESSION: 1. Small pericardial effusion with associated pericardial enhancement suggesting a complex/exudative effusion. No CT evidence of cardiac tamponade. 2. Small bilateral pleural effusions. 3. Small right inguinal hernia containing portions of a distal small bowel and cecum. No evidence of bowel obstruction. 4. 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 Aortic Atherosclerosis (ICD10-I70.0). Electronically Signed   By: Fidela Salisbury MD   On: 11/05/2019 15:44   DG Chest Port 1 View  Result Date: 11/05/2019 CLINICAL DATA:  New onset AFib EXAM: PORTABLE CHEST 1 VIEW COMPARISON:  June 09, 2019 FINDINGS: The cardiomediastinal silhouette is unchanged in contour when accounting for differences in technique.Trace fluid in the RIGHT minor fissure. No pleural effusion. No pneumothorax. No acute pleuroparenchymal abnormality. Gaseous distension of the colon. Multilevel degenerative changes of the thoracic spine.  IMPRESSION: No acute cardiopulmonary abnormality. Electronically Signed   By: Valentino Saxon MD   On: 11/05/2019 12:26   ECHOCARDIOGRAM COMPLETE  Result Date: 11/06/2019    ECHOCARDIOGRAM REPORT   Patient Name:   Shravan D Witthuhn Date of Exam: 11/06/2019 Medical Rec #:  [id number]      Height:       68.0 in Accession #:    [id number]     Weight:       170.0 lb Date of Birth:  Mar 07, 1933      BSA:          1.907 m Patient Age:    86 years       BP:           103/75 mmHg Patient Gender: M              HR:           78 bpm. Exam Location:  [facility] Procedure: 2D Echo, Cardiac Doppler and Color Doppler Indications:    Prostate Cancer  History:        Patient has prior history of Echocardiogram examinations, most                 recent 01/23/2018. Risk Factors:Dyslipidemia and Diabetes.                 Prostate  Cancer.  Sonographer:    Alvino Chapel RCS Referring Phys: [id number] DAWOOD S Waldron Labs  Sonographer Comments: Technically difficult study due to poor echo windows. IMPRESSIONS  1. Left ventricular ejection fraction, by estimation, is 60 to 65%. The left ventricle has normal function. Left ventricular endocardial border not optimally defined to evaluate regional wall motion. There is mild left ventricular hypertrophy. Left ventricular diastolic parameters are indeterminate.  2. Right ventricular systolic function is normal. The right ventricular size is normal.  3. Left atrial size was mildly dilated.  4. The pericardial effusion is circumferential.  5. The mitral valve is normal in structure. Trivial mitral valve regurgitation. No evidence of mitral stenosis.  6. The aortic valve was not well visualized. Aortic valve regurgitation is not visualized. No aortic stenosis is present. FINDINGS  Left Ventricle: Left ventricular ejection fraction, by estimation, is 60 to 65%. The left ventricle has normal function. Left ventricular endocardial border not optimally defined to evaluate regional wall motion. The left ventricular internal cavity size was normal in size. There is mild left ventricular hypertrophy. Left ventricular diastolic parameters are indeterminate. Right Ventricle: The right ventricular size is normal. No increase in right ventricular wall thickness. Right ventricular systolic function is normal. Left Atrium: Left atrial size was mildly dilated. Right Atrium: Right atrial size was normal in size. Pericardium: Trivial pericardial effusion is present. The pericardial effusion is circumferential. Mitral Valve: The mitral valve is normal in structure. Trivial mitral valve regurgitation. No evidence of mitral valve stenosis. Tricuspid Valve: The tricuspid valve is normal in structure. Tricuspid valve regurgitation is not demonstrated. No evidence of tricuspid stenosis. Aortic Valve: The aortic valve was not well  visualized. Aortic valve regurgitation is not visualized. No aortic stenosis is present. Aortic valve mean gradient measures 3.3 mmHg. Aortic valve peak gradient measures 7.2 mmHg. Aortic valve area, by VTI measures 2.80 cm. Pulmonic Valve: The pulmonic valve was not well visualized. Pulmonic valve regurgitation is not visualized. No evidence of pulmonic stenosis. Aorta: The aortic root is normal in size and structure. Pulmonary Artery: Indeterminant PASP, inadequate TR jet. Venous: The inferior vena cava was  not well visualized. IAS/Shunts: The interatrial septum was not well visualized.  LEFT VENTRICLE PLAX 2D LVIDd:         4.18 cm LVIDs:         2.44 cm LV PW:         1.28 cm LV IVS:        1.12 cm LVOT diam:     2.00 cm LV SV:         56 LV SV Index:   29 LVOT Area:     3.14 cm  RIGHT VENTRICLE RV S prime:     13.00 cm/s TAPSE (M-mode): 1.4 cm LEFT ATRIUM             Index       RIGHT ATRIUM           Index LA diam:        2.60 cm 1.36 cm/m  RA Area:     15.90 cm LA Vol (A2C):   57.8 ml 30.30 ml/m RA Volume:   38.20 ml  20.03 ml/m LA Vol (A4C):   58.6 ml 30.72 ml/m LA Biplane Vol: 59.0 ml 30.93 ml/m  AORTIC VALVE AV Area (Vmax):    2.36 cm AV Area (Vmean):   2.29 cm AV Area (VTI):     2.80 cm AV Vmax:           134.16 cm/s AV Vmean:          83.194 cm/s AV VTI:            0.199 m AV Peak Grad:      7.2 mmHg AV Mean Grad:      3.3 mmHg LVOT Vmax:         100.60 cm/s LVOT Vmean:        60.700 cm/s LVOT VTI:          0.178 m LVOT/AV VTI ratio: 0.89  AORTA Ao Root diam: 3.00 cm MITRAL VALVE                TRICUSPID VALVE MV Area (PHT): 3.61 cm     TR Peak grad:   22.3 mmHg MV Decel Time: 210 msec     TR Vmax:        236.00 cm/s MV E velocity: 123.00 cm/s                             SHUNTS                             Systemic VTI:  0.18 m                             Systemic Diam: 2.00 cm Carlyle Dolly MD Electronically signed by Carlyle Dolly MD Signature Date/Time: 11/06/2019/11:30:42 AM    Final      Cardiac Studies   2D echo 11/06/2019 IMPRESSIONS    1. Left ventricular ejection fraction, by estimation, is 60 to 65%. The  left ventricle has normal function. Left ventricular endocardial border  not optimally defined to evaluate regional wall motion. There is mild left  ventricular hypertrophy. Left  ventricular diastolic parameters are indeterminate.  2. Right ventricular systolic function is normal. The right ventricular  size is normal.  3. Left atrial size was mildly dilated.  4. The pericardial effusion is circumferential.  5.  The mitral valve is normal in structure. Trivial mitral valve  regurgitation. No evidence of mitral stenosis.  6. The aortic valve was not well visualized. Aortic valve regurgitation  is not visualized. No aortic stenosis is present.   Patient Profile     86 y.o. 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  Assessment & Plan    1.  Atrial fibrillation with RVR  -likely triggered by fever and sepsis -unknown duration -HR elevated this am despite being on Cardizem gtt at 15mg /hr -BP stable so will add Lopressor 25mg  BID to try to get better HR control -HR likely being driven by anemia and sepsis -CHA2DS2-VASc = 4 (age x 2, PAD, DM) but not started on oral anticoagulation due to anemia -IV Heparin gtt off until anemia workup completed (Hbg 7 this am) after transfusion  2.  Pericardial effusion - small effusion by CT scan with report indicating possible complex/exudative effusion. Also with small bilateral pleural effusions not impressive at all by echo, though limited visualiation in the subcostal windows.  - at this time no further workup recommended for trivial/circumferential effusion.  - dont need to hold plavix for effusion,  defer to primary team if want to hold for anemia. He is on it for PAD history, no cardiac indication.   3.  Anemia -Hbg low as 6.9>7 after transfusion -felt partly to be dilutional after getting 3L fluid for sepsis  4.  Sepsis -per Primary team     I have spent a total of 35 minutes with patient reviewing consult note, 2D echo , telemetry, EKGs, labs and examining patient as well as establishing an assessment and plan that was discussed with the patient.  > 50% of time was spent in direct patient care.    For questions or updates, please contact [facility] Please consult [email or web address] for contact info under Cardiology/STEMI.      Signed, Fransico Him, MD  11/07/2019, 9:46 AM

## 2019-11-08 ENCOUNTER — Inpatient Hospital Stay (HOSPITAL_COMMUNITY): Payer: Medicare Other

## 2019-11-08 DIAGNOSIS — K5904 Chronic idiopathic constipation: Secondary | ICD-10-CM

## 2019-11-08 DIAGNOSIS — R140 Abdominal distension (gaseous): Secondary | ICD-10-CM

## 2019-11-08 LAB — COMPREHENSIVE METABOLIC PANEL
ALT: 17 U/L (ref 0–44)
AST: 18 U/L (ref 15–41)
Albumin: 1.9 g/dL — ABNORMAL LOW (ref 3.5–5.0)
Alkaline Phosphatase: 124 U/L (ref 38–126)
Anion gap: 9 (ref 5–15)
BUN: 11 mg/dL (ref 8–23)
CO2: 17 mmol/L — ABNORMAL LOW (ref 22–32)
Calcium: 7.9 mg/dL — ABNORMAL LOW (ref 8.9–10.3)
Chloride: 107 mmol/L (ref 98–111)
Creatinine, Ser: 0.97 mg/dL (ref 0.61–1.24)
GFR calc Af Amer: >60 mL/min (ref >60)
GFR calc non Af Amer: >60 mL/min (ref >60)
Glucose, Bld: 125 mg/dL — ABNORMAL HIGH (ref 70–99)
Potassium: 3.4 mmol/L — ABNORMAL LOW (ref 3.5–5.1)
Sodium: 133 mmol/L — ABNORMAL LOW (ref 135–145)
Total Bilirubin: 0.5 mg/dL (ref 0.3–1.2)
Total Protein: 6 g/dL — ABNORMAL LOW (ref 6.5–8.1)

## 2019-11-08 LAB — CBC WITH DIFFERENTIAL/PLATELET
Abs Immature Granulocytes: 0.08 10*3/uL — ABNORMAL HIGH (ref 0.00–0.07)
Basophils Absolute: 0.1 10*3/uL (ref 0.0–0.1)
Basophils Relative: 1 %
Eosinophils Absolute: 0.7 10*3/uL — ABNORMAL HIGH (ref 0.0–0.5)
Eosinophils Relative: 7 %
HCT: 27.8 % — ABNORMAL LOW (ref 39.0–52.0)
Hemoglobin: 8.5 g/dL — ABNORMAL LOW (ref 13.0–17.0)
Immature Granulocytes: 1 %
Lymphocytes Relative: 19 %
Lymphs Abs: 2 10*3/uL (ref 0.7–4.0)
MCH: 25.4 pg — ABNORMAL LOW (ref 26.0–34.0)
MCHC: 30.6 g/dL (ref 30.0–36.0)
MCV: 83.2 fL (ref 80.0–100.0)
Monocytes Absolute: 0.9 10*3/uL (ref 0.1–1.0)
Monocytes Relative: 8 %
Neutro Abs: 6.6 10*3/uL (ref 1.7–7.7)
Neutrophils Relative %: 64 %
Platelets: 675 10*3/uL — ABNORMAL HIGH (ref 150–400)
RBC: 3.34 MIL/uL — ABNORMAL LOW (ref 4.22–5.81)
RDW: 15.3 % (ref 11.5–15.5)
WBC: 10.4 10*3/uL (ref 4.0–10.5)
nRBC: 0 % (ref 0.0–0.2)

## 2019-11-08 LAB — TYPE AND SCREEN
ABO/RH(D): O POS
Antibody Screen: NEGATIVE
Unit division: 0

## 2019-11-08 LAB — BPAM RBC
Blood Product Expiration Date: 202110162359
ISSUE DATE / TIME: 202109180609
Unit Type and Rh: 5100

## 2019-11-08 LAB — GLUCOSE, CAPILLARY
Glucose-Capillary: 172 mg/dL — ABNORMAL HIGH (ref 70–99)
Glucose-Capillary: 116 mg/dL — ABNORMAL HIGH (ref 70–99)
Glucose-Capillary: 148 mg/dL — ABNORMAL HIGH (ref 70–99)
Glucose-Capillary: 236 mg/dL — ABNORMAL HIGH (ref 70–99)
Glucose-Capillary: 270 mg/dL — ABNORMAL HIGH (ref 70–99)

## 2019-11-08 LAB — MAGNESIUM: Magnesium: 1.6 mg/dL — ABNORMAL LOW (ref 1.7–2.4)

## 2019-11-08 MED ORDER — ZINC OXIDE 40 % EX OINT
TOPICAL_OINTMENT | CUTANEOUS | Status: DC | PRN
  Filled 2019-11-08 (×4): qty 57

## 2019-11-08 MED ORDER — ALPRAZOLAM 0.5 MG PO TABS
0.5000 mg | ORAL_TABLET | Freq: Two times a day (BID) | ORAL | Status: DC | PRN
  Administered 2019-11-21: 0.5 mg via ORAL
  Filled 2019-11-08: qty 1

## 2019-11-08 MED ORDER — DILTIAZEM HCL 60 MG PO TABS
60.0000 mg | ORAL_TABLET | Freq: Four times a day (QID) | ORAL | Status: DC
  Administered 2019-11-08 – 2019-11-12 (×15): 60 mg via ORAL
  Filled 2019-11-08 (×15): qty 1

## 2019-11-08 MED ORDER — MAGNESIUM SULFATE 2 GM/50ML IV SOLN
2.0000 g | Freq: Once | INTRAVENOUS | Status: AC
  Administered 2019-11-08: 2 g via INTRAVENOUS
  Filled 2019-11-08: qty 50

## 2019-11-08 MED ORDER — POLYETHYLENE GLYCOL 3350 17 G PO PACK
17.0000 g | PACK | Freq: Every day | ORAL | Status: DC
  Administered 2019-11-08 – 2019-11-26 (×18): 17 g via ORAL
  Filled 2019-11-08 (×20): qty 1

## 2019-11-08 MED ORDER — DILTIAZEM HCL 60 MG PO TABS
90.0000 mg | ORAL_TABLET | Freq: Four times a day (QID) | ORAL | Status: DC
  Administered 2019-11-08: 90 mg via ORAL
  Filled 2019-11-08: qty 2

## 2019-11-08 MED ORDER — ZINC OXIDE 20 % EX OINT: TOPICAL_OINTMENT | CUTANEOUS | Status: DC | PRN

## 2019-11-08 MED ORDER — PANTOPRAZOLE SODIUM 40 MG IV SOLR
40.0000 mg | Freq: Every day | INTRAVENOUS | Status: DC
  Administered 2019-11-08 – 2019-11-15 (×8): 40 mg via INTRAVENOUS
  Filled 2019-11-08 (×8): qty 40

## 2019-11-08 MED ORDER — GADOBUTROL 1 MMOL/ML IV SOLN
8.0000 mL | Freq: Once | INTRAVENOUS | Status: AC | PRN
  Administered 2019-11-08: 8 mL via INTRAVENOUS

## 2019-11-08 MED ORDER — POTASSIUM CHLORIDE CRYS ER 20 MEQ PO TBCR
40.0000 meq | EXTENDED_RELEASE_TABLET | Freq: Four times a day (QID) | ORAL | Status: AC
  Administered 2019-11-08 (×2): 40 meq via ORAL
  Filled 2019-11-08 (×2): qty 2

## 2019-11-08 NOTE — Progress Notes (Addendum)
PROGRESS NOTE    Anthony Dunlap  BZJ:[id number]  DOB: June 08, 1933  DOA: 11/05/2019 PCP: [name], MD Outpatient Specialists:   Hospital course:  86 year old with DM2, prostate cancer, severe PVD followed by [name] status post right BKA and history of hip osteomyelitis on chronic suppressive antibiotic therapy was admitted yesterday for fever and new onset A. fib with RVR.  Subjective:  Patient without any new complaints.  Spoke with patient's daughter who is his caregiver. She notes that he has had black stools for the past 1 to 2 weeks.  She had read that this could be blood so she wanted to know if it was.  She is also worried about the osteomyelitis and is concerned they might have to do another amputation.  She understands that vascular surgery will need to evaluate blood supply to his leg before a decision can be made.  Objective: Vitals:   11/08/19 0003 11/08/19 0349 11/08/19 0746 11/08/19 1135  BP: 115/78 120/76 109/72 104/69  Pulse: 93 (!) 109 98 83  Resp: (!) 22 20 20 18   Temp: 98.7 F (37.1 C) 98 F (36.7 C) 98.2 F (36.8 C) 98.8 F (37.1 C)  TempSrc: Oral Oral Oral Oral  SpO2:  95% 98% 98%  Weight:      Height:        Intake/Output Summary (Last 24 hours) at 11/08/2019 1316 Last data filed at 11/08/2019 0351 Gross per 24 hour  Intake 2219.99 ml  Output 970 ml  Net 1249.99 ml   Filed Weights   11/05/19 1126 11/07/19 0616  Weight: 77.1 kg 79.7 kg     Exam:  General: Chronically ill-appearing man lying in bed in no acute distress.  Eyes: sclera anicteric, conjuctiva mild injection bilaterally CVS: S1-S2, regular  Respiratory:  decreased air entry bilaterally secondary to decreased inspiratory effort, rales at bases  GI: NABS, soft, NT  LE: He has an ulcer on left heel with fibrinous exudate that does not look superficially infected.  BKA on right. Neuro: grossly nonfocal.    Assessment & Plan:   Sepsis likely secondary to  osteomyelitis of left foot MRI done today confirms osteomyelitis of posterior lateral calcaneus and surrounding cellulitis Vascular surgery consult placed  Continue daptomycin and Unasyn per [name] who is following  Atrial fibrillation with RVR Appreciate cardiology input Heart rate under better control with addition of Lopressor 25 twice daily Chads vasc2 is 4, hold anticoagulation until GI seen patient given reported history of melena last week  Anemia with reported melena Patient was transfused 1 unit PRBC on admission H&H have increased appropriately and are stable. Will ask GI to see patient for likely endoscopy Patient is on Protonix 40 mg daily Guaiac stools x3  Hypokalemia We will replete and recheck  Hypomagnesemia We will replete and recheck  Anxiety and Depression Restart Xanax 0.5 p.o. twice daily as needed per daughter's request   DM2 Blood sugar is coming down with improved control of infection  Continue present inpatient management  Pericardial effusion Does not appear exudative per 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   DVT prophylaxis: Subcu heparin Code Status: Full Family Communication: None present Disposition Plan:   Patient is from: Home  Anticipated Discharge Location: SNF  Barriers to Discharge: Sepsis  Is patient medically stable for Discharge: No   Consultants:  Cardiology  ID  Vascular surgery to be consulted in the morning  Procedures:  None  Antimicrobials:  Daptomycin  Unasyn   Data Reviewed:  Basic Metabolic Panel: Recent Labs  Lab 11/05/19 1203 11/06/19 0303 11/07/19 1001 11/08/19 0802  NA 127* 132* 133* 133*  K 3.2* 4.2 3.5 3.4*  CL 95* 105 106 107  CO2 19* 21* 17* 17*  GLUCOSE 139* 152* 207* 125*  BUN 23 18 15 11   CREATININE 1.04 0.87 0.98 0.97   CALCIUM 8.2* 7.6* 7.9* 7.9*  MG  --  1.7  --  1.6*  PHOS  --  2.6  --   --    Liver Function Tests: Recent Labs  Lab 11/05/19 1203 11/06/19 0303 11/08/19 0802  AST 43* 28 18  ALT 33 29 17  ALKPHOS 150* 131* 124  BILITOT 0.7 0.6 0.5  PROT 7.0 6.3* 6.0*  ALBUMIN 2.6* 2.2* 1.9*   No results for input(s): LIPASE, AMYLASE in the last 168 hours. No results for input(s): AMMONIA in the last 168 hours. CBC: Recent Labs  Lab 11/05/19 1203 11/06/19 0303 11/06/19 2054 11/07/19 1001 11/08/19 0802  WBC 11.0* 10.5  --  10.3 10.4  NEUTROABS 8.6*  --   --  7.9* 6.6  HGB 7.4* 6.9* 7.0* 8.3* 8.5*  HCT 24.4* 22.8* 23.5* 27.0* 27.8*  MCV 82.2 83.2  --  83.9 83.2  PLT 622* 606*  --  681* 675*   Cardiac Enzymes: Recent Labs  Lab 11/07/19 1001  CKTOTAL 68   BNP (last 3 results) No results for input(s): PROBNP in the last 8760 hours. CBG: Recent Labs  Lab 11/07/19 1633 11/07/19 2151 11/08/19 0358 11/08/19 0746 11/08/19 1135  GLUCAP 185* 270* 172* 116* 148*    Recent Results (from the past 240 hour(s))  Urine culture     Status: Abnormal   Collection Time: 11/05/19 11:55 AM   Specimen: In/Out Cath Urine  Result Value Ref Range Status   Specimen Description   Final    IN/OUT CATH URINE Performed at [facility], [address], [place], [place] [address]    Special Requests   Final    NONE Performed at [facility], [address] [place], [place] [address]    Culture MULTIPLE SPECIES PRESENT, SUGGEST RECOLLECTION (A)  Final   Report Status 11/06/2019 FINAL  Final  Blood Culture (routine x 2)     Status: None (Preliminary result)   Collection Time: 11/05/19 12:03 PM   Specimen: BLOOD  Result Value Ref Range Status   Specimen Description BLOOD  Final   Special Requests NONE  Final   Culture   Final    NO GROWTH 3 DAYS Performed at [facility], [address] [place], [place] [address]    Report Status PENDING  Incomplete  Blood Culture (routine x 2)     Status:  None (Preliminary result)   Collection Time: 11/05/19 12:12 PM   Specimen: BLOOD  Result Value Ref Range Status   Specimen Description BLOOD  Final   Special Requests NONE  Final   Culture   Final    NO GROWTH 3 DAYS Performed at [facility], [address] [place],  [address]    Report Status PENDING  Incomplete  SARS Coronavirus 2 by RT PCR (hospital order, performed in [place] hospital lab) Nasopharyngeal Nasopharyngeal Swab     Status: None   Collection Time: 11/05/19 12:17 PM   Specimen: Nasopharyngeal Swab  Result Value Ref Range Status  SARS Coronavirus 2 NEGATIVE NEGATIVE Final    Comment: (NOTE) SARS-CoV-2 target nucleic acids are NOT DETECTED.  The SARS-CoV-2 RNA is generally detectable in upper and lower respiratory specimens during the acute phase of infection. The lowest concentration of SARS-CoV-2 viral copies this assay can detect is 250 copies / mL. A negative result does not preclude SARS-CoV-2 infection and should not be used as the sole basis for treatment or other patient management decisions.  A negative result may occur with improper specimen collection / handling, submission of specimen other than nasopharyngeal swab, presence of viral mutation(s) within the areas targeted by this assay, and inadequate number of viral copies (<250 copies / mL). A negative result must be combined with clinical observations, patient history, and epidemiological information.  Fact Sheet for Patients:   StrictlyIdeas.no  Fact Sheet for Healthcare Providers: BankingDealers.co.za  This test is not yet approved or  cleared by the [place] FDA and has been authorized for detection and/or diagnosis of SARS-CoV-2 by FDA under an Emergency Use Authorization (EUA).  This EUA will remain in effect (meaning this test can be used) for the duration of the COVID-19 declaration under Section 564(b)(1) of the Act, 21  U.S.C. section 360bbb-3(b)(1), unless the authorization is terminated or revoked sooner.  Performed at [facility], [address] [place], [place] [address]       Studies: MR FOOT LEFT W WO CONTRAST  Result Date: 11/08/2019 CLINICAL DATA:  Osteomyelitis of the foot. Plain foot soft tissue wound EXAM: MRI OF THE LEFT FOREFOOT WITHOUT AND WITH CONTRAST TECHNIQUE: Multiplanar, multisequence MR imaging of the left foot was performed both before and after administration of intravenous contrast. CONTRAST:  88mL GADAVIST GADOBUTROL 1 MMOL/ML IV SOLN COMPARISON:  None. FINDINGS: TENDONS Peroneal: Peroneal longus tendon intact. Peroneal brevis intact. Posteromedial: Posterior tibial tendon intact. Flexor hallucis longus tendon intact. Flexor digitorum longus tendon intact. Anterior: Tibialis anterior tendon intact. Extensor hallucis longus tendon intact Extensor digitorum longus tendon intact. Achilles:  Mild tendinosis of the distal Achilles tendon. Plantar Fascia: Thickening of the medial band of the plantar fascia at the calcaneal insertion as can be seen with mild plantar fasciitis. LIGAMENTS Lateral: Anterior talofibular ligament intact. Calcaneofibular ligament intact. Posterior talofibular ligament intact. Anterior and posterior tibiofibular ligaments intact. Medial: Deltoid ligament intact. Spring ligament intact. CARTILAGE Ankle Joint: No joint effusion. Normal ankle mortise. No chondral defect. Subtalar Joints/Sinus Tarsi: Normal subtalar joints. No subtalar joint effusion. Normal sinus tarsi. Bones/Soft Tissue: Soft tissue wound along the posterolateral aspect of the hindfoot overlying the posterior calcaneus. Underlying cortical destruction of the posterolateral calcaneus just inferior to the Achilles insertion with severe surrounding marrow edema most consistent with osteomyelitis. No drainable fluid collection. Soft tissue edema throughout the hindfoot extending into the ankle concerning for  cellulitis. T2 hyperintense signal throughout the plantar musculature likely neurogenic. IMPRESSION: 1. Soft tissue wound along the posterolateral aspect of the hindfoot overlying the posterior calcaneus. Underlying cortical destruction of the posterolateral calcaneus just inferior to the Achilles insertion with severe surrounding marrow edema most consistent with osteomyelitis with surrounding cellulitis. No drainable fluid collection. 2. Mild tendinosis of the distal Achilles tendon. 3. Thickening of the medial band of the plantar fascia at the calcaneal insertion as can be seen with mild plantar fasciitis. Electronically Signed   By: Kathreen Devoid   On: 11/08/2019 11:46     Scheduled Meds: . sodium chloride   Intravenous Once  . sodium chloride   Intravenous Once  . amitriptyline  20 mg Oral QHS  . aspirin EC  81 mg Oral Q breakfast  . Chlorhexidine Gluconate Cloth  6 each Topical Daily  . clopidogrel  75 mg Oral Daily  . collagenase   Topical Daily  . diltiazem  60 mg Oral Q6H  . ferrous sulfate  325 mg Oral Q breakfast  . heparin  5,000 Units Subcutaneous Q8H  . insulin aspart  0-15 Units Subcutaneous TID WC  . insulin aspart  0-5 Units Subcutaneous QHS  . insulin glargine  20 Units Subcutaneous QHS  . levothyroxine  100 mcg Oral Q breakfast  . metoprolol tartrate  25 mg Oral BID  . multivitamin with minerals  1 tablet Oral Daily  . pantoprazole  40 mg Oral Daily  . pravastatin  10 mg Oral Q supper  . primidone  150 mg Oral QHS  . tobramycin  2 drop Both Eyes Q4H while awake   Continuous Infusions: . sodium chloride 50 mL/hr at 11/08/19 0519  . ampicillin-sulbactam (UNASYN) IV 3 g (11/08/19 1259)  . DAPTOmycin (CUBICIN)  IV 228 mL/hr at 11/07/19 1500    Active Problems:   HLD (hyperlipidemia)   Chronic anemia   Hypothyroidism   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 ([place])   Sepsis ([place])     Chayanne Speir Derek Jack, [facility]  If 7PM-7AM,  please contact night-coverage [email or web address] Password [email or web address] 11/08/2019, 1:16 PM    LOS: 3 days

## 2019-11-08 NOTE — Progress Notes (Signed)
Pt's daughter requested Xanax prn and asked to speak to MD. Messages relayed to Jamse Arn, MD

## 2019-11-08 NOTE — H&P (View-Only) (Signed)
° °Referring Provider:  [facility]         °Primary Care Physician:  Fusco, Lawrence, MD °Primary Gastroenterologist: ? Dr. Rehman ( remotely)            °We were asked to see this patient for:   Anemia and dark stools           ° ° Attending physician's note  ° °I have taken a history, examined the patient and reviewed the chart. I agree with the Advanced Practitioner's note, impression and recommendations. ° °86-year-old man with multiple comorbidities including peripheral vascular disease, osteomyelitis s/p BKA on dual antiplatelet therapy with aspirin and Plavix with chronic anemia requiring intermittent blood transfusions ° °New onset A. fib with RVR, anticoagulation currently on hold ° °Will plan to proceed with EGD to exclude any high risk lesions.  Last dose of Plavix and aspirin this morning.  We will hold Plavix dose tomorrow.  Not adequate time for Plavix washout but diagnostic EGD likely will be low risk for bleeding ° °He is ordered for fecal Hemoccult cards, will follow up results though is less likely to change the management at this point ° °Abdominal distention with increased stool burden °Replete potassium >4 and magnesium >2 °Turn in bed every 4 hours and out of bed to chair as tolerated °Bowel regimen with daily MiraLAX with goal 1-2 soft bowel movements daily ° ° The patient was provided an opportunity to ask questions and all were answered. The patient agreed with the plan and demonstrated an understanding of the instructions. ° °K. Veena Nandigam , MD °[phone]   ° °ASSESSMENT / PLAN:  ° ° °# Chronic Empire anemia / slight drop in hgb since August. Dark stools (on iron). Appears to be on plavix and asa.  °--Plavix and asa on home med list. Patient nor daughter could confirm that he takes them.  °--Baseline hgb had to discern with hospitalizations / surgeries. It was 7.4 on 09/30/19. In admission is was also 7.4. However after IV fluids hgb declined to 6.9, improved to 8.5 post one unit  of blood.  °--Hemoccult cards are pending.  °--Will proceed with EGD to rule out any high risk lesions. He is on plavix but will at least hold tomorrow's dose. I spoke with daughter Anthony Dunlap regarding the risks / benefit of the procedure and she agrees to proceed.  ° °# Abdominal distention °--CT scan abd / pelvis w/ contrast 3 days ago negative for obstruction, markedly redundant sigmoid colon, moderate stool within ascending and transverse colon.  °--Patient does complain of lower abdominal discomfort but description is vague. He is a limited historian.  °--Follow up KUB today. °--Start daily miralax.  ° °# New Afib with RVR °--rate controlled. Cardiology holding off on starting anticoagulant due to anemia and sepsis.  ° °# Sepsis, like secondary to left heel osteomyelitis  ° °# Small pericardial effusion, felt exudative ° °# Severe PVD, ? Reason he is on plavix ° °# Chronic pelvic / hip osteomyelitis. On chronic antibiotics with Infectious Disease.  ° ° °HPI:  °                                                                                                                        °  Chief Complaint: abdominal discomfort  ° °History mainly comes from chart and daughter Anthony Dunlap. Patient has some confusion. Says he lives with his wife ( she passed away a couple of weeks ago). He is struggling to provide a history.  ° °Anthony Dunlap is a 86 y.o. male with a pmh significant for, not necessarily limited to: hypertension, hyperlipidemia, PVD s/p recent BKA, thyroid disease, prostate cancer, hx of drug induced liver disease ( fluconazole), diabetes, chronic pelvic / hip osteomyelitis on antibiotics through ID, chronic anemia.  ° °Patient brought to ED 9/16 after home health nurse found him febrile. He met sepsis criteria on admission. Also found to have new Afib with RVR and a pericardial effusion. Cardiology evaluated. Rate controlled now. Anticoagulation indicated but not yet started due to anemia and sepsis.    ° °Patient  endorses lower abdominal discomfort but can be descriptive other than to say it is a "drawing" pain. Spoke with daughter Anthony Dunlap. Last week she noticed patient's stool being watery and black. Patient says he usually has to take something to have a bm ( senna on home med list) but lately his stools have been watery. Daughter doesn't know when / if patient has ever had a colonoscopy.  ° °PREVIOUS ENDOSCOPIC EVALUATIONS / PERTINENT STUDIES  ° °Unknown. None found in Epic ° °Past Medical History:  °Diagnosis Date  °• Chronic heel ulcer, left, limited to breakdown of skin ([name]) 10/28/2019  °• 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 ([name]) 04/21/2018  °• Diabetes ([name])   °• Drug-induced hepatitis 06/05/2018  °• Inguinal hernia   ° 06/09/2019: per patient " a long time ago on the right side"  °• Left eye pain   °• Localized osteoarthrosis of right hip 01/05/2019  °• Prostate cancer ([name])   ° ° °Past Surgical History:  °Procedure Laterality Date  °• ABDOMINAL AORTOGRAM W/LOWER EXTREMITY Bilateral 08/12/2019  ° Procedure: ABDOMINAL AORTOGRAM W/LOWER EXTREMITY;  Surgeon: Clark, Christopher J, MD;  Location: [facility];  Service: Cardiovascular;  Laterality: Bilateral;  °• ABDOMINAL AORTOGRAM W/LOWER EXTREMITY Left 08/19/2019  ° Procedure: ABDOMINAL AORTOGRAM W/LOWER EXTREMITY;  Surgeon: Clark, Christopher J, MD;  Location: [facility];  Service: Cardiovascular;  Laterality: Left;  °• AMPUTATION Right 09/28/2019  ° Procedure: AMPUTATION BELOW KNEE;  Surgeon: Early, Todd F, MD;  Location: [facility];  Service: Vascular;  Laterality: Right;  °• HERNIA REPAIR    °• IR RADIOLOGIST EVAL & MGMT  03/06/2018  °• PERIPHERAL VASCULAR BALLOON ANGIOPLASTY Left 08/19/2019  ° Procedure: PERIPHERAL VASCULAR BALLOON ANGIOPLASTY;  Surgeon: Clark, Christopher J, MD;  Location: [facility];  Service: Cardiovascular;  Laterality: Left;  °• PROSTATE SURGERY    °• TOTAL HIP ARTHROPLASTY Right 06/12/2019  ° Procedure: RIGHT TOTAL  HIP ARTHROPLASTY DIRECT ANTERIOR;  Surgeon: Yates, Mark C, MD;  Location: [facility];  Service: Orthopedics;  Laterality: Right;  ° ° °Prior to Admission medications   °Medication Sig Start Date End Date Taking? Authorizing Provider  °acetaminophen (TYLENOL) 500 MG tablet Take 1,000 mg by mouth every 6 (six) hours as needed for mild pain or moderate pain.    [provider]  °ALPRAZolam (XANAX) 0.5 MG tablet Take 1 tablet (0.5 mg total) by mouth 2 (two) times daily as needed for anxiety. 09/30/19   Vann, Jessica U, DO  °amitriptyline (ELAVIL) 10 MG tablet Take 20 mg by mouth at bedtime.  11/28/18   [provider]  °amoxicillin-clavulanate (AUGMENTIN) 875-125 MG tablet TAKE 1   TABLET BY MOUTH TWICE DAILY °Patient taking differently: Take 1 tablet by mouth 2 (two) times daily.  06/12/19   Van Dam, Cornelius N, MD  °aspirin EC 81 MG tablet Take 81 mg by mouth daily with breakfast. Swallow whole.     [provider]  °ciprofloxacin (CIPRO) 500 MG tablet Take 500 mg by mouth 2 (two) times daily. 11/02/19   [provider]  °clopidogrel (PLAVIX) 75 MG tablet Take 1 tablet (75 mg total) by mouth daily. °Patient not taking: Reported on 10/28/2019 08/19/19 08/18/20  Clark, Christopher J, MD  °colchicine 0.6 MG tablet Take 0.6 mg by mouth daily as needed (gout attacks).  07/27/19   [provider]  °ferrous sulfate 325 (65 FE) MG tablet Take 1 tablet (325 mg total) by mouth 2 (two) times daily with a meal. 09/30/19   Vann, Jessica U, DO  °furosemide (LASIX) 20 MG tablet Take 1 tablet (20 mg total) by mouth daily with breakfast. °Patient taking differently: Take 20 mg by mouth daily as needed for fluid.  10/02/19   Vann, Jessica U, DO  °glimepiride (AMARYL) 4 MG tablet Take 4 mg by mouth daily with breakfast.  04/18/18   [provider]  °insulin aspart (NOVOLOG) 100 UNIT/ML injection Inject 0-15 Units into the skin 3 (three) times daily with meals. Sliding scale insulin °Less than 70  initiate hypoglycemia protocol °70-120  0 units °120-150 2 unit °151-200 3 units °201-250 3 units °251-300 5 units °301-350 8 units °351-400 11 units  °Greater than 400 15 units , call MD °Patient taking differently: Inject 0-16 Units into the skin See admin instructions. Inject 0-16 units subcutaneously three times daily with meals per sliding scale: CBG 70-120 0 units 120-150 4 units, 151-200 5 units, 201-250 6 units, 251-300 7 units, 301-350 10 units, 351-400 13 units, >400 16 units and call MD (Less than 70 initiate hypoglycemia protocol) 02/18/18   Lama, Gagan S, MD  °Insulin Glargine-Lixisenatide (SOLIQUA) 100-33 UNT-MCG/ML SOPN Inject 40 Units into the skin daily after breakfast.    [provider]  °levothyroxine (SYNTHROID, LEVOTHROID) 100 MCG tablet Take 100 mcg by mouth daily with breakfast.  04/18/18   [provider]  °meloxicam (MOBIC) 7.5 MG tablet Take 7.5 mg by mouth 2 (two) times daily. 11/01/19   [provider]  °Multiple Vitamin (MULTIVITAMIN WITH MINERALS) TABS tablet Take 1 tablet by mouth daily. 09/30/19   Vann, Jessica U, DO  °nutrition supplement, JUVEN, (JUVEN) PACK Take 1 packet by mouth 2 (two) times daily between meals. 09/30/19   Vann, Jessica U, DO  °Nutritional Supplements (,FEEDING SUPPLEMENT, PROSOURCE PLUS) liquid Take 30 mLs by mouth 2 (two) times daily with a meal. 09/30/19   Vann, Jessica U, DO  °nystatin (MYCOSTATIN/NYSTOP) powder Apply topically 4 (four) times daily. °Patient taking differently: Apply 1 application topically daily as needed (yeast).  03/03/19   Van Dam, Cornelius N, MD  °oxyCODONE (OXY IR/ROXICODONE) 5 MG immediate release tablet Take 1 tablet (5 mg total) by mouth every 4 (four) hours as needed for moderate pain. 09/30/19   Vann, Jessica U, DO  °pantoprazole (PROTONIX) 40 MG tablet Take 1 tablet (40 mg total) by mouth daily. 09/30/19   Vann, Jessica U, DO  °pravastatin (PRAVACHOL) 10 MG tablet Take 10 mg by mouth daily with supper.      [provider]  °primidone (MYSOLINE) 50 MG tablet Take 150 mg by mouth at bedtime.  02/26/19   [provider]  °senna-docusate (  SENOKOT-S) 8.6-50 MG tablet Take 1 tablet by mouth 2 (two) times daily. 09/30/19   Vann, Jessica U, DO  °silver sulfADIAZINE (SILVADENE) 1 % cream Apply topically 2 (two) times daily. 09/30/19   Vann, Jessica U, DO  °sulfamethoxazole-trimethoprim (BACTRIM DS) 800-160 MG tablet Take 1 tablet by mouth 2 (two) times daily. 11/04/19   [provider]  ° ° °Current Facility-Administered Medications  °Medication Dose Route Frequency Provider Last Rate Last Admin  °• 0.9 %  sodium chloride infusion (Manually program via Guardrails IV Fluids)   Intravenous Once Zierle-Ghosh, Asia B, DO      °• 0.9 %  sodium chloride infusion (Manually program via Guardrails IV Fluids)   Intravenous Once Rathore, Vasundhra, MD      °• 0.9 %  sodium chloride infusion   Intravenous Continuous Elgergawy, Dawood S, MD 50 mL/hr at 11/08/19 0519 New Bag at 11/08/19 0519  °• acetaminophen (TYLENOL) tablet 650 mg  650 mg Oral Q6H PRN Elgergawy, Dawood S, MD   650 mg at 11/06/19 0050  ° Or  °• acetaminophen (TYLENOL) suppository 650 mg  650 mg Rectal Q6H PRN Elgergawy, Dawood S, MD      °• ALPRAZolam (XANAX) tablet 0.5 mg  0.5 mg Oral BID PRN Chatterjee, Srobona Tublu, MD      °• amitriptyline (ELAVIL) tablet 20 mg  20 mg Oral QHS Elgergawy, Dawood S, MD   20 mg at 11/07/19 2251  °• Ampicillin-Sulbactam (UNASYN) 3 g in sodium chloride 0.9 % 100 mL IVPB  3 g Intravenous Q6H Van Dam, Cornelius N, MD 200 mL/hr at 11/08/19 1259 3 g at 11/08/19 1259  °• aspirin EC tablet 81 mg  81 mg Oral Q breakfast Elgergawy, Dawood S, MD   81 mg at 11/08/19 0853  °• Chlorhexidine Gluconate Cloth 2 % PADS 6 each  6 each Topical Daily Johnson, Clanford L, MD   6 each at 11/08/19 0910  °• clopidogrel (PLAVIX) tablet 75 mg  75 mg Oral Daily Johnson, Clanford L, MD   75 mg at 11/08/19 0853  °• colchicine tablet 0.6 mg  0.6  mg Oral Daily PRN Johnson, Clanford L, MD      °• collagenase (SANTYL) ointment   Topical Daily Branch, Jonathan F, MD   Given at 11/08/19 0907  °• DAPTOmycin (CUBICIN) 700 mg in sodium chloride 0.9 % IVPB  700 mg Intravenous Q2000 Van Dam, Cornelius N, MD 228 mL/hr at 11/07/19 1500 Rate Verify at 11/07/19 1500  °• diltiazem (CARDIZEM) tablet 60 mg  60 mg Oral Q6H Turner, Traci R, MD      °• ferrous sulfate tablet 325 mg  325 mg Oral Q breakfast Johnson, Clanford L, MD   325 mg at 11/08/19 0853  °• heparin injection 5,000 Units  5,000 Units Subcutaneous Q8H Elgergawy, Dawood S, MD   5,000 Units at 11/08/19 1253  °• HYDROcodone-acetaminophen (NORCO/VICODIN) 5-325 MG per tablet 1-2 tablet  1-2 tablet Oral Q4H PRN Elgergawy, Dawood S, MD      °• insulin aspart (novoLOG) injection 0-15 Units  0-15 Units Subcutaneous TID WC Elgergawy, Dawood S, MD   2 Units at 11/08/19 1253  °• insulin aspart (novoLOG) injection 0-5 Units  0-5 Units Subcutaneous QHS Elgergawy, Dawood S, MD   3 Units at 11/07/19 2252  °• insulin glargine (LANTUS) injection 20 Units  20 Units Subcutaneous QHS Elgergawy, Dawood S, MD   20 Units at 11/07/19 2252  °• levothyroxine (SYNTHROID) tablet 100 mcg  100 mcg Oral   Q breakfast Elgergawy, Dawood S, MD   100 mcg at 11/08/19 0853  °• magnesium sulfate IVPB 2 g 50 mL  2 g Intravenous Once Chatterjee, Srobona Tublu, MD      °• metoprolol tartrate (LOPRESSOR) tablet 25 mg  25 mg Oral BID Turner, Traci R, MD   25 mg at 11/08/19 0853  °• multivitamin with minerals tablet 1 tablet  1 tablet Oral Daily Elgergawy, Dawood S, MD   1 tablet at 11/08/19 0853  °• pantoprazole (PROTONIX) EC tablet 40 mg  40 mg Oral Daily Van Dam, Cornelius N, MD   40 mg at 11/08/19 0853  °• potassium chloride SA (KLOR-CON) CR tablet 40 mEq  40 mEq Oral Q6H Chatterjee, Srobona Tublu, MD      °• pravastatin (PRAVACHOL) tablet 10 mg  10 mg Oral Q supper Elgergawy, Dawood S, MD   10 mg at 11/07/19 1638  °• primidone (MYSOLINE) tablet 150 mg   150 mg Oral QHS Elgergawy, Dawood S, MD   150 mg at 11/07/19 2251  °• tobramycin (TOBREX) 0.3 % ophthalmic solution 2 drop  2 drop Both Eyes Q4H while awake Elgergawy, Dawood S, MD   2 drop at 11/08/19 1254  ° ° °Allergies as of 11/05/2019 - Review Complete 11/05/2019  °Allergen Reaction Noted  °• Fluconazole Other (See Comments) 06/05/2018  ° ° °Family History  °Problem Relation Age of Onset  °• Pneumonia Father   °• Cancer Sister   ° ° °Social History  ° °Socioeconomic History  °• Marital status: Widowed  °  Spouse name: Shirley  °• Number of children: 3  °• Years of education: 9th  °• Highest education level: Not on file  °Occupational History  °  Employer: RETIRED  °  Comment: Retired  °Tobacco Use  °• Smoking status: Never Smoker  °• Smokeless tobacco: Never Used  °Vaping Use  °• Vaping Use: Never used  °Substance and Sexual Activity  °• Alcohol use: No  °• Drug use: No  °• Sexual activity: Not on file  °Other Topics Concern  °• Not on file  °Social History Narrative  ° Patient lives at home with his wife. ( Shirley) . Patient is retired.  ° Education 9th grade.  ° Right handed.  ° Caffeine None  ° °Social Determinants of Health  ° °Financial Resource Strain:   °• Difficulty of Paying Living Expenses: Not on file  °Food Insecurity:   °• Worried About Running Out of Food in the Last Year: Not on file  °• Ran Out of Food in the Last Year: Not on file  °Transportation Needs:   °• Lack of Transportation (Medical): Not on file  °• Lack of Transportation (Non-Medical): Not on file  °Physical Activity:   °• Days of Exercise per Week: Not on file  °• Minutes of Exercise per Session: Not on file  °Stress:   °• Feeling of Stress : Not on file  °Social Connections:   °• Frequency of Communication with Friends and Family: Not on file  °• Frequency of Social Gatherings with Friends and Family: Not on file  °• Attends Religious Services: Not on file  °• Active Member of Clubs or Organizations: Not on file  °• Attends Club  or Organization Meetings: Not on file  °• Marital Status: Not on file  °Intimate Partner Violence:   °• Fear of Current or Ex-Partner: Not on file  °• Emotionally Abused: Not on file  °• Physically Abused: Not on file  °•   Sexually Abused: Not on file  ° ° °Review of Systems: °Unobtainable. Patient has some confusion.  ° °OBJECTIVE:  ° ° °Physical Exam: °Vital signs in last 24 hours: °Temp:  [98 °F (36.7 °C)-98.8 °F (37.1 °C)] 98.8 °F (37.1 °C) (09/19 1135) °Pulse Rate:  [83-109] 83 (09/19 1135) °Resp:  [18-23] 18 (09/19 1135) °BP: (101-121)/(60-79) 104/69 (09/19 1135) °SpO2:  [93 %-98 %] 98 % (09/19 1135) °Last BM Date: 11/07/19 °General:   Alert, well-developed,  male in NAD °Psych:  Pleasant, cooperative. Normal mood and affect. °Eyes:  Pupils equal, sclera clear, no icterus.   Conjunctiva pink. °Ears:  Normal auditory acuity. °Nose:  No deformity, discharge,  or lesions. °Neck:  Supple; no masses °Lungs:  Poor inspiratory effort  Occasional wheezing throughout. .  °Heart:  Regular rate, irreg rhythm °Abdomen:  Soft, non-distended, nontender, BS active, no palp mass   °Rectal:  Deferred  °Msk: Right BKA °Neurologic:  Alert,oriented to place. Confused at times.  °Skin:  Intact without significant lesions or rashes. ° °Filed Weights  ° 11/05/19 1126 11/07/19 0616  °Weight: 77.1 kg 79.7 kg  ° ° ° °Scheduled inpatient medications °• sodium chloride   Intravenous Once  °• sodium chloride   Intravenous Once  °• amitriptyline  20 mg Oral QHS  °• aspirin EC  81 mg Oral Q breakfast  °• Chlorhexidine Gluconate Cloth  6 each Topical Daily  °• clopidogrel  75 mg Oral Daily  °• collagenase   Topical Daily  °• diltiazem  60 mg Oral Q6H  °• ferrous sulfate  325 mg Oral Q breakfast  °• heparin  5,000 Units Subcutaneous Q8H  °• insulin aspart  0-15 Units Subcutaneous TID WC  °• insulin aspart  0-5 Units Subcutaneous QHS  °• insulin glargine  20 Units Subcutaneous QHS  °• levothyroxine  100 mcg Oral Q breakfast  °• metoprolol  tartrate  25 mg Oral BID  °• multivitamin with minerals  1 tablet Oral Daily  °• pantoprazole  40 mg Oral Daily  °• potassium chloride  40 mEq Oral Q6H  °• pravastatin  10 mg Oral Q supper  °• primidone  150 mg Oral QHS  °• tobramycin  2 drop Both Eyes Q4H while awake  ° ° ° ° °Intake/Output from previous day: °09/18 0701 - 09/19 0700 °In: 3042 [P.O.:480; I.V.:2068; Blood:342; IV Piggyback:152] °Out: 970 [Urine:970] °Intake/Output this shift: °No intake/output data recorded. ° ° °Lab Results: °Recent Labs  °  11/06/19 °0303 11/06/19 °0303 11/06/19 °2054 11/07/19 °1001 11/08/19 °0802  °WBC 10.5  --   --  10.3 10.4  °HGB 6.9*   < > 7.0* 8.3* 8.5*  °HCT 22.8*   < > 23.5* 27.0* 27.8*  °PLT 606*  --   --  681* 675*  ° < > = values in this interval not displayed.  ° °BMET °Recent Labs  °  11/06/19 °0303 11/07/19 °1001 11/08/19 °0802  °NA 132* 133* 133*  °K 4.2 3.5 3.4*  °CL 105 106 107  °CO2 21* 17* 17*  °GLUCOSE 152* 207* 125*  °BUN 18 15 11  °CREATININE 0.87 0.98 0.97  °CALCIUM 7.6* 7.9* 7.9*  ° °LFT °Recent Labs  °  11/08/19 °0802  °PROT 6.0*  °ALBUMIN 1.9*  °AST 18  °ALT 17  °ALKPHOS 124  °BILITOT 0.5  ° °PT/INR °No results for input(s): LABPROT, INR in the last 72 hours. °Hepatitis Panel °No results for input(s): HEPBSAG, HCVAB, HEPAIGM, HEPBIGM in the last 72 hours. ° ° °. °  CBC Latest Ref Rng & Units 11/08/2019 11/07/2019 11/06/2019  °WBC 4.0 - 10.5 K/uL 10.4 10.3 -  °Hemoglobin 13.0 - 17.0 g/dL 8.5(L) 8.3(L) 7.0(L)  °Hematocrit 39 - 52 % 27.8(L) 27.0(L) 23.5(L)  °Platelets 150 - 400 K/uL 675(H) 681(H) -  ° ° °. °CMP Latest Ref Rng & Units 11/08/2019 11/07/2019 11/06/2019  °Glucose 70 - 99 mg/dL 125(H) 207(H) 152(H)  °BUN 8 - 23 mg/dL 11 15 18  °Creatinine 0.61 - 1.24 mg/dL 0.97 0.98 0.87  °Sodium 135 - 145 mmol/L 133(L) 133(L) 132(L)  °Potassium 3.5 - 5.1 mmol/L 3.4(L) 3.5 4.2  °Chloride 98 - 111 mmol/L 107 106 105  °CO2 22 - 32 mmol/L 17(L) 17(L) 21(L)  °Calcium 8.9 - 10.3 mg/dL 7.9(L) 7.9(L) 7.6(L)  °Total Protein 6.5  - 8.1 g/dL 6.0(L) - 6.3(L)  °Total Bilirubin 0.3 - 1.2 mg/dL 0.5 - 0.6  °Alkaline Phos 38 - 126 U/L 124 - 131(H)  °AST 15 - 41 U/L 18 - 28  °ALT 0 - 44 U/L 17 - 29  ° °Studies/Results: °MR FOOT LEFT W WO CONTRAST ° °Result Date: 11/08/2019 °CLINICAL DATA:  Osteomyelitis of the foot. Plain foot soft tissue wound EXAM: MRI OF THE LEFT FOREFOOT WITHOUT AND WITH CONTRAST TECHNIQUE: Multiplanar, multisequence MR imaging of the left foot was performed both before and after administration of intravenous contrast. CONTRAST:  8mL GADAVIST GADOBUTROL 1 MMOL/ML IV SOLN COMPARISON:  None. FINDINGS: TENDONS Peroneal: Peroneal longus tendon intact. Peroneal brevis intact. Posteromedial: Posterior tibial tendon intact. Flexor hallucis longus tendon intact. Flexor digitorum longus tendon intact. Anterior: Tibialis anterior tendon intact. Extensor hallucis longus tendon intact Extensor digitorum longus tendon intact. Achilles:  Mild tendinosis of the distal Achilles tendon. Plantar Fascia: Thickening of the medial band of the plantar fascia at the calcaneal insertion as can be seen with mild plantar fasciitis. LIGAMENTS Lateral: Anterior talofibular ligament intact. Calcaneofibular ligament intact. Posterior talofibular ligament intact. Anterior and posterior tibiofibular ligaments intact. Medial: Deltoid ligament intact. Spring ligament intact. CARTILAGE Ankle Joint: No joint effusion. Normal ankle mortise. No chondral defect. Subtalar Joints/Sinus Tarsi: Normal subtalar joints. No subtalar joint effusion. Normal sinus tarsi. Bones/Soft Tissue: Soft tissue wound along the posterolateral aspect of the hindfoot overlying the posterior calcaneus. Underlying cortical destruction of the posterolateral calcaneus just inferior to the Achilles insertion with severe surrounding marrow edema most consistent with osteomyelitis. No drainable fluid collection. Soft tissue edema throughout the hindfoot extending into the ankle concerning for  cellulitis. T2 hyperintense signal throughout the plantar musculature likely neurogenic. IMPRESSION: 1. Soft tissue wound along the posterolateral aspect of the hindfoot overlying the posterior calcaneus. Underlying cortical destruction of the posterolateral calcaneus just inferior to the Achilles insertion with severe surrounding marrow edema most consistent with osteomyelitis with surrounding cellulitis. No drainable fluid collection. 2. Mild tendinosis of the distal Achilles tendon. 3. Thickening of the medial band of the plantar fascia at the calcaneal insertion as can be seen with mild plantar fasciitis. Electronically Signed   By: Hetal  Patel   On: 11/08/2019 11:46  ° ° °Active Problems: °  HLD (hyperlipidemia) °  Chronic anemia °  Hypothyroidism °  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 ([name]) °  Sepsis ([name]) ° ° ° °Infant Zink, NP-C @  11/08/2019, 2:00 PM ° ° ° ° °

## 2019-11-08 NOTE — Progress Notes (Addendum)
Progress Note  Patient Name: Anthony Dunlap Date of Encounter: 11/08/2019  Primary Cardiologist: Carlyle Dolly, MD   Subjective   HR much improved after addition of Lopressor.  Denies any chest pain or SOB  Inpatient Medications    Scheduled Meds: . sodium chloride   Intravenous Once  . sodium chloride   Intravenous Once  . amitriptyline  20 mg Oral QHS  . aspirin EC  81 mg Oral Q breakfast  . Chlorhexidine Gluconate Cloth  6 each Topical Daily  . clopidogrel  75 mg Oral Daily  . collagenase   Topical Daily  . ferrous sulfate  325 mg Oral Q breakfast  . heparin  5,000 Units Subcutaneous Q8H  . insulin aspart  0-15 Units Subcutaneous TID WC  . insulin aspart  0-5 Units Subcutaneous QHS  . insulin glargine  20 Units Subcutaneous QHS  . levothyroxine  100 mcg Oral Q breakfast  . metoprolol tartrate  25 mg Oral BID  . multivitamin with minerals  1 tablet Oral Daily  . pantoprazole  40 mg Oral Daily  . pravastatin  10 mg Oral Q supper  . primidone  150 mg Oral QHS  . tobramycin  2 drop Both Eyes Q4H while awake   Continuous Infusions: . sodium chloride 50 mL/hr at 11/08/19 0519  . ampicillin-sulbactam (UNASYN) IV 3 g (11/08/19 0522)  . DAPTOmycin (CUBICIN)  IV 228 mL/hr at 11/07/19 1500  . diltiazem (CARDIZEM) infusion 10 mg/hr (11/08/19 0524)   PRN Meds: acetaminophen **OR** acetaminophen, colchicine, HYDROcodone-acetaminophen   Vital Signs    Vitals:   11/07/19 1950 11/08/19 0003 11/08/19 0349 11/08/19 0746  BP: 107/60 115/78 120/76 109/72  Pulse: 89 93 (!) 109 98  Resp: 20 (!) 22 20 20   Temp: 98.7 F (37.1 C) 98.7 F (37.1 C) 98 F (36.7 C) 98.2 F (36.8 C)  TempSrc: Oral Oral Oral Oral  SpO2: 93%  95% 98%  Weight:      Height:        Intake/Output Summary (Last 24 hours) at 11/08/2019 0835 Last data filed at 11/08/2019 0351 Gross per 24 hour  Intake 2801.99 ml  Output 970 ml  Net 1831.99 ml   Filed Weights   11/05/19 1126 11/07/19 0616  Weight:  77.1 kg 79.7 kg    Telemetry    Atrial fibrillation with CVR- Personally Reviewed  ECG    No new EKG to review - Personally Reviewed  Physical Exam   GEN: Well nourished, well developed in no acute distress HEENT: Normal NECK: No JVD; No carotid bruits LYMPHATICS: No lymphadenopathy CARDIAC:irregularly irregular, no murmurs, rubs, gallops RESPIRATORY:  Clear to auscultation without rales, wheezing or rhonchi  ABDOMEN: Soft, non-tender, non-distended MUSCULOSKELETAL:  No edema; No deformity  SKIN: Warm and dry NEUROLOGIC:  Alert and oriented x 3 PSYCHIATRIC:  Normal affect    Labs    Chemistry Recent Labs  Lab 11/05/19 1203 11/06/19 0303 11/07/19 1001  NA 127* 132* 133*  K 3.2* 4.2 3.5  CL 95* 105 106  CO2 19* 21* 17*  GLUCOSE 139* 152* 207*  BUN 23 18 15   CREATININE 1.04 0.87 0.98  CALCIUM 8.2* 7.6* 7.9*  PROT 7.0 6.3*  --   ALBUMIN 2.6* 2.2*  --   AST 43* 28  --   ALT 33 29  --   ALKPHOS 150* 131*  --   BILITOT 0.7 0.6  --   GFRNONAA >60 >60 >60  GFRAA >60 >60 >60  ANIONGAP  13 6 10      Hematology Recent Labs  Lab 11/05/19 1203 11/05/19 1203 11/06/19 0303 11/06/19 2054 11/07/19 1001  WBC 11.0*  --  10.5  --  10.3  RBC 2.97*  --  2.74*  --  3.22*  HGB 7.4*   < > 6.9* 7.0* 8.3*  HCT 24.4*   < > 22.8* 23.5* 27.0*  MCV 82.2  --  83.2  --  83.9  MCH 24.9*  --  25.2*  --  25.8*  MCHC 30.3  --  30.3  --  30.7  RDW 14.5  --  14.6  --  15.0  PLT 622*  --  606*  --  681*   < > = values in this interval not displayed.    Cardiac EnzymesNo results for input(s): TROPONINI in the last 168 hours. No results for input(s): TROPIPOC in the last 168 hours.   BNPNo results for input(s): BNP, PROBNP in the last 168 hours.   DDimer No results for input(s): DDIMER in the last 168 hours.   Radiology    ECHOCARDIOGRAM COMPLETE  Result Date: 11/06/2019    ECHOCARDIOGRAM REPORT   Patient Name:   Anthony Dunlap Date of Exam: 11/06/2019 Medical Rec #:  [id number]       Height:       68.0 in Accession #:    [id number]     Weight:       170.0 lb Date of Birth:  19-Jan-1934      BSA:          1.907 m Patient Age:    86 years       BP:           103/75 mmHg Patient Gender: M              HR:           78 bpm. Exam Location:  [facility] Procedure: 2D Echo, Cardiac Doppler and Color Doppler Indications:    Prostate Cancer  History:        Patient has prior history of Echocardiogram examinations, most                 recent 01/23/2018. Risk Factors:Dyslipidemia and Diabetes.                 Prostate Cancer.  Sonographer:    Alvino Chapel RCS Referring Phys: [id number] DAWOOD S Waldron Labs  Sonographer Comments: Technically difficult study due to poor echo windows. IMPRESSIONS  1. Left ventricular ejection fraction, by estimation, is 60 to 65%. The left ventricle has normal function. Left ventricular endocardial border not optimally defined to evaluate regional wall motion. There is mild left ventricular hypertrophy. Left ventricular diastolic parameters are indeterminate.  2. Right ventricular systolic function is normal. The right ventricular size is normal.  3. Left atrial size was mildly dilated.  4. The pericardial effusion is circumferential.  5. The mitral valve is normal in structure. Trivial mitral valve regurgitation. No evidence of mitral stenosis.  6. The aortic valve was not well visualized. Aortic valve regurgitation is not visualized. No aortic stenosis is present. FINDINGS  Left Ventricle: Left ventricular ejection fraction, by estimation, is 60 to 65%. The left ventricle has normal function. Left ventricular endocardial border not optimally defined to evaluate regional wall motion. The left ventricular internal cavity size was normal in size. There is mild left ventricular hypertrophy. Left ventricular diastolic parameters are indeterminate. Right Ventricle: The right ventricular size is normal. No  increase in right ventricular wall thickness. Right ventricular systolic function  is normal. Left Atrium: Left atrial size was mildly dilated. Right Atrium: Right atrial size was normal in size. Pericardium: Trivial pericardial effusion is present. The pericardial effusion is circumferential. Mitral Valve: The mitral valve is normal in structure. Trivial mitral valve regurgitation. No evidence of mitral valve stenosis. Tricuspid Valve: The tricuspid valve is normal in structure. Tricuspid valve regurgitation is not demonstrated. No evidence of tricuspid stenosis. Aortic Valve: The aortic valve was not well visualized. Aortic valve regurgitation is not visualized. No aortic stenosis is present. Aortic valve mean gradient measures 3.3 mmHg. Aortic valve peak gradient measures 7.2 mmHg. Aortic valve area, by VTI measures 2.80 cm. Pulmonic Valve: The pulmonic valve was not well visualized. Pulmonic valve regurgitation is not visualized. No evidence of pulmonic stenosis. Aorta: The aortic root is normal in size and structure. Pulmonary Artery: Indeterminant PASP, inadequate TR jet. Venous: The inferior vena cava was not well visualized. IAS/Shunts: The interatrial septum was not well visualized.  LEFT VENTRICLE PLAX 2D LVIDd:         4.18 cm LVIDs:         2.44 cm LV PW:         1.28 cm LV IVS:        1.12 cm LVOT diam:     2.00 cm LV SV:         56 LV SV Index:   29 LVOT Area:     3.14 cm  RIGHT VENTRICLE RV S prime:     13.00 cm/s TAPSE (M-mode): 1.4 cm LEFT ATRIUM             Index       RIGHT ATRIUM           Index LA diam:        2.60 cm 1.36 cm/m  RA Area:     15.90 cm LA Vol (A2C):   57.8 ml 30.30 ml/m RA Volume:   38.20 ml  20.03 ml/m LA Vol (A4C):   58.6 ml 30.72 ml/m LA Biplane Vol: 59.0 ml 30.93 ml/m  AORTIC VALVE AV Area (Vmax):    2.36 cm AV Area (Vmean):   2.29 cm AV Area (VTI):     2.80 cm AV Vmax:           134.16 cm/s AV Vmean:          83.194 cm/s AV VTI:            0.199 m AV Peak Grad:      7.2 mmHg AV Mean Grad:      3.3 mmHg LVOT Vmax:         100.60 cm/s LVOT Vmean:         60.700 cm/s LVOT VTI:          0.178 m LVOT/AV VTI ratio: 0.89  AORTA Ao Root diam: 3.00 cm MITRAL VALVE                TRICUSPID VALVE MV Area (PHT): 3.61 cm     TR Peak grad:   22.3 mmHg MV Decel Time: 210 msec     TR Vmax:        236.00 cm/s MV E velocity: 123.00 cm/s                             SHUNTS  Systemic VTI:  0.18 m                             Systemic Diam: 2.00 cm Carlyle Dolly MD Electronically signed by Carlyle Dolly MD Signature Date/Time: 11/06/2019/11:30:42 AM    Final     Cardiac Studies   2D echo 11/06/2019 IMPRESSIONS    1. Left ventricular ejection fraction, by estimation, is 60 to 65%. The  left ventricle has normal function. Left ventricular endocardial border  not optimally defined to evaluate regional wall motion. There is mild left  ventricular hypertrophy. Left  ventricular diastolic parameters are indeterminate.  2. Right ventricular systolic function is normal. The right ventricular  size is normal.  3. Left atrial size was mildly dilated.  4. The pericardial effusion is circumferential.  5. The mitral valve is normal in structure. Trivial mitral valve  regurgitation. No evidence of mitral stenosis.  6. The aortic valve was not well visualized. Aortic valve regurgitation  is not visualized. No aortic stenosis is present.   Patient Profile     86 y.o. 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  Assessment & Plan    1.  Atrial fibrillation with RVR  -likely triggered by fever and sepsis -unknown duration -TSH controlled -HR much improved after addition of Lopressor to IV Cardizem gtt (HR 90-100) -CHA2DS2-VASc = 4 (age x 2, PAD, DM) but not started on oral anticoagulation due to anemia -IV Heparin gtt off  until anemia workup completed (Hbg 7 this am) after transfusion -Change Cardizem to PO 60mg  q6hrs (he is currently on Cardizem gtt at 10mg /hr) and continue Lopressor 25mg  BID -titrate BB as needed for HR control -start anticoagulation once felt to be safe by TR[facility]  2.  Pericardial effusion - small effusion by CT scan with report indicating possible complex/exudative effusion. Also with small bilateral pleural effusions not impressive at all by echo, though limited visualiation in the subcostal windows.  - at this time no further workup recommended for trivial/circumferential effusion.  - dont need to hold plavix for effusion, defer to primary team if want to hold for anemia. He is on it for PAD history, no cardiac indication.   3.  Anemia -Hbg low as 6.9>7>8.3  -felt partly to be dilutional after getting 3L fluid for sepsis -per TRH  4.  Sepsis -per Primary team  For questions or updates, please contact [place] Please consult [email or web address] for contact info under Cardiology/STEMI.      Signed, Fransico Him, MD  11/08/2019, 8:35 AM

## 2019-11-08 NOTE — Consult Note (Addendum)
Referring Provider:  [facility]         Primary Care Physician:  [name], MD Primary Gastroenterologist: ? Dr. Laural Golden ( remotely)            We were asked to see this patient for:   Anemia and dark stools             Attending physician's note   I have taken a history, examined the patient and reviewed the chart. I agree with the Advanced Practitioner's note, impression and recommendations.  86 year old man with multiple comorbidities including peripheral vascular disease, osteomyelitis s/p BKA on dual antiplatelet therapy with aspirin and Plavix with chronic anemia requiring intermittent blood transfusions  New onset A. fib with RVR, anticoagulation currently on hold  Will plan to proceed with EGD to exclude any high risk lesions.  Last dose of Plavix and aspirin this morning.  We will hold Plavix dose tomorrow.  Not adequate time for Plavix washout but diagnostic EGD likely will be low risk for bleeding  He is ordered for fecal Hemoccult cards, will follow up results though is less likely to change the management at this point  Abdominal distention with increased stool burden Replete potassium >4 and magnesium >2 Turn in bed every 4 hours and out of bed to chair as tolerated Bowel regimen with daily MiraLAX with goal 1-2 soft bowel movements daily   The patient was provided an opportunity to ask questions and all were answered. The patient agreed with the plan and demonstrated an understanding of the instructions.  Damaris Hippo , MD [phone]    ASSESSMENT / PLAN:    # Chronic [place] anemia / slight drop in hgb since August. Dark stools (on iron). Appears to be on plavix and asa.  --Plavix and asa on home med list. Patient nor daughter could confirm that he takes them.  --Baseline hgb had to discern with hospitalizations / surgeries. It was 7.4 on 09/30/19. In admission is was also 7.4. However after IV fluids hgb declined to 6.9, improved to 8.5 post one unit  of blood.  --Hemoccult cards are pending.  --Will proceed with EGD to rule out any high risk lesions. He is on plavix but will at least hold tomorrow's dose. I spoke with daughter [name] regarding the risks / benefit of the procedure and she agrees to proceed.   # Abdominal distention --CT scan abd / pelvis w/ contrast 3 days ago negative for obstruction, markedly redundant sigmoid colon, moderate stool within ascending and transverse colon.  --Patient does complain of lower abdominal discomfort but description is vague. He is a limited historian.  --Follow up KUB today. --Start daily miralax.   # New Afib with RVR --rate controlled. Cardiology holding off on starting anticoagulant due to anemia and sepsis.   # Sepsis, like secondary to left heel osteomyelitis   # Small pericardial effusion, felt exudative  # Severe PVD, ? Reason he is on plavix  # Chronic pelvic / hip osteomyelitis. On chronic antibiotics with Infectious Disease.    HPI:  Chief Complaint: abdominal discomfort   History mainly comes from chart and daughter [name]. Patient has some confusion. Says he lives with his wife ( she passed away a couple of weeks ago). He is struggling to provide a history.   Anthony Dunlap is a 86 y.o. male with a pmh significant for, not necessarily limited to: hypertension, hyperlipidemia, PVD s/p recent BKA, thyroid disease, prostate cancer, hx of drug induced liver disease ( fluconazole), diabetes, chronic pelvic / hip osteomyelitis on antibiotics through ID, chronic anemia.   Patient brought to ED 9/16 after home health nurse found him febrile. He met sepsis criteria on admission. Also found to have new Afib with RVR and a pericardial effusion. Cardiology evaluated. Rate controlled now. Anticoagulation indicated but not yet started due to anemia and sepsis.     Patient  endorses lower abdominal discomfort but can be descriptive other than to say it is a "drawing" pain. Spoke with daughter [name]. Last week she noticed patient's stool being watery and black. Patient says he usually has to take something to have a bm ( senna on home med list) but lately his stools have been watery. Daughter doesn't know when / if patient has ever had a colonoscopy.   PREVIOUS ENDOSCOPIC EVALUATIONS / PERTINENT STUDIES   Unknown. None found in Epic  Past Medical History:  Diagnosis Date   Chronic heel ulcer, left, limited to breakdown of skin ([place]) 10/28/2019   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 ([place]) 04/21/2018   Diabetes ([place])    Drug-induced hepatitis 06/05/2018   Inguinal hernia    06/09/2019: per patient " a long time ago on the right side"   Left eye pain    Localized osteoarthrosis of right hip 01/05/2019   Prostate cancer [facility])     Past Surgical History:  Procedure Laterality Date   ABDOMINAL AORTOGRAM W/LOWER EXTREMITY Bilateral 08/12/2019   Procedure: ABDOMINAL AORTOGRAM W/LOWER EXTREMITY;  Surgeon: Marty Heck, MD;  Location: [place] [facility];  Service: Cardiovascular;  Laterality: Bilateral;   ABDOMINAL AORTOGRAM W/LOWER EXTREMITY Left 08/19/2019   Procedure: ABDOMINAL AORTOGRAM W/LOWER EXTREMITY;  Surgeon: Marty Heck, MD;  Location: [place] [facility];  Service: Cardiovascular;  Laterality: Left;   AMPUTATION Right 09/28/2019   Procedure: AMPUTATION BELOW KNEE;  Surgeon: Rosetta Posner, MD;  Location: [facility];  Service: Vascular;  Laterality: Right;   HERNIA REPAIR     IR RADIOLOGIST EVAL & MGMT  03/06/2018   PERIPHERAL VASCULAR BALLOON ANGIOPLASTY Left 08/19/2019   Procedure: PERIPHERAL VASCULAR BALLOON ANGIOPLASTY;  Surgeon: Marty Heck, MD;  Location: [place] [facility];  Service: Cardiovascular;  Laterality: Left;   PROSTATE SURGERY     TOTAL HIP ARTHROPLASTY Right 06/12/2019   Procedure: RIGHT TOTAL  HIP ARTHROPLASTY DIRECT ANTERIOR;  Surgeon: Marybelle Killings, MD;  Location: [place];  Service: Orthopedics;  Laterality: Right;    Prior to Admission medications   Medication Sig Start Date End Date Taking? Authorizing Provider  acetaminophen (TYLENOL) 500 MG tablet Take 1,000 mg by mouth every 6 (six) hours as needed for mild pain or moderate pain.    [provider]  ALPRAZolam Duanne Moron) 0.5 MG tablet Take 1 tablet (0.5 mg total) by mouth 2 (two) times daily as needed for anxiety. 09/30/19   Geradine Girt, DO  amitriptyline (ELAVIL) 10 MG tablet Take 20 mg by mouth at bedtime.  11/28/18   [provider]  amoxicillin-clavulanate (AUGMENTIN) 875-125 MG tablet TAKE 1  TABLET BY MOUTH TWICE DAILY Patient taking differently: Take 1 tablet by mouth 2 (two) times daily.  06/12/19   Truman Hayward, MD  aspirin EC 81 MG tablet Take 81 mg by mouth daily with breakfast. Swallow whole.     [provider]  ciprofloxacin (CIPRO) 500 MG tablet Take 500 mg by mouth 2 (two) times daily. 11/02/19   [provider]  clopidogrel (PLAVIX) 75 MG tablet Take 1 tablet (75 mg total) by mouth daily. Patient not taking: Reported on 10/28/2019 08/19/19 08/18/20  Marty Heck, MD  colchicine 0.6 MG tablet Take 0.6 mg by mouth daily as needed (gout attacks).  07/27/19   [provider]  ferrous sulfate 325 (65 FE) MG tablet Take 1 tablet (325 mg total) by mouth 2 (two) times daily with a meal. 09/30/19   Eliseo Squires, Tomi Bamberger, DO  furosemide (LASIX) 20 MG tablet Take 1 tablet (20 mg total) by mouth daily with breakfast. Patient taking differently: Take 20 mg by mouth daily as needed for fluid.  10/02/19   Geradine Girt, DO  glimepiride (AMARYL) 4 MG tablet Take 4 mg by mouth daily with breakfast.  04/18/18   [provider]  insulin aspart (NOVOLOG) 100 UNIT/ML injection Inject 0-15 Units into the skin 3 (three) times daily with meals. Sliding scale insulin Less than 70  initiate hypoglycemia protocol 70-120  0 units 120-150 2 unit 151-200 3 units 201-250 3 units 251-300 5 units 301-350 8 units 351-400 11 units  Greater than 400 15 units , call MD Patient taking differently: Inject 0-16 Units into the skin See admin instructions. Inject 0-16 units subcutaneously three times daily with meals per sliding scale: CBG 70-120 0 units 120-150 4 units, 151-200 5 units, 201-250 6 units, 251-300 7 units, 301-350 10 units, 351-400 13 units, >400 16 units and call MD (Less than 70 initiate hypoglycemia protocol) 02/18/18   Oswald Hillock, MD  Insulin Glargine-Lixisenatide (SOLIQUA) 100-33 UNT-MCG/ML SOPN Inject 40 Units into the skin daily after breakfast.    [provider]  levothyroxine (SYNTHROID, LEVOTHROID) 100 MCG tablet Take 100 mcg by mouth daily with breakfast.  04/18/18   [provider]  meloxicam (MOBIC) 7.5 MG tablet Take 7.5 mg by mouth 2 (two) times daily. 11/01/19   [provider]  Multiple Vitamin (MULTIVITAMIN WITH MINERALS) TABS tablet Take 1 tablet by mouth daily. 09/30/19   Geradine Girt, DO  nutrition supplement, JUVEN, (JUVEN) PACK Take 1 packet by mouth 2 (two) times daily between meals. 09/30/19   Geradine Girt, DO  Nutritional Supplements (,FEEDING SUPPLEMENT, PROSOURCE PLUS) liquid Take 30 mLs by mouth 2 (two) times daily with a meal. 09/30/19   Geradine Girt, DO  nystatin (MYCOSTATIN/NYSTOP) powder Apply topically 4 (four) times daily. Patient taking differently: Apply 1 application topically daily as needed (yeast).  03/03/19   Truman Hayward, MD  oxyCODONE (OXY IR/ROXICODONE) 5 MG immediate release tablet Take 1 tablet (5 mg total) by mouth every 4 (four) hours as needed for moderate pain. 09/30/19   Geradine Girt, DO  pantoprazole (PROTONIX) 40 MG tablet Take 1 tablet (40 mg total) by mouth daily. 09/30/19   Geradine Girt, DO  pravastatin (PRAVACHOL) 10 MG tablet Take 10 mg by mouth daily with supper.      [provider]  primidone (MYSOLINE) 50 MG tablet Take 150 mg by mouth at bedtime.  02/26/19   [provider]  senna-docusate (  SENOKOT-S) 8.6-50 MG tablet Take 1 tablet by mouth 2 (two) times daily. 09/30/19   Geradine Girt, DO  silver sulfADIAZINE (SILVADENE) 1 % cream Apply topically 2 (two) times daily. 09/30/19   Geradine Girt, DO  sulfamethoxazole-trimethoprim (BACTRIM DS) 800-160 MG tablet Take 1 tablet by mouth 2 (two) times daily. 11/04/19   [provider]    Current Facility-Administered Medications  Medication Dose Route Frequency Provider Last Rate Last Admin   0.9 %  sodium chloride infusion (Manually program via Guardrails IV Fluids)   Intravenous Once Zierle-Ghosh, Asia B, DO       0.9 %  sodium chloride infusion (Manually program via Guardrails IV Fluids)   Intravenous Once Shela Leff, MD       0.9 %  sodium chloride infusion   Intravenous Continuous Elgergawy, Silver Huguenin, MD 50 mL/hr at 11/08/19 0519 New Bag at 11/08/19 0519   acetaminophen (TYLENOL) tablet 650 mg  650 mg Oral Q6H PRN Elgergawy, Silver Huguenin, MD   650 mg at 11/06/19 0050   Or   acetaminophen (TYLENOL) suppository 650 mg  650 mg Rectal Q6H PRN Elgergawy, Silver Huguenin, MD       ALPRAZolam Duanne Moron) tablet 0.5 mg  0.5 mg Oral BID PRN Bonnell Public Tublu, MD       amitriptyline (ELAVIL) tablet 20 mg  20 mg Oral QHS Elgergawy, Silver Huguenin, MD   20 mg at 11/07/19 2251   Ampicillin-Sulbactam (UNASYN) 3 g in sodium chloride 0.9 % 100 mL IVPB  3 g Intravenous Q6H Tommy Medal, Lavell Islam, MD 200 mL/hr at 11/08/19 1259 3 g at 11/08/19 1259   aspirin EC tablet 81 mg  81 mg Oral Q breakfast Elgergawy, Silver Huguenin, MD   81 mg at 11/08/19 0853   Chlorhexidine Gluconate Cloth 2 % PADS 6 each  6 each Topical Daily Irwin Brakeman L, MD   6 each at 11/08/19 0910   clopidogrel (PLAVIX) tablet 75 mg  75 mg Oral Daily Johnson, Clanford L, MD   75 mg at 11/08/19 6295   colchicine tablet 0.6 mg  0.6  mg Oral Daily PRN Murlean Iba, MD       collagenase (SANTYL) ointment   Topical Daily Arnoldo Lenis, MD   Given at 11/08/19 0907   DAPTOmycin (CUBICIN) 700 mg in sodium chloride 0.9 % IVPB  700 mg Intravenous Q2000 Tommy Medal, Lavell Islam, MD 228 mL/hr at 11/07/19 1500 Rate Verify at 11/07/19 1500   diltiazem (CARDIZEM) tablet 60 mg  60 mg Oral Q6H Turner, Traci R, MD       ferrous sulfate tablet 325 mg  325 mg Oral Q breakfast Johnson, Clanford L, MD   325 mg at 11/08/19 0853   heparin injection 5,000 Units  5,000 Units Subcutaneous Q8H Elgergawy, Silver Huguenin, MD   5,000 Units at 11/08/19 1253   HYDROcodone-acetaminophen (NORCO/VICODIN) 5-325 MG per tablet 1-2 tablet  1-2 tablet Oral Q4H PRN Elgergawy, Silver Huguenin, MD       insulin aspart (novoLOG) injection 0-15 Units  0-15 Units Subcutaneous TID WC Elgergawy, Silver Huguenin, MD   2 Units at 11/08/19 1253   insulin aspart (novoLOG) injection 0-5 Units  0-5 Units Subcutaneous QHS Elgergawy, Silver Huguenin, MD   3 Units at 11/07/19 2252   insulin glargine (LANTUS) injection 20 Units  20 Units Subcutaneous QHS Elgergawy, Silver Huguenin, MD   20 Units at 11/07/19 2252   levothyroxine (SYNTHROID) tablet 100 mcg  100 mcg Oral  Q breakfast Elgergawy, Silver Huguenin, MD   100 mcg at 11/08/19 0853   magnesium sulfate IVPB 2 g 50 mL  2 g Intravenous Once Bonnell Public Tublu, MD       metoprolol tartrate (LOPRESSOR) tablet 25 mg  25 mg Oral BID Sueanne Margarita, MD   25 mg at 11/08/19 2947   multivitamin with minerals tablet 1 tablet  1 tablet Oral Daily Elgergawy, Silver Huguenin, MD   1 tablet at 11/08/19 0853   pantoprazole (PROTONIX) EC tablet 40 mg  40 mg Oral Daily Tommy Medal, Lavell Islam, MD   40 mg at 11/08/19 6546   potassium chloride SA (KLOR-CON) CR tablet 40 mEq  40 mEq Oral Q6H Bonnell Public Tublu, MD       pravastatin (PRAVACHOL) tablet 10 mg  10 mg Oral Q supper Elgergawy, Silver Huguenin, MD   10 mg at 11/07/19 1638   primidone (MYSOLINE) tablet 150 mg   150 mg Oral QHS Elgergawy, Silver Huguenin, MD   150 mg at 11/07/19 2251   tobramycin (TOBREX) 0.3 % ophthalmic solution 2 drop  2 drop Both Eyes Q4H while awake Elgergawy, Silver Huguenin, MD   2 drop at 11/08/19 1254    Allergies as of 11/05/2019 - Review Complete 11/05/2019  Allergen Reaction Noted   Fluconazole Other (See Comments) 06/05/2018    Family History  Problem Relation Age of Onset   Pneumonia Father    Cancer Sister     Social History   Socioeconomic History   Marital status: Widowed    Spouse name: [name]   Number of children: 3   Years of education: 9th   Highest education level: Not on file  Occupational History    Employer: RETIRED    Comment: Retired  Tobacco Use   Smoking status: Never Smoker   Smokeless tobacco: Never Used  Scientific laboratory technician Use: Never used  Substance and Sexual Activity   Alcohol use: No   Drug use: No   Sexual activity: Not on file  Other Topics Concern   Not on file  Social History Narrative   Patient lives at home with his wife. [name]) . Patient is retired.   Education 9th grade.   Right handed.   Caffeine None   Social Determinants of Health   Financial Resource Strain:    Difficulty of Paying Living Expenses: Not on file  Food Insecurity:    Worried About Charity fundraiser in the Last Year: Not on file   [employer] of Food in the Last Year: Not on file  Transportation Needs:    Lack of Transportation (Medical): Not on file   Lack of Transportation (Non-Medical): Not on file  Physical Activity:    Days of Exercise per Week: Not on file   Minutes of Exercise per Session: Not on file  Stress:    Feeling of Stress : Not on file  Social Connections:    Frequency of Communication with Friends and Family: Not on file   Frequency of Social Gatherings with Friends and Family: Not on file   Attends Religious Services: Not on file   Active Member of Clubs or Organizations: Not on file   Attends English as a second language teacher Meetings: Not on file   Marital Status: Not on file  Intimate Partner Violence:    Fear of Current or Ex-Partner: Not on file   Emotionally Abused: Not on file   Physically Abused: Not on file  Sexually Abused: Not on file    Review of Systems: Unobtainable. Patient has some confusion.   OBJECTIVE:    Physical Exam: Vital signs in last 24 hours: Temp:  [98 F (36.7 C)-98.8 F (37.1 C)] 98.8 F (37.1 C) (09/19 1135) Pulse Rate:  [83-109] 83 (09/19 1135) Resp:  [18-23] 18 (09/19 1135) BP: (101-121)/(60-79) 104/69 (09/19 1135) SpO2:  [93 %-98 %] 98 % (09/19 1135) Last BM Date: 11/07/19 General:   Alert, well-developed,  male in NAD Psych:  Pleasant, cooperative. Normal mood and affect. Eyes:  Pupils equal, sclera clear, no icterus.   Conjunctiva pink. Ears:  Normal auditory acuity. Nose:  No deformity, discharge,  or lesions. Neck:  Supple; no masses Lungs:  Poor inspiratory effort  Occasional wheezing throughout. [name]  Heart:  Regular rate, irreg rhythm Abdomen:  Soft, non-distended, nontender, BS active, no palp mass   Rectal:  Deferred  Msk: Right BKA Neurologic:  Alert,oriented to place. Confused at times.  Skin:  Intact without significant lesions or rashes.  Filed Weights   11/05/19 1126 11/07/19 0616  Weight: 77.1 kg 79.7 kg     Scheduled inpatient medications  sodium chloride   Intravenous Once   sodium chloride   Intravenous Once   amitriptyline  20 mg Oral QHS   aspirin EC  81 mg Oral Q breakfast   Chlorhexidine Gluconate Cloth  6 each Topical Daily   clopidogrel  75 mg Oral Daily   collagenase   Topical Daily   diltiazem  60 mg Oral Q6H   ferrous sulfate  325 mg Oral Q breakfast   heparin  5,000 Units Subcutaneous Q8H   insulin aspart  0-15 Units Subcutaneous TID WC   insulin aspart  0-5 Units Subcutaneous QHS   insulin glargine  20 Units Subcutaneous QHS   levothyroxine  100 mcg Oral Q breakfast   metoprolol  tartrate  25 mg Oral BID   multivitamin with minerals  1 tablet Oral Daily   pantoprazole  40 mg Oral Daily   potassium chloride  40 mEq Oral Q6H   pravastatin  10 mg Oral Q supper   primidone  150 mg Oral QHS   tobramycin  2 drop Both Eyes Q4H while awake      Intake/Output from previous day: 09/18 0701 - 09/19 0700 In: 3042 [P.O.:480; I.V.:2068; Blood:342; IV Piggyback:152] Out: 970 [Urine:970] Intake/Output this shift: No intake/output data recorded.   Lab Results: Recent Labs    11/06/19 0303 11/06/19 0303 11/06/19 2054 11/07/19 1001 11/08/19 0802  WBC 10.5  --   --  10.3 10.4  HGB 6.9*   < > 7.0* 8.3* 8.5*  HCT 22.8*   < > 23.5* 27.0* 27.8*  PLT 606*  --   --  681* 675*   < > = values in this interval not displayed.   BMET Recent Labs    11/06/19 0303 11/07/19 1001 11/08/19 0802  NA 132* 133* 133*  K 4.2 3.5 3.4*  CL 105 106 107  CO2 21* 17* 17*  GLUCOSE 152* 207* 125*  BUN _0 CREATININE 0.87 0.98 0.97  CALCIUM 7.6* 7.9* 7.9*   LFT Recent Labs    11/08/19 0802  PROT 6.0*  ALBUMIN 1.9*  AST 18  ALT 17  ALKPHOS 124  BILITOT 0.5   PT/INR No results for input(s): LABPROT, INR in the last 72 hours. Hepatitis Panel No results for input(s): HEPBSAG, HCVAB, HEPAIGM, HEPBIGM in the last 72 hours.   [name]  CBC Latest Ref Rng & Units 11/08/2019 11/07/2019 11/06/2019  WBC 4.0 - 10.5 K/uL 10.4 10.3 -  Hemoglobin 13.0 - 17.0 g/dL 8.5(L) 8.3(L) 7.0(L)  Hematocrit 39 - 52 % 27.8(L) 27.0(L) 23.5(L)  Platelets 150 - 400 K/uL 675(H) 681(H) -    . CMP Latest Ref Rng & Units 11/08/2019 11/07/2019 11/06/2019  Glucose 70 - 99 mg/dL 125(H) 207(H) 152(H)  BUN 8 - 23 mg/dL _0 Creatinine 0.61 - 1.24 mg/dL 0.97 0.98 0.87  Sodium 135 - 145 mmol/L 133(L) 133(L) 132(L)  Potassium 3.5 - 5.1 mmol/L 3.4(L) 3.5 4.2  Chloride 98 - 111 mmol/L 107 106 105  CO2 22 - 32 mmol/L 17(L) 17(L) 21(L)  Calcium 8.9 - 10.3 mg/dL 7.9(L) 7.9(L) 7.6(L)  Total Protein 6.5  - 8.1 g/dL 6.0(L) - 6.3(L)  Total Bilirubin 0.3 - 1.2 mg/dL 0.5 - 0.6  Alkaline Phos 38 - 126 U/L 124 - 131(H)  AST 15 - 41 U/L 18 - 28  ALT 0 - 44 U/L 17 - 29   Studies/Results: MR FOOT LEFT W WO CONTRAST  Result Date: 11/08/2019 CLINICAL DATA:  Osteomyelitis of the foot. Plain foot soft tissue wound EXAM: MRI OF THE LEFT FOREFOOT WITHOUT AND WITH CONTRAST TECHNIQUE: Multiplanar, multisequence MR imaging of the left foot was performed both before and after administration of intravenous contrast. CONTRAST:  60m GADAVIST GADOBUTROL 1 MMOL/ML IV SOLN COMPARISON:  None. FINDINGS: TENDONS Peroneal: Peroneal longus tendon intact. Peroneal brevis intact. Posteromedial: Posterior tibial tendon intact. Flexor hallucis longus tendon intact. Flexor digitorum longus tendon intact. Anterior: Tibialis anterior tendon intact. Extensor hallucis longus tendon intact Extensor digitorum longus tendon intact. Achilles:  Mild tendinosis of the distal Achilles tendon. Plantar Fascia: Thickening of the medial band of the plantar fascia at the calcaneal insertion as can be seen with mild plantar fasciitis. LIGAMENTS Lateral: Anterior talofibular ligament intact. Calcaneofibular ligament intact. Posterior talofibular ligament intact. Anterior and posterior tibiofibular ligaments intact. Medial: Deltoid ligament intact. Spring ligament intact. CARTILAGE Ankle Joint: No joint effusion. Normal ankle mortise. No chondral defect. Subtalar Joints/Sinus Tarsi: Normal subtalar joints. No subtalar joint effusion. Normal sinus tarsi. Bones/Soft Tissue: Soft tissue wound along the posterolateral aspect of the hindfoot overlying the posterior calcaneus. Underlying cortical destruction of the posterolateral calcaneus just inferior to the Achilles insertion with severe surrounding marrow edema most consistent with osteomyelitis. No drainable fluid collection. Soft tissue edema throughout the hindfoot extending into the ankle concerning for  cellulitis. T2 hyperintense signal throughout the plantar musculature likely neurogenic. IMPRESSION: 1. Soft tissue wound along the posterolateral aspect of the hindfoot overlying the posterior calcaneus. Underlying cortical destruction of the posterolateral calcaneus just inferior to the Achilles insertion with severe surrounding marrow edema most consistent with osteomyelitis with surrounding cellulitis. No drainable fluid collection. 2. Mild tendinosis of the distal Achilles tendon. 3. Thickening of the medial band of the plantar fascia at the calcaneal insertion as can be seen with mild plantar fasciitis. Electronically Signed   By: HKathreen Devoid  On: 11/08/2019 11:46    Active Problems:   HLD (hyperlipidemia)   Chronic anemia   Hypothyroidism   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   [place]is [place]    PTye Savoy NP-C @  11/08/2019, 2:00 PM

## 2019-11-08 NOTE — Progress Notes (Signed)
Pt's HR sustaining in 120s-130s, BP 127/82, no PRN order.  RN paged Cardiology on call.  Juliane Lack, MD stated to wait for Cardizem PO dose (given at 1808) a little more time to work, reassess in 2 hr. No new order at this time.

## 2019-11-09 ENCOUNTER — Inpatient Hospital Stay (HOSPITAL_COMMUNITY): Payer: Medicare Other | Admitting: Certified Registered"

## 2019-11-09 ENCOUNTER — Encounter (HOSPITAL_COMMUNITY): Payer: Self-pay | Admitting: Internal Medicine

## 2019-11-09 ENCOUNTER — Encounter (HOSPITAL_COMMUNITY)
Admission: EM | Disposition: A | Discharge status: Home Health | Payer: Self-pay | Source: Home / Self Care | Attending: Internal Medicine

## 2019-11-09 DIAGNOSIS — I4819 Other persistent atrial fibrillation: Secondary | ICD-10-CM

## 2019-11-09 DIAGNOSIS — M86172 Other acute osteomyelitis, left ankle and foot: Secondary | ICD-10-CM | POA: Insufficient documentation

## 2019-11-09 DIAGNOSIS — I4891 Unspecified atrial fibrillation: Secondary | ICD-10-CM

## 2019-11-09 DIAGNOSIS — D500 Iron deficiency anemia secondary to blood loss (chronic): Secondary | ICD-10-CM

## 2019-11-09 HISTORY — PX: ESOPHAGOGASTRODUODENOSCOPY (EGD) WITH PROPOFOL: SHX5813

## 2019-11-09 LAB — COMPREHENSIVE METABOLIC PANEL
ALT: 19 U/L (ref 0–44)
AST: 18 U/L (ref 15–41)
Albumin: 2.2 g/dL — ABNORMAL LOW (ref 3.5–5.0)
Alkaline Phosphatase: 131 U/L — ABNORMAL HIGH (ref 38–126)
Anion gap: 8 (ref 5–15)
BUN: 9 mg/dL (ref 8–23)
CO2: 18 mmol/L — ABNORMAL LOW (ref 22–32)
Calcium: 8.2 mg/dL — ABNORMAL LOW (ref 8.9–10.3)
Chloride: 112 mmol/L — ABNORMAL HIGH (ref 98–111)
Creatinine, Ser: 1.02 mg/dL (ref 0.61–1.24)
GFR calc Af Amer: >60 mL/min (ref >60)
GFR calc non Af Amer: >60 mL/min (ref >60)
Glucose, Bld: 94 mg/dL (ref 70–99)
Potassium: 4.3 mmol/L (ref 3.5–5.1)
Sodium: 138 mmol/L (ref 135–145)
Total Bilirubin: 0.3 mg/dL (ref 0.3–1.2)
Total Protein: 6.2 g/dL — ABNORMAL LOW (ref 6.5–8.1)

## 2019-11-09 LAB — GLUCOSE, CAPILLARY
Glucose-Capillary: 118 mg/dL — ABNORMAL HIGH (ref 70–99)
Glucose-Capillary: 136 mg/dL — ABNORMAL HIGH (ref 70–99)
Glucose-Capillary: 230 mg/dL — ABNORMAL HIGH (ref 70–99)
Glucose-Capillary: 61 mg/dL — ABNORMAL LOW (ref 70–99)
Glucose-Capillary: 74 mg/dL (ref 70–99)
Glucose-Capillary: 97 mg/dL (ref 70–99)
Glucose-Capillary: 99 mg/dL (ref 70–99)

## 2019-11-09 LAB — MAGNESIUM: Magnesium: 2 mg/dL (ref 1.7–2.4)

## 2019-11-09 SURGERY — ESOPHAGOGASTRODUODENOSCOPY (EGD) WITH PROPOFOL
Anesthesia: Monitor Anesthesia Care

## 2019-11-09 MED ORDER — PROPOFOL 10 MG/ML IV BOLUS
INTRAVENOUS | Status: DC | PRN
  Administered 2019-11-09: 25 mg via INTRAVENOUS

## 2019-11-09 MED ORDER — PROPOFOL 500 MG/50ML IV EMUL
INTRAVENOUS | Status: DC | PRN
  Administered 2019-11-09: 100 ug/kg/min via INTRAVENOUS

## 2019-11-09 MED ORDER — LACTATED RINGERS IV SOLN: INTRAVENOUS | Status: DC | PRN

## 2019-11-09 MED ORDER — INSULIN GLARGINE 100 UNIT/ML ~~LOC~~ SOLN
15.0000 [IU] | Freq: Every day | SUBCUTANEOUS | Status: DC
  Administered 2019-11-09 – 2019-11-11 (×3): 15 [IU] via SUBCUTANEOUS
  Filled 2019-11-09 (×4): qty 0.15

## 2019-11-09 MED ORDER — PHENYLEPHRINE 40 MCG/ML (10ML) SYRINGE FOR IV PUSH (FOR BLOOD PRESSURE SUPPORT)
PREFILLED_SYRINGE | INTRAVENOUS | Status: DC | PRN
  Administered 2019-11-09 (×3): 120 ug via INTRAVENOUS

## 2019-11-09 MED ORDER — DEXTROSE 50 % IV SOLN
25.0000 mL | Freq: Once | INTRAVENOUS | Status: AC
  Administered 2019-11-09: 25 mL via INTRAVENOUS

## 2019-11-09 SURGICAL SUPPLY — 15 items

## 2019-11-09 NOTE — Progress Notes (Signed)
Hypoglycemic Event  CBG: 61  Treatment: Dextrose 50% solution 25 ml  Symptoms:No Follow-up CBG: Time 8:12  CBG Result:99  Possible Reasons for Event: NPO  Comments/MD notified: Dr. Nicoletta Dress

## 2019-11-09 NOTE — Interval H&P Note (Signed)
History and Physical Interval Note:  11/09/2019 12:02 PM  Anthony Dunlap  has presented today for surgery, with the diagnosis of anemia, dark stools on iron.  The various methods of treatment have been discussed with the patient and family. After consideration of risks, benefits and other options for treatment, the patient has consented to  Procedure(s): ESOPHAGOGASTRODUODENOSCOPY (EGD) WITH PROPOFOL (N/A) as a surgical intervention.  The patient's history has been reviewed, patient examined, no change in status, stable for surgery.  I have reviewed the patient's chart and labs.  Questions were answered to the patient's satisfaction.     Anthony Dunlap

## 2019-11-09 NOTE — TOC Initial Note (Signed)
Transition of Care [facility]) - Initial/Assessment Note    Patient Details  Name: Anthony Dunlap MRN: [id number] Date of Birth: January 02, 1934  Transition of Care [facility]) CM/SW Contact:    Trula Ore, [place] Phone Number: 11/09/2019, 4:20 PM  Clinical Narrative:                  CSW received consult for possible SNF placement at time of discharge. CSW spoke with patients daughter [name] regarding PT recommendation of SNF placement at time of discharge.[name] says patient recently was at a SNF [facility] and was only home for a week before coming back into hospital. Patients daughter confirmed that patient lives at home with niece. Patients daughter confirmed that patient has 24/7 supervision and has home health through [facility].  Patients daughter expressed understanding of PT recommendation and wants patient to go home with home health services. Patient has received the COVID vaccines. CSW let patients daughter know that case manager will follow up with her about home health services.No further questions reported at this time. CSW to continue to follow and assist with discharge planning needs.  Expected Discharge Plan: [place] Barriers to Discharge: Continued Medical Work up   Patient Goals and CMS Choice Patient states their goals for this hospitalization and ongoing recovery are:: to go home with home health services CMS Medicare.gov Compare Post Acute Care list provided to:: Patient Represenative (must comment) [name]) Choice offered to / list presented to : Adult Children [name])  Expected Discharge Plan and Services Expected Discharge Plan: [place]       Living arrangements for the past 2 months: Single Family Home                                      Prior Living Arrangements/Services Living arrangements for the past 2 months: Single Family Home Lives with:: Self, Other (Comment) (niece) Patient language and need for  interpreter reviewed:: Yes Do you feel safe going back to the place where you live?: Yes      Need for Family Participation in Patient Care: Yes (Comment) Care giver support system in place?: Yes (comment)   Criminal Activity/Legal Involvement Pertinent to Current Situation/Hospitalization: No - Comment as needed  Activities of Daily Living Home Assistive Devices/Equipment: Bedside commode/3-in-1 ADL Screening (condition at time of admission) Patient's cognitive ability adequate to safely complete daily activities?: Yes Is the patient deaf or have difficulty hearing?: No Does the patient have difficulty seeing, even when wearing glasses/contacts?: No Does the patient have difficulty concentrating, remembering, or making decisions?: No Patient able to express need for assistance with ADLs?: Yes Does the patient have difficulty dressing or bathing?: Yes Independently performs ADLs?: No Does the patient have difficulty walking or climbing stairs?: Yes Weakness of Legs: Both Weakness of Arms/Hands: None  Permission Sought/Granted Permission sought to share information with : Case Manager, Family Supports, Customer service manager Permission granted to share information with : Yes, Verbal Permission Granted  Share Information with NAME: [name]  Permission granted to share info w AGENCY: [place]  Permission granted to share info w Relationship: Daughter  Permission granted to share info w Contact Information: [name] [phone]  Emotional Assessment Appearance:: Appears stated age Attitude/Demeanor/Rapport: Gracious Affect (typically observed): Calm Orientation: : Oriented to Self Alcohol / Substance Use: Not Applicable Psych Involvement: No (comment)  Admission diagnosis:  Atrial fibrillation  with RVR ([place]) [I48.91] Sepsis ([place]) [A41.9] Osteomyelitis, unspecified site, unspecified type ([place]) [M86.9] Sepsis, due to unspecified organism, unspecified whether acute organ dysfunction  present [facility]) [A41.9] Patient Active Problem List   Diagnosis Date Noted  . Atrial fibrillation with RVR ([place])   . Osteomyelitis ([place])   . Sepsis ([place]) 11/05/2019  . Chronic heel ulcer, left, limited to breakdown of skin ([place]) 10/28/2019  . Acute osteomyelitis of calcaneum, right ([place]) 09/25/2019  . Critical lower limb ischemia 08/11/2019  . Hx of total hip arthroplasty, right 08/06/2019  . Arthritis of right hip 06/12/2019  . Pelvic abscess in male [facility]) 09/11/2018  . Drug-induced hepatitis 06/05/2018  . Calculus of gallbladder without cholecystitis without obstruction   . Fungemia/Candida albicans 05/10/2018  . UTI (urinary tract infection) 05/08/2018  . Complicated UTI (urinary tract infection) 05/08/2018  . 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 Abscess of left thigh   . Pressure injury of skin 02/10/2018  . Enterococcus faecalis infection 01/27/2018  . Abscess   . Psoas abscess ([place]) 01/23/2018  . Sepsis due to Bacteroides species ([place]) 01/22/2018  . Sepsis without acute organ dysfunction ([place])   . DKA (diabetic ketoacidoses) ([place]) 01/17/2018  . Normocytic anemia 01/17/2018  . Chronic anemia 01/03/2018  . Chronic indwelling Foley catheter 01/03/2018  . Elevated alkaline phosphatase level 01/03/2018  . Hypothyroidism 01/03/2018  . Physical deconditioning 01/03/2018  . 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 HLD (hyperlipidemia) 08/19/2013  . Type 2 diabetes mellitus ([place])   . Prostate cancer [facility])    PCP:  [name], MD Pharmacy:   [facility] [address] AT [place]. [place] [address] Phone: [phone] Fax: [phone]     Social Determinants of Health (SDOH) Interventions    Readmission Risk Interventions Readmission Risk Prevention Plan 09/29/2019 05/09/2018  Transportation Screening Complete Complete  PCP or Specialist  Appt within 5-7 Days Not Complete -  Not Complete comments plan for SNF -  PCP or Specialist Appt within 3-5 Days - Complete  Home Care Screening Complete -  Medication Review (RN CM) Referral to [place] or [facility] - Complete  Social Work Consult for [place] Planning/Counseling - Complete  Palliative Care Screening - Not Applicable  Medication Review Press photographer) - Complete  Some recent data might be hidden

## 2019-11-09 NOTE — Progress Notes (Signed)
Inpatient Diabetes Program Recommendations  AACE/ADA: New Consensus Statement on Inpatient Glycemic Control (2015)  Target Ranges:  Prepandial:   less than 140 mg/dL      Peak postprandial:   less than 180 mg/dL (1-2 hours)      Critically ill patients:  140 - 180 mg/dL   Lab Results  Component Value Date   GLUCAP 74 11/09/2019   HGBA1C 8.5 (H) 11/05/2019    Review of Glycemic Control Results for Anthony Dunlap, Anthony Dunlap (MRN [id number]) as of 11/09/2019 14:11  Ref. Range 11/08/2019 21:15 11/09/2019 03:13 11/09/2019 07:33 11/09/2019 08:13 11/09/2019 13:47  Glucose-Capillary Latest Ref Range: 70 - 99 mg/dL 270 (H) 97 61 (L) 99 74   Diabetes history: DM 2 Outpatient Diabetes medications: Amaryl 4 mg daily, Novolog 0-16 units tid with meals, Soliqua 40 units after breakfast Current orders for Inpatient glycemic control:  Novolog moderate tid with meals and HS Lantus 20 units daily  Inpatient Diabetes Program Recommendations:    Please consider reducing Lantus to 15 units daily.  Once eating again, may need low dose meal coverage to prevent post-prandial increases in blood sugars. Consider Novolog 2 units tid with meals (Hold if patient NPO or eats less than 50%).    Thanks,  Adah Perl, RN, BC-ADM Inpatient Diabetes Coordinator Pager [phone] (8a-5p)

## 2019-11-09 NOTE — Progress Notes (Signed)
Patient ID: THEOPHILUS WALZ, male   DOB: 01-16-1934, 86 y.o.   MRN: [id number]          [facility]    Date of Admission:  11/05/2019   Total days of antibiotics 5        Day 3 daptomycin and ampicillin sulbactam  Mr. Hiney is currently in the endoscopy suite.  He has a remote history of pelvic osteomyelitis.  He underwent right BKA last month for calcaneal osteomyelitis.  He has a chronic left heel ulcer now known to be complicated by osteomyelitis.  CT scan on this admission also revealed a complex/exudative pericardial effusion.  Blood cultures remain negative.  I will continue current antibiotics for now.         Michel Bickers, MD [facility] [place] [facility] [phone] pager   [phone] cell 11/09/2019, 1:33 PM

## 2019-11-09 NOTE — Progress Notes (Signed)
PROGRESS NOTE    Anthony Dunlap  MPN:[id number]  DOB: [birth year]  DOA: 11/05/2019 PCP: [name], MD Outpatient Specialists:   Hospital course:  86 year old with DM2, prostate cancer, severe PVD followed by [name] status post right BKA and history of hip osteomyelitis on chronic suppressive antibiotic therapy was admitted yesterday for fever and new onset A. fib with RVR. On admission patient was hemodynamically stable but his hemoglobin was noted to be markedly decreased from last month. History per patient's daughter who is his caregiver noted that he had been having black stool for a week prior to that. Patient was not started on anticoagulation anticoagulation for his atrial fibrillation. Patient was transfused 1 unit PRBC. Patient was seen by cardiology after being placed on a Cardizem drip. Cardizem was discontinued and patient was placed on Lopressor with reasonable control of his heart rate. Patient was seen by [name] who also follows him as an outpatient. He underwent an MRI which revealed osteomyelitis of his left heel. Skin surgery was called to consult regarding blood flow. Patient underwent EGD on 11/09/2019 which was unrevealing.  Subjective:  Patient without any new complaints.  Is relieved his endoscopies over  Objective: Vitals:   11/09/19 1247 11/09/19 1253 11/09/19 1303 11/09/19 1336  BP: 96/62 101/68 117/66 120/75  Pulse: 86 78 86 71  Resp: (!) 25 (!) 25 (!) 25 (!) 22  Temp: 98.1 F (36.7 C)  97.9 F (36.6 C) 97.9 F (36.6 C)  TempSrc: Temporal  Oral Oral  SpO2: 94% 98% 97% 95%  Weight:      Height:        Intake/Output Summary (Last 24 hours) at 11/09/2019 1441 Last data filed at 11/08/2019 2344 [place] per 24 hour  Intake 240 ml  Output 1300 ml  Net -1060 ml   Filed Weights   11/07/19 0616 11/09/19 0405 11/09/19 1119  Weight: 79.7 kg 81.2 kg 81.2 kg     Exam:  General: Chronically ill-appearing man lying in bed in no acute distress.   Eyes: sclera anicteric, conjuctiva mild injection bilaterally CVS: S1-S2, regular  Respiratory:  decreased air entry bilaterally secondary to decreased inspiratory effort, rales at bases  GI: NABS, soft, NT  LE: He has an ulcer on left heel with fibrinous exudate that does not look superficially infected.  BKA on right. Neuro: grossly nonfocal.    Assessment & Plan:   86 year old man with extensive PVD and chronic osteomyelitis of his pelvis was admitted with new onset A. fib with RVR and sepsis. Work-up reveals osteomyelitis of left foot.  Sepsis likely secondary to osteomyelitis of left foot MRI done confirms osteomyelitis of posterior lateral calcaneus and surrounding cellulitis Vascular surgery will come evaluate the patient Continue daptomycin and Unasyn day #3 per [name]. ID is following  Atrial fibrillation with RVR Appreciate cardiology input Heart rate under better control with addition of Lopressor 25 twice daily Chads vasc2 is 4, hold anticoagulation until GI seen patient given reported history of melena last week  Anemia with reported melena Patient was transfused 1 unit PRBC on admission  H&H have increased appropriately and are stable Patient underwent endoscopy today which was unrevealing, patient okay to start anticoagulation for atrial fibrillation when warranted. Will hold for now pending further vascular evaluation. Patient is on Protonix 40 mg daily  Hypokalemia Normalized with repletion  Hypomagnesemia Normalized with repletion  Chronic OM of pelvis and pubic symphysis Antibiotics consolidated to daptomycin and Unasyn per [name]  Status  post BKA last month for calcaneal osteomyelitis PT as tolerated  Pericardial effusion Does not appear exudative per [name] Cardiology notes it is not impressive and does not need further follow-up  DM2 Blood sugar is coming down with improved control of infection  Continue present inpatient  management  Anxiety and Depression Restart Xanax 0.5 p.o. twice daily as needed per daughter's request    DVT prophylaxis: Subcu heparin Code Status: Full Family Communication: None present Disposition Plan:   Patient is from: Home  Anticipated Discharge Location: SNF  Barriers to Discharge: Sepsis  Is patient medically stable for Discharge: No   Consultants:  Cardiology  ID  Vascular Surgery  Procedures:  EGD 11/09/19  Antimicrobials:  Daptomycin  Unasyn   Data Reviewed:  Basic Metabolic Panel: Recent Labs  Lab 11/05/19 1203 11/06/19 0303 11/07/19 1001 11/08/19 0802 11/09/19 0306  NA 127* 132* 133* 133* 138  K 3.2* 4.2 3.5 3.4* 4.3  CL 95* 105 106 107 112*  CO2 19* 21* 17* 17* 18*  GLUCOSE 139* 152* 207* 125* 94  BUN 23 18 15 11 9   CREATININE 1.04 0.87 0.98 0.97 1.02  CALCIUM 8.2* 7.6* 7.9* 7.9* 8.2*  MG  --  1.7  --  1.6* 2.0  PHOS  --  2.6  --   --   --    Liver Function Tests: Recent Labs  Lab 11/05/19 1203 11/06/19 0303 11/08/19 0802 11/09/19 0306  AST 43* 28 18 18   ALT 33 29 17 19   ALKPHOS 150* 131* 124 131*  BILITOT 0.7 0.6 0.5 0.3  PROT 7.0 6.3* 6.0* 6.2*  ALBUMIN 2.6* 2.2* 1.9* 2.2*   No results for input(s): LIPASE, AMYLASE in the last 168 hours. No results for input(s): AMMONIA in the last 168 hours. CBC: Recent Labs  Lab 11/05/19 1203 11/06/19 0303 11/06/19 2054 11/07/19 1001 11/08/19 0802  WBC 11.0* 10.5  --  10.3 10.4  NEUTROABS 8.6*  --   --  7.9* 6.6  HGB 7.4* 6.9* 7.0* 8.3* 8.5*  HCT 24.4* 22.8* 23.5* 27.0* 27.8*  MCV 82.2 83.2  --  83.9 83.2  PLT 622* 606*  --  681* 675*   Cardiac Enzymes: Recent Labs  Lab 11/07/19 1001  CKTOTAL 68   BNP (last 3 results) No results for input(s): PROBNP in the last 8760 hours. CBG: Recent Labs  Lab 11/08/19 2115 11/09/19 0313 11/09/19 0733 11/09/19 0813 11/09/19 1347  GLUCAP 270* 97 61* 99 74    Recent Results (from the past 240 hour(s))  Urine culture      Status: Abnormal   Collection Time: 11/05/19 11:55 AM   Specimen: In/Out Cath Urine  Result Value Ref Range Status   Specimen Description   Final    IN/OUT CATH URINE Performed at [facility], [address] [place], [place] [address]    Special Requests   Final    NONE Performed at [facility], [address], [place], [place] [address]    Culture MULTIPLE SPECIES PRESENT, SUGGEST RECOLLECTION (A)  Final   Report Status 11/06/2019 FINAL  Final  Blood Culture (routine x 2)     Status: None (Preliminary result)   Collection Time: 11/05/19 12:03 PM   Specimen: BLOOD  Result Value Ref Range Status   Specimen Description BLOOD  Final   Special Requests NONE  Final   Culture   Final    NO GROWTH 4 DAYS Performed at [facility], [address], [place], [place] [address]  Report Status PENDING  Incomplete  Blood Culture (routine x 2)     Status: None (Preliminary result)   Collection Time: 11/05/19 12:12 PM   Specimen: BLOOD  Result Value Ref Range Status   Specimen Description BLOOD  Final   Special Requests NONE  Final   Culture   Final    NO GROWTH 4 DAYS Performed at [facility], [address], [place], [place] [address]    Report Status PENDING  Incomplete  SARS Coronavirus 2 by RT PCR (hospital order, performed in [place] hospital lab) Nasopharyngeal Nasopharyngeal Swab     Status: None   Collection Time: 11/05/19 12:17 PM   Specimen: Nasopharyngeal Swab  Result Value Ref Range Status   SARS Coronavirus 2 NEGATIVE NEGATIVE Final    Comment: (NOTE) SARS-CoV-2 target nucleic acids are NOT DETECTED.  The SARS-CoV-2 RNA is generally detectable in upper and lower respiratory specimens during the acute phase of infection. The lowest concentration of SARS-CoV-2 viral copies this assay can detect is 250 copies / mL. A negative result does not preclude SARS-CoV-2 infection and should not be used as the sole basis for treatment or other patient management decisions.  A  negative result may occur with improper specimen collection / handling, submission of specimen other than nasopharyngeal swab, presence of viral mutation(s) within the areas targeted by this assay, and inadequate number of viral copies (<250 copies / mL). A negative result must be combined with clinical observations, patient history, and epidemiological information.  Fact Sheet for Patients:   StrictlyIdeas.no  Fact Sheet for Healthcare Providers: BankingDealers.co.za  This test is not yet approved or  cleared by the [place] FDA and has been authorized for detection and/or diagnosis of SARS-CoV-2 by FDA under an Emergency Use Authorization (EUA).  This EUA will remain in effect (meaning this test can be used) for the duration of the COVID-19 declaration under Section 564(b)(1) of the Act, 21 U.S.C. section 360bbb-3(b)(1), unless the authorization is terminated or revoked sooner.  Performed at [facility], [address] [place], [place] [address]       Studies: DG Abd 1 View  Result Date: 11/08/2019 CLINICAL DATA:  Follow-up for abdominal distension EXAM: ABDOMEN - 1 VIEW COMPARISON:  None. FINDINGS: Gaseous distension of the small bowel and colon. Moderate amount of stool throughout the colon. No evidence of pneumoperitoneum, portal venous gas or pneumatosis. No pathologic calcifications along the expected course of the ureters. No acute osseous abnormality. IMPRESSION: Gaseous distension of the small bowel and colon as can be seen with an ileus. Electronically Signed   By: Kathreen Devoid   On: 11/08/2019 15:47   MR FOOT LEFT W WO CONTRAST  Result Date: 11/08/2019 CLINICAL DATA:  Osteomyelitis of the foot. Plain foot soft tissue wound EXAM: MRI OF THE LEFT FOREFOOT WITHOUT AND WITH CONTRAST TECHNIQUE: Multiplanar, multisequence MR imaging of the left foot was performed both before and after administration of intravenous contrast.  CONTRAST:  70mL GADAVIST GADOBUTROL 1 MMOL/ML IV SOLN COMPARISON:  None. FINDINGS: TENDONS Peroneal: Peroneal longus tendon intact. Peroneal brevis intact. Posteromedial: Posterior tibial tendon intact. Flexor hallucis longus tendon intact. Flexor digitorum longus tendon intact. Anterior: Tibialis anterior tendon intact. Extensor hallucis longus tendon intact Extensor digitorum longus tendon intact. Achilles:  Mild tendinosis of the distal Achilles tendon. Plantar Fascia: Thickening of the medial band of the plantar fascia at the calcaneal insertion as can be seen with mild plantar fasciitis. LIGAMENTS Lateral: Anterior talofibular ligament intact. Calcaneofibular ligament  intact. Posterior talofibular ligament intact. Anterior and posterior tibiofibular ligaments intact. Medial: Deltoid ligament intact. Spring ligament intact. CARTILAGE Ankle Joint: No joint effusion. Normal ankle mortise. No chondral defect. Subtalar Joints/Sinus Tarsi: Normal subtalar joints. No subtalar joint effusion. Normal sinus tarsi. Bones/Soft Tissue: Soft tissue wound along the posterolateral aspect of the hindfoot overlying the posterior calcaneus. Underlying cortical destruction of the posterolateral calcaneus just inferior to the Achilles insertion with severe surrounding marrow edema most consistent with osteomyelitis. No drainable fluid collection. Soft tissue edema throughout the hindfoot extending into the ankle concerning for cellulitis. T2 hyperintense signal throughout the plantar musculature likely neurogenic. IMPRESSION: 1. Soft tissue wound along the posterolateral aspect of the hindfoot overlying the posterior calcaneus. Underlying cortical destruction of the posterolateral calcaneus just inferior to the Achilles insertion with severe surrounding marrow edema most consistent with osteomyelitis with surrounding cellulitis. No drainable fluid collection. 2. Mild tendinosis of the distal Achilles tendon. 3. Thickening of the  medial band of the plantar fascia at the calcaneal insertion as can be seen with mild plantar fasciitis. Electronically Signed   By: Kathreen Devoid   On: 11/08/2019 11:46     Scheduled Meds: . sodium chloride   Intravenous Once  . sodium chloride   Intravenous Once  . amitriptyline  20 mg Oral QHS  . aspirin EC  81 mg Oral Q breakfast  . Chlorhexidine Gluconate Cloth  6 each Topical Daily  . collagenase   Topical Daily  . diltiazem  60 mg Oral Q6H  . ferrous sulfate  325 mg Oral Q breakfast  . insulin aspart  0-15 Units Subcutaneous TID WC  . insulin aspart  0-5 Units Subcutaneous QHS  . insulin glargine  15 Units Subcutaneous QHS  . levothyroxine  100 mcg Oral Q breakfast  . metoprolol tartrate  25 mg Oral BID  . multivitamin with minerals  1 tablet Oral Daily  . pantoprazole (PROTONIX) IV  40 mg Intravenous QHS  . polyethylene glycol  17 g Oral Daily  . pravastatin  10 mg Oral Q supper  . primidone  150 mg Oral QHS  . tobramycin  2 drop Both Eyes Q4H while awake   Continuous Infusions: . sodium chloride Stopped (11/09/19 1310)  . ampicillin-sulbactam (UNASYN) IV 3 g (11/09/19 0525)  . 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

## 2019-11-09 NOTE — Op Note (Addendum)
[facility] Patient Name: Anthony Dunlap Procedure Date : 11/09/2019 MRN: [id number] Attending MD: Gerrit Heck , MD Date of Birth: August 18, 1933 CSN: [id number] Age: 86 Admit Type: Inpatient Procedure:                Upper GI endoscopy Indications:              Chronic anemia, rule out high risk gastric lesion,                            dark stools                           86 yo male with histoyr of PVD on ASA/Plavix,                            admitted with sepsis/osteomyelitis and AFib with                            RVR (new onset). Plan for EGD to rule out high risk                            upper GI lesion prior to starting anticoagulation. Providers:                Gerrit Heck, DO, Glori Bickers, RN, Ladona Ridgel, Technician Referring MD:              Medicines:                Monitored Anesthesia Care Complications:            No immediate complications. Estimated Blood Loss:     Estimated blood loss: none. Procedure:                Pre-Anesthesia Assessment:                           - Prior to the procedure, a History and Physical                            was performed, and patient medications and                            allergies were reviewed. The patient's tolerance of                            previous anesthesia was also reviewed. The risks                            and benefits of the procedure and the sedation                            options and risks were discussed with the patient.  All questions were answered, and informed consent                            was obtained. Prior Anticoagulants: The patient has                            taken Plavix (clopidogrel), last dose was 1 day                            prior to procedure. ASA Grade Assessment: III - A                            patient with severe systemic disease. After                            reviewing the risks and  benefits, the patient was                            deemed in satisfactory condition to undergo the                            procedure.                           After obtaining informed consent, the endoscope was                            passed under direct vision. Throughout the                            procedure, the patient's blood pressure, pulse, and                            oxygen saturations were monitored continuously. The                            GIF-H190 ([id number]) Olympus gastroscope was                            introduced through the mouth, and advanced to the                            second part of duodenum. The upper GI endoscopy was                            accomplished without difficulty. The patient                            tolerated the procedure well. Scope In: Scope Out: Findings:      The examined esophagus was normal.      The entire examined stomach was normal.      The duodenal bulb, first portion of the duodenum and second portion of       the duodenum were normal. Impression:               -  Normal esophagus.                           - Normal stomach.                           - Normal duodenal bulb, first portion of the                            duodenum and second portion of the duodenum.                           - No specimens collected. Recommendation:           - Return patient to hospital ward for ongoing care.                           - Full liquid diet now and advance as tolerated.                           - Continue present medications.                           - Return to GI clinic PRN.                           - Please do no hesitate to contact the GI service                            with additional questions or concerns. Procedure Code(s):        --- Professional ---                           [phone], Esophagogastroduodenoscopy, flexible,                            transoral; diagnostic, including collection of                             specimen(s) by brushing or washing, when performed                            (separate procedure) Diagnosis Code(s):        --- Professional ---                           D50.0, Iron deficiency anemia secondary to blood                            loss (chronic) CPT copyright 2019 American Medical Association. All rights reserved. The codes documented in this report are preliminary and upon coder review may  be revised to meet current compliance requirements. Gerrit Heck, MD 11/09/2019 12:59:01 PM Number of Addenda: 0

## 2019-11-09 NOTE — Transfer of Care (Signed)
Immediate Anesthesia Transfer of Care Note  Patient: Anthony Dunlap  Procedure(s) Performed: ESOPHAGOGASTRODUODENOSCOPY (EGD) WITH PROPOFOL (N/A )  Patient Location: Endoscopy Unit  Anesthesia Type:MAC  Level of Consciousness: drowsy  Airway & Oxygen Therapy: Patient Spontanous Breathing and Patient connected to nasal cannula oxygen  Post-op Assessment: Report given to RN and Post -op Vital signs reviewed and stable  Post vital signs: Reviewed and stable  Last Vitals:  Vitals Value Taken Time  BP 87/51 11/09/19 1244  Temp    Pulse 88 11/09/19 1245  Resp 23 11/09/19 1245  SpO2 95 % 11/09/19 1245  Vitals shown include unvalidated device data.  Last Pain:  Vitals:   11/09/19 1119  TempSrc: Temporal  PainSc: 0-No pain         Complications: No complications documented.

## 2019-11-09 NOTE — Consult Note (Signed)
Referring Physician: [facility]  Patient name: ELIZABETH HAFF MRN: [id number] DOB: 12/08/1933 Sex: male  REASON FOR CONSULT: osteomyelitis left heel  HPI: Anthony Dunlap is a 86 y.o. male, known to our service.  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  He had a brief stay in rehab but has been at home and has not really made any progress towards a prosthetic on the right leg.  He has a chronic right heel ulcer and had PTA of the left AT by Dr [name] 6/30 to improve healing potential.  He was admitted last week with sepsis and the right heel is thought to be the source.  Recent MRI shows osteomyelitis of the calcaneus. Pt states he has occasional pain from the heel but not much.  He has home health aides that help take care of him.  He understands that he is probably facing another amputation and has been told minimal chance of wound healing of left heel.  He is currently on Daptomycin and Unasyn for antibiotic coverage.  He has also had new onset of afib this admission and is being evaluated by Cardiology  Past Medical History:  Diagnosis Date  . Chronic heel ulcer, left, limited to breakdown of skin ([place]) 10/28/2019  . 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 Diabetes ([place])   . Drug-induced hepatitis 06/05/2018  . Inguinal hernia    06/09/2019: per patient " a long time ago on the right side"  . Left eye pain   . Localized osteoarthrosis of right hip 01/05/2019  . Prostate cancer [facility])    Past Surgical History:  Procedure Laterality Date  . ABDOMINAL AORTOGRAM W/LOWER EXTREMITY Bilateral 08/12/2019   Procedure: ABDOMINAL AORTOGRAM W/LOWER EXTREMITY;  Surgeon: Marty Heck, MD;  Location: [place] [facility];  Service: Cardiovascular;  Laterality: Bilateral;  . ABDOMINAL AORTOGRAM W/LOWER EXTREMITY Left 08/19/2019   Procedure: ABDOMINAL AORTOGRAM W/LOWER EXTREMITY;  Surgeon: Marty Heck, MD;  Location: [place] [facility];  Service:  Cardiovascular;  Laterality: Left;  . AMPUTATION Right 09/28/2019   Procedure: AMPUTATION BELOW KNEE;  Surgeon: Rosetta Posner, MD;  Location: [place];  Service: Vascular;  Laterality: Right;  . HERNIA REPAIR    . IR RADIOLOGIST EVAL & MGMT  03/06/2018  . PERIPHERAL VASCULAR BALLOON ANGIOPLASTY Left 08/19/2019   Procedure: PERIPHERAL VASCULAR BALLOON ANGIOPLASTY;  Surgeon: Marty Heck, MD;  Location: [place] [facility];  Service: Cardiovascular;  Laterality: Left;  . PROSTATE SURGERY    . TOTAL HIP ARTHROPLASTY Right 06/12/2019   Procedure: RIGHT TOTAL HIP ARTHROPLASTY DIRECT ANTERIOR;  Surgeon: Marybelle Killings, MD;  Location: [place];  Service: Orthopedics;  Laterality: Right;    Family History  Problem Relation Age of Onset  . Pneumonia Father   . Cancer Sister     SOCIAL HISTORY: Social History   Socioeconomic History  . Marital status: Widowed    Spouse name: [name]  . Number of children: 3  . Years of education: 9th  . Highest education level: Not on file  Occupational History    Employer: RETIRED    Comment: Retired  Tobacco Use  . Smoking status: Never Smoker  . Smokeless tobacco: Never Used  Vaping Use  . Vaping Use: Never used  Substance and Sexual Activity  . Alcohol use: No  . Drug use: No  . Sexual activity: Not on file  Other Topics Concern  . Not on file  Social History  Narrative   Patient lives at home with his wife. [name]) . Patient is retired.   Education 9th grade.   Right handed.   Caffeine None   Social Determinants of Health   Financial Resource Strain:   . Difficulty of Paying Living Expenses: Not on file  Food Insecurity:   . Worried About Charity fundraiser in the Last Year: Not on file  . Ran Out of Food in the Last Year: Not on file  Transportation Needs:   . Lack of Transportation (Medical): Not on file  . Lack of Transportation (Non-Medical): Not on file  Physical Activity:   . Days of Exercise per Week: Not on file  . Minutes  of Exercise per Session: Not on file  Stress:   . Feeling of Stress : Not on file  Social Connections:   . Frequency of Communication with Friends and Family: Not on file  . Frequency of Social Gatherings with Friends and Family: Not on file  . Attends Religious Services: Not on file  . Active Member of Clubs or Organizations: Not on file  . Attends Archivist Meetings: Not on file  . Marital Status: Not on file  Intimate Partner Violence:   . Fear of Current or Ex-Partner: Not on file  . Emotionally Abused: Not on file  . Physically Abused: Not on file  . Sexually Abused: Not on file    Allergies  Allergen Reactions  . Fluconazole Other (See Comments)    Drug-induced hepatitis    Current Facility-Administered Medications  Medication Dose Route Frequency Provider Last Rate Last Admin  . 0.9 %  sodium chloride infusion (Manually program via Guardrails IV Fluids)   Intravenous Once Cirigliano, Vito V, DO      . 0.9 %  sodium chloride infusion (Manually program via Guardrails IV Fluids)   Intravenous Once Cirigliano, Vito V, DO      . 0.9 %  sodium chloride infusion   Intravenous Continuous Cirigliano, Vito V, DO   Stopped at 11/09/19 1310  . acetaminophen (TYLENOL) tablet 650 mg  650 mg Oral Q6H PRN Cirigliano, Vito V, DO   650 mg at 11/06/19 0050   Or  . acetaminophen (TYLENOL) suppository 650 mg  650 mg Rectal Q6H PRN Cirigliano, Vito V, DO      . ALPRAZolam (XANAX) tablet 0.5 mg  0.5 mg Oral BID PRN Cirigliano, Vito V, DO      . amitriptyline (ELAVIL) tablet 20 mg  20 mg Oral QHS Cirigliano, Vito V, DO   20 mg at 11/08/19 2103  . Ampicillin-Sulbactam (UNASYN) 3 g in sodium chloride 0.9 % 100 mL IVPB  3 g Intravenous Q6H Cirigliano, Vito V, DO 200 mL/hr at 11/09/19 1712 3 g at 11/09/19 1712  . aspirin EC tablet 81 mg  81 mg Oral Q breakfast Cirigliano, Vito V, DO   81 mg at 11/09/19 1400  . Chlorhexidine Gluconate Cloth 2 % PADS 6 each  6 each Topical Daily Cirigliano,  Vito V, DO   6 each at 11/09/19 0800  . colchicine tablet 0.6 mg  0.6 mg Oral Daily PRN Cirigliano, Vito V, DO      . collagenase (SANTYL) ointment   Topical Daily Cirigliano, Vito V, DO   Given at 11/09/19 1354  . DAPTOmycin (CUBICIN) 700 mg in sodium chloride 0.9 % IVPB  700 mg Intravenous Q2000 Cirigliano, Vito V, DO   Stopped at 11/08/19 2050  . diltiazem (CARDIZEM) tablet 60 mg  60 mg Oral Q6H Cirigliano, Vito V, DO   60 mg at 11/09/19 1713  . ferrous sulfate tablet 325 mg  325 mg Oral Q breakfast Cirigliano, Vito V, DO   325 mg at 11/09/19 1355  . HYDROcodone-acetaminophen (NORCO/VICODIN) 5-325 MG per tablet 1-2 tablet  1-2 tablet Oral Q4H PRN Cirigliano, Vito V, DO      . insulin aspart (novoLOG) injection 0-15 Units  0-15 Units Subcutaneous TID WC Cirigliano, Vito V, DO   5 Units at 11/08/19 1808  . insulin aspart (novoLOG) injection 0-5 Units  0-5 Units Subcutaneous QHS Cirigliano, Vito V, DO   3 Units at 11/08/19 2125  . insulin glargine (LANTUS) injection 15 Units  15 Units Subcutaneous QHS Bonnell Public Tublu, MD      . levothyroxine (SYNTHROID) tablet 100 mcg  100 mcg Oral Q breakfast Cirigliano, Vito V, DO   100 mcg at 11/09/19 0824  . liver oil-zinc oxide (DESITIN) 40 % ointment   Topical PRN Cirigliano, Vito V, DO      . metoprolol tartrate (LOPRESSOR) tablet 25 mg  25 mg Oral BID Cirigliano, Vito V, DO   25 mg at 11/09/19 0824  . multivitamin with minerals tablet 1 tablet  1 tablet Oral Daily Cirigliano, Vito V, DO   1 tablet at 11/09/19 1354  . pantoprazole (PROTONIX) injection 40 mg  40 mg Intravenous QHS Cirigliano, Vito V, DO   40 mg at 11/08/19 2104  . polyethylene glycol (MIRALAX / GLYCOLAX) packet 17 g  17 g Oral Daily Cirigliano, Vito V, DO   17 g at 11/08/19 1808  . pravastatin (PRAVACHOL) tablet 10 mg  10 mg Oral Q supper Cirigliano, Vito V, DO   10 mg at 11/09/19 1713  . primidone (MYSOLINE) tablet 150 mg  150 mg Oral QHS Cirigliano, Vito V, DO   150 mg at 11/08/19  2125  . tobramycin (TOBREX) 0.3 % ophthalmic solution 2 drop  2 drop Both Eyes Q4H while awake Cirigliano, Vito V, DO   2 drop at 11/09/19 1354    ROS:   Neurologic: No dizziness, blackouts, seizures. No recent symptoms of stroke or mini- stroke. No recent episodes of slurred speech, or temporary blindness.  Cardiac: No recent episodes of chest pain/pressure, no shortness of breath at rest.  No shortness of breath with exertion.  Denies history of atrial fibrillation or irregular heartbeat    Physical Examination  Vitals:   11/09/19 1253 11/09/19 1303 11/09/19 1336 11/09/19 1620  BP: 101/68 117/66 120/75 109/74  Pulse: 78 86 71 90  Resp: (!) 25 (!) 25 (!) 22 19  Temp:  97.9 F (36.6 C) 97.9 F (36.6 C) 98.4 F (36.9 C)  TempSrc:  Oral Oral Oral  SpO2: 98% 97% 95%   Weight:      Height:        Body mass index is 27.22 kg/m.  General:  Alert and oriented, no acute distress HEENT: Normal Skin: No rash, 5 x 5 cm full thickness ulcer with 80% granulation left heel central 20% with fibrinous exudate.  This is very similar to appearance of wound when seen in our office 2 weeks ago Extremity Pulses: 1+ left DP pulse foot pink warm Musculoskeletal: slowly healing right BKA  Neurologic: Upper and lower extremity motor 5/5 and symmetric  DATA:  MRI reviewed calcaneus osteo  CBC    Component Value Date/Time   WBC 10.4 11/08/2019 0802   RBC 3.34 (L) 11/08/2019 0802   HGB 8.5 (  L) 11/08/2019 0802   HCT 27.8 (L) 11/08/2019 0802   PLT 675 (H) 11/08/2019 0802   MCV 83.2 11/08/2019 0802   MCH 25.4 (L) 11/08/2019 0802   MCHC 30.6 11/08/2019 0802   RDW 15.3 11/08/2019 0802   LYMPHSABS 2.0 11/08/2019 0802   MONOABS 0.9 11/08/2019 0802   EOSABS 0.7 (H) 11/08/2019 0802   BASOSABS 0.1 11/08/2019 0802    BMET    Component Value Date/Time   NA 138 11/09/2019 0306   K 4.3 11/09/2019 0306   CL 112 (H) 11/09/2019 0306   CO2 18 (L) 11/09/2019 0306   GLUCOSE 94 11/09/2019 0306    BUN 9 11/09/2019 0306   CREATININE 1.02 11/09/2019 0306   CREATININE 1.07 10/28/2019 1205   CALCIUM 8.2 (L) 11/09/2019 0306   GFRNONAA >60 11/09/2019 0306   GFRNONAA 63 10/28/2019 1205   GFRAA >60 11/09/2019 0306   GFRAA 73 10/28/2019 1205     ASSESSMENT:  Left heel ulcer chronic now with osteo although difficult to know if this is acute worsening as the wound has been present at least since April of this year.  Agree that potential for wound healing is very low.   PLAN:  Options include continued lifelong suppression with antibiotics versus left BKA.  I d/w pt and he agrees that wound will probably not heal but he wants to discuss options with his daughter.  If he proceeds with BKA he understands this will eliminate the wound but potential for ambulation with prosthetic is essentially zero given his age.    I will let Dr [name] know of pt admission and he can discuss further tomorrow.  Would hold on any oral anticoagulation until decision is made.  Ruta Hinds, MD [facility] Office: [phone]

## 2019-11-09 NOTE — Anesthesia Procedure Notes (Signed)
Procedure Name: MAC Date/Time: 11/09/2019 12:31 PM Performed by: Imagene Riches, CRNA Pre-anesthesia Checklist: Patient identified, Emergency Drugs available, Suction available, Patient being monitored and Timeout performed Patient Re-evaluated:Patient Re-evaluated prior to induction Oxygen Delivery Method: Nasal cannula

## 2019-11-09 NOTE — Progress Notes (Signed)
Progress Note  Patient Name: Anthony Dunlap Date of Encounter: 11/09/2019  Primary Cardiologist: Carlyle Dolly, MD   Subjective   Feels ok today, denies CP or SOB, no palps  Inpatient Medications    Scheduled Meds: . sodium chloride   Intravenous Once  . sodium chloride   Intravenous Once  . amitriptyline  20 mg Oral QHS  . aspirin EC  81 mg Oral Q breakfast  . Chlorhexidine Gluconate Cloth  6 each Topical Daily  . collagenase   Topical Daily  . diltiazem  60 mg Oral Q6H  . ferrous sulfate  325 mg Oral Q breakfast  . insulin aspart  0-15 Units Subcutaneous TID WC  . insulin aspart  0-5 Units Subcutaneous QHS  . insulin glargine  20 Units Subcutaneous QHS  . levothyroxine  100 mcg Oral Q breakfast  . metoprolol tartrate  25 mg Oral BID  . multivitamin with minerals  1 tablet Oral Daily  . pantoprazole (PROTONIX) IV  40 mg Intravenous QHS  . polyethylene glycol  17 g Oral Daily  . pravastatin  10 mg Oral Q supper  . primidone  150 mg Oral QHS  . tobramycin  2 drop Both Eyes Q4H while awake   Continuous Infusions: . sodium chloride 50 mL/hr at 11/09/19 0523  . ampicillin-sulbactam (UNASYN) IV 3 g (11/09/19 0525)  . DAPTOmycin (CUBICIN)  IV Stopped (11/08/19 2050)   PRN Meds: acetaminophen **OR** acetaminophen, ALPRAZolam, colchicine, HYDROcodone-acetaminophen, liver oil-zinc oxide   Vital Signs    Vitals:   11/08/19 2137 11/08/19 2335 11/09/19 0405 11/09/19 0734  BP: 112/79 109/71 114/75 120/64  Pulse: 85 68 68 90  Resp: 18 17 18 19   Temp:  98.2 F (36.8 C) 97.8 F (36.6 C) 97.8 F (36.6 C)  TempSrc:  Oral Oral   SpO2: 96% 98% 98% 100%  Weight:   81.2 kg   Height:        Intake/Output Summary (Last 24 hours) at 11/09/2019 0759 Last data filed at 11/08/2019 2344 Gross per 24 hour  Intake 480 ml  Output 1300 ml  Net -820 ml   Filed Weights   11/05/19 1126 11/07/19 0616 11/09/19 0405  Weight: 77.1 kg 79.7 kg 81.2 kg    Telemetry    Afib, rate ok  - Personally Reviewed  ECG    No new EKG to review - Personally Reviewed  Physical Exam   GEN: WD, frail, elderlyNo acute distress.   Neck: No JVD Cardiac: Irreg R&R, soft murmur, no rubs, or gallops.  Respiratory: diminished to auscultation bilaterally with a few rales in the bases. GI: Soft, nontender, non-distended  MS: No edema; L foot bandaged, not disturbed, s/p R BKA Neuro:  Nonfocal  Psych: Normal affect   Labs    Chemistry Recent Labs  Lab 11/06/19 0303 11/06/19 0303 11/07/19 1001 11/08/19 0802 11/09/19 0306  NA 132*   < > 133* 133* 138  K 4.2   < > 3.5 3.4* 4.3  CL 105   < > 106 107 112*  CO2 21*   < > 17* 17* 18*  GLUCOSE 152*   < > 207* 125* 94  BUN 18   < > 15 11 9   CREATININE 0.87   < > 0.98 0.97 1.02  CALCIUM 7.6*   < > 7.9* 7.9* 8.2*  PROT 6.3*  --   --  6.0* 6.2*  ALBUMIN 2.2*  --   --  1.9* 2.2*  AST 28  --   --  18 18  ALT 29  --   --  17 19  ALKPHOS 131*  --   --  124 131*  BILITOT 0.6  --   --  0.5 0.3  GFRNONAA >60   < > >60 >60 >60  GFRAA >60   < > >60 >60 >60  ANIONGAP 6   < > 10 9 8    < > = values in this interval not displayed.     Hematology Recent Labs  Lab 11/06/19 0303 11/06/19 0303 11/06/19 2054 11/07/19 1001 11/08/19 0802  WBC 10.5  --   --  10.3 10.4  RBC 2.74*  --   --  3.22* 3.34*  HGB 6.9*   < > 7.0* 8.3* 8.5*  HCT 22.8*   < > 23.5* 27.0* 27.8*  MCV 83.2  --   --  83.9 83.2  MCH 25.2*  --   --  25.8* 25.4*  MCHC 30.3  --   --  30.7 30.6  RDW 14.6  --   --  15.0 15.3  PLT 606*  --   --  681* 675*   < > = values in this interval not displayed.   Lab Results  Component Value Date   TSH 1.987 11/05/2019    Cardiac EnzymesNo results for input(s): TROPONINI in the last 168 hours. No results for input(s): TROPIPOC in the last 168 hours.   BNPNo results for input(s): BNP, PROBNP in the last 168 hours.   DDimer No results for input(s): DDIMER in the last 168 hours.   Radiology    DG Abd 1 View  Result Date:  11/08/2019 CLINICAL DATA:  Follow-up for abdominal distension EXAM: ABDOMEN - 1 VIEW COMPARISON:  None. FINDINGS: Gaseous distension of the small bowel and colon. Moderate amount of stool throughout the colon. No evidence of pneumoperitoneum, portal venous gas or pneumatosis. No pathologic calcifications along the expected course of the ureters. No acute osseous abnormality. IMPRESSION: Gaseous distension of the small bowel and colon as can be seen with an ileus. Electronically Signed   By: Kathreen Devoid   On: 11/08/2019 15:47   MR FOOT LEFT W WO CONTRAST  Result Date: 11/08/2019 CLINICAL DATA:  Osteomyelitis of the foot. Plain foot soft tissue wound EXAM: MRI OF THE LEFT FOREFOOT WITHOUT AND WITH CONTRAST TECHNIQUE: Multiplanar, multisequence MR imaging of the left foot was performed both before and after administration of intravenous contrast. CONTRAST:  7mL GADAVIST GADOBUTROL 1 MMOL/ML IV SOLN COMPARISON:  None. FINDINGS: TENDONS Peroneal: Peroneal longus tendon intact. Peroneal brevis intact. Posteromedial: Posterior tibial tendon intact. Flexor hallucis longus tendon intact. Flexor digitorum longus tendon intact. Anterior: Tibialis anterior tendon intact. Extensor hallucis longus tendon intact Extensor digitorum longus tendon intact. Achilles:  Mild tendinosis of the distal Achilles tendon. Plantar Fascia: Thickening of the medial band of the plantar fascia at the calcaneal insertion as can be seen with mild plantar fasciitis. LIGAMENTS Lateral: Anterior talofibular ligament intact. Calcaneofibular ligament intact. Posterior talofibular ligament intact. Anterior and posterior tibiofibular ligaments intact. Medial: Deltoid ligament intact. Spring ligament intact. CARTILAGE Ankle Joint: No joint effusion. Normal ankle mortise. No chondral defect. Subtalar Joints/Sinus Tarsi: Normal subtalar joints. No subtalar joint effusion. Normal sinus tarsi. Bones/Soft Tissue: Soft tissue wound along the posterolateral  aspect of the hindfoot overlying the posterior calcaneus. Underlying cortical destruction of the posterolateral calcaneus just inferior to the Achilles insertion with severe surrounding marrow edema most consistent with osteomyelitis. No drainable fluid collection. Soft tissue edema throughout the  hindfoot extending into the ankle concerning for cellulitis. T2 hyperintense signal throughout the plantar musculature likely neurogenic. IMPRESSION: 1. Soft tissue wound along the posterolateral aspect of the hindfoot overlying the posterior calcaneus. Underlying cortical destruction of the posterolateral calcaneus just inferior to the Achilles insertion with severe surrounding marrow edema most consistent with osteomyelitis with surrounding cellulitis. No drainable fluid collection. 2. Mild tendinosis of the distal Achilles tendon. 3. Thickening of the medial band of the plantar fascia at the calcaneal insertion as can be seen with mild plantar fasciitis. Electronically Signed   By: Kathreen Devoid   On: 11/08/2019 11:46    Cardiac Studies   2D echo 11/06/2019 IMPRESSIONS  1. Left ventricular ejection fraction, by estimation, is 60 to 65%. The  left ventricle has normal function. Left ventricular endocardial border  not optimally defined to evaluate regional wall motion. There is mild left  ventricular hypertrophy. Left  ventricular diastolic parameters are indeterminate.  2. Right ventricular systolic function is normal. The right ventricular  size is normal.  3. Left atrial size was mildly dilated.  4. The pericardial effusion is circumferential.  5. The mitral valve is normal in structure. Trivial mitral valve  regurgitation. No evidence of mitral stenosis.  6. The aortic valve was not well visualized. Aortic valve regurgitation  is not visualized. No aortic stenosis is present.   Patient Profile     86 y.o. male with a hx of DM, HLD, prostate CA, PAD s/p L tibial PTA 08/19/2019 w/ subsequent  R-BKA, pelvic/right hip osteomyelitis on chronic antibiotic suppressive therapy, who was admitted 09/16 with sepsis, cards saw 09/18 for the evaluation of Afib, RVR at the request of Dr Waldron Labs.  Assessment & Plan    1.  Atrial fibrillation with RVR  - was on IV Cardizem, now on Dilt 60 mg po q 6 h and metop 25 mg bid - has had 3 doses Dilt so far, continue to follow, change to CD in am if no doses missed, BP ok so far - triggered by acute illness/sepsis - TSH ok - anticoag once ok from a general medical standpoint  2.  Pericardial effusion - small by echo - asymptomatic - likely exudatic - no further eval  3.  Anemia - nadir Hgb 6.9, transfused - w/ anemia, full anticoag high risk, was on heparin 5000 u q 8 hr >> d/c'd after reports of black stools, Plavix (for PAD) D/C'd also - GI has seen - per IM/GI  4.  Sepsis, osteomyelitis L heel - VVS has been asked to see - on ABX, per IM  For questions or updates, please contact [place] Please consult [email or web address] for contact info under Cardiology/STEMI.      Signed, Rosaria Ferries, PA-C  11/09/2019, 7:59 AM

## 2019-11-09 NOTE — TOC Initial Note (Signed)
Transition of Care [facility]) - Initial/Assessment Note    Patient Details  Name: Anthony Dunlap MRN: [id number] Date of Birth: 09/27/1933  Transition of [facility]) CM/SW Contact:    Ninfa Meeker, RN Phone Number: [phone] 11/09/2019, 4:51 PM  Clinical Narrative:  Case Manager spoke with patient's daughter [name] concerning discharge plans.She states her Dad will come home with Home Health services and they are working on getting more care privately. Sadly, patient's wife passed two weeks ago. [name] states that he was setup with [facility] and patient is requesting his same nurse, Levada Dy. CM contacted Joen Laura, [facility] and confirmed patient as well as requesting his same nurse. They have all necessary DME in the home: hospital bed, 3in1, RW and slide board. Janice's niece resides in home with patient to assist with care. TOC Team will continue to monitor.    Expected Discharge Plan: [place] Barriers to Discharge: Continued Medical Work up   Patient Goals and CMS Choice Patient states their goals for this hospitalization and ongoing recovery are:: to go home with home health services CMS Medicare.gov Compare Post Acute Care list provided to:: Patient Represenative (must comment) [name]) Choice offered to / list presented to : Adult Children [name])  Expected Discharge Plan and Services Expected Discharge Plan: [place]   Discharge Planning Services: CM Consult Post Acute Care Choice: Durable Medical Equipment Living arrangements for the past 2 months: Single Family Home                 DME Arranged:  (Has hospital bed, slide board, RW, 3in1) DME Agency: [place]: [facility]), [facility]) Date [place]: 11/09/19 Time [place]: 47 Representative spoke with at [place]: Joen Laura  Prior Living  Arrangements/Services Living arrangements for the past 2 months: [place] with:: Self, Other (Comment) (niece) Patient language and need for interpreter reviewed:: Yes Do you feel safe going back to the place where you live?: Yes      Need for Family Participation in Patient Care: Yes (Comment) Care giver support system in place?: Yes (comment) Current home services: DME, Home RN, Homehealth aide Criminal Activity/Legal Involvement Pertinent to Current Situation/Hospitalization: No - Comment as needed  Activities of Daily Living Home Assistive Devices/Equipment: Bedside commode/3-in-1 ADL Screening (condition at time of admission) Patient's cognitive ability adequate to safely complete daily activities?: Yes Is the patient deaf or have difficulty hearing?: No Does the patient have difficulty seeing, even when wearing glasses/contacts?: No Does the patient have difficulty concentrating, remembering, or making decisions?: No Patient able to express need for assistance with ADLs?: Yes Does the patient have difficulty dressing or bathing?: Yes Independently performs ADLs?: No Does the patient have difficulty walking or climbing stairs?: Yes Weakness of Legs: Both Weakness of Arms/Hands: None  Permission Sought/Granted Permission sought to share information with : Case Manager Permission granted to share information with : Yes, Verbal Permission Granted  Share Information with NAME: [name]  Permission granted to share info w AGENCY: [place]  Permission granted to share info w Relationship: Daughter  Permission granted to share info w Contact Information: [name] [phone]  Emotional Assessment Appearance:: Appears stated age Attitude/Demeanor/Rapport: Gracious Affect (typically observed): Calm Orientation: : Oriented to Self Alcohol / Substance Use: Not Applicable Psych Involvement: No (comment)  Admission diagnosis:  Atrial fibrillation with RVR ([place]) [I48.91] Sepsis  ([place]) [A41.9] Osteomyelitis, unspecified site, unspecified type ([place]) [M86.9] Sepsis, due to unspecified organism, unspecified whether acute organ dysfunction present [facility]) [A41.9] Patient Active Problem List   Diagnosis Date Noted  . Atrial fibrillation with RVR ([place])   . Osteomyelitis ([place])   . Sepsis ([place]) 11/05/2019  . Chronic heel ulcer, left, limited to breakdown of skin ([place]) 10/28/2019  . Acute osteomyelitis of calcaneum, right ([place]) 09/25/2019  . Critical lower limb ischemia 08/11/2019  . Hx of total hip arthroplasty, right 08/06/2019  . Arthritis of right hip 06/12/2019  . Pelvic abscess in male [facility]) 09/11/2018  . Drug-induced hepatitis 06/05/2018  . Calculus of gallbladder without cholecystitis without obstruction   . Fungemia/Candida albicans 05/10/2018  . UTI (urinary tract infection) 05/08/2018  . Complicated UTI (urinary tract infection) 05/08/2018  . 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 Abscess of left thigh   . Pressure injury of skin 02/10/2018  . Enterococcus faecalis infection 01/27/2018  . Abscess   . Psoas abscess ([place]) 01/23/2018  . Sepsis due to Bacteroides species ([place]) 01/22/2018  . Sepsis without acute organ dysfunction ([place])   . DKA (diabetic ketoacidoses) ([place]) 01/17/2018  . Normocytic anemia 01/17/2018  . Chronic anemia 01/03/2018  . Chronic indwelling Foley catheter 01/03/2018  . Elevated alkaline phosphatase level 01/03/2018  . Hypothyroidism 01/03/2018  . Physical deconditioning 01/03/2018  . 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 HLD (hyperlipidemia) 08/19/2013  . Type 2 diabetes mellitus ([place])   . Prostate cancer [facility])    PCP:  [name], MD Pharmacy:   [facility], [address] AT [address]. [place] [address] Phone: [phone] Fax: [phone]     Social Determinants of Health (SDOH)  Interventions    Readmission Risk Interventions Readmission Risk Prevention Plan 09/29/2019 05/09/2018  Transportation Screening Complete Complete  PCP or Specialist Appt within 5-7 Days Not Complete -  Not Complete comments plan for SNF -  PCP or Specialist Appt within 3-5 Days - Complete  Home Care Screening Complete -  Medication Review (RN CM) Referral to [place] or [place] - Complete  Social Work Consult for [place] Planning/Counseling - Complete  Palliative Care Screening - Not Applicable  Medication Review Press photographer) - Complete  Some recent data might be hidden

## 2019-11-09 NOTE — Anesthesia Postprocedure Evaluation (Signed)
Anesthesia Post Note  Patient: Anthony Dunlap  Procedure(s) Performed: ESOPHAGOGASTRODUODENOSCOPY (EGD) WITH PROPOFOL (N/A )     Anesthesia Post Evaluation No complications documented.  Last Vitals:  Vitals:   11/09/19 1620 11/09/19 2106  BP: 109/74 121/77  Pulse: 90 (!) 105  Resp: 19   Temp: 36.9 C   SpO2:      Last Pain:  Vitals:   11/09/19 2040  TempSrc:   PainSc: 0-No pain                 Dericka Ostenson

## 2019-11-09 NOTE — Anesthesia Preprocedure Evaluation (Addendum)
Anesthesia Evaluation  Patient identified by MRN, date of birth, ID band Patient awake    Reviewed: Allergy & Precautions, NPO status , Patient's Chart, lab work & pertinent test results  Airway Mallampati: II  TM Distance: >3 FB     Dental   Pulmonary neg pulmonary ROS,    breath sounds clear to auscultation       Cardiovascular + Peripheral Vascular Disease   Rhythm:Regular Rate:Normal  Aortic angioplasty, s/p BKA   Neuro/Psych  Neuromuscular disease negative psych ROS   GI/Hepatic GERD  Medicated,(+) Hepatitis -, Toxin Related  Endo/Other  diabetes, Insulin DependentHypothyroidism   Renal/GU negative Renal ROS   Prostate CA, recurrent UTIs    Musculoskeletal History right hip/pelvis osteomyelitis on chronic abx suppression   Abdominal   Peds negative pediatric ROS (+)  Hematology  (+) anemia ,   Anesthesia Other Findings   Reproductive/Obstetrics                            Anesthesia Physical Anesthesia Plan  ASA: III  Anesthesia Plan: MAC   Post-op Pain Management:    Induction: Intravenous  PONV Risk Score and Plan: 2 and Ondansetron, Dexamethasone and Midazolam  Airway Management Planned: Nasal Cannula and Simple Face Mask  Additional Equipment: None  Intra-op Plan:   Post-operative Plan:   Informed Consent: I have reviewed the patients History and Physical, chart, labs and discussed the procedure including the risks, benefits and alternatives for the proposed anesthesia with the patient or authorized representative who has indicated his/her understanding and acceptance.     Dental advisory given  Plan Discussed with: CRNA and Surgeon  Anesthesia Plan Comments: (Lab Results      Component                Value               Date                      WBC                      10.4                11/08/2019                HGB                      8.5 (L)              11/08/2019                HCT                      27.8 (L)            11/08/2019                MCV                      83.2                11/08/2019                PLT                      675 (H)  11/08/2019           Lab Results      Component                Value               Date                      NA                       138                 11/09/2019                K                        4.3                 11/09/2019                CO2                      18 (L)              11/09/2019                GLUCOSE                  94                  11/09/2019                BUN                      9                   11/09/2019                CREATININE               1.02                11/09/2019                CALCIUM                  8.2 (L)             11/09/2019                GFRNONAA                 >60                 11/09/2019                GFRAA                    >60                 11/09/2019           ECHO 11/06/19: 1. Left ventricular ejection fraction, by estimation, is 60 to 65%. The left ventricle has normal function. Left ventricular endocardial border not optimally defined to evaluate regional wall motion. There is mild left ventricular hypertrophy. Left ventricular diastolic parameters are indeterminate. 2. Right ventricular systolic function is normal. The right ventricular size is normal. 3. Left atrial size was  mildly dilated. 4. The pericardial effusion is circumferential. 5. The mitral valve is normal in structure. Trivial mitral valve regurgitation. No evidence of mitral stenosis. 6. The aortic valve was not well visualized. Aortic valve regurgitation is not visualized. No aortic stenosis is present.)       Anesthesia Quick Evaluation

## 2019-11-10 DIAGNOSIS — D649 Anemia, unspecified: Secondary | ICD-10-CM

## 2019-11-10 LAB — GLUCOSE, CAPILLARY
Glucose-Capillary: 85 mg/dL (ref 70–99)
Glucose-Capillary: 145 mg/dL — ABNORMAL HIGH (ref 70–99)
Glucose-Capillary: 167 mg/dL — ABNORMAL HIGH (ref 70–99)
Glucose-Capillary: 226 mg/dL — ABNORMAL HIGH (ref 70–99)
Glucose-Capillary: 229 mg/dL — ABNORMAL HIGH (ref 70–99)

## 2019-11-10 LAB — COMPREHENSIVE METABOLIC PANEL
ALT: 17 U/L (ref 0–44)
AST: 19 U/L (ref 15–41)
Albumin: 2 g/dL — ABNORMAL LOW (ref 3.5–5.0)
Alkaline Phosphatase: 122 U/L (ref 38–126)
Anion gap: 6 (ref 5–15)
BUN: 9 mg/dL (ref 8–23)
CO2: 21 mmol/L — ABNORMAL LOW (ref 22–32)
Calcium: 8 mg/dL — ABNORMAL LOW (ref 8.9–10.3)
Chloride: 111 mmol/L (ref 98–111)
Creatinine, Ser: 1.03 mg/dL (ref 0.61–1.24)
GFR calc Af Amer: >60 mL/min (ref >60)
GFR calc non Af Amer: >60 mL/min (ref >60)
Glucose, Bld: 93 mg/dL (ref 70–99)
Potassium: 4.1 mmol/L (ref 3.5–5.1)
Sodium: 138 mmol/L (ref 135–145)
Total Bilirubin: 0.5 mg/dL (ref 0.3–1.2)
Total Protein: 6 g/dL — ABNORMAL LOW (ref 6.5–8.1)

## 2019-11-10 LAB — TYPE AND SCREEN
ABO/RH(D): O POS
Antibody Screen: NEGATIVE
Unit division: 0

## 2019-11-10 LAB — BPAM RBC
Blood Product Expiration Date: 202110202359
Unit Type and Rh: 5100

## 2019-11-10 LAB — CULTURE, BLOOD (ROUTINE X 2)
Culture: NO GROWTH
Culture: NO GROWTH

## 2019-11-10 MED ORDER — METOPROLOL TARTRATE 25 MG PO TABS
25.0000 mg | ORAL_TABLET | Freq: Three times a day (TID) | ORAL | Status: DC
  Administered 2019-11-10 – 2019-11-12 (×6): 25 mg via ORAL
  Filled 2019-11-10 (×6): qty 1

## 2019-11-10 NOTE — Progress Notes (Signed)
Pharmacy Antibiotic Note  Anthony Dunlap is a 86 y.o. male admitted on 11/05/2019 with osteomyelitis.  Pharmacy has been consulted for daptomycin and Unasyn dosing. Cr remains stable, baseline CK 68.   Plan: Continue daptomycin 700 mg q24h Continue Unasyn 3 grams q6h Watch renal function Check weekly CK   Height: 5\' 8"  (172.7 cm) Weight: 82.8 kg (182 lb 8 oz) IBW/kg (Calculated) : 68.4  Temp (24hrs), Avg:97.9 F (36.6 C), Min:97.7 F (36.5 C), Max:98.4 F (36.9 C)  Recent Labs  Lab 11/05/19 1203 11/05/19 1203 11/05/19 1421 11/05/19 1817 11/06/19 0303 11/07/19 1001 11/08/19 0802 11/09/19 0306 11/10/19 0323  WBC 11.0*  --   --   --  10.5 10.3 10.4  --   --   CREATININE 1.04   < >  --   --  0.87 0.98 0.97 1.02 1.03  LATICACIDVEN 1.3  --  2.1* 1.9  --   --   --   --   --    < > = values in this interval not displayed.    Estimated Creatinine Clearance: 56 mL/min (by C-G formula based on SCr of 1.03 mg/dL).    Allergies  Allergen Reactions   Fluconazole Other (See Comments)    Drug-induced hepatitis    Antimicrobials this admission: Vanco 9/16 >> 9/18 Cefepime 9/16 >> 9/18 Flagyl 9/16 >> 9/18 Daptomycin 9/18 >> Unaysn 9/18 >>  Microbiology results: 9/16BCx: ngtd 9/16UCx: multiple spp, suggest recollection  Thank you for allowing pharmacy to be a part of this patients care.  Arrie Senate, PharmD, BCPS Clinical Pharmacist [phone] Please check AMION for all [place] numbers 11/10/2019

## 2019-11-10 NOTE — Progress Notes (Signed)
Occupational Therapy Treatment Patient Details Name: Anthony Dunlap MRN: [id number] DOB: February 21, 1933 Today's Date: 11/10/2019    History of present illness Pt is an 86 y.o. male admitted from [facility]F on 11/[facility]/19 with sepsis, nonhealing L foot ulcer, afib with RVR. Imaging showed L calcenuous osteomyelitis; potential for L BKA. 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   OT comments  Pt progressing gradually with OT goals with deficits in strength, endurance, safety awareness, and sitting balance. Pt overall Min A for UB ADLs seated in recliner with HR increasing to 130s during this time. Pt required Max A x 2 for scoot transfer from recliner back to bed with pt requiring max cues for safety due to impulsivity increase during this task. Pt's daughter present and reports continued plan to take pt home. Continue to strongly recommend post-acute rehab due to extensive assist required for transfers and LB ADLs. However, if pt to DC home, recommend max HH services.    Follow Up Recommendations  SNF;Supervision/Assistance - 24 hour    Equipment Recommendations  3 in 1 bedside commode (drop-arm BSC)    Recommendations for Other Services      Precautions / Restrictions Precautions Precautions: Fall Precaution Comments: R BKA Restrictions Weight Bearing Restrictions: Yes RLE Weight Bearing: Non weight bearing LLE Weight Bearing: Non weight bearing Other Position/Activity Restrictions: NWB conservatively due to osteomyelitis of heel       Mobility Bed Mobility Overal bed mobility: Needs Assistance Bed Mobility: Sit to Supine;Rolling Rolling: Modified independent (Device/Increase time)   Supine to sit: Mod assist;HOB elevated Sit to supine: Min assist   General bed mobility comments: Assistance to manuever hips back in bed to supine. Impulsively rolling to EOB increasing fall risk  Transfers Overall transfer level: Needs  assistance Equipment used: None Transfers: Lateral/Scoot Transfers          Lateral/Scoot Transfers: Max assist;+2 physical assistance;+2 safety/equipment General transfer comment: Max A x 2 for recliner to bed scoot transfer, Pt attempting to use bedrail to assist but could not advance hips enough to clear surface. Therapist used chuck pad to scoot pt on to bed with pt poor sitting balance. transferred into side lying and then supine. Pt with decreased following of safety instructions    Balance Overall balance assessment: Needs assistance Sitting-balance support: Feet supported;Bilateral upper extremity supported Sitting balance-Leahy Scale: Poor Sitting balance - Comments: Reliant on UE support to maintain static sitting                                   ADL either performed or assessed with clinical judgement   ADL Overall ADL's : Needs assistance/impaired   Eating/Feeding Details (indicate cue type and reason): Noted with peaches all over self, reports intermittent hand tremors and difficulty eat due to slouched position in chair Grooming: Set up;Sitting;Wash/dry face Grooming Details (indicate cue type and reason): Setup to wash face sitting in chair         Upper Body Dressing : Minimal assistance;Sitting Upper Body Dressing Details (indicate cue type and reason): Min A to don new hospital gown                    General ADL Comments: Pt with decreased following of safety commands during transfers. Decreased strength, endurance, and balance also remain as deficits impacting ability to complete ADLs     Vision  Vision Assessment?: No apparent visual deficits   Perception     Praxis      Cognition Arousal/Alertness: Awake/alert Behavior During Therapy: WFL for tasks assessed/performed Overall Cognitive Status: Impaired/Different from baseline Area of Impairment: Attention;Following commands;Problem solving;Awareness;Safety/judgement                    Current Attention Level: Selective   Following Commands: Follows multi-step commands inconsistently Safety/Judgement: Decreased awareness of safety;Decreased awareness of deficits Awareness: Emergent Problem Solving: Difficulty sequencing;Requires verbal cues;Requires tactile cues General Comments: Once attempting transfer back to bed, pt with decreased following of commands, impulsive, requiring max safety cues as pt attempting to transfer without staff assist On return to bed, pt with beginning bowel incontinence. Attempted contact to NTs for further assistance, charge RN.         Exercises     Shoulder Instructions       General Comments HR increasing to 130s during seated ADLs. Daughter present in [place]. Educated both on safety at home, wheelchair with cushion and possible amputation pad supports of wheelchair pending results of surgery.     Pertinent Vitals/ Pain       Pain Assessment: Faces Faces Pain Scale: Hurts a little bit Pain Location: generalized Pain Descriptors / Indicators: Grimacing;Guarding Pain Intervention(s): Limited activity within patient's tolerance;Monitored during session  Home Living                                          Prior Functioning/Environment              Frequency  Min 2X/week        Progress Toward Goals  OT Goals(current goals can now be found in the care plan section)  Progress towards OT goals: Progressing toward goals  Acute Rehab OT Goals Patient Stated Goal: "I'm not going back to rehab, I'm going home" OT Goal Formulation: With patient Time For Goal Achievement: 11/21/19 Potential to Achieve Goals: Good ADL Goals Pt Will Perform Grooming: with supervision;sitting Pt Will Perform Upper Body Dressing: with supervision;sitting Pt Will Perform Lower Body Dressing: with mod assist;sitting/lateral leans Pt Will Transfer to Toilet: with mod assist;with transfer board;bedside  commode Pt Will Perform Toileting - Clothing Manipulation and hygiene: with mod assist;sitting/lateral leans Additional ADL Goal #1: Pt will participate in wide range reaching and ADL at EOB without LOB.  Plan Discharge plan remains appropriate    Co-evaluation                 AM-PAC OT "6 Clicks" Daily Activity     Outcome Measure   Help from another person eating meals?: A Little Help from another person taking care of personal grooming?: A Little Help from another person toileting, which includes using toliet, bedpan, or urinal?: Total Help from another person bathing (including washing, rinsing, drying)?: A Lot Help from another person to put on and taking off regular upper body clothing?: A Little Help from another person to put on and taking off regular lower body clothing?: A Lot 6 Click Score: 14    End of Session Equipment Utilized During Treatment: Gait belt  OT Visit Diagnosis: Muscle weakness (generalized) (M62.81)   Activity Tolerance Patient tolerated treatment well   Patient Left in bed;with call bell/phone within reach;with bed alarm set;with nursing/sitter in room   Nurse Communication Mobility status (RN present at end of session and assisting  with transfer)        Time: 7939-6886 OT Time Calculation (min): 40 min  Charges: OT General Charges $OT Visit: 1 Visit OT Treatments $Self Care/Home Management : 8-22 mins $Therapeutic Activity: 23-37 mins  Layla Maw, OTR/L   Layla Maw 11/10/2019, 1:52 PM

## 2019-11-10 NOTE — Progress Notes (Signed)
Patient ID: Anthony Dunlap, male   DOB: 1933/12/24, 86 y.o.   MRN: [id number]         [facility]  Date of Admission:  11/05/2019   Total days of antibiotics 6        Day 4 daptomycin and ampicillin sulbactam         ASSESSMENT: He has a large left heel pressure sore with underlying calcaneal osteomyelitis.  I think he would be best served by BKA.  He plans on talking to his wife and daughter about this later today.  PLAN: 1. Continue current antibiotics for now  Principal Problem:   Acute osteomyelitis of left calcaneus ([name]) Active Problems:   HLD (hyperlipidemia)   Chronic anemia   Hypothyroidism   Sepsis ([name])   Atrial fibrillation with RVR ([name])   Scheduled Meds:  sodium chloride   Intravenous Once   sodium chloride   Intravenous Once   amitriptyline  20 mg Oral QHS   aspirin EC  81 mg Oral Q breakfast   Chlorhexidine Gluconate Cloth  6 each Topical Daily   collagenase   Topical Daily   diltiazem  60 mg Oral Q6H   ferrous sulfate  325 mg Oral Q breakfast   insulin aspart  0-15 Units Subcutaneous TID WC   insulin aspart  0-5 Units Subcutaneous QHS   insulin glargine  15 Units Subcutaneous QHS   levothyroxine  100 mcg Oral Q breakfast   metoprolol tartrate  25 mg Oral Q8H   multivitamin with minerals  1 tablet Oral Daily   pantoprazole (PROTONIX) IV  40 mg Intravenous QHS   polyethylene glycol  17 g Oral Daily   pravastatin  10 mg Oral Q supper   primidone  150 mg Oral QHS   tobramycin  2 drop Both Eyes Q4H while awake   Continuous Infusions:  sodium chloride 50 mL/hr at 11/09/19 2344   ampicillin-sulbactam (UNASYN) IV 3 g (11/10/19 1210)   DAPTOmycin (CUBICIN)  IV 700 mg (11/09/19 2100)   PRN Meds:.acetaminophen **OR** acetaminophen, ALPRAZolam, colchicine, HYDROcodone-acetaminophen, liver oil-zinc oxide   SUBJECTIVE: He thinks he has had the ulcer on his left heel for about 1 year.  He says that it just will  not heal.  Review of Systems: Review of Systems  Constitutional: Negative for chills, diaphoresis and fever.  Musculoskeletal: Negative for joint pain.    Allergies  Allergen Reactions   Fluconazole Other (See Comments)    Drug-induced hepatitis    OBJECTIVE: Vitals:   11/10/19 0555 11/10/19 0818 11/10/19 0829 11/10/19 1210  BP: 126/75 (!) 144/87  (!) 121/91  Pulse: (!) 106 86 (!) 130   Resp: 20 20    Temp: 97.8 F (36.6 C) 97.8 F (36.6 C)    TempSrc: Oral Oral    SpO2: 96% 93%    Weight:      Height:       Body mass index is 27.75 kg/m.  Physical Exam Constitutional:      Comments: He is resting quietly in bed.  Cardiovascular:     Rate and Rhythm: Rhythm irregular.  Musculoskeletal:     Comments: He has a clean dry gauze dressing on his left foot.  Psychiatric:        Mood and Affect: Mood normal.       Lab Results Lab Results  Component Value Date   WBC 10.4 11/08/2019   HGB 8.5 (L) 11/08/2019   HCT 27.8 (L) 11/08/2019  MCV 83.2 11/08/2019   PLT 675 (H) 11/08/2019    Lab Results  Component Value Date   CREATININE 1.03 11/10/2019   BUN 9 11/10/2019   NA 138 11/10/2019   K 4.1 11/10/2019   CL 111 11/10/2019   CO2 21 (L) 11/10/2019    Lab Results  Component Value Date   ALT 17 11/10/2019   AST 19 11/10/2019   GGT 14 01/04/2018   ALKPHOS 122 11/10/2019   BILITOT 0.5 11/10/2019     Microbiology: Recent Results (from the past 240 hour(s))  Urine culture     Status: Abnormal   Collection Time: 11/05/19 11:55 AM   Specimen: In/Out Cath Urine  Result Value Ref Range Status   Specimen Description   Final    IN/OUT CATH URINE Performed at [facility] [address] [place], [place] [address]    Special Requests   Final    NONE Performed at [facility], [address], [place], [place] [address]    Culture MULTIPLE SPECIES PRESENT, SUGGEST RECOLLECTION (A)  Final   Report Status 11/06/2019 FINAL  Final  Blood Culture (routine x 2)      Status: None   Collection Time: 11/05/19 12:03 PM   Specimen: BLOOD  Result Value Ref Range Status   Specimen Description BLOOD  Final   Special Requests NONE  Final   Culture   Final    NO GROWTH 5 DAYS Performed at [facility], [address], [place], [place] [address]    Report Status 11/10/2019 FINAL  Final  Blood Culture (routine x 2)     Status: None   Collection Time: 11/05/19 12:12 PM   Specimen: BLOOD  Result Value Ref Range Status   Specimen Description BLOOD  Final   Special Requests NONE  Final   Culture   Final    NO GROWTH 5 DAYS Performed at [facility], [address] [place], [place] [address]    Report Status 11/10/2019 FINAL  Final  SARS Coronavirus 2 by RT PCR (hospital order, performed in [facility] hospital lab) Nasopharyngeal Nasopharyngeal Swab     Status: None   Collection Time: 11/05/19 12:17 PM   Specimen: Nasopharyngeal Swab  Result Value Ref Range Status   SARS Coronavirus 2 NEGATIVE NEGATIVE Final    Comment: (NOTE) SARS-CoV-2 target nucleic acids are NOT DETECTED.  The SARS-CoV-2 RNA is generally detectable in upper and lower respiratory specimens during the acute phase of infection. The lowest concentration of SARS-CoV-2 viral copies this assay can detect is 250 copies / mL. A negative result does not preclude SARS-CoV-2 infection and should not be used as the sole basis for treatment or other patient management decisions.  A negative result may occur with improper specimen collection / handling, submission of specimen other than nasopharyngeal swab, presence of viral mutation(s) within the areas targeted by this assay, and inadequate number of viral copies (<250 copies / mL). A negative result must be combined with clinical observations, patient history, and epidemiological information.  Fact Sheet for Patients:   StrictlyIdeas.no  Fact Sheet for Healthcare  Providers: BankingDealers.co.za  This test is not yet approved or  cleared by the [place] FDA and has been authorized for detection and/or diagnosis of SARS-CoV-2 by FDA under an Emergency Use Authorization (EUA).  This EUA will remain in effect (meaning this test can be used) for the duration of the COVID-19 declaration under Section 564(b)(1) of the Act, 21 U.S.C. section 360bbb-3(b)(1), unless the authorization  is terminated or revoked sooner.  Performed at [facility], [address], [place], [place] [address]     Michel Bickers, [place] [facility] [place] [phone] pager   [phone] cell 11/10/2019, 12:17 PM

## 2019-11-10 NOTE — Progress Notes (Signed)
PROGRESS NOTE    Anthony Dunlap  YJE:[id number] DOB: January 01, 1934 DOA: 11/05/2019 PCP: [name], MD   Chief Complaint  Patient presents with  . possible sepsis   Brief Narrative: 86 year old with DM2, prostate cancer, severe PVD followed by [name] status post right BKA and history of hip osteomyelitis on chronic suppressive antibiotic therapy was admitted from SNFfor fever and new onset A. fib with RVR. On admission patient was hemodynamically stable but his hemoglobin was noted to be markedly decreased from last month. History per patient's daughter who is his caregiver noted that he had been having black stool for a week prior to that. Patient was not started on anticoagulation anticoagulation for his atrial fibrillation. Patient was transfused 1 unit PRBC. Patient was seen by cardiology after being placed on a Cardizem drip. Cardizem was discontinued and patient was placed on Lopressor with reasonable control of his heart rate. Patient was seen by [name] who also follows him as an outpatient. He underwent an MRI which revealed osteomyelitis of his left heel. Skin surgery was called to consult regarding blood flow. Patient underwent EGD on 11/09/2019 which was unrevealing.  Subjective:  Feeling okay, HR in 110-140s a fib. No chest pain or palpitation or SOB Left ankle in dressing, no pain, rt ABK w/ healed stump Coughing some Soreness on the left  Assessment & Plan:  Left heel pressure ulcer with underlying calcaneal osteomyelitis present on admission: Appreciate ID and Dr. Nona Dell inputs- will likely will need left BKA patient is still not decided.Continue on IV antibiotics-Dapto/Unasyn  Sepsis POA 2/2 #1:as evident by fever, tachypnea, tachycardia and lactic acidosis. Cont on iv Abx as above.  A. fib with RVR cardiology on board increasing beta-blocker dose AS hr borderline controlled in 100 140s.  Not on anticoagulation due to anemia on admission and currently on hold due to  anticipated procedure and start post procedure- but will further defer to cardiology.   Anemia of chronic disease with reported melena S/P EGD 9/20 was normal.  Monitor H&H.  Continue Protonix.  As per GI okay to restart anticoagulation for atrial fibrillation when warranted currently on hold due to anticipated procedure Recent Labs  Lab 11/05/19 1203 11/06/19 0303 11/06/19 2054 11/07/19 1001 11/08/19 0802  HGB 7.4* 6.9* 7.0* 8.3* 8.5*  HCT 24.4* 22.8* 23.5* 27.0* 27.8*   Pericardial effusion seen smoking: No further work-up at this time per cardiology  Hypokalemia/hypomagnesemia:monitor and replete.  PVD/Lt OVZ:CHYI calcaneal osteomyelitis  DM2:uncontrolled hba1c at 8.5. sugar stable, keep on ssi and monitor cbg. Recent Labs  Lab 11/09/19 2104 11/09/19 2341 11/10/19 0553 11/10/19 0816 11/10/19 1221  GLUCAP 136* 118* 85 145* 167*   Lab Results  Component Value Date   HGBA1C 8.5 (H) 11/05/2019   Anxiety/Depression:cont home Amitryptaline, primodine.  Hypothyroidism: continue current Synthroid dose.  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  Status is: Inpatient Remains inpatient appropriate because:Inpatient level of care appropriate due to severity of illness  Dispo: The patient is from: Home/SNF              Anticipated d/c is to: Home vs SNF              Anticipated d/c date is: > 3 days              Patient currently  is not medically stable to d/c. Nutrition: Diet Order            Diet heart healthy/carb modified Room service appropriate? Yes; Fluid consistency: Thin  Diet effective now                Body mass index is 27.75 kg/m. Pressure Ulcer: Pressure Injury 11/09/19 Buttocks Right;Proximal Stage 2 -  Partial thickness loss of dermis presenting as a shallow open injury  with a red, pink wound bed without slough. (Active)  11/09/19 1915  Location: Buttocks  Location Orientation: Right;Proximal  Staging: Stage 2 -  Partial thickness loss of dermis presenting as a shallow open injury with a red, pink wound bed without slough.  Wound Description (Comments):   Present on Admission:    Consultants:see note  Procedures:see note Microbiology:see note Blood Culture    Component Value Date/Time   SDES BLOOD 11/05/2019 1212   SPECREQUEST NONE 11/05/2019 1212   CULT  11/05/2019 1212    NO GROWTH 5 DAYS Performed at [facility], [address], [place], [place] [address]    REPTSTATUS 11/10/2019 FINAL 11/05/2019 1212    Other culture-see note  Medications: Scheduled Meds: . sodium chloride   Intravenous Once  . sodium chloride   Intravenous Once  . amitriptyline  20 mg Oral QHS  . aspirin EC  81 mg Oral Q breakfast  . Chlorhexidine Gluconate Cloth  6 each Topical Daily  . collagenase   Topical Daily  . diltiazem  60 mg Oral Q6H  . ferrous sulfate  325 mg Oral Q breakfast  . insulin aspart  0-15 Units Subcutaneous TID WC  . insulin aspart  0-5 Units Subcutaneous QHS  . insulin glargine  15 Units Subcutaneous QHS  . levothyroxine  100 mcg Oral Q breakfast  . metoprolol tartrate  25 mg Oral Q8H  . multivitamin with minerals  1 tablet Oral Daily  . pantoprazole (PROTONIX) IV  40 mg Intravenous QHS  . polyethylene glycol  17 g Oral Daily  . pravastatin  10 mg Oral Q supper  . primidone  150 mg Oral QHS  . tobramycin  2 drop Both Eyes Q4H while awake   Continuous Infusions: . sodium chloride 50 mL/hr at 11/09/19 2344  . ampicillin-sulbactam (UNASYN) IV 3 g (11/10/19 0552)  . DAPTOmycin (CUBICIN)  IV 700 mg (11/09/19 2100)    Antimicrobials: Anti-infectives (From admission, onward)   Start     Dose/Rate Route Frequency Ordered Stop   11/07/19 1200  DAPTOmycin (CUBICIN) 700 mg in sodium chloride 0.9 % IVPB        700 mg 228 mL/hr over 30 Minutes  Intravenous Daily 11/07/19 1056     11/07/19 1200  Ampicillin-Sulbactam (UNASYN) 3 g in sodium chloride 0.9 % 100 mL IVPB        3 g 200 mL/hr over 30 Minutes Intravenous Every 6 hours 11/07/19 1056     11/06/19 1300  vancomycin (VANCOREADY) IVPB 1250 mg/250 mL  Status:  Discontinued        1,250 mg 166.7 mL/hr over 90 Minutes Intravenous Every 24 hours 11/05/19 1324 11/07/19 1030   11/06/19 0000  ceFEPIme (MAXIPIME) 2 g in sodium chloride 0.9 % 100 mL IVPB  Status:  Discontinued        2 g 200 mL/hr over 30 Minutes Intravenous Every 12 hours 11/05/19 1648 11/07/19 1030   11/05/19 2200  metroNIDAZOLE (FLAGYL) IVPB 500 mg  Status:  Discontinued  500 mg 100 mL/hr over 60 Minutes Intravenous Every 8 hours 11/05/19 1637 11/07/19 1030   11/05/19 1300  vancomycin (VANCOREADY) IVPB 2000 mg/400 mL        2,000 mg 200 mL/hr over 120 Minutes Intravenous  Once 11/05/19 1226 11/05/19 1453   11/05/19 1230  metroNIDAZOLE (FLAGYL) IVPB 500 mg        500 mg 100 mL/hr over 60 Minutes Intravenous  Once 11/05/19 1223 11/05/19 1345   11/05/19 1215  ceFEPIme (MAXIPIME) 2 g in sodium chloride 0.9 % 100 mL IVPB        2 g 200 mL/hr over 30 Minutes Intravenous  Once 11/05/19 1201 11/05/19 1254     Objective: Vitals: Today's Vitals   11/10/19 0448 11/10/19 0555 11/10/19 0818 11/10/19 0829  BP: 126/68 126/75 (!) 144/87   Pulse: 95 (!) 106 86 (!) 130  Resp: 17 20 20    Temp: 98.1 F (36.7 C) 97.8 F (36.6 C) 97.8 F (36.6 C)   TempSrc: Axillary Oral Oral   SpO2: 98% 96% 93%   Weight: 82.8 kg     Height:      PainSc:        Intake/Output Summary (Last 24 hours) at 11/10/2019 0903 Last data filed at 11/09/2019 1800 Gross per 24 hour  Intake 1365.11 ml  Output 1000 ml  Net 365.11 ml   Filed Weights   11/09/19 0405 11/09/19 1119 11/10/19 0448  Weight: 81.2 kg 81.2 kg 82.8 kg   Weight change: 0 kg  Intake/Output from previous day: 09/20 0701 - 09/21 0700 In: 1365.1 [I.V.:1165.1; IV  Piggyback:200] Out: 1000 [Urine:1000] Intake/Output this shift: No intake/output data recorded.  Examination: General exam: AAOx2-3 ,NAD, weak appearing. HEENT:Oral mucosa moist, Ear/Nose WNL grossly,dentition normal. Respiratory system: bilaterally diminshed,no wheezing or crackles,no use of accessory muscle, non tender. Cardiovascular system: S1 & S2 +, regular, No JVD. Gastrointestinal system: Abdomen soft, NT,ND, BS+. Nervous System:Alert, awake, moving extremities and grossly nonfocal Extremities: No edema, RT bka, LET ANKLE W/ WOUND SEE PIC Skin: No rashes,no icterus. MSK: Normal muscle bulk,tone, power    Data Reviewed: I have personally reviewed following labs and imaging studies CBC: Recent Labs  Lab 11/05/19 1203 11/06/19 0303 11/06/19 2054 11/07/19 1001 11/08/19 0802  WBC 11.0* 10.5  --  10.3 10.4  NEUTROABS 8.6*  --   --  7.9* 6.6  HGB 7.4* 6.9* 7.0* 8.3* 8.5*  HCT 24.4* 22.8* 23.5* 27.0* 27.8*  MCV 82.2 83.2  --  83.9 83.2  PLT 622* 606*  --  681* 825*   Basic Metabolic Panel: Recent Labs  Lab 11/06/19 0303 11/07/19 1001 11/08/19 0802 11/09/19 0306 11/10/19 0323  NA 132* 133* 133* 138 138  K 4.2 3.5 3.4* 4.3 4.1  CL 105 106 107 112* 111  CO2 21* 17* 17* 18* 21*  GLUCOSE 152* 207* 125* 94 93  BUN 18 15 11 9 9   CREATININE 0.87 0.98 0.97 1.02 1.03  CALCIUM 7.6* 7.9* 7.9* 8.2* 8.0*  MG 1.7  --  1.6* 2.0  --   PHOS 2.6  --   --   --   --    GFR: Estimated Creatinine Clearance: 56 mL/min (by C-G formula based on SCr of 1.03 mg/dL). Liver Function Tests: Recent Labs  Lab 11/05/19 1203 11/06/19 0303 11/08/19 0802 11/09/19 0306 11/10/19 0323  AST 43* 28 18 18 19   ALT 33 29 17 19 17   ALKPHOS 150* 131* 124 131* 122  BILITOT 0.7 0.6 0.5 0.3  0.5  PROT 7.0 6.3* 6.0* 6.2* 6.0*  ALBUMIN 2.6* 2.2* 1.9* 2.2* 2.0*   No results for input(s): LIPASE, AMYLASE in the last 168 hours. No results for input(s): AMMONIA in the last 168 hours. Coagulation  Profile: Recent Labs  Lab 11/05/19 1203  INR 1.2   Cardiac Enzymes: Recent Labs  Lab 11/07/19 1001  CKTOTAL 68   BNP (last 3 results) No results for input(s): PROBNP in the last 8760 hours. HbA1C: No results for input(s): HGBA1C in the last 72 hours. CBG: Recent Labs  Lab 11/09/19 1647 11/09/19 2104 11/09/19 2341 11/10/19 0553 11/10/19 0816  GLUCAP 230* 136* 118* 85 145*   Lipid Profile: No results for input(s): CHOL, HDL, LDLCALC, TRIG, CHOLHDL, LDLDIRECT in the last 72 hours. Thyroid Function Tests: No results for input(s): TSH, T4TOTAL, FREET4, T3FREE, THYROIDAB in the last 72 hours. Anemia Panel: No results for input(s): VITAMINB12, FOLATE, FERRITIN, TIBC, IRON, RETICCTPCT in the last 72 hours. Sepsis Labs: Recent Labs  Lab 11/05/19 1203 11/05/19 1421 11/05/19 1817  LATICACIDVEN 1.3 2.1* 1.9    Recent Results (from the past 240 hour(s))  Urine culture     Status: Abnormal   Collection Time: 11/05/19 11:55 AM   Specimen: In/Out Cath Urine  Result Value Ref Range Status   Specimen Description   Final    IN/OUT CATH URINE Performed at [facility], [address] [place], [place] [address]    Special Requests   Final    NONE Performed at [facility], [address], [place], [place] [address]    Culture MULTIPLE SPECIES PRESENT, SUGGEST RECOLLECTION (A)  Final   Report Status 11/06/2019 FINAL  Final  Blood Culture (routine x 2)     Status: None   Collection Time: 11/05/19 12:03 PM   Specimen: BLOOD  Result Value Ref Range Status   Specimen Description BLOOD  Final   Special Requests NONE  Final   Culture   Final    NO GROWTH 5 DAYS Performed at [facility], [address] [place], [place] [address]    Report Status 11/10/2019 FINAL  Final  Blood Culture (routine x 2)     Status: None   Collection Time: 11/05/19 12:12 PM   Specimen: BLOOD  Result Value Ref Range Status   Specimen Description BLOOD  Final   Special Requests NONE  Final    Culture   Final    NO GROWTH 5 DAYS Performed at [facility], [address], [place],  [address]    Report Status 11/10/2019 FINAL  Final  SARS Coronavirus 2 by RT PCR (hospital order, performed in [facility] hospital lab) Nasopharyngeal Nasopharyngeal Swab     Status: None   Collection Time: 11/05/19 12:17 PM   Specimen: Nasopharyngeal Swab  Result Value Ref Range Status   SARS Coronavirus 2 NEGATIVE NEGATIVE Final    Comment: (NOTE) SARS-CoV-2 target nucleic acids are NOT DETECTED.  The SARS-CoV-2 RNA is generally detectable in upper and lower respiratory specimens during the acute phase of infection. The lowest concentration of SARS-CoV-2 viral copies this assay can detect is 250 copies / mL. A negative result does not preclude SARS-CoV-2 infection and should not be used as the sole basis for treatment or other patient management decisions.  A negative result may occur with improper specimen collection / handling, submission of specimen other than nasopharyngeal swab, presence of viral mutation(s) within the areas targeted by this assay, and inadequate number of viral copies (<250 copies /  mL). A negative result must be combined with clinical observations, patient history, and epidemiological information.  Fact Sheet for Patients:   StrictlyIdeas.no  Fact Sheet for Healthcare Providers: BankingDealers.co.za  This test is not yet approved or  cleared by the [place] FDA and has been authorized for detection and/or diagnosis of SARS-CoV-2 by FDA under an Emergency Use Authorization (EUA).  This EUA will remain in effect (meaning this test can be used) for the duration of the COVID-19 declaration under Section 564(b)(1) of the Act, 21 U.S.C. section 360bbb-3(b)(1), unless the authorization is terminated or revoked sooner.  Performed at [facility], [address] [place], [place] [address]      Radiology  Studies: DG Abd 1 View  Result Date: 11/08/2019 CLINICAL DATA:  Follow-up for abdominal distension EXAM: ABDOMEN - 1 VIEW COMPARISON:  None. FINDINGS: Gaseous distension of the small bowel and colon. Moderate amount of stool throughout the colon. No evidence of pneumoperitoneum, portal venous gas or pneumatosis. No pathologic calcifications along the expected course of the ureters. No acute osseous abnormality. IMPRESSION: Gaseous distension of the small bowel and colon as can be seen with an ileus. Electronically Signed   By: Kathreen Devoid   On: 11/08/2019 15:47   MR FOOT LEFT W WO CONTRAST  Result Date: 11/08/2019 CLINICAL DATA:  Osteomyelitis of the foot. Plain foot soft tissue wound EXAM: MRI OF THE LEFT FOREFOOT WITHOUT AND WITH CONTRAST TECHNIQUE: Multiplanar, multisequence MR imaging of the left foot was performed both before and after administration of intravenous contrast. CONTRAST:  59mL GADAVIST GADOBUTROL 1 MMOL/ML IV SOLN COMPARISON:  None. FINDINGS: TENDONS Peroneal: Peroneal longus tendon intact. Peroneal brevis intact. Posteromedial: Posterior tibial tendon intact. Flexor hallucis longus tendon intact. Flexor digitorum longus tendon intact. Anterior: Tibialis anterior tendon intact. Extensor hallucis longus tendon intact Extensor digitorum longus tendon intact. Achilles:  Mild tendinosis of the distal Achilles tendon. Plantar Fascia: Thickening of the medial band of the plantar fascia at the calcaneal insertion as can be seen with mild plantar fasciitis. LIGAMENTS Lateral: Anterior talofibular ligament intact. Calcaneofibular ligament intact. Posterior talofibular ligament intact. Anterior and posterior tibiofibular ligaments intact. Medial: Deltoid ligament intact. Spring ligament intact. CARTILAGE Ankle Joint: No joint effusion. Normal ankle mortise. No chondral defect. Subtalar Joints/Sinus Tarsi: Normal subtalar joints. No subtalar joint effusion. Normal sinus tarsi. Bones/Soft Tissue: Soft  tissue wound along the posterolateral aspect of the hindfoot overlying the posterior calcaneus. Underlying cortical destruction of the posterolateral calcaneus just inferior to the Achilles insertion with severe surrounding marrow edema most consistent with osteomyelitis. No drainable fluid collection. Soft tissue edema throughout the hindfoot extending into the ankle concerning for cellulitis. T2 hyperintense signal throughout the plantar musculature likely neurogenic. IMPRESSION: 1. Soft tissue wound along the posterolateral aspect of the hindfoot overlying the posterior calcaneus. Underlying cortical destruction of the posterolateral calcaneus just inferior to the Achilles insertion with severe surrounding marrow edema most consistent with osteomyelitis with surrounding cellulitis. No drainable fluid collection. 2. Mild tendinosis of the distal Achilles tendon. 3. Thickening of the medial band of the plantar fascia at the calcaneal insertion as can be seen with mild plantar fasciitis. Electronically Signed   By: Kathreen Devoid   On: 11/08/2019 11:46     LOS: 5 days   Antonieta Pert, MD [facility]  11/10/2019, 9:03 AM

## 2019-11-10 NOTE — Progress Notes (Addendum)
  Progress Note    11/10/2019 7:40 AM 1 Day Post-Op  Subjective:  Has not yet discussed proceeding with L BKA with daughter.  Pain in L heel only with weightbearing   Vitals:   11/10/19 0448 11/10/19 0555  BP: 126/68 126/75  Pulse: 95 (!) 106  Resp: 17 20  Temp: 98.1 F (36.7 C) 97.8 F (36.6 C)  SpO2: 98% 96%   Physical Exam: Lungs:  Non labored Incisions:  R BKA incision with dry eschar; L heel dressing left in place Abdomen:  soft Neurologic: A&O  CBC    Component Value Date/Time   WBC 10.4 11/08/2019 0802   RBC 3.34 (L) 11/08/2019 0802   HGB 8.5 (L) 11/08/2019 0802   HCT 27.8 (L) 11/08/2019 0802   PLT 675 (H) 11/08/2019 0802   MCV 83.2 11/08/2019 0802   MCH 25.4 (L) 11/08/2019 0802   MCHC 30.6 11/08/2019 0802   RDW 15.3 11/08/2019 0802   LYMPHSABS 2.0 11/08/2019 0802   MONOABS 0.9 11/08/2019 0802   EOSABS 0.7 (H) 11/08/2019 0802   BASOSABS 0.1 11/08/2019 0802    BMET    Component Value Date/Time   NA 138 11/10/2019 0323   K 4.1 11/10/2019 0323   CL 111 11/10/2019 0323   CO2 21 (L) 11/10/2019 0323   GLUCOSE 93 11/10/2019 0323   BUN 9 11/10/2019 0323   CREATININE 1.03 11/10/2019 0323   CREATININE 1.07 10/28/2019 1205   CALCIUM 8.0 (L) 11/10/2019 0323   GFRNONAA >60 11/10/2019 0323   GFRNONAA 63 10/28/2019 1205   GFRAA >60 11/10/2019 0323   GFRAA 73 10/28/2019 1205    INR    Component Value Date/Time   INR 1.2 11/05/2019 1203     Intake/Output Summary (Last 24 hours) at 11/10/2019 0740 Last data filed at 11/09/2019 1800 Gross per 24 hour  Intake 1365.11 ml  Output 1000 ml  Net 365.11 ml     Assessment/Plan:  86 y.o. male with L calcaneous osteomyelitis  No sign of healing L heel wound since endovascular intervention of L ATA 08/19/19 by [name] Continue broad spectrum IV antibiotics per ID for pericardial effusion and L heel osteomyelitis Patient to discuss L BKA with daughter prior to consenting   Anthony Ligas, PA-C [facility] [phone] 11/10/2019 7:40 AM  Agree with above. Discussed with pt option of BKA vs long term antibiotics. He is leaning toward BKA but wanted me to call daughter.  I spoke with her today by phone.  She is going to visit with him today and talk more.  Will see again tomorrow  Anthony Hinds, MD [facility] [place] Office: [phone]

## 2019-11-10 NOTE — Progress Notes (Addendum)
Progress Note  Patient Name: Anthony Dunlap Date of Encounter: 11/10/2019  Primary Cardiologist: Carlyle Dolly, MD   Subjective   Initially said he wanted to go home, when asked about his leg, started crying and wanted to talk to his daughter.  She was called.  I spent 30" with patient and daughter in the room reviewing options and answering questions to the best of my ability.  Inpatient Medications    Scheduled Meds: . sodium chloride   Intravenous Once  . sodium chloride   Intravenous Once  . amitriptyline  20 mg Oral QHS  . aspirin EC  81 mg Oral Q breakfast  . Chlorhexidine Gluconate Cloth  6 each Topical Daily  . collagenase   Topical Daily  . diltiazem  60 mg Oral Q6H  . ferrous sulfate  325 mg Oral Q breakfast  . insulin aspart  0-15 Units Subcutaneous TID WC  . insulin aspart  0-5 Units Subcutaneous QHS  . insulin glargine  15 Units Subcutaneous QHS  . levothyroxine  100 mcg Oral Q breakfast  . metoprolol tartrate  25 mg Oral BID  . multivitamin with minerals  1 tablet Oral Daily  . pantoprazole (PROTONIX) IV  40 mg Intravenous QHS  . polyethylene glycol  17 g Oral Daily  . pravastatin  10 mg Oral Q supper  . primidone  150 mg Oral QHS  . tobramycin  2 drop Both Eyes Q4H while awake   Continuous Infusions: . sodium chloride 50 mL/hr at 11/09/19 2344  . ampicillin-sulbactam (UNASYN) IV 3 g (11/10/19 0552)  . DAPTOmycin (CUBICIN)  IV 700 mg (11/09/19 2100)   PRN Meds: acetaminophen **OR** acetaminophen, ALPRAZolam, colchicine, HYDROcodone-acetaminophen, liver oil-zinc oxide   Vital Signs    Vitals:   11/09/19 2106 11/09/19 2348 11/10/19 0448 11/10/19 0555  BP: 121/77 122/85 126/68 126/75  Pulse: (!) 105 74 95 (!) 106  Resp:  20 17 20   Temp:  97.7 F (36.5 C) 98.1 F (36.7 C) 97.8 F (36.6 C)  TempSrc:  Oral Axillary Oral  SpO2:  96% 98% 96%  Weight:   82.8 kg   Height:        Intake/Output Summary (Last 24 hours) at 11/10/2019 0747 Last data  filed at 11/09/2019 1800 Gross per 24 hour  Intake 1365.11 ml  Output 1000 ml  Net 365.11 ml   Filed Weights   11/09/19 0405 11/09/19 1119 11/10/19 0448  Weight: 81.2 kg 81.2 kg 82.8 kg    Telemetry    Afib, rate gradually increased overnight - Personally Reviewed  ECG    No new EKG to review - Personally Reviewed  Physical Exam   GEN: Frail, elderly male, upset but not in physical distress.   Neck: minimal JVD Cardiac: rapid and irregular R&R, soft murmur, no rubs, or gallops.  Respiratory: diminished to auscultation bilaterally with few rales in the bases. GI: Soft, nontender, non-distended  MS: No edema; No new deformity. S/p R BKA, L foot bandaged Neuro:  Nonfocal  Psych: upset affect   Labs    Chemistry Recent Labs  Lab 11/08/19 0802 11/09/19 0306 11/10/19 0323  NA 133* 138 138  K 3.4* 4.3 4.1  CL 107 112* 111  CO2 17* 18* 21*  GLUCOSE 125* 94 93  BUN 11 9 9   CREATININE 0.97 1.02 1.03  CALCIUM 7.9* 8.2* 8.0*  PROT 6.0* 6.2* 6.0*  ALBUMIN 1.9* 2.2* 2.0*  AST 18 18 19   ALT 17 19 17   ALKPHOS  124 131* 122  BILITOT 0.5 0.3 0.5  GFRNONAA >60 >60 >60  GFRAA >60 >60 >60  ANIONGAP 9 8 6      Hematology Recent Labs  Lab 11/06/19 0303 11/06/19 0303 11/06/19 2054 11/07/19 1001 11/08/19 0802  WBC 10.5  --   --  10.3 10.4  RBC 2.74*  --   --  3.22* 3.34*  HGB 6.9*   < > 7.0* 8.3* 8.5*  HCT 22.8*   < > 23.5* 27.0* 27.8*  MCV 83.2  --   --  83.9 83.2  MCH 25.2*  --   --  25.8* 25.4*  MCHC 30.3  --   --  30.7 30.6  RDW 14.6  --   --  15.0 15.3  PLT 606*  --   --  681* 675*   < > = values in this interval not displayed.   Lab Results  Component Value Date   TSH 1.987 11/05/2019    Cardiac EnzymesNo results for input(s): TROPONINI in the last 168 hours. No results for input(s): TROPIPOC in the last 168 hours.   BNPNo results for input(s): BNP, PROBNP in the last 168 hours.   DDimer No results for input(s): DDIMER in the last 168 hours.    Radiology    DG Abd 1 View  Result Date: 11/08/2019 CLINICAL DATA:  Follow-up for abdominal distension EXAM: ABDOMEN - 1 VIEW COMPARISON:  None. FINDINGS: Gaseous distension of the small bowel and colon. Moderate amount of stool throughout the colon. No evidence of pneumoperitoneum, portal venous gas or pneumatosis. No pathologic calcifications along the expected course of the ureters. No acute osseous abnormality. IMPRESSION: Gaseous distension of the small bowel and colon as can be seen with an ileus. Electronically Signed   By: Kathreen Devoid   On: 11/08/2019 15:47   MR FOOT LEFT W WO CONTRAST  Result Date: 11/08/2019 CLINICAL DATA:  Osteomyelitis of the foot. Plain foot soft tissue wound EXAM: MRI OF THE LEFT FOREFOOT WITHOUT AND WITH CONTRAST TECHNIQUE: Multiplanar, multisequence MR imaging of the left foot was performed both before and after administration of intravenous contrast. CONTRAST:  29mL GADAVIST GADOBUTROL 1 MMOL/ML IV SOLN COMPARISON:  None. FINDINGS: TENDONS Peroneal: Peroneal longus tendon intact. Peroneal brevis intact. Posteromedial: Posterior tibial tendon intact. Flexor hallucis longus tendon intact. Flexor digitorum longus tendon intact. Anterior: Tibialis anterior tendon intact. Extensor hallucis longus tendon intact Extensor digitorum longus tendon intact. Achilles:  Mild tendinosis of the distal Achilles tendon. Plantar Fascia: Thickening of the medial band of the plantar fascia at the calcaneal insertion as can be seen with mild plantar fasciitis. LIGAMENTS Lateral: Anterior talofibular ligament intact. Calcaneofibular ligament intact. Posterior talofibular ligament intact. Anterior and posterior tibiofibular ligaments intact. Medial: Deltoid ligament intact. Spring ligament intact. CARTILAGE Ankle Joint: No joint effusion. Normal ankle mortise. No chondral defect. Subtalar Joints/Sinus Tarsi: Normal subtalar joints. No subtalar joint effusion. Normal sinus tarsi. Bones/Soft  Tissue: Soft tissue wound along the posterolateral aspect of the hindfoot overlying the posterior calcaneus. Underlying cortical destruction of the posterolateral calcaneus just inferior to the Achilles insertion with severe surrounding marrow edema most consistent with osteomyelitis. No drainable fluid collection. Soft tissue edema throughout the hindfoot extending into the ankle concerning for cellulitis. T2 hyperintense signal throughout the plantar musculature likely neurogenic. IMPRESSION: 1. Soft tissue wound along the posterolateral aspect of the hindfoot overlying the posterior calcaneus. Underlying cortical destruction of the posterolateral calcaneus just inferior to the Achilles insertion with severe surrounding marrow edema most consistent  with osteomyelitis with surrounding cellulitis. No drainable fluid collection. 2. Mild tendinosis of the distal Achilles tendon. 3. Thickening of the medial band of the plantar fascia at the calcaneal insertion as can be seen with mild plantar fasciitis. Electronically Signed   By: Kathreen Devoid   On: 11/08/2019 11:46    Cardiac Studies   2D echo 11/06/2019 IMPRESSIONS  1. Left ventricular ejection fraction, by estimation, is 60 to 65%. The  left ventricle has normal function. Left ventricular endocardial border  not optimally defined to evaluate regional wall motion. There is mild left  ventricular hypertrophy. Left  ventricular diastolic parameters are indeterminate.  2. Right ventricular systolic function is normal. The right ventricular  size is normal.  3. Left atrial size was mildly dilated.  4. The pericardial effusion is circumferential.  5. The mitral valve is normal in structure. Trivial mitral valve  regurgitation. No evidence of mitral stenosis.  6. The aortic valve was not well visualized. Aortic valve regurgitation  is not visualized. No aortic stenosis is present.   EGD: 11/09/2019 Findings:      The examined esophagus was  normal.      The entire examined stomach was normal.      The duodenal bulb, first portion of the duodenum and second portion of the duodenum were normal. Impression:               - Normal esophagus.                           - Normal stomach.                           - Normal duodenal bulb, first portion of the                            duodenum and second portion of the duodenum.  Patient Profile     86 y.o. male with a hx of DM, HLD, prostate CA, PAD s/p L tibial PTA 08/19/2019 w/ subsequent R-BKA, pelvic/right hip osteomyelitis on chronic antibiotic suppressive therapy, who was admitted 09/16 with sepsis, cards saw 09/18 for the evaluation of Afib, RVR at the request of Dr Waldron Labs.  Assessment & Plan    1.  Atrial fibrillation with RVR  - no doses missed of Cardizem 60 mg q 6 hr >> CD 240 mg qd unless we should continue short-acting to get more doses in the peri-op period - cont metop 25 mg bid - with elevated HR/BP will give am metop early (had Cardizem at 6 am) and increase BB dose to 25 mg q 8 hr  2.  Pericardial effusion - small by echo, no further workup at this time  3.  Anemia - transfused for Hgb 6.9 - no anticoag or ASA/Plavix 2nd anemia, black stools - EGD 09/20 was normal, does not seem like colonoscopy is planned - no stool guaiac this admit - per IM  4.  Sepsis, osteomyelitis L heel - Dr Oneida Alar has seen, rec BKA - discussed this w/ pt and daughter, pt wife died not long ago in Hospice and dtr does not think she can take this happening again.  - Advised them that Dr Oneida Alar does not think the circulation to pt foot is good enough to allow wound healing. - 30" spent w/ pt/dtr discussing the situation - suggested she come in  and speak w/ attending - ABX per IM/ID  For questions or updates, please contact [place] Please consult [email or web address] for contact info under Cardiology/STEMI.      Signed, Rosaria Ferries, PA-C  11/10/2019, 7:47 AM

## 2019-11-10 NOTE — Care Management Important Message (Signed)
Important Message  Patient Details  Name: MEHKAI GALLO MRN: [id number] Date of Birth: [birth year]   Medicare Important Message Given:  Yes     Shelda Altes 11/10/2019, 12:24 PM

## 2019-11-10 NOTE — Progress Notes (Signed)
Physical Therapy Treatment Patient Details Name: ERCELL RAZON MRN: [id number] DOB: 11/16/1933 Today's Date: 11/10/2019    History of Present Illness Pt is an 86 y.o. male admitted from SNF on 11/05/19 with sepsis, nonhealing L foot ulcer, afib with RVR. Imaging showed L calcenuous osteomyelitis; potential for L BKA. 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   PT Comments    Pt progressing with mobility, motivated to participate. Able to perform scooting transfer to recliner, requiring modA for limited mobility. Pt limited by generalized weakness, decreased activity tolerance, impaired balance, and decreased ability to WB through LLE. Pt declining recommendation for return to SNF for rehab; plans to d/c home with family support. Recommend w/c for home and community use.   Follow Up Recommendations  SNF;Supervision/Assistance - 24 hour (declined SNF, will need max HH services)     Equipment Recommendations  Wheelchair (measurements PT);Wheelchair cushion (measurements PT)    Recommendations for Other Services       Precautions / Restrictions Precautions Precautions: Fall Restrictions Weight Bearing Restrictions:  (attempted to keep LLE with minimal WB through heel secondary to chronic ulcer and need for L BKA)    Mobility  Bed Mobility Overal bed mobility: Needs Assistance Bed Mobility: Supine to Sit;Rolling Rolling: Modified independent (Device/Increase time)   Supine to sit: Mod assist;HOB elevated     General bed mobility comments: Mod indep to roll R/L with use of bed rail, dependent for pericare/linen change due to bowel/bladder incontinence. ModA for trunk elevation sitting at EOB  Transfers Overall transfer level: Needs assistance   Transfers: Lateral/Scoot Transfers          Lateral/Scoot Transfers: Mod assist General transfer comment: Patient was not interested in attempting AP transfer, agreeable to  try lateral scoot (reports "I use a sliding board at home"), able to complete with modA to block R hip from scooting of edge of chair, heavy reliance on BUE support with minimal WB through LLE; frequent cues for sequencing; pt moving quickly requiring cues to rest and reset  Ambulation/Gait                 Stairs             Wheelchair Mobility    Modified Rankin (Stroke Patients Only)       Balance Overall balance assessment: Needs assistance Sitting-balance support: Feet supported;Bilateral upper extremity supported Sitting balance-Leahy Scale: Poor Sitting balance - Comments: Reliant on UE support to maintain static sitting                                    Cognition Arousal/Alertness: Awake/alert Behavior During Therapy: WFL for tasks assessed/performed Overall Cognitive Status: No family/caregiver present to determine baseline cognitive functioning Area of Impairment: Attention;Following commands;Problem solving;Awareness                   Current Attention Level: Selective   Following Commands: Follows multi-step commands inconsistently;Follows one step commands consistently   Awareness: Emergent Problem Solving: Requires verbal cues        Exercises      General Comments        Pertinent Vitals/Pain Pain Assessment: No/denies pain    Home Living                      Prior Function  PT Goals (current goals can now be found in the care plan section) Acute Rehab PT Goals Patient Stated Goal: "I'm not going back to rehab, I'm going home" PT Goal Formulation: With patient Time For Goal Achievement: 11/19/19 Potential to Achieve Goals: Fair Progress towards PT goals: Progressing toward goals    Frequency    Min 3X/week      PT Plan Current plan remains appropriate    Co-evaluation              AM-PAC PT "6 Clicks" Mobility   Outcome Measure  Help needed turning from your back to  your side while in a flat bed without using bedrails?: A Little Help needed moving from lying on your back to sitting on the side of a flat bed without using bedrails?: A Lot Help needed moving to and from a bed to a chair (including a wheelchair)?: A Lot Help needed standing up from a chair using your arms (e.g., wheelchair or bedside chair)?: Total Help needed to walk in hospital room?: Total Help needed climbing 3-5 steps with a railing? : Total 6 Click Score: 10    End of Session   Activity Tolerance: Patient tolerated treatment well Patient left: in chair;with call bell/phone within reach;with chair alarm set Nurse Communication: Mobility status PT Visit Diagnosis: Other abnormalities of gait and mobility (R26.89);Difficulty in walking, not elsewhere classified (R26.2);Muscle weakness (generalized) (M62.81)     Time: 8768-1157 PT Time Calculation (min) (ACUTE ONLY): 33 min  Charges:  $Therapeutic Activity: 23-37 mins                     Mabeline Caras, PT, DPT Acute Rehabilitation Services  Pager [phone] Office [phone]  Derry Lory 11/10/2019, 12:28 PM

## 2019-11-11 ENCOUNTER — Inpatient Hospital Stay (HOSPITAL_COMMUNITY): Payer: Medicare Other | Admitting: Certified Registered"

## 2019-11-11 ENCOUNTER — Encounter (HOSPITAL_COMMUNITY)
Admission: EM | Disposition: A | Discharge status: Home Health | Payer: Self-pay | Source: Home / Self Care | Attending: Internal Medicine

## 2019-11-11 ENCOUNTER — Encounter (HOSPITAL_COMMUNITY): Payer: Self-pay | Admitting: Gastroenterology

## 2019-11-11 DIAGNOSIS — I70262 Atherosclerosis of native arteries of extremities with gangrene, left leg: Secondary | ICD-10-CM

## 2019-11-11 DIAGNOSIS — M86172 Other acute osteomyelitis, left ankle and foot: Secondary | ICD-10-CM

## 2019-11-11 HISTORY — PX: AMPUTATION: SHX166

## 2019-11-11 LAB — CBC
HCT: 24.7 % — ABNORMAL LOW (ref 39.0–52.0)
Hemoglobin: 7.4 g/dL — ABNORMAL LOW (ref 13.0–17.0)
MCH: 25.6 pg — ABNORMAL LOW (ref 26.0–34.0)
MCHC: 30 g/dL (ref 30.0–36.0)
MCV: 85.5 fL (ref 80.0–100.0)
Platelets: 568 10*3/uL — ABNORMAL HIGH (ref 150–400)
RBC: 2.89 MIL/uL — ABNORMAL LOW (ref 4.22–5.81)
RDW: 16.4 % — ABNORMAL HIGH (ref 11.5–15.5)
WBC: 13.2 10*3/uL — ABNORMAL HIGH (ref 4.0–10.5)
nRBC: 0 % (ref 0.0–0.2)

## 2019-11-11 LAB — GLUCOSE, CAPILLARY
Glucose-Capillary: 103 mg/dL — ABNORMAL HIGH (ref 70–99)
Glucose-Capillary: 114 mg/dL — ABNORMAL HIGH (ref 70–99)
Glucose-Capillary: 114 mg/dL — ABNORMAL HIGH (ref 70–99)
Glucose-Capillary: 140 mg/dL — ABNORMAL HIGH (ref 70–99)
Glucose-Capillary: 157 mg/dL — ABNORMAL HIGH (ref 70–99)
Glucose-Capillary: 157 mg/dL — ABNORMAL HIGH (ref 70–99)
Glucose-Capillary: 184 mg/dL — ABNORMAL HIGH (ref 70–99)

## 2019-11-11 LAB — COMPREHENSIVE METABOLIC PANEL
ALT: 14 U/L (ref 0–44)
AST: 16 U/L (ref 15–41)
Albumin: 1.9 g/dL — ABNORMAL LOW (ref 3.5–5.0)
Alkaline Phosphatase: 115 U/L (ref 38–126)
Anion gap: 8 (ref 5–15)
BUN: 9 mg/dL (ref 8–23)
CO2: 21 mmol/L — ABNORMAL LOW (ref 22–32)
Calcium: 7.7 mg/dL — ABNORMAL LOW (ref 8.9–10.3)
Chloride: 108 mmol/L (ref 98–111)
Creatinine, Ser: 0.9 mg/dL (ref 0.61–1.24)
GFR calc Af Amer: >60 mL/min (ref >60)
GFR calc non Af Amer: >60 mL/min (ref >60)
Glucose, Bld: 222 mg/dL — ABNORMAL HIGH (ref 70–99)
Potassium: 4.5 mmol/L (ref 3.5–5.1)
Sodium: 137 mmol/L (ref 135–145)
Total Bilirubin: 0.4 mg/dL (ref 0.3–1.2)
Total Protein: 5.2 g/dL — ABNORMAL LOW (ref 6.5–8.1)

## 2019-11-11 SURGERY — AMPUTATION BELOW KNEE
Anesthesia: General | Site: Knee | Laterality: Left

## 2019-11-11 MED ORDER — DOCUSATE SODIUM 100 MG PO CAPS
100.0000 mg | ORAL_CAPSULE | Freq: Every day | ORAL | Status: DC
  Administered 2019-11-11 – 2019-11-17 (×7): 100 mg via ORAL
  Filled 2019-11-11 (×8): qty 1

## 2019-11-11 MED ORDER — 0.9 % SODIUM CHLORIDE (POUR BTL) OPTIME
TOPICAL | Status: DC | PRN
  Administered 2019-11-11: 1000 mL

## 2019-11-11 MED ORDER — PHENYLEPHRINE HCL-NACL 10-0.9 MG/250ML-% IV SOLN
INTRAVENOUS | Status: DC | PRN
  Administered 2019-11-11: 25 ug/min via INTRAVENOUS

## 2019-11-11 MED ORDER — LABETALOL HCL 5 MG/ML IV SOLN: 10.0000 mg | INTRAVENOUS | Status: DC | PRN

## 2019-11-11 MED ORDER — PROPOFOL 10 MG/ML IV BOLUS
INTRAVENOUS | Status: DC | PRN
  Administered 2019-11-11: 90 mg via INTRAVENOUS

## 2019-11-11 MED ORDER — ALUM & MAG HYDROXIDE-SIMETH 200-200-20 MG/5ML PO SUSP: 15.0000 mL | ORAL | Status: DC | PRN

## 2019-11-11 MED ORDER — CHLORHEXIDINE GLUCONATE 0.12 % MT SOLN
OROMUCOSAL | Status: AC
  Filled 2019-11-11: qty 15

## 2019-11-11 MED ORDER — HYDRALAZINE HCL 20 MG/ML IJ SOLN: 5.0000 mg | INTRAMUSCULAR | Status: DC | PRN

## 2019-11-11 MED ORDER — CEFAZOLIN SODIUM-DEXTROSE 2-3 GM-%(50ML) IV SOLR
INTRAVENOUS | Status: DC | PRN
  Administered 2019-11-11: 2 g via INTRAVENOUS

## 2019-11-11 MED ORDER — OXYBUTYNIN CHLORIDE ER 5 MG PO TB24
5.0000 mg | ORAL_TABLET | Freq: Every day | ORAL | Status: DC
  Administered 2019-11-11 – 2019-11-25 (×15): 5 mg via ORAL
  Filled 2019-11-11 (×16): qty 1

## 2019-11-11 MED ORDER — FENTANYL CITRATE (PF) 100 MCG/2ML IJ SOLN: 25.0000 ug | INTRAMUSCULAR | Status: DC | PRN

## 2019-11-11 MED ORDER — OXYCODONE HCL 5 MG/5ML PO SOLN: 5.0000 mg | Freq: Once | ORAL | Status: DC | PRN

## 2019-11-11 MED ORDER — CHLORHEXIDINE GLUCONATE 0.12 % MT SOLN: 15.0000 mL | Freq: Once | OROMUCOSAL | Status: AC

## 2019-11-11 MED ORDER — MORPHINE SULFATE (PF) 2 MG/ML IV SOLN: 2.0000 mg | INTRAVENOUS | Status: DC | PRN

## 2019-11-11 MED ORDER — FENTANYL CITRATE (PF) 100 MCG/2ML IJ SOLN
INTRAMUSCULAR | Status: AC
  Administered 2019-11-11: 50 ug
  Filled 2019-11-11: qty 2

## 2019-11-11 MED ORDER — ONDANSETRON HCL 4 MG/2ML IJ SOLN: 4.0000 mg | Freq: Once | INTRAMUSCULAR | Status: DC | PRN

## 2019-11-11 MED ORDER — GUAIFENESIN-DM 100-10 MG/5ML PO SYRP: 15.0000 mL | ORAL_SOLUTION | ORAL | Status: DC | PRN

## 2019-11-11 MED ORDER — METOPROLOL TARTRATE 5 MG/5ML IV SOLN: 2.0000 mg | INTRAVENOUS | Status: DC | PRN

## 2019-11-11 MED ORDER — POTASSIUM CHLORIDE CRYS ER 20 MEQ PO TBCR: 20.0000 meq | EXTENDED_RELEASE_TABLET | Freq: Every day | ORAL | Status: DC | PRN

## 2019-11-11 MED ORDER — OXYCODONE HCL 5 MG PO TABS: 5.0000 mg | ORAL_TABLET | Freq: Once | ORAL | Status: DC | PRN

## 2019-11-11 MED ORDER — HEPARIN SODIUM (PORCINE) 5000 UNIT/ML IJ SOLN
5000.0000 [IU] | Freq: Three times a day (TID) | INTRAMUSCULAR | Status: DC
  Administered 2019-11-12 – 2019-11-15 (×11): 5000 [IU] via SUBCUTANEOUS
  Filled 2019-11-11 (×10): qty 1

## 2019-11-11 MED ORDER — CEFAZOLIN SODIUM-DEXTROSE 2-4 GM/100ML-% IV SOLN: 2.0000 g | Freq: Three times a day (TID) | INTRAVENOUS | Status: DC

## 2019-11-11 MED ORDER — BUPIVACAINE-EPINEPHRINE (PF) 0.5% -1:200000 IJ SOLN
INTRAMUSCULAR | Status: DC | PRN
  Administered 2019-11-11: 25 mL via PERINEURAL
  Administered 2019-11-11: 15 mL via PERINEURAL

## 2019-11-11 MED ORDER — MAGNESIUM SULFATE 2 GM/50ML IV SOLN: 2.0000 g | Freq: Every day | INTRAVENOUS | Status: DC | PRN

## 2019-11-11 MED ORDER — ONDANSETRON HCL 4 MG/2ML IJ SOLN
INTRAMUSCULAR | Status: DC | PRN
  Administered 2019-11-11: 4 mg via INTRAVENOUS

## 2019-11-11 MED ORDER — FENTANYL CITRATE (PF) 250 MCG/5ML IJ SOLN
INTRAMUSCULAR | Status: AC
  Filled 2019-11-11: qty 5

## 2019-11-11 MED ORDER — SODIUM CHLORIDE 0.9 % IV SOLN: INTRAVENOUS | Status: DC

## 2019-11-11 MED ORDER — LIDOCAINE 2% (20 MG/ML) 5 ML SYRINGE
INTRAMUSCULAR | Status: DC | PRN
  Administered 2019-11-11: 60 mg via INTRAVENOUS

## 2019-11-11 MED ORDER — ONDANSETRON HCL 4 MG/2ML IJ SOLN: 4.0000 mg | Freq: Four times a day (QID) | INTRAMUSCULAR | Status: DC | PRN

## 2019-11-11 MED ORDER — ORAL CARE MOUTH RINSE: 15.0000 mL | Freq: Once | OROMUCOSAL | Status: AC

## 2019-11-11 MED ORDER — OXYCODONE-ACETAMINOPHEN 5-325 MG PO TABS
1.0000 | ORAL_TABLET | ORAL | Status: DC | PRN
  Administered 2019-11-12: 1 via ORAL
  Filled 2019-11-11: qty 1

## 2019-11-11 MED ORDER — FENTANYL CITRATE (PF) 100 MCG/2ML IJ SOLN: 50.0000 ug | Freq: Once | INTRAMUSCULAR | Status: AC

## 2019-11-11 MED ORDER — LACTATED RINGERS IV SOLN: INTRAVENOUS | Status: DC

## 2019-11-11 MED ORDER — FENTANYL CITRATE (PF) 100 MCG/2ML IJ SOLN
INTRAMUSCULAR | Status: DC | PRN
Start: 2019-11-11 — End: 2019-11-11
  Administered 2019-11-11: 50 ug via INTRAVENOUS

## 2019-11-11 MED ORDER — PHENOL 1.4 % MT LIQD: 1.0000 | OROMUCOSAL | Status: DC | PRN

## 2019-11-11 SURGICAL SUPPLY — 45 items
BANDAGE ESMARK 6X9 LF (GAUZE/BANDAGES/DRESSINGS) IMPLANT
BLADE SAW GIGLI 510 (BLADE) ×2 IMPLANT
BLADE SAW GIGLI 510MM (BLADE) ×1
BNDG CMPR 9X6 STRL LF SNTH (GAUZE/BANDAGES/DRESSINGS)
BNDG ELASTIC 4X5.8 VLCR STR LF (GAUZE/BANDAGES/DRESSINGS) ×3 IMPLANT
BNDG ELASTIC 6X5.8 VLCR STR LF (GAUZE/BANDAGES/DRESSINGS) IMPLANT
BNDG ESMARK 6X9 LF (GAUZE/BANDAGES/DRESSINGS)
BNDG GAUZE ELAST 4 BULKY (GAUZE/BANDAGES/DRESSINGS) ×2 IMPLANT
CANISTER SUCT 3000ML PPV (MISCELLANEOUS) ×3 IMPLANT
CLIP LIGATING EXTRA MED SLVR (CLIP) ×3 IMPLANT
CLIP LIGATING EXTRA SM BLUE (MISCELLANEOUS) ×3 IMPLANT
COVER SURGICAL LIGHT HANDLE (MISCELLANEOUS) ×4 IMPLANT
COVER WAND RF STERILE (DRAPES) ×3 IMPLANT
CUFF TOURN SGL QUICK 34 (TOURNIQUET CUFF)
CUFF TOURN SGL QUICK 42 (TOURNIQUET CUFF) IMPLANT
CUFF TRNQT CYL 34X4.125X (TOURNIQUET CUFF) IMPLANT
DRAIN SNY 10X20 3/4 PERF (WOUND CARE) IMPLANT
DRAPE HALF SHEET 40X57 (DRAPES) ×3 IMPLANT
DRAPE ORTHO SPLIT 77X108 STRL (DRAPES) ×6
DRAPE SURG ORHT 6 SPLT 77X108 (DRAPES) ×2 IMPLANT
DRSG ADAPTIC 3X8 NADH LF (GAUZE/BANDAGES/DRESSINGS) ×2 IMPLANT
ELECT REM PT RETURN 9FT ADLT (ELECTROSURGICAL) ×3
ELECTRODE REM PT RTRN 9FT ADLT (ELECTROSURGICAL) ×1 IMPLANT
EVACUATOR SILICONE 100CC (DRAIN) IMPLANT
GAUZE SPONGE 4X4 12PLY STRL (GAUZE/BANDAGES/DRESSINGS) ×3 IMPLANT
GAUZE XEROFORM 5X9 LF (GAUZE/BANDAGES/DRESSINGS) ×1 IMPLANT
GLOVE SS BIOGEL STRL SZ 7.5 (GLOVE) ×1 IMPLANT
GLOVE SUPERSENSE BIOGEL SZ 7.5 (GLOVE) ×2
GOWN STRL REUS W/ TWL LRG LVL3 (GOWN DISPOSABLE) ×3 IMPLANT
GOWN STRL REUS W/TWL LRG LVL3 (GOWN DISPOSABLE) ×12
KIT BASIN OR (CUSTOM PROCEDURE TRAY) ×3 IMPLANT
KIT TURNOVER KIT B (KITS) ×3 IMPLANT
NS IRRIG 1000ML POUR BTL (IV SOLUTION) ×3 IMPLANT
PACK GENERAL/GYN (CUSTOM PROCEDURE TRAY) ×3 IMPLANT
PAD ARMBOARD 7.5X6 YLW CONV (MISCELLANEOUS) ×6 IMPLANT
PADDING CAST COTTON 6X4 STRL (CAST SUPPLIES) IMPLANT
STAPLER VISISTAT 35W (STAPLE) ×3 IMPLANT
STOCKINETTE IMPERVIOUS LG (DRAPES) ×3 IMPLANT
SUT ETHILON 3 0 PS 1 (SUTURE) IMPLANT
SUT VIC AB 0 CT1 18XCR BRD 8 (SUTURE) ×2 IMPLANT
SUT VIC AB 0 CT1 8-18 (SUTURE) ×6
SUT VICRYL AB 2 0 TIES (SUTURE) ×3 IMPLANT
TOWEL GREEN STERILE (TOWEL DISPOSABLE) ×6 IMPLANT
UNDERPAD 30X36 HEAVY ABSORB (UNDERPADS AND DIAPERS) ×3 IMPLANT
WATER STERILE IRR 1000ML POUR (IV SOLUTION) ×3 IMPLANT

## 2019-11-11 NOTE — Progress Notes (Signed)
PT Cancellation Note  Patient Details Name: JARQUAVIOUS FENTRESS MRN: [id number] DOB: Apr 28, 1933   Cancelled Treatment:    Reason Eval/Treat Not Completed: (P) Patient at procedure or test/unavailable Pt is being taken off floor for L BKA. PT will follow back tomorrow for treatment.   Terena Bohan B. Migdalia Dk PT, DPT Acute Rehabilitation Services Pager [phone] Office [phone]    [place] 11/11/2019, 10:15 AM

## 2019-11-11 NOTE — H&P (View-Only) (Signed)
  Progress Note    11/11/2019 7:47 AM 2 Days Post-Op  Subjective:  Patient had a discussion with his daughter and he is willing to proceed with amputation   Vitals:   11/11/19 0001 11/11/19 0332  BP: 120/84 113/77  Pulse: 79 96  Resp: (!) 23 (!) 21  Temp: 98 F (36.7 C) (!) 97.5 F (36.4 C)  SpO2: 98% 98%   Physical Exam: Lungs:  Non labored Incisions:  R BKA dry eschar Extremities:  L heel ulcer with dressing in place Neurologic: A&O  CBC    Component Value Date/Time   WBC 10.4 11/08/2019 0802   RBC 3.34 (L) 11/08/2019 0802   HGB 8.5 (L) 11/08/2019 0802   HCT 27.8 (L) 11/08/2019 0802   PLT 675 (H) 11/08/2019 0802   MCV 83.2 11/08/2019 0802   MCH 25.4 (L) 11/08/2019 0802   MCHC 30.6 11/08/2019 0802   RDW 15.3 11/08/2019 0802   LYMPHSABS 2.0 11/08/2019 0802   MONOABS 0.9 11/08/2019 0802   EOSABS 0.7 (H) 11/08/2019 0802   BASOSABS 0.1 11/08/2019 0802    BMET    Component Value Date/Time   NA 138 11/10/2019 0323   K 4.1 11/10/2019 0323   CL 111 11/10/2019 0323   CO2 21 (L) 11/10/2019 0323   GLUCOSE 93 11/10/2019 0323   BUN 9 11/10/2019 0323   CREATININE 1.03 11/10/2019 0323   CREATININE 1.07 10/28/2019 1205   CALCIUM 8.0 (L) 11/10/2019 0323   GFRNONAA >60 11/10/2019 0323   GFRNONAA 63 10/28/2019 1205   GFRAA >60 11/10/2019 0323   GFRAA 73 10/28/2019 1205    INR    Component Value Date/Time   INR 1.2 11/05/2019 1203     Intake/Output Summary (Last 24 hours) at 11/11/2019 0747 Last data filed at 11/11/2019 0407 Gross per 24 hour  Intake 1515.28 ml  Output 1250 ml  Net 265.28 ml     Assessment/Plan:  86 y.o. male with osteomyelitis L heel 2 Days Post-Op   After discussion with his daughter, he is willing to proceed with L BKA We will keep him NPO this morning and check OR availability for today Consent   Dagoberto Ligas, PA-C [facility] [phone] 11/11/2019 7:47 AM  Agree with above.  Pt for BKA will work on  scheduling  Ruta Hinds, MD [facility] [place] Office: [phone]  Dr Early to do left BKA today.  D/w daughter who would like to be called post procedure.  Ruta Hinds, MD [facility] [place] Office: [phone]

## 2019-11-11 NOTE — Progress Notes (Signed)
Pharmacy note: ancef  Patient ordered ancef 2gm IV q8 x 2 dose for post surgical prophylaxis and currently receiving Unasyn and daptomycin.    Plan -Unasyn will provide adequate coverage for surgical prophylaxis -Will discontinue ancef  Please call pharmacy with any concerns ([phone])  Hildred Laser, PharmD Clinical Pharmacist **Pharmacist phone directory can now be found on [email or web address] ([email or web address]).  Listed under [place].

## 2019-11-11 NOTE — Procedures (Signed)
Bedside cystoscopy Note  Preprocedure diagnosis:  1.  Catheter malfunction  Postoperative diagnosis: 1.  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  Flexible cystoscopy 2. Difficult catheter placement over a wire  Surgeon: Rexene Alberts, MD  Assistants:  None  Anesthesia:  None  Complications:  None  EBL:  none  Specimens: 1. none  Drains/Catheters: 1.  18 Fr council catheter  Intraoperative findings:   1. Normal anterior urethra without strictures. Incompetent sphincter with erosion from chronic indwelling foley catheter. Wide open bladder neck. No bladder lesions. Posterior wall inflammation from chronic catheter.  Indication:  Anthony Dunlap is a 86 y.o. male with history of prostate cancer treated with brachytherapy in 2009 and chronic urinary retention treated with a chronic indwelling Foley catheter since 2019.  After thorough discussion including all relevant risk benefits and alternatives, he consents to the above procedure.  Description of procedure: After the patient was prepped and draped in the usual sterile fashion, flexible cystourethroscopy was performed.  This revealed a normal anterior urethra.  He did have an incompetent sphincter likely due to erosion from his chronic indwelling Foley catheter.  His prostate appear to be atrophic given history of radiation therapy.  There were no strictures or stenoses present.  He had a wide open bladder neck opening up to the trigone.  His bilateral ureteral orifice ease were in their normal orthotopic position with the looks of clear urine.  He had some posterior wall inflammation given history of Foley catheter.  Systematic examination of the bladder revealed no evidence for tumors, stones, or other mucosal pathology.  Plan: Leave Foley catheter in place for now.  Discussed option of CIC or SP tube.  Patient desires indwelling Foley for now.  We will have patient  follow-up with Dr. Jeffie Pollock in the office to further discuss and to review PSA from history of prostate cancer treated with brachytherapy.  [name]  Pager: [phone]

## 2019-11-11 NOTE — Anesthesia Preprocedure Evaluation (Addendum)
Anesthesia Evaluation  Patient identified by MRN, date of birth, ID band Patient awake    Reviewed: Allergy & Precautions, NPO status , Patient's Chart, lab work & pertinent test results  History of Anesthesia Complications Negative for: history of anesthetic complications  Airway Mallampati: II  TM Distance: >3 FB Neck ROM: Full    Dental  (+) Dental Advisory Given   Pulmonary neg pulmonary ROS,    Pulmonary exam normal        Cardiovascular + Peripheral Vascular Disease  Normal cardiovascular exam+ dysrhythmias Atrial Fibrillation    '21 TTE - EF 60 to 65%. Mild LVH. LA was mildly dilated. The pericardial effusion is circumferential. Trivial MR.    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  negative psych ROS   GI/Hepatic negative GI ROS, (+) Hepatitis - (drug-induced)  Endo/Other  diabetes, Type 2, Insulin Dependent, Oral Hypoglycemic AgentsHypothyroidism   Renal/GU negative Renal ROS    Prostate cancer     Musculoskeletal  (+) Arthritis ,   Abdominal   Peds  Hematology  (+) anemia ,  On plavix Plt 675k    Anesthesia Other Findings Covid test negative   Reproductive/Obstetrics                            Anesthesia Physical Anesthesia Plan  ASA: III  Anesthesia Plan: General   Post-op Pain Management:  Regional for Post-op pain   Induction: Intravenous  PONV Risk Score and Plan: 2 and Treatment may vary due to age or medical condition and Ondansetron  Airway Management Planned: LMA  Additional Equipment: None  Intra-op Plan:   Post-operative Plan: Extubation in OR  Informed Consent: I have reviewed the patients History and Physical, chart, labs and discussed the procedure including the risks, benefits and alternatives for the proposed anesthesia with the patient or authorized representative who has indicated his/her understanding and acceptance.     Dental  advisory given  Plan Discussed with: CRNA and Anesthesiologist  Anesthesia Plan Comments:        Anesthesia Quick Evaluation

## 2019-11-11 NOTE — Interval H&P Note (Signed)
History and Physical Interval Note:  11/11/2019 10:44 AM  [name]  has presented today for surgery, with the diagnosis of Osteomylitis left foot..  The various methods of treatment have been discussed with the patient and family. After consideration of risks, benefits and other options for treatment, the patient has consented to  Procedure(s): AMPUTATION BELOW KNEE (Left) as a surgical intervention.  The patient's history has been reviewed, patient examined, no change in status, stable for surgery.  I have reviewed the patient's chart and labs.  Questions were answered to the patient's satisfaction.     Curt Jews

## 2019-11-11 NOTE — Progress Notes (Signed)
PROGRESS NOTE    Anthony Dunlap  IEP:[id number] DOB: 1933/05/25 DOA: 11/05/2019 PCP: [name], MD   Chief Complaint  Patient presents with   possible sepsis   Brief Narrative: 86 year old with DM2, prostate cancer, severe PVD followed by [name] status post right BKA and history of hip osteomyelitis on chronic suppressive antibiotic therapy was [facility] from SNFfor fever and new onset A. fib with RVR. On admission patient was hemodynamically stable but his hemoglobin was noted to be markedly decreased from last month. History per patient's daughter who is his caregiver noted that he had been having black stool for a week prior to that. Patient was not started on anticoagulation anticoagulation for his atrial fibrillation. Patient was transfused 1 unit PRBC. Patient was seen by cardiology after being placed on a Cardizem drip. Cardizem was discontinued and patient was placed on Lopressor with reasonable control of his heart rate. Patient was seen by [name] who also follows him as an outpatient. He underwent an MRI which revealed osteomyelitis of his left heel. Skin surgery was called to consult regarding blood flow. Patient underwent EGD on 11/09/2019 which was unrevealing. Vascular has advised amputation for left heel wound/osteo and he agreed for surgery on 9/22  Subjective: Seen this morning alert awake.Not in acute distress. HR stable. Afebrile. He has discussed w/ his daughter and now agreed for Amputation Later today if OR is available Has chronic foley in place but has been leaking  Assessment & Plan:  Left heel pressure ulcer with underlying calcaneal osteomyelitis present on admission: Appreciate ID and Dr. Nona Dell inputs- going for amputation today.  Per ID can come off antibiotics postoperatively.  He is on Dapto/Unasyn  Sepsis POA 2/2 #1:as evident by fever, tachypnea, tachycardia and lactic acidosis.  Continue plan as per #1  A. fib with RVR: Heart rate better  controlled after increasing beta-blocker. Not on anticoagulation due to anemia on admission and currently on hold due to anticipated procedure and input noted from cardiology for restarting Eliquis at discharge  Anemia of chronic disease with reported melena S/P EGD 9/20 was normal.  Hemoglobin overall stable. Labs pending. Continue PPI. Per GI okay to restart anticoagulation for atrial fibrillation when warranted currently on hold due to anticipated procedure Recent Labs  Lab 11/05/19 1203 11/06/19 0303 11/06/19 2054 11/07/19 1001 11/08/19 0802  HGB 7.4* 6.9* 7.0* 8.3* 8.5*  HCT 24.4* 22.8* 23.5* 27.0* 27.8*   Pericardial effusion, small: No further work-up at this time per cardiology  Hypokalemia/hypomagnesemia: labs stable, pending today  PVD/Lt YSA:YTKZ calcaneal osteomyelitis  DM2:uncontrolled hba1c at 8.5. sugar stable, keep on ssi and monitor cbg. Recent Labs  Lab 11/10/19 1633 11/10/19 2109 11/11/19 0007 11/11/19 0330 11/11/19 0759  GLUCAP 226* 229* 157* 103* 114*   Lab Results  Component Value Date   HGBA1C 8.5 (H) 11/05/2019   Anxiety/Depression: Mood is stable continue home Amitryptaline, primodine.  Hypothyroidism:Continue home Synthroid.    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 upsizing from 16 to 18 may not help it, if persistent he can see him tomorrow, and should f/u with hh and uro as outpatient.  DVT prophylaxis: SCDs Start: 11/05/19 2016 not on heparin 2/2 Code Status:   Code Status: Full Code  Family Communication: plan of care discussed with  patient at bedside.  Status is: Inpatient Remains inpatient appropriate because:Inpatient level of care appropriate due to severity of illness  Dispo: The patient is from: Home/SNF               Anticipated d/c is to: Home vs SNF              Anticipated d/c date is: 2 days              Patient currently is not medically stable to d/c. Nutrition: Diet Order            Diet NPO time specified  Diet effective now                Body mass index is 27.75 kg/m. Pressure Ulcer: Pressure Injury 11/09/19 Buttocks Right;Proximal Stage 2 -  Partial thickness loss of dermis presenting as a shallow open injury with a red, pink wound bed without slough. (Active)  11/09/19 1915  Location: Buttocks  Location Orientation: Right;Proximal  Staging: Stage 2 -  Partial thickness loss of dermis presenting as a shallow open injury with a red, pink wound bed without slough.  Wound Description (Comments):   Present on Admission:    Consultants:see note  Procedures:see note Microbiology:see note Blood Culture    Component Value Date/Time   SDES BLOOD 11/05/2019 1212   SPECREQUEST NONE 11/05/2019 1212   CULT  11/05/2019 1212    NO GROWTH 5 DAYS Performed at [facility], [address], [place], [place] [address]    REPTSTATUS 11/10/2019 FINAL 11/05/2019 1212    Other culture-see note  Medications: Scheduled Meds:  sodium chloride   Intravenous Once   sodium chloride   Intravenous Once   amitriptyline  20 mg Oral QHS   aspirin EC  81 mg Oral Q breakfast   Chlorhexidine Gluconate Cloth  6 each Topical Daily   collagenase   Topical Daily   diltiazem  60 mg Oral Q6H   ferrous sulfate  325 mg Oral Q breakfast   insulin aspart  0-15 Units Subcutaneous TID WC   insulin aspart  0-5 Units Subcutaneous QHS   insulin glargine  15 Units Subcutaneous QHS   levothyroxine  100 mcg Oral Q breakfast   metoprolol tartrate  25 mg Oral Q8H   multivitamin with minerals  1 tablet Oral Daily   pantoprazole (PROTONIX) IV  40 mg Intravenous QHS   polyethylene glycol  17 g Oral Daily   pravastatin  10 mg Oral Q supper   primidone  150 mg Oral QHS   tobramycin  2 drop Both Eyes Q4H  while awake   Continuous Infusions:  sodium chloride 50 mL/hr at 11/11/19 0020   ampicillin-sulbactam (UNASYN) IV 3 g (11/11/19 0701)   DAPTOmycin (CUBICIN)  IV 700 mg (11/10/19 2034)    Antimicrobials: Anti-infectives (From admission, onward)   Start     Dose/Rate Route Frequency Ordered Stop   11/07/19 1200  DAPTOmycin (CUBICIN) 700 mg in sodium chloride 0.9 % IVPB        700 mg 228 mL/hr over 30 Minutes Intravenous Daily 11/07/19 1056     11/07/19 1200  Ampicillin-Sulbactam (UNASYN) 3 g in sodium chloride 0.9 % 100 mL IVPB        3 g 200 mL/hr over 30 Minutes Intravenous Every 6 hours 11/07/19 1056     11/06/19 1300  vancomycin (VANCOREADY) IVPB 1250 mg/250 mL  Status:  Discontinued        1,250 mg 166.7  mL/hr over 90 Minutes Intravenous Every 24 hours 11/05/19 1324 11/07/19 1030   11/06/19 0000  ceFEPIme (MAXIPIME) 2 g in sodium chloride 0.9 % 100 mL IVPB  Status:  Discontinued        2 g 200 mL/hr over 30 Minutes Intravenous Every 12 hours 11/05/19 1648 11/07/19 1030   11/05/19 2200  metroNIDAZOLE (FLAGYL) IVPB 500 mg  Status:  Discontinued        500 mg 100 mL/hr over 60 Minutes Intravenous Every 8 hours 11/05/19 1637 11/07/19 1030   11/05/19 1300  vancomycin (VANCOREADY) IVPB 2000 mg/400 mL        2,000 mg 200 mL/hr over 120 Minutes Intravenous  Once 11/05/19 1226 11/05/19 1453   11/05/19 1230  metroNIDAZOLE (FLAGYL) IVPB 500 mg        500 mg 100 mL/hr over 60 Minutes Intravenous  Once 11/05/19 1223 11/05/19 1345   11/05/19 1215  ceFEPIme (MAXIPIME) 2 g in sodium chloride 0.9 % 100 mL IVPB        2 g 200 mL/hr over 30 Minutes Intravenous  Once 11/05/19 1201 11/05/19 1254     Objective: Vitals: Today's Vitals   11/10/19 2039 11/11/19 0001 11/11/19 0332 11/11/19 0802  BP: 117/68 120/84 113/77 104/82  Pulse: 95 79 96 84  Resp: (!) 21 (!) 23 (!) 21 16  Temp: 97.7 F (36.5 C) 98 F (36.7 C) (!) 97.5 F (36.4 C) 98.7 F (37.1 C)  TempSrc: Oral Oral Oral Oral   SpO2: 96% 98% 98%   Weight:      Height:      PainSc: 0-No pain       Intake/Output Summary (Last 24 hours) at 11/11/2019 0833 Last data filed at 11/11/2019 0407 Gross per 24 hour  Intake 1515.28 ml  Output 1250 ml  Net 265.28 ml   Filed Weights   11/09/19 0405 11/09/19 1119 11/10/19 0448  Weight: 81.2 kg 81.2 kg 82.8 kg   Weight change:   Intake/Output from previous day: 09/21 0701 - 09/22 0700 In: 1515.3 [P.O.:360; I.V.:838; IV Piggyback:317.3] Out: 1250 [Urine:1250] Intake/Output this shift: No intake/output data recorded.  Examination: General exam: AAO at baseline, NAD, weak appearing. HEENT:Oral mucosa moist, Ear/Nose WNL grossly, dentition normal. Respiratory system: bilaterally clear,no wheezing or crackles,no use of accessory muscle Cardiovascular system: S1 & S2 +, No JVD,. Gastrointestinal system: Abdomen soft, NT,ND, BS+ Nervous System:Alert, awake, moving extremities and grossly nonfocal Extremities: No edema, distal peripheral pulses palpable.  Left foot with open wound dressing intact see the picture from yesterday. Skin: No rashes,no icterus. MSK: Normal muscle bulk,tone, power     Data Reviewed: I have personally reviewed following labs and imaging studies CBC: Recent Labs  Lab 11/05/19 1203 11/06/19 0303 11/06/19 2054 11/07/19 1001 11/08/19 0802  WBC 11.0* 10.5  --  10.3 10.4  NEUTROABS 8.6*  --   --  7.9* 6.6  HGB 7.4* 6.9* 7.0* 8.3* 8.5*  HCT 24.4* 22.8* 23.5* 27.0* 27.8*  MCV 82.2 83.2  --  83.9 83.2  PLT 622* 606*  --  681* 628*   Basic Metabolic Panel: Recent Labs  Lab 11/06/19 0303 11/07/19 1001 11/08/19 0802 11/09/19 0306 11/10/19 0323  NA 132* 133* 133* 138 138  K 4.2 3.5 3.4* 4.3 4.1  CL 105 106 107 112* 111  CO2 21* 17* 17* 18* 21*  GLUCOSE 152* 207* 125* 94 93  BUN 18 15 11 9 9   CREATININE 0.87 0.98 0.97 1.02 1.03  CALCIUM 7.6* 7.9*  7.9* 8.2* 8.0*  MG 1.7  --  1.6* 2.0  --   PHOS 2.6  --   --   --   --     GFR: Estimated Creatinine Clearance: 56 mL/min (by C-G formula based on SCr of 1.03 mg/dL). Liver Function Tests: Recent Labs  Lab 11/05/19 1203 11/06/19 0303 11/08/19 0802 11/09/19 0306 11/10/19 0323  AST 43* 28 18 18 19   ALT 33 29 17 19 17   ALKPHOS 150* 131* 124 131* 122  BILITOT 0.7 0.6 0.5 0.3 0.5  PROT 7.0 6.3* 6.0* 6.2* 6.0*  ALBUMIN 2.6* 2.2* 1.9* 2.2* 2.0*   No results for input(s): LIPASE, AMYLASE in the last 168 hours. No results for input(s): AMMONIA in the last 168 hours. Coagulation Profile: Recent Labs  Lab 11/05/19 1203  INR 1.2   Cardiac Enzymes: Recent Labs  Lab 11/07/19 1001  CKTOTAL 68   BNP (last 3 results) No results for input(s): PROBNP in the last 8760 hours. HbA1C: No results for input(s): HGBA1C in the last 72 hours. CBG: Recent Labs  Lab 11/10/19 1633 11/10/19 2109 11/11/19 0007 11/11/19 0330 11/11/19 0759  GLUCAP 226* 229* 157* 103* 114*   Lipid Profile: No results for input(s): CHOL, HDL, LDLCALC, TRIG, CHOLHDL, LDLDIRECT in the last 72 hours. Thyroid Function Tests: No results for input(s): TSH, T4TOTAL, FREET4, T3FREE, THYROIDAB in the last 72 hours. Anemia Panel: No results for input(s): VITAMINB12, FOLATE, FERRITIN, TIBC, IRON, RETICCTPCT in the last 72 hours. Sepsis Labs: Recent Labs  Lab 11/05/19 1203 11/05/19 1421 11/05/19 1817  LATICACIDVEN 1.3 2.1* 1.9    Recent Results (from the past 240 hour(s))  Urine culture     Status: Abnormal   Collection Time: 11/05/19 11:55 AM   Specimen: In/Out Cath Urine  Result Value Ref Range Status   Specimen Description   Final    IN/OUT CATH URINE Performed at [facility], [address], [place], [place] [address]    Special Requests   Final    NONE Performed at [facility], [address] [place], [place] [address]    Culture MULTIPLE SPECIES PRESENT, SUGGEST RECOLLECTION (A)  Final   Report Status 11/06/2019 FINAL  Final  Blood Culture (routine x 2)     Status: None    Collection Time: 11/05/19 12:03 PM   Specimen: BLOOD  Result Value Ref Range Status   Specimen Description BLOOD  Final   Special Requests NONE  Final   Culture   Final    NO GROWTH 5 DAYS Performed at [facility], [address], [place], [place] [address]    Report Status 11/10/2019 FINAL  Final  Blood Culture (routine x 2)     Status: None   Collection Time: 11/05/19 12:12 PM   Specimen: BLOOD  Result Value Ref Range Status   Specimen Description BLOOD  Final   Special Requests NONE  Final   Culture   Final    NO GROWTH 5 DAYS Performed at [facility], [address], [place],  [address]    Report Status 11/10/2019 FINAL  Final  SARS Coronavirus 2 by RT PCR (hospital order, performed in [facility] hospital lab) Nasopharyngeal Nasopharyngeal Swab     Status: None   Collection Time: 11/05/19 12:17 PM   Specimen: Nasopharyngeal Swab  Result Value Ref Range Status   SARS Coronavirus 2 NEGATIVE NEGATIVE Final    Comment: (NOTE) SARS-CoV-2 target nucleic acids are NOT DETECTED.  The SARS-CoV-2 RNA is generally detectable in  upper and lower respiratory specimens during the acute phase of infection. The lowest concentration of SARS-CoV-2 viral copies this assay can detect is 250 copies / mL. A negative result does not preclude SARS-CoV-2 infection and should not be used as the sole basis for treatment or other patient management decisions.  A negative result may occur with improper specimen collection / handling, submission of specimen other than nasopharyngeal swab, presence of viral mutation(s) within the areas targeted by this assay, and inadequate number of viral copies (<250 copies / mL). A negative result must be combined with clinical observations, patient history, and epidemiological information.  Fact Sheet for Patients:   StrictlyIdeas.no  Fact Sheet for Healthcare Providers: BankingDealers.co.za  This test  is not yet approved or  cleared by the [place] FDA and has been authorized for detection and/or diagnosis of SARS-CoV-2 by FDA under an Emergency Use Authorization (EUA).  This EUA will remain in effect (meaning this test can be used) for the duration of the COVID-19 declaration under Section 564(b)(1) of the Act, 21 U.S.C. section 360bbb-3(b)(1), unless the authorization is terminated or revoked sooner.  Performed at [facility], [address], [place], [place] [address]      Radiology Studies: No results found.   LOS: 6 days   Antonieta Pert, MD [facility]  11/11/2019, 8:33 AM

## 2019-11-11 NOTE — Anesthesia Procedure Notes (Signed)
Anesthesia Regional Block: Popliteal block   Pre-Anesthetic Checklist: ,, timeout performed, Correct Patient, Correct Site, Correct Laterality, Correct Procedure, Correct Position, site marked, Risks and benefits discussed,  Surgical consent,  Pre-op evaluation,  At surgeon's request and post-op pain management  Laterality: Left  Prep: chloraprep       Needles:  Injection technique: Single-shot  Needle Type: Echogenic Needle     Needle Length: 10cm  Needle Gauge: 21     Additional Needles:   Narrative:  Start time: 11/11/2019 11:02 AM End time: 11/11/2019 11:05 AM Injection made incrementally with aspirations every 5 mL.  Performed by: Personally  Anesthesiologist: Audry Pili, MD  Additional Notes: No pain on injection. No increased resistance to injection. Injection made in 5cc increments. Good needle visualization. Patient tolerated the procedure well.

## 2019-11-11 NOTE — Progress Notes (Addendum)
  Progress Note    11/11/2019 7:47 AM 2 Days Post-Op  Subjective:  Patient had a discussion with his daughter and he is willing to proceed with amputation   Vitals:   11/11/19 0001 11/11/19 0332  BP: 120/84 113/77  Pulse: 79 96  Resp: (!) 23 (!) 21  Temp: 98 F (36.7 C) (!) 97.5 F (36.4 C)  SpO2: 98% 98%   Physical Exam: Lungs:  Non labored Incisions:  R BKA dry eschar Extremities:  L heel ulcer with dressing in place Neurologic: A&O  CBC    Component Value Date/Time   WBC 10.4 11/08/2019 0802   RBC 3.34 (L) 11/08/2019 0802   HGB 8.5 (L) 11/08/2019 0802   HCT 27.8 (L) 11/08/2019 0802   PLT 675 (H) 11/08/2019 0802   MCV 83.2 11/08/2019 0802   MCH 25.4 (L) 11/08/2019 0802   MCHC 30.6 11/08/2019 0802   RDW 15.3 11/08/2019 0802   LYMPHSABS 2.0 11/08/2019 0802   MONOABS 0.9 11/08/2019 0802   EOSABS 0.7 (H) 11/08/2019 0802   BASOSABS 0.1 11/08/2019 0802    BMET    Component Value Date/Time   NA 138 11/10/2019 0323   K 4.1 11/10/2019 0323   CL 111 11/10/2019 0323   CO2 21 (L) 11/10/2019 0323   GLUCOSE 93 11/10/2019 0323   BUN 9 11/10/2019 0323   CREATININE 1.03 11/10/2019 0323   CREATININE 1.07 10/28/2019 1205   CALCIUM 8.0 (L) 11/10/2019 0323   GFRNONAA >60 11/10/2019 0323   GFRNONAA 63 10/28/2019 1205   GFRAA >60 11/10/2019 0323   GFRAA 73 10/28/2019 1205    INR    Component Value Date/Time   INR 1.2 11/05/2019 1203     Intake/Output Summary (Last 24 hours) at 11/11/2019 0747 Last data filed at 11/11/2019 0407 Gross per 24 hour  Intake 1515.28 ml  Output 1250 ml  Net 265.28 ml     Assessment/Plan:  86 y.o. male with osteomyelitis L heel 2 Days Post-Op   After discussion with his daughter, he is willing to proceed with L BKA We will keep him NPO this morning and check OR availability for today Consent   Dagoberto Ligas, PA-C [facility] [phone] 11/11/2019 7:47 AM  Agree with above.  Pt for BKA will work on  scheduling  Ruta Hinds, MD [facility] [place] Office: [phone]  Dr Early to do left BKA today.  D/w daughter who would like to be called post procedure.  Ruta Hinds, MD [facility] [place] Office: [phone]

## 2019-11-11 NOTE — Progress Notes (Signed)
Patient ID: Anthony Dunlap, male   DOB: 05-13-1933, 86 y.o.   MRN: [id number]          [facility]    Date of Admission:  11/05/2019   Total days of antibiotics 7         Mr. Sinclair has a chronic left heel pressure ulcer complicated by calcaneal osteomyelitis.  He agreed with BKA which will cure his infection.  He is currently in the OR.  I recommend stopping his antibiotics postoperatively.  I will sign off now.         Michel Bickers, MD [facility] [place] [facility] [phone] pager   [phone] cell 11/11/2019, 11:02 AM

## 2019-11-11 NOTE — Progress Notes (Addendum)
Progress Note  Patient Name: Anthony Dunlap Date of Encounter: 11/11/2019  Primary Cardiologist: Carlyle Dolly, MD  Subjective   Reports feeling pretty good, no CP or SOB. Notes indicate potential plan for amputation today.  Inpatient Medications    Scheduled Meds: . sodium chloride   Intravenous Once  . sodium chloride   Intravenous Once  . amitriptyline  20 mg Oral QHS  . aspirin EC  81 mg Oral Q breakfast  . Chlorhexidine Gluconate Cloth  6 each Topical Daily  . collagenase   Topical Daily  . diltiazem  60 mg Oral Q6H  . ferrous sulfate  325 mg Oral Q breakfast  . insulin aspart  0-15 Units Subcutaneous TID WC  . insulin aspart  0-5 Units Subcutaneous QHS  . insulin glargine  15 Units Subcutaneous QHS  . levothyroxine  100 mcg Oral Q breakfast  . metoprolol tartrate  25 mg Oral Q8H  . multivitamin with minerals  1 tablet Oral Daily  . pantoprazole (PROTONIX) IV  40 mg Intravenous QHS  . polyethylene glycol  17 g Oral Daily  . pravastatin  10 mg Oral Q supper  . primidone  150 mg Oral QHS  . tobramycin  2 drop Both Eyes Q4H while awake   Continuous Infusions: . sodium chloride 50 mL/hr at 11/11/19 0020  . ampicillin-sulbactam (UNASYN) IV 3 g (11/11/19 0701)  . DAPTOmycin (CUBICIN)  IV 700 mg (11/10/19 2034)   PRN Meds: acetaminophen **OR** acetaminophen, ALPRAZolam, colchicine, HYDROcodone-acetaminophen, liver oil-zinc oxide   Vital Signs    Vitals:   11/10/19 2039 11/11/19 0001 11/11/19 0332 11/11/19 0802  BP: 117/68 120/84 113/77 104/82  Pulse: 95 79 96 84  Resp: (!) 21 (!) 23 (!) 21 16  Temp: 97.7 F (36.5 C) 98 F (36.7 C) (!) 97.5 F (36.4 C) 98.7 F (37.1 C)  TempSrc: Oral Oral Oral Oral  SpO2: 96% 98% 98%   Weight:      Height:        Intake/Output Summary (Last 24 hours) at 11/11/2019 0819 Last data filed at 11/11/2019 0407 Gross per 24 hour  Intake 1515.28 ml  Output 1250 ml  Net 265.28 ml   Last 3 Weights 11/10/2019 11/09/2019 11/09/2019   Weight (lbs) 182 lb 8 oz 179 lb 0.2 oz 179 lb 0.2 oz  Weight (kg) 82.781 kg 81.2 kg 81.2 kg     Telemetry    Atrial fib rates elevated earlier but now 70s-80s - Personally Reviewed  Physical Exam   GEN: No acute distress.  HEENT: Normocephalic, atraumatic, sclera non-icteric. Neck: No JVD or bruits. Cardiac: Irregularly irregular, rate controlled, no murmurs, rubs, or gallops.   Respiratory: Clear to auscultation bilaterally. Breathing is unlabored. GI: Soft, nontender, non-distended, BS +x 4. MS:   S/p R BKA and left heal ulcer wrapped, no edema Extremities: No clubbing or cyanosis.  S/p R BKA and left heal ulcer wrapped, no edema Neuro:  AAOx3. Follows commands. Psych:  Responds to questions appropriately with a normal affect.  Labs    High Sensitivity Troponin:  No results for input(s): TROPONINIHS in the last 720 hours.    Cardiac EnzymesNo results for input(s): TROPONINI in the last 168 hours. No results for input(s): TROPIPOC in the last 168 hours.   Chemistry Recent Labs  Lab 11/08/19 0802 11/09/19 0306 11/10/19 0323  NA 133* 138 138  K 3.4* 4.3 4.1  CL 107 112* 111  CO2 17* 18* 21*  GLUCOSE 125* 94 93  BUN 11 9 9   CREATININE 0.97 1.02 1.03  CALCIUM 7.9* 8.2* 8.0*  PROT 6.0* 6.2* 6.0*  ALBUMIN 1.9* 2.2* 2.0*  AST 18 18 19   ALT 17 19 17   ALKPHOS 124 131* 122  BILITOT 0.5 0.3 0.5  GFRNONAA >60 >60 >60  GFRAA >60 >60 >60  ANIONGAP 9 8 6      Hematology Recent Labs  Lab 11/06/19 0303 11/06/19 0303 11/06/19 2054 11/07/19 1001 11/08/19 0802  WBC 10.5  --   --  10.3 10.4  RBC 2.74*  --   --  3.22* 3.34*  HGB 6.9*   < > 7.0* 8.3* 8.5*  HCT 22.8*   < > 23.5* 27.0* 27.8*  MCV 83.2  --   --  83.9 83.2  MCH 25.2*  --   --  25.8* 25.4*  MCHC 30.3  --   --  30.7 30.6  RDW 14.6  --   --  15.0 15.3  PLT 606*  --   --  681* 675*   < > = values in this interval not displayed.    BNPNo results for input(s): BNP, PROBNP in the last 168 hours.   DDimer No  results for input(s): DDIMER in the last 168 hours.   Radiology    No results found.  Cardiac Studies   2D Echo 11/06/19 IMPRESSIONS    1. Left ventricular ejection fraction, by estimation, is 60 to 65%. The  left ventricle has normal function. Left ventricular endocardial border  not optimally defined to evaluate regional wall motion. There is mild left  ventricular hypertrophy. Left  ventricular diastolic parameters are indeterminate.  2. Right ventricular systolic function is normal. The right ventricular  size is normal.  3. Left atrial size was mildly dilated.  4. The pericardial effusion is circumferential.  5. The mitral valve is normal in structure. Trivial mitral valve  regurgitation. No evidence of mitral stenosis.  6. The aortic valve was not well visualized. Aortic valve regurgitation  is not visualized. No aortic stenosis is present.   Patient Profile     86 y.o. 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 He was admitted 10/26/19 with sepsis and left heal pressure ulcer with underlying calcaneal osteomyelitis, also found to have worsening anemia requiring transfusion. His wife of 31 years died recently. Cardiology following for atrial fibrillation. He was on DAPT prior to admission.  Assessment & Plan    1. Sepsis, osteomyelitis L heel in context of PAD - being managed by IM, ID, and vascular surgery with planned amputation, likely contributing to development of #2 - check lipid profile in AM and consider titration of statin  2.  Newly recognized atrial fibrillation with RVR  - currently managed with rate control strategy on short acting diltiazem 60mg  q6hr and metoprolol 25mg  q8hr - consider consolidation of diltiazem - anticoag on hold given recent melena/anemia, also pending upcoming procedure - will clarify eventual plan with MD  since he was also on DAPT prior to admission  3.  Pericardial effusion - small by echo, no further workup at this time  4.  Anemia - transfused for Hgb 6.9, last Hgb 8.5 on 9/21, repeat CBC ordered for tomorrow - EGD 09/20 was normal, does not seem like colonoscopy is planned - hospitalist notes indicate that GI gave OK to restart anticoag for atrial fib when warranted (on hold due to anticipated procedure)  ?  Consider DVT ppx postoperatively - not on heparin or Lovenox and unable to do SCDs with lower extremity disease.  For questions or updates, please contact [place] Please consult [email or web address] for contact info under Cardiology/STEMI.  Signed, Charlie Pitter, PA-C 11/11/2019, 8:19 AM

## 2019-11-11 NOTE — Consult Note (Signed)
Urology Consult   Physician requesting consult: Antonieta Pert MD  Reason for consult: leaking around foley catheter  History of Present Illness: Anthony Dunlap is an 86 year old male seen in consultation for leakage around foley catheter.  He has a history of prostate cancer treated with brachytherapy in late 2009.  His PSA has remained undetectable at less than 0.1.  He was last seen in the office by Dr. Jeffie Pollock and March 2019.  He did undergo a circumcision for paraphimosis in 2018.  He has had difficulties with urethral stricture in the past.  He is also had difficulties with urinary retention.  He has declined to learn CIC or considering suprapubic tube.  Thus he has been managed with an indwelling Foley catheter for several years since 2019.  Urodynamics has been performed in the office that demonstrated a hypotonic bladder.  A cystoscopy in the office demonstrated no strictures and a wide open bladder neck in 2019.  He undergoes Foley catheter exchanges monthly.  Nursing staff have noticed leakage around Foley catheter today.  Of note he has had adequate drainage from his Foley catheter however most of his urine is actually been draining around the catheter.  Initially it was difficult to irrigate with his indwelling Foley catheter.  This was then exchanged and irrigated easily however he still had significant leakage around the catheter.  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  Past Medical History:  Diagnosis Date  . Chronic heel ulcer, left, limited to breakdown of skin ([place]) 10/28/2019  . 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 Diabetes ([place])   . Drug-induced hepatitis 06/05/2018  . Inguinal hernia    06/09/2019: per patient " a long time ago on the right side"  . Left eye pain   . Localized osteoarthrosis of right hip 01/05/2019  . Prostate cancer [facility])     Past Surgical History:  Procedure Laterality Date  .  ABDOMINAL AORTOGRAM W/LOWER EXTREMITY Bilateral 08/12/2019   Procedure: ABDOMINAL AORTOGRAM W/LOWER EXTREMITY;  Surgeon: Marty Heck, MD;  Location: [facility] [facility];  Service: Cardiovascular;  Laterality: Bilateral;  . ABDOMINAL AORTOGRAM W/LOWER EXTREMITY Left 08/19/2019   Procedure: ABDOMINAL AORTOGRAM W/LOWER EXTREMITY;  Surgeon: Marty Heck, MD;  Location: [facility];  Service: Cardiovascular;  Laterality: Left;  . AMPUTATION Right 09/28/2019   Procedure: AMPUTATION BELOW KNEE;  Surgeon: Rosetta Posner, MD;  Location: [facility];  Service: Vascular;  Laterality: Right;  . ESOPHAGOGASTRODUODENOSCOPY (EGD) WITH PROPOFOL N/A 11/09/2019   Procedure: ESOPHAGOGASTRODUODENOSCOPY (EGD) WITH PROPOFOL;  Surgeon: Lavena Bullion, DO;  Location: [facility];  Service: Gastroenterology;  Laterality: N/A;  . HERNIA REPAIR    . IR RADIOLOGIST EVAL & MGMT  03/06/2018  . PERIPHERAL VASCULAR BALLOON ANGIOPLASTY Left 08/19/2019   Procedure: PERIPHERAL VASCULAR BALLOON ANGIOPLASTY;  Surgeon: Marty Heck, MD;  Location: [facility];  Service: Cardiovascular;  Laterality: Left;  . PROSTATE SURGERY    . TOTAL HIP ARTHROPLASTY Right 06/12/2019   Procedure: RIGHT TOTAL HIP ARTHROPLASTY DIRECT ANTERIOR;  Surgeon: Marybelle Killings, MD;  Location: [facility];  Service: Orthopedics;  Laterality: Right;    Medications:  Home meds:  No current facility-administered medications on file prior to encounter.   Current Outpatient Medications on File Prior to Encounter  Medication Sig Dispense Refill  . acetaminophen (TYLENOL) 500 MG tablet Take 1,000 mg by mouth every 6 (six) hours as needed  for mild pain or moderate pain.    [name] ALPRAZolam (XANAX) 0.5 MG tablet Take 1 tablet (0.5 mg total) by mouth 2 (two) times daily as needed for anxiety. 4 tablet 0  . amitriptyline (ELAVIL) 10 MG tablet Take 20 mg by mouth at bedtime.     [name] amoxicillin-clavulanate (AUGMENTIN) 875-125 MG tablet TAKE 1 TABLET  BY MOUTH TWICE DAILY (Patient taking differently: Take 1 tablet by mouth 2 (two) times daily. ) 60 tablet 11  . aspirin EC 81 MG tablet Take 81 mg by mouth daily with breakfast. Swallow whole.     . ciprofloxacin (CIPRO) 500 MG tablet Take 500 mg by mouth 2 (two) times daily.    . clopidogrel (PLAVIX) 75 MG tablet Take 1 tablet (75 mg total) by mouth daily. (Patient not taking: Reported on 10/28/2019) 30 tablet 11  . colchicine 0.6 MG tablet Take 0.6 mg by mouth daily as needed (gout attacks).     . ferrous sulfate 325 (65 FE) MG tablet Take 1 tablet (325 mg total) by mouth 2 (two) times daily with a meal.  3  . furosemide (LASIX) 20 MG tablet Take 1 tablet (20 mg total) by mouth daily with breakfast. (Patient taking differently: Take 20 mg by mouth daily as needed for fluid. ) 30 tablet   . glimepiride (AMARYL) 4 MG tablet Take 4 mg by mouth daily with breakfast.     . insulin aspart (NOVOLOG) 100 UNIT/ML injection Inject 0-15 Units into the skin 3 (three) times daily with meals. Sliding scale insulin Less than 70 initiate hypoglycemia protocol 70-120  0 units 120-150 2 unit 151-200 3 units 201-250 3 units 251-300 5 units 301-350 8 units 351-400 11 units  Greater than 400 15 units , call MD (Patient taking differently: Inject 0-16 Units into the skin See admin instructions. Inject 0-16 units subcutaneously three times daily with meals per sliding scale: CBG 70-120 0 units 120-150 4 units, 151-200 5 units, 201-250 6 units, 251-300 7 units, 301-350 10 units, 351-400 13 units, >400 16 units and call MD (Less than 70 initiate hypoglycemia protocol)) 10 mL 11  . Insulin Glargine-Lixisenatide (SOLIQUA) 100-33 UNT-MCG/ML SOPN Inject 40 Units into the skin daily after breakfast.    . levothyroxine (SYNTHROID, LEVOTHROID) 100 MCG tablet Take 100 mcg by mouth daily with breakfast.     . meloxicam (MOBIC) 7.5 MG tablet Take 7.5 mg by mouth 2 (two) times daily.    . Multiple Vitamin (MULTIVITAMIN WITH  MINERALS) TABS tablet Take 1 tablet by mouth daily.    . nutrition supplement, JUVEN, (JUVEN) PACK Take 1 packet by mouth 2 (two) times daily between meals.  0  . Nutritional Supplements (,FEEDING SUPPLEMENT, PROSOURCE PLUS) liquid Take 30 mLs by mouth 2 (two) times daily with a meal.    . nystatin (MYCOSTATIN/NYSTOP) powder Apply topically 4 (four) times daily. (Patient taking differently: Apply 1 application topically daily as needed (yeast). ) 60 g 8  . oxyCODONE (OXY IR/ROXICODONE) 5 MG immediate release tablet Take 1 tablet (5 mg total) by mouth every 4 (four) hours as needed for moderate pain. 6 tablet 0  . pantoprazole (PROTONIX) 40 MG tablet Take 1 tablet (40 mg total) by mouth daily.    . pravastatin (PRAVACHOL) 10 MG tablet Take 10 mg by mouth daily with supper.     . primidone (MYSOLINE) 50 MG tablet Take 150 mg by mouth at bedtime.     . senna-docusate (SENOKOT-S) 8.6-50 MG tablet Take 1 tablet  by mouth 2 (two) times daily.    . silver sulfADIAZINE (SILVADENE) 1 % cream Apply topically 2 (two) times daily. 50 g 0  . sulfamethoxazole-trimethoprim (BACTRIM DS) 800-160 MG tablet Take 1 tablet by mouth 2 (two) times daily.       Scheduled Meds: . sodium chloride   Intravenous Once  . sodium chloride   Intravenous Once  . amitriptyline  20 mg Oral QHS  . aspirin EC  81 mg Oral Q breakfast  . Chlorhexidine Gluconate Cloth  6 each Topical Daily  . collagenase   Topical Daily  . diltiazem  60 mg Oral Q6H  . [START ON 11/12/2019] docusate sodium  100 mg Oral Daily  . ferrous sulfate  325 mg Oral Q breakfast  . [START ON 11/12/2019] heparin  5,000 Units Subcutaneous Q8H  . insulin aspart  0-15 Units Subcutaneous TID WC  . insulin aspart  0-5 Units Subcutaneous QHS  . insulin glargine  15 Units Subcutaneous QHS  . levothyroxine  100 mcg Oral Q breakfast  . metoprolol tartrate  25 mg Oral Q8H  . multivitamin with minerals  1 tablet Oral Daily  . oxybutynin  5 mg Oral QHS  . pantoprazole  (PROTONIX) IV  40 mg Intravenous QHS  . polyethylene glycol  17 g Oral Daily  . pravastatin  10 mg Oral Q supper  . primidone  150 mg Oral QHS  . tobramycin  2 drop Both Eyes Q4H while awake   Continuous Infusions: . sodium chloride 50 mL/hr at 11/11/19 0020  . sodium chloride 75 mL/hr at 11/11/19 1521  . ampicillin-sulbactam (UNASYN) IV 3 g (11/11/19 0701)  . DAPTOmycin (CUBICIN)  IV 700 mg (11/10/19 2034)  . magnesium sulfate bolus IVPB     PRN Meds:.acetaminophen **OR** acetaminophen, ALPRAZolam, alum & mag hydroxide-simeth, colchicine, guaiFENesin-dextromethorphan, hydrALAZINE, HYDROcodone-acetaminophen, labetalol, liver oil-zinc oxide, magnesium sulfate bolus IVPB, metoprolol tartrate, morphine injection, ondansetron, oxyCODONE-acetaminophen, phenol, potassium chloride  Allergies:  Allergies  Allergen Reactions  . Fluconazole Other (See Comments)    Drug-induced hepatitis    Family History  Problem Relation Age of Onset  . Pneumonia Father   . Cancer Sister     Social History:  reports that he has never smoked. He has never used smokeless tobacco. He reports that he does not drink alcohol and does not use drugs.  ROS: A complete review of systems was performed.  All systems are negative except for pertinent findings as noted.  Physical Exam:  Vital signs in last 24 hours: Temp:  [97.5 F (36.4 C)-98.7 F (37.1 C)] 97.8 F (36.6 C) (09/22 1415) Pulse Rate:  [79-109] 109 (09/22 1458) Resp:  [16-26] 18 (09/22 1415) BP: (90-120)/(62-86) 112/63 (09/22 1458) SpO2:  [95 %-98 %] 95 % (09/22 1415) Constitutional:  Alert and oriented, No acute distress Cardiovascular: Regular rate and rhythm Respiratory: Normal respiratory effort GI: Abdomen is soft, nontender, nondistended, no abdominal masses Genitourinary: No CVAT. Hidden penis, ventral urethral erosion present Rectal: Prostate size is estimated to be 20 cc, anodular. Neurologic: Grossly intact, no focal  deficits Psychiatric: Normal mood and affect  Laboratory Data:  No results for input(s): WBC, HGB, HCT, PLT in the last 72 hours.  Recent Labs    11/09/19 0306 11/10/19 0323  NA 138 138  K 4.3 4.1  CL 112* 111  GLUCOSE 94 93  BUN 9 9  CALCIUM 8.2* 8.0*  CREATININE 1.02 1.03     Results for orders placed or performed during the hospital  encounter of 11/05/19 (from the past 24 hour(s))  Glucose, capillary     Status: Abnormal   Collection Time: 11/10/19  4:33 PM  Result Value Ref Range   Glucose-Capillary 226 (H) 70 - 99 mg/dL  Glucose, capillary     Status: Abnormal   Collection Time: 11/10/19  9:09 PM  Result Value Ref Range   Glucose-Capillary 229 (H) 70 - 99 mg/dL  Glucose, capillary     Status: Abnormal   Collection Time: 11/11/19 12:07 AM  Result Value Ref Range   Glucose-Capillary 157 (H) 70 - 99 mg/dL  Glucose, capillary     Status: Abnormal   Collection Time: 11/11/19  3:30 AM  Result Value Ref Range   Glucose-Capillary 103 (H) 70 - 99 mg/dL  Glucose, capillary     Status: Abnormal   Collection Time: 11/11/19  7:59 AM  Result Value Ref Range   Glucose-Capillary 114 (H) 70 - 99 mg/dL  Glucose, capillary     Status: Abnormal   Collection Time: 11/11/19 11:00 AM  Result Value Ref Range   Glucose-Capillary 114 (H) 70 - 99 mg/dL  Glucose, capillary     Status: Abnormal   Collection Time: 11/11/19  1:22 PM  Result Value Ref Range   Glucose-Capillary 140 (H) 70 - 99 mg/dL   Comment 1 Notify RN    Comment 2 Document in Chart    Recent Results (from the past 240 hour(s))  Urine culture     Status: Abnormal   Collection Time: 11/05/19 11:55 AM   Specimen: In/Out Cath Urine  Result Value Ref Range Status   Specimen Description   Final    IN/OUT CATH URINE Performed at [facility], [address], [place], [place] [address]    Special Requests   Final    NONE Performed at [facility], [address] [place], [place] [address]    Culture MULTIPLE SPECIES  PRESENT, SUGGEST RECOLLECTION (A)  Final   Report Status 11/06/2019 FINAL  Final  Blood Culture (routine x 2)     Status: None   Collection Time: 11/05/19 12:03 PM   Specimen: BLOOD  Result Value Ref Range Status   Specimen Description BLOOD  Final   Special Requests NONE  Final   Culture   Final    NO GROWTH 5 DAYS Performed at [facility], [address] [place], [place] [address]    Report Status 11/10/2019 FINAL  Final  Blood Culture (routine x 2)     Status: None   Collection Time: 11/05/19 12:12 PM   Specimen: BLOOD  Result Value Ref Range Status   Specimen Description BLOOD  Final   Special Requests NONE  Final   Culture   Final    NO GROWTH 5 DAYS Performed at [facility], [address] [place], [place] [address]    Report Status 11/10/2019 FINAL  Final  SARS Coronavirus 2 by RT PCR (hospital order, performed in [facility] hospital lab) Nasopharyngeal Nasopharyngeal Swab     Status: None   Collection Time: 11/05/19 12:17 PM   Specimen: Nasopharyngeal Swab  Result Value Ref Range Status   SARS Coronavirus 2 NEGATIVE NEGATIVE Final    Comment: (NOTE) SARS-CoV-2 target nucleic acids are NOT DETECTED.  The SARS-CoV-2 RNA is generally detectable in upper and lower respiratory specimens during the acute phase of infection. The lowest concentration of SARS-CoV-2 viral copies this assay can detect is 250 copies / mL. A negative result does not preclude SARS-CoV-2 infection  and should not be used as the sole basis for treatment or other patient management decisions.  A negative result may occur with improper specimen collection / handling, submission of specimen other than nasopharyngeal swab, presence of viral mutation(s) within the areas targeted by this assay, and inadequate number of viral copies (<250 copies / mL). A negative result must be combined with clinical observations, patient history, and epidemiological information.  Fact Sheet for Patients:    StrictlyIdeas.no  Fact Sheet for Healthcare Providers: BankingDealers.co.za  This test is not yet approved or  cleared by the [place] FDA and has been authorized for detection and/or diagnosis of SARS-CoV-2 by FDA under an Emergency Use Authorization (EUA).  This EUA will remain in effect (meaning this test can be used) for the duration of the COVID-19 declaration under Section 564(b)(1) of the Act, 21 U.S.C. section 360bbb-3(b)(1), unless the authorization is terminated or revoked sooner.  Performed at [facility], [address], [place], [place] [address]     Renal Function: Recent Labs    11/05/19 1203 11/06/19 0303 11/07/19 1001 11/08/19 0802 11/09/19 0306 11/10/19 0323  CREATININE 1.04 0.87 0.98 0.97 1.02 1.03   Estimated Creatinine Clearance: 56 mL/min (by C-G formula based on SCr of 1.03 mg/dL).  Radiologic Imaging: No results found.  I independently reviewed the above imaging studies.  Impression/Recommendation 1.  Incompetent external urethral sphincter: Cystoscopy demonstrated wide open bladder neck to the level of the trigone with an atrophic prostate given history of radiation treatment for prostate cancer.  His external urethral sphincter is incompetent on direct visualization.  9 French Foley catheter was placed successfully into the bladder over a wire.  This was irrigated without difficulty however there is persistent leakage around the catheter given incompetence of his sphincter and erosion from his chronic indwelling Foley catheter.  I discussed CIC or suprapubic tube as was discussed previously in the office is options for him.  He does not want to consider this at this time and wishes to remain with Foley catheter in place. 2.  History of prostate cancer treated with brachytherapy in 2009.  PSAs have been undetectable.  Last PSA was undetectable in March 2019.  Obtain PSA today.  Follow-up in the  office with Dr. Jeffie Pollock. Messaged schedulers to arrange.  Please call with questions.  Anthony Dunlap 11/11/2019, 3:31 PM  [facility]  Pager: [phone]   CC: Antonieta Pert

## 2019-11-11 NOTE — Anesthesia Procedure Notes (Signed)
Procedure Name: LMA Insertion Date/Time: 11/11/2019 11:44 AM Performed by: Griffin Dakin, CRNA Pre-anesthesia Checklist: Patient identified, Emergency Drugs available, Suction available and Patient being monitored Patient Re-evaluated:Patient Re-evaluated prior to induction Oxygen Delivery Method: Circle system utilized Preoxygenation: Pre-oxygenation with 100% oxygen Induction Type: IV induction Ventilation: Mask ventilation without difficulty LMA: LMA inserted LMA Size: 5.0 Number of attempts: 1 Placement Confirmation: positive ETCO2 and breath sounds checked- equal and bilateral Tube secured with: Tape Dental Injury: Teeth and Oropharynx as per pre-operative assessment

## 2019-11-11 NOTE — OR Nursing (Signed)
Patient clean in OR 11 before being moved to OR table... bowel movement and leaking foley noted. Dr. Donnetta Hutching aware of reason for delay.

## 2019-11-11 NOTE — Anesthesia Procedure Notes (Signed)
Anesthesia Regional Block: Adductor canal block   Pre-Anesthetic Checklist: ,, timeout performed, Correct Patient, Correct Site, Correct Laterality, Correct Procedure, Correct Position, site marked, Risks and benefits discussed,  Surgical consent,  Pre-op evaluation,  At surgeon's request and post-op pain management  Laterality: Left  Prep: chloraprep       Needles:  Injection technique: Single-shot  Needle Type: Echogenic Needle     Needle Length: 10cm  Needle Gauge: 21     Additional Needles:   Narrative:  Start time: 11/11/2019 10:58 AM End time: 11/11/2019 11:01 AM Injection made incrementally with aspirations every 5 mL.  Performed by: Personally  Anesthesiologist: Audry Pili, MD  Additional Notes: No pain on injection. No increased resistance to injection. Injection made in 5cc increments. Good needle visualization. Patient tolerated the procedure well.

## 2019-11-11 NOTE — Transfer of Care (Signed)
Immediate Anesthesia Transfer of Care Note  Patient: Anthony Dunlap  Procedure(s) Performed: AMPUTATION BELOW KNEE (Left Knee)  Patient Location: PACU  Anesthesia Type:General  Level of Consciousness: awake, alert  and oriented  Airway & Oxygen Therapy: Patient Spontanous Breathing  Post-op Assessment: Report given to RN and Post -op Vital signs reviewed and stable  Post vital signs: Reviewed and stable  Last Vitals:  Vitals Value Taken Time  BP 103/72 11/11/19 1315  Temp 36.7 C 11/11/19 1315  Pulse 81 11/11/19 1317  Resp 15 11/11/19 1318  SpO2 91 % 11/11/19 1317  Vitals shown include unvalidated device data.  Last Pain:  Vitals:   11/11/19 1127  TempSrc:   PainSc: 0-No pain      Patients Stated Pain Goal: 2 (17/53/01 0404)  Complications: No complications documented.

## 2019-11-11 NOTE — Op Note (Signed)
    OPERATIVE REPORT  DATE OF SURGERY: 11/11/2019  PATIENT: Anthony Dunlap, 86 y.o. male MRN: [id number]  DOB: July 24, 1933  PRE-OPERATIVE DIAGNOSIS: Gangrene left heel  POST-OPERATIVE DIAGNOSIS:  Same  PROCEDURE: Left below-knee amputation  SURGEON:  Curt Jews, M.D.  PHYSICIAN ASSISTANT: Karilyn Cota MS3  The assistant was needed for exposure and to expedite the case  ANESTHESIA: General  EBL: per anesthesia record  Total I/O In: -  Out: 150 [Blood:150]  BLOOD ADMINISTERED: none  DRAINS: none  SPECIMEN: none  COUNTS CORRECT:  YES  PATIENT DISPOSITION:  PACU - hemodynamically stable  PROCEDURE DETAILS: The patient was taken up and placed supine position where the area of the left leg prepped draped you sterile fashion.  Incision was made several fingerbreadths below the tibial prominence and carried down through the skin and subcutaneous tissue to the tibia.  The anterior tibial muscle bodies were divided in line with the skin incision.  A posterior based skin and muscle flap was used.  The soleus muscle was divided in line with the anterior incision and the gastrocnemius muscle was left intact with the posterior skin flap.  The saphenous vein was ligated with Vicryl ties and divided.  The anterior tibial artery was ligated in the anterior muscle body and divided.  The popliteal artery was divided posterior to the tibia.  Periosteum was elevated off the tibia and fibula and the fibula was divided with bone shears and the tibia with Gigli saw.  The bone edges were smoothed with a bone rasp.  Wounds irrigated with saline and hemostasis electrocautery.  The patient was brought and closed to the anterior flap with interrupted 0 Vicryl figure-of-eight sutures.  Skin was closed with skin staples.  Sterile dressing was applied and the patient was transferred to the recovery room in stable condition   Anthony Dunlap, M.D., [facility] 11/11/2019 1:22 PM

## 2019-11-11 NOTE — Anesthesia Postprocedure Evaluation (Signed)
Anesthesia Post Note  Patient: OWYN RAULSTON  Procedure(s) Performed: AMPUTATION BELOW KNEE (Left Knee)     Patient location during evaluation: PACU Anesthesia Type: General Level of consciousness: awake and alert Pain management: pain level controlled Vital Signs Assessment: post-procedure vital signs reviewed and stable Respiratory status: spontaneous breathing, nonlabored ventilation, respiratory function stable and patient connected to nasal cannula oxygen Cardiovascular status: blood pressure returned to baseline, stable and tachycardic Postop Assessment: no apparent nausea or vomiting Anesthetic complications: no   No complications documented.  Last Vitals:  Vitals:   11/11/19 1415 11/11/19 1458  BP: 105/62 112/63  Pulse: (!) 105 (!) 109  Resp: 18   Temp: 36.6 C   SpO2: 95%     Last Pain:  Vitals:   11/11/19 1415  TempSrc:   PainSc: 0-No pain                 Audry Pili

## 2019-11-12 ENCOUNTER — Encounter (HOSPITAL_COMMUNITY): Payer: Self-pay | Admitting: Vascular Surgery

## 2019-11-12 LAB — GLUCOSE, CAPILLARY
Glucose-Capillary: 116 mg/dL — ABNORMAL HIGH (ref 70–99)
Glucose-Capillary: 47 mg/dL — ABNORMAL LOW (ref 70–99)
Glucose-Capillary: 59 mg/dL — ABNORMAL LOW (ref 70–99)
Glucose-Capillary: 111 mg/dL — ABNORMAL HIGH (ref 70–99)
Glucose-Capillary: 147 mg/dL — ABNORMAL HIGH (ref 70–99)
Glucose-Capillary: 128 mg/dL — ABNORMAL HIGH (ref 70–99)
Glucose-Capillary: 121 mg/dL — ABNORMAL HIGH (ref 70–99)

## 2019-11-12 LAB — LIPID PANEL
Cholesterol: 117 mg/dL (ref 0–200)
HDL: 25 mg/dL — ABNORMAL LOW (ref >40)
LDL Cholesterol: 65 mg/dL (ref 0–99)
Total CHOL/HDL Ratio: 4.7 RATIO
Triglycerides: 137 mg/dL (ref <150)
VLDL: 27 mg/dL (ref 0–40)

## 2019-11-12 LAB — CBC
HCT: 25 % — ABNORMAL LOW (ref 39.0–52.0)
Hemoglobin: 7.2 g/dL — ABNORMAL LOW (ref 13.0–17.0)
MCH: 24.8 pg — ABNORMAL LOW (ref 26.0–34.0)
MCHC: 28.8 g/dL — ABNORMAL LOW (ref 30.0–36.0)
MCV: 86.2 fL (ref 80.0–100.0)
Platelets: 580 10*3/uL — ABNORMAL HIGH (ref 150–400)
RBC: 2.9 MIL/uL — ABNORMAL LOW (ref 4.22–5.81)
RDW: 16.5 % — ABNORMAL HIGH (ref 11.5–15.5)
WBC: 12 10*3/uL — ABNORMAL HIGH (ref 4.0–10.5)
nRBC: 0 % (ref 0.0–0.2)

## 2019-11-12 LAB — BASIC METABOLIC PANEL
Anion gap: 9 (ref 5–15)
BUN: 10 mg/dL (ref 8–23)
CO2: 22 mmol/L (ref 22–32)
Calcium: 7.8 mg/dL — ABNORMAL LOW (ref 8.9–10.3)
Chloride: 107 mmol/L (ref 98–111)
Creatinine, Ser: 0.95 mg/dL (ref 0.61–1.24)
GFR calc Af Amer: >60 mL/min (ref >60)
GFR calc non Af Amer: >60 mL/min (ref >60)
Glucose, Bld: 81 mg/dL (ref 70–99)
Potassium: 4.3 mmol/L (ref 3.5–5.1)
Sodium: 138 mmol/L (ref 135–145)

## 2019-11-12 LAB — HEMOGLOBIN AND HEMATOCRIT, BLOOD
HCT: 24.4 % — ABNORMAL LOW (ref 39.0–52.0)
Hemoglobin: 7.1 g/dL — ABNORMAL LOW (ref 13.0–17.0)

## 2019-11-12 LAB — PSA: Prostatic Specific Antigen: <0.01 ng/mL (ref 0.00–4.00)

## 2019-11-12 MED ORDER — METOPROLOL SUCCINATE ER 50 MG PO TB24
50.0000 mg | ORAL_TABLET | Freq: Every day | ORAL | Status: DC
  Administered 2019-11-12 – 2019-11-23 (×12): 50 mg via ORAL
  Filled 2019-11-12 (×14): qty 1

## 2019-11-12 MED ORDER — INSULIN GLARGINE 100 UNIT/ML ~~LOC~~ SOLN
7.0000 [IU] | Freq: Every day | SUBCUTANEOUS | Status: DC
  Administered 2019-11-12: 7 [IU] via SUBCUTANEOUS
  Filled 2019-11-12 (×2): qty 0.07

## 2019-11-12 MED ORDER — INSULIN ASPART 100 UNIT/ML ~~LOC~~ SOLN
0.0000 [IU] | Freq: Three times a day (TID) | SUBCUTANEOUS | Status: DC
  Administered 2019-11-12 – 2019-11-14 (×3): 1 [IU] via SUBCUTANEOUS
  Administered 2019-11-14 – 2019-11-15 (×5): 2 [IU] via SUBCUTANEOUS
  Administered 2019-11-16 (×2): 3 [IU] via SUBCUTANEOUS
  Administered 2019-11-16: 2 [IU] via SUBCUTANEOUS
  Administered 2019-11-17 (×3): 3 [IU] via SUBCUTANEOUS
  Administered 2019-11-18: 5 [IU] via SUBCUTANEOUS
  Administered 2019-11-18 (×2): 2 [IU] via SUBCUTANEOUS
  Administered 2019-11-19: 3 [IU] via SUBCUTANEOUS
  Administered 2019-11-19: 5 [IU] via SUBCUTANEOUS
  Administered 2019-11-19: 3 [IU] via SUBCUTANEOUS
  Administered 2019-11-20 (×2): 5 [IU] via SUBCUTANEOUS
  Administered 2019-11-20: 3 [IU] via SUBCUTANEOUS
  Administered 2019-11-21 (×3): 2 [IU] via SUBCUTANEOUS
  Administered 2019-11-22 (×2): 3 [IU] via SUBCUTANEOUS
  Administered 2019-11-22: 2 [IU] via SUBCUTANEOUS
  Administered 2019-11-23: 3 [IU] via SUBCUTANEOUS
  Administered 2019-11-23: 5 [IU] via SUBCUTANEOUS
  Administered 2019-11-23 – 2019-11-24 (×2): 3 [IU] via SUBCUTANEOUS
  Administered 2019-11-24: 7 [IU] via SUBCUTANEOUS
  Administered 2019-11-24: 2 [IU] via SUBCUTANEOUS
  Administered 2019-11-25 – 2019-11-26 (×5): 3 [IU] via SUBCUTANEOUS
  Administered 2019-11-26: 2 [IU] via SUBCUTANEOUS

## 2019-11-12 MED ORDER — DILTIAZEM HCL ER COATED BEADS 240 MG PO CP24
240.0000 mg | ORAL_CAPSULE | Freq: Every day | ORAL | Status: DC
  Administered 2019-11-12 – 2019-11-17 (×6): 240 mg via ORAL
  Filled 2019-11-12 (×7): qty 1

## 2019-11-12 NOTE — Progress Notes (Signed)
Progress Note  Patient Name: Anthony Dunlap Date of Encounter: 11/12/2019  Primary Cardiologist: Carlyle Dolly, MD  Subjective   Currently drinking orange juice due to hypoglycemia this AM .Denies CP or SOB.  Inpatient Medications    Scheduled Meds: . sodium chloride   Intravenous Once  . sodium chloride   Intravenous Once  . amitriptyline  20 mg Oral QHS  . aspirin EC  81 mg Oral Q breakfast  . Chlorhexidine Gluconate Cloth  6 each Topical Daily  . collagenase   Topical Daily  . diltiazem  60 mg Oral Q6H  . docusate sodium  100 mg Oral Daily  . ferrous sulfate  325 mg Oral Q breakfast  . heparin  5,000 Units Subcutaneous Q8H  . insulin aspart  0-15 Units Subcutaneous TID WC  . insulin aspart  0-5 Units Subcutaneous QHS  . insulin glargine  15 Units Subcutaneous QHS  . levothyroxine  100 mcg Oral Q breakfast  . metoprolol tartrate  25 mg Oral Q8H  . multivitamin with minerals  1 tablet Oral Daily  . oxybutynin  5 mg Oral QHS  . pantoprazole (PROTONIX) IV  40 mg Intravenous QHS  . polyethylene glycol  17 g Oral Daily  . pravastatin  10 mg Oral Q supper  . primidone  150 mg Oral QHS  . tobramycin  2 drop Both Eyes Q4H while awake   Continuous Infusions: . sodium chloride 50 mL/hr at 11/11/19 0020  . sodium chloride 75 mL/hr at 11/12/19 0737  . ampicillin-sulbactam (UNASYN) IV 3 g (11/12/19 0306)  . DAPTOmycin (CUBICIN)  IV Stopped (11/11/19 2100)  . magnesium sulfate bolus IVPB     PRN Meds: acetaminophen **OR** acetaminophen, ALPRAZolam, alum & mag hydroxide-simeth, colchicine, guaiFENesin-dextromethorphan, hydrALAZINE, labetalol, liver oil-zinc oxide, magnesium sulfate bolus IVPB, metoprolol tartrate, morphine injection, ondansetron, oxyCODONE-acetaminophen, phenol, potassium chloride   Vital Signs    Vitals:   11/12/19 0614 11/12/19 0615 11/12/19 0632 11/12/19 0804  BP: 103/66   113/70  Pulse:  95  83  Resp:   (!) 21 18  Temp:    98.1 F (36.7 C)   TempSrc:    Oral  SpO2:    96%  Weight:   79.3 kg   Height:        Intake/Output Summary (Last 24 hours) at 11/12/2019 0841 Last data filed at 11/12/2019 0600 Gross per 24 hour  Intake 820 ml  Output 870 ml  Net -50 ml   Last 3 Weights 11/12/2019 11/10/2019 11/09/2019  Weight (lbs) 174 lb 13.2 oz 182 lb 8 oz 179 lb 0.2 oz  Weight (kg) 79.3 kg 82.781 kg 81.2 kg     Telemetry    Atrial fib rate controlled - Personally Reviewed   Physical Exam   GEN: No acute distress.  HEENT: Normocephalic, atraumatic, sclera non-icteric. Neck: No JVD or bruits. Cardiac: Irregularly irregular, no murmurs, rubs, or gallops.  Radials 1+ and equal bilaterally.  Respiratory: Clear to auscultation bilaterally. Breathing is unlabored. GI: Soft, nontender, non-distended, BS +x 4. MS: see extremities below Extremities: No clubbing or cyanosis. 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 Follows commands. Psych:  Responds to questions appropriately with a normal affect.  Labs    High Sensitivity Troponin:  No results for input(s): TROPONINIHS in the last 720 hours.    Cardiac EnzymesNo results for input(s): TROPONINI in the last 168 hours. No results for input(s): TROPIPOC in the last 168  hours.   Chemistry Recent Labs  Lab 11/09/19 0306 11/09/19 0306 11/10/19 0323 11/11/19 1747 11/12/19 0622  NA 138   < > 138 137 138  K 4.3   < > 4.1 4.5 4.3  CL 112*   < > 111 108 107  CO2 18*   < > 21* 21* 22  GLUCOSE 94   < > 93 222* 81  BUN 9   < > 9 9 10   CREATININE 1.02   < > 1.03 0.90 0.95  CALCIUM 8.2*   < > 8.0* 7.7* 7.8*  PROT 6.2*  --  6.0* 5.2*  --   ALBUMIN 2.2*  --  2.0* 1.9*  --   AST 18  --  19 16  --   ALT 19  --  17 14  --   ALKPHOS 131*  --  122 115  --   BILITOT 0.3  --  0.5 0.4  --   GFRNONAA >60   < > >60 >60 >60  GFRAA >60   < > >60 >60 >60  ANIONGAP 8   < > 6 8 9    < > = values in this interval not displayed.     Hematology Recent Labs  Lab  11/08/19 0802 11/11/19 1747 11/12/19 0622  WBC 10.4 13.2* 12.0*  RBC 3.34* 2.89* 2.90*  HGB 8.5* 7.4* 7.2*  HCT 27.8* 24.7* 25.0*  MCV 83.2 85.5 86.2  MCH 25.4* 25.6* 24.8*  MCHC 30.6 30.0 28.8*  RDW 15.3 16.4* 16.5*  PLT 675* 568* 580*    BNPNo results for input(s): BNP, PROBNP in the last 168 hours.   DDimer No results for input(s): DDIMER in the last 168 hours.   Radiology    No results found.  Cardiac Studies   2D Echo 11/06/19 IMPRESSIONS    1. Left ventricular ejection fraction, by estimation, is 60 to 65%. The  left ventricle has normal function. Left ventricular endocardial border  not optimally defined to evaluate regional wall motion. There is mild left  ventricular hypertrophy. Left  ventricular diastolic parameters are indeterminate.  2. Right ventricular systolic function is normal. The right ventricular  size is normal.  3. Left atrial size was mildly dilated.  4. The pericardial effusion is circumferential.  5. The mitral valve is normal in structure. Trivial mitral valve  regurgitation. No evidence of mitral stenosis.  6. The aortic valve was not well visualized. Aortic valve regurgitation  is not visualized. No aortic stenosis is present.   Patient Profile     86 y.o. 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 He was admitted 10/26/19 with sepsis and left heal pressure ulcer with underlying calcaneal osteomyelitis, also found to have worsening anemia requiring transfusion. His wife of 44 years died recently. Cardiology following for atrial fibrillation. He was on DAPT prior to admission.  Assessment & Plan    1.Sepsis, osteomyelitis L heel in context of PAD - being managed by IM, ID, and vascular surgery, likely contributing to development of #2 - s/p left BKA 11/11/19 - consider titration of statin  2. Newly  recognized atrial fibrillation with RVR  - currently managed with rate control strategy on short acting diltiazem 60mg  q6hr and metoprolol 25mg  q8hr - consider consolidation of diltiazem - anticoag on hold given recent melena/anemia and also post-op status - will clarify timing with MD since he was also on DAPT  prior to admission with ASA/Plavix  3. Pericardial effusion - small by echo, no further workup at this time  4. Anemia - transfused for Hgb 6.9, last Hgb 8.5 on 9/21, repeat CBC ordered for tomorrow - EGD 09/20 was normal, does not seem like colonoscopy is planned - hospitalist notes indicate that GI gave OK to restart anticoag for atrial fib when warranted (on hold due to surgery) - post-op Hgb down to 7.2 this AM, further mgmt per primary teams   For questions or updates, please contact [place] Please consult [email or web address] for contact info under Cardiology/STEMI.  Signed, Charlie Pitter, PA-C 11/12/2019, 8:41 AM

## 2019-11-12 NOTE — Progress Notes (Signed)
1 Day Post-Op Subjective: Pain controlled. No nausea or emesis. Tolerating foley catheter. No significant leakage around foley overnight. 830ml clear yellow urine in urometer.  Objective: Vital signs in last 24 hours: Temp:  [97.8 F (36.6 C)-98.9 F (37.2 C)] 98.9 F (37.2 C) (09/23 0345) Pulse Rate:  [64-109] 95 (09/23 0615) Resp:  [16-26] 21 (09/23 0345) BP: (90-116)/(60-86) 103/66 (09/23 0614) SpO2:  [90 %-100 %] 95 % (09/23 0345)  Intake/Output from previous day: 09/22 0701 - 09/23 0700 In: 15 [P.O.:120; I.V.:300] Out: 220 [Urine:70; Blood:150] Intake/Output this shift: Total I/O In: 120 [P.O.:120] Out: -    UOP: 863ml clear yellow urine in urometer this AM  Physical Exam:  General: Alert and oriented CV: RRR Lungs: Clear Abdomen: Soft, ND, NT Foley catheter draining clear yellow urine  Lab Results: Recent Labs    11/11/19 1747  HGB 7.4*  HCT 24.7*   BMET Recent Labs    11/10/19 0323 11/11/19 1747  NA 138 137  K 4.1 4.5  CL 111 108  CO2 21* 21*  GLUCOSE 93 222*  BUN 9 9  CREATININE 1.03 0.90  CALCIUM 8.0* 7.7*     Studies/Results: No results found.  Assessment/Plan: 1. Catheter malfunction: Suspect it was retracted into the prostate. Replaced over a wire via cystoscopy on 9/22. Draining clear yellow urine on 9/23. 2. Incompetent external urethral sphincter: cystoscopy 9/22 demonstrated open bladder neck to the level of the trigone with an atrophic prostate given history of radiation treatment for prostate cancer. He had some erosion present around his external urethral sphincter. 3. Chronic urinary retention: Catheter in place, exchanged monthly since 2019. 4. Prostate cancer s/p brachytherapy in 2009. PSAs have been undetectable, last in 04/2017. PSA 9/22 pending.  -Leave foley catheter to gravity. Discussed options of CIC or SPT. Pt prefers indwelling foley for now. -F/u PSA -Messaged schedulers to arrange f/u with Dr. Jeffie Pollock  -Please call  with questions   LOS: 7 days   Janith Lima 11/12/2019, 6:21 AM Matt R[facility]  Pager: [phone]

## 2019-11-12 NOTE — Progress Notes (Signed)
Inpatient Rehab Admissions:  Inpatient Rehab Consult received.  I met with patient at the bedside for rehabilitation assessment and to discuss goals and expectations of an inpatient rehab admission.  He does seem confused, consistent with history of dementia.  Per chart review was at SNF for rehab following recent R BKA, now admitted for L BKA.  PT recommending CIR and OT recommending SNF.  Will need to discuss with pt's daughter what support is available to pt post-rehab as chart states he was home alone with intermittent support.    Of note: Will need insurance auth if support can be confirmed and BC[employer] Medicare will not approve SNF stay following CIR.    Signed: Shann Medal, PT, DPT Admissions Coordinator [phone] 11/12/19  4:36 PM

## 2019-11-12 NOTE — Progress Notes (Signed)
Progress Note    11/12/2019 7:15 AM 1 Day Post-Op  Subjective:  Pain is controlled. No chest pain.   Vitals:   11/12/19 0615 11/12/19 0632  BP:    Pulse: 95   Resp:  (!) 21  Temp:    SpO2:      Physical Exam: Gen: Awake and alert in NAD Cardiac:  RRR Lungs:  CTAB Extremities:  Left BKA incision surgical dressing is dry and intact. Good knee extension   CBC    Component Value Date/Time   WBC 13.2 (H) 11/11/2019 1747   RBC 2.89 (L) 11/11/2019 1747   HGB 7.4 (L) 11/11/2019 1747   HCT 24.7 (L) 11/11/2019 1747   PLT 568 (H) 11/11/2019 1747   MCV 85.5 11/11/2019 1747   MCH 25.6 (L) 11/11/2019 1747   MCHC 30.0 11/11/2019 1747   RDW 16.4 (H) 11/11/2019 1747   LYMPHSABS 2.0 11/08/2019 0802   MONOABS 0.9 11/08/2019 0802   EOSABS 0.7 (H) 11/08/2019 0802   BASOSABS 0.1 11/08/2019 0802    BMET    Component Value Date/Time   NA 137 11/11/2019 1747   K 4.5 11/11/2019 1747   CL 108 11/11/2019 1747   CO2 21 (L) 11/11/2019 1747   GLUCOSE 222 (H) 11/11/2019 1747   BUN 9 11/11/2019 1747   CREATININE 0.90 11/11/2019 1747   CREATININE 1.07 10/28/2019 1205   CALCIUM 7.7 (L) 11/11/2019 1747   GFRNONAA >60 11/11/2019 1747   GFRNONAA 63 10/28/2019 1205   GFRAA >60 11/11/2019 1747   GFRAA 73 10/28/2019 1205     Intake/Output Summary (Last 24 hours) at 11/12/2019 0715 Last data filed at 11/12/2019 0600 Gross per 24 hour  Intake 820 ml  Output 870 ml  Net -50 ml    HOSPITAL MEDICATIONS Scheduled Meds: . sodium chloride   Intravenous Once  . sodium chloride   Intravenous Once  . amitriptyline  20 mg Oral QHS  . aspirin EC  81 mg Oral Q breakfast  . Chlorhexidine Gluconate Cloth  6 each Topical Daily  . collagenase   Topical Daily  . diltiazem  60 mg Oral Q6H  . docusate sodium  100 mg Oral Daily  . ferrous sulfate  325 mg Oral Q breakfast  . heparin  5,000 Units Subcutaneous Q8H  . insulin aspart  0-15 Units Subcutaneous TID WC  . insulin aspart  0-5 Units  Subcutaneous QHS  . insulin glargine  15 Units Subcutaneous QHS  . levothyroxine  100 mcg Oral Q breakfast  . metoprolol tartrate  25 mg Oral Q8H  . multivitamin with minerals  1 tablet Oral Daily  . oxybutynin  5 mg Oral QHS  . pantoprazole (PROTONIX) IV  40 mg Intravenous QHS  . polyethylene glycol  17 g Oral Daily  . pravastatin  10 mg Oral Q supper  . primidone  150 mg Oral QHS  . tobramycin  2 drop Both Eyes Q4H while awake   Continuous Infusions: . sodium chloride 50 mL/hr at 11/11/19 0020  . sodium chloride 75 mL/hr at 11/11/19 1521  . ampicillin-sulbactam (UNASYN) IV 3 g (11/12/19 0306)  . DAPTOmycin (CUBICIN)  IV Stopped (11/11/19 2100)  . magnesium sulfate bolus IVPB     PRN Meds:.acetaminophen **OR** acetaminophen, ALPRAZolam, alum & mag hydroxide-simeth, colchicine, guaiFENesin-dextromethorphan, hydrALAZINE, labetalol, liver oil-zinc oxide, magnesium sulfate bolus IVPB, metoprolol tartrate, morphine injection, ondansetron, oxyCODONE-acetaminophen, phenol, potassium chloride  Assessment: POD1 left BKA. VSS. Afebrile. Pain controlled.  Anemia: Hgb 7.4>>7.2 post-op  Plan: -Take  dwon dressing tomorrow. PT/OT to resume today. -check H and H in AM -DVT prophylaxis:  Heparin [place]   Risa Grill, PA-C [facility] [phone] 11/12/2019  7:15 AM

## 2019-11-12 NOTE — Progress Notes (Signed)
Occupational Therapy Treatment Patient Details Name: Anthony Dunlap MRN: [id number] DOB: [birth year] Today's Date: 11/12/2019    History of present illness Pt is an 86 y.o. male admitted from SNF on 11/05/19 with sepsis, nonhealing L foot ulcer, afib with RVR. 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  Post L BKA 9/22.   OT comments  Patient is now post L BKA.  Patient assisted back to bed for the recliner.  +2 Dep lateral scoot.  Patient was not able to assist by pushing through his arms to scoot his bottom.  Generalized weakness, NWB to both legs, poor dynamic sit balance, baseline cognitive impairment significantly decrease his mobility and self care abilities.  Not completely clear what his prior level of function was.  It appears he was admitted from a skilled nursing facility for the above mentioned diagnosis.  OT will continue to follow in the acute setting.  SNF is an appropriate first rehab step.  Recommendations below are in case family decides to take him home.    Follow Up Recommendations  SNF;Supervision/Assistance - 24 hour    Equipment Recommendations  3 in 1 bedside commode;Wheelchair (measurements OT);Wheelchair cushion (measurements OT);Hospital bed [facility] lift   Recommendations for Other Services      Precautions / Restrictions Precautions Precautions: Fall Precaution Comments: R BKA, s/p 9/22 L BKA Restrictions RLE Weight Bearing: Non weight bearing LLE Weight Bearing: Non weight bearing       Mobility Bed Mobility Overal bed mobility: Needs Assistance         Sit to supine: Mod assist;+2 for physical assistance      Transfers Overall transfer level: Needs assistance              Lateral/Scoot Transfers: Near Dep assist;+2 physical assistance;+2 safety/equipment General transfer comment: recliner to bed.    Balance Overall balance  assessment: Needs assistance Sitting-balance support: Feet unsupported;Bilateral upper extremity supported Sitting balance-Leahy Scale: Poor                                     ADL either performed or assessed with clinical judgement   ADL   Eating/Feeding: Independent;Sitting   Grooming: Wash/dry hands;Set up;Sitting Grooming Details (indicate cue type and reason): Mod A for EOB balance.                               General ADL Comments: Patient is now L BKA.  Poor sitting balance EOB.  Not able to assist with scooting.     Vision        Cognition  Baseline dementia                                                                Pertinent Vitals/ Pain       Faces Pain Scale: Hurts a little bit Pain Location: L residual limb especially with movement  Pain Descriptors / Indicators: Grimacing  Frequency  Min 2X/week        Progress Toward Goals  OT Goals(current goals can now be found in the care plan section)  Progress towards OT goals: Progressing toward goals  Acute Rehab OT Goals Patient Stated Goal: Continues to want to return home with family.  Wheelchair level. OT Goal Formulation: With patient Time For Goal Achievement: 11/21/19 Potential to Achieve Goals: [place]  Plan Discharge plan remains appropriate    Co-evaluation                 AM-PAC OT "6 Clicks" Daily Activity     Outcome Measure   Help from another person eating meals?: None Help from another person taking care of personal grooming?: A Little Help from another person toileting, which includes using toliet, bedpan, or urinal?: Total Help from another person bathing (including washing, rinsing, drying)?: A Lot Help from another person to put on and taking off regular upper body clothing?: A Little Help from another person to put on and taking off  regular lower body clothing?: A Lot 6 Click Score: 15    End of Session    OT Visit Diagnosis: Muscle weakness (generalized) (M62.81);Pain Pain - Right/Left: Left Pain - part of body: Leg   Activity Tolerance Patient tolerated treatment well   Patient Left in bed;with call bell/phone within reach;with bed alarm set;with nursing/sitter in room   Nurse Communication Mobility status        Time: 1430-1451 OT Time Calculation (min): 21 min  Charges: OT General Charges $OT Visit: 1 Visit OT Treatments $Therapeutic Activity: 8-22 mins  11/12/2019  Rich, OTR/L  Acute Rehabilitation Services  Office:  [phone]    Metta Clines 11/12/2019, 2:44 PM

## 2019-11-12 NOTE — Progress Notes (Signed)
PROGRESS NOTE    Anthony Dunlap  FMB:[id number] DOB: [birth year] DOA: 11/05/2019 PCP: [name], MD   Chief Complaint  Patient presents with  . possible sepsis   Brief Narrative: 86 year old with DM2, prostate cancer, severe PVD followed by [name] status post right BKA and history of hip osteomyelitis on chronic suppressive antibiotic therapy was admitted from SNFfor fever and new onset A. fib with RVR. On admission patient was hemodynamically stable but his hemoglobin was noted to be markedly decreased from last month. History per patient's daughter who is his caregiver noted that he had been having black stool for a week prior to that. Patient was not started on anticoagulation anticoagulation for his atrial fibrillation. Patient was transfused 1 unit PRBC. Patient was seen by cardiology after being placed on a Cardizem drip. Cardizem was discontinued and patient was placed on Lopressor with reasonable control of his heart rate. Patient was seen by [name] who also follows him as an outpatient. He underwent an MRI which revealed osteomyelitis of his left heel. Skin surgery was called to consult regarding blood flow. Patient underwent EGD on 11/09/2019 which was unrevealing. Vascular has advised amputation for left heel wound/osteo and he agreed for surgery on 9/22 And underwent left BKA  Subjective:  Reports he had a lot of pain on his left leg Heart rate controlled A. fib telemetry reviewed.  No new complaints.  H&H dropped to 7.2 postop.  He had his Foley taken care of by upsizing with cystoscopy by urology yesterday  Assessment & Plan:  Left heel pressure ulcer with underlying calcaneal osteomyelitis present on admission: Appreciate ID and Dr. Nona Dell inputs-He has been on Dapto/Unasyn and now s/p left BKA 9/22, per ID recommendation discontinue antibiotics.  Sepsis POA 2/2 #1:as evident by fever, tachypnea, tachycardia and lactic acidosis.  Status post surgery as above.  Off  antibiotics.    A. fib with RVR: Rate well controlled appreciate cardiology input-meds changed to Cardizem CD 240 daily and Toprol 50 daily. Not on anticoagulation due to anemia on admission and noted cardiology plan to start anticoagulations once okay with vascular.  He does have anemia at 7.2 likely from postop on anemia of chronic disease, check at  PM, and transfuse if is still low  Anemia of chronic disease with reported melena S/P EGD 9/20 was normal.  Hemoglobin overall stable. anemia at 7.2 likely from postop on anemia of chronic disease, check at  PM, and transfuse if is still low. Continue PPI. Per GI okay to restart anticoagulation for atrial fibrillation when warrante Recent Labs  Lab 11/06/19 2054 11/07/19 1001 11/08/19 0802 11/11/19 1747 11/12/19 0622  HGB 7.0* 8.3* 8.5* 7.4* 7.2*  HCT 23.5* 27.0* 27.8* 24.7* 25.0*   Pericardial effusion, small: No further work-up at this time per cardiology  Hypokalemia/hypomagnesemia:labs stable.    PVD/Lt TSV:XBLT calcaneal osteomyelitis.  DM2:uncontrolled hba1c at 8.5.  Hypoglycemia overnight likely postop.  Decrease Lantus to 7 units from 15 units and change to sensitive scale SSI. Monitor CBG. Recent Labs  Lab 11/11/19 2119 11/12/19 0300 11/12/19 0806 11/12/19 0823 11/12/19 0856  GLUCAP 184* 116* 47* 59* 111*   Lab Results  Component Value Date   HGBA1C 8.5 (H) 11/05/2019   Anxiety/Depression: Mood is stable continue home Amitryptaline, primodine.  Hypothyroidism:Continue home Synthroid.    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 Cont ditropan as well.  DVT prophylaxis: heparin injection 5,000 Units Start: 11/12/19 0600 SCD's Start: 11/11/19 1446 SCDs Start:  11/05/19 2016 Code Status:   Code Status: Full Code  Family Communication: plan of care discussed with patient at bedside. I have discussed patient's daughter previously no family at bedside today  Status is: Inpatient Remains inpatient appropriate because:Inpatient level of care appropriate due to severity of illness  Dispo: The patient is from: Home/SNF              Anticipated d/c is to: Home vs SNF              Anticipated d/c date is: 2 days              Patient currently is not medically stable to d/c. Nutrition: Diet Order            Diet heart healthy/carb modified Room service appropriate? Yes; Fluid consistency: Thin  Diet effective now                Body mass index is 26.58 kg/m. Pressure Ulcer: Pressure Injury 11/09/19 Buttocks Right;Proximal Stage 2 -  Partial thickness loss of dermis presenting as a shallow open injury with a red, pink wound bed without slough. (Active)  11/09/19 1915  Location: Buttocks  Location Orientation: Right;Proximal  Staging: Stage 2 -  Partial thickness loss of dermis presenting as a shallow open injury with a red, pink wound bed without slough.  Wound Description (Comments):   Present on Admission:    Consultants:see note  Procedures:see note Microbiology:see note Blood Culture    Component Value Date/Time   SDES BLOOD 11/05/2019 1212   SPECREQUEST NONE 11/05/2019 1212   CULT  11/05/2019 1212    NO GROWTH 5 DAYS Performed at [facility], [address], [place], [place] [address]    REPTSTATUS 11/10/2019 FINAL 11/05/2019 1212    Other culture-see note  Medications: Scheduled Meds: . sodium chloride   Intravenous Once  . sodium chloride   Intravenous Once  . amitriptyline  20 mg Oral QHS  . aspirin EC  81 mg Oral Q breakfast  . Chlorhexidine Gluconate Cloth  6 each Topical Daily  . collagenase   Topical Daily  . diltiazem  240 mg Oral Daily  . docusate sodium  100 mg Oral Daily  . ferrous sulfate  325 mg Oral Q  breakfast  . heparin  5,000 Units Subcutaneous Q8H  . insulin aspart  0-15 Units Subcutaneous TID WC  . insulin aspart  0-5 Units Subcutaneous QHS  . insulin glargine  15 Units Subcutaneous QHS  . levothyroxine  100 mcg Oral Q breakfast  . metoprolol succinate  50 mg Oral Daily  . multivitamin with minerals  1 tablet Oral Daily  . oxybutynin  5 mg Oral QHS  . pantoprazole (PROTONIX) IV  40 mg Intravenous QHS  . polyethylene glycol  17 g Oral Daily  . pravastatin  10 mg Oral Q supper  . primidone  150 mg Oral QHS  . tobramycin  2 drop Both Eyes Q4H while awake   Continuous Infusions: . sodium chloride 50 mL/hr at 11/11/19 0020  . sodium chloride 75 mL/hr at 11/12/19 0737  . ampicillin-sulbactam (UNASYN) IV 3 g (11/12/19 0908)  . DAPTOmycin (CUBICIN)  IV Stopped (11/11/19 2100)  . magnesium sulfate bolus IVPB  Antimicrobials: Anti-infectives (From admission, onward)   Start     Dose/Rate Route Frequency Ordered Stop   11/11/19 1500  ceFAZolin (ANCEF) IVPB 2g/100 mL premix  Status:  Discontinued        2 g 200 mL/hr over 30 Minutes Intravenous Every 8 hours 11/11/19 1446 11/11/19 1513   11/07/19 1200  DAPTOmycin (CUBICIN) 700 mg in sodium chloride 0.9 % IVPB        700 mg 228 mL/hr over 30 Minutes Intravenous Daily 11/07/19 1056     11/07/19 1200  Ampicillin-Sulbactam (UNASYN) 3 g in sodium chloride 0.9 % 100 mL IVPB        3 g 200 mL/hr over 30 Minutes Intravenous Every 6 hours 11/07/19 1056     11/06/19 1300  vancomycin (VANCOREADY) IVPB 1250 mg/250 mL  Status:  Discontinued        1,250 mg 166.7 mL/hr over 90 Minutes Intravenous Every 24 hours 11/05/19 1324 11/07/19 1030   11/06/19 0000  ceFEPIme (MAXIPIME) 2 g in sodium chloride 0.9 % 100 mL IVPB  Status:  Discontinued        2 g 200 mL/hr over 30 Minutes Intravenous Every 12 hours 11/05/19 1648 11/07/19 1030   11/05/19 2200  metroNIDAZOLE (FLAGYL) IVPB 500 mg  Status:  Discontinued        500 mg 100 mL/hr over 60  Minutes Intravenous Every 8 hours 11/05/19 1637 11/07/19 1030   11/05/19 1300  vancomycin (VANCOREADY) IVPB 2000 mg/400 mL        2,000 mg 200 mL/hr over 120 Minutes Intravenous  Once 11/05/19 1226 11/05/19 1453   11/05/19 1230  metroNIDAZOLE (FLAGYL) IVPB 500 mg        500 mg 100 mL/hr over 60 Minutes Intravenous  Once 11/05/19 1223 11/05/19 1345   11/05/19 1215  ceFEPIme (MAXIPIME) 2 g in sodium chloride 0.9 % 100 mL IVPB        2 g 200 mL/hr over 30 Minutes Intravenous  Once 11/05/19 1201 11/05/19 1254     Objective: Vitals: Today's Vitals   11/12/19 0615 11/12/19 0632 11/12/19 0804 11/12/19 1044  BP:   113/70 100/67  Pulse: 95  83 94  Resp:  (!) 21 18 18   Temp:   98.1 F (36.7 C)   TempSrc:   Oral   SpO2:   96% 98%  Weight:  79.3 kg    Height:      PainSc:    Asleep    Intake/Output Summary (Last 24 hours) at 11/12/2019 1130 Last data filed at 11/12/2019 0600 Gross per 24 hour  Intake 820 ml  Output 870 ml  Net -50 ml   Filed Weights   11/09/19 1119 11/10/19 0448 11/12/19 0960  Weight: 81.2 kg 82.8 kg 79.3 kg   Weight change:   Intake/Output from previous day: 09/22 0701 - 09/23 0700 In: 820 [P.O.:320; I.V.:300; IV Piggyback:200] Out: 870 [Urine:720; Blood:150] Intake/Output this shift: No intake/output data recorded.  Examination: General exam: AAO at baseline weak elderly. HEENT:Oral mucosa moist, Ear/Nose WNL grossly, dentition normal. Respiratory system: bilaterally clear,no wheezing or crackles,no use of accessory muscle Cardiovascular system: S1 & S2 +, No JVD,. Gastrointestinal system: Abdomen soft, NT,ND, BS+ Nervous System:Alert, awake, moving extremities and grossly nonfocal Extremities: Right BKA stump wound healing, left BKA postop dressing intact-did not removed-defer to vascular.   Skin: No rashes,no icterus. MSK: Normal muscle bulk,tone, power.  Data Reviewed: I have personally reviewed following labs and imaging studies CBC: Recent Labs  Lab 11/05/19 1203 11/05/19 1203 11/06/19 0303 11/06/19 0303 11/06/19 2054 11/07/19 1001 11/08/19 0802 11/11/19 1747 11/12/19 0622  WBC 11.0*   < > 10.5  --   --  10.3 10.4 13.2* 12.0*  NEUTROABS 8.6*  --   --   --   --  7.9* 6.6  --   --   HGB 7.4*   < > 6.9*   < > 7.0* 8.3* 8.5* 7.4* 7.2*  HCT 24.4*   < > 22.8*   < > 23.5* 27.0* 27.8* 24.7* 25.0*  MCV 82.2   < > 83.2  --   --  83.9 83.2 85.5 86.2  PLT 622*   < > 606*  --   --  681* 675* 568* 580*   < > = values in this interval not displayed.   Basic Metabolic Panel: Recent Labs  Lab 11/06/19 0303 11/07/19 1001 11/08/19 0802 11/09/19 0306 11/10/19 0323 11/11/19 1747 11/12/19 0622  NA 132*   < > 133* 138 138 137 138  K 4.2   < > 3.4* 4.3 4.1 4.5 4.3  CL 105   < > 107 112* 111 108 107  CO2 21*   < > 17* 18* 21* 21* 22  GLUCOSE 152*   < > 125* 94 93 222* 81  BUN 18   < > 11 9 9 9 10   CREATININE 0.87   < > 0.97 1.02 1.03 0.90 0.95  CALCIUM 7.6*   < > 7.9* 8.2* 8.0* 7.7* 7.8*  MG 1.7  --  1.6* 2.0  --   --   --   PHOS 2.6  --   --   --   --   --   --    < > = values in this interval not displayed.   GFR: Estimated Creatinine Clearance: 56 mL/min (by C-G formula based on SCr of 0.95 mg/dL). Liver Function Tests: Recent Labs  Lab 11/06/19 0303 11/08/19 0802 11/09/19 0306 11/10/19 0323 11/11/19 1747  AST 28 18 18 19 16   ALT 29 17 19 17 14   ALKPHOS 131* 124 131* 122 115  BILITOT 0.6 0.5 0.3 0.5 0.4  PROT 6.3* 6.0* 6.2* 6.0* 5.2*  ALBUMIN 2.2* 1.9* 2.2* 2.0* 1.9*   No results for input(s): LIPASE, AMYLASE in the last 168 hours. No results for input(s): AMMONIA in the last 168 hours. Coagulation Profile: Recent Labs  Lab 11/05/19 1203  INR 1.2   Cardiac Enzymes: Recent Labs  Lab 11/07/19 1001  CKTOTAL 68   BNP (last 3 results) No results for input(s): PROBNP in the last 8760 hours. HbA1C: No results for input(s): HGBA1C in the last 72 hours. CBG: Recent Labs  Lab 11/11/19 2119 11/12/19 0300  11/12/19 0806 11/12/19 0823 11/12/19 0856  GLUCAP 184* 116* 47* 59* 111*   Lipid Profile: Recent Labs    11/12/19 0622  CHOL 117  HDL 25*  LDLCALC 65  TRIG 137  CHOLHDL 4.7   Thyroid Function Tests: No results for input(s): TSH, T4TOTAL, FREET4, T3FREE, THYROIDAB in the last 72 hours. Anemia Panel: No results for input(s): VITAMINB12, FOLATE, FERRITIN, TIBC, IRON, RETICCTPCT in the last 72 hours. Sepsis Labs: Recent Labs  Lab 11/05/19 1203 11/05/19 1421 11/05/19 1817  LATICACIDVEN 1.3 2.1* 1.9    Recent Results (from the past 240 hour(s))  Urine culture     Status: Abnormal   Collection Time: 11/05/19 11:55 AM   Specimen: In/Out Cath Urine  Result Value Ref Range Status  Specimen Description   Final    IN/OUT CATH URINE Performed at [facility], [address] [place], [place] [address]    Special Requests   Final    NONE Performed at [facility], [address] [place], [place] [address]    Culture MULTIPLE SPECIES PRESENT, SUGGEST RECOLLECTION (A)  Final   Report Status 11/06/2019 FINAL  Final  Blood Culture (routine x 2)     Status: None   Collection Time: 11/05/19 12:03 PM   Specimen: BLOOD  Result Value Ref Range Status   Specimen Description BLOOD  Final   Special Requests NONE  Final   Culture   Final    NO GROWTH 5 DAYS Performed at [facility], [address], [place], [place] [address]    Report Status 11/10/2019 FINAL  Final  Blood Culture (routine x 2)     Status: None   Collection Time: 11/05/19 12:12 PM   Specimen: BLOOD  Result Value Ref Range Status   Specimen Description BLOOD  Final   Special Requests NONE  Final   Culture   Final    NO GROWTH 5 DAYS Performed at [facility], [address] [place], [place] [address]    Report Status 11/10/2019 FINAL  Final  SARS Coronavirus 2 by RT PCR (hospital order, performed in [facility] hospital lab) Nasopharyngeal Nasopharyngeal Swab     Status: None   Collection Time: 11/05/19 12:17 PM    Specimen: Nasopharyngeal Swab  Result Value Ref Range Status   SARS Coronavirus 2 NEGATIVE NEGATIVE Final    Comment: (NOTE) SARS-CoV-2 target nucleic acids are NOT DETECTED.  The SARS-CoV-2 RNA is generally detectable in upper and lower respiratory specimens during the acute phase of infection. The lowest concentration of SARS-CoV-2 viral copies this assay can detect is 250 copies / mL. A negative result does not preclude SARS-CoV-2 infection and should not be used as the sole basis for treatment or other patient management decisions.  A negative result may occur with improper specimen collection / handling, submission of specimen other than nasopharyngeal swab, presence of viral mutation(s) within the areas targeted by this assay, and inadequate number of viral copies (<250 copies / mL). A negative result must be combined with clinical observations, patient history, and epidemiological information.  Fact Sheet for Patients:   StrictlyIdeas.no  Fact Sheet for Healthcare Providers: BankingDealers.co.za  This test is not yet approved or  cleared by the [place] FDA and has been authorized for detection and/or diagnosis of SARS-CoV-2 by FDA under an Emergency Use Authorization (EUA).  This EUA will remain in effect (meaning this test can be used) for the duration of the COVID-19 declaration under Section 564(b)(1) of the Act, 21 U.S.C. section 360bbb-3(b)(1), unless the authorization is terminated or revoked sooner.  Performed at [facility], [address] [place], [place] [address]      Radiology Studies: No results found.   LOS: 7 days   Antonieta Pert, MD [facility]  11/12/2019, 11:30 AM

## 2019-11-12 NOTE — Progress Notes (Signed)
Hypoglycemic Event  CBG: 47  Treatment:8 oz orange juice  Symptoms:Asymptomatic  Follow-up CBG: Time:855 CBG Result:111  Possible Reasons for Event:Unknown Comments/MD notified: yes, will decrease lantus dose    Anthony Dunlap  [name]

## 2019-11-12 NOTE — Progress Notes (Addendum)
Physical Therapy Re-Evaluation and Treatment Patient Details Name: Anthony Dunlap MRN: [id number] DOB: August 31, 1933 Today's Date: 11/12/2019    History of Present Illness Pt is an 86 y.o. male admitted from SNF on 11/05/19 with sepsis, nonhealing L foot ulcer, afib with RVR. 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    PT Comments    Pt is s/p L BKA and reports mild pain in residual limb. Pt agreeable to try to get to recliner as goal of session. Pt slightly orthostatic with positional change, however asymptomatic. Pt is modAx2 for coming to EoB and maxA-totalA for scooting posteriorly to recliner. Given pt change in status PT recommending rehab consult for possible CIR placement prior to discharge home. Goals have been updated appropriately. PT will continue to follow acutely.  Orthostatic BPs  Supine 103/86  Sitting 93/71  Sitting after 3 min 102/69  After transfer to recliner 112/71      Follow Up Recommendations  CIR     Equipment Recommendations  Other (comment) (TBD at next venue)    Recommendations for Other Services Rehab consult     Precautions / Restrictions Precautions Precautions: Fall Precaution Comments: R BKA, s/p 9/22 L BKA Restrictions Weight Bearing Restrictions: Yes RLE Weight Bearing: Non weight bearing LLE Weight Bearing: Non weight bearing    Mobility  Bed Mobility Overal bed mobility: Needs Assistance Bed Mobility: Sit to Supine;Rolling       Sit to supine: Mod assist;+2 for physical assistance   General bed mobility comments: pt able to manage LE to EoB, requires modAx2 for bringing trunk to upright and keeping him from scooting hips off bed  Transfers Overall transfer level: Needs assistance Equipment used: None Transfers: Comptroller transfers: Max assist;Total assist;+2  physical assistance   General transfer comment: maxAx 2 for scooting across bed with use of bed pad, total A for pad transfer posteriorly from EOB to recliner      Balance Overall balance assessment: Needs assistance Sitting-balance support: Feet supported;Bilateral upper extremity supported Sitting balance-Leahy Scale: Poor Sitting balance - Comments: requires modA to balance EoB, able to progress to min guard with heavy use of UE Postural control: Other (comment) (anterior lean, difficulty with new CoG  )                                  Cognition Arousal/Alertness: Awake/alert Behavior During Therapy: WFL for tasks assessed/performed Overall Cognitive Status: Impaired/Different from baseline Area of Impairment: Attention;Following commands;Problem solving;Awareness;Safety/judgement                   Current Attention Level: Selective   Following Commands: Follows multi-step commands inconsistently Safety/Judgement: Decreased awareness of safety;Decreased awareness of deficits Awareness: Emergent Problem Solving: Difficulty sequencing;Requires verbal cues;Requires tactile cues General Comments: pt agreeable to getting up to chair, some difficulty with command follow to achieve, resulting in increased fatigue         General Comments General comments (skin integrity, edema, etc.): slightly orthostatic, HR increaed to 110s with movement       Pertinent Vitals/Pain Pain Assessment: Faces Faces Pain Scale: Hurts little more Pain Location: L residual limb especially with movement  Pain Descriptors / Indicators: Grimacing;Guarding Pain Intervention(s): Limited activity within patient's tolerance;Repositioned  PT Goals (current goals can now be found in the care plan section) Acute Rehab PT Goals Patient Stated Goal: "I'm not going back to rehab, I'm going home" PT Goal Formulation: With patient Time For Goal Achievement: 11/26/19 Potential to  Achieve Goals: Fair Progress towards PT goals: Goals downgraded-see care plan (s/p L BKA)    Frequency    Min 3X/week      PT Plan Discharge plan needs to be updated       AM-PAC PT "6 Clicks" Mobility   Outcome Measure  Help needed turning from your back to your side while in a flat bed without using bedrails?: A Little Help needed moving from lying on your back to sitting on the side of a flat bed without using bedrails?: A Lot Help needed moving to and from a bed to a chair (including a wheelchair)?: Total Help needed standing up from a chair using your arms (e.g., wheelchair or bedside chair)?: Total Help needed to walk in hospital room?: Total Help needed climbing 3-5 steps with a railing? : Total 6 Click Score: 9    End of Session Equipment Utilized During Treatment: Oxygen Activity Tolerance: Patient tolerated treatment well Patient left: in chair;with call bell/phone within reach;with nursing/sitter in room;with chair alarm set Nurse Communication: Mobility status PT Visit Diagnosis: Other abnormalities of gait and mobility (R26.89);Muscle weakness (generalized) (M62.81);Difficulty in walking, not elsewhere classified (R26.2)     Time: 5277-8242 PT Time Calculation (min) (ACUTE ONLY): 27 min  Charges:  $Therapeutic Activity: 8-22 mins                     Alonso Gapinski B. Migdalia Dk PT, DPT Acute Rehabilitation Services Pager [phone] Office [phone]    [place] 11/12/2019, 10:52 AM

## 2019-11-13 LAB — BASIC METABOLIC PANEL
Anion gap: 10 (ref 5–15)
BUN: 10 mg/dL (ref 8–23)
CO2: 20 mmol/L — ABNORMAL LOW (ref 22–32)
Calcium: 7.9 mg/dL — ABNORMAL LOW (ref 8.9–10.3)
Chloride: 106 mmol/L (ref 98–111)
Creatinine, Ser: 0.93 mg/dL (ref 0.61–1.24)
GFR calc Af Amer: >60 mL/min (ref >60)
GFR calc non Af Amer: >60 mL/min (ref >60)
Glucose, Bld: 79 mg/dL (ref 70–99)
Potassium: 4.1 mmol/L (ref 3.5–5.1)
Sodium: 136 mmol/L (ref 135–145)

## 2019-11-13 LAB — GLUCOSE, CAPILLARY
Glucose-Capillary: 58 mg/dL — ABNORMAL LOW (ref 70–99)
Glucose-Capillary: 83 mg/dL (ref 70–99)
Glucose-Capillary: 88 mg/dL (ref 70–99)
Glucose-Capillary: 135 mg/dL — ABNORMAL HIGH (ref 70–99)
Glucose-Capillary: 116 mg/dL — ABNORMAL HIGH (ref 70–99)
Glucose-Capillary: 178 mg/dL — ABNORMAL HIGH (ref 70–99)

## 2019-11-13 LAB — CBC
HCT: 26.9 % — ABNORMAL LOW (ref 39.0–52.0)
Hemoglobin: 7.9 g/dL — ABNORMAL LOW (ref 13.0–17.0)
MCH: 25.3 pg — ABNORMAL LOW (ref 26.0–34.0)
MCHC: 29.4 g/dL — ABNORMAL LOW (ref 30.0–36.0)
MCV: 86.2 fL (ref 80.0–100.0)
Platelets: 542 10*3/uL — ABNORMAL HIGH (ref 150–400)
RBC: 3.12 MIL/uL — ABNORMAL LOW (ref 4.22–5.81)
RDW: 16.4 % — ABNORMAL HIGH (ref 11.5–15.5)
WBC: 8.3 10*3/uL (ref 4.0–10.5)
nRBC: 0 % (ref 0.0–0.2)

## 2019-11-13 LAB — SURGICAL PATHOLOGY

## 2019-11-13 LAB — CK: Total CK: 461 U/L — ABNORMAL HIGH (ref 49–397)

## 2019-11-13 MED ORDER — TOBRAMYCIN 0.3 % OP SOLN: 2.0000 [drp] | OPHTHALMIC | 0 refills | Status: DC

## 2019-11-13 MED ORDER — DOCUSATE SODIUM 100 MG PO CAPS: 100.0000 mg | ORAL_CAPSULE | Freq: Every day | ORAL | 0 refills | Status: DC

## 2019-11-13 MED ORDER — POLYETHYLENE GLYCOL 3350 17 G PO PACK: 17.0000 g | PACK | Freq: Every day | ORAL | 0 refills | Status: DC

## 2019-11-13 MED ORDER — DILTIAZEM HCL ER COATED BEADS 240 MG PO CP24: 240.0000 mg | ORAL_CAPSULE | Freq: Every day | ORAL | Status: DC

## 2019-11-13 MED ORDER — INSULIN GLARGINE 100 UNIT/ML ~~LOC~~ SOLN
4.0000 [IU] | Freq: Every day | SUBCUTANEOUS | Status: DC
  Administered 2019-11-13 – 2019-11-16 (×4): 4 [IU] via SUBCUTANEOUS
  Filled 2019-11-13 (×5): qty 0.04

## 2019-11-13 MED ORDER — METOPROLOL SUCCINATE ER 50 MG PO TB24: 50.0000 mg | ORAL_TABLET | Freq: Every day | ORAL | Status: DC

## 2019-11-13 MED ORDER — INSULIN GLARGINE 100 UNIT/ML ~~LOC~~ SOLN: 4.0000 [IU] | Freq: Every day | SUBCUTANEOUS | 0 refills | Status: DC

## 2019-11-13 MED ORDER — OXYBUTYNIN CHLORIDE ER 5 MG PO TB24: 5.0000 mg | ORAL_TABLET | Freq: Every day | ORAL | Status: DC

## 2019-11-13 NOTE — Care Management Important Message (Signed)
Important Message  Patient Details  Name: ISMEAL HEIDER MRN: [id number] Date of Birth: Jun 12, 1933   Medicare Important Message Given:  Yes     Shelda Altes 11/13/2019, 11:21 AM

## 2019-11-13 NOTE — Progress Notes (Addendum)
  Progress Note    11/13/2019 12:48 PM 2 Days Post-Op  Subjective:  No pain L BKA per patient   Vitals:   11/13/19 0736 11/13/19 1136  BP: 137/79 129/75  Pulse: 95 93  Resp: 18 19  Temp: 97.6 F (36.4 C) 98.2 F (36.8 C)  SpO2: 100% 94%   Physical Exam: Lungs:  Non labored Incisions:  L BKA incision c/d/i Abdomen:  A&O Neurologic: A&O  CBC    Component Value Date/Time   WBC 8.3 11/13/2019 0633   RBC 3.12 (L) 11/13/2019 0633   HGB 7.9 (L) 11/13/2019 0633   HCT 26.9 (L) 11/13/2019 0633   PLT 542 (H) 11/13/2019 0633   MCV 86.2 11/13/2019 0633   MCH 25.3 (L) 11/13/2019 0633   MCHC 29.4 (L) 11/13/2019 0633   RDW 16.4 (H) 11/13/2019 0633   LYMPHSABS 2.0 11/08/2019 0802   MONOABS 0.9 11/08/2019 0802   EOSABS 0.7 (H) 11/08/2019 0802   BASOSABS 0.1 11/08/2019 0802    BMET    Component Value Date/Time   NA 136 11/13/2019 0633   K 4.1 11/13/2019 0633   CL 106 11/13/2019 0633   CO2 20 (L) 11/13/2019 0633   GLUCOSE 79 11/13/2019 0633   BUN 10 11/13/2019 0633   CREATININE 0.93 11/13/2019 0633   CREATININE 1.07 10/28/2019 1205   CALCIUM 7.9 (L) 11/13/2019 0633   GFRNONAA >60 11/13/2019 0633   GFRNONAA 63 10/28/2019 1205   GFRAA >60 11/13/2019 0633   GFRAA 73 10/28/2019 1205    INR    Component Value Date/Time   INR 1.2 11/05/2019 1203     Intake/Output Summary (Last 24 hours) at 11/13/2019 1248 Last data filed at 11/13/2019 0600 Gross per 24 hour  Intake 1253.44 ml  Output 1200 ml  Net 53.44 ml     Assessment/Plan:  86 y.o. male is s/p L BKA 2 Days Post-Op   L BKA incision unremarkable Ok to start Eliquis from vascular standpoint Office will arrange appt in 4-6 weeks for staple removal Call if questions   Dagoberto Ligas, PA-C [facility] [phone] 11/13/2019 12:48 PM  I have examined the patient, reviewed and agree with above.Will see again next week.  Please call surgeon on call for vascular concerns over the  weekend  Curt Jews, MD 11/13/2019 3:07 PM

## 2019-11-13 NOTE — Progress Notes (Signed)
PROGRESS NOTE    Anthony Dunlap  BOF:[id number] DOB: [birth year] DOA: 11/05/2019 PCP: [name], MD   Chief Complaint  Patient presents with  . possible sepsis   Brief Narrative: 86 year old with DM2, prostate cancer, severe PVD followed by [name] status post right BKA and history of hip osteomyelitis on chronic suppressive antibiotic therapy was admitted from SNFfor fever and new onset A. fib with RVR. On admission patient was hemodynamically stable but his hemoglobin was noted to be markedly decreased from last month. History per patient's daughter who is his caregiver noted that he had been having black stool for a week prior to that. Patient was not started on anticoagulation anticoagulation for his atrial fibrillation. Patient was transfused 1 unit PRBC. Patient was seen by cardiology after being placed on a Cardizem drip. Cardizem was discontinued and patient was placed on Lopressor with reasonable control of his heart rate. Patient was seen by [name] who also follows him as an outpatient. He underwent an MRI which revealed osteomyelitis of his left heel. Skin surgery was called to consult regarding blood flow. Patient underwent EGD on 11/09/2019 which was unrevealing. Vascular has advised amputation for left heel wound/osteo and he agreed for surgery on 9/22 And underwent left BKA Postop did well.  Antibiotics were discontinued as per ID recommendation.  Pain is stable.  Seen by PT OT at this time plan is for inpatient rehabilitation  Subjective:  Hypoglycemia overnight recurrent and lantus cut to 4 units Left BKA pain stable. No new complaints H/h up ay 7.9 itself  Assessment & Plan:  Left heel pressure ulcer with underlying calcaneal osteomyelitis present on admission: Appreciate ID and Dr. Nona Dell inputs-He has been on Dapto/Unasyn and now s/p left BKA 9/22, per ID recommendation he is off antibiotics  Sepsis POA 2/2 #1:as evident by fever, tachypnea, tachycardia and  lactic acidosis.  Sepsis resolved.  Off antibiotics.   A. fib with RVR: Rate well controlled appreciate cardiology input-meds changed to Cardizem CD 240 daily and Toprol 50 daily 9/23/normal sinus rhythm now.  Will defer anticoagulation to cardiology.  H&H this morning up at 7.9 g.  Anemia of chronic disease with reported melena S/P EGD 9/20 was normal.  Hemoglobin overall stable. anemia at 7.2 likely from postop on anemia of chronic disease. hB up at 7.9 gm now. Continue PPI. Per GI okay to restart anticoagulation for atrial fibrillation when warrante Recent Labs  Lab 11/08/19 0802 11/11/19 1747 11/12/19 0622 11/12/19 1601 11/13/19 0633  HGB 8.5* 7.4* 7.2* 7.1* 7.9*  HCT 27.8* 24.7* 25.0* 24.4* 26.9*   Pericardial effusion, small: No further work-up per cardiology  Hypokalemia/hypomagnesemia: Improved  PVD/Lt DPO:EUMP calcaneal osteomyelitis.  DM2:uncontrolled hba1c at 8.5.  Hypoglycemia overnight Lantus cut down to 4 units previously on 15 units. keep On sliding scales. Recent Labs  Lab 11/12/19 1700 11/12/19 2202 11/13/19 0313 11/13/19 0339 11/13/19 0801  GLUCAP 128* 121* 58* 83 88   Lab Results  Component Value Date   HGBA1C 8.5 (H) 11/05/2019   Anxiety/Depression: Mood is stable continue home Amitryptaline, primodine.  Hypothyroidism:Continue home Synthroid.    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 Cont ditropan as well.  DVT prophylaxis: heparin injection 5,000 Units Start: 11/12/19  0600 SCD's Start: 11/11/19 1446 SCDs Start: 11/05/19 2016 Code Status:   Code Status: Full Code  Family Communication: plan of care discussed with patient at bedside. I have discussed patient's daughter previously no family  at bedside today. Status is: Inpatient Remains inpatient appropriate because:Inpatient level of care appropriate due to severity of illness  Dispo: The patient is from: Home/SNF              Anticipated d/c is to: CIR              Anticipated d/c date is: Once bed available              Patient currently is not medically stable to d/c. Nutrition: Diet Order            Diet heart healthy/carb modified Room service appropriate? Yes; Fluid consistency: Thin  Diet effective now                Body mass index is 27.49 kg/m. Pressure Ulcer: Pressure Injury 11/09/19 Buttocks Right;Proximal Stage 2 -  Partial thickness loss of dermis presenting as a shallow open injury with a red, pink wound bed without slough. (Active)  11/09/19 1915  Location: Buttocks  Location Orientation: Right;Proximal  Staging: Stage 2 -  Partial thickness loss of dermis presenting as a shallow open injury with a red, pink wound bed without slough.  Wound Description (Comments):   Present on Admission:    Consultants:see note  Procedures:see note Microbiology:see note Blood Culture    Component Value Date/Time   SDES BLOOD 11/05/2019 1212   SPECREQUEST NONE 11/05/2019 1212   CULT  11/05/2019 1212    NO GROWTH 5 DAYS Performed at [facility], [address] [place], [place] [address]    REPTSTATUS 11/10/2019 FINAL 11/05/2019 1212    Other culture-see note  Medications: Scheduled Meds: . sodium chloride   Intravenous Once  . sodium chloride   Intravenous Once  . amitriptyline  20 mg Oral QHS  . aspirin EC  81 mg Oral Q breakfast  . Chlorhexidine Gluconate Cloth  6 each Topical Daily  . collagenase   Topical Daily  . diltiazem  240 mg Oral Daily  . docusate sodium  100 mg Oral Daily  . ferrous sulfate  325 mg Oral Q breakfast  . heparin  5,000 Units Subcutaneous Q8H  . insulin aspart  0-5 Units Subcutaneous QHS  . insulin aspart  0-9 Units Subcutaneous TID WC  . insulin glargine  4 Units  Subcutaneous QHS  . levothyroxine  100 mcg Oral Q breakfast  . metoprolol succinate  50 mg Oral Daily  . multivitamin with minerals  1 tablet Oral Daily  . oxybutynin  5 mg Oral QHS  . pantoprazole (PROTONIX) IV  40 mg Intravenous QHS  . polyethylene glycol  17 g Oral Daily  . pravastatin  10 mg Oral Q supper  . primidone  150 mg Oral QHS  . tobramycin  2 drop Both Eyes Q4H while awake   Continuous Infusions: . sodium chloride 75 mL/hr at 11/12/19 2228  . magnesium sulfate bolus IVPB      Antimicrobials: Anti-infectives (From admission, onward)   Start     Dose/Rate Route Frequency Ordered Stop   11/11/19 1500  ceFAZolin (ANCEF) IVPB 2g/100 mL premix  Status:  Discontinued        2 g 200 mL/hr over 30 Minutes Intravenous Every 8 hours 11/11/19 1446 11/11/19 1513   11/07/19 1200  DAPTOmycin (CUBICIN)  700 mg in sodium chloride 0.9 % IVPB  Status:  Discontinued        700 mg 228 mL/hr over 30 Minutes Intravenous Daily 11/07/19 1056 11/12/19 1141   11/07/19 1200  Ampicillin-Sulbactam (UNASYN) 3 g in sodium chloride 0.9 % 100 mL IVPB  Status:  Discontinued        3 g 200 mL/hr over 30 Minutes Intravenous Every 6 hours 11/07/19 1056 11/12/19 1141   11/06/19 1300  vancomycin (VANCOREADY) IVPB 1250 mg/250 mL  Status:  Discontinued        1,250 mg 166.7 mL/hr over 90 Minutes Intravenous Every 24 hours 11/05/19 1324 11/07/19 1030   11/06/19 0000  ceFEPIme (MAXIPIME) 2 g in sodium chloride 0.9 % 100 mL IVPB  Status:  Discontinued        2 g 200 mL/hr over 30 Minutes Intravenous Every 12 hours 11/05/19 1648 11/07/19 1030   11/05/19 2200  metroNIDAZOLE (FLAGYL) IVPB 500 mg  Status:  Discontinued        500 mg 100 mL/hr over 60 Minutes Intravenous Every 8 hours 11/05/19 1637 11/07/19 1030   11/05/19 1300  vancomycin (VANCOREADY) IVPB 2000 mg/400 mL        2,000 mg 200 mL/hr over 120 Minutes Intravenous  Once 11/05/19 1226 11/05/19 1453   11/05/19 1230  metroNIDAZOLE (FLAGYL) IVPB 500 mg         500 mg 100 mL/hr over 60 Minutes Intravenous  Once 11/05/19 1223 11/05/19 1345   11/05/19 1215  ceFEPIme (MAXIPIME) 2 g in sodium chloride 0.9 % 100 mL IVPB        2 g 200 mL/hr over 30 Minutes Intravenous  Once 11/05/19 1201 11/05/19 1254     Objective: Vitals: Today's Vitals   11/12/19 2034 11/13/19 0033 11/13/19 0433 11/13/19 0736  BP: 122/77 130/71 117/69 137/79  Pulse: 87 86 88 95  Resp: (!) 22 18 (!) 22 18  Temp: 97.8 F (36.6 C) 98.5 F (36.9 C) 98.1 F (36.7 C) 97.6 F (36.4 C)  TempSrc: Oral Oral Oral Oral  SpO2: 94% 95% 94% 100%  Weight:   82 kg   Height:      PainSc:        Intake/Output Summary (Last 24 hours) at 11/13/2019 7829 Last data filed at 11/13/2019 0600 Gross per 24 hour  Intake 1253.44 ml  Output 1200 ml  Net 53.44 ml   Filed Weights   11/10/19 0448 11/12/19 0632 11/13/19 0433  Weight: 82.8 kg 79.3 kg 82 kg   Weight change: 2.7 kg  Intake/Output from previous day: 09/23 0701 - 09/24 0700 In: 1253.4 [P.O.:360; I.V.:893.4] Out: 1200 [Urine:1200] Intake/Output this shift: No intake/output data recorded.  Examination: General exam: AAO, NAD, weak appearing. HEENT:Oral mucosa moist, Ear/Nose WNL grossly, dentition normal. Respiratory system: bilaterally clear,no wheezing or crackles,no use of accessory muscle Cardiovascular system: S1 & S2 +, No JVD,. Gastrointestinal system: Abdomen soft, NT,ND, BS+ Nervous System:Alert, awake, moving extremities and grossly nonfocal Extremities: Right BKA wound healing, left BKA dressing intact and did not remove. No edema, distal peripheral pulses palpable.  Skin: No rashes,no icterus. MSK: Normal muscle bulk,tone, power  Data Reviewed: I have personally reviewed following labs and imaging studies CBC: Recent Labs  Lab 11/07/19 1001 11/07/19 1001 11/08/19 0802 11/11/19 1747 11/12/19 0622 11/12/19 1601 11/13/19 0633  WBC 10.3  --  10.4 13.2* 12.0*  --  8.3  NEUTROABS 7.9*  --  6.6  --   --   --    --  HGB 8.3*   < > 8.5* 7.4* 7.2* 7.1* 7.9*  HCT 27.0*   < > 27.8* 24.7* 25.0* 24.4* 26.9*  MCV 83.9  --  83.2 85.5 86.2  --  86.2  PLT 681*  --  675* 568* 580*  --  542*   < > = values in this interval not displayed.   Basic Metabolic Panel: Recent Labs  Lab 11/08/19 0802 11/08/19 0802 11/09/19 0306 11/10/19 0323 11/11/19 1747 11/12/19 0622 11/13/19 0633  NA 133*   < > 138 138 137 138 136  K 3.4*   < > 4.3 4.1 4.5 4.3 4.1  CL 107   < > 112* 111 108 107 106  CO2 17*   < > 18* 21* 21* 22 20*  GLUCOSE 125*   < > 94 93 222* 81 79  BUN 11   < > 9 9 9 10 10   CREATININE 0.97   < > 1.02 1.03 0.90 0.95 0.93  CALCIUM 7.9*   < > 8.2* 8.0* 7.7* 7.8* 7.9*  MG 1.6*  --  2.0  --   --   --   --    < > = values in this interval not displayed.   GFR: Estimated Creatinine Clearance: 57.2 mL/min (by C-G formula based on SCr of 0.93 mg/dL). Liver Function Tests: Recent Labs  Lab 11/08/19 0802 11/09/19 0306 11/10/19 0323 11/11/19 1747  AST 18 18 19 16   ALT 17 19 17 14   ALKPHOS 124 131* 122 115  BILITOT 0.5 0.3 0.5 0.4  PROT 6.0* 6.2* 6.0* 5.2*  ALBUMIN 1.9* 2.2* 2.0* 1.9*   No results for input(s): LIPASE, AMYLASE in the last 168 hours. No results for input(s): AMMONIA in the last 168 hours. Coagulation Profile: No results for input(s): INR, PROTIME in the last 168 hours. Cardiac Enzymes: Recent Labs  Lab 11/07/19 1001 11/13/19 0633  CKTOTAL 68 461*   BNP (last 3 results) No results for input(s): PROBNP in the last 8760 hours. HbA1C: No results for input(s): HGBA1C in the last 72 hours. CBG: Recent Labs  Lab 11/12/19 1700 11/12/19 2202 11/13/19 0313 11/13/19 0339 11/13/19 0801  GLUCAP 128* 121* 58* 83 88   Lipid Profile: Recent Labs    11/12/19 0622  CHOL 117  HDL 25*  LDLCALC 65  TRIG 137  CHOLHDL 4.7   Thyroid Function Tests: No results for input(s): TSH, T4TOTAL, FREET4, T3FREE, THYROIDAB in the last 72 hours. Anemia Panel: No results for input(s):  VITAMINB12, FOLATE, FERRITIN, TIBC, IRON, RETICCTPCT in the last 72 hours. Sepsis Labs: No results for input(s): PROCALCITON, LATICACIDVEN in the last 168 hours.  Recent Results (from the past 240 hour(s))  Urine culture     Status: Abnormal   Collection Time: 11/05/19 11:55 AM   Specimen: In/Out Cath Urine  Result Value Ref Range Status   Specimen Description   Final    IN/OUT CATH URINE Performed at [facility], [address] [place], [place] [address]    Special Requests   Final    NONE Performed at [facility], [address] [place], [place] [address]    Culture MULTIPLE SPECIES PRESENT, SUGGEST RECOLLECTION (A)  Final   Report Status 11/06/2019 FINAL  Final  Blood Culture (routine x 2)     Status: None   Collection Time: 11/05/19 12:03 PM   Specimen: BLOOD  Result Value Ref Range Status   Specimen Description BLOOD  Final   Special Requests NONE  Final  Culture   Final    NO GROWTH 5 DAYS Performed at [facility], [address] [place], [place] [address]    Report Status 11/10/2019 FINAL  Final  Blood Culture (routine x 2)     Status: None   Collection Time: 11/05/19 12:12 PM   Specimen: BLOOD  Result Value Ref Range Status   Specimen Description BLOOD  Final   Special Requests NONE  Final   Culture   Final    NO GROWTH 5 DAYS Performed at [facility], [address], [place], [place] [address]    Report Status 11/10/2019 FINAL  Final  SARS Coronavirus 2 by RT PCR (hospital order, performed in [facility] hospital lab) Nasopharyngeal Nasopharyngeal Swab     Status: None   Collection Time: 11/05/19 12:17 PM   Specimen: Nasopharyngeal Swab  Result Value Ref Range Status   SARS Coronavirus 2 NEGATIVE NEGATIVE Final    Comment: (NOTE) SARS-CoV-2 target nucleic acids are NOT DETECTED.  The SARS-CoV-2 RNA is generally detectable in upper and lower respiratory specimens during the acute phase of infection. The lowest concentration of SARS-CoV-2 viral copies  this assay can detect is 250 copies / mL. A negative result does not preclude SARS-CoV-2 infection and should not be used as the sole basis for treatment or other patient management decisions.  A negative result may occur with improper specimen collection / handling, submission of specimen other than nasopharyngeal swab, presence of viral mutation(s) within the areas targeted by this assay, and inadequate number of viral copies (<250 copies / mL). A negative result must be combined with clinical observations, patient history, and epidemiological information.  Fact Sheet for Patients:   StrictlyIdeas.no  Fact Sheet for Healthcare Providers: BankingDealers.co.za  This test is not yet approved or  cleared by the [place] FDA and has been authorized for detection and/or diagnosis of SARS-CoV-2 by FDA under an Emergency Use Authorization (EUA).  This EUA will remain in effect (meaning this test can be used) for the duration of the COVID-19 declaration under Section 564(b)(1) of the Act, 21 U.S.C. section 360bbb-3(b)(1), unless the authorization is terminated or revoked sooner.  Performed at [facility], [address], [place], [place] [address]      Radiology Studies: No results found.   LOS: 8 days   Antonieta Pert, MD [facility]  11/13/2019, 8:22 AM

## 2019-11-13 NOTE — Progress Notes (Signed)
Occupational Therapy Treatment Patient Details Name: Anthony Dunlap MRN: [id number] DOB: 24-Nov-1933 Today's Date: 11/13/2019    History of present illness Pt is an 86 y.o. male admitted from SNF on 11/05/19 with sepsis, nonhealing L foot ulcer, afib with RVR. 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  Post L BKA 9/22.   OT comments  Patient and family have agreed to pursue inpatient CIR rehab.  Patient absolutely needs post acute rehab, and will be an excellent candidate based on participation and ability to move in the acute setting.  Patient continues to present with generalized weakness, pain to the residual limb, decreased activity tolerance and assist with edge of bed balance.  Patient able to improve rolling and bed mobility this date.  Able to progress him to edge of bed grooming and upper body ADL.  OT will continue to follow the patient in the acute setting to maximize functional status.  CIR is recommended based on patient's motivation and ability to regain independence at wheelchair level.    Follow Up Recommendations  CIR;Supervision/Assistance - 24 hour    Equipment Recommendations  3 in 1 bedside commode;Wheelchair (measurements OT);Wheelchair cushion (measurements OT);Hospital bed    Recommendations for Other Services      Precautions / Restrictions Precautions Precautions: Fall Precaution Comments: R BKA, s/p 9/22 L BKA Restrictions Weight Bearing Restrictions: Yes RLE Weight Bearing: Non weight bearing LLE Weight Bearing: Non weight bearing       Mobility Bed Mobility Overal bed mobility: Needs Assistance Bed Mobility: Rolling Rolling: Min assist   Supine to sit: Mod assist Sit to supine: Mod assist      Transfers                      Balance Overall balance assessment: Needs assistance Sitting-balance support: Feet  unsupported;Bilateral upper extremity supported Sitting balance-Leahy Scale: Fair Sitting balance - Comments: able to eventually sit EOB without upper body support to groom and wash UB.                                   ADL either performed or assessed with clinical judgement   ADL       Grooming: Wash/dry hands;Sitting;Min guard;Wash/dry face Grooming Details (indicate cue type and reason): sat EOB with min Guard Upper Body Bathing: Minimal assistance;Sitting   Lower Body Bathing: Maximal assistance;Bed level   Upper Body Dressing : Minimal assistance Upper Body Dressing Details (indicate cue type and reason): sat EOB with MinA and Min Guard                                           Cognition Arousal/Alertness: Awake/alert Behavior During Therapy: WFL for tasks assessed/performed Overall Cognitive Status: Impaired/Different from baseline                                 General Comments: patient follows commands well, increased processing time for mutil step commands and VC's for safety.        Exercises                  Pertinent Vitals/ Pain  Pain Assessment: 0-10 Pain Score: 3  Pain Location: L residual limb especially with movement  Pain Descriptors / Indicators: Throbbing;Pins and needles Pain Intervention(s): Monitored during session;Repositioned                                                          Frequency  Min 2X/week        Progress Toward Goals  OT Goals(current goals can now be found in the care plan section)     Acute Rehab OT Goals Patient Stated Goal: Figure out how to do things from the wheelchair OT Goal Formulation: With patient Time For Goal Achievement: 11/21/19 Potential to Achieve Goals: Good  Plan      Co-evaluation                 AM-PAC OT "6 Clicks" Daily Activity     Outcome Measure   Help from another person eating meals?:  None Help from another person taking care of personal grooming?: A Little Help from another person toileting, which includes using toliet, bedpan, or urinal?: A Lot Help from another person bathing (including washing, rinsing, drying)?: A Lot Help from another person to put on and taking off regular upper body clothing?: A Little Help from another person to put on and taking off regular lower body clothing?: A Lot 6 Click Score: 16    End of Session Equipment Utilized During Treatment: Oxygen  OT Visit Diagnosis: Muscle weakness (generalized) (M62.81);Pain;Other symptoms and signs involving cognitive function Pain - Right/Left: Left Pain - part of body: Leg   Activity Tolerance Patient tolerated treatment well   Patient Left in bed;with call bell/phone within reach;with bed alarm set   Nurse Communication Other (comment) (nurse tech in room for a moment.)        Time: 0354-6568 OT Time Calculation (min): 17 min  Charges: OT General Charges $OT Visit: 1 Visit OT Treatments $Self Care/Home Management : 8-22 mins  11/13/2019  Rich, OTR/L  Acute Rehabilitation Services  Office:  [phone]    Metta Clines 11/13/2019, 11:42 AM

## 2019-11-13 NOTE — Progress Notes (Signed)
Orthopedic Tech Progress Note Patient Details:  Anthony Dunlap 22-Jun-1933 [id number] Called in brace Patient ID: Anthony Dunlap, male   DOB: 11-Jul-1933, 86 y.o.   MRN: [id number]   [name] 11/13/2019, 1:25 PM

## 2019-11-13 NOTE — Progress Notes (Signed)
EKG reviewed, confirms NSR with nonspecific STT changes. Pt was asymptomatic from cardiac standpoint today. Reviewed with MD. No change in plan. See rounding note from today. Kitai Purdom PA-C

## 2019-11-13 NOTE — Care Management (Signed)
Awaiting insurance authorization prior to admission to CIR

## 2019-11-13 NOTE — Progress Notes (Addendum)
Progress Note  Patient Name: Anthony Dunlap Date of Encounter: 11/13/2019  Primary Cardiologist: Carlyle Dolly, MD  Subjective   Feeling good, no complaints. Denies any new cardiac symptoms. Tele shows NSR, converted yesterday around 12:30pm, pt unaware.  Inpatient Medications    Scheduled Meds: . sodium chloride   Intravenous Once  . sodium chloride   Intravenous Once  . amitriptyline  20 mg Oral QHS  . aspirin EC  81 mg Oral Q breakfast  . Chlorhexidine Gluconate Cloth  6 each Topical Daily  . collagenase   Topical Daily  . diltiazem  240 mg Oral Daily  . docusate sodium  100 mg Oral Daily  . ferrous sulfate  325 mg Oral Q breakfast  . heparin  5,000 Units Subcutaneous Q8H  . insulin aspart  0-5 Units Subcutaneous QHS  . insulin aspart  0-9 Units Subcutaneous TID WC  . insulin glargine  4 Units Subcutaneous QHS  . levothyroxine  100 mcg Oral Q breakfast  . metoprolol succinate  50 mg Oral Daily  . multivitamin with minerals  1 tablet Oral Daily  . oxybutynin  5 mg Oral QHS  . pantoprazole (PROTONIX) IV  40 mg Intravenous QHS  . polyethylene glycol  17 g Oral Daily  . pravastatin  10 mg Oral Q supper  . primidone  150 mg Oral QHS  . tobramycin  2 drop Both Eyes Q4H while awake   Continuous Infusions: . sodium chloride 75 mL/hr at 11/12/19 2228  . magnesium sulfate bolus IVPB     PRN Meds: acetaminophen **OR** acetaminophen, ALPRAZolam, alum & mag hydroxide-simeth, colchicine, guaiFENesin-dextromethorphan, hydrALAZINE, labetalol, liver oil-zinc oxide, magnesium sulfate bolus IVPB, metoprolol tartrate, morphine injection, ondansetron, oxyCODONE-acetaminophen, phenol, potassium chloride   Vital Signs    Vitals:   11/12/19 2034 11/13/19 0033 11/13/19 0433 11/13/19 0736  BP: 122/77 130/71 117/69 137/79  Pulse: 87 86 88 95  Resp: (!) 22 18 (!) 22 18  Temp: 97.8 F (36.6 C) 98.5 F (36.9 C) 98.1 F (36.7 C) 97.6 F (36.4 C)  TempSrc: Oral Oral Oral Oral  SpO2:  94% 95% 94% 100%  Weight:   82 kg   Height:        Intake/Output Summary (Last 24 hours) at 11/13/2019 0914 Last data filed at 11/13/2019 0600 Gross per 24 hour  Intake 1253.44 ml  Output 1200 ml  Net 53.44 ml   Last 3 Weights 11/13/2019 11/12/2019 11/10/2019  Weight (lbs) 180 lb 12.4 oz 174 lb 13.2 oz 182 lb 8 oz  Weight (kg) 82 kg 79.3 kg 82.781 kg     Telemetry    NSR - Personally Reviewed  Physical Exam   GEN: No acute distress.  HEENT: Normocephalic, atraumatic, sclera non-icteric. Neck: No JVD or bruits. Cardiac: RRR no murmurs, rubs, or gallops.  Respiratory: Clear to auscultation bilaterally. Breathing is unlabored. GI: Rounded with slight distention, BS +x 4. MS: see extremities below Extremities: No clubbing or cyanosis. 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 Follows commands. Psych:  Responds to questions appropriately with a normal affect.  Labs    High Sensitivity Troponin:  No results for input(s): TROPONINIHS in the last 720 hours.    Cardiac EnzymesNo results for input(s): TROPONINI in the last 168 hours. No results for input(s): TROPIPOC in the last 168 hours.   Chemistry Recent Labs  Lab 11/09/19 3818 11/09/19 2993 11/10/19 7169 11/10/19 6789 11/11/19 1747 11/12/19 0622 11/13/19 3810  NA 138   < > 138   < > 137 138 136  K 4.3   < > 4.1   < > 4.5 4.3 4.1  CL 112*   < > 111   < > 108 107 106  CO2 18*   < > 21*   < > 21* 22 20*  GLUCOSE 94   < > 93   < > 222* 81 79  BUN 9   < > 9   < > 9 10 10   CREATININE 1.02   < > 1.03   < > 0.90 0.95 0.93  CALCIUM 8.2*   < > 8.0*   < > 7.7* 7.8* 7.9*  PROT 6.2*  --  6.0*  --  5.2*  --   --   ALBUMIN 2.2*  --  2.0*  --  1.9*  --   --   AST 18  --  19  --  16  --   --   ALT 19  --  17  --  14  --   --   ALKPHOS 131*  --  122  --  115  --   --   BILITOT 0.3  --  0.5  --  0.4  --   --   GFRNONAA >60   < > >60   < > >60 >60 >60  GFRAA >60   < > >60   < > >60 >60 >60  ANIONGAP 8    < > 6   < > 8 9 10    < > = values in this interval not displayed.     Hematology Recent Labs  Lab 11/11/19 1747 11/11/19 1747 11/12/19 0622 11/12/19 1601 11/13/19 0633  WBC 13.2*  --  12.0*  --  8.3  RBC 2.89*  --  2.90*  --  3.12*  HGB 7.4*   < > 7.2* 7.1* 7.9*  HCT 24.7*   < > 25.0* 24.4* 26.9*  MCV 85.5  --  86.2  --  86.2  MCH 25.6*  --  24.8*  --  25.3*  MCHC 30.0  --  28.8*  --  29.4*  RDW 16.4*  --  16.5*  --  16.4*  PLT 568*  --  580*  --  542*   < > = values in this interval not displayed.    BNPNo results for input(s): BNP, PROBNP in the last 168 hours.   DDimer No results for input(s): DDIMER in the last 168 hours.   Radiology    No results found.  Cardiac Studies   2D Echo 11/06/19 IMPRESSIONS  1. Left ventricular ejection fraction, by estimation, is 60 to 65%. The  left ventricle has normal function. Left ventricular endocardial border  not optimally defined to evaluate regional wall motion. There is mild left  ventricular hypertrophy. Left  ventricular diastolic parameters are indeterminate.  2. Right ventricular systolic function is normal. The right ventricular  size is normal.  3. Left atrial size was mildly dilated.  4. The pericardial effusion is circumferential.  5. The mitral valve is normal in structure. Trivial mitral valve  regurgitation. No evidence of mitral stenosis.  6. The aortic valve was not well visualized. Aortic valve regurgitation  is not visualized. No aortic stenosis is present.   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 Hewas admitted9/6/21 with  sepsis and left heal pressure ulcer with underlying calcaneal osteomyelitis, also found to have worsening anemia requiring transfusion. His wife of 20 years died recently. Cardiology following for atrial fibrillation. He was on  DAPT prior to admission.  Assessment & Plan    1.Sepsis, osteomyelitis L heelin context of PAD -being managed by IM, ID, and vascular surgery, likely contributing to development of #2 - s/p left BKA 11/11/19 - consider titration of statin  2.Newly recognized atrial fibrillation with RVR  -diltiazem/metoprolol consolidated yesterday -> converted to NSR so will obtain f/u EKG to document - anticoag on hold given recent melena/anemia and also post-op status - was also on DAPT prior to admission with ASA/Plavix given PAD - per Dr. Debara Pickett, await vascular surgery recs as to timing to start post-op anticoagulation  3. Pericardial effusion - small by echo, no further workup at this time  4. Anemia - transfused for Hgb 6.9 s/p transfusion - EGD 09/20 was normal, does not seem like colonoscopy is planned - hospitalist notes indicate that GI gave OK to restart anticoag for atrial fib when warranted (was on hold due to vascular surgery) - post-op Hgb down to 7.1 yesterday - do not see transfusion but f/u value 7.9 this AM - per primary teams  For questions or updates, please contact [place] Please consult [email or web address] for contact info under Cardiology/STEMI.  Signed, Charlie Pitter, PA-C 11/13/2019, 9:14 AM    Pt. Seen and examined. Agree with the Resident/NP/PA-C note as written. Converted to sinus rhythm overnight - will defer to vascular surgery, but likely recommend combination Plavix and NOAC (Eliquis, d/t lower GIB risk) at discharge d/t afib. No further suggestions at this time. Can follow-up with Dr. Harl Bowie.  [facility] will sign off.   Medication Recommendations:  As above Other recommendations (labs, testing, etc):  none Follow up as an outpatient:  Dr. Harl Bowie  Time Spent with Patient: I have spent a total of 25 minutes with patient reviewing hospital notes, telemetry, EKGs, labs and examining the patient as well as establishing an assessment and plan that was  discussed with the patient. > 50% of time was spent in direct patient care.  Pixie Casino, MD, [facility], [place] Director of the [facility] Lipid Disorders &  Cardiovascular Risk Reduction Clinic Diplomate of the American Board of Clinical Lipidology Attending Cardiologist  Direct Dial: [phone]  Fax: [phone]  Website:  [email or web address]

## 2019-11-13 NOTE — Progress Notes (Signed)
Inpatient Rehab Admissions Coordinator:   I was able to speak to patient's daughter, [name], this morning.  Family is extremely supportive of CIR admit and are already working to ensure pt has adequate support at discharge.  We discussed that with bilateral amputations, pt would be w/c level, and would potentially need some physical assist with transfers and ADLs.  [name] states this will not be a problem, that between several children and hired caregivers he will always have someone with him to help.  I will begin insurance authorization this morning for potential CIR admit later today pending approval.    Shann Medal, PT, DPT Admissions Coordinator [phone] 11/13/19  10:24 AM

## 2019-11-13 NOTE — Progress Notes (Signed)
Inpatient Rehab Admissions Coordinator:   Have not heard from insurance regarding request for prior authorization.  We will continue to follow for possible admission pending insurance determination.   Shann Medal, PT, DPT Admissions Coordinator [phone] 11/13/19  3:52 PM

## 2019-11-13 NOTE — Progress Notes (Signed)
Hypoglycemic Event  CBG:58  Treatment: 4 oz juice/soda  Symptoms: Hungry  Follow-up CBG: [id number] CBG Result:=83  Possible Reasons for Event: Inadequate meal intake  Comments/MD notified:MD on call    Danaiya Steadman, [employer]

## 2019-11-13 NOTE — TOC Transition Note (Signed)
Transition of Care [facility]) - CM/SW Discharge Note   Patient Details  Name: Anthony Dunlap MRN: [id number] Date of Birth: 1933/03/31  [facility] CM/SW Contact:  Hyman Hopes, RN Phone Number: 11/13/2019, 11:59 AM   Clinical Narrative:   High risk readmission assessment complete. Case manager spoke to patient in regards to high risk for readmission and he reports no issues obtaining, taking or affording medications, transportation to doctor's office is done by friend or family member and denies any other issues.  According to notes will go to CIR prior to discharge to home.   Final next level of care: IP Rehab Facility Barriers to Discharge: No Barriers Identified   Patient Goals and CMS Choice Patient states their goals for this hospitalization and ongoing recovery are:: to go home after completion of CIR CMS Medicare.gov Compare Post Acute Care list provided to:: Patient Represenative (must comment) [name]) Choice offered to / list presented to : Adult Children [name])   Discharge Plan and Services   Discharge Planning Services: CM Consult Post Acute Care Choice: IP Rehab          DME Arranged:  (Has hospital bed, slide board, RW, 3in1) DME Agency: [place]: [facility] Date [place]: 11/09/19 Time [place]: 16 Representative spoke with at [place]: Joen Laura   Readmission Risk Interventions Readmission Risk Prevention Plan 11/13/2019 09/29/2019 05/09/2018  Transportation Screening Complete Complete Complete  PCP or Specialist Appt within 5-7 Days - Not Complete -  Not Complete comments - plan for SNF -  PCP or Specialist Appt within 3-5 Days Complete - Complete  Home Care Screening - Complete -  Medication Review (RN CM) - Referral to [place] or [place] Complete - Complete  Social Work Consult for [place]  Planning/Counseling Complete - Complete  Palliative Care Screening Not Applicable - Not Applicable  Medication Review Press photographer) Complete - Complete  Some recent data might be hidden

## 2019-11-14 LAB — BASIC METABOLIC PANEL
Anion gap: 11 (ref 5–15)
BUN: 9 mg/dL (ref 8–23)
CO2: 22 mmol/L (ref 22–32)
Calcium: 8 mg/dL — ABNORMAL LOW (ref 8.9–10.3)
Chloride: 103 mmol/L (ref 98–111)
Creatinine, Ser: 0.94 mg/dL (ref 0.61–1.24)
GFR calc Af Amer: >60 mL/min (ref >60)
GFR calc non Af Amer: >60 mL/min (ref >60)
Glucose, Bld: 132 mg/dL — ABNORMAL HIGH (ref 70–99)
Potassium: 3.9 mmol/L (ref 3.5–5.1)
Sodium: 136 mmol/L (ref 135–145)

## 2019-11-14 LAB — CBC WITH DIFFERENTIAL/PLATELET
Abs Immature Granulocytes: 0.07 10*3/uL (ref 0.00–0.07)
Basophils Absolute: 0 10*3/uL (ref 0.0–0.1)
Basophils Relative: 1 %
Eosinophils Absolute: 0.4 10*3/uL (ref 0.0–0.5)
Eosinophils Relative: 5 %
HCT: 26.6 % — ABNORMAL LOW (ref 39.0–52.0)
Hemoglobin: 8 g/dL — ABNORMAL LOW (ref 13.0–17.0)
Immature Granulocytes: 1 %
Lymphocytes Relative: 15 %
Lymphs Abs: 1.2 10*3/uL (ref 0.7–4.0)
MCH: 25.6 pg — ABNORMAL LOW (ref 26.0–34.0)
MCHC: 30.1 g/dL (ref 30.0–36.0)
MCV: 85.3 fL (ref 80.0–100.0)
Monocytes Absolute: 0.6 10*3/uL (ref 0.1–1.0)
Monocytes Relative: 7 %
Neutro Abs: 5.6 10*3/uL (ref 1.7–7.7)
Neutrophils Relative %: 71 %
Platelets: 607 10*3/uL — ABNORMAL HIGH (ref 150–400)
RBC: 3.12 MIL/uL — ABNORMAL LOW (ref 4.22–5.81)
RDW: 16.1 % — ABNORMAL HIGH (ref 11.5–15.5)
WBC: 7.8 10*3/uL (ref 4.0–10.5)
nRBC: 0 % (ref 0.0–0.2)

## 2019-11-14 LAB — GLUCOSE, CAPILLARY
Glucose-Capillary: 132 mg/dL — ABNORMAL HIGH (ref 70–99)
Glucose-Capillary: 134 mg/dL — ABNORMAL HIGH (ref 70–99)
Glucose-Capillary: 197 mg/dL — ABNORMAL HIGH (ref 70–99)
Glucose-Capillary: 200 mg/dL — ABNORMAL HIGH (ref 70–99)
Glucose-Capillary: 226 mg/dL — ABNORMAL HIGH (ref 70–99)

## 2019-11-14 NOTE — Progress Notes (Signed)
PROGRESS NOTE    Anthony Dunlap  NGE:[id number] DOB: May 09, 1933 DOA: 11/05/2019 PCP: [name], MD   Chief Complaint  Patient presents with   possible sepsis   Brief Narrative: 86 year old with DM2, prostate cancer, severe PVD followed by [name] status post right BKA and history of hip osteomyelitis on chronic suppressive antibiotic therapy was admitted from SNFfor fever and new onset A. fib with RVR. On admission patient was hemodynamically stable but his hemoglobin was noted to be markedly decreased from last month. History per patient's daughter who is his caregiver noted that he had been having black stool for a week prior to that. Patient was not started on anticoagulation anticoagulation for his atrial fibrillation. Patient was transfused 1 unit PRBC. Patient was seen by cardiology after being placed on a Cardizem drip. Cardizem was discontinued and patient was placed on Lopressor with reasonable control of his heart rate. Patient was seen by [name] who also follows him as an outpatient. He underwent an MRI which revealed osteomyelitis of his left heel. Skin surgery was called to consult regarding blood flow. Patient underwent EGD on 11/09/2019 which was unrevealing. Vascular has advised amputation for left heel wound/osteo and he agreed for surgery on 9/22 And underwent left BKA Postop did well.  Antibiotics were discontinued as per ID recommendation.  Pain is stable.  Seen by PT OT at this time plan is for inpatient rehabilitation  Subjective: Alert,awake, resting, left bka stump open staples intact, dry Afebrile overnight. Labs pending No hypoglycemic events.  Assessment & Plan:  Left heel pressure ulcer with underlying calcaneal osteomyelitis present on admission: Appreciate ID and Dr. Nona Dell inputs-He has been on Dapto/Unasyn and now s/p left BKA 9/22, per ID recommendation antibiotics were discontinued. Continue local wound care of left BKA pain control and PT OT and  CIR placement.  Sepsis POA 2/2 #1:as evident by fever, tachypnea, tachycardia and lactic acidosis. Sepsis resolved. Off antibiotics.  A. fib with RVR: Sinus rhythm, on Cardizem to 40 mg and Toprol 50 mg seen by cardiology defer anticoagulation to cardiology , per vascular okay to start Eliquis. H/h pending today.  Anemia of chronic disease with reported melena S/P EGD 9/20 was normal.  Hemoglobin overall stable. anemia at 7.2 likely from postop on anemia of chronic disease. hb up at 7.9 gm now. Continue PPI. Seen b yGI and had EGD. Recent Labs  Lab 11/08/19 0802 11/11/19 1747 11/12/19 0622 11/12/19 1601 11/13/19 0633  HGB 8.5* 7.4* 7.2* 7.1* 7.9*  HCT 27.8* 24.7* 25.0* 24.4* 26.9*   Pericardial effusion, small: No further work-up per cardiology  Hypokalemia/hypomagnesemia: Improved  PVD/Lt MWN:UUVO calcaneal osteomyelitis.  DM2:uncontrolled hba1c at 8.5. No more hypoglycemic event continue on decreased dose of Lantus at 4 units  And ssi. 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 Cont ditropan as well.  DVT prophylaxis: heparin injection 5,000 Units Start: 11/12/19 0600 SCD's Start: 11/11/19 1446 SCDs Start: 11/05/19  2016 Code Status:   Code Status: Full Code  Family Communication: plan of care discussed with patient at bedside. I have discussed patient's daughter previously no family at bedside today. Status is:  Inpatient Remains inpatient appropriate because:Inpatient level of care appropriate due to severity of illness  Dispo: The patient is from: Home/SNF              Anticipated d/c is to: CIR              Anticipated d/c date is: Once bed available              Patient currently is not medically stable to d/c. Nutrition: Diet Order            Diet heart healthy/carb modified Room service appropriate? Yes; Fluid consistency: Thin  Diet effective now                Body mass index is 26.62 kg/m. Pressure Ulcer: Pressure Injury 11/09/19 Buttocks Right;Proximal Stage 2 -  Partial thickness loss of dermis presenting as a shallow open injury with a red, pink wound bed without slough. (Active)  11/09/19 1915  Location: Buttocks  Location Orientation: Right;Proximal  Staging: Stage 2 -  Partial thickness loss of dermis presenting as a shallow open injury with a red, pink wound bed without slough.  Wound Description (Comments):   Present on Admission:    Consultants:see note  Procedures:see note Microbiology:see note Blood Culture    Component Value Date/Time   SDES BLOOD 11/05/2019 1212   SPECREQUEST NONE 11/05/2019 1212   CULT  11/05/2019 1212    NO GROWTH 5 DAYS Performed at [facility], [address], [place], [place] [address]    REPTSTATUS 11/10/2019 FINAL 11/05/2019 1212    Other culture-see note  Medications: Scheduled Meds:  sodium chloride   Intravenous Once   sodium chloride   Intravenous Once   amitriptyline  20 mg Oral QHS   aspirin EC  81 mg Oral Q breakfast   Chlorhexidine Gluconate Cloth  6 each Topical Daily   diltiazem  240 mg Oral Daily   docusate sodium  100 mg Oral Daily   ferrous sulfate  325 mg Oral Q breakfast   heparin  5,000 Units Subcutaneous Q8H   insulin aspart  0-5 Units Subcutaneous QHS   insulin aspart  0-9 Units Subcutaneous TID WC   insulin glargine  4 Units Subcutaneous QHS   levothyroxine  100 mcg Oral Q breakfast    metoprolol succinate  50 mg Oral Daily   multivitamin with minerals  1 tablet Oral Daily   oxybutynin  5 mg Oral QHS   pantoprazole (PROTONIX) IV  40 mg Intravenous QHS   polyethylene glycol  17 g Oral Daily   pravastatin  10 mg Oral Q supper   primidone  150 mg Oral QHS   tobramycin  2 drop Both Eyes Q4H while awake   Continuous Infusions:  sodium chloride 75 mL/hr at 11/14/19 9675   magnesium sulfate bolus IVPB      Antimicrobials: Anti-infectives (From admission, onward)   Start     Dose/Rate Route Frequency Ordered Stop   11/11/19 1500  ceFAZolin (ANCEF) IVPB 2g/100 mL premix  Status:  Discontinued        2 g 200 mL/hr over 30 Minutes Intravenous Every 8 hours 11/11/19 1446 11/11/19 1513   11/07/19 1200  DAPTOmycin (CUBICIN) 700 mg in sodium chloride 0.9 % IVPB  Status:  Discontinued  700 mg 228 mL/hr over 30 Minutes Intravenous Daily 11/07/19 1056 11/12/19 1141   11/07/19 1200  Ampicillin-Sulbactam (UNASYN) 3 g in sodium chloride 0.9 % 100 mL IVPB  Status:  Discontinued        3 g 200 mL/hr over 30 Minutes Intravenous Every 6 hours 11/07/19 1056 11/12/19 1141   11/06/19 1300  vancomycin (VANCOREADY) IVPB 1250 mg/250 mL  Status:  Discontinued        1,250 mg 166.7 mL/hr over 90 Minutes Intravenous Every 24 hours 11/05/19 1324 11/07/19 1030   11/06/19 0000  ceFEPIme (MAXIPIME) 2 g in sodium chloride 0.9 % 100 mL IVPB  Status:  Discontinued        2 g 200 mL/hr over 30 Minutes Intravenous Every 12 hours 11/05/19 1648 11/07/19 1030   11/05/19 2200  metroNIDAZOLE (FLAGYL) IVPB 500 mg  Status:  Discontinued        500 mg 100 mL/hr over 60 Minutes Intravenous Every 8 hours 11/05/19 1637 11/07/19 1030   11/05/19 1300  vancomycin (VANCOREADY) IVPB 2000 mg/400 mL        2,000 mg 200 mL/hr over 120 Minutes Intravenous  Once 11/05/19 1226 11/05/19 1453   11/05/19 1230  metroNIDAZOLE (FLAGYL) IVPB 500 mg        500 mg 100 mL/hr over 60 Minutes Intravenous  Once 11/05/19  1223 11/05/19 1345   11/05/19 1215  ceFEPIme (MAXIPIME) 2 g in sodium chloride 0.9 % 100 mL IVPB        2 g 200 mL/hr over 30 Minutes Intravenous  Once 11/05/19 1201 11/05/19 1254     Objective: Vitals: Today's Vitals   11/13/19 2017 11/14/19 0021 11/14/19 0408 11/14/19 0823  BP:  130/68 120/77 (!) 153/90  Pulse:    (!) 105  Resp:  18 18 18   Temp:  98 F (36.7 C) 98 F (36.7 C) 97.8 F (36.6 C)  TempSrc:  Oral Oral Oral  SpO2:    91%  Weight:   79.4 kg   Height:      PainSc: 0-No pain       Intake/Output Summary (Last 24 hours) at 11/14/2019 1017 Last data filed at 11/14/2019 0300 Gross per 24 hour  Intake --  Output 2800 ml  Net -2800 ml   Filed Weights   11/12/19 0632 11/13/19 0433 11/14/19 0408  Weight: 79.3 kg 82 kg 79.4 kg   Weight change: -2.6 kg  Intake/Output from previous day: 09/24 0701 - 09/25 0700 In: -  Out: 2800 [Urine:2800] Intake/Output this shift: No intake/output data recorded.  Examination:  General exam: AAO to place, people, situation but mildly confused abt month/year HEENT:Oral mucosa moist, Ear/Nose WNL grossly, dentition normal. Respiratory system: bilaterally clear,no wheezing or crackles,no use of accessory muscle Cardiovascular system: S1 & S2 +, No JVD,. Gastrointestinal system: Abdomen soft, NT,ND, BS+ Nervous System:Alert, awake, moving extremities and grossly nonfocal Extremities: No edema, b/l BKA-left bka stump open staples intact, dry. Rt bka stump healing Skin: No rashes,no icterus. MSK: Normal muscle bulk,tone, power   Data Reviewed: I have personally reviewed following labs and imaging studies CBC: Recent Labs  Lab 11/08/19 0802 11/11/19 1747 11/12/19 0622 11/12/19 1601 11/13/19 0633  WBC 10.4 13.2* 12.0*  --  8.3  NEUTROABS 6.6  --   --   --   --   HGB 8.5* 7.4* 7.2* 7.1* 7.9*  HCT 27.8* 24.7* 25.0* 24.4* 26.9*  MCV 83.2 85.5 86.2  --  86.2  PLT 675* 568* 580*  --  469*   Basic Metabolic Panel: Recent Labs   Lab 11/08/19 0802 11/08/19 0802 11/09/19 0306 11/09/19 0306 11/10/19 0323 11/11/19 1747 11/12/19 0622 11/13/19 0633 11/14/19 0840  NA 133*   < > 138   < > 138 137 138 136 136  K 3.4*   < > 4.3   < > 4.1 4.5 4.3 4.1 3.9  CL 107   < > 112*   < > 111 108 107 106 103  CO2 17*   < > 18*   < > 21* 21* 22 20* 22  GLUCOSE 125*   < > 94   < > 93 222* 81 79 132*  BUN 11   < > 9   < > 9 9 10 10 9   CREATININE 0.97   < > 1.02   < > 1.03 0.90 0.95 0.93 0.94  CALCIUM 7.9*   < > 8.2*   < > 8.0* 7.7* 7.8* 7.9* 8.0*  MG 1.6*  --  2.0  --   --   --   --   --   --    < > = values in this interval not displayed.   GFR: Estimated Creatinine Clearance: 56.6 mL/min (by C-G formula based on SCr of 0.94 mg/dL). Liver Function Tests: Recent Labs  Lab 11/08/19 0802 11/09/19 0306 11/10/19 0323 11/11/19 1747  AST 18 18 19 16   ALT 17 19 17 14   ALKPHOS 124 131* 122 115  BILITOT 0.5 0.3 0.5 0.4  PROT 6.0* 6.2* 6.0* 5.2*  ALBUMIN 1.9* 2.2* 2.0* 1.9*   No results for input(s): LIPASE, AMYLASE in the last 168 hours. No results for input(s): AMMONIA in the last 168 hours. Coagulation Profile: No results for input(s): INR, PROTIME in the last 168 hours. Cardiac Enzymes: Recent Labs  Lab 11/13/19 0633  CKTOTAL 461*   BNP (last 3 results) No results for input(s): PROBNP in the last 8760 hours. HbA1C: No results for input(s): HGBA1C in the last 72 hours. CBG: Recent Labs  Lab 11/13/19 1136 11/13/19 1718 11/13/19 2037 11/14/19 0411 11/14/19 0823  GLUCAP 135* 116* 178* 132* 134*   Lipid Profile: Recent Labs    11/12/19 0622  CHOL 117  HDL 25*  LDLCALC 65  TRIG 137  CHOLHDL 4.7   Thyroid Function Tests: No results for input(s): TSH, T4TOTAL, FREET4, T3FREE, THYROIDAB in the last 72 hours. Anemia Panel: No results for input(s): VITAMINB12, FOLATE, FERRITIN, TIBC, IRON, RETICCTPCT in the last 72 hours. Sepsis Labs: No results for input(s): PROCALCITON, LATICACIDVEN in the last 168  hours.  Recent Results (from the past 240 hour(s))  Urine culture     Status: Abnormal   Collection Time: 11/05/19 11:55 AM   Specimen: In/Out Cath Urine  Result Value Ref Range Status   Specimen Description   Final    IN/OUT CATH URINE Performed at [facility], [address] [place], [place] [address]    Special Requests   Final    NONE Performed at [facility], [address], [place], [place] [address]    Culture MULTIPLE SPECIES PRESENT, SUGGEST RECOLLECTION (A)  Final   Report Status 11/06/2019 FINAL  Final  Blood Culture (routine x 2)     Status: None   Collection Time: 11/05/19 12:03 PM   Specimen: BLOOD  Result Value Ref Range Status   Specimen Description BLOOD  Final   Special Requests NONE  Final   Culture   Final    NO GROWTH 5 DAYS  Performed at [facility], [address] [place], [place] [address]    Report Status 11/10/2019 FINAL  Final  Blood Culture (routine x 2)     Status: None   Collection Time: 11/05/19 12:12 PM   Specimen: BLOOD  Result Value Ref Range Status   Specimen Description BLOOD  Final   Special Requests NONE  Final   Culture   Final    NO GROWTH 5 DAYS Performed at [facility], [address], [place], [place] [address]    Report Status 11/10/2019 FINAL  Final  SARS Coronavirus 2 by RT PCR (hospital order, performed in [facility] hospital lab) Nasopharyngeal Nasopharyngeal Swab     Status: None   Collection Time: 11/05/19 12:17 PM   Specimen: Nasopharyngeal Swab  Result Value Ref Range Status   SARS Coronavirus 2 NEGATIVE NEGATIVE Final    Comment: (NOTE) SARS-CoV-2 target nucleic acids are NOT DETECTED.  The SARS-CoV-2 RNA is generally detectable in upper and lower respiratory specimens during the acute phase of infection. The lowest concentration of SARS-CoV-2 viral copies this assay can detect is 250 copies / mL. A negative result does not preclude SARS-CoV-2 infection and should not be used as the sole basis for treatment or  other patient management decisions.  A negative result may occur with improper specimen collection / handling, submission of specimen other than nasopharyngeal swab, presence of viral mutation(s) within the areas targeted by this assay, and inadequate number of viral copies (<250 copies / mL). A negative result must be combined with clinical observations, patient history, and epidemiological information.  Fact Sheet for Patients:   StrictlyIdeas.no  Fact Sheet for Healthcare Providers: BankingDealers.co.za  This test is not yet approved or  cleared by the [place] FDA and has been authorized for detection and/or diagnosis of SARS-CoV-2 by FDA under an Emergency Use Authorization (EUA).  This EUA will remain in effect (meaning this test can be used) for the duration of the COVID-19 declaration under Section 564(b)(1) of the Act, 21 U.S.C. section 360bbb-3(b)(1), unless the authorization is terminated or revoked sooner.  Performed at [facility], [address], [place], [place] [address]      Radiology Studies: No results found.   LOS: 9 days   Antonieta Pert, MD [facility]  11/14/2019, 10:17 AM

## 2019-11-14 NOTE — Plan of Care (Signed)
  Problem: Clinical Measurements: Goal: Ability to maintain clinical measurements within normal limits will improve Outcome: Progressing   

## 2019-11-14 NOTE — Progress Notes (Signed)
Progress Note  Patient Name: COLEN ELTZROTH Date of Encounter: 11/14/2019  Primary Cardiologist: Carlyle Dolly, MD   Subjective   No chest pain or sob. Feels better  Inpatient Medications    Scheduled Meds:  sodium chloride   Intravenous Once   sodium chloride   Intravenous Once   amitriptyline  20 mg Oral QHS   aspirin EC  81 mg Oral Q breakfast   Chlorhexidine Gluconate Cloth  6 each Topical Daily   diltiazem  240 mg Oral Daily   docusate sodium  100 mg Oral Daily   ferrous sulfate  325 mg Oral Q breakfast   heparin  5,000 Units Subcutaneous Q8H   insulin aspart  0-5 Units Subcutaneous QHS   insulin aspart  0-9 Units Subcutaneous TID WC   insulin glargine  4 Units Subcutaneous QHS   levothyroxine  100 mcg Oral Q breakfast   metoprolol succinate  50 mg Oral Daily   multivitamin with minerals  1 tablet Oral Daily   oxybutynin  5 mg Oral QHS   pantoprazole (PROTONIX) IV  40 mg Intravenous QHS   polyethylene glycol  17 g Oral Daily   pravastatin  10 mg Oral Q supper   primidone  150 mg Oral QHS   tobramycin  2 drop Both Eyes Q4H while awake   Continuous Infusions:  sodium chloride 75 mL/hr at 11/14/19 0227   magnesium sulfate bolus IVPB     PRN Meds: acetaminophen **OR** acetaminophen, ALPRAZolam, alum & mag hydroxide-simeth, colchicine, guaiFENesin-dextromethorphan, hydrALAZINE, labetalol, liver oil-zinc oxide, magnesium sulfate bolus IVPB, metoprolol tartrate, morphine injection, ondansetron, oxyCODONE-acetaminophen, phenol, potassium chloride   Vital Signs    Vitals:   11/13/19 1938 11/14/19 0021 11/14/19 0408 11/14/19 0823  BP:  130/68 120/77 (!) 153/90  Pulse:    (!) 105  Resp: 18 18 18 18   Temp: 98.2 F (36.8 C) 98 F (36.7 C) 98 F (36.7 C) 97.8 F (36.6 C)  TempSrc: Oral Oral Oral Oral  SpO2:    91%  Weight:   79.4 kg   Height:        Intake/Output Summary (Last 24 hours) at 11/14/2019 8657 Last data filed at 11/14/2019  0300 Gross per 24 hour  Intake --  Output 2800 ml  Net -2800 ml   Filed Weights   11/12/19 8469 11/13/19 0433 11/14/19 0408  Weight: 79.3 kg 82 kg 79.4 kg    Telemetry    nsr - Personally Reviewed  ECG    none - Personally Reviewed  Physical Exam   GEN: No acute distress.   Neck: No JVD Cardiac: RRR, no murmurs, rubs, or gallops.  Respiratory: Clear to auscultation bilaterally. GI: Soft, nontender, non-distended  MS: No edema; No deformity. S/p Bilateral BKA Neuro:  Nonfocal  Psych: blunted affect   Labs    Chemistry Recent Labs  Lab 11/09/19 0306 11/09/19 0306 11/10/19 0323 11/10/19 0323 11/11/19 1747 11/12/19 0622 11/13/19 0633  NA 138   < > 138   < > 137 138 136  K 4.3   < > 4.1   < > 4.5 4.3 4.1  CL 112*   < > 111   < > 108 107 106  CO2 18*   < > 21*   < > 21* 22 20*  GLUCOSE 94   < > 93   < > 222* 81 79  BUN 9   < > 9   < > 9 10 10   CREATININE 1.02   < >  1.03   < > 0.90 0.95 0.93  CALCIUM 8.2*   < > 8.0*   < > 7.7* 7.8* 7.9*  PROT 6.2*  --  6.0*  --  5.2*  --   --   ALBUMIN 2.2*  --  2.0*  --  1.9*  --   --   AST 18  --  19  --  16  --   --   ALT 19  --  17  --  14  --   --   ALKPHOS 131*  --  122  --  115  --   --   BILITOT 0.3  --  0.5  --  0.4  --   --   GFRNONAA >60   < > >60   < > >60 >60 >60  GFRAA >60   < > >60   < > >60 >60 >60  ANIONGAP 8   < > 6   < > 8 9 10    < > = values in this interval not displayed.     Hematology Recent Labs  Lab 11/11/19 1747 11/11/19 1747 11/12/19 0622 11/12/19 1601 11/13/19 0633  WBC 13.2*  --  12.0*  --  8.3  RBC 2.89*  --  2.90*  --  3.12*  HGB 7.4*   < > 7.2* 7.1* 7.9*  HCT 24.7*   < > 25.0* 24.4* 26.9*  MCV 85.5  --  86.2  --  86.2  MCH 25.6*  --  24.8*  --  25.3*  MCHC 30.0  --  28.8*  --  29.4*  RDW 16.4*  --  16.5*  --  16.4*  PLT 568*  --  580*  --  542*   < > = values in this interval not displayed.    Cardiac EnzymesNo results for input(s): TROPONINI in the last 168 hours. No results  for input(s): TROPIPOC in the last 168 hours.   BNPNo results for input(s): BNP, PROBNP in the last 168 hours.   DDimer No results for input(s): DDIMER in the last 168 hours.   Radiology    No results found.  Cardiac Studies   none  Patient Profile     86 y.o. male admitted with sepsis and atrial fib and a RVR, and anemia, s/p transfusion.  Assessment & Plan    1. Atrial fib - he is maintaining NSR. Continue current therapy. I would continue to hold systemic anti-coagulation. 2. Pericardial effusion - no additional workup.  3. Left BKA - appears to be stable.       For questions or updates, please contact [place] Please consult [email or web address] for contact info under Cardiology/STEMI.      Signed, Cristopher Peru, MD  11/14/2019, 9:22 AM  Patient ID: [name], male   DOB: 1933/03/03, 86 y.o.   MRN: [id number]

## 2019-11-15 LAB — GLUCOSE, CAPILLARY
Glucose-Capillary: 172 mg/dL — ABNORMAL HIGH (ref 70–99)
Glucose-Capillary: 176 mg/dL — ABNORMAL HIGH (ref 70–99)
Glucose-Capillary: 198 mg/dL — ABNORMAL HIGH (ref 70–99)
Glucose-Capillary: 166 mg/dL — ABNORMAL HIGH (ref 70–99)
Glucose-Capillary: 210 mg/dL — ABNORMAL HIGH (ref 70–99)
Glucose-Capillary: 221 mg/dL — ABNORMAL HIGH (ref 70–99)

## 2019-11-15 MED ORDER — APIXABAN 5 MG PO TABS
5.0000 mg | ORAL_TABLET | Freq: Two times a day (BID) | ORAL | 0 refills | Status: DC
Start: 2019-11-15 — End: 2019-12-24

## 2019-11-15 MED ORDER — APIXABAN 5 MG PO TABS
5.0000 mg | ORAL_TABLET | Freq: Two times a day (BID) | ORAL | Status: DC
  Administered 2019-11-15 – 2019-11-26 (×18): 5 mg via ORAL
  Filled 2019-11-15 (×20): qty 1

## 2019-11-15 MED ORDER — CLOPIDOGREL BISULFATE 75 MG PO TABS
75.0000 mg | ORAL_TABLET | Freq: Every day | ORAL | Status: DC
  Administered 2019-11-15 – 2019-11-26 (×10): 75 mg via ORAL
  Filled 2019-11-15 (×11): qty 1

## 2019-11-15 NOTE — Progress Notes (Signed)
Progress Note  Patient Name: Anthony Dunlap Date of Encounter: 11/15/2019  Primary Cardiologist: Carlyle Dolly, MD   Subjective   Wants to get out of hospital. Denies chest pain or sob.   Inpatient Medications    Scheduled Meds: . sodium chloride   Intravenous Once  . sodium chloride   Intravenous Once  . amitriptyline  20 mg Oral QHS  . aspirin EC  81 mg Oral Q breakfast  . Chlorhexidine Gluconate Cloth  6 each Topical Daily  . diltiazem  240 mg Oral Daily  . docusate sodium  100 mg Oral Daily  . ferrous sulfate  325 mg Oral Q breakfast  . heparin  5,000 Units Subcutaneous Q8H  . insulin aspart  0-5 Units Subcutaneous QHS  . insulin aspart  0-9 Units Subcutaneous TID WC  . insulin glargine  4 Units Subcutaneous QHS  . levothyroxine  100 mcg Oral Q breakfast  . metoprolol succinate  50 mg Oral Daily  . multivitamin with minerals  1 tablet Oral Daily  . oxybutynin  5 mg Oral QHS  . pantoprazole (PROTONIX) IV  40 mg Intravenous QHS  . polyethylene glycol  17 g Oral Daily  . pravastatin  10 mg Oral Q supper  . primidone  150 mg Oral QHS  . tobramycin  2 drop Both Eyes Q4H while awake   Continuous Infusions: . sodium chloride 75 mL/hr at 11/15/19 0648  . magnesium sulfate bolus IVPB     PRN Meds: acetaminophen **OR** acetaminophen, ALPRAZolam, alum & mag hydroxide-simeth, colchicine, guaiFENesin-dextromethorphan, hydrALAZINE, labetalol, liver oil-zinc oxide, magnesium sulfate bolus IVPB, metoprolol tartrate, morphine injection, ondansetron, oxyCODONE-acetaminophen, phenol, potassium chloride   Vital Signs    Vitals:   11/14/19 2352 11/15/19 0346 11/15/19 0747 11/15/19 0846  BP: (!) 143/80 (!) 145/88 (!) 150/86 (!) 149/89  Pulse: 99  (!) 105 (!) 113  Resp: 20 18    Temp: 98 F (36.7 C) 98 F (36.7 C) 97.7 F (36.5 C)   TempSrc: Oral Oral Oral   SpO2: 98%  94% 92%  Weight:  79.4 kg    Height:        Intake/Output Summary (Last 24 hours) at 11/15/2019  1008 Last data filed at 11/15/2019 2458 Gross per 24 hour  Intake 240 ml  Output 5300 ml  Net -5060 ml   Filed Weights   11/13/19 0433 11/14/19 0408 11/15/19 0346  Weight: 82 kg 79.4 kg 79.4 kg    Telemetry    nsr/ST - Personally Reviewed  ECG    none - Personally Reviewed  Physical Exam   GEN: No acute distress.   Neck: No JVD Cardiac: RRR, no murmurs, rubs, or gallops.  Respiratory: Clear to auscultation bilaterally. GI: Soft, nontender, non-distended  MS: No edema; No deformity. S/p bilateral BKA Neuro:  Nonfocal  Psych: Normal affect   Labs    Chemistry Recent Labs  Lab 11/09/19 0306 11/09/19 0306 11/10/19 0323 11/10/19 0323 11/11/19 1747 11/11/19 1747 11/12/19 0622 11/13/19 0633 11/14/19 0840  NA 138   < > 138   < > 137   < > 138 136 136  K 4.3   < > 4.1   < > 4.5   < > 4.3 4.1 3.9  CL 112*   < > 111   < > 108   < > 107 106 103  CO2 18*   < > 21*   < > 21*   < > 22 20* 22  GLUCOSE 94   < >  93   < > 222*   < > 81 79 132*  BUN 9   < > 9   < > 9   < > 10 10 9   CREATININE 1.02   < > 1.03   < > 0.90   < > 0.95 0.93 0.94  CALCIUM 8.2*   < > 8.0*   < > 7.7*   < > 7.8* 7.9* 8.0*  PROT 6.2*  --  6.0*  --  5.2*  --   --   --   --   ALBUMIN 2.2*  --  2.0*  --  1.9*  --   --   --   --   AST 18  --  19  --  16  --   --   --   --   ALT 19  --  17  --  14  --   --   --   --   ALKPHOS 131*  --  122  --  115  --   --   --   --   BILITOT 0.3  --  0.5  --  0.4  --   --   --   --   GFRNONAA >60   < > >60   < > >60   < > >60 >60 >60  GFRAA >60   < > >60   < > >60   < > >60 >60 >60  ANIONGAP 8   < > 6   < > 8   < > 9 10 11    < > = values in this interval not displayed.     Hematology Recent Labs  Lab 11/12/19 0622 11/12/19 0622 11/12/19 1601 11/13/19 0633 11/14/19 1045  WBC 12.0*  --   --  8.3 7.8  RBC 2.90*  --   --  3.12* 3.12*  HGB 7.2*   < > 7.1* 7.9* 8.0*  HCT 25.0*   < > 24.4* 26.9* 26.6*  MCV 86.2  --   --  86.2 85.3  MCH 24.8*  --   --  25.3* 25.6*   MCHC 28.8*  --   --  29.4* 30.1  RDW 16.5*  --   --  16.4* 16.1*  PLT 580*  --   --  542* 607*   < > = values in this interval not displayed.    Cardiac EnzymesNo results for input(s): TROPONINI in the last 168 hours. No results for input(s): TROPIPOC in the last 168 hours.   BNPNo results for input(s): BNP, PROBNP in the last 168 hours.   DDimer No results for input(s): DDIMER in the last 168 hours.   Radiology    No results found.  Cardiac Studies   none  Patient Profile     86 y.o. male admitted with sepsis and atrial fib with a RVR, anemia, s/p amputation.   Assessment & Plan    1. Atrial fib - he is maintaining NSR. Ok to start back his systemic anti-coagulation from cardiology perspective. Continue beta blocker and calcium channel blocker. 2. Disp. - ok for DC to CIR/SNF     For questions or updates, please contact [place] Please consult [email or web address] for contact info under Cardiology/STEMI.      Signed, Cristopher Peru, MD  11/15/2019, 10:08 AM  Patient ID: [name], male   DOB: 12-18-1933, 86 y.o.   MRN: [id number]

## 2019-11-15 NOTE — Progress Notes (Signed)
PROGRESS NOTE    Anthony Dunlap  YTK:[id number] DOB: 12-18-1933 DOA: 11/05/2019 PCP: [name], MD   Chief Complaint  Patient presents with  . possible sepsis   Brief Narrative: 86 year old with DM2, prostate cancer, severe PVD followed by [name] status post right BKA and history of hip osteomyelitis on chronic suppressive antibiotic therapy was admitted from SNF[facility] fever and new onset A. fib with RVR. On admission patient was hemodynamically stable but his hemoglobin was noted to be markedly decreased from last month. History per patient's daughter who is his caregiver noted that he had been having black stool for a week prior to that. Patient was not started on anticoagulation anticoagulation for his atrial fibrillation. Patient was transfused 1 unit PRBC. Patient was seen by cardiology after being placed on a Cardizem drip. Cardizem was discontinued and patient was placed on Lopressor with reasonable control of his heart rate. Patient was seen by [name] who also follows him as an outpatient. He underwent an MRI which revealed osteomyelitis of his left heel. Skin surgery was called to consult regarding blood flow. Patient underwent EGD on 11/09/2019 which was unrevealing. Vascular has advised amputation for left heel wound/osteo and he agreed for surgery on 9/22 And underwent left BKA Postop did well.  Antibiotics were discontinued as per ID recommendation.  Pain is stable.  H/h at 8.0 gm Seen by PT OT at this time plan is for inpatient rehabilitation  Subjective: No acute events overnight.  Blood pressure stable.  Afebrile. Resting pain controlled RN at bedside  Assessment & Plan:  Left heel pressure ulcer with underlying calcaneal osteomyelitis XNT:ZGYF by ID and [name] was on Dapto/Unasyn. He is s/p left BKA 9/22 and antibiotics were discontinued as per ID recommendation.  Continue local wound care pain control physical therapy occupational therapy, CIR  Pending.  Sepsis  POA 2/2 #1:as evident by fever, tachypnea, tachycardia and lactic acidosis.  Hemodynamically stable off antibiotics  A. fib with RVR: Sinus rhythm, continue on current Cardizem and Toprol.  Defer anticoagulation decision to cardiology and they are holding it for now.    Anemia of chronic disease with reported melena S/P EGD 9/20 was normal.  Hemoglobin is stable at 8.0g. Improving w/o transfusion. Continue PPI. Seen by GI and had EGD. Recent Labs  Lab 11/11/19 1747 11/12/19 0622 11/12/19 1601 11/13/19 0633 11/14/19 1045  HGB 7.4* 7.2* 7.1* 7.9* 8.0*  HCT 24.7* 25.0* 24.4* 26.9* 26.6*   Pericardial effusion, small: No further work-up per cardiology  Hypokalemia/hypomagnesemia: Stable and improved.  PVD/Lt VCB:SWHQ calcaneal osteomyelitis.  DM2:uncontrolled hba1c at 8.5.  No more hypoglycemia patient.  Blood sugar fairly stable on decrease dose of Lantus at 4 units and sliding scale.  Recent Labs  Lab 11/14/19 1125 11/14/19 1624 11/14/19 2047 11/15/19 0343 11/15/19 0744  GLUCAP 197* 200* 226* 172* 176*   Lab Results  Component Value Date   HGBA1C 8.5 (H) 11/05/2019   Anxiety/Depression: Mood is stable continue home Primodine and amitriptyline  Hypothyroidism: Continue Synthroid.  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 Cont ditropan as well.  DVT prophylaxis: heparin injection 5,000 Units Start: 11/12/19 0600 SCD's Start: 11/11/19 1446 SCDs Start: 11/05/19 2016 Code Status:   Code Status:  Full Code  Family Communication: plan of care discussed with patient at bedside. I have discussed patient's daughter previously no family at bedside today. Status is: Inpatient Remains inpatient appropriate because:Inpatient  level of care appropriate due to severity of illness  Dispo: The patient is from: Home/SNF              Anticipated d/c is to: CIR              Anticipated d/c date is: Once bed available              Patient currently medically stable for d/c. Nutrition: Diet Order            Diet heart healthy/carb modified Room service appropriate? Yes; Fluid consistency: Thin  Diet effective now                Body mass index is 26.62 kg/m. Pressure Ulcer: Pressure Injury 11/09/19 Buttocks Right;Proximal Stage 2 -  Partial thickness loss of dermis presenting as a shallow open injury with a red, pink wound bed without slough. (Active)  11/09/19 1915  Location: Buttocks  Location Orientation: Right;Proximal  Staging: Stage 2 -  Partial thickness loss of dermis presenting as a shallow open injury with a red, pink wound bed without slough.  Wound Description (Comments):   Present on Admission:    Consultants:see note  Procedures:see note Microbiology:see note Blood Culture    Component Value Date/Time   SDES BLOOD 11/05/2019 1212   SPECREQUEST NONE 11/05/2019 1212   CULT  11/05/2019 1212    NO GROWTH 5 DAYS Performed at [facility], [address] [place], [place] [address]    REPTSTATUS 11/10/2019 FINAL 11/05/2019 1212    Other culture-see note  Medications: Scheduled Meds: . sodium chloride   Intravenous Once  . sodium chloride   Intravenous Once  . amitriptyline  20 mg Oral QHS  . aspirin EC  81 mg Oral Q breakfast  . Chlorhexidine Gluconate Cloth  6 each Topical Daily  . diltiazem  240 mg Oral Daily  . docusate sodium  100 mg Oral Daily  . ferrous sulfate  325 mg Oral Q breakfast  . heparin  5,000 Units Subcutaneous Q8H  . insulin aspart  0-5 Units Subcutaneous QHS  . insulin aspart  0-9 Units Subcutaneous TID WC  . insulin glargine  4 Units Subcutaneous QHS  . levothyroxine  100 mcg Oral Q breakfast  . metoprolol succinate  50 mg Oral Daily  . multivitamin with minerals   1 tablet Oral Daily  . oxybutynin  5 mg Oral QHS  . pantoprazole (PROTONIX) IV  40 mg Intravenous QHS  . polyethylene glycol  17 g Oral Daily  . pravastatin  10 mg Oral Q supper  . primidone  150 mg Oral QHS  . tobramycin  2 drop Both Eyes Q4H while awake   Continuous Infusions: . sodium chloride 75 mL/hr at 11/15/19 0648  . magnesium sulfate bolus IVPB      Antimicrobials: Anti-infectives (From admission, onward)   Start     Dose/Rate Route Frequency Ordered Stop   11/11/19 1500  ceFAZolin (ANCEF) IVPB 2g/100 mL premix  Status:  Discontinued        2 g 200 mL/hr over 30 Minutes Intravenous Every 8 hours 11/11/19 1446 11/11/19 1513   11/07/19 1200  DAPTOmycin (CUBICIN) 700 mg in sodium chloride 0.9 % IVPB  Status:  Discontinued        700 mg 228 mL/hr over  30 Minutes Intravenous Daily 11/07/19 1056 11/12/19 1141   11/07/19 1200  Ampicillin-Sulbactam (UNASYN) 3 g in sodium chloride 0.9 % 100 mL IVPB  Status:  Discontinued        3 g 200 mL/hr over 30 Minutes Intravenous Every 6 hours 11/07/19 1056 11/12/19 1141   11/06/19 1300  vancomycin (VANCOREADY) IVPB 1250 mg/250 mL  Status:  Discontinued        1,250 mg 166.7 mL/hr over 90 Minutes Intravenous Every 24 hours 11/05/19 1324 11/07/19 1030   11/06/19 0000  ceFEPIme (MAXIPIME) 2 g in sodium chloride 0.9 % 100 mL IVPB  Status:  Discontinued        2 g 200 mL/hr over 30 Minutes Intravenous Every 12 hours 11/05/19 1648 11/07/19 1030   11/05/19 2200  metroNIDAZOLE (FLAGYL) IVPB 500 mg  Status:  Discontinued        500 mg 100 mL/hr over 60 Minutes Intravenous Every 8 hours 11/05/19 1637 11/07/19 1030   11/05/19 1300  vancomycin (VANCOREADY) IVPB 2000 mg/400 mL        2,000 mg 200 mL/hr over 120 Minutes Intravenous  Once 11/05/19 1226 11/05/19 1453   11/05/19 1230  metroNIDAZOLE (FLAGYL) IVPB 500 mg        500 mg 100 mL/hr over 60 Minutes Intravenous  Once 11/05/19 1223 11/05/19 1345   11/05/19 1215  ceFEPIme (MAXIPIME) 2 g in sodium  chloride 0.9 % 100 mL IVPB        2 g 200 mL/hr over 30 Minutes Intravenous  Once 11/05/19 1201 11/05/19 1254     Objective: Vitals: Today's Vitals   11/14/19 1930 11/14/19 2352 11/15/19 0346 11/15/19 0747  BP:  (!) 143/80 (!) 145/88 (!) 150/86  Pulse:  99  (!) 105  Resp:  20 18   Temp:  98 F (36.7 C) 98 F (36.7 C) 97.7 F (36.5 C)  TempSrc:  Oral Oral Oral  SpO2:  98%  94%  Weight:   79.4 kg   Height:      PainSc: 0-No pain       Intake/Output Summary (Last 24 hours) at 11/15/2019 0830 Last data filed at 11/15/2019 0648 Gross per 24 hour  Intake --  Output 5300 ml  Net -5300 ml   Filed Weights   11/13/19 0433 11/14/19 0408 11/15/19 0346  Weight: 82 kg 79.4 kg 79.4 kg   Weight change: 0 kg  Intake/Output from previous day: 09/25 0701 - 09/26 0700 In: -  Out: 5300 [Urine:5300] Intake/Output this shift: No intake/output data recorded.  Examination:  General exam: AAO at baseline , NAD, weak appearing. HEENT:Oral mucosa moist, Ear/Nose WNL grossly, dentition normal. Respiratory system: bilaterally clear,no wheezing or crackles,no use of accessory muscle Cardiovascular system: S1 & S2 +, No JVD,. Gastrointestinal system: Abdomen soft, NT,ND, BS+ Nervous System:Alert, awake, moving extremities and grossly nonfocal Extremities: Right BKA stump healed, left BKA staple intact no edema, distal peripheral pulses palpable.  Skin: No rashes,no icterus. MSK: Normal muscle bulk,tone, power  Data Reviewed: I have personally reviewed following labs and imaging studies CBC: Recent Labs  Lab 11/11/19 1747 11/12/19 0622 11/12/19 1601 11/13/19 0633 11/14/19 1045  WBC 13.2* 12.0*  --  8.3 7.8  NEUTROABS  --   --   --   --  5.6  HGB 7.4* 7.2* 7.1* 7.9* 8.0*  HCT 24.7* 25.0* 24.4* 26.9* 26.6*  MCV 85.5 86.2  --  86.2 85.3  PLT 568* 580*  --  542* 607*  Basic Metabolic Panel: Recent Labs  Lab 11/09/19 0306 11/09/19 0306 11/10/19 0323 11/11/19 1747 11/12/19 0622  11/13/19 0633 11/14/19 0840  NA 138   < > 138 137 138 136 136  K 4.3   < > 4.1 4.5 4.3 4.1 3.9  CL 112*   < > 111 108 107 106 103  CO2 18*   < > 21* 21* 22 20* 22  GLUCOSE 94   < > 93 222* 81 79 132*  BUN 9   < > 9 9 10 10 9   CREATININE 1.02   < > 1.03 0.90 0.95 0.93 0.94  CALCIUM 8.2*   < > 8.0* 7.7* 7.8* 7.9* 8.0*  MG 2.0  --   --   --   --   --   --    < > = values in this interval not displayed.   GFR: Estimated Creatinine Clearance: 56.6 mL/min (by C-G formula based on SCr of 0.94 mg/dL). Liver Function Tests: Recent Labs  Lab 11/09/19 0306 11/10/19 0323 11/11/19 1747  AST 18 19 16   ALT 19 17 14   ALKPHOS 131* 122 115  BILITOT 0.3 0.5 0.4  PROT 6.2* 6.0* 5.2*  ALBUMIN 2.2* 2.0* 1.9*   No results for input(s): LIPASE, AMYLASE in the last 168 hours. No results for input(s): AMMONIA in the last 168 hours. Coagulation Profile: No results for input(s): INR, PROTIME in the last 168 hours. Cardiac Enzymes: Recent Labs  Lab 11/13/19 0633  CKTOTAL 461*   BNP (last 3 results) No results for input(s): PROBNP in the last 8760 hours. HbA1C: No results for input(s): HGBA1C in the last 72 hours. CBG: Recent Labs  Lab 11/14/19 1125 11/14/19 1624 11/14/19 2047 11/15/19 0343 11/15/19 0744  GLUCAP 197* 200* 226* 172* 176*   Lipid Profile: No results for input(s): CHOL, HDL, LDLCALC, TRIG, CHOLHDL, LDLDIRECT in the last 72 hours. Thyroid Function Tests: No results for input(s): TSH, T4TOTAL, FREET4, T3FREE, THYROIDAB in the last 72 hours. Anemia Panel: No results for input(s): VITAMINB12, FOLATE, FERRITIN, TIBC, IRON, RETICCTPCT in the last 72 hours. Sepsis Labs: No results for input(s): PROCALCITON, LATICACIDVEN in the last 168 hours.  Recent Results (from the past 240 hour(s))  Urine culture     Status: Abnormal   Collection Time: 11/05/19 11:55 AM   Specimen: In/Out Cath Urine  Result Value Ref Range Status   Specimen Description   Final    IN/OUT CATH  URINE Performed at [facility], [address] [place], [place] [address]    Special Requests   Final    NONE Performed at [facility], [address], [place], [place] [address]    Culture MULTIPLE SPECIES PRESENT, SUGGEST RECOLLECTION (A)  Final   Report Status 11/06/2019 FINAL  Final  Blood Culture (routine x 2)     Status: None   Collection Time: 11/05/19 12:03 PM   Specimen: BLOOD  Result Value Ref Range Status   Specimen Description BLOOD  Final   Special Requests NONE  Final   Culture   Final    NO GROWTH 5 DAYS Performed at [facility], [address] [place], [place] [address]    Report Status 11/10/2019 FINAL  Final  Blood Culture (routine x 2)     Status: None   Collection Time: 11/05/19 12:12 PM   Specimen: BLOOD  Result Value Ref Range Status   Specimen Description BLOOD  Final   Special Requests NONE  Final   Culture  Final    NO GROWTH 5 DAYS Performed at [facility], [address], [place], [place] [address]    Report Status 11/10/2019 FINAL  Final  SARS Coronavirus 2 by RT PCR (hospital order, performed in [facility] hospital lab) Nasopharyngeal Nasopharyngeal Swab     Status: None   Collection Time: 11/05/19 12:17 PM   Specimen: Nasopharyngeal Swab  Result Value Ref Range Status   SARS Coronavirus 2 NEGATIVE NEGATIVE Final    Comment: (NOTE) SARS-CoV-2 target nucleic acids are NOT DETECTED.  The SARS-CoV-2 RNA is generally detectable in upper and lower respiratory specimens during the acute phase of infection. The lowest concentration of SARS-CoV-2 viral copies this assay can detect is 250 copies / mL. A negative result does not preclude SARS-CoV-2 infection and should not be used as the sole basis for treatment or other patient management decisions.  A negative result may occur with improper specimen collection / handling, submission of specimen other than nasopharyngeal swab, presence of viral mutation(s) within the areas targeted by this assay,  and inadequate number of viral copies (<250 copies / mL). A negative result must be combined with clinical observations, patient history, and epidemiological information.  Fact Sheet for Patients:   StrictlyIdeas.no  Fact Sheet for Healthcare Providers: BankingDealers.co.za  This test is not yet approved or  cleared by the [place] FDA and has been authorized for detection and/or diagnosis of SARS-CoV-2 by FDA under an Emergency Use Authorization (EUA).  This EUA will remain in effect (meaning this test can be used) for the duration of the COVID-19 declaration under Section 564(b)(1) of the Act, 21 U.S.C. section 360bbb-3(b)(1), unless the authorization is terminated or revoked sooner.  Performed at [facility], [address] [place], [place] [address]      Radiology Studies: No results found.   LOS: 10 days   Antonieta Pert, MD [facility]  11/15/2019, 8:30 AM

## 2019-11-16 DIAGNOSIS — M86172 Other acute osteomyelitis, left ankle and foot: Secondary | ICD-10-CM | POA: Diagnosis not present

## 2019-11-16 LAB — GLUCOSE, CAPILLARY
Glucose-Capillary: 165 mg/dL — ABNORMAL HIGH (ref 70–99)
Glucose-Capillary: 231 mg/dL — ABNORMAL HIGH (ref 70–99)
Glucose-Capillary: 205 mg/dL — ABNORMAL HIGH (ref 70–99)
Glucose-Capillary: 217 mg/dL — ABNORMAL HIGH (ref 70–99)

## 2019-11-16 MED ORDER — PANTOPRAZOLE SODIUM 40 MG PO TBEC
40.0000 mg | DELAYED_RELEASE_TABLET | Freq: Every day | ORAL | Status: DC
  Administered 2019-11-16 – 2019-11-25 (×10): 40 mg via ORAL
  Filled 2019-11-16 (×10): qty 1

## 2019-11-16 NOTE — Progress Notes (Signed)
PROGRESS NOTE    Anthony Dunlap  PYK:[id number] DOB: 1933/10/25 DOA: 11/05/2019 PCP: [name], MD   Chief Complaint  Patient presents with  . possible sepsis   Brief Narrative: 86 year old with DM2, prostate cancer, severe PVD followed by [name] status post right BKA and history of hip osteomyelitis on chronic suppressive antibiotic therapy was admitted from SNFfor fever and new onset A. fib with RVR. On admission patient was hemodynamically stable but his hemoglobin was noted to be markedly decreased from last month. History per patient's daughter who is his caregiver noted that he had been having black stool for a week prior to that. Patient was not started on anticoagulation anticoagulation for his atrial fibrillation. Patient was transfused 1 unit PRBC. Patient was seen by cardiology after being placed on a Cardizem drip. Cardizem was discontinued and patient was placed on Lopressor with reasonable control of his heart rate. Patient was seen by [name] who also follows him as an outpatient. He underwent an MRI which revealed osteomyelitis of his left heel. Skin surgery was called to consult regarding blood flow. Patient underwent EGD on 11/09/2019 which was unrevealing. Vascular has advised amputation for left heel wound/osteo and he agreed for surgery on 9/22 And underwent left BKA Postop did well.  Antibiotics were discontinued as per ID recommendation.  Pain is stable.  H/h at 8.0 gm Seen by PT OT at this time plan is for inpatient rehabilitation   Subjective: Resting comfortably no new complaints. Pain is controlled.  Stable intact on the left BKA  Assessment & Plan:  Left heel pressure ulcer with underlying calcaneal osteomyelitis NLZ:JQBH by ID and [name] was on Dapto/Unasyn. He is s/p left BKA 9/22 and antibiotics were discontinued as per ID recommendation.  Continue PT OT, per vascular follow-up 3 to 4 weeks for staple removal   Sepsis POA 2/2 #1:as evident by  fever, tachypnea, tachycardia and lactic acidosis.  Resolved.  A. fib with RVR: Maintaining normal sinus rhythm, back on Eliquis and Plavix as per cardiology.  Continue Cardizem and Toprol.   Anemia of chronic disease with reported melena S/P EGD 9/20 was normal.  Hemoglobin is stable at 8.0g. Improving w/o transfusion. Continue PPI. Seen by GI and had EGD. Recent Labs  Lab 11/11/19 1747 11/12/19 0622 11/12/19 1601 11/13/19 0633 11/14/19 1045  HGB 7.4* 7.2* 7.1* 7.9* 8.0*  HCT 24.7* 25.0* 24.4* 26.9* 26.6*   Pericardial effusion, small no further work-up per cardiology Hypokalemia/hypomagnesemia: Resolved.  PVD/Lt BKA, and right BKA: cont plavix  DM2:uncontrolled hba1c at 8.5.  No more hypoglycemic episode.  Blood sugar fairly stable.  Continue decreased dose of Lantus 4 units and sliding scale.  Recent Labs  Lab 11/15/19 1702 11/15/19 2026 11/15/19 2138 11/16/19 0725 11/16/19 1205  GLUCAP 166* 210* 221* 165* 231*   Anxiety/Depression: Mood is stable continue home amitriptyline.  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  Continue Ditropan.  Follow-up with urology as outpatient  DVT prophylaxis: SCD's Start: 11/11/19 1446 SCDs Start: 11/05/19 2016 Code Status:   Code Status: Full Code  Family Communication: plan of care discussed  with patient at bedside. I have discussed patient's daughter previously no family at bedside today. Status is: Inpatient Remains inpatient appropriate because:Inpatient level of care appropriate due to severity of illness  Dispo: The patient is  from: Home/SNF              Anticipated d/c is to: CIR-p.eer to peer review done and CIR denied and expected appeal in process              Anticipated d/c date is: Once bed available              Patient currently medically stable for d/c. Nutrition: Diet Order            Diet heart healthy/carb modified Room service appropriate? Yes; Fluid consistency: Thin  Diet effective now                Body mass index is 25.11 kg/m. Pressure Ulcer: Pressure Injury 11/09/19 Buttocks Right;Proximal Stage 2 -  Partial thickness loss of dermis presenting as a shallow open injury with a red, pink wound bed without slough. (Active)  11/09/19 1915  Location: Buttocks  Location Orientation: Right;Proximal  Staging: Stage 2 -  Partial thickness loss of dermis presenting as a shallow open injury with a red, pink wound bed without slough.  Wound Description (Comments):   Present on Admission:    Consultants:see note  Procedures:see note Microbiology:see note Blood Culture    Component Value Date/Time   SDES BLOOD 11/05/2019 1212   SPECREQUEST NONE 11/05/2019 1212   CULT  11/05/2019 1212    NO GROWTH 5 DAYS Performed at [facility], [address], [place], [place] [address]    REPTSTATUS 11/10/2019 FINAL 11/05/2019 1212    Other culture-see note  Medications: Scheduled Meds: . sodium chloride   Intravenous Once  . sodium chloride   Intravenous Once  . amitriptyline  20 mg Oral QHS  . apixaban  5 mg Oral BID  . Chlorhexidine Gluconate Cloth  6 each Topical Daily  . clopidogrel  75 mg Oral Daily  . diltiazem  240 mg Oral Daily  . docusate sodium  100 mg Oral Daily  . ferrous sulfate  325 mg Oral Q breakfast  . insulin aspart  0-5 Units Subcutaneous QHS  . insulin aspart  0-9 Units Subcutaneous TID WC  . insulin glargine  4 Units Subcutaneous QHS  . levothyroxine  100 mcg Oral Q breakfast  . metoprolol succinate  50 mg Oral Daily  . multivitamin with minerals  1 tablet Oral Daily  .  oxybutynin  5 mg Oral QHS  . pantoprazole (PROTONIX) IV  40 mg Intravenous QHS  . polyethylene glycol  17 g Oral Daily  . pravastatin  10 mg Oral Q supper  . tobramycin  2 drop Both Eyes Q4H while awake   Continuous Infusions: . sodium chloride 75 mL/hr at 11/15/19 2151  . magnesium sulfate bolus IVPB      Antimicrobials: Anti-infectives (From admission, onward)   Start     Dose/Rate Route Frequency Ordered Stop   11/11/19 1500  ceFAZolin (ANCEF) IVPB 2g/100 mL premix  Status:  Discontinued        2 g 200 mL/hr over 30 Minutes Intravenous Every 8 hours 11/11/19 1446 11/11/19 1513   11/07/19 1200  DAPTOmycin (CUBICIN) 700 mg in sodium chloride 0.9 % IVPB  Status:  Discontinued        700 mg 228 mL/hr over 30 Minutes Intravenous Daily 11/07/19 1056 11/12/19  1141   11/07/19 1200  Ampicillin-Sulbactam (UNASYN) 3 g in sodium chloride 0.9 % 100 mL IVPB  Status:  Discontinued        3 g 200 mL/hr over 30 Minutes Intravenous Every 6 hours 11/07/19 1056 11/12/19 1141   11/06/19 1300  vancomycin (VANCOREADY) IVPB 1250 mg/250 mL  Status:  Discontinued        1,250 mg 166.7 mL/hr over 90 Minutes Intravenous Every 24 hours 11/05/19 1324 11/07/19 1030   11/06/19 0000  ceFEPIme (MAXIPIME) 2 g in sodium chloride 0.9 % 100 mL IVPB  Status:  Discontinued        2 g 200 mL/hr over 30 Minutes Intravenous Every 12 hours 11/05/19 1648 11/07/19 1030   11/05/19 2200  metroNIDAZOLE (FLAGYL) IVPB 500 mg  Status:  Discontinued        500 mg 100 mL/hr over 60 Minutes Intravenous Every 8 hours 11/05/19 1637 11/07/19 1030   11/05/19 1300  vancomycin (VANCOREADY) IVPB 2000 mg/400 mL        2,000 mg 200 mL/hr over 120 Minutes Intravenous  Once 11/05/19 1226 11/05/19 1453   11/05/19 1230  metroNIDAZOLE (FLAGYL) IVPB 500 mg        500 mg 100 mL/hr over 60 Minutes Intravenous  Once 11/05/19 1223 11/05/19 1345   11/05/19 1215  ceFEPIme (MAXIPIME) 2 g in sodium chloride 0.9 % 100 mL IVPB        2 g 200 mL/hr over  30 Minutes Intravenous  Once 11/05/19 1201 11/05/19 1254     Objective: Vitals: Today's Vitals   11/16/19 0428 11/16/19 0433 11/16/19 0726 11/16/19 1107  BP: (!) 154/95  (!) 167/92 (!) 157/96  Pulse: 99  (!) 108 (!) 102  Resp: (!) 21  20 18   Temp: (!) 97.4 F (36.3 C)  98.5 F (36.9 C) 99 F (37.2 C)  TempSrc: Oral  Oral Oral  SpO2: 95%  94% 96%  Weight:  74.9 kg    Height:      PainSc:        Intake/Output Summary (Last 24 hours) at 11/16/2019 1319 Last data filed at 11/16/2019 1105 Gross per 24 hour  Intake 800 ml  Output 5850 ml  Net -5050 ml   Filed Weights   11/14/19 0408 11/15/19 0346 11/16/19 0433  Weight: 79.4 kg 79.4 kg 74.9 kg   Weight change: -4.5 kg  Intake/Output from previous day: 09/26 0701 - 09/27 0700 In: 1040 [P.O.:1040] Out: 5750 [Urine:5750] Intake/Output this shift: Total I/O In: 240 [P.O.:240] Out: 950 [Urine:950]  Examination:  General exam: AAO, at baseline,NAD, weak appearing. HEENT:Oral mucosa moist, Ear/Nose WNL grossly, dentition normal. Respiratory system: bilaterally clear,no wheezing or crackles,no use of accessory muscle Cardiovascular system: S1 & S2 +, No JVD,. Gastrointestinal system: Abdomen soft, NT,ND, BS+ Nervous System:Alert, awake, moving extremities and grossly nonfocal Extremities: Bilateral BKA right healing, left intact staple  Skin: No rashes,no icterus. MSK: Normal muscle bulk,tone, power  Data Reviewed: I have personally reviewed following labs and imaging studies CBC: Recent Labs  Lab 11/11/19 1747 11/12/19 0622 11/12/19 1601 11/13/19 0633 11/14/19 1045  WBC 13.2* 12.0*  --  8.3 7.8  NEUTROABS  --   --   --   --  5.6  HGB 7.4* 7.2* 7.1* 7.9* 8.0*  HCT 24.7* 25.0* 24.4* 26.9* 26.6*  MCV 85.5 86.2  --  86.2 85.3  PLT 568* 580*  --  542* 505*   Basic Metabolic Panel: Recent Labs  Lab 11/10/19 0323  11/11/19 1747 11/12/19 0622 11/13/19 0633 11/14/19 0840  NA 138 137 138 136 136  K 4.1 4.5 4.3 4.1  3.9  CL 111 108 107 106 103  CO2 21* 21* 22 20* 22  GLUCOSE 93 222* 81 79 132*  BUN 9 9 10 10 9   CREATININE 1.03 0.90 0.95 0.93 0.94  CALCIUM 8.0* 7.7* 7.8* 7.9* 8.0*   GFR: Estimated Creatinine Clearance: 56.6 mL/min (by C-G formula based on SCr of 0.94 mg/dL). Liver Function Tests: Recent Labs  Lab 11/10/19 0323 11/11/19 1747  AST 19 16  ALT 17 14  ALKPHOS 122 115  BILITOT 0.5 0.4  PROT 6.0* 5.2*  ALBUMIN 2.0* 1.9*   No results for input(s): LIPASE, AMYLASE in the last 168 hours. No results for input(s): AMMONIA in the last 168 hours. Coagulation Profile: No results for input(s): INR, PROTIME in the last 168 hours. Cardiac Enzymes: Recent Labs  Lab 11/13/19 0633  CKTOTAL 461*   BNP (last 3 results) No results for input(s): PROBNP in the last 8760 hours. HbA1C: No results for input(s): HGBA1C in the last 72 hours. CBG: Recent Labs  Lab 11/15/19 1702 11/15/19 2026 11/15/19 2138 11/16/19 0725 11/16/19 1205  GLUCAP 166* 210* 221* 165* 231*   Lipid Profile: No results for input(s): CHOL, HDL, LDLCALC, TRIG, CHOLHDL, LDLDIRECT in the last 72 hours. Thyroid Function Tests: No results for input(s): TSH, T4TOTAL, FREET4, T3FREE, THYROIDAB in the last 72 hours. Anemia Panel: No results for input(s): VITAMINB12, FOLATE, FERRITIN, TIBC, IRON, RETICCTPCT in the last 72 hours. Sepsis Labs: No results for input(s): PROCALCITON, LATICACIDVEN in the last 168 hours.  No results found for this or any previous visit (from the past 240 hour(s)).   Radiology Studies: No results found.   LOS: 11 days   Antonieta Pert, MD [facility]  11/16/2019, 1:19 PM

## 2019-11-16 NOTE — Progress Notes (Signed)
Physical Therapy Treatment Patient Details Name: Anthony Dunlap MRN: [id number] DOB: 1933-07-15 Today's Date: 11/16/2019    History of Present Illness Pt is an 86 y.o. male admitted from SNF on 11/05/19 with sepsis, nonhealing L foot ulcer, afib with RVR. 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  Post L BKA 9/22.    PT Comments    Pt presently confused on entry, reports he got up and walked around over the weekend and is ready to do it again today. Pt with difficulty understanding ramifications of B BKA. That said pt is very agreeable to working with therapy. Continue to tell pt that he can not transfer to chair as he had in the past and he will need to learn how to back himself into recliner/wc. Pt requires maximal cuing to achieve and requires modAx2 to come to longsitting, maxA to pivot perpendicular to bed, and totalAx2 for backing into chair. PT continues to recommend CIR level rehab given pt's desire and willingness to work towards independent mobility. PT will continue to follow acutely.    Follow Up Recommendations  CIR     Equipment Recommendations  Other (comment) (TBD at next venue)    Recommendations for Other Services Rehab consult     Precautions / Restrictions Precautions Precautions: Fall Precaution Comments: R BKA, s/p 9/22 L BKA Restrictions Other Position/Activity Restrictions: NWB conservatively due to osteomyelitis of heel    Mobility  Bed Mobility Overal bed mobility: Needs Assistance Bed Mobility: Supine to Sit     Supine to sit: Mod assist;+2 for physical assistance     General bed mobility comments: modA for coming to longsitting in bed   Transfers Overall transfer level: Needs assistance Equipment used: None Transfers: Comptroller transfers: Max assist;Total assist;+2 physical  assistance   General transfer comment: once in longsitting requires max A to pivot perpendicular to bed to prep for A-P transfer, total Ax2 for 3x scoots posteriorly with use of bed pad to move hips  Ambulation/Gait             General Gait Details: unable       Balance Overall balance assessment: Needs assistance Sitting-balance support: Feet unsupported;Bilateral upper extremity supported Sitting balance-Leahy Scale: Poor Sitting balance - Comments: requires assist for longsitting in middle of bed  Postural control: Posterior lean                                  Cognition Arousal/Alertness: Awake/alert Behavior During Therapy: WFL for tasks assessed/performed Overall Cognitive Status: Impaired/Different from baseline Area of Impairment: Attention;Following commands;Problem solving;Awareness;Safety/judgement                   Current Attention Level: Selective   Following Commands: Follows multi-step commands inconsistently Safety/Judgement: Decreased awareness of safety;Decreased awareness of deficits Awareness: Emergent Problem Solving: Difficulty sequencing;Requires verbal cues;Requires tactile cues General Comments: patient follows commands well, increased processing time for mutil step commands and VC's for safety.      Exercises General Exercises - Upper Extremity Chair Push Up: AROM;AAROM;Both;5 reps;Seated General Exercises - Lower Extremity Quad Sets: AROM;Both;10 reps;Seated    General Comments General comments (skin integrity, edema, etc.): VSS on RA      Pertinent Vitals/Pain Pain Assessment: No/denies pain  PT Goals (current goals can now be found in the care plan section) Acute Rehab PT Goals Patient Stated Goal: Figure out how to do things from the wheelchair PT Goal Formulation: With patient Time For Goal Achievement: 11/26/19 Potential to Achieve Goals: Fair Progress towards PT goals: Progressing toward  goals    Frequency    Min 3X/week      PT Plan Discharge plan needs to be updated       AM-PAC PT "6 Clicks" Mobility   Outcome Measure  Help needed turning from your back to your side while in a flat bed without using bedrails?: A Little Help needed moving from lying on your back to sitting on the side of a flat bed without using bedrails?: A Little Help needed moving to and from a bed to a chair (including a wheelchair)?: A Little Help needed standing up from a chair using your arms (e.g., wheelchair or bedside chair)?: A Lot Help needed to walk in hospital room?: Total Help needed climbing 3-5 steps with a railing? : Total 6 Click Score: 13    End of Session   Activity Tolerance: Patient tolerated treatment well Patient left: in chair;with call bell/phone within reach;with chair alarm set Nurse Communication: Mobility status PT Visit Diagnosis: Other abnormalities of gait and mobility (R26.89);Muscle weakness (generalized) (M62.81);Difficulty in walking, not elsewhere classified (R26.2)     Time: 5830-9407 PT Time Calculation (min) (ACUTE ONLY): 30 min  Charges:  $Therapeutic Exercise: 8-22 mins $Therapeutic Activity: 8-22 mins                     Rashi Granier B. Migdalia Dk PT, DPT Acute Rehabilitation Services Pager [phone] Office [phone]    [place] 11/16/2019, 3:13 PM

## 2019-11-16 NOTE — Progress Notes (Signed)
Chart reviewed, our team wrote sign off note on Friday. Telemetry reviewed - only NSR/sinus tach, no recurrent atrial fib seen. Patient is now on anticoag per chart. No new recs. OP f/u arranged in [place] office [facility] did not have availability during timeframe needed) in October, relayed info on AVS. Melina Copa PA-C

## 2019-11-16 NOTE — Progress Notes (Signed)
PROGRESS NOTE Anthony Dunlap  TDS:[id number] DOB: 07/10/1933 DOA: 11/05/2019 PCP: [name], MD   Chief Complaint  Patient presents with  . possible sepsis   Brief Narrative: 86 year old with DM2,prostate cancer, severe PVD followed by [name] status post right BKA and history of hip osteomyelitis on chronic suppressive antibiotic therapy was admitted from SNFfor fever and new onset A. fib with RVR. On admission patient was hemodynamically stable but his hemoglobin was noted to be markedly decreased from last month. History per patient's daughter who is his caregiver noted that he had been having black stool for a week prior to that. Patient was not started on anticoagulation anticoagulation for his atrial fibrillation. Patient was transfused 1 unit PRBC. Patient was seen by cardiology after being placed on a Cardizem drip. Cardizem was discontinued and patient was placed on Lopressor with reasonable control of his heart rate. Patient was seen by [name] who also follows him as an outpatient. He underwent an MRI which revealed osteomyelitis of his left heel. Skin surgery was called to consult regarding blood flow. Patient underwent EGD on 11/09/2019 which was unrevealing. Vascular has advised amputation for left heel wound/osteo and he agreed for surgery on 9/22 And underwent left BKA Postop did well.  Antibiotics were discontinued as per ID recommendation.  Pain is stable.  H/h at 8.0 gm Seen by PT OT at this time plan is for inpatient rehabilitation   Subjective: Again febrile this morning one 1.2.  Had fever episode yesterday.   Blood culture sent chest x-ray no pneumonia, urine grossly abnormal  overnight having hematuria with clot in the Foley bag.   In NSR. He has no complaints.    Assessment & Plan:  Fever episodes: Also mild leukocytosis.  Likely UTI in the setting of Foley catheter and grossly abnormal UA, urine culture sent.  Chest x-ray no pneumonia, procalcitonin 0.18>  0.25, blood cultures were ordered 9/30.  Given again episode of fever this morning started on ceftriaxone.  Monitor fever curve, follow-up culture data.  Stump site not felt to be the source of fever by vascular as no drainage, dose have mild erythema as expected around staples.  Gross hematuria urology was notified this morning.Holding Plavix and Eliquis today-reassess in the morning.  He has a chronic Foley in place underwent cystoscopy with upsizing of the Foley due to continuous leakage last week by [name].  Dr. Claudia Desanctis was consulted again today.  Dr. Was requesting suprapubic catheter but at this time urology had advised to follow-up outpatient.  Left heel pressure ulcer with underlying calcaneal osteomyelitis WIO:MBTD by ID and [name] was on Dapto/Unasyn. 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  Continue PT OT, SNF placement.  Vascular surgery following closely has mild redness around the staple site, apparently normal and no infection suspected.  Plan for follow up in 3 to 4 weeks for staple removal.    Sepsis POA 2/2 #1:as evident by fever, tachypnea, tachycardia and lactic acidosis. Hemodynamically stable.  A. fib with RVR: In sinus rhythm continue Eliquis and Plavix as per cardiology.Continue Cardizem IR changed from CD has difficulty with swallowing.  Continue Toprol.    Anemia of chronic disease with reported melena S/P EGD 9/20 was normal.  Hemoglobin overall is stable.  FOBT is negative. Recent Labs  Lab 11/14/19 1045 11/17/19 0143 11/19/19 1822 11/20/19 0348 11/20/19 1217  HGB 8.0* 8.4* 8.7* 8.2* 8.3*  HCT 26.6* 27.9* 28.9* 27.0* 27.4*  Pericardial effusion, small:no Further work-up as per cardiology.  Hypokalemia/hypomagnesemia: Resolved.  PVD/Lt BKA, and Right BKA: Resume Plavix tomorrow if no hematuria and okay w/ urology  DM2:uncontrolled hba1c at 8.5. no more hypoglycemia.  Blood sugar running on higher side, will  increase Lantus to 10 units and continue on ssi. Recent Labs  Lab 11/19/19 1216 11/19/19 1613 11/19/19 2146 11/20/19 0757 11/20/19 1221  GLUCAP 294* 248* 284* 240* 284*   Anxiety/Depression: Mood is stable continue home amitriptyline.  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  Hx of prostate cancer/XRT therapy/Chronic foley-he reports it has been there for 1 and half years.s/p cystoscopy and upsizing by urology 9/22. Having gross hematuria- urology Dr Claudia Desanctis notified.see above.  cont ditropan.  DVT prophylaxis: SCD's Start: 11/11/19 1446 SCDs Start: 11/05/19 2016 Code Status:   Code Status: Full Code  Family Communication: plan of care discussed with patient at bedside. I called and updated his daughter Pinnix today- she is working on setting up help at home hopefully she can get in in next 2 days.  Status NF:[id number] Remains inpatient appropriate because:Inpatient level of care appropriate due to severity of illness  Dispo:The patient is from:SNF            Anticipated d/c is to:CIR has been declined by insurance despite appeal. likley home w/HH.Insurance  did approve for SNF. Patient does not want to go to skilled nursing facility.            Anticipated d/c date is:2 days.            Patient currently is not medically stable for d/c. Nutrition: Diet Order            Diet heart healthy/carb modified Room service appropriate? Yes; Fluid consistency: Thin  Diet effective now                Body mass index is 23.6 kg/m. Pressure Ulcer: Pressure Injury 11/09/19 Buttocks Right;Proximal Stage 2 -  Partial thickness loss of dermis presenting as a shallow open injury with a red, pink wound bed without slough. (Active)  11/09/19 1915  Location: Buttocks  Location Orientation: Right;Proximal  Staging: Stage 2 -  Partial thickness loss of dermis presenting as a  shallow open injury with a red, pink wound bed without slough.  Wound Description (Comments):   Present on Admission:    Consultants:see note  Procedures:see note Microbiology:see note Blood Culture    Component Value Date/Time   SDES BLOOD LEFT HAND 11/19/2019 1758   SDES BLOOD RIGHT HAND 11/19/2019 1758   SPECREQUEST  11/19/2019 1758    BOTTLES DRAWN AEROBIC AND ANAEROBIC Blood Culture adequate volume   SPECREQUEST  11/19/2019 1758    BOTTLES DRAWN AEROBIC AND ANAEROBIC Blood Culture adequate volume   CULT  11/19/2019 1758    NO GROWTH < 24 HOURS Performed at [facility], [place] [address], [place], [place] [address]    CULT  11/19/2019 1758    NO GROWTH < 24 HOURS Performed at [facility], [place] [address], [place], [place] [address]    REPTSTATUS PENDING 11/19/2019 1758   REPTSTATUS PENDING 11/19/2019 1758    Other culture-see note  Medications: Scheduled Meds: . sodium chloride   Intravenous Once  . sodium chloride   Intravenous Once  . amitriptyline  20 mg Oral QHS  . apixaban  5 mg Oral BID  .  Chlorhexidine Gluconate Cloth  6 each Topical Daily  . clopidogrel  75 mg Oral Daily  . diltiazem  60 mg Oral Q6H  . docusate  100 mg Oral Daily  . ferrous sulfate  325 mg Oral Q breakfast  . insulin aspart  0-5 Units Subcutaneous QHS  . insulin aspart  0-9 Units Subcutaneous TID WC  . insulin glargine  10 Units Subcutaneous QHS  . levothyroxine  100 mcg Oral Q breakfast  . metoprolol succinate  50 mg Oral Daily  . multivitamin with minerals  1 tablet Oral Daily  . oxybutynin  5 mg Oral QHS  . pantoprazole  40 mg Oral QHS  . polyethylene glycol  17 g Oral Daily  . pravastatin  10 mg Oral Q supper  . tobramycin  2 drop Both Eyes Q4H while awake   Continuous Infusions: . sodium chloride 75 mL/hr at 11/20/19 0854  . cefTRIAXone (ROCEPHIN)  IV 1 g (11/20/19 1245)  . magnesium sulfate bolus IVPB      Antimicrobials: Anti-infectives (From admission, onward)    Start     Dose/Rate Route Frequency Ordered Stop   11/20/19 1230  cefTRIAXone (ROCEPHIN) 1 g in sodium chloride 0.9 % 100 mL IVPB        1 g 200 mL/hr over 30 Minutes Intravenous Every 24 hours 11/20/19 1228 11/25/19 1229   11/11/19 1500  ceFAZolin (ANCEF) IVPB 2g/100 mL premix  Status:  Discontinued        2 g 200 mL/hr over 30 Minutes Intravenous Every 8 hours 11/11/19 1446 11/11/19 1513   11/07/19 1200  DAPTOmycin (CUBICIN) 700 mg in sodium chloride 0.9 % IVPB  Status:  Discontinued        700 mg 228 mL/hr over 30 Minutes Intravenous Daily 11/07/19 1056 11/12/19 1141   11/07/19 1200  Ampicillin-Sulbactam (UNASYN) 3 g in sodium chloride 0.9 % 100 mL IVPB  Status:  Discontinued        3 g 200 mL/hr over 30 Minutes Intravenous Every 6 hours 11/07/19 1056 11/12/19 1141   11/06/19 1300  vancomycin (VANCOREADY) IVPB 1250 mg/250 mL  Status:  Discontinued        1,250 mg 166.7 mL/hr over 90 Minutes Intravenous Every 24 hours 11/05/19 1324 11/07/19 1030   11/06/19 0000  ceFEPIme (MAXIPIME) 2 g in sodium chloride 0.9 % 100 mL IVPB  Status:  Discontinued        2 g 200 mL/hr over 30 Minutes Intravenous Every 12 hours 11/05/19 1648 11/07/19 1030   11/05/19 2200  metroNIDAZOLE (FLAGYL) IVPB 500 mg  Status:  Discontinued        500 mg 100 mL/hr over 60 Minutes Intravenous Every 8 hours 11/05/19 1637 11/07/19 1030   11/05/19 1300  vancomycin (VANCOREADY) IVPB 2000 mg/400 mL        2,000 mg 200 mL/hr over 120 Minutes Intravenous  Once 11/05/19 1226 11/05/19 1453   11/05/19 1230  metroNIDAZOLE (FLAGYL) IVPB 500 mg        500 mg 100 mL/hr over 60 Minutes Intravenous  Once 11/05/19 1223 11/05/19 1345   11/05/19 1215  ceFEPIme (MAXIPIME) 2 g in sodium chloride 0.9 % 100 mL IVPB        2 g 200 mL/hr over 30 Minutes Intravenous  Once 11/05/19 1201 11/05/19 1254     Objective: Vitals: Today's Vitals   11/20/19 0759 11/20/19 0800 11/20/19 1002 11/20/19 1223  BP: 131/70  138/82 134/81  Pulse: (!)  103  [name])  109 (!) 110  Resp: 20   20  Temp: 99.2 F (37.3 C)   (!) 101.2 F (38.4 C)  TempSrc: Oral   Oral  SpO2: 95%   94%  Weight:      Height:      PainSc:  0-No pain      Intake/Output Summary (Last 24 hours) at 11/20/2019 1351 Last data filed at 11/20/2019 0908 Gross per 24 hour  Intake 720 ml  Output 600 ml  Net 120 ml   Filed Weights   11/18/19 0508 11/19/19 0630 11/20/19 0532  Weight: 70.2 kg 67.9 kg 70.4 kg   Weight change: 2.5 kg  Intake/Output from previous day: 09/30 0701 - 10/01 0700 In: 840 [P.O.:840] Out: 600 [Urine:600] Intake/Output this shift: Total I/O In: 480 [P.O.:480] Out: -   Examination: General exam: AAO at baseline, not in acute distress, on room air. HEENT:Oral mucosa moist, Ear/Nose WNL grossly, dentition normal. Respiratory system: bilaterally clear,no wheezing or crackles,no use of accessory muscle Cardiovascular system: S1 & S2 +, No JVD,. Gastrointestinal system: Abdomen soft, NT,ND, BS+ Nervous System:Alert, awake, moving extremities and grossly nonfocal Extremities: BKA stump healing, left BKA with staple intact w/ mild erythema around the staple site but no drainage no edema, distal peripheral pulses palpable.  Skin: No rashes,no icterus. MSK: Normal muscle bulk,tone, power  Data Reviewed: I have personally reviewed following labs and imaging studies CBC: Recent Labs  Lab 11/14/19 1045 11/17/19 0143 11/19/19 1822 11/20/19 0348 11/20/19 1217  WBC 7.8 10.5 11.5* 13.5*  --   NEUTROABS 5.6  --   --   --   --   HGB 8.0* 8.4* 8.7* 8.2* 8.3*  HCT 26.6* 27.9* 28.9* 27.0* 27.4*  MCV 85.3 81.8 81.0 80.8  --   PLT 607* 700* 624* 625*  --    Basic Metabolic Panel: Recent Labs  Lab 11/14/19 0840 11/17/19 0143 11/19/19 1822  NA 136 134* 131*  K 3.9 3.5 3.8  CL 103 100 97*  CO2 22 24 22   GLUCOSE 132* 187* 279*  BUN 9 9 17   CREATININE 0.94 0.87 1.15  CALCIUM 8.0* 8.3* 8.3*   GFR: Estimated Creatinine Clearance: 46.3 mL/min  (by C-G formula based on SCr of 1.15 mg/dL). Liver Function Tests: Recent Labs  Lab 11/19/19 1822  AST 52*  ALT 28  ALKPHOS 145*  BILITOT 0.4  PROT 6.7  ALBUMIN 2.2*   No results for input(s): LIPASE, AMYLASE in the last 168 hours. No results for input(s): AMMONIA in the last 168 hours. Coagulation Profile: No results for input(s): INR, PROTIME in the last 168 hours. Cardiac Enzymes: No results for input(s): CKTOTAL, CKMB, CKMBINDEX, TROPONINI in the last 168 hours. BNP (last 3 results) No results for input(s): PROBNP in the last 8760 hours. HbA1C: No results for input(s): HGBA1C in the last 72 hours. CBG: Recent Labs  Lab 11/19/19 1216 11/19/19 1613 11/19/19 2146 11/20/19 0757 11/20/19 1221  GLUCAP 294* 248* 284* 240* 284*   Lipid Profile: No results for input(s): CHOL, HDL, LDLCALC, TRIG, CHOLHDL, LDLDIRECT in the last 72 hours. Thyroid Function Tests: No results for input(s): TSH, T4TOTAL, FREET4, T3FREE, THYROIDAB in the last 72 hours. Anemia Panel: No results for input(s): VITAMINB12, FOLATE, FERRITIN, TIBC, IRON, RETICCTPCT in the last 72 hours. Sepsis Labs: Recent Labs  Lab 11/19/19 1822 11/20/19 0348  PROCALCITON 0.18 0.25    Recent Results (from the past 240 hour(s))  Culture, blood (routine x 2)     Status: None (Preliminary  result)   Collection Time: 11/19/19  5:58 PM   Specimen: BLOOD LEFT HAND  Result Value Ref Range Status   Specimen Description BLOOD LEFT HAND  Final   Special Requests   Final    BOTTLES DRAWN AEROBIC AND ANAEROBIC Blood Culture adequate volume   Culture   Final    NO GROWTH < 24 HOURS Performed at [facility], [address], [place], [place] [address]    Report Status PENDING  Incomplete  Culture, blood (routine x 2)     Status: None (Preliminary result)   Collection Time: 11/19/19  5:58 PM   Specimen: BLOOD RIGHT HAND  Result Value Ref Range Status   Specimen Description BLOOD RIGHT HAND  Final   Special  Requests   Final    BOTTLES DRAWN AEROBIC AND ANAEROBIC Blood Culture adequate volume   Culture   Final    NO GROWTH < 24 HOURS Performed at [facility], [place] [address], [place], [place] [address]    Report Status PENDING  Incomplete     Radiology Studies: DG Chest Port 1 View  Result Date: 11/19/2019 CLINICAL DATA:  Fever. EXAM: PORTABLE CHEST 1 VIEW COMPARISON:  11/05/2019 FINDINGS: Borderline cardiomegaly. Unchanged mediastinal contours. Mild chronic elevation of left hemidiaphragm with adjacent atelectasis. Confluent consolidation. No pulmonary edema. No pneumothorax. No significant pleural effusion. Stable osseous structures with chronic degenerative change of the left shoulder. IMPRESSION: 1. No evidence of pneumonia.  Borderline cardiomegaly. 2. Chronic elevation of left hemidiaphragm with adjacent atelectasis. Electronically Signed   By: Keith Rake M.D.   On: 11/19/2019 17:53     LOS: 15 days   Antonieta Pert, MD [facility]  11/20/2019, 1:51 PM

## 2019-11-16 NOTE — Progress Notes (Signed)
Occupational Therapy Treatment Patient Details Name: Anthony Dunlap MRN: [id number] DOB: 1933-06-14 Today's Date: 11/16/2019    History of present illness Pt is an 86 y.o. male admitted from SNF on 11/05/19 with sepsis, nonhealing L foot ulcer, afib with RVR. 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  Post L BKA 9/22.   OT comments  Patient's daughter in the room this date.  Multiple questions regarding discharge planning, ACE wrap to L residual limb and how to get overhead monitor to stop alarming.  Nurse called and came to the room to answer families questions.  Initiated HEP this date to maximize UB use during transfer training.  Patient continues to demonstrate good participation and CIR referral remains appropriate.  HEP plan to be developed and brought to the room for carryover.  OT to continue to follow in the acute setting.    Follow Up Recommendations  CIR;Supervision/Assistance - 24 hour    Equipment Recommendations  3 in 1 bedside commode;Wheelchair (measurements OT);Wheelchair cushion (measurements OT);Hospital bed    Recommendations for Other Services      Precautions / Restrictions Precautions Precautions: Fall Precaution Comments: R BKA, s/p 9/22 L BKA Restrictions RLE Weight Bearing: Non weight bearing LLE Weight Bearing: Non weight bearing Other Position/Activity Restrictions: NWB conservatively due to osteomyelitis of heel                                  Cognition Arousal/Alertness: Awake/alert Behavior During Therapy: Flat affect Overall Cognitive Status: Within Functional Limits for tasks assessed Area of Impairment: Attention;Following commands;Problem solving;Awareness;Safety/judgement                      Following Commands: Follows multi-step commands inconsistently Safety/Judgement: Decreased awareness of  safety;Decreased awareness of deficits Awareness: Emergent Problem Solving: Difficulty sequencing;Requires verbal cues;Requires tactile cues General Comments: patient follows commands well, increased processing time for mutil step commands and VC's for safety.        Exercises  Other Exercises Other Exercises: Theraband: chest press seated 2 sets and 10 reps then Lat pull seated 2 sets and 10 reps - orange.    Push then pull Other Exercises: Tricep push seated 2 sets and 10 reps then bicep pull 2 sets and 10 reps seated.  Push pull - orange theraband Other Exercises: lateral raise in scaption seated 2 sets and 10 reps - orange Other Exercises: seated horizontal ABd ADd 2 sets and 10 reps - orange   Shoulder Instructions       General Comments VSS on RA    Pertinent Vitals/ Pain       Pain Assessment: No/denies pain                                                          Frequency  Min 2X/week        Progress Toward Goals  OT Goals(current goals can now be found in the care plan section)  Progress towards OT goals: Progressing toward goals  Acute Rehab OT Goals Patient Stated Goal: None mentioned this date. OT Goal Formulation: With patient Time For Goal Achievement: 11/21/19 Potential to Achieve Goals:  Good  Plan Discharge plan remains appropriate                     AM-PAC OT "6 Clicks" Daily Activity     Outcome Measure   Help from another person eating meals?: None Help from another person taking care of personal grooming?: A Little Help from another person toileting, which includes using toliet, bedpan, or urinal?: A Lot Help from another person bathing (including washing, rinsing, drying)?: A Lot Help from another person to put on and taking off regular upper body clothing?: A Little Help from another person to put on and taking off regular lower body clothing?: A Lot 6 Click Score: 16    End of Session    OT Visit  Diagnosis: Muscle weakness (generalized) (M62.81);Pain;Other symptoms and signs involving cognitive function Pain - Right/Left: Left Pain - part of body: Leg   Activity Tolerance Patient tolerated treatment well   Patient Left in chair;with call bell/phone within reach;with chair alarm set;with family/visitor present   Nurse Communication          Time: 6294-7654 OT Time Calculation (min): 18 min  Charges: OT General Charges $OT Visit: 1 Visit OT Treatments $Therapeutic Exercise: 8-22 mins  11/16/2019  Rich, OTR/L  Acute Rehabilitation Services  Office:  [phone]    Metta Clines 11/16/2019, 3:36 PM

## 2019-11-16 NOTE — Discharge Instructions (Signed)

## 2019-11-16 NOTE — Progress Notes (Addendum)
  Progress Note    11/16/2019 7:22 AM 5 Days Post-Op  Subjective:  No complaints  Afebrile  Vitals:   11/16/19 0003 11/16/19 0428  BP: (!) 153/76 (!) 154/95  Pulse: 98 99  Resp: 20 (!) 21  Temp: 98.5 F (36.9 C) (!) 97.4 F (36.3 C)  SpO2: 98% 95%    Physical Exam: Incisions:  Clean and dry with staples in tact.    CBC    Component Value Date/Time   WBC 7.8 11/14/2019 1045   RBC 3.12 (L) 11/14/2019 1045   HGB 8.0 (L) 11/14/2019 1045   HCT 26.6 (L) 11/14/2019 1045   PLT 607 (H) 11/14/2019 1045   MCV 85.3 11/14/2019 1045   MCH 25.6 (L) 11/14/2019 1045   MCHC 30.1 11/14/2019 1045   RDW 16.1 (H) 11/14/2019 1045   LYMPHSABS 1.2 11/14/2019 1045   MONOABS 0.6 11/14/2019 1045   EOSABS 0.4 11/14/2019 1045   BASOSABS 0.0 11/14/2019 1045    BMET    Component Value Date/Time   NA 136 11/14/2019 0840   K 3.9 11/14/2019 0840   CL 103 11/14/2019 0840   CO2 22 11/14/2019 0840   GLUCOSE 132 (H) 11/14/2019 0840   BUN 9 11/14/2019 0840   CREATININE 0.94 11/14/2019 0840   CREATININE 1.07 10/28/2019 1205   CALCIUM 8.0 (L) 11/14/2019 0840   GFRNONAA >60 11/14/2019 0840   GFRNONAA 63 10/28/2019 1205   GFRAA >60 11/14/2019 0840   GFRAA 73 10/28/2019 1205    INR    Component Value Date/Time   INR 1.2 11/05/2019 1203     Intake/Output Summary (Last 24 hours) at 11/16/2019 0761 Last data filed at 11/16/2019 0600 Gross per 24 hour  Intake 1040 ml  Output 5750 ml  Net -4710 ml     Assessment/Plan:  86 y.o. male is s/p left below knee amputation  5 Days Post-Op  -pt's incision looks good, healing nicely with staples in tact -f/u in 4-6 weeks for staple removal.   Leontine Locket, PA-C [facility] [phone] 11/16/2019 7:22 AM    I have seen and evaluated the patient. I agree with the PA note as documented above. Left BKA healing nicely.  Discussed f/u 3-4 weeks staple removal.  OK for discharge from our standpoint.  Marty Heck,  MD [facility] [place] Office: [phone]

## 2019-11-17 DIAGNOSIS — M86172 Other acute osteomyelitis, left ankle and foot: Secondary | ICD-10-CM | POA: Diagnosis not present

## 2019-11-17 LAB — CBC
HCT: 27.9 % — ABNORMAL LOW (ref 39.0–52.0)
Hemoglobin: 8.4 g/dL — ABNORMAL LOW (ref 13.0–17.0)
MCH: 24.6 pg — ABNORMAL LOW (ref 26.0–34.0)
MCHC: 30.1 g/dL (ref 30.0–36.0)
MCV: 81.8 fL (ref 80.0–100.0)
Platelets: 700 10*3/uL — ABNORMAL HIGH (ref 150–400)
RBC: 3.41 MIL/uL — ABNORMAL LOW (ref 4.22–5.81)
RDW: 15.8 % — ABNORMAL HIGH (ref 11.5–15.5)
WBC: 10.5 10*3/uL (ref 4.0–10.5)
nRBC: 0 % (ref 0.0–0.2)

## 2019-11-17 LAB — BASIC METABOLIC PANEL
Anion gap: 10 (ref 5–15)
BUN: 9 mg/dL (ref 8–23)
CO2: 24 mmol/L (ref 22–32)
Calcium: 8.3 mg/dL — ABNORMAL LOW (ref 8.9–10.3)
Chloride: 100 mmol/L (ref 98–111)
Creatinine, Ser: 0.87 mg/dL (ref 0.61–1.24)
GFR calc Af Amer: >60 mL/min (ref >60)
GFR calc non Af Amer: >60 mL/min (ref >60)
Glucose, Bld: 187 mg/dL — ABNORMAL HIGH (ref 70–99)
Potassium: 3.5 mmol/L (ref 3.5–5.1)
Sodium: 134 mmol/L — ABNORMAL LOW (ref 135–145)

## 2019-11-17 LAB — GLUCOSE, CAPILLARY
Glucose-Capillary: 208 mg/dL — ABNORMAL HIGH (ref 70–99)
Glucose-Capillary: 206 mg/dL — ABNORMAL HIGH (ref 70–99)
Glucose-Capillary: 236 mg/dL — ABNORMAL HIGH (ref 70–99)
Glucose-Capillary: 204 mg/dL — ABNORMAL HIGH (ref 70–99)

## 2019-11-17 MED ORDER — INSULIN GLARGINE 100 UNIT/ML ~~LOC~~ SOLN
6.0000 [IU] | Freq: Every day | SUBCUTANEOUS | Status: DC
  Administered 2019-11-17 – 2019-11-18 (×2): 6 [IU] via SUBCUTANEOUS
  Filled 2019-11-17 (×3): qty 0.06

## 2019-11-17 MED ORDER — INSULIN GLARGINE 100 UNIT/ML ~~LOC~~ SOLN
6.0000 [IU] | Freq: Every day | SUBCUTANEOUS | 0 refills | Status: DC
Start: 2019-11-17 — End: 2019-12-02

## 2019-11-17 NOTE — Progress Notes (Signed)
PROGRESS NOTE    Anthony Dunlap  CHE:[id number] DOB: [birth year] DOA: 11/05/2019 PCP: [name], MD   Chief Complaint  Patient presents with  . possible sepsis   Brief Narrative: 86 year old with DM2, prostate cancer, severe PVD followed by [name] status post right BKA and history of hip osteomyelitis on chronic suppressive antibiotic therapy was admitted from SNFfor fever and new onset A. fib with RVR. On admission patient was hemodynamically stable but his hemoglobin was noted to be markedly decreased from last month. History per patient's daughter who is his caregiver noted that he had been having black stool for a week prior to that. Patient was not started on anticoagulation anticoagulation for his atrial fibrillation. Patient was transfused 1 unit PRBC. Patient was seen by cardiology after being placed on a Cardizem drip. Cardizem was discontinued and patient was placed on Lopressor with reasonable control of his heart rate. Patient was seen by [name] who also follows him as an outpatient. He underwent an MRI which revealed osteomyelitis of his left heel. Skin surgery was called to consult regarding blood flow. Patient underwent EGD on 11/09/2019 which was unrevealing. Vascular has advised amputation for left heel wound/osteo and he agreed for surgery on 9/22 And underwent left BKA Postop did well.  Antibiotics were discontinued as per ID recommendation.  Pain is stable.  H/h at 8.0 gm Seen by PT OT at this time plan is for inpatient rehabilitation   Subjective: Alert awake, not in acute distress, resting comfortably. Pain is controlled. Awaiting for CIR.   Assessment & Plan:  Left heel pressure ulcer with underlying calcaneal osteomyelitis NTI:RWER by ID and [name] was on Dapto/Unasyn. 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 Continue PT OT, per vascular follow-up 3 to 4 weeks for staple removal. Will benefit with inpatient  rehab placement as patient returned from SNF needing further amputation  Sepsis POA 2/2 #1:as evident by fever, tachypnea, tachycardia and lactic acidosis. Hemodynamically stable.  A. fib with RVR: Remains in normal sinus rhythm, continue Eliquis and Plavix as per cardiology.Continue Cardizem and Toprol.   Anemia of chronic disease with reported melena S/P EGD 9/20 was normal. Globin improving with transfusion 8.4 g today. Tolerating Eliquis. Continue PPI. Seen by GI and had EGD.[name] Recent Labs  Lab 11/12/19 0622 11/12/19 1601 11/13/19 0633 11/14/19 1045 11/17/19 0143  HGB 7.2* 7.1* 7.9* 8.0* 8.4*  HCT 25.0* 24.4* 26.9* 26.6* 27.9*   Pericardial effusion, small:no further work-up as per cardiology.   Hypokalemia/hypomagnesemia: resolved  PVD/Lt BKA, and Right XVQ:MGQQ plavix  DM2:uncontrolled hba1c at 8.5. no more  hypoglycemia.Blood sugar borderline controlled increase Lantus to 6 units  And cont ssi Recent Labs  Lab 11/16/19 1205 11/16/19 1619 11/16/19 2017 11/17/19 0716 11/17/19 1120  GLUCAP 231* 205* 217* 208* 206*   Anxiety/Depression: Mood is stable continue home amitriptyline.  Primidone discontinued due to interaction with Eliquis after discussion with the patient by pharmacy  H eight. 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  Hx of prostate cancer/XRT therapy/Chronic foley-he reports it has been there for 1 and half years.spoke w/ Dr Abner Greenspan- leakage around foley for few days-he had his Foley taken care of by upsizing with cystoscopy by urology 9/22.  Continue Ditropan.  Follow-up with urology as outpatient  DVT prophylaxis: SCD's Start: 11/11/19 1446 SCDs Start: 11/05/19 2016 Code Status:   Code Status: Full Code  Family Communication: plan of care discussed with patient  at bedside. I have discussed patient's daughter previously. CIR team spoke w/ daughter Status is: Inpatient Remains inpatient appropriate because:Inpatient level of care appropriate  due to severity of illness  Dispo: The patient is from: Home/SNF              Anticipated d/c is to: CIR-peer to peer review done and CIR denied and appeal is in process. Patient is high risk for readmission, will benefit with CIR placement               Anticipated d/c date is: Once bed available              Patient currently medically stable for d/c. Nutrition: Diet Order            Diet heart healthy/carb modified Room service appropriate? Yes; Fluid consistency: Thin  Diet effective now                Body mass index is 25.11 kg/m. Pressure Ulcer: Pressure Injury 11/09/19 Buttocks Right;Proximal Stage 2 -  Partial thickness loss of dermis presenting as a shallow open injury with a red, pink wound bed without slough. (Active)  11/09/19 1915  Location: Buttocks  Location Orientation: Right;Proximal  Staging: Stage 2 -  Partial thickness loss of dermis presenting as a shallow open injury with a red, pink wound bed without slough.  Wound Description (Comments):   Present on Admission:    Consultants:see note  Procedures:see note Microbiology:see note Blood Culture    Component Value Date/Time   SDES BLOOD 11/05/2019 1212   SPECREQUEST NONE 11/05/2019 1212   CULT  11/05/2019 1212    NO GROWTH 5 DAYS Performed at [facility], [address] [place], [place] [address]    REPTSTATUS 11/10/2019 FINAL 11/05/2019 1212    Other culture-see note  Medications: Scheduled Meds: . sodium chloride   Intravenous Once  . sodium chloride   Intravenous Once  . amitriptyline  20 mg Oral QHS  . apixaban  5 mg Oral BID  . Chlorhexidine Gluconate Cloth  6 each Topical Daily  . clopidogrel  75 mg Oral Daily  . diltiazem  240 mg Oral Daily  . docusate sodium  100 mg Oral Daily  . ferrous sulfate  325 mg Oral Q breakfast  . insulin aspart  0-5 Units Subcutaneous QHS  . insulin aspart  0-9 Units Subcutaneous TID WC  . insulin glargine  6 Units Subcutaneous QHS  . levothyroxine  100 mcg  Oral Q breakfast  . metoprolol succinate  50 mg Oral Daily  . multivitamin with minerals  1 tablet Oral Daily  . oxybutynin  5 mg Oral QHS  . pantoprazole  40 mg Oral QHS  . polyethylene glycol  17 g Oral Daily  . pravastatin  10 mg Oral Q supper  . tobramycin  2 drop Both Eyes Q4H while awake   Continuous Infusions: . sodium chloride 75 mL/hr at 11/15/19 2151  . magnesium sulfate bolus IVPB      Antimicrobials: Anti-infectives (From admission, onward)   Start     Dose/Rate Route Frequency Ordered Stop   11/11/19 1500  ceFAZolin (ANCEF) IVPB 2g/100 mL premix  Status:  Discontinued        2 g 200 mL/hr over 30 Minutes Intravenous Every 8 hours 11/11/19 1446 11/11/19 1513   11/07/19 1200  DAPTOmycin (CUBICIN) 700 mg in sodium chloride 0.9 % IVPB  Status:  Discontinued        700 mg 228 mL/hr  over 30 Minutes Intravenous Daily 11/07/19 1056 11/12/19 1141   11/07/19 1200  Ampicillin-Sulbactam (UNASYN) 3 g in sodium chloride 0.9 % 100 mL IVPB  Status:  Discontinued        3 g 200 mL/hr over 30 Minutes Intravenous Every 6 hours 11/07/19 1056 11/12/19 1141   11/06/19 1300  vancomycin (VANCOREADY) IVPB 1250 mg/250 mL  Status:  Discontinued        1,250 mg 166.7 mL/hr over 90 Minutes Intravenous Every 24 hours 11/05/19 1324 11/07/19 1030   11/06/19 0000  ceFEPIme (MAXIPIME) 2 g in sodium chloride 0.9 % 100 mL IVPB  Status:  Discontinued        2 g 200 mL/hr over 30 Minutes Intravenous Every 12 hours 11/05/19 1648 11/07/19 1030   11/05/19 2200  metroNIDAZOLE (FLAGYL) IVPB 500 mg  Status:  Discontinued        500 mg 100 mL/hr over 60 Minutes Intravenous Every 8 hours 11/05/19 1637 11/07/19 1030   11/05/19 1300  vancomycin (VANCOREADY) IVPB 2000 mg/400 mL        2,000 mg 200 mL/hr over 120 Minutes Intravenous  Once 11/05/19 1226 11/05/19 1453   11/05/19 1230  metroNIDAZOLE (FLAGYL) IVPB 500 mg        500 mg 100 mL/hr over 60 Minutes Intravenous  Once 11/05/19 1223 11/05/19 1345   11/05/19  1215  ceFEPIme (MAXIPIME) 2 g in sodium chloride 0.9 % 100 mL IVPB        2 g 200 mL/hr over 30 Minutes Intravenous  Once 11/05/19 1201 11/05/19 1254     Objective: Vitals: Today's Vitals   11/16/19 2341 11/17/19 0401 11/17/19 0720 11/17/19 1120  BP: (!) 150/85  (!) 163/100 (!) 160/110  Pulse: (!) 105 (!) 108 (!) 112 (!) 111  Resp: (!) 22 (!) 22  20  Temp: 97.8 F (36.6 C) 98.7 F (37.1 C) 98 F (36.7 C) 98 F (36.7 C)  TempSrc: Oral Oral Oral Oral  SpO2: 96% 97% 98% 95%  Weight:      Height:      PainSc:        Intake/Output Summary (Last 24 hours) at 11/17/2019 1431 Last data filed at 11/17/2019 0900 Gross per 24 hour  Intake 240 ml  Output 3525 ml  Net -3285 ml   Filed Weights   11/14/19 0408 11/15/19 0346 11/16/19 0433  Weight: 79.4 kg 79.4 kg 74.9 kg   Weight change:   Intake/Output from previous day: 09/27 0701 - 09/28 0700 In: 480 [P.O.:480] Out: 3800 [Urine:3800] Intake/Output this shift: Total I/O In: -  Out: 675 [Urine:675]  Examination: General exam: AAO., NAD, weak appearing. HEENT:Oral mucosa moist, Ear/Nose WNL grossly, dentition normal. Respiratory system: bilaterally clear,no wheezing or crackles,no use of accessory muscle Cardiovascular system: S1 & S2 +, No JVD,. Gastrointestinal system: Abdomen soft, NT,ND, BS+ Nervous System:Alert, awake, moving extremities and grossly nonfocal Extremities: BKA with stable intact, right BKA incision site healed. No edema, distal peripheral pulses palpable.  Skin: No rashes,no icterus. MSK: Normal muscle bulk,tone, power  Data Reviewed: I have personally reviewed following labs and imaging studies CBC: Recent Labs  Lab 11/11/19 1747 11/11/19 1747 11/12/19 0622 11/12/19 1601 11/13/19 0633 11/14/19 1045 11/17/19 0143  WBC 13.2*  --  12.0*  --  8.3 7.8 10.5  NEUTROABS  --   --   --   --   --  5.6  --   HGB 7.4*   < > 7.2* 7.1* 7.9* 8.0* 8.4*  HCT 24.7*   < > 25.0* 24.4* 26.9* 26.6* 27.9*  MCV 85.5   --  86.2  --  86.2 85.3 81.8  PLT 568*  --  580*  --  542* 607* 700*   < > = values in this interval not displayed.   Basic Metabolic Panel: Recent Labs  Lab 11/11/19 1747 11/12/19 0622 11/13/19 0633 11/14/19 0840 11/17/19 0143  NA 137 138 136 136 134*  K 4.5 4.3 4.1 3.9 3.5  CL 108 107 106 103 100  CO2 21* 22 20* 22 24  GLUCOSE 222* 81 79 132* 187*  BUN 9 10 10 9 9   CREATININE 0.90 0.95 0.93 0.94 0.87  CALCIUM 7.7* 7.8* 7.9* 8.0* 8.3*   GFR: Estimated Creatinine Clearance: 61.1 mL/min (by C-G formula based on SCr of 0.87 mg/dL). Liver Function Tests: Recent Labs  Lab 11/11/19 1747  AST 16  ALT 14  ALKPHOS 115  BILITOT 0.4  PROT 5.2*  ALBUMIN 1.9*   No results for input(s): LIPASE, AMYLASE in the last 168 hours. No results for input(s): AMMONIA in the last 168 hours. Coagulation Profile: No results for input(s): INR, PROTIME in the last 168 hours. Cardiac Enzymes: Recent Labs  Lab 11/13/19 0633  CKTOTAL 461*   BNP (last 3 results) No results for input(s): PROBNP in the last 8760 hours. HbA1C: No results for input(s): HGBA1C in the last 72 hours. CBG: Recent Labs  Lab 11/16/19 1205 11/16/19 1619 11/16/19 2017 11/17/19 0716 11/17/19 1120  GLUCAP 231* 205* 217* 208* 206*   Lipid Profile: No results for input(s): CHOL, HDL, LDLCALC, TRIG, CHOLHDL, LDLDIRECT in the last 72 hours. Thyroid Function Tests: No results for input(s): TSH, T4TOTAL, FREET4, T3FREE, THYROIDAB in the last 72 hours. Anemia Panel: No results for input(s): VITAMINB12, FOLATE, FERRITIN, TIBC, IRON, RETICCTPCT in the last 72 hours. Sepsis Labs: No results for input(s): PROCALCITON, LATICACIDVEN in the last 168 hours.  No results found for this or any previous visit (from the past 240 hour(s)).   Radiology Studies: No results found.   LOS: 12 days   Antonieta Pert, MD [facility]  11/17/2019, 2:31 PM

## 2019-11-17 NOTE — Progress Notes (Signed)
Inpatient Rehab Admissions Coordinator:   Expedited appeal pending. Will continue to follow.   Shann Medal, PT, DPT Admissions Coordinator [phone] 11/17/19  3:15 PM

## 2019-11-18 DIAGNOSIS — M86172 Other acute osteomyelitis, left ankle and foot: Secondary | ICD-10-CM | POA: Diagnosis not present

## 2019-11-18 LAB — GLUCOSE, CAPILLARY
Glucose-Capillary: 162 mg/dL — ABNORMAL HIGH (ref 70–99)
Glucose-Capillary: 193 mg/dL — ABNORMAL HIGH (ref 70–99)
Glucose-Capillary: 257 mg/dL — ABNORMAL HIGH (ref 70–99)
Glucose-Capillary: 241 mg/dL — ABNORMAL HIGH (ref 70–99)

## 2019-11-18 MED ORDER — DOCUSATE SODIUM 50 MG/5ML PO LIQD
100.0000 mg | Freq: Every day | ORAL | Status: DC
  Administered 2019-11-18 – 2019-11-26 (×8): 100 mg via ORAL
  Filled 2019-11-18 (×9): qty 10

## 2019-11-18 MED ORDER — DILTIAZEM HCL 60 MG PO TABS
60.0000 mg | ORAL_TABLET | Freq: Four times a day (QID) | ORAL | Status: DC
  Administered 2019-11-18 – 2019-11-23 (×20): 60 mg via ORAL
  Filled 2019-11-18 (×20): qty 1

## 2019-11-18 NOTE — TOC Progression Note (Addendum)
Transition of Care [facility]) - Progression Note    Patient Details  Name: Anthony Dunlap MRN: [id number] Date of Birth: [birth year]  Transition of Care [facility]) CM/SW [place], [place] Phone Number: 11/18/2019, 3:20 PM  Clinical Narrative:     Update 9/29 3:30pm-CSW spoke with patients daughter Anthony Dunlap. Anthony Dunlap told CSW she spoke with her sisters and she has decided to try and appeal it.   CSW will continue to follow.  CSW spoke with patients daughter Anthony Dunlap to let her know that the expedited appeal for CIR has been denied. CSW gave patients daughter the number she can try to call for family to appeal. Anthony Dunlap is going to call CSW back regarding whether they are going to try and appeal or go home with home health services/24/7 caregiver for patient.  CSW will continue to follow. Expected Discharge Plan: [place] Barriers to Discharge: No Barriers Identified  Expected Discharge Plan and Services Expected Discharge Plan: [place]   Discharge Planning Services: CM Consult Post Acute Care Choice: IP Rehab Living arrangements for the past 2 months: Single Family Home Expected Discharge Date: 11/13/19               DME Arranged:  (Has hospital bed, slide board, RW, 3in1) DME Agency: [place] Agency: [facility] (now [facility]), [facility] [place] Date [place]: 11/09/19 Time [place]: 85 Representative spoke with at [place]: [place] ([place]) Interventions    Readmission Risk Interventions Readmission Risk Prevention Plan 11/13/2019 09/29/2019 05/09/2018  Transportation Screening Complete Complete Complete  PCP or Specialist Appt within 5-7 Days - Not Complete -  Not Complete comments - plan for SNF -  PCP or Specialist Appt within 3-5 Days Complete - Complete  Home Care Screening - Complete -  Medication Review (RN CM) - Referral to  [place] or [place] Complete - Complete  Social Work Consult for [place] Planning/Counseling Complete - Complete  Palliative Care Screening Not Applicable - Not Applicable  Medication Review Press photographer) Complete - Complete  Some recent data might be hidden

## 2019-11-18 NOTE — Progress Notes (Signed)
Physical Therapy Treatment Patient Details Name: Anthony Dunlap MRN: [id number] DOB: [birth year] Today's Date: 11/18/2019    History of Present Illness Pt is an 86 y.o. male admitted from SNF on 11/05/19 with sepsis, nonhealing L foot ulcer, afib with RVR. 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  Post L BKA 9/22.    PT Comments    Pt with noted change in cognition (especially command follow) with increased tremoring especially with intentional tasks like eating. Pt asleep on entry, wakes easily, but is confused. PT/OT defer transfer to chair due to confusion. Goal of session to work on functional mobility especially with bed mobility. Started with longsitting in bed to work on core strength pt with decreased ability to hold himself upright with use of bed rails. Assisted to EoB, where for about 10 minutes pt able to carry on conversation and follow simple commands. When attempting to work on scooting back in bed. Pt could not follow one step commands. Once back in supine bed placed in chair position and pt set up for lunch. Pt with increased difficulty with self feeding due to tremors and decreased targeting. Despite OT building up spoon and eventually NT needed to assist with eating. D/c plans remain appropriate. PT will continue to follow acutely.     Follow Up Recommendations  CIR     Equipment Recommendations  Other (comment) (TBD at next venue)       Precautions / Restrictions Precautions Precautions: Fall Precaution Comments: R BKA, s/p 9/22 L BKA Restrictions Weight Bearing Restrictions: Yes RLE Weight Bearing: Non weight bearing LLE Weight Bearing: Non weight bearing Other Position/Activity Restrictions: NWB conservatively due to osteomyelitis of heel    Mobility  Bed Mobility Overal bed mobility: Needs Assistance Bed Mobility: Supine to Sit;Rolling;Sit to  Supine Rolling: Mod assist;+2 for safety/equipment   Supine to sit: Mod assist;+2 for physical assistance Sit to supine: Mod assist;+2 for physical assistance;+2 for safety/equipment   General bed mobility comments: modA +2 for for coming to longsitting in bed, and mod A for safe return supine. multimodal cues for rolling   Transfers                 General transfer comment: Transfer declined this session        Balance Overall balance assessment: Needs assistance Sitting-balance support: Feet unsupported;Bilateral upper extremity supported Sitting balance-Leahy Scale: Poor Sitting balance - Comments: initially mod to max A for long sitting in bed, progressed to min guard, then sat EOB and required similiar assist with progression from mod A to min guard  Postural control: Posterior lean;Left lateral lean                                  Cognition Arousal/Alertness: Awake/alert Behavior During Therapy: WFL for tasks assessed/performed Overall Cognitive Status: Impaired/Different from baseline Area of Impairment: Attention;Following commands;Problem solving;Awareness;Safety/judgement                   Current Attention Level: Selective   Following Commands: Follows multi-step commands inconsistently;Follows one step commands with increased time Safety/Judgement: Decreased awareness of safety;Decreased awareness of deficits Awareness: Emergent Problem Solving: Difficulty sequencing;Requires verbal cues;Requires tactile cues;Decreased initiation General Comments: initially Pt lethargic with improved command following once seated EOB, but unable to sequence or initiate tasks without visual or physical cues  Exercises Other Exercises Other Exercises: dynamic balance activities EOB twisting, core Other Exercises: BUE and balance activities EOB    General Comments General comments (skin integrity, edema, etc.): VSS on RA, increased intensional  tremors especially with feeding       Pertinent Vitals/Pain Pain Assessment: No/denies pain Pain Intervention(s): Monitored during session;Repositioned           PT Goals (current goals can now be found in the care plan section) Acute Rehab PT Goals Patient Stated Goal: none stated today PT Goal Formulation: With patient Time For Goal Achievement: 11/26/19 Potential to Achieve Goals: Fair Progress towards PT goals: Not progressing toward goals - comment    Frequency    Min 3X/week      PT Plan Current plan remains appropriate    Co-evaluation PT/OT/SLP Co-Evaluation/Treatment: Yes Reason for Co-Treatment: Necessary to address cognition/behavior during functional activity PT goals addressed during session: Mobility/safety with mobility        AM-PAC PT "6 Clicks" Mobility   Outcome Measure  Help needed turning from your back to your side while in a flat bed without using bedrails?: A Lot Help needed moving from lying on your back to sitting on the side of a flat bed without using bedrails?: A Lot Help needed moving to and from a bed to a chair (including a wheelchair)?: Total Help needed standing up from a chair using your arms (e.g., wheelchair or bedside chair)?: Total Help needed to walk in hospital room?: Total Help needed climbing 3-5 steps with a railing? : Total 6 Click Score: 8    End of Session   Activity Tolerance: Patient tolerated treatment well;Other (comment) (limited by decreased command follow) Patient left: in bed;with call bell/phone within reach;with bed alarm set;with nursing/sitter in room Nurse Communication: Mobility status PT Visit Diagnosis: Other abnormalities of gait and mobility (R26.89);Muscle weakness (generalized) (M62.81);Difficulty in walking, not elsewhere classified (R26.2)     Time: 5003-7048 PT Time Calculation (min) (ACUTE ONLY): 51 min  Charges:  $Therapeutic Exercise: 8-22 mins $Therapeutic Activity: 8-22 mins                      Aldon Hengst B. Migdalia Dk PT, DPT Acute Rehabilitation Services Pager [phone] Office [phone]    [place] 11/18/2019, 2:47 PM

## 2019-11-18 NOTE — Progress Notes (Signed)
RN text paged [facility] to notify that patient's foley was leaking.  Urine noted on bed pad when repositioning patient.  No kinks in foley tubing.  Urine noted in foley tubing and in foley bag.  Bladder scan showed less than 245mL in bladder.

## 2019-11-18 NOTE — Progress Notes (Signed)
PROGRESS NOTE    Anthony Dunlap  KGU:[id number] DOB: 1933/07/13 DOA: 11/05/2019 PCP: [name], MD   Chief Complaint  Patient presents with  . possible sepsis   Brief Narrative: 86 year old with DM2, prostate cancer, severe PVD followed by [name] status post right BKA and history of hip osteomyelitis on chronic suppressive antibiotic therapy was admitted from SNFfor fever and new onset A. fib with RVR. On admission patient was hemodynamically stable but his hemoglobin was noted to be markedly decreased from last month. History per patient's daughter who is his caregiver noted that he had been having black stool for a week prior to that. Patient was not started on anticoagulation anticoagulation for his atrial fibrillation. Patient was transfused 1 unit PRBC. Patient was seen by cardiology after being placed on a Cardizem drip. Cardizem was discontinued and patient was placed on Lopressor with reasonable control of his heart rate. Patient was seen by [name] who also follows him as an outpatient. He underwent an MRI which revealed osteomyelitis of his left heel. Skin surgery was called to consult regarding blood flow. Patient underwent EGD on 11/09/2019 which was unrevealing. Vascular has advised amputation for left heel wound/osteo and he agreed for surgery on 9/22 And underwent left BKA Postop did well.  Antibiotics were discontinued as per ID recommendation.  Pain is stable.  H/h at 8.0 gm Seen by PT OT at this time plan is for inpatient rehabilitation   Subjective: Seen this morning had difficulty swallowing Cardizem CD and was changed to IR.  Patient denies any pain.  His daughter called and we discussed over the phone.   He denied any complaint.    Assessment & Plan:  Left heel pressure ulcer with underlying calcaneal osteomyelitis SEG:BTDV by ID and [name] was on Dapto/Unasyn. 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   Continue PT OT, SNF placement.  Vascular surgery recommendingfollow-up 3 to 4 weeks for staple removal. Will benefit with inpatient rehab placement as patient returned from SNF needing further amputation  Sepsis POA 2/2 #1:as evident by fever, tachypnea, tachycardia and lactic acidosis. Hemodynamically stable.  A. fib with RVR: Remains in normal sinus rhythm, continue Eliquis and Plavix as per cardiology.Continue Cardizem IR changed from CD has difficulty with swallowing.  Continue Toprol.    Anemia of chronic disease with reported melena S/P EGD 9/20 was normal.  Hemoglobin improving.Tolerating Eliquis. Continue PPI. Seen by GI and had EGD.[name] Recent Labs  Lab 11/12/19 0622 11/12/19 1601 11/13/19 0633 11/14/19 1045 11/17/19 0143  HGB 7.2* 7.1* 7.9* 8.0* 8.4*  HCT 25.0* 24.4* 26.9* 26.6* 27.9*   Pericardial effusion, small:no further work-up as per cardiology.   Hypokalemia/hypomagnesemia: resolved  PVD/Lt BKA, and Right VOH:YWVP plavix  DM2:uncontrolled hba1c at 8.5. no more  hypoglycemia.Blood sugar borderline controlled increase Lantus to 6 units  And cont ssi Recent Labs  Lab 11/17/19 1120 11/17/19 1544 11/17/19 2206 11/18/19 0745 11/18/19 1151  GLUCAP 206* 236* 204* 162* 193*   Anxiety/Depression: Mood is stable continue home amitriptyline.  Primidone discontinued due to interaction with Eliquis after discussion with the patient by pharmacy  H eight. 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  Hx of prostate cancer/XRT therapy/Chronic foley-he reports it has been there for 1 and half years.spoke w/ Dr Abner Greenspan- leakage around foley for few days-he had his Foley taken care of by upsizing with cystoscopy by urology 9/22.  Overnight some urine Foley leakage.  Monitor closely if he has persistent issues notify urology cont on ditropan. He sees [name] as OP.  DVT prophylaxis: SCD's Start: 11/11/19 1446 SCDs Start: 11/05/19 2016 Code Status:   Code Status: Full Code   Family Communication: plan of care discussed with patient at bedside. I have discussed patient's daughter previously. CIR team spoke w/ daughter I spoke with patient's daughter today.  Status is: Inpatient Remains inpatient appropriate because:Inpatient level of care appropriate due to severity of illness  Dispo: The patient is from: Home/SNF              Anticipated d/c is to: CIR-peer to peer review done and CIR denied and appeal is in process. Patient is high risk for readmission, will benefit with CIR placement               Anticipated d/c date is: Once bed available              Patient currently medically stable for d/c. Nutrition: Diet Order            Diet heart healthy/carb modified Room service appropriate? Yes; Fluid consistency: Thin  Diet effective now                Body mass index is 23.53 kg/m. Pressure Ulcer: Pressure Injury 11/09/19 Buttocks Right;Proximal Stage 2 -  Partial thickness loss of dermis presenting as a shallow open injury with a red, pink wound bed without slough. (Active)  11/09/19 1915  Location: Buttocks  Location Orientation: Right;Proximal  Staging: Stage 2 -  Partial thickness loss of dermis presenting as a shallow open injury with a red, pink wound bed without slough.  Wound Description (Comments):   Present on Admission:    Consultants:see note  Procedures:see note Microbiology:see note Blood Culture    Component Value Date/Time   SDES BLOOD 11/05/2019 1212   SPECREQUEST NONE 11/05/2019 1212   CULT  11/05/2019 1212    NO GROWTH 5 DAYS Performed at [facility], [address], [place], [place] [address]    REPTSTATUS 11/10/2019 FINAL 11/05/2019 1212    Other culture-see note  Medications: Scheduled Meds: . sodium chloride   Intravenous Once  . sodium chloride   Intravenous Once  . amitriptyline  20 mg Oral QHS  . apixaban  5 mg Oral BID  . Chlorhexidine Gluconate Cloth  6 each Topical Daily  . clopidogrel  75 mg Oral Daily   . diltiazem  60 mg Oral Q6H  . docusate  100 mg Oral Daily  . ferrous sulfate  325 mg Oral Q breakfast  . insulin aspart  0-5 Units Subcutaneous QHS  . insulin aspart  0-9 Units Subcutaneous TID WC  . insulin glargine  6 Units Subcutaneous QHS  . levothyroxine  100 mcg Oral Q breakfast  . metoprolol succinate  50 mg Oral Daily  . multivitamin with minerals  1 tablet Oral Daily  . oxybutynin  5 mg Oral QHS  . pantoprazole  40 mg Oral QHS  . polyethylene glycol  17 g Oral Daily  . pravastatin  10 mg Oral Q supper  . tobramycin  2 drop Both Eyes Q4H while awake   Continuous Infusions: . sodium chloride 75 mL/hr at 11/15/19 2151  . magnesium sulfate bolus IVPB      Antimicrobials: Anti-infectives (From admission, onward)   Start     Dose/Rate Route Frequency Ordered Stop   11/11/19 1500  ceFAZolin (ANCEF) IVPB 2g/100 mL premix  Status:  Discontinued  2 g 200 mL/hr over 30 Minutes Intravenous Every 8 hours 11/11/19 1446 11/11/19 1513   11/07/19 1200  DAPTOmycin (CUBICIN) 700 mg in sodium chloride 0.9 % IVPB  Status:  Discontinued        700 mg 228 mL/hr over 30 Minutes Intravenous Daily 11/07/19 1056 11/12/19 1141   11/07/19 1200  Ampicillin-Sulbactam (UNASYN) 3 g in sodium chloride 0.9 % 100 mL IVPB  Status:  Discontinued        3 g 200 mL/hr over 30 Minutes Intravenous Every 6 hours 11/07/19 1056 11/12/19 1141   11/06/19 1300  vancomycin (VANCOREADY) IVPB 1250 mg/250 mL  Status:  Discontinued        1,250 mg 166.7 mL/hr over 90 Minutes Intravenous Every 24 hours 11/05/19 1324 11/07/19 1030   11/06/19 0000  ceFEPIme (MAXIPIME) 2 g in sodium chloride 0.9 % 100 mL IVPB  Status:  Discontinued        2 g 200 mL/hr over 30 Minutes Intravenous Every 12 hours 11/05/19 1648 11/07/19 1030   11/05/19 2200  metroNIDAZOLE (FLAGYL) IVPB 500 mg  Status:  Discontinued        500 mg 100 mL/hr over 60 Minutes Intravenous Every 8 hours 11/05/19 1637 11/07/19 1030   11/05/19 1300   vancomycin (VANCOREADY) IVPB 2000 mg/400 mL        2,000 mg 200 mL/hr over 120 Minutes Intravenous  Once 11/05/19 1226 11/05/19 1453   11/05/19 1230  metroNIDAZOLE (FLAGYL) IVPB 500 mg        500 mg 100 mL/hr over 60 Minutes Intravenous  Once 11/05/19 1223 11/05/19 1345   11/05/19 1215  ceFEPIme (MAXIPIME) 2 g in sodium chloride 0.9 % 100 mL IVPB        2 g 200 mL/hr over 30 Minutes Intravenous  Once 11/05/19 1201 11/05/19 1254     Objective: Vitals: Today's Vitals   11/18/19 0800 11/18/19 0900 11/18/19 0943 11/18/19 1228  BP:  (!) 138/93  128/87  Pulse:  (!) 111 (!) 112 97  Resp:    19  Temp:    98.9 F (37.2 C)  TempSrc:    Oral  SpO2:    96%  Weight:      Height:      PainSc: 0-No pain       Intake/Output Summary (Last 24 hours) at 11/18/2019 1309 Last data filed at 11/18/2019 0500 Gross per 24 hour  Intake 200 ml  Output 1275 ml  Net -1075 ml   Filed Weights   11/15/19 0346 11/16/19 0433 11/18/19 0508  Weight: 79.4 kg 74.9 kg 70.2 kg   Weight change:   Intake/Output from previous day: 09/28 0701 - 09/29 0700 In: 200 [P.O.:200] Out: 1950 [Urine:1950] Intake/Output this shift: No intake/output data recorded.  Examination: General exam: AAO baseline, NAD, weak appearing. HEENT:Oral mucosa moist, Ear/Nose WNL grossly, dentition normal. Respiratory system: bilaterally clear,no wheezing or crackles,no use of accessory muscle Cardiovascular system: S1 & S2 +, No JVD,. Gastrointestinal system: Abdomen soft, NT,ND, BS+ Nervous System:Alert, awake, moving extremities and grossly nonfocal Extremities: Right BKA stump healing, left BKA stump with intact staples- has mild erythema around staples but no drainage  Skin: No rashes,no icterus. MSK: Normal muscle bulk,tone, power  Data Reviewed: I have personally reviewed following labs and imaging studies CBC: Recent Labs  Lab 11/11/19 1747 11/11/19 1747 11/12/19 0622 11/12/19 1601 11/13/19 0633 11/14/19 1045  11/17/19 0143  WBC 13.2*  --  12.0*  --  8.3 7.8 10.5  NEUTROABS  --   --   --   --   --  5.6  --   HGB 7.4*   < > 7.2* 7.1* 7.9* 8.0* 8.4*  HCT 24.7*   < > 25.0* 24.4* 26.9* 26.6* 27.9*  MCV 85.5  --  86.2  --  86.2 85.3 81.8  PLT 568*  --  580*  --  542* 607* 700*   < > = values in this interval not displayed.   Basic Metabolic Panel: Recent Labs  Lab 11/11/19 1747 11/12/19 0622 11/13/19 0633 11/14/19 0840 11/17/19 0143  NA 137 138 136 136 134*  K 4.5 4.3 4.1 3.9 3.5  CL 108 107 106 103 100  CO2 21* 22 20* 22 24  GLUCOSE 222* 81 79 132* 187*  BUN 9 10 10 9 9   CREATININE 0.90 0.95 0.93 0.94 0.87  CALCIUM 7.7* 7.8* 7.9* 8.0* 8.3*   GFR: Estimated Creatinine Clearance: 61.1 mL/min (by C-G formula based on SCr of 0.87 mg/dL). Liver Function Tests: Recent Labs  Lab 11/11/19 1747  AST 16  ALT 14  ALKPHOS 115  BILITOT 0.4  PROT 5.2*  ALBUMIN 1.9*   No results for input(s): LIPASE, AMYLASE in the last 168 hours. No results for input(s): AMMONIA in the last 168 hours. Coagulation Profile: No results for input(s): INR, PROTIME in the last 168 hours. Cardiac Enzymes: Recent Labs  Lab 11/13/19 0633  CKTOTAL 461*   BNP (last 3 results) No results for input(s): PROBNP in the last 8760 hours. HbA1C: No results for input(s): HGBA1C in the last 72 hours. CBG: Recent Labs  Lab 11/17/19 1120 11/17/19 1544 11/17/19 2206 11/18/19 0745 11/18/19 1151  GLUCAP 206* 236* 204* 162* 193*   Lipid Profile: No results for input(s): CHOL, HDL, LDLCALC, TRIG, CHOLHDL, LDLDIRECT in the last 72 hours. Thyroid Function Tests: No results for input(s): TSH, T4TOTAL, FREET4, T3FREE, THYROIDAB in the last 72 hours. Anemia Panel: No results for input(s): VITAMINB12, FOLATE, FERRITIN, TIBC, IRON, RETICCTPCT in the last 72 hours. Sepsis Labs: No results for input(s): PROCALCITON, LATICACIDVEN in the last 168 hours.  No results found for this or any previous visit (from the past 240  hour(s)).   Radiology Studies: No results found.   LOS: 13 days   Antonieta Pert, MD [facility]  11/18/2019, 1:09 PM

## 2019-11-18 NOTE — Progress Notes (Signed)
Occupational Therapy Treatment Patient Details Name: Anthony Dunlap MRN: [id number] DOB: [birth year] Today's Date: 11/18/2019    History of present illness Pt is an 86 y.o. male admitted from SNF on 11/05/19 with sepsis, nonhealing L foot ulcer, afib with RVR. 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  Post L BKA 9/22.   OT comments  PT WITH NOTED CHANGE IN MENTAL AND FUNCTIONAL PERFORMANCE TODAY from previous sessions. Pt initially very lethargic, not following single commands. Mentation did improve sitting EOB and working on dynamic balance and weight shifting that will be essential for WC function. Pt able to progress from mod A to min guard sitting EOB. Pt with decreased ROM in BUE and tremor noted. This became especially apparent after returning Pt to a chair position in the bed for eating lunch. Pt with jerky movements in BUE, unable to hold wash cloth or spoon. OT built up handle, and was unable to coordinate movements to self-feed. Pt was also unable to hold a cup against his chest with BUE and drink out of straw. NT in room to assist with feeding the rest of lunch. RN made aware of changes. OT will continue to follow acutely. Pt continues to require post-acute OT. Next session to look at [place] and function as well as attempt OOB if Pt following directions better. Pt was very pleasant and motivated overall.   Follow Up Recommendations  CIR;Supervision/Assistance - 24 hour    Equipment Recommendations  3 in 1 bedside commode;Wheelchair (measurements OT);Wheelchair cushion (measurements OT);Hospital bed    Recommendations for Other Services      Precautions / Restrictions Precautions Precautions: Fall Precaution Comments: R BKA, s/p 9/22 L BKA Restrictions Weight Bearing Restrictions: Yes RLE Weight Bearing: Non weight bearing LLE Weight Bearing: Non weight bearing Other  Position/Activity Restrictions: NWB conservatively due to osteomyelitis of heel       Mobility Bed Mobility Overal bed mobility: Needs Assistance Bed Mobility: Supine to Sit;Rolling;Sit to Supine Rolling: Mod assist;+2 for safety/equipment   Supine to sit: Mod assist;+2 for physical assistance Sit to supine: Mod assist;+2 for physical assistance;+2 for safety/equipment   General bed mobility comments: modA +2 for for coming to longsitting in bed, and mod A for safe return supine. multimodal cues for rolling   Transfers                 General transfer comment: Transfer declined this session    Balance Overall balance assessment: Needs assistance Sitting-balance support: Feet unsupported;Bilateral upper extremity supported Sitting balance-Leahy Scale: Poor Sitting balance - Comments: initially mod to max A for long sitting in bed, progressed to min guard, then sat EOB and required similiar assist with progression from mod A to min guard  Postural control: Posterior lean;Left lateral lean                                 ADL either performed or assessed with clinical judgement   ADL Overall ADL's : Needs assistance/impaired Eating/Feeding: Maximal assistance;Bed level (HOB elevated) Eating/Feeding Details (indicate cue type and reason): tremor/ataxic like movements, decreased grasp - created built up handles, but Pt continued to struggle to self feed, lacking supination and pronation currently. frequently splling contents from spoon Grooming: Wash/dry face;Moderate assistance;Sitting Grooming Details (indicate cue type and reason): able to initially bring to face - struggled to  maintain grasp on the wash cloth, ultimately required mod A to wash face hand over hand                               General ADL Comments: cannot assist with scooting, decreased BUE function for self-care tasks     Vision       Perception     Praxis      Cognition  Arousal/Alertness: Awake/alert Behavior During Therapy: WFL for tasks assessed/performed Overall Cognitive Status: Impaired/Different from baseline Area of Impairment: Attention;Following commands;Problem solving;Awareness;Safety/judgement                   Current Attention Level: Selective   Following Commands: Follows multi-step commands inconsistently;Follows one step commands with increased time Safety/Judgement: Decreased awareness of safety;Decreased awareness of deficits Awareness: Emergent Problem Solving: Difficulty sequencing;Requires verbal cues;Requires tactile cues;Decreased initiation General Comments: initially Pt lethargic with improved command following once seated EOB, but unable to sequence or initiate tasks without visual or physical cues        Exercises Exercises: Other exercises Other Exercises Other Exercises: dynamic balance activities EOB twisting, core Other Exercises: BUE and balance activities EOB   Shoulder Instructions       General Comments      Pertinent Vitals/ Pain       Pain Assessment: No/denies pain Pain Intervention(s): Monitored during session  Home Living                                          Prior Functioning/Environment              Frequency  Min 2X/week        Progress Toward Goals  OT Goals(current goals can now be found in the care plan section)  Progress towards OT goals: Not progressing toward goals - comment (change in mental and functional performance)  Acute Rehab OT Goals Patient Stated Goal: none stated today OT Goal Formulation: With patient Time For Goal Achievement: 11/21/19 Potential to Achieve Goals: Good  Plan Discharge plan remains appropriate    Co-evaluation    PT/OT/SLP Co-Evaluation/Treatment: Yes Reason for Co-Treatment: Necessary to address cognition/behavior during functional activity;For patient/therapist safety;To address functional/ADL transfers           AM-PAC OT "6 Clicks" Daily Activity     Outcome Measure   Help from another person eating meals?: A Lot Help from another person taking care of personal grooming?: A Lot Help from another person toileting, which includes using toliet, bedpan, or urinal?: Total Help from another person bathing (including washing, rinsing, drying)?: A Lot Help from another person to put on and taking off regular upper body clothing?: A Lot Help from another person to put on and taking off regular lower body clothing?: Total 6 Click Score: 10    End of Session    OT Visit Diagnosis: Muscle weakness (generalized) (M62.81);Pain;Other symptoms and signs involving cognitive function Pain - Right/Left: Left Pain - part of body: Leg   Activity Tolerance Patient tolerated treatment well   Patient Left in bed;with call bell/phone within reach;with bed alarm set;with nursing/sitter in room (NT feeding him)   Nurse Communication Mobility status;Other (comment) (concern over cognitive and functional decline)        Time: 1610-9604 OT Time Calculation (min): 51 min  Charges: OT General Charges $  OT Visit: 1 Visit OT Treatments $Self Care/Home Management : 8-22 mins  Jesse Sans OTR/L Acute Rehabilitation Services Pager: [phone] Office: [place] 11/18/2019, 1:13 PM

## 2019-11-19 ENCOUNTER — Inpatient Hospital Stay (HOSPITAL_COMMUNITY): Payer: Medicare Other

## 2019-11-19 DIAGNOSIS — M86172 Other acute osteomyelitis, left ankle and foot: Secondary | ICD-10-CM | POA: Diagnosis not present

## 2019-11-19 LAB — CBC
HCT: 28.9 % — ABNORMAL LOW (ref 39.0–52.0)
Hemoglobin: 8.7 g/dL — ABNORMAL LOW (ref 13.0–17.0)
MCH: 24.4 pg — ABNORMAL LOW (ref 26.0–34.0)
MCHC: 30.1 g/dL (ref 30.0–36.0)
MCV: 81 fL (ref 80.0–100.0)
Platelets: 624 10*3/uL — ABNORMAL HIGH (ref 150–400)
RBC: 3.57 MIL/uL — ABNORMAL LOW (ref 4.22–5.81)
RDW: 15.7 % — ABNORMAL HIGH (ref 11.5–15.5)
WBC: 11.5 10*3/uL — ABNORMAL HIGH (ref 4.0–10.5)
nRBC: 0 % (ref 0.0–0.2)

## 2019-11-19 LAB — PROCALCITONIN: Procalcitonin: 0.18 ng/mL

## 2019-11-19 LAB — COMPREHENSIVE METABOLIC PANEL
ALT: 28 U/L (ref 0–44)
AST: 52 U/L — ABNORMAL HIGH (ref 15–41)
Albumin: 2.2 g/dL — ABNORMAL LOW (ref 3.5–5.0)
Alkaline Phosphatase: 145 U/L — ABNORMAL HIGH (ref 38–126)
Anion gap: 12 (ref 5–15)
BUN: 17 mg/dL (ref 8–23)
CO2: 22 mmol/L (ref 22–32)
Calcium: 8.3 mg/dL — ABNORMAL LOW (ref 8.9–10.3)
Chloride: 97 mmol/L — ABNORMAL LOW (ref 98–111)
Creatinine, Ser: 1.15 mg/dL (ref 0.61–1.24)
GFR calc Af Amer: >60 mL/min (ref >60)
GFR calc non Af Amer: 58 mL/min — ABNORMAL LOW (ref >60)
Glucose, Bld: 279 mg/dL — ABNORMAL HIGH (ref 70–99)
Potassium: 3.8 mmol/L (ref 3.5–5.1)
Sodium: 131 mmol/L — ABNORMAL LOW (ref 135–145)
Total Bilirubin: 0.4 mg/dL (ref 0.3–1.2)
Total Protein: 6.7 g/dL (ref 6.5–8.1)

## 2019-11-19 LAB — URINALYSIS, ROUTINE W REFLEX MICROSCOPIC
Bilirubin Urine: NEGATIVE
Glucose, UA: NEGATIVE mg/dL
Ketones, ur: NEGATIVE mg/dL
Nitrite: NEGATIVE
Protein, ur: 30 mg/dL — AB
Specific Gravity, Urine: 1.018 (ref 1.005–1.030)
WBC, UA: >50 WBC/hpf — ABNORMAL HIGH (ref 0–5)
pH: 5 (ref 5.0–8.0)

## 2019-11-19 LAB — GLUCOSE, CAPILLARY
Glucose-Capillary: 200 mg/dL — ABNORMAL HIGH (ref 70–99)
Glucose-Capillary: 221 mg/dL — ABNORMAL HIGH (ref 70–99)
Glucose-Capillary: 294 mg/dL — ABNORMAL HIGH (ref 70–99)
Glucose-Capillary: 248 mg/dL — ABNORMAL HIGH (ref 70–99)
Glucose-Capillary: 284 mg/dL — ABNORMAL HIGH (ref 70–99)

## 2019-11-19 MED ORDER — LACTATED RINGERS IV SOLN: INTRAVENOUS | Status: AC

## 2019-11-19 MED ORDER — INSULIN GLARGINE 100 UNIT/ML ~~LOC~~ SOLN
8.0000 [IU] | Freq: Every day | SUBCUTANEOUS | Status: DC
  Administered 2019-11-19: 8 [IU] via SUBCUTANEOUS
  Filled 2019-11-19 (×2): qty 0.08

## 2019-11-19 NOTE — Progress Notes (Signed)
Inpatient Rehab Admissions Coordinator:    Peer to peer, which was completed on Tuesday, resulted leaving initial denial was upheld; however, the expedited appeal for CIR Josem Kaufmann is still pending. Discussed with daughter, [name], over the phone.   Shann Medal, PT, DPT Admissions Coordinator [phone] 11/19/19  10:52 AM

## 2019-11-19 NOTE — Progress Notes (Addendum)
Anthony NOTE    PARKS CZAJKOWSKI  WIO:[id number] DOB: 01/22/1934 DOA: 11/05/2019 PCP: [name], Anthony   Chief Complaint  Patient presents with  . possible sepsis   Brief Narrative: 86 year old with Dunlap, Anthony Dunlap, Anthony Dunlap therapy was admitted from SNFfor fever and new onset A. fib with RVR. On admission patient was hemodynamically stable but his hemoglobin was noted to be markedly decreased from last month. History per patient's daughter who is his caregiver noted that he had been having black stool for a week prior to that. Patient was not started on anticoagulation anticoagulation for his atrial fibrillation. Patient was transfused 1 unit PRBC. Patient was seen by cardiology after being placed on a Cardizem drip. Cardizem was discontinued and patient was placed on Lopressor with reasonable control of his heart rate. Patient was seen by [name] who also follows him as an outpatient. He underwent an MRI which revealed osteomyelitis of his left heel. Skin surgery was called to consult regarding blood flow. Patient underwent EGD on 11/09/2019 which was unrevealing. Vascular has advised amputation for left heel wound/osteo and he agreed for surgery on 9/22 And underwent left BKA Postop did well.  Antibiotics were discontinued as per ID recommendation.  Pain is stable.  H/h at 8.0 gm Seen by PT OT at this time plan is for inpatient rehabilitation   Subjective: Seen this morning.  He has no new complaints.  Resting comfortably.  His rhythm was normal sinus rhythm.    Assessment & Plan:  Left heel pressure ulcer with underlying calcaneal osteomyelitis LAG:TXMI by ID and [name] was on Dapto/Unasyn. 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  Continue PT OT, SNF placement.  Vascular surgery recommendingfollow-up 3 to 4 weeks for  staple removal.  Related vascular surgery no to evaluate the system prior to discharge noted mild redness around staples.  Sepsis POA 2/2 #1:as evident by fever, tachypnea, tachycardia and lactic acidosis. Hemodynamically stable.  A. fib with RVR: In sinus rhythm continue Eliquis and Plavix as per cardiology.Continue Cardizem IR changed from CD has difficulty with swallowing.  Continue Toprol.    Anemia of chronic disease with reported melena S/P EGD 9/20 was normal.  Hemoglobin improving.Tolerating Eliquis. Continue PPI. Seen by GI and had EGD.  Repeat CBC in a week. Recent Labs  Lab 11/12/19 1601 11/13/19 0633 11/14/19 1045 11/17/19 0143  HGB 7.1* 7.9* 8.0* 8.4*  HCT 24.4* 26.9* 26.6* 27.9*   Pericardial effusion, small:no further work-up as per cardiology.   Hypokalemia/hypomagnesemia: Resolved  PVD/Lt BKA, and Right WOE:HOZY plavix  Dunlap:uncontrolled hba1c at 8.5. no more  Hypoglycemia.Sugar borderline controlled, increase to 8 units Lantus and sliding scale insulin.   Recent Labs  Lab 11/18/19 1721 11/18/19 2100 11/19/19 0058 11/19/19 0754 11/19/19 1216  GLUCAP 257* 241* 200* 221* 294*   Anxiety/Depression: Mood is stable continue home amitriptyline.  Primidone discontinued due to interaction with Eliquis after discussion with the patient by pharmacy  H eight. 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  Hx of Anthony Dunlap/XRT therapy/Chronic foley-he reports it has been there for 1 and half years.spoke w/ Dr Abner Greenspan- leakage around foley for few days-he had his Foley taken care of by upsizing with cystoscopy by urology 9/22.  Overnight some urine Foley leakage.  Monitor closely if he has persistent issues notify urology cont on  ditropan. He sees [name] as OP.  This evening has temp 101.2, informed by RN. mildly tachypneic Foley leaking- urology reconsulted as daughter was also asking for suprapubic catheter. On RA at 96% Yesterday some difficulty with  taking meds. Will obtain cxr, [place], blood cultures. Labs, procal. Stump area reddish-no drainage Vascular will be notified to take a look/wound check. Add gentle ivf.  DVT prophylaxis: SCD's Start: 11/11/19 1446 SCDs Start: 11/05/19 2016 Code Status:   Code Status: Full Code  Family Communication: plan of care discussed with patient at bedside. I have discussed patient's daughter previously. CIR team spoke w/ daughter I spoke with patient's daughter today.  Status is: Inpatient Remains inpatient appropriate because:Inpatient level of care appropriate due to severity of illness  Dispo: The patient is from: Home/SNF              Anticipated d/c is to: SNF versus home health.  Waiting for family decision as appeal has been denied.                Anticipated d/c date is: awaiting family decision.              Patient currently medically stable for d/c. Nutrition: Diet Order            Diet heart healthy/carb modified Room service appropriate? Yes; Fluid consistency: Thin  Diet effective now                Body mass index is 22.76 kg/m. Pressure Ulcer: Pressure Injury 11/09/19 Buttocks Right;Proximal Stage 2 -  Partial thickness loss of dermis presenting as a shallow open injury with a red, pink wound bed without slough. (Active)  11/09/19 1915  Location: Buttocks  Location Orientation: Right;Proximal  Staging: Stage 2 -  Partial thickness loss of dermis presenting as a shallow open injury with a red, pink wound bed without slough.  Wound Description (Comments):   Present on Admission:    Consultants:see note  Procedures:see note Microbiology:see note Blood Culture    Component Value Date/Time   SDES BLOOD 11/05/2019 1212   SPECREQUEST NONE 11/05/2019 1212   CULT  11/05/2019 1212    NO GROWTH 5 DAYS Performed at [facility], [address] [place], [place] [address]    REPTSTATUS 11/10/2019 FINAL 11/05/2019 1212    Other culture-see note  Medications: Scheduled  Meds: . sodium chloride   Intravenous Once  . sodium chloride   Intravenous Once  . amitriptyline  20 mg Oral QHS  . apixaban  5 mg Oral BID  . Chlorhexidine Gluconate Cloth  6 each Topical Daily  . clopidogrel  75 mg Oral Daily  . diltiazem  60 mg Oral Q6H  . docusate  100 mg Oral Daily  . ferrous sulfate  325 mg Oral Q breakfast  . insulin aspart  0-5 Units Subcutaneous QHS  . insulin aspart  0-9 Units Subcutaneous TID WC  . insulin glargine  6 Units Subcutaneous QHS  . levothyroxine  100 mcg Oral Q breakfast  . metoprolol succinate  50 mg Oral Daily  . multivitamin with minerals  1 tablet Oral Daily  . oxybutynin  5 mg Oral QHS  . pantoprazole  40 mg Oral QHS  . polyethylene glycol  17 g Oral Daily  . pravastatin  10 mg Oral Q supper  . tobramycin  2 drop Both Eyes Q4H while awake   Continuous Infusions: . sodium chloride 75 mL/hr at 11/15/19 2151  . magnesium sulfate bolus IVPB  Antimicrobials: Anti-infectives (From admission, onward)   Start     Dose/Rate Route Frequency Ordered Stop   11/11/19 1500  ceFAZolin (ANCEF) IVPB 2g/100 mL premix  Status:  Discontinued        2 g 200 mL/hr over 30 Minutes Intravenous Every 8 hours 11/11/19 1446 11/11/19 1513   11/07/19 1200  DAPTOmycin (CUBICIN) 700 mg in sodium chloride 0.9 % IVPB  Status:  Discontinued        700 mg 228 mL/hr over 30 Minutes Intravenous Daily 11/07/19 1056 11/12/19 1141   11/07/19 1200  Ampicillin-Sulbactam (UNASYN) 3 g in sodium chloride 0.9 % 100 mL IVPB  Status:  Discontinued        3 g 200 mL/hr over 30 Minutes Intravenous Every 6 hours 11/07/19 1056 11/12/19 1141   11/06/19 1300  vancomycin (VANCOREADY) IVPB 1250 mg/250 mL  Status:  Discontinued        1,250 mg 166.7 mL/hr over 90 Minutes Intravenous Every 24 hours 11/05/19 1324 11/07/19 1030   11/06/19 0000  ceFEPIme (MAXIPIME) 2 g in sodium chloride 0.9 % 100 mL IVPB  Status:  Discontinued        2 g 200 mL/hr over 30 Minutes Intravenous Every  12 hours 11/05/19 1648 11/07/19 1030   11/05/19 2200  metroNIDAZOLE (FLAGYL) IVPB 500 mg  Status:  Discontinued        500 mg 100 mL/hr over 60 Minutes Intravenous Every 8 hours 11/05/19 1637 11/07/19 1030   11/05/19 1300  vancomycin (VANCOREADY) IVPB 2000 mg/400 mL        2,000 mg 200 mL/hr over 120 Minutes Intravenous  Once 11/05/19 1226 11/05/19 1453   11/05/19 1230  metroNIDAZOLE (FLAGYL) IVPB 500 mg        500 mg 100 mL/hr over 60 Minutes Intravenous  Once 11/05/19 1223 11/05/19 1345   11/05/19 1215  ceFEPIme (MAXIPIME) 2 g in sodium chloride 0.9 % 100 mL IVPB        2 g 200 mL/hr over 30 Minutes Intravenous  Once 11/05/19 1201 11/05/19 1254     Objective: Vitals: Today's Vitals   11/19/19 0923 11/19/19 1100 11/19/19 1201 11/19/19 1233  BP:  119/77 139/81 139/81  Pulse:  99 100   Resp:   20   Temp:   98.9 F (37.2 C)   TempSrc:   Oral   SpO2:   96%   Weight:      Height:      PainSc: 0-No pain       Intake/Output Summary (Last 24 hours) at 11/19/2019 1451 Last data filed at 11/19/2019 1256 Gross per 24 hour  Intake 800 ml  Output 675 ml  Net 125 ml   Filed Weights   11/16/19 0433 11/18/19 0508 11/19/19 0630  Weight: 74.9 kg 70.2 kg 67.9 kg   Weight change: -2.3 kg  Intake/Output from previous day: 09/29 0701 - 09/30 0700 In: 560 [P.O.:560] Out: 675 [Urine:675] Intake/Output this shift: Total I/O In: 600 [P.O.:600] Out: -   Examination: General exam: AAO at baseline , NAD, weak appearing. HEENT:Oral mucosa moist, Ear/Nose WNL grossly, dentition normal. Respiratory system: bilaterally clear,no wheezing or crackles,no use of accessory muscle Cardiovascular system: S1 & S2 +, No JVD,. Gastrointestinal system: Abdomen soft, NT,ND, BS+ Nervous System:Alert, awake, moving extremities and grossly nonfocal Extremities: Bilateral BKA -left  bka with intact staples,right with healing bka. Skin: No rashes,no icterus. MSK: Normal muscle bulk,tone, power    Data  Reviewed: I have personally reviewed following  labs and imaging studies CBC: Recent Labs  Lab 11/12/19 1601 11/13/19 0633 11/14/19 1045 11/17/19 0143  WBC  --  8.3 7.8 10.5  NEUTROABS  --   --  5.6  --   HGB 7.1* 7.9* 8.0* 8.4*  HCT 24.4* 26.9* 26.6* 27.9*  MCV  --  86.2 85.3 81.8  PLT  --  542* 607* 559*   Basic Metabolic Panel: Recent Labs  Lab 11/13/19 0633 11/14/19 0840 11/17/19 0143  NA 136 136 134*  K 4.1 3.9 3.5  CL 106 103 100  CO2 20* 22 24  GLUCOSE 79 132* 187*  BUN 10 9 9   CREATININE 0.93 0.94 0.87  CALCIUM 7.9* 8.0* 8.3*   GFR: Estimated Creatinine Clearance: 60.7 mL/min (by C-G formula based on SCr of 0.87 mg/dL). Liver Function Tests: No results for input(s): AST, ALT, ALKPHOS, BILITOT, PROT, ALBUMIN in the last 168 hours. No results for input(s): LIPASE, AMYLASE in the last 168 hours. No results for input(s): AMMONIA in the last 168 hours. Coagulation Profile: No results for input(s): INR, PROTIME in the last 168 hours. Cardiac Enzymes: Recent Labs  Lab 11/13/19 0633  CKTOTAL 461*   BNP (last 3 results) No results for input(s): PROBNP in the last 8760 hours. HbA1C: No results for input(s): HGBA1C in the last 72 hours. CBG: Recent Labs  Lab 11/18/19 1721 11/18/19 2100 11/19/19 0058 11/19/19 0754 11/19/19 1216  GLUCAP 257* 241* 200* 221* 294*   Lipid Profile: No results for input(s): CHOL, HDL, LDLCALC, TRIG, CHOLHDL, LDLDIRECT in the last 72 hours. Thyroid Function Tests: No results for input(s): TSH, T4TOTAL, FREET4, T3FREE, THYROIDAB in the last 72 hours. Anemia Panel: No results for input(s): VITAMINB12, FOLATE, FERRITIN, TIBC, IRON, RETICCTPCT in the last 72 hours. Sepsis Labs: No results for input(s): PROCALCITON, LATICACIDVEN in the last 168 hours.  No results found for this or any previous visit (from the past 240 hour(s)).   Radiology Studies: No results found.   LOS: 14 days   Antonieta Pert, Anthony [facility]  11/19/2019, 2:51 PM

## 2019-11-19 NOTE — Progress Notes (Signed)
Foley bag emptied for 126mL.  Patient's foley has been leaking as evidenced by urine noted on bed pad twice throughout night shift, moderate amount of urine.  Bladder scan completed and was 21mL  Will pass on to day shift RN to notify primary team.

## 2019-11-19 NOTE — Progress Notes (Signed)
Inpatient Rehab Admissions Coordinator:   Notified by insurance company that expedited appeal has been completed and the denial has been upheld.  Pt will need to seek therapy in a lower level of care.  I did let Dr. Lupita Leash, daughter, [name], and [facility] team know.   Shann Medal, PT, DPT Admissions Coordinator [phone] 11/19/19  11:31 AM

## 2019-11-19 NOTE — Progress Notes (Signed)
[facility]  Subjective  - says his left BKA feels ok, no significant pain.   Objective 121/72 95 (!) 101.2 F (38.4 C) (Oral) (!) 21 96%  Intake/Output Summary (Last 24 hours) at 11/19/2019 1755 Last data filed at 11/19/2019 1256 Gross per 24 hour  Intake 800 ml  Output 675 ml  Net 125 ml      Left BKA pictured above, no drainage, no blanching erythema  Laboratory Lab Results: Recent Labs    11/17/19 0143  WBC 10.5  HGB 8.4*  HCT 27.9*  PLT 700*   BMET Recent Labs    11/17/19 0143  NA 134*  K 3.5  CL 100  CO2 24  GLUCOSE 187*  BUN 9  CREATININE 0.87  CALCIUM 8.3*    COAG Lab Results  Component Value Date   INR 1.2 11/05/2019   INR 1.1 09/28/2019   INR 1.0 09/12/2018   No results found for: PTT  Assessment/Planning:  86 year old male status post left BKA on 11/11/2019 for critical limb ischemia with nonhealing wound even after revascularization.  I was called this evening to reevaluate his BKA given new fever and unclear source.  As seen above in the picture I do not see any overt signs of infection including no drainage or blanching erythema to suggest cellulitis.  He has a little bit of redness along the staple line that is typical from reaction to the staples.  Please call vascular surgery for reevaluation if any further changes in his exam and we would be happy to see him again.  Marty Heck 11/19/2019 5:55 PM --

## 2019-11-19 NOTE — Progress Notes (Signed)
Patient Temp 101.2 at 1614.  Dr Lupita Leash notified.  New orders placed by Dr Lupita Leash.   DR. Lupita Leash asked RN to notifiy [name].  [name] stated he would come assess patient.

## 2019-11-20 ENCOUNTER — Inpatient Hospital Stay (HOSPITAL_COMMUNITY): Payer: Medicare Other

## 2019-11-20 DIAGNOSIS — M86172 Other acute osteomyelitis, left ankle and foot: Secondary | ICD-10-CM | POA: Diagnosis not present

## 2019-11-20 LAB — HEMOGLOBIN AND HEMATOCRIT, BLOOD
HCT: 27.4 % — ABNORMAL LOW (ref 39.0–52.0)
HCT: 28.2 % — ABNORMAL LOW (ref 39.0–52.0)
Hemoglobin: 8.3 g/dL — ABNORMAL LOW (ref 13.0–17.0)
Hemoglobin: 8.5 g/dL — ABNORMAL LOW (ref 13.0–17.0)

## 2019-11-20 LAB — CBC
HCT: 27 % — ABNORMAL LOW (ref 39.0–52.0)
Hemoglobin: 8.2 g/dL — ABNORMAL LOW (ref 13.0–17.0)
MCH: 24.6 pg — ABNORMAL LOW (ref 26.0–34.0)
MCHC: 30.4 g/dL (ref 30.0–36.0)
MCV: 80.8 fL (ref 80.0–100.0)
Platelets: 625 10*3/uL — ABNORMAL HIGH (ref 150–400)
RBC: 3.34 MIL/uL — ABNORMAL LOW (ref 4.22–5.81)
RDW: 15.9 % — ABNORMAL HIGH (ref 11.5–15.5)
WBC: 13.5 10*3/uL — ABNORMAL HIGH (ref 4.0–10.5)
nRBC: 0 % (ref 0.0–0.2)

## 2019-11-20 LAB — GLUCOSE, CAPILLARY
Glucose-Capillary: 240 mg/dL — ABNORMAL HIGH (ref 70–99)
Glucose-Capillary: 284 mg/dL — ABNORMAL HIGH (ref 70–99)
Glucose-Capillary: 269 mg/dL — ABNORMAL HIGH (ref 70–99)
Glucose-Capillary: 225 mg/dL — ABNORMAL HIGH (ref 70–99)

## 2019-11-20 LAB — OCCULT BLOOD X 1 CARD TO LAB, STOOL: Fecal Occult Bld: NEGATIVE

## 2019-11-20 LAB — PROCALCITONIN: Procalcitonin: 0.25 ng/mL

## 2019-11-20 MED ORDER — SODIUM CHLORIDE 0.9 % IV SOLN
1.0000 g | INTRAVENOUS | Status: DC
  Administered 2019-11-20 – 2019-11-21 (×2): 1 g via INTRAVENOUS
  Filled 2019-11-20 (×2): qty 10

## 2019-11-20 MED ORDER — INSULIN GLARGINE 100 UNIT/ML ~~LOC~~ SOLN
10.0000 [IU] | Freq: Every day | SUBCUTANEOUS | Status: DC
  Administered 2019-11-20 – 2019-11-25 (×6): 10 [IU] via SUBCUTANEOUS
  Filled 2019-11-20 (×7): qty 0.1

## 2019-11-20 MED ORDER — IOHEXOL 9 MG/ML PO SOLN
500.0000 mL | ORAL | Status: AC
  Administered 2019-11-20: 500 mL via ORAL

## 2019-11-20 NOTE — Progress Notes (Signed)
Physical Therapy Treatment Patient Details Name: Anthony Dunlap MRN: [id number] DOB: 1933-09-21 Today's Date: 11/20/2019    History of Present Illness Pt is an 86 y.o. male admitted from SNF on 11/05/19 with sepsis, nonhealing L foot ulcer, afib with RVR. 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  Post L BKA 9/22.    PT Comments    Pt restless upon arrival to room, agreeable to PT session. Pt requires mod-max assist for bed mobility and lateral scooting at EOB, pt struggling to maintain seated balance during any dynamic tasks. PT instructed pt in LE exercises for bilat BKAs, pt with difficulty following cues for exercises at times. PT recommending SNF post-acutely, will continue to follow.     Follow Up Recommendations  SNF (Insurance declined CIR)     Equipment Recommendations  Other (comment) (TBD at next venue)    Recommendations for Other Services       Precautions / Restrictions Precautions Precautions: Fall Precaution Comments: R BKA, s/p 9/22 L BKA Restrictions Weight Bearing Restrictions: Yes RLE Weight Bearing: Non weight bearing LLE Weight Bearing: Non weight bearing    Mobility  Bed Mobility Overal bed mobility: Needs Assistance Bed Mobility: Supine to Sit;Sit to Supine     Supine to sit: Mod assist Sit to supine: Mod assist   General bed mobility comments: mod assist for trunk and LE management, scooting to and from EOB. Increased time and effort, LLE uncoordinated when moving to and from EOB.  Transfers Overall transfer level: Needs assistance Equipment used: 1 person hand held assist Transfers: Lateral/Scoot Transfers          Lateral/Scoot Transfers: Max assist General transfer comment: Max assist for x3 scooting EOB towards R/HOB, assist for leaning trunk anteriorly to offload, translating hips to R.  Ambulation/Gait              General Gait Details: unable   Stairs             Wheelchair Mobility    Modified Rankin (Stroke Patients Only)       Balance Overall balance assessment: Needs assistance Sitting-balance support: Feet unsupported;Bilateral upper extremity supported Sitting balance-Leahy Scale: Poor Sitting balance - Comments: brief periods of sitting min guard EOB with bilateral UE support, min assist to correct posterior and L lateral leaning. EOB sitting x5 minutes Postural control: Posterior lean;Left lateral lean     Standing balance comment: unable                            Cognition Arousal/Alertness: Awake/alert Behavior During Therapy: WFL for tasks assessed/performed Overall Cognitive Status: Impaired/Different from baseline Area of Impairment: Attention;Following commands;Problem solving;Awareness;Safety/judgement;Orientation                 Orientation Level: Disoriented to;Place;Time;Situation Current Attention Level: Selective   Following Commands: Follows multi-step commands inconsistently;Follows one step commands with increased time Safety/Judgement: Decreased awareness of safety;Decreased awareness of deficits Awareness: Emergent Problem Solving: Difficulty sequencing;Requires verbal cues;Requires tactile cues;Decreased initiation General Comments: Pt restless upon PT arrival, follows one-step commands most consistently with multimodal cues. Pt not oriented to location, why he is here (states "my hands hurt"), and time.      Exercises Amputee Exercises Gluteal Sets: AROM;Both;10 reps;Supine Hip Extension: AAROM;Both;10 reps;Supine (requires PT cuing on posterior aspect of pt LE) Hip ABduction/ADduction: AAROM;Both;10 reps;Supine Knee Extension: AROM;Both;10 reps;Seated  Straight Leg Raises: AROM;Both;10 reps;Supine    General Comments        Pertinent Vitals/Pain Pain Assessment: Faces Faces Pain Scale: Hurts a little bit Pain  Location: L residual limb, with movement Pain Descriptors / Indicators: Discomfort;Grimacing Pain Intervention(s): Limited activity within patient's tolerance;Monitored during session;Repositioned    Home Living                      Prior Function            PT Goals (current goals can now be found in the care plan section) Acute Rehab PT Goals Patient Stated Goal: none stated today PT Goal Formulation: With patient Time For Goal Achievement: 11/26/19 Potential to Achieve Goals: Fair Progress towards PT goals: Progressing toward goals    Frequency    Min 3X/week      PT Plan Current plan remains appropriate    Co-evaluation              AM-PAC PT "6 Clicks" Mobility   Outcome Measure  Help needed turning from your back to your side while in a flat bed without using bedrails?: A Lot Help needed moving from lying on your back to sitting on the side of a flat bed without using bedrails?: A Lot Help needed moving to and from a bed to a chair (including a wheelchair)?: Total Help needed standing up from a chair using your arms (e.g., wheelchair or bedside chair)?: Total Help needed to walk in hospital room?: Total Help needed climbing 3-5 steps with a railing? : Total 6 Click Score: 8    End of Session   Activity Tolerance: Patient tolerated treatment well;Patient limited by fatigue Patient left: in bed;with call bell/phone within reach;with bed alarm set;with nursing/sitter in room Nurse Communication: Mobility status PT Visit Diagnosis: Other abnormalities of gait and mobility (R26.89);Muscle weakness (generalized) (M62.81);Difficulty in walking, not elsewhere classified (R26.2)     Time: 1308-6578 PT Time Calculation (min) (ACUTE ONLY): 16 min  Charges:  $Therapeutic Exercise: 8-22 mins                     Nita Whitmire E, PT Acute Rehabilitation Services Pager [phone]  Office [phone]    Esvin Hnat D Anthony Dunlap 11/20/2019, 5:17 PM

## 2019-11-20 NOTE — Progress Notes (Signed)
Patient producing bloody urine, MD [place] paged. Orders to hold AM plavix and eliquis.

## 2019-11-20 NOTE — TOC Progression Note (Signed)
Transition of Care [facility]) - Progression Note    Patient Details  Name: Anthony Dunlap MRN: [id number] Date of Birth: [birth year]  Transition of Care [facility]) CM/SW Contact  Graves-Bigelow, Ocie Cornfield, RN Phone Number: 11/20/2019, 6:18 PM  Clinical Narrative:   Case Manager spoke with daughter [name] and she wants patient to return home with home health services. Referral was provided to [facility] last week. Call will need to be placed to [facility]- before transition home to make sure he is on the list. Patient will need home health orders for RN, PT, OT, Aide and F2F. Daughter states patient has DME Hospital bed, wheelchair, bedside commode and RW in the home. No equipment needed at this time. Patient will need PTAR transportation home once stable.   Expected Discharge Plan: [place] Barriers to Discharge: Continued Medical Work up  Expected Discharge Plan and Services Expected Discharge Plan: [place] In-house Referral: NA Discharge Planning Services: CM Consult Post Acute Care Choice: [place] arrangements for the past 2 months: Single Family Home Expected Discharge Date: 11/13/19               DME Arranged: N/A DME Agency: NA       HH Arranged: RN, PT, Nurse's Aide, Disease Management, OT [place] Agency: [facility] (formerly [employer]) Date [place]: 11/09/19 Time [place]: 59 Representative spoke with at [place]: [place] ([place]) Interventions    Readmission Risk Interventions Readmission Risk Prevention Plan 11/13/2019 09/29/2019 05/09/2018  Transportation Screening Complete Complete Complete  PCP or Specialist Appt within 5-7 Days - Not Complete -  Not Complete comments - plan for SNF -  PCP or Specialist Appt within 3-5 Days Complete - Complete  Home Care Screening - Complete -  Medication Review (RN CM) - Referral to [place] or [place] Complete - Complete  Social Work Consult for [place] Planning/Counseling Complete - Complete  Palliative Care Screening Not Applicable - Not Applicable  Medication Review Press photographer) Complete - Complete  Some recent data might be hidden

## 2019-11-20 NOTE — Progress Notes (Signed)
Noticed that patient was producing bloody urine in his foley line- I paged [facility] on call (B. Chotiner) with this information. He is placing orders to have CBC drawn.

## 2019-11-21 LAB — CBC
HCT: 25.2 % — ABNORMAL LOW (ref 39.0–52.0)
Hemoglobin: 7.5 g/dL — ABNORMAL LOW (ref 13.0–17.0)
MCH: 24.3 pg — ABNORMAL LOW (ref 26.0–34.0)
MCHC: 29.8 g/dL — ABNORMAL LOW (ref 30.0–36.0)
MCV: 81.6 fL (ref 80.0–100.0)
Platelets: 563 10*3/uL — ABNORMAL HIGH (ref 150–400)
RBC: 3.09 MIL/uL — ABNORMAL LOW (ref 4.22–5.81)
RDW: 16 % — ABNORMAL HIGH (ref 11.5–15.5)
WBC: 13.6 10*3/uL — ABNORMAL HIGH (ref 4.0–10.5)
nRBC: 0 % (ref 0.0–0.2)

## 2019-11-21 LAB — BASIC METABOLIC PANEL
Anion gap: 12 (ref 5–15)
BUN: 15 mg/dL (ref 8–23)
CO2: 22 mmol/L (ref 22–32)
Calcium: 8.1 mg/dL — ABNORMAL LOW (ref 8.9–10.3)
Chloride: 96 mmol/L — ABNORMAL LOW (ref 98–111)
Creatinine, Ser: 0.95 mg/dL (ref 0.61–1.24)
GFR calc Af Amer: >60 mL/min (ref >60)
GFR calc non Af Amer: >60 mL/min (ref >60)
Glucose, Bld: 213 mg/dL — ABNORMAL HIGH (ref 70–99)
Potassium: 3.6 mmol/L (ref 3.5–5.1)
Sodium: 130 mmol/L — ABNORMAL LOW (ref 135–145)

## 2019-11-21 LAB — HEMOGLOBIN AND HEMATOCRIT, BLOOD
HCT: 26 % — ABNORMAL LOW (ref 39.0–52.0)
HCT: 27 % — ABNORMAL LOW (ref 39.0–52.0)
Hemoglobin: 8.2 g/dL — ABNORMAL LOW (ref 13.0–17.0)
Hemoglobin: 8.1 g/dL — ABNORMAL LOW (ref 13.0–17.0)

## 2019-11-21 LAB — GLUCOSE, CAPILLARY
Glucose-Capillary: 198 mg/dL — ABNORMAL HIGH (ref 70–99)
Glucose-Capillary: 183 mg/dL — ABNORMAL HIGH (ref 70–99)
Glucose-Capillary: 154 mg/dL — ABNORMAL HIGH (ref 70–99)
Glucose-Capillary: 147 mg/dL — ABNORMAL HIGH (ref 70–99)

## 2019-11-21 MED ORDER — COLCHICINE 0.6 MG PO TABS
0.6000 mg | ORAL_TABLET | Freq: Every day | ORAL | Status: DC
  Administered 2019-11-21 – 2019-11-26 (×6): 0.6 mg via ORAL
  Filled 2019-11-21 (×6): qty 1

## 2019-11-21 MED ORDER — SODIUM CHLORIDE 0.9 % IV SOLN
2.0000 g | Freq: Two times a day (BID) | INTRAVENOUS | Status: DC
  Administered 2019-11-21 – 2019-11-26 (×11): 2 g via INTRAVENOUS
  Filled 2019-11-21 (×11): qty 2

## 2019-11-21 NOTE — Progress Notes (Signed)
Report given to [facility] [place]. Patient is alert and oriented x2-3 resting in bed with call light in reach. Confusion noted overnight and restlessness. Foley in place and urine output adequate. SR on tele. ACHS. IV antibiotics in place. Patient refuses SNF so plan is to DC with home health/PT/OT/aide with daughter on 10/4. Patient to resume anticoagulants per MD today. Urine has cleared and no gross hematuria noted overnight.  Hgb 7.5 (down 1.0g/dL), K+ 3.6, Creat 0.95.

## 2019-11-21 NOTE — Progress Notes (Signed)
Patient ID: GABRIELL CASIMIR, male   DOB: 07-Jun-1933, 86 y.o.   MRN: [id number]  PROGRESS NOTE    DARON STUTZ  ZLD:[id number] DOB: 08-Oct-1933 DOA: 11/05/2019 PCP: [name], MD    Brief Narrative:  86 year old with DM2,prostate cancer, severe PVD followed by [name] status post right BKA and history of hip osteomyelitis on chronic suppressive antibiotic therapy was admitted from SNFfor fever and new onset A. fib with RVR.On admission patient was hemodynamically stable but his hemoglobin was noted to be markedly decreased from last month. History per patient's daughter who is his caregiver noted that he had been having black stool for a week prior to that. Patient was not started on anticoagulation anticoagulation for his atrial fibrillation. Patient was transfused 1 unit PRBC.Patient was seen by cardiology after being placed on a Cardizem drip. Cardizem was discontinued and patient was placed on Lopressor with reasonable control of his heart rate. Patient was seen by [name] who also follows him as an outpatient. He underwent an MRI which revealed osteomyelitis of his left heel. Skin surgery was called to consult regarding blood flow. Patient underwent EGD on 11/09/2019 which was unrevealing. Vascular has advised amputation for left heel wound/osteo and he agreed for surgery on 9/22 And underwent left BKA Postop did well.  Antibiotics were discontinued as per ID recommendation.  Pain is stable.  H/h at 8.0 gm Seen by PT OT, insurance denied inpatient rehab. Patient spiked a fever on 10/1, is growing Pseudomonas in his urine. Hands appear to have gouty arthritis flare.   Assessment & Plan:   Principal Problem:   Acute osteomyelitis of left calcaneus ([name]) Active Problems:   HLD (hyperlipidemia)   Chronic anemia   Hypothyroidism   Pressure ulcer   Sepsis ([name])   Atrial fibrillation with RVR ([name])  Fever episodes: Also mild leukocytosis.  Likely UTI in the setting of Foley  catheter and grossly abnormal UA, urine culture sent.  Chest x-ray no pneumonia, procalcitonin 0.18> 0.25, blood cultures were ordered 9/30.  Given again episode of fever this morning started on ceftriaxone.  Monitor fever curve, follow-up culture data.  Growing Pseudomonas in his urine.  Has indwelling Foley. Stump site not felt to be the source of fever by vascular as no drainage, dose have mild erythema as expected around staples.  Gross hematuria urology was notified this morning.Holding Plavix and Eliquis today-reassess in the morning.  His hemoglobin dropped to 7.5 this morning so we have continue to hold his Plavix and Eliquis.  He has a chronic Foley in place underwent cystoscopy with upsizing of the Foley due to continuous leakage last week by [name].  Dr. Claudia Desanctis was consulted again today.  Dr. Was requesting suprapubic catheter but at this time urology had advised to follow-up outpatient.  Left heel pressure ulcer with underlying calcaneal osteomyelitis JQZ:ESPQ by ID and [name] was on Dapto/Unasyn. 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  Continue PT OT, SNF placement.  Vascular surgery following closely has mild redness around the staple site, apparently normal and no infection suspected.  Plan for follow up in 3 to 4 weeks for staple removal.    Sepsis POA 2/2 #1:as evident by fever, tachypnea, tachycardia and lactic acidosis. Hemodynamically stable.  A. fib with RVR: In sinus rhythm continue Eliquis and Plavix as per cardiology.Continue Cardizem IR changed from CD has difficulty with swallowing.  Continue Toprol.    Anemia of chronic disease  with reported melena S/P EGD 9/20 was normal.  Hemoglobin overall is stable.  FOBT is negative.  Pseudomonas UTI Antibiotics changed to cefepime this morning.  Await sensitivities tomorrow.  Gout flare Has colchicine 0.6 daily as needed, will change to daily to get this under  control. Pericardial effusion, small:no Further work-up as per cardiology.  Hypokalemia/hypomagnesemia: Resolved.  PVD/Lt BKA, and Right BKA: Resume Plavix tomorrow if no hematuria and okay w/ urology  DM2:uncontrolled hba1c at 8.5. no more hypoglycemia.  Blood sugar running on higher side, will increase Lantus to 10 units and continue on ssi.  Anxiety/Depression: Mood is stable continue home amitriptyline.  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  Hx of prostate cancer/XRT therapy/Chronic foley-he reports it has been there for 1 and half years.s/p cystoscopy and upsizing by urology 9/22. Having gross hematuria- urology Dr Claudia Desanctis notified.see above.  cont ditropan.  DVT prophylaxis: SCD/Compression stockings and BD:ZHGDJME Code Status: Full code  Family Communication: Patient and his daughter by phone. Disposition Plan: Family strongly desires to not have him go to a SNF.  He was previously denied inpatient rehab.  The family has arranged for ongoing support at home.   Consultants:   Urology  Cardiology  Vascular surgery  Infectious disease  Procedures:  Left BKA  2D echo  EGD  Antimicrobials: Anti-infectives (From admission, onward)   Start     Dose/Rate Route Frequency Ordered Stop   11/21/19 1430  ceFEPIme (MAXIPIME) 2 g in sodium chloride 0.9 % 100 mL IVPB        2 g 200 mL/hr over 30 Minutes Intravenous Every 12 hours 11/21/19 1420     11/20/19 1230  cefTRIAXone (ROCEPHIN) 1 g in sodium chloride 0.9 % 100 mL IVPB        1 g 200 mL/hr over 30 Minutes Intravenous Every 24 hours 11/20/19 1228 11/25/19 1229   11/11/19 1500  ceFAZolin (ANCEF) IVPB 2g/100 mL premix  Status:  Discontinued        2 g 200 mL/hr over 30 Minutes Intravenous Every 8 hours 11/11/19 1446 11/11/19 1513   11/07/19 1200  DAPTOmycin (CUBICIN) 700 mg in sodium chloride 0.9  % IVPB  Status:  Discontinued        700 mg 228 mL/hr over 30 Minutes Intravenous Daily 11/07/19 1056 11/12/19 1141   11/07/19 1200  Ampicillin-Sulbactam (UNASYN) 3 g in sodium chloride 0.9 % 100 mL IVPB  Status:  Discontinued        3 g 200 mL/hr over 30 Minutes Intravenous Every 6 hours 11/07/19 1056 11/12/19 1141   11/06/19 1300  vancomycin (VANCOREADY) IVPB 1250 mg/250 mL  Status:  Discontinued        1,250 mg 166.7 mL/hr over 90 Minutes Intravenous Every 24 hours 11/05/19 1324 11/07/19 1030   11/06/19 0000  ceFEPIme (MAXIPIME) 2 g in sodium chloride 0.9 % 100 mL IVPB  Status:  Discontinued        2 g 200 mL/hr over 30 Minutes Intravenous Every 12 hours 11/05/19 1648 11/07/19 1030   11/05/19 2200  metroNIDAZOLE (FLAGYL) IVPB 500 mg  Status:  Discontinued        500 mg 100 mL/hr over 60 Minutes Intravenous Every 8 hours 11/05/19 1637 11/07/19 1030   11/05/19 1300  vancomycin (VANCOREADY) IVPB 2000 mg/400 mL        2,000 mg 200 mL/hr over 120 Minutes Intravenous  Once 11/05/19 1226 11/05/19 1453   11/05/19 1230  metroNIDAZOLE (FLAGYL) IVPB 500 mg        500 mg 100 mL/hr over 60 Minutes Intravenous  Once 11/05/19 1223 11/05/19 1345   11/05/19 1215  ceFEPIme (MAXIPIME) 2 g in sodium chloride 0.9 % 100 mL IVPB        2 g 200 mL/hr over 30 Minutes Intravenous  Once 11/05/19 1201 11/05/19 1254       Subjective: Patient is dysarthric and hard to understand this morning.  Discussed with his daughter this is a change in his usual level of consciousness.  However it is improved over the last few days.  He lost his wife approximately 1 month ago.  Objective: Vitals:   11/21/19 0100 11/21/19 0500 11/21/19 0750 11/21/19 0755  BP:  137/74 (!) 141/78 (!) 141/78  Pulse:  97 (!) 102 (!) 103  Resp:  19  18  Temp:  98.7 F (37.1 C)  98.8 F (37.1 C)  TempSrc:  Oral  Oral  SpO2: 97% 95% 95% 95%  Weight:      Height:        Intake/Output Summary (Last 24 hours) at 11/21/2019 1420 Last  data filed at 11/21/2019 0901 Gross per 24 hour  Intake 460 ml  Output 850 ml  Net -390 ml   Filed Weights   11/18/19 0508 11/19/19 0630 11/20/19 0532  Weight: 70.2 kg 67.9 kg 70.4 kg    Examination:  General exam: Appears calm and comfortable  Respiratory system: Clear to auscultation. Respiratory effort normal. Cardiovascular system: S1 & S2 heard, irregularly irregular Gastrointestinal system: Abdomen is nondistended, soft and nontender.  Central nervous system: Alert and oriented. No focal neurological deficits. Extremities: Bilateral BKA, mild erythema around staple line on left.  Left hand with multiple inflamed MCP joints with erythema associated right hand the third finger with similar MCP joint inflammation and redness. Psychiatry: Judgement and insight appear normal. Mood & affect appropriate.     Data Reviewed: I have personally reviewed following labs and imaging studies  CBC: Recent Labs  Lab 11/17/19 0143 11/17/19 0143 11/19/19 1822 11/19/19 1822 11/20/19 0348 11/20/19 1217 11/20/19 1839 11/21/19 0247 11/21/19 1035  WBC 10.5  --  11.5*  --  13.5*  --   --  13.6*  --   HGB 8.4*   < > 8.7*   < > 8.2* 8.3* 8.5* 7.5* 8.2*  HCT 27.9*   < > 28.9*   < > 27.0* 27.4* 28.2* 25.2* 26.0*  MCV 81.8  --  81.0  --  80.8  --   --  81.6  --   PLT 700*  --  624*  --  625*  --   --  563*  --    < > = values in this interval not displayed.   Basic Metabolic Panel: Recent Labs  Lab 11/17/19 0143 11/19/19 1822 11/21/19 0247  NA 134* 131* 130*  K 3.5 3.8 3.6  CL 100 97* 96*  CO2 24 22 22   GLUCOSE 187* 279* 213*  BUN 9 17 15   CREATININE 0.87 1.15 0.95  CALCIUM 8.3* 8.3* 8.1*   GFR: Estimated Creatinine Clearance: 56 mL/min (by C-G formula based on SCr of 0.95 mg/dL). Liver Function Tests: Recent Labs  Lab 11/19/19 1822  AST 52*  ALT 28  ALKPHOS 145*  BILITOT 0.4  PROT 6.7  ALBUMIN 2.2*   CBG: Recent Labs  Lab 11/20/19 1221 11/20/19 1646 11/20/19 2045  11/21/19  0754 11/21/19 1131  GLUCAP 284* 269* 225* 198* 183*   Sepsis Labs: Recent Labs  Lab 11/19/19 1822 11/20/19 0348  PROCALCITON 0.18 0.25    Recent Results (from the past 240 hour(s))  Culture, blood (routine x 2)     Status: None (Preliminary result)   Collection Time: 11/19/19  5:58 PM   Specimen: BLOOD LEFT HAND  Result Value Ref Range Status   Specimen Description BLOOD LEFT HAND  Final   Special Requests   Final    BOTTLES DRAWN AEROBIC AND ANAEROBIC Blood Culture adequate volume   Culture   Final    NO GROWTH 2 DAYS Performed at [facility], [place] [address], [place], [place] [address]    Report Status PENDING  Incomplete  Culture, blood (routine x 2)     Status: None (Preliminary result)   Collection Time: 11/19/19  5:58 PM   Specimen: BLOOD RIGHT HAND  Result Value Ref Range Status   Specimen Description BLOOD RIGHT HAND  Final   Special Requests   Final    BOTTLES DRAWN AEROBIC AND ANAEROBIC Blood Culture adequate volume   Culture   Final    NO GROWTH 2 DAYS Performed at [facility], [place] [address], [place],  [place] [address]    Report Status PENDING  Incomplete  Culture, Urine     Status: Abnormal (Preliminary result)   Collection Time: 11/20/19 10:07 AM   Specimen: Urine, Catheterized  Result Value Ref Range Status   Specimen Description URINE, CATHETERIZED  Final   Special Requests   Final    NONE Performed at [facility], [place] [address] [place], [place] [address]    Culture (A)  Final    >=100,000 COLONIES/mL PSEUDOMONAS AERUGINOSA SUSCEPTIBILITIES TO FOLLOW YEAST    Report Status PENDING  Incomplete      Radiology Studies: CT ABDOMEN PELVIS WO CONTRAST  Result Date: 11/20/2019 CLINICAL DATA:  Abdominal pain and distension. EXAM: CT ABDOMEN AND PELVIS WITHOUT CONTRAST TECHNIQUE: Multidetector CT imaging of the abdomen and pelvis was performed following the standard protocol without IV contrast. COMPARISON:  CT 2 weeks  ago 11/05/2019, pelvis MRI 01/25/2019 FINDINGS: Lower chest: Improved right but slightly worsening left pleural effusion. Adjacent atelectasis. High density pericardial fluid which has slightly diminished in volume from prior exam. There are coronary artery calcifications. Motion obscures detailed assessment. Hepatobiliary: No obvious focal liver abnormality, partially obscured by motion. Motion through the gallbladder without calcified gallstone. There is no biliary dilatation. Pancreas: Parenchymal atrophy. No ductal dilatation or inflammation. Spleen: Motion limited, no gross acute abnormality. Adrenals/Urinary Tract: Mild adrenal thickening without dominant nodule. Motion through the kidneys. There is no hydronephrosis. Mild bilateral renal parenchymal atrophy. Suggestion of mild symmetric perinephric edema, motion obscured. No visualized renal calculi. Urinary bladder is decompressed by catheter. Stomach/Bowel: Similar appearance of gaseous distension and redundancy of the sigmoid colon. Large stool burden in the ascending, transverse, and proximal descending colon. Mixed liquid and solid stool within the rectum. No obvious rectal wall thickening. Administered enteric contrast reaches the distal small bowel. Inguinal canals are patulous with small bowel approaching but not entering. No small bowel obstruction. Partially distended stomach, primarily decompressed. Vascular/Lymphatic: Aorto bi-iliac atherosclerosis. Aortic branch atherosclerosis. Small left inguinal nodes largest measuring 9 mm short axis, unchanged. Reproductive: Brachytherapy seeds in the prostate gland. Other: No free air or ascites/free fluid. Small fat containing umbilical hernia. 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  Right hip arthroplasty. There is a small left hip joint effusion. Left hip osteoarthritis. Degenerative change in the lumbar spine with primarily facet  hypertrophy. Enthesopathic change involving the hamstring insertions. No acute osseous abnormalities. IMPRESSION: 1. No acute abnormality in the abdomen/pelvis. 2. Large stool burden with gaseous distension and redundancy of the sigmoid colon, similar to prior exam. Mixed liquid and solid stool within the rectum without obvious rectal wall thickening. Overall findings consistent with constipation. There is no bowel obstruction. 3. Improved right but slightly worsening small left pleural effusion. High density pericardial fluid has slightly diminished in volume from prior exam. 4. Diminished right inguinal hernia, inguinal canal appears patulous on the current exam. No bowel involvement currently. 5. Chronic osteomyelitis of the pubic symphysis. Aortic Atherosclerosis (ICD10-I70.0). Electronically Signed   By: Keith Rake M.D.   On: 11/20/2019 23:40   DG Chest Port 1 View  Result Date: 11/19/2019 CLINICAL DATA:  Fever. EXAM: PORTABLE CHEST 1 VIEW COMPARISON:  11/05/2019 FINDINGS: Borderline cardiomegaly. Unchanged mediastinal contours. Mild chronic elevation of left hemidiaphragm with adjacent atelectasis. Confluent consolidation. No pulmonary edema. No pneumothorax. No significant pleural effusion. Stable osseous structures with chronic degenerative change of the left shoulder. IMPRESSION: 1. No evidence of pneumonia.  Borderline cardiomegaly. 2. Chronic elevation of left hemidiaphragm with adjacent atelectasis. Electronically Signed   By: Keith Rake M.D.   On: 11/19/2019 17:53     Scheduled Meds: . sodium chloride   Intravenous Once  . sodium chloride   Intravenous Once  . amitriptyline  20 mg Oral QHS  . apixaban  5 mg Oral BID  . Chlorhexidine Gluconate Cloth  6 each Topical Daily  . clopidogrel  75 mg Oral Daily  . colchicine  0.6 mg Oral Daily  . diltiazem  60 mg Oral Q6H  . docusate  100 mg Oral Daily  . ferrous sulfate  325 mg Oral Q breakfast  . insulin aspart  0-5 Units  Subcutaneous QHS  . insulin aspart  0-9 Units Subcutaneous TID WC  . insulin glargine  10 Units Subcutaneous QHS  . levothyroxine  100 mcg Oral Q breakfast  . metoprolol succinate  50 mg Oral Daily  . multivitamin with minerals  1 tablet Oral Daily  . oxybutynin  5 mg Oral QHS  . pantoprazole  40 mg Oral QHS  . polyethylene glycol  17 g Oral Daily  . pravastatin  10 mg Oral Q supper  . tobramycin  2 drop Both Eyes Q4H while awake   Continuous Infusions: . sodium chloride Stopped (11/20/19 1949)  . ceFEPime (MAXIPIME) IV    . cefTRIAXone (ROCEPHIN)  IV 1 g (11/21/19 1217)  . magnesium sulfate bolus IVPB       LOS: 16 days    Donnamae Jude, MD 11/21/2019 2:20 PM [phone] [facility] If 7PM-7AM, please contact night-coverage 11/21/2019, 2:20 PM

## 2019-11-22 LAB — GLUCOSE, CAPILLARY
Glucose-Capillary: 170 mg/dL — ABNORMAL HIGH (ref 70–99)
Glucose-Capillary: 225 mg/dL — ABNORMAL HIGH (ref 70–99)
Glucose-Capillary: 232 mg/dL — ABNORMAL HIGH (ref 70–99)
Glucose-Capillary: 267 mg/dL — ABNORMAL HIGH (ref 70–99)

## 2019-11-22 LAB — HEMOGLOBIN AND HEMATOCRIT, BLOOD
HCT: 25.8 % — ABNORMAL LOW (ref 39.0–52.0)
Hemoglobin: 7.8 g/dL — ABNORMAL LOW (ref 13.0–17.0)

## 2019-11-22 NOTE — Progress Notes (Signed)
Patient ID: Anthony Dunlap, male   DOB: 1934/01/05, 86 y.o.   MRN: [id number]  PROGRESS NOTE    Anthony Dunlap  ESP:[id number] DOB: [birth year] DOA: 11/05/2019 PCP: [name], MD    Brief Narrative:  86 year old with DM2,prostate cancer, severe PVD followed by [name] status post right BKA and history of hip osteomyelitis on chronic suppressive antibiotic therapy was [facility] from SNFfor fever and new onset A. fib with RVR.On admission patient was hemodynamically stable but his hemoglobin was noted to be markedly decreased from last month. History per patient's daughter who is his caregiver noted that he had been having black stool for a week prior to that. Patient was not started on anticoagulation anticoagulation for his atrial fibrillation. Patient was transfused 1 unit PRBC.Patient was seen by cardiology after being placed on a Cardizem drip. Cardizem was discontinued and patient was placed on Lopressor with reasonable control of his heart rate. Patient was seen by [name] who also follows him as an outpatient. He underwent an MRI which revealed osteomyelitis of his left heel. Skin surgery was called to consult regarding blood flow. Patient underwent EGD on 11/09/2019 which was unrevealing. Vascular has advised amputation for left heel wound/osteo and he agreed for surgery on 9/22 And underwent left BKA Postop did well. Antibiotics were discontinued as per ID recommendation. Pain is stable. H/h at 8.0 gm Seen by PT OT, insurance denied inpatient rehab. Patient spiked a fever on 10/1, is growing Pseudomonas in his urine. Hands appear to have gouty arthritis flare.   Assessment & Plan:   Principal Problem:   Acute osteomyelitis of left calcaneus ([name]) Active Problems:   HLD (hyperlipidemia)   Chronic anemia   Hypothyroidism   Pressure ulcer   Sepsis ([place])   Atrial fibrillation with RVR ([name])    Gross hematuriaurology was notified this morning.Holding Plavix and Eliquis  today-reassess in the morning.  His hemoglobin dropped to 7.5 this morning so we have continue to hold his Plavix and Eliquis.He has a chronic Foley in place underwent cystoscopy with upsizing of the Foley due to continuous leakage last week by [name]. Dr. Claudia Desanctis was consulted again today. Dr. Was requesting suprapubic catheter but at this time urology hadadvised to follow-up outpatient.  Left heel pressure ulcer with underlying calcaneal osteomyelitisPOA:Seen by ID and [name] was on Dapto/Unasyn. 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 Continue PT OT, SNF placement. Vascular surgery following closely has mild redness around the staple site, apparently normal and no infection suspected.Plan for follow up in3 to 4 weeks for staple removal.   Sepsis POA 2/2 #1:as evident by fever, tachypnea, tachycardia and lactic acidosis. Hemodynamically stable.  A. fib with RVR:In sinus rhythm continue Eliquis and Plavix as per cardiology.Continue Cardizem IR changed from CD has difficulty with swallowing. Continue Toprol. Back in atrial fib with rapid ventricular rate today, cardiology has added IV Lopressor for now I will round again in the morning.  Anemia of chronic disease with reported melena S/P EGD 9/20 was normal.Hemoglobin overall is stable. FOBT is negative.  Pseudomonas UTI Antibiotics changed to cefepime this morning.  Await sensitivities tomorrow.  Gout flare Has colchicine 0.6 daily as needed, will change to daily to get this under control.  Pericardial effusion, small:noFurther work-up as per cardiology.  Hypokalemia/hypomagnesemia:Resolved.  PVD/Lt BKA, and Right QJF:HLKTGY Plavix tomorrow if no hematuria and okay w/urology  DM2:uncontrolled hba1c at 8.5. no morehypoglycemia. Blood sugar running on higher  side, will increase Lantus to 10 units and continue on ssi.  Anxiety/Depression: Mood is stable  continue home amitriptyline. 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  Hx of prostate cancer/XRT therapy/Chronic foley-he reports it has been there for 1 and half years.s/p cystoscopy andupsizing by urology 9/22.Having gross hematuria- urology Dr Claudia Desanctis notified.see above. cont ditropan.   DVT prophylaxis: YT:[id number] Code Status: Full code  Family Communication: Patient at bedside Disposition Plan: Family strongly desires to not have him medicine for this is recommended. His insurance has denied inpatient rehab. The family is arranging for ongoing home support.  Patient remains inpatient as he requires IV meds for rate control, IV meds for treatment of UTI, and unsafe discharge plan.   Consultants:   Urology  Cardiology  Vascular surgery  Infectious disease  Procedures:  Left BKA  2D echo left ventricular function at 32 to 65%, left ventricular hypertrophy, right ventricular normal function, left atrial dilation, pericardial effusion, normal mitral valve, normal aortic valve  EGD  Antimicrobials: Anti-infectives (From admission, onward)   Start     Dose/Rate Route Frequency Ordered Stop   11/21/19 1430  ceFEPIme (MAXIPIME) 2 g in sodium chloride 0.9 % 100 mL IVPB        2 g 200 mL/hr over 30 Minutes Intravenous Every 12 hours 11/21/19 1420     11/20/19 1230  cefTRIAXone (ROCEPHIN) 1 g in sodium chloride 0.9 % 100 mL IVPB  Status:  Discontinued        1 g 200 mL/hr over 30 Minutes Intravenous Every 24 hours 11/20/19 1228 11/21/19 1426   11/11/19 1500  ceFAZolin (ANCEF) IVPB 2g/100 mL premix  Status:  Discontinued        2 g 200 mL/hr over 30 Minutes Intravenous Every 8 hours 11/11/19 1446 11/11/19 1513   11/07/19 1200  DAPTOmycin (CUBICIN) 700 mg in sodium chloride 0.9 % IVPB  Status:  Discontinued        700 mg 228 mL/hr over 30 Minutes  Intravenous Daily 11/07/19 1056 11/12/19 1141   11/07/19 1200  Ampicillin-Sulbactam (UNASYN) 3 g in sodium chloride 0.9 % 100 mL IVPB  Status:  Discontinued        3 g 200 mL/hr over 30 Minutes Intravenous Every 6 hours 11/07/19 1056 11/12/19 1141   11/06/19 1300  vancomycin (VANCOREADY) IVPB 1250 mg/250 mL  Status:  Discontinued        1,250 mg 166.7 mL/hr over 90 Minutes Intravenous Every 24 hours 11/05/19 1324 11/07/19 1030   11/06/19 0000  ceFEPIme (MAXIPIME) 2 g in sodium chloride 0.9 % 100 mL IVPB  Status:  Discontinued        2 g 200 mL/hr over 30 Minutes Intravenous Every 12 hours 11/05/19 1648 11/07/19 1030   11/05/19 2200  metroNIDAZOLE (FLAGYL) IVPB 500 mg  Status:  Discontinued        500 mg 100 mL/hr over 60 Minutes Intravenous Every 8 hours 11/05/19 1637 11/07/19 1030   11/05/19 1300  vancomycin (VANCOREADY) IVPB 2000 mg/400 mL        2,000 mg 200 mL/hr over 120 Minutes Intravenous  Once 11/05/19 1226 11/05/19 1453   11/05/19 1230  metroNIDAZOLE (FLAGYL) IVPB 500 mg        500 mg 100 mL/hr over 60 Minutes Intravenous  Once 11/05/19 1223 11/05/19 1345   11/05/19 1215  ceFEPIme (MAXIPIME) 2 g in sodium chloride  0.9 % 100 mL IVPB        2 g 200 mL/hr over 30 Minutes Intravenous  Once 11/05/19 1201 11/05/19 1254       Subjective: Feels better today, the dysarthria that was present yesterday seems to be gone he is far less confused.  Objective: Vitals:   11/22/19 0937 11/22/19 1154 11/22/19 1645 11/22/19 1716  BP:  122/68 126/86 126/86  Pulse:  (!) 104 (!) 114 (!) 114  Resp:  18 18 18   Temp:  98.7 F (37.1 C) 98.8 F (37.1 C) 98.8 F (37.1 C)  TempSrc:  Oral Oral   SpO2: 95% 96% 96%   Weight:      Height:        Intake/Output Summary (Last 24 hours) at 11/22/2019 1808 Last data filed at 11/22/2019 1703 Gross per 24 hour  Intake 478.5 ml  Output 500 ml  Net -21.5 ml   Filed Weights   11/19/19 0630 11/20/19 0532 11/22/19 0332  Weight: 67.9 kg 70.4 kg 72.4 kg     Examination:  General exam: Appears calm and comfortable  Respiratory system: Clear to auscultation. Respiratory effort normal. Cardiovascular system: S1 & S2 heard, RRR.  Gastrointestinal system: Abdomen is nondistended, soft and nontender.  Central nervous system: Alert and oriented. No focal neurological deficits. Extremities: Bilateral AKA's, mild erythema at staple line on the left Skin: Improving erythema and joint swelling of his hands Psychiatry: Judgement and insight appear normal. Mood & affect appropriate.     Data Reviewed: I have personally reviewed following labs and imaging studies  CBC: Recent Labs  Lab 11/17/19 0143 11/17/19 0143 11/19/19 1822 11/19/19 1822 11/20/19 0348 11/20/19 1217 11/20/19 1839 11/21/19 0247 11/21/19 1035 11/21/19 1831 11/22/19 0247  WBC 10.5  --  11.5*  --  13.5*  --   --  13.6*  --   --   --   HGB 8.4*   < > 8.7*   < > 8.2*   < > 8.5* 7.5* 8.2* 8.1* 7.8*  HCT 27.9*   < > 28.9*   < > 27.0*   < > 28.2* 25.2* 26.0* 27.0* 25.8*  MCV 81.8  --  81.0  --  80.8  --   --  81.6  --   --   --   PLT 700*  --  624*  --  625*  --   --  563*  --   --   --    < > = values in this interval not displayed.   Basic Metabolic Panel: Recent Labs  Lab 11/17/19 0143 11/19/19 1822 11/21/19 0247  NA 134* 131* 130*  K 3.5 3.8 3.6  CL 100 97* 96*  CO2 24 22 22   GLUCOSE 187* 279* 213*  BUN 9 17 15   CREATININE 0.87 1.15 0.95  CALCIUM 8.3* 8.3* 8.1*   GFR: Estimated Creatinine Clearance: 56 mL/min (by C-G formula based on SCr of 0.95 mg/dL). Liver Function Tests: Recent Labs  Lab 11/19/19 1822  AST 52*  ALT 28  ALKPHOS 145*  BILITOT 0.4  PROT 6.7  ALBUMIN 2.2*   CBG: Recent Labs  Lab 11/21/19 1701 11/21/19 2105 11/22/19 0806 11/22/19 1153 11/22/19 1647  GLUCAP 154* 147* 170* 225* 232*   Sepsis Labs: Recent Labs  Lab 11/19/19 1822 11/20/19 0348  PROCALCITON 0.18 0.25    Recent Results (from the past 240 hour(s))   Culture, blood (routine x 2)     Status: None (Preliminary result)  Collection Time: 11/19/19  5:58 PM   Specimen: BLOOD LEFT HAND  Result Value Ref Range Status   Specimen Description BLOOD LEFT HAND  Final   Special Requests   Final    BOTTLES DRAWN AEROBIC AND ANAEROBIC Blood Culture adequate volume   Culture   Final    NO GROWTH 3 DAYS Performed at [facility], [address], [place], [place] [address]    Report Status PENDING  Incomplete  Culture, blood (routine x 2)     Status: None (Preliminary result)   Collection Time: 11/19/19  5:58 PM   Specimen: BLOOD RIGHT HAND  Result Value Ref Range Status   Specimen Description BLOOD RIGHT HAND  Final   Special Requests   Final    BOTTLES DRAWN AEROBIC AND ANAEROBIC Blood Culture adequate volume   Culture   Final    NO GROWTH 3 DAYS Performed at [facility], [place] [address], [place], [place] [address]    Report Status PENDING  Incomplete  Culture, Urine     Status: Abnormal (Preliminary result)   Collection Time: 11/20/19 10:07 AM   Specimen: Urine, Catheterized  Result Value Ref Range Status   Specimen Description URINE, CATHETERIZED  Final   Special Requests   Final    NONE Performed at [facility], [place] [address] [place], [place] [address]    Culture (A)  Final    >=100,000 COLONIES/mL PSEUDOMONAS AERUGINOSA >=100,000 COLONIES/mL YEAST CULTURE REINCUBATED FOR BETTER GROWTH PSEUDOMONAS AERUGINOSA TO BE SENT OUT FOR SENSITIVITIES    Report Status PENDING  Incomplete      Radiology Studies: CT ABDOMEN PELVIS WO CONTRAST  Result Date: 11/20/2019 CLINICAL DATA:  Abdominal pain and distension. EXAM: CT ABDOMEN AND PELVIS WITHOUT CONTRAST TECHNIQUE: Multidetector CT imaging of the abdomen and pelvis was performed following the standard protocol without IV contrast. COMPARISON:  CT 2 weeks ago 11/05/2019, pelvis MRI 01/25/2019 FINDINGS: Lower chest: Improved right but slightly worsening left pleural  effusion. Adjacent atelectasis. High density pericardial fluid which has slightly diminished in volume from prior exam. There are coronary artery calcifications. Motion obscures detailed assessment. Hepatobiliary: No obvious focal liver abnormality, partially obscured by motion. Motion through the gallbladder without calcified gallstone. There is no biliary dilatation. Pancreas: Parenchymal atrophy. No ductal dilatation or inflammation. Spleen: Motion limited, no gross acute abnormality. Adrenals/Urinary Tract: Mild adrenal thickening without dominant nodule. Motion through the kidneys. There is no hydronephrosis. Mild bilateral renal parenchymal atrophy. Suggestion of mild symmetric perinephric edema, motion obscured. No visualized renal calculi. Urinary bladder is decompressed by catheter. Stomach/Bowel: Similar appearance of gaseous distension and redundancy of the sigmoid colon. Large stool burden in the ascending, transverse, and proximal descending colon. Mixed liquid and solid stool within the rectum. No obvious rectal wall thickening. Administered enteric contrast reaches the distal small bowel. Inguinal canals are patulous with small bowel approaching but not entering. No small bowel obstruction. Partially distended stomach, primarily decompressed. Vascular/Lymphatic: Aorto bi-iliac atherosclerosis. Aortic branch atherosclerosis. Small left inguinal nodes largest measuring 9 mm short axis, unchanged. Reproductive: Brachytherapy seeds in the prostate gland. Other: No free air or ascites/free fluid. Small fat containing umbilical hernia. 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 Right hip arthroplasty. There is a small left hip joint effusion. Left hip osteoarthritis. Degenerative change in the lumbar spine with primarily facet hypertrophy. Enthesopathic change involving the hamstring insertions. No acute osseous abnormalities. IMPRESSION: 1. No  acute abnormality  in the abdomen/pelvis. 2. Large stool burden with gaseous distension and redundancy of the sigmoid colon, similar to prior exam. Mixed liquid and solid stool within the rectum without obvious rectal wall thickening. Overall findings consistent with constipation. There is no bowel obstruction. 3. Improved right but slightly worsening small left pleural effusion. High density pericardial fluid has slightly diminished in volume from prior exam. 4. Diminished right inguinal hernia, inguinal canal appears patulous on the current exam. No bowel involvement currently. 5. Chronic osteomyelitis of the pubic symphysis. Aortic Atherosclerosis (ICD10-I70.0). Electronically Signed   By: Keith Rake M.D.   On: 11/20/2019 23:40     Scheduled Meds: . sodium chloride   Intravenous Once  . sodium chloride   Intravenous Once  . amitriptyline  20 mg Oral QHS  . apixaban  5 mg Oral BID  . Chlorhexidine Gluconate Cloth  6 each Topical Daily  . clopidogrel  75 mg Oral Daily  . colchicine  0.6 mg Oral Daily  . diltiazem  60 mg Oral Q6H  . docusate  100 mg Oral Daily  . ferrous sulfate  325 mg Oral Q breakfast  . insulin aspart  0-5 Units Subcutaneous QHS  . insulin aspart  0-9 Units Subcutaneous TID WC  . insulin glargine  10 Units Subcutaneous QHS  . levothyroxine  100 mcg Oral Q breakfast  . metoprolol succinate  50 mg Oral Daily  . multivitamin with minerals  1 tablet Oral Daily  . oxybutynin  5 mg Oral QHS  . pantoprazole  40 mg Oral QHS  . polyethylene glycol  17 g Oral Daily  . pravastatin  10 mg Oral Q supper  . tobramycin  2 drop Both Eyes Q4H while awake   Continuous Infusions: . sodium chloride Stopped (11/20/19 1949)  . ceFEPime (MAXIPIME) IV 2 g (11/22/19 0936)  . magnesium sulfate bolus IVPB       LOS: 17 days    Donnamae Jude, MD 11/22/2019 6:08 PM [phone] [facility] If 7PM-7AM, please contact night-coverage 11/22/2019, 6:08 PM

## 2019-11-22 NOTE — Progress Notes (Signed)
Patient's HR spiked up to 150s, nonsustained on monitor. RN performed EKG which showed atrial fibrillation w/ RVR. On-call cardiologist Domenic Polite made aware. Patient report no distress or discomfort. No change in VS otherwise. Will assess closely

## 2019-11-22 NOTE — Progress Notes (Signed)
   Progress Note  Patient Name: Anthony Dunlap Date of Encounter: 11/22/2019  Primary Cardiologist: Carlyle Dolly, MD  Informed by nursing that patient noted to be back in atrial fibrillation with RVR.  He was being followed by cardiology with similar problem earlier in hospital stay - on Eliquis, Cardizem 60 mg p.o. every 6 hours, and Toprol-XL 50 mg daily.  He had been in sinus rhythm as of 24 September.  We will get him back on our regular rounding list to help follow his paroxysmal atrial fibrillation and make further medication adjustments.  Systolic blood pressure in the 120s.  In the short-term, could use divided dose IV Lopressor for heart rate control while other longer-term medication adjustments are made.  Signed, Rozann Lesches, MD  11/22/2019, 12:51 PM

## 2019-11-22 NOTE — Plan of Care (Signed)
  Problem: Clinical Measurements: Goal: Diagnostic test results will improve Outcome: Progressing   Problem: Nutrition: Goal: Adequate nutrition will be maintained Outcome: Progressing   Problem: Coping: Goal: Level of anxiety will decrease Outcome: Completed/Met

## 2019-11-23 LAB — CBC
HCT: 27.2 % — ABNORMAL LOW (ref 39.0–52.0)
Hemoglobin: 8.1 g/dL — ABNORMAL LOW (ref 13.0–17.0)
MCH: 24.3 pg — ABNORMAL LOW (ref 26.0–34.0)
MCHC: 29.8 g/dL — ABNORMAL LOW (ref 30.0–36.0)
MCV: 81.4 fL (ref 80.0–100.0)
Platelets: 705 10*3/uL — ABNORMAL HIGH (ref 150–400)
RBC: 3.34 MIL/uL — ABNORMAL LOW (ref 4.22–5.81)
RDW: 16.3 % — ABNORMAL HIGH (ref 11.5–15.5)
WBC: 10.3 10*3/uL (ref 4.0–10.5)
nRBC: 0 % (ref 0.0–0.2)

## 2019-11-23 LAB — GLUCOSE, CAPILLARY
Glucose-Capillary: 216 mg/dL — ABNORMAL HIGH (ref 70–99)
Glucose-Capillary: 281 mg/dL — ABNORMAL HIGH (ref 70–99)
Glucose-Capillary: 226 mg/dL — ABNORMAL HIGH (ref 70–99)
Glucose-Capillary: 155 mg/dL — ABNORMAL HIGH (ref 70–99)

## 2019-11-23 LAB — COMPREHENSIVE METABOLIC PANEL
ALT: 28 U/L (ref 0–44)
AST: 36 U/L (ref 15–41)
Albumin: 2 g/dL — ABNORMAL LOW (ref 3.5–5.0)
Alkaline Phosphatase: 147 U/L — ABNORMAL HIGH (ref 38–126)
Anion gap: 11 (ref 5–15)
BUN: 18 mg/dL (ref 8–23)
CO2: 22 mmol/L (ref 22–32)
Calcium: 8.3 mg/dL — ABNORMAL LOW (ref 8.9–10.3)
Chloride: 103 mmol/L (ref 98–111)
Creatinine, Ser: 0.97 mg/dL (ref 0.61–1.24)
GFR calc Af Amer: >60 mL/min (ref >60)
GFR calc non Af Amer: >60 mL/min (ref >60)
Glucose, Bld: 226 mg/dL — ABNORMAL HIGH (ref 70–99)
Potassium: 3.9 mmol/L (ref 3.5–5.1)
Sodium: 136 mmol/L (ref 135–145)
Total Bilirubin: 0.4 mg/dL (ref 0.3–1.2)
Total Protein: 6.5 g/dL (ref 6.5–8.1)

## 2019-11-23 MED ORDER — DILTIAZEM HCL ER COATED BEADS 180 MG PO CP24
300.0000 mg | ORAL_CAPSULE | Freq: Every day | ORAL | Status: DC
  Administered 2019-11-23 – 2019-11-26 (×4): 300 mg via ORAL
  Filled 2019-11-23 (×4): qty 1

## 2019-11-23 NOTE — Progress Notes (Signed)
PROGRESS NOTE    Anthony Dunlap  OFB:[id number] DOB: 1933-10-11 DOA: 11/05/2019 PCP: [name], MD (Confirm with patient/family/NH records and if not entered, this HAS to be entered at [facility] point of entry. "No PCP" if truly none.)   Brief Narrative: (Start on day 1 of progress note - keep it brief and live) Patient is an 86 year old male with history of peripheral arterial disease, prostate cancer, chronic anemia and hypothyroidism admitted for new onset atrial fibrillation with RVR and left heel ulcer.  Patient is s/p BKA and atrial fibrillation is also controlled Cardizem and metoprolol.  Patient also has a history of anemia of chronic disease and hemoglobin is stable at this time.   Assessment & Plan:   Principal Problem:   Acute osteomyelitis of left calcaneus ([name]) Active Problems:   HLD (hyperlipidemia)   Chronic anemia   Hypothyroidism   Pressure ulcer   Sepsis ([name])   Atrial fibrillation with RVR ([name])   Left heel pressure ulcer with underlying calcaneal osteomyelitis bilateral BKA  Patient presented with left heel pressure ulcer calcaneal osteomyelitis at the time of admission.  Patient is s/p BKA.  Patient completed a course of daptomycin and Unasyn.  No infection present at the stump site.  Staples will be removed in 3 to 4 weeks.  PT/OT recommended SNF placement.  Sepsis secondary to above present on admission, resolved See above  Atrial fibrillation with RVR  Patient is on Eliquis and Cardizem IR and he has an sinus rhythm.  IV Lopressor as needed if patient go back into A. fib with RVR.  Urinary tract infection Continue cefepime  Gross hematuria, resolved  Hematuria has completely resolved.  Eliquis and Plavix were ordered because of acute gross hematuria.  Eliquis and Plavix restarted by cardiology and hemoglobin is stable.  No more hematuria.  Diabetes mellitus type 2 Continue Lantus 10 units daily and sliding scale insulin.  Blood glucose monitoring and  hypoglycemic protocol in place  Anxiety/depression Continue amitriptyline.  No signs and symptoms of anxiety and depression.   Gout Continue colchicine as needed  Peripheral vascular disease status post bilateral BKA Continue Plavix.  History of prostate cancer  Patient has a permanent Foley catheter.  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  Supportive management   DVT prophylaxis: Continue Eliquis Code Status: Full code Family Communication: No family was present at the bedside Disposition Plan: PT recommended SNF   Consultants:   Urology  Infectious disease  Vascular surgery  Procedures: (Don't include imaging studies which can be auto populated. Include things that cannot be auto populated i.e. Echo, Carotid and venous dopplers, Foley, Bipap, HD, tubes/drains, wound vac, central lines etc)  Left BKA  Echocardiogram  EGD  Antimicrobials: (specify start and planned stop date. Auto populated tables are space occupying and do not give end dates)  Daptomycin, cefepime   Subjective: Patient is seen at the bedside this morning.  Patient denies any complaints at this time.  No acute events reported by the patient or nursing staff.  Objective: Vitals:   11/23/19 0431 11/23/19 0757 11/23/19 0928 11/23/19 1420  BP: 122/71 106/79 112/75 129/84  Pulse: (!) 105 92 (!) 103   Resp: 19 19    Temp: 98.5 F (36.9 C) 98.1 F (36.7 C)    TempSrc: Oral Oral    SpO2: 98% 97%    Weight: 75.5 kg     Height:  Intake/Output Summary (Last 24 hours) at 11/23/2019 1923 Last data filed at 11/23/2019 1810 Gross per 24 hour  Intake 360 ml  Output 900 ml  Net -540 ml   Filed Weights   11/20/19 0532 11/22/19 0332 11/23/19 0431  Weight: 70.4 kg 72.4 kg 75.5 kg    Examination:  General exam: Appears calm and comfortable  Respiratory system: Clear to auscultation. Respiratory effort normal. Cardiovascular  system: S1 & S2 heard, RRR. No JVD, murmurs, rubs, gallops or clicks. No pedal edema. Gastrointestinal system: Abdomen is nondistended, soft and nontender. No organomegaly or masses felt. Normal bowel sounds heard. Central nervous system: Alert and oriented. No focal neurological deficits. Extremities: Bilateral BKA.  Mild erythema at the staple line on the left but no sign of infection. Skin: No rashes, lesions or ulcers Psychiatry: Judgement and insight appear normal. Mood & affect appropriate.     Data Reviewed: I have personally reviewed following labs and imaging studies  CBC: Recent Labs  Lab 11/17/19 0143 11/17/19 0143 11/19/19 1822 11/19/19 1822 11/20/19 0348 11/20/19 1217 11/21/19 0247 11/21/19 1035 11/21/19 1831 11/22/19 0247 11/23/19 0300  WBC 10.5  --  11.5*  --  13.5*  --  13.6*  --   --   --  10.3  HGB 8.4*   < > 8.7*   < > 8.2*   < > 7.5* 8.2* 8.1* 7.8* 8.1*  HCT 27.9*   < > 28.9*   < > 27.0*   < > 25.2* 26.0* 27.0* 25.8* 27.2*  MCV 81.8  --  81.0  --  80.8  --  81.6  --   --   --  81.4  PLT 700*  --  624*  --  625*  --  563*  --   --   --  705*   < > = values in this interval not displayed.   Basic Metabolic Panel: Recent Labs  Lab 11/17/19 0143 11/19/19 1822 11/21/19 0247 11/23/19 0300  NA 134* 131* 130* 136  K 3.5 3.8 3.6 3.9  CL 100 97* 96* 103  CO2 24 22 22 22   GLUCOSE 187* 279* 213* 226*  BUN 9 17 15 18   CREATININE 0.87 1.15 0.95 0.97  CALCIUM 8.3* 8.3* 8.1* 8.3*   GFR: Estimated Creatinine Clearance: 54.8 mL/min (by C-G formula based on SCr of 0.97 mg/dL). Liver Function Tests: Recent Labs  Lab 11/19/19 1822 11/23/19 0300  AST 52* 36  ALT 28 28  ALKPHOS 145* 147*  BILITOT 0.4 0.4  PROT 6.7 6.5  ALBUMIN 2.2* 2.0*   No results for input(s): LIPASE, AMYLASE in the last 168 hours. No results for input(s): AMMONIA in the last 168 hours. Coagulation Profile: No results for input(s): INR, PROTIME in the last 168 hours. Cardiac  Enzymes: No results for input(s): CKTOTAL, CKMB, CKMBINDEX, TROPONINI in the last 168 hours. BNP (last 3 results) No results for input(s): PROBNP in the last 8760 hours. HbA1C: No results for input(s): HGBA1C in the last 72 hours. CBG: Recent Labs  Lab 11/22/19 1647 11/22/19 2056 11/23/19 0755 11/23/19 1214 11/23/19 1722  GLUCAP 232* 267* 216* 281* 226*   Lipid Profile: No results for input(s): CHOL, HDL, LDLCALC, TRIG, CHOLHDL, LDLDIRECT in the last 72 hours. Thyroid Function Tests: No results for input(s): TSH, T4TOTAL, FREET4, T3FREE, THYROIDAB in the last 72 hours. Anemia Panel: No results for input(s): VITAMINB12, FOLATE, FERRITIN, TIBC, IRON, RETICCTPCT in the last 72 hours. Sepsis Labs: Recent Labs  Lab 11/19/19 1822 11/20/19  0348  PROCALCITON 0.18 0.25    Recent Results (from the past 240 hour(s))  Culture, blood (routine x 2)     Status: None (Preliminary result)   Collection Time: 11/19/19  5:58 PM   Specimen: BLOOD LEFT HAND  Result Value Ref Range Status   Specimen Description BLOOD LEFT HAND  Final   Special Requests   Final    BOTTLES DRAWN AEROBIC AND ANAEROBIC Blood Culture adequate volume   Culture   Final    NO GROWTH 4 DAYS Performed at [facility], [place] [address], [place], [place] [address]    Report Status PENDING  Incomplete  Culture, blood (routine x 2)     Status: None (Preliminary result)   Collection Time: 11/19/19  5:58 PM   Specimen: BLOOD RIGHT HAND  Result Value Ref Range Status   Specimen Description BLOOD RIGHT HAND  Final   Special Requests   Final    BOTTLES DRAWN AEROBIC AND ANAEROBIC Blood Culture adequate volume   Culture   Final    NO GROWTH 4 DAYS Performed at [facility], [place] [address] [place], [place] [address]    Report Status PENDING  Incomplete  Culture, Urine     Status: Abnormal (Preliminary result)   Collection Time: 11/20/19 10:07 AM   Specimen: Urine, Catheterized  Result Value Ref Range Status    Specimen Description URINE, CATHETERIZED  Final   Special Requests NONE  Final   Culture (A)  Final    >=100,000 COLONIES/mL PSEUDOMONAS AERUGINOSA >=100,000 COLONIES/mL YEAST Sent to [place] for further susceptibility testing. Performed at [facility], [place] [address], [place], [place] [address]    Report Status PENDING  Incomplete         Radiology Studies: No results found.      Scheduled Meds: . sodium chloride   Intravenous Once  . sodium chloride   Intravenous Once  . amitriptyline  20 mg Oral QHS  . apixaban  5 mg Oral BID  . Chlorhexidine Gluconate Cloth  6 each Topical Daily  . clopidogrel  75 mg Oral Daily  . colchicine  0.6 mg Oral Daily  . diltiazem  300 mg Oral Daily  . docusate  100 mg Oral Daily  . ferrous sulfate  325 mg Oral Q breakfast  . insulin aspart  0-5 Units Subcutaneous QHS  . insulin aspart  0-9 Units Subcutaneous TID WC  . insulin glargine  10 Units Subcutaneous QHS  . levothyroxine  100 mcg Oral Q breakfast  . metoprolol succinate  50 mg Oral Daily  . multivitamin with minerals  1 tablet Oral Daily  . oxybutynin  5 mg Oral QHS  . pantoprazole  40 mg Oral QHS  . polyethylene glycol  17 g Oral Daily  . pravastatin  10 mg Oral Q supper  . tobramycin  2 drop Both Eyes Q4H while awake   Continuous Infusions: . sodium chloride Stopped (11/20/19 1949)  . ceFEPime (MAXIPIME) IV 2 g (11/23/19 6503)  . magnesium sulfate bolus IVPB       LOS: 18 days    Time spent:     Edmonia Lynch, MD [facility] Pager [phone]  If 7PM-7AM, please contact night-coverage [email or web address] Password [email or web address] 11/23/2019, 7:23 PM

## 2019-11-23 NOTE — Progress Notes (Signed)
Inpatient Diabetes Program Recommendations  AACE/ADA: New Consensus Statement on Inpatient Glycemic Control (2015)  Target Ranges:  Prepandial:   less than 140 mg/dL      Peak postprandial:   less than 180 mg/dL (1-2 hours)      Critically ill patients:  140 - 180 mg/dL   Lab Results  Component Value Date   GLUCAP 281 (H) 11/23/2019   HGBA1C 8.5 (H) 11/05/2019    Review of Glycemic Control Results for Anthony Dunlap, Anthony Dunlap (MRN [id number]) as of 11/23/2019 15:49  Ref. Range 11/22/2019 11:53 11/22/2019 16:47 11/22/2019 20:56 11/23/2019 07:55 11/23/2019 12:14  Glucose-Capillary Latest Ref Range: 70 - 99 mg/dL 225 (H) 232 (H) 267 (H) 216 (H) 281 (H)    Inpatient Diabetes Program Recommendations:    1-Lantus 15 units daily 2-Novolog 2-3 units tid with meals if eats at least 50% of meal  Will continue to follow while inpatient.  Thank you, Reche Dixon, RN, BSN Diabetes Coordinator Inpatient Diabetes Program [phone] (team pager from 8a-5p)

## 2019-11-23 NOTE — Progress Notes (Signed)
Physical Therapy Treatment Patient Details Name: Anthony Dunlap MRN: [id number] DOB: 1933-11-19 Today's Date: 11/23/2019    History of Present Illness Pt is an 86 y.o. male admitted from SNF on 11/05/19 with sepsis, nonhealing L foot ulcer, afib with RVR. 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  Post L BKA 9/22.    PT Comments    Patient progressing slowly towards PT goals. Tolerated there ex of bilateral BKAs without difficulty. Requires repetition and simple 1-2 word instructions to follow commands. Requires Mod A for bed mobility and Max A of 2 to laterally scoot into chair. Pt attempting to assist with scooting however with tendency to posterior lean scooting hips too far anterior off bed. HR up to 140 bpm with activity. Able to perform chair push ups clearing bottom minimally off chair for pressure relief. Will follow.   Follow Up Recommendations  SNF     Equipment Recommendations  Other (comment) (defer)    Recommendations for Other Services       Precautions / Restrictions Precautions Precautions: Fall Precaution Comments: R BKA, s/p 9/22 L BKA Restrictions Weight Bearing Restrictions: No RLE Weight Bearing: Non weight bearing LLE Weight Bearing: Non weight bearing    Mobility  Bed Mobility Overal bed mobility: Needs Assistance Bed Mobility: Supine to Sit     Supine to sit: Mod assist;HOB elevated     General bed mobility comments: Assist to elevate trunk and scoot bottom to EOB; increased time and effort. requires UE support in sittng.  Transfers Overall transfer level: Needs assistance Equipment used: 2 person hand held assist Transfers: Lateral/Scoot Transfers          Lateral/Scoot Transfers: Max assist;+2 physical assistance General transfer comment: Max A of 2 to scoot to drop arm recliner; pt attempting to assist with scooting but  has a tendency to lean posteriorly. Able to scoot hips back onto chair with cues.  Ambulation/Gait                 Stairs             Wheelchair Mobility    Modified Rankin (Stroke Patients Only)       Balance Overall balance assessment: Needs assistance Sitting-balance support: Feet unsupported;Single extremity supported Sitting balance-Leahy Scale: Poor Sitting balance - Comments: CLose Min guard without UE support statically, needs Min A for dynamic tasks.       Standing balance comment: unable                            Cognition Arousal/Alertness: Awake/alert Behavior During Therapy: WFL for tasks assessed/performed Overall Cognitive Status: No family/caregiver present to determine baseline cognitive functioning Area of Impairment: Orientation;Attention;Memory;Following commands;Safety/judgement;Awareness;Problem solving                 Orientation Level: Disoriented to;Situation;Time Current Attention Level: Selective Memory: Decreased short-term memory Following Commands: Follows multi-step commands inconsistently;Follows one step commands with increased time Safety/Judgement: Decreased awareness of safety;Decreased awareness of deficits Awareness: Emergent Problem Solving: Difficulty sequencing;Requires verbal cues;Requires tactile cues;Decreased initiation General Comments: Needs repetition of cues to follow commands with increased time. Oriented to person and place.      Exercises Amputee Exercises Quad Sets: AROM;Both;10 reps;Supine Hip ABduction/ADduction: AROM;Both;10 reps;Supine Hip Flexion/Marching: AROM;Both;10 reps;Supine Knee Flexion: AROM;Both;10 reps;Supine Straight Leg Raises: AROM;Both;5 reps;Supine Chair Push Up: Both;5 reps (able to  clear bottom minimally)    General Comments General comments (skin integrity, edema, etc.): HR up to 140 bpm with activity.      Pertinent Vitals/Pain Pain Assessment:  Faces Faces Pain Scale: No hurt    Home Living                      Prior Function            PT Goals (current goals can now be found in the care plan section) Progress towards PT goals: Progressing toward goals (slowly)    Frequency    Min 3X/week      PT Plan Current plan remains appropriate    Co-evaluation              AM-PAC PT "6 Clicks" Mobility   Outcome Measure  Help needed turning from your back to your side while in a flat bed without using bedrails?: A Lot Help needed moving from lying on your back to sitting on the side of a flat bed without using bedrails?: A Lot Help needed moving to and from a bed to a chair (including a wheelchair)?: Total Help needed standing up from a chair using your arms (e.g., wheelchair or bedside chair)?: Total Help needed to walk in hospital room?: Total Help needed climbing 3-5 steps with a railing? : Total 6 Click Score: 8    End of Session Equipment Utilized During Treatment: Gait belt Activity Tolerance: Patient limited by fatigue;Patient tolerated treatment well Patient left: in chair;with call bell/phone within reach;with chair alarm set Nurse Communication: Mobility status;Need for lift equipment;Other (comment) (transfer technique to get back to bed, encouraged lift vs lateral scoot) PT Visit Diagnosis: Other abnormalities of gait and mobility (R26.89);Muscle weakness (generalized) (M62.81);Difficulty in walking, not elsewhere classified (R26.2)     Time: 8003-4917 PT Time Calculation (min) (ACUTE ONLY): 20 min  Charges:  $Therapeutic Activity: 8-22 mins                     Marisa Severin, PT, DPT Acute Rehabilitation Services Pager [phone] Office [phone]       Marguarite Arbour A Sabra Heck 11/23/2019, 12:37 PM

## 2019-11-23 NOTE — Progress Notes (Signed)
Progress Note  Patient Name: Anthony Dunlap Date of Encounter: 11/23/2019  [facility] Cardiologist: Carlyle Dolly, MD   Subjective   No chest pain, SOB or palpitations.   Inpatient Medications    Scheduled Meds: . sodium chloride   Intravenous Once  . sodium chloride   Intravenous Once  . amitriptyline  20 mg Oral QHS  . apixaban  5 mg Oral BID  . Chlorhexidine Gluconate Cloth  6 each Topical Daily  . clopidogrel  75 mg Oral Daily  . colchicine  0.6 mg Oral Daily  . diltiazem  60 mg Oral Q6H  . docusate  100 mg Oral Daily  . ferrous sulfate  325 mg Oral Q breakfast  . insulin aspart  0-5 Units Subcutaneous QHS  . insulin aspart  0-9 Units Subcutaneous TID WC  . insulin glargine  10 Units Subcutaneous QHS  . levothyroxine  100 mcg Oral Q breakfast  . metoprolol succinate  50 mg Oral Daily  . multivitamin with minerals  1 tablet Oral Daily  . oxybutynin  5 mg Oral QHS  . pantoprazole  40 mg Oral QHS  . polyethylene glycol  17 g Oral Daily  . pravastatin  10 mg Oral Q supper  . tobramycin  2 drop Both Eyes Q4H while awake   Continuous Infusions: . sodium chloride Stopped (11/20/19 1949)  . ceFEPime (MAXIPIME) IV Stopped (11/22/19 2250)  . magnesium sulfate bolus IVPB     PRN Meds: acetaminophen **OR** acetaminophen, ALPRAZolam, alum & mag hydroxide-simeth, guaiFENesin-dextromethorphan, hydrALAZINE, labetalol, liver oil-zinc oxide, magnesium sulfate bolus IVPB, metoprolol tartrate, morphine injection, ondansetron, oxyCODONE-acetaminophen, phenol, potassium chloride   Vital Signs    Vitals:   11/23/19 0004 11/23/19 0431 11/23/19 0757 11/23/19 0928  BP: 105/69 122/71 106/79 112/75  Pulse: 98 (!) 105 92 (!) 103  Resp: 18 19 19    Temp: 98.7 F (37.1 C) 98.5 F (36.9 C) 98.1 F (36.7 C)   TempSrc: Oral Oral Oral   SpO2: 97% 98% 97%   Weight:  75.5 kg    Height:        Intake/Output Summary (Last 24 hours) at 11/23/2019 0941 Last data filed at 11/23/2019  0823 Gross per 24 hour  Intake 398.5 ml  Output 1050 ml  Net -651.5 ml   Last 3 Weights 11/23/2019 11/22/2019 11/20/2019  Weight (lbs) 166 lb 7.2 oz 159 lb 9.8 oz 155 lb 3.3 oz  Weight (kg) 75.5 kg 72.4 kg 70.4 kg      Telemetry    afib at 100s-120s- Personally Reviewed  ECG    N/A  Physical Exam   GEN: No acute distress.   Neck: No JVD Cardiac: irregular tachycardic, no murmurs, rubs, or gallops.  Respiratory: Clear to auscultation bilaterally. GI: Soft, nontender, non-distended  MS: No edema; No deformity. Neuro:  Nonfocal  Psych: Normal affect   Labs    High Sensitivity Troponin:  No results for input(s): TROPONINIHS in the last 720 hours.    Chemistry Recent Labs  Lab 11/19/19 1822 11/21/19 0247 11/23/19 0300  NA 131* 130* 136  K 3.8 3.6 3.9  CL 97* 96* 103  CO2 22 22 22   GLUCOSE 279* 213* 226*  BUN 17 15 18   CREATININE 1.15 0.95 0.97  CALCIUM 8.3* 8.1* 8.3*  PROT 6.7  --  6.5  ALBUMIN 2.2*  --  2.0*  AST 52*  --  36  ALT 28  --  28  ALKPHOS 145*  --  147*  BILITOT 0.4  --  0.4  GFRNONAA 58* >60 >60  GFRAA >60 >60 >60  ANIONGAP 12 12 11      Hematology Recent Labs  Lab 11/20/19 0348 11/20/19 1217 11/21/19 0247 11/21/19 1035 11/21/19 1831 11/22/19 0247 11/23/19 0300  WBC 13.5*  --  13.6*  --   --   --  10.3  RBC 3.34*  --  3.09*  --   --   --  3.34*  HGB 8.2*   < > 7.5*   < > 8.1* 7.8* 8.1*  HCT 27.0*   < > 25.2*   < > 27.0* 25.8* 27.2*  MCV 80.8  --  81.6  --   --   --  81.4  MCH 24.6*  --  24.3*  --   --   --  24.3*  MCHC 30.4  --  29.8*  --   --   --  29.8*  RDW 15.9*  --  16.0*  --   --   --  16.3*  PLT 625*  --  563*  --   --   --  705*   < > = values in this interval not displayed.     Radiology    No results found.  Cardiac Studies   Echo 11/06/19 1. Left ventricular ejection fraction, by estimation, is 60 to 65%. The  left ventricle has normal function. Left ventricular endocardial border  not optimally defined to  evaluate regional wall motion. There is mild left  ventricular hypertrophy. Left  ventricular diastolic parameters are indeterminate.  2. Right ventricular systolic function is normal. The right ventricular  size is normal.  3. Left atrial size was mildly dilated.  4. The pericardial effusion is circumferential.  5. The mitral valve is normal in structure. Trivial mitral valve  regurgitation. No evidence of mitral stenosis.  6. The aortic valve was not well visualized. Aortic valve regurgitation  is not visualized. No aortic stenosis is present.   Patient Profile     86 y.o. male with history ofDM, HLD, prostate CA, PAD s/p L tibial PTA 08/19/2019 w/ subsequent R-BKA,pelvic/right hip osteomyelitis on chronic antibiotic suppressive therapy. Hewas admitted9/6/21 with sepsis and left heal pressure ulcer with underlying calcaneal osteomyelitis, also found to have worsening anemia requiring transfusion. Cardiology following for atrial fibrillation. He was on DAPT prior to admission.  Assessment & Plan    1. Paroxysmal atrial fibrillation with RVR - Patient was in sinus rhythm when last seen by Dr. Lovena Le 11/15/19 and resumed Eliquis 5mg  BID. His Eliquis and plavix were held on 10/01 and 10/2 due to gross hematuria then resumed. Last dose of ASA 9/26. Hemoglobin is stable at 8. - Cardiology is asked to see again 10/3 due to recurrent afib RVR.  - On Toprol XL 50mg  qd and diltiazem 60mg  q 6 hours>>> HR in 100-120s and SBP in 110s. Patient is asymptomatic. Can consider increasing diltiazem or add PRN IV rate control agent. Will review with MD.   2. Anemia - Hgb stable around 8 - Per primary team        For questions or updates, please contact [place] Please consult [email or web address] for contact info under        SignedLeanor Kail, PA  11/23/2019, 9:41 AM

## 2019-11-24 ENCOUNTER — Ambulatory Visit: Payer: Medicare Other | Admitting: Vascular Surgery

## 2019-11-24 DIAGNOSIS — I483 Typical atrial flutter: Secondary | ICD-10-CM

## 2019-11-24 LAB — BASIC METABOLIC PANEL
Anion gap: 12 (ref 5–15)
BUN: 18 mg/dL (ref 8–23)
CO2: 21 mmol/L — ABNORMAL LOW (ref 22–32)
Calcium: 8.7 mg/dL — ABNORMAL LOW (ref 8.9–10.3)
Chloride: 105 mmol/L (ref 98–111)
Creatinine, Ser: 0.99 mg/dL (ref 0.61–1.24)
GFR calc Af Amer: >60 mL/min (ref >60)
GFR calc non Af Amer: >60 mL/min (ref >60)
Glucose, Bld: 198 mg/dL — ABNORMAL HIGH (ref 70–99)
Potassium: 3.9 mmol/L (ref 3.5–5.1)
Sodium: 138 mmol/L (ref 135–145)

## 2019-11-24 LAB — CBC
HCT: 30.9 % — ABNORMAL LOW (ref 39.0–52.0)
Hemoglobin: 9.4 g/dL — ABNORMAL LOW (ref 13.0–17.0)
MCH: 24.9 pg — ABNORMAL LOW (ref 26.0–34.0)
MCHC: 30.4 g/dL (ref 30.0–36.0)
MCV: 82 fL (ref 80.0–100.0)
Platelets: 886 10*3/uL — ABNORMAL HIGH (ref 150–400)
RBC: 3.77 MIL/uL — ABNORMAL LOW (ref 4.22–5.81)
RDW: 16.3 % — ABNORMAL HIGH (ref 11.5–15.5)
WBC: 13.5 10*3/uL — ABNORMAL HIGH (ref 4.0–10.5)
nRBC: 0 % (ref 0.0–0.2)

## 2019-11-24 LAB — GLUCOSE, CAPILLARY
Glucose-Capillary: 237 mg/dL — ABNORMAL HIGH (ref 70–99)
Glucose-Capillary: 323 mg/dL — ABNORMAL HIGH (ref 70–99)
Glucose-Capillary: 181 mg/dL — ABNORMAL HIGH (ref 70–99)
Glucose-Capillary: 242 mg/dL — ABNORMAL HIGH (ref 70–99)
Glucose-Capillary: 248 mg/dL — ABNORMAL HIGH (ref 70–99)

## 2019-11-24 LAB — CULTURE, BLOOD (ROUTINE X 2)
Culture: NO GROWTH
Culture: NO GROWTH
Special Requests: ADEQUATE
Special Requests: ADEQUATE

## 2019-11-24 MED ORDER — METOPROLOL TARTRATE 100 MG PO TABS: 100.0000 mg | ORAL_TABLET | Freq: Every morning | ORAL | Status: DC

## 2019-11-24 MED ORDER — AMIODARONE LOAD VIA INFUSION
150.0000 mg | Freq: Once | INTRAVENOUS | Status: AC
  Administered 2019-11-24: 150 mg via INTRAVENOUS
  Filled 2019-11-24: qty 83.34

## 2019-11-24 MED ORDER — METOPROLOL SUCCINATE ER 100 MG PO TB24
100.0000 mg | ORAL_TABLET | Freq: Every day | ORAL | Status: DC
  Administered 2019-11-24: 100 mg via ORAL
  Filled 2019-11-24: qty 1

## 2019-11-24 MED ORDER — AMIODARONE HCL IN DEXTROSE 360-4.14 MG/200ML-% IV SOLN
60.0000 mg/h | INTRAVENOUS | Status: DC
  Administered 2019-11-24 (×2): 60 mg/h via INTRAVENOUS
  Filled 2019-11-24 (×2): qty 200

## 2019-11-24 MED ORDER — AMIODARONE HCL IN DEXTROSE 360-4.14 MG/200ML-% IV SOLN
30.0000 mg/h | INTRAVENOUS | Status: DC
  Administered 2019-11-25: 30 mg/h via INTRAVENOUS
  Filled 2019-11-24 (×2): qty 200

## 2019-11-24 NOTE — Progress Notes (Signed)
Progress Note  Patient Name: Anthony Dunlap Date of Encounter: 11/24/2019  Primary Cardiologist: Carlyle Dolly, MD   Subjective   Feeling fine this morning. No complaints of chest pain, SOB, or palpitations.   Inpatient Medications    Scheduled Meds: . sodium chloride   Intravenous Once  . sodium chloride   Intravenous Once  . amitriptyline  20 mg Oral QHS  . apixaban  5 mg Oral BID  . Chlorhexidine Gluconate Cloth  6 each Topical Daily  . clopidogrel  75 mg Oral Daily  . colchicine  0.6 mg Oral Daily  . diltiazem  300 mg Oral Daily  . docusate  100 mg Oral Daily  . ferrous sulfate  325 mg Oral Q breakfast  . insulin aspart  0-5 Units Subcutaneous QHS  . insulin aspart  0-9 Units Subcutaneous TID WC  . insulin glargine  10 Units Subcutaneous QHS  . levothyroxine  100 mcg Oral Q breakfast  . metoprolol succinate  50 mg Oral Daily  . multivitamin with minerals  1 tablet Oral Daily  . oxybutynin  5 mg Oral QHS  . pantoprazole  40 mg Oral QHS  . polyethylene glycol  17 g Oral Daily  . pravastatin  10 mg Oral Q supper  . tobramycin  2 drop Both Eyes Q4H while awake   Continuous Infusions: . sodium chloride Stopped (11/20/19 1949)  . ceFEPime (MAXIPIME) IV 2 g (11/23/19 2223)  . magnesium sulfate bolus IVPB     PRN Meds: acetaminophen **OR** acetaminophen, ALPRAZolam, alum & mag hydroxide-simeth, guaiFENesin-dextromethorphan, hydrALAZINE, labetalol, liver oil-zinc oxide, magnesium sulfate bolus IVPB, metoprolol tartrate, morphine injection, ondansetron, oxyCODONE-acetaminophen, phenol, potassium chloride   Vital Signs    Vitals:   11/23/19 1420 11/23/19 1930 11/23/19 2323 11/24/19 0414  BP: 129/84 119/71 110/74   Pulse:  98 100 (!) 112  Resp:  18 20 18   Temp:  98.4 F (36.9 C) 98.7 F (37.1 C) 98.4 F (36.9 C)  TempSrc:  Oral Oral Oral  SpO2:  98% 97% 98%  Weight:    75.6 kg  Height:        Intake/Output Summary (Last 24 hours) at 11/24/2019 0807 Last data  filed at 11/24/2019 0601 Gross per 24 hour  Intake 460 ml  Output 650 ml  Net -190 ml   Filed Weights   11/22/19 0332 11/23/19 0431 11/24/19 0414  Weight: 72.4 kg 75.5 kg 75.6 kg    Telemetry    Atrial fibrillation with HR in the 90s-130s - Personally Reviewed  ECG    No new tracings - Personally Reviewed  Physical Exam   GEN: No acute distress.   Neck: No JVD, no carotid bruits Cardiac: IRIR, no murmurs, rubs, or gallops.  Respiratory: Clear to auscultation bilaterally, no wheezes/ rales/ rhonchi GI: NABS, Soft, nontender, non-distended  MS: No edema; s/p bilateral BKA - left incision site is healing well, staples in place Neuro:  Nonfocal, moving all extremities spontaneously Psych: Normal affect   Labs    Chemistry Recent Labs  Lab 11/19/19 1822 11/19/19 1822 11/21/19 0247 11/23/19 0300 11/24/19 0254  NA 131*   < > 130* 136 138  K 3.8   < > 3.6 3.9 3.9  CL 97*   < > 96* 103 105  CO2 22   < > 22 22 21*  GLUCOSE 279*   < > 213* 226* 198*  BUN 17   < > 15 18 18   CREATININE 1.15   < >  0.95 0.97 0.99  CALCIUM 8.3*   < > 8.1* 8.3* 8.7*  PROT 6.7  --   --  6.5  --   ALBUMIN 2.2*  --   --  2.0*  --   AST 52*  --   --  36  --   ALT 28  --   --  28  --   ALKPHOS 145*  --   --  147*  --   BILITOT 0.4  --   --  0.4  --   GFRNONAA 58*   < > >60 >60 >60  GFRAA >60   < > >60 >60 >60  ANIONGAP 12   < > 12 11 12    < > = values in this interval not displayed.     Hematology Recent Labs  Lab 11/21/19 0247 11/21/19 1035 11/22/19 0247 11/23/19 0300 11/24/19 0254  WBC 13.6*  --   --  10.3 13.5*  RBC 3.09*  --   --  3.34* 3.77*  HGB 7.5*   < > 7.8* 8.1* 9.4*  HCT 25.2*   < > 25.8* 27.2* 30.9*  MCV 81.6  --   --  81.4 82.0  MCH 24.3*  --   --  24.3* 24.9*  MCHC 29.8*  --   --  29.8* 30.4  RDW 16.0*  --   --  16.3* 16.3*  PLT 563*  --   --  705* 886*   < > = values in this interval not displayed.    Cardiac EnzymesNo results for input(s): TROPONINI in the last  168 hours. No results for input(s): TROPIPOC in the last 168 hours.   BNPNo results for input(s): BNP, PROBNP in the last 168 hours.   DDimer No results for input(s): DDIMER in the last 168 hours.   Radiology    No results found.  Cardiac Studies   Echo 11/06/19 1. Left ventricular ejection fraction, by estimation, is 60 to 65%. The  left ventricle has normal function. Left ventricular endocardial border  not optimally defined to evaluate regional wall motion. There is mild left  ventricular hypertrophy. Left  ventricular diastolic parameters are indeterminate.  2. Right ventricular systolic function is normal. The right ventricular  size is normal.  3. Left atrial size was mildly dilated.  4. The pericardial effusion is circumferential.  5. The mitral valve is normal in structure. Trivial mitral valve  regurgitation. No evidence of mitral stenosis.  6. The aortic valve was not well visualized. Aortic valve regurgitation  is not visualized. No aortic stenosis is present.   Patient Profile     86 y.o. male with history ofDM, HLD, prostate CA, PAD s/p L tibial PTA 08/19/2019 w/ subsequent R-BKA,pelvic/right hip osteomyelitis on chronic antibiotic suppressive therapy. Hewas admitted9/6/21 with sepsis and left heal pressure ulcer with underlying calcaneal osteomyelitis, also found to have worsening anemia requiring transfusion. Cardiology following for atrial fibrillation. He was on DAPT prior to admission.  Assessment & Plan    1. Paroxysmal atrial fibrillation with RVR: recurrent episodes this admission, maintained sinus rhythm when last seen by Dr. Lovena Le 11/15/19. Management complicated by hematuria with brief hold in eliquis 10/1-10/2. Hgb stable at 9.4 today. Cardiology asked to see again 11/22/19 for Afib with RVR. HR generally stable in the 90s-110s, occasionally up to 130s on diltiazem 300mg  daily and metoprolol succinate 50mg  daily. He is tolerating Afib without  complaints. - Will increase metoprolol to 100mg  daily and given AM dose early - Continue diltiazem -  Continue apixaban for stroke ppx  2. L calcaneal osteomyelitis s/p L BKA 11/11/19: S/p course of IV antibiotics. Infection and post-op status likely contributing to #1 - Continue management per primary team.   3. UTI: continued on IV cefepime. Likely contributing to #1.  - Continue management per primary team  4. PAD: s/p bilateral BKA - Continue plavix and statin    For questions or updates, please contact [place] Please consult [email or web address] for contact info under Cardiology/STEMI.      Signed, Abigail Butts, PA-C  11/24/2019, 8:07 AM   [phone]

## 2019-11-24 NOTE — Progress Notes (Signed)
Inpatient Diabetes Program Recommendations  AACE/ADA: New Consensus Statement on Inpatient Glycemic Control (2015)  Target Ranges:  Prepandial:   less than 140 mg/dL      Peak postprandial:   less than 180 mg/dL (1-2 hours)      Critically ill patients:  140 - 180 mg/dL   Lab Results  Component Value Date   GLUCAP 323 (H) 11/24/2019   HGBA1C 8.5 (H) 11/05/2019    Review of Glycemic Control Results for GOKU, HARB (MRN [id number]) as of 11/24/2019 13:23  Ref. Range 11/23/2019 12:14 11/23/2019 17:22 11/23/2019 21:09 11/24/2019 07:36 11/24/2019 12:03  Glucose-Capillary Latest Ref Range: 70 - 99 mg/dL 281 (H) 226 (H) 155 (H) 237 (H) 323 (H)     Inpatient Diabetes Program Recommendations:     Lantus 15 units daily  Novolog 2-3 units tid with meals if eats at least 50% of meals  Will continue to follow while inpatient.  Thank you, Reche Dixon, RN, BSN Diabetes Coordinator Inpatient Diabetes Program [phone] (team pager from 8a-5p)

## 2019-11-24 NOTE — Progress Notes (Signed)
OT Cancellation Note  Patient Details Name: Anthony Dunlap MRN: [id number] DOB: 1933-04-09   Cancelled Treatment:        c/r. Pt. And family member Upset about shrinker not being on r and not having one for l le. Called nurse to let her know that pt. And dtr upset.  Wed:1   [name] 11/24/2019, 11:56 AM

## 2019-11-24 NOTE — Progress Notes (Signed)
PROGRESS NOTE    Anthony Dunlap  JYN:[id number] DOB: 1933-08-27 DOA: 11/05/2019 PCP: [name], MD (Confirm with patient/family/NH records and if not entered, this HAS to be entered at [facility] point of entry. "No PCP" if truly none.)   Brief Narrative: (Start on day 1 of progress note - keep it brief and live) Patient is an 86 year old male with history of peripheral arterial disease, prostate cancer, chronic anemia and hypothyroidism admitted for new onset atrial fibrillation with RVR and left heel ulcer.  Patient is s/p BKA and atrial fibrillation is also controlled Cardizem and metoprolol.  Patient also has a history of anemia of chronic disease and hemoglobin is stable at this time.  Patient went into atrial flutter with rapid ventricular rate cardiology started the patient on IV amiodarone and recommended Lopressor 100 mg daily.   Assessment & Plan:   Principal Problem:   Acute osteomyelitis of left calcaneus ([name]) Active Problems: Atrial flutter   HLD (hyperlipidemia)   Chronic anemia   Hypothyroidism   Pressure ulcer   Sepsis ([name])   Atrial fibrillation with RVR ([name])   Typical atrial flutter ([name])  Atrial flutter Patient was in atrial flutter this morning with a heart rate between 1 30-1 40.  Cardiology evaluated the patient and recommended IV amiodarone and metoprolol 100 mg daily.  Patient will have TEE/DCCV tomorrow or Thursday if flutter persists as per cardiology recommendations.  Denies any complaints at this time.  Left heel pressure ulcer with underlying calcaneal osteomyelitis s/p  BKA  Patient presented with left heel pressure ulcer calcaneal osteomyelitis at the time of admission.  Patient is s/p BKA.  Patient completed a course of daptomycin and Unasyn.  No infection present at the stump site.  Staples will be removed in 3 to 4 weeks.  PT/OT recommended SNF placement.  Sepsis secondary to above present on admission, resolved See above  Atrial fibrillation  with RVR  Patient is on Eliquis and Cardizem IR and he has an sinus rhythm.  IV Lopressor as needed if patient go back into A. fib with RVR.  Urinary tract infection Continue cefepime  Gross hematuria, resolved  Hematuria has completely resolved.  Eliquis and Plavix were ordered because of acute gross hematuria.  Eliquis and Plavix restarted by cardiology and hemoglobin is stable.  No more hematuria.  Diabetes mellitus type 2 Continue Lantus 10 units daily and sliding scale insulin.  Blood glucose monitoring and hypoglycemic protocol in place  Anxiety/depression Continue amitriptyline.  No signs and symptoms of anxiety and depression.   Gout Continue colchicine as needed  Peripheral vascular disease status post bilateral BKA Continue Plavix.  History of prostate cancer  Patient has a permanent Foley catheter.  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  Supportive management     DVT prophylaxis: Eliquis Code Status: Full code Family Communication: Patient's daughter present at the bedside Disposition Plan: PT recommended SNF     Consultants:  Urology  Infectious disease  Vascular surgery  Procedures: (Don't include imaging studies which can be auto populated. Include things that cannot be auto populated i.e. Echo, Carotid and venous dopplers, Foley, Bipap, HD, tubes/drains, wound vac, central lines etc)  Left BKA  Echocardiogram  EGD Antimicrobials: (specify start and planned stop date. Auto populated tables are space occupying and do not give end dates)  Daptomycin, cefepime   Subjective: Patient is seen and evaluated at the bedside today.  Patient's daughter  is also present at the bedside.  Patient is very calm and denies any complaints at this time.    Objective: Vitals:   11/24/19 0414 11/24/19 0810 11/24/19 0814 11/24/19 1556  BP:  106/64 112/79 109/70  Pulse: (!) 112   77   Resp: 18 18  18   Temp: 98.4 F (36.9 C) 97.6 F (36.4 C)  98.8 F (37.1 C)  TempSrc: Oral Oral  Oral  SpO2: 98% 98%  100%  Weight: 75.6 kg     Height:        Intake/Output Summary (Last 24 hours) at 11/24/2019 1917 Last data filed at 11/24/2019 1800 Gross per 24 hour  Intake 540.48 ml  Output 300 ml  Net 240.48 ml   Filed Weights   11/22/19 0332 11/23/19 0431 11/24/19 0414  Weight: 72.4 kg 75.5 kg 75.6 kg    Examination:  General exam: Appears calm and comfortable  Respiratory system: Clear to auscultation. Respiratory effort normal. Cardiovascular system: Irregularly irregular rhythm with tachycardia.  No JVD, murmurs, rubs, gallops or clicks. No pedal edema. Gastrointestinal system: Abdomen is nondistended, soft and nontender. No organomegaly or masses felt. Normal bowel sounds heard. Central nervous system: Alert and oriented. No focal neurological deficits. Extremities: Bilateral BKA.  Mild erythema at the staple line on left side. Skin: No rashes, lesions or ulcers Psychiatry: Judgement and insight appear normal. Mood & affect appropriate.     Data Reviewed: I have personally reviewed following labs and imaging studies  CBC: Recent Labs  Lab 11/19/19 1822 11/19/19 1822 11/20/19 0348 11/20/19 1217 11/21/19 0247 11/21/19 0247 11/21/19 1035 11/21/19 1831 11/22/19 0247 11/23/19 0300 11/24/19 0254  WBC 11.5*  --  13.5*  --  13.6*  --   --   --   --  10.3 13.5*  HGB 8.7*   < > 8.2*   < > 7.5*   < > 8.2* 8.1* 7.8* 8.1* 9.4*  HCT 28.9*   < > 27.0*   < > 25.2*   < > 26.0* 27.0* 25.8* 27.2* 30.9*  MCV 81.0  --  80.8  --  81.6  --   --   --   --  81.4 82.0  PLT 624*  --  625*  --  563*  --   --   --   --  705* 886*   < > = values in this interval not displayed.   Basic Metabolic Panel: Recent Labs  Lab 11/19/19 1822 11/21/19 0247 11/23/19 0300 11/24/19 0254  NA 131* 130* 136 138  K 3.8 3.6 3.9 3.9  CL 97* 96* 103 105  CO2 22 22 22  21*  GLUCOSE 279* 213*  226* 198*  BUN 17 15 18 18   CREATININE 1.15 0.95 0.97 0.99  CALCIUM 8.3* 8.1* 8.3* 8.7*   GFR: Estimated Creatinine Clearance: 53.7 mL/min (by C-G formula based on SCr of 0.99 mg/dL). Liver Function Tests: Recent Labs  Lab 11/19/19 1822 11/23/19 0300  AST 52* 36  ALT 28 28  ALKPHOS 145* 147*  BILITOT 0.4 0.4  PROT 6.7 6.5  ALBUMIN 2.2* 2.0*   No results for input(s): LIPASE, AMYLASE in the last 168 hours. No results for input(s): AMMONIA in the last 168 hours. Coagulation Profile: No results for input(s): INR, PROTIME in the last 168 hours. Cardiac Enzymes: No results for input(s): CKTOTAL, CKMB, CKMBINDEX, TROPONINI in the last 168 hours. BNP (last 3 results) No results for input(s): PROBNP in the last 8760 hours. HbA1C: No results for  input(s): HGBA1C in the last 72 hours. CBG: Recent Labs  Lab 11/23/19 1722 11/23/19 2109 11/24/19 0736 11/24/19 1203 11/24/19 1720  GLUCAP 226* 155* 237* 323* 181*   Lipid Profile: No results for input(s): CHOL, HDL, LDLCALC, TRIG, CHOLHDL, LDLDIRECT in the last 72 hours. Thyroid Function Tests: No results for input(s): TSH, T4TOTAL, FREET4, T3FREE, THYROIDAB in the last 72 hours. Anemia Panel: No results for input(s): VITAMINB12, FOLATE, FERRITIN, TIBC, IRON, RETICCTPCT in the last 72 hours. Sepsis Labs: Recent Labs  Lab 11/19/19 1822 11/20/19 0348  PROCALCITON 0.18 0.25    Recent Results (from the past 240 hour(s))  Culture, blood (routine x 2)     Status: None   Collection Time: 11/19/19  5:58 PM   Specimen: BLOOD LEFT HAND  Result Value Ref Range Status   Specimen Description BLOOD LEFT HAND  Final   Special Requests   Final    BOTTLES DRAWN AEROBIC AND ANAEROBIC Blood Culture adequate volume   Culture   Final    NO GROWTH 5 DAYS Performed at [facility], [place] [address] [place], [place] [address]    Report Status 11/24/2019 FINAL  Final  Culture, blood (routine x 2)     Status: None   Collection Time:  11/19/19  5:58 PM   Specimen: BLOOD RIGHT HAND  Result Value Ref Range Status   Specimen Description BLOOD RIGHT HAND  Final   Special Requests   Final    BOTTLES DRAWN AEROBIC AND ANAEROBIC Blood Culture adequate volume   Culture   Final    NO GROWTH 5 DAYS Performed at [facility], [place] [address], [place], [place] [address]    Report Status 11/24/2019 FINAL  Final  Culture, Urine     Status: Abnormal (Preliminary result)   Collection Time: 11/20/19 10:07 AM   Specimen: Urine, Catheterized  Result Value Ref Range Status   Specimen Description URINE, CATHETERIZED  Final   Special Requests NONE  Final   Culture (A)  Final    >=100,000 COLONIES/mL PSEUDOMONAS AERUGINOSA >=100,000 COLONIES/mL YEAST Sent to [place] for further susceptibility testing. Performed at [facility], [place] [address], [place], [place] [address]    Report Status PENDING  Incomplete         Radiology Studies: No results found.      Scheduled Meds:  sodium chloride   Intravenous Once   sodium chloride   Intravenous Once   amitriptyline  20 mg Oral QHS   apixaban  5 mg Oral BID   Chlorhexidine Gluconate Cloth  6 each Topical Daily   clopidogrel  75 mg Oral Daily   colchicine  0.6 mg Oral Daily   diltiazem  300 mg Oral Daily   docusate  100 mg Oral Daily   ferrous sulfate  325 mg Oral Q breakfast   insulin aspart  0-5 Units Subcutaneous QHS   insulin aspart  0-9 Units Subcutaneous TID WC   insulin glargine  10 Units Subcutaneous QHS   levothyroxine  100 mcg Oral Q breakfast   [START ON 11/25/2019] metoprolol tartrate  100 mg Oral q morning - 10a   multivitamin with minerals  1 tablet Oral Daily   oxybutynin  5 mg Oral QHS   pantoprazole  40 mg Oral QHS   polyethylene glycol  17 g Oral Daily   pravastatin  10 mg Oral Q supper   tobramycin  2 drop Both Eyes Q4H while awake   Continuous Infusions:  sodium chloride Stopped (11/20/19 1949)   amiodarone 30  mg/hr (11/24/19 1835)   ceFEPime (MAXIPIME) IV Stopped (11/24/19 0947)   magnesium sulfate bolus IVPB       LOS: 19 days    Time spent:     Edmonia Lynch, MD [facility] Pager [phone]  If 7PM-7AM, please contact night-coverage [email or web address] Password  11/24/2019, 7:17 PM

## 2019-11-25 LAB — MISC LABCORP TEST (SEND OUT): Labcorp test code: 182261

## 2019-11-25 LAB — GLUCOSE, CAPILLARY
Glucose-Capillary: 212 mg/dL — ABNORMAL HIGH (ref 70–99)
Glucose-Capillary: 222 mg/dL — ABNORMAL HIGH (ref 70–99)
Glucose-Capillary: 249 mg/dL — ABNORMAL HIGH (ref 70–99)
Glucose-Capillary: 184 mg/dL — ABNORMAL HIGH (ref 70–99)

## 2019-11-25 LAB — CBC
HCT: 26.6 % — ABNORMAL LOW (ref 39.0–52.0)
Hemoglobin: 8.2 g/dL — ABNORMAL LOW (ref 13.0–17.0)
MCH: 25.2 pg — ABNORMAL LOW (ref 26.0–34.0)
MCHC: 30.8 g/dL (ref 30.0–36.0)
MCV: 81.8 fL (ref 80.0–100.0)
Platelets: 847 10*3/uL — ABNORMAL HIGH (ref 150–400)
RBC: 3.25 MIL/uL — ABNORMAL LOW (ref 4.22–5.81)
RDW: 16 % — ABNORMAL HIGH (ref 11.5–15.5)
WBC: 9.6 10*3/uL (ref 4.0–10.5)
nRBC: 0 % (ref 0.0–0.2)

## 2019-11-25 LAB — BASIC METABOLIC PANEL
Anion gap: 10 (ref 5–15)
BUN: 16 mg/dL (ref 8–23)
CO2: 21 mmol/L — ABNORMAL LOW (ref 22–32)
Calcium: 8.5 mg/dL — ABNORMAL LOW (ref 8.9–10.3)
Chloride: 105 mmol/L (ref 98–111)
Creatinine, Ser: 0.96 mg/dL (ref 0.61–1.24)
GFR calc non Af Amer: >60 mL/min (ref >60)
Glucose, Bld: 166 mg/dL — ABNORMAL HIGH (ref 70–99)
Potassium: 3.7 mmol/L (ref 3.5–5.1)
Sodium: 136 mmol/L (ref 135–145)

## 2019-11-25 MED ORDER — METOPROLOL SUCCINATE ER 100 MG PO TB24
100.0000 mg | ORAL_TABLET | Freq: Every day | ORAL | Status: DC
  Administered 2019-11-25 – 2019-11-26 (×2): 100 mg via ORAL
  Filled 2019-11-25 (×2): qty 1

## 2019-11-25 MED ORDER — AMIODARONE HCL 200 MG PO TABS: 200.0000 mg | ORAL_TABLET | Freq: Every day | ORAL | Status: DC

## 2019-11-25 MED ORDER — AMIODARONE HCL 200 MG PO TABS
200.0000 mg | ORAL_TABLET | Freq: Two times a day (BID) | ORAL | Status: DC
  Administered 2019-11-25 – 2019-11-26 (×3): 200 mg via ORAL
  Filled 2019-11-25 (×3): qty 1

## 2019-11-25 NOTE — Progress Notes (Signed)
Physical Therapy Treatment Patient Details Name: Anthony Dunlap MRN: [id number] DOB: [birth year] Today's Date: 11/25/2019    History of Present Illness Pt is an 86 y.o. male admitted from SNF on 11/05/19 with sepsis, nonhealing L foot ulcer, afib with RVR. 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  Post L BKA 9/22.    PT Comments    Pt limited during session due to decreased ability to follow commands and maintain precautions; therapist providing max cueing to maintain NWB, pt unable to follow; pt able to perform supine therex with cueing to continue with exercises; attempted long sit with pt requiring increased assist to maintain balance and fatigued quickly; pt left with therapist placing rolled towels distal to knee to improve knee extension ROM, education to pt not to put pillow under knees; pt continues to be limited by strength, coordination, endurance, cognition and safety, pt will benefit from skilled PT to address deficits and maximize independence with functional mobility prior to discharge, continue to recommend d/c to SNF   Follow Up Recommendations  SNF     Equipment Recommendations       Recommendations for Other Services       Precautions / Restrictions Precautions Precautions: Fall Precaution Comments: R BKA, s/p 9/22 L BKA Restrictions Weight Bearing Restrictions: Yes RLE Weight Bearing: Non weight bearing LLE Weight Bearing: Non weight bearing Other Position/Activity Restrictions: pt unable to maintain NWB; PT entered room with pt pushind down through residual limbs, increased education given and pt stated he understand but continued tto break NWB precautions    Mobility  Bed Mobility Overal bed mobility: Needs Assistance Bed Mobility:  (performed supine>long sit; therapist providing max A intially then mod A following cueing to bring hands  posteriorly to assist with pressing up to long sit)           General bed mobility comments: pt requiring mod A to maintain long sit wtih B UE support posteriorly; pt continues to attempt to move with pushing through residual limbs, therapist terminated movement and provided cueing to press up and scoot back in bed, pt unable to perform and maintain NWB; min A need to scoot up in bed with pt assisting with use of bedrails  Transfers                    Ambulation/Gait                 Stairs             Wheelchair Mobility    Modified Rankin (Stroke Patients Only)       Balance                                            Cognition Arousal/Alertness: Awake/alert Behavior During Therapy: Impulsive   Area of Impairment: Orientation;Attention;Memory;Following commands;Safety/judgement;Awareness;Problem solving                 Orientation Level: Disoriented to;Situation;Time Current Attention Level: Selective Memory: Decreased short-term memory Following Commands: Follows one step commands inconsistently Safety/Judgement: Decreased awareness of safety;Decreased awareness of deficits   Problem Solving: Difficulty sequencing;Requires verbal cues;Requires tactile cues;Decreased initiation        Exercises  quad sets with towel distal to knee with 5 second holds, B x  10 reps; SLR B x 10 reps supine    General Comments General comments (skin integrity, edema, etc.): attempted to perform long sit for balance in bed; pt requiring mod A to maintain even with UE support posteriorly, pt fatigued quickly and unable to maintain long sit with UE support; positioning when session ended with towels distal to knee to facilitate knee extension, education to pt to not put pillows under his knees      Pertinent Vitals/Pain Pain Assessment: No/denies pain    Home Living                      Prior Function            PT Goals  (current goals can now be found in the care plan section) Acute Rehab PT Goals Patient Stated Goal: none stated today PT Goal Formulation: With patient Time For Goal Achievement: 11/26/19 Potential to Achieve Goals: Fair Progress towards PT goals: Not progressing toward goals - comment (pt with decreased awareness and ability to follow commands during session)    Frequency    Min 3X/week      PT Plan Current plan remains appropriate    Dunlap-evaluation              AM-PAC PT "6 Clicks" Mobility   Outcome Measure  Help needed turning from your back to your side while in a flat bed without using bedrails?: A Lot Help needed moving from lying on your back to sitting on the side of a flat bed without using bedrails?: A Lot Help needed moving to and from a bed to a chair (including a wheelchair)?: Total Help needed standing up from a chair using your arms (e.g., wheelchair or bedside chair)?: Total Help needed to walk in hospital room?: Total Help needed climbing 3-5 steps with a railing? : Total 6 Click Score: 8    End of Session   Activity Tolerance: Patient limited by fatigue;Patient tolerated treatment well Patient left: in bed;with call bell/phone within reach;with bed alarm set Nurse Communication: Mobility status;Need for lift equipment;Other (comment) PT Visit Diagnosis: Other abnormalities of gait and mobility (R26.89);Muscle weakness (generalized) (M62.81);Difficulty in walking, not elsewhere classified (R26.2)     Time: 5732-2025 PT Time Calculation (min) (ACUTE ONLY): 11 min  Charges:  $Therapeutic Activity: 8-22 mins                     Anthony Dunlap, DPT Acute Rehabilitation Services [phone]   Anthony Dunlap 11/25/2019, 12:30 PM

## 2019-11-25 NOTE — Progress Notes (Addendum)
Progress Note  Patient Name: Anthony Dunlap Date of Encounter: 11/25/2019  Primary Cardiologist: Carlyle Dolly, MD   Subjective   Feeling good this morning. No complaints of chest pain, SOB, or palpitations.   Inpatient Medications    Scheduled Meds: . sodium chloride   Intravenous Once  . sodium chloride   Intravenous Once  . amitriptyline  20 mg Oral QHS  . apixaban  5 mg Oral BID  . Chlorhexidine Gluconate Cloth  6 each Topical Daily  . clopidogrel  75 mg Oral Daily  . colchicine  0.6 mg Oral Daily  . diltiazem  300 mg Oral Daily  . docusate  100 mg Oral Daily  . ferrous sulfate  325 mg Oral Q breakfast  . insulin aspart  0-5 Units Subcutaneous QHS  . insulin aspart  0-9 Units Subcutaneous TID WC  . insulin glargine  10 Units Subcutaneous QHS  . levothyroxine  100 mcg Oral Q breakfast  . metoprolol tartrate  100 mg Oral q morning - 10a  . multivitamin with minerals  1 tablet Oral Daily  . oxybutynin  5 mg Oral QHS  . pantoprazole  40 mg Oral QHS  . polyethylene glycol  17 g Oral Daily  . pravastatin  10 mg Oral Q supper  . tobramycin  2 drop Both Eyes Q4H while awake   Continuous Infusions: . sodium chloride Stopped (11/20/19 1949)  . amiodarone 30 mg/hr (11/25/19 0127)  . ceFEPime (MAXIPIME) IV 2 g (11/24/19 2236)  . magnesium sulfate bolus IVPB     PRN Meds: acetaminophen **OR** acetaminophen, ALPRAZolam, alum & mag hydroxide-simeth, guaiFENesin-dextromethorphan, hydrALAZINE, labetalol, liver oil-zinc oxide, magnesium sulfate bolus IVPB, metoprolol tartrate, morphine injection, ondansetron, oxyCODONE-acetaminophen, phenol, potassium chloride   Vital Signs    Vitals:   11/24/19 1915 11/24/19 2105 11/25/19 0009 11/25/19 0400  BP:  133/72 125/71 129/66  Pulse: (!) 39   68  Resp:  16 20 18   Temp:  98 F (36.7 C) 98.1 F (36.7 C) 97.6 F (36.4 C)  TempSrc:   Oral Oral  SpO2: 100%  98% 99%  Weight:    68.5 kg  Height:        Intake/Output Summary  (Last 24 hours) at 11/25/2019 0711 Last data filed at 11/25/2019 0400 Gross per 24 hour  Intake 673.05 ml  Output 1000 ml  Net -326.95 ml   Filed Weights   11/23/19 0431 11/24/19 0414 11/25/19 0400  Weight: 75.5 kg 75.6 kg 68.5 kg    Telemetry    Patient converted from atrial flutter with 2:1 block (HR in the 130s) to sinus rhythm yesterday afternoon. HR stable in the 70s-80s - Personally Reviewed  ECG    No new tracings - Personally Reviewed  Physical Exam   GEN: No acute distress.   Neck: No JVD, no carotid bruits Cardiac: RRR, no murmurs, rubs, or gallops.  Respiratory: Clear to auscultation bilaterally, no wheezes/ rales/ rhonchi GI: NABS, Soft, nontender, non-distended  MS: No edema; s/p bilateral BKA - left BKA incision site is C/D/I. Neuro:  Nonfocal, moving all extremities spontaneously Psych: Normal affect   Labs    Chemistry Recent Labs  Lab 11/19/19 1822 11/19/19 1822 11/21/19 0247 11/21/19 0247 11/23/19 0300 11/24/19 0254 11/25/19 0351  NA 131*   < > 130*   < > 136 138 136  K 3.8   < > 3.6   < > 3.9 3.9 3.7  CL 97*   < > 96*   < >  103 105 105  CO2 22   < > 22   < > 22 21* 21*  GLUCOSE 279*   < > 213*   < > 226* 198* 166*  BUN 17   < > 15   < > 18 18 16   CREATININE 1.15   < > 0.95   < > 0.97 0.99 0.96  CALCIUM 8.3*   < > 8.1*   < > 8.3* 8.7* 8.5*  PROT 6.7  --   --   --  6.5  --   --   ALBUMIN 2.2*  --   --   --  2.0*  --   --   AST 52*  --   --   --  36  --   --   ALT 28  --   --   --  28  --   --   ALKPHOS 145*  --   --   --  147*  --   --   BILITOT 0.4  --   --   --  0.4  --   --   GFRNONAA 58*   < > >60   < > >60 >60 >60  GFRAA >60   < > >60  --  >60 >60  --   ANIONGAP 12   < > 12   < > 11 12 10    < > = values in this interval not displayed.     Hematology Recent Labs  Lab 11/23/19 0300 11/24/19 0254 11/25/19 0351  WBC 10.3 13.5* 9.6  RBC 3.34* 3.77* 3.25*  HGB 8.1* 9.4* 8.2*  HCT 27.2* 30.9* 26.6*  MCV 81.4 82.0 81.8  MCH 24.3*  24.9* 25.2*  MCHC 29.8* 30.4 30.8  RDW 16.3* 16.3* 16.0*  PLT 705* 886* 847*    Cardiac EnzymesNo results for input(s): TROPONINI in the last 168 hours. No results for input(s): TROPIPOC in the last 168 hours.   BNPNo results for input(s): BNP, PROBNP in the last 168 hours.   DDimer No results for input(s): DDIMER in the last 168 hours.   Radiology    No results found.  Cardiac Studies   Echo 11/06/19 1. Left ventricular ejection fraction, by estimation, is 60 to 65%. The  left ventricle has normal function. Left ventricular endocardial border  not optimally defined to evaluate regional wall motion. There is mild left  ventricular hypertrophy. Left  ventricular diastolic parameters are indeterminate.  2. Right ventricular systolic function is normal. The right ventricular  size is normal.  3. Left atrial size was mildly dilated.  4. The pericardial effusion is circumferential.  5. The mitral valve is normal in structure. Trivial mitral valve  regurgitation. No evidence of mitral stenosis.  6. The aortic valve was not well visualized. Aortic valve regurgitation  is not visualized. No aortic stenosis is present.   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 Hewas admitted9/6/21 with sepsis and left heal pressure ulcer with underlying calcaneal osteomyelitis, also found to have worsening anemia requiring transfusion. Cardiology following for atrial fibrillation. He was on DAPT prior to admission.  Assessment & Plan    1. Paroxysmal atrial fibrillation/flutter with RVR: recurrent episodes this admission, maintained sinus rhythm when last seen by Dr. Lovena Le 11/15/19. Management complicated by hematuria with brief hold in eliquis 10/1-10/2. Hgb stable at 9.4 today. Cardiology asked to  see again 11/22/19 for Afib with RVR. HR  generally stable in the 90s-110s, occasionally up to 130s though yesterday converted to atrial flutter with 2:1 block with HR in the 130s, for which he was started on IV amiodarone. HR improved to 70s-80s.  - Continue amiodarone for rhythm control - will convert to po 200mg  BID - can continue for 2 weeks, then lower dose to 200mg  daily.  - Continue diltiazem and metoprolol for rate control - Continue apixaban for stroke ppx  2. L calcaneal osteomyelitis s/p L BKA 11/11/19: S/p course of IV antibiotics. Infection and post-op status likely contributing to #1 - Continue management per primary team.   3. UTI: continued on IV cefepime. Likely contributing to #1.  - Continue management per primary team  4. PAD: s/p bilateral BKA - Continue plavix and statin  Patient is scheduled to see Katina Dung, NP 12/08/19 for follow-up.   For questions or updates, please contact [place] Please consult [email or web address] for contact info under Cardiology/STEMI.      Signed, Abigail Butts, PA-C  11/25/2019, 7:11 AM   [phone]  Agree with assessment and plan by Roby Lofts PA-C  Patient converted to sinus rhythm.  He is on amiodarone p.o. loading.  He is also on Eliquis.  Follow-up has been arranged.  We will sign off today but be available if further questions arise.  Lorretta Harp, M.D., [place], [facility], [place] [address] [place], [place]  [id number]  [phone] 11/25/2019 11:12 AM

## 2019-11-25 NOTE — TOC Progression Note (Signed)
Transition of Care [facility]) - Progression Note    Patient Details  Name: Anthony Dunlap MRN: [id number] Date of Birth: 1933/07/23  Transition of Care [facility]) CM/SW Contact  Graves-Bigelow, Ocie Cornfield, RN Phone Number: 11/25/2019, 3:20 PM  Clinical Narrative:  Case Manager contacted [facility]- spoke to new liaison Helene Kelp. Office will be able to service the patient for RN, PT, OT, Aide. Plan for transition home on 11-26-19.    Expected Discharge Plan: [place] Barriers to Discharge: Continued Medical Work up  Expected Discharge Plan and Services Expected Discharge Plan: [place] In-house Referral: NA Discharge Planning Services: CM Consult Post Acute Care Choice: [place] arrangements for the past 2 months: Single Family Home Expected Discharge Date: 11/13/19               DME Arranged: N/A DME Agency: NA       HH Arranged: RN, PT, Nurse's Aide, Disease Management, OT HH Agency: [facility] (formerly [employer]) Date [place]: 11/09/19 Time [place]: 8 Representative spoke with at [place]: Joen Laura   Readmission Risk Interventions Readmission Risk Prevention Plan 11/13/2019 09/29/2019 05/09/2018  Transportation Screening Complete Complete Complete  PCP or Specialist Appt within 5-7 Days - Not Complete -  Not Complete comments - plan for SNF -  PCP or Specialist Appt within 3-5 Days Complete - Complete  Home Care Screening - Complete -  Medication Review (RN CM) - Referral to [place] or [place] Complete - Complete  Social Work Consult for [place] Planning/Counseling Complete - Complete  Palliative Care Screening Not Applicable - Not Applicable  Medication Review Press photographer) Complete - Complete  Some recent data might be hidden

## 2019-11-25 NOTE — Progress Notes (Signed)
PROGRESS NOTE    Anthony Dunlap  WPY:[id number] DOB: [birth year] DOA: 11/05/2019 PCP: [name], MD (Confirm with patient/family/NH records and if not entered, this HAS to be entered at [facility] point of entry. "No PCP" if truly none.)   Brief Narrative: (Start on day 1 of progress note - keep it brief and live)  Patient is an 86 year old male with history of peripheral arterial disease, prostate cancer, chronic anemia and hypothyroidism admitted for new onset atrial fibrillation with RVR and left heel ulcer. Patient is s/p BKA and atrial fibrillation is also controlled Cardizem and metoprolol. Patient also has a history of anemia of chronic disease and hemoglobin is stable at this time.  Patient went into atrial flutter with rate of 140 yesterday cardiology started the patient on IV amiodarone and recommended Lopressor 100 mg daily.  Today the heart rate stayed below 100 and amiodarone is changed to p.o. amiodarone 200 mg daily by cardiology.  Patient will be most probably discharge home tomorrow.  Assessment & Plan:   Principal Problem:   Acute osteomyelitis of left calcaneus ([name]) Active Problems:   HLD (hyperlipidemia)   Chronic anemia   Hypothyroidism   Pressure ulcer   Sepsis ([name])   Atrial fibrillation with RVR ([name])   Typical atrial flutter ([name])  Atrial flutter Patient is on continuous telemetry monitoring. Patient was in atrial flutter yesterday with a heart rate between 130-140.  Cardiology evaluated the patient and recommended IV amiodarone and metoprolol 100 mg daily.  Patient is in sinus rhythm at this time with heart rate between 80 to 100 bpm.  Cardiology changed amiodarone to oral amiodarone 200 mg p.o. twice daily 2 weeks and then lower to 100 mg daily.  Cardiology also recommended to continue home diltiazem and metoprolol for rate control.  Continue Eliquis for stroke prophylaxis.  Left heel pressure ulcer with underlying calcaneal osteomyelitis s/p  BKA  Patient  presented with left heel pressure ulcer calcaneal osteomyelitis at the time of admission. Patient is s/p BKA. Patient completed a course of daptomycin and Unasyn. No infection present at the stump site. Staples will be removed in 3 to 4 weeks. PT/OT recommended SNF placement.  Sepsis secondary to above present on admission, resolved See above  Atrial fibrillation with RVR Patient is on Eliquis and Cardizem IR and he has an sinus rhythm. IV Lopressor as needed if patient go back into A. fib with RVR.  Continue amiodarone, diltiazem and metoprolol.  Urinary tract infection Patient completed her course with cefepime.  Denies fever, chills, burning micturition, dysuria and suprapubic pain.  Gross hematuria, resolved Hematuria has completely resolved. Eliquis and Plavix were ordered because of acute gross hematuria. Eliquis and Plavix restarted by cardiology and hemoglobin is stable. No more hematuria.  Diabetes mellitus type 2 Continue Lantus 10 units daily and sliding scale insulin. Blood glucose monitoring and hypoglycemic protocol in place  Anxiety/depression Continue amitriptyline. No signs and symptoms of anxiety and depression.   Gout Continue colchicine as needed  Peripheral vascular disease status post bilateral BKA Continue Plavix.  History of prostate cancer Patient has a permanent Foley catheter. 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 Supportive management       DVT prophylaxis: Eliquis Code Status: Full code Family Communication: No family member present at the bedside Disposition Plan: PT/OT recommended SNF placement but patient's daughter want him to discharge to home with home health services.   Consultants:  Cardiology  Urology  Infectious disease  Vascular surgery  Procedures: (Don't include imaging studies which can be auto populated. Include things that  cannot be auto populated i.e. Echo, Carotid and venous dopplers, Foley, Bipap, HD, tubes/drains, wound vac, central lines etc)  Left BKA  Echocardiogram  EGD  Antimicrobials: (specify start and planned stop date. Auto populated tables are space occupying and do not give end dates)  Daptomycin, cefepime   Subjective: Patient is seen and evaluated at the bedside this morning.  Patient is complaining of mild dizziness and states that he is feeling much better.  Patient denies fever, chills, chest pain, palpitations, nausea, vomiting, diarrhea, abdominal pain, dysuria, hematuria and burning micturition.  Objective: Vitals:   11/25/19 0009 11/25/19 0400 11/25/19 0737 11/25/19 1130  BP: 125/71 129/66 128/75 137/79  Pulse:  68 83 90  Resp: 20 18 18 16   Temp: 98.1 F (36.7 C) 97.6 F (36.4 C) 97.9 F (36.6 C) (!) 97.5 F (36.4 C)  TempSrc: Oral Oral Oral Oral  SpO2: 98% 99% 98% 96%  Weight:  68.5 kg    Height:        Intake/Output Summary (Last 24 hours) at 11/25/2019 1522 Last data filed at 11/25/2019 0930 Gross per 24 hour  Intake 913.05 ml  Output 1000 ml  Net -86.95 ml   Filed Weights   11/23/19 0431 11/24/19 0414 11/25/19 0400  Weight: 75.5 kg 75.6 kg 68.5 kg    Examination:  General exam: Appears calm and comfortable  Respiratory system: Clear to auscultation. Respiratory effort normal. Cardiovascular system: Irregularly irregular rhythm with tachycardia.  No JVD, murmurs, rubs, gallops or clicks. No pedal edema. Gastrointestinal system: Abdomen is nondistended, soft and nontender. No organomegaly or masses felt. Normal bowel sounds heard. Central nervous system: Alert and oriented. No focal neurological deficits. Extremities: Bilateral BKA.  Mild erythema at the staple line on left side that is improved as compared to yesterday.  No tenderness or signs of infection. Skin: No rashes, lesions or ulcers Psychiatry: Judgement and insight appear normal. Mood & affect  appropriate.      Data Reviewed: I have personally reviewed following labs and imaging studies  CBC: Recent Labs  Lab 11/20/19 0348 11/20/19 1217 11/21/19 0247 11/21/19 1035 11/21/19 1831 11/22/19 0247 11/23/19 0300 11/24/19 0254 11/25/19 0351  WBC 13.5*  --  13.6*  --   --   --  10.3 13.5* 9.6  HGB 8.2*   < > 7.5*   < > 8.1* 7.8* 8.1* 9.4* 8.2*  HCT 27.0*   < > 25.2*   < > 27.0* 25.8* 27.2* 30.9* 26.6*  MCV 80.8  --  81.6  --   --   --  81.4 82.0 81.8  PLT 625*  --  563*  --   --   --  705* 886* 847*   < > = values in this interval not displayed.   Basic Metabolic Panel: Recent Labs  Lab 11/19/19 1822 11/21/19 0247 11/23/19 0300 11/24/19 0254 11/25/19 0351  NA 131* 130* 136 138 136  K 3.8 3.6 3.9 3.9 3.7  CL 97* 96* 103 105 105  CO2 22 22 22  21* 21*  GLUCOSE 279* 213* 226* 198* 166*  BUN 17 15 18 18 16   CREATININE 1.15 0.95 0.97 0.99 0.96  CALCIUM 8.3* 8.1* 8.3* 8.7* 8.5*   GFR: Estimated Creatinine Clearance: 55.4 mL/min (by C-G formula based on SCr of 0.96 mg/dL). Liver Function Tests: Recent Labs  Lab 11/19/19 1822 11/23/19  0300  AST 52* 36  ALT 28 28  ALKPHOS 145* 147*  BILITOT 0.4 0.4  PROT 6.7 6.5  ALBUMIN 2.2* 2.0*   No results for input(s): LIPASE, AMYLASE in the last 168 hours. No results for input(s): AMMONIA in the last 168 hours. Coagulation Profile: No results for input(s): INR, PROTIME in the last 168 hours. Cardiac Enzymes: No results for input(s): CKTOTAL, CKMB, CKMBINDEX, TROPONINI in the last 168 hours. BNP (last 3 results) No results for input(s): PROBNP in the last 8760 hours. HbA1C: No results for input(s): HGBA1C in the last 72 hours. CBG: Recent Labs  Lab 11/24/19 1720 11/24/19 2104 11/24/19 2148 11/25/19 0804 11/25/19 1127  GLUCAP 181* 242* 248* 212* 222*   Lipid Profile: No results for input(s): CHOL, HDL, LDLCALC, TRIG, CHOLHDL, LDLDIRECT in the last 72 hours. Thyroid Function Tests: No results for input(s):  TSH, T4TOTAL, FREET4, T3FREE, THYROIDAB in the last 72 hours. Anemia Panel: No results for input(s): VITAMINB12, FOLATE, FERRITIN, TIBC, IRON, RETICCTPCT in the last 72 hours. Sepsis Labs: Recent Labs  Lab 11/19/19 1822 11/20/19 0348  PROCALCITON 0.18 0.25    Recent Results (from the past 240 hour(s))  Culture, blood (routine x 2)     Status: None   Collection Time: 11/19/19  5:58 PM   Specimen: BLOOD LEFT HAND  Result Value Ref Range Status   Specimen Description BLOOD LEFT HAND  Final   Special Requests   Final    BOTTLES DRAWN AEROBIC AND ANAEROBIC Blood Culture adequate volume   Culture   Final    NO GROWTH 5 DAYS Performed at [facility], [place] [address] [place], [place] [address]    Report Status 11/24/2019 FINAL  Final  Culture, blood (routine x 2)     Status: None   Collection Time: 11/19/19  5:58 PM   Specimen: BLOOD RIGHT HAND  Result Value Ref Range Status   Specimen Description BLOOD RIGHT HAND  Final   Special Requests   Final    BOTTLES DRAWN AEROBIC AND ANAEROBIC Blood Culture adequate volume   Culture   Final    NO GROWTH 5 DAYS Performed at [facility], [place] [address] [place], [place] [address]    Report Status 11/24/2019 FINAL  Final  Culture, Urine     Status: Abnormal (Preliminary result)   Collection Time: 11/20/19 10:07 AM   Specimen: Urine, Catheterized  Result Value Ref Range Status   Specimen Description URINE, CATHETERIZED  Final   Special Requests NONE  Final   Culture (A)  Final    >=100,000 COLONIES/mL PSEUDOMONAS AERUGINOSA >=100,000 COLONIES/mL YEAST Sent to [place] for further susceptibility testing. Performed at [facility], [place] [address] [place], [place] [address]    Report Status PENDING  Incomplete         Radiology Studies: No results found.      Scheduled Meds: . sodium chloride   Intravenous Once  . sodium chloride   Intravenous Once  . amiodarone  200 mg Oral BID  . [START ON 12/09/2019]  amiodarone  200 mg Oral Daily  . amitriptyline  20 mg Oral QHS  . apixaban  5 mg Oral BID  . Chlorhexidine Gluconate Cloth  6 each Topical Daily  . clopidogrel  75 mg Oral Daily  . colchicine  0.6 mg Oral Daily  . diltiazem  300 mg Oral Daily  . docusate  100 mg Oral Daily  . ferrous sulfate  325 mg Oral  Q breakfast  . insulin aspart  0-5 Units Subcutaneous QHS  . insulin aspart  0-9 Units Subcutaneous TID WC  . insulin glargine  10 Units Subcutaneous QHS  . levothyroxine  100 mcg Oral Q breakfast  . metoprolol succinate  100 mg Oral Daily  . multivitamin with minerals  1 tablet Oral Daily  . oxybutynin  5 mg Oral QHS  . pantoprazole  40 mg Oral QHS  . polyethylene glycol  17 g Oral Daily  . pravastatin  10 mg Oral Q supper  . tobramycin  2 drop Both Eyes Q4H while awake   Continuous Infusions: . sodium chloride Stopped (11/20/19 1949)  . ceFEPime (MAXIPIME) IV Stopped (11/25/19 1030)  . magnesium sulfate bolus IVPB       LOS: 20 days    Time spent:     Edmonia Lynch, MD [facility] Pager [phone]  If 7PM-7AM, please contact night-coverage [email or web address] Password  11/25/2019, 3:22 PM

## 2019-11-25 NOTE — Progress Notes (Signed)
Occupational Therapy Treatment Patient Details Name: Anthony Dunlap MRN: [id number] DOB: 12-Sep-1933 Today's Date: 11/25/2019    History of present illness Pt is an 86 y.o. male admitted from [facility] on 11/[facility]/19 with sepsis, nonhealing L foot ulcer, afib with RVR. 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  Post L BKA 9/22.   OT comments  OT treatment session with focus on BUE therapeutic exercise, bed mobility, and functional transfers with patient requiring +2 assist for rolling R<>L and for AP transfer to recliner. Noted tremors in BUE L>R and decreased attention to task requiring multimodal cues to complete transfer. Patient also with difficulty sequencing tasks despite maximal cueing. Patient would benefit from continued acute OT services to maximize safety/independenece and decrease caregiver burden.    Follow Up Recommendations  SNF    Equipment Recommendations  Other (comment) (Defer to next level of care)    Recommendations for Other Services      Precautions / Restrictions Precautions Precautions: Fall Precaution Comments: R BKA, 9/22 L BKA Restrictions Weight Bearing Restrictions: Yes RLE Weight Bearing: Non weight bearing LLE Weight Bearing: Non weight bearing Other Position/Activity Restrictions: pt unable to maintain NWB; PT entered room with pt pushind down through residual limbs, increased education given and pt stated he understand but continued tto break NWB precautions       Mobility Bed Mobility Overal bed mobility: Needs Assistance Bed Mobility: Supine to Sit;Rolling Rolling: Mod assist;+2 for safety/equipment   Supine to sit: Mod assist;HOB elevated     General bed mobility comments: Mod A to maintain long sitting with patient pushing posteriorly. Difficulty sequencing AP transfer to recliner.   Transfers Overall transfer level: Needs  assistance   Transfers: Lateral/Scoot Transfers       Anterior-Posterior transfers: Max assist;Total assist;+2 physical assistance  Lateral/Scoot Transfers: Max assist;+2 physical assistance General transfer comment: Max A +2 for AP transfer to recliner. Patient with difficulty following 1-step multimodal commands to sequence task.     Balance     Sitting balance-Leahy Scale: Poor                                     ADL either performed or assessed with clinical judgement   ADL Overall ADL's : Needs assistance/impaired     Grooming: Wash/dry hands;Wash/dry face;Minimal assistance;Sitting Grooming Details (indicate cue type and reason): Patient able to wash face seated in recliner with Min A.                                      Vision       Perception     Praxis      Cognition Arousal/Alertness: Awake/alert Behavior During Therapy: Impulsive   Area of Impairment: Orientation;Attention;Memory;Following commands;Safety/judgement;Awareness;Problem solving                 Orientation Level: Disoriented to;Situation;Time Current Attention Level: Selective Memory: Decreased short-term memory Following Commands: Follows one step commands inconsistently Safety/Judgement: Decreased awareness of safety;Decreased awareness of deficits Awareness: Emergent Problem Solving: Difficulty sequencing;Requires verbal cues;Requires tactile cues;Decreased initiation          Exercises General Exercises - Upper Extremity Shoulder Flexion: AROM;10 reps;Seated Elbow Flexion: AROM;10 reps;Seated Elbow Extension: AROM;10 reps;Seated   Shoulder Instructions  General Comments Patiet with decreased attention to task requiring multimodal cues to sequence transfers.     Pertinent Vitals/ Pain       Pain Assessment: No/denies pain  Home Living                                          Prior Functioning/Environment               Frequency  Min 2X/week        Progress Toward Goals  OT Goals(current goals can now be found in the care plan section)  Progress towards OT goals: Progressing toward goals  Acute Rehab OT Goals Patient Stated Goal: None stated OT Goal Formulation: With patient Time For Goal Achievement: 11/21/19 Potential to Achieve Goals: Good ADL Goals Pt Will Perform Grooming: with supervision;sitting Pt Will Perform Upper Body Dressing: with supervision;sitting Pt Will Perform Lower Body Dressing: with mod assist;sitting/lateral leans Pt Will Transfer to Toilet: with mod assist;with transfer board;bedside commode Pt Will Perform Toileting - Clothing Manipulation and hygiene: with mod assist;sitting/lateral leans Additional ADL Goal #1: Pt will participate in wide range reaching and ADL at EOB without LOB.  Plan Discharge plan remains appropriate    Co-evaluation                 AM-PAC OT "6 Clicks" Daily Activity     Outcome Measure   Help from another person eating meals?: A Lot Help from another person taking care of personal grooming?: A Lot Help from another person toileting, which includes using toliet, bedpan, or urinal?: Total Help from another person bathing (including washing, rinsing, drying)?: A Lot Help from another person to put on and taking off regular upper body clothing?: A Lot Help from another person to put on and taking off regular lower body clothing?: Total 6 Click Score: 10    End of Session    OT Visit Diagnosis: Muscle weakness (generalized) (M62.81);Pain;Other symptoms and signs involving cognitive function   Activity Tolerance     Patient Left     Nurse Communication          Time: 3295-1884 OT Time Calculation (min): 23 min  Charges: OT General Charges $OT Visit: 1 Visit OT Treatments $Therapeutic Activity: 8-22 mins $Therapeutic Exercise: 8-22 mins  Tunis Gentle H. OTR/L Supplemental OT, Department of rehab services  [phone]   Maycie Luera R H. 11/25/2019, 2:14 PM

## 2019-11-26 LAB — GLUCOSE, CAPILLARY
Glucose-Capillary: 187 mg/dL — ABNORMAL HIGH (ref 70–99)
Glucose-Capillary: 238 mg/dL — ABNORMAL HIGH (ref 70–99)
Glucose-Capillary: 216 mg/dL — ABNORMAL HIGH (ref 70–99)

## 2019-11-26 MED ORDER — METOPROLOL SUCCINATE ER 100 MG PO TB24: 100.0000 mg | ORAL_TABLET | Freq: Every day | ORAL | 0 refills | Status: AC

## 2019-11-26 MED ORDER — AMIODARONE HCL 200 MG PO TABS: 200.0000 mg | ORAL_TABLET | Freq: Two times a day (BID) | ORAL | 0 refills | Status: DC

## 2019-11-26 MED ORDER — DILTIAZEM HCL ER COATED BEADS 300 MG PO CP24
300.0000 mg | ORAL_CAPSULE | Freq: Every day | ORAL | 0 refills | Status: DC
Start: 2019-11-27 — End: 2019-12-16

## 2019-11-26 NOTE — Discharge Summary (Signed)
Physician Discharge Summary  Anthony Dunlap NTI:[id number] DOB: 12/17/1933 DOA: 11/05/2019  PCP: [name], MD  Admit date: 11/05/2019 Discharge date: 11/26/2019  Admitted From: snf Disposition:  home  Recommendations for Outpatient Follow-up:  1. Follow up with PCP in 1-2 weeks 2. Please obtain BMP/CBC in one week 3. Please follow up on the following pending results:  Home Health:yes Equipment/Devices:yes Discharge Condition:Stable CODE STATUS: FULL Diet recommendation: Heart Healthy   Brief/Interim Summary: Patient is an 86 year old male with history of peripheral arterial disease, prostate cancer, chronic anemia and hypothyroidism admitted for new onset atrial fibrillation with RVR and left heel ulcer. 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  Patient completed antibiotic course with daptomycin and Unasyn for sepsis secondary to osteomyelitis.  Atrial fibrillation is also controlled Cardizem and metoprolol. Patient also has a history of anemia of chronic disease and hemoglobin is stable at this time.  Patient went into atrial flutter with rapid ventricular rate cardiology started the patient on IV amiodarone and recommended Lopressor 100 mg daily.  Patient is in sinus rhythm now and his IV amiodarone is converted to oral amiodarone.  Patient fell use amiodarone 200 mg twice daily for 14 days followed by 200 mg of amiodarone daily along with Lopressor.  Patient also had episodes of gross hematuria that is resolved.  PT/OT recommended SNF but patient declined and will be discharged home with home health services.  Discharge Diagnoses:  Principal Problem:   Acute osteomyelitis of left calcaneus ([name]) Active Problems:   HLD (hyperlipidemia)   Chronic anemia   Hypothyroidism   Pressure ulcer   Sepsis ([place])   Atrial fibrillation with RVR ([name])   Typical atrial flutter [facility])    Discharge Instructions  Discharge Instructions     Diet - low sodium heart healthy   Complete by: As directed    Discharge instructions   Complete by: As directed    Take all your medications as prescribed No follow-ups on file. Up with your pcp in a week and follow up with cardiology in 2 weeks.   Discharge wound care:   Complete by: As directed    Dressing change  per VVS   Increase activity slowly   Complete by: As directed    Increase activity slowly   Complete by: As directed    No wound care   Complete by: As directed      Allergies as of 11/26/2019      Reactions   Fluconazole Other (See Comments)   Drug-induced hepatitis      Medication List    STOP taking these medications   amoxicillin-clavulanate 875-125 MG tablet Commonly known as: AUGMENTIN   aspirin EC 81 MG tablet   ciprofloxacin 500 MG tablet Commonly known as: CIPRO   furosemide 20 MG tablet Commonly known as: LASIX   glimepiride 4 MG tablet Commonly known as: AMARYL   meloxicam 7.5 MG tablet Commonly known as: MOBIC   primidone 50 MG tablet Commonly known as: MYSOLINE   [place] 100-33 UNT-MCG/ML Sopn Generic drug: Insulin Glargine-Lixisenatide   sulfamethoxazole-trimethoprim 800-160 MG tablet Commonly known as: BACTRIM DS     TAKE these medications   (feeding supplement) PROSource Plus liquid Take 30 mLs by mouth 2 (two) times daily with a meal.   nutrition supplement (JUVEN) Pack Take 1 packet by mouth 2 (two) times daily between meals.   acetaminophen 500 MG tablet Commonly known as: TYLENOL Take 1,000 mg by mouth every 6 (  six) hours as needed for mild pain or moderate pain.   ALPRAZolam 0.5 MG tablet Commonly known as: XANAX Take 1 tablet (0.5 mg total) by mouth 2 (two) times daily as needed for anxiety.   amiodarone 200 MG tablet Commonly known as: PACERONE Take 1 tablet (200 mg total) by mouth 2 (two) times daily for 14 days. Amiodarone 200mg  BID for 14 days and then once daily.   amitriptyline 10 MG tablet Commonly known  as: ELAVIL Take 20 mg by mouth at bedtime.   apixaban 5 MG Tabs tablet Commonly known as: ELIQUIS Take 1 tablet (5 mg total) by mouth 2 (two) times daily.   clopidogrel 75 MG tablet Commonly known as: Plavix Take 1 tablet (75 mg total) by mouth daily.   colchicine 0.6 MG tablet Take 0.6 mg by mouth daily as needed (gout attacks).   diltiazem 240 MG 24 hr capsule Commonly known as: CARDIZEM CD Take 1 capsule (240 mg total) by mouth daily.   diltiazem 300 MG 24 hr capsule Commonly known as: CARDIZEM CD Take 1 capsule (300 mg total) by mouth daily. Start taking on: November 27, 2019   ferrous sulfate 325 (65 FE) MG tablet Take 1 tablet (325 mg total) by mouth 2 (two) times daily with a meal.   insulin aspart 100 UNIT/ML injection Commonly known as: novoLOG Inject 0-15 Units into the skin 3 (three) times daily with meals. Sliding scale insulin Less than 70 initiate hypoglycemia protocol 70-120  0 units 120-150 2 unit 151-200 3 units 201-250 3 units 251-300 5 units 301-350 8 units 351-400 11 units  Greater than 400 15 units , call MD What changed:   how much to take  when to take this  additional instructions   insulin glargine 100 UNIT/ML injection Commonly known as: LANTUS Inject 0.04 mLs (4 Units total) into the skin at bedtime.   insulin glargine 100 UNIT/ML injection Commonly known as: LANTUS Inject 0.06 mLs (6 Units total) into the skin at bedtime.   levothyroxine 100 MCG tablet Commonly known as: SYNTHROID Take 100 mcg by mouth daily with breakfast.   metoprolol succinate 50 MG 24 hr tablet Commonly known as: TOPROL-XL Take 1 tablet (50 mg total) by mouth daily. Take with or immediately following a meal.   metoprolol succinate 100 MG 24 hr tablet Commonly known as: TOPROL-XL Take 1 tablet (100 mg total) by mouth daily. Take with or immediately following a meal. Start taking on: November 27, 2019   multivitamin with minerals Tabs tablet Take 1 tablet  by mouth daily.   nystatin powder Commonly known as: MYCOSTATIN/NYSTOP Apply topically 4 (four) times daily. What changed:   how much to take  when to take this  reasons to take this   oxybutynin 5 MG 24 hr tablet Commonly known as: DITROPAN-XL Take 1 tablet (5 mg total) by mouth at bedtime.   oxyCODONE 5 MG immediate release tablet Commonly known as: Oxy IR/ROXICODONE Take 1 tablet (5 mg total) by mouth every 4 (four) hours as needed for moderate pain.   pantoprazole 40 MG tablet Commonly known as: PROTONIX Take 1 tablet (40 mg total) by mouth daily.   pravastatin 10 MG tablet Commonly known as: PRAVACHOL Take 10 mg by mouth daily with supper.   senna-docusate 8.6-50 MG tablet Commonly known as: Senokot-S Take 1 tablet by mouth 2 (two) times daily.   silver sulfADIAZINE 1 % cream Commonly known as: SILVADENE Apply topically 2 (two) times daily.   tobramycin  0.3 % ophthalmic solution Commonly known as: TOBREX Place 2 drops into both eyes every 4 (four) hours while awake.            Discharge Care Instructions  (From admission, onward)         Start     Ordered   11/13/19 0000  Discharge wound care:       Comments: Dressing change  per [facility]   11/13/19 1408          Follow-up Information    [facility] [facility] In 5 weeks.   Specialty: Vascular Surgery Why: Office will call you to arrange your appt (sent) Contact information: [address] [place] [phone]       Verta Ellen., NP Follow up.   Specialty: Cardiology Why: [place] location - we have arranged a follow-up for you with cardiology on Tuesday Dec 08, 2019 at 9:30 AM (Arrive by 9:15 AM). Katina Dung is one of the nurse practitioners with our team. Contact information: [place] [address] [phone]        [facility], [facility] Follow up.   Specialty: Home Health Services Why:  Registered Nurse, Physical Therapy, Occupational Therapy, Aide-office to call with visit times.  Contact information: [place] [address] [phone]              Allergies  Allergen Reactions  . Fluconazole Other (See Comments)    Drug-induced hepatitis    Consultations:  Staple removal in 4 weeks  Cardiology   Procedures/Studies: CT ABDOMEN PELVIS WO CONTRAST  Result Date: 11/20/2019 CLINICAL DATA:  Abdominal pain and distension. EXAM: CT ABDOMEN AND PELVIS WITHOUT CONTRAST TECHNIQUE: Multidetector CT imaging of the abdomen and pelvis was performed following the standard protocol without IV contrast. COMPARISON:  CT 2 weeks ago 11/05/2019, pelvis MRI 01/25/2019 FINDINGS: Lower chest: Improved right but slightly worsening left pleural effusion. Adjacent atelectasis. High density pericardial fluid which has slightly diminished in volume from prior exam. There are coronary artery calcifications. Motion obscures detailed assessment. Hepatobiliary: No obvious focal liver abnormality, partially obscured by motion. Motion through the gallbladder without calcified gallstone. There is no biliary dilatation. Pancreas: Parenchymal atrophy. No ductal dilatation or inflammation. Spleen: Motion limited, no gross acute abnormality. Adrenals/Urinary Tract: Mild adrenal thickening without dominant nodule. Motion through the kidneys. There is no hydronephrosis. Mild bilateral renal parenchymal atrophy. Suggestion of mild symmetric perinephric edema, motion obscured. No visualized renal calculi. Urinary bladder is decompressed by catheter. Stomach/Bowel: Similar appearance of gaseous distension and redundancy of the sigmoid colon. Large stool burden in the ascending, transverse, and proximal descending colon. Mixed liquid and solid stool within the rectum. No obvious rectal wall thickening. Administered enteric contrast reaches the distal small bowel. Inguinal canals are  patulous with small bowel approaching but not entering. No small bowel obstruction. Partially distended stomach, primarily decompressed. Vascular/Lymphatic: Aorto bi-iliac atherosclerosis. Aortic branch atherosclerosis. Small left inguinal nodes largest measuring 9 mm short axis, unchanged. Reproductive: Brachytherapy seeds in the prostate gland. Other: No free air or ascites/free fluid. Small fat containing umbilical hernia. 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 Right hip arthroplasty. There is a small left hip joint effusion. Left hip osteoarthritis. Degenerative change in the lumbar spine with primarily facet hypertrophy. Enthesopathic change involving the hamstring insertions. No acute osseous abnormalities. IMPRESSION: 1. No acute abnormality in the abdomen/pelvis. 2.  Large stool burden with gaseous distension and redundancy of the sigmoid colon, similar to prior exam. Mixed liquid and solid stool within the rectum without obvious rectal wall thickening. Overall findings consistent with constipation. There is no bowel obstruction. 3. Improved right but slightly worsening small left pleural effusion. High density pericardial fluid has slightly diminished in volume from prior exam. 4. Diminished right inguinal hernia, inguinal canal appears patulous on the current exam. No bowel involvement currently. 5. Chronic osteomyelitis of the pubic symphysis. Aortic Atherosclerosis (ICD10-I70.0). Electronically Signed   By: Keith Rake M.D.   On: 11/20/2019 23:40   DG Abd 1 View  Result Date: 11/08/2019 CLINICAL DATA:  Follow-up for abdominal distension EXAM: ABDOMEN - 1 VIEW COMPARISON:  None. FINDINGS: Gaseous distension of the small bowel and colon. Moderate amount of stool throughout the colon. No evidence of pneumoperitoneum, portal venous gas or pneumatosis. No pathologic calcifications along the expected course of the ureters. No acute  osseous abnormality. IMPRESSION: Gaseous distension of the small bowel and colon as can be seen with an ileus. Electronically Signed   By: Kathreen Devoid   On: 11/08/2019 15:47   CT Abdomen Pelvis W Contrast  Result Date: 11/05/2019 CLINICAL DATA:  Bowel obstruction, pubic symphyseal osteomyelitis EXAM: CT ABDOMEN AND PELVIS WITH CONTRAST TECHNIQUE: Multidetector CT imaging of the abdomen and pelvis was performed using the standard protocol following bolus administration of intravenous contrast. CONTRAST:  142mL OMNIPAQUE IOHEXOL 300 MG/ML  SOLN COMPARISON:  None. FINDINGS: Lower chest: Small bilateral pleural effusions are present with associated mild bibasilar compressive atelectasis. Small pericardial effusion is present, new since prior examination, with associated pericardial enhancement suggesting a complex/exudative effusion. Cardiac size is within normal limits. No CT evidence of cardiac tamponade. Extensive distal right coronary artery calcification. Hepatobiliary: No focal liver abnormality is seen. No gallstones, gallbladder wall thickening, or biliary dilatation. Pancreas: Moderate atrophy, but otherwise unremarkable. Spleen: Unremarkable Adrenals/Urinary Tract: The adrenal glands are unremarkable. The kidneys are normal in size and position. Tiny exophytic cortical cyst within the upper pole of the left kidney. The kidneys are otherwise unremarkable. The bladder is decompressed with a Foley catheter balloon seen within its lumen. Stomach/Bowel: Stomach and small bowel are unremarkable. The sigmoid colon is markedly redundant, however, there is no evidence of obstruction or focal inflammation. Moderate stool within the ascending and transverse colon. The appendix is not visualized and is likely absent. There is no free intraperitoneal gas or fluid. Vascular/Lymphatic: No pathologic adenopathy within the abdomen and pelvis. There is mild aortoiliac atherosclerotic calcification noted without evidence of  aneurysm. Particularly prominent calcification, however, is seen at the origin of the mesenteric and renal vasculature, though the degree of stenosis is not well assessed on this non arteriographic study. Reproductive: Brachytherapy seeds are seen within the prostate gland. Other: Small right inguinal hernia is present containing portions of a distal small bowel and the cecum as well as a small amount of mesenteric fat. Tiny fat containing left inguinal hernia is present. Musculoskeletal: Right total hip arthroplasty has been performed. Moderate left hip degenerative arthritis. Mixed lytic and sclerotic changes involving the pubic symphyses are compatible with the given history of symphyseal osteomyelitis. Calcifications seen at the a hamstring origins likely relates to remote trauma or inflammation. Degenerative changes are seen within the lumbar spine. No lytic or blastic bone lesions are seen. IMPRESSION: 1. Small pericardial effusion with associated pericardial enhancement suggesting a complex/exudative effusion. No CT evidence of cardiac tamponade. 2. Small bilateral pleural effusions. 3.  Small right inguinal hernia containing portions of a distal small bowel and cecum. No evidence of bowel obstruction. 4. 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 Aortic Atherosclerosis (ICD10-I70.0). Electronically Signed   By: Fidela Salisbury MD   On: 11/05/2019 15:44   MR FOOT LEFT W WO CONTRAST  Result Date: 11/08/2019 CLINICAL DATA:  Osteomyelitis of the foot. Plain foot soft tissue wound EXAM: MRI OF THE LEFT FOREFOOT WITHOUT AND WITH CONTRAST TECHNIQUE: Multiplanar, multisequence MR imaging of the left foot was performed both before and after administration of intravenous contrast. CONTRAST:  14mL GADAVIST GADOBUTROL 1 MMOL/ML IV SOLN COMPARISON:  None. FINDINGS: TENDONS Peroneal: Peroneal longus tendon intact. Peroneal brevis intact.  Posteromedial: Posterior tibial tendon intact. Flexor hallucis longus tendon intact. Flexor digitorum longus tendon intact. Anterior: Tibialis anterior tendon intact. Extensor hallucis longus tendon intact Extensor digitorum longus tendon intact. Achilles:  Mild tendinosis of the distal Achilles tendon. Plantar Fascia: Thickening of the medial band of the plantar fascia at the calcaneal insertion as can be seen with mild plantar fasciitis. LIGAMENTS Lateral: Anterior talofibular ligament intact. Calcaneofibular ligament intact. Posterior talofibular ligament intact. Anterior and posterior tibiofibular ligaments intact. Medial: Deltoid ligament intact. Spring ligament intact. CARTILAGE Ankle Joint: No joint effusion. Normal ankle mortise. No chondral defect. Subtalar Joints/Sinus Tarsi: Normal subtalar joints. No subtalar joint effusion. Normal sinus tarsi. Bones/Soft Tissue: Soft tissue wound along the posterolateral aspect of the hindfoot overlying the posterior calcaneus. Underlying cortical destruction of the posterolateral calcaneus just inferior to the Achilles insertion with severe surrounding marrow edema most consistent with osteomyelitis. No drainable fluid collection. Soft tissue edema throughout the hindfoot extending into the ankle concerning for cellulitis. T2 hyperintense signal throughout the plantar musculature likely neurogenic. IMPRESSION: 1. Soft tissue wound along the posterolateral aspect of the hindfoot overlying the posterior calcaneus. Underlying cortical destruction of the posterolateral calcaneus just inferior to the Achilles insertion with severe surrounding marrow edema most consistent with osteomyelitis with surrounding cellulitis. No drainable fluid collection. 2. Mild tendinosis of the distal Achilles tendon. 3. Thickening of the medial band of the plantar fascia at the calcaneal insertion as can be seen with mild plantar fasciitis. Electronically Signed   By: Kathreen Devoid   On:  11/08/2019 11:46   DG Chest Port 1 View  Result Date: 11/19/2019 CLINICAL DATA:  Fever. EXAM: PORTABLE CHEST 1 VIEW COMPARISON:  11/05/2019 FINDINGS: Borderline cardiomegaly. Unchanged mediastinal contours. Mild chronic elevation of left hemidiaphragm with adjacent atelectasis. Confluent consolidation. No pulmonary edema. No pneumothorax. No significant pleural effusion. Stable osseous structures with chronic degenerative change of the left shoulder. IMPRESSION: 1. No evidence of pneumonia.  Borderline cardiomegaly. 2. Chronic elevation of left hemidiaphragm with adjacent atelectasis. Electronically Signed   By: Keith Rake M.D.   On: 11/19/2019 17:53   DG Chest Port 1 View  Result Date: 11/05/2019 CLINICAL DATA:  New onset AFib EXAM: PORTABLE CHEST 1 VIEW COMPARISON:  June 09, 2019 FINDINGS: The cardiomediastinal silhouette is unchanged in contour when accounting for differences in technique.Trace fluid in the RIGHT minor fissure. No pleural effusion. No pneumothorax. No acute pleuroparenchymal abnormality. Gaseous distension of the colon. Multilevel degenerative changes of the thoracic spine. IMPRESSION: No acute cardiopulmonary abnormality. Electronically Signed   By: Valentino Saxon MD   On: 11/05/2019 12:26   ECHOCARDIOGRAM COMPLETE  Result Date: 11/06/2019    ECHOCARDIOGRAM REPORT   Patient Name:   Garek D Nehme Date of Exam: 11/06/2019  Medical Rec #:  [id number]      Height:       68.0 in Accession #:    [id number]     Weight:       170.0 lb Date of Birth:  1933/07/11      BSA:          1.907 m Patient Age:    55 years       BP:           103/75 mmHg Patient Gender: M              HR:           78 bpm. Exam Location:  [facility] Procedure: 2D Echo, Cardiac Doppler and Color Doppler Indications:    Prostate Cancer  History:        Patient has prior history of Echocardiogram examinations, most                 recent 01/23/2018. Risk Factors:Dyslipidemia and Diabetes.                 Prostate  Cancer.  Sonographer:    Alvino Chapel RCS Referring Phys: [id number] DAWOOD S [employer]  Sonographer Comments: Technically difficult study due to poor echo windows. IMPRESSIONS  1. Left ventricular ejection fraction, by estimation, is 60 to 65%. The left ventricle has normal function. Left ventricular endocardial border not optimally defined to evaluate regional wall motion. There is mild left ventricular hypertrophy. Left ventricular diastolic parameters are indeterminate.  2. Right ventricular systolic function is normal. The right ventricular size is normal.  3. Left atrial size was mildly dilated.  4. The pericardial effusion is circumferential.  5. The mitral valve is normal in structure. Trivial mitral valve regurgitation. No evidence of mitral stenosis.  6. The aortic valve was not well visualized. Aortic valve regurgitation is not visualized. No aortic stenosis is present. FINDINGS  Left Ventricle: Left ventricular ejection fraction, by estimation, is 60 to 65%. The left ventricle has normal function. Left ventricular endocardial border not optimally defined to evaluate regional wall motion. The left ventricular internal cavity size was normal in size. There is mild left ventricular hypertrophy. Left ventricular diastolic parameters are indeterminate. Right Ventricle: The right ventricular size is normal. No increase in right ventricular wall thickness. Right ventricular systolic function is normal. Left Atrium: Left atrial size was mildly dilated. Right Atrium: Right atrial size was normal in size. Pericardium: Trivial pericardial effusion is present. The pericardial effusion is circumferential. Mitral Valve: The mitral valve is normal in structure. Trivial mitral valve regurgitation. No evidence of mitral valve stenosis. Tricuspid Valve: The tricuspid valve is normal in structure. Tricuspid valve regurgitation is not demonstrated. No evidence of tricuspid stenosis. Aortic Valve: The aortic valve was not well  visualized. Aortic valve regurgitation is not visualized. No aortic stenosis is present. Aortic valve mean gradient measures 3.3 mmHg. Aortic valve peak gradient measures 7.2 mmHg. Aortic valve area, by VTI measures 2.80 cm. Pulmonic Valve: The pulmonic valve was not well visualized. Pulmonic valve regurgitation is not visualized. No evidence of pulmonic stenosis. Aorta: The aortic root is normal in size and structure. Pulmonary Artery: Indeterminant PASP, inadequate TR jet. Venous: The inferior vena cava was not well visualized. IAS/Shunts: The interatrial septum was not well visualized.  LEFT VENTRICLE PLAX 2D LVIDd:         4.18 cm LVIDs:         2.44 cm LV PW:  1.28 cm LV IVS:        1.12 cm LVOT diam:     2.00 cm LV SV:         56 LV SV Index:   29 LVOT Area:     3.14 cm  RIGHT VENTRICLE RV S prime:     13.00 cm/s TAPSE (M-mode): 1.4 cm LEFT ATRIUM             Index       RIGHT ATRIUM           Index LA diam:        2.60 cm 1.36 cm/m  RA Area:     15.90 cm LA Vol (A2C):   57.8 ml 30.30 ml/m RA Volume:   38.20 ml  20.03 ml/m LA Vol (A4C):   58.6 ml 30.72 ml/m LA Biplane Vol: 59.0 ml 30.93 ml/m  AORTIC VALVE AV Area (Vmax):    2.36 cm AV Area (Vmean):   2.29 cm AV Area (VTI):     2.80 cm AV Vmax:           134.16 cm/s AV Vmean:          83.194 cm/s AV VTI:            0.199 m AV Peak Grad:      7.2 mmHg AV Mean Grad:      3.3 mmHg LVOT Vmax:         100.60 cm/s LVOT Vmean:        60.700 cm/s LVOT VTI:          0.178 m LVOT/AV VTI ratio: 0.89  AORTA Ao Root diam: 3.00 cm MITRAL VALVE                TRICUSPID VALVE MV Area (PHT): 3.61 cm     TR Peak grad:   22.3 mmHg MV Decel Time: 210 msec     TR Vmax:        236.00 cm/s MV E velocity: 123.00 cm/s                             SHUNTS                             Systemic VTI:  0.18 m                             Systemic Diam: 2.00 cm Carlyle Dolly MD Electronically signed by Carlyle Dolly MD Signature Date/Time: 11/06/2019/11:30:42 AM    Final      (Echo, Carotid, EGD, Colonoscopy, ERCP)    Subjective: Patient is seen and evaluated at the bedside today.  Patient is very excited and happy that he is going home today.  Patient denies any complaints at this time no acute events overnight reported by the nursing staff.  Heart rate still stable between 70 to 80 bpm.  Patient denies fever, chills, chest pain, shortness of breath, nausea, vomiting, abdominal pain and urinary symptoms.   Discharge Exam: Vitals:   11/26/19 1135 11/26/19 1434  BP: 139/85   Pulse: 83 83  Resp: 16 18  Temp: 98.3 F (36.8 C) 97.9 F (36.6 C)  SpO2: 98%    Vitals:   11/26/19 0455 11/26/19 0822 11/26/19 1135 11/26/19 1434  BP:  136/88 139/85   Pulse: 73 96 83  83  Resp:  18 16 18   Temp:  97.6 F (36.4 C) 98.3 F (36.8 C) 97.9 F (36.6 C)  TempSrc:  Axillary Axillary Oral  SpO2: 100% 100% 98%   Weight: 65.9 kg     Height:        General: Pt is alert, awake, not in acute distress Cardiovascular: RRR, S1/S2 +, no rubs, no gallops Respiratory: CTA bilaterally, no wheezing, no rhonchi Abdominal: Soft, NT, ND, bowel sounds + Extremities: no edema, no cyanosis.  Bilateral BKA    The results of significant diagnostics from this hospitalization (including imaging, microbiology, ancillary and laboratory) are listed below for reference.     Microbiology: Recent Results (from the past 240 hour(s))  Culture, blood (routine x 2)     Status: None   Collection Time: 11/19/19  5:58 PM   Specimen: BLOOD LEFT HAND  Result Value Ref Range Status   Specimen Description BLOOD LEFT HAND  Final   Special Requests   Final    BOTTLES DRAWN AEROBIC AND ANAEROBIC Blood Culture adequate volume   Culture   Final    NO GROWTH 5 DAYS Performed at [facility], [address] [place], [place] [address]    Report Status 11/24/2019 FINAL  Final  Culture, blood (routine x 2)     Status: None   Collection Time: 11/19/19  5:58 PM   Specimen: BLOOD RIGHT HAND   Result Value Ref Range Status   Specimen Description BLOOD RIGHT HAND  Final   Special Requests   Final    BOTTLES DRAWN AEROBIC AND ANAEROBIC Blood Culture adequate volume   Culture   Final    NO GROWTH 5 DAYS Performed at [facility], [place] [address], [place], [place] [address]    Report Status 11/24/2019 FINAL  Final  Culture, Urine     Status: Abnormal (Preliminary result)   Collection Time: 11/20/19 10:07 AM   Specimen: Urine, Catheterized  Result Value Ref Range Status   Specimen Description URINE, CATHETERIZED  Final   Special Requests NONE  Final   Culture (A)  Final    >=100,000 COLONIES/mL PSEUDOMONAS AERUGINOSA >=100,000 COLONIES/mL YEAST Sent to [place] for further susceptibility testing. Performed at [facility], [place] [address] [place], [place] [address]    Report Status PENDING  Incomplete     Labs: BNP (last 3 results) No results for input(s): BNP in the last 8760 hours. Basic Metabolic Panel: Recent Labs  Lab 11/19/19 1822 11/21/19 0247 11/23/19 0300 11/24/19 0254 11/25/19 0351  NA 131* 130* 136 138 136  K 3.8 3.6 3.9 3.9 3.7  CL 97* 96* 103 105 105  CO2 22 22 22  21* 21*  GLUCOSE 279* 213* 226* 198* 166*  BUN 17 15 18 18 16   CREATININE 1.15 0.95 0.97 0.99 0.96  CALCIUM 8.3* 8.1* 8.3* 8.7* 8.5*   Liver Function Tests: Recent Labs  Lab 11/19/19 1822 11/23/19 0300  AST 52* 36  ALT 28 28  ALKPHOS 145* 147*  BILITOT 0.4 0.4  PROT 6.7 6.5  ALBUMIN 2.2* 2.0*   No results for input(s): LIPASE, AMYLASE in the last 168 hours. No results for input(s): AMMONIA in the last 168 hours. CBC: Recent Labs  Lab 11/20/19 0348 11/20/19 1217 11/21/19 0247 11/21/19 1035 11/21/19 1831 11/22/19 0247 11/23/19 0300 11/24/19 0254 11/25/19 0351  WBC 13.5*  --  13.6*  --   --   --  10.3 13.5* 9.6  HGB 8.2*   < >  7.5*   < > 8.1* 7.8* 8.1* 9.4* 8.2*  HCT 27.0*   < > 25.2*   < > 27.0* 25.8* 27.2* 30.9* 26.6*  MCV 80.8  --  81.6  --   --   --   81.4 82.0 81.8  PLT 625*  --  563*  --   --   --  705* 886* 847*   < > = values in this interval not displayed.   Cardiac Enzymes: No results for input(s): CKTOTAL, CKMB, CKMBINDEX, TROPONINI in the last 168 hours. BNP: Invalid input(s): POCBNP CBG: Recent Labs  Lab 11/25/19 1644 11/25/19 2227 11/26/19 0820 11/26/19 1155 11/26/19 1606  GLUCAP 249* 184* 187* 238* 216*   D-Dimer No results for input(s): DDIMER in the last 72 hours. Hgb A1c No results for input(s): HGBA1C in the last 72 hours. Lipid Profile No results for input(s): CHOL, HDL, LDLCALC, TRIG, CHOLHDL, LDLDIRECT in the last 72 hours. Thyroid function studies No results for input(s): TSH, T4TOTAL, T3FREE, THYROIDAB in the last 72 hours.  Invalid input(s): FREET3 Anemia work up No results for input(s): VITAMINB12, FOLATE, FERRITIN, TIBC, IRON, RETICCTPCT in the last 72 hours. Urinalysis    Component Value Date/Time   COLORURINE AMBER (A) 11/19/2019 2039   APPEARANCEUR TURBID (A) 11/19/2019 2039   LABSPEC 1.018 11/19/2019 2039   PHURINE 5.0 11/19/2019 2039   GLUCOSEU NEGATIVE 11/19/2019 2039   HGBUR LARGE (A) 11/19/2019 2039   BILIRUBINUR NEGATIVE 11/19/2019 2039   KETONESUR NEGATIVE 11/19/2019 2039   PROTEINUR 30 (A) 11/19/2019 2039   UROBILINOGEN 0.2 09/03/2013 0132   NITRITE NEGATIVE 11/19/2019 2039   LEUKOCYTESUR MODERATE (A) 11/19/2019 2039   Sepsis Labs Invalid input(s): PROCALCITONIN,  WBC,  LACTICIDVEN Microbiology Recent Results (from the past 240 hour(s))  Culture, blood (routine x 2)     Status: None   Collection Time: 11/19/19  5:58 PM   Specimen: BLOOD LEFT HAND  Result Value Ref Range Status   Specimen Description BLOOD LEFT HAND  Final   Special Requests   Final    BOTTLES DRAWN AEROBIC AND ANAEROBIC Blood Culture adequate volume   Culture   Final    NO GROWTH 5 DAYS Performed at [facility], [place] [address], [place], [place] [address]    Report Status 11/24/2019 FINAL  Final   Culture, blood (routine x 2)     Status: None   Collection Time: 11/19/19  5:58 PM   Specimen: BLOOD RIGHT HAND  Result Value Ref Range Status   Specimen Description BLOOD RIGHT HAND  Final   Special Requests   Final    BOTTLES DRAWN AEROBIC AND ANAEROBIC Blood Culture adequate volume   Culture   Final    NO GROWTH 5 DAYS Performed at [facility], [place] [address] [place], [place] [address]    Report Status 11/24/2019 FINAL  Final  Culture, Urine     Status: Abnormal (Preliminary result)   Collection Time: 11/20/19 10:07 AM   Specimen: Urine, Catheterized  Result Value Ref Range Status   Specimen Description URINE, CATHETERIZED  Final   Special Requests NONE  Final   Culture (A)  Final    >=100,000 COLONIES/mL PSEUDOMONAS AERUGINOSA >=100,000 COLONIES/mL YEAST Sent to [place] for further susceptibility testing. Performed at [facility], [place] [address] [place], [place] [address]    Report Status PENDING  Incomplete     Time coordinating discharge: Over 30 minutes  SIGNED:   Edmonia Lynch, MD  [facility] 11/26/2019, 6:00 PM Pager   If 7PM-7AM, please contact night-coverage [email or web address] Password [email or web address]

## 2019-11-26 NOTE — TOC Transition Note (Signed)
Transition of Care [facility]) - CM/SW Discharge Note   Patient Details  Name: Anthony Dunlap MRN: [id number] Date of Birth: 08-Aug-1933  [facility] CM/SW Contact:  [name], RN Phone Number: 11/26/2019, 12:25 PM   Clinical Narrative: Case Manager received call from [facility] stating that [facility] could not service the patient due to staffing. [name] is agreeable to accept the patient and provide services. Start of care to begin within 24-48 hours post transition home. Case Manager reached out to [name] at [phone] and she is aware of agency change. Daughter has asked that [facility] transport the patient home. Daughter vs some other family member will be home after 3:00 pm. She asked if the transportation could be set for pick up at 3:00 pm. Case Manager did make her aware that it could be after that time-daughter verbally stated that she understood. Staff RN aware of transport time. No further needs at this time.      Final next level of care: [place] Barriers to Discharge: Continued Medical Work up   Patient Goals and CMS Choice Patient states their goals for this hospitalization and ongoing recovery are:: to return home- with daughter [name]. CMS Medicare.gov Compare Post Acute Care list provided to:: Patient Choice offered to / list presented to : Adult Children  Discharge Placement                       Discharge Plan and Services In-house Referral: NA Discharge Planning Services: CM Consult Post Acute Care Choice: Home Health          DME Arranged: N/A DME Agency: NA       HH Arranged: RN, PT, Nurse's Aide, Disease Management, OT [place] Agency: [place] Date [facility] Agency Contacted: 11/26/19 Time HH Agency Contacted: 1100 Representative spoke with at [place]- stated [facility] did not have staff was given referral by [facility]  Readmission Risk Interventions Readmission Risk Prevention Plan 11/13/2019 09/29/2019  05/09/2018  Transportation Screening Complete Complete Complete  PCP or Specialist Appt within 5-7 Days - Not Complete -  Not Complete comments - plan for SNF -  PCP or Specialist Appt within 3-5 Days Complete - Complete  Home Care Screening - Complete -  Medication Review (RN CM) - Referral to [place] or Home Care Consult Complete - Complete  Social Work Consult for [place] Planning/Counseling Complete - Complete  Palliative Care Screening Not Applicable - Not Applicable  Medication Review Press photographer) Complete - Complete  Some recent data might be hidden

## 2019-11-26 NOTE — Progress Notes (Signed)
Talked with the daughter regarding discharge today.  Answered the questions she presented to me.  Seem to be ok with pt coming home today.  Requested that pt be transported via [facility].  Case manager made aware of request.  Pt says he is glad he is leaving to go home today.

## 2019-11-26 NOTE — Progress Notes (Signed)
Pt.was discharged home in fair condition via [facility] .Discharge papers sent given to [name] to give to the caregiver as per daughter's instruction.Spoke to the daughter Anthony Dunlap .

## 2019-11-29 LAB — URINE CULTURE: Culture: >=100000 — AB

## 2019-12-01 ENCOUNTER — Encounter (HOSPITAL_COMMUNITY): Payer: Self-pay | Admitting: Emergency Medicine

## 2019-12-01 ENCOUNTER — Emergency Department (HOSPITAL_COMMUNITY): Payer: Medicare Other

## 2019-12-01 ENCOUNTER — Inpatient Hospital Stay (HOSPITAL_COMMUNITY)
Admission: EM | Admit: 2019-12-01 | Discharge: 2019-12-06 | DRG: 698 | Disposition: A | Discharge status: Home / Self Care | Payer: Medicare Other | Attending: Internal Medicine | Admitting: Internal Medicine

## 2019-12-01 ENCOUNTER — Other Ambulatory Visit: Payer: Self-pay

## 2019-12-01 DIAGNOSIS — Z7902 Long term (current) use of antithrombotics/antiplatelets: Secondary | ICD-10-CM

## 2019-12-01 DIAGNOSIS — N179 Acute kidney failure, unspecified: Secondary | ICD-10-CM

## 2019-12-01 DIAGNOSIS — L89301 Pressure ulcer of unspecified buttock, stage 1: Secondary | ICD-10-CM | POA: Diagnosis present

## 2019-12-01 DIAGNOSIS — Z89512 Acquired absence of left leg below knee: Secondary | ICD-10-CM

## 2019-12-01 DIAGNOSIS — Z96641 Presence of right artificial hip joint: Secondary | ICD-10-CM | POA: Diagnosis present

## 2019-12-01 DIAGNOSIS — K219 Gastro-esophageal reflux disease without esophagitis: Secondary | ICD-10-CM | POA: Diagnosis present

## 2019-12-01 DIAGNOSIS — C61 Malignant neoplasm of prostate: Secondary | ICD-10-CM | POA: Diagnosis present

## 2019-12-01 DIAGNOSIS — Z20822 Contact with and (suspected) exposure to covid-19: Secondary | ICD-10-CM | POA: Diagnosis present

## 2019-12-01 DIAGNOSIS — R4781 Slurred speech: Secondary | ICD-10-CM

## 2019-12-01 DIAGNOSIS — E039 Hypothyroidism, unspecified: Secondary | ICD-10-CM | POA: Diagnosis present

## 2019-12-01 DIAGNOSIS — E1165 Type 2 diabetes mellitus with hyperglycemia: Secondary | ICD-10-CM | POA: Diagnosis present

## 2019-12-01 DIAGNOSIS — R531 Weakness: Secondary | ICD-10-CM | POA: Diagnosis present

## 2019-12-01 DIAGNOSIS — T83511A Infection and inflammatory reaction due to indwelling urethral catheter, initial encounter: Principal | ICD-10-CM | POA: Diagnosis present

## 2019-12-01 DIAGNOSIS — Z7901 Long term (current) use of anticoagulants: Secondary | ICD-10-CM

## 2019-12-01 DIAGNOSIS — I10 Essential (primary) hypertension: Secondary | ICD-10-CM | POA: Diagnosis present

## 2019-12-01 DIAGNOSIS — R339 Retention of urine, unspecified: Secondary | ICD-10-CM

## 2019-12-01 DIAGNOSIS — L89151 Pressure ulcer of sacral region, stage 1: Secondary | ICD-10-CM

## 2019-12-01 DIAGNOSIS — Z79899 Other long term (current) drug therapy: Secondary | ICD-10-CM

## 2019-12-01 DIAGNOSIS — R471 Dysarthria and anarthria: Secondary | ICD-10-CM | POA: Diagnosis present

## 2019-12-01 DIAGNOSIS — I480 Paroxysmal atrial fibrillation: Secondary | ICD-10-CM | POA: Diagnosis present

## 2019-12-01 DIAGNOSIS — R9431 Abnormal electrocardiogram [ECG] [EKG]: Secondary | ICD-10-CM

## 2019-12-01 DIAGNOSIS — Z794 Long term (current) use of insulin: Secondary | ICD-10-CM

## 2019-12-01 DIAGNOSIS — I63439 Cerebral infarction due to embolism of unspecified posterior cerebral artery: Secondary | ICD-10-CM

## 2019-12-01 DIAGNOSIS — N390 Urinary tract infection, site not specified: Secondary | ICD-10-CM

## 2019-12-01 DIAGNOSIS — L89152 Pressure ulcer of sacral region, stage 2: Secondary | ICD-10-CM | POA: Diagnosis present

## 2019-12-01 DIAGNOSIS — A419 Sepsis, unspecified organism: Secondary | ICD-10-CM | POA: Diagnosis present

## 2019-12-01 DIAGNOSIS — D72829 Elevated white blood cell count, unspecified: Secondary | ICD-10-CM

## 2019-12-01 DIAGNOSIS — B372 Candidiasis of skin and nail: Secondary | ICD-10-CM | POA: Diagnosis present

## 2019-12-01 DIAGNOSIS — D75839 Thrombocytosis, unspecified: Secondary | ICD-10-CM | POA: Diagnosis present

## 2019-12-01 DIAGNOSIS — D638 Anemia in other chronic diseases classified elsewhere: Secondary | ICD-10-CM | POA: Diagnosis present

## 2019-12-01 DIAGNOSIS — Z89511 Acquired absence of right leg below knee: Secondary | ICD-10-CM

## 2019-12-01 DIAGNOSIS — Y846 Urinary catheterization as the cause of abnormal reaction of the patient, or of later complication, without mention of misadventure at the time of the procedure: Secondary | ICD-10-CM | POA: Diagnosis present

## 2019-12-01 DIAGNOSIS — E1142 Type 2 diabetes mellitus with diabetic polyneuropathy: Secondary | ICD-10-CM | POA: Diagnosis present

## 2019-12-01 DIAGNOSIS — R748 Abnormal levels of other serum enzymes: Secondary | ICD-10-CM | POA: Diagnosis present

## 2019-12-01 DIAGNOSIS — A4152 Sepsis due to Pseudomonas: Secondary | ICD-10-CM | POA: Diagnosis present

## 2019-12-01 DIAGNOSIS — Z87440 Personal history of urinary (tract) infections: Secondary | ICD-10-CM

## 2019-12-01 DIAGNOSIS — B965 Pseudomonas (aeruginosa) (mallei) (pseudomallei) as the cause of diseases classified elsewhere: Secondary | ICD-10-CM

## 2019-12-01 DIAGNOSIS — E8809 Other disorders of plasma-protein metabolism, not elsewhere classified: Secondary | ICD-10-CM

## 2019-12-01 DIAGNOSIS — Z8546 Personal history of malignant neoplasm of prostate: Secondary | ICD-10-CM

## 2019-12-01 DIAGNOSIS — I4891 Unspecified atrial fibrillation: Secondary | ICD-10-CM | POA: Diagnosis present

## 2019-12-01 DIAGNOSIS — G9341 Metabolic encephalopathy: Secondary | ICD-10-CM | POA: Diagnosis present

## 2019-12-01 DIAGNOSIS — E119 Type 2 diabetes mellitus without complications: Secondary | ICD-10-CM

## 2019-12-01 DIAGNOSIS — I639 Cerebral infarction, unspecified: Secondary | ICD-10-CM

## 2019-12-01 DIAGNOSIS — R739 Hyperglycemia, unspecified: Secondary | ICD-10-CM

## 2019-12-01 DIAGNOSIS — E1151 Type 2 diabetes mellitus with diabetic peripheral angiopathy without gangrene: Secondary | ICD-10-CM | POA: Diagnosis present

## 2019-12-01 DIAGNOSIS — I739 Peripheral vascular disease, unspecified: Secondary | ICD-10-CM

## 2019-12-01 DIAGNOSIS — E785 Hyperlipidemia, unspecified: Secondary | ICD-10-CM | POA: Diagnosis present

## 2019-12-01 LAB — COMPREHENSIVE METABOLIC PANEL
ALT: 14 U/L (ref 0–44)
AST: 14 U/L — ABNORMAL LOW (ref 15–41)
Albumin: 2.9 g/dL — ABNORMAL LOW (ref 3.5–5.0)
Alkaline Phosphatase: 185 U/L — ABNORMAL HIGH (ref 38–126)
Anion gap: 10 (ref 5–15)
BUN: 21 mg/dL (ref 8–23)
CO2: 24 mmol/L (ref 22–32)
Calcium: 8.7 mg/dL — ABNORMAL LOW (ref 8.9–10.3)
Chloride: 100 mmol/L (ref 98–111)
Creatinine, Ser: 1.27 mg/dL — ABNORMAL HIGH (ref 0.61–1.24)
GFR, Estimated: 52 mL/min — ABNORMAL LOW (ref >60)
Glucose, Bld: 261 mg/dL — ABNORMAL HIGH (ref 70–99)
Potassium: 4.6 mmol/L (ref 3.5–5.1)
Sodium: 134 mmol/L — ABNORMAL LOW (ref 135–145)
Total Bilirubin: 0.4 mg/dL (ref 0.3–1.2)
Total Protein: 7.6 g/dL (ref 6.5–8.1)

## 2019-12-01 LAB — CBC WITH DIFFERENTIAL/PLATELET
Abs Immature Granulocytes: 0.12 10*3/uL — ABNORMAL HIGH (ref 0.00–0.07)
Basophils Absolute: 0.1 10*3/uL (ref 0.0–0.1)
Basophils Relative: 0 %
Eosinophils Absolute: 0.4 10*3/uL (ref 0.0–0.5)
Eosinophils Relative: 3 %
HCT: 32.8 % — ABNORMAL LOW (ref 39.0–52.0)
Hemoglobin: 9.9 g/dL — ABNORMAL LOW (ref 13.0–17.0)
Immature Granulocytes: 1 %
Lymphocytes Relative: 18 %
Lymphs Abs: 2.5 10*3/uL (ref 0.7–4.0)
MCH: 25.4 pg — ABNORMAL LOW (ref 26.0–34.0)
MCHC: 30.2 g/dL (ref 30.0–36.0)
MCV: 84.1 fL (ref 80.0–100.0)
Monocytes Absolute: 0.7 10*3/uL (ref 0.1–1.0)
Monocytes Relative: 5 %
Neutro Abs: 9.8 10*3/uL — ABNORMAL HIGH (ref 1.7–7.7)
Neutrophils Relative %: 73 %
Platelets: 746 10*3/uL — ABNORMAL HIGH (ref 150–400)
RBC: 3.9 MIL/uL — ABNORMAL LOW (ref 4.22–5.81)
RDW: 17 % — ABNORMAL HIGH (ref 11.5–15.5)
WBC: 13.5 10*3/uL — ABNORMAL HIGH (ref 4.0–10.5)
nRBC: 0 % (ref 0.0–0.2)

## 2019-12-01 LAB — RESPIRATORY PANEL BY RT PCR (FLU A&B, COVID)
Influenza A by PCR: NEGATIVE
Influenza B by PCR: NEGATIVE
SARS Coronavirus 2 by RT PCR: NEGATIVE

## 2019-12-01 LAB — LACTIC ACID, PLASMA: Lactic Acid, Venous: 1.5 mmol/L (ref 0.5–1.9)

## 2019-12-01 MED ORDER — SODIUM CHLORIDE 0.9 % IV BOLUS
500.0000 mL | Freq: Once | INTRAVENOUS | Status: AC
  Administered 2019-12-01: 500 mL via INTRAVENOUS

## 2019-12-01 NOTE — ED Provider Notes (Signed)
[facility] EMERGENCY DEPARTMENT Provider Note   CSN: [id number] Arrival date & time: 12/01/19  2134     History Chief Complaint  Patient presents with  . Altered Mental Status    Anthony Dunlap is a 86 y.o. male.  HPI He presents by EMS for evaluation of altered mental status, characterized by not acting himself, having slurred speech and decreased appetite.  He has also been having periods of feeling cold.  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  Tonight he is with his caregiver at 9 PM.  At that time she noticed that he was not moving his left side and was having slurred speech.  Since then he has been able to move his left side.  His daughter is in the room when I examined the patient at 10:35 PM.  She states that the left side movement disorder has resolved but that he still has some mild slurred speech.  She is concerned that since he went home from the hospital, 5 days ago, he has been sleeping a lot.  He is not known to be on narcotics at this time.  His daughter states that the patient has had some trouble swallowing since the hospitalization and has a sore on his buttocks.  She states that he had a UTI 3 weeks ago when she was concerned that he has not been treated for a urinary tract infection.  The patient's last urine culture, was on 11/20/2019, and he was discharged from the hospital, 7 days later.  The patient has had a indwelling Foley catheter for years.  The patient's daughter reports that he has had some blood in the urinary catheter bag for the duration of the time he has been home from the hospital.  There are no other known abnormalities.    Past Medical History:  Diagnosis Date  . Chronic heel ulcer, left, limited to breakdown of skin ([place]) 10/28/2019  . 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 Diabetes ([place])   . Drug-induced hepatitis 06/05/2018  . Inguinal hernia    06/09/2019: per patient " a long time  ago on the right side"  . Left eye pain   . Localized osteoarthrosis of right hip 01/05/2019  . Prostate cancer [facility])     Patient Active Problem List   Diagnosis Date Noted  . Typical atrial flutter ([place])   . Atrial fibrillation with RVR ([place])   . Acute osteomyelitis of left calcaneus ([name])   . Sepsis ([place]) 11/05/2019  . Chronic heel ulcer, left, limited to breakdown of skin ([place]) 10/28/2019  . Acute osteomyelitis of calcaneum, right ([place]) 09/25/2019  . Critical lower limb ischemia ([place]) 08/11/2019  . Hx of total hip arthroplasty, right 08/06/2019  . Arthritis of right hip 06/12/2019  . Pelvic abscess in male [facility]) 09/11/2018  . Drug-induced hepatitis 06/05/2018  . Calculus of gallbladder without cholecystitis without obstruction   . Fungemia/Candida albicans 05/10/2018  . UTI (urinary tract infection) 05/08/2018  . Complicated UTI (urinary tract infection) 05/08/2018  . 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 Abscess of left thigh   . Pressure ulcer 02/10/2018  . Enterococcus faecalis infection 01/27/2018  . Abscess   . Psoas abscess ([place]) 01/23/2018  . Sepsis due to Bacteroides species ([place]) 01/22/2018  . Sepsis without acute organ dysfunction ([place])   . DKA (diabetic ketoacidoses) 01/17/2018  . Normocytic anemia 01/17/2018  . Chronic anemia  01/03/2018  . Chronic indwelling Foley catheter 01/03/2018  . Elevated alkaline phosphatase level 01/03/2018  . Hypothyroidism 01/03/2018  . Physical deconditioning 01/03/2018  . 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 HLD (hyperlipidemia) 08/19/2013  . Type 2 diabetes mellitus ([place])   . Prostate cancer [facility])     Past Surgical History:  Procedure Laterality Date  . ABDOMINAL AORTOGRAM W/LOWER EXTREMITY Bilateral 08/12/2019   Procedure: ABDOMINAL AORTOGRAM W/LOWER EXTREMITY;  Surgeon: Marty Heck, MD;  Location: [place] [facility] LAB;  Service: Cardiovascular;  Laterality: Bilateral;  .  ABDOMINAL AORTOGRAM W/LOWER EXTREMITY Left 08/19/2019   Procedure: ABDOMINAL AORTOGRAM W/LOWER EXTREMITY;  Surgeon: Marty Heck, MD;  Location: [place] [facility];  Service: Cardiovascular;  Laterality: Left;  . AMPUTATION Right 09/28/2019   Procedure: AMPUTATION BELOW KNEE;  Surgeon: Rosetta Posner, MD;  Location: [place];  Service: Vascular;  Laterality: Right;  . AMPUTATION Left 11/11/2019   Procedure: AMPUTATION BELOW KNEE;  Surgeon: Rosetta Posner, MD;  Location: [facility];  Service: Vascular;  Laterality: Left;  . ESOPHAGOGASTRODUODENOSCOPY (EGD) WITH PROPOFOL N/A 11/09/2019   Procedure: ESOPHAGOGASTRODUODENOSCOPY (EGD) WITH PROPOFOL;  Surgeon: Lavena Bullion, DO;  Location: [place];  Service: Gastroenterology;  Laterality: N/A;  . HERNIA REPAIR    . IR RADIOLOGIST EVAL & MGMT  03/06/2018  . PERIPHERAL VASCULAR BALLOON ANGIOPLASTY Left 08/19/2019   Procedure: PERIPHERAL VASCULAR BALLOON ANGIOPLASTY;  Surgeon: Marty Heck, MD;  Location: [place] [facility];  Service: Cardiovascular;  Laterality: Left;  . PROSTATE SURGERY    . TOTAL HIP ARTHROPLASTY Right 06/12/2019   Procedure: RIGHT TOTAL HIP ARTHROPLASTY DIRECT ANTERIOR;  Surgeon: Marybelle Killings, MD;  Location: [place];  Service: Orthopedics;  Laterality: Right;       Family History  Problem Relation Age of Onset  . Pneumonia Father   . Cancer Sister     Social History   Tobacco Use  . Smoking status: Never Smoker  . Smokeless tobacco: Never Used  Vaping Use  . Vaping Use: Never used  Substance Use Topics  . Alcohol use: No  . Drug use: No    Home Medications Prior to Admission medications   Medication Sig Start Date End Date Taking? Authorizing Provider  acetaminophen (TYLENOL) 500 MG tablet Take 1,000 mg by mouth every 6 (six) hours as needed for mild pain or moderate pain.    [provider]  ALPRAZolam Duanne Moron) 0.5 MG tablet Take 1 tablet (0.5 mg total) by mouth 2 (two) times daily as needed for  anxiety. 09/30/19   Geradine Girt, DO  amiodarone (PACERONE) 200 MG tablet Take 1 tablet (200 mg total) by mouth 2 (two) times daily for 14 days. Amiodarone $RemoveBeforeDE'200mg'MmOiQItLEmQeFZG$  BID for 14 days and then once daily. 11/26/19 12/10/19  Bunnie Pion Z, DO  amitriptyline (ELAVIL) 10 MG tablet Take 20 mg by mouth at bedtime.  11/28/18   [provider]  apixaban (ELIQUIS) 5 MG TABS tablet Take 1 tablet (5 mg total) by mouth 2 (two) times daily. 11/15/19   Antonieta Pert, MD  clopidogrel (PLAVIX) 75 MG tablet Take 1 tablet (75 mg total) by mouth daily. Patient not taking: Reported on 10/28/2019 08/19/19 08/18/20  Marty Heck, MD  colchicine 0.6 MG tablet Take 0.6 mg by mouth daily as needed (gout attacks).  07/27/19   [provider]  diltiazem (CARDIZEM CD) 240 MG 24 hr capsule Take 1 capsule (240 mg total) by mouth daily. 11/14/19  Antonieta Pert, MD  diltiazem (CARDIZEM CD) 300 MG 24 hr capsule Take 1 capsule (300 mg total) by mouth daily. 11/27/19   Bunnie Pion Z, DO  ferrous sulfate 325 (65 FE) MG tablet Take 1 tablet (325 mg total) by mouth 2 (two) times daily with a meal. 09/30/19   Vann, Jessica U, DO  insulin aspart (NOVOLOG) 100 UNIT/ML injection Inject 0-15 Units into the skin 3 (three) times daily with meals. Sliding scale insulin Less than 70 initiate hypoglycemia protocol 70-120  0 units 120-150 2 unit 151-200 3 units 201-250 3 units 251-300 5 units 301-350 8 units 351-400 11 units  Greater than 400 15 units , call MD Patient taking differently: Inject 0-16 Units into the skin See admin instructions. Inject 0-16 units subcutaneously three times daily with meals per sliding scale: CBG 70-120 0 units 120-150 4 units, 151-200 5 units, 201-250 6 units, 251-300 7 units, 301-350 10 units, 351-400 13 units, >400 16 units and call MD (Less than 70 initiate hypoglycemia protocol) 02/18/18   Oswald Hillock, MD  insulin glargine (LANTUS) 100 UNIT/ML injection Inject 0.04 mLs (4 Units total) into the  skin at bedtime. 11/13/19   Antonieta Pert, MD  insulin glargine (LANTUS) 100 UNIT/ML injection Inject 0.06 mLs (6 Units total) into the skin at bedtime. 11/17/19 12/17/19  Antonieta Pert, MD  levothyroxine (SYNTHROID, LEVOTHROID) 100 MCG tablet Take 100 mcg by mouth daily with breakfast.  04/18/18   [provider]  metoprolol succinate (TOPROL-XL) 100 MG 24 hr tablet Take 1 tablet (100 mg total) by mouth daily. Take with or immediately following a meal. 11/27/19   Bunnie Pion Z, DO  metoprolol succinate (TOPROL-XL) 50 MG 24 hr tablet Take 1 tablet (50 mg total) by mouth daily. Take with or immediately following a meal. 11/14/19   Antonieta Pert, MD  Multiple Vitamin (MULTIVITAMIN WITH MINERALS) TABS tablet Take 1 tablet by mouth daily. 09/30/19   Geradine Girt, DO  nutrition supplement, JUVEN, (JUVEN) PACK Take 1 packet by mouth 2 (two) times daily between meals. 09/30/19   Geradine Girt, DO  Nutritional Supplements (,FEEDING SUPPLEMENT, PROSOURCE PLUS) liquid Take 30 mLs by mouth 2 (two) times daily with a meal. 09/30/19   Geradine Girt, DO  nystatin (MYCOSTATIN/NYSTOP) powder Apply topically 4 (four) times daily. Patient taking differently: Apply 1 application topically daily as needed (yeast).  03/03/19   Truman Hayward, MD  oxybutynin (DITROPAN-XL) 5 MG 24 hr tablet Take 1 tablet (5 mg total) by mouth at bedtime. 11/13/19   Antonieta Pert, MD  oxyCODONE (OXY IR/ROXICODONE) 5 MG immediate release tablet Take 1 tablet (5 mg total) by mouth every 4 (four) hours as needed for moderate pain. 09/30/19   Geradine Girt, DO  pantoprazole (PROTONIX) 40 MG tablet Take 1 tablet (40 mg total) by mouth daily. 09/30/19   Geradine Girt, DO  pravastatin (PRAVACHOL) 10 MG tablet Take 10 mg by mouth daily with supper.     [provider]  senna-docusate (SENOKOT-S) 8.6-50 MG tablet Take 1 tablet by mouth 2 (two) times daily. 09/30/19   Geradine Girt, DO  silver sulfADIAZINE (SILVADENE) 1 % cream Apply  topically 2 (two) times daily. 09/30/19   Geradine Girt, DO  tobramycin (TOBREX) 0.3 % ophthalmic solution Place 2 drops into both eyes every 4 (four) hours while awake. 11/13/19   Antonieta Pert, MD    Allergies    Fluconazole  Review of Systems  Review of Systems  Unable to perform ROS: Mental status change    Physical Exam Updated Vital Signs BP 138/82   Pulse 80   Temp 98.2 F (36.8 C) (Rectal)   Resp 18   Ht $R'5\' 8"'YT$  (1.727 m)   Wt 65.9 kg   SpO2 98%   BMI 22.09 kg/m   Physical Exam Vitals and nursing note reviewed.  Constitutional:      Appearance: He is well-developed.  HENT:     Head: Normocephalic and atraumatic.     Right Ear: External ear normal.     Left Ear: External ear normal.  Eyes:     Conjunctiva/sclera: Conjunctivae normal.     Pupils: Pupils are equal, round, and reactive to light.  Neck:     Trachea: Phonation normal.  Cardiovascular:     Rate and Rhythm: Normal rate and regular rhythm.     Heart sounds: Normal heart sounds.  Pulmonary:     Effort: Pulmonary effort is normal.     Breath sounds: Normal breath sounds.  Abdominal:     Palpations: Abdomen is soft.     Tenderness: There is no abdominal tenderness.  Musculoskeletal:        General: Normal range of motion.     Cervical back: Normal range of motion and neck supple.     Comments: Bilateral BKA's.  I examined both stumps.  There continues to be staples in the left stump, and this wound is not dehisced, and there is no drainage, fluctuance or bleeding from it.  The right stump has a fairly well-healed incision, without fluctuance, drainage or bleeding.  The patient has normal strength of the arms and legs bilaterally.  Skin:    General: Skin is warm and dry.     Comments: There is a stage I presacral pressure breakdown noted with some mild bleeding from the area.  No other lesions of the posterior thorax or buttocks.  Neurological:     Mental Status: He is alert and oriented to person,  place, and time.     Cranial Nerves: No cranial nerve deficit.     Sensory: No sensory deficit.     Motor: No abnormal muscle tone.     Coordination: Coordination normal.     Comments: There is no aphasia or nystagmus.  There is questionable mild dysarthria.  There is no neglect.  Psychiatric:        Mood and Affect: Mood normal.        Behavior: Behavior normal.     ED Results / Procedures / Treatments   Labs (all labs ordered are listed, but only abnormal results are displayed) Labs Reviewed  COMPREHENSIVE METABOLIC PANEL - Abnormal; Notable for the following components:      Result Value   Sodium 134 (*)    Glucose, Bld 261 (*)    Creatinine, Ser 1.27 (*)    Calcium 8.7 (*)    Albumin 2.9 (*)    AST 14 (*)    Alkaline Phosphatase 185 (*)    GFR, Estimated 52 (*)    All other components within normal limits  CBC WITH DIFFERENTIAL/PLATELET - Abnormal; Notable for the following components:   WBC 13.5 (*)    RBC 3.90 (*)    Hemoglobin 9.9 (*)    HCT 32.8 (*)    MCH 25.4 (*)    RDW 17.0 (*)    Platelets 746 (*)    Neutro Abs 9.8 (*)    Abs Immature  Granulocytes 0.12 (*)    All other components within normal limits  RESPIRATORY PANEL BY RT PCR (FLU A&B, COVID)  CULTURE, BLOOD (ROUTINE X 2)  CULTURE, BLOOD (ROUTINE X 2)  LACTIC ACID, PLASMA  URINALYSIS, ROUTINE W REFLEX MICROSCOPIC    EKG EKG Interpretation  Date/Time:  Tuesday December 01 2019 21:41:06 EDT Ventricular Rate:  82 PR Interval:    QRS Duration: 91 QT Interval:  425 QTC Calculation: 497 R Axis:   56 Text Interpretation: Sinus rhythm Short PR interval Abnormal R-wave progression, early transition Borderline repolarization abnormality Borderline prolonged QT interval Since last tracing now in normal sinus rhythym, and QT interval is longer. Otherwise no significant change Confirmed by Daleen Bo [phone]) on 12/01/2019 11:21:41 PM   Radiology DG Chest 1 View  Result Date: 12/01/2019 CLINICAL DATA:   Altered mental status EXAM: CHEST  1 VIEW COMPARISON:  11/19/2019 FINDINGS: Cardiac shadow is within normal limits. Tortuous thoracic aorta is noted. The lungs are well aerated without focal infiltrate or sizable effusion. Degenerative changes of the thoracic spine are noted. IMPRESSION: No active disease. Electronically Signed   By: Inez Catalina M.D.   On: 12/01/2019 23:53   CT Head Wo Contrast  Result Date: 12/01/2019 CLINICAL DATA:  Altered mental status, slurred speech EXAM: CT HEAD WITHOUT CONTRAST TECHNIQUE: Contiguous axial images were obtained from the base of the skull through the vertex without intravenous contrast. COMPARISON:  MRI 08/20/2013 (images only) CT head 08/10/2013 (report only) FINDINGS: Brain: No evidence of acute infarction, hemorrhage, hydrocephalus, extra-axial collection, visible mass lesion or mass effect. Symmetric prominence of the ventricles, cisterns and sulci compatible with parenchymal volume loss. Patchy areas of white matter hypoattenuation are most compatible with chronic microvascular angiopathy. Vascular: Atherosclerotic calcification of the carotid siphons and intradural vertebral arteries. No hyperdense vessel. Skull: No calvarial fracture or suspicious osseous lesion. No scalp swelling or hematoma. Sinuses/Orbits: Paranasal sinuses and mastoid air cells are predominantly clear. Orbital structures are unremarkable aside from prior lens extractions. Other: Debris in the right external auditory canal. IMPRESSION: 1. No acute intracranial findings. If there is persisting concern for acute infarction, MRI is more sensitive and specific for early features of ischemia. 2. Chronic microvascular angiopathy and parenchymal volume loss. 3. Debris in the right external auditory canal, correlate for cerumen impaction. Electronically Signed   By: Lovena Le M.D.   On: 12/01/2019 23:29    Procedures Procedures (including critical care time)  Medications Ordered in  ED Medications  sodium chloride 0.9 % bolus 500 mL (0 mLs Intravenous Stopped 12/02/19 0005)    ED Course  I have reviewed the triage vital signs and the nursing notes.  Pertinent labs & imaging results that were available during my care of the patient were reviewed by me and considered in my medical decision making (see chart for details).  Clinical Course as of Dec 01 40  Tue Dec 01, 2019  2338 Normal  Lactic acid, plasma [EW]  2338 Normal except white count high, hemoglobin low, platelets high  CBC with Differential(!) [EW]  2338 Normal except sodium low, glucose high, creatinine high, calcium low, albumin low, AST low, alk phos stays high, GFR low  Comprehensive metabolic panel(!) [EW]  8341 Normal  Respiratory Panel by RT PCR (Flu A&B, Covid) - Nasopharyngeal Swab [EW]  Wed Dec 02, 2019  0006 No infiltrate or edema, interpreted by me  DG Chest 1 View [EW]  0006 Per radiologist no definite acute abnormality.  CT Head Wo Contrast [  EW]    Clinical Course User Index [EW] Daleen Bo, MD   MDM Rules/Calculators/A&P                           Patient Vitals for the past 24 hrs:  BP Temp Temp src Pulse Resp SpO2 Height Weight  12/02/19 0000 138/82 -- -- -- 18 -- -- --  12/01/19 2300 135/77 -- -- -- 16 -- -- --  12/01/19 2247 -- 98.2 F (36.8 C) Rectal -- -- -- -- --  12/01/19 2200 121/74 -- -- 80 (!) 23 98 % -- --  12/01/19 2140 139/79 98.3 F (36.8 C) -- 81 15 99 % -- --  12/01/19 2139 -- -- -- -- -- -- $Rem'5\' 8"'WzwP$  (1.727 m) 65.9 kg    12:42 AM Reevaluation with update and discussion. After initial assessment and treatment, an updated evaluation reveals no change in clinical status.  Patient's daughter updated on the phone. Daleen Bo   Medical Decision Making:  This patient is presenting for evaluation of altered mental status following recent hospitalization with chills and hematuria, which does require a range of treatment options, and is a complaint that involves a  high risk of morbidity and mortality. The differential diagnoses include UTI, bacteremia, metabolic disorder, CVA. I decided to review old records, and in summary elderly male, being monitored at home by caregivers following recent hospital discharge when he had a left leg BKA.  He has a chronic indwelling Foley catheter.  I obtained additional historical information from the daughter at the bedside.  Clinical Laboratory Tests Ordered, included CBC, Metabolic panel and Lipase. Review indicates white count high, hemoglobin low, sodium low, glucose high, creatinine high, calcium low, albumin low. Radiologic Tests Ordered, included chest x-ray, CT head.  I independently Visualized: Radiographic images, which show no acute abnormalities    Critical Interventions-clinical evaluation, laboratory testing, radiography, observation reassessment  After These Interventions, the Patient was reevaluated and was found improved status as compared to presenting complaint.  No weakness on evaluation.  Minimal dysarthria on evaluation.  Daughter stating slurred speech continues.  Patient has new pressure sore on buttock, but is not causing any significant problems.  Doubt severe sepsis or metabolic instability.  Patient will require admission for MRI imaging in the morning.  Neuro consultation ordered, since patient will be admitted by an internist overnight.  CRITICAL CARE-no Performed by: Daleen Bo  Nursing Notes Reviewed/ Care Coordinated Applicable Imaging Reviewed Interpretation of Laboratory Data incorporated into ED treatment  35:45 AM-Dr. Roxanne Mins to evaluate urinalysis, and teleneuro consultation, then arrange hospitalization for further evaluation and treatment    Final Clinical Impression(s) / ED Diagnoses Final diagnoses:  Slurred speech  Hyperglycemia  Pressure injury of buttock, stage 1, unspecified laterality    Rx / DC Orders ED Discharge Orders    None       Daleen Bo,  MD 12/02/19 [phone]

## 2019-12-01 NOTE — ED Triage Notes (Signed)
Pt from home. Pt recently d/c'd from [facility] after left BKA. Caregiver states he is acting not like himself. A&O x 3.

## 2019-12-02 ENCOUNTER — Observation Stay (HOSPITAL_COMMUNITY): Payer: Medicare Other

## 2019-12-02 ENCOUNTER — Other Ambulatory Visit: Payer: Self-pay

## 2019-12-02 DIAGNOSIS — Z96641 Presence of right artificial hip joint: Secondary | ICD-10-CM | POA: Diagnosis present

## 2019-12-02 DIAGNOSIS — L89152 Pressure ulcer of sacral region, stage 2: Secondary | ICD-10-CM | POA: Diagnosis present

## 2019-12-02 DIAGNOSIS — R9431 Abnormal electrocardiogram [ECG] [EKG]: Secondary | ICD-10-CM | POA: Insufficient documentation

## 2019-12-02 DIAGNOSIS — T83511A Infection and inflammatory reaction due to indwelling urethral catheter, initial encounter: Secondary | ICD-10-CM | POA: Diagnosis present

## 2019-12-02 DIAGNOSIS — E8809 Other disorders of plasma-protein metabolism, not elsewhere classified: Secondary | ICD-10-CM | POA: Diagnosis present

## 2019-12-02 DIAGNOSIS — E039 Hypothyroidism, unspecified: Secondary | ICD-10-CM | POA: Diagnosis not present

## 2019-12-02 DIAGNOSIS — E782 Mixed hyperlipidemia: Secondary | ICD-10-CM

## 2019-12-02 DIAGNOSIS — L89151 Pressure ulcer of sacral region, stage 1: Secondary | ICD-10-CM | POA: Diagnosis not present

## 2019-12-02 DIAGNOSIS — I6389 Other cerebral infarction: Secondary | ICD-10-CM | POA: Diagnosis not present

## 2019-12-02 DIAGNOSIS — Y846 Urinary catheterization as the cause of abnormal reaction of the patient, or of later complication, without mention of misadventure at the time of the procedure: Secondary | ICD-10-CM | POA: Diagnosis present

## 2019-12-02 DIAGNOSIS — R471 Dysarthria and anarthria: Secondary | ICD-10-CM | POA: Diagnosis present

## 2019-12-02 DIAGNOSIS — Z20822 Contact with and (suspected) exposure to covid-19: Secondary | ICD-10-CM | POA: Diagnosis present

## 2019-12-02 DIAGNOSIS — R4781 Slurred speech: Secondary | ICD-10-CM | POA: Diagnosis present

## 2019-12-02 DIAGNOSIS — G9341 Metabolic encephalopathy: Secondary | ICD-10-CM | POA: Diagnosis present

## 2019-12-02 DIAGNOSIS — N390 Urinary tract infection, site not specified: Secondary | ICD-10-CM | POA: Diagnosis present

## 2019-12-02 DIAGNOSIS — A419 Sepsis, unspecified organism: Secondary | ICD-10-CM | POA: Diagnosis not present

## 2019-12-02 DIAGNOSIS — Z794 Long term (current) use of insulin: Secondary | ICD-10-CM | POA: Diagnosis not present

## 2019-12-02 DIAGNOSIS — N179 Acute kidney failure, unspecified: Secondary | ICD-10-CM | POA: Diagnosis present

## 2019-12-02 DIAGNOSIS — D75839 Thrombocytosis, unspecified: Secondary | ICD-10-CM | POA: Insufficient documentation

## 2019-12-02 DIAGNOSIS — Z8546 Personal history of malignant neoplasm of prostate: Secondary | ICD-10-CM | POA: Diagnosis not present

## 2019-12-02 DIAGNOSIS — A4152 Sepsis due to Pseudomonas: Secondary | ICD-10-CM | POA: Diagnosis present

## 2019-12-02 DIAGNOSIS — E1142 Type 2 diabetes mellitus with diabetic polyneuropathy: Secondary | ICD-10-CM | POA: Diagnosis present

## 2019-12-02 DIAGNOSIS — R748 Abnormal levels of other serum enzymes: Secondary | ICD-10-CM

## 2019-12-02 DIAGNOSIS — Z7901 Long term (current) use of anticoagulants: Secondary | ICD-10-CM | POA: Diagnosis not present

## 2019-12-02 DIAGNOSIS — I480 Paroxysmal atrial fibrillation: Secondary | ICD-10-CM | POA: Diagnosis present

## 2019-12-02 DIAGNOSIS — I4891 Unspecified atrial fibrillation: Secondary | ICD-10-CM

## 2019-12-02 DIAGNOSIS — Z7902 Long term (current) use of antithrombotics/antiplatelets: Secondary | ICD-10-CM | POA: Diagnosis not present

## 2019-12-02 DIAGNOSIS — D72829 Elevated white blood cell count, unspecified: Secondary | ICD-10-CM | POA: Insufficient documentation

## 2019-12-02 DIAGNOSIS — L89301 Pressure ulcer of unspecified buttock, stage 1: Secondary | ICD-10-CM | POA: Diagnosis not present

## 2019-12-02 DIAGNOSIS — Z79899 Other long term (current) drug therapy: Secondary | ICD-10-CM | POA: Diagnosis not present

## 2019-12-02 DIAGNOSIS — E1165 Type 2 diabetes mellitus with hyperglycemia: Secondary | ICD-10-CM | POA: Diagnosis present

## 2019-12-02 DIAGNOSIS — D638 Anemia in other chronic diseases classified elsewhere: Secondary | ICD-10-CM | POA: Diagnosis present

## 2019-12-02 DIAGNOSIS — E1151 Type 2 diabetes mellitus with diabetic peripheral angiopathy without gangrene: Secondary | ICD-10-CM | POA: Diagnosis present

## 2019-12-02 DIAGNOSIS — Z89512 Acquired absence of left leg below knee: Secondary | ICD-10-CM | POA: Diagnosis not present

## 2019-12-02 LAB — URINALYSIS, ROUTINE W REFLEX MICROSCOPIC
Bilirubin Urine: NEGATIVE
Glucose, UA: NEGATIVE mg/dL
Ketones, ur: 5 mg/dL — AB
Nitrite: POSITIVE — AB
Protein, ur: 100 mg/dL — AB
RBC / HPF: >50 RBC/hpf — ABNORMAL HIGH (ref 0–5)
Specific Gravity, Urine: 1.017 (ref 1.005–1.030)
WBC, UA: >50 WBC/hpf — ABNORMAL HIGH (ref 0–5)
pH: 5 (ref 5.0–8.0)

## 2019-12-02 LAB — COMPREHENSIVE METABOLIC PANEL
ALT: 14 U/L (ref 0–44)
AST: 13 U/L — ABNORMAL LOW (ref 15–41)
Albumin: 3 g/dL — ABNORMAL LOW (ref 3.5–5.0)
Alkaline Phosphatase: 179 U/L — ABNORMAL HIGH (ref 38–126)
Anion gap: 10 (ref 5–15)
BUN: 21 mg/dL (ref 8–23)
CO2: 23 mmol/L (ref 22–32)
Calcium: 8.8 mg/dL — ABNORMAL LOW (ref 8.9–10.3)
Chloride: 103 mmol/L (ref 98–111)
Creatinine, Ser: 1.05 mg/dL (ref 0.61–1.24)
GFR, Estimated: >60 mL/min (ref >60)
Glucose, Bld: 237 mg/dL — ABNORMAL HIGH (ref 70–99)
Potassium: 4.2 mmol/L (ref 3.5–5.1)
Sodium: 136 mmol/L (ref 135–145)
Total Bilirubin: 0.5 mg/dL (ref 0.3–1.2)
Total Protein: 7.5 g/dL (ref 6.5–8.1)

## 2019-12-02 LAB — PHOSPHORUS: Phosphorus: 3.6 mg/dL (ref 2.5–4.6)

## 2019-12-02 LAB — CBC
HCT: 31.8 % — ABNORMAL LOW (ref 39.0–52.0)
Hemoglobin: 9.5 g/dL — ABNORMAL LOW (ref 13.0–17.0)
MCH: 24.8 pg — ABNORMAL LOW (ref 26.0–34.0)
MCHC: 29.9 g/dL — ABNORMAL LOW (ref 30.0–36.0)
MCV: 83 fL (ref 80.0–100.0)
Platelets: 684 10*3/uL — ABNORMAL HIGH (ref 150–400)
RBC: 3.83 MIL/uL — ABNORMAL LOW (ref 4.22–5.81)
RDW: 16.9 % — ABNORMAL HIGH (ref 11.5–15.5)
WBC: 10.3 10*3/uL (ref 4.0–10.5)
nRBC: 0 % (ref 0.0–0.2)

## 2019-12-02 LAB — GLUCOSE, CAPILLARY
Glucose-Capillary: 244 mg/dL — ABNORMAL HIGH (ref 70–99)
Glucose-Capillary: 267 mg/dL — ABNORMAL HIGH (ref 70–99)
Glucose-Capillary: 187 mg/dL — ABNORMAL HIGH (ref 70–99)
Glucose-Capillary: 152 mg/dL — ABNORMAL HIGH (ref 70–99)

## 2019-12-02 LAB — LIPID PANEL
Cholesterol: 168 mg/dL (ref 0–200)
HDL: 39 mg/dL — ABNORMAL LOW (ref >40)
LDL Cholesterol: 80 mg/dL (ref 0–99)
Total CHOL/HDL Ratio: 4.3 RATIO
Triglycerides: 245 mg/dL — ABNORMAL HIGH (ref <150)
VLDL: 49 mg/dL — ABNORMAL HIGH (ref 0–40)

## 2019-12-02 LAB — PROTIME-INR
INR: 1.5 — ABNORMAL HIGH (ref 0.8–1.2)
Prothrombin Time: 17.3 seconds — ABNORMAL HIGH (ref 11.4–15.2)

## 2019-12-02 LAB — ECHOCARDIOGRAM LIMITED
Area-P 1/2: 3.85 cm2
Height: 68 in
S' Lateral: 2.27 cm
Weight: 2324.53 oz

## 2019-12-02 LAB — APTT: aPTT: 45 seconds — ABNORMAL HIGH (ref 24–36)

## 2019-12-02 LAB — MAGNESIUM: Magnesium: 1.9 mg/dL (ref 1.7–2.4)

## 2019-12-02 MED ORDER — INSULIN GLARGINE 100 UNIT/ML ~~LOC~~ SOLN
4.0000 [IU] | Freq: Every day | SUBCUTANEOUS | Status: DC
  Administered 2019-12-02 – 2019-12-03 (×2): 4 [IU] via SUBCUTANEOUS
  Filled 2019-12-02 (×3): qty 0.04

## 2019-12-02 MED ORDER — APIXABAN 5 MG PO TABS
5.0000 mg | ORAL_TABLET | Freq: Two times a day (BID) | ORAL | Status: DC
  Administered 2019-12-02 – 2019-12-06 (×8): 5 mg via ORAL
  Filled 2019-12-02 (×8): qty 1

## 2019-12-02 MED ORDER — JUVEN PO PACK
1.0000 | PACK | Freq: Two times a day (BID) | ORAL | Status: DC
  Administered 2019-12-03 – 2019-12-06 (×6): 1 via ORAL
  Filled 2019-12-02 (×6): qty 1

## 2019-12-02 MED ORDER — TOBRAMYCIN 0.3 % OP SOLN
1.0000 [drp] | OPHTHALMIC | Status: DC
  Administered 2019-12-02 – 2019-12-06 (×24): 1 [drp] via OPHTHALMIC
  Filled 2019-12-02: qty 5

## 2019-12-02 MED ORDER — SODIUM CHLORIDE 0.9 % IV SOLN: 1.0000 g | INTRAVENOUS | Status: DC

## 2019-12-02 MED ORDER — COLCHICINE 0.6 MG PO TABS: 0.6000 mg | ORAL_TABLET | Freq: Every day | ORAL | Status: DC | PRN

## 2019-12-02 MED ORDER — SODIUM CHLORIDE 0.9 % IV SOLN
2.0000 g | INTRAVENOUS | Status: DC
  Administered 2019-12-02 – 2019-12-03 (×2): 2 g via INTRAVENOUS
  Filled 2019-12-02 (×2): qty 2

## 2019-12-02 MED ORDER — LEVOTHYROXINE SODIUM 75 MCG PO TABS
75.0000 ug | ORAL_TABLET | Freq: Every day | ORAL | Status: DC
  Administered 2019-12-03: 75 ug via ORAL
  Filled 2019-12-02: qty 1

## 2019-12-02 MED ORDER — CLOPIDOGREL BISULFATE 75 MG PO TABS
75.0000 mg | ORAL_TABLET | Freq: Every day | ORAL | Status: DC
  Administered 2019-12-02 – 2019-12-06 (×5): 75 mg via ORAL
  Filled 2019-12-02 (×5): qty 1

## 2019-12-02 MED ORDER — SODIUM CHLORIDE 0.9 % IV SOLN
2.0000 g | Freq: Once | INTRAVENOUS | Status: AC
  Administered 2019-12-02: 2 g via INTRAVENOUS
  Filled 2019-12-02: qty 20

## 2019-12-02 MED ORDER — OXYBUTYNIN CHLORIDE ER 5 MG PO TB24
5.0000 mg | ORAL_TABLET | Freq: Every day | ORAL | Status: DC
  Administered 2019-12-02 – 2019-12-04 (×3): 5 mg via ORAL
  Filled 2019-12-02 (×3): qty 1

## 2019-12-02 MED ORDER — NYSTATIN 100000 UNIT/GM EX POWD
Freq: Three times a day (TID) | CUTANEOUS | Status: DC
  Filled 2019-12-02 (×2): qty 15

## 2019-12-02 MED ORDER — SENNOSIDES-DOCUSATE SODIUM 8.6-50 MG PO TABS: 1.0000 | ORAL_TABLET | Freq: Two times a day (BID) | ORAL | Status: DC | PRN

## 2019-12-02 MED ORDER — FERROUS SULFATE 325 (65 FE) MG PO TABS
325.0000 mg | ORAL_TABLET | Freq: Two times a day (BID) | ORAL | Status: DC
  Administered 2019-12-03 – 2019-12-06 (×7): 325 mg via ORAL
  Filled 2019-12-02 (×7): qty 1

## 2019-12-02 MED ORDER — DILTIAZEM HCL ER COATED BEADS 180 MG PO CP24
300.0000 mg | ORAL_CAPSULE | Freq: Every day | ORAL | Status: DC
  Administered 2019-12-02 – 2019-12-06 (×5): 300 mg via ORAL
  Filled 2019-12-02 (×5): qty 1

## 2019-12-02 MED ORDER — ACETAMINOPHEN 325 MG PO TABS: 650.0000 mg | ORAL_TABLET | Freq: Four times a day (QID) | ORAL | Status: DC | PRN

## 2019-12-02 MED ORDER — ASPIRIN EC 81 MG PO TBEC
81.0000 mg | DELAYED_RELEASE_TABLET | Freq: Every day | ORAL | Status: DC
  Administered 2019-12-02 – 2019-12-06 (×5): 81 mg via ORAL
  Filled 2019-12-02 (×5): qty 1

## 2019-12-02 MED ORDER — INSULIN ASPART 100 UNIT/ML ~~LOC~~ SOLN
0.0000 [IU] | Freq: Every day | SUBCUTANEOUS | Status: DC
  Administered 2019-12-04 – 2019-12-05 (×2): 3 [IU] via SUBCUTANEOUS

## 2019-12-02 MED ORDER — INSULIN ASPART 100 UNIT/ML ~~LOC~~ SOLN
0.0000 [IU] | Freq: Three times a day (TID) | SUBCUTANEOUS | Status: DC
  Administered 2019-12-02: 3 [IU] via SUBCUTANEOUS
  Administered 2019-12-02: 8 [IU] via SUBCUTANEOUS
  Administered 2019-12-03: 5 [IU] via SUBCUTANEOUS
  Administered 2019-12-03: 11 [IU] via SUBCUTANEOUS
  Administered 2019-12-03: 5 [IU] via SUBCUTANEOUS
  Administered 2019-12-04 (×2): 11 [IU] via SUBCUTANEOUS
  Administered 2019-12-04: 5 [IU] via SUBCUTANEOUS
  Administered 2019-12-05: 11 [IU] via SUBCUTANEOUS
  Administered 2019-12-05: 8 [IU] via SUBCUTANEOUS
  Administered 2019-12-05: 11 [IU] via SUBCUTANEOUS
  Administered 2019-12-06 (×2): 15 [IU] via SUBCUTANEOUS
  Administered 2019-12-06: 8 [IU] via SUBCUTANEOUS

## 2019-12-02 MED ORDER — AMITRIPTYLINE HCL 10 MG PO TABS
20.0000 mg | ORAL_TABLET | Freq: Every day | ORAL | Status: DC
  Administered 2019-12-02 – 2019-12-05 (×4): 20 mg via ORAL
  Filled 2019-12-02 (×4): qty 2

## 2019-12-02 MED ORDER — SODIUM CHLORIDE 0.9 % IV SOLN: Freq: Once | INTRAVENOUS | Status: AC

## 2019-12-02 MED ORDER — METOPROLOL SUCCINATE ER 50 MG PO TB24
100.0000 mg | ORAL_TABLET | Freq: Every day | ORAL | Status: DC
  Administered 2019-12-02 – 2019-12-06 (×5): 100 mg via ORAL
  Filled 2019-12-02 (×7): qty 2

## 2019-12-02 MED ORDER — ACETAMINOPHEN 650 MG RE SUPP: 650.0000 mg | Freq: Four times a day (QID) | RECTAL | Status: DC | PRN

## 2019-12-02 MED ORDER — ADULT MULTIVITAMIN W/MINERALS CH
1.0000 | ORAL_TABLET | Freq: Every day | ORAL | Status: DC
  Administered 2019-12-02 – 2019-12-06 (×5): 1 via ORAL
  Filled 2019-12-02 (×5): qty 1

## 2019-12-02 MED ORDER — CHLORHEXIDINE GLUCONATE CLOTH 2 % EX PADS
6.0000 | MEDICATED_PAD | Freq: Every day | CUTANEOUS | Status: DC
  Administered 2019-12-03 – 2019-12-06 (×4): 6 via TOPICAL

## 2019-12-02 MED ORDER — GLUCERNA SHAKE PO LIQD
237.0000 mL | Freq: Three times a day (TID) | ORAL | Status: DC
  Administered 2019-12-02 – 2019-12-06 (×11): 237 mL via ORAL
  Filled 2019-12-02 (×2): qty 237

## 2019-12-02 NOTE — ED Provider Notes (Signed)
Care assumed from Dr. Eulis Foster, patient with altered mental status pending urinalysis and teleneurology consultation.  Teleneurology finds no focal neurologic findings recommends MRI scan in the morning.  Urinalysis is significant for WBC clumps consistent with UTI.  Urine is sent for culture and is started on ceftriaxone.  Case is discussed with Dr. Josephine Cables who agrees to admit the patient but does request Foley catheter be exchanged.  Results for orders placed or performed during the hospital encounter of 12/01/19  Respiratory Panel by RT PCR (Flu A&B, Covid) - Nasopharyngeal Swab   Specimen: Nasopharyngeal Swab  Result Value Ref Range   SARS Coronavirus 2 by RT PCR NEGATIVE NEGATIVE   Influenza A by PCR NEGATIVE NEGATIVE   Influenza B by PCR NEGATIVE NEGATIVE  Comprehensive metabolic panel  Result Value Ref Range   Sodium 134 (L) 135 - 145 mmol/L   Potassium 4.6 3.5 - 5.1 mmol/L   Chloride 100 98 - 111 mmol/L   CO2 24 22 - 32 mmol/L   Glucose, Bld 261 (H) 70 - 99 mg/dL   BUN 21 8 - 23 mg/dL   Creatinine, Ser 1.27 (H) 0.61 - 1.24 mg/dL   Calcium 8.7 (L) 8.9 - 10.3 mg/dL   Total Protein 7.6 6.5 - 8.1 g/dL   Albumin 2.9 (L) 3.5 - 5.0 g/dL   AST 14 (L) 15 - 41 U/L   ALT 14 0 - 44 U/L   Alkaline Phosphatase 185 (H) 38 - 126 U/L   Total Bilirubin 0.4 0.3 - 1.2 mg/dL   GFR, Estimated 52 (L) >60 mL/min   Anion gap 10 5 - 15  Lactic acid, plasma  Result Value Ref Range   Lactic Acid, Venous 1.5 0.5 - 1.9 mmol/L  CBC with Differential  Result Value Ref Range   WBC 13.5 (H) 4.0 - 10.5 K/uL   RBC 3.90 (L) 4.22 - 5.81 MIL/uL   Hemoglobin 9.9 (L) 13.0 - 17.0 g/dL   HCT 32.8 (L) 39 - 52 %   MCV 84.1 80.0 - 100.0 fL   MCH 25.4 (L) 26.0 - 34.0 pg   MCHC 30.2 30.0 - 36.0 g/dL   RDW 17.0 (H) 11.5 - 15.5 %   Platelets 746 (H) 150 - 400 K/uL   nRBC 0.0 0.0 - 0.2 %   Neutrophils Relative % 73 %   Neutro Abs 9.8 (H) 1.7 - 7.7 K/uL   Lymphocytes Relative 18 %   Lymphs Abs 2.5 0.7 - 4.0 K/uL    Monocytes Relative 5 %   Monocytes Absolute 0.7 0.1 - 1.0 K/uL   Eosinophils Relative 3 %   Eosinophils Absolute 0.4 0.0 - 0.5 K/uL   Basophils Relative 0 %   Basophils Absolute 0.1 0.0 - 0.1 K/uL   Immature Granulocytes 1 %   Abs Immature Granulocytes 0.12 (H) 0.00 - 0.07 K/uL  Urinalysis, Routine w reflex microscopic Urine, Catheterized  Result Value Ref Range   Color, Urine AMBER (A) YELLOW   APPearance TURBID (A) CLEAR   Specific Gravity, Urine 1.017 1.005 - 1.030   pH 5.0 5.0 - 8.0   Glucose, UA NEGATIVE NEGATIVE mg/dL   Hgb urine dipstick LARGE (A) NEGATIVE   Bilirubin Urine NEGATIVE NEGATIVE   Ketones, ur 5 (A) NEGATIVE mg/dL   Protein, ur 100 (A) NEGATIVE mg/dL   Nitrite POSITIVE (A) NEGATIVE   Leukocytes,Ua LARGE (A) NEGATIVE   RBC / HPF >50 (H) 0 - 5 RBC/hpf   WBC, UA >50 (H) 0 - 5  WBC/hpf   Bacteria, UA FEW (A) NONE SEEN   WBC Clumps PRESENT    Budding Yeast PRESENT    Ca Oxalate Crys, UA PRESENT    CT ABDOMEN PELVIS WO CONTRAST  Result Date: 11/20/2019 CLINICAL DATA:  Abdominal pain and distension. EXAM: CT ABDOMEN AND PELVIS WITHOUT CONTRAST TECHNIQUE: Multidetector CT imaging of the abdomen and pelvis was performed following the standard protocol without IV contrast. COMPARISON:  CT 2 weeks ago 11/05/2019, pelvis MRI 01/25/2019 FINDINGS: Lower chest: Improved right but slightly worsening left pleural effusion. Adjacent atelectasis. High density pericardial fluid which has slightly diminished in volume from prior exam. There are coronary artery calcifications. Motion obscures detailed assessment. Hepatobiliary: No obvious focal liver abnormality, partially obscured by motion. Motion through the gallbladder without calcified gallstone. There is no biliary dilatation. Pancreas: Parenchymal atrophy. No ductal dilatation or inflammation. Spleen: Motion limited, no gross acute abnormality. Adrenals/Urinary Tract: Mild adrenal thickening without dominant nodule. Motion through  the kidneys. There is no hydronephrosis. Mild bilateral renal parenchymal atrophy. Suggestion of mild symmetric perinephric edema, motion obscured. No visualized renal calculi. Urinary bladder is decompressed by catheter. Stomach/Bowel: Similar appearance of gaseous distension and redundancy of the sigmoid colon. Large stool burden in the ascending, transverse, and proximal descending colon. Mixed liquid and solid stool within the rectum. No obvious rectal wall thickening. Administered enteric contrast reaches the distal small bowel. Inguinal canals are patulous with small bowel approaching but not entering. No small bowel obstruction. Partially distended stomach, primarily decompressed. Vascular/Lymphatic: Aorto bi-iliac atherosclerosis. Aortic branch atherosclerosis. Small left inguinal nodes largest measuring 9 mm short axis, unchanged. Reproductive: Brachytherapy seeds in the prostate gland. Other: No free air or ascites/free fluid. Small fat containing umbilical hernia. 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 Right hip arthroplasty. There is a small left hip joint effusion. Left hip osteoarthritis. Degenerative change in the lumbar spine with primarily facet hypertrophy. Enthesopathic change involving the hamstring insertions. No acute osseous abnormalities. IMPRESSION: 1. No acute abnormality in the abdomen/pelvis. 2. Large stool burden with gaseous distension and redundancy of the sigmoid colon, similar to prior exam. Mixed liquid and solid stool within the rectum without obvious rectal wall thickening. Overall findings consistent with constipation. There is no bowel obstruction. 3. Improved right but slightly worsening small left pleural effusion. High density pericardial fluid has slightly diminished in volume from prior exam. 4. Diminished right inguinal hernia, inguinal canal appears patulous on the current exam. No bowel involvement  currently. 5. Chronic osteomyelitis of the pubic symphysis. Aortic Atherosclerosis (ICD10-I70.0). Electronically Signed   By: Keith Rake M.D.   On: 11/20/2019 23:40   DG Chest 1 View  Result Date: 12/01/2019 CLINICAL DATA:  Altered mental status EXAM: CHEST  1 VIEW COMPARISON:  11/19/2019 FINDINGS: Cardiac shadow is within normal limits. Tortuous thoracic aorta is noted. The lungs are well aerated without focal infiltrate or sizable effusion. Degenerative changes of the thoracic spine are noted. IMPRESSION: No active disease. Electronically Signed   By: Inez Catalina M.D.   On: 12/01/2019 23:53   DG Abd 1 View  Result Date: 11/08/2019 CLINICAL DATA:  Follow-up for abdominal distension EXAM: ABDOMEN - 1 VIEW COMPARISON:  None. FINDINGS: Gaseous distension of the small bowel and colon. Moderate amount of stool throughout the colon. No evidence of pneumoperitoneum, portal venous gas or pneumatosis. No pathologic calcifications along the expected course of the ureters. No acute osseous abnormality. IMPRESSION: Gaseous distension of the small bowel and  colon as can be seen with an ileus. Electronically Signed   By: Kathreen Devoid   On: 11/08/2019 15:47   CT Head Wo Contrast  Result Date: 12/01/2019 CLINICAL DATA:  Altered mental status, slurred speech EXAM: CT HEAD WITHOUT CONTRAST TECHNIQUE: Contiguous axial images were obtained from the base of the skull through the vertex without intravenous contrast. COMPARISON:  MRI 08/20/2013 (images only) CT head 08/10/2013 (report only) FINDINGS: Brain: No evidence of acute infarction, hemorrhage, hydrocephalus, extra-axial collection, visible mass lesion or mass effect. Symmetric prominence of the ventricles, cisterns and sulci compatible with parenchymal volume loss. Patchy areas of white matter hypoattenuation are most compatible with chronic microvascular angiopathy. Vascular: Atherosclerotic calcification of the carotid siphons and intradural vertebral  arteries. No hyperdense vessel. Skull: No calvarial fracture or suspicious osseous lesion. No scalp swelling or hematoma. Sinuses/Orbits: Paranasal sinuses and mastoid air cells are predominantly clear. Orbital structures are unremarkable aside from prior lens extractions. Other: Debris in the right external auditory canal. IMPRESSION: 1. No acute intracranial findings. If there is persisting concern for acute infarction, MRI is more sensitive and specific for early features of ischemia. 2. Chronic microvascular angiopathy and parenchymal volume loss. 3. Debris in the right external auditory canal, correlate for cerumen impaction. Electronically Signed   By: Lovena Le M.D.   On: 12/01/2019 23:29   CT Abdomen Pelvis W Contrast  Result Date: 11/05/2019 CLINICAL DATA:  Bowel obstruction, pubic symphyseal osteomyelitis EXAM: CT ABDOMEN AND PELVIS WITH CONTRAST TECHNIQUE: Multidetector CT imaging of the abdomen and pelvis was performed using the standard protocol following bolus administration of intravenous contrast. CONTRAST:  148mL OMNIPAQUE IOHEXOL 300 MG/ML  SOLN COMPARISON:  None. FINDINGS: Lower chest: Small bilateral pleural effusions are present with associated mild bibasilar compressive atelectasis. Small pericardial effusion is present, new since prior examination, with associated pericardial enhancement suggesting a complex/exudative effusion. Cardiac size is within normal limits. No CT evidence of cardiac tamponade. Extensive distal right coronary artery calcification. Hepatobiliary: No focal liver abnormality is seen. No gallstones, gallbladder wall thickening, or biliary dilatation. Pancreas: Moderate atrophy, but otherwise unremarkable. Spleen: Unremarkable Adrenals/Urinary Tract: The adrenal glands are unremarkable. The kidneys are normal in size and position. Tiny exophytic cortical cyst within the upper pole of the left kidney. The kidneys are otherwise unremarkable. The bladder is decompressed  with a Foley catheter balloon seen within its lumen. Stomach/Bowel: Stomach and small bowel are unremarkable. The sigmoid colon is markedly redundant, however, there is no evidence of obstruction or focal inflammation. Moderate stool within the ascending and transverse colon. The appendix is not visualized and is likely absent. There is no free intraperitoneal gas or fluid. Vascular/Lymphatic: No pathologic adenopathy within the abdomen and pelvis. There is mild aortoiliac atherosclerotic calcification noted without evidence of aneurysm. Particularly prominent calcification, however, is seen at the origin of the mesenteric and renal vasculature, though the degree of stenosis is not well assessed on this non arteriographic study. Reproductive: Brachytherapy seeds are seen within the prostate gland. Other: Small right inguinal hernia is present containing portions of a distal small bowel and the cecum as well as a small amount of mesenteric fat. Tiny fat containing left inguinal hernia is present. Musculoskeletal: Right total hip arthroplasty has been performed. Moderate left hip degenerative arthritis. Mixed lytic and sclerotic changes involving the pubic symphyses are compatible with the given history of symphyseal osteomyelitis. Calcifications seen at the a hamstring origins likely relates to remote trauma or inflammation. Degenerative changes are seen within the lumbar  spine. No lytic or blastic bone lesions are seen. IMPRESSION: 1. Small pericardial effusion with associated pericardial enhancement suggesting a complex/exudative effusion. No CT evidence of cardiac tamponade. 2. Small bilateral pleural effusions. 3. Small right inguinal hernia containing portions of a distal small bowel and cecum. No evidence of bowel obstruction. 4. 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 Aortic Atherosclerosis (ICD10-I70.0). Electronically  Signed   By: Fidela Salisbury MD   On: 11/05/2019 15:44   MR FOOT LEFT W WO CONTRAST  Result Date: 11/08/2019 CLINICAL DATA:  Osteomyelitis of the foot. Plain foot soft tissue wound EXAM: MRI OF THE LEFT FOREFOOT WITHOUT AND WITH CONTRAST TECHNIQUE: Multiplanar, multisequence MR imaging of the left foot was performed both before and after administration of intravenous contrast. CONTRAST:  63mL GADAVIST GADOBUTROL 1 MMOL/ML IV SOLN COMPARISON:  None. FINDINGS: TENDONS Peroneal: Peroneal longus tendon intact. Peroneal brevis intact. Posteromedial: Posterior tibial tendon intact. Flexor hallucis longus tendon intact. Flexor digitorum longus tendon intact. Anterior: Tibialis anterior tendon intact. Extensor hallucis longus tendon intact Extensor digitorum longus tendon intact. Achilles:  Mild tendinosis of the distal Achilles tendon. Plantar Fascia: Thickening of the medial band of the plantar fascia at the calcaneal insertion as can be seen with mild plantar fasciitis. LIGAMENTS Lateral: Anterior talofibular ligament intact. Calcaneofibular ligament intact. Posterior talofibular ligament intact. Anterior and posterior tibiofibular ligaments intact. Medial: Deltoid ligament intact. Spring ligament intact. CARTILAGE Ankle Joint: No joint effusion. Normal ankle mortise. No chondral defect. Subtalar Joints/Sinus Tarsi: Normal subtalar joints. No subtalar joint effusion. Normal sinus tarsi. Bones/Soft Tissue: Soft tissue wound along the posterolateral aspect of the hindfoot overlying the posterior calcaneus. Underlying cortical destruction of the posterolateral calcaneus just inferior to the Achilles insertion with severe surrounding marrow edema most consistent with osteomyelitis. No drainable fluid collection. Soft tissue edema throughout the hindfoot extending into the ankle concerning for cellulitis. T2 hyperintense signal throughout the plantar musculature likely neurogenic. IMPRESSION: 1. Soft tissue wound along the  posterolateral aspect of the hindfoot overlying the posterior calcaneus. Underlying cortical destruction of the posterolateral calcaneus just inferior to the Achilles insertion with severe surrounding marrow edema most consistent with osteomyelitis with surrounding cellulitis. No drainable fluid collection. 2. Mild tendinosis of the distal Achilles tendon. 3. Thickening of the medial band of the plantar fascia at the calcaneal insertion as can be seen with mild plantar fasciitis. Electronically Signed   By: Kathreen Devoid   On: 11/08/2019 11:46   DG Chest Port 1 View  Result Date: 11/19/2019 CLINICAL DATA:  Fever. EXAM: PORTABLE CHEST 1 VIEW COMPARISON:  11/05/2019 FINDINGS: Borderline cardiomegaly. Unchanged mediastinal contours. Mild chronic elevation of left hemidiaphragm with adjacent atelectasis. Confluent consolidation. No pulmonary edema. No pneumothorax. No significant pleural effusion. Stable osseous structures with chronic degenerative change of the left shoulder. IMPRESSION: 1. No evidence of pneumonia.  Borderline cardiomegaly. 2. Chronic elevation of left hemidiaphragm with adjacent atelectasis. Electronically Signed   By: Keith Rake M.D.   On: 11/19/2019 17:53   DG Chest Port 1 View  Result Date: 11/05/2019 CLINICAL DATA:  New onset AFib EXAM: PORTABLE CHEST 1 VIEW COMPARISON:  June 09, 2019 FINDINGS: The cardiomediastinal silhouette is unchanged in contour when accounting for differences in technique.Trace fluid in the RIGHT minor fissure. No pleural effusion. No pneumothorax. No acute pleuroparenchymal abnormality. Gaseous distension of the colon. Multilevel degenerative changes of the thoracic spine. IMPRESSION: No acute cardiopulmonary abnormality. Electronically Signed   By: Colletta Maryland  Peacock MD   On: 11/05/2019 12:26   ECHOCARDIOGRAM COMPLETE  Result Date: 11/06/2019    ECHOCARDIOGRAM REPORT   Patient Name:   Anthony Dunlap Date of Exam: 11/06/2019 Medical Rec #:  [id number]       Height:       68.0 in Accession #:    [id number]     Weight:       170.0 lb Date of Birth:  March 09, 1933      BSA:          1.907 m Patient Age:    6 years       BP:           103/75 mmHg Patient Gender: M              HR:           78 bpm. Exam Location:  [facility] Procedure: 2D Echo, Cardiac Doppler and Color Doppler Indications:    Prostate Cancer  History:        Patient has prior history of Echocardiogram examinations, most                 recent 01/23/2018. Risk Factors:Dyslipidemia and Diabetes.                 Prostate Cancer.  Sonographer:    Alvino Chapel RCS Referring Phys: [id number] DAWOOD S [employer]  Sonographer Comments: Technically difficult study due to poor echo windows. IMPRESSIONS  1. Left ventricular ejection fraction, by estimation, is 60 to 65%. The left ventricle has normal function. Left ventricular endocardial border not optimally defined to evaluate regional wall motion. There is mild left ventricular hypertrophy. Left ventricular diastolic parameters are indeterminate.  2. Right ventricular systolic function is normal. The right ventricular size is normal.  3. Left atrial size was mildly dilated.  4. The pericardial effusion is circumferential.  5. The mitral valve is normal in structure. Trivial mitral valve regurgitation. No evidence of mitral stenosis.  6. The aortic valve was not well visualized. Aortic valve regurgitation is not visualized. No aortic stenosis is present. FINDINGS  Left Ventricle: Left ventricular ejection fraction, by estimation, is 60 to 65%. The left ventricle has normal function. Left ventricular endocardial border not optimally defined to evaluate regional wall motion. The left ventricular internal cavity size was normal in size. There is mild left ventricular hypertrophy. Left ventricular diastolic parameters are indeterminate. Right Ventricle: The right ventricular size is normal. No increase in right ventricular wall thickness. Right ventricular systolic function is  normal. Left Atrium: Left atrial size was mildly dilated. Right Atrium: Right atrial size was normal in size. Pericardium: Trivial pericardial effusion is present. The pericardial effusion is circumferential. Mitral Valve: The mitral valve is normal in structure. Trivial mitral valve regurgitation. No evidence of mitral valve stenosis. Tricuspid Valve: The tricuspid valve is normal in structure. Tricuspid valve regurgitation is not demonstrated. No evidence of tricuspid stenosis. Aortic Valve: The aortic valve was not well visualized. Aortic valve regurgitation is not visualized. No aortic stenosis is present. Aortic valve mean gradient measures 3.3 mmHg. Aortic valve peak gradient measures 7.2 mmHg. Aortic valve area, by VTI measures 2.80 cm. Pulmonic Valve: The pulmonic valve was not well visualized. Pulmonic valve regurgitation is not visualized. No evidence of pulmonic stenosis. Aorta: The aortic root is normal in size and structure. Pulmonary Artery: Indeterminant PASP, inadequate TR jet. Venous: The inferior vena cava was not well visualized. IAS/Shunts: The interatrial septum was not well visualized.  LEFT VENTRICLE PLAX 2D LVIDd:         4.18 cm LVIDs:         2.44 cm LV PW:         1.28 cm LV IVS:        1.12 cm LVOT diam:     2.00 cm LV SV:         56 LV SV Index:   29 LVOT Area:     3.14 cm  RIGHT VENTRICLE RV S prime:     13.00 cm/s TAPSE (M-mode): 1.4 cm LEFT ATRIUM             Index       RIGHT ATRIUM           Index LA diam:        2.60 cm 1.36 cm/m  RA Area:     15.90 cm LA Vol (A2C):   57.8 ml 30.30 ml/m RA Volume:   38.20 ml  20.03 ml/m LA Vol (A4C):   58.6 ml 30.72 ml/m LA Biplane Vol: 59.0 ml 30.93 ml/m  AORTIC VALVE AV Area (Vmax):    2.36 cm AV Area (Vmean):   2.29 cm AV Area (VTI):     2.80 cm AV Vmax:           134.16 cm/s AV Vmean:          83.194 cm/s AV VTI:            0.199 m AV Peak Grad:      7.2 mmHg AV Mean Grad:      3.3 mmHg LVOT Vmax:         100.60 cm/s LVOT Vmean:         60.700 cm/s LVOT VTI:          0.178 m LVOT/AV VTI ratio: 0.89  AORTA Ao Root diam: 3.00 cm MITRAL VALVE                TRICUSPID VALVE MV Area (PHT): 3.61 cm     TR Peak grad:   22.3 mmHg MV Decel Time: 210 msec     TR Vmax:        236.00 cm/s MV E velocity: 123.00 cm/s                             SHUNTS                             Systemic VTI:  0.18 m                             Systemic Diam: 2.00 cm Carlyle Dolly MD Electronically signed by Carlyle Dolly MD Signature Date/Time: 11/06/2019/11:30:42 AM    Final       Delora Fuel, MD 13/08/65 [phone]

## 2019-12-02 NOTE — H&P (Addendum)
History and Physical  Anthony Dunlap:[id number] DOB: 21-Aug-1933 DOA: 12/01/2019  Referring physician: Delora Fuel, MD PCP: [name], MD  Patient coming from: Home  Chief Complaint: Altered mental status  HPI: Anthony Dunlap is a 86 y.o. 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  Patient was unable to provide details of why he came to the hospital, he kept saying " I felt sick" without being able to expantiate on this.  History was obtained from ED physician and ED medical record.  Per report, patient was reported to have slurred speech, decreased appetite and not acting himself, patient was also said to have left-sided weakness.  He has chronic indwelling catheter and was recently admitted and discharged from [facility] from 9/16-10/7 due to acute osteomyelitis of left calcaneus status post left BKA.  ED Course:  In the emergency department, he was hemodynamically stable.  Work-up in the ED showed leukocytosis, thrombocytosis, normocytic anemia, hyperglycemia, BUN to creatinine 21/1.27 (baseline creatinine at 0.95-0.99.)  Albumin 2.9, ALP 185, urinalysis was positive for large hemoglobin, positive nitrite, large leukocytes, RBC and WBC > 50, bacteria UA few.  Respiratory panel for influenza A and B and SARS coronavirus 2 was negative.  Chest x-ray showed no active disease.  CT head without contrast showed no acute intracranial findings.  IV hydration 500 mL was provided and patient was treated with IV ceftriaxone.  Teleneurology was consulted and recommend further stroke work-up in the morning.  Hospitalist was asked to admit patient for further evaluation and management.  Review of Systems: This cannot be obtained at this time due to patient's current condition.   Past  Medical History:  Diagnosis Date  . Chronic heel ulcer, left, limited to breakdown of skin ([place]) 10/28/2019  . 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 Diabetes ([place])   . Drug-induced hepatitis 06/05/2018  . Inguinal hernia    06/09/2019: per patient " a long time ago on the right side"  . Left eye pain   . Localized osteoarthrosis of right hip 01/05/2019  . Prostate cancer [facility])    Past Surgical History:  Procedure Laterality Date  . ABDOMINAL AORTOGRAM W/LOWER EXTREMITY Bilateral 08/12/2019   Procedure: ABDOMINAL AORTOGRAM W/LOWER EXTREMITY;  Surgeon: Marty Heck, MD;  Location: [place] CV LAB;  Service: Cardiovascular;  Laterality: Bilateral;  . ABDOMINAL AORTOGRAM W/LOWER EXTREMITY Left 08/19/2019   Procedure: ABDOMINAL AORTOGRAM W/LOWER EXTREMITY;  Surgeon: Marty Heck, MD;  Location: [place] [facility];  Service: Cardiovascular;  Laterality: Left;  . AMPUTATION Right 09/28/2019   Procedure: AMPUTATION BELOW KNEE;  Surgeon: Rosetta Posner, MD;  Location: [place];  Service: Vascular;  Laterality: Right;  . AMPUTATION Left 11/11/2019   Procedure: AMPUTATION BELOW KNEE;  Surgeon: Rosetta Posner, MD;  Location[facility];  Service: Vascular;  Laterality: Left;  . ESOPHAGOGASTRODUODENOSCOPY (EGD) WITH PROPOFOL N/A 11/09/2019   Procedure: ESOPHAGOGASTRODUODENOSCOPY (EGD) WITH PROPOFOL;  Surgeon: Lavena Bullion, DO;  Location: [place];  Service: Gastroenterology;  Laterality: N/A;  . HERNIA REPAIR    . IR RADIOLOGIST EVAL & MGMT  03/06/2018  . PERIPHERAL VASCULAR BALLOON ANGIOPLASTY Left 08/19/2019   Procedure: PERIPHERAL VASCULAR BALLOON ANGIOPLASTY;  Surgeon: Marty Heck, MD;  Location: [place] CV LAB;  Service: Cardiovascular;  Laterality: Left;  .  PROSTATE SURGERY    . TOTAL HIP ARTHROPLASTY Right 06/12/2019   Procedure: RIGHT TOTAL HIP ARTHROPLASTY DIRECT ANTERIOR;  Surgeon: Marybelle Killings, MD;  Location: [place];  Service:  Orthopedics;  Laterality: Right;    Social History:  reports that he has never smoked. He has never used smokeless tobacco. He reports that he does not drink alcohol and does not use drugs.   Allergies  Allergen Reactions  . Fluconazole Other (See Comments)    Drug-induced hepatitis    Family History  Problem Relation Age of Onset  . Pneumonia Father   . Cancer Sister      Prior to Admission medications   Medication Sig Start Date End Date Taking? Authorizing Provider  acetaminophen (TYLENOL) 500 MG tablet Take 1,000 mg by mouth every 6 (six) hours as needed for mild pain or moderate pain.    [provider]  ALPRAZolam Duanne Moron) 0.5 MG tablet Take 1 tablet (0.5 mg total) by mouth 2 (two) times daily as needed for anxiety. 09/30/19   Geradine Girt, DO  amiodarone (PACERONE) 200 MG tablet Take 1 tablet (200 mg total) by mouth 2 (two) times daily for 14 days. Amiodarone 200mg  BID for 14 days and then once daily. 11/26/19 12/10/19  Bunnie Pion Z, DO  amitriptyline (ELAVIL) 10 MG tablet Take 20 mg by mouth at bedtime.  11/28/18   [provider]  apixaban (ELIQUIS) 5 MG TABS tablet Take 1 tablet (5 mg total) by mouth 2 (two) times daily. 11/15/19   Antonieta Pert, MD  clopidogrel (PLAVIX) 75 MG tablet Take 1 tablet (75 mg total) by mouth daily. Patient not taking: Reported on 10/28/2019 08/19/19 08/18/20  Marty Heck, MD  colchicine 0.6 MG tablet Take 0.6 mg by mouth daily as needed (gout attacks).  07/27/19   [provider]  diltiazem (CARDIZEM CD) 240 MG 24 hr capsule Take 1 capsule (240 mg total) by mouth daily. 11/14/19   Antonieta Pert, MD  diltiazem (CARDIZEM CD) 300 MG 24 hr capsule Take 1 capsule (300 mg total) by mouth daily. 11/27/19   Bunnie Pion Z, DO  ferrous sulfate 325 (65 FE) MG tablet Take 1 tablet (325 mg total) by mouth 2 (two) times daily with a meal. 09/30/19   Vann, Jessica U, DO  insulin aspart (NOVOLOG) 100 UNIT/ML injection Inject 0-15 Units  into the skin 3 (three) times daily with meals. Sliding scale insulin Less than 70 initiate hypoglycemia protocol 70-120  0 units 120-150 2 unit 151-200 3 units 201-250 3 units 251-300 5 units 301-350 8 units 351-400 11 units  Greater than 400 15 units , call MD Patient taking differently: Inject 0-16 Units into the skin See admin instructions. Inject 0-16 units subcutaneously three times daily with meals per sliding scale: CBG 70-120 0 units 120-150 4 units, 151-200 5 units, 201-250 6 units, 251-300 7 units, 301-350 10 units, 351-400 13 units, >400 16 units and call MD (Less than 70 initiate hypoglycemia protocol) 02/18/18   Oswald Hillock, MD  insulin glargine (LANTUS) 100 UNIT/ML injection Inject 0.04 mLs (4 Units total) into the skin at bedtime. 11/13/19   Antonieta Pert, MD  insulin glargine (LANTUS) 100 UNIT/ML injection Inject 0.06 mLs (6 Units total) into the skin at bedtime. 11/17/19 12/17/19  Antonieta Pert, MD  levothyroxine (SYNTHROID, LEVOTHROID) 100 MCG tablet Take 100 mcg by mouth daily with breakfast.  04/18/18   [provider]  metoprolol succinate (TOPROL-XL) 100 MG 24 hr tablet Take  1 tablet (100 mg total) by mouth daily. Take with or immediately following a meal. 11/27/19   Bunnie Pion Z, DO  metoprolol succinate (TOPROL-XL) 50 MG 24 hr tablet Take 1 tablet (50 mg total) by mouth daily. Take with or immediately following a meal. 11/14/19   Antonieta Pert, MD  Multiple Vitamin (MULTIVITAMIN WITH MINERALS) TABS tablet Take 1 tablet by mouth daily. 09/30/19   Geradine Girt, DO  nutrition supplement, JUVEN, (JUVEN) PACK Take 1 packet by mouth 2 (two) times daily between meals. 09/30/19   Geradine Girt, DO  Nutritional Supplements (,FEEDING SUPPLEMENT, PROSOURCE PLUS) liquid Take 30 mLs by mouth 2 (two) times daily with a meal. 09/30/19   Geradine Girt, DO  nystatin (MYCOSTATIN/NYSTOP) powder Apply topically 4 (four) times daily. Patient taking differently: Apply 1 application  topically daily as needed (yeast).  03/03/19   Truman Hayward, MD  oxybutynin (DITROPAN-XL) 5 MG 24 hr tablet Take 1 tablet (5 mg total) by mouth at bedtime. 11/13/19   Antonieta Pert, MD  oxyCODONE (OXY IR/ROXICODONE) 5 MG immediate release tablet Take 1 tablet (5 mg total) by mouth every 4 (four) hours as needed for moderate pain. 09/30/19   Geradine Girt, DO  pantoprazole (PROTONIX) 40 MG tablet Take 1 tablet (40 mg total) by mouth daily. 09/30/19   Geradine Girt, DO  pravastatin (PRAVACHOL) 10 MG tablet Take 10 mg by mouth daily with supper.     [provider]  senna-docusate (SENOKOT-S) 8.6-50 MG tablet Take 1 tablet by mouth 2 (two) times daily. 09/30/19   Geradine Girt, DO  silver sulfADIAZINE (SILVADENE) 1 % cream Apply topically 2 (two) times daily. 09/30/19   Geradine Girt, DO  tobramycin (TOBREX) 0.3 % ophthalmic solution Place 2 drops into both eyes every 4 (four) hours while awake. 11/13/19   Antonieta Pert, MD    Physical Exam: BP 118/75   Pulse 80   Temp 98.2 F (36.8 C) (Rectal)   Resp (!) 24   Ht 5\' 8"  (1.727 m)   Wt 65.9 kg   SpO2 98%   BMI 22.09 kg/m   . General: 86 y.o. year-old male well developed well nourished in no acute distress.  Alert and oriented x3. [name] HEENT: NCAT, EOMI . Neck: Supple, trachea medial . Cardiovascular: Regular rate and rhythm with no rubs or gallops.  No thyromegaly or JVD noted.  No lower extremity edema. 2/4 pulses in all 4 extremities. [name] Respiratory: Clear to auscultation with no wheezes or rales. Good inspiratory effort. . Abdomen: Soft nontender nondistended with normal bowel sounds x4 quadrants. . Muskuloskeletal: Bilateral BKA with staples in the left stump and no drainage.  No weakness noted in upper extremities on exam.  No cyanosis, clubbing or edema noted bilaterally . Neuro: CN II-XII intact, strength, sensation, reflexes intact, NIHSS 2 (Ask Month and Age - 1 Question Right + 1; dysarthria +1) . Skin: Stage I presacral  pressure breakdown noted.  [name] Psychiatry: Judgement and insight appear normal. Mood is appropriate for condition and setting          Labs on Admission:  Basic Metabolic Panel: Recent Labs  Lab 12/01/19 2238  NA 134*  K 4.6  CL 100  CO2 24  GLUCOSE 261*  BUN 21  CREATININE 1.27*  CALCIUM 8.7*   Liver Function Tests: Recent Labs  Lab 12/01/19 2238  AST 14*  ALT 14  ALKPHOS 185*  BILITOT 0.4  PROT 7.6  ALBUMIN 2.9*   No results for input(s): LIPASE, AMYLASE in the last 168 hours. No results for input(s): AMMONIA in the last 168 hours. CBC: Recent Labs  Lab 12/01/19 2238  WBC 13.5*  NEUTROABS 9.8*  HGB 9.9*  HCT 32.8*  MCV 84.1  PLT 746*   Cardiac Enzymes: No results for input(s): CKTOTAL, CKMB, CKMBINDEX, TROPONINI in the last 168 hours.  BNP (last 3 results) No results for input(s): BNP in the last 8760 hours.  ProBNP (last 3 results) No results for input(s): PROBNP in the last 8760 hours.  CBG: Recent Labs  Lab 11/25/19 1644 11/25/19 2227 11/26/19 0820 11/26/19 1155 11/26/19 1606  GLUCAP 249* 184* 187* 238* 216*    Radiological Exams on Admission: DG Chest 1 View  Result Date: 12/01/2019 CLINICAL DATA:  Altered mental status EXAM: CHEST  1 VIEW COMPARISON:  11/19/2019 FINDINGS: Cardiac shadow is within normal limits. Tortuous thoracic aorta is noted. The lungs are well aerated without focal infiltrate or sizable effusion. Degenerative changes of the thoracic spine are noted. IMPRESSION: No active disease. Electronically Signed   By: Inez Catalina M.D.   On: 12/01/2019 23:53   CT Head Wo Contrast  Result Date: 12/01/2019 CLINICAL DATA:  Altered mental status, slurred speech EXAM: CT HEAD WITHOUT CONTRAST TECHNIQUE: Contiguous axial images were obtained from the base of the skull through the vertex without intravenous contrast. COMPARISON:  MRI 08/20/2013 (images only) CT head 08/10/2013 (report only) FINDINGS: Brain: No evidence of acute  infarction, hemorrhage, hydrocephalus, extra-axial collection, visible mass lesion or mass effect. Symmetric prominence of the ventricles, cisterns and sulci compatible with parenchymal volume loss. Patchy areas of white matter hypoattenuation are most compatible with chronic microvascular angiopathy. Vascular: Atherosclerotic calcification of the carotid siphons and intradural vertebral arteries. No hyperdense vessel. Skull: No calvarial fracture or suspicious osseous lesion. No scalp swelling or hematoma. Sinuses/Orbits: Paranasal sinuses and mastoid air cells are predominantly clear. Orbital structures are unremarkable aside from prior lens extractions. Other: Debris in the right external auditory canal. IMPRESSION: 1. No acute intracranial findings. If there is persisting concern for acute infarction, MRI is more sensitive and specific for early features of ischemia. 2. Chronic microvascular angiopathy and parenchymal volume loss. 3. Debris in the right external auditory canal, correlate for cerumen impaction. Electronically Signed   By: Lovena Le M.D.   On: 12/01/2019 23:29    EKG: I independently viewed the EKG done and my findings are as followed: Sinus rhythm at rate of 82 bpm with prolonged QTc at 425ms.  Assessment/Plan Present on Admission: . Acute metabolic encephalopathy . HLD (hyperlipidemia) . Hypothyroidism . UTI (urinary tract infection) . Elevated alkaline phosphatase level . Atrial fibrillation with RVR ([name])  Principal Problem:   Acute metabolic encephalopathy Active Problems:   Type 2 diabetes mellitus ([name])   HLD (hyperlipidemia)   Elevated alkaline phosphatase level   Hypothyroidism   UTI (urinary tract infection)   Atrial fibrillation with RVR ([name])   Decubitus ulcer of sacral region, stage 1   Hyperglycemia due to diabetes mellitus ([name])   Leukocytosis   Thrombocytosis   Hypoalbuminemia   AKI (acute kidney injury) ([place])   Prolonged QT interval   Acute  metabolic encephalopathy secondary to multifactorial r/o acute ischemic stroke vs infectious process  Patient will be admitted to telemetry unit  Bilateral carotid ultrasound in the morning MRI of brain without contrast in the morning Continue aspirin and statin Continue fall precautions and neuro checks Lipid panel and hemoglobin A1c  will be checked Continue PT/ST/OT eval and treat Bedside swallow eval by nursing prior to diet Echocardiogram done on 9/17 showed Left ventricular ejection fraction, by estimation, is 60 to 65%. The left ventricle has normal function.  Limited echocardiogram will be done in the morning Consider tele neurology consult status post imaging studies  Leukocytosis possibly reactive vs infective Presumed CAUTI POA  Patient has chronic indwelling catheter, urinalysis was positive for UTI Patient was started on IV ceftriaxone, we shall continue with same at this time with plan to de-escalate based on urine culture Urology will be consulted to help to change catheter  Thrombocytosis (chronic) Platelets 746; stable Continue to monitor platelet level with morning labs  Hyperglycemia secondary to diabetes mellitus Continue insulin sliding scale and hypoglycemia protocol  Acute kidney injury BUN/Cr  21/1.27 (baseline creatinine at 0.95-0.99. Renally adjust medications, avoid nephrotoxic agents/dehydration/hypotension  Hypoalbuminemia possibly secondary to moderate protein calorie malnutrition Albumin 2.9; protein supplement will be provided  Sacral decubitus ulcer stage I Continue wound care Continue frequent turning of patient  Elevated ALP ALP 185, continue to monitor liver enzymes  Atrial fibrillation with RVR Continue amiodarone per home regimen Diltiazem and metoprolol will be held at this time due to permissive hypertension EKG currently in sinus rhythm  Prolonged QT interval QTc 471ms Avoid QT prolonging drugs K+ is 4.6 Magnesium level will be  checked  Hyperlipidemia Continue pravastatin  Hypothyroidism Continue Synthroid  GERD Continue Protonix   DVT prophylaxis: SCDs (chemoprophylaxis temporarily held due to suspicious acute ischemic stroke)  Code Status: Full code  Family Communication: None at bedside  Disposition Plan:  Patient is from:                        home Anticipated DC to:                   SNF or family members home Anticipated DC date:                1 day Anticipated DC barriers:           Patient is not stable to be discharged at this time due to acute metabolic neuropathy secondary to possible acute ischemic stroke/infection requiring further work-up and treatment.  Consults called: Neurology, urology  Admission status: Observation    Bernadette Hoit MD [facility]  12/02/2019, 5:05 AM

## 2019-12-02 NOTE — Plan of Care (Signed)
°  Problem: Acute Rehab PT Goals(only PT should resolve) Goal: Pt Will Go Supine/Side To Sit Outcome: Progressing Flowsheets (Taken 12/02/2019 1058) Pt will go Supine/Side to Sit: with minimal assist   Problem: Acute Rehab PT Goals(only PT should resolve) Goal: Pt Will Transfer Bed To Chair/Chair To Bed Flowsheets (Taken 12/02/2019 1059) Pt will Transfer Bed to Chair/Chair to Bed: with mod assist Note: Using sliding board   Problem: Acute Rehab PT Goals(only PT should resolve) Goal: Patient Will Perform Sitting Balance Flowsheets (Taken 12/02/2019 1059) Patient will perform sitting balance:  with min guard assist  with minimal assist   11:00 AM, 12/02/19 Anthony Dunlap, MPT Physical Therapist with [facility] [phone] office [phone] mobile phone

## 2019-12-02 NOTE — TOC Initial Note (Signed)
Transition of Care [facility]) - Initial/Assessment Note   Patient Details  Name: Anthony Dunlap MRN: [id number] Date of Birth: [birth year]  Transition of Care [facility]) CM/SW Contact:    Sherie Don, LCSW Phone Number: 12/02/2019, 2:58 PM  Clinical Narrative: Patient is an 86 year old male who was admitted for acute metabolic encephalopathy. Patient has a history of type 2 DM, HLD, hypothyroidism, atrial fibrillation with RVR, and AKI. Assessment completed with patient's daughter, [name], due to patient's AMS. PT and OT evaluations recommend SNF, but patient has not had 60 days of wellness since previous SNF admission. Per daughter, patient will not want to return to a SNF at this time and would prefer to discharge home with [facility]. Daughter reported patient is active with [place]. CSW followed up with Georgina Snell at [place]. TOC to follow.  Expected Discharge Plan: [place] Barriers to Discharge: Continued Medical Work up  Patient Goals and CMS Choice Patient states their goals for this hospitalization and ongoing recovery are:: Discharge home with [facility] CMS Medicare.gov Compare Post Acute Care list provided to:: Patient Represenative (must comment) [name] (daughter)) Choice offered to / list presented to : Adult Children  Expected Discharge Plan and Services Expected Discharge Plan: [place] In-house Referral: Clinical Social Work Discharge Planning Services: NA Post Acute Care Choice: [place] arrangements for the past 2 months: [place] Arranged: RN, PT, OT HH Agency: [place] Date [place]: 12/02/19 Time [place]: [id number] Representative spoke with at [place]: Georgina Snell  Prior Living Arrangements/Services Living arrangements for the past 2 months: [place] with:: Adult Children Patient language and need for interpreter reviewed:: Yes Do you feel safe going back to the place  where you live?: Yes      Need for Family Participation in Patient Care: Yes (Comment) (Patient has altered mental status) Care giver support system in place?: Yes (comment) Current home services: DME, Home OT, Home PT, Home RN (Hospital bed, walker, 3N1, wheelchair) Criminal Activity/Legal Involvement Pertinent to Current Situation/Hospitalization: No - Comment as needed  Activities of Daily Living Home Assistive Devices/Equipment: Bedside commode/3-in-1 ADL Screening (condition at time of admission) Patient's cognitive ability adequate to safely complete daily activities?: Yes Is the patient deaf or have difficulty hearing?: No Does the patient have difficulty seeing, even when wearing glasses/contacts?: No Does the patient have difficulty concentrating, remembering, or making decisions?: No Patient able to express need for assistance with ADLs?: Yes Does the patient have difficulty dressing or bathing?: Yes Independently performs ADLs?: No Does the patient have difficulty walking or climbing stairs?: Yes Weakness of Legs: Both Weakness of Arms/Hands: None  Permission Sought/Granted Permission sought to share information with : Customer service manager, Family Supports Permission granted to share information with : Yes, Verbal Permission Granted Permission granted to share info w AGENCY: [place]  Emotional Assessment Appearance:: Appears stated age Orientation: : Oriented to Self, Oriented to Place Alcohol / Substance Use: Not Applicable Psych Involvement: No (comment)  Admission diagnosis:  Slurred speech [R47.81] Hyperglycemia [R73.9] Stroke ([place]) [I63.9] Urinary tract infection without hematuria, site unspecified [V56.4] Acute metabolic encephalopathy [P32.95] Pressure injury of buttock, stage 1, unspecified laterality [L89.301] Sepsis secondary to UTI ([place]) [A41.9, N39.0] Patient Active Problem List   Diagnosis Date Noted  . Acute metabolic encephalopathy  18/84/1660  . Decubitus ulcer of sacral region, stage 1 12/02/2019  . Hyperglycemia due to diabetes mellitus ([place]) 12/02/2019  .  Leukocytosis 12/02/2019  . Thrombocytosis 12/02/2019  . Hypoalbuminemia 12/02/2019  . AKI (acute kidney injury) ([place]) 12/02/2019  . Prolonged QT interval 12/02/2019  . Sepsis secondary to UTI ([place]) 12/02/2019  . Typical atrial flutter ([place])   . Atrial fibrillation with RVR ([place])   . Acute osteomyelitis of left calcaneus ([name])   . Sepsis ([place]) 11/05/2019  . Chronic heel ulcer, left, limited to breakdown of skin ([place]) 10/28/2019  . Acute osteomyelitis of calcaneum, right ([place]) 09/25/2019  . Critical lower limb ischemia ([place]) 08/11/2019  . Hx of total hip arthroplasty, right 08/06/2019  . Arthritis of right hip 06/12/2019  . Pelvic abscess in male [facility]) 09/11/2018  . Drug-induced hepatitis 06/05/2018  . Calculus of gallbladder without cholecystitis without obstruction   . Fungemia/Candida albicans 05/10/2018  . UTI (urinary tract infection) 05/08/2018  . Complicated UTI (urinary tract infection) 05/08/2018  . 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 Abscess of left thigh   . Pressure ulcer 02/10/2018  . Enterococcus faecalis infection 01/27/2018  . Abscess   . Psoas abscess ([place]) 01/23/2018  . Sepsis due to Bacteroides species ([place]) 01/22/2018  . Sepsis without acute organ dysfunction ([place])   . DKA (diabetic ketoacidoses) 01/17/2018  . Normocytic anemia 01/17/2018  . Chronic anemia 01/03/2018  . Chronic indwelling Foley catheter 01/03/2018  . Elevated alkaline phosphatase level 01/03/2018  . Hypothyroidism 01/03/2018  . Physical deconditioning 01/03/2018  . 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 HLD (hyperlipidemia) 08/19/2013  . Type 2 diabetes mellitus ([place])   . Prostate cancer [facility])    PCP:  [name], MD Pharmacy:   [facility], [address] AT [address] [place] [address] Phone: [phone] Fax: [phone]  Readmission Risk Interventions Readmission Risk Prevention Plan 11/13/2019 09/29/2019 05/09/2018  Transportation Screening Complete Complete Complete  PCP or Specialist Appt within 5-7 Days - Not Complete -  Not Complete comments - plan for SNF -  PCP or Specialist Appt within 3-5 Days Complete - Complete  Home Care Screening - Complete -  Medication Review (RN CM) - Referral to [place] or Home Care Consult Complete - Complete  Social Work Consult for [place] Planning/Counseling Complete - Complete  Palliative Care Screening Not Applicable - Not Applicable  Medication Review Press photographer) Complete - Complete  Some recent data might be hidden

## 2019-12-02 NOTE — Consult Note (Signed)
[facility] TeleNeurology Consult Services  Stat Consult  Date of Service:   12/02/2019 00:40:18  Impression:     .  G93.49 - Encephalopathy Multifactorial  Comments/Sign-Out: the patient is globally weak and his speech is garbled. Do not appreciate any one-sided weakness as he actually left his right arm dropped before the left on repeat exam. His speech is garbled but he is also extremely lethargic. Certainly new ischemic stroke is a possibility however not in alteplase candidate and not an neuro-interventional candidate. He can get an inpatient MRI. He may also have urinary tract infection his right stump also appears somewhat red and erythematous. Toxic metabolic workup underway. For now would hold full anticoagulation and bridge with antiplatelet therapy and admitted for toxic metabolic and possible stroke workup.  CT HEAD: Showed No Acute Hemorrhage or Acute Core Infarct  Our recommendations are outlined below.  Diagnostic Studies: Recommend MRI brain without contrast  Laboratory Studies: Recommend Lipid panel Hemoglobin A1c  Medication: Antiplatelet Therapy recommended  Nursing Recommendations: Telemetry, IV Fluids, avoid dextrose containing fluids, Maintain euglycemia Neuro checks q4 hrs x 24 hrs and then per shift Head of bed 30 degrees  Consultations: Recommend Speech therapy if failed dysphagia screen Physical therapy/Occupational therapy  DVT Prophylaxis: Choice of Primary Team  Disposition: Neurology will follow  Additional Recommendations:    Metrics: [facility] Notification Time: 12/02/2019 00:38:15 Stamp Time: 12/02/2019 00:40:18 Callback Response Time: 12/02/2019 00:42:06   ----------------------------------------------------------------------------------------------------  Chief Complaint: dysarthria 2.  History of Present Illness: Patient is a 86 year old Male.  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 He presents with worsening mental status alert each and a report of left-sided weakness. He is on full anticoagulation with Eliquis.   Past Medical History:     . Diabetes Mellitus     . Hyperlipidemia     . Atrial Fibrillation     . There is NO history of Coronary Artery Disease  Anticoagulant use:  eliquis     Examination: BP(p), Pulse(p), Blood Glucose(261) 1A: Level of Consciousness - Alert; keenly responsive + 0 1B: Ask Month and Age - 1 Question Right + 1 1C: Blink Eyes & Squeeze Hands - Performs Both Tasks + 0 2: Test Horizontal Extraocular Movements - Normal + 0 3: Test Visual Fields - No Visual Loss + 0 4: Test Facial Palsy (Use Grimace if Obtunded) - Normal symmetry + 0 5A: Test Left Arm Motor Drift - No Drift for 10 Seconds + 0 5B: Test Right Arm Motor Drift - No Drift for 10 Seconds + 0 6A: Test Left Leg Motor Drift - No Drift for 5 Seconds + 0 6B: Test Right Leg Motor Drift - No Drift for 5 Seconds + 0 7: Test Limb Ataxia (FNF/Heel-Shin) - No Ataxia + 0 8: Test Sensation - Normal; No sensory loss + 0 9: Test Language/Aphasia - Mild-Moderate Aphasia: Some Obvious Changes, Without Significant Limitation + 1 10: Test Dysarthria - Severe Dysarthria: Unintelligble Slurring or Out of Proportion to Aphasia + 2 11: Test Extinction/Inattention - No abnormality + 0  NIHSS Score: 4   Patient/Family was informed the Neurology Consult would occur via TeleHealth consult by way of interactive audio and video telecommunications and consented to receiving care in this manner.  Patient is being evaluated for possible acute neurologic impairment and high probability of  imminent or life-threatening deterioration. I spent total of 35 minutes providing care to this patient, including time for face to face visit via telemedicine,  review of medical records, imaging studies and discussion of findings with providers, the patient and/or family.   Dr Katina Degree   [facility] [phone]  Case [id number]

## 2019-12-02 NOTE — Progress Notes (Signed)
[facility] for apixaban Indication: atrial fibrillation  Allergies  Allergen Reactions  . Fluconazole Other (See Comments)    Drug-induced hepatitis    Patient Measurements: Height: 5\' 8"  (172.7 cm) Weight: 65.9 kg (145 lb 4.5 oz) IBW/kg (Calculated) : 68.4  Vital Signs: Temp: 97.8 F (36.6 C) (10/13 1332) Temp Source: Oral (10/13 1332) BP: 146/86 (10/13 1332) Pulse Rate: 92 (10/13 1332)  Labs: Recent Labs    12/01/19 2238 12/02/19 0510  HGB 9.9* 9.5*  HCT 32.8* 31.8*  PLT 746* 684*  APTT  --  45*  LABPROT  --  17.3*  INR  --  1.5*  CREATININE 1.27* 1.05    Estimated Creatinine Clearance: 48.8 mL/min (by C-G formula based on SCr of 1.05 mg/dL).   Medical History: Past Medical History:  Diagnosis Date  . Chronic heel ulcer, left, limited to breakdown of skin ([place]) 10/28/2019  . 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 ([place]) 04/21/2018  . Diabetes ([place])   . Drug-induced hepatitis 06/05/2018  . Inguinal hernia    06/09/2019: per patient " a long time ago on the right side"  . Left eye pain   . Localized osteoarthrosis of right hip 01/05/2019  . Prostate cancer [facility])      Assessment: 41 yoM with hx AFib on apixaban PTA admitted with AMS. Pharmacy asked to restart home apixaban 5mg  BID - dose ok with wt >60k and SCr <1.5.  Goal of Therapy:  Monitor platelets by anticoagulation protocol: Yes   Plan:  Apixaban 5mg  BID Pharmacy will sign off, reconsult if needed   Arrie Senate, PharmD, BCPS Clinical Pharmacist Please check AMION for all [place] numbers 12/02/2019

## 2019-12-02 NOTE — Plan of Care (Signed)
  Problem: Acute Rehab OT Goals (only OT should resolve) Goal: Pt. Will Perform Eating Flowsheets (Taken 12/02/2019 0826) Pt Will Perform Eating:  with set-up  sitting Goal: Pt. Will Perform Grooming Flowsheets (Taken 12/02/2019 0826) Pt Will Perform Grooming:  with set-up  sitting Goal: Pt. Will Perform Upper Body Dressing Flowsheets (Taken 12/02/2019 0826) Pt Will Perform Upper Body Dressing:  with supervision  sitting Goal: Pt/Caregiver Will Perform Home Exercise Program Flowsheets (Taken 12/02/2019 [phone]) Pt/caregiver will Perform Home Exercise Program:  Increased strength  Both right and left upper extremity  With Supervision  With written HEP provided

## 2019-12-02 NOTE — Evaluation (Signed)
Occupational Therapy Evaluation Patient Details Name: Anthony Dunlap MRN: [id number] DOB: 1933/11/02 Today's Date: 12/02/2019    History of Present Illness Anthony Dunlap is a 86 y.o. 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  Patient was unable to provide details of why he came to the hospital, he kept saying " I felt sick" without being able to expantiate on this. Per report, patient was reported to have slurred speech, decreased appetite and not acting himself, patient was also said to have left-sided weakness.  He has chronic indwelling catheter and was recently admitted and discharged from [facility] from 9/16-10/7 due to acute osteomyelitis of left calcaneus status post left BKA.   Clinical Impression   Pt pleasant and agreeable to OT evaluation this am. Pt somewhat confused, stating he is at home and his wife is there with him, however later in session informed OT that his wife recently passed away. Pt recently discharged from [facility] s/p BKA, pt with bilateral BKAs, reports using a slide board for transfers and family assists with ADLs. Pt with generalized weakness and mild coordination deficits in BUE requiring increased assistance for ADL completion and mobility tasks. Recommend SNF on discharge to improve pt safety and independence during ADL tasks. Will continue to follow while in acute care.     Follow Up Recommendations  SNF    Equipment Recommendations  None recommended by OT       Precautions / Restrictions Precautions Precautions: Fall Restrictions Weight Bearing Restrictions: Yes RLE Weight Bearing: Non weight bearing LLE Weight Bearing: Non weight bearing      Mobility Bed Mobility               General bed mobility comments: Defer to PT  note  Transfers                 General transfer comment: Not completed         ADL either performed or assessed with clinical judgement   ADL Overall ADL's : Needs assistance/impaired     Grooming: Wash/dry hands;Wash/dry face;Set up;Sitting           Upper Body Dressing : Minimal assistance;Bed level Upper Body Dressing Details (indicate cue type and reason): donned gown prior to performing bed mobility, assist for managing gown ties and snaps                   General ADL Comments: Pt able to assist with scooting up in bed by reaching for bedrail at [facility] and pulling. Cuing for utilizing rails when coming to sit at EOB, has posterior lean while seated at EOB.      Vision Baseline Vision/History: No visual deficits Patient Visual Report: No change from baseline Vision Assessment?: No apparent visual deficits            Pertinent Vitals/Pain Pain Assessment: No/denies pain     Hand Dominance Right   Extremity/Trunk Assessment Upper Extremity Assessment RUE Deficits / Details: decreased grasp, tremor noted, Pt unable to perform supination and limited FF from shoulder, movements ataxic looking RUE Coordination: decreased gross motor;decreased fine motor LUE Deficits / Details: decreased FF, noticeable tremor, generalized weakness.  Min assist for reaching back to grab railing of bed to pull himself up in bed LUE Coordination: decreased fine motor;decreased gross motor  Lower Extremity Assessment Lower Extremity Assessment: Defer to PT evaluation   Cervical / Trunk Assessment Cervical / Trunk Assessment: Normal   Communication Communication Communication: No difficulties   Cognition Arousal/Alertness: Awake/alert Behavior During Therapy: WFL for tasks assessed/performed Overall Cognitive Status: No family/caregiver present to determine baseline cognitive functioning                   Orientation Level: Disoriented to;Place;Situation                             Home Living Family/patient expects to be discharged to:: Skilled nursing facility Living Arrangements: Alone Available Help at Discharge: Personal care attendant;Available PRN/intermittently Type of Home: House Home Access: Ramped entrance     Home Layout: One level     Bathroom Shower/Tub: Occupational psychologist: Standard     Home Equipment: Bedside commode;Shower seat;Walker - 2 wheels;Cane - single point;Walker - 4 wheels;Grab bars - tub/shower   Additional Comments: Patient's wife recently passed away. Per chart review has been staying with daughter since discharge from [facility] on 11/26/19.      Prior Functioning/Environment Level of Independence: Needs assistance  Gait / Transfers Assistance Needed: reports he completes transfers with a slide board ADL's / Homemaking Assistance Needed: Pt requiring assistance with ADLs, completing seated UB ADLs with min assist, max assist for LB ADLs            OT Problem List: Decreased strength;Impaired balance (sitting and/or standing);Decreased knowledge of use of DME or AE;Decreased cognition;Decreased safety awareness;Cardiopulmonary status limiting activity      OT Treatment/Interventions: Self-care/ADL training;DME and/or AE instruction;Patient/family education;Balance training;Therapeutic activities    OT Goals(Current goals can be found in the care plan section) Acute Rehab OT Goals Patient Stated Goal: None stated OT Goal Formulation: With patient Time For Goal Achievement: 12/16/19 Potential to Achieve Goals: Good  OT Frequency: Min 1X/week           Co-evaluation PT/OT/SLP Co-Evaluation/Treatment: Yes Reason for Co-Treatment: Complexity of the patient's impairments (multi-system involvement);To address functional/ADL transfers   OT goals addressed during session: ADL's and self-care         End of Session    Activity Tolerance: Patient tolerated treatment well Patient  left: in bed;with call bell/phone within reach;with bed alarm set  OT Visit Diagnosis: Muscle weakness (generalized) (M62.81);Other symptoms and signs involving cognitive function                Time: 8937-3428 OT Time Calculation (min): 16 min Charges:  OT General Charges $OT Visit: 1 Visit OT Evaluation $OT Eval Moderate Complexity: [place], OTR/L  [phone] 12/02/2019, 8:22 AM

## 2019-12-02 NOTE — Progress Notes (Signed)
Pharmacy Antibiotic Note  Anthony Dunlap is a 86 y.o. male admitted on 12/01/2019 with UTI.  Pharmacy has been consulted for Cefepime dosing. Patient with AMS, recent UTI( Pseudomonas in urine). Patient has suprapubic catheter, possible exchange Plan: Cefepime 2gm IV q24h F/U cxs and clinical progress Monitor V/S, labs   Height: 5\' 8"  (172.7 cm) Weight: 65.9 kg (145 lb 4.5 oz) IBW/kg (Calculated) : 68.4  Temp (24hrs), Avg:98.2 F (36.8 C), Min:97.8 F (36.6 C), Max:98.4 F (36.9 C)  Recent Labs  Lab 12/01/19 2238 12/02/19 0510  WBC 13.5* 10.3  CREATININE 1.27* 1.05  LATICACIDVEN 1.5  --     Estimated Creatinine Clearance: 48.8 mL/min (by C-G formula based on SCr of 1.05 mg/dL).    Allergies  Allergen Reactions  . Fluconazole Other (See Comments)    Drug-induced hepatitis    Antimicrobials this admission: Ceftriaxone 10/12 >> 10/13 Cefepime 10/13 >>   Dose adjustments this admission: prn  Microbiology results: 10/12 BCx: pending 10/12 UCx: pending  Thank you for allowing pharmacy to be a part of this patient's care.  Isac Sarna, BS Pharm D, [place] Clinical Pharmacist Pager [phone] 12/02/2019 11:05 AM

## 2019-12-02 NOTE — Progress Notes (Signed)
[facility]  PROGRESS NOTE  [name] IRJ:[id number] DOB: 1933/06/26 DOA: 12/01/2019 PCP: [name], MD Admit date - 12/01/2019   Admitting Physician Bernadette Hoit, DO  Outpatient Primary MD for the patient is [name], MD  LOS - 0 Brief Narrative   Anthony Dunlap is a 86 y.o. 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 fib who presented on 10/12 with altered mental status and concern for new left-sided weakness and was found to have leukocytosis, thrombocytosis, tachypnea, hyperglycemia, creatinine of 1.27 concerning for sepsis secondary to presumed UTI given abnormal UA in the setting of chronic indwelling Foley.  Patient was started on IV ceftriaxone.  Telemetry neurology was also consulted due to concern for stroke work-up.  MRI brain was negative for any acute intracranial pathology.  Discussed case with urology who recommended simple catheter exchange at bedside given concern for UTI  Subjective  Today he denies any acute pain.  Realizes he is in the hospital.  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  Foley usually exchanged monthly, last documented exchange on 9/22 (over wire via cystoscopy by urology given concern for retraction of Foley into the prostate). -Spoke with urology, will trial catheter exchange at bedside (size 16) given there for urethral stricture, but if any problems they will come by  Acute encephalopathy, suspect infectious, improving.  Currently alert to self, place, but not the context.  Following commands.  MRI brain unremarkable.  Suspect being driven by possible UTI given concerning UA, chronic indwelling Foley with an otherwise unexplained cause and change of mentation -Continue closely monitor neuro status -Continue IV antibiotics for presumed  UTI  Sepsis secondary to presumed UTI, chronic indwelling catheter ( poa).  Presented with tachypnea, leukocytosis positive UA with chronic indwelling Foley in changes in mentation.  White count downtrending, remains afebrile. 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  Staples in place some slight erythema but no obvious drainage, no fluctuance, no pain.  No current signs of infection, completed IV course of antibiotics -Continue to monitor  Intertriginous yeast infection.  Open wound oral antifungal given elevation LFTs in the past. -Nystatin powder  Type 2 diabetes with hyperglycemia, A1c 8.5%. -Need assistance with inpatient medication reconciliation (on Lantus at home but unclear dosage) -Continue sliding scale insulin, start Lantus 10 units  Prostate cancer status post brachytherapy in 2009.  PSA < 0.01 on 0/23/21  Sacral decubitus ulcer, stage 2, present on admission Seems to be stage II on my examination loss of dermis with some shallow ulcerations but no full-thickness tissue loss -Continue wound care, frequent turning  Paroxysmal atrial fibrillation/flutter, currently rate controlled.  Currently in normal sinus rhythm -Eliquis per pharmacy (home regimen -Continue amiodarone -Toprol, diltiazem   PAD.  Status post bilateral BKA. -Continue Plavix and statin  Diabetes with peripheral neuropathy.  CBGs in the 220s -Continue Elavil -Monitor CBG, sliding scale as needed, -Resume home Lantus 4 units   Hypothyroidism, stable -Continue Synthroid  GERD, stable -Continue PPI       Family Communication  :  None at bedside we will call family  Code Status : Full code, discussed on day of admission  Disposition Plan  :  Patient  is from home. Anticipated d/c date: 2 to 3 days.  Barriers to d/c or necessity for inpatient status:  Patient initially admitted as observation however given patient still requiring IV cefepime due to concern for recent UTI and possible pseudomonal etiology will require continued inpatient stay Consults  : Urology, neurology  Procedures  :    DVT Prophylaxis  : Home Eliquis  MDM: The below labs and imaging reports were reviewed and summarized above.  Medication management as above.  Lab Results  Component Value Date   PLT 684 (H) 12/02/2019    Diet :  Diet Order            Diet NPO time specified  Diet effective now                  Inpatient Medications Scheduled Meds: . aspirin EC  81 mg Oral Daily  . feeding supplement (GLUCERNA SHAKE)  237 mL Oral TID BM  . insulin aspart  0-15 Units Subcutaneous TID WC  . insulin aspart  0-5 Units Subcutaneous QHS  . nystatin   Topical TID   Continuous Infusions: . ceFEPime (MAXIPIME) IV     PRN Meds:.acetaminophen **OR** acetaminophen  Antibiotics  :   Anti-infectives (From admission, onward)   Start     Dose/Rate Route Frequency Ordered Stop   12/03/19 0200  cefTRIAXone (ROCEPHIN) 1 g in sodium chloride 0.9 % 100 mL IVPB  Status:  Discontinued        1 g 200 mL/hr over 30 Minutes Intravenous Every 24 hours 12/02/19 0510 12/02/19 1104   12/02/19 1200  ceFEPIme (MAXIPIME) 2 g in sodium chloride 0.9 % 100 mL IVPB        2 g 200 mL/hr over 30 Minutes Intravenous Every 24 hours 12/02/19 1104     12/02/19 0145  cefTRIAXone (ROCEPHIN) 2 g in sodium chloride 0.9 % 100 mL IVPB        2 g 200 mL/hr over 30 Minutes Intravenous  Once 12/02/19 0134 12/02/19 0340       Objective   Vitals:   12/02/19 0400 12/02/19 0446 12/02/19 0500 12/02/19 0528  BP: 130/67  118/75 138/77  Pulse:    88  Resp: 20  (!) 24 20  Temp:  98.4 F (36.9 C)  97.8 F (36.6 C)  TempSrc:  Oral  Oral  SpO2:    99%  Weight:      Height:        SpO2: 99 % O2 Flow Rate (L/min): 1 L/min FiO2 (%): 24  %  Wt Readings from Last 3 Encounters:  12/01/19 65.9 kg  11/26/19 65.9 kg  10/20/19 77.1 kg     Intake/Output Summary (Last 24 hours) at 12/02/2019 1141 Last data filed at 12/02/2019 0535 Gross per 24 hour  Intake 600 ml  Output --  Net 600 ml    Physical Exam:     Awake Alert, Oriented X 3, Normal affect No new F.N deficits,  Boyd.AT, Normal respiratory effort on room air, CTAB RRR,No Gallops,Rubs or new Murmurs, Bilateral BKA, staples in place-TKA, slight erythema right BKA without any obvious signs of drainage, no fluctuance Erythema, moisture in intertriginous areas Foley catheter in place +ve B.Sounds, Abd Soft, No tenderness, No rebound, guarding or rigidity.    I have personally reviewed the following:   Data Reviewed:  CBC Recent Labs  Lab 12/01/19 2238 12/02/19 0510  WBC 13.5* 10.3  HGB 9.9* 9.5*  HCT 32.8* 31.8*  PLT  746* 684*  MCV 84.1 83.0  MCH 25.4* 24.8*  MCHC 30.2 29.9*  RDW 17.0* 16.9*  LYMPHSABS 2.5  --   MONOABS 0.7  --   EOSABS 0.4  --   BASOSABS 0.1  --     Chemistries  Recent Labs  Lab 12/01/19 2238 12/02/19 0510  NA 134* 136  K 4.6 4.2  CL 100 103  CO2 24 23  GLUCOSE 261* 237*  BUN 21 21  CREATININE 1.27* 1.05  CALCIUM 8.7* 8.8*  MG  --  1.9  AST 14* 13*  ALT 14 14  ALKPHOS 185* 179*  BILITOT 0.4 0.5   ------------------------------------------------------------------------------------------------------------------ Recent Labs    12/02/19 0510  CHOL 168  HDL 39*  LDLCALC 80  TRIG 245*  CHOLHDL 4.3    Lab Results  Component Value Date   HGBA1C 8.5 (H) 11/05/2019   ------------------------------------------------------------------------------------------------------------------ No results for input(s): TSH, T4TOTAL, T3FREE, THYROIDAB in the last 72 hours.  Invalid input(s): FREET3 ------------------------------------------------------------------------------------------------------------------ No  results for input(s): VITAMINB12, FOLATE, FERRITIN, TIBC, IRON, RETICCTPCT in the last 72 hours.  Coagulation profile Recent Labs  Lab 12/02/19 0510  INR 1.5*    No results for input(s): DDIMER in the last 72 hours.  Cardiac Enzymes No results for input(s): CKMB, TROPONINI, MYOGLOBIN in the last 168 hours.  Invalid input(s): CK ------------------------------------------------------------------------------------------------------------------ No results found for: BNP  Micro Results Recent Results (from the past 240 hour(s))  Culture, blood (routine x 2)     Status: None (Preliminary result)   Collection Time: 12/01/19 10:51 PM   Specimen: BLOOD RIGHT HAND  Result Value Ref Range Status   Specimen Description BLOOD RIGHT HAND  Final   Special Requests   Final    BOTTLES DRAWN AEROBIC AND ANAEROBIC Blood Culture adequate volume   Culture   Final    NO GROWTH < 12 HOURS Performed at [facility], [address] [place], [place] [address]    Report Status PENDING  Incomplete  Respiratory Panel by RT PCR (Flu A&B, Covid) - Nasopharyngeal Swab     Status: None   Collection Time: 12/01/19 11:00 PM   Specimen: Nasopharyngeal Swab  Result Value Ref Range Status   SARS Coronavirus 2 by RT PCR NEGATIVE NEGATIVE Final    Comment: (NOTE) SARS-CoV-2 target nucleic acids are NOT DETECTED.  The SARS-CoV-2 RNA is generally detectable in upper respiratoy specimens during the acute phase of infection. The lowest concentration of SARS-CoV-2 viral copies this assay can detect is 131 copies/mL. A negative result does not preclude SARS-Cov-2 infection and should not be used as the sole basis for treatment or other patient management decisions. A negative result may occur with  improper specimen collection/handling, submission of specimen other than nasopharyngeal swab, presence of viral mutation(s) within the areas targeted by this assay, and inadequate number of viral copies (<131  copies/mL). A negative result must be combined with clinical observations, patient history, and epidemiological information. The expected result is Negative.  Fact Sheet for Patients:  PinkCheek.be  Fact Sheet for Healthcare Providers:  GravelBags.it  This test is no t yet approved or cleared by the [place] FDA and  has been authorized for detection and/or diagnosis of SARS-CoV-2 by FDA under an Emergency Use Authorization (EUA). This EUA will remain  in effect (meaning this test can be used) for the duration of the COVID-19 declaration under Section 564(b)(1) of the Act, 21 U.S.C. section 360bbb-3(b)(1), unless the authorization is terminated or revoked sooner.  Influenza A by PCR NEGATIVE NEGATIVE Final   Influenza B by PCR NEGATIVE NEGATIVE Final    Comment: (NOTE) The Xpert Xpress SARS-CoV-2/FLU/RSV assay is intended as an aid in  the diagnosis of influenza from Nasopharyngeal swab specimens and  should not be used as a sole basis for treatment. Nasal washings and  aspirates are unacceptable for Xpert Xpress SARS-CoV-2/FLU/RSV  testing.  Fact Sheet for Patients: PinkCheek.be  Fact Sheet for Healthcare Providers: GravelBags.it  This test is not yet approved or cleared by the [place] FDA and  has been authorized for detection and/or diagnosis of SARS-CoV-2 by  FDA under an Emergency Use Authorization (EUA). This EUA will remain  in effect (meaning this test can be used) for the duration of the  Covid-19 declaration under Section 564(b)(1) of the Act, 21  U.S.C. section 360bbb-3(b)(1), unless the authorization is  terminated or revoked. Performed at [facility], [address] [place], [place] [address]   Culture, blood (routine x 2)     Status: None (Preliminary result)   Collection Time: 12/01/19 11:04 PM   Specimen: BLOOD RIGHT HAND   Result Value Ref Range Status   Specimen Description BLOOD RIGHT HAND  Final   Special Requests   Final    BOTTLES DRAWN AEROBIC AND ANAEROBIC Blood Culture adequate volume   Culture   Final    NO GROWTH < 12 HOURS Performed at [facility], [address] [place], [place] [address]    Report Status PENDING  Incomplete    Radiology Reports CT ABDOMEN PELVIS WO CONTRAST  Result Date: 11/20/2019 CLINICAL DATA:  Abdominal pain and distension. EXAM: CT ABDOMEN AND PELVIS WITHOUT CONTRAST TECHNIQUE: Multidetector CT imaging of the abdomen and pelvis was performed following the standard protocol without IV contrast. COMPARISON:  CT 2 weeks ago 11/05/2019, pelvis MRI 01/25/2019 FINDINGS: Lower chest: Improved right but slightly worsening left pleural effusion. Adjacent atelectasis. High density pericardial fluid which has slightly diminished in volume from prior exam. There are coronary artery calcifications. Motion obscures detailed assessment. Hepatobiliary: No obvious focal liver abnormality, partially obscured by motion. Motion through the gallbladder without calcified gallstone. There is no biliary dilatation. Pancreas: Parenchymal atrophy. No ductal dilatation or inflammation. Spleen: Motion limited, no gross acute abnormality. Adrenals/Urinary Tract: Mild adrenal thickening without dominant nodule. Motion through the kidneys. There is no hydronephrosis. Mild bilateral renal parenchymal atrophy. Suggestion of mild symmetric perinephric edema, motion obscured. No visualized renal calculi. Urinary bladder is decompressed by catheter. Stomach/Bowel: Similar appearance of gaseous distension and redundancy of the sigmoid colon. Large stool burden in the ascending, transverse, and proximal descending colon. Mixed liquid and solid stool within the rectum. No obvious rectal wall thickening. Administered enteric contrast reaches the distal small bowel. Inguinal canals are patulous with small bowel approaching  but not entering. No small bowel obstruction. Partially distended stomach, primarily decompressed. Vascular/Lymphatic: Aorto bi-iliac atherosclerosis. Aortic branch atherosclerosis. Small left inguinal nodes largest measuring 9 mm short axis, unchanged. Reproductive: Brachytherapy seeds in the prostate gland. Other: No free air or ascites/free fluid. Small fat containing umbilical hernia. 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 Right hip arthroplasty. There is a small left hip joint effusion. Left hip osteoarthritis. Degenerative change in the lumbar spine with primarily facet hypertrophy. Enthesopathic change involving the hamstring insertions. No acute osseous abnormalities. IMPRESSION: 1. No acute abnormality in the abdomen/pelvis. 2. Large stool burden with gaseous distension and redundancy of the sigmoid colon, similar to  prior exam. Mixed liquid and solid stool within the rectum without obvious rectal wall thickening. Overall findings consistent with constipation. There is no bowel obstruction. 3. Improved right but slightly worsening small left pleural effusion. High density pericardial fluid has slightly diminished in volume from prior exam. 4. Diminished right inguinal hernia, inguinal canal appears patulous on the current exam. No bowel involvement currently. 5. Chronic osteomyelitis of the pubic symphysis. Aortic Atherosclerosis (ICD10-I70.0). Electronically Signed   By: Keith Rake M.D.   On: 11/20/2019 23:40   DG Chest 1 View  Result Date: 12/01/2019 CLINICAL DATA:  Altered mental status EXAM: CHEST  1 VIEW COMPARISON:  11/19/2019 FINDINGS: Cardiac shadow is within normal limits. Tortuous thoracic aorta is noted. The lungs are well aerated without focal infiltrate or sizable effusion. Degenerative changes of the thoracic spine are noted. IMPRESSION: No active disease. Electronically Signed   By: Inez Catalina M.D.   On:  12/01/2019 23:53   DG Abd 1 View  Result Date: 11/08/2019 CLINICAL DATA:  Follow-up for abdominal distension EXAM: ABDOMEN - 1 VIEW COMPARISON:  None. FINDINGS: Gaseous distension of the small bowel and colon. Moderate amount of stool throughout the colon. No evidence of pneumoperitoneum, portal venous gas or pneumatosis. No pathologic calcifications along the expected course of the ureters. No acute osseous abnormality. IMPRESSION: Gaseous distension of the small bowel and colon as can be seen with an ileus. Electronically Signed   By: Kathreen Devoid   On: 11/08/2019 15:47   CT Head Wo Contrast  Result Date: 12/01/2019 CLINICAL DATA:  Altered mental status, slurred speech EXAM: CT HEAD WITHOUT CONTRAST TECHNIQUE: Contiguous axial images were obtained from the base of the skull through the vertex without intravenous contrast. COMPARISON:  MRI 08/20/2013 (images only) CT head 08/10/2013 (report only) FINDINGS: Brain: No evidence of acute infarction, hemorrhage, hydrocephalus, extra-axial collection, visible mass lesion or mass effect. Symmetric prominence of the ventricles, cisterns and sulci compatible with parenchymal volume loss. Patchy areas of white matter hypoattenuation are most compatible with chronic microvascular angiopathy. Vascular: Atherosclerotic calcification of the carotid siphons and intradural vertebral arteries. No hyperdense vessel. Skull: No calvarial fracture or suspicious osseous lesion. No scalp swelling or hematoma. Sinuses/Orbits: Paranasal sinuses and mastoid air cells are predominantly clear. Orbital structures are unremarkable aside from prior lens extractions. Other: Debris in the right external auditory canal. IMPRESSION: 1. No acute intracranial findings. If there is persisting concern for acute infarction, MRI is more sensitive and specific for early features of ischemia. 2. Chronic microvascular angiopathy and parenchymal volume loss. 3. Debris in the right external auditory  canal, correlate for cerumen impaction. Electronically Signed   By: Lovena Le M.D.   On: 12/01/2019 23:29   MR BRAIN WO CONTRAST  Result Date: 12/02/2019 CLINICAL DATA:  Altered mental status, weakness EXAM: MRI HEAD WITHOUT CONTRAST TECHNIQUE: Multiplanar, multiecho pulse sequences of the brain and surrounding structures were obtained without intravenous contrast. COMPARISON:  2015 FINDINGS: Brain: There is no acute infarction or intracranial hemorrhage. There is no intracranial mass, mass effect, or edema. There is no hydrocephalus or extra-axial fluid collection. Ventricles and sulci prominent compatible with generalized parenchymal volume loss, which has progressed since 2015. Patchy and confluent areas of T2 hyperintensity in the supratentorial greater than pontine white matter are nonspecific but probably reflect moderate chronic microvascular ischemic changes, which have also progressed. Vascular: Major vessel flow voids at the skull base are preserved. Skull and upper cervical spine: Normal marrow signal is preserved. Sinuses/Orbits: Trace mucosal  thickening.  Orbits are unremarkable. Other: Sella is unremarkable.  Bilateral lens replacements. IMPRESSION: No acute infarction, hemorrhage, or mass. Progression of chronic microvascular ischemic changes and parenchymal volume loss since 2015. Electronically Signed   By: Macy Mis M.D.   On: 12/02/2019 09:01   CT Abdomen Pelvis W Contrast  Result Date: 11/05/2019 CLINICAL DATA:  Bowel obstruction, pubic symphyseal osteomyelitis EXAM: CT ABDOMEN AND PELVIS WITH CONTRAST TECHNIQUE: Multidetector CT imaging of the abdomen and pelvis was performed using the standard protocol following bolus administration of intravenous contrast. CONTRAST:  175mL OMNIPAQUE IOHEXOL 300 MG/ML  SOLN COMPARISON:  None. FINDINGS: Lower chest: Small bilateral pleural effusions are present with associated mild bibasilar compressive atelectasis. Small pericardial effusion is  present, new since prior examination, with associated pericardial enhancement suggesting a complex/exudative effusion. Cardiac size is within normal limits. No CT evidence of cardiac tamponade. Extensive distal right coronary artery calcification. Hepatobiliary: No focal liver abnormality is seen. No gallstones, gallbladder wall thickening, or biliary dilatation. Pancreas: Moderate atrophy, but otherwise unremarkable. Spleen: Unremarkable Adrenals/Urinary Tract: The adrenal glands are unremarkable. The kidneys are normal in size and position. Tiny exophytic cortical cyst within the upper pole of the left kidney. The kidneys are otherwise unremarkable. The bladder is decompressed with a Foley catheter balloon seen within its lumen. Stomach/Bowel: Stomach and small bowel are unremarkable. The sigmoid colon is markedly redundant, however, there is no evidence of obstruction or focal inflammation. Moderate stool within the ascending and transverse colon. The appendix is not visualized and is likely absent. There is no free intraperitoneal gas or fluid. Vascular/Lymphatic: No pathologic adenopathy within the abdomen and pelvis. There is mild aortoiliac atherosclerotic calcification noted without evidence of aneurysm. Particularly prominent calcification, however, is seen at the origin of the mesenteric and renal vasculature, though the degree of stenosis is not well assessed on this non arteriographic study. Reproductive: Brachytherapy seeds are seen within the prostate gland. Other: Small right inguinal hernia is present containing portions of a distal small bowel and the cecum as well as a small amount of mesenteric fat. Tiny fat containing left inguinal hernia is present. Musculoskeletal: Right total hip arthroplasty has been performed. Moderate left hip degenerative arthritis. Mixed lytic and sclerotic changes involving the pubic symphyses are compatible with the given history of symphyseal osteomyelitis.  Calcifications seen at the a hamstring origins likely relates to remote trauma or inflammation. Degenerative changes are seen within the lumbar spine. No lytic or blastic bone lesions are seen. IMPRESSION: 1. Small pericardial effusion with associated pericardial enhancement suggesting a complex/exudative effusion. No CT evidence of cardiac tamponade. 2. Small bilateral pleural effusions. 3. Small right inguinal hernia containing portions of a distal small bowel and cecum. No evidence of bowel obstruction. 4. 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 Aortic Atherosclerosis (ICD10-I70.0). Electronically Signed   By: Fidela Salisbury MD   On: 11/05/2019 15:44   MR FOOT LEFT W WO CONTRAST  Result Date: 11/08/2019 CLINICAL DATA:  Osteomyelitis of the foot. Plain foot soft tissue wound EXAM: MRI OF THE LEFT FOREFOOT WITHOUT AND WITH CONTRAST TECHNIQUE: Multiplanar, multisequence MR imaging of the left foot was performed both before and after administration of intravenous contrast. CONTRAST:  76mL GADAVIST GADOBUTROL 1 MMOL/ML IV SOLN COMPARISON:  None. FINDINGS: TENDONS Peroneal: Peroneal longus tendon intact. Peroneal brevis intact. Posteromedial: Posterior tibial tendon intact. Flexor hallucis longus tendon intact. Flexor digitorum longus tendon intact. Anterior: Tibialis anterior tendon intact. Extensor  hallucis longus tendon intact Extensor digitorum longus tendon intact. Achilles:  Mild tendinosis of the distal Achilles tendon. Plantar Fascia: Thickening of the medial band of the plantar fascia at the calcaneal insertion as can be seen with mild plantar fasciitis. LIGAMENTS Lateral: Anterior talofibular ligament intact. Calcaneofibular ligament intact. Posterior talofibular ligament intact. Anterior and posterior tibiofibular ligaments intact. Medial: Deltoid ligament intact. Spring ligament intact. CARTILAGE Ankle Joint: No joint  effusion. Normal ankle mortise. No chondral defect. Subtalar Joints/Sinus Tarsi: Normal subtalar joints. No subtalar joint effusion. Normal sinus tarsi. Bones/Soft Tissue: Soft tissue wound along the posterolateral aspect of the hindfoot overlying the posterior calcaneus. Underlying cortical destruction of the posterolateral calcaneus just inferior to the Achilles insertion with severe surrounding marrow edema most consistent with osteomyelitis. No drainable fluid collection. Soft tissue edema throughout the hindfoot extending into the ankle concerning for cellulitis. T2 hyperintense signal throughout the plantar musculature likely neurogenic. IMPRESSION: 1. Soft tissue wound along the posterolateral aspect of the hindfoot overlying the posterior calcaneus. Underlying cortical destruction of the posterolateral calcaneus just inferior to the Achilles insertion with severe surrounding marrow edema most consistent with osteomyelitis with surrounding cellulitis. No drainable fluid collection. 2. Mild tendinosis of the distal Achilles tendon. 3. Thickening of the medial band of the plantar fascia at the calcaneal insertion as can be seen with mild plantar fasciitis. Electronically Signed   By: Kathreen Devoid   On: 11/08/2019 11:46   DG Chest Port 1 View  Result Date: 11/19/2019 CLINICAL DATA:  Fever. EXAM: PORTABLE CHEST 1 VIEW COMPARISON:  11/05/2019 FINDINGS: Borderline cardiomegaly. Unchanged mediastinal contours. Mild chronic elevation of left hemidiaphragm with adjacent atelectasis. Confluent consolidation. No pulmonary edema. No pneumothorax. No significant pleural effusion. Stable osseous structures with chronic degenerative change of the left shoulder. IMPRESSION: 1. No evidence of pneumonia.  Borderline cardiomegaly. 2. Chronic elevation of left hemidiaphragm with adjacent atelectasis. Electronically Signed   By: Keith Rake M.D.   On: 11/19/2019 17:53   DG Chest Port 1 View  Result Date:  11/05/2019 CLINICAL DATA:  New onset AFib EXAM: PORTABLE CHEST 1 VIEW COMPARISON:  June 09, 2019 FINDINGS: The cardiomediastinal silhouette is unchanged in contour when accounting for differences in technique.Trace fluid in the RIGHT minor fissure. No pleural effusion. No pneumothorax. No acute pleuroparenchymal abnormality. Gaseous distension of the colon. Multilevel degenerative changes of the thoracic spine. IMPRESSION: No acute cardiopulmonary abnormality. Electronically Signed   By: Valentino Saxon MD   On: 11/05/2019 12:26   ECHOCARDIOGRAM COMPLETE  Result Date: 11/06/2019    ECHOCARDIOGRAM REPORT   Patient Name:   Anthony Dunlap Date of Exam: 11/06/2019 Medical Rec #:  [id number]      Height:       68.0 in Accession #:    [id number]     Weight:       170.0 lb Date of Birth:  October 25, 1933      BSA:          1.907 m Patient Age:    36 years       BP:           103/75 mmHg Patient Gender: M              HR:           78 bpm. Exam Location:  [facility] Procedure: 2D Echo, Cardiac Doppler and Color Doppler Indications:    Prostate Cancer  History:        Patient has prior history of Echocardiogram  examinations, most                 recent 01/23/2018. Risk Factors:Dyslipidemia and Diabetes.                 Prostate Cancer.  Sonographer:    Alvino Chapel RCS Referring Phys: [id number] DAWOOD S [employer]  Sonographer Comments: Technically difficult study due to poor echo windows. IMPRESSIONS  1. Left ventricular ejection fraction, by estimation, is 60 to 65%. The left ventricle has normal function. Left ventricular endocardial border not optimally defined to evaluate regional wall motion. There is mild left ventricular hypertrophy. Left ventricular diastolic parameters are indeterminate.  2. Right ventricular systolic function is normal. The right ventricular size is normal.  3. Left atrial size was mildly dilated.  4. The pericardial effusion is circumferential.  5. The mitral valve is normal in structure. Trivial  mitral valve regurgitation. No evidence of mitral stenosis.  6. The aortic valve was not well visualized. Aortic valve regurgitation is not visualized. No aortic stenosis is present. FINDINGS  Left Ventricle: Left ventricular ejection fraction, by estimation, is 60 to 65%. The left ventricle has normal function. Left ventricular endocardial border not optimally defined to evaluate regional wall motion. The left ventricular internal cavity size was normal in size. There is mild left ventricular hypertrophy. Left ventricular diastolic parameters are indeterminate. Right Ventricle: The right ventricular size is normal. No increase in right ventricular wall thickness. Right ventricular systolic function is normal. Left Atrium: Left atrial size was mildly dilated. Right Atrium: Right atrial size was normal in size. Pericardium: Trivial pericardial effusion is present. The pericardial effusion is circumferential. Mitral Valve: The mitral valve is normal in structure. Trivial mitral valve regurgitation. No evidence of mitral valve stenosis. Tricuspid Valve: The tricuspid valve is normal in structure. Tricuspid valve regurgitation is not demonstrated. No evidence of tricuspid stenosis. Aortic Valve: The aortic valve was not well visualized. Aortic valve regurgitation is not visualized. No aortic stenosis is present. Aortic valve mean gradient measures 3.3 mmHg. Aortic valve peak gradient measures 7.2 mmHg. Aortic valve area, by VTI measures 2.80 cm. Pulmonic Valve: The pulmonic valve was not well visualized. Pulmonic valve regurgitation is not visualized. No evidence of pulmonic stenosis. Aorta: The aortic root is normal in size and structure. Pulmonary Artery: Indeterminant PASP, inadequate TR jet. Venous: The inferior vena cava was not well visualized. IAS/Shunts: The interatrial septum was not well visualized.  LEFT VENTRICLE PLAX 2D LVIDd:         4.18 cm LVIDs:         2.44 cm LV PW:         1.28 cm LV IVS:         1.12 cm LVOT diam:     2.00 cm LV SV:         56 LV SV Index:   29 LVOT Area:     3.14 cm  RIGHT VENTRICLE RV S prime:     13.00 cm/s TAPSE (M-mode): 1.4 cm LEFT ATRIUM             Index       RIGHT ATRIUM           Index LA diam:        2.60 cm 1.36 cm/m  RA Area:     15.90 cm LA Vol (A2C):   57.8 ml 30.30 ml/m RA Volume:   38.20 ml  20.03 ml/m LA Vol (A4C):   58.6 ml 30.72 ml/m LA  Biplane Vol: 59.0 ml 30.93 ml/m  AORTIC VALVE AV Area (Vmax):    2.36 cm AV Area (Vmean):   2.29 cm AV Area (VTI):     2.80 cm AV Vmax:           134.16 cm/s AV Vmean:          83.194 cm/s AV VTI:            0.199 m AV Peak Grad:      7.2 mmHg AV Mean Grad:      3.3 mmHg LVOT Vmax:         100.60 cm/s LVOT Vmean:        60.700 cm/s LVOT VTI:          0.178 m LVOT/AV VTI ratio: 0.89  AORTA Ao Root diam: 3.00 cm MITRAL VALVE                TRICUSPID VALVE MV Area (PHT): 3.61 cm     TR Peak grad:   22.3 mmHg MV Decel Time: 210 msec     TR Vmax:        236.00 cm/s MV E velocity: 123.00 cm/s                             SHUNTS                             Systemic VTI:  0.18 m                             Systemic Diam: 2.00 cm Carlyle Dolly MD Electronically signed by Carlyle Dolly MD Signature Date/Time: 11/06/2019/11:30:42 AM    Final    ECHOCARDIOGRAM LIMITED  Result Date: 12/02/2019    ECHOCARDIOGRAM LIMITED REPORT   Patient Name:   Shneur D Rabago Date of Exam: 12/02/2019 Medical Rec #:  [id number]      Height:       68.0 in Accession #:    [id number]     Weight:       145.3 lb Date of Birth:  1933-03-06      BSA:          1.784 m Patient Age:    77 years       BP:           138/77 mmHg Patient Gender: M              HR:           88 bpm. Exam Location:  [facility] Procedure: Limited Echo Indications:    Stroke 434.91 / I163.9  History:        Patient has prior history of Echocardiogram examinations, most                 recent 11/06/2019. Arrythmias:Atrial Flutter and Atrial                 Fibrillation; Risk  Factors:Dyslipidemia, Diabetes and                 Non-Smoker. Prostate cancer.  Sonographer:    Leavy Cella RDCS (AE) Referring Phys: [id number] OLADAPO ADEFESO IMPRESSIONS  1. Left ventricular ejection fraction, by estimation, is 55 to 60%. The left ventricle has normal function. Left ventricular endocardial border not optimally defined to evaluate regional wall motion. There is moderate  left ventricular hypertrophy. Left ventricular diastolic parameters are indeterminate.  2. Right ventricular systolic function is normal. The right ventricular size is normal. Tricuspid regurgitation signal is inadequate for assessing PA pressure.  3. The mitral valve is grossly normal. Trivial mitral valve regurgitation. Moderate mitral annular calcification.  4. The aortic valve is tricuspid. Aortic valve regurgitation is not visualized.  5. Unable to estimate CVP. FINDINGS  Left Ventricle: Left ventricular ejection fraction, by estimation, is 55 to 60%. The left ventricle has normal function. Left ventricular endocardial border not optimally defined to evaluate regional wall motion. The left ventricular internal cavity size was normal in size. There is moderate left ventricular hypertrophy. Left ventricular diastolic parameters are indeterminate. Right Ventricle: The right ventricular size is normal. No increase in right ventricular wall thickness. Right ventricular systolic function is normal. Tricuspid regurgitation signal is inadequate for assessing PA pressure. Pericardium: There is no evidence of pericardial effusion. Presence of pericardial fat pad. Mitral Valve: The mitral valve is grossly normal. Moderate mitral annular calcification. Trivial mitral valve regurgitation. Tricuspid Valve: The tricuspid valve is not well visualized. Tricuspid valve regurgitation is trivial. Aortic Valve: The aortic valve is tricuspid. There is mild aortic valve annular calcification. Aortic valve regurgitation is not visualized. Pulmonic  Valve: The pulmonic valve was not well visualized. Pulmonic valve regurgitation is trivial. Aorta: The aortic root is normal in size and structure. Venous: Unable to estimate CVP. The inferior vena cava was not well visualized. IAS/Shunts: No atrial level shunt detected by color flow Doppler. LEFT VENTRICLE PLAX 2D LVIDd:         2.90 cm LVIDs:         2.27 cm LV PW:         1.45 cm LV IVS:        1.25 cm LVOT diam:     2.10 cm LVOT Area:     3.46 cm  LEFT ATRIUM         Index LA diam:    3.50 cm 1.96 cm/m   AORTA Ao Root diam: 3.00 cm MITRAL VALVE MV Area (PHT): 3.85 cm     SHUNTS MV Decel Time: 197 msec     Systemic Diam: 2.10 cm MV E velocity: 56.60 cm/s MV A velocity: 101.00 cm/s MV E/A ratio:  0.56 Rozann Lesches MD Electronically signed by Rozann Lesches MD Signature Date/Time: 12/02/2019/11:17:33 AM    Final      Time Spent in minutes  30     Desiree Hane M.D on 12/02/2019 at 11:41 AM  To page go to [email or web address] - password [facility]

## 2019-12-02 NOTE — Progress Notes (Signed)
SLP Cancellation Note  Patient Details Name: Anthony Dunlap MRN: [id number] DOB: 1933/05/23   Cancelled treatment:       Reason Eval/Treat Not Completed: SLP screened, no needs identified, will sign off. Pt passed [place] therefore BSE is not indicated at this time. ST will sign off. Please re-order BSE in the case of new concerns. Thank you,  [name] H. Roddie Mc, CCC-SLP Speech Language Pathologist   Wende Bushy 12/02/2019, 5:06 PM

## 2019-12-02 NOTE — Evaluation (Signed)
Physical Therapy Evaluation Patient Details Name: Anthony Dunlap MRN: [id number] DOB: 06-16-1933 Today's Date: 12/02/2019   History of Present Illness  Anthony Dunlap is a 86 y.o. 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  Patient was unable to provide details of why he came to the hospital, he kept saying " I felt sick" without being able to expantiate on this. Per report, patient was reported to have slurred speech, decreased appetite and not acting himself, patient was also said to have left-sided weakness.  He has chronic indwelling catheter and was recently admitted and discharged from [facility] from 9/16-10/7 due to acute osteomyelitis of left calcaneus status post left BKA.    Clinical Impression  Patient demonstrates labored movement for sitting up at bedside requiring use of bed rail due to poor return for propping up on elbows, very unsteady with frequent falling backwards once seated., unable to maintain sitting balance without BUE support, unable to scoot laterally due to generalized weakness, no significant difference noted in BLE strength left vs right and patient demonstrated good return for pulling self up using head board when put back to bed.  Patient will benefit from continued physical therapy in hospital and recommended venue below to increase strength, balance, endurance for safe ADLs and gait.    Follow Up Recommendations SNF;Supervision for mobility/OOB;Supervision - Intermittent    Equipment Recommendations  None recommended by PT    Recommendations for Other Services       Precautions / Restrictions Precautions Precautions: Fall Precaution Comments: R BKA, 9/22 L BKA Restrictions Weight Bearing Restrictions: Yes RLE Weight Bearing: Non weight  bearing LLE Weight Bearing: Non weight bearing      Mobility  Bed Mobility Overal bed mobility: Needs Assistance Bed Mobility: Supine to Sit;Sit to Supine     Supine to sit: Mod assist;HOB elevated Sit to supine: Mod assist   General bed mobility comments: slow labored movement, had to use bed rail due to poor carryover for propping up on elbows  Transfers                 General transfer comment: Not completed   Ambulation/Gait                Stairs            Wheelchair Mobility    Modified Rankin (Stroke Patients Only)       Balance Overall balance assessment: Needs assistance Sitting-balance support: Feet supported;No upper extremity supported Sitting balance-Leahy Scale: Poor Sitting balance - Comments: fair/poor with BUE support Postural control: Posterior lean                                   Pertinent Vitals/Pain Pain Assessment: No/denies pain    Home Living Family/patient expects to be discharged to:: Private residence Living Arrangements: Alone Available Help at Discharge: Personal care attendant;Available PRN/intermittently Type of Home: House Home Access: Ramped entrance     Home Layout: One level Home Equipment: Bedside commode;Shower seat;Walker - 2 wheels;Cane - single point;Walker - 4 wheels;Grab bars - tub/shower Additional Comments: Patient's wife recently passed away. Per chart review has been staying with daughter since discharge from [facility] on 11/26/19.    Prior Function Level of Independence: Needs  assistance   Gait / Transfers Assistance Needed: asisted sliding board transfers  ADL's / Homemaking Assistance Needed: Pt requiring assistance with ADLs, completing seated UB ADLs with min assist, max assist for LB ADLs        Hand Dominance   Dominant Hand: Right    Extremity/Trunk Assessment   Upper Extremity Assessment Upper Extremity Assessment: Defer to OT evaluation RUE Deficits / Details:  decreased grasp, tremor noted, Pt unable to perform supination and limited FF from shoulder, movements ataxic looking RUE Coordination: decreased gross motor;decreased fine motor LUE Deficits / Details: decreased FF, noticeable tremor, generalized weakness.  Min assist for reaching back to grab railing of bed to pull himself up in bed LUE Coordination: decreased fine motor;decreased gross motor    Lower Extremity Assessment Lower Extremity Assessment: Generalized weakness    Cervical / Trunk Assessment Cervical / Trunk Assessment: Normal  Communication   Communication: No difficulties  Cognition Arousal/Alertness: Awake/alert Behavior During Therapy: WFL for tasks assessed/performed Overall Cognitive Status: No family/caregiver present to determine baseline cognitive functioning                   Orientation Level: Disoriented to;Place;Situation                    General Comments      Exercises     Assessment/Plan    PT Assessment Patient needs continued PT services  PT Problem List Decreased strength;Decreased activity tolerance;Decreased balance;Decreased mobility       PT Treatment Interventions Balance training;Functional mobility training;Therapeutic activities;Therapeutic exercise;Patient/family education;Wheelchair mobility training;DME instruction    PT Goals (Current goals can be found in the Care Plan section)  Acute Rehab PT Goals Patient Stated Goal: return home with family to assist PT Goal Formulation: With patient Time For Goal Achievement: 12/16/19 Potential to Achieve Goals: Fair    Frequency Min 3X/week   Barriers to discharge        Co-evaluation PT/OT/SLP Co-Evaluation/Treatment: Yes Reason for Co-Treatment: Complexity of the patient's impairments (multi-system involvement) PT goals addressed during session: Mobility/safety with mobility;Balance OT goals addressed during session: ADL's and self-care       AM-PAC PT "6  Clicks" Mobility  Outcome Measure Help needed turning from your back to your side while in a flat bed without using bedrails?: A Lot Help needed moving from lying on your back to sitting on the side of a flat bed without using bedrails?: A Lot Help needed moving to and from a bed to a chair (including a wheelchair)?: Total Help needed standing up from a chair using your arms (e.g., wheelchair or bedside chair)?: Total Help needed to walk in hospital room?: Total Help needed climbing 3-5 steps with a railing? : Total 6 Click Score: 8    End of Session   Activity Tolerance: Patient tolerated treatment well;Patient limited by fatigue Patient left: in bed;with call bell/phone within reach Nurse Communication: Mobility status PT Visit Diagnosis: Other abnormalities of gait and mobility (R26.89);Muscle weakness (generalized) (M62.81);Difficulty in walking, not elsewhere classified (R26.2)    Time: 2536-6440 PT Time Calculation (min) (ACUTE ONLY): 22 min   Charges:   PT Evaluation $PT Eval Moderate Complexity: 1 Mod PT Treatments $Therapeutic Activity: 8-22 mins        10:57 AM, 12/02/19 Lonell Grandchild, MPT Physical Therapist with [facility] [phone] office [phone] mobile phone

## 2019-12-02 NOTE — Progress Notes (Signed)
*  PRELIMINARY RESULTS* Echocardiogram 2D Echocardiogram limited has been performed.  Anthony Dunlap 12/02/2019, 10:44 AM

## 2019-12-03 LAB — COMPREHENSIVE METABOLIC PANEL
ALT: 12 U/L (ref 0–44)
AST: 12 U/L — ABNORMAL LOW (ref 15–41)
Albumin: 3 g/dL — ABNORMAL LOW (ref 3.5–5.0)
Alkaline Phosphatase: 169 U/L — ABNORMAL HIGH (ref 38–126)
Anion gap: 11 (ref 5–15)
BUN: 18 mg/dL (ref 8–23)
CO2: 24 mmol/L (ref 22–32)
Calcium: 9.1 mg/dL (ref 8.9–10.3)
Chloride: 102 mmol/L (ref 98–111)
Creatinine, Ser: 1 mg/dL (ref 0.61–1.24)
GFR, Estimated: >60 mL/min (ref >60)
Glucose, Bld: 251 mg/dL — ABNORMAL HIGH (ref 70–99)
Potassium: 4.6 mmol/L (ref 3.5–5.1)
Sodium: 137 mmol/L (ref 135–145)
Total Bilirubin: 0.5 mg/dL (ref 0.3–1.2)
Total Protein: 7.5 g/dL (ref 6.5–8.1)

## 2019-12-03 LAB — CBC
HCT: 33.7 % — ABNORMAL LOW (ref 39.0–52.0)
Hemoglobin: 9.8 g/dL — ABNORMAL LOW (ref 13.0–17.0)
MCH: 24.5 pg — ABNORMAL LOW (ref 26.0–34.0)
MCHC: 29.1 g/dL — ABNORMAL LOW (ref 30.0–36.0)
MCV: 84.3 fL (ref 80.0–100.0)
Platelets: 653 10*3/uL — ABNORMAL HIGH (ref 150–400)
RBC: 4 MIL/uL — ABNORMAL LOW (ref 4.22–5.81)
RDW: 16.8 % — ABNORMAL HIGH (ref 11.5–15.5)
WBC: 11.5 10*3/uL — ABNORMAL HIGH (ref 4.0–10.5)
nRBC: 0 % (ref 0.0–0.2)

## 2019-12-03 LAB — GLUCOSE, CAPILLARY
Glucose-Capillary: 252 mg/dL — ABNORMAL HIGH (ref 70–99)
Glucose-Capillary: 342 mg/dL — ABNORMAL HIGH (ref 70–99)
Glucose-Capillary: 222 mg/dL — ABNORMAL HIGH (ref 70–99)
Glucose-Capillary: 198 mg/dL — ABNORMAL HIGH (ref 70–99)

## 2019-12-03 MED ORDER — PANTOPRAZOLE SODIUM 40 MG PO TBEC
40.0000 mg | DELAYED_RELEASE_TABLET | Freq: Every day | ORAL | Status: DC
  Administered 2019-12-03 – 2019-12-06 (×4): 40 mg via ORAL
  Filled 2019-12-03 (×4): qty 1

## 2019-12-03 MED ORDER — INSULIN GLARGINE 100 UNIT/ML ~~LOC~~ SOLN
10.0000 [IU] | Freq: Every day | SUBCUTANEOUS | Status: DC
  Administered 2019-12-04 – 2019-12-05 (×2): 10 [IU] via SUBCUTANEOUS
  Filled 2019-12-03 (×4): qty 0.1

## 2019-12-03 MED ORDER — PRAVASTATIN SODIUM 10 MG PO TABS
10.0000 mg | ORAL_TABLET | Freq: Every day | ORAL | Status: DC
  Administered 2019-12-03 – 2019-12-05 (×3): 10 mg via ORAL
  Filled 2019-12-03 (×3): qty 1

## 2019-12-03 MED ORDER — LEVOTHYROXINE SODIUM 100 MCG PO TABS
100.0000 ug | ORAL_TABLET | Freq: Every day | ORAL | Status: DC
  Administered 2019-12-04 – 2019-12-06 (×3): 100 ug via ORAL
  Filled 2019-12-03 (×3): qty 1

## 2019-12-03 MED ORDER — AMIODARONE HCL 200 MG PO TABS
200.0000 mg | ORAL_TABLET | Freq: Two times a day (BID) | ORAL | Status: DC
  Administered 2019-12-03 – 2019-12-06 (×7): 200 mg via ORAL
  Filled 2019-12-03 (×7): qty 1

## 2019-12-03 MED ORDER — CIPROFLOXACIN HCL 250 MG PO TABS
500.0000 mg | ORAL_TABLET | Freq: Two times a day (BID) | ORAL | Status: DC
  Administered 2019-12-03 – 2019-12-06 (×6): 500 mg via ORAL
  Filled 2019-12-03 (×7): qty 2

## 2019-12-03 NOTE — Progress Notes (Signed)
Assumed care of pt. From CN. Condition stable. No changes to initial am assessment at this  Time. Cont with plan of care

## 2019-12-03 NOTE — Progress Notes (Signed)
Inpatient Diabetes Program Recommendations  AACE/ADA: New Consensus Statement on Inpatient Glycemic Control (2015)  Target Ranges:  Prepandial:   less than 140 mg/dL      Peak postprandial:   less than 180 mg/dL (1-2 hours)      Critically ill patients:  140 - 180 mg/dL   Lab Results  Component Value Date   GLUCAP 342 (H) 12/03/2019   HGBA1C 8.5 (H) 11/05/2019    Review of Glycemic Control Results for Anthony Dunlap, Anthony Dunlap (MRN [id number]) as of 12/03/2019 12:03  Ref. Range 12/02/2019 20:36 12/03/2019 07:48 12/03/2019 11:26  Glucose-Capillary Latest Ref Range: 70 - 99 mg/dL 152 (H) 252 (H) 342 (H)   Diabetes history: Type 2 DM Outpatient Diabetes medications: Novolog 0-15 units TID, Basaglar 4 units QD Current orders for Inpatient glycemic control: Lantus 4 units QD, Novolog 0-15 units TID, Novolog 0-5 units QHS  Inpatient Diabetes Program Recommendations:    Consider increasing Lantus to 10 units QD.   Thanks, Bronson Curb, MSN, RNC-OB Diabetes Coordinator [phone] (8a-5p)

## 2019-12-03 NOTE — Plan of Care (Signed)

## 2019-12-03 NOTE — Progress Notes (Signed)
[facility]  PROGRESS NOTE  Anthony Dunlap WFU:[id number] DOB: May 03, 1933 DOA: 12/01/2019 PCP: [name], MD Admit date - 12/01/2019   Admitting Physician Desiree Hane, MD  Outpatient Primary MD for the patient is [name], MD  LOS - 1 Brief Narrative   Anthony Dunlap is a 86 y.o. 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 fib who presented on 10/12 with altered mental status and concern for new left-sided weakness and was found to have leukocytosis, thrombocytosis, tachypnea, hyperglycemia, creatinine of 1.27 concerning for sepsis secondary to presumed UTI given abnormal UA in the setting of chronic indwelling Foley.  Patient was started on IV ceftriaxone.  Telemetry neurology was also consulted due to concern for stroke work-up.   MRI brain was negative for any acute intracranial pathology.  Discussed case with urology who recommended simple catheter exchange at bedside given concern for UTI  Subjective  Today feels great.  Denies any pain.  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  Foley usually exchanged monthly, last documented exchange on 9/22 (over wire via cystoscopy by urology given concern for retraction of Foley into the prostate). -Spoke with urology, who recommended bedside exchange by nursing on 10/13  (size 16) given there for urethral stricture  Acute encephalopathy, suspect infectious, improving  Currently alert to self, place, but not to context.  Following commands.  MRI brain unremarkable.  Suspect being driven by possible UTI given concerning UA, chronic indwelling Foley with an otherwise unexplained cause in change of mentation -Continue closely monitor neuro status -Continue IV antibiotics for presumed UTI  Sepsis secondary to presumed UTI, chronic indwelling  catheter ( poa).  Presented with tachypnea, leukocytosis positive UA with chronic indwelling Foley in changes in mentation.  White count downtrending, remains afebrile. 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  Staples in place some slight erythema but no obvious drainage, no fluctuance, no pain.  No current signs of infection, completed IV course of antibiotics -Continue to monitor  Intertriginous yeast infection.  Open wound oral antifungal given elevation LFTs in the past. -Nystatin powder  Type 2 diabetes with hyperglycemia, A1c 8.5%.  Poorly controlled blood sugars in the 300s -Home medications NovoLog 3 to 15 units 3 times daily, Basaglar 4 units daily -Continue sliding scale insulin, increase Lantus to 10 units  Prostate cancer status post brachytherapy in 2009.  PSA < 0.01 on 0/23/21  Sacral decubitus ulcer, stage 2, present on admission Seems to be stage II on my examination loss of dermis with some shallow ulcerations but no full-thickness tissue loss -Continue wound care, frequent turning  Paroxysmal atrial fibrillation/flutter, currently rate controlled.  Currently in normal sinus rhythm -Eliquis per pharmacy (home regimen -Continue amiodarone -Toprol, diltiazem   PAD.  Status post bilateral BKA. -Continue Plavix and statin  Diabetes with peripheral neuropathy.  CBGs in the 220s -Continue Elavil -Monitor CBG, sliding scale as needed, -Resume home Lantus 4 units  Hypothyroidism, stable -Continue Synthroid  GERD, stable -Continue PPI       Family Communication  : Spoke to daughter  Ms. [name] on 10/14  Code Status : Full code, discussed on day of admission  Disposition Plan  :  Patient is from home. Anticipated d/c date:  1 to 2 days. Barriers to  d/c or necessity for inpatient status:  On IV cefepime, plan to de-escalate to oral based off urine cultures, likely ciprofloxacin, if remains afebrile on oral regimen likely discharge in next 24 to 48 hours.  PT recommends SNF with patient/family agreeable to home health PT Consults  : Urology, neurology  Procedures  :    DVT Prophylaxis  : Home Eliquis  MDM: The below labs and imaging reports were reviewed and summarized above.  Medication management as above.  Lab Results  Component Value Date   PLT 653 (H) 12/03/2019    Diet :  Diet Order            Diet renal/carb modified with fluid restriction Diet-HS Snack? Nothing; Fluid restriction: 1200 mL Fluid; Room service appropriate? Yes; Fluid consistency: Thin  Diet effective now                  Inpatient Medications Scheduled Meds: . amiodarone  200 mg Oral BID  . amitriptyline  20 mg Oral QHS  . apixaban  5 mg Oral BID  . aspirin EC  81 mg Oral Daily  . Chlorhexidine Gluconate Cloth  6 each Topical Daily  . clopidogrel  75 mg Oral Daily  . diltiazem  300 mg Oral Daily  . feeding supplement (GLUCERNA SHAKE)  237 mL Oral TID BM  . ferrous sulfate  325 mg Oral BID WC  . insulin aspart  0-15 Units Subcutaneous TID WC  . insulin aspart  0-5 Units Subcutaneous QHS  . [START ON 12/04/2019] insulin glargine  10 Units Subcutaneous Daily  . [START ON 12/04/2019] levothyroxine  100 mcg Oral Q breakfast  . metoprolol succinate  100 mg Oral Daily  . multivitamin with minerals  1 tablet Oral Daily  . nutrition supplement (JUVEN)  1 packet Oral BID BM  . nystatin   Topical TID  . oxybutynin  5 mg Oral QHS  . pantoprazole  40 mg Oral Daily  . pravastatin  10 mg Oral Q supper  . tobramycin  1 drop Both Eyes Q4H   Continuous Infusions: . ceFEPime (MAXIPIME) IV 2 g (12/03/19 1409)   PRN Meds:.acetaminophen **OR** acetaminophen, colchicine, senna-docusate  Antibiotics  :   Anti-infectives (From admission, onward)   Start      Dose/Rate Route Frequency Ordered Stop   12/03/19 0200  cefTRIAXone (ROCEPHIN) 1 g in sodium chloride 0.9 % 100 mL IVPB  Status:  Discontinued        1 g 200 mL/hr over 30 Minutes Intravenous Every 24 hours 12/02/19 0510 12/02/19 1104   12/02/19 1200  ceFEPIme (MAXIPIME) 2 g in sodium chloride 0.9 % 100 mL IVPB        2 g 200 mL/hr over 30 Minutes Intravenous Every 24 hours 12/02/19 1104     12/02/19 0145  cefTRIAXone (ROCEPHIN) 2 g in sodium chloride 0.9 % 100 mL IVPB        2 g 200 mL/hr over 30 Minutes Intravenous  Once 12/02/19 0134 12/02/19 0340       Objective   Vitals:   12/02/19 1332 12/02/19 2038 12/03/19 0458 12/03/19 1347  BP: (!) 146/86 114/77 122/76 127/71  Pulse: 92 97 99 100  Resp: 18 15 16 18   Temp: 97.8 F (36.6 C) (!) 97.5 F (36.4 C) 98.3 F (36.8 C) 97.7 F (36.5 C)  TempSrc: Oral   Oral  SpO2: 98% 98%  99% 97%  Weight:      Height:        SpO2: 97 % O2 Flow Rate (L/min): 1 L/min FiO2 (%): 24 %  Wt Readings from Last 3 Encounters:  12/01/19 65.9 kg  11/26/19 65.9 kg  10/20/19 77.1 kg     Intake/Output Summary (Last 24 hours) at 12/03/2019 1552 Last data filed at 12/03/2019 1200 Gross per 24 hour  Intake 700 ml  Output 1000 ml  Net -300 ml    Physical Exam:     Awake Alert, Oriented X 3, Normal affect No new F.N deficits,  [place].AT, Normal respiratory effort on room air, CTAB RRR,No Gallops,Rubs or new Murmurs, Bilateral BKA, staples in place-TKA, slight erythema right BKA without any obvious signs of drainage, no fluctuance Erythema, moisture in intertriginous areas Foley catheter in place +ve B.Sounds, Abd Soft, No tenderness, No rebound, guarding or rigidity.    I have personally reviewed the following:   Data Reviewed:  CBC Recent Labs  Lab 12/01/19 2238 12/02/19 0510 12/03/19 0658  WBC 13.5* 10.3 11.5*  HGB 9.9* 9.5* 9.8*  HCT 32.8* 31.8* 33.7*  PLT 746* 684* 653*  MCV 84.1 83.0 84.3  MCH 25.4* 24.8* 24.5*  MCHC  30.2 29.9* 29.1*  RDW 17.0* 16.9* 16.8*  LYMPHSABS 2.5  --   --   MONOABS 0.7  --   --   EOSABS 0.4  --   --   BASOSABS 0.1  --   --     Chemistries  Recent Labs  Lab 12/01/19 2238 12/02/19 0510 12/03/19 0658  NA 134* 136 137  K 4.6 4.2 4.6  CL 100 103 102  CO2 24 23 24   GLUCOSE 261* 237* 251*  BUN 21 21 18   CREATININE 1.27* 1.05 1.00  CALCIUM 8.7* 8.8* 9.1  MG  --  1.9  --   AST 14* 13* 12*  ALT 14 14 12   ALKPHOS 185* 179* 169*  BILITOT 0.4 0.5 0.5   ------------------------------------------------------------------------------------------------------------------ Recent Labs    12/02/19 0510  CHOL 168  HDL 39*  LDLCALC 80  TRIG 245*  CHOLHDL 4.3    Lab Results  Component Value Date   HGBA1C 8.5 (H) 11/05/2019   ------------------------------------------------------------------------------------------------------------------ No results for input(s): TSH, T4TOTAL, T3FREE, THYROIDAB in the last 72 hours.  Invalid input(s): FREET3 ------------------------------------------------------------------------------------------------------------------ No results for input(s): VITAMINB12, FOLATE, FERRITIN, TIBC, IRON, RETICCTPCT in the last 72 hours.  Coagulation profile Recent Labs  Lab 12/02/19 0510  INR 1.5*    No results for input(s): DDIMER in the last 72 hours.  Cardiac Enzymes No results for input(s): CKMB, TROPONINI, MYOGLOBIN in the last 168 hours.  Invalid input(s): CK ------------------------------------------------------------------------------------------------------------------ No results found for: BNP  Micro Results Recent Results (from the past 240 hour(s))  Culture, blood (routine x 2)     Status: None (Preliminary result)   Collection Time: 12/01/19 10:51 PM   Specimen: BLOOD RIGHT HAND  Result Value Ref Range Status   Specimen Description BLOOD RIGHT HAND  Final   Special Requests   Final    BOTTLES DRAWN AEROBIC AND ANAEROBIC Blood  Culture adequate volume   Culture   Final    NO GROWTH 2 DAYS Performed at [facility], [address], [place],  [address]    Report Status PENDING  Incomplete  Respiratory Panel by RT PCR (Flu A&B, Covid) - Nasopharyngeal Swab     Status: None   Collection Time: 12/01/19 11:00 PM   Specimen: Nasopharyngeal Swab  Result Value Ref Range Status   SARS Coronavirus 2 by RT PCR NEGATIVE NEGATIVE Final    Comment: (NOTE) SARS-CoV-2 target nucleic acids are NOT DETECTED.  The SARS-CoV-2 RNA is generally detectable in upper respiratoy specimens during the acute phase of infection. The lowest concentration of SARS-CoV-2 viral copies this assay can detect is 131 copies/mL. A negative result does not preclude SARS-Cov-2 infection and should not be used as the sole basis for treatment or other patient management decisions. A negative result may occur with  improper specimen collection/handling, submission of specimen other than nasopharyngeal swab, presence of viral mutation(s) within the areas targeted by this assay, and inadequate number of viral copies (<131 copies/mL). A negative result must be combined with clinical observations, patient history, and epidemiological information. The expected result is Negative.  Fact Sheet for Patients:  PinkCheek.be  Fact Sheet for Healthcare Providers:  GravelBags.it  This test is no t yet approved or cleared by the [place] FDA and  has been authorized for detection and/or diagnosis of SARS-CoV-2 by FDA under an Emergency Use Authorization (EUA). This EUA will remain  in effect (meaning this test can be used) for the duration of the COVID-19 declaration under Section 564(b)(1) of the Act, 21 U.S.C. section 360bbb-3(b)(1), unless the authorization is terminated or revoked sooner.     Influenza A by PCR NEGATIVE NEGATIVE Final   Influenza B by PCR NEGATIVE NEGATIVE Final     Comment: (NOTE) The Xpert Xpress SARS-CoV-2/FLU/RSV assay is intended as an aid in  the diagnosis of influenza from Nasopharyngeal swab specimens and  should not be used as a sole basis for treatment. Nasal washings and  aspirates are unacceptable for Xpert Xpress SARS-CoV-2/FLU/RSV  testing.  Fact Sheet for Patients: PinkCheek.be  Fact Sheet for Healthcare Providers: GravelBags.it  This test is not yet approved or cleared by the [place] FDA and  has been authorized for detection and/or diagnosis of SARS-CoV-2 by  FDA under an Emergency Use Authorization (EUA). This EUA will remain  in effect (meaning this test can be used) for the duration of the  Covid-19 declaration under Section 564(b)(1) of the Act, 21  U.S.C. section 360bbb-3(b)(1), unless the authorization is  terminated or revoked. Performed at [facility], [address] [place], [place] [address]   Culture, blood (routine x 2)     Status: None (Preliminary result)   Collection Time: 12/01/19 11:04 PM   Specimen: BLOOD RIGHT HAND  Result Value Ref Range Status   Specimen Description BLOOD RIGHT HAND  Final   Special Requests   Final    BOTTLES DRAWN AEROBIC AND ANAEROBIC Blood Culture adequate volume   Culture   Final    NO GROWTH 2 DAYS Performed at [facility], [address] [place], [place] [address]    Report Status PENDING  Incomplete  Urine culture     Status: Abnormal (Preliminary result)   Collection Time: 12/02/19  1:35 AM   Specimen: Urine, Clean Catch  Result Value Ref Range Status   Specimen Description   Final    URINE, CLEAN CATCH Performed at [facility], [address] [place], [place] [address]    Special Requests   Final    NONE Performed at [facility], [address], [place], [place] [address]    Culture (A)  Final    >=100,000 COLONIES/mL PSEUDOMONAS AERUGINOSA SUSCEPTIBILITIES TO FOLLOW CULTURE REINCUBATED FOR BETTER  GROWTH Performed at [facility], [place],  [place] [address]    Report Status PENDING  Incomplete    Radiology Reports CT ABDOMEN PELVIS WO CONTRAST  Result Date: 11/20/2019 CLINICAL DATA:  Abdominal pain and distension. EXAM: CT ABDOMEN AND PELVIS WITHOUT CONTRAST TECHNIQUE: Multidetector CT imaging of the abdomen and pelvis was performed following the standard protocol without IV contrast. COMPARISON:  CT 2 weeks ago 11/05/2019, pelvis MRI 01/25/2019 FINDINGS: Lower chest: Improved right but slightly worsening left pleural effusion. Adjacent atelectasis. High density pericardial fluid which has slightly diminished in volume from prior exam. There are coronary artery calcifications. Motion obscures detailed assessment. Hepatobiliary: No obvious focal liver abnormality, partially obscured by motion. Motion through the gallbladder without calcified gallstone. There is no biliary dilatation. Pancreas: Parenchymal atrophy. No ductal dilatation or inflammation. Spleen: Motion limited, no gross acute abnormality. Adrenals/Urinary Tract: Mild adrenal thickening without dominant nodule. Motion through the kidneys. There is no hydronephrosis. Mild bilateral renal parenchymal atrophy. Suggestion of mild symmetric perinephric edema, motion obscured. No visualized renal calculi. Urinary bladder is decompressed by catheter. Stomach/Bowel: Similar appearance of gaseous distension and redundancy of the sigmoid colon. Large stool burden in the ascending, transverse, and proximal descending colon. Mixed liquid and solid stool within the rectum. No obvious rectal wall thickening. Administered enteric contrast reaches the distal small bowel. Inguinal canals are patulous with small bowel approaching but not entering. No small bowel obstruction. Partially distended stomach, primarily decompressed. Vascular/Lymphatic: Aorto bi-iliac atherosclerosis. Aortic branch atherosclerosis. Small left inguinal nodes  largest measuring 9 mm short axis, unchanged. Reproductive: Brachytherapy seeds in the prostate gland. Other: No free air or ascites/free fluid. Small fat containing umbilical hernia. 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 Right hip arthroplasty. There is a small left hip joint effusion. Left hip osteoarthritis. Degenerative change in the lumbar spine with primarily facet hypertrophy. Enthesopathic change involving the hamstring insertions. No acute osseous abnormalities. IMPRESSION: 1. No acute abnormality in the abdomen/pelvis. 2. Large stool burden with gaseous distension and redundancy of the sigmoid colon, similar to prior exam. Mixed liquid and solid stool within the rectum without obvious rectal wall thickening. Overall findings consistent with constipation. There is no bowel obstruction. 3. Improved right but slightly worsening small left pleural effusion. High density pericardial fluid has slightly diminished in volume from prior exam. 4. Diminished right inguinal hernia, inguinal canal appears patulous on the current exam. No bowel involvement currently. 5. Chronic osteomyelitis of the pubic symphysis. Aortic Atherosclerosis (ICD10-I70.0). Electronically Signed   By: Keith Rake M.D.   On: 11/20/2019 23:40   DG Chest 1 View  Result Date: 12/01/2019 CLINICAL DATA:  Altered mental status EXAM: CHEST  1 VIEW COMPARISON:  11/19/2019 FINDINGS: Cardiac shadow is within normal limits. Tortuous thoracic aorta is noted. The lungs are well aerated without focal infiltrate or sizable effusion. Degenerative changes of the thoracic spine are noted. IMPRESSION: No active disease. Electronically Signed   By: Inez Catalina M.D.   On: 12/01/2019 23:53   DG Abd 1 View  Result Date: 11/08/2019 CLINICAL DATA:  Follow-up for abdominal distension EXAM: ABDOMEN - 1 VIEW COMPARISON:  None. FINDINGS: Gaseous distension of the small bowel and colon.  Moderate amount of stool throughout the colon. No evidence of pneumoperitoneum, portal venous gas or pneumatosis. No pathologic calcifications along the expected course of the ureters. No acute osseous abnormality. IMPRESSION: Gaseous distension of the small bowel and colon as can be seen with an ileus. Electronically Signed   By: Kathreen Devoid  On: 11/08/2019 15:47   CT Head Wo Contrast  Result Date: 12/01/2019 CLINICAL DATA:  Altered mental status, slurred speech EXAM: CT HEAD WITHOUT CONTRAST TECHNIQUE: Contiguous axial images were obtained from the base of the skull through the vertex without intravenous contrast. COMPARISON:  MRI 08/20/2013 (images only) CT head 08/10/2013 (report only) FINDINGS: Brain: No evidence of acute infarction, hemorrhage, hydrocephalus, extra-axial collection, visible mass lesion or mass effect. Symmetric prominence of the ventricles, cisterns and sulci compatible with parenchymal volume loss. Patchy areas of white matter hypoattenuation are most compatible with chronic microvascular angiopathy. Vascular: Atherosclerotic calcification of the carotid siphons and intradural vertebral arteries. No hyperdense vessel. Skull: No calvarial fracture or suspicious osseous lesion. No scalp swelling or hematoma. Sinuses/Orbits: Paranasal sinuses and mastoid air cells are predominantly clear. Orbital structures are unremarkable aside from prior lens extractions. Other: Debris in the right external auditory canal. IMPRESSION: 1. No acute intracranial findings. If there is persisting concern for acute infarction, MRI is more sensitive and specific for early features of ischemia. 2. Chronic microvascular angiopathy and parenchymal volume loss. 3. Debris in the right external auditory canal, correlate for cerumen impaction. Electronically Signed   By: Lovena Le M.D.   On: 12/01/2019 23:29   MR BRAIN WO CONTRAST  Result Date: 12/02/2019 CLINICAL DATA:  Altered mental status, weakness EXAM:  MRI HEAD WITHOUT CONTRAST TECHNIQUE: Multiplanar, multiecho pulse sequences of the brain and surrounding structures were obtained without intravenous contrast. COMPARISON:  2015 FINDINGS: Brain: There is no acute infarction or intracranial hemorrhage. There is no intracranial mass, mass effect, or edema. There is no hydrocephalus or extra-axial fluid collection. Ventricles and sulci prominent compatible with generalized parenchymal volume loss, which has progressed since 2015. Patchy and confluent areas of T2 hyperintensity in the supratentorial greater than pontine white matter are nonspecific but probably reflect moderate chronic microvascular ischemic changes, which have also progressed. Vascular: Major vessel flow voids at the skull base are preserved. Skull and upper cervical spine: Normal marrow signal is preserved. Sinuses/Orbits: Trace mucosal thickening.  Orbits are unremarkable. Other: Sella is unremarkable.  Bilateral lens replacements. IMPRESSION: No acute infarction, hemorrhage, or mass. Progression of chronic microvascular ischemic changes and parenchymal volume loss since 2015. Electronically Signed   By: Macy Mis M.D.   On: 12/02/2019 09:01   CT Abdomen Pelvis W Contrast  Result Date: 11/05/2019 CLINICAL DATA:  Bowel obstruction, pubic symphyseal osteomyelitis EXAM: CT ABDOMEN AND PELVIS WITH CONTRAST TECHNIQUE: Multidetector CT imaging of the abdomen and pelvis was performed using the standard protocol following bolus administration of intravenous contrast. CONTRAST:  193mL OMNIPAQUE IOHEXOL 300 MG/ML  SOLN COMPARISON:  None. FINDINGS: Lower chest: Small bilateral pleural effusions are present with associated mild bibasilar compressive atelectasis. Small pericardial effusion is present, new since prior examination, with associated pericardial enhancement suggesting a complex/exudative effusion. Cardiac size is within normal limits. No CT evidence of cardiac tamponade. Extensive distal  right coronary artery calcification. Hepatobiliary: No focal liver abnormality is seen. No gallstones, gallbladder wall thickening, or biliary dilatation. Pancreas: Moderate atrophy, but otherwise unremarkable. Spleen: Unremarkable Adrenals/Urinary Tract: The adrenal glands are unremarkable. The kidneys are normal in size and position. Tiny exophytic cortical cyst within the upper pole of the left kidney. The kidneys are otherwise unremarkable. The bladder is decompressed with a Foley catheter balloon seen within its lumen. Stomach/Bowel: Stomach and small bowel are unremarkable. The sigmoid colon is markedly redundant, however, there is no evidence of obstruction or focal inflammation. Moderate stool within the ascending  and transverse colon. The appendix is not visualized and is likely absent. There is no free intraperitoneal gas or fluid. Vascular/Lymphatic: No pathologic adenopathy within the abdomen and pelvis. There is mild aortoiliac atherosclerotic calcification noted without evidence of aneurysm. Particularly prominent calcification, however, is seen at the origin of the mesenteric and renal vasculature, though the degree of stenosis is not well assessed on this non arteriographic study. Reproductive: Brachytherapy seeds are seen within the prostate gland. Other: Small right inguinal hernia is present containing portions of a distal small bowel and the cecum as well as a small amount of mesenteric fat. Tiny fat containing left inguinal hernia is present. Musculoskeletal: Right total hip arthroplasty has been performed. Moderate left hip degenerative arthritis. Mixed lytic and sclerotic changes involving the pubic symphyses are compatible with the given history of symphyseal osteomyelitis. Calcifications seen at the a hamstring origins likely relates to remote trauma or inflammation. Degenerative changes are seen within the lumbar spine. No lytic or blastic bone lesions are seen. IMPRESSION: 1. Small  pericardial effusion with associated pericardial enhancement suggesting a complex/exudative effusion. No CT evidence of cardiac tamponade. 2. Small bilateral pleural effusions. 3. Small right inguinal hernia containing portions of a distal small bowel and cecum. No evidence of bowel obstruction. 4. 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 Aortic Atherosclerosis (ICD10-I70.0). Electronically Signed   By: Fidela Salisbury MD   On: 11/05/2019 15:44   MR FOOT LEFT W WO CONTRAST  Result Date: 11/08/2019 CLINICAL DATA:  Osteomyelitis of the foot. Plain foot soft tissue wound EXAM: MRI OF THE LEFT FOREFOOT WITHOUT AND WITH CONTRAST TECHNIQUE: Multiplanar, multisequence MR imaging of the left foot was performed both before and after administration of intravenous contrast. CONTRAST:  74mL GADAVIST GADOBUTROL 1 MMOL/ML IV SOLN COMPARISON:  None. FINDINGS: TENDONS Peroneal: Peroneal longus tendon intact. Peroneal brevis intact. Posteromedial: Posterior tibial tendon intact. Flexor hallucis longus tendon intact. Flexor digitorum longus tendon intact. Anterior: Tibialis anterior tendon intact. Extensor hallucis longus tendon intact Extensor digitorum longus tendon intact. Achilles:  Mild tendinosis of the distal Achilles tendon. Plantar Fascia: Thickening of the medial band of the plantar fascia at the calcaneal insertion as can be seen with mild plantar fasciitis. LIGAMENTS Lateral: Anterior talofibular ligament intact. Calcaneofibular ligament intact. Posterior talofibular ligament intact. Anterior and posterior tibiofibular ligaments intact. Medial: Deltoid ligament intact. Spring ligament intact. CARTILAGE Ankle Joint: No joint effusion. Normal ankle mortise. No chondral defect. Subtalar Joints/Sinus Tarsi: Normal subtalar joints. No subtalar joint effusion. Normal sinus tarsi. Bones/Soft Tissue: Soft tissue wound along the posterolateral  aspect of the hindfoot overlying the posterior calcaneus. Underlying cortical destruction of the posterolateral calcaneus just inferior to the Achilles insertion with severe surrounding marrow edema most consistent with osteomyelitis. No drainable fluid collection. Soft tissue edema throughout the hindfoot extending into the ankle concerning for cellulitis. T2 hyperintense signal throughout the plantar musculature likely neurogenic. IMPRESSION: 1. Soft tissue wound along the posterolateral aspect of the hindfoot overlying the posterior calcaneus. Underlying cortical destruction of the posterolateral calcaneus just inferior to the Achilles insertion with severe surrounding marrow edema most consistent with osteomyelitis with surrounding cellulitis. No drainable fluid collection. 2. Mild tendinosis of the distal Achilles tendon. 3. Thickening of the medial band of the plantar fascia at the calcaneal insertion as can be seen with mild plantar fasciitis. Electronically Signed   By: Kathreen Devoid   On: 11/08/2019 11:46   US Carotid Bilateral  Result  Date: 12/02/2019 CLINICAL DATA:  Concern for stroke. EXAM: BILATERAL CAROTID DUPLEX ULTRASOUND TECHNIQUE: Pearline Cables scale imaging, color Doppler and duplex ultrasound were performed of bilateral carotid and vertebral arteries in the neck. COMPARISON:  None. FINDINGS: Criteria: Quantification of carotid stenosis is based on velocity parameters that correlate the residual internal carotid diameter with NASCET-based stenosis levels, using the diameter of the distal internal carotid lumen as the denominator for stenosis measurement. The following velocity measurements were obtained: RIGHT ICA: 71/12 cm/sec CCA: 35/00 cm/sec SYSTOLIC ICA/CCA RATIO:  1.0 ECA: 91 cm/sec LEFT ICA: 66/22 cm/sec CCA: 93/81 cm/sec SYSTOLIC ICA/CCA RATIO:  0.8 ECA: 69 cm/sec RIGHT CAROTID ARTERY: Echogenic plaque in the distal common carotid artery and carotid bulb. External carotid artery is patent with  normal waveform. Normal waveforms and velocities in the internal carotid artery. Distal internal carotid artery is very tortuous. RIGHT VERTEBRAL ARTERY: Antegrade flow and normal waveform in the right vertebral artery. LEFT CAROTID ARTERY: Small amount of heterogeneous plaque in the proximal left common carotid artery. Small amount of plaque at the left carotid bulb. External carotid artery is patent with normal waveform. Normal waveforms and velocities in the internal carotid artery. LEFT VERTEBRAL ARTERY: Antegrade flow and normal waveform in the left vertebral artery. IMPRESSION: 1. Mild atherosclerotic disease involving the carotid arteries without significant stenosis. Estimated degree of stenosis in the internal carotid arteries is less than 50% bilaterally. 2. Patent vertebral arteries with antegrade flow. Electronically Signed   By: Markus Daft M.D.   On: 12/02/2019 13:34   DG Chest Port 1 View  Result Date: 11/19/2019 CLINICAL DATA:  Fever. EXAM: PORTABLE CHEST 1 VIEW COMPARISON:  11/05/2019 FINDINGS: Borderline cardiomegaly. Unchanged mediastinal contours. Mild chronic elevation of left hemidiaphragm with adjacent atelectasis. Confluent consolidation. No pulmonary edema. No pneumothorax. No significant pleural effusion. Stable osseous structures with chronic degenerative change of the left shoulder. IMPRESSION: 1. No evidence of pneumonia.  Borderline cardiomegaly. 2. Chronic elevation of left hemidiaphragm with adjacent atelectasis. Electronically Signed   By: Keith Rake M.D.   On: 11/19/2019 17:53   DG Chest Port 1 View  Result Date: 11/05/2019 CLINICAL DATA:  New onset AFib EXAM: PORTABLE CHEST 1 VIEW COMPARISON:  June 09, 2019 FINDINGS: The cardiomediastinal silhouette is unchanged in contour when accounting for differences in technique.Trace fluid in the RIGHT minor fissure. No pleural effusion. No pneumothorax. No acute pleuroparenchymal abnormality. Gaseous distension of the colon.  Multilevel degenerative changes of the thoracic spine. IMPRESSION: No acute cardiopulmonary abnormality. Electronically Signed   By: Valentino Saxon MD   On: 11/05/2019 12:26   ECHOCARDIOGRAM COMPLETE  Result Date: 11/06/2019    ECHOCARDIOGRAM REPORT   Patient Name:   Habeeb D Rivadeneira Date of Exam: 11/06/2019 Medical Rec #:  [id number]      Height:       68.0 in Accession #:    [id number]     Weight:       170.0 lb Date of Birth:  [birth year]      BSA:          1.907 m Patient Age:    26 years       BP:           103/75 mmHg Patient Gender: M              HR:           78 bpm. Exam Location:  [facility] Procedure: 2D Echo, Cardiac Doppler and Color Doppler Indications:  Prostate Cancer  History:        Patient has prior history of Echocardiogram examinations, most                 recent 01/23/2018. Risk Factors:Dyslipidemia and Diabetes.                 Prostate Cancer.  Sonographer:    Alvino Chapel RCS Referring Phys: [id number] DAWOOD S [employer]  Sonographer Comments: Technically difficult study due to poor echo windows. IMPRESSIONS  1. Left ventricular ejection fraction, by estimation, is 60 to 65%. The left ventricle has normal function. Left ventricular endocardial border not optimally defined to evaluate regional wall motion. There is mild left ventricular hypertrophy. Left ventricular diastolic parameters are indeterminate.  2. Right ventricular systolic function is normal. The right ventricular size is normal.  3. Left atrial size was mildly dilated.  4. The pericardial effusion is circumferential.  5. The mitral valve is normal in structure. Trivial mitral valve regurgitation. No evidence of mitral stenosis.  6. The aortic valve was not well visualized. Aortic valve regurgitation is not visualized. No aortic stenosis is present. FINDINGS  Left Ventricle: Left ventricular ejection fraction, by estimation, is 60 to 65%. The left ventricle has normal function. Left ventricular endocardial border not optimally  defined to evaluate regional wall motion. The left ventricular internal cavity size was normal in size. There is mild left ventricular hypertrophy. Left ventricular diastolic parameters are indeterminate. Right Ventricle: The right ventricular size is normal. No increase in right ventricular wall thickness. Right ventricular systolic function is normal. Left Atrium: Left atrial size was mildly dilated. Right Atrium: Right atrial size was normal in size. Pericardium: Trivial pericardial effusion is present. The pericardial effusion is circumferential. Mitral Valve: The mitral valve is normal in structure. Trivial mitral valve regurgitation. No evidence of mitral valve stenosis. Tricuspid Valve: The tricuspid valve is normal in structure. Tricuspid valve regurgitation is not demonstrated. No evidence of tricuspid stenosis. Aortic Valve: The aortic valve was not well visualized. Aortic valve regurgitation is not visualized. No aortic stenosis is present. Aortic valve mean gradient measures 3.3 mmHg. Aortic valve peak gradient measures 7.2 mmHg. Aortic valve area, by VTI measures 2.80 cm. Pulmonic Valve: The pulmonic valve was not well visualized. Pulmonic valve regurgitation is not visualized. No evidence of pulmonic stenosis. Aorta: The aortic root is normal in size and structure. Pulmonary Artery: Indeterminant PASP, inadequate TR jet. Venous: The inferior vena cava was not well visualized. IAS/Shunts: The interatrial septum was not well visualized.  LEFT VENTRICLE PLAX 2D LVIDd:         4.18 cm LVIDs:         2.44 cm LV PW:         1.28 cm LV IVS:        1.12 cm LVOT diam:     2.00 cm LV SV:         56 LV SV Index:   29 LVOT Area:     3.14 cm  RIGHT VENTRICLE RV S prime:     13.00 cm/s TAPSE (M-mode): 1.4 cm LEFT ATRIUM             Index       RIGHT ATRIUM           Index LA diam:        2.60 cm 1.36 cm/m  RA Area:     15.90 cm LA Vol (A2C):   57.8 ml 30.30 ml/m RA Volume:  38.20 ml  20.03 ml/m LA Vol (A4C):    58.6 ml 30.72 ml/m LA Biplane Vol: 59.0 ml 30.93 ml/m  AORTIC VALVE AV Area (Vmax):    2.36 cm AV Area (Vmean):   2.29 cm AV Area (VTI):     2.80 cm AV Vmax:           134.16 cm/s AV Vmean:          83.194 cm/s AV VTI:            0.199 m AV Peak Grad:      7.2 mmHg AV Mean Grad:      3.3 mmHg LVOT Vmax:         100.60 cm/s LVOT Vmean:        60.700 cm/s LVOT VTI:          0.178 m LVOT/AV VTI ratio: 0.89  AORTA Ao Root diam: 3.00 cm MITRAL VALVE                TRICUSPID VALVE MV Area (PHT): 3.61 cm     TR Peak grad:   22.3 mmHg MV Decel Time: 210 msec     TR Vmax:        236.00 cm/s MV E velocity: 123.00 cm/s                             SHUNTS                             Systemic VTI:  0.18 m                             Systemic Diam: 2.00 cm Carlyle Dolly MD Electronically signed by Carlyle Dolly MD Signature Date/Time: 11/06/2019/11:30:42 AM    Final    ECHOCARDIOGRAM LIMITED  Result Date: 12/02/2019    ECHOCARDIOGRAM LIMITED REPORT   Patient Name:   Jashawn D Cottingham Date of Exam: 12/02/2019 Medical Rec #:  [id number]      Height:       68.0 in Accession #:    [id number]     Weight:       145.3 lb Date of Birth:  06-Dec-1933      BSA:          1.784 m Patient Age:    63 years       BP:           138/77 mmHg Patient Gender: M              HR:           88 bpm. Exam Location:  [facility] Procedure: Limited Echo Indications:    Stroke 434.91 / I163.9  History:        Patient has prior history of Echocardiogram examinations, most                 recent 11/06/2019. Arrythmias:Atrial Flutter and Atrial                 Fibrillation; Risk Factors:Dyslipidemia, Diabetes and                 Non-Smoker. Prostate cancer.  Sonographer:    Leavy Cella RDCS (AE) Referring Phys: [id number] OLADAPO ADEFESO IMPRESSIONS  1. Left ventricular ejection fraction, by estimation, is 55 to 60%. The left ventricle has normal function.  Left ventricular endocardial border not optimally defined to evaluate regional wall motion. There  is moderate left ventricular hypertrophy. Left ventricular diastolic parameters are indeterminate.  2. Right ventricular systolic function is normal. The right ventricular size is normal. Tricuspid regurgitation signal is inadequate for assessing PA pressure.  3. The mitral valve is grossly normal. Trivial mitral valve regurgitation. Moderate mitral annular calcification.  4. The aortic valve is tricuspid. Aortic valve regurgitation is not visualized.  5. Unable to estimate CVP. FINDINGS  Left Ventricle: Left ventricular ejection fraction, by estimation, is 55 to 60%. The left ventricle has normal function. Left ventricular endocardial border not optimally defined to evaluate regional wall motion. The left ventricular internal cavity size was normal in size. There is moderate left ventricular hypertrophy. Left ventricular diastolic parameters are indeterminate. Right Ventricle: The right ventricular size is normal. No increase in right ventricular wall thickness. Right ventricular systolic function is normal. Tricuspid regurgitation signal is inadequate for assessing PA pressure. Pericardium: There is no evidence of pericardial effusion. Presence of pericardial fat pad. Mitral Valve: The mitral valve is grossly normal. Moderate mitral annular calcification. Trivial mitral valve regurgitation. Tricuspid Valve: The tricuspid valve is not well visualized. Tricuspid valve regurgitation is trivial. Aortic Valve: The aortic valve is tricuspid. There is mild aortic valve annular calcification. Aortic valve regurgitation is not visualized. Pulmonic Valve: The pulmonic valve was not well visualized. Pulmonic valve regurgitation is trivial. Aorta: The aortic root is normal in size and structure. Venous: Unable to estimate CVP. The inferior vena cava was not well visualized. IAS/Shunts: No atrial level shunt detected by color flow Doppler. LEFT VENTRICLE PLAX 2D LVIDd:         2.90 cm LVIDs:         2.27 cm LV PW:         1.45  cm LV IVS:        1.25 cm LVOT diam:     2.10 cm LVOT Area:     3.46 cm  LEFT ATRIUM         Index LA diam:    3.50 cm 1.96 cm/m   AORTA Ao Root diam: 3.00 cm MITRAL VALVE MV Area (PHT): 3.85 cm     SHUNTS MV Decel Time: 197 msec     Systemic Diam: 2.10 cm MV E velocity: 56.60 cm/s MV A velocity: 101.00 cm/s MV E/A ratio:  0.56 Rozann Lesches MD Electronically signed by Rozann Lesches MD Signature Date/Time: 12/02/2019/11:17:33 AM    Final      Time Spent in minutes  30     Desiree Hane M.D on 12/03/2019 at 3:52 PM  To page go to [email or web address] - password [email or web address]

## 2019-12-04 DIAGNOSIS — L89301 Pressure ulcer of unspecified buttock, stage 1: Secondary | ICD-10-CM

## 2019-12-04 LAB — URINE CULTURE: Culture: >=100000 — AB

## 2019-12-04 LAB — CBC
HCT: 28.9 % — ABNORMAL LOW (ref 39.0–52.0)
Hemoglobin: 8.9 g/dL — ABNORMAL LOW (ref 13.0–17.0)
MCH: 25.1 pg — ABNORMAL LOW (ref 26.0–34.0)
MCHC: 30.8 g/dL (ref 30.0–36.0)
MCV: 81.6 fL (ref 80.0–100.0)
Platelets: 542 10*3/uL — ABNORMAL HIGH (ref 150–400)
RBC: 3.54 MIL/uL — ABNORMAL LOW (ref 4.22–5.81)
RDW: 16.9 % — ABNORMAL HIGH (ref 11.5–15.5)
WBC: 9.5 10*3/uL (ref 4.0–10.5)
nRBC: 0 % (ref 0.0–0.2)

## 2019-12-04 LAB — COMPREHENSIVE METABOLIC PANEL
ALT: 9 U/L (ref 0–44)
AST: 10 U/L — ABNORMAL LOW (ref 15–41)
Albumin: 2.6 g/dL — ABNORMAL LOW (ref 3.5–5.0)
Alkaline Phosphatase: 146 U/L — ABNORMAL HIGH (ref 38–126)
Anion gap: 9 (ref 5–15)
BUN: 23 mg/dL (ref 8–23)
CO2: 22 mmol/L (ref 22–32)
Calcium: 8.9 mg/dL (ref 8.9–10.3)
Chloride: 102 mmol/L (ref 98–111)
Creatinine, Ser: 0.91 mg/dL (ref 0.61–1.24)
GFR, Estimated: >60 mL/min (ref >60)
Glucose, Bld: 251 mg/dL — ABNORMAL HIGH (ref 70–99)
Potassium: 3.6 mmol/L (ref 3.5–5.1)
Sodium: 133 mmol/L — ABNORMAL LOW (ref 135–145)
Total Bilirubin: 0.5 mg/dL (ref 0.3–1.2)
Total Protein: 6.6 g/dL (ref 6.5–8.1)

## 2019-12-04 LAB — GLUCOSE, CAPILLARY
Glucose-Capillary: 232 mg/dL — ABNORMAL HIGH (ref 70–99)
Glucose-Capillary: 317 mg/dL — ABNORMAL HIGH (ref 70–99)
Glucose-Capillary: 315 mg/dL — ABNORMAL HIGH (ref 70–99)
Glucose-Capillary: 275 mg/dL — ABNORMAL HIGH (ref 70–99)

## 2019-12-04 NOTE — Progress Notes (Addendum)
[facility]  PROGRESS NOTE  Anthony Dunlap YBO:[id number] DOB: 1933-11-04 DOA: 12/01/2019 PCP: [name], MD Admit date - 12/01/2019   Admitting Physician Desiree Hane, MD  Outpatient Primary MD for the patient is [name], MD  LOS - 2 Brief Narrative   Anthony Dunlap is a 86 y.o. 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 fib who presented on 10/12 with altered mental status and concern for new left-sided weakness and was found to have leukocytosis, thrombocytosis, tachypnea, hyperglycemia, creatinine of 1.27 concerning for sepsis secondary to presumed UTI given abnormal UA in the setting of chronic indwelling Foley.  Patient was started on IV ceftriaxone.  Telemetry neurology was also consulted due to concern for stroke work-up.   MRI brain was negative for any acute intracranial pathology.  Discussed case with urology who recommended simple catheter exchange at bedside given concern for UTI  Subjective  Today feels great.  Denies any pain.  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  Foley usually exchanged monthly, last documented exchange on 9/22 (over wire via cystoscopy by urology given concern for retraction of Foley into the prostate) prior to admission.  Status post exchange on 10/13 (#16) given urethral stricture as recommended by urology over the phone  Acute encephalopathy, suspect infectious, resolved currently alert to self, place, but not to context.  Following commands.  MRI brain unremarkable.  Suspect being driven by possible UTI given concerning UA, chronic indwelling Foley with an otherwise unexplained cause in change of mentation -Continue closely monitor neuro status -Tolerated transition from IV cefepime to ciprofloxacin  Sepsis secondary to complicated UTI, secondary  to Pseudomonas aeruginosa in setting of chronic indwelling catheter ( poa).  Presented with tachypnea, leukocytosis positive UA with chronic indwelling Foley and changes in mentation.  White count downtrending, remains afebrile and has remained clinically stable with transition to ciprofloxacin.  Pseudomonas sensitive to Cipro based off urine cultures.  Urine turbid with some sedimentation and brown discoloration concerning for early signs of dehydration given patient admits to decreased oral intake -Continue ciprofloxacin for total of 7 days; today is Day 3 -blood cultures unremarkable  -Foley catheter already exchanged -Encourage oral hydration, closely monitor additional 24 hours if remains without AKI and improvement appetite expect discharge in 24 hours  Left BKA, 11/11/2019 for treatment of left calcaneal osteomyelitis.  Staples in place some slight erythema but no obvious drainage, no fluctuance, no pain.  No current signs of infection, completed IV course of antibiotics -Continue to monitor  Intertriginous yeast infection.  Open wound oral antifungal given elevation LFTs in the past. -Nystatin powder  Type 2 diabetes with hyperglycemia and peripheral neuropathy, A1c 8.5%.  Poorly controlled blood sugars in the 300s -Home medications NovoLog 3 to 15 units 3 times daily, Basaglar 4 units daily -Continue sliding scale insulin, increased Lantus to 10 units -Continue home Elavil  Prostate cancer status post brachytherapy in 2009.  PSA < 0.01 on 0/23/21  Sacral decubitus ulcer, stage 2, present on admission Seems to be stage II on my examination loss of dermis with some shallow ulcerations but no full-thickness tissue loss -Continue wound care, frequent turning  Paroxysmal atrial fibrillation/flutter, currently rate controlled.  Currently in normal sinus rhythm -Eliquis per pharmacy (home regimen -Continue amiodarone -Toprol, diltiazem   PAD.  Status post bilateral BKA. -Continue Plavix  and  statin  Hypothyroidism, stable -Continue Synthroid  GERD, stable -Continue PPI  OAB --continue home oxybutynin       Family Communication  : Spoke to daughter  Ms. [name] on 10/15  Code Status : Full code, discussed on day of admission  Disposition Plan  :  Patient is from home. Anticipated d/c date:  24 hours. Barriers to d/c or necessity for inpatient status:  Patient/family agreeable to home health with physical therapy, tolerating transition to oral ciprofloxacin, would like to monitor additional 24 hours given somewhat turbid brown discoloration to urine concerning for signs of dehydration given patient has somewhat diminished oral intake will like to monitor patient's ability to maintain oral hydration without IV support. Consults  : Urology, neurology  Procedures  :    DVT Prophylaxis  : Home Eliquis  MDM: The below labs and imaging reports were reviewed and summarized above.  Medication management as above.  Lab Results  Component Value Date   PLT 542 (H) 12/04/2019    Diet :  Diet Order            Diet renal/carb modified with fluid restriction Diet-HS Snack? Nothing; Fluid restriction: 1200 mL Fluid; Room service appropriate? Yes; Fluid consistency: Thin  Diet effective now                  Inpatient Medications Scheduled Meds: . amiodarone  200 mg Oral BID  . amitriptyline  20 mg Oral QHS  . apixaban  5 mg Oral BID  . aspirin EC  81 mg Oral Daily  . Chlorhexidine Gluconate Cloth  6 each Topical Daily  . ciprofloxacin  500 mg Oral BID  . clopidogrel  75 mg Oral Daily  . diltiazem  300 mg Oral Daily  . feeding supplement (GLUCERNA SHAKE)  237 mL Oral TID BM  . ferrous sulfate  325 mg Oral BID WC  . insulin aspart  0-15 Units Subcutaneous TID WC  . insulin aspart  0-5 Units Subcutaneous QHS  . insulin glargine  10 Units Subcutaneous Daily  . levothyroxine  100 mcg Oral Q breakfast  . metoprolol succinate  100 mg Oral Daily  . multivitamin with  minerals  1 tablet Oral Daily  . nutrition supplement (JUVEN)  1 packet Oral BID BM  . nystatin   Topical TID  . oxybutynin  5 mg Oral QHS  . pantoprazole  40 mg Oral Daily  . pravastatin  10 mg Oral Q supper  . tobramycin  1 drop Both Eyes Q4H   Continuous Infusions:  PRN Meds:.acetaminophen **OR** acetaminophen, colchicine, senna-docusate  Antibiotics  :   Anti-infectives (From admission, onward)   Start     Dose/Rate Route Frequency Ordered Stop   12/03/19 1700  ciprofloxacin (CIPRO) tablet 500 mg        500 mg Oral 2 times daily 12/03/19 1608     12/03/19 0200  cefTRIAXone (ROCEPHIN) 1 g in sodium chloride 0.9 % 100 mL IVPB  Status:  Discontinued        1 g 200 mL/hr over 30 Minutes Intravenous Every 24 hours 12/02/19 0510 12/02/19 1104   12/02/19 1200  ceFEPIme (MAXIPIME) 2 g in sodium chloride 0.9 % 100 mL IVPB  Status:  Discontinued        2 g 200 mL/hr over 30 Minutes Intravenous Every 24 hours 12/02/19 1104 12/03/19 1608   12/02/19 0145  cefTRIAXone (ROCEPHIN) 2 g in sodium chloride 0.9 % 100 mL IVPB  2 g 200 mL/hr over 30 Minutes Intravenous  Once 12/02/19 0134 12/02/19 0340       Objective   Vitals:   12/03/19 2109 12/03/19 2116 12/04/19 0100 12/04/19 1356  BP: 116/67  120/70 126/90  Pulse: 79  82 96  Resp: 16  18 17   Temp: 97.7 F (36.5 C)  97.7 F (36.5 C) 98.7 F (37.1 C)  TempSrc: Oral  Oral   SpO2: 97% 93% 95% 98%  Weight:      Height:        SpO2: 98 % O2 Flow Rate (L/min): 1 L/min FiO2 (%): 24 %  Wt Readings from Last 3 Encounters:  12/01/19 65.9 kg  11/26/19 65.9 kg  10/20/19 77.1 kg     Intake/Output Summary (Last 24 hours) at 12/04/2019 1624 Last data filed at 12/04/2019 1300 Gross per 24 hour  Intake 480 ml  Output --  Net 480 ml    Physical Exam:     Awake Alert, Oriented X 3, Normal affect No new F.N deficits,  [place].AT, Normal respiratory effort on room air, CTAB RRR,No Gallops,Rubs or new Murmurs, Bilateral BKA,  staples in place-TKA, slight erythema right BKA without any obvious signs of drainage, no fluctuance Erythema, moisture in intertriginous areas Foley catheter in place draining brown-colored urine with sediments +ve B.Sounds, Abd Soft, No tenderness, No rebound, guarding or rigidity.    I have personally reviewed the following:   Data Reviewed:  CBC Recent Labs  Lab 12/01/19 2238 12/02/19 0510 12/03/19 0658 12/04/19 0512  WBC 13.5* 10.3 11.5* 9.5  HGB 9.9* 9.5* 9.8* 8.9*  HCT 32.8* 31.8* 33.7* 28.9*  PLT 746* 684* 653* 542*  MCV 84.1 83.0 84.3 81.6  MCH 25.4* 24.8* 24.5* 25.1*  MCHC 30.2 29.9* 29.1* 30.8  RDW 17.0* 16.9* 16.8* 16.9*  LYMPHSABS 2.5  --   --   --   MONOABS 0.7  --   --   --   EOSABS 0.4  --   --   --   BASOSABS 0.1  --   --   --     Chemistries  Recent Labs  Lab 12/01/19 2238 12/02/19 0510 12/03/19 0658 12/04/19 0512  NA 134* 136 137 133*  K 4.6 4.2 4.6 3.6  CL 100 103 102 102  CO2 24 23 24 22   GLUCOSE 261* 237* 251* 251*  BUN 21 21 18 23   CREATININE 1.27* 1.05 1.00 0.91  CALCIUM 8.7* 8.8* 9.1 8.9  MG  --  1.9  --   --   AST 14* 13* 12* 10*  ALT 14 14 12 9   ALKPHOS 185* 179* 169* 146*  BILITOT 0.4 0.5 0.5 0.5   ------------------------------------------------------------------------------------------------------------------ Recent Labs    12/02/19 0510  CHOL 168  HDL 39*  LDLCALC 80  TRIG 245*  CHOLHDL 4.3    Lab Results  Component Value Date   HGBA1C 8.5 (H) 11/05/2019   ------------------------------------------------------------------------------------------------------------------ No results for input(s): TSH, T4TOTAL, T3FREE, THYROIDAB in the last 72 hours.  Invalid input(s): FREET3 ------------------------------------------------------------------------------------------------------------------ No results for input(s): VITAMINB12, FOLATE, FERRITIN, TIBC, IRON, RETICCTPCT in the last 72 hours.  Coagulation profile Recent  Labs  Lab 12/02/19 0510  INR 1.5*    No results for input(s): DDIMER in the last 72 hours.  Cardiac Enzymes No results for input(s): CKMB, TROPONINI, MYOGLOBIN in the last 168 hours.  Invalid input(s): CK ------------------------------------------------------------------------------------------------------------------ No results found for: BNP  Micro Results Recent Results (from the past 240 hour(s))  Culture, blood (  routine x 2)     Status: None (Preliminary result)   Collection Time: 12/01/19 10:51 PM   Specimen: BLOOD RIGHT HAND  Result Value Ref Range Status   Specimen Description BLOOD RIGHT HAND  Final   Special Requests   Final    BOTTLES DRAWN AEROBIC AND ANAEROBIC Blood Culture adequate volume   Culture   Final    NO GROWTH 3 DAYS Performed at [facility], [address], [place], [place] [address]    Report Status PENDING  Incomplete  Respiratory Panel by RT PCR (Flu A&B, Covid) - Nasopharyngeal Swab     Status: None   Collection Time: 12/01/19 11:00 PM   Specimen: Nasopharyngeal Swab  Result Value Ref Range Status   SARS Coronavirus 2 by RT PCR NEGATIVE NEGATIVE Final    Comment: (NOTE) SARS-CoV-2 target nucleic acids are NOT DETECTED.  The SARS-CoV-2 RNA is generally detectable in upper respiratoy specimens during the acute phase of infection. The lowest concentration of SARS-CoV-2 viral copies this assay can detect is 131 copies/mL. A negative result does not preclude SARS-Cov-2 infection and should not be used as the sole basis for treatment or other patient management decisions. A negative result may occur with  improper specimen collection/handling, submission of specimen other than nasopharyngeal swab, presence of viral mutation(s) within the areas targeted by this assay, and inadequate number of viral copies (<131 copies/mL). A negative result must be combined with clinical observations, patient history, and epidemiological information. The expected  result is Negative.  Fact Sheet for Patients:  PinkCheek.be  Fact Sheet for Healthcare Providers:  GravelBags.it  This test is no t yet approved or cleared by the [place] FDA and  has been authorized for detection and/or diagnosis of SARS-CoV-2 by FDA under an Emergency Use Authorization (EUA). This EUA will remain  in effect (meaning this test can be used) for the duration of the COVID-19 declaration under Section 564(b)(1) of the Act, 21 U.S.C. section 360bbb-3(b)(1), unless the authorization is terminated or revoked sooner.     Influenza A by PCR NEGATIVE NEGATIVE Final   Influenza B by PCR NEGATIVE NEGATIVE Final    Comment: (NOTE) The Xpert Xpress SARS-CoV-2/FLU/RSV assay is intended as an aid in  the diagnosis of influenza from Nasopharyngeal swab specimens and  should not be used as a sole basis for treatment. Nasal washings and  aspirates are unacceptable for Xpert Xpress SARS-CoV-2/FLU/RSV  testing.  Fact Sheet for Patients: PinkCheek.be  Fact Sheet for Healthcare Providers: GravelBags.it  This test is not yet approved or cleared by the [place] FDA and  has been authorized for detection and/or diagnosis of SARS-CoV-2 by  FDA under an Emergency Use Authorization (EUA). This EUA will remain  in effect (meaning this test can be used) for the duration of the  Covid-19 declaration under Section 564(b)(1) of the Act, 21  U.S.C. section 360bbb-3(b)(1), unless the authorization is  terminated or revoked. Performed at [facility], [address], [place], [place] [address]   Culture, blood (routine x 2)     Status: None (Preliminary result)   Collection Time: 12/01/19 11:04 PM   Specimen: BLOOD RIGHT HAND  Result Value Ref Range Status   Specimen Description BLOOD RIGHT HAND  Final   Special Requests   Final    BOTTLES DRAWN AEROBIC AND ANAEROBIC  Blood Culture adequate volume   Culture   Final    NO GROWTH 3 DAYS Performed at [facility], [address], [place],  [place] [address]    Report Status PENDING  Incomplete  Urine culture     Status: Abnormal   Collection Time: 12/02/19  1:35 AM   Specimen: Urine, Clean Catch  Result Value Ref Range Status   Specimen Description   Final    URINE, CLEAN CATCH Performed at [facility], [address] [place], [place] [address]    Special Requests   Final    NONE Performed at [facility], [address] [place], [place] [address]    Culture (A)  Final    >=100,000 COLONIES/mL PSEUDOMONAS AERUGINOSA 10,000 COLONIES/mL YEAST    Report Status 12/04/2019 FINAL  Final   Organism ID, Bacteria PSEUDOMONAS AERUGINOSA (A)  Final      Susceptibility   Pseudomonas aeruginosa - MIC*    CEFTAZIDIME 16 INTERMEDIATE Intermediate     CIPROFLOXACIN 1 SENSITIVE Sensitive     GENTAMICIN <=1 SENSITIVE Sensitive     IMIPENEM 2 SENSITIVE Sensitive     * >=100,000 COLONIES/mL PSEUDOMONAS AERUGINOSA    Radiology Reports CT ABDOMEN PELVIS WO CONTRAST  Result Date: 11/20/2019 CLINICAL DATA:  Abdominal pain and distension. EXAM: CT ABDOMEN AND PELVIS WITHOUT CONTRAST TECHNIQUE: Multidetector CT imaging of the abdomen and pelvis was performed following the standard protocol without IV contrast. COMPARISON:  CT 2 weeks ago 11/05/2019, pelvis MRI 01/25/2019 FINDINGS: Lower chest: Improved right but slightly worsening left pleural effusion. Adjacent atelectasis. High density pericardial fluid which has slightly diminished in volume from prior exam. There are coronary artery calcifications. Motion obscures detailed assessment. Hepatobiliary: No obvious focal liver abnormality, partially obscured by motion. Motion through the gallbladder without calcified gallstone. There is no biliary dilatation. Pancreas: Parenchymal atrophy. No ductal dilatation or inflammation. Spleen: Motion limited, no gross acute  abnormality. Adrenals/Urinary Tract: Mild adrenal thickening without dominant nodule. Motion through the kidneys. There is no hydronephrosis. Mild bilateral renal parenchymal atrophy. Suggestion of mild symmetric perinephric edema, motion obscured. No visualized renal calculi. Urinary bladder is decompressed by catheter. Stomach/Bowel: Similar appearance of gaseous distension and redundancy of the sigmoid colon. Large stool burden in the ascending, transverse, and proximal descending colon. Mixed liquid and solid stool within the rectum. No obvious rectal wall thickening. Administered enteric contrast reaches the distal small bowel. Inguinal canals are patulous with small bowel approaching but not entering. No small bowel obstruction. Partially distended stomach, primarily decompressed. Vascular/Lymphatic: Aorto bi-iliac atherosclerosis. Aortic branch atherosclerosis. Small left inguinal nodes largest measuring 9 mm short axis, unchanged. Reproductive: Brachytherapy seeds in the prostate gland. Other: No free air or ascites/free fluid. Small fat containing umbilical hernia. 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 Right hip arthroplasty. There is a small left hip joint effusion. Left hip osteoarthritis. Degenerative change in the lumbar spine with primarily facet hypertrophy. Enthesopathic change involving the hamstring insertions. No acute osseous abnormalities. IMPRESSION: 1. No acute abnormality in the abdomen/pelvis. 2. Large stool burden with gaseous distension and redundancy of the sigmoid colon, similar to prior exam. Mixed liquid and solid stool within the rectum without obvious rectal wall thickening. Overall findings consistent with constipation. There is no bowel obstruction. 3. Improved right but slightly worsening small left pleural effusion. High density pericardial fluid has slightly diminished in volume from prior exam. 4. Diminished  right inguinal hernia, inguinal canal appears patulous on the current exam. No bowel involvement currently. 5. Chronic osteomyelitis of the pubic symphysis. Aortic Atherosclerosis (ICD10-I70.0). Electronically Signed   By: Aurther Loft.D.  On: 11/20/2019 23:40   DG Chest 1 View  Result Date: 12/01/2019 CLINICAL DATA:  Altered mental status EXAM: CHEST  1 VIEW COMPARISON:  11/19/2019 FINDINGS: Cardiac shadow is within normal limits. Tortuous thoracic aorta is noted. The lungs are well aerated without focal infiltrate or sizable effusion. Degenerative changes of the thoracic spine are noted. IMPRESSION: No active disease. Electronically Signed   By: Inez Catalina M.D.   On: 12/01/2019 23:53   DG Abd 1 View  Result Date: 11/08/2019 CLINICAL DATA:  Follow-up for abdominal distension EXAM: ABDOMEN - 1 VIEW COMPARISON:  None. FINDINGS: Gaseous distension of the small bowel and colon. Moderate amount of stool throughout the colon. No evidence of pneumoperitoneum, portal venous gas or pneumatosis. No pathologic calcifications along the expected course of the ureters. No acute osseous abnormality. IMPRESSION: Gaseous distension of the small bowel and colon as can be seen with an ileus. Electronically Signed   By: Kathreen Devoid   On: 11/08/2019 15:47   CT Head Wo Contrast  Result Date: 12/01/2019 CLINICAL DATA:  Altered mental status, slurred speech EXAM: CT HEAD WITHOUT CONTRAST TECHNIQUE: Contiguous axial images were obtained from the base of the skull through the vertex without intravenous contrast. COMPARISON:  MRI 08/20/2013 (images only) CT head 08/10/2013 (report only) FINDINGS: Brain: No evidence of acute infarction, hemorrhage, hydrocephalus, extra-axial collection, visible mass lesion or mass effect. Symmetric prominence of the ventricles, cisterns and sulci compatible with parenchymal volume loss. Patchy areas of white matter hypoattenuation are most compatible with chronic microvascular  angiopathy. Vascular: Atherosclerotic calcification of the carotid siphons and intradural vertebral arteries. No hyperdense vessel. Skull: No calvarial fracture or suspicious osseous lesion. No scalp swelling or hematoma. Sinuses/Orbits: Paranasal sinuses and mastoid air cells are predominantly clear. Orbital structures are unremarkable aside from prior lens extractions. Other: Debris in the right external auditory canal. IMPRESSION: 1. No acute intracranial findings. If there is persisting concern for acute infarction, MRI is more sensitive and specific for early features of ischemia. 2. Chronic microvascular angiopathy and parenchymal volume loss. 3. Debris in the right external auditory canal, correlate for cerumen impaction. Electronically Signed   By: Lovena Le M.D.   On: 12/01/2019 23:29   MR BRAIN WO CONTRAST  Result Date: 12/02/2019 CLINICAL DATA:  Altered mental status, weakness EXAM: MRI HEAD WITHOUT CONTRAST TECHNIQUE: Multiplanar, multiecho pulse sequences of the brain and surrounding structures were obtained without intravenous contrast. COMPARISON:  2015 FINDINGS: Brain: There is no acute infarction or intracranial hemorrhage. There is no intracranial mass, mass effect, or edema. There is no hydrocephalus or extra-axial fluid collection. Ventricles and sulci prominent compatible with generalized parenchymal volume loss, which has progressed since 2015. Patchy and confluent areas of T2 hyperintensity in the supratentorial greater than pontine white matter are nonspecific but probably reflect moderate chronic microvascular ischemic changes, which have also progressed. Vascular: Major vessel flow voids at the skull base are preserved. Skull and upper cervical spine: Normal marrow signal is preserved. Sinuses/Orbits: Trace mucosal thickening.  Orbits are unremarkable. Other: Sella is unremarkable.  Bilateral lens replacements. IMPRESSION: No acute infarction, hemorrhage, or mass. Progression of  chronic microvascular ischemic changes and parenchymal volume loss since 2015. Electronically Signed   By: Macy Mis M.D.   On: 12/02/2019 09:01   CT Abdomen Pelvis W Contrast  Result Date: 11/05/2019 CLINICAL DATA:  Bowel obstruction, pubic symphyseal osteomyelitis EXAM: CT ABDOMEN AND PELVIS WITH CONTRAST TECHNIQUE: Multidetector CT imaging of the abdomen and pelvis was performed using the  standard protocol following bolus administration of intravenous contrast. CONTRAST:  142mL OMNIPAQUE IOHEXOL 300 MG/ML  SOLN COMPARISON:  None. FINDINGS: Lower chest: Small bilateral pleural effusions are present with associated mild bibasilar compressive atelectasis. Small pericardial effusion is present, new since prior examination, with associated pericardial enhancement suggesting a complex/exudative effusion. Cardiac size is within normal limits. No CT evidence of cardiac tamponade. Extensive distal right coronary artery calcification. Hepatobiliary: No focal liver abnormality is seen. No gallstones, gallbladder wall thickening, or biliary dilatation. Pancreas: Moderate atrophy, but otherwise unremarkable. Spleen: Unremarkable Adrenals/Urinary Tract: The adrenal glands are unremarkable. The kidneys are normal in size and position. Tiny exophytic cortical cyst within the upper pole of the left kidney. The kidneys are otherwise unremarkable. The bladder is decompressed with a Foley catheter balloon seen within its lumen. Stomach/Bowel: Stomach and small bowel are unremarkable. The sigmoid colon is markedly redundant, however, there is no evidence of obstruction or focal inflammation. Moderate stool within the ascending and transverse colon. The appendix is not visualized and is likely absent. There is no free intraperitoneal gas or fluid. Vascular/Lymphatic: No pathologic adenopathy within the abdomen and pelvis. There is mild aortoiliac atherosclerotic calcification noted without evidence of aneurysm. Particularly  prominent calcification, however, is seen at the origin of the mesenteric and renal vasculature, though the degree of stenosis is not well assessed on this non arteriographic study. Reproductive: Brachytherapy seeds are seen within the prostate gland. Other: Small right inguinal hernia is present containing portions of a distal small bowel and the cecum as well as a small amount of mesenteric fat. Tiny fat containing left inguinal hernia is present. Musculoskeletal: Right total hip arthroplasty has been performed. Moderate left hip degenerative arthritis. Mixed lytic and sclerotic changes involving the pubic symphyses are compatible with the given history of symphyseal osteomyelitis. Calcifications seen at the a hamstring origins likely relates to remote trauma or inflammation. Degenerative changes are seen within the lumbar spine. No lytic or blastic bone lesions are seen. IMPRESSION: 1. Small pericardial effusion with associated pericardial enhancement suggesting a complex/exudative effusion. No CT evidence of cardiac tamponade. 2. Small bilateral pleural effusions. 3. Small right inguinal hernia containing portions of a distal small bowel and cecum. No evidence of bowel obstruction. 4. 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 Aortic Atherosclerosis (ICD10-I70.0). Electronically Signed   By: Fidela Salisbury MD   On: 11/05/2019 15:44   MR FOOT LEFT W WO CONTRAST  Result Date: 11/08/2019 CLINICAL DATA:  Osteomyelitis of the foot. Plain foot soft tissue wound EXAM: MRI OF THE LEFT FOREFOOT WITHOUT AND WITH CONTRAST TECHNIQUE: Multiplanar, multisequence MR imaging of the left foot was performed both before and after administration of intravenous contrast. CONTRAST:  81mL GADAVIST GADOBUTROL 1 MMOL/ML IV SOLN COMPARISON:  None. FINDINGS: TENDONS Peroneal: Peroneal longus tendon intact. Peroneal brevis intact. Posteromedial:  Posterior tibial tendon intact. Flexor hallucis longus tendon intact. Flexor digitorum longus tendon intact. Anterior: Tibialis anterior tendon intact. Extensor hallucis longus tendon intact Extensor digitorum longus tendon intact. Achilles:  Mild tendinosis of the distal Achilles tendon. Plantar Fascia: Thickening of the medial band of the plantar fascia at the calcaneal insertion as can be seen with mild plantar fasciitis. LIGAMENTS Lateral: Anterior talofibular ligament intact. Calcaneofibular ligament intact. Posterior talofibular ligament intact. Anterior and posterior tibiofibular ligaments intact. Medial: Deltoid ligament intact. Spring ligament intact. CARTILAGE Ankle Joint: No joint effusion. Normal ankle mortise. No chondral defect. Subtalar Joints/Sinus Tarsi: Normal subtalar joints.  No subtalar joint effusion. Normal sinus tarsi. Bones/Soft Tissue: Soft tissue wound along the posterolateral aspect of the hindfoot overlying the posterior calcaneus. Underlying cortical destruction of the posterolateral calcaneus just inferior to the Achilles insertion with severe surrounding marrow edema most consistent with osteomyelitis. No drainable fluid collection. Soft tissue edema throughout the hindfoot extending into the ankle concerning for cellulitis. T2 hyperintense signal throughout the plantar musculature likely neurogenic. IMPRESSION: 1. Soft tissue wound along the posterolateral aspect of the hindfoot overlying the posterior calcaneus. Underlying cortical destruction of the posterolateral calcaneus just inferior to the Achilles insertion with severe surrounding marrow edema most consistent with osteomyelitis with surrounding cellulitis. No drainable fluid collection. 2. Mild tendinosis of the distal Achilles tendon. 3. Thickening of the medial band of the plantar fascia at the calcaneal insertion as can be seen with mild plantar fasciitis. Electronically Signed   By: Kathreen Devoid   On: 11/08/2019 11:46    US Carotid Bilateral  Result Date: 12/02/2019 CLINICAL DATA:  Concern for stroke. EXAM: BILATERAL CAROTID DUPLEX ULTRASOUND TECHNIQUE: Pearline Cables scale imaging, color Doppler and duplex ultrasound were performed of bilateral carotid and vertebral arteries in the neck. COMPARISON:  None. FINDINGS: Criteria: Quantification of carotid stenosis is based on velocity parameters that correlate the residual internal carotid diameter with NASCET-based stenosis levels, using the diameter of the distal internal carotid lumen as the denominator for stenosis measurement. The following velocity measurements were obtained: RIGHT ICA: 71/12 cm/sec CCA: 38/25 cm/sec SYSTOLIC ICA/CCA RATIO:  1.0 ECA: 91 cm/sec LEFT ICA: 66/22 cm/sec CCA: 05/39 cm/sec SYSTOLIC ICA/CCA RATIO:  0.8 ECA: 69 cm/sec RIGHT CAROTID ARTERY: Echogenic plaque in the distal common carotid artery and carotid bulb. External carotid artery is patent with normal waveform. Normal waveforms and velocities in the internal carotid artery. Distal internal carotid artery is very tortuous. RIGHT VERTEBRAL ARTERY: Antegrade flow and normal waveform in the right vertebral artery. LEFT CAROTID ARTERY: Small amount of heterogeneous plaque in the proximal left common carotid artery. Small amount of plaque at the left carotid bulb. External carotid artery is patent with normal waveform. Normal waveforms and velocities in the internal carotid artery. LEFT VERTEBRAL ARTERY: Antegrade flow and normal waveform in the left vertebral artery. IMPRESSION: 1. Mild atherosclerotic disease involving the carotid arteries without significant stenosis. Estimated degree of stenosis in the internal carotid arteries is less than 50% bilaterally. 2. Patent vertebral arteries with antegrade flow. Electronically Signed   By: Markus Daft M.D.   On: 12/02/2019 13:34   DG Chest Port 1 View  Result Date: 11/19/2019 CLINICAL DATA:  Fever. EXAM: PORTABLE CHEST 1 VIEW COMPARISON:  11/05/2019 FINDINGS:  Borderline cardiomegaly. Unchanged mediastinal contours. Mild chronic elevation of left hemidiaphragm with adjacent atelectasis. Confluent consolidation. No pulmonary edema. No pneumothorax. No significant pleural effusion. Stable osseous structures with chronic degenerative change of the left shoulder. IMPRESSION: 1. No evidence of pneumonia.  Borderline cardiomegaly. 2. Chronic elevation of left hemidiaphragm with adjacent atelectasis. Electronically Signed   By: Keith Rake M.D.   On: 11/19/2019 17:53   DG Chest Port 1 View  Result Date: 11/05/2019 CLINICAL DATA:  New onset AFib EXAM: PORTABLE CHEST 1 VIEW COMPARISON:  June 09, 2019 FINDINGS: The cardiomediastinal silhouette is unchanged in contour when accounting for differences in technique.Trace fluid in the RIGHT minor fissure. No pleural effusion. No pneumothorax. No acute pleuroparenchymal abnormality. Gaseous distension of the colon. Multilevel degenerative changes of the thoracic spine. IMPRESSION: No acute cardiopulmonary abnormality. Electronically Signed   By: Colletta Maryland  Peacock MD   On: 11/05/2019 12:26   ECHOCARDIOGRAM COMPLETE  Result Date: 11/06/2019    ECHOCARDIOGRAM REPORT   Patient Name:   Ronnald D Schoppe Date of Exam: 11/06/2019 Medical Rec #:  [id number]      Height:       68.0 in Accession #:    [id number]     Weight:       170.0 lb Date of Birth:  17-Jun-1933      BSA:          1.907 m Patient Age:    78 years       BP:           103/75 mmHg Patient Gender: M              HR:           78 bpm. Exam Location:  [facility] Procedure: 2D Echo, Cardiac Doppler and Color Doppler Indications:    Prostate Cancer  History:        Patient has prior history of Echocardiogram examinations, most                 recent 01/23/2018. Risk Factors:Dyslipidemia and Diabetes.                 Prostate Cancer.  Sonographer:    Alvino Chapel RCS Referring Phys: [id number] DAWOOD S [employer]  Sonographer Comments: Technically difficult study due to poor echo  windows. IMPRESSIONS  1. Left ventricular ejection fraction, by estimation, is 60 to 65%. The left ventricle has normal function. Left ventricular endocardial border not optimally defined to evaluate regional wall motion. There is mild left ventricular hypertrophy. Left ventricular diastolic parameters are indeterminate.  2. Right ventricular systolic function is normal. The right ventricular size is normal.  3. Left atrial size was mildly dilated.  4. The pericardial effusion is circumferential.  5. The mitral valve is normal in structure. Trivial mitral valve regurgitation. No evidence of mitral stenosis.  6. The aortic valve was not well visualized. Aortic valve regurgitation is not visualized. No aortic stenosis is present. FINDINGS  Left Ventricle: Left ventricular ejection fraction, by estimation, is 60 to 65%. The left ventricle has normal function. Left ventricular endocardial border not optimally defined to evaluate regional wall motion. The left ventricular internal cavity size was normal in size. There is mild left ventricular hypertrophy. Left ventricular diastolic parameters are indeterminate. Right Ventricle: The right ventricular size is normal. No increase in right ventricular wall thickness. Right ventricular systolic function is normal. Left Atrium: Left atrial size was mildly dilated. Right Atrium: Right atrial size was normal in size. Pericardium: Trivial pericardial effusion is present. The pericardial effusion is circumferential. Mitral Valve: The mitral valve is normal in structure. Trivial mitral valve regurgitation. No evidence of mitral valve stenosis. Tricuspid Valve: The tricuspid valve is normal in structure. Tricuspid valve regurgitation is not demonstrated. No evidence of tricuspid stenosis. Aortic Valve: The aortic valve was not well visualized. Aortic valve regurgitation is not visualized. No aortic stenosis is present. Aortic valve mean gradient measures 3.3 mmHg. Aortic valve peak  gradient measures 7.2 mmHg. Aortic valve area, by VTI measures 2.80 cm. Pulmonic Valve: The pulmonic valve was not well visualized. Pulmonic valve regurgitation is not visualized. No evidence of pulmonic stenosis. Aorta: The aortic root is normal in size and structure. Pulmonary Artery: Indeterminant PASP, inadequate TR jet. Venous: The inferior vena cava was not well visualized. IAS/Shunts: The interatrial septum was not well visualized.  LEFT VENTRICLE PLAX 2D LVIDd:         4.18 cm LVIDs:         2.44 cm LV PW:         1.28 cm LV IVS:        1.12 cm LVOT diam:     2.00 cm LV SV:         56 LV SV Index:   29 LVOT Area:     3.14 cm  RIGHT VENTRICLE RV S prime:     13.00 cm/s TAPSE (M-mode): 1.4 cm LEFT ATRIUM             Index       RIGHT ATRIUM           Index LA diam:        2.60 cm 1.36 cm/m  RA Area:     15.90 cm LA Vol (A2C):   57.8 ml 30.30 ml/m RA Volume:   38.20 ml  20.03 ml/m LA Vol (A4C):   58.6 ml 30.72 ml/m LA Biplane Vol: 59.0 ml 30.93 ml/m  AORTIC VALVE AV Area (Vmax):    2.36 cm AV Area (Vmean):   2.29 cm AV Area (VTI):     2.80 cm AV Vmax:           134.16 cm/s AV Vmean:          83.194 cm/s AV VTI:            0.199 m AV Peak Grad:      7.2 mmHg AV Mean Grad:      3.3 mmHg LVOT Vmax:         100.60 cm/s LVOT Vmean:        60.700 cm/s LVOT VTI:          0.178 m LVOT/AV VTI ratio: 0.89  AORTA Ao Root diam: 3.00 cm MITRAL VALVE                TRICUSPID VALVE MV Area (PHT): 3.61 cm     TR Peak grad:   22.3 mmHg MV Decel Time: 210 msec     TR Vmax:        236.00 cm/s MV E velocity: 123.00 cm/s                             SHUNTS                             Systemic VTI:  0.18 m                             Systemic Diam: 2.00 cm Carlyle Dolly MD Electronically signed by Carlyle Dolly MD Signature Date/Time: 11/06/2019/11:30:42 AM    Final    ECHOCARDIOGRAM LIMITED  Result Date: 12/02/2019    ECHOCARDIOGRAM LIMITED REPORT   Patient Name:   Abel D Daoust Date of Exam: 12/02/2019 Medical  Rec #:  [id number]      Height:       68.0 in Accession #:    [id number]     Weight:       145.3 lb Date of Birth:  Aug 05, 1933      BSA:          1.784 m Patient Age:    44 years       BP:  138/77 mmHg Patient Gender: M              HR:           88 bpm. Exam Location:  [facility] Procedure: Limited Echo Indications:    Stroke 434.91 / I163.9  History:        Patient has prior history of Echocardiogram examinations, most                 recent 11/06/2019. Arrythmias:Atrial Flutter and Atrial                 Fibrillation; Risk Factors:Dyslipidemia, Diabetes and                 Non-Smoker. Prostate cancer.  Sonographer:    Leavy Cella RDCS (AE) Referring Phys: [id number] OLADAPO ADEFESO IMPRESSIONS  1. Left ventricular ejection fraction, by estimation, is 55 to 60%. The left ventricle has normal function. Left ventricular endocardial border not optimally defined to evaluate regional wall motion. There is moderate left ventricular hypertrophy. Left ventricular diastolic parameters are indeterminate.  2. Right ventricular systolic function is normal. The right ventricular size is normal. Tricuspid regurgitation signal is inadequate for assessing PA pressure.  3. The mitral valve is grossly normal. Trivial mitral valve regurgitation. Moderate mitral annular calcification.  4. The aortic valve is tricuspid. Aortic valve regurgitation is not visualized.  5. Unable to estimate CVP. FINDINGS  Left Ventricle: Left ventricular ejection fraction, by estimation, is 55 to 60%. The left ventricle has normal function. Left ventricular endocardial border not optimally defined to evaluate regional wall motion. The left ventricular internal cavity size was normal in size. There is moderate left ventricular hypertrophy. Left ventricular diastolic parameters are indeterminate. Right Ventricle: The right ventricular size is normal. No increase in right ventricular wall thickness. Right ventricular systolic function is normal.  Tricuspid regurgitation signal is inadequate for assessing PA pressure. Pericardium: There is no evidence of pericardial effusion. Presence of pericardial fat pad. Mitral Valve: The mitral valve is grossly normal. Moderate mitral annular calcification. Trivial mitral valve regurgitation. Tricuspid Valve: The tricuspid valve is not well visualized. Tricuspid valve regurgitation is trivial. Aortic Valve: The aortic valve is tricuspid. There is mild aortic valve annular calcification. Aortic valve regurgitation is not visualized. Pulmonic Valve: The pulmonic valve was not well visualized. Pulmonic valve regurgitation is trivial. Aorta: The aortic root is normal in size and structure. Venous: Unable to estimate CVP. The inferior vena cava was not well visualized. IAS/Shunts: No atrial level shunt detected by color flow Doppler. LEFT VENTRICLE PLAX 2D LVIDd:         2.90 cm LVIDs:         2.27 cm LV PW:         1.45 cm LV IVS:        1.25 cm LVOT diam:     2.10 cm LVOT Area:     3.46 cm  LEFT ATRIUM         Index LA diam:    3.50 cm 1.96 cm/m   AORTA Ao Root diam: 3.00 cm MITRAL VALVE MV Area (PHT): 3.85 cm     SHUNTS MV Decel Time: 197 msec     Systemic Diam: 2.10 cm MV E velocity: 56.60 cm/s MV A velocity: 101.00 cm/s MV E/A ratio:  0.56 Rozann Lesches MD Electronically signed by Rozann Lesches MD Signature Date/Time: 12/02/2019/11:17:33 AM    Final      Time Spent in minutes  30  Desiree Hane M.D on 12/04/2019 at 4:24 PM  To page go to [email or web address] - password [facility]

## 2019-12-04 NOTE — Progress Notes (Signed)
Physical Therapy Treatment Patient Details Name: Anthony Dunlap MRN: [id number] DOB: Jan 21, 1934 Today's Date: 12/04/2019    History of Present Illness BERTHOLD GLACE is a 86 y.o. 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  Patient was unable to provide details of why he came to the hospital, he kept saying " I felt sick" without being able to expantiate on this. Per report, patient was reported to have slurred speech, decreased appetite and not acting himself, patient was also said to have left-sided weakness.  He has chronic indwelling catheter and was recently admitted and discharged from [facility] from 9/16-10/7 due to acute osteomyelitis of left calcaneus status post left BKA.    PT Comments    Patient requires assist for all mobility today. Patient requires mod assist and HOB elevated to transition to sitting with use of bed rails secondary to impaired core strength. Patient shows good sitting tolerance today and improving sitting balance but tends to reach for bed rails for sitting support with fatigue. Patient frequently requires cueing to reduce UE support for working on static and dynamic sitting balance. Patient educated on proper scooting mechanics through verbal, tactile cueing and demonstration with poor carry over. Patient attempting to use residual limbs to push himself despite cueing to use hands to lift and scoot. Patient will benefit from continued physical therapy in hospital and recommended venue below to increase strength, balance, endurance for safe ADLs and gait.   Follow Up Recommendations  SNF;Supervision for mobility/OOB;Supervision - Intermittent     Equipment Recommendations  None recommended by PT    Recommendations for Other Services        Precautions / Restrictions Precautions Precautions: Fall Precaution Comments: R BKA, 9/22 L BKA Restrictions Weight Bearing Restrictions: Yes RLE Weight Bearing: Non weight bearing LLE Weight Bearing: Non weight bearing Other Position/Activity Restrictions: Patient pushing down through residual limbs despite cueing for precautions    Mobility  Bed Mobility Overal bed mobility: Needs Assistance Bed Mobility: Supine to Sit;Sit to Supine Rolling: Mod assist   Supine to sit: Mod assist;HOB elevated Sit to supine: Min assist   General bed mobility comments: Increased time, use of bedrail due to poor core strength to power up into sitting  Transfers Overall transfer level: Needs assistance Equipment used: 1 person hand held assist Transfers: Lateral/Scoot Transfers       Anterior-Posterior transfers: Max assist  Lateral/Scoot Transfers: Max assist General transfer comment: Patient unable to scoot despite cueing, patient attempting to use residual limbs to push himself despite cueing to use hands to lift and scoot  Ambulation/Gait                 Stairs             Wheelchair Mobility    Modified Rankin (Stroke Patients Only)       Balance Overall balance assessment: Needs assistance Sitting-balance support: Bilateral upper extremity supported Sitting balance-Leahy Scale: Fair Sitting balance - Comments: fair/poor with BUE support, reaches for bed rails frequently for support Postural control: Posterior lean                                  Cognition Arousal/Alertness: Awake/alert Behavior During Therapy: WFL for tasks assessed/performed Overall Cognitive Status:  Within Functional Limits for tasks assessed                                        Exercises General Exercises - Upper Extremity Shoulder Flexion: AROM;10 reps Shoulder Extension: AROM;10 reps Elbow Flexion: AROM;10 reps Elbow Extension: AROM;10 reps Other  Exercises Other Exercises: shoulder protraction, A/ROM, 10X Other Exercises: bed pull ups (mimic sit-up), 5x, then 2x with 10" holds    General Comments        Pertinent Vitals/Pain Pain Assessment: No/denies pain    Home Living                      Prior Function            PT Goals (current goals can now be found in the care plan section) Acute Rehab PT Goals Patient Stated Goal: return home with family to assist PT Goal Formulation: With patient Time For Goal Achievement: 12/16/19 Potential to Achieve Goals: Fair Progress towards PT goals: Progressing toward goals    Frequency    Min 3X/week      PT Plan Current plan remains appropriate    Co-evaluation              AM-PAC PT "6 Clicks" Mobility   Outcome Measure  Help needed turning from your back to your side while in a flat bed without using bedrails?: A Little Help needed moving from lying on your back to sitting on the side of a flat bed without using bedrails?: A Lot Help needed moving to and from a bed to a chair (including a wheelchair)?: Total Help needed standing up from a chair using your arms (e.g., wheelchair or bedside chair)?: Total Help needed to walk in hospital room?: Total Help needed climbing 3-5 steps with a railing? : Total 6 Click Score: 9    End of Session   Activity Tolerance: Patient tolerated treatment well;Patient limited by fatigue Patient left: in bed;with call bell/phone within reach;with bed alarm set Nurse Communication: Mobility status PT Visit Diagnosis: Other abnormalities of gait and mobility (R26.89);Muscle weakness (generalized) (M62.81);Difficulty in walking, not elsewhere classified (R26.2)     Time: 1010-1025 PT Time Calculation (min) (ACUTE ONLY): 15 min  Charges:  $Therapeutic Activity: 8-22 mins                     11:08 AM, 12/04/19 Mearl Latin PT, DPT Physical Therapist at [facility]

## 2019-12-04 NOTE — Progress Notes (Signed)
Occupational Therapy Treatment Patient Details Name: Anthony Dunlap MRN: [id number] DOB: 1933-12-28 Today's Date: 12/04/2019    History of present illness Anthony Dunlap is a 86 y.o. 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  Patient was unable to provide details of why he came to the hospital, he kept saying " I felt sick" without being able to expantiate on this. Per report, patient was reported to have slurred speech, decreased appetite and not acting himself, patient was also said to have left-sided weakness.  He has chronic indwelling catheter and was recently admitted and discharged from [facility] from 9/16-10/7 due to acute osteomyelitis of left calcaneus status post left BKA.   OT comments  Pt agreeable to OT treatment this am. Pt lying sideways in bed on OT arrival, OT assisting with repositioning. With pt attempts at a lateral scoot pt with posterior lean pushing his body forward versus sideways, therefore max assist for successfully scooting. Once lying supine pt able to reach back and use bedrail to pull himself up in the bed. Pt performing BUE exercises, verbalizing understanding of importance of BUE strength needed to compensate for inability to stand. OT notes decreased core strength during seated activities at EOB. Discharge plan remains appropriate. Note-pt family is requesting [place] services instead of SNF, will need [place] OT if decide not to go to SNF.    Follow Up Recommendations  SNF    Equipment Recommendations  None recommended by OT       Precautions / Restrictions Precautions Precautions: Fall Restrictions Weight Bearing Restrictions: Yes RLE Weight Bearing: Non weight bearing LLE Weight Bearing: Non weight bearing       Mobility Bed Mobility Overal bed  mobility: Needs Assistance Bed Mobility: Supine to Sit;Sit to Supine     Supine to sit: Mod assist;HOB elevated Sit to supine: Min assist   General bed mobility comments: Increased time, use of bedrail due to poor core strength to power up into sitting  Transfers Overall transfer level: Needs assistance Equipment used: 1 person hand held assist Transfers: Lateral/Scoot Transfers       Anterior-Posterior transfers: Max assist  Lateral/Scoot Transfers: Max assist General transfer comment: Pt unable to lift up on BUE to perform scoot, when attempting has posterior lean and scoots forward instead of laterally.     Balance Overall balance assessment: Needs assistance Sitting-balance support: Bilateral upper extremity supported Sitting balance-Leahy Scale: Fair Sitting balance - Comments: fair/poor with BUE support Postural control: Posterior lean                                 ADL either performed or assessed with clinical judgement        Vision   Vision Assessment?: No apparent visual deficits          Cognition Arousal/Alertness: Awake/alert Behavior During Therapy: WFL for tasks assessed/performed Overall Cognitive Status: Within Functional Limits for tasks assessed                                          Exercises Exercises: General Upper Extremity;Other exercises General Exercises - Upper Extremity Shoulder Flexion: AROM;10 reps Shoulder Extension: AROM;10  reps Elbow Flexion: AROM;10 reps Elbow Extension: AROM;10 reps Other Exercises Other Exercises: shoulder protraction, A/ROM, 10X Other Exercises: bed pull ups (mimic sit-up), 5x, then 2x with 10" holds           Pertinent Vitals/ Pain       Pain Assessment: No/denies pain     Prior Functioning/Environment              Frequency  Min 1X/week        Progress Toward Goals  OT Goals(current goals can now be found in the care plan section)  Progress towards  OT goals: Progressing toward goals  Acute Rehab OT Goals Patient Stated Goal: return home with family to assist OT Goal Formulation: With patient Time For Goal Achievement: 12/16/19 ADL Goals Pt Will Perform Eating: with set-up;sitting Pt Will Perform Grooming: with set-up;sitting Pt Will Perform Upper Body Dressing: with supervision;sitting Pt/caregiver will Perform Home Exercise Program: Increased strength;Both right and left upper extremity;With Supervision;With written HEP provided  Plan Discharge plan remains appropriate          End of Session    OT Visit Diagnosis: Muscle weakness (generalized) (M62.81);Other symptoms and signs involving cognitive function   Activity Tolerance Patient tolerated treatment well   Patient Left in bed;with call bell/phone within reach;with bed alarm set   Nurse Communication          Time: 2585-2778 OT Time Calculation (min): 20 min  Charges: OT General Charges $OT Visit: 1 Visit OT Treatments $Therapeutic Exercise: 8-22 mins   Guadelupe Sabin, OTR/L  [phone] 12/04/2019, 8:11 AM

## 2019-12-04 NOTE — Care Management Important Message (Signed)
Important Message  Patient Details  Name: Anthony Dunlap MRN: [id number] Date of Birth: 1933-08-27   Medicare Important Message Given:  Yes     Tommy Medal 12/04/2019, 4:28 PM

## 2019-12-05 DIAGNOSIS — I739 Peripheral vascular disease, unspecified: Secondary | ICD-10-CM

## 2019-12-05 DIAGNOSIS — Z89512 Acquired absence of left leg below knee: Secondary | ICD-10-CM

## 2019-12-05 DIAGNOSIS — R339 Retention of urine, unspecified: Secondary | ICD-10-CM

## 2019-12-05 LAB — GLUCOSE, CAPILLARY
Glucose-Capillary: 303 mg/dL — ABNORMAL HIGH (ref 70–99)
Glucose-Capillary: 288 mg/dL — ABNORMAL HIGH (ref 70–99)
Glucose-Capillary: 313 mg/dL — ABNORMAL HIGH (ref 70–99)
Glucose-Capillary: 258 mg/dL — ABNORMAL HIGH (ref 70–99)

## 2019-12-05 LAB — CBC
HCT: 29.2 % — ABNORMAL LOW (ref 39.0–52.0)
Hemoglobin: 8.9 g/dL — ABNORMAL LOW (ref 13.0–17.0)
MCH: 25.2 pg — ABNORMAL LOW (ref 26.0–34.0)
MCHC: 30.5 g/dL (ref 30.0–36.0)
MCV: 82.7 fL (ref 80.0–100.0)
Platelets: 484 10*3/uL — ABNORMAL HIGH (ref 150–400)
RBC: 3.53 MIL/uL — ABNORMAL LOW (ref 4.22–5.81)
RDW: 17 % — ABNORMAL HIGH (ref 11.5–15.5)
WBC: 12.2 10*3/uL — ABNORMAL HIGH (ref 4.0–10.5)
nRBC: 0 % (ref 0.0–0.2)

## 2019-12-05 LAB — COMPREHENSIVE METABOLIC PANEL
ALT: 12 U/L (ref 0–44)
AST: 15 U/L (ref 15–41)
Albumin: 2.7 g/dL — ABNORMAL LOW (ref 3.5–5.0)
Alkaline Phosphatase: 150 U/L — ABNORMAL HIGH (ref 38–126)
Anion gap: 11 (ref 5–15)
BUN: 27 mg/dL — ABNORMAL HIGH (ref 8–23)
CO2: 21 mmol/L — ABNORMAL LOW (ref 22–32)
Calcium: 9 mg/dL (ref 8.9–10.3)
Chloride: 102 mmol/L (ref 98–111)
Creatinine, Ser: 1.09 mg/dL (ref 0.61–1.24)
GFR, Estimated: >60 mL/min (ref >60)
Glucose, Bld: 327 mg/dL — ABNORMAL HIGH (ref 70–99)
Potassium: 3.5 mmol/L (ref 3.5–5.1)
Sodium: 134 mmol/L — ABNORMAL LOW (ref 135–145)
Total Bilirubin: 0.6 mg/dL (ref 0.3–1.2)
Total Protein: 7.2 g/dL (ref 6.5–8.1)

## 2019-12-05 MED ORDER — INSULIN GLARGINE 100 UNIT/ML ~~LOC~~ SOLN
12.0000 [IU] | Freq: Every day | SUBCUTANEOUS | Status: DC
  Administered 2019-12-06: 12 [IU] via SUBCUTANEOUS
  Filled 2019-12-05 (×2): qty 0.12

## 2019-12-05 NOTE — Assessment & Plan Note (Signed)
-  Continue wound care and frequent turning

## 2019-12-05 NOTE — Assessment & Plan Note (Signed)
-   see sepsis 

## 2019-12-05 NOTE — Assessment & Plan Note (Signed)
-   Foley usually exchanged monthly, last documented exchange on 9/22 (over wire via cystoscopy by urology given concern for retraction of Foley into the prostate) prior to admission.   - Status post exchange on 10/13 (#16) given urethral stricture as recommended by urology over the phone -Continue outpatient care with urology at discharge

## 2019-12-05 NOTE — Hospital Course (Signed)
Anthony Dunlap is a 86 y.o. 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 fib who presented on 10/12 with altered mental status and concern for new left-sided weakness and was found to have leukocytosis, thrombocytosis, tachypnea, hyperglycemia, creatinine of 1.27 concerning for sepsis secondary to presumed UTI given abnormal UA in the setting of chronic indwelling Foley.  Patient was started on IV ceftriaxone.  Telemetry neurology was also consulted due to concern for stroke work-up.   MRI brain was negative for any acute intracranial pathology.  Discussed case with urology who recommended simple catheter exchange at bedside given concern for UTI.   Blood cultures remained negative from admission.  Urine culture speciated to Pseudomonas and he was treated with ciprofloxacin.  Course will complete on 12/08/2019. His mentation improved with treatment of his infection.  He was also evaluated by physical therapy with recommendations for SNF at discharge however wished for home health PT instead.  Family aware of decisions and in agreement.

## 2019-12-05 NOTE — Progress Notes (Signed)
PROGRESS NOTE    Anthony Dunlap   ELF:[id number]  DOB: 1933-06-26  DOA: 12/01/2019     3  PCP: [name], MD  CC: AMS  Hospital Course: Anthony Dunlap is a 86 y.o. 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 fib who presented on 10/12 with altered mental status and concern for new left-sided weakness and was found to have leukocytosis, thrombocytosis, tachypnea, hyperglycemia, creatinine of 1.27 concerning for sepsis secondary to presumed UTI given abnormal UA in the setting of chronic indwelling Foley.  Patient was started on IV ceftriaxone.  Telemetry neurology was also consulted due to concern for stroke work-up.   MRI brain was negative for any acute intracranial pathology.  Discussed case with urology who recommended simple catheter exchange at bedside given concern for UTI.   Blood cultures remained negative from admission.  Urine culture speciated to Pseudomonas and he was treated with ciprofloxacin.  Course will complete on 12/08/2019. His mentation improved with treatment of his infection.  He was also evaluated by physical therapy with recommendations for SNF at discharge however wished for home health PT instead.  Family aware of decisions and in agreement.   Interval History:  No events overnight.  Resting in bed comfortably this morning when seen.  He is alert and oriented and conversing well.  Spoke with daughter on the phone, [name].  Tentative plan is for discharge home tomorrow at 12 PM.  Patient also aware and comfortable with plan.  Old records reviewed in assessment of this patient  ROS: Constitutional: negative for chills and fevers, Respiratory: negative for cough, Cardiovascular: negative for chest pain and Gastrointestinal: negative for abdominal pain  Assessment & Plan: * Sepsis secondary to UTI ([name])-resolved as of 12/05/2019 Presented with tachypnea,  leukocytosis positive UA with chronic indwelling Foley and changes in mentation -Foley catheter changed on admission -Patient responding well to ciprofloxacin.  7-day course anticipated, now D4/7 -Blood cultures remain negative -Spoke with daughter, [name] and we will plan for discharge home on Sunday around 12 PM  Hx of BKA, left ([place]) -Recent BKA on 11/11/2019 for treatment of left calcaneal osteomyelitis -Stump healing well with some scabbing noted over incision -Staples are okay to be removed today, 12/05/2019 -Still needs to follow-up outpatient with orthopedic surgery  Urinary retention - Foley usually exchanged monthly, last documented exchange on 9/22 (over wire via cystoscopy by urology given concern for retraction of Foley into the prostate) prior to admission.   - Status post exchange on 10/13 (#16) given urethral stricture as recommended by urology over the phone -Continue outpatient care with urology at discharge  Pseudomonas urinary tract infection - see sepsis  Atrial fibrillation with RVR ([place]) -Also considered in setting of infection -Rate is now controlled -Continue Eliquis, amiodarone, Toprol, Cardizem -In the future if recurrent falls or any bradycardia, will need to de-escalate regimen some  PAD (peripheral artery disease) ([place])  Status post bilateral BKA. -Continue Plavix and statin  Decubitus ulcer of sacral region, stage 2 ([place]) -Continue wound care and frequent turning  Hypothyroidism -Continue Synthroid  Type 2 diabetes mellitus ([place]) - continue SSI and CBG monitoring - continue Lantus; still hyperglycemic, dose increased to 12 units daily - may still need outpatient adjustment  - A1c 8.5% on 11/05/19  AKI (acute kidney injury) ([name])-resolved as of 12/05/2019 -Resolved with fluids  Acute metabolic encephalopathy-resolved as of 12/05/2019 -Consider due to UTI.  Mentation improved with  treatment of infection    Antimicrobials: Cipro  DVT  prophylaxis: Eliquis Code Status: Full Family Communication: daughter via phone Disposition Plan: Status is: Inpatient  Remains inpatient appropriate because:Unsafe d/c plan and Inpatient level of care appropriate due to severity of illness   Dispo: The patient is from: Home              Anticipated d/c is to: Home              Anticipated d/c date is: 1 day              Patient currently is not medically stable to d/c.       Objective: Blood pressure 109/69, pulse 84, temperature 98.7 F (37.1 C), resp. rate 17, height 5\' 8"  (1.727 m), weight 65.9 kg, SpO2 98 %.  Examination: General appearance: alert, cooperative and no distress Head: Normocephalic, without obvious abnormality, atraumatic Eyes: EOMI Lungs: clear to auscultation bilaterally Heart: regular rate and rhythm and S1, S2 normal Abdomen: normal findings: bowel sounds normal and soft, non-tender Extremities: b/l BKA noted; LLE staples in place with scabs over both stump sutes  Skin: mobility and turgor normal Neurologic: Grossly normal  Consultants:   none  Procedures:   none  Data Reviewed: I have personally reviewed following labs and imaging studies Results for orders placed or performed during the hospital encounter of 12/01/19 (from the past 24 hour(s))  Glucose, capillary     Status: Abnormal   Collection Time: 12/04/19  4:55 PM  Result Value Ref Range   Glucose-Capillary 315 (H) 70 - 99 mg/dL  Glucose, capillary     Status: Abnormal   Collection Time: 12/04/19  8:42 PM  Result Value Ref Range   Glucose-Capillary 275 (H) 70 - 99 mg/dL  Glucose, capillary     Status: Abnormal   Collection Time: 12/05/19  7:20 AM  Result Value Ref Range   Glucose-Capillary 303 (H) 70 - 99 mg/dL  CBC     Status: Abnormal   Collection Time: 12/05/19 10:41 AM  Result Value Ref Range   WBC 12.2 (H) 4.0 - 10.5 K/uL   RBC 3.53 (L) 4.22 - 5.81 MIL/uL   Hemoglobin 8.9 (L) 13.0 - 17.0 g/dL   HCT 29.2 (L) 39 - 52 %    MCV 82.7 80.0 - 100.0 fL   MCH 25.2 (L) 26.0 - 34.0 pg   MCHC 30.5 30.0 - 36.0 g/dL   RDW 17.0 (H) 11.5 - 15.5 %   Platelets 484 (H) 150 - 400 K/uL   nRBC 0.0 0.0 - 0.2 %  Comprehensive metabolic panel     Status: Abnormal   Collection Time: 12/05/19 10:41 AM  Result Value Ref Range   Sodium 134 (L) 135 - 145 mmol/L   Potassium 3.5 3.5 - 5.1 mmol/L   Chloride 102 98 - 111 mmol/L   CO2 21 (L) 22 - 32 mmol/L   Glucose, Bld 327 (H) 70 - 99 mg/dL   BUN 27 (H) 8 - 23 mg/dL   Creatinine, Ser 1.09 0.61 - 1.24 mg/dL   Calcium 9.0 8.9 - 10.3 mg/dL   Total Protein 7.2 6.5 - 8.1 g/dL   Albumin 2.7 (L) 3.5 - 5.0 g/dL   AST 15 15 - 41 U/L   ALT 12 0 - 44 U/L   Alkaline Phosphatase 150 (H) 38 - 126 U/L   Total Bilirubin 0.6 0.3 - 1.2 mg/dL   GFR, Estimated >60 >60 mL/min   Anion gap  11 5 - 15  Glucose, capillary     Status: Abnormal   Collection Time: 12/05/19 11:30 AM  Result Value Ref Range   Glucose-Capillary 288 (H) 70 - 99 mg/dL    Recent Results (from the past 240 hour(s))  Culture, blood (routine x 2)     Status: None (Preliminary result)   Collection Time: 12/01/19 10:51 PM   Specimen: BLOOD RIGHT HAND  Result Value Ref Range Status   Specimen Description BLOOD RIGHT HAND  Final   Special Requests   Final    BOTTLES DRAWN AEROBIC AND ANAEROBIC Blood Culture adequate volume   Culture   Final    NO GROWTH 4 DAYS Performed at [facility], [address] [place], [place] [address]    Report Status PENDING  Incomplete  Respiratory Panel by RT PCR (Flu A&B, Covid) - Nasopharyngeal Swab     Status: None   Collection Time: 12/01/19 11:00 PM   Specimen: Nasopharyngeal Swab  Result Value Ref Range Status   SARS Coronavirus 2 by RT PCR NEGATIVE NEGATIVE Final    Comment: (NOTE) SARS-CoV-2 target nucleic acids are NOT DETECTED.  The SARS-CoV-2 RNA is generally detectable in upper respiratoy specimens during the acute phase of infection. The lowest concentration of SARS-CoV-2 viral  copies this assay can detect is 131 copies/mL. A negative result does not preclude SARS-Cov-2 infection and should not be used as the sole basis for treatment or other patient management decisions. A negative result may occur with  improper specimen collection/handling, submission of specimen other than nasopharyngeal swab, presence of viral mutation(s) within the areas targeted by this assay, and inadequate number of viral copies (<131 copies/mL). A negative result must be combined with clinical observations, patient history, and epidemiological information. The expected result is Negative.  Fact Sheet for Patients:  PinkCheek.be  Fact Sheet for Healthcare Providers:  GravelBags.it  This test is no t yet approved or cleared by the [place] FDA and  has been authorized for detection and/or diagnosis of SARS-CoV-2 by FDA under an Emergency Use Authorization (EUA). This EUA will remain  in effect (meaning this test can be used) for the duration of the COVID-19 declaration under Section 564(b)(1) of the Act, 21 U.S.C. section 360bbb-3(b)(1), unless the authorization is terminated or revoked sooner.     Influenza A by PCR NEGATIVE NEGATIVE Final   Influenza B by PCR NEGATIVE NEGATIVE Final    Comment: (NOTE) The Xpert Xpress SARS-CoV-2/FLU/RSV assay is intended as an aid in  the diagnosis of influenza from Nasopharyngeal swab specimens and  should not be used as a sole basis for treatment. Nasal washings and  aspirates are unacceptable for Xpert Xpress SARS-CoV-2/FLU/RSV  testing.  Fact Sheet for Patients: PinkCheek.be  Fact Sheet for Healthcare Providers: GravelBags.it  This test is not yet approved or cleared by the [place] FDA and  has been authorized for detection and/or diagnosis of SARS-CoV-2 by  FDA under an Emergency Use Authorization (EUA). This  EUA will remain  in effect (meaning this test can be used) for the duration of the  Covid-19 declaration under Section 564(b)(1) of the Act, 21  U.S.C. section 360bbb-3(b)(1), unless the authorization is  terminated or revoked. Performed at [facility], [address] [place], [place] [address]   Culture, blood (routine x 2)     Status: None (Preliminary result)   Collection Time: 12/01/19 11:04 PM   Specimen: BLOOD RIGHT HAND  Result Value Ref Range Status   Specimen  Description BLOOD RIGHT HAND  Final   Special Requests   Final    BOTTLES DRAWN AEROBIC AND ANAEROBIC Blood Culture adequate volume   Culture   Final    NO GROWTH 4 DAYS Performed at [facility], [address] [place], [place] [address]    Report Status PENDING  Incomplete  Urine culture     Status: Abnormal   Collection Time: 12/02/19  1:35 AM   Specimen: Urine, Clean Catch  Result Value Ref Range Status   Specimen Description   Final    URINE, CLEAN CATCH Performed at [facility], [address], [place], [place] [address]    Special Requests   Final    NONE Performed at [facility], [address], [place], [place] [address]    Culture (A)  Final    >=100,000 COLONIES/mL PSEUDOMONAS AERUGINOSA 10,000 COLONIES/mL YEAST    Report Status 12/04/2019 FINAL  Final   Organism ID, Bacteria PSEUDOMONAS AERUGINOSA (A)  Final      Susceptibility   Pseudomonas aeruginosa - MIC*    CEFTAZIDIME 16 INTERMEDIATE Intermediate     CIPROFLOXACIN 1 SENSITIVE Sensitive     GENTAMICIN <=1 SENSITIVE Sensitive     IMIPENEM 2 SENSITIVE Sensitive     * >=100,000 COLONIES/mL PSEUDOMONAS AERUGINOSA     Radiology Studies: No results found. US Carotid Bilateral  Final Result    MR BRAIN WO CONTRAST  Final Result    DG Chest 1 View  Final Result    CT Head Wo Contrast  Final Result      Scheduled Meds: . amiodarone  200 mg Oral BID  . amitriptyline  20 mg Oral QHS  . apixaban  5 mg Oral BID  . aspirin EC  81 mg  Oral Daily  . Chlorhexidine Gluconate Cloth  6 each Topical Daily  . ciprofloxacin  500 mg Oral BID  . clopidogrel  75 mg Oral Daily  . diltiazem  300 mg Oral Daily  . feeding supplement (GLUCERNA SHAKE)  237 mL Oral TID BM  . ferrous sulfate  325 mg Oral BID WC  . insulin aspart  0-15 Units Subcutaneous TID WC  . insulin aspart  0-5 Units Subcutaneous QHS  . [START ON 12/06/2019] insulin glargine  12 Units Subcutaneous Daily  . levothyroxine  100 mcg Oral Q breakfast  . metoprolol succinate  100 mg Oral Daily  . multivitamin with minerals  1 tablet Oral Daily  . nutrition supplement (JUVEN)  1 packet Oral BID BM  . nystatin   Topical TID  . pantoprazole  40 mg Oral Daily  . pravastatin  10 mg Oral Q supper  . tobramycin  1 drop Both Eyes Q4H   PRN Meds: acetaminophen **OR** acetaminophen, colchicine, senna-docusate Continuous Infusions:    LOS: 3 days  Time spent: Greater than 50% of the 35 minute visit was spent in counseling/coordination of care for the patient as laid out in the A&P.   Dwyane Dee, MD [facility] 12/05/2019, 2:16 PM

## 2019-12-05 NOTE — Assessment & Plan Note (Signed)
-  Consider due to UTI.  Mentation improved with treatment of infection

## 2019-12-05 NOTE — Assessment & Plan Note (Signed)
Continue Synthroid °

## 2019-12-05 NOTE — Assessment & Plan Note (Addendum)
-   continue SSI and CBG monitoring - continue Lantus; still hyperglycemic, dose increased to 12 units daily - may still need outpatient adjustment  - A1c 8.5% on 11/05/19

## 2019-12-05 NOTE — Assessment & Plan Note (Signed)
Presented with tachypnea, leukocytosis positive UA with chronic indwelling Foley and changes in mentation -Foley catheter changed on admission -Patient responding well to ciprofloxacin.  7-day course anticipated, now D4/7 -Blood cultures remain negative -Spoke with daughter, [name] and we will plan for discharge home on Sunday around 12 PM

## 2019-12-05 NOTE — Assessment & Plan Note (Addendum)
-  Recent BKA on 11/11/2019 for treatment of left calcaneal osteomyelitis -Stump healing well with some scabbing noted over incision -Staples removed 12/05/2019 -Still needs to follow-up outpatient with orthopedic surgery

## 2019-12-05 NOTE — Assessment & Plan Note (Signed)
Status post bilateral BKA. -Continue Plavix and statin

## 2019-12-05 NOTE — Assessment & Plan Note (Signed)
Resolved with fluids °

## 2019-12-05 NOTE — Assessment & Plan Note (Addendum)
-  Also considered in setting of infection -Rate is now controlled -Continue Eliquis, amiodarone, Toprol, Cardizem -In the future if recurrent falls or any bradycardia, will need to de-escalate regimen some

## 2019-12-06 LAB — CBC WITH DIFFERENTIAL/PLATELET
Abs Immature Granulocytes: 0.08 10*3/uL — ABNORMAL HIGH (ref 0.00–0.07)
Basophils Absolute: 0 10*3/uL (ref 0.0–0.1)
Basophils Relative: 0 %
Eosinophils Absolute: 0.2 10*3/uL (ref 0.0–0.5)
Eosinophils Relative: 2 %
HCT: 28.2 % — ABNORMAL LOW (ref 39.0–52.0)
Hemoglobin: 8.7 g/dL — ABNORMAL LOW (ref 13.0–17.0)
Immature Granulocytes: 1 %
Lymphocytes Relative: 16 %
Lymphs Abs: 1.7 10*3/uL (ref 0.7–4.0)
MCH: 24.9 pg — ABNORMAL LOW (ref 26.0–34.0)
MCHC: 30.9 g/dL (ref 30.0–36.0)
MCV: 80.8 fL (ref 80.0–100.0)
Monocytes Absolute: 0.8 10*3/uL (ref 0.1–1.0)
Monocytes Relative: 7 %
Neutro Abs: 7.8 10*3/uL — ABNORMAL HIGH (ref 1.7–7.7)
Neutrophils Relative %: 74 %
Platelets: 445 10*3/uL — ABNORMAL HIGH (ref 150–400)
RBC: 3.49 MIL/uL — ABNORMAL LOW (ref 4.22–5.81)
RDW: 16.8 % — ABNORMAL HIGH (ref 11.5–15.5)
WBC: 10.7 10*3/uL — ABNORMAL HIGH (ref 4.0–10.5)
nRBC: 0 % (ref 0.0–0.2)

## 2019-12-06 LAB — BASIC METABOLIC PANEL
Anion gap: 8 (ref 5–15)
BUN: 31 mg/dL — ABNORMAL HIGH (ref 8–23)
CO2: 21 mmol/L — ABNORMAL LOW (ref 22–32)
Calcium: 9 mg/dL (ref 8.9–10.3)
Chloride: 103 mmol/L (ref 98–111)
Creatinine, Ser: 0.9 mg/dL (ref 0.61–1.24)
GFR, Estimated: >60 mL/min (ref >60)
Glucose, Bld: 282 mg/dL — ABNORMAL HIGH (ref 70–99)
Potassium: 3.6 mmol/L (ref 3.5–5.1)
Sodium: 132 mmol/L — ABNORMAL LOW (ref 135–145)

## 2019-12-06 LAB — GLUCOSE, CAPILLARY
Glucose-Capillary: 296 mg/dL — ABNORMAL HIGH (ref 70–99)
Glucose-Capillary: 395 mg/dL — ABNORMAL HIGH (ref 70–99)
Glucose-Capillary: 360 mg/dL — ABNORMAL HIGH (ref 70–99)

## 2019-12-06 LAB — MAGNESIUM: Magnesium: 1.7 mg/dL (ref 1.7–2.4)

## 2019-12-06 MED ORDER — NYSTATIN 100000 UNIT/GM EX POWD: 1.0000 "application " | Freq: Every day | CUTANEOUS | Status: AC | PRN

## 2019-12-06 MED ORDER — BASAGLAR KWIKPEN 100 UNIT/ML ~~LOC~~ SOPN: 12.0000 [IU] | PEN_INJECTOR | Freq: Every day | SUBCUTANEOUS | Status: DC

## 2019-12-06 MED ORDER — CIPROFLOXACIN HCL 500 MG PO TABS: 500.0000 mg | ORAL_TABLET | Freq: Two times a day (BID) | ORAL | 0 refills | Status: AC

## 2019-12-06 MED ORDER — LEVOTHYROXINE SODIUM 100 MCG PO TABS: 100.0000 ug | ORAL_TABLET | Freq: Every day | ORAL | 3 refills | Status: AC

## 2019-12-06 MED ORDER — TOBRAMYCIN 0.3 % OP SOLN: 1.0000 [drp] | Freq: Two times a day (BID) | OPHTHALMIC | Status: AC

## 2019-12-06 NOTE — Progress Notes (Signed)
Patient to be discharged today. Anthony Dunlap [place] daughter has been informed of discharge and patient returning to home via EMS. Daughter stated that a family member will be at home to received patient.

## 2019-12-06 NOTE — Progress Notes (Signed)
Patient to be discharged home. Anthony Dunlap daughter was informed and AVS discussed via telephone. Appointments and medications discussed with patient's daughter. All questions were answered and no further questions at this time. Patient in stable condition and in no acute distress at time of discharge. Patient transported via [facility].

## 2019-12-06 NOTE — Discharge Summary (Signed)
Physician Discharge Summary   Anthony Dunlap WHQ:[id number] DOB: 1933/05/14 DOA: 12/01/2019  PCP: [name], MD  Admit date: 12/01/2019 Discharge date: 12/06/2019  Admitted From: home Disposition:  home Discharging physician: Dwyane Dee, MD  Recommendations for Outpatient Follow-up:  1. Adjust Lantus further as needed 2. Follow up with ortho 3. Continue routine foley cath exchanges   Patient discharged to home in Discharge Condition: stable CODE STATUS: Full Diet recommendation:  Diet Orders (From admission, onward)    Start     Ordered   12/06/19 0000  Diet Carb Modified        12/06/19 1240   12/04/19 1641  Diet renal/carb modified with fluid restriction Diet-HS Snack? Nothing; Fluid restriction: 2000 mL Fluid; Room service appropriate? Yes; Fluid consistency: Thin  Diet effective now       Question Answer Comment  Diet-HS Snack? Nothing   Fluid restriction: 2000 mL Fluid   Room service appropriate? Yes   Fluid consistency: Thin      12/04/19 1640          Hospital Course: Anthony Dunlap is a 86 y.o. 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 fib who presented on 10/12 with altered mental status and concern for new left-sided weakness and was found to have leukocytosis, thrombocytosis, tachypnea, hyperglycemia, creatinine of 1.27 concerning for sepsis secondary to presumed UTI given abnormal UA in the setting of chronic indwelling Foley.  Patient was started on IV ceftriaxone.  Telemetry neurology was also consulted due to concern for stroke work-up.   MRI brain was negative for any acute intracranial pathology.  Discussed case with urology who recommended simple catheter exchange at bedside given concern for UTI.   Blood cultures remained negative from admission.  Urine culture speciated to Pseudomonas and he was treated with ciprofloxacin.  Course will complete on  12/08/2019. His mentation improved with treatment of his infection.  He was also evaluated by physical therapy with recommendations for SNF at discharge however wished for home health PT instead.  Family aware of decisions and in agreement.   * Sepsis secondary to UTI ([name])-resolved as of 12/05/2019 Presented with tachypnea, leukocytosis positive UA with chronic indwelling Foley and changes in mentation -Foley catheter changed on admission -Patient responding well to ciprofloxacin.  7-day course anticipated, now D4/7 -Blood cultures remain negative -Spoke with daughter, Anthony Dunlap and we will plan for discharge home on Sunday around 12 PM  Hx of BKA, left ([place]) -Recent BKA on 11/11/2019 for treatment of left calcaneal osteomyelitis -Stump healing well with some scabbing noted over incision -Staples removed 12/05/2019 -Still needs to follow-up outpatient with orthopedic surgery  Urinary retention - Foley usually exchanged monthly, last documented exchange on 9/22 (over wire via cystoscopy by urology given concern for retraction of Foley into the prostate) prior to admission.   - Status post exchange on 10/13 (#16) given urethral stricture as recommended by urology over the phone -Continue outpatient care with urology at discharge  Pseudomonas urinary tract infection - see sepsis  Atrial fibrillation with RVR ([place]) -Also considered in setting of infection -Rate is now controlled -Continue Eliquis, amiodarone, Toprol, Cardizem -In the future if recurrent falls or any bradycardia, will need to de-escalate regimen some  PAD (peripheral artery disease) ([place])  Status post bilateral BKA. -Continue Plavix and statin  Decubitus ulcer of sacral region, stage 2 ([place]) -Continue wound care and frequent turning  Hypothyroidism -Continue Synthroid  Type 2 diabetes mellitus ([place]) - continue SSI and CBG monitoring - continue Lantus; still hyperglycemic, dose increased to 12 units daily - may  still need outpatient adjustment  - A1c 8.5% on 11/05/19  AKI (acute kidney injury) ([name])-resolved as of 12/05/2019 -Resolved with fluids  Acute metabolic encephalopathy-resolved as of 12/05/2019 -Consider due to UTI.  Mentation improved with treatment of infection    The patient's chronic medical conditions were treated accordingly per the patient's home medication regimen except as noted.  On day of discharge, patient was felt deemed stable for discharge. Patient/family member advised to call PCP or come back to ER if needed.   Principal Diagnosis: Sepsis secondary to UTI [facility])  Discharge Diagnoses: Active Hospital Problems   Diagnosis Date Noted  . Urinary retention 12/05/2019    Priority: Medium  . Hx of BKA, left ([place]) 12/05/2019    Priority: Medium  . Pseudomonas urinary tract infection 05/08/2018    Priority: Medium  . Atrial fibrillation with RVR ([name])     Priority: Low  . PAD (peripheral artery disease) ([place]) 12/05/2019  . Decubitus ulcer of sacral region, stage 2 ([place]) 12/02/2019  . Hypothyroidism 01/03/2018  . Type 2 diabetes mellitus [facility])     Resolved Hospital Problems   Diagnosis Date Noted Date Resolved  . Sepsis secondary to UTI [facility]) 12/02/2019 12/05/2019    Priority: High  . Acute metabolic encephalopathy 42/59/5638 12/05/2019  . AKI (acute kidney injury) ([place]) 12/02/2019 12/05/2019    Discharge Instructions    Diet Carb Modified   Complete by: As directed    Increase activity slowly   Complete by: As directed    No dressing needed   Complete by: As directed      Allergies as of 12/06/2019      Reactions   Fluconazole Other (See Comments)   Drug-induced hepatitis      Medication List    STOP taking these medications   oxyCODONE 5 MG immediate release tablet Commonly known as: Oxy IR/ROXICODONE     TAKE these medications   acetaminophen 500 MG tablet Commonly known as: TYLENOL Take 1,000 mg by mouth every 6 (six) hours as needed for mild  pain or moderate pain.   ALPRAZolam 0.5 MG tablet Commonly known as: XANAX Take 1 tablet (0.5 mg total) by mouth 2 (two) times daily as needed for anxiety.   amiodarone 200 MG tablet Commonly known as: PACERONE Take 1 tablet (200 mg total) by mouth 2 (two) times daily for 14 days. Amiodarone 200mg  BID for 14 days and then once daily.   amitriptyline 10 MG tablet Commonly known as: ELAVIL Take 20 mg by mouth at bedtime.   apixaban 5 MG Tabs tablet Commonly known as: ELIQUIS Take 1 tablet (5 mg total) by mouth 2 (two) times daily.   Basaglar KwikPen 100 UNIT/ML Inject 12 Units into the skin daily. What changed:   how much to take  Another medication with the same name was removed. Continue taking this medication, and follow the directions you see here.   ciprofloxacin 500 MG tablet Commonly known as: CIPRO Take 1 tablet (500 mg total) by mouth 2 (two) times daily for 2 days.   clopidogrel 75 MG tablet Commonly known as: Plavix Take 1 tablet (75 mg total) by mouth daily.   colchicine 0.6 MG tablet Take 0.6 mg by mouth daily as needed (gout attacks).   diltiazem 300 MG 24 hr capsule Commonly known as: CARDIZEM CD Take 1 capsule (300 mg  total) by mouth daily. What changed: Another medication with the same name was removed. Continue taking this medication, and follow the directions you see here.   ferrous sulfate 325 (65 FE) MG tablet Take 1 tablet (325 mg total) by mouth 2 (two) times daily with a meal.   insulin aspart 100 UNIT/ML injection Commonly known as: novoLOG Inject 0-15 Units into the skin 3 (three) times daily with meals. Sliding scale insulin Less than 70 initiate hypoglycemia protocol 70-120  0 units 120-150 2 unit 151-200 3 units 201-250 3 units 251-300 5 units 301-350 8 units 351-400 11 units  Greater than 400 15 units , call MD What changed:   how much to take  when to take this  additional instructions   levothyroxine 100 MCG  tablet Commonly known as: SYNTHROID Take 1 tablet (100 mcg total) by mouth daily with breakfast. Start taking on: December 07, 2019 What changed:   how much to take  Another medication with the same name was removed. Continue taking this medication, and follow the directions you see here.   metoprolol succinate 100 MG 24 hr tablet Commonly known as: TOPROL-XL Take 1 tablet (100 mg total) by mouth daily. Take with or immediately following a meal. What changed: Another medication with the same name was removed. Continue taking this medication, and follow the directions you see here.   multivitamin with minerals Tabs tablet Take 1 tablet by mouth daily.   nutrition supplement (JUVEN) Pack Take 1 packet by mouth 2 (two) times daily between meals. What changed: Another medication with the same name was removed. Continue taking this medication, and follow the directions you see here.   nystatin powder Commonly known as: MYCOSTATIN/NYSTOP Apply 1 application topically daily as needed (yeast).   oxybutynin 5 MG 24 hr tablet Commonly known as: DITROPAN-XL Take 1 tablet (5 mg total) by mouth at bedtime.   pantoprazole 40 MG tablet Commonly known as: PROTONIX Take 1 tablet (40 mg total) by mouth daily.   pravastatin 10 MG tablet Commonly known as: PRAVACHOL Take 10 mg by mouth daily with supper.   senna-docusate 8.6-50 MG tablet Commonly known as: Senokot-S Take 1 tablet by mouth 2 (two) times daily. What changed:   when to take this  reasons to take this   silver sulfADIAZINE 1 % cream Commonly known as: SILVADENE Apply topically 2 (two) times daily.   tobramycin 0.3 % ophthalmic solution Commonly known as: TOBREX Place 1 drop into both eyes in the morning and at bedtime.            Discharge Care Instructions  (From admission, onward)         Start     Ordered   12/06/19 0000  No dressing needed        12/06/19 1240          Allergies  Allergen  Reactions  . Fluconazole Other (See Comments)    Drug-induced hepatitis    Consultations: none  Discharge Exam: BP 124/73   Pulse (!) 101   Temp 98.4 F (36.9 C)   Resp 18   Ht 5\' 8"  (1.727 m)   Wt 65.9 kg   SpO2 97%   BMI 22.09 kg/m  General appearance: alert, cooperative and no distress Head: Normocephalic, without obvious abnormality, atraumatic Eyes: EOMI Lungs: clear to auscultation bilaterally Heart: regular rate and rhythm and S1, S2 normal Abdomen: normal findings: bowel sounds normal and soft, non-tender Extremities: b/l BKA noted; B/L scabs at stump  sites; LLE staples removed  Skin: mobility and turgor normal Neurologic: Grossly normal  The results of significant diagnostics from this hospitalization (including imaging, microbiology, ancillary and laboratory) are listed below for reference.   Microbiology: Recent Results (from the past 240 hour(s))  Culture, blood (routine x 2)     Status: None (Preliminary result)   Collection Time: 12/01/19 10:51 PM   Specimen: BLOOD RIGHT HAND  Result Value Ref Range Status   Specimen Description BLOOD RIGHT HAND  Final   Special Requests   Final    BOTTLES DRAWN AEROBIC AND ANAEROBIC Blood Culture adequate volume   Culture   Final    NO GROWTH 4 DAYS Performed at [facility], [address], [place], [place] [address]    Report Status PENDING  Incomplete  Respiratory Panel by RT PCR (Flu A&B, Covid) - Nasopharyngeal Swab     Status: None   Collection Time: 12/01/19 11:00 PM   Specimen: Nasopharyngeal Swab  Result Value Ref Range Status   SARS Coronavirus 2 by RT PCR NEGATIVE NEGATIVE Final    Comment: (NOTE) SARS-CoV-2 target nucleic acids are NOT DETECTED.  The SARS-CoV-2 RNA is generally detectable in upper respiratoy specimens during the acute phase of infection. The lowest concentration of SARS-CoV-2 viral copies this assay can detect is 131 copies/mL. A negative result does not preclude SARS-Cov-2 infection  and should not be used as the sole basis for treatment or other patient management decisions. A negative result may occur with  improper specimen collection/handling, submission of specimen other than nasopharyngeal swab, presence of viral mutation(s) within the areas targeted by this assay, and inadequate number of viral copies (<131 copies/mL). A negative result must be combined with clinical observations, patient history, and epidemiological information. The expected result is Negative.  Fact Sheet for Patients:  PinkCheek.be  Fact Sheet for Healthcare Providers:  GravelBags.it  This test is no t yet approved or cleared by the [place] FDA and  has been authorized for detection and/or diagnosis of SARS-CoV-2 by FDA under an Emergency Use Authorization (EUA). This EUA will remain  in effect (meaning this test can be used) for the duration of the COVID-19 declaration under Section 564(b)(1) of the Act, 21 U.S.C. section 360bbb-3(b)(1), unless the authorization is terminated or revoked sooner.     Influenza A by PCR NEGATIVE NEGATIVE Final   Influenza B by PCR NEGATIVE NEGATIVE Final    Comment: (NOTE) The Xpert Xpress SARS-CoV-2/FLU/RSV assay is intended as an aid in  the diagnosis of influenza from Nasopharyngeal swab specimens and  should not be used as a sole basis for treatment. Nasal washings and  aspirates are unacceptable for Xpert Xpress SARS-CoV-2/FLU/RSV  testing.  Fact Sheet for Patients: PinkCheek.be  Fact Sheet for Healthcare Providers: GravelBags.it  This test is not yet approved or cleared by the [place] FDA and  has been authorized for detection and/or diagnosis of SARS-CoV-2 by  FDA under an Emergency Use Authorization (EUA). This EUA will remain  in effect (meaning this test can be used) for the duration of the  Covid-19 declaration  under Section 564(b)(1) of the Act, 21  U.S.C. section 360bbb-3(b)(1), unless the authorization is  terminated or revoked. Performed at [facility], [address], [place], [place] [address]   Culture, blood (routine x 2)     Status: None (Preliminary result)   Collection Time: 12/01/19 11:04 PM   Specimen: BLOOD RIGHT HAND  Result Value Ref Range Status   Specimen Description  BLOOD RIGHT HAND  Final   Special Requests   Final    BOTTLES DRAWN AEROBIC AND ANAEROBIC Blood Culture adequate volume   Culture   Final    NO GROWTH 4 DAYS Performed at [facility], [address], [place], [place] [address]    Report Status PENDING  Incomplete  Urine culture     Status: Abnormal   Collection Time: 12/02/19  1:35 AM   Specimen: Urine, Clean Catch  Result Value Ref Range Status   Specimen Description   Final    URINE, CLEAN CATCH Performed at [facility], [address], [place], [place] [address]    Special Requests   Final    NONE Performed at [facility], [address], [place], [place] [address]    Culture (A)  Final    >=100,000 COLONIES/mL PSEUDOMONAS AERUGINOSA 10,000 COLONIES/mL YEAST    Report Status 12/04/2019 FINAL  Final   Organism ID, Bacteria PSEUDOMONAS AERUGINOSA (A)  Final      Susceptibility   Pseudomonas aeruginosa - MIC*    CEFTAZIDIME 16 INTERMEDIATE Intermediate     CIPROFLOXACIN 1 SENSITIVE Sensitive     GENTAMICIN <=1 SENSITIVE Sensitive     IMIPENEM 2 SENSITIVE Sensitive     * >=100,000 COLONIES/mL PSEUDOMONAS AERUGINOSA     Labs: BNP (last 3 results) No results for input(s): BNP in the last 8760 hours. Basic Metabolic Panel: Recent Labs  Lab 12/02/19 0510 12/03/19 0658 12/04/19 0512 12/05/19 1041 12/06/19 0537  NA 136 137 133* 134* 132*  K 4.2 4.6 3.6 3.5 3.6  CL 103 102 102 102 103  CO2 23 24 22  21* 21*  GLUCOSE 237* 251* 251* 327* 282*  BUN 21 18 23  27* 31*  CREATININE 1.05 1.00 0.91 1.09 0.90  CALCIUM 8.8* 9.1 8.9 9.0 9.0  MG 1.9  --    --   --  1.7  PHOS 3.6  --   --   --   --    Liver Function Tests: Recent Labs  Lab 12/01/19 2238 12/02/19 0510 12/03/19 0658 12/04/19 0512 12/05/19 1041  AST 14* 13* 12* 10* 15  ALT 14 14 12 9 12   ALKPHOS 185* 179* 169* 146* 150*  BILITOT 0.4 0.5 0.5 0.5 0.6  PROT 7.6 7.5 7.5 6.6 7.2  ALBUMIN 2.9* 3.0* 3.0* 2.6* 2.7*   No results for input(s): LIPASE, AMYLASE in the last 168 hours. No results for input(s): AMMONIA in the last 168 hours. CBC: Recent Labs  Lab 12/01/19 2238 12/01/19 2238 12/02/19 0510 12/03/19 0658 12/04/19 0512 12/05/19 1041 12/06/19 0537  WBC 13.5*   < > 10.3 11.5* 9.5 12.2* 10.7*  NEUTROABS 9.8*  --   --   --   --   --  7.8*  HGB 9.9*   < > 9.5* 9.8* 8.9* 8.9* 8.7*  HCT 32.8*   < > 31.8* 33.7* 28.9* 29.2* 28.2*  MCV 84.1   < > 83.0 84.3 81.6 82.7 80.8  PLT 746*   < > 684* 653* 542* 484* 445*   < > = values in this interval not displayed.   Cardiac Enzymes: No results for input(s): CKTOTAL, CKMB, CKMBINDEX, TROPONINI in the last 168 hours. BNP: Invalid input(s): POCBNP CBG: Recent Labs  Lab 12/05/19 1130 12/05/19 1622 12/05/19 2101 12/06/19 0755 12/06/19 1155  GLUCAP 288* 313* 258* 296* 395*   D-Dimer No results for input(s): DDIMER in the last 72 hours. Hgb A1c No results for input(s): HGBA1C in the last  72 hours. Lipid Profile No results for input(s): CHOL, HDL, LDLCALC, TRIG, CHOLHDL, LDLDIRECT in the last 72 hours. Thyroid function studies No results for input(s): TSH, T4TOTAL, T3FREE, THYROIDAB in the last 72 hours.  Invalid input(s): FREET3 Anemia work up No results for input(s): VITAMINB12, FOLATE, FERRITIN, TIBC, IRON, RETICCTPCT in the last 72 hours. Urinalysis    Component Value Date/Time   COLORURINE AMBER (A) 12/02/2019 0004   APPEARANCEUR TURBID (A) 12/02/2019 0004   LABSPEC 1.017 12/02/2019 0004   PHURINE 5.0 12/02/2019 0004   GLUCOSEU NEGATIVE 12/02/2019 0004   HGBUR LARGE (A) 12/02/2019 0004   BILIRUBINUR  NEGATIVE 12/02/2019 0004   KETONESUR 5 (A) 12/02/2019 0004   PROTEINUR 100 (A) 12/02/2019 0004   UROBILINOGEN 0.2 09/03/2013 0132   NITRITE POSITIVE (A) 12/02/2019 0004   LEUKOCYTESUR LARGE (A) 12/02/2019 0004   Sepsis Labs Invalid input(s): PROCALCITONIN,  WBC,  LACTICIDVEN Microbiology Recent Results (from the past 240 hour(s))  Culture, blood (routine x 2)     Status: None (Preliminary result)   Collection Time: 12/01/19 10:51 PM   Specimen: BLOOD RIGHT HAND  Result Value Ref Range Status   Specimen Description BLOOD RIGHT HAND  Final   Special Requests   Final    BOTTLES DRAWN AEROBIC AND ANAEROBIC Blood Culture adequate volume   Culture   Final    NO GROWTH 4 DAYS Performed at [facility], [address], [place], [place] [address]    Report Status PENDING  Incomplete  Respiratory Panel by RT PCR (Flu A&B, Covid) - Nasopharyngeal Swab     Status: None   Collection Time: 12/01/19 11:00 PM   Specimen: Nasopharyngeal Swab  Result Value Ref Range Status   SARS Coronavirus 2 by RT PCR NEGATIVE NEGATIVE Final    Comment: (NOTE) SARS-CoV-2 target nucleic acids are NOT DETECTED.  The SARS-CoV-2 RNA is generally detectable in upper respiratoy specimens during the acute phase of infection. The lowest concentration of SARS-CoV-2 viral copies this assay can detect is 131 copies/mL. A negative result does not preclude SARS-Cov-2 infection and should not be used as the sole basis for treatment or other patient management decisions. A negative result may occur with  improper specimen collection/handling, submission of specimen other than nasopharyngeal swab, presence of viral mutation(s) within the areas targeted by this assay, and inadequate number of viral copies (<131 copies/mL). A negative result must be combined with clinical observations, patient history, and epidemiological information. The expected result is Negative.  Fact Sheet for Patients:   PinkCheek.be  Fact Sheet for Healthcare Providers:  GravelBags.it  This test is no t yet approved or cleared by the [place] FDA and  has been authorized for detection and/or diagnosis of SARS-CoV-2 by FDA under an Emergency Use Authorization (EUA). This EUA will remain  in effect (meaning this test can be used) for the duration of the COVID-19 declaration under Section 564(b)(1) of the Act, 21 U.S.C. section 360bbb-3(b)(1), unless the authorization is terminated or revoked sooner.     Influenza A by PCR NEGATIVE NEGATIVE Final   Influenza B by PCR NEGATIVE NEGATIVE Final    Comment: (NOTE) The Xpert Xpress SARS-CoV-2/FLU/RSV assay is intended as an aid in  the diagnosis of influenza from Nasopharyngeal swab specimens and  should not be used as a sole basis for treatment. Nasal washings and  aspirates are unacceptable for Xpert Xpress SARS-CoV-2/FLU/RSV  testing.  Fact Sheet for Patients: PinkCheek.be  Fact Sheet for Healthcare Providers: GravelBags.it  This test is  not yet approved or cleared by the [place] and  has been authorized for detection and/or diagnosis of SARS-CoV-2 by  FDA under an Emergency Use Authorization (EUA). This EUA will remain  in effect (meaning this test can be used) for the duration of the  Covid-19 declaration under Section 564(b)(1) of the Act, 21  U.S.C. section 360bbb-3(b)(1), unless the authorization is  terminated or revoked. Performed at [facility], [address] [place], [place] [address]   Culture, blood (routine x 2)     Status: None (Preliminary result)   Collection Time: 12/01/19 11:04 PM   Specimen: BLOOD RIGHT HAND  Result Value Ref Range Status   Specimen Description BLOOD RIGHT HAND  Final   Special Requests   Final    BOTTLES DRAWN AEROBIC AND ANAEROBIC Blood Culture adequate volume   Culture    Final    NO GROWTH 4 DAYS Performed at [facility], [address], [place], [place] [address]    Report Status PENDING  Incomplete  Urine culture     Status: Abnormal   Collection Time: 12/02/19  1:35 AM   Specimen: Urine, Clean Catch  Result Value Ref Range Status   Specimen Description   Final    URINE, CLEAN CATCH Performed at [facility], [address], [place], [place] [address]    Special Requests   Final    NONE Performed at [facility], [address] [place],  [address]    Culture (A)  Final    >=100,000 COLONIES/mL PSEUDOMONAS AERUGINOSA 10,000 COLONIES/mL YEAST    Report Status 12/04/2019 FINAL  Final   Organism ID, Bacteria PSEUDOMONAS AERUGINOSA (A)  Final      Susceptibility   Pseudomonas aeruginosa - MIC*    CEFTAZIDIME 16 INTERMEDIATE Intermediate     CIPROFLOXACIN 1 SENSITIVE Sensitive     GENTAMICIN <=1 SENSITIVE Sensitive     IMIPENEM 2 SENSITIVE Sensitive     * >=100,000 COLONIES/mL PSEUDOMONAS AERUGINOSA    Procedures/Studies: CT ABDOMEN PELVIS WO CONTRAST  Result Date: 11/20/2019 CLINICAL DATA:  Abdominal pain and distension. EXAM: CT ABDOMEN AND PELVIS WITHOUT CONTRAST TECHNIQUE: Multidetector CT imaging of the abdomen and pelvis was performed following the standard protocol without IV contrast. COMPARISON:  CT 2 weeks ago 11/05/2019, pelvis MRI 01/25/2019 FINDINGS: Lower chest: Improved right but slightly worsening left pleural effusion. Adjacent atelectasis. High density pericardial fluid which has slightly diminished in volume from prior exam. There are coronary artery calcifications. Motion obscures detailed assessment. Hepatobiliary: No obvious focal liver abnormality, partially obscured by motion. Motion through the gallbladder without calcified gallstone. There is no biliary dilatation. Pancreas: Parenchymal atrophy. No ductal dilatation or inflammation. Spleen: Motion limited, no gross acute abnormality. Adrenals/Urinary Tract: Mild adrenal  thickening without dominant nodule. Motion through the kidneys. There is no hydronephrosis. Mild bilateral renal parenchymal atrophy. Suggestion of mild symmetric perinephric edema, motion obscured. No visualized renal calculi. Urinary bladder is decompressed by catheter. Stomach/Bowel: Similar appearance of gaseous distension and redundancy of the sigmoid colon. Large stool burden in the ascending, transverse, and proximal descending colon. Mixed liquid and solid stool within the rectum. No obvious rectal wall thickening. Administered enteric contrast reaches the distal small bowel. Inguinal canals are patulous with small bowel approaching but not entering. No small bowel obstruction. Partially distended stomach, primarily decompressed. Vascular/Lymphatic: Aorto bi-iliac atherosclerosis. Aortic branch atherosclerosis. Small left inguinal nodes largest measuring 9 mm short axis, unchanged. Reproductive: Brachytherapy seeds in the prostate gland. Other: No free air  or ascites/free fluid. Small fat containing umbilical hernia. 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 Right hip arthroplasty. There is a small left hip joint effusion. Left hip osteoarthritis. Degenerative change in the lumbar spine with primarily facet hypertrophy. Enthesopathic change involving the hamstring insertions. No acute osseous abnormalities. IMPRESSION: 1. No acute abnormality in the abdomen/pelvis. 2. Large stool burden with gaseous distension and redundancy of the sigmoid colon, similar to prior exam. Mixed liquid and solid stool within the rectum without obvious rectal wall thickening. Overall findings consistent with constipation. There is no bowel obstruction. 3. Improved right but slightly worsening small left pleural effusion. High density pericardial fluid has slightly diminished in volume from prior exam. 4. Diminished right inguinal hernia, inguinal canal appears  patulous on the current exam. No bowel involvement currently. 5. Chronic osteomyelitis of the pubic symphysis. Aortic Atherosclerosis (ICD10-I70.0). Electronically Signed   By: Keith Rake M.D.   On: 11/20/2019 23:40   DG Chest 1 View  Result Date: 12/01/2019 CLINICAL DATA:  Altered mental status EXAM: CHEST  1 VIEW COMPARISON:  11/19/2019 FINDINGS: Cardiac shadow is within normal limits. Tortuous thoracic aorta is noted. The lungs are well aerated without focal infiltrate or sizable effusion. Degenerative changes of the thoracic spine are noted. IMPRESSION: No active disease. Electronically Signed   By: Inez Catalina M.D.   On: 12/01/2019 23:53   DG Abd 1 View  Result Date: 11/08/2019 CLINICAL DATA:  Follow-up for abdominal distension EXAM: ABDOMEN - 1 VIEW COMPARISON:  None. FINDINGS: Gaseous distension of the small bowel and colon. Moderate amount of stool throughout the colon. No evidence of pneumoperitoneum, portal venous gas or pneumatosis. No pathologic calcifications along the expected course of the ureters. No acute osseous abnormality. IMPRESSION: Gaseous distension of the small bowel and colon as can be seen with an ileus. Electronically Signed   By: Kathreen Devoid   On: 11/08/2019 15:47   CT Head Wo Contrast  Result Date: 12/01/2019 CLINICAL DATA:  Altered mental status, slurred speech EXAM: CT HEAD WITHOUT CONTRAST TECHNIQUE: Contiguous axial images were obtained from the base of the skull through the vertex without intravenous contrast. COMPARISON:  MRI 08/20/2013 (images only) CT head 08/10/2013 (report only) FINDINGS: Brain: No evidence of acute infarction, hemorrhage, hydrocephalus, extra-axial collection, visible mass lesion or mass effect. Symmetric prominence of the ventricles, cisterns and sulci compatible with parenchymal volume loss. Patchy areas of white matter hypoattenuation are most compatible with chronic microvascular angiopathy. Vascular: Atherosclerotic calcification of  the carotid siphons and intradural vertebral arteries. No hyperdense vessel. Skull: No calvarial fracture or suspicious osseous lesion. No scalp swelling or hematoma. Sinuses/Orbits: Paranasal sinuses and mastoid air cells are predominantly clear. Orbital structures are unremarkable aside from prior lens extractions. Other: Debris in the right external auditory canal. IMPRESSION: 1. No acute intracranial findings. If there is persisting concern for acute infarction, MRI is more sensitive and specific for early features of ischemia. 2. Chronic microvascular angiopathy and parenchymal volume loss. 3. Debris in the right external auditory canal, correlate for cerumen impaction. Electronically Signed   By: Lovena Le M.D.   On: 12/01/2019 23:29   MR BRAIN WO CONTRAST  Result Date: 12/02/2019 CLINICAL DATA:  Altered mental status, weakness EXAM: MRI HEAD WITHOUT CONTRAST TECHNIQUE: Multiplanar, multiecho pulse sequences of the brain and surrounding structures were obtained without intravenous contrast. COMPARISON:  2015 FINDINGS: Brain: There is no acute infarction or intracranial hemorrhage. There is no intracranial mass, mass  effect, or edema. There is no hydrocephalus or extra-axial fluid collection. Ventricles and sulci prominent compatible with generalized parenchymal volume loss, which has progressed since 2015. Patchy and confluent areas of T2 hyperintensity in the supratentorial greater than pontine white matter are nonspecific but probably reflect moderate chronic microvascular ischemic changes, which have also progressed. Vascular: Major vessel flow voids at the skull base are preserved. Skull and upper cervical spine: Normal marrow signal is preserved. Sinuses/Orbits: Trace mucosal thickening.  Orbits are unremarkable. Other: Sella is unremarkable.  Bilateral lens replacements. IMPRESSION: No acute infarction, hemorrhage, or mass. Progression of chronic microvascular ischemic changes and parenchymal  volume loss since 2015. Electronically Signed   By: Macy Mis M.D.   On: 12/02/2019 09:01   MR FOOT LEFT W WO CONTRAST  Result Date: 11/08/2019 CLINICAL DATA:  Osteomyelitis of the foot. Plain foot soft tissue wound EXAM: MRI OF THE LEFT FOREFOOT WITHOUT AND WITH CONTRAST TECHNIQUE: Multiplanar, multisequence MR imaging of the left foot was performed both before and after administration of intravenous contrast. CONTRAST:  56mL GADAVIST GADOBUTROL 1 MMOL/ML IV SOLN COMPARISON:  None. FINDINGS: TENDONS Peroneal: Peroneal longus tendon intact. Peroneal brevis intact. Posteromedial: Posterior tibial tendon intact. Flexor hallucis longus tendon intact. Flexor digitorum longus tendon intact. Anterior: Tibialis anterior tendon intact. Extensor hallucis longus tendon intact Extensor digitorum longus tendon intact. Achilles:  Mild tendinosis of the distal Achilles tendon. Plantar Fascia: Thickening of the medial band of the plantar fascia at the calcaneal insertion as can be seen with mild plantar fasciitis. LIGAMENTS Lateral: Anterior talofibular ligament intact. Calcaneofibular ligament intact. Posterior talofibular ligament intact. Anterior and posterior tibiofibular ligaments intact. Medial: Deltoid ligament intact. Spring ligament intact. CARTILAGE Ankle Joint: No joint effusion. Normal ankle mortise. No chondral defect. Subtalar Joints/Sinus Tarsi: Normal subtalar joints. No subtalar joint effusion. Normal sinus tarsi. Bones/Soft Tissue: Soft tissue wound along the posterolateral aspect of the hindfoot overlying the posterior calcaneus. Underlying cortical destruction of the posterolateral calcaneus just inferior to the Achilles insertion with severe surrounding marrow edema most consistent with osteomyelitis. No drainable fluid collection. Soft tissue edema throughout the hindfoot extending into the ankle concerning for cellulitis. T2 hyperintense signal throughout the plantar musculature likely neurogenic.  IMPRESSION: 1. Soft tissue wound along the posterolateral aspect of the hindfoot overlying the posterior calcaneus. Underlying cortical destruction of the posterolateral calcaneus just inferior to the Achilles insertion with severe surrounding marrow edema most consistent with osteomyelitis with surrounding cellulitis. No drainable fluid collection. 2. Mild tendinosis of the distal Achilles tendon. 3. Thickening of the medial band of the plantar fascia at the calcaneal insertion as can be seen with mild plantar fasciitis. Electronically Signed   By: Kathreen Devoid   On: 11/08/2019 11:46   US Carotid Bilateral  Result Date: 12/02/2019 CLINICAL DATA:  Concern for stroke. EXAM: BILATERAL CAROTID DUPLEX ULTRASOUND TECHNIQUE: Pearline Cables scale imaging, color Doppler and duplex ultrasound were performed of bilateral carotid and vertebral arteries in the neck. COMPARISON:  None. FINDINGS: Criteria: Quantification of carotid stenosis is based on velocity parameters that correlate the residual internal carotid diameter with NASCET-based stenosis levels, using the diameter of the distal internal carotid lumen as the denominator for stenosis measurement. The following velocity measurements were obtained: RIGHT ICA: 71/12 cm/sec CCA: 40/98 cm/sec SYSTOLIC ICA/CCA RATIO:  1.0 ECA: 91 cm/sec LEFT ICA: 66/22 cm/sec CCA: 11/91 cm/sec SYSTOLIC ICA/CCA RATIO:  0.8 ECA: 69 cm/sec RIGHT CAROTID ARTERY: Echogenic plaque in the distal common carotid artery and carotid bulb. External carotid artery  is patent with normal waveform. Normal waveforms and velocities in the internal carotid artery. Distal internal carotid artery is very tortuous. RIGHT VERTEBRAL ARTERY: Antegrade flow and normal waveform in the right vertebral artery. LEFT CAROTID ARTERY: Small amount of heterogeneous plaque in the proximal left common carotid artery. Small amount of plaque at the left carotid bulb. External carotid artery is patent with normal waveform. Normal  waveforms and velocities in the internal carotid artery. LEFT VERTEBRAL ARTERY: Antegrade flow and normal waveform in the left vertebral artery. IMPRESSION: 1. Mild atherosclerotic disease involving the carotid arteries without significant stenosis. Estimated degree of stenosis in the internal carotid arteries is less than 50% bilaterally. 2. Patent vertebral arteries with antegrade flow. Electronically Signed   By: Markus Daft M.D.   On: 12/02/2019 13:34   DG Chest Port 1 View  Result Date: 11/19/2019 CLINICAL DATA:  Fever. EXAM: PORTABLE CHEST 1 VIEW COMPARISON:  11/05/2019 FINDINGS: Borderline cardiomegaly. Unchanged mediastinal contours. Mild chronic elevation of left hemidiaphragm with adjacent atelectasis. Confluent consolidation. No pulmonary edema. No pneumothorax. No significant pleural effusion. Stable osseous structures with chronic degenerative change of the left shoulder. IMPRESSION: 1. No evidence of pneumonia.  Borderline cardiomegaly. 2. Chronic elevation of left hemidiaphragm with adjacent atelectasis. Electronically Signed   By: Keith Rake M.D.   On: 11/19/2019 17:53   ECHOCARDIOGRAM LIMITED  Result Date: 12/02/2019    ECHOCARDIOGRAM LIMITED REPORT   Patient Name:   Khiree D Braithwaite Date of Exam: 12/02/2019 Medical Rec #:  [id number]      Height:       68.0 in Accession #:    [id number]     Weight:       145.3 lb Date of Birth:  Jan 01, 1934      BSA:          1.784 m Patient Age:    24 years       BP:           138/77 mmHg Patient Gender: M              HR:           88 bpm. Exam Location:  [facility] Procedure: Limited Echo Indications:    Stroke 434.91 / I163.9  History:        Patient has prior history of Echocardiogram examinations, most                 recent 11/06/2019. Arrythmias:Atrial Flutter and Atrial                 Fibrillation; Risk Factors:Dyslipidemia, Diabetes and                 Non-Smoker. Prostate cancer.  Sonographer:    Leavy Cella RDCS (AE) Referring Phys:  [id number] OLADAPO ADEFESO IMPRESSIONS  1. Left ventricular ejection fraction, by estimation, is 55 to 60%. The left ventricle has normal function. Left ventricular endocardial border not optimally defined to evaluate regional wall motion. There is moderate left ventricular hypertrophy. Left ventricular diastolic parameters are indeterminate.  2. Right ventricular systolic function is normal. The right ventricular size is normal. Tricuspid regurgitation signal is inadequate for assessing PA pressure.  3. The mitral valve is grossly normal. Trivial mitral valve regurgitation. Moderate mitral annular calcification.  4. The aortic valve is tricuspid. Aortic valve regurgitation is not visualized.  5. Unable to estimate CVP. FINDINGS  Left Ventricle: Left ventricular ejection fraction, by estimation, is 55 to 60%. The left ventricle has normal  function. Left ventricular endocardial border not optimally defined to evaluate regional wall motion. The left ventricular internal cavity size was normal in size. There is moderate left ventricular hypertrophy. Left ventricular diastolic parameters are indeterminate. Right Ventricle: The right ventricular size is normal. No increase in right ventricular wall thickness. Right ventricular systolic function is normal. Tricuspid regurgitation signal is inadequate for assessing PA pressure. Pericardium: There is no evidence of pericardial effusion. Presence of pericardial fat pad. Mitral Valve: The mitral valve is grossly normal. Moderate mitral annular calcification. Trivial mitral valve regurgitation. Tricuspid Valve: The tricuspid valve is not well visualized. Tricuspid valve regurgitation is trivial. Aortic Valve: The aortic valve is tricuspid. There is mild aortic valve annular calcification. Aortic valve regurgitation is not visualized. Pulmonic Valve: The pulmonic valve was not well visualized. Pulmonic valve regurgitation is trivial. Aorta: The aortic root is normal in size and  structure. Venous: Unable to estimate CVP. The inferior vena cava was not well visualized. IAS/Shunts: No atrial level shunt detected by color flow Doppler. LEFT VENTRICLE PLAX 2D LVIDd:         2.90 cm LVIDs:         2.27 cm LV PW:         1.45 cm LV IVS:        1.25 cm LVOT diam:     2.10 cm LVOT Area:     3.46 cm  LEFT ATRIUM         Index LA diam:    3.50 cm 1.96 cm/m   AORTA Ao Root diam: 3.00 cm MITRAL VALVE MV Area (PHT): 3.85 cm     SHUNTS MV Decel Time: 197 msec     Systemic Diam: 2.10 cm MV E velocity: 56.60 cm/s MV A velocity: 101.00 cm/s MV E/A ratio:  0.56 Rozann Lesches MD Electronically signed by Rozann Lesches MD Signature Date/Time: 12/02/2019/11:17:33 AM    Final      Time coordinating discharge: Over 30 minutes    Dwyane Dee, MD  [facility] 12/06/2019, 1:49 PM

## 2019-12-07 LAB — CULTURE, BLOOD (ROUTINE X 2)
Culture: NO GROWTH
Culture: NO GROWTH
Special Requests: ADEQUATE
Special Requests: ADEQUATE

## 2019-12-08 ENCOUNTER — Ambulatory Visit: Payer: Medicare Other | Admitting: Family Medicine

## 2019-12-14 ENCOUNTER — Emergency Department (HOSPITAL_COMMUNITY)
Admission: EM | Admit: 2019-12-14 | Discharge: 2019-12-14 | Disposition: A | Discharge status: Home / Self Care | Payer: Medicare Other | Attending: Emergency Medicine | Admitting: Emergency Medicine

## 2019-12-14 ENCOUNTER — Ambulatory Visit: Payer: Medicare Other | Admitting: Cardiology

## 2019-12-14 ENCOUNTER — Encounter (HOSPITAL_COMMUNITY): Payer: Self-pay | Admitting: *Deleted

## 2019-12-14 ENCOUNTER — Other Ambulatory Visit: Payer: Self-pay

## 2019-12-14 DIAGNOSIS — Z7901 Long term (current) use of anticoagulants: Secondary | ICD-10-CM | POA: Diagnosis not present

## 2019-12-14 DIAGNOSIS — E1165 Type 2 diabetes mellitus with hyperglycemia: Secondary | ICD-10-CM | POA: Insufficient documentation

## 2019-12-14 DIAGNOSIS — T8383XA Hemorrhage of genitourinary prosthetic devices, implants and grafts, initial encounter: Secondary | ICD-10-CM | POA: Insufficient documentation

## 2019-12-14 DIAGNOSIS — Z794 Long term (current) use of insulin: Secondary | ICD-10-CM | POA: Insufficient documentation

## 2019-12-14 DIAGNOSIS — R319 Hematuria, unspecified: Secondary | ICD-10-CM | POA: Diagnosis present

## 2019-12-14 DIAGNOSIS — E039 Hypothyroidism, unspecified: Secondary | ICD-10-CM | POA: Diagnosis not present

## 2019-12-14 DIAGNOSIS — Z79899 Other long term (current) drug therapy: Secondary | ICD-10-CM | POA: Diagnosis not present

## 2019-12-14 DIAGNOSIS — Z8546 Personal history of malignant neoplasm of prostate: Secondary | ICD-10-CM | POA: Diagnosis not present

## 2019-12-14 DIAGNOSIS — Z96641 Presence of right artificial hip joint: Secondary | ICD-10-CM | POA: Insufficient documentation

## 2019-12-14 LAB — URINALYSIS, MICROSCOPIC (REFLEX): RBC / HPF: >50 RBC/hpf (ref 0–5)

## 2019-12-14 LAB — URINALYSIS, ROUTINE W REFLEX MICROSCOPIC

## 2019-12-14 LAB — CBC
HCT: 25.6 % — ABNORMAL LOW (ref 39.0–52.0)
Hemoglobin: 7.6 g/dL — ABNORMAL LOW (ref 13.0–17.0)
MCH: 25.1 pg — ABNORMAL LOW (ref 26.0–34.0)
MCHC: 29.7 g/dL — ABNORMAL LOW (ref 30.0–36.0)
MCV: 84.5 fL (ref 80.0–100.0)
Platelets: 421 10*3/uL — ABNORMAL HIGH (ref 150–400)
RBC: 3.03 MIL/uL — ABNORMAL LOW (ref 4.22–5.81)
RDW: 17.7 % — ABNORMAL HIGH (ref 11.5–15.5)
WBC: 10.8 10*3/uL — ABNORMAL HIGH (ref 4.0–10.5)
nRBC: 0 % (ref 0.0–0.2)

## 2019-12-14 NOTE — Discharge Instructions (Signed)
Dr. Noah Delaine should be making an appointment for you, if you do not hear from his office in a day or 2 please call the office to schedule an appointment.  Return to the ER for any new or worsening symptoms.

## 2019-12-14 NOTE — ED Notes (Signed)
Irrigated catheter with 500 solution. Large amount of blood clots returned. Urine continues to be be blood tinged, overall improved.

## 2019-12-14 NOTE — ED Provider Notes (Signed)
[facility] EMERGENCY DEPARTMENT Provider Note   CSN: [id number] Arrival date & time: 12/14/19  1310     History Chief Complaint  Patient presents with  . Hematuria    Anthony Dunlap is a 86 y.o. male.  HPI 86 year old male with an extensive medical history including urinary retention over the chronic Foley catheter, A. fib on Eliquis, DM type II, hypothyroidism, prostate cancer presents to the ER with 1 day of blood in his Foley.  Patient denies any pain, states Foley appears to be working normally.  Denies any fevers or chills.  He was recently discharged from the hospital on 10/17 with concerns for a stroke, work-up was reassuring, however UA did show UTI with no sepsis.  He just finished a course of Rocephin.  He cannot remember his urologist name but does states [facility].  Denies any flank pain, nausea, vomiting, fevers, chills, weakness.    Past Medical History:  Diagnosis Date  . Chronic heel ulcer, left, limited to breakdown of skin ([place]) 10/28/2019  . 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 ([place]) 04/21/2018  . Diabetes ([place])   . Drug-induced hepatitis 06/05/2018  . Inguinal hernia    06/09/2019: per patient " a long time ago on the right side"  . Left eye pain   . Localized osteoarthrosis of right hip 01/05/2019  . Prostate cancer [facility])     Patient Active Problem List   Diagnosis Date Noted  . Urinary retention 12/05/2019  . Hx of BKA, left ([place]) 12/05/2019  . PAD (peripheral artery disease) ([place]) 12/05/2019  . Decubitus ulcer of sacral region, stage 2 ([place]) 12/02/2019  . Hyperglycemia due to diabetes mellitus ([place]) 12/02/2019  . Leukocytosis 12/02/2019  . Thrombocytosis 12/02/2019  . Hypoalbuminemia 12/02/2019  . Prolonged QT interval 12/02/2019  . Typical atrial flutter ([place])   . Atrial fibrillation with RVR ([place])   . Acute osteomyelitis of left calcaneus ([name])   . Sepsis ([place]) 11/05/2019  . Chronic heel ulcer, left, limited to  breakdown of skin ([place]) 10/28/2019  . Acute osteomyelitis of calcaneum, right ([place]) 09/25/2019  . Critical lower limb ischemia ([place]) 08/11/2019  . Hx of total hip arthroplasty, right 08/06/2019  . Arthritis of right hip 06/12/2019  . Pelvic abscess in male [facility]) 09/11/2018  . Drug-induced hepatitis 06/05/2018  . Calculus of gallbladder without cholecystitis without obstruction   . Fungemia/Candida albicans 05/10/2018  . UTI (urinary tract infection) 05/08/2018  . Pseudomonas urinary tract infection 05/08/2018  . 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 ([place]) 04/21/2018  . Abscess of left thigh   . Pressure ulcer 02/10/2018  . Enterococcus faecalis infection 01/27/2018  . Abscess   . Psoas abscess ([place]) 01/23/2018  . Sepsis due to Bacteroides species ([place]) 01/22/2018  . Sepsis without acute organ dysfunction ([place])   . DKA (diabetic ketoacidoses) 01/17/2018  . Normocytic anemia 01/17/2018  . Chronic anemia 01/03/2018  . Chronic indwelling Foley catheter 01/03/2018  . Elevated alkaline phosphatase level 01/03/2018  . Hypothyroidism 01/03/2018  . Physical deconditioning 01/03/2018  . 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 HLD (hyperlipidemia) 08/19/2013  . Type 2 diabetes mellitus ([place])   . Prostate cancer [facility])     Past Surgical History:  Procedure Laterality Date  . ABDOMINAL AORTOGRAM W/LOWER EXTREMITY Bilateral 08/12/2019   Procedure: ABDOMINAL AORTOGRAM W/LOWER EXTREMITY;  Surgeon: Marty Heck, MD;  Location: [place] [facility];  Service: Cardiovascular;  Laterality: Bilateral;  . ABDOMINAL AORTOGRAM W/LOWER  EXTREMITY Left 08/19/2019   Procedure: ABDOMINAL AORTOGRAM W/LOWER EXTREMITY;  Surgeon: Marty Heck, MD;  Location: [place] [facility];  Service: Cardiovascular;  Laterality: Left;  . AMPUTATION Right 09/28/2019   Procedure: AMPUTATION BELOW KNEE;  Surgeon: Rosetta Posner, MD;  Location: [place];  Service: Vascular;  Laterality: Right;  .  AMPUTATION Left 11/11/2019   Procedure: AMPUTATION BELOW KNEE;  Surgeon: Rosetta Posner, MD;  Location: [facility];  Service: Vascular;  Laterality: Left;  . ESOPHAGOGASTRODUODENOSCOPY (EGD) WITH PROPOFOL N/A 11/09/2019   Procedure: ESOPHAGOGASTRODUODENOSCOPY (EGD) WITH PROPOFOL;  Surgeon: Lavena Bullion, DO;  Location: [place];  Service: Gastroenterology;  Laterality: N/A;  . HERNIA REPAIR    . IR RADIOLOGIST EVAL & MGMT  03/06/2018  . PERIPHERAL VASCULAR BALLOON ANGIOPLASTY Left 08/19/2019   Procedure: PERIPHERAL VASCULAR BALLOON ANGIOPLASTY;  Surgeon: Marty Heck, MD;  Location: [place] [facility];  Service: Cardiovascular;  Laterality: Left;  . PROSTATE SURGERY    . TOTAL HIP ARTHROPLASTY Right 06/12/2019   Procedure: RIGHT TOTAL HIP ARTHROPLASTY DIRECT ANTERIOR;  Surgeon: Marybelle Killings, MD;  Location: [place];  Service: Orthopedics;  Laterality: Right;       Family History  Problem Relation Age of Onset  . Pneumonia Father   . Cancer Sister     Social History   Tobacco Use  . Smoking status: Never Smoker  . Smokeless tobacco: Never Used  Vaping Use  . Vaping Use: Never used  Substance Use Topics  . Alcohol use: No  . Drug use: No    Home Medications Prior to Admission medications   Medication Sig Start Date End Date Taking? Authorizing Provider  acetaminophen (TYLENOL) 500 MG tablet Take 1,000 mg by mouth every 6 (six) hours as needed for mild pain or moderate pain.    [provider]  ALPRAZolam Duanne Moron) 0.5 MG tablet Take 1 tablet (0.5 mg total) by mouth 2 (two) times daily as needed for anxiety. 09/30/19   Geradine Girt, DO  amiodarone (PACERONE) 200 MG tablet Take 1 tablet (200 mg total) by mouth 2 (two) times daily for 14 days. Amiodarone 200mg  BID for 14 days and then once daily. 11/26/19 12/10/19  Bunnie Pion Z, DO  amitriptyline (ELAVIL) 10 MG tablet Take 20 mg by mouth at bedtime.  11/28/18   [provider]  apixaban (ELIQUIS) 5 MG TABS  tablet Take 1 tablet (5 mg total) by mouth 2 (two) times daily. 11/15/19   Antonieta Pert, MD  clopidogrel (PLAVIX) 75 MG tablet Take 1 tablet (75 mg total) by mouth daily. 08/19/19 08/18/20  Marty Heck, MD  colchicine 0.6 MG tablet Take 0.6 mg by mouth daily as needed (gout attacks).  07/27/19   [provider]  diltiazem (CARDIZEM CD) 300 MG 24 hr capsule Take 1 capsule (300 mg total) by mouth daily. 11/27/19   Bunnie Pion Z, DO  ferrous sulfate 325 (65 FE) MG tablet Take 1 tablet (325 mg total) by mouth 2 (two) times daily with a meal. 09/30/19   Vann, Jessica U, DO  insulin aspart (NOVOLOG) 100 UNIT/ML injection Inject 0-15 Units into the skin 3 (three) times daily with meals. Sliding scale insulin Less than 70 initiate hypoglycemia protocol 70-120  0 units 120-150 2 unit 151-200 3 units 201-250 3 units 251-300 5 units 301-350 8 units 351-400 11 units  Greater than 400 15 units , call MD Patient taking differently: Inject 0-16 Units into the skin See admin instructions.  Inject 0-16 units subcutaneously three times daily with meals per sliding scale: CBG 70-120 0 units 120-150 4 units, 151-200 5 units, 201-250 6 units, 251-300 7 units, 301-350 10 units, 351-400 13 units, >400 16 units and call MD (Less than 70 initiate hypoglycemia protocol) 02/18/18   Oswald Hillock, MD  Insulin Glargine (BASAGLAR KWIKPEN) 100 UNIT/ML Inject 12 Units into the skin daily. 12/06/19   Dwyane Dee, MD  levothyroxine (SYNTHROID) 100 MCG tablet Take 1 tablet (100 mcg total) by mouth daily with breakfast. 12/07/19   Dwyane Dee, MD  metoprolol succinate (TOPROL-XL) 100 MG 24 hr tablet Take 1 tablet (100 mg total) by mouth daily. Take with or immediately following a meal. 11/27/19   Bunnie Pion Z, DO  Multiple Vitamin (MULTIVITAMIN WITH MINERALS) TABS tablet Take 1 tablet by mouth daily. 09/30/19   Geradine Girt, DO  nutrition supplement, JUVEN, (JUVEN) PACK Take 1 packet by mouth 2 (two) times  daily between meals. 09/30/19   Geradine Girt, DO  nystatin (MYCOSTATIN/NYSTOP) powder Apply 1 application topically daily as needed (yeast). 12/06/19   Dwyane Dee, MD  oxybutynin (DITROPAN-XL) 5 MG 24 hr tablet Take 1 tablet (5 mg total) by mouth at bedtime. 11/13/19   Antonieta Pert, MD  pantoprazole (PROTONIX) 40 MG tablet Take 1 tablet (40 mg total) by mouth daily. 09/30/19   Geradine Girt, DO  pravastatin (PRAVACHOL) 10 MG tablet Take 10 mg by mouth daily with supper.     [provider]  senna-docusate (SENOKOT-S) 8.6-50 MG tablet Take 1 tablet by mouth 2 (two) times daily. Patient taking differently: Take 1 tablet by mouth 2 (two) times daily as needed for mild constipation.  09/30/19   Geradine Girt, DO  silver sulfADIAZINE (SILVADENE) 1 % cream Apply topically 2 (two) times daily. 09/30/19   Geradine Girt, DO  tobramycin (TOBREX) 0.3 % ophthalmic solution Place 1 drop into both eyes in the morning and at bedtime. 12/06/19   Dwyane Dee, MD    Allergies    Fluconazole  Review of Systems   Review of Systems  Constitutional: Negative for chills, fatigue and fever.  Genitourinary: Positive for hematuria. Negative for decreased urine volume, penile pain, penile swelling, scrotal swelling, testicular pain and urgency.  Neurological: Negative for weakness.    Physical Exam Updated Vital Signs BP 126/68 (BP Location: Left Arm)   Pulse 71   Temp 97.7 F (36.5 C) (Oral)   Resp 18   Ht 5\' 8"  (1.727 m)   Wt 77.1 kg   SpO2 100%   BMI 25.85 kg/m   Physical Exam Vitals and nursing note reviewed.  Constitutional:      General: He is not in acute distress.    Appearance: He is well-developed. He is not ill-appearing, toxic-appearing or diaphoretic.  HENT:     Head: Normocephalic and atraumatic.     Mouth/Throat:     Mouth: Mucous membranes are moist.     Pharynx: Oropharynx is clear.  Eyes:     Conjunctiva/sclera: Conjunctivae normal.  Cardiovascular:     Rate  and Rhythm: Normal rate and regular rhythm.     Pulses: Normal pulses.     Heart sounds: Normal heart sounds. No murmur heard.   Pulmonary:     Effort: Pulmonary effort is normal. No respiratory distress.     Breath sounds: Normal breath sounds.  Abdominal:     General: Abdomen is flat.     Palpations: Abdomen is soft.  Tenderness: There is no abdominal tenderness.  Genitourinary:    Penis: Normal.      Testes: Normal.     Prostate: Normal.     Comments: Pt with visible blood surrounding urethral meatus. Blood in follow. Uncircumcised, no visible erythema, swelling, drainage, non-tender scrotum  Musculoskeletal:        General: No tenderness or deformity. Normal range of motion.     Cervical back: Neck supple.  Skin:    General: Skin is warm and dry.     Findings: No bruising or erythema.  Neurological:     Mental Status: He is alert.      ED Results / Procedures / Treatments   Labs (all labs ordered are listed, but only abnormal results are displayed) Labs Reviewed  CBC - Abnormal; Notable for the following components:      Result Value   WBC 10.8 (*)    RBC 3.03 (*)    Hemoglobin 7.6 (*)    HCT 25.6 (*)    MCH 25.1 (*)    MCHC 29.7 (*)    RDW 17.7 (*)    Platelets 421 (*)    All other components within normal limits  URINE CULTURE  URINALYSIS, ROUTINE W REFLEX MICROSCOPIC    EKG None  Radiology No results found.  Procedures Procedures (including critical care time)  Medications Ordered in ED Medications - No data to display  ED Course  I have reviewed the triage vital signs and the nursing notes.  Pertinent labs & imaging results that were available during my care of the patient were reviewed by me and considered in my medical decision making (see chart for details).    MDM Rules/Calculators/A&P                         86 year old male arrives from home with bleeding in his Foley x1 day.  Visible bleeding around the urethral meatus and in the  Foley, however no visible strictures, erythema, drainage, swelling, no signs of infection.  Patient is asymptomatic, with no complaints of abdominal pain, flank pain, no fevers or chills.  Low concern for sepsis.  Patient just finished a course of Rocephin in the setting of a UTI.  Repeated UA and culture here.  CBC with approximately 1 point drop in hemoglobin to 7.2.  Contacted Dr. Alyson Ingles with urology to expedite follow-up in the office.  He will make an appointment for the patient and contact him.  Overall work-up reassuring.  Spoke with the patient's daughter who is aware and agreeable to the plan.  Return precautions discussed.  At this stage in the ED course, the patient is medically screened and stable for discharge.  Patient was seen and evaluated by Dr. Sabra Heck who is agreeable to the above plan and disposition  Final Clinical Impression(s) / ED Diagnoses Final diagnoses:  Hematuria, unspecified type    Rx / DC Orders ED Discharge Orders    None       Lyndel Safe 12/14/19 1559    Noemi Chapel, MD 12/15/19 [phone]

## 2019-12-14 NOTE — ED Triage Notes (Signed)
Pt from home brought in by ems for c/o blood in foley bag; pt denies any pain and states he noticed bloody urine last night before going to bed

## 2019-12-15 ENCOUNTER — Telehealth: Payer: Self-pay

## 2019-12-15 NOTE — Telephone Encounter (Signed)
I received a call from [employer] at [employer] Said drt had called them and said pt kept bleeding from cath and had been to ER 2x recently. I talked with Gibson Ramp RN and we moved pt appt up to tomorrow. I called and spoke with his caregiver Rosine Beat. Told him about appt change and if pts condition changed before to take him back to ER. He expressed understanding.

## 2019-12-16 ENCOUNTER — Encounter: Payer: Self-pay | Admitting: Urology

## 2019-12-16 ENCOUNTER — Other Ambulatory Visit: Payer: Self-pay

## 2019-12-16 ENCOUNTER — Ambulatory Visit (INDEPENDENT_AMBULATORY_CARE_PROVIDER_SITE_OTHER): Payer: Medicare Other | Admitting: Urology

## 2019-12-16 VITALS — BP 99/62 | HR 78 | Temp 98.5°F | Ht 68.0 in | Wt 170.0 lb

## 2019-12-16 DIAGNOSIS — R310 Gross hematuria: Secondary | ICD-10-CM

## 2019-12-16 LAB — URINE CULTURE: Culture: >=100000 — AB

## 2019-12-16 MED ORDER — CEFPODOXIME PROXETIL 200 MG PO TABS: 200.0000 mg | ORAL_TABLET | Freq: Two times a day (BID) | ORAL | 0 refills | Status: DC

## 2019-12-16 NOTE — Progress Notes (Addendum)
12/16/2019 10:19 AM   Anthony Dunlap 1933/09/24 [id number]  Referring provider: [name], MD [address] [place],  [place] [address]  Gross hematuria  HPI: Anthony Dunlap is a 86yo here for evaluation of gross hematuria. Starting Sept 16th he developed gross hematuria and has been seen twice in the past month for gross hematuria and malfunctioning foley. He required irritation twice. He was given rx for keflex which he could not take and then cipro by Dr. Willey Blade . He has an indwelling foley due to a neurogenic bladder and hx of urethral strictures. His urine is currently bloody. He is currently on eliquis and plavix.   His records from [facility] are as follows: I have a urethral stricture.  HPI: Anthony Dunlap is a 86 year-old male established patient who is here for a urethral stricture.  He does have a history of urethral strictures. He does have to strain or bear down to start his urinary stream. He does not have a split stream when he urinates. He does not have a good size and strength to his urinary stream. His urinary stream does start and stop during voiding. He does not have frequency. He is having problems with emptying his bladder well.   He has not had a history of trauma to the urethra. He has received radiation therapy. His symptoms have gotten worse over the last year.   He has previously had an indwelling catheter in for more than two weeks at a time.   12/11/16  The patient has a history of prostate cancer Gleason 7. This was treated with radiation seeds in 2009. His last PSA was January was undetectable. He has had a TURP in the past. He has a history urethral stricture. This was dilated back in early December by Dr.Gaspard Isbell and he passed a voiding trial a week later with several weeks later he had a recurrence of stricture that required dilation by me. He has had the catheter for about 3 weeks now.   01/22/17  Since the last visit, the patient went into urinary retention  and that they have a catheter placed. In addition to this, he has paraphimosis for the past 3 weeks since his catheter placement. He has not having any symptoms from this.   04/12/17  Patient returns after undergoing urodynamics. He has done well post circumcision for paraphimosis. Past cystoscopy revealed a wide open bladder neck and non-obstructing prostate. There was no significant urethral stricture. Urodynamics showed a small involuntary contraction but no obvious voluntary contraction during permission to void. He has a small capacity bladder as well. He has + SUI. He is currently doing OK with a catheter. Unfortunately each time he undergoes a void trial he goes back into retention soon thereafter.     PMH: Past Medical History:  Diagnosis Date  . Chronic heel ulcer, left, limited to breakdown of skin ([place]) 10/28/2019  . 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 ([place]) 04/21/2018  . Diabetes ([place])   . Drug-induced hepatitis 06/05/2018  . Inguinal hernia    06/09/2019: per patient " a long time ago on the right side"  . Left eye pain   . Localized osteoarthrosis of right hip 01/05/2019  . Prostate cancer [facility])     Surgical History: Past Surgical History:  Procedure Laterality Date  . ABDOMINAL AORTOGRAM W/LOWER EXTREMITY Bilateral 08/12/2019   Procedure: ABDOMINAL AORTOGRAM W/LOWER EXTREMITY;  Surgeon: Marty Heck, MD;  Location: [facility];  Service: Cardiovascular;  Laterality:  Bilateral;  . ABDOMINAL AORTOGRAM W/LOWER EXTREMITY Left 08/19/2019   Procedure: ABDOMINAL AORTOGRAM W/LOWER EXTREMITY;  Surgeon: Marty Heck, MD;  Location: [facility];  Service: Cardiovascular;  Laterality: Left;  . AMPUTATION Right 09/28/2019   Procedure: AMPUTATION BELOW KNEE;  Surgeon: Rosetta Posner, MD;  Location: [facility];  Service: Vascular;  Laterality: Right;  . AMPUTATION Left 11/11/2019   Procedure: AMPUTATION BELOW KNEE;  Surgeon: Rosetta Posner, MD;  [facility];  Service: Vascular;  Laterality: Left;  . ESOPHAGOGASTRODUODENOSCOPY (EGD) WITH PROPOFOL N/A 11/09/2019   Procedure: ESOPHAGOGASTRODUODENOSCOPY (EGD) WITH PROPOFOL;  Surgeon: Lavena Bullion, DO;  Location: [facility];  Service: Gastroenterology;  Laterality: N/A;  . HERNIA REPAIR    . IR RADIOLOGIST EVAL & MGMT  03/06/2018  . PERIPHERAL VASCULAR BALLOON ANGIOPLASTY Left 08/19/2019   Procedure: PERIPHERAL VASCULAR BALLOON ANGIOPLASTY;  Surgeon: Marty Heck, MD;  Location: [facility];  Service: Cardiovascular;  Laterality: Left;  . PROSTATE SURGERY    . TOTAL HIP ARTHROPLASTY Right 06/12/2019   Procedure: RIGHT TOTAL HIP ARTHROPLASTY DIRECT ANTERIOR;  Surgeon: Marybelle Killings, MD;  Location: [facility];  Service: Orthopedics;  Laterality: Right;    Home Medications:  Allergies as of 12/16/2019      Reactions   Fluconazole Other (See Comments)   Drug-induced hepatitis      Medication List       Accurate as of December 16, 2019 10:19 AM. If you have any questions, ask your nurse or doctor.        STOP taking these medications   diltiazem 300 MG 24 hr capsule Commonly known as: CARDIZEM CD Stopped by: Nicolette Bang, MD     TAKE these medications   acetaminophen 500 MG tablet Commonly known as: TYLENOL Take 1,000 mg by mouth every 6 (six) hours as needed for mild pain or moderate pain.   ALPRAZolam 0.5 MG tablet Commonly known as: XANAX Take 1 tablet (0.5 mg total) by mouth 2 (two) times daily as needed for anxiety.   amiodarone 200 MG tablet Commonly known as: PACERONE Take 1 tablet (200 mg total) by mouth 2 (two) times daily for 14 days. Amiodarone 200mg  BID for 14 days and then once daily.   amitriptyline 10 MG tablet Commonly known as: ELAVIL Take 20 mg by mouth at bedtime.   apixaban 5 MG Tabs tablet Commonly known as: ELIQUIS Take 1 tablet (5 mg total) by mouth 2 (two) times daily.   Basaglar KwikPen 100 UNIT/ML Inject 12 Units into the skin  daily.   cephALEXin 500 MG capsule Commonly known as: KEFLEX SMARTSIG:1 Capsule(s) By Mouth 4-5 Times Daily   ciprofloxacin 500 MG tablet Commonly known as: CIPRO Take 500 mg by mouth 2 (two) times daily.   clopidogrel 75 MG tablet Commonly known as: Plavix Take 1 tablet (75 mg total) by mouth daily.   colchicine 0.6 MG tablet Take 0.6 mg by mouth daily as needed (gout attacks).   ferrous sulfate 325 (65 FE) MG tablet Take 1 tablet (325 mg total) by mouth 2 (two) times daily with a meal.   insulin aspart 100 UNIT/ML injection Commonly known as: novoLOG Inject 0-15 Units into the skin 3 (three) times daily with meals. Sliding scale insulin Less than 70 initiate hypoglycemia protocol 70-120  0 units 120-150 2 unit 151-200 3 units 201-250 3 units 251-300 5 units 301-350 8 units 351-400 11 units  Greater than 400 15 units , call MD What  changed:   how much to take  when to take this  additional instructions   levothyroxine 100 MCG tablet Commonly known as: SYNTHROID Take 1 tablet (100 mcg total) by mouth daily with breakfast.   metoprolol succinate 100 MG 24 hr tablet Commonly known as: TOPROL-XL Take 1 tablet (100 mg total) by mouth daily. Take with or immediately following a meal.   multivitamin with minerals Tabs tablet Take 1 tablet by mouth daily.   nutrition supplement (JUVEN) Pack Take 1 packet by mouth 2 (two) times daily between meals.   nystatin powder Commonly known as: MYCOSTATIN/NYSTOP Apply 1 application topically daily as needed (yeast).   oxybutynin 5 MG 24 hr tablet Commonly known as: DITROPAN-XL Take 1 tablet (5 mg total) by mouth at bedtime.   pantoprazole 40 MG tablet Commonly known as: PROTONIX Take 1 tablet (40 mg total) by mouth daily.   pravastatin 10 MG tablet Commonly known as: PRAVACHOL Take 10 mg by mouth daily with supper.   senna-docusate 8.6-50 MG tablet Commonly known as: Senokot-S Take 1 tablet by mouth 2 (two) times  daily. What changed:   when to take this  reasons to take this   silver sulfADIAZINE 1 % cream Commonly known as: SILVADENE Apply topically 2 (two) times daily.   tamsulosin 0.4 MG Caps capsule Commonly known as: FLOMAX Take 0.4 mg by mouth daily.   tobramycin 0.3 % ophthalmic solution Commonly known as: TOBREX Place 1 drop into both eyes in the morning and at bedtime.       Allergies:  Allergies  Allergen Reactions  . Fluconazole Other (See Comments)    Drug-induced hepatitis    Family History: Family History  Problem Relation Age of Onset  . Pneumonia Father   . Cancer Sister     Social History:  reports that he has never smoked. He has never used smokeless tobacco. He reports that he does not drink alcohol and does not use drugs.  ROS: All other review of systems were reviewed and are negative except what is noted above in HPI  Physical Exam: BP 99/62   Pulse 78   Temp 98.5 F (36.9 C)   Ht 5\' 8"  (1.727 m)   Wt 170 lb (77.1 kg)   BMI 25.85 kg/m   Constitutional:  Alert and oriented, No acute distress. HEENT: [place] AT, moist mucus membranes.  Trachea midline, no masses. Cardiovascular: No clubbing, cyanosis, or edema. Respiratory: Normal respiratory effort, no increased work of breathing. GI: Abdomen is soft, nontender, nondistended, no abdominal masses GU: No CVA tenderness. Lymph: No cervical or inguinal lymphadenopathy. Skin: No rashes, bruises or suspicious lesions. Neurologic: Grossly intact, no focal deficits, moving all 4 extremities. Psychiatric: Normal mood and affect.  Laboratory Data: Lab Results  Component Value Date   WBC 10.8 (H) 12/14/2019   HGB 7.6 (L) 12/14/2019   HCT 25.6 (L) 12/14/2019   MCV 84.5 12/14/2019   PLT 421 (H) 12/14/2019    Lab Results  Component Value Date   CREATININE 0.90 12/06/2019    No results found for: PSA  No results found for: TESTOSTERONE  Lab Results  Component Value Date   HGBA1C 8.5 (H)  11/05/2019    Urinalysis    Component Value Date/Time   COLORURINE RED (A) 12/14/2019 1354   APPEARANCEUR CLOUDY (A) 12/14/2019 1354   LABSPEC  12/14/2019 1354    TEST NOT REPORTED DUE TO COLOR INTERFERENCE OF URINE PIGMENT   PHURINE  12/14/2019 1354  TEST NOT REPORTED DUE TO COLOR INTERFERENCE OF URINE PIGMENT   GLUCOSEU (A) 12/14/2019 1354    TEST NOT REPORTED DUE TO COLOR INTERFERENCE OF URINE PIGMENT   HGBUR (A) 12/14/2019 1354    TEST NOT REPORTED DUE TO COLOR INTERFERENCE OF URINE PIGMENT   BILIRUBINUR (A) 12/14/2019 1354    TEST NOT REPORTED DUE TO COLOR INTERFERENCE OF URINE PIGMENT   KETONESUR (A) 12/14/2019 1354    TEST NOT REPORTED DUE TO COLOR INTERFERENCE OF URINE PIGMENT   PROTEINUR (A) 12/14/2019 1354    TEST NOT REPORTED DUE TO COLOR INTERFERENCE OF URINE PIGMENT   UROBILINOGEN 0.2 09/03/2013 0132   NITRITE (A) 12/14/2019 1354    TEST NOT REPORTED DUE TO COLOR INTERFERENCE OF URINE PIGMENT   LEUKOCYTESUR (A) 12/14/2019 1354    TEST NOT REPORTED DUE TO COLOR INTERFERENCE OF URINE PIGMENT    Lab Results  Component Value Date   BACTERIA FEW (A) 12/14/2019    Pertinent Imaging: CT 11/20/2019: Images reviewed and discuss with the patient Results for orders placed during the hospital encounter of 11/05/19  DG Abd 1 View  Narrative CLINICAL DATA:  Follow-up for abdominal distension  EXAM: ABDOMEN - 1 VIEW  COMPARISON:  None.  FINDINGS: Gaseous distension of the small bowel and colon. Moderate amount of stool throughout the colon. No evidence of pneumoperitoneum, portal venous gas or pneumatosis.  No pathologic calcifications along the expected course of the ureters.  No acute osseous abnormality.  IMPRESSION: Gaseous distension of the small bowel and colon as can be seen with an ileus.   Electronically Signed By: Kathreen Devoid On: 11/08/2019 15:47  No results found for this or any previous visit.  No results found for this or any previous  visit.  No results found for this or any previous visit.  No results found for this or any previous visit.  No results found for this or any previous visit.  No results found for this or any previous visit.  No results found for this or any previous visit.   Assessment & Plan:    1. Gross hematuria -Likely related to UTI. We will start vantin 200mg  BID for 7 days. RTC 1 week. If hematuria has not resolved we will proceed with cystoscopy.   No follow-ups on file.  Nicolette Bang, MD  [facility] [place]

## 2019-12-16 NOTE — Progress Notes (Signed)
Urological Symptom Review  Patient is experiencing the following symptoms: Blood in urine Urinary tract infection    Review of Systems  Gastrointestinal (upper)  : Negative for upper GI symptoms  Gastrointestinal (lower) : Constipation  Constitutional : Negative for symptoms  Skin: Negative for skin symptoms  Eyes: Negative for eye symptoms  Ear/Nose/Throat : Negative for Ear/Nose/Throat symptoms  Hematologic/Lymphatic: Negative for Hematologic/Lymphatic symptoms  Cardiovascular : Negative for cardiovascular symptoms  Respiratory : Negative for respiratory symptoms  Endocrine: Negative for endocrine symptoms  Musculoskeletal: Negative for musculoskeletal symptoms  Neurological: Negative for neurological symptoms  Psychologic: Anxiety

## 2019-12-16 NOTE — Patient Instructions (Signed)
Hematuria, Adult Hematuria is blood in the urine. Blood may be visible in the urine, or it may be identified with a test. This condition can be caused by infections of the bladder, urethra, kidney, or prostate. Other possible causes include:  Kidney stones.  Cancer of the urinary tract.  Too much calcium in the urine.  Conditions that are passed from parent to child (inherited conditions).  Exercise that requires a lot of energy. Infections can usually be treated with medicine, and a kidney stone usually will pass through your urine. If neither of these is the cause of your hematuria, more tests may be needed to identify the cause of your symptoms. It is very important to tell your health care provider about any blood in your urine, even if it is painless or the blood stops without treatment. Blood in the urine, when it happens and then stops and then happens again, can be a symptom of a very serious condition, including cancer. There is no pain in the initial stages of many urinary cancers. Follow these instructions at home: Medicines  Take over-the-counter and prescription medicines only as told by your health care provider.  If you were prescribed an antibiotic medicine, take it as told by your health care provider. Do not stop taking the antibiotic even if you start to feel better. Eating and drinking  Drink enough fluid to keep your urine clear or pale yellow. It is recommended that you drink 3-4 quarts (2.8-3.8 L) a day. If you have been diagnosed with an infection, it is recommended that you drink cranberry juice in addition to large amounts of water.  Avoid caffeine, tea, and carbonated beverages. These tend to irritate the bladder.  Avoid alcohol because it may irritate the prostate (men). General instructions  If you have been diagnosed with a kidney stone, follow your health care provider's instructions about straining your urine to catch the stone.  Empty your bladder  often. Avoid holding urine for long periods of time.  If you are male: ? After a bowel movement, wipe from front to back and use each piece of toilet paper only once. ? Empty your bladder before and after sex.  Pay attention to any changes in your symptoms. Tell your health care provider about any changes or any new symptoms.  It is your responsibility to get your test results. Ask your health care provider, or the department performing the test, when your results will be ready.  Keep all follow-up visits as told by your health care provider. This is important. Contact a health care provider if:  You develop back pain.  You have a fever.  You have nausea or vomiting.  Your symptoms do not improve after 3 days.  Your symptoms get worse. Get help right away if:  You develop severe vomiting and are unable take medicine without vomiting.  You develop severe pain in your back or abdomen even though you are taking medicine.  You pass a large amount of blood in your urine.  You pass blood clots in your urine.  You feel very weak or like you might faint.  You faint. Summary  Hematuria is blood in the urine. It has many possible causes.  It is very important that you tell your health care provider about any blood in your urine, even if it is painless or the blood stops without treatment.  Take over-the-counter and prescription medicines only as told by your health care provider.  Drink enough fluid to keep   your urine clear or pale yellow. This information is not intended to replace advice given to you by your health care provider. Make sure you discuss any questions you have with your health care provider. Document Revised: 07/02/2018 Document Reviewed: 03/10/2016 Elsevier Patient Education  2020 Elsevier Inc[employer]  

## 2019-12-17 ENCOUNTER — Ambulatory Visit (INDEPENDENT_AMBULATORY_CARE_PROVIDER_SITE_OTHER): Payer: Medicare Other | Admitting: Physician Assistant

## 2019-12-17 ENCOUNTER — Encounter: Payer: Self-pay | Admitting: Physician Assistant

## 2019-12-17 VITALS — BP 88/54 | HR 86 | Temp 98.5°F | Resp 20 | Ht 68.0 in | Wt 165.0 lb

## 2019-12-17 DIAGNOSIS — Z89511 Acquired absence of right leg below knee: Secondary | ICD-10-CM

## 2019-12-17 DIAGNOSIS — Z89512 Acquired absence of left leg below knee: Secondary | ICD-10-CM

## 2019-12-17 NOTE — Progress Notes (Signed)
POST OPERATIVE OFFICE NOTE    CC:  F/u for surgery  HPI:  This is a 86 y.o. male who is s/p left below knee amputation by Dr. Donnetta Hutching on 11/11/19 for gangrene of left foot. He unfortunately had several attempts at endovascular revascularization of his left lower extremity but ultimately his non healing heel wound progressed and amputation was recommended. He is also s/p right BKA on 09/28/19 by Dr. Donnetta Hutching. He reports no significant pain in either BKA. He has hit the right BKA several times so has had few episodes of bleeding. The staples from the left BKA were removed at the hospital. He has scabs that are slowly healing on both BKA's. Denies any purulent drainage. No fever or chills. He otherwise has no other complaints. Asking when he can start using prosthesis.  Caregiver explains that he has been having a lot of issues with recurrent UTI's. He has permanent foley catheter. Started having blood in urine several days ago. Is currently being treated with Antibiotics for this per Urology  Allergies  Allergen Reactions  . Fluconazole Other (See Comments)    Drug-induced hepatitis    Current Outpatient Medications  Medication Sig Dispense Refill  . acetaminophen (TYLENOL) 500 MG tablet Take 1,000 mg by mouth every 6 (six) hours as needed for mild pain or moderate pain.    [name] ALPRAZolam (XANAX) 0.5 MG tablet Take 1 tablet (0.5 mg total) by mouth 2 (two) times daily as needed for anxiety. 4 tablet 0  . amitriptyline (ELAVIL) 10 MG tablet Take 20 mg by mouth at bedtime.     [name] apixaban (ELIQUIS) 5 MG TABS tablet Take 1 tablet (5 mg total) by mouth 2 (two) times daily. 60 tablet 0  . cefpodoxime (VANTIN) 200 MG tablet Take 1 tablet (200 mg total) by mouth 2 (two) times daily. 14 tablet 0  . clopidogrel (PLAVIX) 75 MG tablet Take 1 tablet (75 mg total) by mouth daily. 30 tablet 11  . colchicine 0.6 MG tablet Take 0.6 mg by mouth daily as needed (gout attacks).     . ferrous sulfate 325 (65 FE) MG tablet  Take 1 tablet (325 mg total) by mouth 2 (two) times daily with a meal.  3  . insulin aspart (NOVOLOG) 100 UNIT/ML injection Inject 0-15 Units into the skin 3 (three) times daily with meals. Sliding scale insulin Less than 70 initiate hypoglycemia protocol 70-120  0 units 120-150 2 unit 151-200 3 units 201-250 3 units 251-300 5 units 301-350 8 units 351-400 11 units  Greater than 400 15 units , call MD 10 mL 11  . levothyroxine (SYNTHROID) 100 MCG tablet Take 1 tablet (100 mcg total) by mouth daily with breakfast. 30 tablet 3  . metoprolol succinate (TOPROL-XL) 100 MG 24 hr tablet Take 1 tablet (100 mg total) by mouth daily. Take with or immediately following a meal. 30 tablet 0  . Multiple Vitamin (MULTIVITAMIN WITH MINERALS) TABS tablet Take 1 tablet by mouth daily.    . nutrition supplement, JUVEN, (JUVEN) PACK Take 1 packet by mouth 2 (two) times daily between meals.  0  . nystatin (MYCOSTATIN/NYSTOP) powder Apply 1 application topically daily as needed (yeast).    . pantoprazole (PROTONIX) 40 MG tablet Take 1 tablet (40 mg total) by mouth daily.    . pravastatin (PRAVACHOL) 10 MG tablet Take 10 mg by mouth daily with supper.     . senna-docusate (SENOKOT-S) 8.6-50 MG tablet Take 1 tablet by mouth 2 (two) times daily. (  Patient taking differently: Take 1 tablet by mouth 2 (two) times daily as needed for mild constipation. )    . silver sulfADIAZINE (SILVADENE) 1 % cream Apply topically 2 (two) times daily. 50 g 0  . SOLIQUA 100-33 UNT-MCG/ML SOPN Inject into the skin.    . tamsulosin (FLOMAX) 0.4 MG CAPS capsule Take 0.4 mg by mouth daily.    [name] tobramycin (TOBREX) 0.3 % ophthalmic solution Place 1 drop into both eyes in the morning and at bedtime.    [name] amiodarone (PACERONE) 200 MG tablet Take 1 tablet (200 mg total) by mouth 2 (two) times daily for 14 days. Amiodarone 200mg  BID for 14 days and then once daily. 42 tablet 0   No current facility-administered medications for this visit.      ROS:  See HPI  Physical Exam:  Vitals:   12/17/19 1100  BP: (!) 88/54  Pulse: 86  Resp: 20  Temp: 98.5 F (36.9 C)  TempSrc: Temporal  SpO2: 100%  Weight: 165 lb (74.8 kg)  Height: 5\' 8"  (1.727 m)    Incision:  Bilateral BKA with dry eschars. Flaps appear viable. Some mild erythema present  Right BKA site  Left BKA site  Extremities:  2+ femoral pulses bilaterally Neuro: alert and oriented   Assessment/Plan:  This is a 86 y.o. male who is s/p bilateral below knee amputations. Has dry eschars on both incision lines but flaps appear viable. Continue to clean with mild soap and water. Can leave open to air. Keep stumps protected. Instructed patient and caregiver to observe for any signs of infection  - Patient will follow up in 4-6 weeks for wound check    Karoline Caldwell, PA-C [facility] [phone]  Clinic MD:  Dr. Oneida Alar

## 2019-12-18 ENCOUNTER — Ambulatory Visit: Payer: Medicare Other | Admitting: Urology

## 2019-12-22 ENCOUNTER — Encounter (HOSPITAL_COMMUNITY): Payer: Self-pay | Admitting: *Deleted

## 2019-12-22 ENCOUNTER — Inpatient Hospital Stay (HOSPITAL_COMMUNITY)
Admission: EM | Admit: 2019-12-22 | Discharge: 2019-12-25 | DRG: 699 | Disposition: A | Discharge status: Home Health | Payer: Medicare Other | Attending: Internal Medicine | Admitting: Internal Medicine

## 2019-12-22 DIAGNOSIS — Z794 Long term (current) use of insulin: Secondary | ICD-10-CM

## 2019-12-22 DIAGNOSIS — B3749 Other urogenital candidiasis: Secondary | ICD-10-CM | POA: Diagnosis present

## 2019-12-22 DIAGNOSIS — Z7902 Long term (current) use of antithrombotics/antiplatelets: Secondary | ICD-10-CM

## 2019-12-22 DIAGNOSIS — Z20822 Contact with and (suspected) exposure to covid-19: Secondary | ICD-10-CM | POA: Diagnosis present

## 2019-12-22 DIAGNOSIS — I483 Typical atrial flutter: Secondary | ICD-10-CM | POA: Diagnosis present

## 2019-12-22 DIAGNOSIS — Z96641 Presence of right artificial hip joint: Secondary | ICD-10-CM | POA: Diagnosis present

## 2019-12-22 DIAGNOSIS — Z79890 Hormone replacement therapy (postmenopausal): Secondary | ICD-10-CM

## 2019-12-22 DIAGNOSIS — Y738 Miscellaneous gastroenterology and urology devices associated with adverse incidents, not elsewhere classified: Secondary | ICD-10-CM | POA: Diagnosis present

## 2019-12-22 DIAGNOSIS — R5381 Other malaise: Secondary | ICD-10-CM | POA: Diagnosis present

## 2019-12-22 DIAGNOSIS — R319 Hematuria, unspecified: Secondary | ICD-10-CM | POA: Diagnosis present

## 2019-12-22 DIAGNOSIS — E1151 Type 2 diabetes mellitus with diabetic peripheral angiopathy without gangrene: Secondary | ICD-10-CM | POA: Diagnosis present

## 2019-12-22 DIAGNOSIS — I482 Chronic atrial fibrillation, unspecified: Secondary | ICD-10-CM | POA: Diagnosis present

## 2019-12-22 DIAGNOSIS — Z89512 Acquired absence of left leg below knee: Secondary | ICD-10-CM

## 2019-12-22 DIAGNOSIS — E8809 Other disorders of plasma-protein metabolism, not elsewhere classified: Secondary | ICD-10-CM | POA: Diagnosis present

## 2019-12-22 DIAGNOSIS — H4922 Sixth [abducent] nerve palsy, left eye: Secondary | ICD-10-CM | POA: Diagnosis present

## 2019-12-22 DIAGNOSIS — Z79899 Other long term (current) drug therapy: Secondary | ICD-10-CM

## 2019-12-22 DIAGNOSIS — E785 Hyperlipidemia, unspecified: Secondary | ICD-10-CM | POA: Diagnosis present

## 2019-12-22 DIAGNOSIS — N390 Urinary tract infection, site not specified: Secondary | ICD-10-CM | POA: Diagnosis present

## 2019-12-22 DIAGNOSIS — L89152 Pressure ulcer of sacral region, stage 2: Secondary | ICD-10-CM | POA: Diagnosis present

## 2019-12-22 DIAGNOSIS — R54 Age-related physical debility: Secondary | ICD-10-CM | POA: Diagnosis present

## 2019-12-22 DIAGNOSIS — Y846 Urinary catheterization as the cause of abnormal reaction of the patient, or of later complication, without mention of misadventure at the time of the procedure: Secondary | ICD-10-CM | POA: Diagnosis present

## 2019-12-22 DIAGNOSIS — N319 Neuromuscular dysfunction of bladder, unspecified: Secondary | ICD-10-CM | POA: Diagnosis present

## 2019-12-22 DIAGNOSIS — E039 Hypothyroidism, unspecified: Secondary | ICD-10-CM | POA: Diagnosis present

## 2019-12-22 DIAGNOSIS — Z89511 Acquired absence of right leg below knee: Secondary | ICD-10-CM

## 2019-12-22 DIAGNOSIS — N35911 Unspecified urethral stricture, male, meatal: Secondary | ICD-10-CM

## 2019-12-22 DIAGNOSIS — E782 Mixed hyperlipidemia: Secondary | ICD-10-CM

## 2019-12-22 DIAGNOSIS — R310 Gross hematuria: Principal | ICD-10-CM | POA: Diagnosis present

## 2019-12-22 DIAGNOSIS — M1611 Unilateral primary osteoarthritis, right hip: Secondary | ICD-10-CM | POA: Diagnosis present

## 2019-12-22 DIAGNOSIS — N35919 Unspecified urethral stricture, male, unspecified site: Secondary | ICD-10-CM | POA: Diagnosis present

## 2019-12-22 DIAGNOSIS — R339 Retention of urine, unspecified: Secondary | ICD-10-CM | POA: Diagnosis present

## 2019-12-22 DIAGNOSIS — D500 Iron deficiency anemia secondary to blood loss (chronic): Secondary | ICD-10-CM | POA: Diagnosis present

## 2019-12-22 DIAGNOSIS — I739 Peripheral vascular disease, unspecified: Secondary | ICD-10-CM | POA: Diagnosis present

## 2019-12-22 DIAGNOSIS — Z515 Encounter for palliative care: Secondary | ICD-10-CM | POA: Diagnosis not present

## 2019-12-22 DIAGNOSIS — Z7189 Other specified counseling: Secondary | ICD-10-CM

## 2019-12-22 DIAGNOSIS — M109 Gout, unspecified: Secondary | ICD-10-CM | POA: Diagnosis present

## 2019-12-22 DIAGNOSIS — F419 Anxiety disorder, unspecified: Secondary | ICD-10-CM | POA: Diagnosis present

## 2019-12-22 DIAGNOSIS — Z8546 Personal history of malignant neoplasm of prostate: Secondary | ICD-10-CM

## 2019-12-22 DIAGNOSIS — D62 Acute posthemorrhagic anemia: Secondary | ICD-10-CM | POA: Diagnosis present

## 2019-12-22 DIAGNOSIS — D75839 Thrombocytosis, unspecified: Secondary | ICD-10-CM

## 2019-12-22 DIAGNOSIS — E119 Type 2 diabetes mellitus without complications: Secondary | ICD-10-CM

## 2019-12-22 DIAGNOSIS — R748 Abnormal levels of other serum enzymes: Secondary | ICD-10-CM | POA: Diagnosis present

## 2019-12-22 DIAGNOSIS — Z7901 Long term (current) use of anticoagulants: Secondary | ICD-10-CM

## 2019-12-22 DIAGNOSIS — Z888 Allergy status to other drugs, medicaments and biological substances status: Secondary | ICD-10-CM

## 2019-12-22 DIAGNOSIS — E1165 Type 2 diabetes mellitus with hyperglycemia: Secondary | ICD-10-CM | POA: Diagnosis present

## 2019-12-22 DIAGNOSIS — F411 Generalized anxiety disorder: Secondary | ICD-10-CM | POA: Diagnosis present

## 2019-12-22 DIAGNOSIS — T83098A Other mechanical complication of other indwelling urethral catheter, initial encounter: Secondary | ICD-10-CM | POA: Diagnosis present

## 2019-12-22 DIAGNOSIS — E876 Hypokalemia: Secondary | ICD-10-CM | POA: Diagnosis present

## 2019-12-22 LAB — URINALYSIS, ROUTINE W REFLEX MICROSCOPIC
Bilirubin Urine: NEGATIVE
Glucose, UA: 50 mg/dL — AB
Ketones, ur: NEGATIVE mg/dL
Nitrite: NEGATIVE
Protein, ur: 100 mg/dL — AB
RBC / HPF: >50 RBC/hpf — ABNORMAL HIGH (ref 0–5)
Specific Gravity, Urine: 1.004 — ABNORMAL LOW (ref 1.005–1.030)
pH: 7 (ref 5.0–8.0)

## 2019-12-22 LAB — GLUCOSE, CAPILLARY
Glucose-Capillary: 140 mg/dL — ABNORMAL HIGH (ref 70–99)
Glucose-Capillary: 99 mg/dL (ref 70–99)
Glucose-Capillary: 111 mg/dL — ABNORMAL HIGH (ref 70–99)

## 2019-12-22 LAB — CBC WITH DIFFERENTIAL/PLATELET
Abs Immature Granulocytes: 0.03 10*3/uL (ref 0.00–0.07)
Basophils Absolute: 0 10*3/uL (ref 0.0–0.1)
Basophils Relative: 0 %
Eosinophils Absolute: 0.7 10*3/uL — ABNORMAL HIGH (ref 0.0–0.5)
Eosinophils Relative: 9 %
HCT: 22.6 % — ABNORMAL LOW (ref 39.0–52.0)
Hemoglobin: 6.7 g/dL — CL (ref 13.0–17.0)
Immature Granulocytes: 0 %
Lymphocytes Relative: 21 %
Lymphs Abs: 1.6 10*3/uL (ref 0.7–4.0)
MCH: 24.9 pg — ABNORMAL LOW (ref 26.0–34.0)
MCHC: 29.6 g/dL — ABNORMAL LOW (ref 30.0–36.0)
MCV: 84 fL (ref 80.0–100.0)
Monocytes Absolute: 0.5 10*3/uL (ref 0.1–1.0)
Monocytes Relative: 7 %
Neutro Abs: 4.7 10*3/uL (ref 1.7–7.7)
Neutrophils Relative %: 63 %
Platelets: 596 10*3/uL — ABNORMAL HIGH (ref 150–400)
RBC: 2.69 MIL/uL — ABNORMAL LOW (ref 4.22–5.81)
RDW: 17.5 % — ABNORMAL HIGH (ref 11.5–15.5)
WBC: 7.6 10*3/uL (ref 4.0–10.5)
nRBC: 0 % (ref 0.0–0.2)

## 2019-12-22 LAB — BASIC METABOLIC PANEL
Anion gap: 12 (ref 5–15)
BUN: 14 mg/dL (ref 8–23)
CO2: 26 mmol/L (ref 22–32)
Calcium: 7.9 mg/dL — ABNORMAL LOW (ref 8.9–10.3)
Chloride: 101 mmol/L (ref 98–111)
Creatinine, Ser: 0.86 mg/dL (ref 0.61–1.24)
GFR, Estimated: >60 mL/min (ref >60)
Glucose, Bld: 143 mg/dL — ABNORMAL HIGH (ref 70–99)
Potassium: 3.3 mmol/L — ABNORMAL LOW (ref 3.5–5.1)
Sodium: 139 mmol/L (ref 135–145)

## 2019-12-22 LAB — RESPIRATORY PANEL BY RT PCR (FLU A&B, COVID)
Influenza A by PCR: NEGATIVE
Influenza B by PCR: NEGATIVE
SARS Coronavirus 2 by RT PCR: NEGATIVE

## 2019-12-22 LAB — CBG MONITORING, ED: Glucose-Capillary: 124 mg/dL — ABNORMAL HIGH (ref 70–99)

## 2019-12-22 LAB — PREPARE RBC (CROSSMATCH)

## 2019-12-22 MED ORDER — ALPRAZOLAM 0.5 MG PO TABS: 0.5000 mg | ORAL_TABLET | Freq: Two times a day (BID) | ORAL | Status: DC | PRN

## 2019-12-22 MED ORDER — AMIODARONE HCL 200 MG PO TABS
200.0000 mg | ORAL_TABLET | Freq: Every day | ORAL | Status: DC
  Administered 2019-12-22 – 2019-12-25 (×4): 200 mg via ORAL
  Filled 2019-12-22 (×4): qty 1

## 2019-12-22 MED ORDER — ADULT MULTIVITAMIN W/MINERALS CH
1.0000 | ORAL_TABLET | Freq: Every day | ORAL | Status: DC
  Administered 2019-12-22 – 2019-12-25 (×4): 1 via ORAL
  Filled 2019-12-22 (×4): qty 1

## 2019-12-22 MED ORDER — CEFDINIR 300 MG PO CAPS
300.0000 mg | ORAL_CAPSULE | Freq: Two times a day (BID) | ORAL | Status: DC
  Administered 2019-12-22 – 2019-12-25 (×7): 300 mg via ORAL
  Filled 2019-12-22 (×7): qty 1

## 2019-12-22 MED ORDER — LEVOTHYROXINE SODIUM 100 MCG PO TABS
100.0000 ug | ORAL_TABLET | Freq: Every day | ORAL | Status: DC
  Administered 2019-12-22 – 2019-12-25 (×4): 100 ug via ORAL
  Filled 2019-12-22: qty 2
  Filled 2019-12-22 (×3): qty 1

## 2019-12-22 MED ORDER — CHLORHEXIDINE GLUCONATE CLOTH 2 % EX PADS
6.0000 | MEDICATED_PAD | Freq: Every day | CUTANEOUS | Status: DC
  Administered 2019-12-22 – 2019-12-24 (×3): 6 via TOPICAL

## 2019-12-22 MED ORDER — PRAVASTATIN SODIUM 10 MG PO TABS
10.0000 mg | ORAL_TABLET | Freq: Every day | ORAL | Status: DC
  Administered 2019-12-22 – 2019-12-24 (×3): 10 mg via ORAL
  Filled 2019-12-22 (×4): qty 1

## 2019-12-22 MED ORDER — TAMSULOSIN HCL 0.4 MG PO CAPS
0.4000 mg | ORAL_CAPSULE | Freq: Every day | ORAL | Status: DC
  Administered 2019-12-22 – 2019-12-25 (×4): 0.4 mg via ORAL
  Filled 2019-12-22 (×4): qty 1

## 2019-12-22 MED ORDER — METOPROLOL SUCCINATE ER 50 MG PO TB24
100.0000 mg | ORAL_TABLET | Freq: Every day | ORAL | Status: DC
  Administered 2019-12-22 – 2019-12-25 (×4): 100 mg via ORAL
  Filled 2019-12-22: qty 4
  Filled 2019-12-22 (×3): qty 2

## 2019-12-22 MED ORDER — ACETAMINOPHEN 325 MG PO TABS: 650.0000 mg | ORAL_TABLET | Freq: Four times a day (QID) | ORAL | Status: DC | PRN

## 2019-12-22 MED ORDER — INSULIN ASPART 100 UNIT/ML ~~LOC~~ SOLN
3.0000 [IU] | Freq: Three times a day (TID) | SUBCUTANEOUS | Status: DC
  Administered 2019-12-22 – 2019-12-25 (×8): 3 [IU] via SUBCUTANEOUS
  Filled 2019-12-22: qty 1

## 2019-12-22 MED ORDER — INSULIN GLARGINE 100 UNIT/ML ~~LOC~~ SOLN
15.0000 [IU] | Freq: Every day | SUBCUTANEOUS | Status: DC
  Administered 2019-12-22: 15 [IU] via SUBCUTANEOUS
  Filled 2019-12-22 (×3): qty 0.15

## 2019-12-22 MED ORDER — FERROUS SULFATE 325 (65 FE) MG PO TABS
325.0000 mg | ORAL_TABLET | Freq: Every day | ORAL | Status: DC
  Administered 2019-12-22 – 2019-12-25 (×4): 325 mg via ORAL
  Filled 2019-12-22 (×4): qty 1

## 2019-12-22 MED ORDER — SODIUM CHLORIDE 0.9 % IV SOLN
100.0000 mg | INTRAVENOUS | Status: DC
  Administered 2019-12-23 – 2019-12-24 (×2): 100 mg via INTRAVENOUS
  Filled 2019-12-22 (×7): qty 100

## 2019-12-22 MED ORDER — INSULIN ASPART 100 UNIT/ML ~~LOC~~ SOLN: 0.0000 [IU] | Freq: Every day | SUBCUTANEOUS | Status: DC

## 2019-12-22 MED ORDER — SODIUM CHLORIDE 0.9 % IV SOLN: 10.0000 mL/h | Freq: Once | INTRAVENOUS | Status: DC

## 2019-12-22 MED ORDER — INSULIN ASPART 100 UNIT/ML ~~LOC~~ SOLN
0.0000 [IU] | Freq: Three times a day (TID) | SUBCUTANEOUS | Status: DC
  Administered 2019-12-22: 2 [IU] via SUBCUTANEOUS
  Administered 2019-12-23: 5 [IU] via SUBCUTANEOUS
  Administered 2019-12-23 – 2019-12-24 (×4): 3 [IU] via SUBCUTANEOUS
  Administered 2019-12-25: 2 [IU] via SUBCUTANEOUS

## 2019-12-22 MED ORDER — SODIUM CHLORIDE 0.9 % IR SOLN
3000.0000 mL | Status: DC
  Administered 2019-12-22: 3000 mL

## 2019-12-22 MED ORDER — ACETAMINOPHEN 650 MG RE SUPP: 650.0000 mg | Freq: Four times a day (QID) | RECTAL | Status: DC | PRN

## 2019-12-22 MED ORDER — SENNOSIDES-DOCUSATE SODIUM 8.6-50 MG PO TABS
1.0000 | ORAL_TABLET | Freq: Two times a day (BID) | ORAL | Status: DC | PRN
  Filled 2019-12-22: qty 1

## 2019-12-22 MED ORDER — SODIUM CHLORIDE 0.9 % IV SOLN
200.0000 mg | Freq: Once | INTRAVENOUS | Status: AC
  Administered 2019-12-22: 200 mg via INTRAVENOUS
  Filled 2019-12-22: qty 200

## 2019-12-22 MED ORDER — OXYCODONE HCL 5 MG PO TABS
5.0000 mg | ORAL_TABLET | ORAL | Status: DC | PRN
  Administered 2019-12-24: 5 mg via ORAL
  Filled 2019-12-22 (×3): qty 1

## 2019-12-22 NOTE — ED Triage Notes (Signed)
Pt brought in by [facility] for c/o no urine output all day; pt c/o abdominal pain

## 2019-12-22 NOTE — ED Notes (Signed)
Catheter flushed with 867ml of sterile water with 133ml of bloody water with blood clots returned; pt states he feels better

## 2019-12-22 NOTE — ED Notes (Signed)
Date and time results received: 12/22/19 0404  Test: hemoglobin Critical Value: 6.7  Name of Provider Notified: Dr. Dina Rich  Orders Received? Or Actions Taken?: n/a

## 2019-12-22 NOTE — ED Provider Notes (Addendum)
[facility] EMERGENCY DEPARTMENT Provider Note   CSN: [id number] Arrival date & time: 12/22/19  0144     History Chief Complaint  Patient presents with  . Urinary Retention    Anthony Dunlap is a 86 y.o. male.  HPI     This is an 86 year old male with a history of atrial fibrillation on Eliquis, diabetes, prostate cancer, chronic indwelling Foley secondary to neurogenic bladder who presents with Foley catheter not draining.  Patient has had recent history of gross hematuria and multiple visits for recurrent Foley catheter malfunction.  He has required irrigation multiple times.  He has been on antibiotics for presumed UTI.  He was initially started on Keflex in mid September.  This was followed by Cipro.  He subsequently was transitioned to [facility] by his urologist on 10/27 x 7 days.  Per report, has had minimal urine output over the last 24 hours.  Daughter noted only 60 cc of output since yesterday morning.  Patient is complaining of abdominal pain.  No noted fevers or back pain.  Denies chest pain, shortness of breath, nausea, vomiting.  Past Medical History:  Diagnosis Date  . Anxiety   . Arthritis   . Atrial fibrillation ([place])   . Chronic heel ulcer, left, limited to breakdown of skin ([place]) 10/28/2019  . 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 ([place]) 04/21/2018  . Diabetes ([place])   . Drug-induced hepatitis 06/05/2018  . Gout   . High cholesterol   . Hypothyroidism   . Inguinal hernia    06/09/2019: per patient " a long time ago on the right side"  . Left eye pain   . Localized osteoarthrosis of right hip 01/05/2019  . Prostate cancer ([place])   . Prostate cancer [facility])     Patient Active Problem List   Diagnosis Date Noted  . Hematuria 12/22/2019  . Gross hematuria 12/16/2019  . Urinary retention 12/05/2019  . Hx of BKA, left ([place]) 12/05/2019  . PAD (peripheral artery disease) ([place]) 12/05/2019  . Decubitus ulcer of sacral region, stage 2 ([place]) 12/02/2019  .  Hyperglycemia due to diabetes mellitus ([place]) 12/02/2019  . Leukocytosis 12/02/2019  . Thrombocytosis 12/02/2019  . Hypoalbuminemia 12/02/2019  . Prolonged QT interval 12/02/2019  . Typical atrial flutter ([place])   . Atrial fibrillation with RVR ([place])   . Acute osteomyelitis of left calcaneus ([name])   . Sepsis ([place]) 11/05/2019  . Chronic heel ulcer, left, limited to breakdown of skin ([place]) 10/28/2019  . Acute osteomyelitis of calcaneum, right ([place]) 09/25/2019  . Critical lower limb ischemia ([place]) 08/11/2019  . Hx of total hip arthroplasty, right 08/06/2019  . Arthritis of right hip 06/12/2019  . Pelvic abscess in male [facility]) 09/11/2018  . Drug-induced hepatitis 06/05/2018  . Calculus of gallbladder without cholecystitis without obstruction   . Fungemia/Candida albicans 05/10/2018  . UTI (urinary tract infection) 05/08/2018  . Pseudomonas urinary tract infection 05/08/2018  . 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 ([place]) 04/21/2018  . Abscess of left thigh   . Pressure ulcer 02/10/2018  . Enterococcus faecalis infection 01/27/2018  . Abscess   . Psoas abscess ([place]) 01/23/2018  . Sepsis due to Bacteroides species ([place]) 01/22/2018  . Sepsis without acute organ dysfunction ([place])   . DKA (diabetic ketoacidoses) 01/17/2018  . Normocytic anemia 01/17/2018  . Chronic anemia 01/03/2018  . Chronic indwelling Foley catheter 01/03/2018  . Elevated alkaline phosphatase level 01/03/2018  . Hypothyroidism 01/03/2018  . Physical deconditioning 01/03/2018  . 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 HLD (hyperlipidemia) 08/19/2013  . Type 2 diabetes mellitus ([place])   . Prostate cancer [facility])     Past Surgical History:  Procedure Laterality Date  . ABDOMINAL AORTOGRAM W/LOWER EXTREMITY Bilateral 08/12/2019   Procedure: ABDOMINAL AORTOGRAM W/LOWER EXTREMITY;  Surgeon: Marty Heck, MD;  Location: [place] [facility];  Service: Cardiovascular;  Laterality: Bilateral;  .  ABDOMINAL AORTOGRAM W/LOWER EXTREMITY Left 08/19/2019   Procedure: ABDOMINAL AORTOGRAM W/LOWER EXTREMITY;  Surgeon: Marty Heck, MD;  Location: [place] [facility];  Service: Cardiovascular;  Laterality: Left;  . AMPUTATION Right 09/28/2019   Procedure: AMPUTATION BELOW KNEE;  Surgeon: Rosetta Posner, MD;  Location: [place];  Service: Vascular;  Laterality: Right;  . AMPUTATION Left 11/11/2019   Procedure: AMPUTATION BELOW KNEE;  Surgeon: Rosetta Posner, MD;  Location: [facility];  Service: Vascular;  Laterality: Left;  . ESOPHAGOGASTRODUODENOSCOPY (EGD) WITH PROPOFOL N/A 11/09/2019   Procedure: ESOPHAGOGASTRODUODENOSCOPY (EGD) WITH PROPOFOL;  Surgeon: Lavena Bullion, DO;  Location: [place];  Service: Gastroenterology;  Laterality: N/A;  . HERNIA REPAIR    . IR RADIOLOGIST EVAL & MGMT  03/06/2018  . PERIPHERAL VASCULAR BALLOON ANGIOPLASTY Left 08/19/2019   Procedure: PERIPHERAL VASCULAR BALLOON ANGIOPLASTY;  Surgeon: Marty Heck, MD;  Location: [place] [facility];  Service: Cardiovascular;  Laterality: Left;  . PROSTATE SURGERY    . TOTAL HIP ARTHROPLASTY Right 06/12/2019   Procedure: RIGHT TOTAL HIP ARTHROPLASTY DIRECT ANTERIOR;  Surgeon: Marybelle Killings, MD;  Location: [place];  Service: Orthopedics;  Laterality: Right;       Family History  Problem Relation Age of Onset  . Pneumonia Father   . Cancer Sister     Social History   Tobacco Use  . Smoking status: Never Smoker  . Smokeless tobacco: Never Used  Vaping Use  . Vaping Use: Never used  Substance Use Topics  . Alcohol use: No  . Drug use: No    Home Medications Prior to Admission medications   Medication Sig Start Date End Date Taking? Authorizing Provider  acetaminophen (TYLENOL) 500 MG tablet Take 1,000 mg by mouth every 6 (six) hours as needed for mild pain or moderate pain.    [provider]  ALPRAZolam Duanne Moron) 0.5 MG tablet Take 1 tablet (0.5 mg total) by mouth 2 (two) times daily as needed for  anxiety. 09/30/19   Geradine Girt, DO  amiodarone (PACERONE) 200 MG tablet Take 1 tablet (200 mg total) by mouth 2 (two) times daily for 14 days. Amiodarone 200mg  BID for 14 days and then once daily. 11/26/19 12/10/19  Bunnie Pion Z, DO  amitriptyline (ELAVIL) 10 MG tablet Take 20 mg by mouth at bedtime.  11/28/18   [provider]  apixaban (ELIQUIS) 5 MG TABS tablet Take 1 tablet (5 mg total) by mouth 2 (two) times daily. 11/15/19   Antonieta Pert, MD  cefpodoxime (VANTIN) 200 MG tablet Take 1 tablet (200 mg total) by mouth 2 (two) times daily. 12/16/19   McKenzie, Candee Furbish, MD  clopidogrel (PLAVIX) 75 MG tablet Take 1 tablet (75 mg total) by mouth daily. 08/19/19 08/18/20  Marty Heck, MD  colchicine 0.6 MG tablet Take 0.6 mg by mouth daily as needed (gout attacks).  07/27/19   [provider]  ferrous sulfate 325 (65 FE) MG tablet Take 1 tablet (325 mg total) by mouth 2 (two) times daily with a meal. 09/30/19   Eulogio Bear  U, DO  insulin aspart (NOVOLOG) 100 UNIT/ML injection Inject 0-15 Units into the skin 3 (three) times daily with meals. Sliding scale insulin Less than 70 initiate hypoglycemia protocol 70-120  0 units 120-150 2 unit 151-200 3 units 201-250 3 units 251-300 5 units 301-350 8 units 351-400 11 units  Greater than 400 15 units , call MD 02/18/18   Oswald Hillock, MD  levothyroxine (SYNTHROID) 100 MCG tablet Take 1 tablet (100 mcg total) by mouth daily with breakfast. 12/07/19   Dwyane Dee, MD  metoprolol succinate (TOPROL-XL) 100 MG 24 hr tablet Take 1 tablet (100 mg total) by mouth daily. Take with or immediately following a meal. 11/27/19   Bunnie Pion Z, DO  Multiple Vitamin (MULTIVITAMIN WITH MINERALS) TABS tablet Take 1 tablet by mouth daily. 09/30/19   Geradine Girt, DO  nutrition supplement, JUVEN, (JUVEN) PACK Take 1 packet by mouth 2 (two) times daily between meals. 09/30/19   Geradine Girt, DO  nystatin (MYCOSTATIN/NYSTOP) powder Apply 1  application topically daily as needed (yeast). 12/06/19   Dwyane Dee, MD  pantoprazole (PROTONIX) 40 MG tablet Take 1 tablet (40 mg total) by mouth daily. 09/30/19   Geradine Girt, DO  pravastatin (PRAVACHOL) 10 MG tablet Take 10 mg by mouth daily with supper.     [provider]  senna-docusate (SENOKOT-S) 8.6-50 MG tablet Take 1 tablet by mouth 2 (two) times daily. Patient taking differently: Take 1 tablet by mouth 2 (two) times daily as needed for mild constipation.  09/30/19   Geradine Girt, DO  silver sulfADIAZINE (SILVADENE) 1 % cream Apply topically 2 (two) times daily. 09/30/19   Geradine Girt, DO  SOLIQUA 100-33 UNT-MCG/ML SOPN Inject into the skin. 12/16/19   [provider]  tamsulosin (FLOMAX) 0.4 MG CAPS capsule Take 0.4 mg by mouth daily. 12/15/19   [provider]  tobramycin (TOBREX) 0.3 % ophthalmic solution Place 1 drop into both eyes in the morning and at bedtime. 12/06/19   Dwyane Dee, MD    Allergies    Fluconazole  Review of Systems   Review of Systems  Constitutional: Negative for fever.  Respiratory: Negative for shortness of breath.   Cardiovascular: Negative for chest pain.  Gastrointestinal: Positive for abdominal pain. Negative for nausea and vomiting.  Genitourinary: Positive for hematuria.       Foley blockage  All other systems reviewed and are negative.   Physical Exam Updated Vital Signs BP 137/72   Pulse 71   Temp 98.5 F (36.9 C) (Oral)   Ht 1.727 m (5\' 8" )   Wt 74.8 kg   SpO2 100%   BMI 25.07 kg/m   Physical Exam Vitals and nursing note reviewed.  Constitutional:      Appearance: He is well-developed.     Comments: Chronically ill-appearing  HENT:     Head: Normocephalic and atraumatic.     Mouth/Throat:     Mouth: Mucous membranes are moist.  Eyes:     Pupils: Pupils are equal, round, and reactive to light.  Cardiovascular:     Rate and Rhythm: Normal rate and regular rhythm.     Heart sounds:  Normal heart sounds. No murmur heard.   Pulmonary:     Effort: Pulmonary effort is normal. No respiratory distress.     Breath sounds: Normal breath sounds. No wheezing.  Abdominal:     General: Bowel sounds are normal.     Palpations: Abdomen is soft.  Tenderness: There is abdominal tenderness. There is no rebound.     Comments: Suprapubic tenderness palpation, no rebound or guarding  Genitourinary:    Comments: Minimal bloody output noted in catheter bag Musculoskeletal:     Cervical back: Neck supple.     Comments: BKA  Lymphadenopathy:     Cervical: No cervical adenopathy.  Skin:    General: Skin is warm and dry.  Neurological:     Mental Status: He is alert and oriented to person, place, and time.  Psychiatric:        Mood and Affect: Mood normal.     ED Results / Procedures / Treatments   Labs (all labs ordered are listed, but only abnormal results are displayed) Labs Reviewed  URINALYSIS, ROUTINE W REFLEX MICROSCOPIC - Abnormal; Notable for the following components:      Result Value   Color, Urine AMBER (*)    Specific Gravity, Urine 1.004 (*)    Glucose, UA 50 (*)    Hgb urine dipstick LARGE (*)    Protein, ur 100 (*)    Leukocytes,Ua TRACE (*)    RBC / HPF >50 (*)    Bacteria, UA RARE (*)    All other components within normal limits  CBC WITH DIFFERENTIAL/PLATELET - Abnormal; Notable for the following components:   RBC 2.69 (*)    Hemoglobin 6.7 (*)    HCT 22.6 (*)    MCH 24.9 (*)    MCHC 29.6 (*)    RDW 17.5 (*)    Platelets 596 (*)    Eosinophils Absolute 0.7 (*)    All other components within normal limits  BASIC METABOLIC PANEL - Abnormal; Notable for the following components:   Potassium 3.3 (*)    Glucose, Bld 143 (*)    Calcium 7.9 (*)    All other components within normal limits  RESPIRATORY PANEL BY RT PCR (FLU A&B, COVID)  URINE CULTURE  TYPE AND SCREEN  PREPARE RBC (CROSSMATCH)    EKG None  Radiology No results  found.  Procedures Procedures (including critical care time)  CRITICAL CARE Performed by: Merryl Hacker   Total critical care time: 35 minutes  Critical care time was exclusive of separately billable procedures and treating other patients.  Critical care was necessary to treat or prevent imminent or life-threatening deterioration.  Critical care was time spent personally by me on the following activities: development of treatment plan with patient and/or surrogate as well as nursing, discussions with consultants, evaluation of patient's response to treatment, examination of patient, obtaining history from patient or surrogate, ordering and performing treatments and interventions, ordering and review of laboratory studies, ordering and review of radiographic studies, pulse oximetry and re-evaluation of patient's condition.   Medications Ordered in ED Medications  0.9 %  sodium chloride infusion (has no administration in time range)  sodium chloride irrigation 0.9 % 3,000 mL (has no administration in time range)    ED Course  I have reviewed the triage vital signs and the nursing notes.  Pertinent labs & imaging results that were available during my care of the patient were reviewed by me and considered in my medical decision making (see chart for details).  Clinical Course as of Dec 22 655  Tue Dec 22, 2019  0551 Spoke with Dr. Lovena Neighbours, urology.  States that he likely needs to be admitted this point given his worsening anemia and ongoing bleeding.  Recommends admission to the hospitalist.  We will plan for transfusion  and he recommends consulting with Dr. Alyson Ingles after 7 AM.   [place] Follow-up phone call from Dr. Lovena Neighbours.  Recommends continuous bladder irrigation.  Order placed.   [CH]    Clinical Course User Index [CH] Tabitha Tupper, Barbette Hair, MD   MDM Rules/Calculators/A&P                          Patient presents with Foley catheter obstruction.  Recent history of gross  hematuria and recurrent obstruction requiring significant flushing.  He has also had a recent UTI and is on Vantin per his primary urologist.  He is uncomfortable on exam but nontoxic and afebrile.  Nursing was able to flush catheter with approximately 800 cc of saline and returned large clots and a total of 1300 cc of bloody return.  Patient had significant relief of his abdominal pain.  Given recurrent obstruction and very minimal output over the last 24 hours, obtain basic labs including CBC and BMP to evaluate for renal function.  Unfortunately, hemoglobin has down trended from a baseline of around 9 to 6.7.  This appears to have been gradual and patient is hemodynamically stable.  However, he has a history of heart disease.  For this reason, type and screen was ordered.  Creatinine is preserved and he is producing urine output at this time.  See discussion with urology above.  Given his worsening anemia and ongoing issues with gross hematuria and Foley blockage, recommend admission for transfusion and urology evaluation for possible cystoscopy.  On-call urologist recommends consulting with Dr. Alyson Ingles during daytime hours as this is patient's primary urologist.  Patient is agreeable to plan.  Discussed with hospitalist, Dr. Olevia Bowens.  1 unit of blood has been ordered for transfusion and patient was consented.  Final Clinical Impression(s) / ED Diagnoses Final diagnoses:  Gross hematuria  Blood loss anemia    Rx / DC Orders ED Discharge Orders    None       Alliyah Roesler, Barbette Hair, MD 12/22/19 [id number]    Merryl Hacker, MD 12/22/19 [phone]

## 2019-12-22 NOTE — Consult Note (Signed)
Urology Consult  Referring physician: Dr. Wynetta Emery Reason for referral: Gross hematuria  Chief Complaint: Gross hematuria  History of Present Illness: Anthony Dunlap is a 86yo with a history of urethral stricture and neurogenic bladder managed with a chronic indwelling foley who was admitted with anemia and gross hematuria with clot retention. He was seen last week in my clinic with gross hematuria related to UTI and was started on Vantin. He hematuria was improving until this morning when the urine became dark red and the catheter stopped draining. He presented to the ER and in the ER the patient had a clot evacuation and the foley started draining. He was placed on CBI and currently his urine is clear. His urine culture grew yeast.  Currently the patient denies any pelvic/suprapubic pain. No fevers. Hgb was 6.7 on admission.   Past Medical History:  Diagnosis Date  . Anxiety   . Arthritis   . Atrial fibrillation ([place])   . Chronic heel ulcer, left, limited to breakdown of skin ([place]) 10/28/2019  . 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 ([place]) 04/21/2018  . Diabetes ([place])   . Drug-induced hepatitis 06/05/2018  . Gout   . High cholesterol   . Hypothyroidism   . Inguinal hernia    06/09/2019: per patient " a long time ago on the right side"  . Left eye pain   . Localized osteoarthrosis of right hip 01/05/2019  . Prostate cancer ([place])   . Prostate cancer [facility])    Past Surgical History:  Procedure Laterality Date  . ABDOMINAL AORTOGRAM W/LOWER EXTREMITY Bilateral 08/12/2019   Procedure: ABDOMINAL AORTOGRAM W/LOWER EXTREMITY;  Surgeon: Marty Heck, MD;  Location: [facility];  Service: Cardiovascular;  Laterality: Bilateral;  . ABDOMINAL AORTOGRAM W/LOWER EXTREMITY Left 08/19/2019   Procedure: ABDOMINAL AORTOGRAM W/LOWER EXTREMITY;  Surgeon: Marty Heck, MD;  Location: [facility];  Service: Cardiovascular;  Laterality: Left;  . AMPUTATION Right 09/28/2019    Procedure: AMPUTATION BELOW KNEE;  Surgeon: Rosetta Posner, MD;  Location: [facility];  Service: Vascular;  Laterality: Right;  . AMPUTATION Left 11/11/2019   Procedure: AMPUTATION BELOW KNEE;  Surgeon: Rosetta Posner, MD;  Location: [facility];  Service: Vascular;  Laterality: Left;  . ESOPHAGOGASTRODUODENOSCOPY (EGD) WITH PROPOFOL N/A 11/09/2019   Procedure: ESOPHAGOGASTRODUODENOSCOPY (EGD) WITH PROPOFOL;  Surgeon: Lavena Bullion, DO;  Location: [facility];  Service: Gastroenterology;  Laterality: N/A;  . HERNIA REPAIR    . IR RADIOLOGIST EVAL & MGMT  03/06/2018  . PERIPHERAL VASCULAR BALLOON ANGIOPLASTY Left 08/19/2019   Procedure: PERIPHERAL VASCULAR BALLOON ANGIOPLASTY;  Surgeon: Marty Heck, MD;  Location: [facility];  Service: Cardiovascular;  Laterality: Left;  . PROSTATE SURGERY    . TOTAL HIP ARTHROPLASTY Right 06/12/2019   Procedure: RIGHT TOTAL HIP ARTHROPLASTY DIRECT ANTERIOR;  Surgeon: Marybelle Killings, MD;  Location: [facility];  Service: Orthopedics;  Laterality: Right;    Medications: I have reviewed the patient's current medications. Allergies:  Allergies  Allergen Reactions  . Fluconazole Other (See Comments)    Drug-induced hepatitis    Family History  Problem Relation Age of Onset  . Pneumonia Father   . Cancer Sister    Social History:  reports that he has never smoked. He has never used smokeless tobacco. He reports that he does not drink alcohol and does not use drugs.  Review of Systems  Genitourinary: Positive for hematuria.  All other systems reviewed and are negative.  Physical Exam:  Vital signs in last 24 hours: Temp:  [97.5 F (36.4 C)-98.5 F (36.9 C)] 97.5 F (36.4 C) (11/02 1320) Pulse Rate:  [70-81] 72 (11/02 1320) Resp:  [13-18] 18 (11/02 1320) BP: (114-155)/(63-89) 142/78 (11/02 1320) SpO2:  [95 %-100 %] 100 % (11/02 1320) Weight:  [70.4 kg-74.8 kg] 70.4 kg (11/02 1123) Physical Exam Vitals and nursing note reviewed.   Constitutional:      Appearance: Normal appearance.  HENT:     Head: Normocephalic and atraumatic.     Nose: Nose normal.     Mouth/Throat:     Mouth: Mucous membranes are dry.  Eyes:     Extraocular Movements: Extraocular movements intact.     Pupils: Pupils are equal, round, and reactive to light.  Cardiovascular:     Rate and Rhythm: Normal rate and regular rhythm.  Pulmonary:     Effort: Pulmonary effort is normal. No respiratory distress.  Abdominal:     General: Abdomen is flat. There is no distension.  Musculoskeletal:        General: No swelling. Normal range of motion.     Cervical back: Normal range of motion and neck supple.  Skin:    General: Skin is warm and dry.  Neurological:     General: No focal deficit present.     Mental Status: He is alert and oriented to person, place, and time. Mental status is at baseline.  Psychiatric:        Mood and Affect: Mood normal.        Behavior: Behavior normal.        Thought Content: Thought content normal.        Judgment: Judgment normal.     Laboratory Data:  Results for orders placed or performed during the hospital encounter of 12/22/19 (from the past 72 hour(s))  CBC with Differential     Status: Abnormal   Collection Time: 12/22/19  3:16 AM  Result Value Ref Range   WBC 7.6 4.0 - 10.5 K/uL   RBC 2.69 (L) 4.22 - 5.81 MIL/uL   Hemoglobin 6.7 (LL) 13.0 - 17.0 g/dL    Comment: REPEATED TO VERIFY THIS CRITICAL RESULT HAS VERIFIED AND BEEN CALLED TO K WATLINGTON,RN BY MARIE KELLY ON 11 02 2021 AT 0403, AND HAS BEEN READ BACK.     HCT 22.6 (L) 39 - 52 %   MCV 84.0 80.0 - 100.0 fL   MCH 24.9 (L) 26.0 - 34.0 pg   MCHC 29.6 (L) 30.0 - 36.0 g/dL   RDW 17.5 (H) 11.5 - 15.5 %   Platelets 596 (H) 150 - 400 K/uL   nRBC 0.0 0.0 - 0.2 %   Neutrophils Relative % 63 %   Neutro Abs 4.7 1.7 - 7.7 K/uL   Lymphocytes Relative 21 %   Lymphs Abs 1.6 0.7 - 4.0 K/uL   Monocytes Relative 7 %   Monocytes Absolute 0.5 0.1 - 1.0  K/uL   Eosinophils Relative 9 %   Eosinophils Absolute 0.7 (H) 0.0 - 0.5 K/uL   Basophils Relative 0 %   Basophils Absolute 0.0 0.0 - 0.1 K/uL   Immature Granulocytes 0 %   Abs Immature Granulocytes 0.03 0.00 - 0.07 K/uL    Comment: Performed at [facility], [address], [place], [place] [address]  Basic metabolic panel     Status: Abnormal   Collection Time: 12/22/19  3:16 AM  Result Value Ref Range   Sodium 139 135 - 145  mmol/L   Potassium 3.3 (L) 3.5 - 5.1 mmol/L   Chloride 101 98 - 111 mmol/L   CO2 26 22 - 32 mmol/L   Glucose, Bld 143 (H) 70 - 99 mg/dL    Comment: Glucose reference range applies only to samples taken after fasting for at least 8 hours.   BUN 14 8 - 23 mg/dL   Creatinine, Ser 0.86 0.61 - 1.24 mg/dL   Calcium 7.9 (L) 8.9 - 10.3 mg/dL   GFR, Estimated >60 >60 mL/min    Comment: (NOTE) Calculated using the CKD-EPI Creatinine Equation (2021)    Anion gap 12 5 - 15    Comment: Performed at [facility], [address], [place], [place] [address]  Urinalysis, Routine w reflex microscopic Urine, Catheterized     Status: Abnormal   Collection Time: 12/22/19  3:58 AM  Result Value Ref Range   Color, Urine AMBER (A) YELLOW   APPearance CLEAR CLEAR   Specific Gravity, Urine 1.004 (L) 1.005 - 1.030   pH 7.0 5.0 - 8.0   Glucose, UA 50 (A) NEGATIVE mg/dL   Hgb urine dipstick LARGE (A) NEGATIVE   Bilirubin Urine NEGATIVE NEGATIVE   Ketones, ur NEGATIVE NEGATIVE mg/dL   Protein, ur 100 (A) NEGATIVE mg/dL   Nitrite NEGATIVE NEGATIVE   Leukocytes,Ua TRACE (A) NEGATIVE   RBC / HPF >50 (H) 0 - 5 RBC/hpf   WBC, UA 21-50 0 - 5 WBC/hpf   Bacteria, UA RARE (A) NONE SEEN   Ca Oxalate Crys, UA PRESENT     Comment: Performed at [facility], [address] [place], [place] [address]  Respiratory Panel by RT PCR (Flu A&B, Covid) - Nasopharyngeal Swab     Status: None   Collection Time: 12/22/19  4:27 AM   Specimen: Nasopharyngeal Swab  Result Value Ref Range   SARS  Coronavirus 2 by RT PCR NEGATIVE NEGATIVE    Comment: (NOTE) SARS-CoV-2 target nucleic acids are NOT DETECTED.  The SARS-CoV-2 RNA is generally detectable in upper respiratoy specimens during the acute phase of infection. The lowest concentration of SARS-CoV-2 viral copies this assay can detect is 131 copies/mL. A negative result does not preclude SARS-Cov-2 infection and should not be used as the sole basis for treatment or other patient management decisions. A negative result may occur with  improper specimen collection/handling, submission of specimen other than nasopharyngeal swab, presence of viral mutation(s) within the areas targeted by this assay, and inadequate number of viral copies (<131 copies/mL). A negative result must be combined with clinical observations, patient history, and epidemiological information. The expected result is Negative.  Fact Sheet for Patients:  PinkCheek.be  Fact Sheet for Healthcare Providers:  GravelBags.it  This test is no t yet approved or cleared by the [place] FDA and  has been authorized for detection and/or diagnosis of SARS-CoV-2 by FDA under an Emergency Use Authorization (EUA). This EUA will remain  in effect (meaning this test can be used) for the duration of the COVID-19 declaration under Section 564(b)(1) of the Act, 21 U.S.C. section 360bbb-3(b)(1), unless the authorization is terminated or revoked sooner.     Influenza A by PCR NEGATIVE NEGATIVE   Influenza B by PCR NEGATIVE NEGATIVE    Comment: (NOTE) The Xpert Xpress SARS-CoV-2/FLU/RSV assay is intended as an aid in  the diagnosis of influenza from Nasopharyngeal swab specimens and  should not be used as a sole basis for treatment. Nasal washings and  aspirates are unacceptable for Xpert Xpress  SARS-CoV-2/FLU/RSV  testing.  Fact Sheet for Patients: PinkCheek.be  Fact Sheet for  Healthcare Providers: GravelBags.it  This test is not yet approved or cleared by the [place] FDA and  has been authorized for detection and/or diagnosis of SARS-CoV-2 by  FDA under an Emergency Use Authorization (EUA). This EUA will remain  in effect (meaning this test can be used) for the duration of the  Covid-19 declaration under Section 564(b)(1) of the Act, 21  U.S.C. section 360bbb-3(b)(1), unless the authorization is  terminated or revoked. Performed at [facility], [address] [place], [place] [address]   Type and screen [facility]     Status: None (Preliminary result)   Collection Time: 12/22/19  4:54 AM  Result Value Ref Range   ABO/RH(D) O POS    Antibody Screen NEG    Sample Expiration 12/25/2019,2359    Unit Number [id number]    Blood Component Type RED CELLS,LR    Unit division 00    Status of Unit ISSUED    Transfusion Status OK TO TRANSFUSE    Crossmatch Result      Compatible Performed at [facility], [address] [place], [place] [address]   Prepare RBC (crossmatch)     Status: None   Collection Time: 12/22/19  4:54 AM  Result Value Ref Range   Order Confirmation      ORDER PROCESSED BY BLOOD BANK Performed at [facility], [address], [place], [place] [address]   CBG monitoring, ED     Status: Abnormal   Collection Time: 12/22/19  8:00 AM  Result Value Ref Range   Glucose-Capillary 124 (H) 70 - 99 mg/dL    Comment: Glucose reference range applies only to samples taken after fasting for at least 8 hours.  Glucose, capillary     Status: Abnormal   Collection Time: 12/22/19 11:16 AM  Result Value Ref Range   Glucose-Capillary 140 (H) 70 - 99 mg/dL    Comment: Glucose reference range applies only to samples taken after fasting for at least 8 hours.   Recent Results (from the past 240 hour(s))  Urine culture     Status: Abnormal   Collection Time: 12/14/19  2:05 PM   Specimen: Urine, Random  Result  Value Ref Range Status   Specimen Description   Final    URINE, RANDOM Performed at [facility], [address], [place], [place] [address]    Special Requests   Final    NONE Performed at [facility], [address] [place], [place] [address]    Culture >=100,000 COLONIES/mL YEAST (A)  Final   Report Status 12/16/2019 FINAL  Final  Respiratory Panel by RT PCR (Flu A&B, Covid) - Nasopharyngeal Swab     Status: None   Collection Time: 12/22/19  4:27 AM   Specimen: Nasopharyngeal Swab  Result Value Ref Range Status   SARS Coronavirus 2 by RT PCR NEGATIVE NEGATIVE Final    Comment: (NOTE) SARS-CoV-2 target nucleic acids are NOT DETECTED.  The SARS-CoV-2 RNA is generally detectable in upper respiratoy specimens during the acute phase of infection. The lowest concentration of SARS-CoV-2 viral copies this assay can detect is 131 copies/mL. A negative result does not preclude SARS-Cov-2 infection and should not be used as the sole basis for treatment or other patient management decisions. A negative result may occur with  improper specimen collection/handling, submission of specimen other than nasopharyngeal swab, presence of viral mutation(s) within the areas targeted by this assay,  and inadequate number of viral copies (<131 copies/mL). A negative result must be combined with clinical observations, patient history, and epidemiological information. The expected result is Negative.  Fact Sheet for Patients:  PinkCheek.be  Fact Sheet for Healthcare Providers:  GravelBags.it  This test is no t yet approved or cleared by the [place] FDA and  has been authorized for detection and/or diagnosis of SARS-CoV-2 by FDA under an Emergency Use Authorization (EUA). This EUA will remain  in effect (meaning this test can be used) for the duration of the COVID-19 declaration under Section 564(b)(1) of the Act, 21 U.S.C. section  360bbb-3(b)(1), unless the authorization is terminated or revoked sooner.     Influenza A by PCR NEGATIVE NEGATIVE Final   Influenza B by PCR NEGATIVE NEGATIVE Final    Comment: (NOTE) The Xpert Xpress SARS-CoV-2/FLU/RSV assay is intended as an aid in  the diagnosis of influenza from Nasopharyngeal swab specimens and  should not be used as a sole basis for treatment. Nasal washings and  aspirates are unacceptable for Xpert Xpress SARS-CoV-2/FLU/RSV  testing.  Fact Sheet for Patients: PinkCheek.be  Fact Sheet for Healthcare Providers: GravelBags.it  This test is not yet approved or cleared by the [place] FDA and  has been authorized for detection and/or diagnosis of SARS-CoV-2 by  FDA under an Emergency Use Authorization (EUA). This EUA will remain  in effect (meaning this test can be used) for the duration of the  Covid-19 declaration under Section 564(b)(1) of the Act, 21  U.S.C. section 360bbb-3(b)(1), unless the authorization is  terminated or revoked. Performed at [facility], [address], [place], [place] [address]    Creatinine: Recent Labs    12/22/19 0316  CREATININE 0.86   Baseline Creatinine: 0.8  Impression/Assessment:  86yo with gross hematuria, urethral stricture and hx of neurogenic bladder  Plan:  1. The patients urine is currently clear on slow drip CBI and his foley is draining well. We will attempt to wean CBI to off and if we are unable to wean CBI we will proceed with cystoscopy with bladder fulgeration/biopsy. Please continue antifungal per pharmacy due to recent urine culture that showed yeast  Nicolette Bang 12/22/2019, 2:59 PM

## 2019-12-22 NOTE — ED Notes (Signed)
Patient denies pain and is resting comfortably.  

## 2019-12-22 NOTE — Progress Notes (Signed)
CBI stopped per verbal order by Dr. Alyson Ingles. Nurse is to restart CBI if bloody urine or increased clots noted. Total output from foley catheter is 2400 mL. Last urine output emptied from foley was yellow and clear.

## 2019-12-22 NOTE — H&P (Signed)
History and Physical  [facility]  Anthony Dunlap HEN:[id number] DOB: December 09, 1933 DOA: 12/22/2019  PCP: [name], MD  Patient coming from: Home I have personally briefly reviewed patient's old medical records in [place]  Chief Complaint: No urine output 24 hours  HPI: Anthony Dunlap is a 86 y.o. male with medical history significant for chronic atrial fibrillation fully anticoagulated with apixaban, severe peripheral vascular disease on Plavix, chronic urinary retention with a chronic indwelling Foley catheter, recently status post bilateral BKA, hypothyroidism, generalized anxiety disorder, history of prostate cancer, hyperlipidemia, gout who has been followed closely by his urologist Dr. Alyson Ingles.  He presented last week with complaints of blood in the urine.  This was thought to be secondary to a UTI and he had been placed on a course of Cefpodoxime.  His cultures have now grown out yeast infection.  Patient presented to the emergency department because he had no urine output from his Foley in over 24 hours.  After the Foley was irrigated there were multiple blood clots blocking Foley output and he was noted to have significant gross hematuria.  His hemoglobin had come down to 6.9.  Neurology was consulted in the emergency department and Dr. [name] recommended starting continuous bladder irrigation.  The patient was typed and crossed and transfused 1 unit packed red blood cells.  Hospital admission was requested for further management.  Review of Systems: As per HPI otherwise 10 point review of systems negative.   Past Medical History:  Diagnosis Date  . Anxiety   . Arthritis   . Atrial fibrillation ([place])   . Chronic heel ulcer, left, limited to breakdown of skin ([place]) 10/28/2019  . 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 ([place]) 04/21/2018  . Diabetes ([place])   . Drug-induced hepatitis 06/05/2018  . Gout   . High cholesterol   . Hypothyroidism   . Inguinal  hernia    06/09/2019: per patient " a long time ago on the right side"  . Left eye pain   . Localized osteoarthrosis of right hip 01/05/2019  . Prostate cancer ([place])   . Prostate cancer [facility])     Past Surgical History:  Procedure Laterality Date  . ABDOMINAL AORTOGRAM W/LOWER EXTREMITY Bilateral 08/12/2019   Procedure: ABDOMINAL AORTOGRAM W/LOWER EXTREMITY;  Surgeon: Marty Heck, MD;  Location: [place] [facility];  Service: Cardiovascular;  Laterality: Bilateral;  . ABDOMINAL AORTOGRAM W/LOWER EXTREMITY Left 08/19/2019   Procedure: ABDOMINAL AORTOGRAM W/LOWER EXTREMITY;  Surgeon: Marty Heck, MD;  Location: [place] [facility];  Service: Cardiovascular;  Laterality: Left;  . AMPUTATION Right 09/28/2019   Procedure: AMPUTATION BELOW KNEE;  Surgeon: Rosetta Posner, MD;  Location: [place];  Service: Vascular;  Laterality: Right;  . AMPUTATION Left 11/11/2019   Procedure: AMPUTATION BELOW KNEE;  Surgeon: Rosetta Posner, MD;  Location: [facility];  Service: Vascular;  Laterality: Left;  . ESOPHAGOGASTRODUODENOSCOPY (EGD) WITH PROPOFOL N/A 11/09/2019   Procedure: ESOPHAGOGASTRODUODENOSCOPY (EGD) WITH PROPOFOL;  Surgeon: Lavena Bullion, DO;  Location: [place];  Service: Gastroenterology;  Laterality: N/A;  . HERNIA REPAIR    . IR RADIOLOGIST EVAL & MGMT  03/06/2018  . PERIPHERAL VASCULAR BALLOON ANGIOPLASTY Left 08/19/2019   Procedure: PERIPHERAL VASCULAR BALLOON ANGIOPLASTY;  Surgeon: Marty Heck, MD;  Location: [place] [facility];  Service: Cardiovascular;  Laterality: Left;  . PROSTATE SURGERY    . TOTAL HIP ARTHROPLASTY Right 06/12/2019   Procedure: RIGHT TOTAL HIP ARTHROPLASTY DIRECT ANTERIOR;  Surgeon: Marybelle Killings, MD;  Location: [place];  Service: Orthopedics;  Laterality: Right;     reports that he has never smoked. He has never used smokeless tobacco. He reports that he does not drink alcohol and does not use drugs.  Allergies  Allergen Reactions  . Fluconazole  Other (See Comments)    Drug-induced hepatitis    Family History  Problem Relation Age of Onset  . Pneumonia Father   . Cancer Sister    Prior to Admission medications   Medication Sig Start Date End Date Taking? Authorizing Provider  acetaminophen (TYLENOL) 500 MG tablet Take 1,000 mg by mouth every 6 (six) hours as needed for mild pain or moderate pain.    [provider]  ALPRAZolam Duanne Moron) 0.5 MG tablet Take 1 tablet (0.5 mg total) by mouth 2 (two) times daily as needed for anxiety. 09/30/19   Geradine Girt, DO  amiodarone (PACERONE) 200 MG tablet Take 1 tablet (200 mg total) by mouth 2 (two) times daily for 14 days. Amiodarone 200mg  BID for 14 days and then once daily. 11/26/19 12/10/19  Bunnie Pion Z, DO  amitriptyline (ELAVIL) 10 MG tablet Take 20 mg by mouth at bedtime.  11/28/18   [provider]  apixaban (ELIQUIS) 5 MG TABS tablet Take 1 tablet (5 mg total) by mouth 2 (two) times daily. 11/15/19   Antonieta Pert, MD  cefpodoxime (VANTIN) 200 MG tablet Take 1 tablet (200 mg total) by mouth 2 (two) times daily. 12/16/19   McKenzie, Candee Furbish, MD  clopidogrel (PLAVIX) 75 MG tablet Take 1 tablet (75 mg total) by mouth daily. 08/19/19 08/18/20  Marty Heck, MD  colchicine 0.6 MG tablet Take 0.6 mg by mouth daily as needed (gout attacks).  07/27/19   [provider]  ferrous sulfate 325 (65 FE) MG tablet Take 1 tablet (325 mg total) by mouth 2 (two) times daily with a meal. 09/30/19   Vann, Jessica U, DO  insulin aspart (NOVOLOG) 100 UNIT/ML injection Inject 0-15 Units into the skin 3 (three) times daily with meals. Sliding scale insulin Less than 70 initiate hypoglycemia protocol 70-120  0 units 120-150 2 unit 151-200 3 units 201-250 3 units 251-300 5 units 301-350 8 units 351-400 11 units  Greater than 400 15 units , call MD 02/18/18   Oswald Hillock, MD  levothyroxine (SYNTHROID) 100 MCG tablet Take 1 tablet (100 mcg total) by mouth daily with  breakfast. 12/07/19   Dwyane Dee, MD  metoprolol succinate (TOPROL-XL) 100 MG 24 hr tablet Take 1 tablet (100 mg total) by mouth daily. Take with or immediately following a meal. 11/27/19   Bunnie Pion Z, DO  Multiple Vitamin (MULTIVITAMIN WITH MINERALS) TABS tablet Take 1 tablet by mouth daily. 09/30/19   Geradine Girt, DO  nutrition supplement, JUVEN, (JUVEN) PACK Take 1 packet by mouth 2 (two) times daily between meals. 09/30/19   Geradine Girt, DO  nystatin (MYCOSTATIN/NYSTOP) powder Apply 1 application topically daily as needed (yeast). 12/06/19   Dwyane Dee, MD  pantoprazole (PROTONIX) 40 MG tablet Take 1 tablet (40 mg total) by mouth daily. 09/30/19   Geradine Girt, DO  pravastatin (PRAVACHOL) 10 MG tablet Take 10 mg by mouth daily with supper.     [provider]  senna-docusate (SENOKOT-S) 8.6-50 MG tablet Take 1 tablet by mouth 2 (two) times daily. Patient taking differently: Take 1 tablet by mouth 2 (two) times daily as needed for mild constipation.  09/30/19   Geradine Girt, DO  silver sulfADIAZINE (SILVADENE) 1 % cream Apply topically 2 (two) times daily. 09/30/19   Geradine Girt, DO  SOLIQUA 100-33 UNT-MCG/ML SOPN Inject into the skin. 12/16/19   [provider]  tamsulosin (FLOMAX) 0.4 MG CAPS capsule Take 0.4 mg by mouth daily. 12/15/19   [provider]  tobramycin (TOBREX) 0.3 % ophthalmic solution Place 1 drop into both eyes in the morning and at bedtime. 12/06/19   Dwyane Dee, MD   Physical Exam: Vitals:   12/22/19 0530 12/22/19 0600 12/22/19 0630 12/22/19 0700  BP: 123/70 127/74 137/72 128/69  Pulse: 72 75 71 73  Resp:    18  Temp:      TempSrc:      SpO2: 97% 100% 100% 96%  Weight:      Height:       Constitutional: Frail elderly chronically ill-appearing male appears pale NAD, calm, comfortable Eyes: PERRL, lids and conjunctivae normal ENMT: Mucous membranes are dry. Posterior pharynx clear of any exudate or lesions.   Neck: normal, supple, no masses, no thyromegaly Respiratory: Bilateral breath sounds mostly clear, no wheezing, no crackles. Normal respiratory effort. No accessory muscle use.  Cardiovascular: Normal S1-S2 sounds, no murmurs / rubs / gallops. No extremity edema. No carotid bruits.  Abdomen: no tenderness, no masses palpated. No hepatosplenomegaly. Bowel sounds positive.  GU: Chronic indwelling Foley catheter with gross hematuria seen. Musculoskeletal: Bilateral BKA's with wounds healing well.  No clubbing / cyanosis. No joint deformity upper and lower extremities. Good ROM, no contractures. Normal muscle tone.  Skin: no rashes, lesions, ulcers. No induration Neurologic: CN 2-12 grossly intact. Sensation intact, DTR normal. Strength 5/5 in all 4.  Psychiatric: Alert and oriented x 3. Normal mood.   Labs on Admission: I have personally reviewed following labs and imaging studies  CBC: Recent Labs  Lab 12/22/19 0316  WBC 7.6  NEUTROABS 4.7  HGB 6.7*  HCT 22.6*  MCV 84.0  PLT 176*   Basic Metabolic Panel: Recent Labs  Lab 12/22/19 0316  NA 139  K 3.3*  CL 101  CO2 26  GLUCOSE 143*  BUN 14  CREATININE 0.86  CALCIUM 7.9*   GFR: Estimated Creatinine Clearance: 61.9 mL/min (by C-G formula based on SCr of 0.86 mg/dL). Liver Function Tests: No results for input(s): AST, ALT, ALKPHOS, BILITOT, PROT, ALBUMIN in the last 168 hours. No results for input(s): LIPASE, AMYLASE in the last 168 hours. No results for input(s): AMMONIA in the last 168 hours. Coagulation Profile: No results for input(s): INR, PROTIME in the last 168 hours. Cardiac Enzymes: No results for input(s): CKTOTAL, CKMB, CKMBINDEX, TROPONINI in the last 168 hours. BNP (last 3 results) No results for input(s): PROBNP in the last 8760 hours. HbA1C: No results for input(s): HGBA1C in the last 72 hours. CBG: No results for input(s): GLUCAP in the last 168 hours. Lipid Profile: No results for input(s): CHOL, HDL,  LDLCALC, TRIG, CHOLHDL, LDLDIRECT in the last 72 hours. Thyroid Function Tests: No results for input(s): TSH, T4TOTAL, FREET4, T3FREE, THYROIDAB in the last 72 hours. Anemia Panel: No results for input(s): VITAMINB12, FOLATE, FERRITIN, TIBC, IRON, RETICCTPCT in the last 72 hours. Urine analysis:    Component Value Date/Time   COLORURINE AMBER (A) 12/22/2019 0358   APPEARANCEUR CLEAR 12/22/2019 0358   LABSPEC 1.004 (L) 12/22/2019 0358   PHURINE 7.0 12/22/2019 0358   GLUCOSEU 50 (A) 12/22/2019 0358   HGBUR LARGE (A) 12/22/2019 0358  BILIRUBINUR NEGATIVE 12/22/2019 [place] 12/22/2019 0358   PROTEINUR 100 (A) 12/22/2019 0358   UROBILINOGEN 0.2 09/03/2013 0132   NITRITE NEGATIVE 12/22/2019 0358   LEUKOCYTESUR TRACE (A) 12/22/2019 0358   Radiological Exams on Admission: No results found.  Assessment/Plan Active Problems:   Type 2 diabetes mellitus ([name])   Sixth nerve palsy of left eye   HLD (hyperlipidemia)   Elevated alkaline phosphatase level   Hypothyroidism   Physical deconditioning   UTI (urinary tract infection)   Hx of total hip arthroplasty, right   Typical atrial flutter ([name])   Hyperglycemia due to diabetes mellitus ([name])   Thrombocytosis   Hypoalbuminemia   Urinary retention   Hx of BKA, left ([name])   PAD (peripheral artery disease) ([name])   Gross hematuria   Hematuria   Acute blood loss anemia   1. Gross Hematuria -this is a significant bleed given that he had large clots in the bladder and it blocked his Foley and his hemoglobin has dropped.  He has been placed on continuous bladder irrigation.  I contacted Dr. Alyson Ingles his urologist and requested that he consult on the patient in the hospital and he agreed to do so.  Patient is being admitted to the telemetry unit and he is being transfused 1 unit PRBC and likely will need an additional unit of PRBC. 2. Fungal UTI-after speaking with Dr. Alyson Ingles he noted that urine culture done in his office was  positive for fungal infection.  I have ordered IV anidalufungin per pharmD recommendations.   3. Chronic atrial fibrillation-resume home amiodarone, holding apixaban due to active gross hematuria. 4. PAD-holding Plavix due to active gross hematuria. 5. Acute blood loss anemia-transfusing 1 unit PRBC and likely will need to transfuse an additional 1 unit PRBC.  Follow CBC. 6. Chronic urinary retention-Foley has been irrigated and now flowing urine.  He remains at high risk for obstruction due to clots being formed in the bladder.  Urology consultation requested. 7. Physical deconditioning-obtain PT evaluation when medically stabilized. 8. Goals of care discussion-I have asked for a palliative medicine consultation for advanced care planning goals of care discussions. 9. Type 2 diabetes mellitus with hyperglycemia-carbohydrate modified diet no concentrated sweets or fruit juices, sliding scale insulin coverage and CBG testing ordered.  DVT prophylaxis: SCD Code Status: Full Family Communication: Patient updated with plan of care at bedside in ED no family present Disposition Plan: To be determined Consults called: Urology Dr. Alyson Ingles Admission status: Inpatient  Irwin Brakeman MD [facility] How to contact the [facility] Attending or Consulting provider [place] or covering provider during after hours [place], for this patient?  1. Check the care team in [facility] and look for a) attending/consulting TRH provider listed and b) the [facility] team listed 2. Log into [email or web address] and use [place]'s universal password to access. If you do not have the password, please contact the hospital operator. 3. Locate the [facility] provider you are looking for under [facility] and page to a number that you can be directly reached. 4. If you still have difficulty reaching the provider, please page the [facility] (Director on Call) for the Hospitalists listed on amion for assistance.  If 7PM-7AM, please contact  night-coverage [email or web address] Password [facility]  12/22/2019, 7:42 AM

## 2019-12-22 NOTE — TOC Initial Note (Signed)
Transition of Care [facility]) - Initial/Assessment Note   Patient Details  Name: Anthony Dunlap MRN: [id number] Date of Birth: 11/03/1933  Transition of Care [facility]) CM/SW Contact:    Sherie Don, LCSW Phone Number: 12/22/2019, 12:55 PM  Clinical Narrative: Patient is an 86 year old male who was admitted for hematuria. Patient has a history of type 2 diabetes mellitus, HLD, hypothyroidism, right total hip arthroplasty, typical atrial flutter, thrombocytosis, left BKA, and PAD. Readmission checklist completed due to high readmission score. CSW completed assessment with patient. Per patient, he resides at home with family members. Patient reported he is able to complete his ADLs with some assistance. Patient is able to afford his medications monthly and takes these as prescribed. Patient confirmed he is still active with [facility] for [facility] CSW updated by Georgina Snell with [name] patient is active for PT, OT, and RN. TOC to follow.  Expected Discharge Plan: [place] Barriers to Discharge: Continued Medical Work up  Patient Goals and CMS Choice Patient states their goals for this hospitalization and ongoing recovery are:: Discharge home CMS Medicare.gov Compare Post Acute Care list provided to:: Patient Choice offered to / list presented to : Patient  Expected Discharge Plan and Services Expected Discharge Plan: [place] In-house Referral: Clinical Social Work Discharge Planning Services: NA Post Acute Care Choice: [place] arrangements for the past 2 months: [place] Arranged: RN, PT, OT HH Agency: [place] Date [place]: 12/22/19 Time [place]: [place] Representative spoke with at [place]: Georgina Snell  Prior Living Arrangements/Services Living arrangements for the past 2 months: [place] with:: Adult Children Patient language and need for interpreter reviewed:: Yes Do you feel safe going back  to the place where you live?: Yes      Need for Family Participation in Patient Care: Yes (Comment) Care giver support system in place?: Yes (comment) Current home services: DME, Home OT, Home PT, Home RN (Hospital bed, walker, 3N1, wheelchair) Criminal Activity/Legal Involvement Pertinent to Current Situation/Hospitalization: No - Comment as needed  Activities of Daily Living Home Assistive Devices/Equipment: Environmental consultant (specify type), Wheelchair ADL Screening (condition at time of admission) Patient's cognitive ability adequate to safely complete daily activities?: Yes Is the patient deaf or have difficulty hearing?: No Does the patient have difficulty seeing, even when wearing glasses/contacts?: No Does the patient have difficulty concentrating, remembering, or making decisions?: No Patient able to express need for assistance with ADLs?: Yes Does the patient have difficulty dressing or bathing?: No Independently performs ADLs?: Yes (appropriate for developmental age) Does the patient have difficulty walking or climbing stairs?: Yes Weakness of Legs: Both Weakness of Arms/Hands: None  Permission Sought/Granted Permission sought to share information with : Facility Sport and exercise psychologist, Family Supports Permission granted to share information with : Yes, Verbal Permission Granted Permission granted to share info w AGENCY: [name]  Emotional Assessment Appearance:: Appears stated age Attitude/Demeanor/Rapport: Engaged Affect (typically observed): Accepting Orientation: : Oriented to Self, Oriented to Place, Oriented to Situation, Oriented to  Time Alcohol / Substance Use: Not Applicable Psych Involvement: No (comment)  Admission diagnosis:  Blood loss anemia [D50.0] Gross hematuria [R31.0] Hematuria [R31.9] Patient Active Problem List   Diagnosis Date Noted  . Hematuria 12/22/2019  . Acute blood loss anemia 12/22/2019  . Gross hematuria 12/16/2019  . Urinary retention 12/05/2019   . Hx of BKA, left ([place]) 12/05/2019  . PAD (peripheral artery disease) ([place]) 12/05/2019  .  Decubitus ulcer of sacral region, stage 2 ([place]) 12/02/2019  . Hyperglycemia due to diabetes mellitus ([place]) 12/02/2019  . Leukocytosis 12/02/2019  . Thrombocytosis 12/02/2019  . Hypoalbuminemia 12/02/2019  . Prolonged QT interval 12/02/2019  . Typical atrial flutter ([place])   . Atrial fibrillation with RVR ([place])   . Acute osteomyelitis of left calcaneus ([name])   . Sepsis ([place]) 11/05/2019  . Chronic heel ulcer, left, limited to breakdown of skin ([place]) 10/28/2019  . Acute osteomyelitis of calcaneum, right ([place]) 09/25/2019  . Critical lower limb ischemia ([place]) 08/11/2019  . Hx of total hip arthroplasty, right 08/06/2019  . Arthritis of right hip 06/12/2019  . Pelvic abscess in male [facility]) 09/11/2018  . Drug-induced hepatitis 06/05/2018  . Calculus of gallbladder without cholecystitis without obstruction   . Fungemia/Candida albicans 05/10/2018  . UTI (urinary tract infection) 05/08/2018  . Pseudomonas urinary tract infection 05/08/2018  . 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 ([place]) 04/21/2018  . Abscess of left thigh   . Pressure ulcer 02/10/2018  . Enterococcus faecalis infection 01/27/2018  . Abscess   . Psoas abscess ([place]) 01/23/2018  . Sepsis due to Bacteroides species ([place]) 01/22/2018  . Sepsis without acute organ dysfunction ([place])   . DKA (diabetic ketoacidoses) 01/17/2018  . Normocytic anemia 01/17/2018  . Chronic anemia 01/03/2018  . Chronic indwelling Foley catheter 01/03/2018  . Elevated alkaline phosphatase level 01/03/2018  . Hypothyroidism 01/03/2018  . Physical deconditioning 01/03/2018  . 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 HLD (hyperlipidemia) 08/19/2013  . Type 2 diabetes mellitus ([place])   . Prostate cancer [facility])    PCP:  [name], MD Pharmacy:   [facility], [address] AT [place].  [place] [address] Phone: [phone] Fax: [phone]  Readmission Risk Interventions Readmission Risk Prevention Plan 12/22/2019 11/13/2019 09/29/2019  Transportation Screening Complete Complete Complete  PCP or Specialist Appt within 5-7 Days - - Not Complete  Not Complete comments - - plan for SNF  PCP or Specialist Appt within 3-5 Days - Complete -  Home Care Screening - - Complete  Medication Review (RN CM) - - Referral to Pharmacy  [place] or [place] - Complete -  Social Work Consult for [place] Planning/Counseling - Complete -  Palliative Care Screening - Not Applicable -  Medication Review Press photographer) Complete Complete -  [place] or Home Care Consult Complete - -  SW Recovery Care/Counseling Consult Complete - -  Palliative Care Screening Not Complete - -  Comments Consult has been placed; palliative to see - -  [place] Complete - -  Some recent data might be hidden

## 2019-12-22 NOTE — Progress Notes (Signed)
CBI restarted due to blood tinged urine with clots.

## 2019-12-23 DIAGNOSIS — N35911 Unspecified urethral stricture, male, meatal: Secondary | ICD-10-CM

## 2019-12-23 DIAGNOSIS — Z7189 Other specified counseling: Secondary | ICD-10-CM

## 2019-12-23 DIAGNOSIS — I739 Peripheral vascular disease, unspecified: Secondary | ICD-10-CM

## 2019-12-23 DIAGNOSIS — R310 Gross hematuria: Secondary | ICD-10-CM

## 2019-12-23 DIAGNOSIS — D62 Acute posthemorrhagic anemia: Secondary | ICD-10-CM

## 2019-12-23 DIAGNOSIS — R5381 Other malaise: Secondary | ICD-10-CM

## 2019-12-23 DIAGNOSIS — Z515 Encounter for palliative care: Secondary | ICD-10-CM

## 2019-12-23 LAB — TYPE AND SCREEN
ABO/RH(D): O POS
Antibody Screen: NEGATIVE
Unit division: 0

## 2019-12-23 LAB — GLUCOSE, CAPILLARY
Glucose-Capillary: 66 mg/dL — ABNORMAL LOW (ref 70–99)
Glucose-Capillary: 103 mg/dL — ABNORMAL HIGH (ref 70–99)
Glucose-Capillary: 93 mg/dL (ref 70–99)
Glucose-Capillary: 209 mg/dL — ABNORMAL HIGH (ref 70–99)
Glucose-Capillary: 170 mg/dL — ABNORMAL HIGH (ref 70–99)
Glucose-Capillary: 87 mg/dL (ref 70–99)

## 2019-12-23 LAB — COMPREHENSIVE METABOLIC PANEL
ALT: 10 U/L (ref 0–44)
AST: 14 U/L — ABNORMAL LOW (ref 15–41)
Albumin: 2.5 g/dL — ABNORMAL LOW (ref 3.5–5.0)
Alkaline Phosphatase: 119 U/L (ref 38–126)
Anion gap: 9 (ref 5–15)
BUN: 10 mg/dL (ref 8–23)
CO2: 24 mmol/L (ref 22–32)
Calcium: 8.1 mg/dL — ABNORMAL LOW (ref 8.9–10.3)
Chloride: 106 mmol/L (ref 98–111)
Creatinine, Ser: 0.64 mg/dL (ref 0.61–1.24)
GFR, Estimated: >60 mL/min (ref >60)
Glucose, Bld: 72 mg/dL (ref 70–99)
Potassium: 2.7 mmol/L — CL (ref 3.5–5.1)
Sodium: 139 mmol/L (ref 135–145)
Total Bilirubin: 0.3 mg/dL (ref 0.3–1.2)
Total Protein: 6.3 g/dL — ABNORMAL LOW (ref 6.5–8.1)

## 2019-12-23 LAB — BPAM RBC
Blood Product Expiration Date: 202111292359
ISSUE DATE / TIME: 202111020746
Unit Type and Rh: 9500

## 2019-12-23 LAB — CBC
HCT: 28.2 % — ABNORMAL LOW (ref 39.0–52.0)
Hemoglobin: 8.2 g/dL — ABNORMAL LOW (ref 13.0–17.0)
MCH: 24.8 pg — ABNORMAL LOW (ref 26.0–34.0)
MCHC: 29.1 g/dL — ABNORMAL LOW (ref 30.0–36.0)
MCV: 85.2 fL (ref 80.0–100.0)
Platelets: 555 10*3/uL — ABNORMAL HIGH (ref 150–400)
RBC: 3.31 MIL/uL — ABNORMAL LOW (ref 4.22–5.81)
RDW: 17.1 % — ABNORMAL HIGH (ref 11.5–15.5)
WBC: 10.1 10*3/uL (ref 4.0–10.5)
nRBC: 0 % (ref 0.0–0.2)

## 2019-12-23 LAB — MAGNESIUM: Magnesium: 1.6 mg/dL — ABNORMAL LOW (ref 1.7–2.4)

## 2019-12-23 LAB — POTASSIUM: Potassium: 3.4 mmol/L — ABNORMAL LOW (ref 3.5–5.1)

## 2019-12-23 MED ORDER — POTASSIUM CHLORIDE CRYS ER 20 MEQ PO TBCR
40.0000 meq | EXTENDED_RELEASE_TABLET | Freq: Two times a day (BID) | ORAL | Status: DC
  Administered 2019-12-23 – 2019-12-25 (×5): 40 meq via ORAL
  Filled 2019-12-23 (×5): qty 2

## 2019-12-23 MED ORDER — MAGNESIUM SULFATE 2 GM/50ML IV SOLN
2.0000 g | Freq: Once | INTRAVENOUS | Status: AC
  Administered 2019-12-23: 2 g via INTRAVENOUS
  Filled 2019-12-23: qty 50

## 2019-12-23 NOTE — Plan of Care (Signed)
  Problem: Acute Rehab PT Goals(only PT should resolve) Goal: Pt will Roll Supine to Side Outcome: Progressing Flowsheets (Taken 12/23/2019 1557) Pt will Roll Supine to Side:  with supervision  with min assist Goal: Pt Will Go Supine/Side To Sit Outcome: Progressing Flowsheets (Taken 12/23/2019 1557) Pt will go Supine/Side to Sit: with minimal assist Goal: Pt Will Go Sit To Supine/Side Outcome: Progressing Flowsheets (Taken 12/23/2019 1557) Pt will go Sit to Supine/Side: with minimal assist Goal: Patient Will Perform Sitting Balance Outcome: Progressing Flowsheets (Taken 12/23/2019 1557) Patient will perform sitting balance: with modified independence Goal: Pt Will Transfer Bed To Chair/Chair To Bed Outcome: Progressing Flowsheets (Taken 12/23/2019 1557) Pt will Transfer Bed to Chair/Chair to Bed:  with mod assist  with max assist   3:58 PM, 12/23/19 Lonell Grandchild, MPT Physical Therapist with [facility] [phone] office [phone] mobile phone

## 2019-12-23 NOTE — Progress Notes (Signed)
PMT consult received and chart reviewed. Met with patient to discuss goals of care. Also spoke with daughter, [name] via telephone. Introduced AD packet and MOST form. Encouraged family discussions regarding his goals/EOL wishes including recommendation for limitations to care (DNR/DNI) but continuing medical management and treating treatable conditions. During visit, patient states "I wouldn't want to be beat on" and "if it quit, it quits." Daughter plans to further discuss with her father and two sisters before decisions are made. She is unavailable to meet with PMT provider tomorrow. May benefit from outpatient palliative referral.   Full palliative note to follow.   NO CHARGE  Ihor Dow, [place], FNP-C Palliative Medicine Team  Phone: [phone] Fax: [phone]

## 2019-12-23 NOTE — Progress Notes (Signed)
Urine noted to be light pink-tinged in foley. CBI remains off. No bladder distention or tenderness noted on palpation of lower abd. Pt eating supper at this time, no c/o.

## 2019-12-23 NOTE — Progress Notes (Signed)
CRITICAL VALUE ALERT  Critical Value:  K: 2.7  Date & Time Notied:  12/23/19 @ 6384  Provider Notified: Ortiz,MD  Orders Received/Actions taken: awaiting orders.

## 2019-12-23 NOTE — Progress Notes (Signed)
CBI stoipped by MD McKenzie. Will monitor for s/s hematuria.

## 2019-12-23 NOTE — Progress Notes (Signed)
Subjective: Patient reports no suprapubic pain. Urine is clear on very slow drip CBI  Objective: Vital signs in last 24 hours: Temp:  [97.4 F (36.3 C)-98.7 F (37.1 C)] 98.7 F (37.1 C) (11/03 1430) Pulse Rate:  [80-91] 88 (11/03 1430) Resp:  [16-18] 17 (11/03 1430) BP: (112-132)/(47-74) 128/71 (11/03 1430) SpO2:  [98 %-100 %] 98 % (11/03 0522)  Intake/Output from previous day: 11/02 0701 - 11/03 0700 In: 835.9 [Blood:692.1; IV Piggyback:143.8] Out: 26050 [Urine:26050] Intake/Output this shift: Total I/O In: -  Out: 4875 [Urine:4875]  Physical Exam:  General:alert, cooperative and appears stated age GI: soft, non tender, normal bowel sounds, no palpable masses, no organomegaly, no inguinal hernia Male genitalia: not done Extremities: extremities normal, atraumatic, no cyanosis or edema  Lab Results: Recent Labs    12/22/19 0316 12/23/19 0452  HGB 6.7* 8.2*  HCT 22.6* 28.2*   BMET Recent Labs    12/22/19 0316 12/23/19 0452  NA 139 139  K 3.3* 2.7*  CL 101 106  CO2 26 24  GLUCOSE 143* 72  BUN 14 10  CREATININE 0.86 0.64  CALCIUM 7.9* 8.1*   No results for input(s): LABPT, INR in the last 72 hours. No results for input(s): LABURIN in the last 72 hours. Results for orders placed or performed during the hospital encounter of 12/22/19  Urine culture     Status: Abnormal (Preliminary result)   Collection Time: 12/22/19  3:58 AM   Specimen: Urine, Catheterized  Result Value Ref Range Status   Specimen Description   Final    URINE, CATHETERIZED Performed at [facility], [address], [place], [place] [address]    Special Requests   Final    NONE Performed at [facility], [address], [place], [place] [address]    Culture (A)  Final    20,000 COLONIES/mL ENTEROCOCCUS FAECIUM SUSCEPTIBILITIES TO FOLLOW Performed at [facility], [place] [address], [place],  [address]    Report Status PENDING  Incomplete  Respiratory Panel by RT PCR (Flu  A&B, Covid) - Nasopharyngeal Swab     Status: None   Collection Time: 12/22/19  4:27 AM   Specimen: Nasopharyngeal Swab  Result Value Ref Range Status   SARS Coronavirus 2 by RT PCR NEGATIVE NEGATIVE Final    Comment: (NOTE) SARS-CoV-2 target nucleic acids are NOT DETECTED.  The SARS-CoV-2 RNA is generally detectable in upper respiratoy specimens during the acute phase of infection. The lowest concentration of SARS-CoV-2 viral copies this assay can detect is 131 copies/mL. A negative result does not preclude SARS-Cov-2 infection and should not be used as the sole basis for treatment or other patient management decisions. A negative result may occur with  improper specimen collection/handling, submission of specimen other than nasopharyngeal swab, presence of viral mutation(s) within the areas targeted by this assay, and inadequate number of viral copies (<131 copies/mL). A negative result must be combined with clinical observations, patient history, and epidemiological information. The expected result is Negative.  Fact Sheet for Patients:  PinkCheek.be  Fact Sheet for Healthcare Providers:  GravelBags.it  This test is no t yet approved or cleared by the [place] FDA and  has been authorized for detection and/or diagnosis of SARS-CoV-2 by FDA under an Emergency Use Authorization (EUA). This EUA will remain  in effect (meaning this test can be used) for the duration of the COVID-19 declaration under Section 564(b)(1) of the Act, 21 U.S.C. section 360bbb-3(b)(1), unless the authorization is terminated or revoked  sooner.     Influenza A by PCR NEGATIVE NEGATIVE Final   Influenza B by PCR NEGATIVE NEGATIVE Final    Comment: (NOTE) The Xpert Xpress SARS-CoV-2/FLU/RSV assay is intended as an aid in  the diagnosis of influenza from Nasopharyngeal swab specimens and  should not be used as a sole basis for treatment. Nasal  washings and  aspirates are unacceptable for Xpert Xpress SARS-CoV-2/FLU/RSV  testing.  Fact Sheet for Patients: PinkCheek.be  Fact Sheet for Healthcare Providers: GravelBags.it  This test is not yet approved or cleared by the [place] FDA and  has been authorized for detection and/or diagnosis of SARS-CoV-2 by  FDA under an Emergency Use Authorization (EUA). This EUA will remain  in effect (meaning this test can be used) for the duration of the  Covid-19 declaration under Section 564(b)(1) of the Act, 21  U.S.C. section 360bbb-3(b)(1), unless the authorization is  terminated or revoked. Performed at [facility], [address], [place], [place] [address]     Studies/Results: No results found.  Assessment/Plan: 86yo with gross hematuria, improving  1. CBI discontinued this afternoon and urine remains clear. He should continue his indwelling foley. Antifungal treatment per pharmacy.    LOS: 1 day   Nicolette Bang 12/23/2019, 4:28 PM

## 2019-12-23 NOTE — Progress Notes (Signed)
CBG 87. Notified M. Sharlet Salina to make aware, new order to hold Lantus 15 units.

## 2019-12-23 NOTE — Evaluation (Signed)
Physical Therapy Evaluation Patient Details Name: Anthony Dunlap MRN: [id number] DOB: 1933/08/28 Today's Date: 12/23/2019   History of Present Illness  RICKARDO BRINEGAR is a 86 y.o. male with medical history significant for chronic atrial fibrillation fully anticoagulated with apixaban, severe peripheral vascular disease on Plavix, chronic urinary retention with a chronic indwelling Foley catheter, recently status post bilateral BKA, hypothyroidism, generalized anxiety disorder, history of prostate cancer, hyperlipidemia, gout who has been followed closely by his urologist Dr. Alyson Ingles.  He presented last week with complaints of blood in the urine.  This was thought to be secondary to a UTI and he had been placed on a course of Cefpodoxime.  His cultures have now grown out yeast infection.  Patient presented to the emergency department because he had no urine output from his Foley in over 24 hours.  After the Foley was irrigated there were multiple blood clots blocking Foley output and he was noted to have significant gross hematuria.  His hemoglobin had come down to 6.9.  Neurology was consulted in the emergency department and Dr. [name] recommended starting continuous bladder irrigation.  The patient was typed and crossed and transfused 1 unit packed red blood cells.  Hospital admission was requested for further management.    Clinical Impression  Patient demonstrates slow labored movement for sitting up at bedside requiring use of bed rail and Mod assist, has to use BUE to maintain sitting balance most of time, falls backwards when attempting dynamic activities without BUE support.  Patient unable to move when attempting to laterally scoot due to weakness, demonstrates good return for using BUE to help pull self up in bed.  Patient will benefit from continued physical therapy in hospital and recommended venue below to increase strength, balance, endurance for safe ADLs and gait.     Follow Up  Recommendations SNF;Supervision for mobility/OOB;Supervision - Intermittent    Equipment Recommendations  None recommended by PT    Recommendations for Other Services       Precautions / Restrictions Precautions Precautions: Fall Precaution Comments: R BKA, 9/22 L BKA Restrictions LLE Weight Bearing: Non weight bearing      Mobility  Bed Mobility Overal bed mobility: Needs Assistance Bed Mobility: Supine to Sit;Sit to Supine;Rolling Rolling: Min assist;Min guard   Supine to sit: Mod assist Sit to supine: Min assist;Mod assist   General bed mobility comments: increased time, labored movement    Transfers Overall transfer level: Needs assistance   Transfers: Lateral/Scoot Transfers          Lateral/Scoot Transfers: Max assist General transfer comment: unable to move due to BUE weakness  Ambulation/Gait                Stairs            Wheelchair Mobility    Modified Rankin (Stroke Patients Only)       Balance Overall balance assessment: Needs assistance Sitting-balance support: Feet unsupported;Bilateral upper extremity supported Sitting balance-Leahy Scale: Fair Sitting balance - Comments: seated at EOB with BUE support, fair/poor without BUE support                                     Pertinent Vitals/Pain Pain Assessment: No/denies pain    Home Living Family/patient expects to be discharged to:: Private residence Living Arrangements: Children Available Help at Discharge: Personal care attendant;Available PRN/intermittently Type of Home: House Home Access: Ramped entrance  Home Layout: One level Home Equipment: Bedside commode;Shower seat;Walker - 2 wheels;Cane - single point;Walker - 4 wheels;Grab bars - tub/shower Additional Comments: Patient's wife recently passed away. Per chart review has been staying with daughter since discharge from [facility] on [facility].    Prior Function Level of Independence: Needs assistance    Gait / Transfers Assistance Needed: asisted sliding board transfers  ADL's / Homemaking Assistance Needed: Pt requiring assistance with ADLs, completing seated UB ADLs with min assist, max assist for LB ADLs  Comments: was able to use sliding board to get to wheelchair in rehab     Hand Dominance   Dominant Hand: Right    Extremity/Trunk Assessment   Upper Extremity Assessment Upper Extremity Assessment: Generalized weakness    Lower Extremity Assessment Lower Extremity Assessment: Generalized weakness    Cervical / Trunk Assessment Cervical / Trunk Assessment: Normal  Communication   Communication: No difficulties  Cognition Arousal/Alertness: Awake/alert Behavior During Therapy: WFL for tasks assessed/performed Overall Cognitive Status: Within Functional Limits for tasks assessed                                        General Comments      Exercises     Assessment/Plan    PT Assessment Patient needs continued PT services  PT Problem List Decreased strength;Decreased activity tolerance;Decreased balance;Decreased mobility       PT Treatment Interventions Balance training;Functional mobility training;Therapeutic activities;Therapeutic exercise;Patient/family education;Wheelchair mobility training;DME instruction    PT Goals (Current goals can be found in the Care Plan section)  Acute Rehab PT Goals Patient Stated Goal: return home with family/home aides to assist PT Goal Formulation: With patient Time For Goal Achievement: 01/06/20 Potential to Achieve Goals: Good    Frequency Min 3X/week   Barriers to discharge        Co-evaluation               AM-PAC PT "6 Clicks" Mobility  Outcome Measure Help needed turning from your back to your side while in a flat bed without using bedrails?: A Little Help needed moving from lying on your back to sitting on the side of a flat bed without using bedrails?: A Lot Help needed moving to and  from a bed to a chair (including a wheelchair)?: Total Help needed standing up from a chair using your arms (e.g., wheelchair or bedside chair)?: Total Help needed to walk in hospital room?: Total Help needed climbing 3-5 steps with a railing? : Total 6 Click Score: 9    End of Session   Activity Tolerance: Patient tolerated treatment well;Patient limited by fatigue Patient left: in bed;with call bell/phone within reach Nurse Communication: Mobility status PT Visit Diagnosis: Other abnormalities of gait and mobility (R26.89);Muscle weakness (generalized) (M62.81);Difficulty in walking, not elsewhere classified (R26.2)    Time: 1444-1510 PT Time Calculation (min) (ACUTE ONLY): 26 min   Charges:   PT Evaluation $PT Eval Moderate Complexity: 1 Mod PT Treatments $Therapeutic Activity: 23-37 mins        3:56 PM, 12/23/19 Lonell Grandchild, MPT Physical Therapist with [facility] [phone] office [phone] mobile phone

## 2019-12-23 NOTE — Progress Notes (Signed)
PROGRESS NOTE    Anthony Dunlap  YCX:[id number] DOB: [birth year] DOA: 12/22/2019 PCP: [name], MD    Chief Complaint  Patient presents with   Urinary Retention    Brief Narrative:  86 year old gentleman prior history of chronic atrial fibrillation, fully anticoagulated with apixaban, peripheral vascular disease on Plavix, chronic urinary retention s/p chronic Foley catheter, bilateral BKA, hypothyroidism, history of prostate cancer, hyperlipidemia presented with hematuria.  He was recently diagnosed with a UTI and has been placed on a course of Cefpodoxime.  Urine cultures are growing yeast at this time.  Urology consulted and he was placed on CBI.  Patient also underwent 1 unit of PRBC transfusion due to blood loss anemia from hematuria. Patient seen and examined this morning his urine has cleared up with CBI.  Urology discontinued the CBI and recommended for continue with indwelling Foley catheter.   Assessment & Plan:   Active Problems:   Type 2 diabetes mellitus ([name])   Sixth nerve palsy of left eye   HLD (hyperlipidemia)   Elevated alkaline phosphatase level   Hypothyroidism   Physical deconditioning   UTI (urinary tract infection)   Hx of total hip arthroplasty, right   Typical atrial flutter ([name])   Hyperglycemia due to diabetes mellitus ([name])   Thrombocytosis   Hypoalbuminemia   Urinary retention   Hx of BKA, left ([name])   PAD (peripheral artery disease) ([name])   Gross hematuria   Hematuria   Acute blood loss anemia   Gross hematuria Possibly secondary to a urinary tract infection.  Has resolved with CBI.  Urology on board and appreciate recommendations. CBI was clamped and discontinued today. Urology recommends to continue with indwelling Foley catheter and continue with antifungal medications at this time.   Chronic atrial fibrillation Continue with amiodarone. Plan to resume Eliquis after discussing with Dr. Alyson Ingles.    Peripheral vascular  disease Hold Plavix on hold due to gross hematuria.   Acute blood loss anemia secondary to hematuria. S/p 1 unit of PRBC transfusion Repeat hemoglobin at 8.2 Transfuse to keep hemoglobin greater than 7.    Urinary retention s/p chronic indwelling Foley catheter Recommend outpatient follow-up with urology on discharge.   Hypokalemia and hypomagnesemia Replaced  Type 2 diabetes mellitus with hyperglycemia, . CBG (last 3)  Recent Labs    12/23/19 0722 12/23/19 1122 12/23/19 1619  GLUCAP 93 209* 170*   Resume sliding scale insulin at this time.   DVT prophylaxis: SCDs Code Status: Full code Family Communication: Discussed with family over the phone Disposition:   Status is: Inpatient  Remains inpatient appropriate because:Unsafe d/c plan and IV treatments appropriate due to intensity of illness or inability to take PO   Dispo: The patient is from: Home              Anticipated d/c is to: SNF              Anticipated d/c date is: 2 days              Patient currently is not medically stable to d/c.       Consultants:   urology  Procedures: none  Antimicrobials:  Antibiotics Given (last 72 hours)    Date/Time Action Medication Dose   12/22/19 0830 Given   cefdinir (OMNICEF) capsule 300 mg 300 mg   12/22/19 2131 Given   cefdinir (OMNICEF) capsule 300 mg 300 mg   12/23/19 1104 Given   cefdinir (OMNICEF) capsule 300 mg 300 mg  Subjective: No new complaints at this time  Objective: Vitals:   12/22/19 2127 12/22/19 2300 12/23/19 0522 12/23/19 0745  BP: 127/74 112/63 127/66 132/74  Pulse: 81 84 91 90  Resp:  18 16 16   Temp: (!) 97.4 F (36.3 C) (!) 97.5 F (36.4 C) 98.3 F (36.8 C) 97.9 F (36.6 C)  TempSrc: Oral Oral Oral Oral  SpO2: 100% 98% 98%   Weight:      Height:        Intake/Output Summary (Last 24 hours) at 12/23/2019 1053 Last data filed at 12/23/2019 0800 Gross per 24 hour  Intake 143.77 ml  Output 27700 ml  Net  -27556.23 ml   Filed Weights   12/22/19 0158 12/22/19 0204 12/22/19 1123  Weight: 74.8 kg 74.8 kg 70.4 kg    Examination:  General exam: Appears calm and comfortable  Respiratory system: Clear to auscultation. Respiratory effort normal. Cardiovascular system: S1 & S2 heard, RRR. No JVD,  No pedal edema. Gastrointestinal system: Abdomen is nondistended, soft and nontender. Normal bowel sounds heard. Central nervous system: Alert and oriented. No focal neurological deficits. Extremities:  Skin: No rashes,  Psychiatry: Mood & affect appropriate.     Data Reviewed: I have personally reviewed following labs and imaging studies  CBC: Recent Labs  Lab 12/22/19 0316 12/23/19 0452  WBC 7.6 10.1  NEUTROABS 4.7  --   HGB 6.7* 8.2*  HCT 22.6* 28.2*  MCV 84.0 85.2  PLT 596* 555*    Basic Metabolic Panel: Recent Labs  Lab 12/22/19 0316 12/23/19 0452  NA 139 139  K 3.3* 2.7*  CL 101 106  CO2 26 24  GLUCOSE 143* 72  BUN 14 10  CREATININE 0.86 0.64  CALCIUM 7.9* 8.1*  MG  --  1.6*    GFR: Estimated Creatinine Clearance: 66.5 mL/min (by C-G formula based on SCr of 0.64 mg/dL).  Liver Function Tests: Recent Labs  Lab 12/23/19 0452  AST 14*  ALT 10  ALKPHOS 119  BILITOT 0.3  PROT 6.3*  ALBUMIN 2.5*    CBG: Recent Labs  Lab 12/22/19 1606 12/22/19 2130 12/23/19 0427 12/23/19 0515 12/23/19 0722  GLUCAP 99 111* 66* 103* 93     Recent Results (from the past 240 hour(s))  Urine culture     Status: Abnormal   Collection Time: 12/14/19  2:05 PM   Specimen: Urine, Random  Result Value Ref Range Status   Specimen Description   Final    URINE, RANDOM Performed at [facility], [address] [place], [place] [address]    Special Requests   Final    NONE Performed at [facility], [address] [place], [place] [address]    Culture >=100,000 COLONIES/mL YEAST (A)  Final   Report Status 12/16/2019 FINAL  Final  Urine culture     Status: None (Preliminary  result)   Collection Time: 12/22/19  3:58 AM   Specimen: Urine, Catheterized  Result Value Ref Range Status   Specimen Description   Final    URINE, CATHETERIZED Performed at [facility], [address] [place], [place] [address]    Special Requests   Final    NONE Performed at [facility], [address], [place], [place] [address]    Culture   Final    CULTURE REINCUBATED FOR BETTER GROWTH Performed at [facility], [place] [address] [place], [place] [address]    Report Status PENDING  Incomplete  Respiratory Panel by RT PCR (Flu A&B,  Covid) - Nasopharyngeal Swab     Status: None   Collection Time: 12/22/19  4:27 AM   Specimen: Nasopharyngeal Swab  Result Value Ref Range Status   SARS Coronavirus 2 by RT PCR NEGATIVE NEGATIVE Final    Comment: (NOTE) SARS-CoV-2 target nucleic acids are NOT DETECTED.  The SARS-CoV-2 RNA is generally detectable in upper respiratoy specimens during the acute phase of infection. The lowest concentration of SARS-CoV-2 viral copies this assay can detect is 131 copies/mL. A negative result does not preclude SARS-Cov-2 infection and should not be used as the sole basis for treatment or other patient management decisions. A negative result may occur with  improper specimen collection/handling, submission of specimen other than nasopharyngeal swab, presence of viral mutation(s) within the areas targeted by this assay, and inadequate number of viral copies (<131 copies/mL). A negative result must be combined with clinical observations, patient history, and epidemiological information. The expected result is Negative.  Fact Sheet for Patients:  PinkCheek.be  Fact Sheet for Healthcare Providers:  GravelBags.it  This test is no t yet approved or cleared by the [place] FDA and  has been authorized for detection and/or diagnosis of SARS-CoV-2 by FDA under an Emergency Use  Authorization (EUA). This EUA will remain  in effect (meaning this test can be used) for the duration of the COVID-19 declaration under Section 564(b)(1) of the Act, 21 U.S.C. section 360bbb-3(b)(1), unless the authorization is terminated or revoked sooner.     Influenza A by PCR NEGATIVE NEGATIVE Final   Influenza B by PCR NEGATIVE NEGATIVE Final    Comment: (NOTE) The Xpert Xpress SARS-CoV-2/FLU/RSV assay is intended as an aid in  the diagnosis of influenza from Nasopharyngeal swab specimens and  should not be used as a sole basis for treatment. Nasal washings and  aspirates are unacceptable for Xpert Xpress SARS-CoV-2/FLU/RSV  testing.  Fact Sheet for Patients: PinkCheek.be  Fact Sheet for Healthcare Providers: GravelBags.it  This test is not yet approved or cleared by the [place] FDA and  has been authorized for detection and/or diagnosis of SARS-CoV-2 by  FDA under an Emergency Use Authorization (EUA). This EUA will remain  in effect (meaning this test can be used) for the duration of the  Covid-19 declaration under Section 564(b)(1) of the Act, 21  U.S.C. section 360bbb-3(b)(1), unless the authorization is  terminated or revoked. Performed at [facility], [address], [place], [place] [address]          Radiology Studies: No results found.      Scheduled Meds:  amiodarone  200 mg Oral Daily   cefdinir  300 mg Oral Q12H   Chlorhexidine Gluconate Cloth  6 each Topical Daily   ferrous sulfate  325 mg Oral Q breakfast   insulin aspart  0-15 Units Subcutaneous TID WC   insulin aspart  0-5 Units Subcutaneous QHS   insulin aspart  3 Units Subcutaneous TID WC   insulin glargine  15 Units Subcutaneous QHS   levothyroxine  100 mcg Oral Q breakfast   metoprolol succinate  100 mg Oral Daily   multivitamin with minerals  1 tablet Oral Daily   potassium chloride  40 mEq Oral BID    pravastatin  10 mg Oral Q supper   tamsulosin  0.4 mg Oral Daily   Continuous Infusions:  sodium chloride     anidulafungin     magnesium sulfate bolus IVPB     sodium chloride irrigation Stopped (12/22/19 1454)  LOS: 1 day       Hosie Poisson, MD [facility]   To contact the attending provider between 7A-7P or the covering provider during after hours 7P-7A, please log into the web site [email or web address] and access using universal [place] password for that web site. If you do not have the password, please call the hospital operator.  12/23/2019, 10:53 AM

## 2019-12-23 NOTE — Consult Note (Signed)
Consultation Note Date: 12/23/19  Patient Name: Anthony Dunlap  DOB: Feb 01, 1934  MRN: [id number]  Age / Sex: 86 y.o., male  PCP: [name], MD Referring Physician: Hosie Poisson, MD  Reason for Consultation: Establishing goals of care  HPI/Patient Profile: 86 y.o. male  with past medical history of chronic atrial fibrillation on apixaban, PVD, chronic urinary retention s/p chronic foley, recent bilateral BKA's, hypothyroidism, prostate cancer, HLD admitted on 12/22/2019 with hematuria. Recent diagnosis of UTI. Urine cultures growing yeast. Urology consulted and placed on CBI. S/p 1 unit PRBC. Palliative medicine consultation for goals of care/advanced directives.    Clinical Assessment and Goals of Care:  I have reviewed medical records, discussed with care team, and met with patient at bedside to discuss goals of care. Anthony Dunlap is awake, alert, oriented and able to participate in conversation. No family at bedside.   I introduced Palliative Medicine as specialized medical care for people living with serious illness. It focuses on providing relief from the symptoms and stress of a serious illness. The goal is to improve quality of life for both the patient and the family.  We discussed a brief life review of the patient. Recently widowed 2 months ago. Three supportive daughters. Lives home alone but since recent BKA's, has had 24/7 caregiver support. Chronic foley ~2 years per patient report.   Discussed events leading up to admission and course of hospitalization including diagnoses, interventions, plan of care.   I attempted to elicit values and goals of care important to the patient. He is eager to get back home when stable. Ultimately, he is hopeful to walk again if prosthetics are ordered.   Advanced directives, concepts specific to code status, artifical feeding and hydration were discussed. Patient  reports he has a documented living will but with wife (recently passed) as POA. Encouraged review and updating living will/POA documentation with daughters as POA.   Explored patient's thoughts on cpr/life support and medical recommendation against so with underlying age, frailty, and chronic conditions. Patient states "if it quit, it quits" and "I wouldn't want to be beat on." Introduced and discussed AD packet and MOST form. Encouraged limitations to care (DNR/DNI) and concept of medical management/treating the treatable. Documentation left at bedside for him to review with his daughters. Patient requests I call daughter, Anthony Dunlap to discuss.   Answered questions. PMT contact information given.  **Shortly after, spoke with daughter Anthony Dunlap) via telephone. Introduced role of palliative medicine. Provided update on diagnoses, interventions, plan of care. Shared my conversation with her father regarding advanced directives and code status, including his consideration for DNR/DNI. Reviewed AD packet and MOST form. Offered to meet tomorrow to discuss and consider completing documentation. Anthony Dunlap is unavailable to meet tomorrow when PMT provider available. Anthony Dunlap understands the importance of family discussion regarding his goals and wishes. She plans to further discuss AD packet and MOST form with her father and two sisters. Answered questions.    SUMMARY OF RECOMMENDATIONS    Continue full code/full scope treatment.  Daughter unavailable  to meet with PMT provider but understands importance of discussing AD packet and MOST form with her father. Patient considering limitations to care (DNR/DNI) and speaks of not wanting to be "beat on." Needs further discussions with daughters before decisions are made. AD packet and MOST form left at bedside for daughter.   Continue current plan of care and medical management.   Patient has 24/7 home caregiver support. Likely will decline SNF placement.   May benefit  from outpatient palliative referral.   Code Status/Advance Care Planning:  Full code  Symptom Management:   Per attending  Palliative Prophylaxis:   Aspiration, Bowel Regimen, Delirium Protocol, Frequent Pain Assessment, Oral Care and Turn Reposition  Psycho-social/Spiritual:   Desire for further Chaplaincy support: yes  Additional Recommendations: Caregiving  Support/Resources  Prognosis:   Unable to determine  Discharge Planning: To Be Determined      Primary Diagnoses: Present on Admission: . Hematuria . UTI (urinary tract infection) . Urinary retention . Typical atrial flutter ([place]) . Thrombocytosis . Sixth nerve palsy of left eye . PAD (peripheral artery disease) ([place]) . Acute blood loss anemia . Physical deconditioning . Hypothyroidism . Hypoalbuminemia . Hyperglycemia due to diabetes mellitus ([place]) . HLD (hyperlipidemia) . Gross hematuria . Elevated alkaline phosphatase level   I have reviewed the medical record, interviewed the patient and family, and examined the patient. The following aspects are pertinent.  Past Medical History:  Diagnosis Date  . Anxiety   . Arthritis   . Atrial fibrillation ([place])   . Chronic heel ulcer, left, limited to breakdown of skin ([facility]) 10/28/2019  . 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 ([place]) 04/21/2018  . Diabetes ([place])   . Drug-induced hepatitis 06/05/2018  . Gout   . High cholesterol   . Hypothyroidism   . Inguinal hernia    06/09/2019: per patient " a long time ago on the right side"  . Left eye pain   . Localized osteoarthrosis of right hip 01/05/2019  . Prostate cancer ([place])   . Prostate cancer [facility])    Social History   Socioeconomic History  . Marital status: Widowed    Spouse name: Anthony Dunlap  . Number of children: 3  . Years of education: 9th  . Highest education level: Not on file  Occupational History    Employer: RETIRED    Comment: Retired  Tobacco Use  . Smoking status: Never  Smoker  . Smokeless tobacco: Never Used  Vaping Use  . Vaping Use: Never used  Substance and Sexual Activity  . Alcohol use: No  . Drug use: No  . Sexual activity: Not on file  Other Topics Concern  . Not on file  Social History Narrative   Patient lives at home with his wife. Anthony Dunlap) . Patient is retired.   Education 9th grade.   Right handed.   Caffeine None   Social Determinants of Health   Financial Resource Strain:   . Difficulty of Paying Living Expenses: Not on file  Food Insecurity:   . Worried About Charity fundraiser in the Last Year: Not on file  . Ran Out of Food in the Last Year: Not on file  Transportation Needs:   . Lack of Transportation (Medical): Not on file  . Lack of Transportation (Non-Medical): Not on file  Physical Activity:   . Days of Exercise per Week: Not on file  . Minutes of Exercise per Session: Not on file  Stress:   . Feeling of Stress :  Not on file  Social Connections:   . Frequency of Communication with Friends and Family: Not on file  . Frequency of Social Gatherings with Friends and Family: Not on file  . Attends Religious Services: Not on file  . Active Member of Clubs or Organizations: Not on file  . Attends Archivist Meetings: Not on file  . Marital Status: Not on file   Family History  Problem Relation Age of Onset  . Pneumonia Father   . Cancer Sister    Scheduled Meds: . amiodarone  200 mg Oral Daily  . cefdinir  300 mg Oral Q12H  . Chlorhexidine Gluconate Cloth  6 each Topical Daily  . ferrous sulfate  325 mg Oral Q breakfast  . insulin aspart  0-15 Units Subcutaneous TID WC  . insulin aspart  0-5 Units Subcutaneous QHS  . insulin aspart  3 Units Subcutaneous TID WC  . insulin glargine  10 Units Subcutaneous QHS  . levothyroxine  100 mcg Oral Q breakfast  . metoprolol succinate  100 mg Oral Daily  . multivitamin with minerals  1 tablet Oral Daily  . potassium chloride  40 mEq Oral BID  . pravastatin  10  mg Oral Q supper  . tamsulosin  0.4 mg Oral Daily   Continuous Infusions: . sodium chloride    . anidulafungin 100 mg (12/24/19 1146)  . sodium chloride irrigation Stopped (12/22/19 1454)   PRN Meds:.acetaminophen **OR** acetaminophen, ALPRAZolam, oxyCODONE, senna-docusate Medications Prior to Admission:  Prior to Admission medications   Medication Sig Start Date End Date Taking? Authorizing Provider  acetaminophen (TYLENOL) 500 MG tablet Take 1,000 mg by mouth every 6 (six) hours as needed for mild pain or moderate pain.   Yes [provider]  ALPRAZolam (XANAX) 0.5 MG tablet Take 1 tablet (0.5 mg total) by mouth 2 (two) times daily as needed for anxiety. Patient taking differently: Take 0.5 mg by mouth at bedtime.  09/30/19  Yes Geradine Girt, DO  amiodarone (PACERONE) 200 MG tablet Take 1 tablet (200 mg total) by mouth 2 (two) times daily for 14 days. Amiodarone $RemoveBeforeDE'200mg'rpAZsDbLFXvxeRm$  BID for 14 days and then once daily. Patient taking differently: Take 200 mg by mouth at bedtime.  11/26/19 12/22/19 Yes Humphrey Rolls, Mohammad Z, DO  amitriptyline (ELAVIL) 10 MG tablet Take 20 mg by mouth at bedtime.  11/28/18  Yes [provider]  apixaban (ELIQUIS) 5 MG TABS tablet Take 1 tablet (5 mg total) by mouth 2 (two) times daily. 11/15/19  Yes Antonieta Pert, MD  cefpodoxime (VANTIN) 200 MG tablet Take 1 tablet (200 mg total) by mouth 2 (two) times daily. 12/16/19  Yes McKenzie, Candee Furbish, MD  clopidogrel (PLAVIX) 75 MG tablet Take 1 tablet (75 mg total) by mouth daily. 08/19/19 08/18/20 Yes Marty Heck, MD  colchicine 0.6 MG tablet Take 0.6 mg by mouth daily as needed (gout attacks).  07/27/19  Yes [provider]  ferrous sulfate 325 (65 FE) MG tablet Take 1 tablet (325 mg total) by mouth 2 (two) times daily with a meal. 09/30/19  Yes Vann, Jessica U, DO  insulin aspart (NOVOLOG) 100 UNIT/ML injection Inject 0-15 Units into the skin 3 (three) times daily with meals. Sliding scale insulin Less  than 70 initiate hypoglycemia protocol 70-120  0 units 120-150 2 unit 151-200 3 units 201-250 3 units 251-300 5 units 301-350 8 units 351-400 11 units  Greater than 400 15 units , call MD 02/18/18  Yes Darrick Meigs, Frederich Chick  S, MD  levothyroxine (SYNTHROID) 100 MCG tablet Take 1 tablet (100 mcg total) by mouth daily with breakfast. 12/07/19  Yes Dwyane Dee, MD  metoprolol succinate (TOPROL-XL) 100 MG 24 hr tablet Take 1 tablet (100 mg total) by mouth daily. Take with or immediately following a meal. 11/27/19  Yes Bunnie Pion Z, DO  nystatin (MYCOSTATIN/NYSTOP) powder Apply 1 application topically daily as needed (yeast). 12/06/19  Yes Dwyane Dee, MD  pantoprazole (PROTONIX) 40 MG tablet Take 1 tablet (40 mg total) by mouth daily. 09/30/19  Yes Vann, Jessica U, DO  pravastatin (PRAVACHOL) 10 MG tablet Take 10 mg by mouth daily with supper.    Yes [provider]  senna-docusate (SENOKOT-S) 8.6-50 MG tablet Take 1 tablet by mouth 2 (two) times daily. Patient taking differently: Take 1 tablet by mouth 2 (two) times daily as needed for mild constipation.  09/30/19  Yes Vann, Jessica U, DO  SOLIQUA 100-33 UNT-MCG/ML SOPN Inject 40 Units into the skin daily.  12/16/19  Yes [provider]  tamsulosin (FLOMAX) 0.4 MG CAPS capsule Take 0.4 mg by mouth daily. 12/15/19  Yes [provider]  tobramycin (TOBREX) 0.3 % ophthalmic solution Place 1 drop into both eyes in the morning and at bedtime. 12/06/19  Yes Dwyane Dee, MD  Multiple Vitamin (MULTIVITAMIN WITH MINERALS) TABS tablet Take 1 tablet by mouth daily. Patient not taking: Reported on 12/22/2019 09/30/19   Geradine Girt, DO  nutrition supplement, JUVEN, (JUVEN) PACK Take 1 packet by mouth 2 (two) times daily between meals. Patient not taking: Reported on 12/22/2019 09/30/19   Geradine Girt, DO  silver sulfADIAZINE (SILVADENE) 1 % cream Apply topically 2 (two) times daily. Patient not taking: Reported on 12/22/2019  09/30/19   Geradine Girt, DO   Allergies  Allergen Reactions  . Fluconazole Other (See Comments)    Drug-induced hepatitis   Review of Systems  Genitourinary: Positive for hematuria.  Neurological: Positive for weakness.   Physical Exam Vitals and nursing note reviewed.  Constitutional:      General: He is awake.  HENT:     Head: Normocephalic and atraumatic.  Pulmonary:     Effort: No tachypnea, accessory muscle usage or respiratory distress.  Musculoskeletal:     Right Lower Extremity: Right leg is amputated below knee.     Left Lower Extremity: Left leg is amputated below knee.  Skin:    General: Skin is warm and dry.     Coloration: Skin is pale.  Neurological:     Mental Status: He is alert and oriented to person, place, and time.  Psychiatric:        Mood and Affect: Mood normal.        Speech: Speech normal.        Behavior: Behavior normal.        Cognition and Memory: Cognition normal.     Vital Signs: BP 95/66 (BP Location: Right Arm)   Pulse 76   Temp 98.4 F (36.9 C) (Oral)   Resp 20   Ht $R'5\' 8"'WW$  (1.727 m) Comment: Bilateral BKA  Wt 70.4 kg   SpO2 100%   BMI 23.60 kg/m  Pain Scale: 0-10   Pain Score: 0-No pain   SpO2: SpO2: 100 % O2 Device:SpO2: 100 % O2 Flow Rate: .O2 Flow Rate (L/min): 0 L/min  IO: Intake/output summary:   Intake/Output Summary (Last 24 hours) at 12/24/2019 1730 Last data filed at 12/24/2019 1300 Gross per 24 hour  Intake 610.18  ml  Output 552 ml  Net 58.18 ml    LBM: Last BM Date: 12/23/19 Baseline Weight: Weight: 74.8 kg Most recent weight: Weight: 70.4 kg     Palliative Assessment/Data: PPS 40%   Flowsheet Rows     Most Recent Value  Intake Tab  Referral Department Hospitalist  Unit at Time of Referral Med/Surg Unit  Palliative Care Primary Diagnosis Other (Comment)  Palliative Care Type New Palliative care  Reason for referral Clarify Goals of Care  Date first seen by Palliative Care 12/23/19  Clinical  Assessment  Palliative Performance Scale Score 40%  Psychosocial & Spiritual Assessment  Palliative Care Outcomes  Patient/Family meeting held? Yes  Who was at the meeting? patient and spoke with daughter via telephone  Palliative Care Outcomes Clarified goals of care, Provided psychosocial or spiritual support, ACP counseling assistance, Linked to palliative care logitudinal support       Time Total: 61min Greater than 50%  of this time was spent counseling and coordinating care related to the above assessment and plan.  Signed by:  Ihor Dow, DNP, FNP-C Palliative Medicine Team  Phone: [phone] Fax: [phone]   Please contact Palliative Medicine Team phone at [phone] for questions and concerns.  For individual provider: See Shea Evans

## 2019-12-24 ENCOUNTER — Ambulatory Visit: Payer: Medicare Other | Admitting: Urology

## 2019-12-24 DIAGNOSIS — Z515 Encounter for palliative care: Secondary | ICD-10-CM

## 2019-12-24 DIAGNOSIS — Z7189 Other specified counseling: Secondary | ICD-10-CM

## 2019-12-24 LAB — GLUCOSE, CAPILLARY
Glucose-Capillary: 109 mg/dL — ABNORMAL HIGH (ref 70–99)
Glucose-Capillary: 140 mg/dL — ABNORMAL HIGH (ref 70–99)
Glucose-Capillary: 217 mg/dL — ABNORMAL HIGH (ref 70–99)
Glucose-Capillary: 181 mg/dL — ABNORMAL HIGH (ref 70–99)
Glucose-Capillary: 186 mg/dL — ABNORMAL HIGH (ref 70–99)

## 2019-12-24 LAB — COMPREHENSIVE METABOLIC PANEL
ALT: 10 U/L (ref 0–44)
AST: 13 U/L — ABNORMAL LOW (ref 15–41)
Albumin: 2.4 g/dL — ABNORMAL LOW (ref 3.5–5.0)
Alkaline Phosphatase: 115 U/L (ref 38–126)
Anion gap: 9 (ref 5–15)
BUN: 11 mg/dL (ref 8–23)
CO2: 24 mmol/L (ref 22–32)
Calcium: 8.2 mg/dL — ABNORMAL LOW (ref 8.9–10.3)
Chloride: 103 mmol/L (ref 98–111)
Creatinine, Ser: 0.73 mg/dL (ref 0.61–1.24)
GFR, Estimated: >60 mL/min (ref >60)
Glucose, Bld: 124 mg/dL — ABNORMAL HIGH (ref 70–99)
Potassium: 3.5 mmol/L (ref 3.5–5.1)
Sodium: 136 mmol/L (ref 135–145)
Total Bilirubin: 0.5 mg/dL (ref 0.3–1.2)
Total Protein: 6.1 g/dL — ABNORMAL LOW (ref 6.5–8.1)

## 2019-12-24 LAB — CBC
HCT: 27.3 % — ABNORMAL LOW (ref 39.0–52.0)
Hemoglobin: 8.3 g/dL — ABNORMAL LOW (ref 13.0–17.0)
MCH: 25.2 pg — ABNORMAL LOW (ref 26.0–34.0)
MCHC: 30.4 g/dL (ref 30.0–36.0)
MCV: 83 fL (ref 80.0–100.0)
Platelets: 465 10*3/uL — ABNORMAL HIGH (ref 150–400)
RBC: 3.29 MIL/uL — ABNORMAL LOW (ref 4.22–5.81)
RDW: 17.2 % — ABNORMAL HIGH (ref 11.5–15.5)
WBC: 8.9 10*3/uL (ref 4.0–10.5)
nRBC: 0 % (ref 0.0–0.2)

## 2019-12-24 LAB — MAGNESIUM: Magnesium: 1.8 mg/dL (ref 1.7–2.4)

## 2019-12-24 MED ORDER — INSULIN GLARGINE 100 UNIT/ML ~~LOC~~ SOLN
10.0000 [IU] | Freq: Every day | SUBCUTANEOUS | Status: DC
  Administered 2019-12-24: 10 [IU] via SUBCUTANEOUS
  Filled 2019-12-24 (×4): qty 0.1

## 2019-12-24 MED ORDER — CLOPIDOGREL BISULFATE 75 MG PO TABS: 75.0000 mg | ORAL_TABLET | Freq: Every day | ORAL | 0 refills | Status: DC

## 2019-12-24 MED ORDER — APIXABAN 5 MG PO TABS: 5.0000 mg | ORAL_TABLET | Freq: Two times a day (BID) | ORAL | 0 refills | Status: AC

## 2019-12-24 NOTE — Progress Notes (Signed)
Physical Therapy Treatment Patient Details Name: Anthony Dunlap MRN: [id number] DOB: May 12, 1933 Today's Date: 12/24/2019    History of Present Illness Anthony Dunlap is a 86 y.o. male with medical history significant for chronic atrial fibrillation fully anticoagulated with apixaban, severe peripheral vascular disease on Plavix, chronic urinary retention with a chronic indwelling Foley catheter, recently status post bilateral BKA, hypothyroidism, generalized anxiety disorder, history of prostate cancer, hyperlipidemia, gout who has been followed closely by his urologist Dr. Alyson Ingles.  He presented last week with complaints of blood in the urine.  This was thought to be secondary to a UTI and he had been placed on a course of Cefpodoxime.  His cultures have now grown out yeast infection.  Patient presented to the emergency department because he had no urine output from his Foley in over 24 hours.  After the Foley was irrigated there were multiple blood clots blocking Foley output and he was noted to have significant gross hematuria.  His hemoglobin had come down to 6.9.  Neurology was consulted in the emergency department and Dr. [name] recommended starting continuous bladder irrigation.  The patient was typed and crossed and transfused 1 unit packed red blood cells.  Hospital admission was requested for further management.    PT Comments    Patient demonstrates slow labored movement for sitting up at bedside with frequent falling backwards when not support self with BUE, had patient scoot backwards into chair requiring Max assist due to poor trunk control with frequent loss balance.  Had to use bed pad to slide patient backwards into chair and when getting back to bed had patient pull self forward using gait belt connected to bed rail while sliding patient forward back into bed using bed pad.  Patient required Mod/max assist to reposition self in bed.  Patient will benefit from continued physical therapy  in hospital and recommended venue below to increase strength, balance, endurance for safe ADLs and gait.    Follow Up Recommendations  SNF;Supervision for mobility/OOB;Supervision - Intermittent     Equipment Recommendations  None recommended by PT    Recommendations for Other Services       Precautions / Restrictions Precautions Precautions: Fall Precaution Comments: R BKA, 9/22 L BKA    Mobility  Bed Mobility Overal bed mobility: Needs Assistance Bed Mobility: Supine to Sit;Sit to Supine;Rolling Rolling: Min assist   Supine to sit: Mod assist Sit to supine: Min assist   General bed mobility comments: slow labored movement  Transfers Overall transfer level: Needs assistance Equipment used: 1 person hand held assist Transfers: Comptroller transfers: Max assist   General transfer comment: difficulty maintain sitting balance during dynamic movement, frequent falls backwards  Ambulation/Gait                 Stairs             Wheelchair Mobility    Modified Rankin (Stroke Patients Only)       Balance Overall balance assessment: Needs assistance Sitting-balance support: Feet unsupported;No upper extremity supported Sitting balance-Leahy Scale: Poor Sitting balance - Comments: fair when supporting with BUE Postural control: Posterior lean                                  Cognition Arousal/Alertness: Awake/alert Behavior During Therapy: WFL for tasks assessed/performed Overall Cognitive Status: Within Functional Limits for tasks assessed  Exercises      General Comments        Pertinent Vitals/Pain Pain Assessment: No/denies pain    Home Living                      Prior Function            PT Goals (current goals can now be found in the care plan section) Acute Rehab PT Goals Patient Stated Goal: return home  with family/home aides to assist PT Goal Formulation: With patient Time For Goal Achievement: 01/06/20 Potential to Achieve Goals: Good Progress towards PT goals: Progressing toward goals    Frequency    Min 3X/week      PT Plan Current plan remains appropriate    Co-evaluation              AM-PAC PT "6 Clicks" Mobility   Outcome Measure  Help needed turning from your back to your side while in a flat bed without using bedrails?: A Little Help needed moving from lying on your back to sitting on the side of a flat bed without using bedrails?: A Lot Help needed moving to and from a bed to a chair (including a wheelchair)?: A Lot Help needed standing up from a chair using your arms (e.g., wheelchair or bedside chair)?: Total Help needed to walk in hospital room?: Total Help needed climbing 3-5 steps with a railing? : Total 6 Click Score: 10    End of Session   Activity Tolerance: Patient tolerated treatment well;Patient limited by fatigue Patient left: in chair;with call bell/phone within reach Nurse Communication: Mobility status PT Visit Diagnosis: Other abnormalities of gait and mobility (R26.89);Muscle weakness (generalized) (M62.81);Difficulty in walking, not elsewhere classified (R26.2)     Time: 8413-2440 PT Time Calculation (min) (ACUTE ONLY): 33 min  Charges:  $Therapeutic Activity: 23-37 mins                     3:20 PM, 12/24/19 Anthony Dunlap, MPT Physical Therapist with [facility] [phone] office [phone] mobile phone

## 2019-12-24 NOTE — Progress Notes (Signed)
PROGRESS NOTE    Anthony Dunlap  JHE:[id number] DOB: 12/19/1933 DOA: 12/22/2019 PCP: [name], MD    Chief Complaint  Patient presents with  . Urinary Retention    Brief Narrative:  86 year old gentleman prior history of chronic atrial fibrillation, fully anticoagulated with apixaban, peripheral vascular disease on Plavix, chronic urinary retention s/p chronic Foley catheter, bilateral BKA, hypothyroidism, history of prostate cancer, hyperlipidemia presented with hematuria.  He was recently diagnosed with a UTI and has been placed on a course of Cefpodoxime.  Urine cultures are growing yeast at this time.  Urology consulted and he was placed on CBI.  Patient also underwent 1 unit of PRBC transfusion due to blood loss anemia from hematuria. CBI discontinued. His urine remained pink to dark red. Pt reports any nausea, vomiting or abdominal pain.     Assessment & Plan:   Active Problems:   Type 2 diabetes mellitus ([name])   Sixth nerve palsy of left eye   HLD (hyperlipidemia)   Elevated alkaline phosphatase level   Hypothyroidism   Physical deconditioning   UTI (urinary tract infection)   Hx of total hip arthroplasty, right   Typical atrial flutter ([name])   Hyperglycemia due to diabetes mellitus ([name])   Thrombocytosis   Hypoalbuminemia   Urinary retention   Hx of BKA, left ([name])   PAD (peripheral artery disease) ([name])   Gross hematuria   Hematuria   Acute blood loss anemia   Palliative care by specialist   Goals of care, counseling/discussion   Gross hematuria Improved with CBI.  Possibly secondary to a urinary tract infection.   Urology on board and appreciate recommendations. CBI was clamped and discontinued.  Urology recommends to continue with indwelling Foley catheter on discharge.  IV Eraxis for yeast in the urine cultures last week. Pt received 3 doses of IV anti fungal medication.     Chronic atrial fibrillation Rate controlled.  Continue with  amiodarone. Hold Eliquis until Monday, till his hematuria completely resolves.     Peripheral vascular disease Hold Plavix on hold due to gross hematuria.   Acute blood loss anemia secondary to hematuria. S/p 1 unit of PRBC transfusion Repeat hemoglobin at 8.2 Transfuse to keep hemoglobin greater than 7.    Urinary retention s/p chronic indwelling Foley catheter Recommend outpatient follow-up with urology on discharge.   Hypokalemia and hypomagnesemia Replaced  Type 2 diabetes mellitus with hyperglycemia, . CBG (last 3)  Recent Labs    12/24/19 0302 12/24/19 0833 12/24/19 1059  GLUCAP 109* 140* 217*   Resume sliding scale insulin at this time.      DVT prophylaxis: SCDs Code Status: Full code Family Communication: Discussed with family over the phone Disposition:   Status is: Inpatient  Remains inpatient appropriate because:Unsafe d/c plan, IV treatments appropriate due to intensity of illness or inability to take PO and Initial plan was to discharge him today, but duaghter said she cannot pick him up and wants ambulance transport.    Dispo: The patient is from: Home              Anticipated d/c is to: Home              Anticipated d/c date is: 1 day              Patient currently is not medically stable to d/c.       Consultants:   urology  Procedures: none  Antimicrobials:  Antibiotics Given (last 72 hours)  Date/Time Action Medication Dose   12/22/19 0830 Given   cefdinir (OMNICEF) capsule 300 mg 300 mg   12/22/19 2131 Given   cefdinir (OMNICEF) capsule 300 mg 300 mg   12/23/19 1104 Given   cefdinir (OMNICEF) capsule 300 mg 300 mg   12/23/19 2030 Given   cefdinir (OMNICEF) capsule 300 mg 300 mg   12/24/19 1046 Given   cefdinir (OMNICEF) capsule 300 mg 300 mg         Subjective: No new complaints at this time  Objective: Vitals:   12/23/19 1430 12/23/19 2030 12/24/19 0515 12/24/19 1330  BP: 128/71 (!) 132/94 (!) 148/83 95/66   Pulse: 88 77 84 76  Resp: 17 18 18 20   Temp: 98.7 F (37.1 C) 98.3 F (36.8 C) 97.9 F (36.6 C) 98.4 F (36.9 C)  TempSrc: Oral Oral Oral Oral  SpO2:  98% 99% 100%  Weight:      Height:        Intake/Output Summary (Last 24 hours) at 12/24/2019 1711 Last data filed at 12/24/2019 1300 Gross per 24 hour  Intake 610.18 ml  Output 552 ml  Net 58.18 ml   Filed Weights   12/22/19 0158 12/22/19 0204 12/22/19 1123  Weight: 74.8 kg 74.8 kg 70.4 kg    Examination:  General exam: Appears calm and comfortable  Respiratory system: Clear to auscultation. Respiratory effort normal. Cardiovascular system: S1 & S2 heard, RRR. No JVD,  No pedal edema. Gastrointestinal system: Abdomen is nondistended, soft and nontender. Normal bowel sounds heard. Central nervous system: Alert and oriented. No focal neurological deficits. Extremities:  Skin: No rashes,  Psychiatry: Mood & affect appropriate.     Data Reviewed: I have personally reviewed following labs and imaging studies  CBC: Recent Labs  Lab 12/22/19 0316 12/23/19 0452 12/24/19 0455  WBC 7.6 10.1 8.9  NEUTROABS 4.7  --   --   HGB 6.7* 8.2* 8.3*  HCT 22.6* 28.2* 27.3*  MCV 84.0 85.2 83.0  PLT 596* 555* 465*    Basic Metabolic Panel: Recent Labs  Lab 12/22/19 0316 12/23/19 0452 12/23/19 1718 12/24/19 0455  NA 139 139  --  136  K 3.3* 2.7* 3.4* 3.5  CL 101 106  --  103  CO2 26 24  --  24  GLUCOSE 143* 72  --  124*  BUN 14 10  --  11  CREATININE 0.86 0.64  --  0.73  CALCIUM 7.9* 8.1*  --  8.2*  MG  --  1.6*  --  1.8    GFR: Estimated Creatinine Clearance: 66.5 mL/min (by C-G formula based on SCr of 0.73 mg/dL).  Liver Function Tests: Recent Labs  Lab 12/23/19 0452 12/24/19 0455  AST 14* 13*  ALT 10 10  ALKPHOS 119 115  BILITOT 0.3 0.5  PROT 6.3* 6.1*  ALBUMIN 2.5* 2.4*    CBG: Recent Labs  Lab 12/23/19 1619 12/23/19 2133 12/24/19 0302 12/24/19 0833 12/24/19 1059  GLUCAP 170* 87 109* 140* 217*      Recent Results (from the past 240 hour(s))  Urine culture     Status: Abnormal (Preliminary result)   Collection Time: 12/22/19  3:58 AM   Specimen: Urine, Catheterized  Result Value Ref Range Status   Specimen Description   Final    URINE, CATHETERIZED Performed at [facility], [address], [place], [place] [address]    Special Requests   Final    NONE Performed at [facility], [address], [place],  [place] [address]    Culture (A)  Final    20,000 COLONIES/mL ENTEROCOCCUS FAECIUM REPEATING SENSITIVITY Performed at [facility], [place] [address] [place], [place] [address]    Report Status PENDING  Incomplete  Respiratory Panel by RT PCR (Flu A&B, Covid) - Nasopharyngeal Swab     Status: None   Collection Time: 12/22/19  4:27 AM   Specimen: Nasopharyngeal Swab  Result Value Ref Range Status   SARS Coronavirus 2 by RT PCR NEGATIVE NEGATIVE Final    Comment: (NOTE) SARS-CoV-2 target nucleic acids are NOT DETECTED.  The SARS-CoV-2 RNA is generally detectable in upper respiratoy specimens during the acute phase of infection. The lowest concentration of SARS-CoV-2 viral copies this assay can detect is 131 copies/mL. A negative result does not preclude SARS-Cov-2 infection and should not be used as the sole basis for treatment or other patient management decisions. A negative result may occur with  improper specimen collection/handling, submission of specimen other than nasopharyngeal swab, presence of viral mutation(s) within the areas targeted by this assay, and inadequate number of viral copies (<131 copies/mL). A negative result must be combined with clinical observations, patient history, and epidemiological information. The expected result is Negative.  Fact Sheet for Patients:  PinkCheek.be  Fact Sheet for Healthcare Providers:  GravelBags.it  This test is no t yet approved or cleared by the  [place] FDA and  has been authorized for detection and/or diagnosis of SARS-CoV-2 by FDA under an Emergency Use Authorization (EUA). This EUA will remain  in effect (meaning this test can be used) for the duration of the COVID-19 declaration under Section 564(b)(1) of the Act, 21 U.S.C. section 360bbb-3(b)(1), unless the authorization is terminated or revoked sooner.     Influenza A by PCR NEGATIVE NEGATIVE Final   Influenza B by PCR NEGATIVE NEGATIVE Final    Comment: (NOTE) The Xpert Xpress SARS-CoV-2/FLU/RSV assay is intended as an aid in  the diagnosis of influenza from Nasopharyngeal swab specimens and  should not be used as a sole basis for treatment. Nasal washings and  aspirates are unacceptable for Xpert Xpress SARS-CoV-2/FLU/RSV  testing.  Fact Sheet for Patients: PinkCheek.be  Fact Sheet for Healthcare Providers: GravelBags.it  This test is not yet approved or cleared by the [place] FDA and  has been authorized for detection and/or diagnosis of SARS-CoV-2 by  FDA under an Emergency Use Authorization (EUA). This EUA will remain  in effect (meaning this test can be used) for the duration of the  Covid-19 declaration under Section 564(b)(1) of the Act, 21  U.S.C. section 360bbb-3(b)(1), unless the authorization is  terminated or revoked. Performed at [facility], [address] [place],  [address]          Radiology Studies: No results found.      Scheduled Meds: . amiodarone  200 mg Oral Daily  . cefdinir  300 mg Oral Q12H  . Chlorhexidine Gluconate Cloth  6 each Topical Daily  . ferrous sulfate  325 mg Oral Q breakfast  . insulin aspart  0-15 Units Subcutaneous TID WC  . insulin aspart  0-5 Units Subcutaneous QHS  . insulin aspart  3 Units Subcutaneous TID WC  . insulin glargine  10 Units Subcutaneous QHS  . levothyroxine  100 mcg Oral Q breakfast  . metoprolol succinate  100  mg Oral Daily  . multivitamin with minerals  1 tablet Oral Daily  . potassium chloride  40 mEq Oral BID  . pravastatin  10 mg Oral Q supper  . tamsulosin  0.4 mg Oral Daily   Continuous Infusions: . sodium chloride    . anidulafungin 100 mg (12/24/19 1146)  . sodium chloride irrigation Stopped (12/22/19 1454)     LOS: 2 days       Hosie Poisson, MD [facility]   To contact the attending provider between 7A-7P or the covering provider during after hours 7P-7A, please log into the web site [email or web address] and access using universal [place] password for that web site. If you do not have the password, please call the hospital operator.  12/24/2019, 5:11 PM

## 2019-12-24 NOTE — Progress Notes (Addendum)
Urine noting to become more amber in color but continues to have adequate output. Small blood clot noted in tubing. Pt denies any pain/discomfort. Abdomen nontender nor distended at this time. Will continue to monitor.

## 2019-12-24 NOTE — TOC Transition Note (Signed)
Transition of Care [facility]) - CM/SW Discharge Note  Patient Details  Name: Anthony Dunlap MRN: [id number] Date of Birth: 04-26-1933  Transition of Care [facility]) CM/SW Contact:  Sherie Don, LCSW Phone Number: 12/24/2019, 3:41 PM  Clinical Narrative: PT evaluation recommended SNF. CSW spoke with patient and patient declined SNF. CSW scheduled PCP appointment for Tuesday December 29, 2019 at 1:30pm. Appointment added to AVS. Orders are in for [facility]. Georgina Snell with [name] updated. CSW updated patient's daughter. TOC signing off.  Final next level of care: [place] Barriers to Discharge: Barriers Resolved  Patient Goals and CMS Choice Patient states their goals for this hospitalization and ongoing recovery are:: Discharge home with [facility] CMS Medicare.gov Compare Post Acute Care list provided to:: Patient Choice offered to / list presented to : Patient  Discharge Plan and Services In-house Referral: Clinical Social Work Discharge Planning Services: NA Post Acute Care Choice: Home Health          DME Arranged: N/A DME Agency: NA HH Arranged: RN, PT, OT [place] Agency: [place] Date [place]: 12/24/19 Time [place]: [id number] Representative spoke with at [place]: Georgina Snell  Readmission Risk Interventions Readmission Risk Prevention Plan 12/22/2019 11/13/2019 09/29/2019  Transportation Screening Complete Complete Complete  PCP or Specialist Appt within 5-7 Days - - Not Complete  Not Complete comments - - plan for SNF  PCP or Specialist Appt within 3-5 Days - Complete -  Home Care Screening - - Complete  Medication Review (RN CM) - - Referral to Pharmacy  [place] or [place] - Complete -  Social Work Consult for [place] Planning/Counseling - Complete -  [place] - Not Applicable -  Medication Review Press photographer) Complete Complete -  PCP or Specialist appointment within 3-5 days of discharge Complete - -  [place] or Home Care  Consult Complete - -  SW Recovery Care/Counseling Consult Complete - -  Palliative Care Screening Not Complete - -  Comments Consult has been placed; palliative to see - -  [place] Complete - -  Some recent data might be hidden

## 2019-12-24 NOTE — Care Management Important Message (Signed)
Important Message  Patient Details  Name: Anthony Dunlap MRN: [id number] Date of Birth: 1933/03/23   Medicare Important Message Given:  Yes     Tommy Medal 12/24/2019, 12:07 PM

## 2019-12-25 DIAGNOSIS — E039 Hypothyroidism, unspecified: Secondary | ICD-10-CM

## 2019-12-25 LAB — CBC
HCT: 28.8 % — ABNORMAL LOW (ref 39.0–52.0)
Hemoglobin: 8.4 g/dL — ABNORMAL LOW (ref 13.0–17.0)
MCH: 24.3 pg — ABNORMAL LOW (ref 26.0–34.0)
MCHC: 29.2 g/dL — ABNORMAL LOW (ref 30.0–36.0)
MCV: 83.5 fL (ref 80.0–100.0)
Platelets: 482 10*3/uL — ABNORMAL HIGH (ref 150–400)
RBC: 3.45 MIL/uL — ABNORMAL LOW (ref 4.22–5.81)
RDW: 17 % — ABNORMAL HIGH (ref 11.5–15.5)
WBC: 9.4 10*3/uL (ref 4.0–10.5)
nRBC: 0 % (ref 0.0–0.2)

## 2019-12-25 LAB — COMPREHENSIVE METABOLIC PANEL
ALT: 10 U/L (ref 0–44)
AST: 14 U/L — ABNORMAL LOW (ref 15–41)
Albumin: 2.6 g/dL — ABNORMAL LOW (ref 3.5–5.0)
Alkaline Phosphatase: 122 U/L (ref 38–126)
Anion gap: 8 (ref 5–15)
BUN: 11 mg/dL (ref 8–23)
CO2: 26 mmol/L (ref 22–32)
Calcium: 8.4 mg/dL — ABNORMAL LOW (ref 8.9–10.3)
Chloride: 101 mmol/L (ref 98–111)
Creatinine, Ser: 0.74 mg/dL (ref 0.61–1.24)
GFR, Estimated: >60 mL/min (ref >60)
Glucose, Bld: 125 mg/dL — ABNORMAL HIGH (ref 70–99)
Potassium: 4.5 mmol/L (ref 3.5–5.1)
Sodium: 135 mmol/L (ref 135–145)
Total Bilirubin: 0.4 mg/dL (ref 0.3–1.2)
Total Protein: 6.5 g/dL (ref 6.5–8.1)

## 2019-12-25 LAB — URINE CULTURE: Culture: 20000 — AB

## 2019-12-25 LAB — MAGNESIUM: Magnesium: 1.7 mg/dL (ref 1.7–2.4)

## 2019-12-25 LAB — GLUCOSE, CAPILLARY
Glucose-Capillary: 127 mg/dL — ABNORMAL HIGH (ref 70–99)
Glucose-Capillary: 221 mg/dL — ABNORMAL HIGH (ref 70–99)

## 2019-12-25 NOTE — Clinical Social Work Note (Signed)
CSW completed medical necessity form and scheduled EMS. CSW notified daughter. Medical necessity form provided to RN station and RN updated.

## 2019-12-25 NOTE — Discharge Summary (Signed)
Physician Discharge Summary  Anthony Dunlap VFI:[id number] DOB: 03/18/1933 DOA: 12/22/2019  PCP: [name], MD  Admit date: 12/22/2019 Discharge date: 12/25/2019  Admitted From: Home.  Disposition:Home  Recommendations for Outpatient Follow-up:  1. Follow up with PCP in 1-2 weeks 2. Please obtain BMP/CBC in one week 3. Please follow up with urology as recommended.   Home Health:YES.   Discharge Condition:stable.  CODE STATUS: FULL CODE.  Diet recommendation: Heart Healthy Brief/Interim Summary: 86 year old gentleman prior history of chronic atrial fibrillation, fully anticoagulated with apixaban, peripheral vascular disease on Plavix, chronic urinary retention s/p chronic Foley catheter, bilateral BKA, hypothyroidism, history of prostate cancer, hyperlipidemia presented with hematuria.  He was recently diagnosed with a UTI and has been placed on a course of Cefpodoxime.  Urine cultures are growing yeast at this time.  Urology consulted and he was placed on CBI.  Patient also underwent 1 unit of PRBC transfusion due to blood loss anemia from hematuria. CBI discontinued and his urine cleared up.    Discharge Diagnoses:  Active Problems:   Type 2 diabetes mellitus ([name])   Sixth nerve palsy of left eye   HLD (hyperlipidemia)   Elevated alkaline phosphatase level   Hypothyroidism   Physical deconditioning   UTI (urinary tract infection)   Hx of total hip arthroplasty, right   Typical atrial flutter ([name])   Hyperglycemia due to diabetes mellitus ([name])   Thrombocytosis   Hypoalbuminemia   Urinary retention   Hx of BKA, left ([name])   PAD (peripheral artery disease) ([name])   Gross hematuria   Hematuria   Acute blood loss anemia   Palliative care by specialist   Goals of care, counseling/discussion  Gross hematuria Resolved with CBI.  Possibly secondary to a urinary tract infection.   Urology on board and appreciate recommendations. CBI was clamped and discontinued.  Urology  recommends to continue with indwelling Foley catheter on discharge.  IV Eraxis for yeast in the urine cultures last week. Pt received 3 doses of IV anti fungal medication. discussed with Dr Baxter Flattery, suggests 3 days of treatment should  Be sufficient.  Currently pt is not symptomatic.    Chronic atrial fibrillation Rate controlled.  Continue with amiodarone. Hold Eliquis until Monday, till his hematuria completely resolves.     Peripheral vascular disease HOld plavix till Monday and slowly restart it if there is no hematuria.    Acute blood loss anemia secondary to hematuria. S/p 1 unit of PRBC transfusion Repeat hemoglobin at 8.2 Transfuse to keep hemoglobin greater than 7.    Urinary retention s/p chronic indwelling Foley catheter Recommend outpatient follow-up with urology on discharge.   Hypokalemia and hypomagnesemia Replaced  Type 2 diabetes mellitus with hyperglycemia, CBG (last 3)  Recent Labs    12/24/19 2057 12/25/19 0711 12/25/19 1055  GLUCAP 186* 127* 221*   Resume home meds on discharge.  [name]   Discharge Instructions  Discharge Instructions    Diet - low sodium heart healthy   Complete by: As directed    Discharge wound care:   Complete by: As directed    Follow up with wound care at home.   Increase activity slowly   Complete by: As directed    Increase activity slowly   Complete by: As directed    No wound care   Complete by: As directed      Allergies as of 12/25/2019      Reactions   Fluconazole Other (See Comments)   Drug-induced hepatitis  Medication List    STOP taking these medications   cefpodoxime 200 MG tablet Commonly known as: VANTIN   multivitamin with minerals Tabs tablet   nutrition supplement (JUVEN) Pack   silver sulfADIAZINE 1 % cream Commonly known as: SILVADENE     TAKE these medications   acetaminophen 500 MG tablet Commonly known as: TYLENOL Take 1,000 mg by mouth every 6 (six) hours as  needed for mild pain or moderate pain.   ALPRAZolam 0.5 MG tablet Commonly known as: XANAX Take 1 tablet (0.5 mg total) by mouth 2 (two) times daily as needed for anxiety. What changed: when to take this   amiodarone 200 MG tablet Commonly known as: PACERONE Take 1 tablet (200 mg total) by mouth 2 (two) times daily for 14 days. Amiodarone 200mg  BID for 14 days and then once daily. What changed:   when to take this  additional instructions   amitriptyline 10 MG tablet Commonly known as: ELAVIL Take 20 mg by mouth at bedtime.   apixaban 5 MG Tabs tablet Commonly known as: ELIQUIS Take 1 tablet (5 mg total) by mouth 2 (two) times daily. Start taking on: December 28, 2019 What changed: These instructions start on December 28, 2019. If you are unsure what to do until then, ask your doctor or other care provider.   clopidogrel 75 MG tablet Commonly known as: Plavix Take 1 tablet (75 mg total) by mouth daily. Start taking on: December 28, 2019 What changed: These instructions start on December 28, 2019. If you are unsure what to do until then, ask your doctor or other care provider.   colchicine 0.6 MG tablet Take 0.6 mg by mouth daily as needed (gout attacks).   ferrous sulfate 325 (65 FE) MG tablet Take 1 tablet (325 mg total) by mouth 2 (two) times daily with a meal.   insulin aspart 100 UNIT/ML injection Commonly known as: novoLOG Inject 0-15 Units into the skin 3 (three) times daily with meals. Sliding scale insulin Less than 70 initiate hypoglycemia protocol 70-120  0 units 120-150 2 unit 151-200 3 units 201-250 3 units 251-300 5 units 301-350 8 units 351-400 11 units  Greater than 400 15 units , call MD   levothyroxine 100 MCG tablet Commonly known as: SYNTHROID Take 1 tablet (100 mcg total) by mouth daily with breakfast.   metoprolol succinate 100 MG 24 hr tablet Commonly known as: TOPROL-XL Take 1 tablet (100 mg total) by mouth daily. Take with or immediately  following a meal.   nystatin powder Commonly known as: MYCOSTATIN/NYSTOP Apply 1 application topically daily as needed (yeast).   pantoprazole 40 MG tablet Commonly known as: PROTONIX Take 1 tablet (40 mg total) by mouth daily.   pravastatin 10 MG tablet Commonly known as: PRAVACHOL Take 10 mg by mouth daily with supper.   senna-docusate 8.6-50 MG tablet Commonly known as: Senokot-S Take 1 tablet by mouth 2 (two) times daily. What changed:   when to take this  reasons to take this   Soliqua 100-33 UNT-MCG/ML Sopn Generic drug: Insulin Glargine-Lixisenatide Inject 40 Units into the skin daily.   tamsulosin 0.4 MG Caps capsule Commonly known as: FLOMAX Take 0.4 mg by mouth daily.   tobramycin 0.3 % ophthalmic solution Commonly known as: TOBREX Place 1 drop into both eyes in the morning and at bedtime.            Discharge Care Instructions  (From admission, onward)         Start  Ordered   12/24/19 0000  Discharge wound care:       Comments: Follow up with wound care at home.   12/24/19 1524          Follow-up Information    [facility] Follow up.   Specialty: Home Health Services Why: PT, OT, RN Contact information: [address] [place] [address] [place] [address] [phone]        [name], MD Follow up on 12/29/2019.   Specialty: Internal Medicine Why: Appointment is at 1:30pm Contact information: [address] [place] [address] [phone]        Arnoldo Lenis, MD .   Specialty: Cardiology Contact information: [address] [place] [address] [phone]        Cleon Gustin, MD. Schedule an appointment as soon as possible for a visit in 1 week(s).   Specialty: Urology Contact information: [place] [address] [phone]              Allergies  Allergen Reactions  . Fluconazole Other (See Comments)    Drug-induced hepatitis     Consultations:  Urology.    Procedures/Studies: DG Chest 1 View  Result Date: 12/01/2019 CLINICAL DATA:  Altered mental status EXAM: CHEST  1 VIEW COMPARISON:  11/19/2019 FINDINGS: Cardiac shadow is within normal limits. Tortuous thoracic aorta is noted. The lungs are well aerated without focal infiltrate or sizable effusion. Degenerative changes of the thoracic spine are noted. IMPRESSION: No active disease. Electronically Signed   By: Inez Catalina M.D.   On: 12/01/2019 23:53   CT Head Wo Contrast  Result Date: 12/01/2019 CLINICAL DATA:  Altered mental status, slurred speech EXAM: CT HEAD WITHOUT CONTRAST TECHNIQUE: Contiguous axial images were obtained from the base of the skull through the vertex without intravenous contrast. COMPARISON:  MRI 08/20/2013 (images only) CT head 08/10/2013 (report only) FINDINGS: Brain: No evidence of acute infarction, hemorrhage, hydrocephalus, extra-axial collection, visible mass lesion or mass effect. Symmetric prominence of the ventricles, cisterns and sulci compatible with parenchymal volume loss. Patchy areas of white matter hypoattenuation are most compatible with chronic microvascular angiopathy. Vascular: Atherosclerotic calcification of the carotid siphons and intradural vertebral arteries. No hyperdense vessel. Skull: No calvarial fracture or suspicious osseous lesion. No scalp swelling or hematoma. Sinuses/Orbits: Paranasal sinuses and mastoid air cells are predominantly clear. Orbital structures are unremarkable aside from prior lens extractions. Other: Debris in the right external auditory canal. IMPRESSION: 1. No acute intracranial findings. If there is persisting concern for acute infarction, MRI is more sensitive and specific for early features of ischemia. 2. Chronic microvascular angiopathy and parenchymal volume loss. 3. Debris in the right external auditory canal, correlate for cerumen impaction. Electronically Signed   By: Lovena Le M.D.    On: 12/01/2019 23:29   MR BRAIN WO CONTRAST  Result Date: 12/02/2019 CLINICAL DATA:  Altered mental status, weakness EXAM: MRI HEAD WITHOUT CONTRAST TECHNIQUE: Multiplanar, multiecho pulse sequences of the brain and surrounding structures were obtained without intravenous contrast. COMPARISON:  2015 FINDINGS: Brain: There is no acute infarction or intracranial hemorrhage. There is no intracranial mass, mass effect, or edema. There is no hydrocephalus or extra-axial fluid collection. Ventricles and sulci prominent compatible with generalized parenchymal volume loss, which has progressed since 2015. Patchy and confluent areas of T2 hyperintensity in the supratentorial greater than pontine white matter are nonspecific but probably reflect moderate chronic microvascular ischemic changes, which have also progressed. Vascular: Major  vessel flow voids at the skull base are preserved. Skull and upper cervical spine: Normal marrow signal is preserved. Sinuses/Orbits: Trace mucosal thickening.  Orbits are unremarkable. Other: Sella is unremarkable.  Bilateral lens replacements. IMPRESSION: No acute infarction, hemorrhage, or mass. Progression of chronic microvascular ischemic changes and parenchymal volume loss since 2015. Electronically Signed   By: Macy Mis M.D.   On: 12/02/2019 09:01   US Carotid Bilateral  Result Date: 12/02/2019 CLINICAL DATA:  Concern for stroke. EXAM: BILATERAL CAROTID DUPLEX ULTRASOUND TECHNIQUE: Pearline Cables scale imaging, color Doppler and duplex ultrasound were performed of bilateral carotid and vertebral arteries in the neck. COMPARISON:  None. FINDINGS: Criteria: Quantification of carotid stenosis is based on velocity parameters that correlate the residual internal carotid diameter with NASCET-based stenosis levels, using the diameter of the distal internal carotid lumen as the denominator for stenosis measurement. The following velocity measurements were obtained: RIGHT ICA: 71/12 cm/sec  CCA: 62/70 cm/sec SYSTOLIC ICA/CCA RATIO:  1.0 ECA: 91 cm/sec LEFT ICA: 66/22 cm/sec CCA: 35/00 cm/sec SYSTOLIC ICA/CCA RATIO:  0.8 ECA: 69 cm/sec RIGHT CAROTID ARTERY: Echogenic plaque in the distal common carotid artery and carotid bulb. External carotid artery is patent with normal waveform. Normal waveforms and velocities in the internal carotid artery. Distal internal carotid artery is very tortuous. RIGHT VERTEBRAL ARTERY: Antegrade flow and normal waveform in the right vertebral artery. LEFT CAROTID ARTERY: Small amount of heterogeneous plaque in the proximal left common carotid artery. Small amount of plaque at the left carotid bulb. External carotid artery is patent with normal waveform. Normal waveforms and velocities in the internal carotid artery. LEFT VERTEBRAL ARTERY: Antegrade flow and normal waveform in the left vertebral artery. IMPRESSION: 1. Mild atherosclerotic disease involving the carotid arteries without significant stenosis. Estimated degree of stenosis in the internal carotid arteries is less than 50% bilaterally. 2. Patent vertebral arteries with antegrade flow. Electronically Signed   By: Markus Daft M.D.   On: 12/02/2019 13:34   ECHOCARDIOGRAM LIMITED  Result Date: 12/02/2019    ECHOCARDIOGRAM LIMITED REPORT   Patient Name:   Thelton D Mullinax Date of Exam: 12/02/2019 Medical Rec #:  [id number]      Height:       68.0 in Accession #:    [id number]     Weight:       145.3 lb Date of Birth:  1934/01/24      BSA:          1.784 m Patient Age:    89 years       BP:           138/77 mmHg Patient Gender: M              HR:           88 bpm. Exam Location:  [facility] Procedure: Limited Echo Indications:    Stroke 434.91 / I163.9  History:        Patient has prior history of Echocardiogram examinations, most                 recent 11/06/2019. Arrythmias:Atrial Flutter and Atrial                 Fibrillation; Risk Factors:Dyslipidemia, Diabetes and                 Non-Smoker. Prostate cancer.   Sonographer:    Leavy Cella RDCS (AE) Referring Phys: [id number] OLADAPO ADEFESO IMPRESSIONS  1. Left ventricular ejection fraction, by estimation, is 55  to 60%. The left ventricle has normal function. Left ventricular endocardial border not optimally defined to evaluate regional wall motion. There is moderate left ventricular hypertrophy. Left ventricular diastolic parameters are indeterminate.  2. Right ventricular systolic function is normal. The right ventricular size is normal. Tricuspid regurgitation signal is inadequate for assessing PA pressure.  3. The mitral valve is grossly normal. Trivial mitral valve regurgitation. Moderate mitral annular calcification.  4. The aortic valve is tricuspid. Aortic valve regurgitation is not visualized.  5. Unable to estimate CVP. FINDINGS  Left Ventricle: Left ventricular ejection fraction, by estimation, is 55 to 60%. The left ventricle has normal function. Left ventricular endocardial border not optimally defined to evaluate regional wall motion. The left ventricular internal cavity size was normal in size. There is moderate left ventricular hypertrophy. Left ventricular diastolic parameters are indeterminate. Right Ventricle: The right ventricular size is normal. No increase in right ventricular wall thickness. Right ventricular systolic function is normal. Tricuspid regurgitation signal is inadequate for assessing PA pressure. Pericardium: There is no evidence of pericardial effusion. Presence of pericardial fat pad. Mitral Valve: The mitral valve is grossly normal. Moderate mitral annular calcification. Trivial mitral valve regurgitation. Tricuspid Valve: The tricuspid valve is not well visualized. Tricuspid valve regurgitation is trivial. Aortic Valve: The aortic valve is tricuspid. There is mild aortic valve annular calcification. Aortic valve regurgitation is not visualized. Pulmonic Valve: The pulmonic valve was not well visualized. Pulmonic valve regurgitation  is trivial. Aorta: The aortic root is normal in size and structure. Venous: Unable to estimate CVP. The inferior vena cava was not well visualized. IAS/Shunts: No atrial level shunt detected by color flow Doppler. LEFT VENTRICLE PLAX 2D LVIDd:         2.90 cm LVIDs:         2.27 cm LV PW:         1.45 cm LV IVS:        1.25 cm LVOT diam:     2.10 cm LVOT Area:     3.46 cm  LEFT ATRIUM         Index LA diam:    3.50 cm 1.96 cm/m   AORTA Ao Root diam: 3.00 cm MITRAL VALVE MV Area (PHT): 3.85 cm     SHUNTS MV Decel Time: 197 msec     Systemic Diam: 2.10 cm MV E velocity: 56.60 cm/s MV A velocity: 101.00 cm/s MV E/A ratio:  0.56 Rozann Lesches MD Electronically signed by Rozann Lesches MD Signature Date/Time: 12/02/2019/11:17:33 AM    Final        Subjective: No chest pain or sob.   Discharge Exam: Vitals:   12/24/19 1330 12/24/19 2032  BP: 95/66 (!) 141/76  Pulse: 76 81  Resp: 20 18  Temp: 98.4 F (36.9 C) 98.9 F (37.2 C)  SpO2: 100% 99%   Vitals:   12/23/19 2030 12/24/19 0515 12/24/19 1330 12/24/19 2032  BP: (!) 132/94 (!) 148/83 95/66 (!) 141/76  Pulse: 77 84 76 81  Resp: 18 18 20 18   Temp: 98.3 F (36.8 C) 97.9 F (36.6 C) 98.4 F (36.9 C) 98.9 F (37.2 C)  TempSrc: Oral Oral Oral   SpO2: 98% 99% 100% 99%  Weight:      Height:        General: Pt is alert, awake, not in acute distress Cardiovascular: RRR, S1/S2 +, no rubs, no gallops Respiratory: CTA bilaterally, no wheezing, no rhonchi Abdominal: Soft, NT, ND, bowel sounds + Extremities: no edema,  no cyanosis    The results of significant diagnostics from this hospitalization (including imaging, microbiology, ancillary and laboratory) are listed below for reference.     Microbiology: Recent Results (from the past 240 hour(s))  Urine culture     Status: Abnormal   Collection Time: 12/22/19  3:58 AM   Specimen: Urine, Catheterized  Result Value Ref Range Status   Specimen Description   Final    URINE,  CATHETERIZED Performed at [facility], [address] [place], [place] [address]    Special Requests   Final    NONE Performed at [facility], [address], [place], [place] [address]    Culture 20,000 COLONIES/mL ENTEROCOCCUS FAECIUM (A)  Final   Report Status 12/25/2019 FINAL  Final   Organism ID, Bacteria ENTEROCOCCUS FAECIUM (A)  Final      Susceptibility   Enterococcus faecium - MIC*    AMPICILLIN >=32 RESISTANT Resistant     NITROFURANTOIN 256 RESISTANT Resistant     VANCOMYCIN 8 INTERMEDIATE Intermediate     LINEZOLID 2 SENSITIVE Sensitive     * 20,000 COLONIES/mL ENTEROCOCCUS FAECIUM  Respiratory Panel by RT PCR (Flu A&B, Covid) - Nasopharyngeal Swab     Status: None   Collection Time: 12/22/19  4:27 AM   Specimen: Nasopharyngeal Swab  Result Value Ref Range Status   SARS Coronavirus 2 by RT PCR NEGATIVE NEGATIVE Final    Comment: (NOTE) SARS-CoV-2 target nucleic acids are NOT DETECTED.  The SARS-CoV-2 RNA is generally detectable in upper respiratoy specimens during the acute phase of infection. The lowest concentration of SARS-CoV-2 viral copies this assay can detect is 131 copies/mL. A negative result does not preclude SARS-Cov-2 infection and should not be used as the sole basis for treatment or other patient management decisions. A negative result may occur with  improper specimen collection/handling, submission of specimen other than nasopharyngeal swab, presence of viral mutation(s) within the areas targeted by this assay, and inadequate number of viral copies (<131 copies/mL). A negative result must be combined with clinical observations, patient history, and epidemiological information. The expected result is Negative.  Fact Sheet for Patients:  PinkCheek.be  Fact Sheet for Healthcare Providers:  GravelBags.it  This test is no t yet approved or cleared by the [place] FDA and  has been  authorized for detection and/or diagnosis of SARS-CoV-2 by FDA under an Emergency Use Authorization (EUA). This EUA will remain  in effect (meaning this test can be used) for the duration of the COVID-19 declaration under Section 564(b)(1) of the Act, 21 U.S.C. section 360bbb-3(b)(1), unless the authorization is terminated or revoked sooner.     Influenza A by PCR NEGATIVE NEGATIVE Final   Influenza B by PCR NEGATIVE NEGATIVE Final    Comment: (NOTE) The Xpert Xpress SARS-CoV-2/FLU/RSV assay is intended as an aid in  the diagnosis of influenza from Nasopharyngeal swab specimens and  should not be used as a sole basis for treatment. Nasal washings and  aspirates are unacceptable for Xpert Xpress SARS-CoV-2/FLU/RSV  testing.  Fact Sheet for Patients: PinkCheek.be  Fact Sheet for Healthcare Providers: GravelBags.it  This test is not yet approved or cleared by the [place] FDA and  has been authorized for detection and/or diagnosis of SARS-CoV-2 by  FDA under an Emergency Use Authorization (EUA). This EUA will remain  in effect (meaning this test can be used) for the duration of the  Covid-19 declaration under Section 564(b)(1) of the Act, 21  U.S.C. section 360bbb-3(b)(1), unless  the authorization is  terminated or revoked. Performed at [facility], [address], [place], [place] [address]      Labs: BNP (last 3 results) No results for input(s): BNP in the last 8760 hours. Basic Metabolic Panel: Recent Labs  Lab 12/22/19 0316 12/23/19 0452 12/23/19 1718 12/24/19 0455 12/25/19 0652  NA 139 139  --  136 135  K 3.3* 2.7* 3.4* 3.5 4.5  CL 101 106  --  103 101  CO2 26 24  --  24 26  GLUCOSE 143* 72  --  124* 125*  BUN 14 10  --  11 11  CREATININE 0.86 0.64  --  0.73 0.74  CALCIUM 7.9* 8.1*  --  8.2* 8.4*  MG  --  1.6*  --  1.8 1.7   Liver Function Tests: Recent Labs  Lab 12/23/19 0452 12/24/19 0455  12/25/19 0652  AST 14* 13* 14*  ALT 10 10 10   ALKPHOS 119 115 122  BILITOT 0.3 0.5 0.4  PROT 6.3* 6.1* 6.5  ALBUMIN 2.5* 2.4* 2.6*   No results for input(s): LIPASE, AMYLASE in the last 168 hours. No results for input(s): AMMONIA in the last 168 hours. CBC: Recent Labs  Lab 12/22/19 0316 12/23/19 0452 12/24/19 0455 12/25/19 0652  WBC 7.6 10.1 8.9 9.4  NEUTROABS 4.7  --   --   --   HGB 6.7* 8.2* 8.3* 8.4*  HCT 22.6* 28.2* 27.3* 28.8*  MCV 84.0 85.2 83.0 83.5  PLT 596* 555* 465* 482*   Cardiac Enzymes: No results for input(s): CKTOTAL, CKMB, CKMBINDEX, TROPONINI in the last 168 hours. BNP: Invalid input(s): POCBNP CBG: Recent Labs  Lab 12/24/19 1059 12/24/19 1731 12/24/19 2057 12/25/19 0711 12/25/19 1055  GLUCAP 217* 181* 186* 127* 221*   D-Dimer No results for input(s): DDIMER in the last 72 hours. Hgb A1c No results for input(s): HGBA1C in the last 72 hours. Lipid Profile No results for input(s): CHOL, HDL, LDLCALC, TRIG, CHOLHDL, LDLDIRECT in the last 72 hours. Thyroid function studies No results for input(s): TSH, T4TOTAL, T3FREE, THYROIDAB in the last 72 hours.  Invalid input(s): FREET3 Anemia work up No results for input(s): VITAMINB12, FOLATE, FERRITIN, TIBC, IRON, RETICCTPCT in the last 72 hours. Urinalysis    Component Value Date/Time   COLORURINE AMBER (A) 12/22/2019 0358   APPEARANCEUR CLEAR 12/22/2019 0358   LABSPEC 1.004 (L) 12/22/2019 0358   PHURINE 7.0 12/22/2019 0358   GLUCOSEU 50 (A) 12/22/2019 0358   HGBUR LARGE (A) 12/22/2019 0358   BILIRUBINUR NEGATIVE 12/22/2019 0358   KETONESUR NEGATIVE 12/22/2019 0358   PROTEINUR 100 (A) 12/22/2019 0358   UROBILINOGEN 0.2 09/03/2013 0132   NITRITE NEGATIVE 12/22/2019 0358   LEUKOCYTESUR TRACE (A) 12/22/2019 0358   Sepsis Labs Invalid input(s): PROCALCITONIN,  WBC,  LACTICIDVEN Microbiology Recent Results (from the past 240 hour(s))  Urine culture     Status: Abnormal   Collection Time:  12/22/19  3:58 AM   Specimen: Urine, Catheterized  Result Value Ref Range Status   Specimen Description   Final    URINE, CATHETERIZED Performed at [facility], [address], [place], [place] [address]    Special Requests   Final    NONE Performed at [facility], [address], [place], [place] [address]    Culture 20,000 COLONIES/mL ENTEROCOCCUS FAECIUM (A)  Final   Report Status 12/25/2019 FINAL  Final   Organism ID, Bacteria ENTEROCOCCUS FAECIUM (A)  Final      Susceptibility   Enterococcus  faecium - MIC*    AMPICILLIN >=32 RESISTANT Resistant     NITROFURANTOIN 256 RESISTANT Resistant     VANCOMYCIN 8 INTERMEDIATE Intermediate     LINEZOLID 2 SENSITIVE Sensitive     * 20,000 COLONIES/mL ENTEROCOCCUS FAECIUM  Respiratory Panel by RT PCR (Flu A&B, Covid) - Nasopharyngeal Swab     Status: None   Collection Time: 12/22/19  4:27 AM   Specimen: Nasopharyngeal Swab  Result Value Ref Range Status   SARS Coronavirus 2 by RT PCR NEGATIVE NEGATIVE Final    Comment: (NOTE) SARS-CoV-2 target nucleic acids are NOT DETECTED.  The SARS-CoV-2 RNA is generally detectable in upper respiratoy specimens during the acute phase of infection. The lowest concentration of SARS-CoV-2 viral copies this assay can detect is 131 copies/mL. A negative result does not preclude SARS-Cov-2 infection and should not be used as the sole basis for treatment or other patient management decisions. A negative result may occur with  improper specimen collection/handling, submission of specimen other than nasopharyngeal swab, presence of viral mutation(s) within the areas targeted by this assay, and inadequate number of viral copies (<131 copies/mL). A negative result must be combined with clinical observations, patient history, and epidemiological information. The expected result is Negative.  Fact Sheet for Patients:  PinkCheek.be  Fact Sheet for Healthcare Providers:   GravelBags.it  This test is no t yet approved or cleared by the [place] FDA and  has been authorized for detection and/or diagnosis of SARS-CoV-2 by FDA under an Emergency Use Authorization (EUA). This EUA will remain  in effect (meaning this test can be used) for the duration of the COVID-19 declaration under Section 564(b)(1) of the Act, 21 U.S.C. section 360bbb-3(b)(1), unless the authorization is terminated or revoked sooner.     Influenza A by PCR NEGATIVE NEGATIVE Final   Influenza B by PCR NEGATIVE NEGATIVE Final    Comment: (NOTE) The Xpert Xpress SARS-CoV-2/FLU/RSV assay is intended as an aid in  the diagnosis of influenza from Nasopharyngeal swab specimens and  should not be used as a sole basis for treatment. Nasal washings and  aspirates are unacceptable for Xpert Xpress SARS-CoV-2/FLU/RSV  testing.  Fact Sheet for Patients: PinkCheek.be  Fact Sheet for Healthcare Providers: GravelBags.it  This test is not yet approved or cleared by the [place] FDA and  has been authorized for detection and/or diagnosis of SARS-CoV-2 by  FDA under an Emergency Use Authorization (EUA). This EUA will remain  in effect (meaning this test can be used) for the duration of the  Covid-19 declaration under Section 564(b)(1) of the Act, 21  U.S.C. section 360bbb-3(b)(1), unless the authorization is  terminated or revoked. Performed at [facility], [address] [place], [place] [address]      Time coordinating discharge: 33 minutes.  SIGNED:   Hosie Poisson, MD  [facility] 12/25/2019, 3:45 PM

## 2019-12-28 ENCOUNTER — Telehealth: Payer: Self-pay | Admitting: *Deleted

## 2019-12-28 ENCOUNTER — Ambulatory Visit: Payer: Medicare Other | Admitting: Urology

## 2019-12-28 NOTE — Telephone Encounter (Signed)
Clarification on amiodarone dose and directions. Previously discharged with loading amiodarone dose of 200 mg BID for 14 days started on 11/26/2019 on 12/12/2019 decreased amiodarone 200 mg to daily. Recent d/c 12/25/2019 has exact same directions from previous d/c. Please clarify if patient to stay on amiodarone 200 mg daily.

## 2019-12-29 NOTE — Telephone Encounter (Signed)
Inez Catalina informed and verbalized understanding of plan

## 2019-12-29 NOTE — Telephone Encounter (Signed)
Should be amiodarone 200mg  daily  Zandra Abts MD

## 2019-12-31 ENCOUNTER — Other Ambulatory Visit: Payer: Self-pay

## 2019-12-31 ENCOUNTER — Ambulatory Visit (INDEPENDENT_AMBULATORY_CARE_PROVIDER_SITE_OTHER): Payer: Medicare Other | Admitting: Urology

## 2019-12-31 ENCOUNTER — Encounter: Payer: Self-pay | Admitting: Urology

## 2019-12-31 VITALS — BP 106/61 | HR 76 | Temp 98.9°F | Ht 68.0 in | Wt 155.0 lb

## 2019-12-31 DIAGNOSIS — R310 Gross hematuria: Secondary | ICD-10-CM

## 2019-12-31 NOTE — Patient Instructions (Signed)
Hematuria, Adult Hematuria is blood in the urine. Blood may be visible in the urine, or it may be identified with a test. This condition can be caused by infections of the bladder, urethra, kidney, or prostate. Other possible causes include:  Kidney stones.  Cancer of the urinary tract.  Too much calcium in the urine.  Conditions that are passed from parent to child (inherited conditions).  Exercise that requires a lot of energy. Infections can usually be treated with medicine, and a kidney stone usually will pass through your urine. If neither of these is the cause of your hematuria, more tests may be needed to identify the cause of your symptoms. It is very important to tell your health care provider about any blood in your urine, even if it is painless or the blood stops without treatment. Blood in the urine, when it happens and then stops and then happens again, can be a symptom of a very serious condition, including cancer. There is no pain in the initial stages of many urinary cancers. Follow these instructions at home: Medicines  Take over-the-counter and prescription medicines only as told by your health care provider.  If you were prescribed an antibiotic medicine, take it as told by your health care provider. Do not stop taking the antibiotic even if you start to feel better. Eating and drinking  Drink enough fluid to keep your urine clear or pale yellow. It is recommended that you drink 3-4 quarts (2.8-3.8 L) a day. If you have been diagnosed with an infection, it is recommended that you drink cranberry juice in addition to large amounts of water.  Avoid caffeine, tea, and carbonated beverages. These tend to irritate the bladder.  Avoid alcohol because it may irritate the prostate (men). General instructions  If you have been diagnosed with a kidney stone, follow your health care provider's instructions about straining your urine to catch the stone.  Empty your bladder  often. Avoid holding urine for long periods of time.  If you are male: ? After a bowel movement, wipe from front to back and use each piece of toilet paper only once. ? Empty your bladder before and after sex.  Pay attention to any changes in your symptoms. Tell your health care provider about any changes or any new symptoms.  It is your responsibility to get your test results. Ask your health care provider, or the department performing the test, when your results will be ready.  Keep all follow-up visits as told by your health care provider. This is important. Contact a health care provider if:  You develop back pain.  You have a fever.  You have nausea or vomiting.  Your symptoms do not improve after 3 days.  Your symptoms get worse. Get help right away if:  You develop severe vomiting and are unable take medicine without vomiting.  You develop severe pain in your back or abdomen even though you are taking medicine.  You pass a large amount of blood in your urine.  You pass blood clots in your urine.  You feel very weak or like you might faint.  You faint. Summary  Hematuria is blood in the urine. It has many possible causes.  It is very important that you tell your health care provider about any blood in your urine, even if it is painless or the blood stops without treatment.  Take over-the-counter and prescription medicines only as told by your health care provider.  Drink enough fluid to keep   your urine clear or pale yellow. This information is not intended to replace advice given to you by your health care provider. Make sure you discuss any questions you have with your health care provider. Document Revised: 07/02/2018 Document Reviewed: 03/10/2016 Elsevier Patient Education  2020 Elsevier Inc[employer]  

## 2019-12-31 NOTE — Progress Notes (Signed)
Urological Symptom Review  Patient is experiencing the following symptoms: Urinary tract infection   Review of Systems  Gastrointestinal (upper)  : Negative for upper GI symptoms  Gastrointestinal (lower) : Constipation  Constitutional : Negative for symptoms  Skin: Negative for skin symptoms  Eyes: Negative for eye symptoms  Ear/Nose/Throat : Negative for Ear/Nose/Throat symptoms  Hematologic/Lymphatic: Negative for Hematologic/Lymphatic symptoms  Cardiovascular : Negative for cardiovascular symptoms  Respiratory : Negative for respiratory symptoms  Endocrine: Negative for endocrine symptoms  Musculoskeletal: Negative for musculoskeletal symptoms  Neurological: Negative for neurological symptoms  Psychologic: Negative for psychiatric symptoms

## 2019-12-31 NOTE — Progress Notes (Signed)
12/31/2019 10:16 AM   Anthony Dunlap [birth year] [id number]  Referring provider: [name], MD [address] [place],  [place] [address]  Followup hematuria  HPI: Anthony Dunlap is a 86yo here for followup for gross hematuria. He was admitted 1 week ago for worsening gross hematuria. He had a fungal UTI which was treated and urine is now clear. NO pelvic pain. No incontinence    PMH: Past Medical History:  Diagnosis Date  . Anxiety   . Arthritis   . Atrial fibrillation ([place])   . Chronic heel ulcer, left, limited to breakdown of skin ([place]) 10/28/2019  . 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 Diabetes ([place])   . Drug-induced hepatitis 06/05/2018  . Gout   . High cholesterol   . Hypothyroidism   . Inguinal hernia    06/09/2019: per patient " a long time ago on the right side"  . Left eye pain   . Localized osteoarthrosis of right hip 01/05/2019  . Prostate cancer ([place])   . Prostate cancer [facility])     Surgical History: Past Surgical History:  Procedure Laterality Date  . ABDOMINAL AORTOGRAM W/LOWER EXTREMITY Bilateral 08/12/2019   Procedure: ABDOMINAL AORTOGRAM W/LOWER EXTREMITY;  Surgeon: Marty Heck, MD;  Location: [place] [facility];  Service: Cardiovascular;  Laterality: Bilateral;  . ABDOMINAL AORTOGRAM W/LOWER EXTREMITY Left 08/19/2019   Procedure: ABDOMINAL AORTOGRAM W/LOWER EXTREMITY;  Surgeon: Marty Heck, MD;  Location: [place] [facility];  Service: Cardiovascular;  Laterality: Left;  . AMPUTATION Right 09/28/2019   Procedure: AMPUTATION BELOW KNEE;  Surgeon: Rosetta Posner, MD;  Location: [place];  Service: Vascular;  Laterality: Right;  . AMPUTATION Left 11/11/2019   Procedure: AMPUTATION BELOW KNEE;  Surgeon: Rosetta Posner, MD;  [facility];  Service: Vascular;  Laterality: Left;  . ESOPHAGOGASTRODUODENOSCOPY (EGD) WITH PROPOFOL N/A 11/09/2019   Procedure: ESOPHAGOGASTRODUODENOSCOPY (EGD) WITH PROPOFOL;  Surgeon:  Lavena Bullion, DO;  Location: [place];  Service: Gastroenterology;  Laterality: N/A;  . HERNIA REPAIR    . IR RADIOLOGIST EVAL & MGMT  03/06/2018  . PERIPHERAL VASCULAR BALLOON ANGIOPLASTY Left 08/19/2019   Procedure: PERIPHERAL VASCULAR BALLOON ANGIOPLASTY;  Surgeon: Marty Heck, MD;  Location: [place] [facility];  Service: Cardiovascular;  Laterality: Left;  . PROSTATE SURGERY    . TOTAL HIP ARTHROPLASTY Right 06/12/2019   Procedure: RIGHT TOTAL HIP ARTHROPLASTY DIRECT ANTERIOR;  Surgeon: Marybelle Killings, MD;  Location: [place];  Service: Orthopedics;  Laterality: Right;    Home Medications:  Allergies as of 12/31/2019      Reactions   Fluconazole Other (See Comments)   Drug-induced hepatitis      Medication List       Accurate as of December 31, 2019 10:16 AM. If you have any questions, ask your nurse or doctor.        acetaminophen 500 MG tablet Commonly known as: TYLENOL Take 1,000 mg by mouth every 6 (six) hours as needed for mild pain or moderate pain.   ALPRAZolam 0.5 MG tablet Commonly known as: XANAX Take 1 tablet (0.5 mg total) by mouth 2 (two) times daily as needed for anxiety. What changed: when to take this   amiodarone 200 MG tablet Commonly known as: PACERONE Take 200 mg by mouth daily.   amitriptyline 10 MG tablet Commonly known as: ELAVIL Take 20 mg by mouth at bedtime.   apixaban 5 MG Tabs tablet Commonly known as: ELIQUIS Take  1 tablet (5 mg total) by mouth 2 (two) times daily.   clopidogrel 75 MG tablet Commonly known as: Plavix Take 1 tablet (75 mg total) by mouth daily.   colchicine 0.6 MG tablet Take 0.6 mg by mouth daily as needed (gout attacks).   ferrous sulfate 325 (65 FE) MG tablet Take 1 tablet (325 mg total) by mouth 2 (two) times daily with a meal.   insulin aspart 100 UNIT/ML injection Commonly known as: novoLOG Inject 0-15 Units into the skin 3 (three) times daily with meals. Sliding scale insulin Less than 70  initiate hypoglycemia protocol 70-120  0 units 120-150 2 unit 151-200 3 units 201-250 3 units 251-300 5 units 301-350 8 units 351-400 11 units  Greater than 400 15 units , call MD   levothyroxine 100 MCG tablet Commonly known as: SYNTHROID Take 1 tablet (100 mcg total) by mouth daily with breakfast.   metoprolol succinate 100 MG 24 hr tablet Commonly known as: TOPROL-XL Take 1 tablet (100 mg total) by mouth daily. Take with or immediately following a meal.   nystatin powder Commonly known as: MYCOSTATIN/NYSTOP Apply 1 application topically daily as needed (yeast).   nystatin cream Commonly known as: MYCOSTATIN Apply 1 application topically 2 (two) times daily.   pantoprazole 40 MG tablet Commonly known as: PROTONIX Take 1 tablet (40 mg total) by mouth daily.   pravastatin 10 MG tablet Commonly known as: PRAVACHOL Take 10 mg by mouth daily with supper.   primidone 50 MG tablet Commonly known as: MYSOLINE Take by mouth.   senna-docusate 8.6-50 MG tablet Commonly known as: Senokot-S Take 1 tablet by mouth 2 (two) times daily. What changed:   when to take this  reasons to take this   [place] 100-33 UNT-MCG/ML Sopn Generic drug: Insulin Glargine-Lixisenatide Inject 40 Units into the skin daily.   tamsulosin 0.4 MG Caps capsule Commonly known as: FLOMAX Take 0.4 mg by mouth daily.   tobramycin 0.3 % ophthalmic solution Commonly known as: TOBREX Place 1 drop into both eyes in the morning and at bedtime.       Allergies:  Allergies  Allergen Reactions  . Fluconazole Other (See Comments)    Drug-induced hepatitis    Family History: Family History  Problem Relation Age of Onset  . Pneumonia Father   . Cancer Sister     Social History:  reports that he has never smoked. He has never used smokeless tobacco. He reports that he does not drink alcohol and does not use drugs.  ROS: All other review of systems were reviewed and are negative except what is  noted above in HPI  Physical Exam: BP 106/61   Pulse 76   Temp 98.9 F (37.2 C)   Ht 5\' 8"  (1.727 m)   Wt 155 lb (70.3 kg)   BMI 23.57 kg/m   Constitutional:  Alert and oriented, No acute distress. HEENT: [place] AT, moist mucus membranes.  Trachea midline, no masses. Cardiovascular: No clubbing, cyanosis, or edema. Respiratory: Normal respiratory effort, no increased work of breathing. GI: Abdomen is soft, nontender, nondistended, no abdominal masses GU: No CVA tenderness.  Lymph: No cervical or inguinal lymphadenopathy. Skin: No rashes, bruises or suspicious lesions. Neurologic: Grossly intact, no focal deficits, moving all 4 extremities. Psychiatric: Normal mood and affect.  Laboratory Data: Lab Results  Component Value Date   WBC 9.4 12/25/2019   HGB 8.4 (L) 12/25/2019   HCT 28.8 (L) 12/25/2019   MCV 83.5 12/25/2019   PLT 482 (  H) 12/25/2019    Lab Results  Component Value Date   CREATININE 0.74 12/25/2019    No results found for: PSA  No results found for: TESTOSTERONE  Lab Results  Component Value Date   HGBA1C 8.5 (H) 11/05/2019    Urinalysis    Component Value Date/Time   COLORURINE AMBER (A) 12/22/2019 0358   APPEARANCEUR CLEAR 12/22/2019 0358   LABSPEC 1.004 (L) 12/22/2019 0358   PHURINE 7.0 12/22/2019 0358   GLUCOSEU 50 (A) 12/22/2019 0358   HGBUR LARGE (A) 12/22/2019 0358   BILIRUBINUR NEGATIVE 12/22/2019 0358   KETONESUR NEGATIVE 12/22/2019 0358   PROTEINUR 100 (A) 12/22/2019 0358   UROBILINOGEN 0.2 09/03/2013 0132   NITRITE NEGATIVE 12/22/2019 0358   LEUKOCYTESUR TRACE (A) 12/22/2019 0358    Lab Results  Component Value Date   BACTERIA RARE (A) 12/22/2019    Pertinent Imaging:  Results for orders placed during the hospital encounter of 11/05/19  DG Abd 1 View  Narrative CLINICAL DATA:  Follow-up for abdominal distension  EXAM: ABDOMEN - 1 VIEW  COMPARISON:  None.  FINDINGS: Gaseous distension of the small bowel and colon.  Moderate amount of stool throughout the colon. No evidence of pneumoperitoneum, portal venous gas or pneumatosis.  No pathologic calcifications along the expected course of the ureters.  No acute osseous abnormality.  IMPRESSION: Gaseous distension of the small bowel and colon as can be seen with an ileus.   Electronically Signed By: Kathreen Devoid On: 11/08/2019 15:47  No results found for this or any previous visit.  No results found for this or any previous visit.  No results found for this or any previous visit.  No results found for this or any previous visit.  No results found for this or any previous visit.  No results found for this or any previous visit.  No results found for this or any previous visit.   Assessment & Plan:    1. Gross hematuria -resolved after treatment of fungal UTI. -RTC 3 months. He is to return sooner is hematuria returns. COntinue to change foley every 3-4 weeks   No follow-ups on file.  Nicolette Bang, MD  [facility] [place]

## 2020-01-04 ENCOUNTER — Ambulatory Visit: Payer: Medicare Other | Admitting: Infectious Disease

## 2020-01-07 ENCOUNTER — Other Ambulatory Visit: Payer: Self-pay

## 2020-01-07 ENCOUNTER — Ambulatory Visit: Payer: Medicare Other | Admitting: Infectious Disease

## 2020-01-07 VITALS — BP 117/66 | HR 80 | Temp 98.6°F

## 2020-01-07 DIAGNOSIS — N390 Urinary tract infection, site not specified: Secondary | ICD-10-CM

## 2020-01-07 DIAGNOSIS — M86171 Other acute osteomyelitis, right ankle and foot: Secondary | ICD-10-CM

## 2020-01-07 DIAGNOSIS — B952 Enterococcus as the cause of diseases classified elsewhere: Secondary | ICD-10-CM | POA: Diagnosis not present

## 2020-01-07 DIAGNOSIS — R310 Gross hematuria: Secondary | ICD-10-CM

## 2020-01-07 DIAGNOSIS — B965 Pseudomonas (aeruginosa) (mallei) (pseudomallei) as the cause of diseases classified elsewhere: Secondary | ICD-10-CM

## 2020-01-07 DIAGNOSIS — M86659 Other chronic osteomyelitis, unspecified thigh: Secondary | ICD-10-CM | POA: Diagnosis not present

## 2020-01-07 DIAGNOSIS — A414 Sepsis due to anaerobes: Secondary | ICD-10-CM

## 2020-01-07 NOTE — Progress Notes (Signed)
Subjective:  Chief complaint: recently has been more pale  Patient ID: Anthony Dunlap, male    DOB: 12/10/1933, 86 y.o.   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  His course was complicated by intertrigo with candidemia, complicated by Azole induced hepatitis  He is also had bad peripheral vascular disease with wounds.   He had some worsening of wound and I was called on a weekend and added doxycycline prior to his most recent admission.  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  He also has ulcer on opposite heel which progressed.  He apparently was having fevers at home and the home health nurse thought he was pale and did not look well and he was ultimately brought to [facility] via EMS.  In the ER he was found to be in atrial fibrillation with a rapid ventricular response into the 140s.  A CT the abdomen pelvis was done which showed what was thought to be an exudative pericardial effusion.  It also showed his known pubic symphysis osteomyelitis but without any new findings on imaging and that area.  He did have pyuria on examination of his nation of his urine.  Urine cultures and blood cultures were taken.  Blood cultures have been sterile x2 days and the urine culture was not helpful as it grew multiple specimens.  He was  on broad-spectrum antibiotics in the form of vancomycin cefepime and metronidazole.  Blood cultures were negative. MRI of opposite foot showed calcaneal osteomyelitis and he underwent BKA on the left now.  Post BKA antibiotics were stopped.  He has been off antibacterial therapy since September.  He was subsequently admitted with occlusion of catheter which was exchanged and rx with echinocandin for funguria x 3 days.  He currently does not c/o of pelvic or hip pain with hip pain dramatically improved sp THA.  His wounds are healing and he is anxious for prostheses for his legs.      Past Medical History:  Diagnosis Date  . Anxiety   . Arthritis   . Atrial fibrillation ([place])   . Chronic heel ulcer, left, limited to breakdown of skin ([place]) 10/28/2019  . 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 Diabetes ([place])   . Drug-induced hepatitis 06/05/2018  . Gout   . High cholesterol   . Hypothyroidism   . Inguinal hernia    06/09/2019: per patient " a long time ago on the right side"  . Left eye pain   . Localized osteoarthrosis of right hip 01/05/2019  . Prostate cancer ([place])   . Prostate cancer [facility])     Past Surgical History:  Procedure Laterality Date  . ABDOMINAL AORTOGRAM W/LOWER EXTREMITY Bilateral 08/12/2019   Procedure: ABDOMINAL AORTOGRAM W/LOWER EXTREMITY;  Surgeon: Marty Heck, MD;  Location: [place] [facility];  Service: Cardiovascular;  Laterality: Bilateral;  . ABDOMINAL AORTOGRAM W/LOWER EXTREMITY Left 08/19/2019   Procedure: ABDOMINAL AORTOGRAM W/LOWER EXTREMITY;  Surgeon: Marty Heck, MD;  Location: [place] [facility];  Service: Cardiovascular;  Laterality: Left;  . AMPUTATION Right 09/28/2019   Procedure: AMPUTATION  BELOW KNEE;  Surgeon: Rosetta Posner, MD;  Location: [place];  Service: Vascular;  Laterality: Right;  . AMPUTATION Left 11/11/2019   Procedure: AMPUTATION BELOW KNEE;  Surgeon: Rosetta Posner, MD;  [facility];  Service: Vascular;  Laterality: Left;  . ESOPHAGOGASTRODUODENOSCOPY (EGD) WITH PROPOFOL N/A 11/09/2019   Procedure: ESOPHAGOGASTRODUODENOSCOPY (EGD) WITH PROPOFOL;  Surgeon: Lavena Bullion, DO;  Location: [place];  Service:  Gastroenterology;  Laterality: N/A;  . HERNIA REPAIR    . IR RADIOLOGIST EVAL & MGMT  03/06/2018  . PERIPHERAL VASCULAR BALLOON ANGIOPLASTY Left 08/19/2019   Procedure: PERIPHERAL VASCULAR BALLOON ANGIOPLASTY;  Surgeon: Marty Heck, MD;  Location: [place] [facility];  Service: Cardiovascular;  Laterality: Left;  . PROSTATE SURGERY    . TOTAL HIP ARTHROPLASTY Right 06/12/2019   Procedure: RIGHT TOTAL HIP ARTHROPLASTY DIRECT ANTERIOR;  Surgeon: Marybelle Killings, MD;  Location: [place];  Service: Orthopedics;  Laterality: Right;    Family History  Problem Relation Age of Onset  . Pneumonia Father   . Cancer Sister       Social History   Socioeconomic History  . Marital status: Widowed    Spouse name: [name]  . Number of children: 3  . Years of education: 9th  . Highest education level: Not on file  Occupational History    Employer: RETIRED    Comment: Retired  Tobacco Use  . Smoking status: Never Smoker  . Smokeless tobacco: Never Used  Vaping Use  . Vaping Use: Never used  Substance and Sexual Activity  . Alcohol use: No  . Drug use: No  . Sexual activity: Not on file  Other Topics Concern  . Not on file  Social History Narrative   Patient lives at home with his wife. [name]) . Patient is retired.   Education 9th grade.   Right handed.   Caffeine None   Social Determinants of Health   Financial Resource Strain:   . Difficulty of Paying Living Expenses: Not on file  Food Insecurity:   . Worried About Charity fundraiser in the Last Year: Not on file  . Ran Out of Food in the Last Year: Not on file  Transportation Needs:   . Lack of Transportation (Medical): Not on file  . Lack of Transportation (Non-Medical): Not on file  Physical Activity:   . Days of Exercise per Week: Not on file  . Minutes of Exercise per Session: Not on file  Stress:   . Feeling of Stress : Not on file  Social Connections:   . Frequency of Communication with Friends and Family: Not  on file  . Frequency of Social Gatherings with Friends and Family: Not on file  . Attends Religious Services: Not on file  . Active Member of Clubs or Organizations: Not on file  . Attends Archivist Meetings: Not on file  . Marital Status: Not on file    Allergies  Allergen Reactions  . Fluconazole Other (See Comments)    Drug-induced hepatitis     Current Outpatient Medications:  .  acetaminophen (TYLENOL) 500 MG tablet, Take 1,000 mg by mouth every 6 (six) hours as needed for mild pain or moderate pain., Disp: , Rfl:  .  ALPRAZolam (XANAX) 0.5 MG tablet, Take 1 tablet (0.5 mg total) by mouth 2 (two) times daily as needed for anxiety. (Patient taking differently: Take 0.5 mg by mouth at bedtime. ), Disp: 4 tablet, Rfl: 0 .  amiodarone (  PACERONE) 200 MG tablet, Take 200 mg by mouth daily., Disp: , Rfl:  .  amitriptyline (ELAVIL) 10 MG tablet, Take 20 mg by mouth at bedtime. , Disp: , Rfl:  .  apixaban (ELIQUIS) 5 MG TABS tablet, Take 1 tablet (5 mg total) by mouth 2 (two) times daily., Disp: 60 tablet, Rfl: 0 .  clopidogrel (PLAVIX) 75 MG tablet, Take 1 tablet (75 mg total) by mouth daily., Disp: 30 tablet, Rfl: 0 .  colchicine 0.6 MG tablet, Take 0.6 mg by mouth daily as needed (gout attacks). , Disp: , Rfl:  .  ferrous sulfate 325 (65 FE) MG tablet, Take 1 tablet (325 mg total) by mouth 2 (two) times daily with a meal., Disp: , Rfl: 3 .  insulin aspart (NOVOLOG) 100 UNIT/ML injection, Inject 0-15 Units into the skin 3 (three) times daily with meals. Sliding scale insulin Less than 70 initiate hypoglycemia protocol 70-120  0 units 120-150 2 unit 151-200 3 units 201-250 3 units 251-300 5 units 301-350 8 units 351-400 11 units  Greater than 400 15 units , call MD, Disp: 10 mL, Rfl: 11 .  levothyroxine (SYNTHROID) 100 MCG tablet, Take 1 tablet (100 mcg total) by mouth daily with breakfast., Disp: 30 tablet, Rfl: 3 .  metoprolol succinate (TOPROL-XL) 100 MG 24 hr tablet, Take 1  tablet (100 mg total) by mouth daily. Take with or immediately following a meal., Disp: 30 tablet, Rfl: 0 .  nystatin (MYCOSTATIN/NYSTOP) powder, Apply 1 application topically daily as needed (yeast)., Disp: , Rfl:  .  nystatin cream (MYCOSTATIN), Apply 1 application topically 2 (two) times daily., Disp: , Rfl:  .  pantoprazole (PROTONIX) 40 MG tablet, Take 1 tablet (40 mg total) by mouth daily., Disp: , Rfl:  .  pravastatin (PRAVACHOL) 10 MG tablet, Take 10 mg by mouth daily with supper. , Disp: , Rfl:  .  primidone (MYSOLINE) 50 MG tablet, Take by mouth 3 (three) times daily. , Disp: , Rfl:  .  SOLIQUA 100-33 UNT-MCG/ML SOPN, Inject 40 Units into the skin daily. , Disp: , Rfl:  .  tobramycin (TOBREX) 0.3 % ophthalmic solution, Place 1 drop into both eyes in the morning and at bedtime., Disp: , Rfl:  .  senna-docusate (SENOKOT-S) 8.6-50 MG tablet, Take 1 tablet by mouth 2 (two) times daily. (Patient not taking: Reported on 01/07/2020), Disp: , Rfl:  .  tamsulosin (FLOMAX) 0.4 MG CAPS capsule, Take 0.4 mg by mouth daily. (Patient not taking: Reported on 01/07/2020), Disp: , Rfl:    Review of Systems  Unable to perform ROS: Dementia       Objective:   Physical Exam Constitutional:      Appearance: He is well-developed.  HENT:     Head: Normocephalic and atraumatic.  Eyes:     Conjunctiva/sclera: Conjunctivae normal.  Cardiovascular:     Rate and Rhythm: Normal rate and regular rhythm.  Pulmonary:     Effort: Pulmonary effort is normal. No respiratory distress.     Breath sounds: No wheezing.  Abdominal:     General: There is no distension.     Palpations: Abdomen is soft.  Musculoskeletal:        General: No tenderness. Normal range of motion.     Cervical back: Normal range of motion and neck supple.  Skin:    General: Skin is warm and dry.     Coloration: Skin is not pale.     Findings: No erythema or rash.  Neurological:  General: No focal deficit present.     Mental  Status: He is alert.  Psychiatric:        Mood and Affect: Mood normal.        Speech: Speech is delayed.        Behavior: Behavior is cooperative.        Cognition and Memory: Memory is impaired. He exhibits impaired recent memory and impaired remote memory.   Amputation sites:  Left 01/07/2020:    Right 01/07/2020:            Assessment & Plan:   Bilateral calcaneal osteomyelitis sp BKA bilaterally. These are cured  Funguria: no evidence of active infection  Pelvis osteomyelitis: has seemed quiescent and he has been off abx since September. Will observe off of abx and repeat ESR< CRP and assesss ssx in January of 2022

## 2020-01-21 ENCOUNTER — Ambulatory Visit (INDEPENDENT_AMBULATORY_CARE_PROVIDER_SITE_OTHER): Payer: Self-pay | Admitting: Physician Assistant

## 2020-01-21 ENCOUNTER — Other Ambulatory Visit: Payer: Self-pay

## 2020-01-21 VITALS — BP 98/62 | HR 102 | Temp 98.0°F

## 2020-01-21 DIAGNOSIS — Z89511 Acquired absence of right leg below knee: Secondary | ICD-10-CM

## 2020-01-21 DIAGNOSIS — T8189XD Other complications of procedures, not elsewhere classified, subsequent encounter: Secondary | ICD-10-CM

## 2020-01-21 DIAGNOSIS — Z89512 Acquired absence of left leg below knee: Secondary | ICD-10-CM

## 2020-01-21 NOTE — Progress Notes (Signed)
POST OPERATIVE OFFICE NOTE    CC:  F/u for surgery  HPI:  This is a 86 y.o. male who is s/p right below the knee amputation on September 28, 2019 by Dr. Donnetta Hutching and left below the knee amputation on November 11, 2019 also by Dr. Donnetta Hutching.  He presents today for follow-up of delayed healing of both incisions.  He said no evidence infection and had dry eschar of both incisions at last checkup on December 17, 2019.  His son accompanies him today.  No pain, fever or chills.  Is being treated for UTI/hematuria  Allergies  Allergen Reactions  . Fluconazole Other (See Comments)    Drug-induced hepatitis    Current Outpatient Medications  Medication Sig Dispense Refill  . acetaminophen (TYLENOL) 500 MG tablet Take 1,000 mg by mouth every 6 (six) hours as needed for mild pain or moderate pain.    [name] ALPRAZolam (XANAX) 0.5 MG tablet Take 1 tablet (0.5 mg total) by mouth 2 (two) times daily as needed for anxiety. (Patient taking differently: Take 0.5 mg by mouth at bedtime. ) 4 tablet 0  . amiodarone (PACERONE) 200 MG tablet Take 200 mg by mouth daily.    [name] amitriptyline (ELAVIL) 10 MG tablet Take 20 mg by mouth at bedtime.     [name] apixaban (ELIQUIS) 5 MG TABS tablet Take 1 tablet (5 mg total) by mouth 2 (two) times daily. 60 tablet 0  . clopidogrel (PLAVIX) 75 MG tablet Take 1 tablet (75 mg total) by mouth daily. 30 tablet 0  . colchicine 0.6 MG tablet Take 0.6 mg by mouth daily as needed (gout attacks).     . ferrous sulfate 325 (65 FE) MG tablet Take 1 tablet (325 mg total) by mouth 2 (two) times daily with a meal.  3  . insulin aspart (NOVOLOG) 100 UNIT/ML injection Inject 0-15 Units into the skin 3 (three) times daily with meals. Sliding scale insulin Less than 70 initiate hypoglycemia protocol 70-120  0 units 120-150 2 unit 151-200 3 units 201-250 3 units 251-300 5 units 301-350 8 units 351-400 11 units  Greater than 400 15 units , call MD 10 mL 11  . levothyroxine (SYNTHROID) 100 MCG tablet  Take 1 tablet (100 mcg total) by mouth daily with breakfast. 30 tablet 3  . metoprolol succinate (TOPROL-XL) 100 MG 24 hr tablet Take 1 tablet (100 mg total) by mouth daily. Take with or immediately following a meal. 30 tablet 0  . nystatin (MYCOSTATIN/NYSTOP) powder Apply 1 application topically daily as needed (yeast).    . nystatin cream (MYCOSTATIN) Apply 1 application topically 2 (two) times daily.    . pantoprazole (PROTONIX) 40 MG tablet Take 1 tablet (40 mg total) by mouth daily.    . pravastatin (PRAVACHOL) 10 MG tablet Take 10 mg by mouth daily with supper.     . primidone (MYSOLINE) 50 MG tablet Take by mouth 3 (three) times daily.     [name] senna-docusate (SENOKOT-S) 8.6-50 MG tablet Take 1 tablet by mouth 2 (two) times daily. (Patient not taking: Reported on 01/07/2020)    . SOLIQUA 100-33 UNT-MCG/ML SOPN Inject 40 Units into the skin daily.     . tamsulosin (FLOMAX) 0.4 MG CAPS capsule Take 0.4 mg by mouth daily. (Patient not taking: Reported on 01/07/2020)    . tobramycin (TOBREX) 0.3 % ophthalmic solution Place 1 drop into both eyes in the morning and at bedtime.     No current facility-administered medications for this visit.  ROS:  See HPI  There were no vitals taken for this visit.  Physical Exam:  General appearance:Wd, WN in NAD Cardiac: RRR Respiratory: non-labored Extremities:  Adherent eschar approx 5 cm in length of right BKA incision and approximatley one cm on the left. No erythema, tenderness or drainage expressed Neuro: A and O times 4  LEFT    RIGHT     Assessment/Plan:  This is a 86 y.o. male who is s/p: bilateral below knee amputations with delayed incisional healing. No signs of infection. Continues to improve. I don't see record of indwelling stents and patient and son confirm he does not have coronary stents, although they are unsure of stents in peripheral arteries.  They state [name] prescribed his Plavix.  I will discuss with [name]  stopping Plavix in light of hematuria.  Prescription given for [facility]  Follow-up PRN. Call for concerns regarding incisions.   Risa Grill, PA-C [facility] [phone]  Clinic MD:  Oneida Alar

## 2020-01-26 ENCOUNTER — Telehealth: Payer: Self-pay

## 2020-01-26 NOTE — Telephone Encounter (Signed)
Confirmed with [name], patient can stop taking Plavix. Informed caregiver.

## 2020-02-02 ENCOUNTER — Telehealth: Payer: Self-pay

## 2020-02-10 ENCOUNTER — Other Ambulatory Visit: Payer: Self-pay

## 2020-02-11 ENCOUNTER — Telehealth: Payer: Self-pay

## 2020-02-11 NOTE — Telephone Encounter (Signed)
Anthony Dunlap, a [facility] for pt called saying his caregivers said pt had some leaking around the cath. She said she flushed the cath with 30 ccs of water and should she do anymore.  Spoke with RN and she said that was enough. That pt was probably having bladder spasms. HHN said she agreed.

## 2020-02-11 NOTE — Telephone Encounter (Signed)
Patient's caregiver called. Patient is s/p bilateral BKA. Over the last 3-4 days, patient has developed small area on the left side of the R BKA stump incision that is draining green pus. Says it does not smell, stump is slightly red and slightly swollen. Left stump is fine. Patient was put on Keflex yesterday to treat a UTI. Caregiver reports that the pus is no longer draining today, it's a dried layer of green. Denies fever, or general malaise. They are not able to come today, put them on the schedule for wound check Tuesday. Caregiver knows to call urgent care/EMS if patient worsens - develops fever, etc. Instructed to do wet to dry dressing changes 1 - 2 x a day and if dressing becomes saturated. Verbalized understanding.

## 2020-02-16 ENCOUNTER — Other Ambulatory Visit: Payer: Self-pay

## 2020-02-16 ENCOUNTER — Ambulatory Visit (INDEPENDENT_AMBULATORY_CARE_PROVIDER_SITE_OTHER): Payer: Medicare Other | Admitting: Physician Assistant

## 2020-02-16 VITALS — BP 114/61 | HR 61 | Temp 98.3°F | Resp 20

## 2020-02-16 DIAGNOSIS — Z89512 Acquired absence of left leg below knee: Secondary | ICD-10-CM

## 2020-02-16 DIAGNOSIS — Z89511 Acquired absence of right leg below knee: Secondary | ICD-10-CM

## 2020-02-16 NOTE — Progress Notes (Signed)
HISTORY AND PHYSICAL     CC:  follow up Requesting Provider:  [name], MD  HPI: Anthony Dunlap is a 86 y.o. (August 15, 1933) male who is s/p right tBKA on 09/28/2019 by Dr. Donnetta Hutching and left BKA on 11/11/2019 also by Dr. Donnetta Hutching.  He was seen on 01/21/2020 and at that time, he did not have any evidence of infection and had eschar over the incisions.  He was given rx for [employer].    He is followed by infectious disease as pt had osteomyelitis of bilateral calcaneous and he subsequently required bilateral BKA.  He has hx of pelvis osteomyelitis.  He was last seen by ID on 01/07/2020 and at that time, he continued to observe pt off abx and see him back in January 2022.  He was recently placed on abx for UTI.   He returns today for follow up.  His caretaker states that last week, he noticed some green drainage/tissue on the right stump.  He called and they offered to see him that day, however, he was not able to get him here then and presents today for evaluation.   He states there was a scab on the right stump that came off with the dressing.    Caregiver states that Jael has been feeling well.  He has not had any fevers.  He states he was started on Keflex for UTI.  He has a permanent indwelling catheter.  He states he had a pilonidal cyst and this has healed nicely as well.    The pt is on a statin for cholesterol management.  The pt is not on a daily aspirin.   Other AC:  Eliquis.  Pt no longer taking plavix. The pt is not on medication for hypertension.   The pt is diabetic.   Tobacco hx:  never   Past Medical History:  Diagnosis Date  . Anxiety   . Arthritis   . Atrial fibrillation ([place])   . Chronic heel ulcer, left, limited to breakdown of skin ([place]) 10/28/2019  . 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 Diabetes ([place])   . Drug-induced hepatitis 06/05/2018  . Gout   . High cholesterol   . Hypothyroidism   . Inguinal hernia    06/09/2019: per  patient " a long time ago on the right side"  . Left eye pain   . Localized osteoarthrosis of right hip 01/05/2019  . Prostate cancer ([place])   . Prostate cancer [facility])     Past Surgical History:  Procedure Laterality Date  . ABDOMINAL AORTOGRAM W/LOWER EXTREMITY Bilateral 08/12/2019   Procedure: ABDOMINAL AORTOGRAM W/LOWER EXTREMITY;  Surgeon: Marty Heck, MD;  Location: [place] [facility];  Service: Cardiovascular;  Laterality: Bilateral;  . ABDOMINAL AORTOGRAM W/LOWER EXTREMITY Left 08/19/2019   Procedure: ABDOMINAL AORTOGRAM W/LOWER EXTREMITY;  Surgeon: Marty Heck, MD;  Location: [place] [facility];  Service: Cardiovascular;  Laterality: Left;  . AMPUTATION Right 09/28/2019   Procedure: AMPUTATION BELOW KNEE;  Surgeon: Rosetta Posner, MD;  Location: [place];  Service: Vascular;  Laterality: Right;  . AMPUTATION Left 11/11/2019   Procedure: AMPUTATION BELOW KNEE;  Surgeon: Rosetta Posner, MD;  Location: [facility];  Service: Vascular;  Laterality: Left;  . ESOPHAGOGASTRODUODENOSCOPY (EGD) WITH PROPOFOL N/A 11/09/2019   Procedure: ESOPHAGOGASTRODUODENOSCOPY (EGD) WITH PROPOFOL;  Surgeon: Lavena Bullion, DO;  Location: [place];  Service: Gastroenterology;  Laterality: N/A;  . HERNIA REPAIR    . IR  RADIOLOGIST EVAL & MGMT  03/06/2018  . PERIPHERAL VASCULAR BALLOON ANGIOPLASTY Left 08/19/2019   Procedure: PERIPHERAL VASCULAR BALLOON ANGIOPLASTY;  Surgeon: Cephus Shelling, MD;  [facility];  Service: Cardiovascular;  Laterality: Left;  . PROSTATE SURGERY    . TOTAL HIP ARTHROPLASTY Right 06/12/2019   Procedure: RIGHT TOTAL HIP ARTHROPLASTY DIRECT ANTERIOR;  Surgeon: Eldred Manges, MD;  Location: [facility];  Service: Orthopedics;  Laterality: Right;    Social History   Socioeconomic History  . Marital status: Widowed    Spouse name: Talbert Forest  . Number of children: 3  . Years of education: 9th  . Highest education level: Not on file  Occupational History     Employer: RETIRED    Comment: Retired  Tobacco Use  . Smoking status: Never Smoker  . Smokeless tobacco: Never Used  Vaping Use  . Vaping Use: Never used  Substance and Sexual Activity  . Alcohol use: No  . Drug use: No  . Sexual activity: Not on file  Other Topics Concern  . Not on file  Social History Narrative   Patient lives at home with his wife. Talbert Forest) . Patient is retired.   Education 9th grade.   Right handed.   Caffeine None   Social Determinants of Health   Financial Resource Strain: Not on file  Food Insecurity: Not on file  Transportation Needs: Not on file  Physical Activity: Not on file  Stress: Not on file  Social Connections: Not on file  Intimate Partner Violence: Not on file     Family History  Problem Relation Age of Onset  . Pneumonia Father   . Cancer Sister     Current Outpatient Medications  Medication Sig Dispense Refill  . acetaminophen (TYLENOL) 500 MG tablet Take 1,000 mg by mouth every 6 (six) hours as needed for mild pain or moderate pain.    [name] ALPRAZolam (XANAX) 0.5 MG tablet Take 1 tablet (0.5 mg total) by mouth 2 (two) times daily as needed for anxiety. (Patient taking differently: Take 0.5 mg by mouth at bedtime. ) 4 tablet 0  . amiodarone (PACERONE) 200 MG tablet Take 200 mg by mouth 2 (two) times daily.     [name] amitriptyline (ELAVIL) 10 MG tablet Take 20 mg by mouth at bedtime.     [name] apixaban (ELIQUIS) 5 MG TABS tablet Take 1 tablet (5 mg total) by mouth 2 (two) times daily. 60 tablet 0  . clopidogrel (PLAVIX) 75 MG tablet Take 1 tablet (75 mg total) by mouth daily. 30 tablet 0  . colchicine 0.6 MG tablet Take 0.6 mg by mouth daily as needed (gout attacks).     . ferrous sulfate 325 (65 FE) MG tablet Take 1 tablet (325 mg total) by mouth 2 (two) times daily with a meal. (Patient taking differently: Take 325 mg by mouth 3 (three) times daily with meals. )  3  . insulin aspart (NOVOLOG) 100 UNIT/ML injection Inject 0-15 Units into the  skin 3 (three) times daily with meals. Sliding scale insulin Less than 70 initiate hypoglycemia protocol 70-120  0 units 120-150 2 unit 151-200 3 units 201-250 3 units 251-300 5 units 301-350 8 units 351-400 11 units  Greater than 400 15 units , call MD 10 mL 11  . levothyroxine (SYNTHROID) 100 MCG tablet Take 1 tablet (100 mcg total) by mouth daily with breakfast. 30 tablet 3  . metoprolol succinate (TOPROL-XL) 100 MG 24 hr tablet Take 1 tablet (  100 mg total) by mouth daily. Take with or immediately following a meal. 30 tablet 0  . nystatin (MYCOSTATIN/NYSTOP) powder Apply 1 application topically daily as needed (yeast).    . nystatin cream (MYCOSTATIN) Apply 1 application topically 2 (two) times daily.    . pantoprazole (PROTONIX) 40 MG tablet Take 1 tablet (40 mg total) by mouth daily.    . pravastatin (PRAVACHOL) 10 MG tablet Take 10 mg by mouth daily with supper.     . primidone (MYSOLINE) 50 MG tablet Take by mouth 3 (three) times daily.     [name] senna-docusate (SENOKOT-S) 8.6-50 MG tablet Take 1 tablet by mouth 2 (two) times daily. (Patient not taking: Reported on 01/07/2020)    . SOLIQUA 100-33 UNT-MCG/ML SOPN Inject 40 Units into the skin daily.     . tamsulosin (FLOMAX) 0.4 MG CAPS capsule Take 0.4 mg by mouth daily. (Patient not taking: Reported on 01/07/2020)    . tobramycin (TOBREX) 0.3 % ophthalmic solution Place 1 drop into both eyes in the morning and at bedtime.     No current facility-administered medications for this visit.    Allergies  Allergen Reactions  . Fluconazole Other (See Comments)    Drug-induced hepatitis     REVIEW OF SYSTEMS:   [X]  denotes positive finding, [ ]  denotes negative finding Cardiac  Comments:  Chest pain or chest pressure:    Shortness of breath upon exertion:    Short of breath when lying flat:    Irregular heart rhythm:        Vascular    Pain in calf, thigh, or hip brought on by ambulation:    Pain in feet at night that wakes you  up from your sleep:     Blood clot in your veins:    Leg swelling:         Pulmonary    Oxygen at home:    Productive cough:     Wheezing:         Neurologic    Sudden weakness in arms or legs:     Sudden numbness in arms or legs:     Sudden onset of difficulty speaking or slurred speech:    Temporary loss of vision in one eye:     Problems with dizziness:         Gastrointestinal    Blood in stool:     Vomited blood:         Genitourinary    Burning when urinating:     Blood in urine:        Psychiatric    Major depression:         Hematologic    Bleeding problems:    Problems with blood clotting too easily:        Skin    Rashes or ulcers:        Constitutional    Fever or chills:      PHYSICAL EXAMINATION:  Today's Vitals   02/16/20 1036  BP: 114/61  Pulse: 61  Resp: 20  Temp: 98.3 F (36.8 C)  TempSrc: Temporal  SpO2: 96%   There is no height or weight on file to calculate BMI.   General:  WDWN in NAD; vital signs documented above Gait: in wheelchair HENT: WNL, normocephalic Pulmonary: normal non-labored breathing Skin: without rashes Vascular Exam/Pulses: Extremities:   Right stump prior to debridement.   Left stump   Right stump after debridement     Musculoskeletal: no muscle wasting  or atrophy  Neurologic: A&O X 3;  No focal weakness or paresthesias are detected Psychiatric:  The pt has Normal affect.    ASSESSMENT/PLAN:: 86 y.o. male who is s/p right tBKA on 09/28/2019 by Dr. Donnetta Hutching and left BKA on 11/11/2019 also by Dr. Donnetta Hutching who presents today for evaluation of non healing area on right BKA stump (see above pictures)   -no evidence of infection and he has not had any fevers.  He has a week of Keflex left to take for UTI.   There was fibrinous tissue over this area with scab and this was sharply debrided today in the office.  Will continue with wet to dry saline dressing changes bid and the caretaker knows was instructed on  this. -will have him f/u in a couple of weeks for wound check.  They will call sooner if there are any issues.    Leontine Locket, [facility] [phone]  Clinic MD:   Stanford Breed

## 2020-02-17 ENCOUNTER — Telehealth: Payer: Self-pay | Admitting: *Deleted

## 2020-02-17 NOTE — Telephone Encounter (Signed)
Finn's caregiver Ramon Dredge called.  Arnol was seen yesterday by [facility], is feeling well. Onalee Hua and Ramon Dredge would like to see if he can have labs drawn by home health and follow up virtually on 1/6 with Dr Daiva Eves instead of having to load him up in the truck and come out in public for an in-person visit.   RN changed appointment type. Kebin prefers a call to his home number rather than trying to figure out the video visit through [place]. Ramon Dredge will be available as well in case Cal[place] has any difficult hearing or answering questions.   Patient has home health throught [facility] Kathie Rhodes) [phone] to be drawn either before or after the office visit 1/6. Andree Coss, RN

## 2020-02-21 NOTE — Telephone Encounter (Signed)
Thanks Marcelino Duster that works for me

## 2020-02-22 NOTE — Telephone Encounter (Signed)
Would you want home health to draw any labs prior to visit 1/6?

## 2020-02-22 NOTE — Telephone Encounter (Signed)
Thanks so much Michelle 

## 2020-02-22 NOTE — Telephone Encounter (Signed)
They could do an ESR CRP CBC and BMP

## 2020-02-22 NOTE — Telephone Encounter (Signed)
Relayed lab orders to [facility], Doctor, general practice.  These should be collected 1/4 or 1/5, results faxed to [phone].

## 2020-02-25 ENCOUNTER — Other Ambulatory Visit: Payer: Self-pay

## 2020-02-25 ENCOUNTER — Telehealth (INDEPENDENT_AMBULATORY_CARE_PROVIDER_SITE_OTHER): Payer: Medicare Other | Admitting: Infectious Disease

## 2020-02-25 DIAGNOSIS — M86172 Other acute osteomyelitis, left ankle and foot: Secondary | ICD-10-CM | POA: Diagnosis not present

## 2020-02-25 DIAGNOSIS — A414 Sepsis due to anaerobes: Secondary | ICD-10-CM

## 2020-02-25 DIAGNOSIS — M86659 Other chronic osteomyelitis, unspecified thigh: Secondary | ICD-10-CM

## 2020-02-25 DIAGNOSIS — M86171 Other acute osteomyelitis, right ankle and foot: Secondary | ICD-10-CM | POA: Diagnosis not present

## 2020-02-25 DIAGNOSIS — I739 Peripheral vascular disease, unspecified: Secondary | ICD-10-CM

## 2020-02-25 DIAGNOSIS — B952 Enterococcus as the cause of diseases classified elsewhere: Secondary | ICD-10-CM

## 2020-02-25 NOTE — Progress Notes (Signed)
Virtual Visit via Telephone Note  I connected with Anthony Dunlap on 02/25/20 at 11:15 AM EST by telephone and verified that I am speaking with the correct person using two identifiers.  Location: Patient: Home Provider: [facility]   I discussed the limitations, risks, security and privacy concerns of performing an evaluation and management service by telephone and the availability of in person appointments. I also discussed with the patient that there may be a patient responsible charge related to this service. The patient expressed understanding and agreed to proceed.   History of Present Illness:  74 yomalewithhx ofpolymicrobial infection with pelvic osteomyelitis of the pubic symphysis and abscess in the adductor sheath He had grownenterococcus faecalis that was sensitive to ampicillin and a fairly sensitive Klebsiella pneumoniae anda bacteroides  After receiving parenteral antibiotics he was transitioned to chronic Augmentin.  His course was complicated by intertrigo with candidemia, complicated by Azole induced hepatitis  He is also had bad peripheral vascular disease with wounds.   He had some worsening of wound and I was called on a weekend and added doxycyclineprior to his most recent admission.  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  He also has ulcer on opposite heel which progressed.  He apparently was having fevers at home and the home health nurse thought he was pale and did not look well and he was ultimately brought to [facility] via EMS. In the ER he was found to be in atrial fibrillation with a rapid ventricular response into the 140s. A CT the abdomen pelvis was done which showed what was thought to be an exudative pericardial effusion.  It also showed his known pubic symphysis osteomyelitis but without any new findings on imaging and that area.  He did have  pyuria on examination of his nation of his urine. Urine cultures and blood cultures were taken. Blood cultures have been sterile x2 days and the urine culture was not helpful as it grew multiple specimens.  He was  on broad-spectrum antibiotics in the form of vancomycin cefepime and metronidazole.  Blood cultures were negative. MRI of opposite foot showed calcaneal osteomyelitis and he underwent BKA on the left now.  Post BKA antibiotics were stopped. He has been off antibacterial therapy since September.  He was subsequently admitted with occlusion of catheter which was exchanged and rx with echinocandin for funguria x 3 days.  His hip pain dramatically improved sp THA.  I saw him in November in person and we elected to continue to observe him off antibiotics which we have done  He could not come to clinic today so we obtained labs via [facility]  ESR is down to 70 and CRP to 15  He has no pelvic pain. Hip pain better and amputation site healing up nicely.   Past Medical History:  Diagnosis Date  . Anxiety   . Arthritis   . Atrial fibrillation ([name])   . Chronic heel ulcer, left, limited to breakdown of skin ([name]) 10/28/2019  . 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 Diabetes ([name])   . Drug-induced hepatitis 06/05/2018  . Gout   . High cholesterol   . Hypothyroidism   . Inguinal hernia    06/09/2019: per patient " a long time ago on the right side"  . Left eye pain   . Localized osteoarthrosis of right hip 01/05/2019  . Prostate  cancer ([name])   . Prostate cancer [name])     Past Surgical History:  Procedure Laterality Date  . ABDOMINAL AORTOGRAM W/LOWER EXTREMITY Bilateral 08/12/2019   Procedure: ABDOMINAL AORTOGRAM W/LOWER EXTREMITY;  Surgeon: Marty Heck, MD;  Location: [place] [facility];  Service: Cardiovascular;  Laterality: Bilateral;  . ABDOMINAL AORTOGRAM W/LOWER EXTREMITY Left 08/19/2019   Procedure: ABDOMINAL AORTOGRAM  W/LOWER EXTREMITY;  Surgeon: Marty Heck, MD;  Location: [place] [facility];  Service: Cardiovascular;  Laterality: Left;  . AMPUTATION Right 09/28/2019   Procedure: AMPUTATION BELOW KNEE;  Surgeon: Rosetta Posner, MD;  Location: [place];  Service: Vascular;  Laterality: Right;  . AMPUTATION Left 11/11/2019   Procedure: AMPUTATION BELOW KNEE;  Surgeon: Rosetta Posner, MD;  Location: [facility];  Service: Vascular;  Laterality: Left;  . ESOPHAGOGASTRODUODENOSCOPY (EGD) WITH PROPOFOL N/A 11/09/2019   Procedure: ESOPHAGOGASTRODUODENOSCOPY (EGD) WITH PROPOFOL;  Surgeon: Lavena Bullion, DO;  Location: [place];  Service: Gastroenterology;  Laterality: N/A;  . HERNIA REPAIR    . IR RADIOLOGIST EVAL & MGMT  03/06/2018  . PERIPHERAL VASCULAR BALLOON ANGIOPLASTY Left 08/19/2019   Procedure: PERIPHERAL VASCULAR BALLOON ANGIOPLASTY;  Surgeon: Marty Heck, MD;  Location: [place] [facility];  Service: Cardiovascular;  Laterality: Left;  . PROSTATE SURGERY    . TOTAL HIP ARTHROPLASTY Right 06/12/2019   Procedure: RIGHT TOTAL HIP ARTHROPLASTY DIRECT ANTERIOR;  Surgeon: Marybelle Killings, MD;  Location: [place];  Service: Orthopedics;  Laterality: Right;    Family History  Problem Relation Age of Onset  . Pneumonia Father   . Cancer Sister       Social History   Socioeconomic History  . Marital status: Widowed    Spouse name: [name]  . Number of children: 3  . Years of education: 9th  . Highest education level: Not on file  Occupational History    Employer: RETIRED    Comment: Retired  Tobacco Use  . Smoking status: Never Smoker  . Smokeless tobacco: Never Used  Vaping Use  . Vaping Use: Never used  Substance and Sexual Activity  . Alcohol use: No  . Drug use: No  . Sexual activity: Not on file  Other Topics Concern  . Not on file  Social History Narrative   Patient lives at home with his wife. [name]) . Patient is retired.   Education 9th grade.   Right handed.   Caffeine  None   Social Determinants of Health   Financial Resource Strain: Not on file  Food Insecurity: Not on file  Transportation Needs: Not on file  Physical Activity: Not on file  Stress: Not on file  Social Connections: Not on file    Allergies  Allergen Reactions  . Fluconazole Other (See Comments)    Drug-induced hepatitis     Current Outpatient Medications:  .  acetaminophen (TYLENOL) 500 MG tablet, Take 1,000 mg by mouth every 6 (six) hours as needed for mild pain or moderate pain., Disp: , Rfl:  .  ALPRAZolam (XANAX) 0.5 MG tablet, Take 1 tablet (0.5 mg total) by mouth 2 (two) times daily as needed for anxiety. (Patient taking differently: Take 0.5 mg by mouth at bedtime. ), Disp: 4 tablet, Rfl: 0 .  amiodarone (PACERONE) 200 MG tablet, Take 200 mg by mouth 2 (two) times daily. , Disp: , Rfl:  .  amitriptyline (ELAVIL) 10 MG tablet, Take 20 mg by mouth at bedtime. , Disp: , Rfl:  .  apixaban (  ELIQUIS) 5 MG TABS tablet, Take 1 tablet (5 mg total) by mouth 2 (two) times daily., Disp: 60 tablet, Rfl: 0 .  clopidogrel (PLAVIX) 75 MG tablet, Take 1 tablet (75 mg total) by mouth daily., Disp: 30 tablet, Rfl: 0 .  colchicine 0.6 MG tablet, Take 0.6 mg by mouth daily as needed (gout attacks). , Disp: , Rfl:  .  ferrous sulfate 325 (65 FE) MG tablet, Take 1 tablet (325 mg total) by mouth 2 (two) times daily with a meal. (Patient taking differently: Take 325 mg by mouth 3 (three) times daily with meals. ), Disp: , Rfl: 3 .  insulin aspart (NOVOLOG) 100 UNIT/ML injection, Inject 0-15 Units into the skin 3 (three) times daily with meals. Sliding scale insulin Less than 70 initiate hypoglycemia protocol 70-120  0 units 120-150 2 unit 151-200 3 units 201-250 3 units 251-300 5 units 301-350 8 units 351-400 11 units  Greater than 400 15 units , call MD, Disp: 10 mL, Rfl: 11 .  levothyroxine (SYNTHROID) 100 MCG tablet, Take 1 tablet (100 mcg total) by mouth daily with breakfast., Disp: 30 tablet, Rfl:  3 .  metoprolol succinate (TOPROL-XL) 100 MG 24 hr tablet, Take 1 tablet (100 mg total) by mouth daily. Take with or immediately following a meal., Disp: 30 tablet, Rfl: 0 .  nystatin (MYCOSTATIN/NYSTOP) powder, Apply 1 application topically daily as needed (yeast)., Disp: , Rfl:  .  nystatin cream (MYCOSTATIN), Apply 1 application topically 2 (two) times daily., Disp: , Rfl:  .  pantoprazole (PROTONIX) 40 MG tablet, Take 1 tablet (40 mg total) by mouth daily., Disp: , Rfl:  .  pravastatin (PRAVACHOL) 10 MG tablet, Take 10 mg by mouth daily with supper. , Disp: , Rfl:  .  primidone (MYSOLINE) 50 MG tablet, Take by mouth 3 (three) times daily. , Disp: , Rfl:  .  senna-docusate (SENOKOT-S) 8.6-50 MG tablet, Take 1 tablet by mouth 2 (two) times daily. (Patient not taking: Reported on 01/07/2020), Disp: , Rfl:  .  SOLIQUA 100-33 UNT-MCG/ML SOPN, Inject 40 Units into the skin daily. , Disp: , Rfl:  .  tamsulosin (FLOMAX) 0.4 MG CAPS capsule, Take 0.4 mg by mouth daily. (Patient not taking: Reported on 01/07/2020), Disp: , Rfl:  .  tobramycin (TOBREX) 0.3 % ophthalmic solution, Place 1 drop into both eyes in the morning and at bedtime., Disp: , Rfl:     Observations/Objective:  Shaquel seemed to be doing well and stable clinically over the phone  Assessment and Plan:  Pelvis osteomyelitis: seems to be qiescent still and will observe off abx  Candida infection and candidemia: less likely to be an issue off abx. Remember NO AZOLES  Azole induced hepatitis: do not give azoles for any fungal infection to him  Bilateral calcaneal osteomyelitis: sp BKA bilaterally and healing well  Follow Up Instructions:    I discussed the assessment and treatment plan with the patient. The patient was provided an opportunity to ask questions and all were answered. The patient agreed with the plan and demonstrated an understanding of the instructions.   The patient was advised to call back or seek an in-person  evaluation if the symptoms worsen or if the condition fails to improve as anticipated.  I provided 12 minutes of non-face-to-face time during this encounter.   Alcide Evener, MD

## 2020-03-03 ENCOUNTER — Ambulatory Visit: Payer: Medicare Other

## 2020-03-10 IMAGING — CT CT ABD-PELV W/ CM
2 of 4 series · 14 of 46 positions shown, 16 images · IV contrast (Isovue)
Comparison: None.

CLINICAL DATA: 84-year-old male presents with fever and tachycardia
with anaerobic bacteremia. Abdominal pain and fever with abscess
suspected.

EXAM:
CT ABDOMEN AND PELVIS WITH CONTRAST
TECHNIQUE: Multidetector CT imaging of the abdomen and pelvis was performed
using the standard protocol following bolus administration of
intravenous contrast.
CONTRAST:  100mL [id number] IOPAMIDOL ([id number]) INJECTION 61%

[Series 2: axial st · axial · 0.90mm/px · z∈[-530,-110]mm · 11 of 94 slices shown, 13 images]
[im 5/94  soft-tissue]
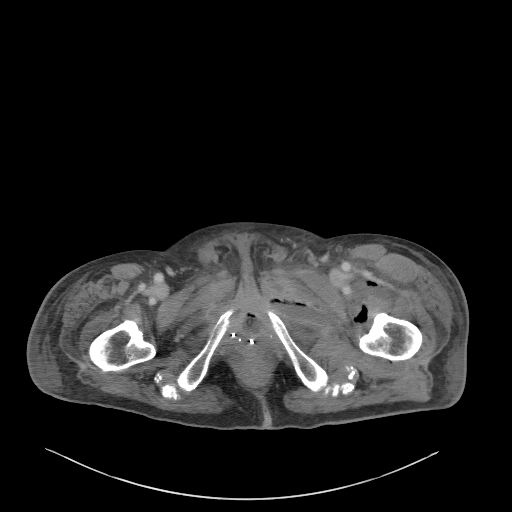
[im 5/94  bone]
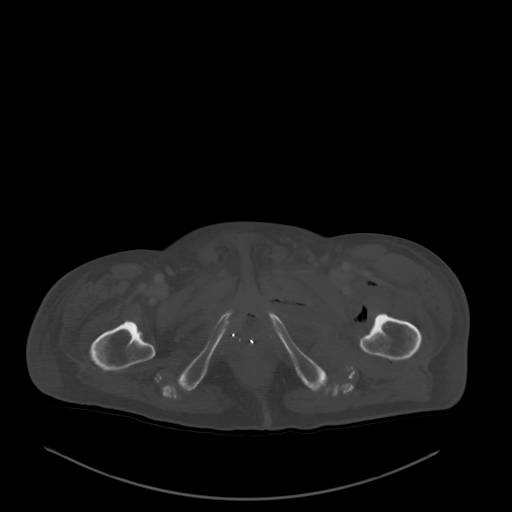
[im 14/94  soft-tissue]
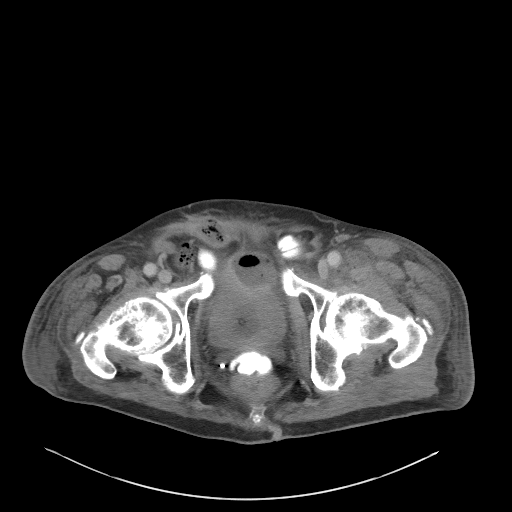
[im 23/94  soft-tissue]
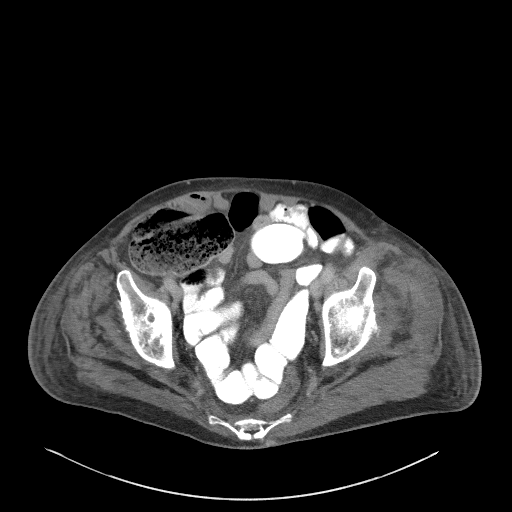
[im 32/94  soft-tissue]
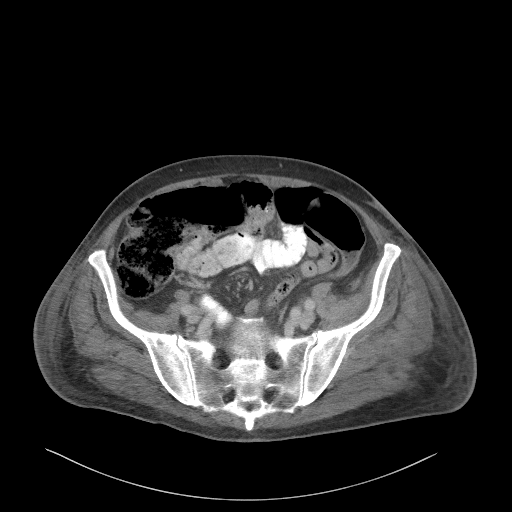
[im 40/94  soft-tissue]
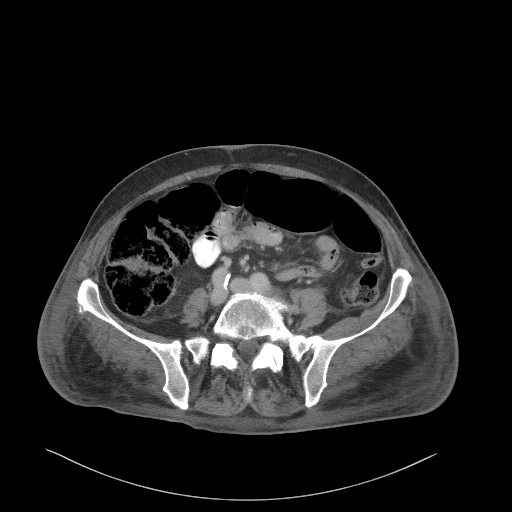
[im 49/94  soft-tissue]
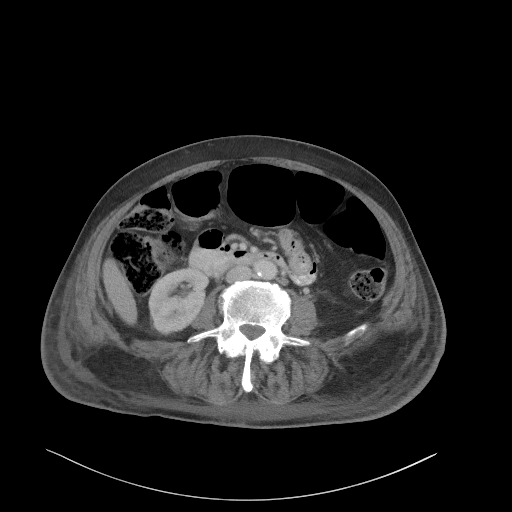
[im 54/94  soft-tissue]
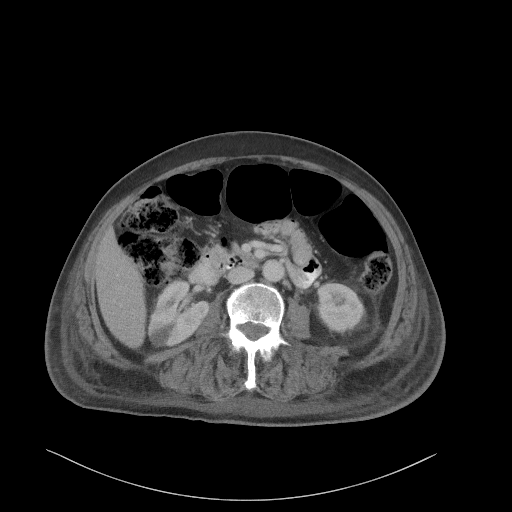
[im 63/94  soft-tissue]
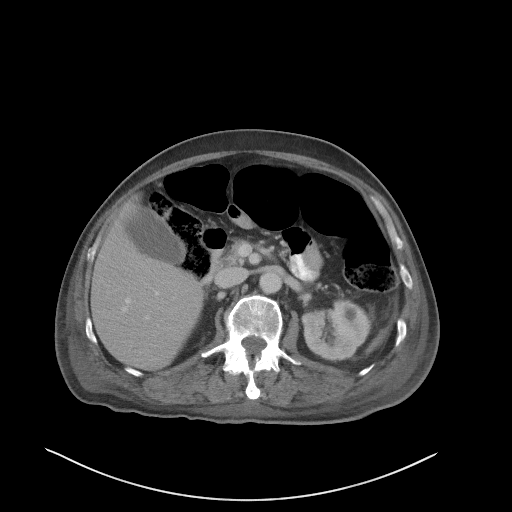
[im 71/94  soft-tissue]
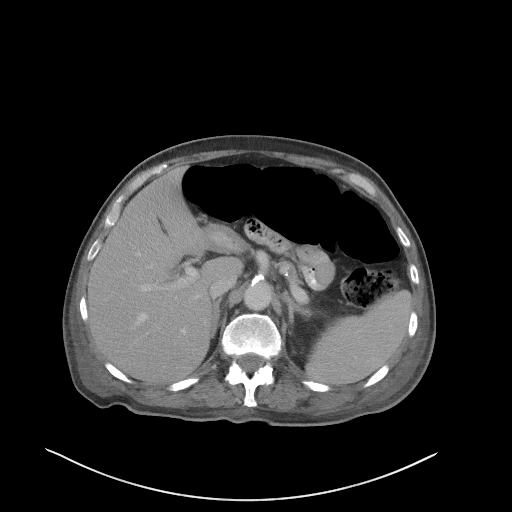
[im 71/94  bone]
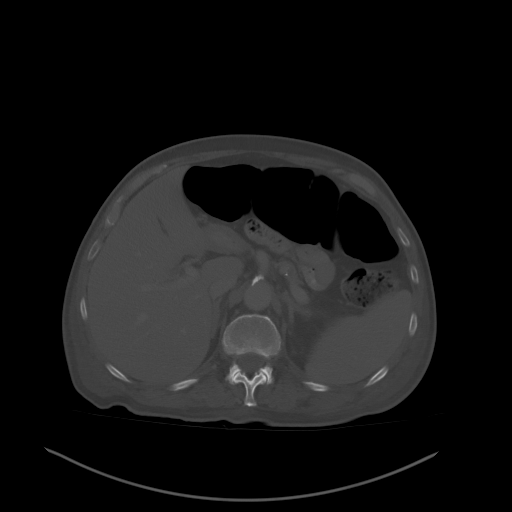
[im 80/94  soft-tissue]
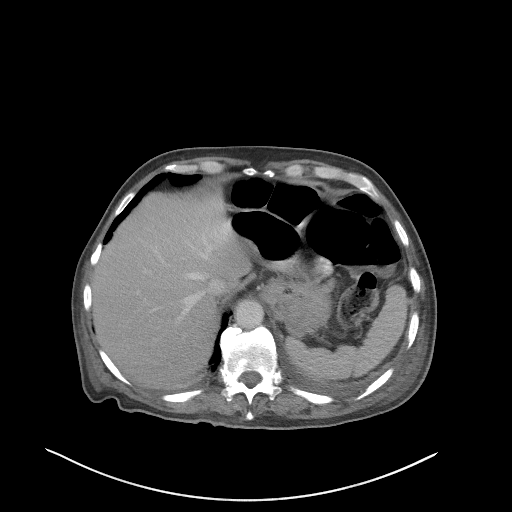
[im 89/94  soft-tissue]
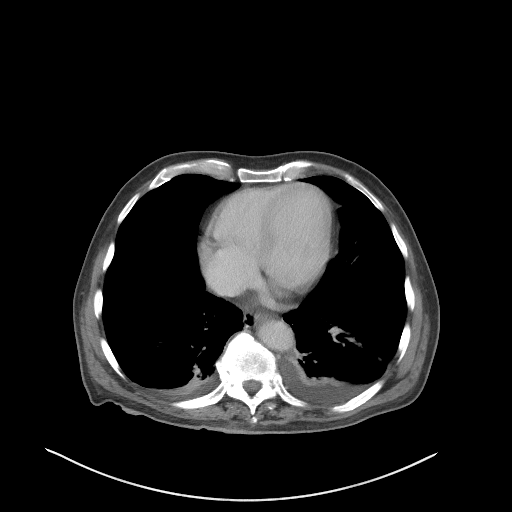

[Series 5: coronal st · coronal · 0.82mm/px · 3 of 92 slices shown]
[im 31/92  soft-tissue]
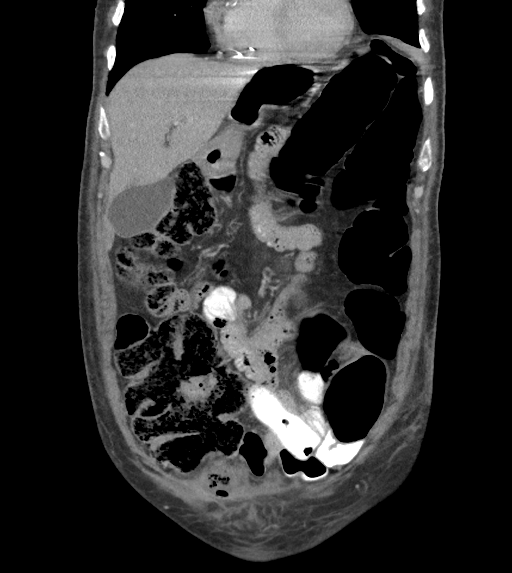
[im 41/92  soft-tissue]
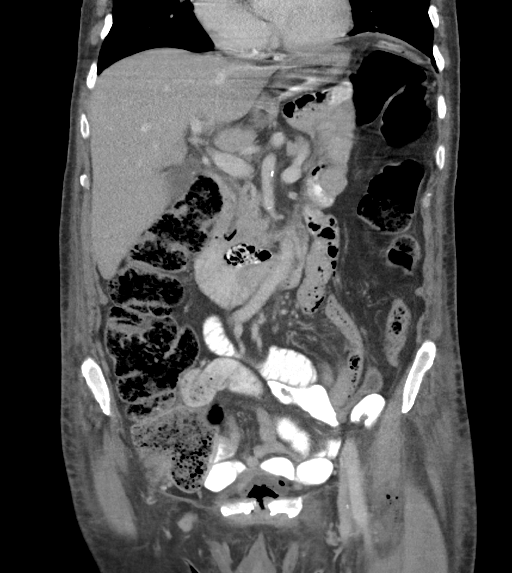
[im 51/92  soft-tissue]
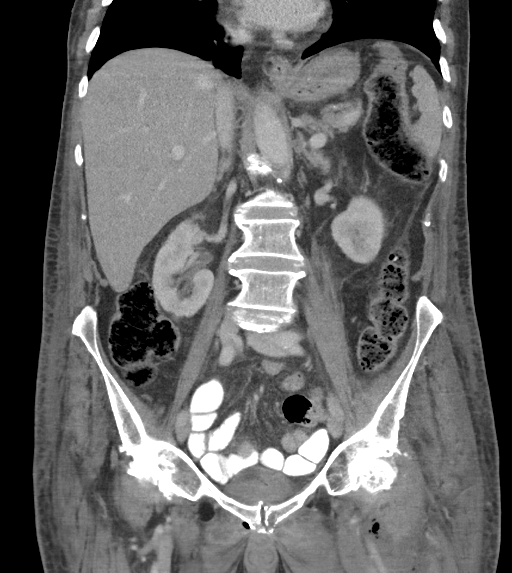

[14 of 46 positions shown; findings below may reference images not displayed]

FINDINGS: Lower chest: Heart size is top normal without pericardial effusion.
Trace bilateral pleural effusions with adjacent atelectasis are
identified at each lung base, left slightly greater than right.

Hepatobiliary: 8 mm hypodensity in the subcapsular right hepatic
lobe, too small to characterize but may reflect a small cyst or
hemangioma. No enhancing mass or biliary dilatation. No abscess. The
gallbladder is unremarkable and free of stones.

Pancreas: Atrophic pancreas. No mass or ductal dilatation.
Inflammation.

Spleen: Normal size spleen without focal mass.

Adrenals/Urinary Tract: Normal bilateral adrenal glands. Symmetric
cortical enhancement both kidneys with bilateral renal cysts. No
nephrolithiasis nor hydroureteronephrosis. The urinary bladder is
decompressed by Foley catheter.

Stomach/Bowel: Small hiatal hernia is noted. The stomach is
nondistended. Small bowel rotation is within limits. Enteric
contrast is seen within jejunal loops without mechanical bowel
obstruction or inflammation. Moderate stool retention is seen within
the cecum and ascending colon with moderate stool also noted in the
descending colon. No mural thickening is identified. The
rectosigmoid is decompressed in appearance and likely explains the
slightly thickened appearance. The appendix is not well visualized.

Vascular/Lymphatic: Mild scattered aortoiliac atherosclerosis
without aneurysm. Mild reactive adenopathy suspected of the window
nodes without significant pathologic enlargement. The largest
contains fat and measure up to 12 mm on right and 10 mm on the left.

Reproductive: Brachy therapy seeds are imbedded within the prostate.

Other: Soft tissue abscesses are identified about the pelvis and
left hip. Mottled gas like lucency with low attenuating fluid is
noted along the course of the distal right rectus muscle measuring
7.4 x 2.5 x 3.2 cm, series [DATE] and series [DATE].

Additional tracking low-density fluid with mottled gas like
lucencies are noted along the course of the left iliopsoas muscle
extending to the left hip. Intramuscular abscesses are noted of the
pectineus and obturator externus muscles bilaterally, left more
prominent than right as well as left quadratus femoris muscle. Soft
tissue abscess contiguous in adjacent to pubic symphysis is
identified, series [DATE] measuring 3.3 x 4.4 x 4.1 cm.

Soft tissue anasarca is noted of the included abdomen pelvis. Small
low-density subcutaneous fluid collections are noted along the
lateral aspect of both hips without gas like lucencies, right
measuring at least 3.7 x 3 cm and on the left 5.5 by 1.9 cm, series
[DATE].

Musculoskeletal: Subtle cortical bone loss and osteopenia noted
along the inferior pubic bone bilaterally. Changes of osteomyelitis
is of concern, series [DATE] on bone windows possibly from septic
arthritis given presence of adjacent abscess.
IMPRESSION: 1. The over arching finding is that of numerous intramuscular
abscesses as stated above along the course of the distal right
rectus, about the bilateral pectineus, obturator externus and left
quadratus femoris muscles as well as left ileo psoas.
2. Small abscess arising off the pubic symphysis is also noted with
subtle bone loss and cortical bone destruction raising concern for
septic arthritis and osteomyelitis of the pubic bone.
3. Left greater than right small pleural effusions with atelectasis.
4. Bilateral renal cysts.

These results were called by telephone at the time of interpretation
on 01/22/2018 at [DATE] to NP Niko-Matti Shingh, who verbally
acknowledged these results.

## 2020-03-14 ENCOUNTER — Telehealth: Payer: Self-pay

## 2020-03-15 ENCOUNTER — Other Ambulatory Visit: Payer: Self-pay

## 2020-03-15 NOTE — Telephone Encounter (Signed)
Mirabegron 25mg 

## 2020-03-15 NOTE — Telephone Encounter (Signed)
Medication was flagged due to pt having diagnosis of QT interval prolongation or [place] Point[place] and shouldn't receive drugs that prolong the QT interval. Please advise.

## 2020-03-15 NOTE — Progress Notes (Addendum)
Opened in error

## 2020-03-16 ENCOUNTER — Other Ambulatory Visit: Payer: Self-pay

## 2020-03-16 DIAGNOSIS — N3289 Other specified disorders of bladder: Secondary | ICD-10-CM

## 2020-03-16 DIAGNOSIS — R310 Gross hematuria: Secondary | ICD-10-CM

## 2020-03-16 MED ORDER — GEMTESA 75 MG PO TABS: 1.0000 | ORAL_TABLET | Freq: Every day | ORAL | 11 refills | Status: DC

## 2020-03-16 NOTE — Telephone Encounter (Signed)
Rx sent for Anthony Dunlap, pt caretaker Percell Miller called and made aware.

## 2020-03-21 NOTE — Telephone Encounter (Signed)
resolved 

## 2020-04-01 ENCOUNTER — Ambulatory Visit: Payer: Medicare Other | Admitting: Urology

## 2020-04-06 ENCOUNTER — Other Ambulatory Visit: Payer: Self-pay | Admitting: Infectious Disease

## 2020-04-14 ENCOUNTER — Other Ambulatory Visit: Payer: Self-pay | Admitting: Infectious Disease

## 2020-04-18 ENCOUNTER — Emergency Department (HOSPITAL_COMMUNITY)
Admission: EM | Admit: 2020-04-18 | Discharge: 2020-04-19 | Disposition: A | Discharge status: Home / Self Care | Payer: Medicare Other | Attending: Emergency Medicine | Admitting: Emergency Medicine

## 2020-04-18 ENCOUNTER — Emergency Department (HOSPITAL_COMMUNITY): Payer: Medicare Other

## 2020-04-18 ENCOUNTER — Encounter (HOSPITAL_COMMUNITY): Payer: Self-pay

## 2020-04-18 ENCOUNTER — Other Ambulatory Visit: Payer: Self-pay

## 2020-04-18 DIAGNOSIS — I483 Typical atrial flutter: Secondary | ICD-10-CM | POA: Insufficient documentation

## 2020-04-18 DIAGNOSIS — E039 Hypothyroidism, unspecified: Secondary | ICD-10-CM | POA: Diagnosis not present

## 2020-04-18 DIAGNOSIS — K5900 Constipation, unspecified: Secondary | ICD-10-CM | POA: Diagnosis not present

## 2020-04-18 DIAGNOSIS — Z79899 Other long term (current) drug therapy: Secondary | ICD-10-CM | POA: Diagnosis not present

## 2020-04-18 DIAGNOSIS — Z794 Long term (current) use of insulin: Secondary | ICD-10-CM | POA: Diagnosis not present

## 2020-04-18 DIAGNOSIS — Z8546 Personal history of malignant neoplasm of prostate: Secondary | ICD-10-CM | POA: Insufficient documentation

## 2020-04-18 DIAGNOSIS — E119 Type 2 diabetes mellitus without complications: Secondary | ICD-10-CM | POA: Insufficient documentation

## 2020-04-18 DIAGNOSIS — Z96641 Presence of right artificial hip joint: Secondary | ICD-10-CM | POA: Insufficient documentation

## 2020-04-18 DIAGNOSIS — Z7901 Long term (current) use of anticoagulants: Secondary | ICD-10-CM | POA: Diagnosis not present

## 2020-04-18 LAB — CBC WITH DIFFERENTIAL/PLATELET
Abs Immature Granulocytes: 0.04 10*3/uL (ref 0.00–0.07)
Basophils Absolute: 0 10*3/uL (ref 0.0–0.1)
Basophils Relative: 1 %
Eosinophils Absolute: 0.5 10*3/uL (ref 0.0–0.5)
Eosinophils Relative: 7 %
HCT: 30.1 % — ABNORMAL LOW (ref 39.0–52.0)
Hemoglobin: 9 g/dL — ABNORMAL LOW (ref 13.0–17.0)
Immature Granulocytes: 1 %
Lymphocytes Relative: 20 %
Lymphs Abs: 1.5 10*3/uL (ref 0.7–4.0)
MCH: 25.4 pg — ABNORMAL LOW (ref 26.0–34.0)
MCHC: 29.9 g/dL — ABNORMAL LOW (ref 30.0–36.0)
MCV: 85 fL (ref 80.0–100.0)
Monocytes Absolute: 0.6 10*3/uL (ref 0.1–1.0)
Monocytes Relative: 8 %
Neutro Abs: 4.8 10*3/uL (ref 1.7–7.7)
Neutrophils Relative %: 63 %
Platelets: 305 10*3/uL (ref 150–400)
RBC: 3.54 MIL/uL — ABNORMAL LOW (ref 4.22–5.81)
RDW: 20 % — ABNORMAL HIGH (ref 11.5–15.5)
WBC: 7.5 10*3/uL (ref 4.0–10.5)
nRBC: 0 % (ref 0.0–0.2)

## 2020-04-18 LAB — URINALYSIS, ROUTINE W REFLEX MICROSCOPIC
Bilirubin Urine: NEGATIVE
Glucose, UA: NEGATIVE mg/dL
Ketones, ur: NEGATIVE mg/dL
Nitrite: NEGATIVE
Protein, ur: 100 mg/dL — AB
Specific Gravity, Urine: 1.013 (ref 1.005–1.030)
WBC, UA: >50 WBC/hpf — ABNORMAL HIGH (ref 0–5)
pH: 5 (ref 5.0–8.0)

## 2020-04-18 LAB — BASIC METABOLIC PANEL
Anion gap: 9 (ref 5–15)
BUN: 28 mg/dL — ABNORMAL HIGH (ref 8–23)
CO2: 24 mmol/L (ref 22–32)
Calcium: 8.5 mg/dL — ABNORMAL LOW (ref 8.9–10.3)
Chloride: 100 mmol/L (ref 98–111)
Creatinine, Ser: 1.14 mg/dL (ref 0.61–1.24)
GFR, Estimated: >60 mL/min (ref >60)
Glucose, Bld: 161 mg/dL — ABNORMAL HIGH (ref 70–99)
Potassium: 4.4 mmol/L (ref 3.5–5.1)
Sodium: 133 mmol/L — ABNORMAL LOW (ref 135–145)

## 2020-04-18 LAB — CBG MONITORING, ED: Glucose-Capillary: 153 mg/dL — ABNORMAL HIGH (ref 70–99)

## 2020-04-18 MED ORDER — IOHEXOL 300 MG/ML  SOLN
100.0000 mL | Freq: Once | INTRAMUSCULAR | Status: AC | PRN
  Administered 2020-04-18: 100 mL via INTRAVENOUS

## 2020-04-18 NOTE — ED Provider Notes (Signed)
Anthony Dunlap is a 87 y.o. male, presenting to the ED with constipation.  HPI from Silverio Decamp, PA-C: "Anthony Dunlap is a 87 y.o. male who presents with concern for constipation with no bowel movement in the last 7 days.  He states that he has undergone 2 enemas and a suppository with a very scant amount of stool afterwards, most recently yesterday, but is unable to pass any stool on his own.  He denies any abdominal pain, nausea, vomiting.  Endorses passing flatus.     He lives at home but has a friend who helps care for him and has in-home nursing staff who come regularly.  He has bilateral below the knee amputation secondary to poorly controlled diabetes and has a chronic Foley catheter due to inability to empty his bladder independently with history of prostate cancer.  I personally reviewed this patient's medical records.  Has history of diabetes, atrial fibrillation on Eliquis and amiodarone, prostate cancer status post surgery, and bilateral below the knee amputations.  Additionally he has hypothyroidism.  He states he has baseline constipation and regularly takes lactulose on Senokot to have bowel movements."    Physical Exam  BP (!) 142/77 (BP Location: Right Arm)   Pulse 74   Temp 98.9 F (37.2 C) (Oral)   Resp 16   Ht 5\' 8"  (1.727 m)   Wt 70.3 kg   SpO2 94%   BMI 23.57 kg/m   Physical Exam Vitals and nursing note reviewed.  Constitutional:      General: He is not in acute distress.    Appearance: He is well-developed and well-nourished. He is not diaphoretic.  HENT:     Head: Normocephalic and atraumatic.  Eyes:     Conjunctiva/sclera: Conjunctivae normal.  Cardiovascular:     Rate and Rhythm: Normal rate and regular rhythm.  Pulmonary:     Effort: Pulmonary effort is normal.  Abdominal:     General: There is no distension.     Palpations: Abdomen is soft.     Tenderness: There is no abdominal tenderness.  Musculoskeletal:     Cervical back: Neck supple.   Skin:    General: Skin is warm and dry.     Coloration: Skin is not pale.  Neurological:     Mental Status: He is alert.  Psychiatric:        Mood and Affect: Mood and affect normal.        Behavior: Behavior normal.     ED Course/Procedures     Procedures   Abnormal Labs Reviewed  BASIC METABOLIC PANEL - Abnormal; Notable for the following components:      Result Value   Sodium 133 (*)    Glucose, Bld 161 (*)    BUN 28 (*)    Calcium 8.5 (*)    All other components within normal limits  CBC WITH DIFFERENTIAL/PLATELET - Abnormal; Notable for the following components:   RBC 3.54 (*)    Hemoglobin 9.0 (*)    HCT 30.1 (*)    MCH 25.4 (*)    MCHC 29.9 (*)    RDW 20.0 (*)    All other components within normal limits  URINALYSIS, ROUTINE W REFLEX MICROSCOPIC - Abnormal; Notable for the following components:   APPearance HAZY (*)    Hgb urine dipstick MODERATE (*)    Protein, ur 100 (*)    Leukocytes,Ua LARGE (*)    WBC, UA >50 (*)    Bacteria, UA RARE (*)  All other components within normal limits  CBG MONITORING, ED - Abnormal; Notable for the following components:   Glucose-Capillary 153 (*)    All other components within normal limits    Hemoglobin  Date Value Ref Range Status  04/18/2020 9.0 (L) 13.0 - 17.0 g/dL Final  12/25/2019 8.4 (L) 13.0 - 17.0 g/dL Final  12/24/2019 8.3 (L) 13.0 - 17.0 g/dL Final  12/23/2019 8.2 (L) 13.0 - 17.0 g/dL Final    CT Abdomen Pelvis W Contrast  Result Date: 04/18/2020 CLINICAL DATA:  Abdominal distension and pain last bowel movement 7 days ago EXAM: CT ABDOMEN AND PELVIS WITH CONTRAST TECHNIQUE: Multidetector CT imaging of the abdomen and pelvis was performed using the standard protocol following bolus administration of intravenous contrast. CONTRAST:  167mL OMNIPAQUE IOHEXOL 300 MG/ML  SOLN COMPARISON:  November 20, 2019 FINDINGS: Lower chest: The visualized heart size within normal limits. No pericardial fluid/thickening. No  hiatal hernia. A small right and trace left pleural effusion is present. There is patchy airspace consolidation seen at both lung bases. Hepatobiliary: The liver is normal in density without focal abnormality.The main portal vein is patent. No evidence of calcified gallstones, gallbladder wall thickening or biliary dilatation. Pancreas: Unremarkable. No pancreatic ductal dilatation or surrounding inflammatory changes. Spleen: Normal in size without focal abnormality. Adrenals/Urinary Tract: Both adrenal glands appear normal. Small subcentimeter scattered low-density lesions are seen within both kidneys, likely simple renal cysts. A Foley catheter seen within a decompressed bladder. Stomach/Bowel: A small hiatal hernia is present. There appears to be mild wall thickening of the proximal gastric fundus and body with question of mild mesenteric stranding changes. There is foodstuff and debris filling the stomach. The remainder of the small bowel is unremarkable. A moderate to large amount of colonic stool is present throughout. Within the sigmoid colon there is a decompressed appearance with question of mild submucosal wall thickening. No surrounding stranding changes however are noted. No pericolonic fluid collections. Vascular/Lymphatic: There are no enlarged mesenteric, retroperitoneal, or pelvic lymph nodes. Scattered aortic atherosclerotic calcifications are seen without aneurysmal dilatation. Reproductive: Radiation prostate seeds are present. Other: Tiny fat containing bilateral inguinal hernias are present. Musculoskeletal: There is diffuse osteopenia and degenerative changes seen in the lower lumbar spine. A right total hip arthroplasty is present. IMPRESSION: 1. Small right and trace left pleural effusion with adjacent likely compressive atelectasis. 2. Findings which may be suggestive of mild gastritis with mild wall thickening of the proximal stomach and gastric fundus. 3. Moderate to large amount of  colonic stool present throughout without definite evidence of obstruction. 4. There appears to be apparent wall thickening of the sigmoid colon which could be due to physiologic underdistention versus mild colitis. No pericolonic fluid collections. 5.  Aortic Atherosclerosis (ICD10-I70.0). Electronically Signed   By: Prudencio Pair M.D.   On: 04/18/2020 20:02    MDM   Clinical Course as of 04/19/20 0040  Mon Apr 18, 2020  2103 Urinalysis, Routine w reflex microscopic Urine, Suprapubic(!) Indwelling foley. [SJ]    Clinical Course User Index [SJ] Lorayne Bender, PA-C     Patient care handoff report received from [facility], PA-C. Plan: Patient receiving soapsuds enema and then can be discharged.  Patient's abdominal exam is benign.  No concerning abnormalities on CT. Patient had been prescribed lactulose regimen, but did not initiate this.  We discussed options and between the patient, his son, and myself decided to give the lactulose regimen a try.  Findings and plan of care discussed  with attending physician, Adrian Prows, MD.     Vitals:   04/18/20 2001 04/18/20 2135 04/18/20 2200 04/18/20 2348  BP: (!) 142/77 115/64 121/63 128/74  Pulse: 74 71 65 72  Resp: 16 17 17 18   Temp: 98.9 F (37.2 C)   98.6 F (37 C)  TempSrc: Oral     SpO2: 94% 96% 93% 100%  Weight:      Height:          Lorayne Bender, PA-C 04/19/20 0041    Carmin Muskrat, MD 04/19/20 0045

## 2020-04-18 NOTE — ED Triage Notes (Signed)
EMS called to pts residence d/t no bowel movement - reports he has not had a bowel movement in 7 days. Reports he has had scant amount of stool come out but that is it  Reports he has had 2 enemas and suppository with no good results - only scant amount  Denies pain in abdomen

## 2020-04-18 NOTE — ED Provider Notes (Signed)
[facility] EMERGENCY DEPARTMENT Provider Note   CSN: [id number] Arrival date & time: 04/18/20  1646     History Chief Complaint  Patient presents with  . Constipation    Anthony Dunlap is a 87 y.o. male who presents with concern for constipation with no bowel movement in the last 7 days.  He states that he has undergone 2 enemas and a suppository with a very scant amount of stool afterwards, most recently yesterday, but is unable to pass any stool on his own.  He denies any abdominal pain, nausea, vomiting.  Endorses passing flatus.     He lives at home but has a friend who helps care for him and has in-home nursing staff who come regularly.  He has bilateral below the knee amputation secondary to poorly controlled diabetes and has a chronic Foley catheter due to inability to empty his bladder independently with history of prostate cancer.  I personally reviewed this patient's medical records.  Has history of diabetes, atrial fibrillation on Eliquis and amiodarone, prostate cancer status post surgery, and bilateral below the knee amputations.  Additionally he has hypothyroidism.  He states he has baseline constipation and regularly takes lactulose on Senokot to have bowel movements.  HPI     Past Medical History:  Diagnosis Date  . Anxiety   . Arthritis   . Atrial fibrillation ([place])   . Chronic heel ulcer, left, limited to breakdown of skin ([place]) 10/28/2019  . 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 ([place]) 04/21/2018  . Diabetes ([place])   . Drug-induced hepatitis 06/05/2018  . Gout   . High cholesterol   . Hypothyroidism   . Inguinal hernia    06/09/2019: per patient " a long time ago on the right side"  . Left eye pain   . Localized osteoarthrosis of right hip 01/05/2019  . Prostate cancer ([place])   . Prostate cancer [facility])     Patient Active Problem List   Diagnosis Date Noted  . Palliative care by specialist   . Goals of care, counseling/discussion   .  Hematuria 12/22/2019  . Acute blood loss anemia 12/22/2019  . Gross hematuria 12/16/2019  . Urinary retention 12/05/2019  . Hx of BKA, left ([place]) 12/05/2019  . PAD (peripheral artery disease) ([place]) 12/05/2019  . Decubitus ulcer of sacral region, stage 2 ([place]) 12/02/2019  . Hyperglycemia due to diabetes mellitus ([place]) 12/02/2019  . Leukocytosis 12/02/2019  . Thrombocytosis 12/02/2019  . Hypoalbuminemia 12/02/2019  . Prolonged QT interval 12/02/2019  . Typical atrial flutter ([place])   . Atrial fibrillation with RVR ([place])   . Acute osteomyelitis of left calcaneus ([name])   . Sepsis ([place]) 11/05/2019  . Chronic heel ulcer, left, limited to breakdown of skin ([place]) 10/28/2019  . Acute osteomyelitis of calcaneum, right ([place]) 09/25/2019  . Critical lower limb ischemia ([place]) 08/11/2019  . Hx of total hip arthroplasty, right 08/06/2019  . Arthritis of right hip 06/12/2019  . Pelvic abscess in male [facility]) 09/11/2018  . Drug-induced hepatitis 06/05/2018  . Calculus of gallbladder without cholecystitis without obstruction   . Fungemia/Candida albicans 05/10/2018  . UTI (urinary tract infection) 05/08/2018  . Pseudomonas urinary tract infection 05/08/2018  . 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 ([place]) 04/21/2018  . Abscess of left thigh   . Pressure ulcer 02/10/2018  . Enterococcus faecalis infection 01/27/2018  . Abscess   . Psoas abscess ([place]) 01/23/2018  . Sepsis due to Bacteroides species ([place]) 01/22/2018  . Sepsis without  acute organ dysfunction ([place])   . DKA (diabetic ketoacidoses) 01/17/2018  . Normocytic anemia 01/17/2018  . Chronic anemia 01/03/2018  . Chronic indwelling Foley catheter 01/03/2018  . Elevated alkaline phosphatase level 01/03/2018  . Hypothyroidism 01/03/2018  . Physical deconditioning 01/03/2018  . 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 HLD (hyperlipidemia) 08/19/2013  . Type 2 diabetes mellitus ([place])   . Prostate cancer [facility])     Past  Surgical History:  Procedure Laterality Date  . ABDOMINAL AORTOGRAM W/LOWER EXTREMITY Bilateral 08/12/2019   Procedure: ABDOMINAL AORTOGRAM W/LOWER EXTREMITY;  Surgeon: Marty Heck, MD;  Location: [place] [facility];  Service: Cardiovascular;  Laterality: Bilateral;  . ABDOMINAL AORTOGRAM W/LOWER EXTREMITY Left 08/19/2019   Procedure: ABDOMINAL AORTOGRAM W/LOWER EXTREMITY;  Surgeon: Marty Heck, MD;  Location: [place] [facility];  Service: Cardiovascular;  Laterality: Left;  . AMPUTATION Right 09/28/2019   Procedure: AMPUTATION BELOW KNEE;  Surgeon: Rosetta Posner, MD;  Location: [place];  Service: Vascular;  Laterality: Right;  . AMPUTATION Left 11/11/2019   Procedure: AMPUTATION BELOW KNEE;  Surgeon: Rosetta Posner, MD;  Location: [facility];  Service: Vascular;  Laterality: Left;  . ESOPHAGOGASTRODUODENOSCOPY (EGD) WITH PROPOFOL N/A 11/09/2019   Procedure: ESOPHAGOGASTRODUODENOSCOPY (EGD) WITH PROPOFOL;  Surgeon: Lavena Bullion, DO;  Location: [place];  Service: Gastroenterology;  Laterality: N/A;  . HERNIA REPAIR    . IR RADIOLOGIST EVAL & MGMT  03/06/2018  . PERIPHERAL VASCULAR BALLOON ANGIOPLASTY Left 08/19/2019   Procedure: PERIPHERAL VASCULAR BALLOON ANGIOPLASTY;  Surgeon: Marty Heck, MD;  Location: [place] [facility];  Service: Cardiovascular;  Laterality: Left;  . PROSTATE SURGERY    . TOTAL HIP ARTHROPLASTY Right 06/12/2019   Procedure: RIGHT TOTAL HIP ARTHROPLASTY DIRECT ANTERIOR;  Surgeon: Marybelle Killings, MD;  Location: [place];  Service: Orthopedics;  Laterality: Right;     Family History  Problem Relation Age of Onset  . Pneumonia Father   . Cancer Sister     Social History   Tobacco Use  . Smoking status: Never Smoker  . Smokeless tobacco: Never Used  Vaping Use  . Vaping Use: Never used  Substance Use Topics  . Alcohol use: No  . Drug use: No    Home Medications Prior to Admission medications   Medication Sig Start Date End Date Taking?  Authorizing Provider  acetaminophen (TYLENOL) 500 MG tablet Take 1,000 mg by mouth every 6 (six) hours as needed for mild pain or moderate pain.   Yes [provider]  ALPRAZolam (XANAX) 0.5 MG tablet Take 1 tablet (0.5 mg total) by mouth 2 (two) times daily as needed for anxiety. Patient taking differently: Take 0.5 mg by mouth at bedtime. 09/30/19  Yes Geradine Girt, DO  amiodarone (PACERONE) 200 MG tablet Take 200 mg by mouth 2 (two) times daily.    Yes [provider]  amitriptyline (ELAVIL) 10 MG tablet Take 20 mg by mouth at bedtime.  11/28/18  Yes [provider]  apixaban (ELIQUIS) 5 MG TABS tablet Take 1 tablet (5 mg total) by mouth 2 (two) times daily. 12/28/19  Yes Hosie Poisson, MD  Cholecalciferol (VITAMIN D-3) 125 MCG (5000 UT) TABS Take 1 tablet by mouth daily.   Yes [provider]  colchicine 0.6 MG tablet Take 0.6 mg by mouth daily as needed (gout attacks).  07/27/19  Yes [provider]  insulin aspart (NOVOLOG) 100 UNIT/ML injection Inject 0-15 Units into the skin 3 (three)  times daily with meals. Sliding scale insulin Less than 70 initiate hypoglycemia protocol 70-120  0 units 120-150 2 unit 151-200 3 units 201-250 3 units 251-300 5 units 301-350 8 units 351-400 11 units  Greater than 400 15 units , call MD Patient taking differently: Inject 0-15 Units into the skin daily as needed. Sliding scale insulin Less than 70 initiate hypoglycemia protocol 70-120  0 units 120-150 2 unit 151-200 3 units 201-250 3 units 251-300 5 units 301-350 8 units 351-400 11 units  Greater than 400 15 units , call MD 02/18/18  Yes Darrick Meigs, Marge Duncans, MD  lactulose (CHRONULAC) 10 GM/15ML solution Take 30 g by mouth 2 (two) times daily as needed. 04/18/20  Yes [provider]  levothyroxine (SYNTHROID) 100 MCG tablet Take 1 tablet (100 mcg total) by mouth daily with breakfast. 12/07/19  Yes Dwyane Dee, MD  metoprolol succinate (TOPROL-XL) 100  MG 24 hr tablet Take 1 tablet (100 mg total) by mouth daily. Take with or immediately following a meal. 11/27/19  Yes Bunnie Pion Z, DO  nystatin (MYCOSTATIN/NYSTOP) powder Apply 1 application topically daily as needed (yeast). 12/06/19  Yes Dwyane Dee, MD  pantoprazole (PROTONIX) 40 MG tablet Take 1 tablet (40 mg total) by mouth daily. 09/30/19  Yes Vann, Jessica U, DO  pravastatin (PRAVACHOL) 10 MG tablet Take 10 mg by mouth daily with supper.    Yes [provider]  primidone (MYSOLINE) 50 MG tablet Take by mouth 3 (three) times daily.  12/29/19  Yes [provider]  [place] 100-33 UNT-MCG/ML SOPN Inject 30-35 Units into the skin daily. 12/16/19  Yes [provider]  sulfamethoxazole-trimethoprim (BACTRIM DS) 800-160 MG tablet Take 1 tablet by mouth 2 (two) times daily. 04/15/20  Yes [provider]  tobramycin (TOBREX) 0.3 % ophthalmic solution Place 1 drop into both eyes in the morning and at bedtime. 12/06/19  Yes Dwyane Dee, MD  Vibegron (GEMTESA) 75 MG TABS Take 1 tablet by mouth daily. 03/16/20  Yes McKenzie, Candee Furbish, MD  zinc gluconate 50 MG tablet Take 50 mg by mouth daily.   Yes [provider]  clopidogrel (PLAVIX) 75 MG tablet Take 1 tablet (75 mg total) by mouth daily. Patient not taking: No sig reported 12/28/19 12/27/20  Hosie Poisson, MD  ferrous sulfate 325 (65 FE) MG tablet Take 1 tablet (325 mg total) by mouth 2 (two) times daily with a meal. Patient taking differently: Take 325 mg by mouth in the morning and at bedtime. 09/30/19   Geradine Girt, DO  nystatin cream (MYCOSTATIN) Apply 1 application topically 2 (two) times daily. Patient not taking: No sig reported 12/29/19   [provider]  senna-docusate (SENOKOT-S) 8.6-50 MG tablet Take 1 tablet by mouth 2 (two) times daily. Patient not taking: Reported on 01/07/2020 09/30/19   Geradine Girt, DO  tamsulosin (FLOMAX) 0.4 MG CAPS capsule Take 0.4 mg by mouth  daily. Patient not taking: Reported on 01/07/2020 12/15/19   [provider]   Allergies    Fluconazole and Ciprofloxacin  Review of Systems   Review of Systems  Constitutional: Negative.  Negative for activity change and chills.  HENT: Negative.   Eyes: Negative.   Respiratory: Negative.   Cardiovascular: Negative.   Gastrointestinal: Positive for constipation. Negative for abdominal pain, anal bleeding, blood in stool, diarrhea, nausea, rectal pain and vomiting.  Genitourinary: Negative for decreased urine volume, dysuria, frequency, hematuria and urgency.       Foley catheter in place  Musculoskeletal: Negative.   Skin: Negative.   Neurological: Negative.   Hematological: Bruises/bleeds easily.       On eliquis  Psychiatric/Behavioral: Negative.     Physical Exam Updated Vital Signs BP (!) 142/77 (BP Location: Right Arm)   Pulse 74   Temp 98.9 F (37.2 C) (Oral)   Resp 16   Ht 5\' 8"  (1.727 m)   Wt 70.3 kg   SpO2 94%   BMI 23.57 kg/m   Physical Exam Vitals and nursing note reviewed. Exam conducted with a chaperone present.  Constitutional:      Appearance: He is not toxic-appearing.  HENT:     Head: Normocephalic and atraumatic.      Nose: Nose normal.     Mouth/Throat:     Mouth: Mucous membranes are moist.     Pharynx: Oropharynx is clear. Uvula midline. No oropharyngeal exudate or posterior oropharyngeal erythema.     Tonsils: No tonsillar exudate.  Eyes:     General: Lids are normal. Vision grossly intact.        Right eye: No discharge.        Left eye: No discharge.     Extraocular Movements: Extraocular movements intact.     Conjunctiva/sclera: Conjunctivae normal.     Pupils: Pupils are equal, round, and reactive to light.  Neck:     Trachea: Trachea and phonation normal.  Cardiovascular:     Rate and Rhythm: Normal rate and regular rhythm.     Pulses: Normal pulses.          Radial pulses are 2+ on the right side and 2+ on the left  side.       Right dorsalis pedis pulse not accessible and left dorsalis pedis pulse not accessible.       Right posterior tibial pulse not accessible and left posterior tibial pulse not accessible.     Heart sounds: Normal heart sounds. No murmur heard.   Pulmonary:     Effort: Pulmonary effort is normal. No bradypnea, accessory muscle usage, prolonged expiration or respiratory distress.     Breath sounds: Normal breath sounds. No wheezing or rales.  Chest:     Chest wall: No deformity, swelling, tenderness or crepitus.  Abdominal:     General: Bowel sounds are normal. There is distension.     Palpations: Abdomen is rigid. There is no hepatomegaly, splenomegaly, mass or pulsatile mass.     Tenderness: There is no abdominal tenderness. There is no right CVA tenderness, left CVA tenderness, guarding or rebound.  Genitourinary:    Prostate: Normal.     Rectum: External hemorrhoid present. Normal anal tone.     Comments: Normal anal tone.  No stool palpable in the distal rectal vault at time of rectal exam.  There is a large amount of mucus that was retrieved from the rectal vault, however there was no disimpaction this patient's large stool burden. Musculoskeletal:        General: No deformity.     Cervical back: Neck supple. No rigidity or crepitus. No pain with movement, spinous process tenderness or muscular tenderness.     Right lower leg: No edema.     Left lower leg: No edema.  Lymphadenopathy:     Cervical: No cervical adenopathy.  Skin:    General: Skin is warm and dry.     Capillary Refill: Capillary refill takes less than 2 seconds.  Neurological:     General: No focal deficit present.  Mental Status: He is alert and oriented to person, place, and time. Mental status is at baseline.     Cranial Nerves: Cranial nerves are intact.     Sensory: Sensation is intact.     Motor: Motor function is intact.  Psychiatric:        Mood and Affect: Mood normal.     ED Results /  Procedures / Treatments   Labs (all labs ordered are listed, but only abnormal results are displayed) Labs Reviewed  BASIC METABOLIC PANEL - Abnormal; Notable for the following components:      Result Value   Sodium 133 (*)    Glucose, Bld 161 (*)    BUN 28 (*)    Calcium 8.5 (*)    All other components within normal limits  CBC WITH DIFFERENTIAL/PLATELET - Abnormal; Notable for the following components:   RBC 3.54 (*)    Hemoglobin 9.0 (*)    HCT 30.1 (*)    MCH 25.4 (*)    MCHC 29.9 (*)    RDW 20.0 (*)    All other components within normal limits  URINALYSIS, ROUTINE W REFLEX MICROSCOPIC - Abnormal; Notable for the following components:   APPearance HAZY (*)    Hgb urine dipstick MODERATE (*)    Protein, ur 100 (*)    Leukocytes,Ua LARGE (*)    WBC, UA >50 (*)    Bacteria, UA RARE (*)    All other components within normal limits  CBG MONITORING, ED - Abnormal; Notable for the following components:   Glucose-Capillary 153 (*)    All other components within normal limits    EKG None  Radiology CT Abdomen Pelvis W Contrast  Result Date: 04/18/2020 CLINICAL DATA:  Abdominal distension and pain last bowel movement 7 days ago EXAM: CT ABDOMEN AND PELVIS WITH CONTRAST TECHNIQUE: Multidetector CT imaging of the abdomen and pelvis was performed using the standard protocol following bolus administration of intravenous contrast. CONTRAST:  150mL OMNIPAQUE IOHEXOL 300 MG/ML  SOLN COMPARISON:  November 20, 2019 FINDINGS: Lower chest: The visualized heart size within normal limits. No pericardial fluid/thickening. No hiatal hernia. A small right and trace left pleural effusion is present. There is patchy airspace consolidation seen at both lung bases. Hepatobiliary: The liver is normal in density without focal abnormality.The main portal vein is patent. No evidence of calcified gallstones, gallbladder wall thickening or biliary dilatation. Pancreas: Unremarkable. No pancreatic ductal  dilatation or surrounding inflammatory changes. Spleen: Normal in size without focal abnormality. Adrenals/Urinary Tract: Both adrenal glands appear normal. Small subcentimeter scattered low-density lesions are seen within both kidneys, likely simple renal cysts. A Foley catheter seen within a decompressed bladder. Stomach/Bowel: A small hiatal hernia is present. There appears to be mild wall thickening of the proximal gastric fundus and body with question of mild mesenteric stranding changes. There is foodstuff and debris filling the stomach. The remainder of the small bowel is unremarkable. A moderate to large amount of colonic stool is present throughout. Within the sigmoid colon there is a decompressed appearance with question of mild submucosal wall thickening. No surrounding stranding changes however are noted. No pericolonic fluid collections. Vascular/Lymphatic: There are no enlarged mesenteric, retroperitoneal, or pelvic lymph nodes. Scattered aortic atherosclerotic calcifications are seen without aneurysmal dilatation. Reproductive: Radiation prostate seeds are present. Other: Tiny fat containing bilateral inguinal hernias are present. Musculoskeletal: There is diffuse osteopenia and degenerative changes seen in the lower lumbar spine. A right total hip arthroplasty is present. IMPRESSION: 1. Small right  and trace left pleural effusion with adjacent likely compressive atelectasis. 2. Findings which may be suggestive of mild gastritis with mild wall thickening of the proximal stomach and gastric fundus. 3. Moderate to large amount of colonic stool present throughout without definite evidence of obstruction. 4. There appears to be apparent wall thickening of the sigmoid colon which could be due to physiologic underdistention versus mild colitis. No pericolonic fluid collections. 5.  Aortic Atherosclerosis (ICD10-I70.0). Electronically Signed   By: Prudencio Pair M.D.   On: 04/18/2020 20:02     Procedures Procedures   Medications Ordered in ED Medications  iohexol (OMNIPAQUE) 300 MG/ML solution 100 mL (100 mLs Intravenous Contrast Given 04/18/20 1852)    ED Course  I have reviewed the triage vital signs and the nursing notes.  Pertinent labs & imaging results that were available during my care of the patient were reviewed by me and considered in my medical decision making (see chart for details).  Clinical Course as of 04/18/20 2132  Mon Apr 18, 2020  2103 Urinalysis, Routine w reflex microscopic Urine, Suprapubic(!) Indwelling foley. [SJ]    Clinical Course User Index [SJ] Joy, Shawn C, PA-C   MDM Rules/Calculators/A&P                         87 year old male presents with concern for abdominal distention and constipation x7 days, refractory to multiple enemas and suppositories at home.  Chronic constipation at baseline, takes lactulose and Senokot as needed.  Differential diagnosis for this patient include to bowel obstruction, ascites, constipation/fecal impaction, neoplasm, urinary retention, diverticulitis.  Vital signs are normal on intake.  Cardiopulmonary exam is normal, abdominal exam revealed distended abdomen with tympany to the left side on percussion. Foley catheter in place, draining clear yellow urine.  Will proceed with basic laboratory studies and CT of the abdomen and pelvis.  CBC with anemia, hemoglobin of 9 at patient's baseline.  BMP with mild bump in BUN to 28, but normal kidney function.  UA at patient baseline with indwelling Foley catheter, not suggestive of infection at this time.  CT scan with bilateral trace pleural effusions, large amount of colonic stool burden without evidence of obstruction. GU exam revealed mucus in the rectal vault, without palpable fecal impaction.  Will order soapsuds enema.  Care of this patient signed out to oncoming ED provider, Arlean Hopping, PA-C at time of shift change.  All pertinent HPI, physical exam, and  laboratory and imaging findings were reviewed with him prior to my departure.  If enema unsuccessful, and vital signs remain safe for discharge home, will recommend patient utilize mag citrate at home and follow-up closely with his primary care doctor.  This chart was dictated using voice recognition software, Dragon. Despite the best efforts of this provider to proofread and correct errors, errors may still occur which can change documentation meaning.  Final Clinical Impression(s) / ED Diagnoses Final diagnoses:  None    Rx / DC Orders ED Discharge Orders    None       Aura Dials 04/18/20 2133    Carmin Muskrat, MD 04/19/20 0021

## 2020-04-18 NOTE — ED Notes (Signed)
Patient transported to CT 

## 2020-04-18 NOTE — ED Notes (Signed)
Soap suds enema done with Gregary Signs, NT. Pt is on side with half of the enema still left. The first 522ml had no effect.

## 2020-04-18 NOTE — Discharge Instructions (Addendum)
You were evaluated for constipation in the ER today.  Your physical exam, vital signs, blood work, and imaging studies were very reassuring in the emergency department today.  Be sure to follow the instructions for the lactulose that was previously prescribed.  Follow-up with the primary care provider for any further management of this issue.  Return to the emergency department develop any new abdominal pain, nausea, vomiting that does not stop, if you stop passing gas, or if you need to be unable to pass bowel movements for several days, or if you develop any other new severe symptoms.

## 2020-04-27 ENCOUNTER — Other Ambulatory Visit: Payer: Self-pay | Admitting: Infectious Disease

## 2020-04-28 ENCOUNTER — Telehealth: Payer: Self-pay

## 2020-04-28 NOTE — Telephone Encounter (Signed)
-----   Message from [facility] sent at 04/28/2020  9:00 AM EST ----- Patient has changed his office visit to a e-visit on 05/04/20, he said just incase [name] needs labs to send the orders to [facility]. Best contact number is [phone]

## 2020-04-28 NOTE — Telephone Encounter (Signed)
I spoke with Judson Roch a nurse with [facility] and verbal orders given to her that the patient will need CBCw/diff,ESR, CRP and bmp will need to be drawn on the patient per Mitzi Hansen verbalized understanding and they will get out to the patient to draw labs on 3/14 or 3/15 Anthony Dunlap

## 2020-04-28 NOTE — Telephone Encounter (Signed)
Can we check a cbc w diff, esr, crp and bmp 

## 2020-05-04 ENCOUNTER — Other Ambulatory Visit: Payer: Self-pay

## 2020-05-04 ENCOUNTER — Telehealth (INDEPENDENT_AMBULATORY_CARE_PROVIDER_SITE_OTHER): Payer: Medicare Other | Admitting: Infectious Disease

## 2020-05-04 DIAGNOSIS — M86172 Other acute osteomyelitis, left ankle and foot: Secondary | ICD-10-CM

## 2020-05-04 DIAGNOSIS — M86171 Other acute osteomyelitis, right ankle and foot: Secondary | ICD-10-CM

## 2020-05-04 DIAGNOSIS — K6812 Psoas muscle abscess: Secondary | ICD-10-CM

## 2020-05-04 DIAGNOSIS — M86659 Other chronic osteomyelitis, unspecified thigh: Secondary | ICD-10-CM

## 2020-05-04 DIAGNOSIS — K716 Toxic liver disease with hepatitis, not elsewhere classified: Secondary | ICD-10-CM

## 2020-05-04 DIAGNOSIS — B49 Unspecified mycosis: Secondary | ICD-10-CM

## 2020-05-04 NOTE — Progress Notes (Signed)
Virtual Visit via Telephone Note  I connected with Anthony Dunlap on 05/04/20 at  3:15 PM EDT by telephone and verified that I am speaking with the correct person using two identifiers.  Location: Patient: Home Provider: [facility]   I discussed the limitations, risks, security and privacy concerns of performing an evaluation and management service by telephone and the availability of in person appointments. I also discussed with the patient that there may be a patient responsible charge related to this service. The patient expressed understanding and agreed to proceed.   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  His course was complicated by intertrigo with candidemia, complicated by Azole induced hepatitis  He is also had bad peripheral vascular disease with wounds.   He had some worsening of wound and I was called on a weekend and added doxycyclineprior to his most recent admission.  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  He also has ulcer on opposite heel which progressed.  He apparently was having fevers at home and the home health nurse thought he was pale and did not look well and he was ultimately brought to [facility] via EMS. In the ER he was found to be in atrial fibrillation with a rapid ventricular response into the 140s. A CT the abdomen pelvis was done which showed what was thought to be an exudative pericardial effusion.  It also showed his known pubic symphysis osteomyelitis but without any new findings on imaging and that area.  He did have  pyuria on examination of his nation of his urine. Urine cultures and blood cultures were taken. Blood cultures have been sterile x2 days and the urine culture was not helpful as it grew multiple specimens.  He was  on broad-spectrum antibiotics in the form of vancomycin cefepime and metronidazole.  Blood cultures were negative. MRI of opposite foot showed calcaneal osteomyelitis and he underwent BKA on the left now.  Post BKA antibiotics were stopped. He has been off antibacterial therapy since September.  He was subsequently admitted with occlusion of catheter which was exchanged and rx with echinocandin for funguria x 3 days.  His hip pain dramatically improved sp THA.  I saw him in November 2021 in person and we elected to continue to observe him off antibiotics which we have done  He could not come to clinic today so we obtained labs via [facility]  ESR went down to 70 and CRP to 15  He had no pelvic pain in his amputation sites were healing up well.  We did a video visit in January and he was doing well at that point in time as well.  Today was a telephone visit.  We got labs through [facility] and sed rate had come down into the 30s and CRP was stable.  He has no pain in his pelvis whatsoever.  Hip pain is resolved ever since he had his hip replaced.  He says his amputation sites have both healed up well.   Past Medical History:  Diagnosis Date  . Anxiety   . Arthritis   . Atrial fibrillation ([name])   . Chronic heel ulcer,  left, limited to breakdown of skin ([place]) 10/28/2019  . 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 ([place]) 04/21/2018  . Diabetes ([place])   . Drug-induced hepatitis 06/05/2018  . Gout   . High cholesterol   . Hypothyroidism   . Inguinal hernia    06/09/2019: per patient " a long time ago on the right side"  . Left eye pain   . Localized osteoarthrosis of right hip 01/05/2019  . Prostate cancer ([place])   . Prostate cancer [facility])     Past  Surgical History:  Procedure Laterality Date  . ABDOMINAL AORTOGRAM W/LOWER EXTREMITY Bilateral 08/12/2019   Procedure: ABDOMINAL AORTOGRAM W/LOWER EXTREMITY;  Surgeon: Marty Heck, MD;  Location: [place] [facility];  Service: Cardiovascular;  Laterality: Bilateral;  . ABDOMINAL AORTOGRAM W/LOWER EXTREMITY Left 08/19/2019   Procedure: ABDOMINAL AORTOGRAM W/LOWER EXTREMITY;  Surgeon: Marty Heck, MD;  Location: [place] [facility];  Service: Cardiovascular;  Laterality: Left;  . AMPUTATION Right 09/28/2019   Procedure: AMPUTATION BELOW KNEE;  Surgeon: Rosetta Posner, MD;  Location: [place];  Service: Vascular;  Laterality: Right;  . AMPUTATION Left 11/11/2019   Procedure: AMPUTATION BELOW KNEE;  Surgeon: Rosetta Posner, MD;  Location: [facility];  Service: Vascular;  Laterality: Left;  . ESOPHAGOGASTRODUODENOSCOPY (EGD) WITH PROPOFOL N/A 11/09/2019   Procedure: ESOPHAGOGASTRODUODENOSCOPY (EGD) WITH PROPOFOL;  Surgeon: Lavena Bullion, DO;  Location: [place];  Service: Gastroenterology;  Laterality: N/A;  . HERNIA REPAIR    . IR RADIOLOGIST EVAL & MGMT  03/06/2018  . PERIPHERAL VASCULAR BALLOON ANGIOPLASTY Left 08/19/2019   Procedure: PERIPHERAL VASCULAR BALLOON ANGIOPLASTY;  Surgeon: Marty Heck, MD;  Location: [place] [facility];  Service: Cardiovascular;  Laterality: Left;  . PROSTATE SURGERY    . TOTAL HIP ARTHROPLASTY Right 06/12/2019   Procedure: RIGHT TOTAL HIP ARTHROPLASTY DIRECT ANTERIOR;  Surgeon: Marybelle Killings, MD;  Location: [place];  Service: Orthopedics;  Laterality: Right;    Family History  Problem Relation Age of Onset  . Pneumonia Father   . Cancer Sister       Social History   Socioeconomic History  . Marital status: Widowed    Spouse name: [name]  . Number of children: 3  . Years of education: 9th  . Highest education level: Not on file  Occupational History    Employer: RETIRED    Comment: Retired  Tobacco Use  . Smoking status: Never Smoker   . Smokeless tobacco: Never Used  Vaping Use  . Vaping Use: Never used  Substance and Sexual Activity  . Alcohol use: No  . Drug use: No  . Sexual activity: Not on file  Other Topics Concern  . Not on file  Social History Narrative   Patient lives at home with his wife. [name]) . Patient is retired.   Education 9th grade.   Right handed.   Caffeine None   Social Determinants of Health   Financial Resource Strain: Not on file  Food Insecurity: Not on file  Transportation Needs: Not on file  Physical Activity: Not on file  Stress: Not on file  Social Connections: Not on file    Allergies  Allergen Reactions  . Fluconazole Other (See Comments)    Drug-induced hepatitis  . Ciprofloxacin Nausea Only     Current Outpatient Medications:  .  acetaminophen (TYLENOL) 500 MG tablet, Take 1,000 mg by mouth every 6 (six) hours as needed for mild pain or moderate pain., Disp: , Rfl:  .  ALPRAZolam (XANAX) 0.5 MG tablet, Take 1 tablet (0.5 mg total) by mouth 2 (two) times daily as needed for anxiety. (Patient taking differently: Take 0.5 mg by mouth at bedtime.), Disp: 4 tablet, Rfl: 0 .  amiodarone (PACERONE) 200 MG tablet, Take 200 mg by mouth 2 (two) times daily. , Disp: , Rfl:  .  amitriptyline (ELAVIL) 10 MG tablet, Take 20 mg by mouth at bedtime. , Disp: , Rfl:  .  apixaban (ELIQUIS) 5 MG TABS tablet, Take 1 tablet (5 mg total) by mouth 2 (two) times daily., Disp: 60 tablet, Rfl: 0 .  Cholecalciferol (VITAMIN D-3) 125 MCG (5000 UT) TABS, Take 1 tablet by mouth daily., Disp: , Rfl:  .  clopidogrel (PLAVIX) 75 MG tablet, Take 1 tablet (75 mg total) by mouth daily. (Patient not taking: No sig reported), Disp: 30 tablet, Rfl: 0 .  colchicine 0.6 MG tablet, Take 0.6 mg by mouth daily as needed (gout attacks). , Disp: , Rfl:  .  ferrous sulfate 325 (65 FE) MG tablet, Take 1 tablet (325 mg total) by mouth 2 (two) times daily with a meal. (Patient taking differently: Take 325 mg by mouth  in the morning and at bedtime.), Disp: , Rfl: 3 .  insulin aspart (NOVOLOG) 100 UNIT/ML injection, Inject 0-15 Units into the skin 3 (three) times daily with meals. Sliding scale insulin Less than 70 initiate hypoglycemia protocol 70-120  0 units 120-150 2 unit 151-200 3 units 201-250 3 units 251-300 5 units 301-350 8 units 351-400 11 units  Greater than 400 15 units , call MD (Patient taking differently: Inject 0-15 Units into the skin daily as needed. Sliding scale insulin Less than 70 initiate hypoglycemia protocol 70-120  0 units 120-150 2 unit 151-200 3 units 201-250 3 units 251-300 5 units 301-350 8 units 351-400 11 units  Greater than 400 15 units , call MD), Disp: 10 mL, Rfl: 11 .  lactulose (CHRONULAC) 10 GM/15ML solution, Take 30 g by mouth 2 (two) times daily as needed., Disp: , Rfl:  .  levothyroxine (SYNTHROID) 100 MCG tablet, Take 1 tablet (100 mcg total) by mouth daily with breakfast., Disp: 30 tablet, Rfl: 3 .  metoprolol succinate (TOPROL-XL) 100 MG 24 hr tablet, Take 1 tablet (100 mg total) by mouth daily. Take with or immediately following a meal., Disp: 30 tablet, Rfl: 0 .  nystatin (MYCOSTATIN/NYSTOP) powder, Apply 1 application topically daily as needed (yeast)., Disp: , Rfl:  .  nystatin cream (MYCOSTATIN), Apply 1 application topically 2 (two) times daily. (Patient not taking: No sig reported), Disp: , Rfl:  .  pantoprazole (PROTONIX) 40 MG tablet, Take 1 tablet (40 mg total) by mouth daily., Disp: , Rfl:  .  pravastatin (PRAVACHOL) 10 MG tablet, Take 10 mg by mouth daily with supper. , Disp: , Rfl:  .  primidone (MYSOLINE) 50 MG tablet, Take by mouth 3 (three) times daily. , Disp: , Rfl:  .  senna-docusate (SENOKOT-S) 8.6-50 MG tablet, Take 1 tablet by mouth 2 (two) times daily. (Patient not taking: Reported on 01/07/2020), Disp: , Rfl:  .  SOLIQUA 100-33 UNT-MCG/ML SOPN, Inject 30-35 Units into the skin daily., Disp: , Rfl:  .  sulfamethoxazole-trimethoprim (BACTRIM DS) 800-160  MG tablet, Take 1 tablet by mouth 2 (two) times daily., Disp: , Rfl:  .  tamsulosin (FLOMAX) 0.4 MG CAPS capsule, Take 0.4 mg by mouth daily. (Patient not taking: Reported on 01/07/2020), Disp: , Rfl:  .  tobramycin (TOBREX) 0.3 % ophthalmic  solution, Place 1 drop into both eyes in the morning and at bedtime., Disp: , Rfl:  .  Vibegron (GEMTESA) 75 MG TABS, Take 1 tablet by mouth daily., Disp: 30 tablet, Rfl: 11 .  zinc gluconate 50 MG tablet, Take 50 mg by mouth daily., Disp: , Rfl:     Observations/Objective:  Achillies appeared to be doing well over the telephone.  Assessment and Plan:  Pelvis osteomyelitis: Continues to appear to be quiesced sent will observe off antibiotics and schedule him back with me in 6 months  Candida infection and candidemia: less likely to be an issue off abx. Remember NO AZOLES  Azole induced hepatitis: do not give azoles for any fungal infection to him  Bilateral calcaneal osteomyelitis: sp BKA bilaterally and healing well  Follow Up Instructions:    I discussed the assessment and treatment plan with the patient. The patient was provided an opportunity to ask questions and all were answered. The patient agreed with the plan and demonstrated an understanding of the instructions.   The patient was advised to call back or seek an in-person evaluation if the symptoms worsen or if the condition fails to improve as anticipated.  I provided 13 minutes of non-face-to-face time during this encounter.   Alcide Evener, MD

## 2020-06-10 ENCOUNTER — Emergency Department (HOSPITAL_COMMUNITY)
Admission: EM | Admit: 2020-06-10 | Discharge: 2020-06-10 | Disposition: A | Discharge status: Home / Self Care | Payer: Medicare Other | Attending: Emergency Medicine | Admitting: Emergency Medicine

## 2020-06-10 ENCOUNTER — Other Ambulatory Visit: Payer: Self-pay

## 2020-06-10 ENCOUNTER — Encounter (HOSPITAL_COMMUNITY): Payer: Self-pay | Admitting: *Deleted

## 2020-06-10 DIAGNOSIS — Z794 Long term (current) use of insulin: Secondary | ICD-10-CM | POA: Diagnosis not present

## 2020-06-10 DIAGNOSIS — Z89512 Acquired absence of left leg below knee: Secondary | ICD-10-CM | POA: Insufficient documentation

## 2020-06-10 DIAGNOSIS — Z79899 Other long term (current) drug therapy: Secondary | ICD-10-CM | POA: Diagnosis not present

## 2020-06-10 DIAGNOSIS — Z96641 Presence of right artificial hip joint: Secondary | ICD-10-CM | POA: Diagnosis not present

## 2020-06-10 DIAGNOSIS — R410 Disorientation, unspecified: Secondary | ICD-10-CM | POA: Insufficient documentation

## 2020-06-10 DIAGNOSIS — Z8546 Personal history of malignant neoplasm of prostate: Secondary | ICD-10-CM | POA: Diagnosis not present

## 2020-06-10 DIAGNOSIS — Z89511 Acquired absence of right leg below knee: Secondary | ICD-10-CM | POA: Diagnosis not present

## 2020-06-10 DIAGNOSIS — E1110 Type 2 diabetes mellitus with ketoacidosis without coma: Secondary | ICD-10-CM | POA: Insufficient documentation

## 2020-06-10 DIAGNOSIS — E039 Hypothyroidism, unspecified: Secondary | ICD-10-CM | POA: Diagnosis not present

## 2020-06-10 DIAGNOSIS — Z7901 Long term (current) use of anticoagulants: Secondary | ICD-10-CM | POA: Insufficient documentation

## 2020-06-10 LAB — BASIC METABOLIC PANEL
Anion gap: 10 (ref 5–15)
BUN: 26 mg/dL — ABNORMAL HIGH (ref 8–23)
CO2: 25 mmol/L (ref 22–32)
Calcium: 8.7 mg/dL — ABNORMAL LOW (ref 8.9–10.3)
Chloride: 99 mmol/L (ref 98–111)
Creatinine, Ser: 1.04 mg/dL (ref 0.61–1.24)
GFR, Estimated: >60 mL/min (ref >60)
Glucose, Bld: 123 mg/dL — ABNORMAL HIGH (ref 70–99)
Potassium: 4.2 mmol/L (ref 3.5–5.1)
Sodium: 134 mmol/L — ABNORMAL LOW (ref 135–145)

## 2020-06-10 LAB — CBC WITH DIFFERENTIAL/PLATELET
Abs Immature Granulocytes: 0.02 10*3/uL (ref 0.00–0.07)
Basophils Absolute: 0 10*3/uL (ref 0.0–0.1)
Basophils Relative: 0 %
Eosinophils Absolute: 0.5 10*3/uL (ref 0.0–0.5)
Eosinophils Relative: 5 %
HCT: 34.8 % — ABNORMAL LOW (ref 39.0–52.0)
Hemoglobin: 10.8 g/dL — ABNORMAL LOW (ref 13.0–17.0)
Immature Granulocytes: 0 %
Lymphocytes Relative: 24 %
Lymphs Abs: 2.2 10*3/uL (ref 0.7–4.0)
MCH: 27.1 pg (ref 26.0–34.0)
MCHC: 31 g/dL (ref 30.0–36.0)
MCV: 87.2 fL (ref 80.0–100.0)
Monocytes Absolute: 0.8 10*3/uL (ref 0.1–1.0)
Monocytes Relative: 8 %
Neutro Abs: 5.6 10*3/uL (ref 1.7–7.7)
Neutrophils Relative %: 63 %
Platelets: 303 10*3/uL (ref 150–400)
RBC: 3.99 MIL/uL — ABNORMAL LOW (ref 4.22–5.81)
RDW: 15.9 % — ABNORMAL HIGH (ref 11.5–15.5)
WBC: 9.1 10*3/uL (ref 4.0–10.5)
nRBC: 0 % (ref 0.0–0.2)

## 2020-06-10 LAB — LACTIC ACID, PLASMA
Lactic Acid, Venous: 1.2 mmol/L (ref 0.5–1.9)
Lactic Acid, Venous: 1.2 mmol/L (ref 0.5–1.9)

## 2020-06-10 LAB — URINALYSIS, ROUTINE W REFLEX MICROSCOPIC
Bilirubin Urine: NEGATIVE
Glucose, UA: NEGATIVE mg/dL
Ketones, ur: 5 mg/dL — AB
Nitrite: NEGATIVE
Protein, ur: 100 mg/dL — AB
Specific Gravity, Urine: 1.018 (ref 1.005–1.030)
WBC, UA: >50 WBC/hpf — ABNORMAL HIGH (ref 0–5)
pH: 5 (ref 5.0–8.0)

## 2020-06-10 LAB — AMMONIA: Ammonia: 11 umol/L (ref 9–35)

## 2020-06-10 MED ORDER — SULFAMETHOXAZOLE-TRIMETHOPRIM 800-160 MG PO TABS: 1.0000 | ORAL_TABLET | Freq: Two times a day (BID) | ORAL | 0 refills | Status: AC

## 2020-06-10 MED ORDER — SODIUM CHLORIDE 0.9 % IV BOLUS
500.0000 mL | Freq: Once | INTRAVENOUS | Status: AC
  Administered 2020-06-10: 500 mL via INTRAVENOUS

## 2020-06-10 NOTE — ED Triage Notes (Addendum)
Pt brought in by [facility] from home with c/o confusion that started this morning per family. Family called Home Health nurse who was concerned pt may be septic from a UTI.  Pt has been treated for a UTI for about a month now and has been taking his antibiotics. EMS reports pt has been fully alert and oriented for them.

## 2020-06-10 NOTE — ED Provider Notes (Addendum)
[facility] Provider Note   CSN: [id number] Arrival date & time: 06/10/20  1456     History Chief Complaint  Patient presents with  . Altered Mental Status    Anthony Dunlap is a 87 y.o. male.  Patient brought into the ER for evaluation for confusion.  Per family he was not "acting himself."  Symptoms have reportedly started this morning.  Patient himself states he is not sure what his family is concerned about.  He states that he has no pain or discomfort.  Denies any headache or chest pain or abdominal pain.  No fever no cough no vomiting no diarrhea.  Patient has an indwelling Foley catheter in place.  He denies flank pain or back pain or fevers.        Past Medical History:  Diagnosis Date  . Anxiety   . Arthritis   . Atrial fibrillation ([place])   . Chronic heel ulcer, left, limited to breakdown of skin ([place]) 10/28/2019  . 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 ([place]) 04/21/2018  . Diabetes ([place])   . Drug-induced hepatitis 06/05/2018  . Gout   . High cholesterol   . Hypothyroidism   . Inguinal hernia    06/09/2019: per patient " a long time ago on the right side"  . Left eye pain   . Localized osteoarthrosis of right hip 01/05/2019  . Prostate cancer ([place])   . Prostate cancer [facility])     Patient Active Problem List   Diagnosis Date Noted  . Palliative care by specialist   . Goals of care, counseling/discussion   . Hematuria 12/22/2019  . Acute blood loss anemia 12/22/2019  . Gross hematuria 12/16/2019  . Urinary retention 12/05/2019  . Hx of BKA, left ([place]) 12/05/2019  . PAD (peripheral artery disease) ([place]) 12/05/2019  . Decubitus ulcer of sacral region, stage 2 ([place]) 12/02/2019  . Hyperglycemia due to diabetes mellitus ([place]) 12/02/2019  . Leukocytosis 12/02/2019  . Thrombocytosis 12/02/2019  . Hypoalbuminemia 12/02/2019  . Prolonged QT interval 12/02/2019  . Typical atrial flutter ([place])   . Atrial fibrillation with RVR ([place])    . Acute osteomyelitis of left calcaneus ([name])   . Sepsis ([place]) 11/05/2019  . Chronic heel ulcer, left, limited to breakdown of skin ([place]) 10/28/2019  . Acute osteomyelitis of calcaneum, right ([place]) 09/25/2019  . Critical lower limb ischemia ([place]) 08/11/2019  . Hx of total hip arthroplasty, right 08/06/2019  . Arthritis of right hip 06/12/2019  . Pelvic abscess in male [facility]) 09/11/2018  . Drug-induced hepatitis 06/05/2018  . Calculus of gallbladder without cholecystitis without obstruction   . Fungemia/Candida albicans 05/10/2018  . UTI (urinary tract infection) 05/08/2018  . Pseudomonas urinary tract infection 05/08/2018  . 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 ([place]) 04/21/2018  . Abscess of left thigh   . Pressure ulcer 02/10/2018  . Enterococcus faecalis infection 01/27/2018  . Abscess   . Psoas abscess ([place]) 01/23/2018  . Sepsis due to Bacteroides species ([place]) 01/22/2018  . Sepsis without acute organ dysfunction ([place])   . DKA (diabetic ketoacidoses) 01/17/2018  . Normocytic anemia 01/17/2018  . Chronic anemia 01/03/2018  . Chronic indwelling Foley catheter 01/03/2018  . Elevated alkaline phosphatase level 01/03/2018  . Hypothyroidism 01/03/2018  . Physical deconditioning 01/03/2018  . 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 HLD (hyperlipidemia) 08/19/2013  . Type 2 diabetes mellitus ([place])   . Prostate cancer [facility])     Past Surgical History:  Procedure Laterality Date  . ABDOMINAL AORTOGRAM W/LOWER EXTREMITY Bilateral 08/12/2019   Procedure: ABDOMINAL AORTOGRAM W/LOWER EXTREMITY;  Surgeon: Marty Heck, MD;  Location: [facility];  Service: Cardiovascular;  Laterality: Bilateral;  . ABDOMINAL AORTOGRAM W/LOWER EXTREMITY Left 08/19/2019   Procedure: ABDOMINAL AORTOGRAM W/LOWER EXTREMITY;  Surgeon: Marty Heck, MD;  Location: [place] [facility];  Service: Cardiovascular;  Laterality: Left;  . AMPUTATION Right 09/28/2019    Procedure: AMPUTATION BELOW KNEE;  Surgeon: Rosetta Posner, MD;  Location: [facility];  Service: Vascular;  Laterality: Right;  . AMPUTATION Left 11/11/2019   Procedure: AMPUTATION BELOW KNEE;  Surgeon: Rosetta Posner, MD;  [facility];  Service: Vascular;  Laterality: Left;  . ESOPHAGOGASTRODUODENOSCOPY (EGD) WITH PROPOFOL N/A 11/09/2019   Procedure: ESOPHAGOGASTRODUODENOSCOPY (EGD) WITH PROPOFOL;  Surgeon: Lavena Bullion, DO;  Location: [facility];  Service: Gastroenterology;  Laterality: N/A;  . HERNIA REPAIR    . IR RADIOLOGIST EVAL & MGMT  03/06/2018  . PERIPHERAL VASCULAR BALLOON ANGIOPLASTY Left 08/19/2019   Procedure: PERIPHERAL VASCULAR BALLOON ANGIOPLASTY;  Surgeon: Marty Heck, MD;  Location: [facility];  Service: Cardiovascular;  Laterality: Left;  . PROSTATE SURGERY    . TOTAL HIP ARTHROPLASTY Right 06/12/2019   Procedure: RIGHT TOTAL HIP ARTHROPLASTY DIRECT ANTERIOR;  Surgeon: Marybelle Killings, MD;  Location: [facility];  Service: Orthopedics;  Laterality: Right;       Family History  Problem Relation Age of Onset  . Pneumonia Father   . Cancer Sister     Social History   Tobacco Use  . Smoking status: Never Smoker  . Smokeless tobacco: Never Used  Vaping Use  . Vaping Use: Never used  Substance Use Topics  . Alcohol use: No  . Drug use: No    Home Medications Prior to Admission medications   Medication Sig Start Date End Date Taking? Authorizing Provider  sulfamethoxazole-trimethoprim (BACTRIM DS) 800-160 MG tablet Take 1 tablet by mouth 2 (two) times daily for 5 days. 06/10/20 06/15/20 Yes Luna Fuse, MD  acetaminophen (TYLENOL) 500 MG tablet Take 1,000 mg by mouth every 6 (six) hours as needed for mild pain or moderate pain.    [provider]  ALPRAZolam Duanne Moron) 0.5 MG tablet Take 1 tablet (0.5 mg total) by mouth 2 (two) times daily as needed for anxiety. Patient taking differently: Take 0.5 mg by mouth at bedtime. 09/30/19   Geradine Girt,  DO  amiodarone (PACERONE) 200 MG tablet Take 200 mg by mouth 2 (two) times daily.     [provider]  amitriptyline (ELAVIL) 10 MG tablet Take 20 mg by mouth at bedtime.  11/28/18   [provider]  apixaban (ELIQUIS) 5 MG TABS tablet Take 1 tablet (5 mg total) by mouth 2 (two) times daily. 12/28/19   Hosie Poisson, MD  Cholecalciferol (VITAMIN D-3) 125 MCG (5000 UT) TABS Take 1 tablet by mouth daily.    [provider]  clopidogrel (PLAVIX) 75 MG tablet Take 1 tablet (75 mg total) by mouth daily. Patient not taking: No sig reported 12/28/19 12/27/20  Hosie Poisson, MD  colchicine 0.6 MG tablet Take 0.6 mg by mouth daily as needed (gout attacks).  07/27/19   [provider]  ferrous sulfate 325 (65 FE) MG tablet Take 1 tablet (325 mg total) by mouth 2 (two) times daily with a meal. Patient taking differently: Take 325 mg by mouth in the morning and at bedtime. 09/30/19  Eulogio Bear U, DO  insulin aspart (NOVOLOG) 100 UNIT/ML injection Inject 0-15 Units into the skin 3 (three) times daily with meals. Sliding scale insulin Less than 70 initiate hypoglycemia protocol 70-120  0 units 120-150 2 unit 151-200 3 units 201-250 3 units 251-300 5 units 301-350 8 units 351-400 11 units  Greater than 400 15 units , call MD Patient taking differently: Inject 0-15 Units into the skin daily as needed. Sliding scale insulin Less than 70 initiate hypoglycemia protocol 70-120  0 units 120-150 2 unit 151-200 3 units 201-250 3 units 251-300 5 units 301-350 8 units 351-400 11 units  Greater than 400 15 units , call MD 02/18/18   Oswald Hillock, MD  lactulose (CHRONULAC) 10 GM/15ML solution Take 30 g by mouth 2 (two) times daily as needed. 04/18/20   [provider]  levothyroxine (SYNTHROID) 100 MCG tablet Take 1 tablet (100 mcg total) by mouth daily with breakfast. 12/07/19   Dwyane Dee, MD  metoprolol succinate (TOPROL-XL) 100 MG 24 hr tablet Take 1 tablet (100  mg total) by mouth daily. Take with or immediately following a meal. 11/27/19   Bunnie Pion Z, DO  nystatin (MYCOSTATIN/NYSTOP) powder Apply 1 application topically daily as needed (yeast). 12/06/19   Dwyane Dee, MD  nystatin cream (MYCOSTATIN) Apply 1 application topically 2 (two) times daily. Patient not taking: No sig reported 12/29/19   [provider]  pantoprazole (PROTONIX) 40 MG tablet Take 1 tablet (40 mg total) by mouth daily. 09/30/19   Geradine Girt, DO  pravastatin (PRAVACHOL) 10 MG tablet Take 10 mg by mouth daily with supper.     [provider]  primidone (MYSOLINE) 50 MG tablet Take by mouth 3 (three) times daily.  12/29/19   [provider]  senna-docusate (SENOKOT-S) 8.6-50 MG tablet Take 1 tablet by mouth 2 (two) times daily. Patient not taking: Reported on 01/07/2020 09/30/19   Geradine Girt, DO  SOLIQUA 100-33 UNT-MCG/ML SOPN Inject 30-35 Units into the skin daily. 12/16/19   [provider]  tamsulosin (FLOMAX) 0.4 MG CAPS capsule Take 0.4 mg by mouth daily. Patient not taking: Reported on 01/07/2020 12/15/19   [provider]  tobramycin (TOBREX) 0.3 % ophthalmic solution Place 1 drop into both eyes in the morning and at bedtime. 12/06/19   Dwyane Dee, MD  Vibegron (GEMTESA) 75 MG TABS Take 1 tablet by mouth daily. 03/16/20   McKenzie, Candee Furbish, MD  zinc gluconate 50 MG tablet Take 50 mg by mouth daily.    [provider]    Allergies    Fluconazole and Ciprofloxacin  Review of Systems   Review of Systems  Constitutional: Negative for fever.  HENT: Negative for ear pain and sore throat.   Eyes: Negative for pain.  Respiratory: Negative for cough.   Cardiovascular: Negative for chest pain.  Gastrointestinal: Negative for abdominal pain.  Genitourinary: Negative for flank pain.  Musculoskeletal: Negative for back pain.  Skin: Negative for color change and rash.  Neurological: Negative for syncope.   All other systems reviewed and are negative.   Physical Exam Updated Vital Signs BP 132/81   Pulse 73   Temp 98.5 F (36.9 C) (Oral)   Resp 18   Ht 5\' 8"  (1.727 m)   Wt 77.1 kg   SpO2 95%   BMI 25.85 kg/m   Physical Exam Constitutional:      General: He is not in acute distress.    Appearance: He is well-developed.  HENT:     Head: Normocephalic.     Nose: Nose normal.  Eyes:     Extraocular Movements: Extraocular movements intact.  Cardiovascular:     Rate and Rhythm: Normal rate.  Pulmonary:     Effort: Pulmonary effort is normal.  Musculoskeletal:     Comments: Bilateral lower extremity BKA's  Skin:    Coloration: Skin is not jaundiced.  Neurological:     General: No focal deficit present.     Mental Status: He is alert and oriented to person, place, and time. Mental status is at baseline.     Cranial Nerves: No cranial nerve deficit.     Motor: No weakness.     Comments: Patient is awake and alert and appropriate.  He is oriented to time, place, person.  He can tell me the correct year, who the president is and is able to describe to me what he ate for breakfast.     ED Results / Procedures / Treatments   Labs (all labs ordered are listed, but only abnormal results are displayed) Labs Reviewed  CBC WITH DIFFERENTIAL/PLATELET - Abnormal; Notable for the following components:      Result Value   RBC 3.99 (*)    Hemoglobin 10.8 (*)    HCT 34.8 (*)    RDW 15.9 (*)    All other components within normal limits  BASIC METABOLIC PANEL - Abnormal; Notable for the following components:   Sodium 134 (*)    Glucose, Bld 123 (*)    BUN 26 (*)    Calcium 8.7 (*)    All other components within normal limits  URINALYSIS, ROUTINE W REFLEX MICROSCOPIC - Abnormal; Notable for the following components:   APPearance TURBID (*)    Hgb urine dipstick MODERATE (*)    Ketones, ur 5 (*)    Protein, ur 100 (*)    Leukocytes,Ua MODERATE (*)    WBC, UA >50 (*)    Bacteria,  UA FEW (*)    All other components within normal limits  AMMONIA  LACTIC ACID, PLASMA  LACTIC ACID, PLASMA    EKG None  Radiology No results found.  Procedures Procedures   Medications Ordered in ED Medications  sodium chloride 0.9 % bolus 500 mL (0 mLs Intravenous Stopped 06/10/20 1600)    ED Course  I have reviewed the triage vital signs and the nursing notes.  Pertinent labs & imaging results that were available during my care of the patient were reviewed by me and considered in my medical decision making (see chart for details).    MDM Rules/Calculators/A&P                          Clinically the patient appears in no acute distress.  He is afebrile with normal vital signs.  She is alert and awake and oriented.  Basic labs were sent.  Given his indwelling Foley catheter I did not send the sample as he clinically had no symptoms of urinary tract infection.  Case discussed with patient's family members.  We discussed colonization versus active infection.  Recommending immediate return if he has fevers worsening symptoms or any additional concerns.  Throughout his ER stay patient remains awake alert oriented and appropriate upon my interactions.  Will recommend following up with his doctor in 2 or 3 days as well.   Final Clinical Impression(s) / ED Diagnoses Final diagnoses:  Confusion    Rx / DC Orders  ED Discharge Orders         Ordered    sulfamethoxazole-trimethoprim (BACTRIM DS) 800-160 MG tablet  2 times daily        06/10/20 1759           Luna Fuse, MD 06/10/20 1759    Luna Fuse, MD 06/10/20 1800

## 2020-06-10 NOTE — Discharge Instructions (Signed)
Call your primary care doctor or specialist as discussed in the next 2-3 days.   Return immediately back to the ER if:  Your symptoms worsen within the next 12-24 hours. You develop new symptoms such as new fevers, persistent vomiting, new pain, shortness of breath, or new weakness or numbness, or if you have any other concerns.  

## 2020-06-28 ENCOUNTER — Telehealth: Payer: Self-pay

## 2020-06-28 MED ORDER — TROSPIUM CHLORIDE ER 60 MG PO CP24: 60.0000 mg | ORAL_CAPSULE | Freq: Every day | ORAL | 11 refills | Status: AC

## 2020-06-28 NOTE — Telephone Encounter (Signed)
Rx sent in. Home health nurse Inez Catalina called and made aware.

## 2020-06-28 NOTE — Telephone Encounter (Signed)
Anthony Dunlap from [facility] called and made aware of new rx sent in and to stop Gemtesa when new medication starts.

## 2020-07-03 ENCOUNTER — Emergency Department (HOSPITAL_COMMUNITY)
Admission: EM | Admit: 2020-07-03 | Discharge: 2020-07-03 | Disposition: A | Discharge status: Home / Self Care | Payer: Medicare Other | Attending: Emergency Medicine | Admitting: Emergency Medicine

## 2020-07-03 ENCOUNTER — Encounter (HOSPITAL_COMMUNITY): Payer: Self-pay

## 2020-07-03 ENCOUNTER — Emergency Department (HOSPITAL_COMMUNITY): Payer: Medicare Other

## 2020-07-03 ENCOUNTER — Other Ambulatory Visit: Payer: Self-pay

## 2020-07-03 DIAGNOSIS — Z20822 Contact with and (suspected) exposure to covid-19: Secondary | ICD-10-CM | POA: Diagnosis not present

## 2020-07-03 DIAGNOSIS — Z79899 Other long term (current) drug therapy: Secondary | ICD-10-CM | POA: Insufficient documentation

## 2020-07-03 DIAGNOSIS — E1110 Type 2 diabetes mellitus with ketoacidosis without coma: Secondary | ICD-10-CM | POA: Insufficient documentation

## 2020-07-03 DIAGNOSIS — Z794 Long term (current) use of insulin: Secondary | ICD-10-CM | POA: Insufficient documentation

## 2020-07-03 DIAGNOSIS — E860 Dehydration: Secondary | ICD-10-CM

## 2020-07-03 DIAGNOSIS — Z8546 Personal history of malignant neoplasm of prostate: Secondary | ICD-10-CM | POA: Diagnosis not present

## 2020-07-03 DIAGNOSIS — N3001 Acute cystitis with hematuria: Secondary | ICD-10-CM | POA: Insufficient documentation

## 2020-07-03 DIAGNOSIS — E039 Hypothyroidism, unspecified: Secondary | ICD-10-CM | POA: Diagnosis not present

## 2020-07-03 DIAGNOSIS — Z96641 Presence of right artificial hip joint: Secondary | ICD-10-CM | POA: Insufficient documentation

## 2020-07-03 DIAGNOSIS — Z7901 Long term (current) use of anticoagulants: Secondary | ICD-10-CM | POA: Diagnosis not present

## 2020-07-03 DIAGNOSIS — R531 Weakness: Secondary | ICD-10-CM | POA: Diagnosis present

## 2020-07-03 LAB — CBC WITH DIFFERENTIAL/PLATELET
Abs Immature Granulocytes: 0.05 10*3/uL (ref 0.00–0.07)
Basophils Absolute: 0.1 10*3/uL (ref 0.0–0.1)
Basophils Relative: 0 %
Eosinophils Absolute: 0.1 10*3/uL (ref 0.0–0.5)
Eosinophils Relative: 1 %
HCT: 38.8 % — ABNORMAL LOW (ref 39.0–52.0)
Hemoglobin: 12.2 g/dL — ABNORMAL LOW (ref 13.0–17.0)
Immature Granulocytes: 0 %
Lymphocytes Relative: 10 %
Lymphs Abs: 1.3 10*3/uL (ref 0.7–4.0)
MCH: 27.7 pg (ref 26.0–34.0)
MCHC: 31.4 g/dL (ref 30.0–36.0)
MCV: 88.2 fL (ref 80.0–100.0)
Monocytes Absolute: 0.7 10*3/uL (ref 0.1–1.0)
Monocytes Relative: 5 %
Neutro Abs: 11.3 10*3/uL — ABNORMAL HIGH (ref 1.7–7.7)
Neutrophils Relative %: 84 %
Platelets: 381 10*3/uL (ref 150–400)
RBC: 4.4 MIL/uL (ref 4.22–5.81)
RDW: 15 % (ref 11.5–15.5)
WBC: 13.4 10*3/uL — ABNORMAL HIGH (ref 4.0–10.5)
nRBC: 0 % (ref 0.0–0.2)

## 2020-07-03 LAB — COMPREHENSIVE METABOLIC PANEL
ALT: 25 U/L (ref 0–44)
AST: 28 U/L (ref 15–41)
Albumin: 3.8 g/dL (ref 3.5–5.0)
Alkaline Phosphatase: 132 U/L — ABNORMAL HIGH (ref 38–126)
Anion gap: 11 (ref 5–15)
BUN: 28 mg/dL — ABNORMAL HIGH (ref 8–23)
CO2: 22 mmol/L (ref 22–32)
Calcium: 9.2 mg/dL (ref 8.9–10.3)
Chloride: 100 mmol/L (ref 98–111)
Creatinine, Ser: 1.08 mg/dL (ref 0.61–1.24)
GFR, Estimated: >60 mL/min (ref >60)
Glucose, Bld: 236 mg/dL — ABNORMAL HIGH (ref 70–99)
Potassium: 4.9 mmol/L (ref 3.5–5.1)
Sodium: 133 mmol/L — ABNORMAL LOW (ref 135–145)
Total Bilirubin: 0.5 mg/dL (ref 0.3–1.2)
Total Protein: 8.3 g/dL — ABNORMAL HIGH (ref 6.5–8.1)

## 2020-07-03 LAB — CBG MONITORING, ED: Glucose-Capillary: 233 mg/dL — ABNORMAL HIGH (ref 70–99)

## 2020-07-03 LAB — URINALYSIS, MICROSCOPIC (REFLEX): WBC, UA: >50 WBC/hpf (ref 0–5)

## 2020-07-03 LAB — URINALYSIS, ROUTINE W REFLEX MICROSCOPIC
Bilirubin Urine: NEGATIVE
Glucose, UA: NEGATIVE mg/dL
Ketones, ur: NEGATIVE mg/dL
Nitrite: POSITIVE — AB
Specific Gravity, Urine: >1.03 — ABNORMAL HIGH (ref 1.005–1.030)
WBC, UA: >50 WBC/hpf — ABNORMAL HIGH (ref 0–5)
pH: 5.5 (ref 5.0–8.0)

## 2020-07-03 LAB — RESP PANEL BY RT-PCR (FLU A&B, COVID) ARPGX2
Influenza A by PCR: NEGATIVE
Influenza B by PCR: NEGATIVE
SARS Coronavirus 2 by RT PCR: NEGATIVE

## 2020-07-03 MED ORDER — SODIUM CHLORIDE 0.9 % IV BOLUS
1000.0000 mL | Freq: Once | INTRAVENOUS | Status: AC
  Administered 2020-07-03: 1000 mL via INTRAVENOUS

## 2020-07-03 MED ORDER — SODIUM CHLORIDE 0.9 % IV BOLUS
500.0000 mL | Freq: Once | INTRAVENOUS | Status: AC
  Administered 2020-07-03: 500 mL via INTRAVENOUS

## 2020-07-03 MED ORDER — CEPHALEXIN 500 MG PO CAPS: 500.0000 mg | ORAL_CAPSULE | Freq: Four times a day (QID) | ORAL | 0 refills | Status: AC

## 2020-07-03 MED ORDER — SODIUM CHLORIDE 0.9 % IV SOLN
1.0000 g | Freq: Once | INTRAVENOUS | Status: AC
  Administered 2020-07-03: 1 g via INTRAVENOUS
  Filled 2020-07-03: qty 10

## 2020-07-03 NOTE — ED Notes (Signed)
CT states they disposed of pt's glucose meter including the needle in the sharps container per their policy. PT was given a phone number for a free replacement per [facility] in [place]. CN made aware.

## 2020-07-03 NOTE — ED Triage Notes (Signed)
Pt presents to ED via [facility] from home. Family called out for transport for sick call. Pt c/o nausea since yesterday. Family reported cough and congestion. Pt states he coughed "a little bit." CBG 254

## 2020-07-03 NOTE — ED Provider Notes (Addendum)
[facility] EMERGENCY DEPARTMENT Provider Note   CSN: [id number] Arrival date & time: 07/03/20  1024     History Chief Complaint  Patient presents with  . Nausea    Anthony Dunlap is a 87 y.o. male.  Patient brought into the emergency department for general weakness.  The history is provided by the patient and a relative. No language interpreter was used.  Weakness Severity:  Moderate Onset quality:  Sudden Timing:  Constant Progression:  Waxing and waning Chronicity:  Recurrent Context: not alcohol use   Associated symptoms: no abdominal pain, no chest pain, no cough, no diarrhea, no frequency, no headaches and no seizures        Past Medical History:  Diagnosis Date  . Anxiety   . Arthritis   . Atrial fibrillation ([place])   . Chronic heel ulcer, left, limited to breakdown of skin ([place]) 10/28/2019  . 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 ([place]) 04/21/2018  . Diabetes ([place])   . Drug-induced hepatitis 06/05/2018  . Gout   . High cholesterol   . Hypothyroidism   . Inguinal hernia    06/09/2019: per patient " a long time ago on the right side"  . Left eye pain   . Localized osteoarthrosis of right hip 01/05/2019  . Prostate cancer ([place])   . Prostate cancer [facility])     Patient Active Problem List   Diagnosis Date Noted  . Palliative care by specialist   . Goals of care, counseling/discussion   . Hematuria 12/22/2019  . Acute blood loss anemia 12/22/2019  . Gross hematuria 12/16/2019  . Urinary retention 12/05/2019  . Hx of BKA, left ([place]) 12/05/2019  . PAD (peripheral artery disease) ([place]) 12/05/2019  . Decubitus ulcer of sacral region, stage 2 ([place]) 12/02/2019  . Hyperglycemia due to diabetes mellitus ([place]) 12/02/2019  . Leukocytosis 12/02/2019  . Thrombocytosis 12/02/2019  . Hypoalbuminemia 12/02/2019  . Prolonged QT interval 12/02/2019  . Typical atrial flutter ([place])   . Atrial fibrillation with RVR ([place])   . Acute osteomyelitis of left  calcaneus ([name])   . Sepsis ([place]) 11/05/2019  . Chronic heel ulcer, left, limited to breakdown of skin ([place]) 10/28/2019  . Acute osteomyelitis of calcaneum, right ([place]) 09/25/2019  . Critical lower limb ischemia ([place]) 08/11/2019  . Hx of total hip arthroplasty, right 08/06/2019  . Arthritis of right hip 06/12/2019  . Pelvic abscess in male [facility]) 09/11/2018  . Drug-induced hepatitis 06/05/2018  . Calculus of gallbladder without cholecystitis without obstruction   . Fungemia/Candida albicans 05/10/2018  . UTI (urinary tract infection) 05/08/2018  . Pseudomonas urinary tract infection 05/08/2018  . 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 ([place]) 04/21/2018  . Abscess of left thigh   . Pressure ulcer 02/10/2018  . Enterococcus faecalis infection 01/27/2018  . Abscess   . Psoas abscess ([place]) 01/23/2018  . Sepsis due to Bacteroides species ([place]) 01/22/2018  . Sepsis without acute organ dysfunction ([place])   . DKA (diabetic ketoacidoses) 01/17/2018  . Normocytic anemia 01/17/2018  . Chronic anemia 01/03/2018  . Chronic indwelling Foley catheter 01/03/2018  . Elevated alkaline phosphatase level 01/03/2018  . Hypothyroidism 01/03/2018  . Physical deconditioning 01/03/2018  . 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 HLD (hyperlipidemia) 08/19/2013  . Type 2 diabetes mellitus ([place])   . Prostate cancer [facility])     Past Surgical History:  Procedure Laterality Date  . ABDOMINAL AORTOGRAM W/LOWER EXTREMITY Bilateral 08/12/2019   Procedure: ABDOMINAL AORTOGRAM W/LOWER EXTREMITY;  Surgeon: Marty Heck, MD;  Location: [place] [facility];  Service: Cardiovascular;  Laterality: Bilateral;  . ABDOMINAL AORTOGRAM W/LOWER EXTREMITY Left 08/19/2019   Procedure: ABDOMINAL AORTOGRAM W/LOWER EXTREMITY;  Surgeon: Marty Heck, MD;  Location: [place] [facility];  Service: Cardiovascular;  Laterality: Left;  . AMPUTATION Right 09/28/2019   Procedure: AMPUTATION BELOW KNEE;   Surgeon: Rosetta Posner, MD;  Location: [place];  Service: Vascular;  Laterality: Right;  . AMPUTATION Left 11/11/2019   Procedure: AMPUTATION BELOW KNEE;  Surgeon: Rosetta Posner, MD;  Location: [facility];  Service: Vascular;  Laterality: Left;  . ESOPHAGOGASTRODUODENOSCOPY (EGD) WITH PROPOFOL N/A 11/09/2019   Procedure: ESOPHAGOGASTRODUODENOSCOPY (EGD) WITH PROPOFOL;  Surgeon: Lavena Bullion, DO;  Location: [place];  Service: Gastroenterology;  Laterality: N/A;  . HERNIA REPAIR    . IR RADIOLOGIST EVAL & MGMT  03/06/2018  . PERIPHERAL VASCULAR BALLOON ANGIOPLASTY Left 08/19/2019   Procedure: PERIPHERAL VASCULAR BALLOON ANGIOPLASTY;  Surgeon: Marty Heck, MD;  Location: [place] [facility];  Service: Cardiovascular;  Laterality: Left;  . PROSTATE SURGERY    . TOTAL HIP ARTHROPLASTY Right 06/12/2019   Procedure: RIGHT TOTAL HIP ARTHROPLASTY DIRECT ANTERIOR;  Surgeon: Marybelle Killings, MD;  Location: [place];  Service: Orthopedics;  Laterality: Right;       Family History  Problem Relation Age of Onset  . Pneumonia Father   . Cancer Sister     Social History   Tobacco Use  . Smoking status: Never Smoker  . Smokeless tobacco: Never Used  Vaping Use  . Vaping Use: Never used  Substance Use Topics  . Alcohol use: No  . Drug use: No    Home Medications Prior to Admission medications   Medication Sig Start Date End Date Taking? Authorizing Provider  cephALEXin (KEFLEX) 500 MG capsule Take 1 capsule (500 mg total) by mouth 4 (four) times daily. 07/03/20  Yes Milton Ferguson, MD  acetaminophen (TYLENOL) 500 MG tablet Take 1,000 mg by mouth every 6 (six) hours as needed for mild pain or moderate pain.    [provider]  ALPRAZolam Duanne Moron) 0.5 MG tablet Take 1 tablet (0.5 mg total) by mouth 2 (two) times daily as needed for anxiety. Patient taking differently: Take 0.5 mg by mouth at bedtime. 09/30/19   Geradine Girt, DO  amiodarone (PACERONE) 200 MG tablet Take 200 mg by mouth  2 (two) times daily.     [provider]  amitriptyline (ELAVIL) 10 MG tablet Take 20 mg by mouth at bedtime.  11/28/18   [provider]  apixaban (ELIQUIS) 5 MG TABS tablet Take 1 tablet (5 mg total) by mouth 2 (two) times daily. 12/28/19   Hosie Poisson, MD  Cholecalciferol (VITAMIN D-3) 125 MCG (5000 UT) TABS Take 1 tablet by mouth daily.    [provider]  ciprofloxacin (CIPRO) 500 MG tablet Take 500 mg by mouth 2 (two) times daily. 06/01/20   [provider]  colchicine 0.6 MG tablet Take 0.6 mg by mouth daily as needed (gout attacks).  07/27/19   [provider]  ferrous sulfate 325 (65 FE) MG tablet Take 1 tablet (325 mg total) by mouth 2 (two) times daily with a meal. Patient taking differently: Take 325 mg by mouth in the morning and at bedtime. 09/30/19   Geradine Girt, DO  insulin aspart (NOVOLOG) 100 UNIT/ML injection Inject 0-15 Units into the skin 3 (three) times daily with meals. Sliding scale insulin  Less than 70 initiate hypoglycemia protocol 70-120  0 units 120-150 2 unit 151-200 3 units 201-250 3 units 251-300 5 units 301-350 8 units 351-400 11 units  Greater than 400 15 units , call MD Patient taking differently: Inject 0-15 Units into the skin daily as needed. Sliding scale insulin Less than 70 initiate hypoglycemia protocol 70-120  0 units 120-150 2 unit 151-200 3 units 201-250 3 units 251-300 5 units 301-350 8 units 351-400 11 units  Greater than 400 15 units , call MD 02/18/18   Oswald Hillock, MD  lactulose (CHRONULAC) 10 GM/15ML solution Take 30 g by mouth 2 (two) times daily as needed. 04/18/20   [provider]  levothyroxine (SYNTHROID) 100 MCG tablet Take 1 tablet (100 mcg total) by mouth daily with breakfast. 12/07/19   Dwyane Dee, MD  metoprolol succinate (TOPROL-XL) 100 MG 24 hr tablet Take 1 tablet (100 mg total) by mouth daily. Take with or immediately following a meal. 11/27/19   Bunnie Pion Z,  DO  nitrofurantoin (MACRODANTIN) 50 MG capsule Take 50 mg by mouth daily. 06/16/20   [provider]  nystatin (MYCOSTATIN/NYSTOP) powder Apply 1 application topically daily as needed (yeast). 12/06/19   Dwyane Dee, MD  nystatin cream (MYCOSTATIN) Apply 1 application topically 2 (two) times daily. Patient not taking: No sig reported 12/29/19   [provider]  pantoprazole (PROTONIX) 40 MG tablet Take 1 tablet (40 mg total) by mouth daily. 09/30/19   Geradine Girt, DO  pravastatin (PRAVACHOL) 10 MG tablet Take 10 mg by mouth daily with supper.     [provider]  primidone (MYSOLINE) 50 MG tablet Take by mouth 3 (three) times daily.  12/29/19   [provider]  SOLIQUA 100-33 UNT-MCG/ML SOPN Inject 30-35 Units into the skin daily. 12/16/19   [provider]  tobramycin (TOBREX) 0.3 % ophthalmic solution Place 1 drop into both eyes in the morning and at bedtime. 12/06/19   Dwyane Dee, MD  Trospium Chloride 60 MG CP24 Take 1 capsule (60 mg total) by mouth daily. 06/28/20   McKenzie, Candee Furbish, MD    Allergies    Fluconazole and Ciprofloxacin  Review of Systems   Review of Systems  Constitutional: Negative for appetite change and fatigue.  HENT: Negative for congestion, ear discharge and sinus pressure.   Eyes: Negative for discharge.  Respiratory: Negative for cough.   Cardiovascular: Negative for chest pain.  Gastrointestinal: Negative for abdominal pain and diarrhea.  Genitourinary: Negative for frequency and hematuria.  Musculoskeletal: Negative for back pain.  Skin: Negative for rash.  Neurological: Positive for weakness. Negative for seizures and headaches.  Psychiatric/Behavioral: Negative for hallucinations.    Physical Exam Updated Vital Signs BP (!) 138/110   Pulse 73   Temp 97.6 F (36.4 C) (Oral)   Resp 20   Ht 5\' 8"  (1.727 m)   Wt 76.1 kg   SpO2 97%   BMI 25.51 kg/m   Physical Exam Vitals and nursing note  reviewed.  Constitutional:      Appearance: He is well-developed.  HENT:     Head: Normocephalic.     Nose: Nose normal.  Eyes:     General: No scleral icterus.    Conjunctiva/sclera: Conjunctivae normal.  Neck:     Thyroid: No thyromegaly.  Cardiovascular:     Rate and Rhythm: Normal rate and regular rhythm.     Heart sounds: No murmur heard. No friction rub. No gallop.   Pulmonary:  Breath sounds: No stridor. No wheezing or rales.  Chest:     Chest wall: No tenderness.  Abdominal:     General: There is no distension.     Tenderness: There is no abdominal tenderness. There is no rebound.  Genitourinary:    Comments: Patient has a Foley in place Musculoskeletal:     Cervical back: Neck supple.     Comments: Patient has bilateral below the knee amputation  Lymphadenopathy:     Cervical: No cervical adenopathy.  Skin:    Findings: No erythema or rash.  Neurological:     Mental Status: He is alert and oriented to person, place, and time.     Motor: No abnormal muscle tone.     Coordination: Coordination normal.  Psychiatric:        Behavior: Behavior normal.     ED Results / Procedures / Treatments   Labs (all labs ordered are listed, but only abnormal results are displayed) Labs Reviewed  CBC WITH DIFFERENTIAL/PLATELET - Abnormal; Notable for the following components:      Result Value   WBC 13.4 (*)    Hemoglobin 12.2 (*)    HCT 38.8 (*)    Neutro Abs 11.3 (*)    All other components within normal limits  COMPREHENSIVE METABOLIC PANEL - Abnormal; Notable for the following components:   Sodium 133 (*)    Glucose, Bld 236 (*)    BUN 28 (*)    Total Protein 8.3 (*)    Alkaline Phosphatase 132 (*)    All other components within normal limits  URINALYSIS, ROUTINE W REFLEX MICROSCOPIC - Abnormal; Notable for the following components:   APPearance CLOUDY (*)    Specific Gravity, Urine >1.030 (*)    Hgb urine dipstick MODERATE (*)    Protein, ur TRACE (*)     Nitrite POSITIVE (*)    Leukocytes,Ua MODERATE (*)    WBC, UA >50 (*)    Bacteria, UA MANY (*)    All other components within normal limits  URINALYSIS, MICROSCOPIC (REFLEX) - Abnormal; Notable for the following components:   Bacteria, UA MANY (*)    All other components within normal limits  CBG MONITORING, ED - Abnormal; Notable for the following components:   Glucose-Capillary 233 (*)    All other components within normal limits  RESP PANEL BY RT-PCR (FLU A&B, COVID) [id number]  URINE CULTURE    EKG None  Radiology CT Head Wo Contrast  Result Date: 07/03/2020 CLINICAL DATA:  Cerebral hemorrhage suspected. Nausea started yesterday. EXAM: CT HEAD WITHOUT CONTRAST TECHNIQUE: Contiguous axial images were obtained from the base of the skull through the vertex without intravenous contrast. COMPARISON:  12/01/2019 FINDINGS: Brain: There is moderate central and cortical atrophy. Periventricular white matter changes are consistent with small vessel disease. There is no intra or extra-axial fluid collection or mass lesion. The basilar cisterns and ventricles have a normal appearance. There is no CT evidence for acute infarction or hemorrhage. Vascular: Minimal atherosclerotic calcification of the internal carotid arteries. No hyperdense vessels. Skull: Normal. Negative for fracture or focal lesion. Sinuses/Orbits: No acute finding. Other: None. IMPRESSION: 1. Atrophy and small vessel disease. 2.  No evidence for acute  abnormality. Electronically Signed   By: Nolon Nations M.D.   On: 07/03/2020 11:53   DG ABD ACUTE 2+V W 1V CHEST  Result Date: 07/03/2020 CLINICAL DATA:  Abdominal distension and cough. EXAM: DG ABDOMEN ACUTE WITH 1 VIEW CHEST COMPARISON:  CT AP from 11/20/2019 FINDINGS:  There is no evidence of dilated bowel loops or free intraperitoneal air. There is gaseous distension of the colon along with a large stool burden compatible with constipation. No dilated small bowel loops noted at  this time. Prostate gland seed implants noted. Right hip arthroplasty. Heart size and mediastinal contours are within normal limits. Low lung volumes with bibasilar atelectasis. IMPRESSION: 1. Large stool burden within the colon compatible with constipation. 2. Low lung volumes and bibasilar atelectasis. Electronically Signed   By: Kerby Moors M.D.   On: 07/03/2020 12:22    Procedures Procedures   Medications Ordered in ED Medications  cefTRIAXone (ROCEPHIN) 1 g in sodium chloride 0.9 % 100 mL IVPB (1 g Intravenous New Bag/Given 07/03/20 1435)  sodium chloride 0.9 % bolus 500 mL (0 mLs Intravenous Stopped 07/03/20 1312)  sodium chloride 0.9 % bolus 1,000 mL (1,000 mLs Intravenous New Bag/Given 07/03/20 1406)    ED Course  I have reviewed the triage vital signs and the nursing notes.  Pertinent labs & imaging results that were available during my care of the patient were reviewed by me and considered in my medical decision making (see chart for details).    MDM Rules/Calculators/A&P                          Labs show patient is dehydrated has urinary tract infection he will be given fluids and antibiotics and cultures done [address] Dr. Thursday Final Clinical Impression(s) / ED Diagnoses Final diagnoses:  Acute cystitis with hematuria  Dehydration    Rx / DC Orders ED Discharge Orders         Ordered    cephALEXin (KEFLEX) 500 MG capsule  4 times daily        07/03/20 1453           Milton Ferguson, MD 07/03/20 1458    Milton Ferguson, MD 07/03/20 1458

## 2020-07-03 NOTE — ED Notes (Signed)
Patient's foley catheter intact on arrival with leg strap attached; bag recently changed yesterday and urine is flowing appropriately.

## 2020-07-03 NOTE — Discharge Instructions (Addendum)
Drink plenty of fluids and follow-up Thursday as planned with your doctor

## 2020-07-03 NOTE — ED Notes (Signed)
Patient transported to CT 

## 2020-07-03 NOTE — ED Notes (Signed)
[employer] called to set up transportation at this time. 

## 2020-07-07 LAB — URINE CULTURE: Culture: >=100000 — AB

## 2020-07-08 ENCOUNTER — Telehealth: Payer: Self-pay | Admitting: Emergency Medicine

## 2020-07-08 NOTE — Progress Notes (Signed)
ED Antimicrobial Stewardship Positive Culture Follow Up   Anthony Dunlap is an 87 y.o. male who presented to [facility] on 07/03/2020 with a chief complaint of nausea/weakness, no reported urinary symptoms. Patient has chronic foley catheter managed by urology. Culture grew multidrug resistant organisms.   Chief Complaint  Patient presents with  . Nausea    Recent Results (from the past 720 hour(s))  Resp Panel by RT-PCR (Flu A&B, Covid) Nasopharyngeal Swab     Status: None   Collection Time: 07/03/20 12:06 PM   Specimen: Nasopharyngeal Swab; Nasopharyngeal(NP) swabs in vial transport medium  Result Value Ref Range Status   SARS Coronavirus 2 by RT PCR NEGATIVE NEGATIVE Final    Comment: (NOTE) SARS-CoV-2 target nucleic acids are NOT DETECTED.  The SARS-CoV-2 RNA is generally detectable in upper respiratory specimens during the acute phase of infection. The lowest concentration of SARS-CoV-2 viral copies this assay can detect is 138 copies/mL. A negative result does not preclude SARS-Cov-2 infection and should not be used as the sole basis for treatment or other patient management decisions. A negative result may occur with  improper specimen collection/handling, submission of specimen other than nasopharyngeal swab, presence of viral mutation(s) within the areas targeted by this assay, and inadequate number of viral copies(<138 copies/mL). A negative result must be combined with clinical observations, patient history, and epidemiological information. The expected result is Negative.  Fact Sheet for Patients:  EntrepreneurPulse.com.au  Fact Sheet for Healthcare Providers:  IncredibleEmployment.be  This test is no t yet approved or cleared by the [place] FDA and  has been authorized for detection and/or diagnosis of SARS-CoV-2 by FDA under an Emergency Use Authorization (EUA). This EUA will remain  in effect (meaning this test can be  used) for the duration of the COVID-19 declaration under Section 564(b)(1) of the Act, 21 U.S.C.section 360bbb-3(b)(1), unless the authorization is terminated  or revoked sooner.       Influenza A by PCR NEGATIVE NEGATIVE Final   Influenza B by PCR NEGATIVE NEGATIVE Final    Comment: (NOTE) The Xpert Xpress SARS-CoV-2/FLU/RSV plus assay is intended as an aid in the diagnosis of influenza from Nasopharyngeal swab specimens and should not be used as a sole basis for treatment. Nasal washings and aspirates are unacceptable for Xpert Xpress SARS-CoV-2/FLU/RSV testing.  Fact Sheet for Patients: EntrepreneurPulse.com.au  Fact Sheet for Healthcare Providers: IncredibleEmployment.be  This test is not yet approved or cleared by the [place] FDA and has been authorized for detection and/or diagnosis of SARS-CoV-2 by FDA under an Emergency Use Authorization (EUA). This EUA will remain in effect (meaning this test can be used) for the duration of the COVID-19 declaration under Section 564(b)(1) of the Act, 21 U.S.C. section 360bbb-3(b)(1), unless the authorization is terminated or revoked.  Performed at [facility], [address] [place], [place] [address]   Urine Culture     Status: Abnormal   Collection Time: 07/03/20 12:29 PM   Specimen: Urine, Clean Catch  Result Value Ref Range Status   Specimen Description   Final    URINE, CLEAN CATCH Performed at [facility], [address], [place], [place] [address]    Special Requests   Final    NONE Performed at [facility], [address], [place],  [address]    Culture (A)  Final    >=100,000 COLONIES/mL CITROBACTER FREUNDII >=100,000 COLONIES/mL PSEUDOMONAS AERUGINOSA    Report Status 07/07/2020 FINAL  Final   Organism ID, Bacteria CITROBACTER FREUNDII (  A)  Final   Organism ID, Bacteria PSEUDOMONAS AERUGINOSA (A)  Final      Susceptibility   Citrobacter freundii - MIC*     CEFAZOLIN >=64 RESISTANT Resistant     CEFEPIME <=0.12 SENSITIVE Sensitive     CEFTRIAXONE <=0.25 SENSITIVE Sensitive     CIPROFLOXACIN 0.5 SENSITIVE Sensitive     GENTAMICIN >=16 RESISTANT Resistant     IMIPENEM <=0.25 SENSITIVE Sensitive     NITROFURANTOIN 64 INTERMEDIATE Intermediate     TRIMETH/SULFA >=320 RESISTANT Resistant     PIP/TAZO <=4 SENSITIVE Sensitive     * >=100,000 COLONIES/mL CITROBACTER FREUNDII   Pseudomonas aeruginosa - MIC*    CEFTAZIDIME 16 INTERMEDIATE Intermediate     CIPROFLOXACIN 2 INTERMEDIATE Intermediate     GENTAMICIN 8 INTERMEDIATE Intermediate     IMIPENEM 1 SENSITIVE Sensitive     PIP/TAZO 16 SENSITIVE Sensitive     * >=100,000 COLONIES/mL PSEUDOMONAS AERUGINOSA    [x]  Treated with cephalexin, organism resistant to prescribed antimicrobial  Plan:  Call patient for symptom check:  -If no symptoms, no treatment indicated -If urinary symptoms, schedule follow up with urology to manage  ED Provider: Franchot Heidelberg, [place] 07/08/2020, 9:27 AM Clinical Pharmacist Monday - Friday phone -  [phone] Saturday - Sunday phone - [phone]

## 2020-07-08 NOTE — Telephone Encounter (Signed)
Post ED Visit - Positive Culture Follow-up: Unsuccessful Patient Follow-up  Culture assessed and recommendations reviewed by:  []  Elenor Quinones, Pharm.D. []  Heide Guile, Pharm.D., BCPS AQ-ID []  Parks Neptune, Pharm.D., BCPS []  Alycia Rossetti, Pharm.D., BCPS []  [place], [place].D., BCPS, AAHIVP []  Legrand Como, Pharm.D., BCPS, AAHIVP [x]  Rebbeca Paul, PharmD []  Vincenza Hews, PharmD, BCPS  Positive urine culture  []  Patient discharged without antimicrobial prescription and treatment is now indicated []  Organism is resistant to prescribed ED discharge antimicrobial []  Patient with positive blood cultures   Unable to contact patient at phone numbers on file, letter will be sent to address on file.  Plan: symptom check If no symptoms, no treatment indicated If urinary symptoms, schedule follow-up with urology to manage  [place] PA   Milus Mallick 07/08/2020, 12:55 PM

## 2020-07-20 DEATH — deceased

## 2020-08-15 IMAGING — US US ABDOMEN LIMITED
1 series · 14 of 25 positions shown · non-contrast
Comparison: CT scan of January 26, 2018.

CLINICAL DATA: Abnormal liver function tests.

EXAM:
ULTRASOUND ABDOMEN LIMITED RIGHT UPPER QUADRANT

[Series 1: us abdomen limited · 0.19mm/px · 14 of 37 slices shown]
[im 1/37]
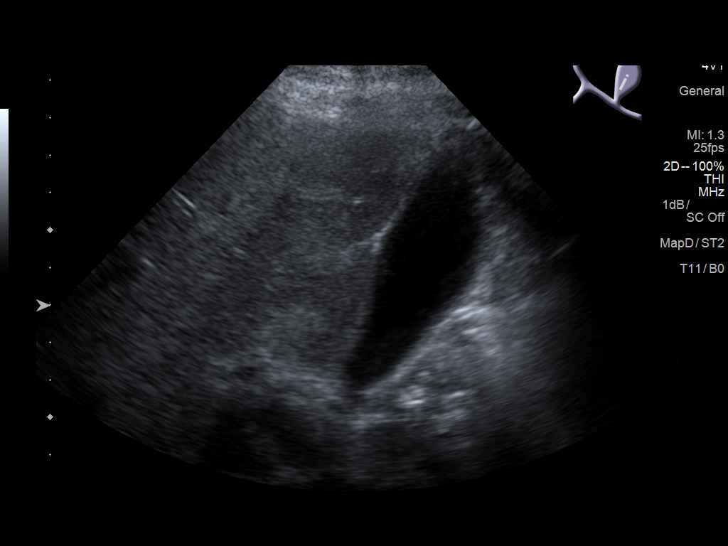
[im 4/37]
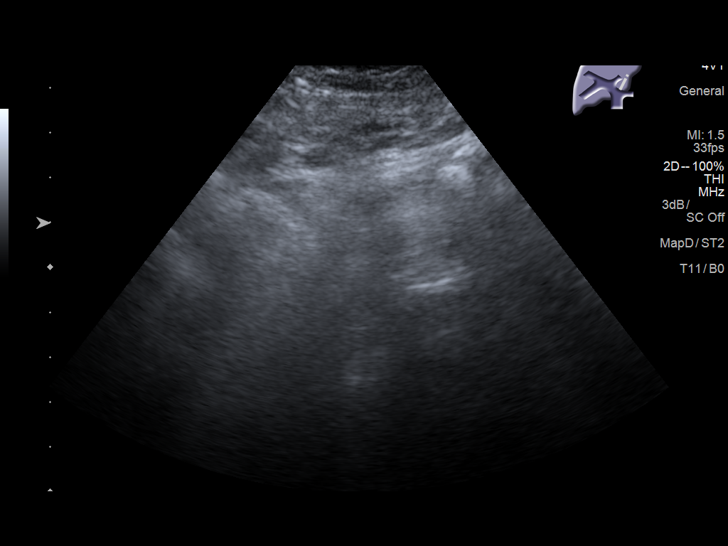
[im 7/37]
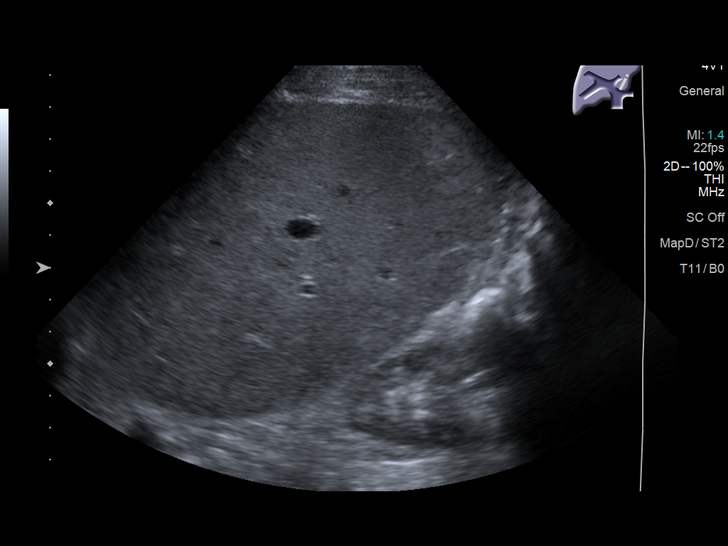
[im 10/37]
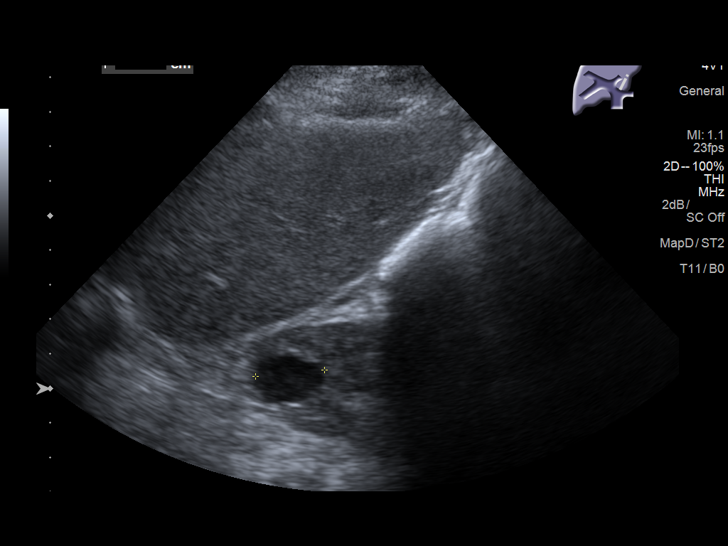
[im 13/37]
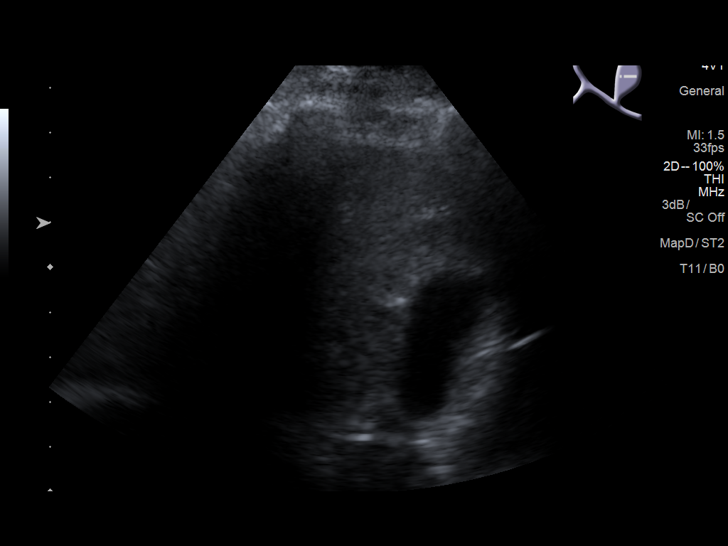
[im 14/37]
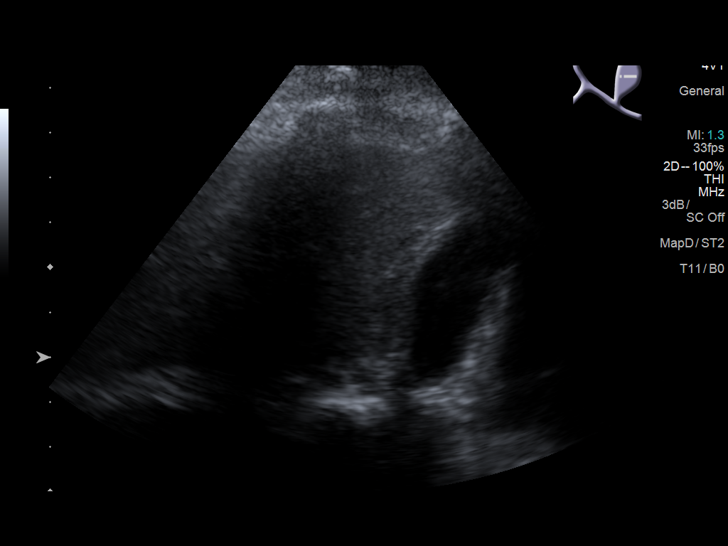
[im 17/37]
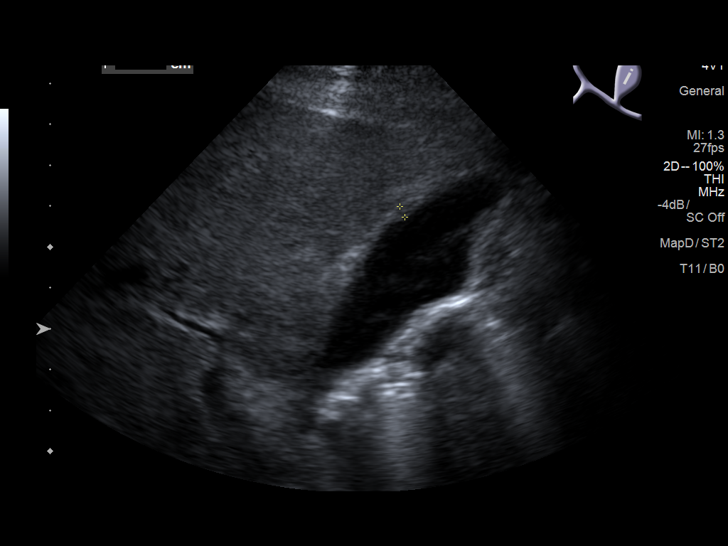
[im 20/37]
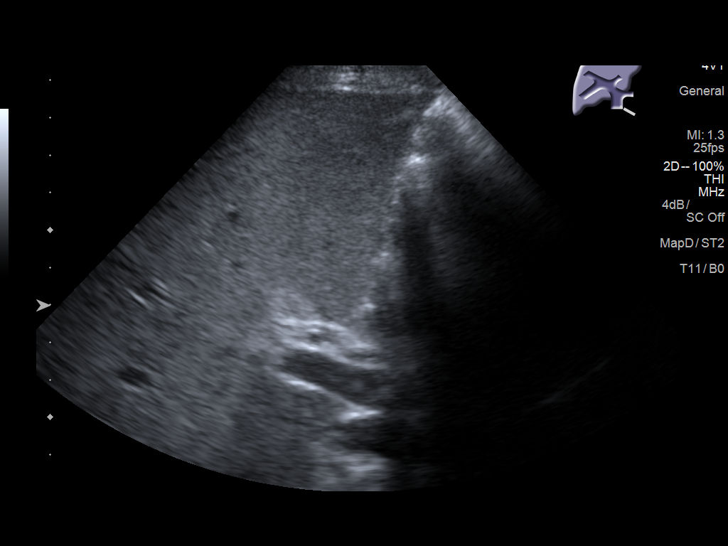
[im 23/37]
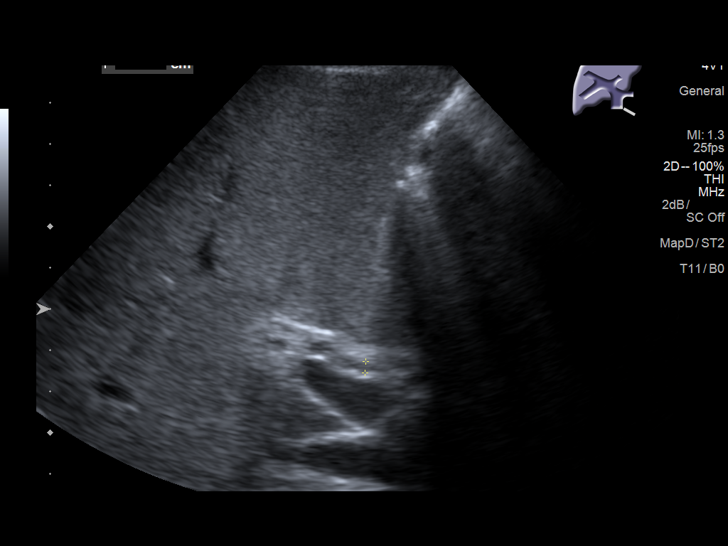
[im 25/37]
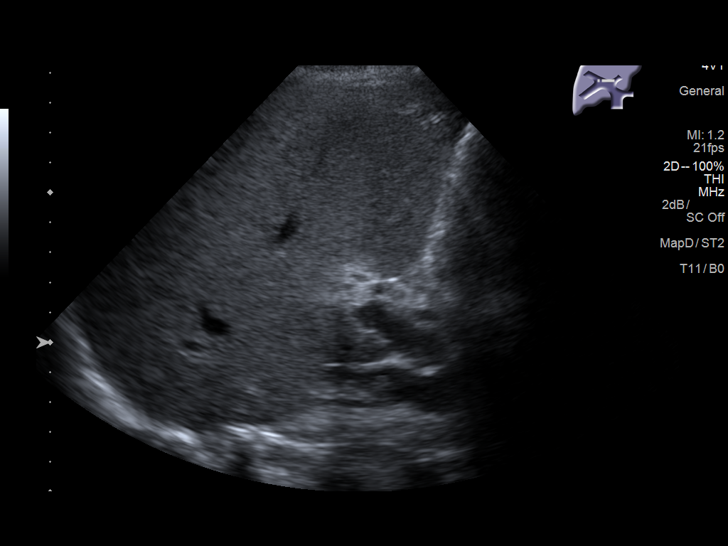
[im 28/37]
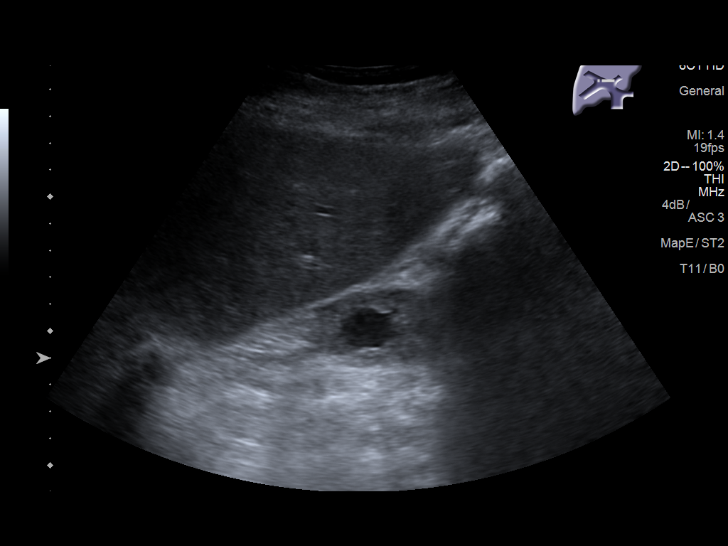
[im 31/37]
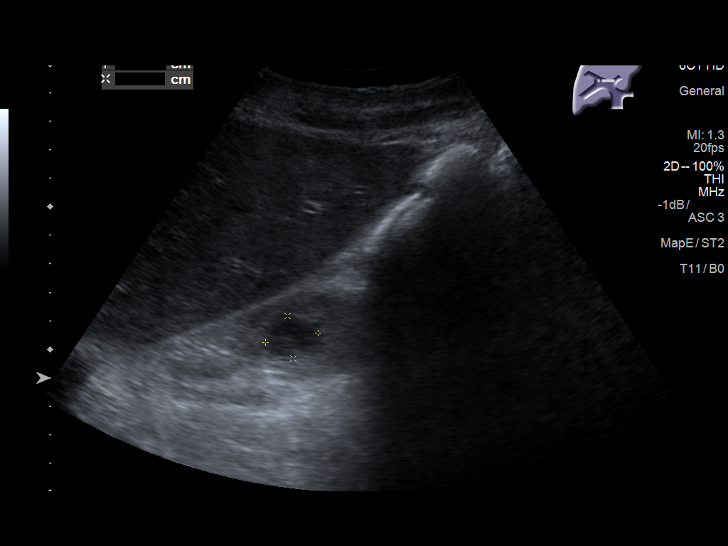
[im 34/37]
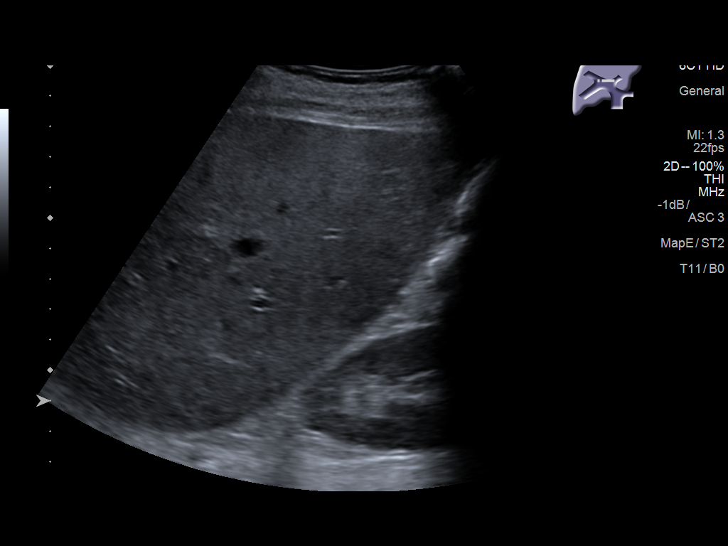
[im 37/37]
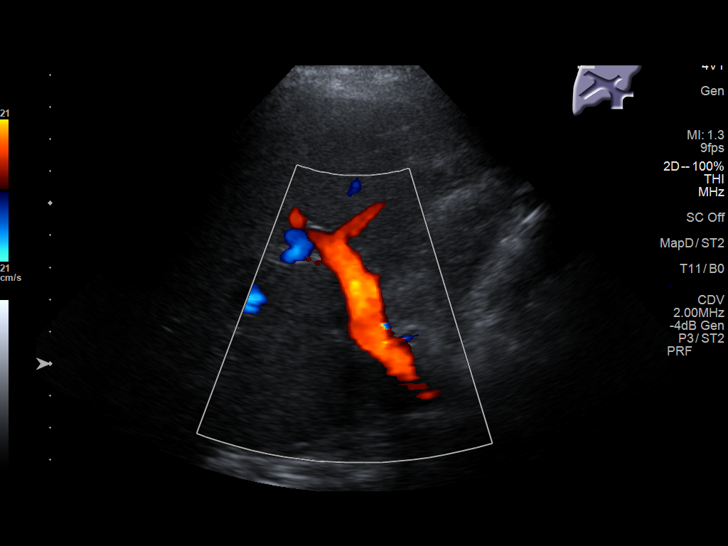

[14 of 25 positions shown; findings below may reference images not displayed]

FINDINGS: Gallbladder:

No gallstones or wall thickening visualized. No sonographic Murphy
sign noted by sonographer.

Common bile duct:

Diameter: 3 mm which is within normal limits.

Liver:

No focal lesion identified. Within normal limits in parenchymal
echogenicity. Portal vein is patent on color Doppler imaging with
normal direction of blood flow towards the liver.
IMPRESSION: No definite abnormality seen in the right upper quadrant of the
abdomen.

## 2020-09-21 ENCOUNTER — Ambulatory Visit: Payer: Medicare Other | Admitting: Infectious Disease

## 2021-09-21 IMAGING — DX DG FOOT COMPLETE 3+V*R*
3 series · 3 of 3 positions shown · non-contrast
Comparison: None.

CLINICAL DATA: Diabetic foot ulcers

EXAM:
RIGHT FOOT COMPLETE - 3+ VIEW

[foot ap]
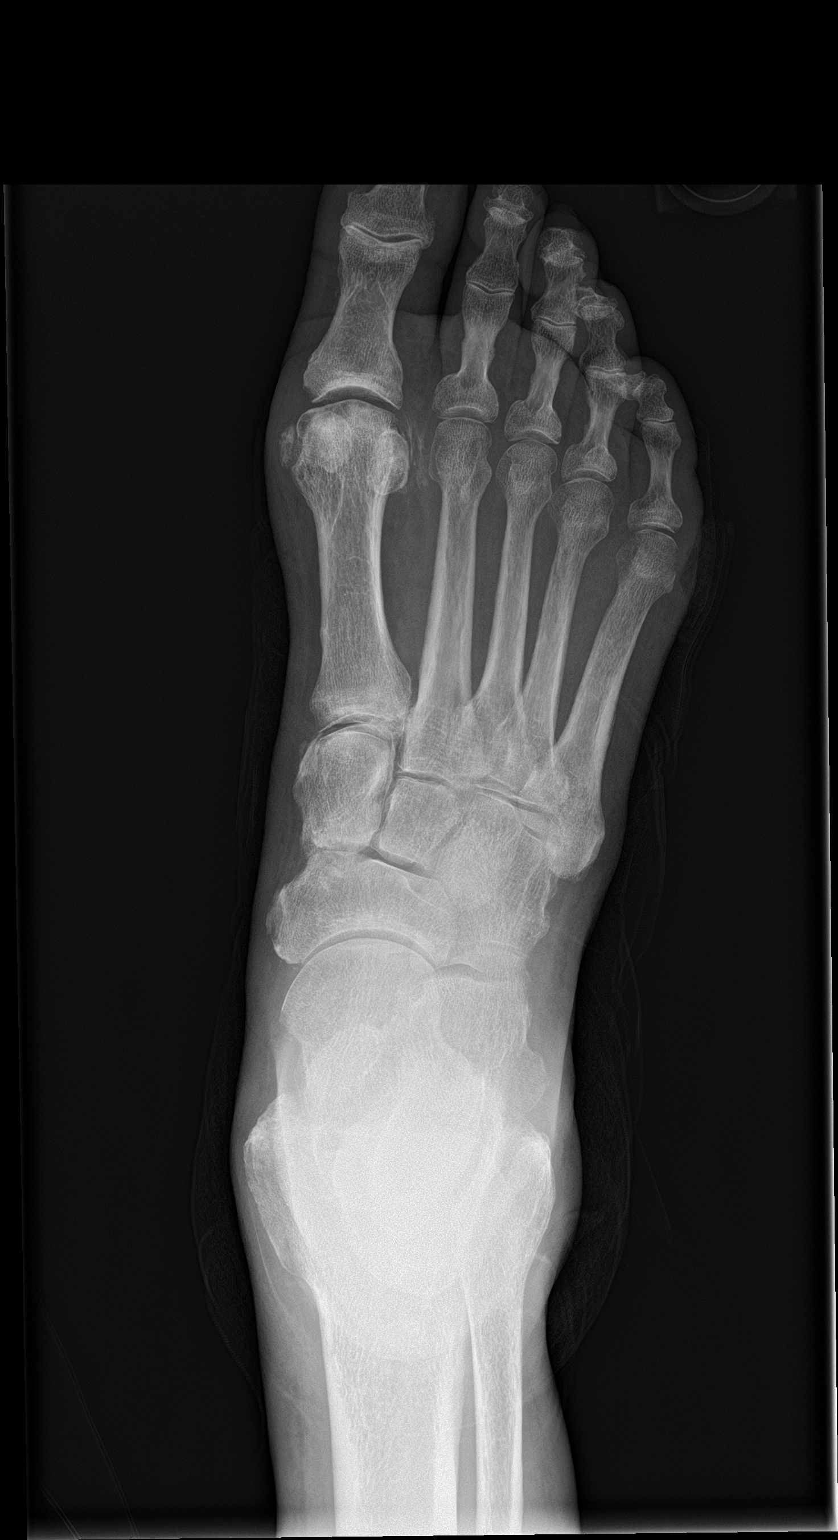

[foot obl]
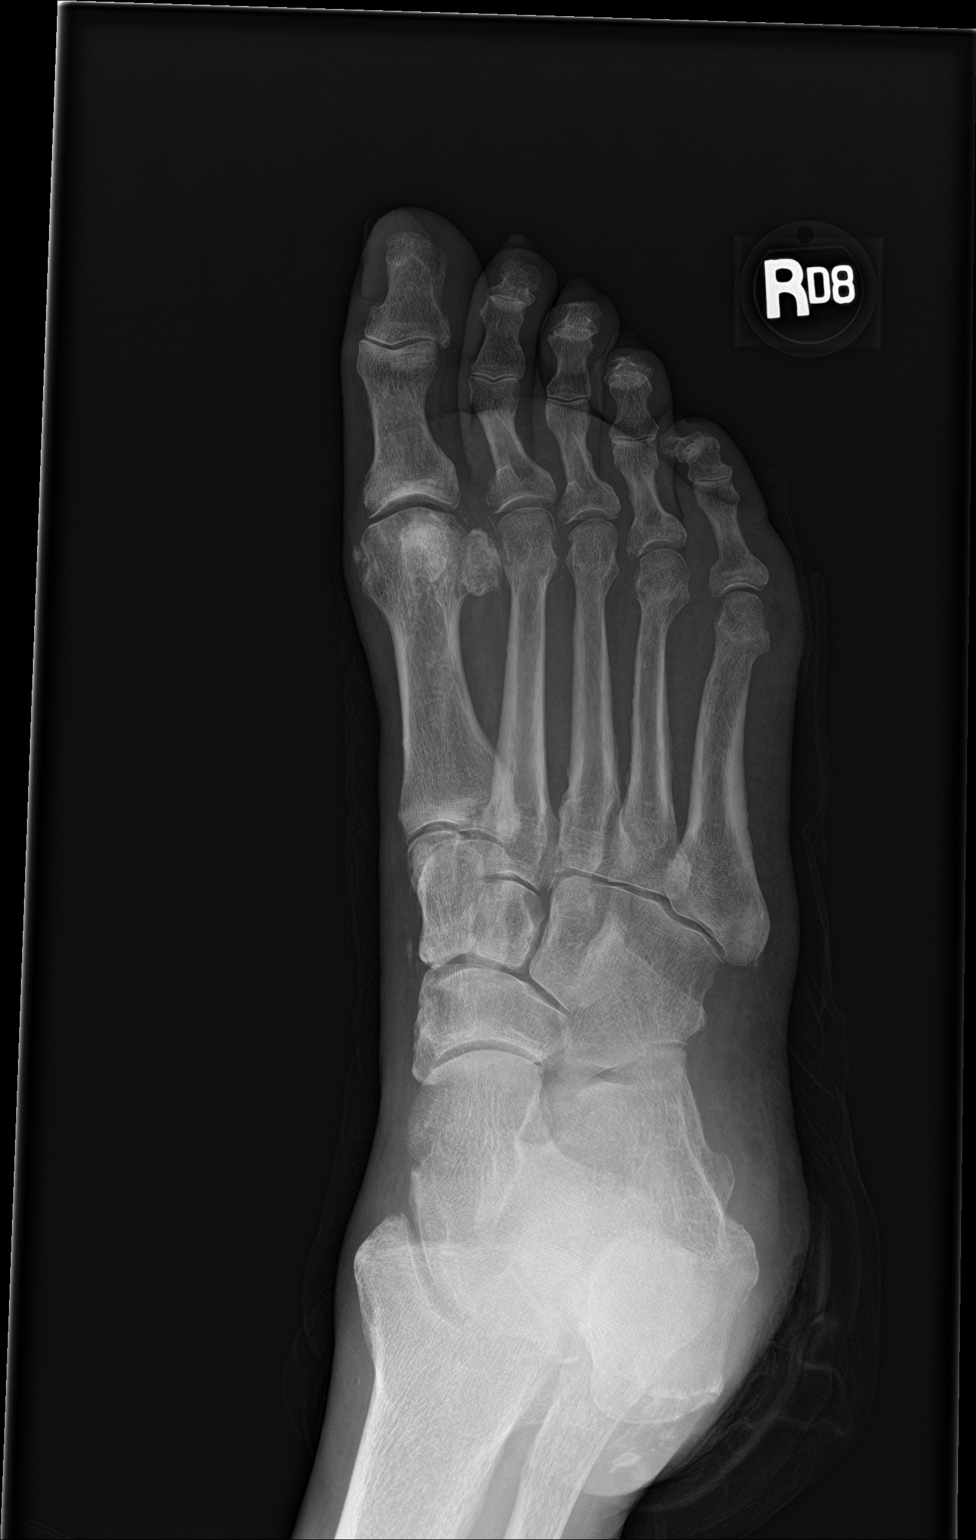

[foot lat]
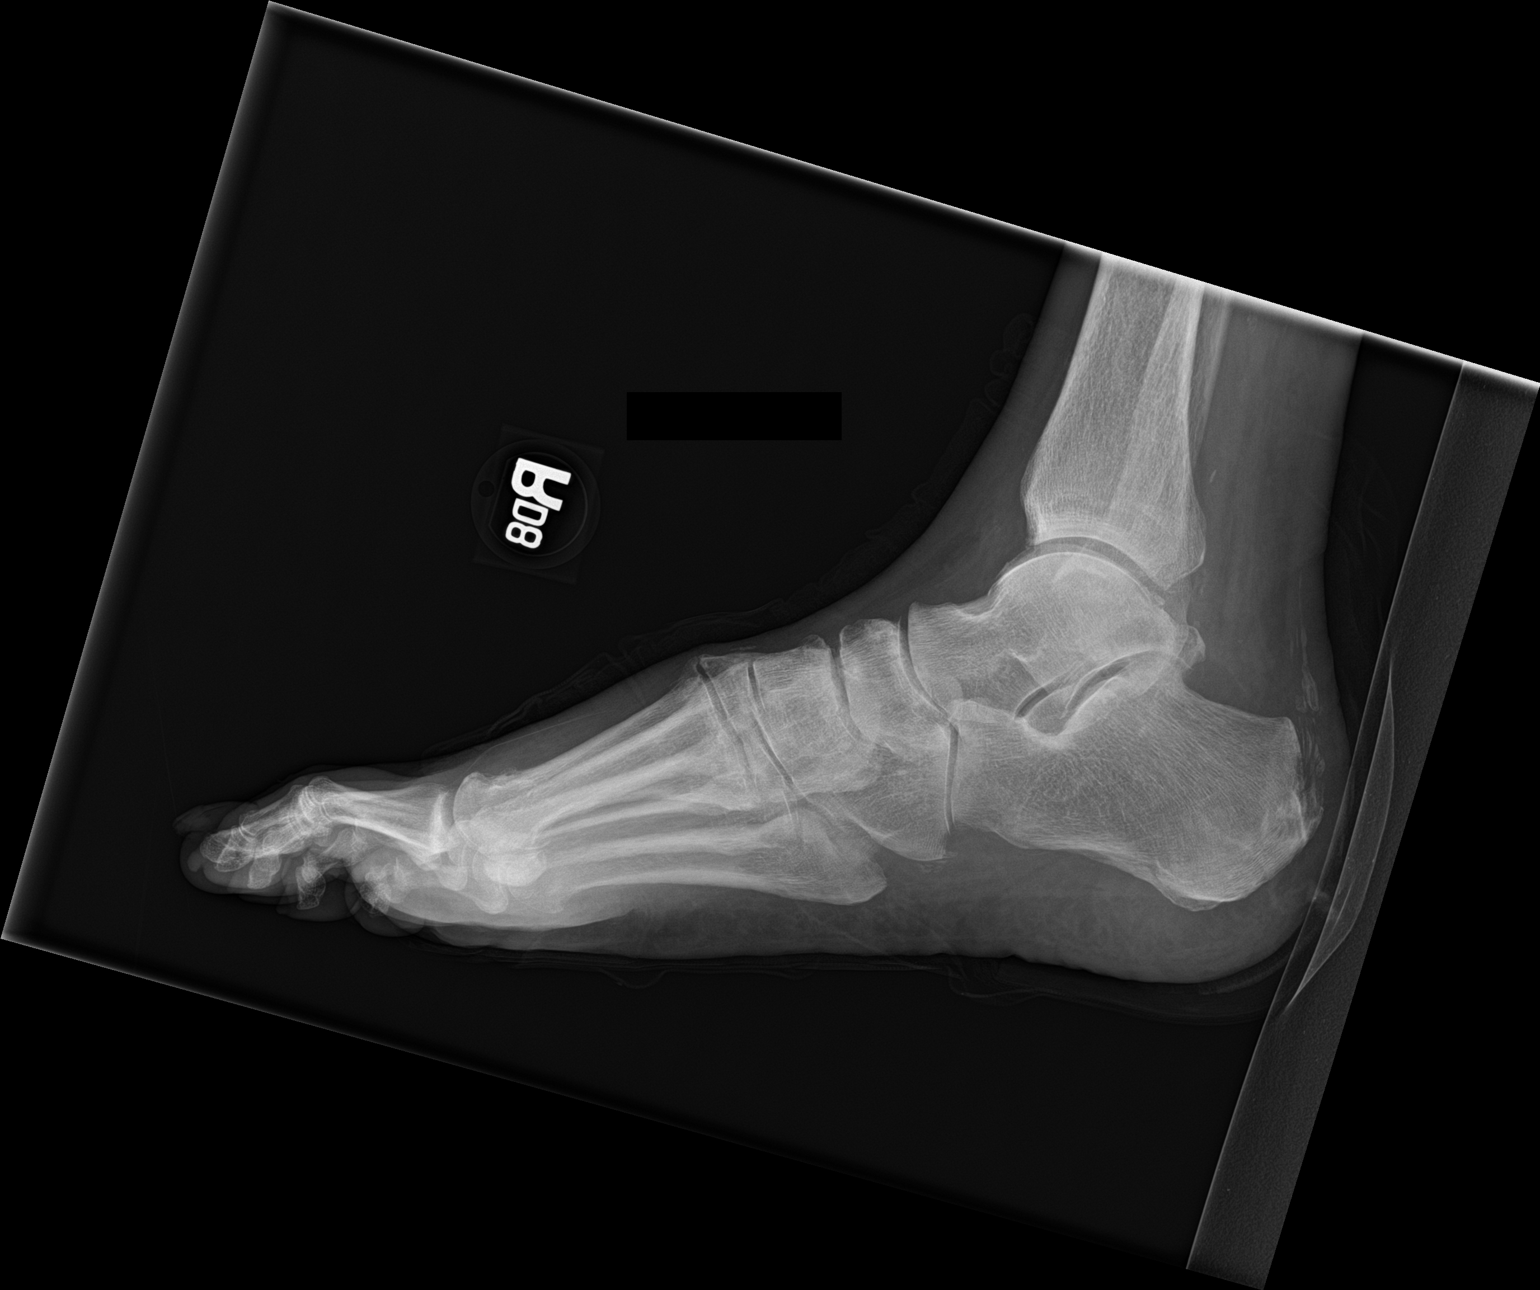

[3 of 3 positions shown; findings below may reference images not displayed]

FINDINGS: Soft tissue ulcer is noted posteriorly along the heel. Mild
calcifications are noted along the Achilles tendon insertion. Tarsal
degenerative changes are seen. No acute fracture or dislocation is
noted.
IMPRESSION: Soft tissue ulcer without bony destruction.
